# Patient Record
Sex: Female | Born: 1940 | Race: Black or African American | Hispanic: No | Smoking: Never smoker
Health system: Southern US, Community
[De-identification: ages and names within clinical notes are randomized; demographics above are authoritative.]

## PROBLEM LIST (undated history)

## (undated) DIAGNOSIS — I10 Essential (primary) hypertension: Secondary | ICD-10-CM

## (undated) DIAGNOSIS — C50919 Malignant neoplasm of unspecified site of unspecified female breast: Secondary | ICD-10-CM

## (undated) DIAGNOSIS — Z794 Long term (current) use of insulin: Secondary | ICD-10-CM

## (undated) DIAGNOSIS — F32A Depression, unspecified: Secondary | ICD-10-CM

## (undated) DIAGNOSIS — N6459 Other signs and symptoms in breast: Secondary | ICD-10-CM

## (undated) DIAGNOSIS — E785 Hyperlipidemia, unspecified: Secondary | ICD-10-CM

## (undated) DIAGNOSIS — Z923 Personal history of irradiation: Secondary | ICD-10-CM

## (undated) DIAGNOSIS — E119 Type 2 diabetes mellitus without complications: Secondary | ICD-10-CM

## (undated) DIAGNOSIS — D649 Anemia, unspecified: Secondary | ICD-10-CM

## (undated) DIAGNOSIS — F329 Major depressive disorder, single episode, unspecified: Secondary | ICD-10-CM

## (undated) DIAGNOSIS — N189 Chronic kidney disease, unspecified: Secondary | ICD-10-CM

## (undated) DIAGNOSIS — Z9221 Personal history of antineoplastic chemotherapy: Secondary | ICD-10-CM

## (undated) DIAGNOSIS — F419 Anxiety disorder, unspecified: Secondary | ICD-10-CM

## (undated) DIAGNOSIS — K219 Gastro-esophageal reflux disease without esophagitis: Secondary | ICD-10-CM

## (undated) DIAGNOSIS — K635 Polyp of colon: Secondary | ICD-10-CM

## (undated) DIAGNOSIS — IMO0001 Reserved for inherently not codable concepts without codable children: Secondary | ICD-10-CM

## (undated) HISTORY — DX: Anemia, unspecified: D64.9

## (undated) HISTORY — DX: Chronic kidney disease, unspecified: N18.9

## (undated) HISTORY — DX: Essential (primary) hypertension: I10

## (undated) HISTORY — DX: Other signs and symptoms in breast: N64.59

## (undated) HISTORY — DX: Polyp of colon: K63.5

## (undated) HISTORY — DX: Anxiety disorder, unspecified: F41.9

## (undated) HISTORY — DX: Depression, unspecified: F32.A

---
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
## 1998-06-18 ENCOUNTER — Ambulatory Visit (HOSPITAL_COMMUNITY): Admission: RE | Admit: 1998-06-18 | Discharge: 1998-06-18 | Payer: Self-pay | Admitting: Cardiology

## 1998-12-08 ENCOUNTER — Encounter: Admission: RE | Admit: 1998-12-08 | Discharge: 1998-12-08 | Payer: Self-pay | Admitting: Family Medicine

## 1999-01-07 ENCOUNTER — Encounter: Admission: RE | Admit: 1999-01-07 | Discharge: 1999-01-07 | Payer: Self-pay | Admitting: Family Medicine

## 1999-03-24 ENCOUNTER — Encounter: Admission: RE | Admit: 1999-03-24 | Discharge: 1999-03-24 | Payer: Self-pay | Admitting: Family Medicine

## 1999-04-25 ENCOUNTER — Encounter: Admission: RE | Admit: 1999-04-25 | Discharge: 1999-04-25 | Payer: Self-pay | Admitting: Family Medicine

## 1999-06-22 ENCOUNTER — Encounter: Admission: RE | Admit: 1999-06-22 | Discharge: 1999-06-22 | Payer: Self-pay | Admitting: Family Medicine

## 1999-07-22 ENCOUNTER — Encounter: Admission: RE | Admit: 1999-07-22 | Discharge: 1999-07-22 | Payer: Self-pay | Admitting: Family Medicine

## 1999-08-03 ENCOUNTER — Encounter: Admission: RE | Admit: 1999-08-03 | Discharge: 1999-08-03 | Payer: Self-pay | Admitting: Family Medicine

## 1999-08-22 ENCOUNTER — Encounter: Admission: RE | Admit: 1999-08-22 | Discharge: 1999-08-22 | Payer: Self-pay | Admitting: Family Medicine

## 1999-09-30 ENCOUNTER — Encounter: Admission: RE | Admit: 1999-09-30 | Discharge: 1999-09-30 | Payer: Self-pay | Admitting: Family Medicine

## 2000-01-04 ENCOUNTER — Encounter: Admission: RE | Admit: 2000-01-04 | Discharge: 2000-01-04 | Payer: Self-pay | Admitting: *Deleted

## 2000-01-25 ENCOUNTER — Encounter: Payer: Self-pay | Admitting: Emergency Medicine

## 2000-01-25 ENCOUNTER — Emergency Department (HOSPITAL_COMMUNITY): Admission: EM | Admit: 2000-01-25 | Discharge: 2000-01-25 | Payer: Self-pay | Admitting: Emergency Medicine

## 2000-05-24 ENCOUNTER — Encounter: Admission: RE | Admit: 2000-05-24 | Discharge: 2000-05-24 | Payer: Self-pay | Admitting: Family Medicine

## 2000-06-19 ENCOUNTER — Encounter: Admission: RE | Admit: 2000-06-19 | Discharge: 2000-06-19 | Payer: Self-pay | Admitting: *Deleted

## 2000-06-19 ENCOUNTER — Encounter: Payer: Self-pay | Admitting: *Deleted

## 2000-08-10 ENCOUNTER — Encounter: Admission: RE | Admit: 2000-08-10 | Discharge: 2000-08-10 | Payer: Self-pay | Admitting: Family Medicine

## 2000-10-22 ENCOUNTER — Encounter: Admission: RE | Admit: 2000-10-22 | Discharge: 2000-10-22 | Payer: Self-pay | Admitting: Family Medicine

## 2001-01-30 ENCOUNTER — Encounter: Admission: RE | Admit: 2001-01-30 | Discharge: 2001-01-30 | Payer: Self-pay | Admitting: Family Medicine

## 2001-05-29 ENCOUNTER — Encounter: Admission: RE | Admit: 2001-05-29 | Discharge: 2001-05-29 | Payer: Self-pay | Admitting: Family Medicine

## 2001-06-04 ENCOUNTER — Encounter: Admission: RE | Admit: 2001-06-04 | Discharge: 2001-06-04 | Payer: Self-pay | Admitting: Family Medicine

## 2001-06-19 ENCOUNTER — Encounter: Admission: RE | Admit: 2001-06-19 | Discharge: 2001-06-19 | Payer: Self-pay | Admitting: Family Medicine

## 2001-09-18 ENCOUNTER — Encounter: Admission: RE | Admit: 2001-09-18 | Discharge: 2001-09-18 | Payer: Self-pay | Admitting: Family Medicine

## 2001-10-03 ENCOUNTER — Encounter: Payer: Self-pay | Admitting: *Deleted

## 2001-10-03 ENCOUNTER — Encounter: Admission: RE | Admit: 2001-10-03 | Discharge: 2001-10-03 | Payer: Self-pay | Admitting: *Deleted

## 2001-10-17 ENCOUNTER — Encounter: Admission: RE | Admit: 2001-10-17 | Discharge: 2001-10-17 | Payer: Self-pay | Admitting: Family Medicine

## 2001-12-10 ENCOUNTER — Encounter: Admission: RE | Admit: 2001-12-10 | Discharge: 2001-12-10 | Payer: Self-pay | Admitting: Family Medicine

## 2002-01-03 ENCOUNTER — Ambulatory Visit (HOSPITAL_COMMUNITY): Admission: RE | Admit: 2002-01-03 | Discharge: 2002-01-03 | Payer: Self-pay | Admitting: Family Medicine

## 2002-01-03 ENCOUNTER — Encounter: Admission: RE | Admit: 2002-01-03 | Discharge: 2002-01-03 | Payer: Self-pay | Admitting: Family Medicine

## 2002-04-03 ENCOUNTER — Emergency Department (HOSPITAL_COMMUNITY): Admission: EM | Admit: 2002-04-03 | Discharge: 2002-04-03 | Payer: Self-pay | Admitting: Emergency Medicine

## 2002-06-05 ENCOUNTER — Encounter: Admission: RE | Admit: 2002-06-05 | Discharge: 2002-06-05 | Payer: Self-pay | Admitting: Family Medicine

## 2002-07-04 ENCOUNTER — Encounter: Admission: RE | Admit: 2002-07-04 | Discharge: 2002-07-04 | Payer: Self-pay | Admitting: Family Medicine

## 2002-08-02 ENCOUNTER — Emergency Department (HOSPITAL_COMMUNITY): Admission: EM | Admit: 2002-08-02 | Discharge: 2002-08-02 | Payer: Self-pay | Admitting: Emergency Medicine

## 2002-08-06 ENCOUNTER — Encounter: Admission: RE | Admit: 2002-08-06 | Discharge: 2002-08-06 | Payer: Self-pay | Admitting: Family Medicine

## 2002-08-22 ENCOUNTER — Encounter: Admission: RE | Admit: 2002-08-22 | Discharge: 2002-08-22 | Payer: Self-pay | Admitting: Family Medicine

## 2002-12-05 ENCOUNTER — Encounter: Admission: RE | Admit: 2002-12-05 | Discharge: 2002-12-05 | Payer: Self-pay | Admitting: Family Medicine

## 2002-12-17 ENCOUNTER — Encounter: Admission: RE | Admit: 2002-12-17 | Discharge: 2002-12-17 | Payer: Self-pay | Admitting: Sports Medicine

## 2002-12-17 ENCOUNTER — Encounter: Payer: Self-pay | Admitting: Sports Medicine

## 2003-01-12 ENCOUNTER — Encounter: Admission: RE | Admit: 2003-01-12 | Discharge: 2003-01-12 | Payer: Self-pay | Admitting: Family Medicine

## 2003-04-01 ENCOUNTER — Encounter: Admission: RE | Admit: 2003-04-01 | Discharge: 2003-04-01 | Payer: Self-pay | Admitting: Family Medicine

## 2003-06-02 ENCOUNTER — Encounter: Admission: RE | Admit: 2003-06-02 | Discharge: 2003-06-02 | Payer: Self-pay | Admitting: Family Medicine

## 2003-06-26 ENCOUNTER — Encounter: Admission: RE | Admit: 2003-06-26 | Discharge: 2003-06-26 | Payer: Self-pay | Admitting: Family Medicine

## 2003-08-18 ENCOUNTER — Encounter: Admission: RE | Admit: 2003-08-18 | Discharge: 2003-08-18 | Payer: Self-pay | Admitting: Sports Medicine

## 2003-09-14 ENCOUNTER — Encounter: Admission: RE | Admit: 2003-09-14 | Discharge: 2003-09-14 | Payer: Self-pay | Admitting: Family Medicine

## 2003-09-21 ENCOUNTER — Ambulatory Visit (HOSPITAL_COMMUNITY): Admission: RE | Admit: 2003-09-21 | Discharge: 2003-09-21 | Payer: Self-pay | Admitting: Internal Medicine

## 2003-12-07 ENCOUNTER — Encounter: Admission: RE | Admit: 2003-12-07 | Discharge: 2003-12-07 | Payer: Self-pay | Admitting: Family Medicine

## 2004-02-10 ENCOUNTER — Emergency Department (HOSPITAL_COMMUNITY): Admission: EM | Admit: 2004-02-10 | Discharge: 2004-02-10 | Payer: Self-pay | Admitting: Emergency Medicine

## 2004-03-02 ENCOUNTER — Encounter: Admission: RE | Admit: 2004-03-02 | Discharge: 2004-03-02 | Payer: Self-pay | Admitting: Family Medicine

## 2004-03-11 ENCOUNTER — Encounter: Admission: RE | Admit: 2004-03-11 | Discharge: 2004-03-11 | Payer: Self-pay | Admitting: Sports Medicine

## 2004-07-04 ENCOUNTER — Ambulatory Visit: Payer: Self-pay | Admitting: Family Medicine

## 2004-07-19 ENCOUNTER — Ambulatory Visit: Payer: Self-pay | Admitting: Sports Medicine

## 2004-08-12 ENCOUNTER — Ambulatory Visit (HOSPITAL_COMMUNITY): Admission: RE | Admit: 2004-08-12 | Discharge: 2004-08-12 | Payer: Self-pay | Admitting: Gastroenterology

## 2004-08-12 ENCOUNTER — Encounter (INDEPENDENT_AMBULATORY_CARE_PROVIDER_SITE_OTHER): Payer: Self-pay | Admitting: Specialist

## 2004-08-12 LAB — HM COLONOSCOPY

## 2004-09-01 ENCOUNTER — Encounter (INDEPENDENT_AMBULATORY_CARE_PROVIDER_SITE_OTHER): Payer: Self-pay | Admitting: *Deleted

## 2004-09-05 ENCOUNTER — Ambulatory Visit: Payer: Self-pay | Admitting: Family Medicine

## 2004-09-13 ENCOUNTER — Encounter: Payer: Self-pay | Admitting: Family Medicine

## 2004-09-13 ENCOUNTER — Other Ambulatory Visit: Admission: RE | Admit: 2004-09-13 | Discharge: 2004-09-13 | Payer: Self-pay | Admitting: Family Medicine

## 2004-09-13 ENCOUNTER — Ambulatory Visit: Payer: Self-pay | Admitting: Family Medicine

## 2004-09-28 ENCOUNTER — Encounter: Admission: RE | Admit: 2004-09-28 | Discharge: 2004-09-28 | Payer: Self-pay | Admitting: Family Medicine

## 2004-10-10 ENCOUNTER — Ambulatory Visit: Payer: Self-pay | Admitting: Family Medicine

## 2004-11-04 ENCOUNTER — Ambulatory Visit: Payer: Self-pay | Admitting: Family Medicine

## 2005-01-15 ENCOUNTER — Emergency Department (HOSPITAL_COMMUNITY): Admission: EM | Admit: 2005-01-15 | Discharge: 2005-01-15 | Payer: Self-pay | Admitting: Emergency Medicine

## 2005-01-16 ENCOUNTER — Ambulatory Visit: Payer: Self-pay | Admitting: Family Medicine

## 2005-02-20 ENCOUNTER — Ambulatory Visit: Payer: Self-pay | Admitting: Family Medicine

## 2005-05-04 ENCOUNTER — Ambulatory Visit: Payer: Self-pay | Admitting: Sports Medicine

## 2005-05-05 ENCOUNTER — Ambulatory Visit: Payer: Self-pay | Admitting: Family Medicine

## 2005-06-26 ENCOUNTER — Ambulatory Visit: Payer: Self-pay | Admitting: Family Medicine

## 2005-07-28 ENCOUNTER — Ambulatory Visit: Payer: Self-pay | Admitting: Family Medicine

## 2005-08-18 ENCOUNTER — Ambulatory Visit: Payer: Self-pay | Admitting: Family Medicine

## 2005-11-16 ENCOUNTER — Encounter: Admission: RE | Admit: 2005-11-16 | Discharge: 2005-11-16 | Payer: Self-pay | Admitting: Internal Medicine

## 2005-11-17 ENCOUNTER — Ambulatory Visit: Payer: Self-pay | Admitting: Sports Medicine

## 2005-12-11 ENCOUNTER — Ambulatory Visit: Payer: Self-pay | Admitting: Family Medicine

## 2006-01-31 ENCOUNTER — Encounter: Payer: Self-pay | Admitting: Cardiology

## 2006-03-01 ENCOUNTER — Ambulatory Visit: Payer: Self-pay | Admitting: Family Medicine

## 2006-07-02 ENCOUNTER — Ambulatory Visit: Payer: Self-pay | Admitting: Sports Medicine

## 2006-07-05 ENCOUNTER — Ambulatory Visit: Payer: Self-pay | Admitting: Family Medicine

## 2006-07-05 ENCOUNTER — Ambulatory Visit (HOSPITAL_COMMUNITY): Admission: RE | Admit: 2006-07-05 | Discharge: 2006-07-05 | Payer: Self-pay | Admitting: Family Medicine

## 2006-07-07 ENCOUNTER — Emergency Department (HOSPITAL_COMMUNITY): Admission: EM | Admit: 2006-07-07 | Discharge: 2006-07-07 | Payer: Self-pay | Admitting: Family Medicine

## 2006-07-15 ENCOUNTER — Observation Stay (HOSPITAL_COMMUNITY): Admission: EM | Admit: 2006-07-15 | Discharge: 2006-07-16 | Payer: Self-pay | Admitting: Emergency Medicine

## 2006-07-15 ENCOUNTER — Ambulatory Visit: Payer: Self-pay | Admitting: Sports Medicine

## 2006-07-20 ENCOUNTER — Ambulatory Visit: Payer: Self-pay | Admitting: Family Medicine

## 2006-08-17 ENCOUNTER — Ambulatory Visit (HOSPITAL_COMMUNITY): Admission: RE | Admit: 2006-08-17 | Discharge: 2006-08-17 | Payer: Self-pay | Admitting: Sports Medicine

## 2006-08-17 ENCOUNTER — Ambulatory Visit: Payer: Self-pay | Admitting: Internal Medicine

## 2006-08-17 ENCOUNTER — Encounter: Payer: Self-pay | Admitting: Internal Medicine

## 2006-08-17 ENCOUNTER — Ambulatory Visit: Payer: Self-pay | Admitting: Sports Medicine

## 2006-09-11 ENCOUNTER — Ambulatory Visit: Payer: Self-pay | Admitting: Sports Medicine

## 2006-11-29 DIAGNOSIS — I871 Compression of vein: Secondary | ICD-10-CM

## 2006-11-29 DIAGNOSIS — I10 Essential (primary) hypertension: Secondary | ICD-10-CM

## 2006-11-29 DIAGNOSIS — E669 Obesity, unspecified: Secondary | ICD-10-CM

## 2006-11-29 DIAGNOSIS — E1159 Type 2 diabetes mellitus with other circulatory complications: Secondary | ICD-10-CM

## 2006-11-29 DIAGNOSIS — L2089 Other atopic dermatitis: Secondary | ICD-10-CM

## 2006-11-29 DIAGNOSIS — R12 Heartburn: Secondary | ICD-10-CM | POA: Insufficient documentation

## 2006-11-29 DIAGNOSIS — IMO0002 Reserved for concepts with insufficient information to code with codable children: Secondary | ICD-10-CM | POA: Insufficient documentation

## 2006-11-29 DIAGNOSIS — E1169 Type 2 diabetes mellitus with other specified complication: Secondary | ICD-10-CM | POA: Insufficient documentation

## 2006-11-29 DIAGNOSIS — E785 Hyperlipidemia, unspecified: Secondary | ICD-10-CM

## 2006-11-29 DIAGNOSIS — E1165 Type 2 diabetes mellitus with hyperglycemia: Secondary | ICD-10-CM

## 2006-11-29 HISTORY — DX: Essential (primary) hypertension: I10

## 2006-11-30 ENCOUNTER — Encounter (INDEPENDENT_AMBULATORY_CARE_PROVIDER_SITE_OTHER): Payer: Self-pay | Admitting: *Deleted

## 2007-02-05 ENCOUNTER — Ambulatory Visit: Payer: Self-pay | Admitting: Family Medicine

## 2007-02-05 ENCOUNTER — Encounter (INDEPENDENT_AMBULATORY_CARE_PROVIDER_SITE_OTHER): Payer: Self-pay | Admitting: Family Medicine

## 2007-02-05 LAB — CONVERTED CEMR LAB
Calcium: 10.1 mg/dL (ref 8.4–10.5)
Hgb A1c MFr Bld: >14 %
Sodium: 135 meq/L (ref 135–145)

## 2007-02-20 ENCOUNTER — Encounter (INDEPENDENT_AMBULATORY_CARE_PROVIDER_SITE_OTHER): Payer: Self-pay | Admitting: *Deleted

## 2007-07-03 ENCOUNTER — Ambulatory Visit: Payer: Self-pay | Admitting: Family Medicine

## 2007-07-05 ENCOUNTER — Encounter (INDEPENDENT_AMBULATORY_CARE_PROVIDER_SITE_OTHER): Payer: Self-pay | Admitting: Family Medicine

## 2007-07-05 ENCOUNTER — Ambulatory Visit: Payer: Self-pay | Admitting: Family Medicine

## 2007-07-08 ENCOUNTER — Encounter (INDEPENDENT_AMBULATORY_CARE_PROVIDER_SITE_OTHER): Payer: Self-pay | Admitting: Family Medicine

## 2007-07-08 LAB — CONVERTED CEMR LAB
Cholesterol: 275 mg/dL — ABNORMAL HIGH (ref 0–200)
HDL: 62 mg/dL (ref >39)
Total CHOL/HDL Ratio: 4.4

## 2007-07-31 ENCOUNTER — Ambulatory Visit: Payer: Self-pay | Admitting: Family Medicine

## 2007-08-28 ENCOUNTER — Ambulatory Visit: Payer: Self-pay | Admitting: Family Medicine

## 2007-08-28 ENCOUNTER — Encounter (INDEPENDENT_AMBULATORY_CARE_PROVIDER_SITE_OTHER): Payer: Self-pay | Admitting: Family Medicine

## 2007-08-28 DIAGNOSIS — R011 Cardiac murmur, unspecified: Secondary | ICD-10-CM

## 2007-08-31 LAB — CONVERTED CEMR LAB
C-Peptide: 0.83 ng/mL (ref 0.80–3.90)
Hemoglobin: 15.5 g/dL — ABNORMAL HIGH (ref 12.0–15.0)
MCHC: 34.5 g/dL (ref 30.0–36.0)
MCV: 81.8 fL (ref 78.0–100.0)
RBC: 5.49 M/uL — ABNORMAL HIGH (ref 3.87–5.11)

## 2007-10-06 ENCOUNTER — Emergency Department (HOSPITAL_COMMUNITY): Admission: EM | Admit: 2007-10-06 | Discharge: 2007-10-06 | Payer: Self-pay | Admitting: Emergency Medicine

## 2007-10-08 ENCOUNTER — Ambulatory Visit: Payer: Self-pay | Admitting: Family Medicine

## 2007-10-08 DIAGNOSIS — S92919A Unspecified fracture of unspecified toe(s), initial encounter for closed fracture: Secondary | ICD-10-CM | POA: Insufficient documentation

## 2007-12-09 ENCOUNTER — Ambulatory Visit: Payer: Self-pay | Admitting: Family Medicine

## 2007-12-16 ENCOUNTER — Encounter: Payer: Self-pay | Admitting: Family Medicine

## 2007-12-16 ENCOUNTER — Ambulatory Visit: Payer: Self-pay | Admitting: Family Medicine

## 2007-12-16 LAB — CONVERTED CEMR LAB
HDL: 55 mg/dL (ref >39)
LDL Cholesterol: 82 mg/dL (ref 0–99)
Triglycerides: 220 mg/dL — ABNORMAL HIGH (ref <150)
VLDL: 44 mg/dL — ABNORMAL HIGH (ref 0–40)

## 2008-02-03 ENCOUNTER — Encounter: Admission: RE | Admit: 2008-02-03 | Discharge: 2008-02-03 | Payer: Self-pay | Admitting: Family Medicine

## 2008-02-21 ENCOUNTER — Encounter: Admission: RE | Admit: 2008-02-21 | Discharge: 2008-02-21 | Payer: Self-pay | Admitting: Family Medicine

## 2008-04-13 ENCOUNTER — Ambulatory Visit: Payer: Self-pay | Admitting: Sports Medicine

## 2008-04-13 ENCOUNTER — Encounter: Payer: Self-pay | Admitting: Family Medicine

## 2008-04-13 DIAGNOSIS — N63 Unspecified lump in unspecified breast: Secondary | ICD-10-CM

## 2008-04-13 DIAGNOSIS — F17200 Nicotine dependence, unspecified, uncomplicated: Secondary | ICD-10-CM | POA: Insufficient documentation

## 2008-04-13 LAB — CONVERTED CEMR LAB: HDL goal, serum: 40 mg/dL

## 2008-04-14 LAB — CONVERTED CEMR LAB: Pap Smear: NORMAL

## 2008-04-29 ENCOUNTER — Telehealth (INDEPENDENT_AMBULATORY_CARE_PROVIDER_SITE_OTHER): Payer: Self-pay | Admitting: Family Medicine

## 2008-05-04 ENCOUNTER — Encounter: Admission: RE | Admit: 2008-05-04 | Discharge: 2008-05-04 | Payer: Self-pay | Admitting: Surgery

## 2008-05-05 ENCOUNTER — Encounter: Admission: RE | Admit: 2008-05-05 | Discharge: 2008-05-05 | Payer: Self-pay | Admitting: Surgery

## 2008-05-05 ENCOUNTER — Ambulatory Visit (HOSPITAL_BASED_OUTPATIENT_CLINIC_OR_DEPARTMENT_OTHER): Admission: RE | Admit: 2008-05-05 | Discharge: 2008-05-05 | Payer: Self-pay | Admitting: Surgery

## 2008-05-05 ENCOUNTER — Encounter (INDEPENDENT_AMBULATORY_CARE_PROVIDER_SITE_OTHER): Payer: Self-pay | Admitting: Surgery

## 2008-05-05 HISTORY — PX: BREAST LUMPECTOMY: SHX2

## 2008-07-06 ENCOUNTER — Ambulatory Visit: Payer: Self-pay | Admitting: Family Medicine

## 2008-08-17 ENCOUNTER — Ambulatory Visit: Payer: Self-pay | Admitting: Family Medicine

## 2008-10-09 ENCOUNTER — Telehealth: Payer: Self-pay | Admitting: *Deleted

## 2009-06-02 ENCOUNTER — Ambulatory Visit: Payer: Self-pay | Admitting: Family Medicine

## 2009-06-02 LAB — CONVERTED CEMR LAB: Hgb A1c MFr Bld: 12.3 %

## 2009-08-30 ENCOUNTER — Ambulatory Visit: Payer: Self-pay | Admitting: Family Medicine

## 2009-09-01 ENCOUNTER — Encounter (INDEPENDENT_AMBULATORY_CARE_PROVIDER_SITE_OTHER): Payer: Self-pay

## 2009-09-03 ENCOUNTER — Encounter: Payer: Self-pay | Admitting: Family Medicine

## 2009-09-03 ENCOUNTER — Ambulatory Visit: Payer: Self-pay | Admitting: Family Medicine

## 2009-09-05 LAB — CONVERTED CEMR LAB
AST: 11 units/L (ref 0–37)
Albumin: 4.1 g/dL (ref 3.5–5.2)
Alkaline Phosphatase: 91 units/L (ref 39–117)
BUN: 17 mg/dL (ref 6–23)
Creatinine, Ser: 0.95 mg/dL (ref 0.40–1.20)
HDL: 61 mg/dL (ref >39)
LDL Cholesterol: 180 mg/dL — ABNORMAL HIGH (ref 0–99)
Potassium: 4.3 meq/L (ref 3.5–5.3)
Total Bilirubin: 0.4 mg/dL (ref 0.3–1.2)
Total CHOL/HDL Ratio: 4.4
VLDL: 25 mg/dL (ref 0–40)

## 2009-12-23 ENCOUNTER — Ambulatory Visit: Payer: Self-pay | Admitting: Family Medicine

## 2010-03-03 ENCOUNTER — Ambulatory Visit: Payer: Self-pay | Admitting: Family Medicine

## 2010-03-03 LAB — CONVERTED CEMR LAB: Hgb A1c MFr Bld: 11.1 %

## 2010-06-07 ENCOUNTER — Encounter (INDEPENDENT_AMBULATORY_CARE_PROVIDER_SITE_OTHER): Payer: Self-pay | Admitting: Neurology

## 2010-06-07 ENCOUNTER — Ambulatory Visit: Admission: RE | Admit: 2010-06-07 | Discharge: 2010-06-07 | Payer: Self-pay | Admitting: Neurology

## 2010-06-15 ENCOUNTER — Ambulatory Visit: Payer: Self-pay | Admitting: Family Medicine

## 2010-09-06 ENCOUNTER — Telehealth: Payer: Self-pay | Admitting: Family Medicine

## 2010-09-15 ENCOUNTER — Encounter: Payer: Self-pay | Admitting: Family Medicine

## 2010-09-15 ENCOUNTER — Ambulatory Visit: Payer: Self-pay | Admitting: Family Medicine

## 2010-09-21 ENCOUNTER — Ambulatory Visit: Payer: Self-pay | Admitting: Family Medicine

## 2010-10-17 ENCOUNTER — Telehealth: Payer: Self-pay | Admitting: Family Medicine

## 2010-10-23 ENCOUNTER — Encounter: Payer: Self-pay | Admitting: Family Medicine

## 2010-10-26 ENCOUNTER — Encounter (INDEPENDENT_AMBULATORY_CARE_PROVIDER_SITE_OTHER): Payer: Self-pay | Admitting: *Deleted

## 2010-11-01 NOTE — Progress Notes (Signed)
Summary: resch  Phone Note Call from Patient   Caller: Patient Summary of Call: pt called to resch her appt today-  Initial call taken by: Audie Clear,  September 06, 2010 10:38 AM

## 2010-11-01 NOTE — Assessment & Plan Note (Signed)
Summary: f/up,tcb   Vital Signs:  Patient profile:   70 year old female Height:      61.75 inches Weight:      187 pounds BMI:     34.60 BSA:     1.85 Temp:     97.9 degrees F Pulse rate:   92 / minute BP sitting:   145 / 83  Vitals Entered By: Christen Bame CMA (March 03, 2010 11:42 AM) CC: f/u Is Patient Diabetic? Yes Did you bring your meter with you today? No Pain Assessment Patient in pain? no        Primary Care Provider:  Mariana Arn  MD  CC:  f/u.  History of Present Illness: 1) HTN - 145/83.  Denies any CP, HA, SOB, or blurred vision or LE edema. Taking metoprolol, norvasc, clonidine, lisinopril, HCTZ w/o side effects. Does not monitor salt intake, walks 3 minuteson treadmill 3-4 times per week at most.  2)  DM - Fasting CBGs 80's-200's (checks first thing in the morning).  Takes 50 units Lantus qAM, though she should be on 60 units - I have repeatedly told her this.  Also takes Metformin and Glipizide but adherence has, and continues to be an issue.  A1C 12.3 at last visit, 11.1 today.  Has been referred to Dr. Valentina Lucks for pharmacy clinic on two occasions but did not make appointment either time.  Walks 15-20 minutes 2 x week. Likes fried foods and sweets - says it will be too expensive to change her diet and eat "diet foods". Today denies polyuria, polydipsia, vision change. Reports that she saw the opthalmologist 1 year ago and had a "good report"  3) HLD: On statin, but has not taken in at least one month. . Last FLP 03/09 - total cholesterol 181, trig 220, HDL 55, LDL 82. Likes fried foods and sweets.   Habits & Providers  Alcohol-Tobacco-Diet     Tobacco Status: never  Current Medications (verified): 1)  Albuterol 90 Mcg/act Aers (Albuterol) .... Inhale 2 Puff Using Inhaler Four Times A Day 2)  Aspirin Ec 81 Mg Tbec (Aspirin) .... Take 1 Tablet By Mouth Every Morning 3)  Clonidine Hcl 0.2 Mg Tabs (Clonidine Hcl) .... Take 1 Tablet By Mouth Twice A Day 4)   Metformin Hcl 500 Mg Tabs (Metformin Hcl) .... Take 2 Tablet By Mouth Twice A Day 5)  Metoprolol Succinate 100 Mg Tb24 (Metoprolol Succinate) .Marland Kitchen.. 1 Tablet By Mouth Once A Day 6)  Norvasc 10 Mg Tabs (Amlodipine Besylate) .... Take 1 Tablet By Mouth Once A Day 7)  Glucotrol 10 Mg  Tabs (Glipizide) .Marland Kitchen.. 1 Tab By Mouth Two Times A Day 8)  Pravastatin Sodium 40 Mg  Tabs (Pravastatin Sodium) .... One By Mouth At Bedtime 9)  Lantus 100 Unit/ml Soln (Insulin Glargine) .... 60 Units Q Am. 10)  Lisinopril 40 Mg Tabs (Lisinopril) .Marland Kitchen.. 1 Tab By Mouth Daily. 11)  Hydrochlorothiazide 25 Mg Tabs (Hydrochlorothiazide) .Marland Kitchen.. 1 Tab By Mouth Daily. 12)  Calcium Carbonate-Vitamin D 600-400 Mg-Unit Tabs (Calcium Carbonate-Vitamin D) .Marland Kitchen.. 1 Tab By Mouth Daily.  Allergies (verified): No Known Drug Allergies  Review of Systems       as per HPI   Physical Exam  General:  Vitals reviewed. Hypertensive Well apearing, obese, pleasant  Eyes:  vision grossly intact, pupils equal, pupils round, pupils reactive to light, pupils react to accomodation, no injection, no optic disk abnormalities however hypertensive changes noted . no hemorrhages/ exudates. Neck:  no  JVD and no carotid bruits.   Lungs:  Normal respiratory effort, chest expands symmetrically. Lungs are clear to auscultation, no crackles or wheezes. Heart:  RRR,no murmurs no gallop, no rub, no JVD, no LE edema  Diabetes Management Exam:    Foot Exam (with socks and/or shoes not present):       Sensory-Pinprick/Light touch:          Left medial foot (L-4): normal          Left dorsal foot (L-5): normal          Left lateral foot (S-1): normal          Right medial foot (L-4): normal          Right dorsal foot (L-5): normal          Right lateral foot (S-1): normal       Sensory-Monofilament:          Left foot: normal          Right foot: normal       Inspection:          Left foot: normal          Right foot: normal       Nails:          Left  foot: normal          Right foot: normal   Impression & Recommendations:  Problem # 1:  HYPERTENSION, BENIGN SYSTEMIC (ICD-401.1)  Somewhat improved. History of poor medication adherence - I believe that Wendy [name] is actually taking her blood pressure medications as prescribed now. DASH diet reviewed. Increasing activity discussed.   Her updated medication list for this problem includes:    Clonidine Hcl 0.2 Mg Tabs (Clonidine hcl) .Marland Kitchen... Take 1 tablet by mouth twice a day    Metoprolol Succinate 100 Mg Tb24 (Metoprolol succinate) .Marland Kitchen... 1 tablet by mouth once a day    Norvasc 10 Mg Tabs (Amlodipine besylate) .Marland Kitchen... Take 1 tablet by mouth once a day    Lisinopril 40 Mg Tabs (Lisinopril) .Marland Kitchen... 1 tab by mouth daily.    Hydrochlorothiazide 25 Mg Tabs (Hydrochlorothiazide) .Marland Kitchen... 1 tab by mouth daily.  BP today: 145/83 Prior BP: 161/73 (08/30/2009)  Prior 10 Yr Risk Heart Disease: 24 % (12/09/2007)  Labs Reviewed: K+: 4.3 (09/03/2009) Creat: : 0.95 (09/03/2009)   Chol: 266 (09/03/2009)   HDL: 61 (09/03/2009)   LDL: 180 (09/03/2009)   TG: 123 (09/03/2009)  Orders: [place]- Est  Level 4 [id number])  Problem # 2:  DIABETES MELLITUS, II, COMPLICATIONS ([id number]) Assessment: Unchanged Somewhat improved. Advised to take 60 units Lantus as she should be doing. Will follow in three months. Diet and exercise reviewed.   Her updated medication list for this problem includes:    Aspirin Ec 81 Mg Tbec (Aspirin) .Marland Kitchen... Take 1 tablet by mouth every morning    Metformin Hcl 500 Mg Tabs (Metformin hcl) .Marland Kitchen... Take 2 tablet by mouth twice a day    Glucotrol 10 Mg Tabs (Glipizide) .Marland Kitchen... 1 tab by mouth two times a day    Lantus 100 Unit/ml Soln (Insulin glargine) .Marland KitchenMarland KitchenMarland KitchenMarland Kitchen 60 units q am.    Lisinopril 40 Mg Tabs (Lisinopril) .Marland Kitchen... 1 tab by mouth daily.  Orders: [facility] [id number]) [place]- Est  Level 4 [id number])  Problem # 3:  HYPERLIPIDEMIA (B2193296.4)  Continue pravastatin as below.   Her updated medication list  for this problem includes:    Pravastatin Sodium 40 Mg Tabs (Pravastatin sodium) .Marland KitchenMarland KitchenMarland KitchenMarland Kitchen  One by mouth at bedtime  Orders: [place]- Est  Level 4 [id number])  Complete Medication List: 1)  Albuterol 90 Mcg/act Aers (Albuterol) .... Inhale 2 puff using inhaler four times a day 2)  Aspirin Ec 81 Mg Tbec (Aspirin) .... Take 1 tablet by mouth every morning 3)  Clonidine Hcl 0.2 Mg Tabs (Clonidine hcl) .... Take 1 tablet by mouth twice a day 4)  Metformin Hcl 500 Mg Tabs (Metformin hcl) .... Take 2 tablet by mouth twice a day 5)  Metoprolol Succinate 100 Mg Tb24 (Metoprolol succinate) .Marland Kitchen.. 1 tablet by mouth once a day 6)  Norvasc 10 Mg Tabs (Amlodipine besylate) .... Take 1 tablet by mouth once a day 7)  Glucotrol 10 Mg Tabs (Glipizide) .Marland Kitchen.. 1 tab by mouth two times a day 8)  Pravastatin Sodium 40 Mg Tabs (Pravastatin sodium) .... One by mouth at bedtime 9)  Lantus 100 Unit/ml Soln (Insulin glargine) .... 60 units q am. 10)  Lisinopril 40 Mg Tabs (Lisinopril) .Marland Kitchen.. 1 tab by mouth daily. 11)  Hydrochlorothiazide 25 Mg Tabs (Hydrochlorothiazide) .Marland Kitchen.. 1 tab by mouth daily. 12)  Calcium Carbonate-vitamin D 600-400 Mg-unit Tabs (Calcium carbonate-vitamin d) .Marland Kitchen.. 1 tab by mouth daily.  Patient Instructions: 1)  It was great to see you today!  2)  MAKE an appointment with your eye doctor soon.  3)  Take all of your medications as instructed. I have refilled the medications that you needed.  4)  Check your blood sugar first thing in the morning each day.  5)  Follow up in three months with me. 6)  TAKE 60 units of Lantus each morning.  Prescriptions: PRAVASTATIN SODIUM 40 MG  TABS (PRAVASTATIN SODIUM) one by mouth at bedtime  #90 Tablet x 1   Entered and Authorized by:   Mariana Arn  MD   Signed by:   Mariana Arn  MD on 03/03/2010   Method used:   Electronically to        [facility] [phone]* (retail)       [place], [place]  [id number]       Ph:  [phone] or [phone]       Fax: [phone]   RxID:   [id number] LANTUS 100 UNIT/ML SOLN (INSULIN GLARGINE) 60 units q am.  #1 bottle x 12   Entered and Authorized by:   Mariana Arn  MD   Signed by:   Mariana Arn  MD on 03/03/2010   Method used:   Electronically to        [facility] [phone]* (retail)       [place], [place]  [id number]       Ph: [phone] or [phone]       Fax: [phone]   RxID:   [id number] GLUCOTROL 10 MG  TABS (GLIPIZIDE) 1 tab by mouth two times a day  #60 Tablet x 2   Entered and Authorized by:   Mariana Arn  MD   Signed by:   Mariana Arn  MD on 03/03/2010   Method used:   Electronically to        [facility] [phone]* (retail)       [place]  [place], [place]  [id number]       Ph: [phone] or [phone]       Fax: [phone]   [id number] METOPROLOL SUCCINATE 100 MG TB24 (METOPROLOL SUCCINATE) 1 tablet by mouth once a day  #30 x 3   Entered and Authorized by:   Mariana Arn  MD   Signed by:   Mariana Arn  MD on 03/03/2010   Method used:   Electronically to        [facility] [phone]* (retail)       [place], [place]  [id number]       Ph: [phone] or [phone]       Fax: [phone]   RxID:   [id number] METFORMIN HCL 500 MG TABS (METFORMIN HCL) Take 2 tablet by mouth twice a day  #60 x 3   Entered and Authorized by:   Mariana Arn  MD   Signed by:   Mariana Arn  MD on 03/03/2010   Method used:   Electronically to        [facility] [phone]* (retail)       [place], [place]  [id number]       Ph: [phone] or [phone]       Fax: [phone]   RxID:   [id number]   Laboratory Results   Blood Tests   Date/Time Received: March 03, 2010 11:32 AM  Date/Time Reported: March 03, 2010  11:43 AM   HGBA1C: 11.1%   (Normal Range: Non-Diabetic - 3-6%   Control Diabetic - 6-8%)  Comments: ...............test performed by......Marland KitchenBonnie A. Martinique, MLS (ASCP)cm      Prevention & Chronic Care Immunizations   Influenza vaccine: Fluvax Non-MCR  (08/30/2009)   Influenza vaccine due: 06/02/2010    Tetanus booster: 03/03/1999: Done.   Tetanus booster due: 03/02/2009    Pneumococcal vaccine: Done.  (08/03/1999)   Pneumococcal vaccine due: None    H. zoster vaccine: Not documented  Colorectal Screening   Hemoccult: Done.  (07/07/2005)   Hemoccult due: Not Indicated    Colonoscopy: Done.  (08/02/2004)   Colonoscopy due: 08/02/2014  Other Screening   Pap smear: normal  (04/14/2008)   Pap smear due: 04/14/2009    Mammogram: abnormal  (02/21/2008)   Mammogram due: 02/20/2009    DXA bone density scan: Not documented   Smoking status: never  (03/03/2010)  Diabetes Mellitus   HgbA1C: 11.1  (03/03/2010)   Hemoglobin A1C due: 12/01/2009    Eye exam: Not documented    Foot exam: yes  (03/03/2010)   High risk foot: Not documented   Foot care education: Not documented   Foot exam due: 12/01/2009    Urine microalbumin/creatinine ratio: Not documented   Urine microalbumin/cr due: 12/15/2008    Diabetes flowsheet reviewed?: Yes   Progress toward A1C goal: Improved  Lipids   Total Cholesterol: 266  (09/03/2009)   LDL: 180  (09/03/2009)   LDL Direct: Not documented   HDL: 61  (09/03/2009)   Triglycerides: 123  (09/03/2009)   Lipid panel due: 08/31/2010    SGOT (AST): 11  (09/03/2009)   SGPT (ALT): 9  (09/03/2009)   Alkaline phosphatase: 91  (09/03/2009)   Total bilirubin: 0.4  (09/03/2009)   Liver panel due: 12/01/2009  Lipid flowsheet reviewed?: Yes   Progress toward LDL goal: Deteriorated  Hypertension   Last Blood Pressure: 145 / 83  (03/03/2010)   Serum creatinine: 0.95  (09/03/2009)   Serum potassium 4.3  ([id number])   Basic metabolic panel  due: 0000000    Hypertension flowsheet reviewed?: Yes   Progress toward BP goal: Improved  Self-Management Support :   Personal Goals (by the next clinic visit) :     Personal A1C goal: 9  (08/31/2009)     Personal blood pressure goal: 130/80  (08/31/2009)     Personal LDL goal: 70  (08/31/2009)    Patient will work on the following items until the next clinic visit to reach self-care goals:     Medications and monitoring: take my medicines every day, check my blood sugar, check my blood pressure, bring all of my medications to every visit, weigh myself weekly, examine my feet every day  (03/03/2010)     Eating: drink diet soda or water instead of juice or soda, eat more vegetables, use fresh or frozen vegetables, eat foods that are low in salt, eat baked foods instead of fried foods, eat fruit for snacks and desserts, limit or avoid alcohol  (03/03/2010)     Activity: take a 30 minute walk every day  (03/03/2010)    Diabetes self-management support: Written self-care plan  (03/03/2010)   Diabetes care plan printed    Hypertension self-management support: Written self-care plan  (03/03/2010)   Hypertension self-care plan printed.    Lipid self-management support: Written self-care plan  (03/03/2010)   Lipid self-care plan printed.

## 2010-11-01 NOTE — Assessment & Plan Note (Signed)
Summary: np nutrition/carew,df   Vital Signs:  Patient profile:   70 year old female Height:      61.75 inches Weight:      188.5 pounds BMI:     34.88  Vitals Entered By: Iver Nestle PHD (December 23, 2009 9:44 AM)  Primary Care Provider:  Mariana Arn  MD   History of Present Illness: Assessment: Spent 60 min with pt.  Usual eating pattern includes 3 meals and variable snacks.  Favorite foods include veg's, chx, fish, starches, & sweets. Intake at least 5 X wk includes collards or other greens, chx, rice, diet decaf tea.  Drinks 0-16 oz diet sodas/wk.  Wendy [name] has a treadmill at home, which she uses  ~5 min 2 X day.  She walks outside  ~20 min 2 X wk.  Usual FBG is 250-300, and occasionally 400's. She said she can't understand why her BG isn't better controlled.  24-hr recall suggests kcal intake of 1200-1300: B (8 AM)- 1 boiled egg, sausage patty, 1 pc whwht toast, 1 c tea w/ swt & low; L (1 PM)- 1 [employer] mac & chs, 1/2 c grn beans, 1 peach, diet soda; D (6 PM)-  1 svng lasagne, salad w/ <1 oz chs & 1 T l-f ranch, water.  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  She did agree that she could take the baby in the stroller now that the weather is nice.    Nutrition Diagnosis:  Physical inactivity (NB-2.1) related to perceived time constraints as evidenced by walking limited to 20 min 2 X wk plus 5 min 2 X day.  Excessive energy intake (NI-1.5) related to expenditure as evidenced by BMI of almost 35.    Intervention: See Patient Instructions.    Monitoring/Eval: Dietary intake, body weight, and exercise at 3-4-wk F/U.    Allergies: No Known Drug Allergies   Complete Medication List: 1)  Albuterol 90 Mcg/act Aers (Albuterol) .... Inhale 2 puff using inhaler four times a day 2)  Aspirin Ec 81 Mg Tbec (Aspirin) .... Take 1 tablet by mouth every morning 3)  Clonidine Hcl 0.2 Mg Tabs (Clonidine hcl) .... Take 1 tablet by mouth twice  a day 4)  Metformin Hcl 500 Mg Tabs (Metformin hcl) .... Take 2 tablet by mouth twice a day 5)  Metoprolol Succinate 100 Mg Tb24 (Metoprolol succinate) .Marland Kitchen.. 1 tablet by mouth once a day 6)  Norvasc 10 Mg Tabs (Amlodipine besylate) .... Take 1 tablet by mouth once a day 7)  Glucotrol 10 Mg Tabs (Glipizide) .Marland Kitchen.. 1 tab by mouth two times a day 8)  Pravastatin Sodium 40 Mg Tabs (Pravastatin sodium) .... One by mouth at bedtime 9)  Lantus 100 Unit/ml Soln (Insulin glargine) .... 60 units q am. 10)  Lisinopril 40 Mg Tabs (Lisinopril) .Marland Kitchen.. 1 tab by mouth daily. 11)  Hydrochlorothiazide 25 Mg Tabs (Hydrochlorothiazide) .Marland Kitchen.. 1 tab by mouth daily. 12)  Calcium Carbonate-vitamin D 600-400 Mg-unit Tabs (Calcium carbonate-vitamin d) .Marland Kitchen.. 1 tab by mouth daily.  Other Orders: Inital Assessment Each 16min - [facility] [phone])  Patient Instructions: 1)  As your body gets used to lower blood sugar levels, your appetite will adjust so it's easier to not overeat.   2)  Eating when you are NOT hungry:  Recognize that urges to eat do not last forever.  Try to delay eating, to distract yourself, or to distance yourself from food.   3)  Sasturated fat  makes your inlulin work less well, so try to limit saturated fat (animal fats).   4)  TASTE PREFERENCES ARE LEARNED.   5)  No more than 5 hours between eating.  6)  Obtain twice as many veg's as protein or carbohydrate foods for both lunch and dinner.  7)  LIMIT CARBOHYDRATE FOODS TO TWO AT [place].  8)  Walking or other exercise:  30 min 5 X wk.  (And remember exercise opportunities throughout the day.) 9)  Schedule a follow-up appt with Dr. Jenne Campus for 3-4 weeks.

## 2010-11-01 NOTE — Assessment & Plan Note (Signed)
Summary: f/u,df   Vital Signs:  Patient profile:   70 year old female Height:      61.75 inches Weight:      185 pounds BMI:     34.23 BSA:     1.85 Temp:     98.4 degrees F Pulse rate:   92 / minute BP sitting:   150 / 88  Vitals Entered By: Christen Bame CMA (June 15, 2010 2:04 PM) CC: f/u Is Patient Diabetic? Yes Did you bring your meter with you today? No Pain Assessment Patient in pain? no        Primary Care Provider:  Mariana Arn  MD  CC:  f/u.  History of Present Illness: 1) HTN - 154/84.  Has been out of lisinopril for months. Did not call for refill. Denies any CP, HA, SOB, or blurred vision or LE edema. On metoprolol, norvasc, clonidine, lisinopril, HCTZ w/o side effects. Does not monitor salt intake, walks three minutes on treadmill 2 x a month at most.   2)  DM - Fasting CBGs 140-240 (checks first thing in the morning).  Takes 60 units Lantus qAM. Out of metformin "for a while now". Issues with being able to purchase medications until the end of the month.  On Metformin and Glipizide but adherence has, and continues to be an issue.  A1C 11.1 at last visit, 12.4 today.  Has been referred to Dr. Valentina Lucks for pharmacy clinic on two occasions but did not make appointment either time.  Likes fried foods and sweets - snacks are cupcakes, brownies". Likes potatoes. States that she has difficulties with controlling portion size. Would like to change behaviors because she "wants to live and be there for her family"  but says it will be too expensive to change her diet and eat "diet foods". Today denies polyuria, polydipsia, vision change. Reports that she saw the opthalmologist 1 year ago and had a "good report"  3) HLD: On statin but adherence issues as with all medications  FLP 12/10 - trig 123, HDL 61, LDL 180  FLP 03/09 - total cholesterol 181, trig 220, HDL 55, LDL 82. Likes fried foods and sweets.   Med rec as per history and as per prior meds below - difficulty to  accurately gauge how well she is adhereing to her medication regimen, but states that she takes her Lantus "every day"   Habits & Providers  Alcohol-Tobacco-Diet     Tobacco Status: never  Medications Prior to Update: 1)  Albuterol 90 Mcg/act Aers (Albuterol) .... Inhale 2 Puff Using Inhaler Four Times A Day 2)  Aspirin Ec 81 Mg Tbec (Aspirin) .... Take 1 Tablet By Mouth Every Morning 3)  Clonidine Hcl 0.2 Mg Tabs (Clonidine Hcl) .... Take 1 Tablet By Mouth Twice A Day 4)  Metformin Hcl 500 Mg Tabs (Metformin Hcl) .... Take 2 Tablet By Mouth Twice A Day 5)  Metoprolol Succinate 100 Mg Tb24 (Metoprolol Succinate) .Marland Kitchen.. 1 Tablet By Mouth Once A Day 6)  Norvasc 10 Mg Tabs (Amlodipine Besylate) .... Take 1 Tablet By Mouth Once A Day 7)  Glucotrol 10 Mg  Tabs (Glipizide) .Marland Kitchen.. 1 Tab By Mouth Two Times A Day 8)  Pravastatin Sodium 40 Mg  Tabs (Pravastatin Sodium) .... One By Mouth At Bedtime 9)  Lantus 100 Unit/ml Soln (Insulin Glargine) .... 60 Units Q Am. 10)  Lisinopril 40 Mg Tabs (Lisinopril) .Marland Kitchen.. 1 Tab By Mouth Daily. 11)  Hydrochlorothiazide 25 Mg Tabs (Hydrochlorothiazide) .Marland Kitchen.. 1 Tab  By Mouth Daily. 12)  Calcium Carbonate-Vitamin D 600-400 Mg-Unit Tabs (Calcium Carbonate-Vitamin D) .Marland Kitchen.. 1 Tab By Mouth Daily.  Allergies (verified): No Known Drug Allergies  Past History:  Past Medical History: Last updated: 07/31/2007 Uncontrolled :  1- DM 2  ( A1C : > 14 % on 03/08,  microabuminuria 1+ 10/03)                         2-HTN                         3-Hyperlipidemia ( LDL : 191 !! on 08/08) All of them uncontrolled 2 to poor compliance with diet/ exercise/ meds. Now pt with [facility] plan.   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 Hypertensive Well apearing, obese, pleasant  Ears:  difficult limited funduscopic  exam pupils equal, round and reactive to light, extraoccular movements intact   Lungs:  Normal respiratory effort, chest expands symmetrically. Lungs are clear to auscultation, no crackles or wheezes. Heart:  RRR,no murmurs no gallop, no rub, no JVD, no LE edema  Diabetes Management Exam:    Foot Exam (with socks and/or shoes not present):       Sensory-Pinprick/Light touch:          Left medial foot (L-4): normal          Left dorsal foot (L-5): normal          Left lateral foot (S-1): normal          Right medial foot (L-4): normal          Right dorsal foot (L-5): normal          Right lateral foot (S-1): normal       Sensory-Monofilament:          Left foot: normal          Right foot: normal       Inspection:          Left foot: normal          Right foot: normal       Nails:          Left foot: normal          Right foot: normal   Impression & Recommendations:  Problem # 1:  HYPERTENSION, BENIGN SYSTEMIC (ICD-401.1) Assessment Deteriorated  Likely secondary to poor medication adherence (generally and compounded by finances. DASH diet reviewed. Increasing activity discussed.  Her updated medication list for this problem includes:    Clonidine Hcl 0.2 Mg Tabs (Clonidine hcl) .Marland Kitchen... Take 1 tablet by mouth twice a day    Metoprolol Succinate 100 Mg Tb24 (Metoprolol succinate) .Marland Kitchen... 1 tablet by mouth once a day    Norvasc 10 Mg Tabs (Amlodipine besylate) .Marland Kitchen... Take 1 tablet by mouth once a day    Lisinopril 40 Mg Tabs (Lisinopril) .Marland Kitchen... 1 tab by mouth daily.    Hydrochlorothiazide 25 Mg Tabs (Hydrochlorothiazide) .Marland Kitchen... 1 tab by mouth daily.     BP today: 150/88 Prior BP: 145/83 (03/03/2010)  Prior 10 Yr Risk Heart Disease: 24 % (12/09/2007)  Labs Reviewed: K+: 4.3 (09/03/2009) Creat: : 0.95 (09/03/2009)   Chol: 266 (09/03/2009)   HDL: 61 (09/03/2009)   LDL:  180 (09/03/2009)   TG: 123 (09/03/2009)  Orders: [place]- Est  Level 4 [id number])  Problem # 2:  DIABETES MELLITUS,  II, COMPLICATIONS ([id number])  Deteriorated likely secondary to poor food choices, poor portion control, sendentary lifestyle. Will increase Lantus to 65 units. Refer again to pharm clinic for further management. Handouts on portion control, carb counting, reviewed and given. Reviewed exercise. Follow up three months.  Her updated medication list for this problem includes:    Aspirin Ec 81 Mg Tbec (Aspirin) .Marland Kitchen... Take 1 tablet by mouth every morning    Metformin Hcl 500 Mg Tabs (Metformin hcl) .Marland Kitchen... Take 2 tablet by mouth twice a day    Glucotrol 10 Mg Tabs (Glipizide) .Marland Kitchen... 1 tab by mouth two times a day    Lantus 100 Unit/ml Soln (Insulin glargine) .Marland KitchenMarland KitchenMarland KitchenMarland Kitchen 65 units sq q am. disp one month supply    Lisinopril 40 Mg Tabs (Lisinopril) .Marland Kitchen... 1 tab by mouth daily.  Orders: A1C-[facility] [id number]) [place]- Est  Level 4 [id number])  Labs Reviewed: Creat: 0.95 (09/03/2009)   Microalbumin: trace (12/16/2007) Reviewed HgBA1c results: 12.4 (06/15/2010)  11.1 (03/03/2010)  Problem # 3:  HYPERLIPIDEMIA (ICD-272.4) Assessment: Deteriorated  Again likely secondary to poor food choices, poor medication adherence. Advised regarding dietary and exercise changes. Follow in three months.   Her updated medication list for this problem includes:    Pravastatin Sodium 40 Mg Tabs (Pravastatin sodium) ..... One by mouth at bedtime  Orders: [place]- Est  Level 4 [id number])  Complete Medication List: 1)  Albuterol 90 Mcg/act Aers (Albuterol) .... Inhale 2 puff using inhaler four times a day 2)  Aspirin Ec 81 Mg Tbec (Aspirin) .... Take 1 tablet by mouth every morning 3)  Clonidine Hcl 0.2 Mg Tabs (Clonidine hcl) .... Take 1 tablet by mouth twice a day 4)  Metformin Hcl 500 Mg Tabs (Metformin hcl) .... Take 2 tablet by mouth twice a day 5)  Metoprolol Succinate 100 Mg Tb24 (Metoprolol succinate) .Marland Kitchen.. 1 tablet by mouth once a day 6)  Norvasc 10 Mg Tabs (Amlodipine besylate) .... Take 1 tablet by mouth once a day 7)  Glucotrol 10  Mg Tabs (Glipizide) .Marland Kitchen.. 1 tab by mouth two times a day 8)  Pravastatin Sodium 40 Mg Tabs (Pravastatin sodium) .... One by mouth at bedtime 9)  Lantus 100 Unit/ml Soln (Insulin glargine) .... 65 units sq q am. disp one month supply 10)  Lisinopril 40 Mg Tabs (Lisinopril) .Marland Kitchen.. 1 tab by mouth daily. 11)  Hydrochlorothiazide 25 Mg Tabs (Hydrochlorothiazide) .Marland Kitchen.. 1 tab by mouth daily. 12)  Calcium Carbonate-vitamin D 600-400 Mg-unit Tabs (Calcium carbonate-vitamin d) .Marland Kitchen.. 1 tab by mouth daily.  Patient Instructions: 1)  Take all of your medications as directed. 2)  Increase your Lantus to 65 units each morning.  3)  Stop eating sweets and fried foods as we discussed. 4)  Start walking 30 minutes a day every day.  5)  Follow up in 3 months.  Prescriptions: LISINOPRIL 40 MG TABS (LISINOPRIL) 1 tab by mouth daily.  #30 Tablet x 3   Entered and Authorized by:   Mariana Arn  MD   Signed by:   Mariana Arn  MD on 06/15/2010   Method used:   Electronically to        [facility] [phone]* (retail)       [address]       [place], [place]  [id number]  Ph: [phone] or [phone]       Fax: [phone]   [id number] LANTUS 100 UNIT/ML SOLN (INSULIN GLARGINE) 65 units SQ q am. Disp one month supply  #1 x 3   Entered and Authorized by:   Mariana Arn  MD   Signed by:   Mariana Arn  MD on 06/15/2010   Method used:   Electronically to        [facility] [phone]* (retail)       [place], [place]  [id number]       Ph: [phone] or [phone]       Fax: [phone]   RxID:   [id number]   Laboratory Results   Blood Tests   Date/Time Received: June 15, 2010 2:00 PM  Date/Time Reported: June 15, 2010 2:13 PM   HGBA1C: 12.4%   (Normal Range: Non-Diabetic - 3-6%   Control Diabetic - 6-8%)  Comments: ...............test performed by......Marland KitchenBonnie A. Martinique, MLS  (ASCP)cm      Prevention & Chronic Care Immunizations   Influenza vaccine: Fluvax Non-MCR  (08/30/2009)   Influenza vaccine due: 06/02/2010    Tetanus booster: 03/03/1999: Done.   Tetanus booster due: 03/02/2009    Pneumococcal vaccine: Done.  (08/03/1999)   Pneumococcal vaccine due: None    H. zoster vaccine: Not documented  Colorectal Screening   Hemoccult: Done.  (07/07/2005)   Hemoccult due: Not Indicated    Colonoscopy: Done.  (08/02/2004)   Colonoscopy due: 08/02/2014  Other Screening   Pap smear: normal  (04/14/2008)   Pap smear due: 04/14/2009    Mammogram: abnormal  (02/21/2008)   Mammogram due: 02/20/2009    DXA bone density scan: Not documented   Smoking status: never  (06/15/2010)  Diabetes Mellitus   HgbA1C: 12.4  (06/15/2010)   Hemoglobin A1C due: 12/01/2009    Eye exam: Not documented    Foot exam: yes  (06/15/2010)   High risk foot: Not documented   Foot care education: Not documented   Foot exam due: 12/01/2009    Urine microalbumin/creatinine ratio: Not documented   Urine microalbumin/cr due: 12/15/2008    Diabetes flowsheet reviewed?: Yes   Progress toward A1C goal: Deteriorated  Lipids   Total Cholesterol: 266  (09/03/2009)   LDL: 180  (09/03/2009)   LDL Direct: Not documented   HDL: 61  (09/03/2009)   Triglycerides: 123  (09/03/2009)   Lipid panel due: 08/31/2010    SGOT (AST): 11  (09/03/2009)   SGPT (ALT): 9  (09/03/2009)   Alkaline phosphatase: 91  (09/03/2009)   Total bilirubin: 0.4  (09/03/2009)   Liver panel due: 12/01/2009    Lipid flowsheet reviewed?: Yes   Progress toward LDL goal: Deteriorated  Hypertension   Last Blood Pressure: 150 / 88  (06/15/2010)   Serum creatinine: 0.95  (09/03/2009)   Serum potassium 4.3  (XX123456)   Basic metabolic panel due: 0000000    Hypertension flowsheet reviewed?: Yes   Progress toward BP goal: Deteriorated  Self-Management Support :   Personal Goals (by the next  clinic visit) :     Personal A1C goal: 9  (08/31/2009)     Personal blood pressure goal: 130/80  (08/31/2009)     Personal LDL goal: 70  (08/31/2009)    Patient will work on the following items until the next clinic visit to reach self-care goals:     Medications  and monitoring: take my medicines every day, check my blood sugar, check my blood pressure, bring all of my medications to every visit, weigh myself weekly, examine my feet every day  (06/15/2010)     Eating: drink diet soda or water instead of juice or soda, eat more vegetables, use fresh or frozen vegetables, eat foods that are low in salt, eat baked foods instead of fried foods, eat fruit for snacks and desserts  (06/15/2010)     Activity: take a 30 minute walk every day  (06/15/2010)    Diabetes self-management support: Written self-care plan, Education handout, Pre-printed educational material, Referred for DM self-management training  (06/15/2010)   Diabetes care plan printed   Diabetes education handout printed    Hypertension self-management support: Education handout, Pre-printed educational material  (06/15/2010)   Hypertension education handout printed    Lipid self-management support: Written self-care plan, Education handout  (06/15/2010)   Lipid self-care plan printed.   Lipid education handout printed

## 2010-11-03 NOTE — Miscellaneous (Signed)
Summary: re: OV today/TS  Clinical Lists Changes called pt's house and left message to call us back. pt had appt with [name] today at 10:30am and her husband had appt at 9:45am. pt left w/out being seen and did not tell us, husband informed us about it. I told the pt at least 3 times her appt time and also gave pt a flu shot before seeing the doctor and we did the A1C before seeing the doctor. Pt needs to schedule appt with Dr.Carew to discuss her Diabetes. Mauricia Area CMA,  September 15, 2010 11:47 AM   Note that patient showed up at [facility] for her 10:30 AM appointment and apparently was upset about not being seen right away and having to wait (though patients who were scheduled before her were being seen). She left without letting us know, though received the above services.   Mariana Arn  MD  September 16, 2010 10:42 AM

## 2010-11-03 NOTE — Assessment & Plan Note (Signed)
Summary: f/u,df  Patient left before being seen (she was 1.5 hours early for her appointment).  Wendy Arn  MD  September 19, 2010 1:54 PM  Vital Signs:  Patient profile:   70 year old female Height:      61.75 inches Weight:      188 pounds BMI:     34.79 Pulse rate:   85 / minute BP sitting:   142 / 76  (right arm)  Vitals Entered By: Mauricia Area CMA, (September 15, 2010 9:02 AM) CC: f/up DM/HTN. flu shot. Is Patient Diabetic? Yes Pain Assessment Patient in pain? no        CC:  f/up DM/HTN. flu shot..  Habits & Providers  Alcohol-Tobacco-Diet     Tobacco Status: never  Allergies: No Known Drug Allergies   Complete Medication List: 1)  Albuterol 90 Mcg/act Aers (Albuterol) .... Inhale 2 puff using inhaler four times a day 2)  Aspirin Ec 81 Mg Tbec (Aspirin) .... Take 1 tablet by mouth every morning 3)  Clonidine Hcl 0.2 Mg Tabs (Clonidine hcl) .... Take 1 tablet by mouth twice a day 4)  Metformin Hcl 500 Mg Tabs (Metformin hcl) .... Take 2 tablet by mouth twice a day 5)  Metoprolol Succinate 100 Mg Tb24 (Metoprolol succinate) .Marland Kitchen.. 1 tablet by mouth once a day 6)  Norvasc 10 Mg Tabs (Amlodipine besylate) .... Take 1 tablet by mouth once a day 7)  Glucotrol 10 Mg Tabs (Glipizide) .Marland Kitchen.. 1 tab by mouth two times a day 8)  Pravastatin Sodium 40 Mg Tabs (Pravastatin sodium) .... One by mouth at bedtime 9)  Lantus 100 Unit/ml Soln (Insulin glargine) .... 65 units sq q am. disp one month supply 10)  Lisinopril 40 Mg Tabs (Lisinopril) .Marland Kitchen.. 1 tab by mouth daily. 11)  Hydrochlorothiazide 25 Mg Tabs (Hydrochlorothiazide) .Marland Kitchen.. 1 tab by mouth daily. 12)  Calcium Carbonate-vitamin D 600-400 Mg-unit Tabs (Calcium carbonate-vitamin d) .Marland Kitchen.. 1 tab by mouth daily.  Other Orders: A1C-[facility] [id number]) Influenza Vaccine MCR [id number])   Orders Added: 1)  A1C-[facility] [83036] 2)  Influenza Vaccine MCR [[id number]]   Immunizations Administered:  Influenza Vaccine # 1:    Vaccine Type: Fluvax MCR    Site: left deltoid    Mfr: GlaxoSmithKline    Dose: 0.5 ml    Route: IM    Given by: Mauricia Area CMA,    Exp. Date: 04/01/2011    Lot #: [id number]    VIS given: 04/26/10 version given September 15, 2010.  Flu Vaccine Consent Questions:    Do you have a history of severe allergic reactions to this vaccine? no    Any prior history of allergic reactions to egg and/or gelatin? no    Do you have a sensitivity to the preservative Thimersol? no    Do you have a past history of Guillan-Barre Syndrome? no    Do you currently have an acute febrile illness? no    Have you ever had a severe reaction to latex? no    Vaccine information given and explained to patient? yes    Are you currently pregnant? no   Immunizations Administered:  Influenza Vaccine # 1:    Vaccine Type: Fluvax MCR    Site: left deltoid    Mfr: GlaxoSmithKline    Dose: 0.5 ml    Route: IM    Given by: Mauricia Area CMA,    Exp. Date: 04/01/2011    Lot #: [id number]    VIS given: 04/26/10 version given  September 15, 2010.    Laboratory Results   Blood Tests   Date/Time Received: September 15, 2010 9:08 AM  Date/Time Reported: September 15, 2010 9:50 AM   HGBA1C: 11.1%   (Normal Range: Non-Diabetic - 3-6%   Control Diabetic - 6-8%)  Comments: ...............test performed by......Marland KitchenBonnie A. Martinique, MLS (ASCP)cm

## 2010-11-03 NOTE — Progress Notes (Signed)
Summary: Rx  Phone Note Refill Request Call back at Home Phone [phone]   Refills Requested: Medication #1:  LANTUS 100 UNIT/ML SOLN 70 units SQ q am. Disp one month supply pt asking for 2 vials  Initial call taken by: Samara Snide,  October 17, 2010 2:07 PM  Follow-up for Phone Call        will forward to MD. Follow-up by: Marcell Barlow RN,  October 17, 2010 2:16 PM    Prescriptions: LANTUS 100 UNIT/ML SOLN (INSULIN GLARGINE) 70 units SQ q am. Disp one month supply  #1 month x 3   Entered and Authorized by:   Mariana Arn  MD   Signed by:   Mariana Arn  MD on 10/18/2010   Method used:   Electronically to        [facility] [phone]* (retail)       [place], [place]  [id number]       Ph: [phone] or [phone]       Fax: [phone]   RxID:   [id number]

## 2010-11-03 NOTE — Letter (Signed)
Summary: Generic Letter  [facility]  [address]   [place], [place] [address]   Phone: [phone]  Fax: [phone]    10/26/2010  [address] [place],   [address]  Dear Ms. Ronnald Ramp,  We are happy to let you know that since you are covered under Medicare you are able to have a FREE visit at the [facility] to discuss your HEALTH. This is a new benefit for Medicare.  There will be no co-payment.  At this visit you will meet with Lamont Dowdy an expert in wellness and the health coach at our clinic.  At this visit we will discuss ways to keep you healthy and feeling well.  This visit will not replace your regular doctor visit and we cannot refill medications.     You will need to plan to be here at least one hour to talk about your medical history, your current status, review all of your medications, and discuss your future plans for your health.  This information will be entered into your record for your doctor to have and review.  If you are interested in staying healthy, this type of visit can help.  Please call the office at: [phone], to schedule a "Medicare Wellness Visit".  The day of the visit you should bring in all of your medications, including any vitamins, herbs, over the counter products you take.  Make a list of all the other doctors that you see, so we know who they are. If you have any other health documents please bring them.  We look forward to helping you stay healthy.  Sincerely,   Suzanne Lineberry [place]

## 2010-11-03 NOTE — Assessment & Plan Note (Signed)
Summary: f/u dm/bmc   Vital Signs:  Patient profile:   70 year old female Height:      61.75 inches Weight:      189.1 pounds BMI:     34.99 Temp:     98.4 degrees F oral Pulse rate:   85 / minute BP sitting:   126 / 82  (left arm) Cuff size:   regular  Vitals Entered By: Levert Feinstein LPN (December 21, 624THL 11:18 AM) CC: f/u dm Is Patient Diabetic? Yes Did you bring your meter with you today? No Pain Assessment Patient in pain? no        Primary Care Provider:  Mariana Arn  MD  CC:  f/u dm.  History of Present Illness: 1) HTN - 126/82.  Takong medications w/o side effects. Denies any CP, HA, SOB, or blurred vision or LE edema. On metoprolol, norvasc, clonidine, lisinopril, HCTZ. Does not monitor salt intake, walks three minutes on treadmill 2 x a month at most.   2)  DM - Fasting CBGs 80 -2 (checks first thing in the morning).  Takes 65 units Lantus qAM.  On Metformin and Glipizide but adherence has, and continues to be an issue.  A1C 11.1 last week, 12.4 at last visit, .  Has been referred to Dr. Valentina Lucks for pharmacy clinic on two occasions but did not make appointment either time.  Does not want to go to nutrition clinic because - she "knows how to eat right". Likes fried foods and sweets - snacks are cupcakes, brownies". Likes potatoes. States that she has difficulties with controlling portion size. Reports that she will change behaviors because she "wants to live and be there for her family" .  Today denies polyuria, polydipsia, vision change. Reports that she saw the opthalmologist 1 year ago and had a "good report"     Habits & Providers  Alcohol-Tobacco-Diet     Tobacco Status: never  Current Medications (verified): 1)  Albuterol 90 Mcg/act Aers (Albuterol) .... Inhale 2 Puff Using Inhaler Four Times A Day 2)  Aspirin Ec 81 Mg Tbec (Aspirin) .... Take 1 Tablet By Mouth Every Morning 3)  Clonidine Hcl 0.2 Mg Tabs (Clonidine Hcl) .... Take 1 Tablet By Mouth Twice A  Day 4)  Metformin Hcl 500 Mg Tabs (Metformin Hcl) .... Take 2 Tablet By Mouth Twice A Day 5)  Metoprolol Succinate 100 Mg Tb24 (Metoprolol Succinate) .Marland Kitchen.. 1 Tablet By Mouth Once A Day 6)  Norvasc 10 Mg Tabs (Amlodipine Besylate) .... Take 1 Tablet By Mouth Once A Day 7)  Glucotrol 10 Mg  Tabs (Glipizide) .Marland Kitchen.. 1 Tab By Mouth Two Times A Day 8)  Pravastatin Sodium 40 Mg  Tabs (Pravastatin Sodium) .... One By Mouth At Bedtime 9)  Lantus 100 Unit/ml Soln (Insulin Glargine) .... 65 Units Sq Q Am. Disp One Month Supply 10)  Lisinopril 40 Mg Tabs (Lisinopril) .Marland Kitchen.. 1 Tab By Mouth Daily. 11)  Hydrochlorothiazide 25 Mg Tabs (Hydrochlorothiazide) .Marland Kitchen.. 1 Tab By Mouth Daily. 12)  Calcium Carbonate-Vitamin D 600-400 Mg-Unit Tabs (Calcium Carbonate-Vitamin D) .Marland Kitchen.. 1 Tab By Mouth Daily.  Allergies (verified): No Known Drug Allergies  Physical Exam  General:  Vitals reviewed. Hypertensive Well apearing, obese, pleasant    Impression & Recommendations:  Problem # 1:  HYPERTENSION, BENIGN SYSTEMIC (ICD-401.1)  At goal. DASH diet reviewed. Follow up in three months.   Her updated medication list for this problem includes:    Clonidine Hcl 0.2 Mg Tabs (Clonidine hcl) .Marland Kitchen... Take  1 tablet by mouth twice a day    Metoprolol Succinate 100 Mg Tb24 (Metoprolol succinate) .Marland Kitchen... 1 tablet by mouth once a day    Norvasc 10 Mg Tabs (Amlodipine besylate) .Marland Kitchen... Take 1 tablet by mouth once a day    Lisinopril 40 Mg Tabs (Lisinopril) .Marland Kitchen... 1 tab by mouth daily.    Hydrochlorothiazide 25 Mg Tabs (Hydrochlorothiazide) .Marland Kitchen... 1 tab by mouth daily.  BP today: 126/82 Prior BP: 142/76 (09/15/2010)  Prior 10 Yr Risk Heart Disease: 24 % (12/09/2007)  Labs Reviewed: K+: 4.3 (09/03/2009) Creat: : 0.95 (09/03/2009)   Chol: 266 (09/03/2009)   HDL: 61 (09/03/2009)   LDL: 180 (09/03/2009)   TG: 123 (09/03/2009)  Orders: [place]- Est  Level 4 [id number])  Problem # 2:  DIABETES MELLITUS, II, COMPLICATIONS ([id number]) Assessment:  Unchanged  Improved from last visit, though not near goal. Will increase to 70 units at bedtime. Advised regarding dietary changes - patient reports specifics that she is willing to change including, no more potatoes, no more sweets, but has had difficulty making these changes. Will follow in three months.   Her updated medication list for this problem includes:    Aspirin Ec 81 Mg Tbec (Aspirin) .Marland Kitchen... Take 1 tablet by mouth every morning    Metformin Hcl 500 Mg Tabs (Metformin hcl) .Marland Kitchen... Take 2 tablet by mouth twice a day    Glucotrol 10 Mg Tabs (Glipizide) .Marland Kitchen... 1 tab by mouth two times a day    Lantus 100 Unit/ml Soln (Insulin glargine) .Marland KitchenMarland KitchenMarland KitchenMarland Kitchen 70 units sq q am. disp one month supply    Lisinopril 40 Mg Tabs (Lisinopril) .Marland Kitchen... 1 tab by mouth daily.  Orders: [place]- Est  Level 4 ([id number])  Complete Medication List: 1)  Albuterol 90 Mcg/act Aers (Albuterol) .... Inhale 2 puff using inhaler four times a day 2)  Aspirin Ec 81 Mg Tbec (Aspirin) .... Take 1 tablet by mouth every morning 3)  Clonidine Hcl 0.2 Mg Tabs (Clonidine hcl) .... Take 1 tablet by mouth twice a day 4)  Metformin Hcl 500 Mg Tabs (Metformin hcl) .... Take 2 tablet by mouth twice a day 5)  Metoprolol Succinate 100 Mg Tb24 (Metoprolol succinate) .Marland Kitchen.. 1 tablet by mouth once a day 6)  Norvasc 10 Mg Tabs (Amlodipine besylate) .... Take 1 tablet by mouth once a day 7)  Glucotrol 10 Mg Tabs (Glipizide) .Marland Kitchen.. 1 tab by mouth two times a day 8)  Pravastatin Sodium 40 Mg Tabs (Pravastatin sodium) .... One by mouth at bedtime 9)  Lantus 100 Unit/ml Soln (Insulin glargine) .... 70 units sq q am. disp one month supply 10)  Lisinopril 40 Mg Tabs (Lisinopril) .Marland Kitchen.. 1 tab by mouth daily. 11)  Hydrochlorothiazide 25 Mg Tabs (Hydrochlorothiazide) .Marland Kitchen.. 1 tab by mouth daily. 12)  Calcium Carbonate-vitamin D 600-400 Mg-unit Tabs (Calcium carbonate-vitamin d) .Marland Kitchen.. 1 tab by mouth daily.   Orders Added: 1)  [place]- Est  Level 4 [id number]

## 2010-11-25 ENCOUNTER — Other Ambulatory Visit: Payer: Self-pay | Admitting: Family Medicine

## 2010-11-25 DIAGNOSIS — I10 Essential (primary) hypertension: Secondary | ICD-10-CM

## 2010-11-25 NOTE — Telephone Encounter (Signed)
Please review and refill

## 2010-12-09 ENCOUNTER — Other Ambulatory Visit: Payer: Self-pay | Admitting: Family Medicine

## 2010-12-09 NOTE — Telephone Encounter (Signed)
Please review and refill

## 2010-12-29 ENCOUNTER — Encounter: Payer: Self-pay | Admitting: Family Medicine

## 2010-12-29 ENCOUNTER — Ambulatory Visit (INDEPENDENT_AMBULATORY_CARE_PROVIDER_SITE_OTHER): Payer: Medicare Other | Admitting: Family Medicine

## 2010-12-29 DIAGNOSIS — I10 Essential (primary) hypertension: Secondary | ICD-10-CM

## 2010-12-29 DIAGNOSIS — E119 Type 2 diabetes mellitus without complications: Secondary | ICD-10-CM

## 2010-12-29 DIAGNOSIS — E118 Type 2 diabetes mellitus with unspecified complications: Secondary | ICD-10-CM

## 2010-12-29 DIAGNOSIS — E1165 Type 2 diabetes mellitus with hyperglycemia: Secondary | ICD-10-CM

## 2010-12-29 MED ORDER — HYDROCHLOROTHIAZIDE 25 MG PO TABS: 25.0000 mg | ORAL_TABLET | Freq: Every day | ORAL | Status: DC

## 2010-12-29 MED ORDER — AMLODIPINE BESYLATE 10 MG PO TABS: 10.0000 mg | ORAL_TABLET | Freq: Every day | ORAL | Status: DC

## 2010-12-29 MED ORDER — INSULIN GLARGINE 100 UNIT/ML ~~LOC~~ SOLN: 65.0000 [IU] | Freq: Every day | SUBCUTANEOUS | Status: DC

## 2010-12-29 MED ORDER — METFORMIN HCL 500 MG PO TABS: 1000.0000 mg | ORAL_TABLET | Freq: Two times a day (BID) | ORAL | Status: DC

## 2010-12-29 MED ORDER — PRAVASTATIN SODIUM 40 MG PO TABS: 40.0000 mg | ORAL_TABLET | Freq: Every day | ORAL | Status: DC

## 2010-12-29 MED ORDER — INSULIN GLARGINE 100 UNIT/ML ~~LOC~~ SOLN: 70.0000 [IU] | Freq: Every day | SUBCUTANEOUS | Status: DC

## 2010-12-29 MED ORDER — GLIPIZIDE 10 MG PO TABS: 10.0000 mg | ORAL_TABLET | Freq: Two times a day (BID) | ORAL | Status: DC

## 2010-12-29 MED ORDER — CLONIDINE HCL 0.2 MG PO TABS: 0.2000 mg | ORAL_TABLET | Freq: Two times a day (BID) | ORAL | Status: DC

## 2010-12-29 MED ORDER — METOPROLOL SUCCINATE ER 100 MG PO TB24: 100.0000 mg | ORAL_TABLET | Freq: Every day | ORAL | Status: DC

## 2010-12-29 MED ORDER — LISINOPRIL 40 MG PO TABS: 40.0000 mg | ORAL_TABLET | Freq: Every day | ORAL | Status: DC

## 2010-12-29 MED ORDER — ALBUTEROL 90 MCG/ACT IN AERS: 2.0000 | INHALATION_SPRAY | RESPIRATORY_TRACT | Status: DC | PRN

## 2010-12-29 NOTE — Patient Instructions (Signed)
Follow up in one month. Increase your Lantus to 70 units  Increase your exercise as we discussed.

## 2010-12-30 ENCOUNTER — Other Ambulatory Visit: Payer: Self-pay | Admitting: Family Medicine

## 2010-12-30 NOTE — Telephone Encounter (Signed)
Refill request

## 2010-12-30 NOTE — Progress Notes (Signed)
  Subjective:    Patient ID: Wendy [name], female    DOB: [birth year], 70 y.o.   MRN: [id number]  HPI ) HTN - 170/80 today.  Reports that she is taking medications w/o side effects, though adherence has been an issue for her in the past. Denies any CP, HA, SOB, or blurred vision or LE edema. On metoprolol, norvasc, clonidine, lisinopril, HCTZ. Does not monitor salt intake, but has increased exercise on treadmill to 10 minutes per day BID 3-4 times per week.   2)  DM - Fasting CBGs 78 - 200 (checks first thing in the morning). At times post-prandials are as high as 300. Reports that she is taking 60 units of Lantus - should be taking 65. On Metformin and Glipizide but adherence has, and continues to be an issue.  11.4 at last visit, 13 today.  Has been referred to Dr. Valentina Lucks for pharmacy clinic on two occasions but did not make appointment either time.  Does not want to go to nutrition clinic because - she "knows how to eat right". Likes fried foods and sweets - snacks are cupcakes, brownies". Likes potatoes. States that she has difficulties with controlling portion size but again does not want to meet dietician. Reports that she will change behaviors because she "wants to live and be there for her family" .  Today denies polyuria, polydipsia, vision change. Reports that she saw the opthalmologist 1 year ago and had a "good report"   Review of Systems As per HPI    Objective:   Physical Exam  Constitutional: She appears well-developed and well-nourished. No distress.  Cardiovascular: Normal rate, regular rhythm and normal heart sounds.   Pulmonary/Chest: Effort normal. No respiratory distress. She has no wheezes.          Assessment & Plan:

## 2010-12-30 NOTE — Assessment & Plan Note (Signed)
Deteriorated. Advised to increase to 70 units Lantus. Follow up as below. Continue oral medications.

## 2010-12-30 NOTE — Assessment & Plan Note (Signed)
Deteriorated. Suspect poor compliance with medications - as such will not make changes to medications at this time - advised regarding need to adhere to medication regimen. Follow up as below. Discussed diet and exercise for 15 minutes.

## 2011-01-25 ENCOUNTER — Ambulatory Visit (INDEPENDENT_AMBULATORY_CARE_PROVIDER_SITE_OTHER): Payer: Medicare Other | Admitting: Family Medicine

## 2011-01-25 ENCOUNTER — Encounter: Payer: Self-pay | Admitting: Family Medicine

## 2011-01-25 DIAGNOSIS — I10 Essential (primary) hypertension: Secondary | ICD-10-CM

## 2011-01-26 NOTE — Progress Notes (Signed)
  Subjective:    Patient ID: Wendy [name], female    DOB: [birth year], 70 y.o.   MRN: [id number]  HPI  ) HTN - 158/84 today.  Reports that she is taking medications w/o side effects, though adherence has been an issue for her in the past. Denies any CP, HA, SOB, or blurred vision or LE edema. On metoprolol, norvasc, clonidine, lisinopril, HCTZ. Does not monitor salt intake, but has continued exercise on treadmill to 10 minutes per day BID 3-4 times per week.   Review of Systems As per HPI     Objective:   Physical Exam Constitutional: She appears well-developed and well-nourished. No distress.  Cardiovascular: Normal rate, regular rhythm and normal heart sounds.   Pulmonary/Chest: Effort normal. No respiratory distress. She has no wheezes.  Extremities: no edema        Assessment & Plan:

## 2011-01-26 NOTE — Assessment & Plan Note (Signed)
Improved since last visit. Still suspect incomplete compliance with medications - as such will not make changes to medications at this time - advised regarding need to adhere to medication regimen - would consider increase metoprolol if still elevated at next visit in 8 weeks. Follow up as below. Discussed diet and exercise for 15 minutes. Handouts on low sodium given.

## 2011-02-14 NOTE — Op Note (Signed)
NAMENIKOLA, RENCK                  ACCOUNT NO.:  [id number]   MEDICAL RECORD NO.:  [id number]          PATIENT TYPE:  AMB   LOCATION:  [place], [place]                          FACILITY:  [facility]   PHYSICIAN:  Thomas A. Cornett, M.D.DATE OF BIRTH:  1941/02/12   DATE OF PROCEDURE:  05/05/2008  DATE OF DISCHARGE:                               OPERATIVE REPORT   PREOPERATIVE DIAGNOSIS:  Left breast mass.   POSTOPERATIVE DIAGNOSIS:  Left breast mass.   PROCEDURE:  Left breast needle-localized excisional biopsy.   SURGEON:  Marcello Moores A. Cornett, MD   ANESTHESIA:  General LMA with 0.25% Sensorcaine with epinephrine local.   ESTIMATED BLOOD LOSS:  20 mL.   SPECIMENS:  Left breast tissue with localizing wire and previously  placed clip to pathology.   INDICATIONS FOR PROCEDURE:  The patient is a 70 year old female who had  a biopsy of the left breast lesion.  It had biphasic proliferation  noted.  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  She presents  today for this.   DESCRIPTION OF PROCEDURE:  The patient was brought to the operating room  after undergoing left breast needle localization.  After induction of  LMA anesthesia, the left breast was prepped and draped in the sterile  fashion.  Wire was trimmed.  Sensorcaine 0.25% was infiltrated in the  left lateral outer quadrant.  Curvilinear incision was made.  Wire was  pulled out of the incision.  All tissue around the wire was excised.  Radiograph was taken in the operating room, which showed the wire, mass,  and clip to be present.  This was sent to pathology for evaluation.  The  radiologist read the film as well and agreed.  Irrigation was used and  hemostasis was achieved after suctioning out the irrigation.  Wound was  closed in layers with deep layer of 3-0 Vicryl and a 4-0 Monocryl  subcuticular stitch.  Dermabond was applied.  All final counts of  sponge,  needle, and instruments were found to be correct for this  portion of the case.  The patient was then awoke and taken to recovery  in satisfactory condition.      Thomas A. Cornett, M.D.  Electronically Signed     TAC/MEDQ  D:  05/05/2008  T:  05/05/2008  Job:  [id number]   cc:   Lanae Boast

## 2011-02-17 NOTE — Discharge Summary (Signed)
NAME:  Wendy [name], Wendy [name] NO.:  [id number]   MEDICAL RECORD NO.:  [id number]          PATIENT TYPE:  INP   LOCATION:  3707                         FACILITY:  [facility]   PHYSICIAN:  Wendy Chol, MD DATE OF BIRTH:  [birth year]   DATE OF ADMISSION:  07/15/2006  DATE OF DISCHARGE:  07/16/2006                                 DISCHARGE SUMMARY   PRIMARY CARE Wendy [name]:  Dr. Thornton [name], [place].   ADMISSION DIAGNOSES:  1. Atypical chest pain, rule out myocardial infarction.  2. Type 2 diabetes.  3. Hypertension, uncontrolled.  4. Hyperlipidemia.  5. Gastroesophageal reflux disease.   DISCHARGE DIAGNOSES:  1. Atypical chest pain, noncardiac in etiology and likely secondary to      gastroesophageal reflux disease.  2. Type 2 diabetes.  3. Uncontrolled hypertension.  4. Hyperlipidemia.  5. Gastroesophageal reflux disease.   BRIEF HOSPITAL COURSE:  This is a 70 year old African American female who  was admitted with atypical chest pain and a history of type 2 diabetes,  hypertension and hyperlipidemia for rule out MI.  Patient was placed on  telemetry.  Initial EKG showed sinus bradycardia, but no acute ischemia.  Chest x-ray showed no acute cardiopulmonary processes.  Patient was cycled  with cardiac enzymes x3, which were all negative.  Patient did have some  gastroesophageal reflux symptoms, including burping on admission and  admitted that her Prilosec over-the-counter was not adequately controlling  her symptoms prior to admission.  Patient was given Protonix 40 mg as an  inpatient and also a GI cocktail p.r.n. with improvement in her symptoms.  On the day of discharge, patient complains of headache and back pain, but  denies any chest pain, shortness of breath, diaphoresis or dizziness.   PROCEDURES:  1. Chest x-ray on July 15, 2006 showed no acute cardiopulmonary      processes and mild cardiomegaly.  2. EKG on July 15, 2006 showed sinus bradycardia, but no acute      ischemia.   DISCHARGE LABS:  Include a BMP with a sodium of 142, potassium 4.2, chloride  106, bicarb 29, BUN 13, creatinine 0.9, glucose 153, calcium 9.3.  CBC shows  a white count of 7.9, hemoglobin of 12.4, hematocrit of 36.5, platelets of  235.  Cardiac enzymes were negative x3.  TSH was normal at 2.998.  Hemoglobin A1c is elevated at 12.1%.  Fasting lipid panel shows a total  cholesterol of 127, triglycerides of 89, HDL of 42 and LDL of 67.  CBGs  during admission ranged from 109 to 317, but remained in the 100s to 150s  after patient was started on home diabetes regimen.   DISCHARGE MEDICATIONS:  1. Hydrochlorothiazide 25 mg p.o. once daily (this is a new dose), one      month supply prescription provided.  2. Clonidine 0.2 mg twice daily.  3. Lisinopril 20 mg daily.  4. Norvasc 10 mg daily.  5. Metoprolol 50 mg twice daily.  6. Lipitor 80 mg once daily.  7. Gemfibrozil 600 mg twice daily with meals.  8. Zetia 10 mg daily.  9. Lantus 40 units subcu every morning and 10 units subcu q.h.s.  10.Januvia 100 mg daily.  11.Glucotrol XL 20 mg daily.  12.Aspirin 81 mg daily.  13.Protonix 40 mg twice daily (this is a new medication) one month supply      prescription provided.   PENDING RESULTS AND ISSUES TO BE FOLLOWED AT DISCHARGE:  1. Diabetes - patient admits to having difficulty affording her meds, but      does not specifically endorse noncompliance with her diabetes      medication.  Patient's hemoglobin A1c is significantly elevated at      12.1%; however, patient's CBGs were in the 100s to 150s once she was      placed on her home diabetes regimen, implying likely noncompliance.      Patient does admit to dietary noncompliance with her diabetes.  Would      recommend further followup as an outpatient.  2. Atypical chest pain - would recommend patient be scheduled for an      exercise stress test as an outpatient.   Patient did have exercise      stress test many years ago with Dr. Melvern [name] previously.  We will leave      the discretion to [name] as to whether she will be scheduled with      Dr. Melvern [name] or [facility] for a repeat exercise stress test.  3. Patient will be discharged on Protonix 40 mg twice daily, which may be      decreased as the patient's symptoms improve.  May consider GI consult      if patient's symptoms fail to improve.  4. Given patient's multiple medications for her multiple medical problems,      would consider a pharmacy consult as an outpatient given that patient      has difficulty affording her medications to assess whether patient's      medications may be decreased or combined.   DISCHARGE INSTRUCTIONS:  Patient is to return to work on July 17, 2006  and is to follow a carbohydrate modified diet.  Patient has no activity  restrictions.   FOLLOWUP APPOINTMENTS:  Patient has a followup appointment with her primary  care Wendy [name], [name], at the [facility] on July 20, 2006 at 1:30 p.m.   Patient was discharged to home in stable condition.     ______________________________  Wendy [name], M.D.    ______________________________  Wendy Chol, MD    EE/MEDQ  D:  07/16/2006  T:  07/16/2006  Job:  [id number]   cc:   Wendy Rinne, MD

## 2011-02-17 NOTE — Op Note (Signed)
NAME:  Wendy [name], Wendy [name] NO.:  [id number]   MEDICAL RECORD NO.:  [id number]          PATIENT TYPE:  AMB   LOCATION:  ENDO                         FACILITY:  [facility]   PHYSICIAN:  Wonda Horner, M.D.   DATE OF BIRTH:  1941-08-25   DATE OF PROCEDURE:  08/12/2004  DATE OF DISCHARGE:                                 OPERATIVE REPORT   PROCEDURE:  Colonoscopy with biopsy.   INDICATIONS FOR PROCEDURE:  Screening.   CONSENT:  Informed consent was obtained after explanation of the risks of  bleeding, infection, and perforation.   PREMEDICATION:  Fentanyl 100 mcg IV, Versed 10 mg IV.   PROCEDURE IN DETAIL:  With the patient in the left lateral decubitus  position, a rectal exam was performed and no masses were felt.  The Olympus  colonoscope was inserted into the rectum and advanced around the colon to  the cecum.  Cecal landmarks were identified.  The cecum and ascending colon  looked normal.  The transverse colon looked normal.  The descending colon  and sigmoid looked normal.  In the rectum, there was a small polyp biopsied  with cold forceps.  The scope was retroflexed in the rectum, no specific  abnormalities were seen on retroflexion.  She tolerated the procedure well  without complications.   IMPRESSION:  Small rectal polyp.   PLAN:  Biopsies will be checked.       SFG/MEDQ  D:  08/12/2004  T:  08/12/2004  Job:  [id number]   cc:   Standley Dakins. Kennon Rounds, M.D.  Fax: [phone]

## 2011-02-17 NOTE — H&P (Signed)
[name], [name] NO.:  [id number]   MEDICAL RECORD NO.:  [id number]          PATIENT TYPE:  INP   LOCATION:  3707                         FACILITY:  [facility]   PHYSICIAN:  Arnette Norris, M.D.       DATE OF BIRTH:  25-Aug-1941   DATE OF ADMISSION:  07/15/2006  DATE OF DISCHARGE:                                HISTORY & PHYSICAL   CHIEF COMPLAINT:  Chest pain.   HISTORY OF PRESENT ILLNESS:  [name] is a 70 year old female with history  of diabetes, hypertension, hyperlipidemia, and GERD, who presents to the ED  with a 2-week history of chest pain.  She reports the pain is retrosternal  and radiates to her right arm and back.  Patient also reports the pain was  not constant, and it was relieved by belching.  Pain is also nonexertional.  She reports no nausea, vomiting, or diaphoresis.  She does endure some  decreased urinary output, but denies dysuria, increased frequency, or  hematuria.  Patient was admitted by the medicine teaching service, because  they did not realize she was one of our patients.  They then called [name] to  accept transfer.   REVIEW OF SYSTEMS:  GENERAL:  Negative for fevers.  Negative for weight  loss.  CARDIOVASCULAR:  Positive for chest pain.  Negative for arrhythmias.  PULMONARY:  She denied shortness of breath to [name], but did admit it to the  medicine service.  GI:  She denied any nausea or vomiting.  GU:  She denied dysuria.  SKIN:  She did endorse this itching, somewhat painful, improving rash under  her right axilla.   ALLERGIES:  No known drug allergies.   PAST MEDICAL HISTORY:  She has:  1. Type 2 diabetes.  2. Hyperlipidemia.  3. Hypertension.  4. Atopic dermatitis.  5. Edema due to venous stasis in her legs.  6. Obesity.  7. Heartburn.  8. Uterine fibroids.   SOCIAL HISTORY:  [name] lives in [place] with her husband.  She has 4  children.  She runs a daycare.  She does use snuff regularly, but denies any  smoking or  alcohol abuse.  She does have some medical compliance issues due  to financial problems.   PHYSICAL EXAM:  HER VITAL SIGNS:  Her temperature was 99.4.  Heart rate was  62.  Respiratory rate was 18.  Her blood pressure was 212/82.  Her oxygen  was 100% on room air.  GENERAL APPEARANCE:  She is alert, in no acute distress, obese.  Her chest is not tender to palpation.  HER HEART:  With a regular rate and rhythm.  No murmurs, rubs, or gallops.  HER ABDOMEN:  She had positive bowel sounds.  Nontender.  No epigastric  tenderness to palpation.  EXTREMITIES:  No edema.  2+ pulses bilaterally.  HER SKIN:  She did have a crusted rash, which did appear to be in a  dermatome distribution under her right axilla, but it was crusted over, and  no pus or vesicles could be seen.   LABS:  Her UA showed 11-20 white blood cells, with 0-2 red blood cells, rare  bacteria, and was cloudy.  Urine also showed total protein of 100, negative  nitrites, and moderate leukocyte esterase.  Her point-of-care enzymes showed  that her myoglobin was 52.9, her CK-MB was 1.2, her troponin was less than  0.05.  For her CBC:  Her white count was 7.3, her hemoglobin was 14,  hematocrit was 41, her platelets were 272 with 68% neutrophils, 25%  lymphocytes.  For her CMET:  Her sodium was 138, her potassium was 4.8, her  chloride was 102.  Her CO2 was 26, her BUN was 16, her creatinine was 0.9,  her glucose was 262.  Her AST was 34, ALT was 42, alk phos was 113, T-bili  was 0.6, calcium was 9.1, total protein 7.3, albumin of 3.3.  Chest x-ray  showed no acute changes.  Mild cardiomegaly.  EKG showed normal sinus brady.  Her INR was 0.9.   ASSESSMENT/PLAN:  [name] is a 70 year old female with a history of  uncontrolled hypertension, diabetes, hyperlipidemia, and GERD, who presents  with atypical chest pain.  1. Atypical chest pain, it is unlikely cardiac in origin due to nature of      pain, normal EKG, and negative  point-of-care enzymes, but she does have      risk factors such as uncontrolled hypertension, diabetes, and      hyperlipidemia, which may cause atypical presentation of angina.  We      will, therefore, check cardiac enzymes x3 q.8 hours, her hemoglobin      A1c, and a fasting lipid panel.  We also continue aspirin 81 mg daily.      This is likely secondary to GERD, so we will give her a GI cocktail x1      now, and give her Protonix 40 mg daily.  Will also continue her      metoprolol.  2. Hypertension, currently uncontrolled with a blood pressure of 212/82.      Will continue home meds of clonidine 0.2 mg b.i.d., hydrochlorothiazide      12.5 mg daily, lisinopril 20 mg daily, metoprolol 50 mg b.i.d., and      Norvasc 10 mg daily.  Increase in blood pressure is likely due to      medical noncompliance.  We will give her meds now and reassess blood      pressure to see if we need to increase any dosages.  She is currently      asymptomatic.  3. UTI.  Urinalysis and micro showed moderate leukocyte esterase, rare      bacteria, and 11-20 white blood cells.  Patient is, however, afebrile,      with a normal white blood cell count and is asymptomatic, so we will      not treat her at this time.  4. Diabetes mellitus.  It is currently uncontrolled with a CBG of 262.  We      will continue home meds of Lantus 40 units q.a.m., 10 units q.h.s., and      a sliding-scale insulin with Januvia 100 mg daily.  We will check an      A1c in the a.m.  5. Hyperlipidemia.  Will continue Zetia 10 mg daily and Lipitor 80 mg      daily.  We will check a fasting lipid panel in the a.m.  6. GERD, likely contributing to current chest pain.  Will give Protonix 40  mg daily, and a GI cocktail x1 now.  7. FEN/GI.  Give her heart-healthy, carb-modified diet.  8. Prophylaxis.  FCDs and Protonix.           ______________________________  Arnette Norris, M.D.     TA/MEDQ  D:  07/15/2006  T:  07/16/2006  Job:   [id number]

## 2011-03-28 ENCOUNTER — Ambulatory Visit (INDEPENDENT_AMBULATORY_CARE_PROVIDER_SITE_OTHER): Payer: Medicare Other | Admitting: Family Medicine

## 2011-03-28 ENCOUNTER — Encounter: Payer: Self-pay | Admitting: Family Medicine

## 2011-03-28 VITALS — BP 137/76 | HR 84 | Temp 98.1°F | Wt 187.0 lb

## 2011-03-28 DIAGNOSIS — E1165 Type 2 diabetes mellitus with hyperglycemia: Secondary | ICD-10-CM

## 2011-03-28 DIAGNOSIS — I10 Essential (primary) hypertension: Secondary | ICD-10-CM

## 2011-03-28 DIAGNOSIS — E118 Type 2 diabetes mellitus with unspecified complications: Secondary | ICD-10-CM

## 2011-03-28 NOTE — Progress Notes (Signed)
  Subjective:    Patient ID: Wendy [name], female    DOB: [birth year], 70 y.o.   MRN: [id number]  HPI  1) HTN - 137/76 today;158/84 at last visit.  Reports that she is taking medications w/o side effects, though adherence has been an issue for her in the past. Denies any CP, HA, SOB, or blurred vision or LE edema. On metoprolol, norvasc, clonidine, lisinopril, HCTZ. Does not monitor salt intake, but has continued exercise on treadmill to 10 minutes per day BID 3-4 times per week.   2) 2)  DM - Fasting CBGs 70 - 200 (checks first thing in the morning). Reports that she is taking 70 units of Lantus (up from 60). On Metformin and Glipizide but adherence has, and continues to be an issue.  11.4 two visits ago, 13.1 in March 2012 and 10.8 today.  Has been referred to Dr. Valentina Lucks for pharmacy clinic on two occasions but did not make appointment either time.  Does not want to go to nutrition clinic because - she "knows how to eat right". Likes fried foods and sweets - snacks are cupcakes, brownies". Likes potatoes. States that she has difficulties with controlling portion size but again does not want to meet dietician. Reports that she will change behaviors because she "wants to live and be there for her family" .  Today denies polyuria, polydipsia, vision change. Reports that she saw the opthalmologist 1 year ago and had a "good report".  Pertinent past history reviewed   Review of Systems As per HPI     Objective:   Physical Exam Constitutional: She appears well-developed and well-nourished. No distress.  Cardiovascular: Normal rate, regular rhythm and normal heart sounds.   Pulmonary/Chest: Effort normal. No respiratory distress. She has no wheezes.  Extremities: no edema      Assessment & Plan:

## 2011-03-28 NOTE — Assessment & Plan Note (Signed)
Improved as compared to last visit though not at goal. Follow in three months. Continue medication regimen.

## 2011-03-28 NOTE — Assessment & Plan Note (Signed)
Improved as compared to last visit but not at goal. Follow up three months. Continue current regimen. Discussed diet and exercise recommendations.

## 2011-05-06 ENCOUNTER — Other Ambulatory Visit: Payer: Self-pay | Admitting: Family Medicine

## 2011-05-14 ENCOUNTER — Telehealth: Payer: Self-pay | Admitting: Family Medicine

## 2011-05-14 DIAGNOSIS — I10 Essential (primary) hypertension: Secondary | ICD-10-CM

## 2011-05-15 NOTE — Telephone Encounter (Signed)
Wendy [name], Is this document simply a refill request? Do I just have to sign it? I thought interns didn't receive refill requests. This is the 2nd one I have received and I just want to make sure I do the right thing with them.

## 2011-05-16 NOTE — Telephone Encounter (Signed)
Annie Main, just ignore it.  I don't know who sent it to you (can you see who it was?).  Jeani Hawking and I do all the intern's electronic refills.  I took care of this one yesterday.

## 2011-05-17 NOTE — Telephone Encounter (Signed)
Thank you :)

## 2011-05-19 ENCOUNTER — Other Ambulatory Visit: Payer: Self-pay | Admitting: Family Medicine

## 2011-05-24 ENCOUNTER — Other Ambulatory Visit: Payer: Self-pay | Admitting: Family Medicine

## 2011-05-24 NOTE — Telephone Encounter (Signed)
Refill request

## 2011-06-01 ENCOUNTER — Other Ambulatory Visit: Payer: Self-pay | Admitting: Family Medicine

## 2011-06-01 DIAGNOSIS — I10 Essential (primary) hypertension: Secondary | ICD-10-CM

## 2011-06-09 ENCOUNTER — Ambulatory Visit: Payer: Medicare Other | Admitting: Family Medicine

## 2011-06-17 ENCOUNTER — Other Ambulatory Visit: Payer: Self-pay | Admitting: Family Medicine

## 2011-06-20 ENCOUNTER — Other Ambulatory Visit: Payer: Self-pay | Admitting: Family Medicine

## 2011-06-20 ENCOUNTER — Ambulatory Visit: Payer: Medicare Other | Admitting: Family Medicine

## 2011-06-20 NOTE — Telephone Encounter (Signed)
Refill request

## 2011-06-30 LAB — BASIC METABOLIC PANEL
CO2: 29
Calcium: 9.4
Chloride: 97
Creatinine, Ser: 1
GFR calc Af Amer: >60
Glucose, Bld: 395 — ABNORMAL HIGH

## 2011-07-05 ENCOUNTER — Encounter: Payer: Self-pay | Admitting: Family Medicine

## 2011-07-06 ENCOUNTER — Encounter: Payer: Self-pay | Admitting: Family Medicine

## 2011-07-06 ENCOUNTER — Ambulatory Visit (INDEPENDENT_AMBULATORY_CARE_PROVIDER_SITE_OTHER): Payer: Medicare Other | Admitting: Family Medicine

## 2011-07-06 VITALS — BP 133/67 | HR 92 | Temp 98.1°F | Ht 61.75 in | Wt 193.6 lb

## 2011-07-06 DIAGNOSIS — I5189 Other ill-defined heart diseases: Secondary | ICD-10-CM | POA: Insufficient documentation

## 2011-07-06 DIAGNOSIS — F17200 Nicotine dependence, unspecified, uncomplicated: Secondary | ICD-10-CM

## 2011-07-06 DIAGNOSIS — R011 Cardiac murmur, unspecified: Secondary | ICD-10-CM

## 2011-07-06 DIAGNOSIS — I10 Essential (primary) hypertension: Secondary | ICD-10-CM

## 2011-07-06 DIAGNOSIS — Z23 Encounter for immunization: Secondary | ICD-10-CM

## 2011-07-06 DIAGNOSIS — I5032 Chronic diastolic (congestive) heart failure: Secondary | ICD-10-CM

## 2011-07-06 DIAGNOSIS — E1165 Type 2 diabetes mellitus with hyperglycemia: Secondary | ICD-10-CM

## 2011-07-06 DIAGNOSIS — E785 Hyperlipidemia, unspecified: Secondary | ICD-10-CM

## 2011-07-06 DIAGNOSIS — E669 Obesity, unspecified: Secondary | ICD-10-CM

## 2011-07-06 DIAGNOSIS — N63 Unspecified lump in unspecified breast: Secondary | ICD-10-CM

## 2011-07-06 DIAGNOSIS — I509 Heart failure, unspecified: Secondary | ICD-10-CM

## 2011-07-06 LAB — POCT GLYCOSYLATED HEMOGLOBIN (HGB A1C): Hemoglobin A1C: 10.1

## 2011-07-06 NOTE — Assessment & Plan Note (Signed)
Encouraged snuff cessation. Interested in continuing to have conversation about quitting at next visit but not currently interested. Advised on value of quitting.

## 2011-07-06 NOTE — Progress Notes (Signed)
  Subjective:    Patient ID: Wendy [name], female    DOB: [birth year], 70 y.o.   MRN: [id number]  HPI patient is a [id number] with diastolic CHF, DM II, HTN, HLD, presenting for diabetes follow up.   1. DM-last hgb A1c 10.8 3 months ago Blood Sugars- avg am 150-160. Lunch 230. Dinner 170-180. Night time 300.  Polyuria-mainly at night but also during day. No burning.  Polydipsia-drinks a lot of water Vision changes-no Neuropathy,Paresthesias-in toes Last eye exam-2 years since last eye exam. Interested in exam Hypoglycemia symptoms-2x in last 3 months with BS 80 and 112 this AM.   Pain in feet-laying down 9 pm each night pain shooting through feet sometimes into calves. 1 year. Slightly worse today than 1 year ago. 9/10 pain lasting 30 seconds and resolving with shaking feet. Never wakes up from sleep.   2. HTN/CHF-Denies any HA, SOB, blurry vision, , orthopnea (2 pillows unchanged), PND. LE edema if standing long periods. Mild chest discomfort with gas occasionally after eating.     pmhx-tobacco abuse-snuff noted Review of SystemsROS negative except as noted in HPI       Objective:   Physical Exam  Constitutional: She is oriented to person, place, and time. She appears well-developed and well-nourished. No distress.  HENT:  Head: Normocephalic and atraumatic.  Eyes: Right eye exhibits no discharge. Left eye exhibits no discharge. No scleral icterus.  Neck: Normal range of motion. Neck supple. No thyromegaly present.  Cardiovascular: Normal rate, regular rhythm, normal heart sounds and intact distal pulses.  Exam reveals no gallop and no friction rub.   No murmur heard. Pulses:      Radial pulses are 2+ on the right side, and 2+ on the left side.       No Carotid bruits  Pulmonary/Chest: Effort normal and breath sounds normal. No respiratory distress. She has no wheezes. She has no rales. She exhibits no tenderness.  Abdominal: Soft. Bowel sounds are normal. She exhibits no distension.  There is no tenderness. There is no rebound and no guarding.  Musculoskeletal: Normal range of motion. She exhibits no edema.  Neurological: She is alert and oriented to person, place, and time. She has normal reflexes. No sensory deficit.       Diabetic foot exam wnl  Skin: Skin is warm and dry.  Psychiatric: She has a normal mood and affect. Her behavior is normal.          Assessment & Plan:  Will return in 3 months for physical. . Patient with no history of abnormal pap. Patient over age 6. Will not need paps anymore, but will get bimanual exam.

## 2011-07-06 NOTE — Patient Instructions (Signed)
It was a pleasure to meet you today, Wendy [name]!   To review what we went over today: 1. Your blood pressure is pretty good considering you don't have your metoprolol. Please get that filled when you can.  2. For your diabetes-You set a goal today to exercise 10-15 minutes every other day. I believe you can achieve this goal. Also for your diabetes, try to eat a smaller plate of food to control your portion size. We will see if these changes can help your diabetes. 3. I would like to see you back in 3 months for a physical exam, diabetes, and blood pressure check.  4. You received a flu shot today.

## 2011-07-06 NOTE — Assessment & Plan Note (Signed)
Continue pravastatin-will check lipids at next visit.

## 2011-07-06 NOTE — Assessment & Plan Note (Signed)
Patient will increase exercise to everyday 10-15 minutes. Will focus on smaller portion size using smaller plates.

## 2011-07-06 NOTE — Assessment & Plan Note (Addendum)
Patient with downward trending a1c's over last visit. Patient has been trying to exercise and work on foods she is eating.  Plan to exercise 10-15 minutes a week every other day. Patient to limit portion sizes with smaller plates.  Will keep Lantus at 60 units as patient has been taking.  If patient can continue to lower a1c level in 3 months then will not initiate mealtime insulin. If patient plateaus will likely start mealtime lunch or dinner insulin.  Will discuss in 3 months for yearly physical.   Ambulatory referral today for optho as patient hasn't seen eye doctor in over 2 years.

## 2011-07-06 NOTE — Assessment & Plan Note (Signed)
Near goal at 133/67. Patient out of metoprolol for one week. Will get refilled tomorrow when gets SS check. Will not modify treatment today. No red flags.

## 2011-07-06 NOTE — Assessment & Plan Note (Addendum)
No red flags today. Exam without crackles or peripheral edema. Will continue to monitor. Patient on Ace inhibitor and beta blocker.

## 2011-07-13 ENCOUNTER — Other Ambulatory Visit: Payer: Self-pay | Admitting: Family Medicine

## 2011-07-13 ENCOUNTER — Encounter: Payer: Self-pay | Admitting: Family Medicine

## 2011-07-13 NOTE — Telephone Encounter (Signed)
Refilled Metformin.

## 2011-07-13 NOTE — Telephone Encounter (Signed)
Refill request

## 2011-08-16 ENCOUNTER — Other Ambulatory Visit: Payer: Self-pay | Admitting: Family Medicine

## 2011-08-16 DIAGNOSIS — E785 Hyperlipidemia, unspecified: Secondary | ICD-10-CM

## 2011-08-21 ENCOUNTER — Encounter (INDEPENDENT_AMBULATORY_CARE_PROVIDER_SITE_OTHER): Payer: Medicare Other | Admitting: Ophthalmology

## 2011-08-22 ENCOUNTER — Encounter: Payer: Self-pay | Admitting: Family Medicine

## 2011-08-22 DIAGNOSIS — E11319 Type 2 diabetes mellitus with unspecified diabetic retinopathy without macular edema: Secondary | ICD-10-CM | POA: Insufficient documentation

## 2011-08-25 ENCOUNTER — Other Ambulatory Visit: Payer: Self-pay | Admitting: Family Medicine

## 2011-08-25 DIAGNOSIS — I10 Essential (primary) hypertension: Secondary | ICD-10-CM

## 2011-09-04 ENCOUNTER — Encounter: Payer: Self-pay | Admitting: Family Medicine

## 2011-09-04 ENCOUNTER — Encounter (INDEPENDENT_AMBULATORY_CARE_PROVIDER_SITE_OTHER): Payer: Medicare Other | Admitting: Ophthalmology

## 2011-09-08 ENCOUNTER — Encounter: Payer: Self-pay | Admitting: Family Medicine

## 2011-09-18 ENCOUNTER — Other Ambulatory Visit: Payer: Self-pay | Admitting: Family Medicine

## 2011-09-18 ENCOUNTER — Ambulatory Visit: Payer: Medicare Other | Admitting: Family Medicine

## 2011-09-18 DIAGNOSIS — I10 Essential (primary) hypertension: Secondary | ICD-10-CM

## 2011-09-18 MED ORDER — HYDROCHLOROTHIAZIDE 25 MG PO TABS: 25.0000 mg | ORAL_TABLET | Freq: Every day | ORAL | Status: DC

## 2011-09-18 NOTE — Telephone Encounter (Signed)
Patient cancelled DM f/u and physical today. I will refill this medication but patient will need to have a follow up appointment for future refills. Has appointment in early January.

## 2011-09-18 NOTE — Telephone Encounter (Signed)
Refill request

## 2011-09-29 ENCOUNTER — Telehealth: Payer: Self-pay | Admitting: *Deleted

## 2011-09-29 DIAGNOSIS — I10 Essential (primary) hypertension: Secondary | ICD-10-CM

## 2011-09-29 MED ORDER — CLONIDINE HCL 0.2 MG PO TABS: 0.2000 mg | ORAL_TABLET | Freq: Two times a day (BID) | ORAL | Status: DC

## 2011-10-11 ENCOUNTER — Ambulatory Visit (INDEPENDENT_AMBULATORY_CARE_PROVIDER_SITE_OTHER): Payer: Medicare Other | Admitting: Family Medicine

## 2011-10-11 DIAGNOSIS — E1165 Type 2 diabetes mellitus with hyperglycemia: Secondary | ICD-10-CM | POA: Diagnosis not present

## 2011-10-11 DIAGNOSIS — E118 Type 2 diabetes mellitus with unspecified complications: Secondary | ICD-10-CM

## 2011-10-11 LAB — POCT GLYCOSYLATED HEMOGLOBIN (HGB A1C): Hemoglobin A1C: 10.7

## 2011-10-11 NOTE — Progress Notes (Signed)
Patient ID: Wendy [name], female   DOB: January 27, 1941, 71 y.o.   MRN: [id number]  Patient came for visit but was unable to complete visit due to daughter (patient's only ride) having to leave shortly after 3:30. Patient requests her husband to be seen and says she will reschedule. Did get a1c today though.   Garret Reddish, MD 10/11/2011 8:39 PM

## 2011-11-29 ENCOUNTER — Other Ambulatory Visit: Payer: Self-pay | Admitting: Family Medicine

## 2011-11-29 DIAGNOSIS — I10 Essential (primary) hypertension: Secondary | ICD-10-CM

## 2011-12-01 ENCOUNTER — Other Ambulatory Visit: Payer: Self-pay | Admitting: Family Medicine

## 2011-12-01 NOTE — Telephone Encounter (Signed)
Refill request

## 2011-12-02 ENCOUNTER — Other Ambulatory Visit: Payer: Self-pay | Admitting: Family Medicine

## 2011-12-03 NOTE — Telephone Encounter (Signed)
Refill request

## 2011-12-06 ENCOUNTER — Other Ambulatory Visit: Payer: Self-pay | Admitting: Family Medicine

## 2011-12-07 NOTE — Telephone Encounter (Signed)
Refill l request

## 2011-12-07 NOTE — Telephone Encounter (Signed)
Refill request

## 2011-12-12 ENCOUNTER — Ambulatory Visit (INDEPENDENT_AMBULATORY_CARE_PROVIDER_SITE_OTHER): Payer: Medicare Other | Admitting: Family Medicine

## 2011-12-12 ENCOUNTER — Encounter: Payer: Self-pay | Admitting: Family Medicine

## 2011-12-12 VITALS — BP 149/78 | HR 87 | Temp 98.1°F | Ht 61.75 in | Wt 194.0 lb

## 2011-12-12 DIAGNOSIS — E669 Obesity, unspecified: Secondary | ICD-10-CM

## 2011-12-12 DIAGNOSIS — E1139 Type 2 diabetes mellitus with other diabetic ophthalmic complication: Secondary | ICD-10-CM | POA: Diagnosis not present

## 2011-12-12 DIAGNOSIS — Z23 Encounter for immunization: Secondary | ICD-10-CM | POA: Diagnosis not present

## 2011-12-12 DIAGNOSIS — I5032 Chronic diastolic (congestive) heart failure: Secondary | ICD-10-CM | POA: Diagnosis not present

## 2011-12-12 DIAGNOSIS — IMO0002 Reserved for concepts with insufficient information to code with codable children: Secondary | ICD-10-CM

## 2011-12-12 DIAGNOSIS — E785 Hyperlipidemia, unspecified: Secondary | ICD-10-CM

## 2011-12-12 DIAGNOSIS — F17200 Nicotine dependence, unspecified, uncomplicated: Secondary | ICD-10-CM

## 2011-12-12 DIAGNOSIS — I10 Essential (primary) hypertension: Secondary | ICD-10-CM

## 2011-12-12 DIAGNOSIS — E1165 Type 2 diabetes mellitus with hyperglycemia: Secondary | ICD-10-CM | POA: Diagnosis not present

## 2011-12-12 DIAGNOSIS — E11319 Type 2 diabetes mellitus with unspecified diabetic retinopathy without macular edema: Secondary | ICD-10-CM

## 2011-12-12 DIAGNOSIS — I509 Heart failure, unspecified: Secondary | ICD-10-CM

## 2011-12-12 DIAGNOSIS — E118 Type 2 diabetes mellitus with unspecified complications: Secondary | ICD-10-CM

## 2011-12-12 DIAGNOSIS — H579 Unspecified disorder of eye and adnexa: Secondary | ICD-10-CM

## 2011-12-12 MED ORDER — TETANUS-DIPHTH-ACELL PERTUSSIS 5-2.5-18.5 LF-MCG/0.5 IM SUSP: 0.5000 mL | Freq: Once | INTRAMUSCULAR | Status: DC

## 2011-12-12 NOTE — Assessment & Plan Note (Addendum)
Poor control noted on last a1c. Patient resistant to starting mealtime  insulin at this time. Wants to continue to try exercise and eating changes. Will see patient in 2 months and discussed if a1c not <10, then would strongly recommend mealtime insulin.   Also encouraged patient to reschedule optho appointment with Dr. Oletta Lamas as she cancelled her previous appointment.   In addition to AVS summary, discussed increasing fruits and vegetables to 6 per day.

## 2011-12-12 NOTE — Progress Notes (Signed)
  Subjective:    Patient ID: Wendy [name], female    DOB: 10-05-1940, 71 y.o.   MRN: [id number]  HPI 64 F here for HTN, DM follow up.    1. DIABETES Medications taking and tolerating-yes per patient but question compliance. Have received notification per pharmacy questioning fill dates of metformin.  Blood Sugars per patient-typically 80-120 fasting, says 2 hour post prandials can approach 300. Usually only checks 1-2 times per day.  Hypoglycemia symptoms (shaky, sweaty, hungry,  anxious, tremor, palpitations, confusion, behavior change)-NONE Last microalbumin/on ace inhibitor-on ace  2. HTN-Denies any CP, HA, SOB, blurry vision, LE edema, transient weakness, orthopnea, PND.  BP Readings from Last 3 Encounters:  12/12/11 149/78  07/06/11 133/67  03/28/11 137/76  Compliant with medications-yes but ran out recently and just refilled meds.   3. TObacco abuse/snuff-not interested in quitting at this time.   4. HLD-complains of myalgias recurrently when on statins-has stopped taking.  5. Bowel movements-have become irregular approximately every 3-5 days when typically every 1-2 days in past 5 years. Encouraged increased fiber intake. Patient has had a colonoscopy 7 years ago and willing to do repeat in 3 years.   6. Stress-PHQ 9 score of 5 but 2 points for sleep which has never been great for patient and 1 point for overeating which is also a long term issue. Says has some stress because husband has started drinking 3 beers per day at least and has bene coughing some more (says always coughs when he drinks). She wants him to take care of himself but he wont listen to her. She has felt somewhat down because of this but realizes she cannot bear the burden herself.   Review of Systems negative except as noted in HPI      Objective:   Physical Exam  Constitutional: She is oriented to person, place, and time. She appears well-developed and well-nourished. No distress.  HENT:  Head:  Normocephalic and atraumatic.  Neck: Normal range of motion. Neck supple.  Cardiovascular: Normal rate and regular rhythm.  Exam reveals no gallop and no friction rub.   No murmur heard. Pulmonary/Chest: Effort normal and breath sounds normal. No respiratory distress. She has no wheezes. She has no rales.  Abdominal: Soft. Bowel sounds are normal.  Musculoskeletal: Normal range of motion. She exhibits edema (minimal/trace).  Neurological: She is alert and oriented to person, place, and time.  Skin: Skin is warm and dry.  Psychiatric: She has a normal mood and affect. Her behavior is normal.   BP 149/78  Pulse 87  Temp(Src) 98.1 F (36.7 C) (Oral)  Ht 5' 1.75" (1.568 m)  Wt 194 lb (87.998 kg)  BMI 35.77 kg/m2  Assessment & Plan:  Gave rx for TDAP and handout on obtaining.

## 2011-12-12 NOTE — Patient Instructions (Signed)
Dear Mrs. [name],   It was great to see you today. Thank you for coming to clinic. Please read below regarding the issues that we discussed.   1. Your blood pressure is slightly up today. I know you just got your refills so I would like to see you back in a 2 months to recheck. If it is still high at that time, we may need to adjust your medicines.   2. For your diabetes-You set a goal today to exercise 10-15 minutes every day. I believe you can achieve this goal. Also for your diabetes, please continue to try to eat a smaller plate of food to control your portion size. In 2 months, if your a1c has not improved, we will need to discuss mealtime insulin. Please keep a log of your sugars.  3. We will check your cholesterol today. If it is elevated, I will call you and discuss an option to help your muscle pain.  4. You received a pneumonia shot today.  5. Let us know when you are ready to quit chewing tobacco. We are always ready to help you.   Please follow up in clinic in 8 weeks . Please call earlier if you have any questions or concerns.   Sincerely,  Dr. Garret Reddish

## 2011-12-12 NOTE — Assessment & Plan Note (Signed)
SBP above goal today but most recently with SBP <140 regularly. Patient on 5 different medications. Question compliance. Patient says just got meds refilled-uncertain if she has restarted medicines. Will recheck in 2 months and consider increase.

## 2011-12-12 NOTE — Assessment & Plan Note (Signed)
Continues to be interested in talking about quitting but still not yet ready.

## 2011-12-12 NOTE — Assessment & Plan Note (Signed)
No signs or symptoms of fluid overload. Will monitor.

## 2011-12-12 NOTE — Assessment & Plan Note (Signed)
Encouraged patient to follow up.

## 2011-12-12 NOTE — Assessment & Plan Note (Addendum)
Patient reports repeated muscle cramps on different statins including Lipitor and most recently Pravastatin. Will check LDL today. IF >100 discussed using coenzyme q10 alongside statin with patient.

## 2011-12-12 NOTE — Assessment & Plan Note (Signed)
SEE AVS. Also discussed fruits/veggies increased to 6 per day.

## 2011-12-19 ENCOUNTER — Encounter: Payer: Self-pay | Admitting: Family Medicine

## 2011-12-19 ENCOUNTER — Telehealth: Payer: Self-pay | Admitting: Family Medicine

## 2011-12-19 DIAGNOSIS — E785 Hyperlipidemia, unspecified: Secondary | ICD-10-CM

## 2011-12-19 MED ORDER — ROSUVASTATIN CALCIUM 5 MG PO TABS: 5.0000 mg | ORAL_TABLET | Freq: Every day | ORAL | Status: DC

## 2011-12-19 NOTE — Telephone Encounter (Signed)
Called patient and informed LDL 181 with goal <100. Patient agreeable to try Crestor 5mg  and titrate up slowly. Advised patient to call of [name] if she decides to stop medication due to side effects. Would consider coenzyme q10 or L carnitine at that time.   Garret Reddish, MD, PGY1 12/19/2011 11:45 AM

## 2011-12-21 DIAGNOSIS — H40159 Residual stage of open-angle glaucoma, unspecified eye: Secondary | ICD-10-CM | POA: Diagnosis not present

## 2011-12-21 DIAGNOSIS — H353190 Nonexudative age-related macular degeneration, unspecified eye, stage unspecified: Secondary | ICD-10-CM | POA: Diagnosis not present

## 2011-12-21 DIAGNOSIS — H04129 Dry eye syndrome of unspecified lacrimal gland: Secondary | ICD-10-CM | POA: Diagnosis not present

## 2011-12-21 DIAGNOSIS — H26499 Other secondary cataract, unspecified eye: Secondary | ICD-10-CM | POA: Diagnosis not present

## 2011-12-21 DIAGNOSIS — L719 Rosacea, unspecified: Secondary | ICD-10-CM | POA: Diagnosis not present

## 2011-12-27 ENCOUNTER — Other Ambulatory Visit: Payer: Self-pay | Admitting: Family Medicine

## 2012-02-06 ENCOUNTER — Other Ambulatory Visit: Payer: Self-pay | Admitting: Family Medicine

## 2012-02-06 NOTE — Telephone Encounter (Signed)
Refilled clonidine and lisinopril. Patient has appointment on 5/23.   Garret Reddish, MD, PGY1 02/06/2012 8:27 AM

## 2012-02-19 ENCOUNTER — Other Ambulatory Visit: Payer: Self-pay | Admitting: Family Medicine

## 2012-02-20 NOTE — Telephone Encounter (Signed)
Refilled lantus

## 2012-02-22 ENCOUNTER — Ambulatory Visit: Payer: Medicare Other | Admitting: Family Medicine

## 2012-03-18 ENCOUNTER — Encounter: Payer: Self-pay | Admitting: Family Medicine

## 2012-03-18 ENCOUNTER — Ambulatory Visit (INDEPENDENT_AMBULATORY_CARE_PROVIDER_SITE_OTHER): Payer: Medicare Other | Admitting: Family Medicine

## 2012-03-18 VITALS — BP 150/76 | HR 71 | Temp 98.4°F | Ht 61.75 in | Wt 193.0 lb

## 2012-03-18 DIAGNOSIS — F17200 Nicotine dependence, unspecified, uncomplicated: Secondary | ICD-10-CM

## 2012-03-18 DIAGNOSIS — E785 Hyperlipidemia, unspecified: Secondary | ICD-10-CM | POA: Diagnosis not present

## 2012-03-18 DIAGNOSIS — E1165 Type 2 diabetes mellitus with hyperglycemia: Secondary | ICD-10-CM

## 2012-03-18 DIAGNOSIS — I10 Essential (primary) hypertension: Secondary | ICD-10-CM | POA: Diagnosis not present

## 2012-03-18 DIAGNOSIS — E118 Type 2 diabetes mellitus with unspecified complications: Secondary | ICD-10-CM | POA: Diagnosis not present

## 2012-03-18 MED ORDER — SIMVASTATIN 40 MG PO TABS: 40.0000 mg | ORAL_TABLET | Freq: Every day | ORAL | Status: DC

## 2012-03-18 NOTE — Patient Instructions (Addendum)
Dear Wendy [name],   It was great to see you today. Thank you for coming to clinic. Please read below regarding the issues that we discussed.   1. I want to see you back in 1 month to see how you are doing with exercising by walking everyday and your food choices. I think you will likely need mealtime insulin but you wanted to hold off on that for one more visit. If your blood sugars are doing better, we could potentially continue with your healthy lifestyle changes. See instructions below for healthy lifestyle choices. I would like to check your kidney function today to make sure your diabetes isn't damaging your kidneys.  2. For your cholesterol, I am going to send in for a cheaper cholesterol medicine. Hopefully we can get your cholesterol down with the medicine, healthy food choices, and exercise.  3. For your blood pressure, I am not going to make any medication changes today. Please make an appointment with Dr. Valentina Lucks for 24-hour blood pressure monitoring. What to expect at your blood pressure monitoring visit:  Please wear a short-sleeved, loose fitting shirt for the day. Try to take a shower/bathe before you come in for the appointment so that you don't have to take the monitor off during the day. On the day of your appointment, a portable blood pressure cuff will be placed on your arm and you will wear it for 24 hours. It will inflate multiple times during the day and night which will give [name] a better idea of your overall blood pressure. The next day you will return to the clinic to take the monitor off and the results of the blood pressure study will be shown to you. If medication changes are needed, they will be made that day.   4. Please continue to consider quitting Snuff. Let us know when you are ready.  5. Make sure to see your eye doctor for follow up!    Please follow up in clinic in 4 weeks . Please call earlier if you have any questions or concerns.   Sincerely,  Dr. Garret Reddish   My 5 to Fitness!  5: fruits and vegetables per day (work on 9 per day if you are at 5) 4: exercise 4-5 times per week for at least 30 minutes (walking counts!) 3: meals per day (don't skip breakfast!) 2: habits to quit -smoking -excess alcohol use (men >2 beer/day; women >1beer/day) 1: sweet per day (2 cookies, 1 small cup of ice cream, 12 oz soda)  These are general tips for healthy living. Try to start with 1 or 2 habit TODAY and make it a part of your life for several months. You set a goal today to work on: Exercise  Once you have 1 or 2 habits down for several months, try to begin working on your next healthy habit. With every single step you take, you will be leading a healthier lifestyle!

## 2012-03-18 NOTE — Progress Notes (Signed)
  Subjective:    Patient ID: Wendy [name], female    DOB: Feb 28, 1941, 71 y.o.   MRN: [id number]  HPI  1. Hypertension- BP Readings from Last 3 Encounters:  03/18/12 150/76  12/12/11 149/78  07/06/11 133/67   Home BP monitoring-no Compliant with medications-yes without side effects Denies any CP, HA, SOB, blurry vision, LE edema, transient weakness, orthopnea, PND.   2. DIABETES Type II Medications taking and tolerating-yes Blood Sugars per patient-fasting-70-130. Checks 4x per day. Before lunch-200-260. Before dinner-300. 9pm-around 120.   Lunch and dinner biggest meal of day Hypoglycemia symptoms (shaky, sweaty, hungry,  anxious, tremor, palpitations, confusion, behavior change)-2 to 3 times in the morning over the last month  Last eye exam-needs to follow up with repeat visit Last foot exam-07/06/11 Last microalbumin/on ace inhibitor-on ace Daily foot monitoring-yes  ROS- (-)Polyuria, (-) Polydipsia,  (3x nightly)nocturia, (-) Vision changes, (-) feet or hand numbness/pain/tingling   Diabetic Labs:  Lab Results  Component Value Date   HGBA1C 10.1 03/18/2012   HGBA1C 10.7 10/11/2011   HGBA1C 10.1 07/06/2011   Lab Results  Component Value Date   LDLCALC 180* 09/03/2009   CREATININE 0.95 09/03/2009   Last microalbumin: No results found for this basename: MICROALBUR, [id number]    3. Cholesterol-took crestor for 1 month but cost prevented future refills. Would like to try another medicine.   4. Tobacco abuse-not interested in quitting at this time with "everything else going on"   Review of Systems -See HPI  Past Medical History-smoking status noted: nonsmoker, uses Sn[employer]. Reviewed problem list.  Medications- reviewed and updated Chief complaint-noted    Objective:   Physical Exam BP 150/76  Pulse 71  Temp 98.4 F (36.9 C) (Oral)  Ht 5' 1.75" (1.568 m)  Wt 193 lb (87.544 kg)  BMI 35.59 kg/m2 Constitutional: She is oriented to person, place, and time. She appears  well-developed and well-nourished. No distress.  Head: Normocephalic and atraumatic.  Neck: Normal range of motion. Neck supple.  Cardiovascular: Normal rate and regular rhythm.  Exam reveals no gallop and no friction rub. No murmur heard. Pulmonary/Chest: Effort normal and breath sounds normal. No respiratory distress. She has no wheezes. She has no rales.  Abdominal: Soft. Bowel sounds are normal. No tenderness.  Musculoskeletal: Normal range of motion. She exhibits edema (minimal/trace).  Neurological: She is alert and oriented to person, place, and time.  Skin: Skin is warm and dry.     Assessment & Plan:

## 2012-03-19 LAB — BASIC METABOLIC PANEL
BUN: 19 mg/dL (ref 6–23)
Chloride: 100 mEq/L (ref 96–112)
Potassium: 4.1 mEq/L (ref 3.5–5.3)
Sodium: 139 mEq/L (ref 135–145)

## 2012-03-19 NOTE — Assessment & Plan Note (Addendum)
Still not ready to discuss quitting at this time. Counseled on quitting.

## 2012-03-19 NOTE — Assessment & Plan Note (Signed)
Concern for noncompliance. Patient on 5 separate blood pressure medications. If noncompliance wasn't one of my chief concerns, would consider workup for causes of resistant hypertension. Patient previously had been closer to goal but elevated in recent months. Patient reports healthier eating, smaller portion size, and small amount of exercise but weight is up from last year although stable this year.   Have referred to Dr. Graylin Shiver clinic and encouraged continued lifestyle changes per AVS and DM instructions.

## 2012-03-19 NOTE — Assessment & Plan Note (Addendum)
A1c slightly improved to 10.1 from 10.7. Discussed at last visit starting mealtime insulin as other therapies likely not to get patient to goal. Patient resistant to mealtime insulin. Discussed only using at 1 meal to start with but likely will need at all meals. Patient is only exercising 5 minutes every other day. SHe says walking usually helps her so she is going to try doing this with a friend. Gave [employer] handout in AVS. Concerning her hypoglycemia symptoms in AM, likely due to Blood sugar's running higher regularly as blood sugars not in dangerous level-70s and 80s when she feels that way. I am also concerned about noncompliance.   BMET today with Cr <1. Stable from previous. Continue ace inhibitor.   Once again encouraged patient to follow up with Dr. Oletta Lamas for optho given diabetic retinopathy.

## 2012-03-19 NOTE — Assessment & Plan Note (Signed)
Patient could not afford crestor. Started simvastatin 40mg . Hopefully patient can tolerate and afford medication-sent to [facility] for $4 plan. Once again, could consider coenzyme q10 or L carnitine. Suspect will have to go up to 80mg  and may still not be at goal of < 100.

## 2012-03-29 ENCOUNTER — Ambulatory Visit: Payer: Medicare Other | Admitting: Pharmacist

## 2012-04-03 ENCOUNTER — Other Ambulatory Visit: Payer: Self-pay | Admitting: Family Medicine

## 2012-04-03 DIAGNOSIS — E1165 Type 2 diabetes mellitus with hyperglycemia: Secondary | ICD-10-CM

## 2012-04-03 NOTE — Telephone Encounter (Signed)
Refilled glipizide. Would prefer mealtime insulin and stopping glipizide but patient not ready at this time.

## 2012-04-05 ENCOUNTER — Other Ambulatory Visit: Payer: Self-pay | Admitting: Emergency Medicine

## 2012-04-05 NOTE — Telephone Encounter (Signed)
Refilled HCTZ

## 2012-04-11 ENCOUNTER — Ambulatory Visit (INDEPENDENT_AMBULATORY_CARE_PROVIDER_SITE_OTHER): Payer: Medicare Other | Admitting: Pharmacist

## 2012-04-11 ENCOUNTER — Encounter: Payer: Self-pay | Admitting: Pharmacist

## 2012-04-11 VITALS — BP 137/81 | HR 72 | Ht 62.5 in | Wt 192.3 lb

## 2012-04-11 DIAGNOSIS — I10 Essential (primary) hypertension: Secondary | ICD-10-CM

## 2012-04-11 DIAGNOSIS — E118 Type 2 diabetes mellitus with unspecified complications: Secondary | ICD-10-CM

## 2012-04-11 DIAGNOSIS — E1165 Type 2 diabetes mellitus with hyperglycemia: Secondary | ICD-10-CM

## 2012-04-11 DIAGNOSIS — F17200 Nicotine dependence, unspecified, uncomplicated: Secondary | ICD-10-CM

## 2012-04-11 NOTE — Progress Notes (Signed)
  Subjective:    Patient ID: Wendy [name], female    DOB: [birth year], 71 y.o.   MRN: [id number]  HPI  Patient arrives for Amb BP monitoring.  However in reviewing chart and discussing goals of care with patient it was decided to prioritize glucose management and possibly tobacco cessation prior to AMb BP monitoring given reading today which was near goal.   Patient reports being diagnosed with DM and taking insulin for > 10 years.    Patient reports using chew tobacco since age 18 or 45 AND using chew tobacco daily since age 63 or 12.   She reports her longest period of quitting tobacco was ~1 week.   Report adherence will ALL medications AND also reports working on diet including reducing intake of pork AND breakfast meats.     Review of Systems     Objective:   Physical Exam        Assessment & Plan:   Hypertension chronic and improved control today likely due to medication adherence and lower salt intake.  Continue to monitor BP AND reconsider Amb BP monitor after work on glycemic control AND possibly tobacco cessation.   Diabetes Chronic and poorly controlled despite lantus insulin and glipizide therapy.   Recommend basal Lantus and Single Bolus injection (Novolog) to attempt improved control in this patient who is reluctant to do numerous shots per day.  Due to insufficient funds this patient is most interested in attempting use of sample of Novolog to start and assess improvement.  Willing to try this next week when sample vial will be available.  Next week - Decrease Lantus to 45 units once daily in the morning.  Start Novolog 10 units prior to your evening meal.  Follow up with Pharmacist Clinic for Blood Glucose AND BP follow up in Early August.   Tobacco Use - Chronic and possibly willing to consider cessation - Reassess at next visit.

## 2012-04-11 NOTE — Assessment & Plan Note (Signed)
Hypertension chronic and improved control today likely due to medication adherence and lower salt intake.  Continue to monitor BP AND reconsider Amb BP monitor after work on glycemic control AND possibly tobacco cessation.

## 2012-04-11 NOTE — Patient Instructions (Addendum)
Exercise at least 3 days per week on the treadmill or walking as exercise.  Think hard about quitting tobacco. Continue all medications the same today.  Next week - Decrease Lantus to 45 units once daily in the morning.  Start Novolog 10 units prior to your evening meal.  Follow up with Pharmacist Clinic for Blood Glucose AND BP follow up in Early August.

## 2012-04-11 NOTE — Assessment & Plan Note (Signed)
Diabetes Chronic and poorly controlled despite lantus insulin and glipizide therapy.   Recommend basal Lantus and Single Bolus injection (Novolog) to attempt improved control in this patient who is reluctant to do numerous shots per day.  Due to insufficient funds this patient is most interested in attempting use of sample of Novolog to start and assess improvement.  Willing to try this next week when sample vial will be available.  Next week - Decrease Lantus to 45 units once daily in the morning.  Start Novolog 10 units prior to your evening meal.  Follow up with Pharmacist Clinic for Blood Glucose AND BP follow up in Early August.

## 2012-04-11 NOTE — Assessment & Plan Note (Signed)
Tobacco Use - Chronic and possibly willing to consider cessation - Reassess at next visit.

## 2012-04-12 NOTE — Progress Notes (Signed)
  Subjective:    Patient ID: Wendy [name], female    DOB: 1941-06-12, 71 y.o.   MRN: [id number]  HPIReviewed and agree with Dr. Graylin Shiver documentation and management.    Review of Systems     Objective:   Physical Exam        Assessment & Plan:

## 2012-04-13 ENCOUNTER — Other Ambulatory Visit: Payer: Self-pay | Admitting: Family Medicine

## 2012-04-13 DIAGNOSIS — I10 Essential (primary) hypertension: Secondary | ICD-10-CM

## 2012-04-15 NOTE — Telephone Encounter (Signed)
Refilled clonidine.

## 2012-04-17 ENCOUNTER — Ambulatory Visit: Payer: Medicare Other | Admitting: Family Medicine

## 2012-04-22 ENCOUNTER — Ambulatory Visit: Payer: Medicare Other | Admitting: Family Medicine

## 2012-04-25 ENCOUNTER — Other Ambulatory Visit: Payer: Self-pay | Admitting: Family Medicine

## 2012-05-22 ENCOUNTER — Ambulatory Visit: Payer: Medicare Other | Admitting: Family Medicine

## 2012-05-26 ENCOUNTER — Other Ambulatory Visit: Payer: Self-pay | Admitting: Family Medicine

## 2012-06-26 ENCOUNTER — Other Ambulatory Visit: Payer: Self-pay | Admitting: Family Medicine

## 2012-08-02 ENCOUNTER — Ambulatory Visit: Payer: Medicare Other | Admitting: Family Medicine

## 2012-08-07 ENCOUNTER — Ambulatory Visit: Payer: Medicare Other | Admitting: Family Medicine

## 2012-08-10 ENCOUNTER — Encounter: Payer: Self-pay | Admitting: Family Medicine

## 2012-08-12 ENCOUNTER — Encounter: Payer: Self-pay | Admitting: Home Health Services

## 2012-08-13 ENCOUNTER — Other Ambulatory Visit: Payer: Self-pay | Admitting: Family Medicine

## 2012-08-13 ENCOUNTER — Encounter: Payer: Self-pay | Admitting: Home Health Services

## 2012-08-16 ENCOUNTER — Ambulatory Visit (INDEPENDENT_AMBULATORY_CARE_PROVIDER_SITE_OTHER): Payer: Medicare Other | Admitting: Family Medicine

## 2012-08-16 ENCOUNTER — Encounter: Payer: Self-pay | Admitting: Family Medicine

## 2012-08-16 VITALS — BP 141/60 | HR 66 | Temp 98.0°F | Ht 62.0 in | Wt 191.5 lb

## 2012-08-16 DIAGNOSIS — E1165 Type 2 diabetes mellitus with hyperglycemia: Secondary | ICD-10-CM

## 2012-08-16 DIAGNOSIS — E785 Hyperlipidemia, unspecified: Secondary | ICD-10-CM

## 2012-08-16 DIAGNOSIS — F17200 Nicotine dependence, unspecified, uncomplicated: Secondary | ICD-10-CM | POA: Diagnosis not present

## 2012-08-16 LAB — POCT GLYCOSYLATED HEMOGLOBIN (HGB A1C): Hemoglobin A1C: 10.4

## 2012-08-16 NOTE — Progress Notes (Signed)
Subjective:   1. DIABETES Type II Medications taking and tolerating-taking Lantus 40 units. Instructed to take 45 with 10 of Novolog but noncompliant and states she will not take more than 1 shot per day. On further discussion, patient reveals that history of hypoglycemia with taking 65 units of Lantus is what makes her not want to take another shot. When I explained that 10 units novolog at dinner would not likely cause that, she was more agreeable to trying but still preferred to wait until net visit.  Blood Sugars per patient-fasting-AM 140-200, 2 hour post lunch 200, 2 hour post dinner 200 Diet-poor compliance, chocolate and sweets heavy intake Regular Exercise-None  Last eye exam-needs to follow up with repeat visit. HM states has not had since 08/2011.  Last foot exam-08/16/12 Last microalbumin/on ace inhibitor-on ace Daily foot monitoring-yes  ROS- (neg)Polyuria/Polydipsia/nocturia, (neg) Vision changes, (some tingling/pain in feet) feet or hand numbness/pain/tingling. Hypoglycemia symptoms (shaky, sweaty, hungry,  anxious, tremor, palpitations, confusion, behavior change)-none.   Diabetic Labs:  Lab Results  Component Value Date   HGBA1C 10.4 08/16/2012   HGBA1C 10.1 03/18/2012   HGBA1C 10.7 10/11/2011   2. Hyperlipidemia-patient states lost rx for simvastatin. Requests another refill and states will be compliant.   ROS--See HPI  Past Medical History-smoking status noted: nonsmoker, does use chewing tobacco and unwilling to quit at this time.  Reviewed problem list.  Medications- reviewed and updated Chief complaint-noted  Objective: BP 141/60  Pulse 66  Temp 98 F (36.7 C) (Oral)  Ht 5\' 2"  (1.575 m)  Wt 191 lb 8 oz (86.864 kg)  BMI 35.03 kg/m2 Gen: NAD, resting comfortably in chair Diabetic foot exam: 2+ pulses DP, no callous formation, no loss of sensation on monofilament exam.  Ext: moves all extremities, trace edema  Assessment/Plan: See problem oriented  charted  Health Maint-patient refused flu/TDAP today as stated she did not have time.

## 2012-08-16 NOTE — Patient Instructions (Signed)
I want you to consider starting a dinner time insulin as previously discussed. You need a diabetic eye exam-we will refer you for that today.  I will send in your new prescription for your cholesterol medicine and we need to follow up the level in 6 weeks. We may need to get you a stronger medication to help control your cholesterol.  We need to give you a flu shot next week.   Make sure to make that appointment for 1 week so we can talk more.   My 5 to Fitness!  5: fruits and vegetables per day (work on 9 per day if you are at 5) 4: exercise 4-5 times per week for at least 30 minutes (walking counts!) 3: meals per day (don't skip breakfast!) 2: habits to quit  -smoking  -excess alcohol use (men >2 beer/day; women >1beer/day) 1: sweet per day (2 cookies, 1 small cup of ice cream, 12 oz soda)  These are general tips for healthy living. Try to start with 1 or 2 habit TODAY and make it a part of your life for several months. Once you have 1 or 2 habits down for several months, try to begin working on your next healthy habit. With every single step you take, you will be leading a healthier lifestyle!

## 2012-08-17 MED ORDER — PRAVASTATIN SODIUM 80 MG PO TABS: 80.0000 mg | ORAL_TABLET | Freq: Every day | ORAL | Status: DC

## 2012-08-17 NOTE — Assessment & Plan Note (Addendum)
Will attempt pravastatin 80 mg and recheck in 2 months (patient states could not afford crestor). Will not check LDL at this time as patient not on statin. Suspect poorly controlled.

## 2012-08-17 NOTE — Assessment & Plan Note (Addendum)
Poorly controlled likely due to noncompliance. Have heard from pharmacy on multiple occasions that patient does not regularly fill metformin. Advised patient to at least use 45 units as instructed by Dr. Valentina Lucks previously. Discussed 10 units with nighttime meal but patient resistant to non-lantus insulin. Appears due to fear of hypoglycemia she previously experienced. Patient wanted to defer until next visit making this change which she claims she will make within 2 weeks. Given patient on insulin, I would also consider stopping glipizide and instead titrating insulin.

## 2012-08-17 NOTE — Assessment & Plan Note (Signed)
Counseled on need to quit use. Patient not ready at this time.

## 2012-08-19 ENCOUNTER — Other Ambulatory Visit: Payer: Self-pay | Admitting: Family Medicine

## 2012-09-02 ENCOUNTER — Other Ambulatory Visit: Payer: Self-pay | Admitting: Family Medicine

## 2012-09-07 ENCOUNTER — Other Ambulatory Visit: Payer: Self-pay | Admitting: Family Medicine

## 2012-09-11 ENCOUNTER — Other Ambulatory Visit: Payer: Self-pay | Admitting: Family Medicine

## 2012-10-07 ENCOUNTER — Other Ambulatory Visit: Payer: Self-pay | Admitting: Family Medicine

## 2012-10-20 ENCOUNTER — Other Ambulatory Visit: Payer: Self-pay | Admitting: Family Medicine

## 2012-10-23 ENCOUNTER — Other Ambulatory Visit: Payer: Self-pay | Admitting: Family Medicine

## 2012-10-25 ENCOUNTER — Ambulatory Visit (INDEPENDENT_AMBULATORY_CARE_PROVIDER_SITE_OTHER): Payer: Medicare Other | Admitting: Family Medicine

## 2012-10-25 ENCOUNTER — Encounter: Payer: Self-pay | Admitting: Family Medicine

## 2012-10-25 VITALS — BP 146/76 | HR 89 | Temp 98.7°F | Ht 62.0 in | Wt 192.0 lb

## 2012-10-25 DIAGNOSIS — I10 Essential (primary) hypertension: Secondary | ICD-10-CM

## 2012-10-25 DIAGNOSIS — Z23 Encounter for immunization: Secondary | ICD-10-CM | POA: Diagnosis not present

## 2012-10-25 DIAGNOSIS — E785 Hyperlipidemia, unspecified: Secondary | ICD-10-CM | POA: Diagnosis not present

## 2012-10-25 DIAGNOSIS — E1165 Type 2 diabetes mellitus with hyperglycemia: Secondary | ICD-10-CM

## 2012-10-25 DIAGNOSIS — IMO0001 Reserved for inherently not codable concepts without codable children: Secondary | ICD-10-CM | POA: Diagnosis not present

## 2012-10-25 LAB — COMPREHENSIVE METABOLIC PANEL
Albumin: 3.8 g/dL (ref 3.5–5.2)
Alkaline Phosphatase: 81 U/L (ref 39–117)
BUN: 15 mg/dL (ref 6–23)
CO2: 29 mEq/L (ref 19–32)
Glucose, Bld: 186 mg/dL — ABNORMAL HIGH (ref 70–99)
Potassium: 3.8 mEq/L (ref 3.5–5.3)
Sodium: 140 mEq/L (ref 135–145)
Total Bilirubin: 0.4 mg/dL (ref 0.3–1.2)
Total Protein: 7 g/dL (ref 6.0–8.3)

## 2012-10-25 LAB — LDL CHOLESTEROL, DIRECT: Direct LDL: 91 mg/dL

## 2012-10-25 MED ORDER — AMLODIPINE BESYLATE 10 MG PO TABS: 10.0000 mg | ORAL_TABLET | Freq: Every day | ORAL | Status: DC

## 2012-10-25 MED ORDER — CLONIDINE HCL 0.2 MG PO TABS: 0.2000 mg | ORAL_TABLET | Freq: Two times a day (BID) | ORAL | Status: DC

## 2012-10-25 MED ORDER — HYDROCHLOROTHIAZIDE 25 MG PO TABS: 25.0000 mg | ORAL_TABLET | Freq: Every day | ORAL | Status: DC

## 2012-10-25 MED ORDER — INSULIN GLARGINE 100 UNIT/ML ~~LOC~~ SOLN: 52.0000 [IU] | Freq: Every day | SUBCUTANEOUS | Status: DC

## 2012-10-25 MED ORDER — METOPROLOL SUCCINATE ER 100 MG PO TB24: 100.0000 mg | ORAL_TABLET | Freq: Every day | ORAL | Status: DC

## 2012-10-25 MED ORDER — GLIPIZIDE 10 MG PO TABS: 10.0000 mg | ORAL_TABLET | Freq: Two times a day (BID) | ORAL | Status: DC

## 2012-10-25 MED ORDER — METFORMIN HCL 1000 MG PO TABS: 1000.0000 mg | ORAL_TABLET | Freq: Two times a day (BID) | ORAL | Status: DC

## 2012-10-25 MED ORDER — LISINOPRIL 40 MG PO TABS: 40.0000 mg | ORAL_TABLET | Freq: Every day | ORAL | Status: DC

## 2012-10-25 MED ORDER — PRAVASTATIN SODIUM 80 MG PO TABS: 80.0000 mg | ORAL_TABLET | Freq: Every day | ORAL | Status: DC

## 2012-10-25 NOTE — Assessment & Plan Note (Signed)
Poorly controlled previously. Continue pravastatin. Check direct ldl today.

## 2012-10-25 NOTE — Assessment & Plan Note (Signed)
Goal 140/90 as diabetic and other risk factors for CV disease. Poorly controlled today as off amlodipine. Patient to fill today. i will also refill all meds x 1 year at this visit.

## 2012-10-25 NOTE — Progress Notes (Signed)
Subjective:   1. DIABETES Type II Medications taking and tolerating-taking Lantus 50 units. Not taking any novolog-does not want to take because regimen of medicines is too complicated but willing to try again at next appointment if a1c not improved.  Metformin-taking 1 pill a day instead of 4 until 1 week ago.  Glyburide taking 1 a day instead of 2 a day until 1 week ago.  Blood sugars went higher as a result. Just started back last week. She was trying to simplify regimen Eating better. Cut back on chocolates and sweets.  AM sugars 120-160 as a result of taking all medicine and eating better.  Takes 10 minutes after lunch-280.  At bedtime-240 4 hours after meal Regular Exercise-walks on treadmills 5-10 minutes 3 days per week.   Last eye exam-needs to follow up with repeat visit. HM states has not had since 08/2011.  Last foot exam-08/16/12 Last microalbumin/on ace inhibitor-on ace Daily foot monitoring-yes  ROS- (yes when not taking medicine as prescribed but now doing better)Polyuria/Polydipsia/nocturia, (neg) Vision changes, (some tingling/pain in feet) feet or hand numbness/pain/tingling. Hypoglycemia symptoms (shaky, sweaty, hungry,  anxious, tremor, palpitations, confusion, behavior change)-none.   Diabetic Labs:  Lab Results  Component Value Date   HGBA1C 10.4 08/16/2012   HGBA1C 10.1 03/18/2012   HGBA1C 10.7 10/11/2011   2. Hyperlipidemia-taking simvastatin daily. Starting 4 weeks ago. No muscle cramps or muscle wekness.   3. Hypertension- BP Readings from Last 3 Encounters:  10/25/12 146/76  08/16/12 141/60  04/11/12 137/81  Home BP monitoring-no, just got machine Compliant with medications-yes without side effects. Out of amlodipine today-will pick up.  Denies any CP, HA, SOB, blurry vision, LE edema, transient weakness, orthopnea, PND.   ROS--See HPI  Past Medical History-smoking status noted: nonsmoker, does use chewing tobacco and unwilling to quit at this time  (cutting back but not ready to quit)  Reviewed problem list.  Medications- reviewed and updated Chief complaint-noted  Objective: BP 146/76  Pulse 89  Temp 98.7 F (37.1 C) (Oral)  Ht 5\' 2"  (1.575 m)  Wt 192 lb (87.091 kg)  BMI 35.12 kg/m2 Gen: NAD, resting comfortably in chair CV: RRR no mrg Lungs: CTAB foot exam: 2+ pulses DP, some callous formation at base of 5th toe on right foot. Intact sensation gross touch.   Ext: moves all extremities, no edema  Assessment/Plan:

## 2012-10-25 NOTE — Patient Instructions (Addendum)
1. Diabetes-you made the decision to continue with your current medications and try to make lifestyle changes (continue walking, less sweets, more fruits and vegetables). You are going to increase to 52 units on your Lantus. If you get any blood sugars less than 90, reduce back to 50.  2. Blood pressure-make sure to pick up amlodipine. I am going to go through your meds and refill everything for 1 year.  3. For your cholesterol-we will check it today and I will let you know if we need to adjust things.  4. The dash diet handout should help your cholesterol and likely your diabetes.  5. You will get your flu shot today.   Health Maintenance Due  Topic Date Due  . Zostavax  06/29/2001  . Tetanus/tdap  03/02/2009  . Influenza Vaccine  06/02/2012  . Ophthalmology Exam  08/10/2012   Follow up with me in 2 months.   MOST IMPORTANTLY MAKE SURE TO GO TO YOUR EYE DOCTOR!!!!!!!!!!!!!!!!!!!!!!!!!!!!!!!!!!!!!!! !!

## 2012-10-25 NOTE — Assessment & Plan Note (Signed)
Poorly controlled. Patient noncompliant with novolog, metformin, and glipizide but did restart glipizide and metformin 1 week ago. Encouraged patient to take medicines.  Patient not willing to take novolog currently and prefers lifestyle modifications. Willing to increase lantus to 52 units. Hope for improved a1c but expect to have to open up meal time insulin discussion again next visit.

## 2012-10-27 ENCOUNTER — Encounter: Payer: Self-pay | Admitting: Family Medicine

## 2012-10-28 ENCOUNTER — Encounter: Payer: Self-pay | Admitting: Family Medicine

## 2012-12-12 ENCOUNTER — Other Ambulatory Visit: Payer: Self-pay | Admitting: Family Medicine

## 2012-12-18 ENCOUNTER — Encounter: Payer: Self-pay | Admitting: *Deleted

## 2012-12-28 ENCOUNTER — Other Ambulatory Visit: Payer: Self-pay | Admitting: Family Medicine

## 2013-02-01 ENCOUNTER — Other Ambulatory Visit: Payer: Self-pay | Admitting: Family Medicine

## 2013-02-28 ENCOUNTER — Other Ambulatory Visit: Payer: Self-pay | Admitting: Family Medicine

## 2013-03-14 ENCOUNTER — Ambulatory Visit: Payer: Medicare Other | Admitting: Family Medicine

## 2013-03-31 ENCOUNTER — Ambulatory Visit: Payer: Medicare Other | Admitting: Family Medicine

## 2013-04-03 ENCOUNTER — Ambulatory Visit (INDEPENDENT_AMBULATORY_CARE_PROVIDER_SITE_OTHER): Payer: Medicare Other | Admitting: Family Medicine

## 2013-04-03 ENCOUNTER — Encounter: Payer: Self-pay | Admitting: Family Medicine

## 2013-04-03 VITALS — BP 197/105 | HR 88 | Temp 98.6°F | Wt 185.0 lb

## 2013-04-03 DIAGNOSIS — R141 Gas pain: Secondary | ICD-10-CM

## 2013-04-03 DIAGNOSIS — I10 Essential (primary) hypertension: Secondary | ICD-10-CM

## 2013-04-03 DIAGNOSIS — R140 Abdominal distension (gaseous): Secondary | ICD-10-CM

## 2013-04-03 DIAGNOSIS — R143 Flatulence: Secondary | ICD-10-CM | POA: Diagnosis not present

## 2013-04-03 DIAGNOSIS — IMO0001 Reserved for inherently not codable concepts without codable children: Secondary | ICD-10-CM | POA: Diagnosis not present

## 2013-04-03 DIAGNOSIS — E1165 Type 2 diabetes mellitus with hyperglycemia: Secondary | ICD-10-CM

## 2013-04-03 LAB — CBC
Hemoglobin: 13.2 g/dL (ref 12.0–15.0)
MCH: 27.6 pg (ref 26.0–34.0)
MCHC: 34 g/dL (ref 30.0–36.0)
RDW: 15.3 % (ref 11.5–15.5)

## 2013-04-03 MED ORDER — AMLODIPINE BESYLATE 10 MG PO TABS: 10.0000 mg | ORAL_TABLET | Freq: Every day | ORAL | Status: DC

## 2013-04-03 MED ORDER — CLONIDINE HCL 0.2 MG PO TABS: 0.2000 mg | ORAL_TABLET | Freq: Two times a day (BID) | ORAL | Status: DC

## 2013-04-03 MED ORDER — POLYETHYLENE GLYCOL 3350 17 GM/SCOOP PO POWD: 17.0000 g | Freq: Every day | ORAL | Status: DC

## 2013-04-03 MED ORDER — HYDROCHLOROTHIAZIDE 25 MG PO TABS: 25.0000 mg | ORAL_TABLET | Freq: Every day | ORAL | Status: DC

## 2013-04-03 MED ORDER — METOPROLOL SUCCINATE ER 100 MG PO TB24: 100.0000 mg | ORAL_TABLET | Freq: Every day | ORAL | Status: DC

## 2013-04-03 NOTE — Patient Instructions (Addendum)
1. Please pick up the 4 blood pressure medications and start taking them today. We are going to check some labs today.  2. Try taking miralax everyday to have regular bowel movements. If this does not improve things, we may need to get you back for a colonoscopy or a test to see if your diabetes is affecting your bowel movements. Your hernia may also need to be evaluated by surgery.   See me in 1-2 weeks to follow up on these issues as well as your diabetes, Dr. Yong Channel

## 2013-04-03 NOTE — Assessment & Plan Note (Signed)
Poorly controlled due to noncompliance. Large financial burden on patient she states. only taking lisinopril currently. States has been out of amlodipine, clonidine, hctz, and metoprolol for 2 days. Patient to pick these up today. CMET, CBC to check for end organ damage (Cr of most interest). Patient to follow up for recheck in 1-2 weeks with me where we can talk about her DM as well.

## 2013-04-03 NOTE — Progress Notes (Signed)
[facility] Garret Reddish, MD Phone: [phone]  Subjective:   # Stomach Bloating For 1 month, experiencing bloating throughout the day. Has been told she looks pregnant. Occasional diffuse pain but less than daily and pain is mild. Straining with bowel movements and about 4-5 days apart, if takes  laxative has diarrhea then holds off on laxative then 4-5 days apart again. Gas seems to have a foul odor. Laxative is milk of magnesia. Colonoscopy in 2005 and thinks she had 1 polyp but claims they told her 10 year follow up. No melena or BRBPR. Not bloated today.   ROS-no fevers/chills/fatigue/malaise/nausea/vomiting/recent weight change (lost 7 lbs in 6 months per our records-but has been getting on treadmill some).    #Hypertension BP Readings from Last 3 Encounters:  04/03/13 197/105  10/25/12 146/76  08/16/12 141/60  Home BP monitoring-no Compliant with medications-only taking lisinopril currently. States has been out of amlodipine, clonidine, hctz, and metoprolol for 2 days. During this time she has had a mild HA as is common when out of BP meds. States no blurry vision, CP, SOB, dizziness, LE edema, transient weakness, orthopnea, PND.   ROS--See HPI  Past Medical History Patient Active Problem List   Diagnosis Date Noted  . Chronic diastolic heart failure [id number]    Priority: High  . DM (diabetes mellitus), type 2, uncontrolled 11/29/2006    Priority: High  . TOBACCO USE 04/13/2008    Priority: Medium  . HYPERLIPIDEMIA 11/29/2006    Priority: Medium  . OBESITY, BMI 30-35 11/29/2006    Priority: Medium  . HYPERTENSION, BENIGN SYSTEMIC 11/29/2006    Priority: Medium  . EDEMA-LEGS,DUE TO VENOUS OBSTRUCT. 11/29/2006    Priority: Low  . ECZEMA, ATOPIC DERMATITIS 11/29/2006    Priority: Low  . Heartburn 11/29/2006    Priority: Low  . Diabetic retinopathy associated with type 2 diabetes mellitus 08/22/2011   Reviewed problem list.  Medications-  reviewed and updated Chief complaint-noted  Objective: BP 197/105  Pulse 88  Temp(Src) 98.6 F (37 C) (Oral)  Wt 185 lb (83.915 kg)  BMI 33.83 kg/m2 Gen: NAD, resting comfortably HEENT: unable to visualize fundus CV: RRR no murmurs rubs or gallops Lungs: CTAB no crackles, wheeze, rhonchi Abdomen: soft, non tender, very mildly distended, no organomegaly  Skin: warm, dry Neuro: nonfocal neurological exam Ext: no edema  Assessment/Plan:  # Stomach Bloating Cmet, lipase, CBC. Possibly constipation related. Will trial miralax instead of milk of magnesia and have f/u in 1-2 weeks. Other possibilities include DM gastroparesis, neoplasm (no weight loss or systemic signs though). Patient is up to date on colonoscopy though with recent change and needs in 1 year, repeat colonoscopy a possibility. Could also consider abdominal series to evaluate stool burden and gaseous distension.   Needs TDAP and retinal exam at next visit

## 2013-04-04 LAB — COMPREHENSIVE METABOLIC PANEL
AST: 10 U/L (ref 0–37)
Alkaline Phosphatase: 81 U/L (ref 39–117)
Glucose, Bld: 379 mg/dL — ABNORMAL HIGH (ref 70–99)
Sodium: 138 mEq/L (ref 135–145)
Total Bilirubin: 0.3 mg/dL (ref 0.3–1.2)
Total Protein: 7.1 g/dL (ref 6.0–8.3)

## 2013-04-10 ENCOUNTER — Other Ambulatory Visit: Payer: Self-pay

## 2013-04-28 ENCOUNTER — Ambulatory Visit: Payer: Medicare Other | Admitting: Family Medicine

## 2013-05-04 ENCOUNTER — Other Ambulatory Visit: Payer: Self-pay | Admitting: Family Medicine

## 2013-05-15 ENCOUNTER — Ambulatory Visit (INDEPENDENT_AMBULATORY_CARE_PROVIDER_SITE_OTHER): Payer: Medicare Other | Admitting: Family Medicine

## 2013-05-15 ENCOUNTER — Encounter: Payer: Self-pay | Admitting: Family Medicine

## 2013-05-15 VITALS — BP 149/76 | HR 67 | Ht 62.0 in | Wt 188.0 lb

## 2013-05-15 DIAGNOSIS — N390 Urinary tract infection, site not specified: Secondary | ICD-10-CM

## 2013-05-15 DIAGNOSIS — I10 Essential (primary) hypertension: Secondary | ICD-10-CM

## 2013-05-15 DIAGNOSIS — R358 Other polyuria: Secondary | ICD-10-CM

## 2013-05-15 DIAGNOSIS — E785 Hyperlipidemia, unspecified: Secondary | ICD-10-CM

## 2013-05-15 DIAGNOSIS — R3589 Other polyuria: Secondary | ICD-10-CM

## 2013-05-15 DIAGNOSIS — E1165 Type 2 diabetes mellitus with hyperglycemia: Secondary | ICD-10-CM

## 2013-05-15 DIAGNOSIS — IMO0001 Reserved for inherently not codable concepts without codable children: Secondary | ICD-10-CM

## 2013-05-15 LAB — POCT URINALYSIS DIPSTICK
Glucose, UA: NEGATIVE
Ketones, UA: NEGATIVE
Protein, UA: 30
Urobilinogen, UA: 1

## 2013-05-15 LAB — POCT UA - MICROSCOPIC ONLY

## 2013-05-15 MED ORDER — INSULIN ASPART 100 UNIT/ML ~~LOC~~ SOLN: 10.0000 [IU] | Freq: Every day | SUBCUTANEOUS | Status: DC

## 2013-05-15 MED ORDER — TETANUS-DIPHTH-ACELL PERTUSSIS 5-2.5-18.5 LF-MCG/0.5 IM SUSP: 0.5000 mL | Freq: Once | INTRAMUSCULAR | Status: DC

## 2013-05-15 MED ORDER — CEPHALEXIN 500 MG PO CAPS: 500.0000 mg | ORAL_CAPSULE | Freq: Four times a day (QID) | ORAL | Status: DC

## 2013-05-15 NOTE — Patient Instructions (Addendum)
1. We are checking your urine, though I bet your frequent peeing is due to your diabetes.  2. You got a tetanus shot today.  3. PLEASE go see your eye doctor and have him fax [name] the notes.  4. For your insulin  * decrease your daily lantus (in the morning) to 45 units.   * Take 10 units of novolog aspart before dinner (the new medicine)  *take glipizide only in the mornign  *take 2 metformin twice a day  *keep taking your blood sugar 3x a day. Use the log book to record.   *see handout on low blood sugar. Remember this is the risk with the new medicine though I dont think it will happen with our other adjustments.  5. Great job taking your blood pressure medicines! It looks much better today  Follow up in pharmacy clinic in 2 weeks and with me in 4 weeks.   Thanks, Dr. Yong Channel  Hypoglycemia (Low Blood Sugar) Hypoglycemia is when the glucose (sugar) in your blood is too low. Hypoglycemia can happen for many reasons. It can happen to people with or without diabetes. Hypoglycemia can develop quickly and can be a medical emergency.  CAUSES  Having hypoglycemia does not mean that you will develop diabetes. Different causes include:  Missed or delayed meals or not enough carbohydrates eaten.  Medication overdose. This could be by accident or deliberate. If by accident, your medication may need to be adjusted or changed.  Exercise or increased activity without adjustments in carbohydrates or medications.  A nerve disorder that affects body functions like your heart rate, blood pressure and digestion (autonomic neuropathy).  A condition where the stomach muscles do not function properly (gastroparesis). Therefore, medications may not absorb properly.  The inability to recognize the signs of hypoglycemia (hypoglycemic unawareness).  Absorption of insulin  may be altered.  Alcohol consumption.  Pregnancy/menstrual cycles/postpartum. This may be due to hormones.  Certain kinds of tumors.  This is very rare. SYMPTOMS   Sweating.  Hunger.  Dizziness.  Blurred vision.  Drowsiness.  Weakness.  Headache.  Rapid heart beat.  Shakiness.  Nervousness. DIAGNOSIS  Diagnosis is made by monitoring blood glucose in one or all of the following ways:  Fingerstick blood glucose monitoring.  Laboratory results. TREATMENT  If you think your blood glucose is low:  Check your blood glucose, if possible. If it is less than 70 mg/dl, take one of the following:  3-4 glucose tablets.   cup juice (prefer clear like apple).   cup "regular" soda pop.  1 cup milk.  -1 tube of glucose gel.  5-6 hard candies.  Do not over treat because your blood glucose (sugar) will only go too high.  Wait 15 minutes and recheck your blood glucose. If it is still less than 70 mg/dl (or below your target range), repeat treatment.  Eat a snack if it is more than one hour until your next meal. Sometimes, your blood glucose may go so low that you are unable to treat yourself. You may need someone to help you. You may even pass out or be unable to swallow. This may require you to get an injection of glucagon, which raises the blood glucose. HOME CARE INSTRUCTIONS  Check blood glucose as recommended by your caregiver.  Take medication as prescribed by your caregiver.  Follow your meal plan. Do not skip meals. Eat on time.  If you are going to drink alcohol, drink it only with meals.  Check your  blood glucose before driving.  Check your blood glucose before and after exercise. If you exercise longer or different than usual, be sure to check blood glucose more frequently.  Always carry treatment with you. Glucose tablets are the easiest to carry.  Always wear medical alert jewelry or carry some form of identification that states that you have diabetes. This will alert people that you have diabetes. If you have hypoglycemia, they will have a better idea on what to do. SEEK MEDICAL  CARE IF:   You are having problems keeping your blood sugar at target range.  You are having frequent episodes of hypoglycemia.  You feel you might be having side effects from your medicines.  You have symptoms of an illness that is not improving after 3-4 days.  You notice a change in vision or a new problem with your vision. SEEK IMMEDIATE MEDICAL CARE IF:   You are a family member or friend of a person whose blood glucose goes below 70 mg/dl and is accompanied by:  Confusion.  A change in mental status.  The inability to swallow.  Passing out. Document Released: 09/18/2005 Document Revised: 12/11/2011 Document Reviewed: 01/15/2012 [facility] Patient Information 2014 [place], [place].

## 2013-05-15 NOTE — Progress Notes (Signed)
[name] Garret Reddish, MD Phone: [phone]  Subjective:   # Diabetes Mellitus a1c trending up to 11. Patient admits very hard for her to take so many medications. It is very frustrating to her. SHe is taking 50 of lantus each day. Knows something else needs to be done on top of metformin and lantus (admits does miss doses occasionally). ENdorses polyuria, denies nocturia. No hypoglycemic symptoms (shaky, sweaty, hungry, anxious, weak). Fasting CBGs 80-140. States post prandials are 300-400 with after dinner being the worst time as this is her largest meal.   Health Maintenance Due  Topic Date Due  . Tetanus/tdap - given handout for her to get at pharmacy 03/02/2009  . Ophthalmology Exam -patient knows she needs to follow up and was encouraged to do so today.  08/10/2012   # Polyuria States for 2 weeks has been peeing more frequently. No fevers or chills. Accompanied by foul odor. No vaginal discharge. No new sexual contacts.   # Hypertension/Hyperlipidemia BP Readings from Last 3 Encounters:  05/15/13 149/76  04/03/13 197/105  10/25/12 146/76  Home BP monitoring-no Compliant with medications-yes, picked up after last visit and BP much better today. Goal <150/90 given age and no history CVA or CAD. Claims to be taking pravastatin (cannot afford high intensity statin) Denies any CP, HA, SOB, LE edema, transient weakness, orthopnea, PND.   ROS--See HPI  Past Medical History Patient Active Problem List   Diagnosis Date Noted  . Chronic diastolic heart failure XX123456    Priority: High  . DM (diabetes mellitus), type 2, uncontrolled 11/29/2006    Priority: High  . TOBACCO USE 04/13/2008    Priority: Medium  . HYPERLIPIDEMIA 11/29/2006    Priority: Medium  . OBESITY, BMI 30-35 11/29/2006    Priority: Medium  . HYPERTENSION, BENIGN SYSTEMIC 11/29/2006    Priority: Medium  . EDEMA-LEGS,DUE TO VENOUS OBSTRUCT. 11/29/2006    Priority: Low  . ECZEMA,  ATOPIC DERMATITIS 11/29/2006    Priority: Low  . Heartburn 11/29/2006    Priority: Low  . Diabetic retinopathy associated with type 2 diabetes mellitus 08/22/2011   Reviewed problem list.  Medications- reviewed and updated Current Outpatient Prescriptions on File Prior to Visit  Medication Sig Dispense Refill  . amLODipine (NORVASC) 10 MG tablet Take 1 tablet (10 mg total) by mouth daily.  30 tablet  11  . aspirin 81 MG tablet Take 81 mg by mouth daily.        . Calcium Carbonate-Vit D-Min 600-400 MG-UNIT TABS Take by mouth daily.        . cloNIDine (CATAPRES) 0.2 MG tablet Take 1 tablet (0.2 mg total) by mouth 2 (two) times daily.  60 tablet  11  . hydrochlorothiazide (HYDRODIURIL) 25 MG tablet Take 1 tablet (25 mg total) by mouth daily.  30 tablet  11  . insulin glargine (LANTUS) 100 UNIT/ML injection Inject 45 Units into the skin daily.      Marland Kitchen lisinopril (PRINIVIL,ZESTRIL) 40 MG tablet TAKE 1 TABLET (40 MG TOTAL) BY MOUTH DAILY.  30 tablet  11  . metFORMIN (GLUCOPHAGE) 500 MG tablet TAKE 2 TABLETS BY MOUTH TWICE A DAY WITH FOOD  120 tablet  3  . metoprolol succinate (TOPROL-XL) 100 MG 24 hr tablet Take 1 tablet (100 mg total) by mouth daily. Take with or immediately following a meal.  30 tablet  11  . polyethylene glycol powder (GLYCOLAX/MIRALAX) powder Take 17 g by mouth daily. Hold for loose stools.  [place]  g  1  . pravastatin (PRAVACHOL) 80 MG tablet Take 1 tablet (80 mg total) by mouth daily.  30 tablet  5   No current facility-administered medications on file prior to visit.  Chief complaint-noted  Objective: BP 149/76  Pulse 67  Ht 5\' 2"  (1.575 m)  Wt 188 lb (85.276 kg)  BMI 34.38 kg/m2 Gen: NAD, resting comfortably in chair  CV: RRR no murmurs rubs or gallops Lungs: CTAB no crackles, wheeze, rhonchi Skin: warm, dry Neuro: grossly normal, moves all extremities MSK: no CVA tenderness  Assessment/Plan:  #UTI/polyuria 10 days keflex. Return if no improvement

## 2013-05-18 NOTE — Assessment & Plan Note (Signed)
Well controlled. Continue current medications: lisinopril, hctz, amlodipine, metoprolol, clonidine.

## 2013-05-18 NOTE — Assessment & Plan Note (Signed)
Poorly controlled on lantus and metformin and glipizide. Previously resistant to mealtime but with a1c 11 agrees to try 10 units of novolog with dinner and already has supplies available. Lantus down to 45 from 50 units. Can stop taking PM glipizide dose, continue metformin. To return in 2 weeks to pharmacy clinic for further titration (given logbook). Advised of hypoglycemia risk and given handout.

## 2013-05-18 NOTE — Assessment & Plan Note (Signed)
Well controlled. Continue current medications: pravastatin 80 mg.

## 2013-05-20 ENCOUNTER — Ambulatory Visit: Payer: Medicare Other | Admitting: Family Medicine

## 2013-06-18 ENCOUNTER — Ambulatory Visit: Payer: Medicare Other | Admitting: Family Medicine

## 2013-07-03 ENCOUNTER — Encounter: Payer: Self-pay | Admitting: Family Medicine

## 2013-07-03 ENCOUNTER — Ambulatory Visit (INDEPENDENT_AMBULATORY_CARE_PROVIDER_SITE_OTHER): Payer: Medicare Other | Admitting: Family Medicine

## 2013-07-03 VITALS — BP 129/58 | HR 72 | Temp 99.1°F | Ht 62.0 in | Wt 187.0 lb

## 2013-07-03 DIAGNOSIS — E1139 Type 2 diabetes mellitus with other diabetic ophthalmic complication: Secondary | ICD-10-CM | POA: Diagnosis not present

## 2013-07-03 DIAGNOSIS — Z23 Encounter for immunization: Secondary | ICD-10-CM

## 2013-07-03 DIAGNOSIS — I10 Essential (primary) hypertension: Secondary | ICD-10-CM | POA: Diagnosis not present

## 2013-07-03 DIAGNOSIS — IMO0001 Reserved for inherently not codable concepts without codable children: Secondary | ICD-10-CM

## 2013-07-03 DIAGNOSIS — E11319 Type 2 diabetes mellitus with unspecified diabetic retinopathy without macular edema: Secondary | ICD-10-CM

## 2013-07-03 DIAGNOSIS — IMO0002 Reserved for concepts with insufficient information to code with codable children: Secondary | ICD-10-CM

## 2013-07-03 DIAGNOSIS — E1165 Type 2 diabetes mellitus with hyperglycemia: Secondary | ICD-10-CM

## 2013-07-03 MED ORDER — TETANUS-DIPHTH-ACELL PERTUSSIS 5-2.5-18.5 LF-MCG/0.5 IM SUSP: 0.5000 mL | Freq: Once | INTRAMUSCULAR | Status: DC

## 2013-07-03 MED ORDER — INSULIN ASPART 100 UNIT/ML ~~LOC~~ SOLN: 10.0000 [IU] | Freq: Every day | SUBCUTANEOUS | Status: DC

## 2013-07-03 NOTE — Assessment & Plan Note (Signed)
Reinforced need to go to ophthalmologist.  Did not think retinal scanner would be beneficial as already has known retinopathy.

## 2013-07-03 NOTE — Assessment & Plan Note (Signed)
Poorly controlled but much improved. Please see plan from 05/18/13 as this was gone over once again -decrease lantus to 45, at predinner 10 aspart, metformin same dose, glipizde in AM only. 2 week f/u pharm clinic and 1 month f/u with me. Enouraged to use log book given last visit.

## 2013-07-03 NOTE — Progress Notes (Signed)
[facility] Garret Reddish, MD Phone: [phone]  Subjective:   # Diabetes Mellitus/diabetic retinopathy Patient adamant that she can control diabetes if she eats well. A1c down to 9.5 from 11 today. Patient did not pick up aspart-states they said "we dont have that insulin". I called pharmacy and for some reason does not look like received although i could verified sent. Resubmitted and patient agreeable to try again. Taking 50 of lantus still instead of decrease to 45 as previously discussed. Still taking metformin and glipizide.   Patient admits "I didn't pick up the insulin, I didn't see Dr. Valentina Lucks, and I didn't go to eye doctor". She agrees to do these after this visit.   ROS- Patient states polyuria better after UTI treatment (denies at present), denies nocturia. No hypoglycemic symptoms (shaky, sweaty, hungry, anxious, weak). Fasting CBGs 80-150. 80 if eats well the night before at bedtime.  States post prandials are 250-300 with after dinner. DInner is largest meal.    Health Maintenance Due  Topic Date Due  . Tetanus/tdap - given another rx for pharmacy 03/02/2009  . Ophthalmology Exam -patient knows she needs to follow up and was encouraged to do so today. Reinforced that patient could go blind.  08/10/2012    # Hypertension BP Readings from Last 3 Encounters:  07/03/13 129/58  05/15/13 149/76  04/03/13 197/105  Home BP monitoring-no Compliant with medications-yes see below.  ROS- Denies any CP, SOB.    Past Medical History Patient Active Problem List   Diagnosis Date Noted  . Chronic diastolic heart failure XX123456    Priority: High  . DM (diabetes mellitus), type 2, uncontrolled 11/29/2006    Priority: High  . TOBACCO USE 04/13/2008    Priority: Medium  . HYPERLIPIDEMIA 11/29/2006    Priority: Medium  . OBESITY, BMI 30-35 11/29/2006    Priority: Medium  . HYPERTENSION, BENIGN SYSTEMIC 11/29/2006    Priority: Medium  . ECZEMA, ATOPIC  DERMATITIS 11/29/2006    Priority: Low  . Heartburn 11/29/2006    Priority: Low  . Diabetic retinopathy associated with type 2 diabetes mellitus 08/22/2011   Reviewed problem list.  Medications- reviewed and updated Current Outpatient Prescriptions on File Prior to Visit  Medication Sig Dispense Refill  . amLODipine (NORVASC) 10 MG tablet Take 1 tablet (10 mg total) by mouth daily.  30 tablet  11  . aspirin 81 MG tablet Take 81 mg by mouth daily.        . Calcium Carbonate-Vit D-Min 600-400 MG-UNIT TABS Take by mouth daily.        . cloNIDine (CATAPRES) 0.2 MG tablet Take 1 tablet (0.2 mg total) by mouth 2 (two) times daily.  60 tablet  11  . glipiZIDE (GLUCOTROL) 10 MG tablet Take 10 mg by mouth daily before breakfast.      . hydrochlorothiazide (HYDRODIURIL) 25 MG tablet Take 1 tablet (25 mg total) by mouth daily.  30 tablet  11  . insulin glargine (LANTUS) 100 UNIT/ML injection Inject 45 Units into the skin daily.       Marland Kitchen lisinopril (PRINIVIL,ZESTRIL) 40 MG tablet TAKE 1 TABLET (40 MG TOTAL) BY MOUTH DAILY.  30 tablet  11  . metFORMIN (GLUCOPHAGE) 500 MG tablet TAKE 2 TABLETS BY MOUTH TWICE A DAY WITH FOOD  120 tablet  3  . metoprolol succinate (TOPROL-XL) 100 MG 24 hr tablet Take 1 tablet (100 mg total) by mouth daily. Take with or immediately following a meal.  30 tablet  11  . polyethylene glycol powder (GLYCOLAX/MIRALAX) powder Take 17 g by mouth daily. Hold for loose stools.  3350 g  1  . pravastatin (PRAVACHOL) 80 MG tablet Take 1 tablet (80 mg total) by mouth daily.  30 tablet  5   No current facility-administered medications on file prior to visit.  Chief complaint-noted  Objective: BP 129/58  Pulse 72  Temp(Src) 99.1 F (37.3 C) (Oral)  Ht 5\' 2"  (1.575 m)  Wt 187 lb (84.823 kg)  BMI 34.19 kg/m2 Gen: NAD, resting comfortably in chair  CV: RRR no murmurs rubs or gallops Lungs: CTAB no crackles, wheeze, rhonchi Ext: no edema  Assessment/Plan:

## 2013-07-03 NOTE — Assessment & Plan Note (Signed)
Well controlled. Given history of difficult to control-would tolerate up to 150 SBP but also happy with SBP 129 as long as not orthostatic.

## 2013-07-03 NOTE — Patient Instructions (Addendum)
1. Please go get your   3. PLEASE go see your eye doctor and have him fax [name] the notes. PLEASE do this. You could go blind from your diabetes if we do not control it and have the eye doctor follow you.  4. For your insulin  * decrease your daily lantus (in the morning) to 45 units.   * Take 10 units of novolog aspart before dinner (the new medicine)  *take glipizide only in the morning  *take 2 metformin twice a day  *keep taking your blood sugar 3x a day. Use the log book to record.   *see handout on low blood sugar. Remember this is the risk with the new medicine though I dont think it will happen with our other adjustments.  5. Blood pressure still looks great.   Follow up in pharmacy clinic in 2 weeks and with me in 4 weeks. You will get your a1c at your next visit.   Thanks, Dr. Yong Channel  Health Maintenance Due  Topic Date Due  . Tetanus/tdap -please go to pharmacy to get this  03/02/2009  . Ophthalmology Exam -PLEASE go see them 08/10/2012  . Influenza Vaccine -today 05/02/2013  . Hemoglobin A1c -next visit 07/04/2013     Hypoglycemia (Low Blood Sugar) Hypoglycemia is when the glucose (sugar) in your blood is too low. Hypoglycemia can happen for many reasons. It can happen to people with or without diabetes. Hypoglycemia can develop quickly and can be a medical emergency.  CAUSES  Having hypoglycemia does not mean that you will develop diabetes. Different causes include:  Missed or delayed meals or not enough carbohydrates eaten.  Medication overdose. This could be by accident or deliberate. If by accident, your medication may need to be adjusted or changed.  Exercise or increased activity without adjustments in carbohydrates or medications.  A nerve disorder that affects body functions like your heart rate, blood pressure and digestion (autonomic neuropathy).  A condition where the stomach muscles do not function properly (gastroparesis). Therefore, medications may not  absorb properly.  The inability to recognize the signs of hypoglycemia (hypoglycemic unawareness).  Absorption of insulin  may be altered.  Alcohol consumption.  Pregnancy/menstrual cycles/postpartum. This may be due to hormones.  Certain kinds of tumors. This is very rare. SYMPTOMS   Sweating.  Hunger.  Dizziness.  Blurred vision.  Drowsiness.  Weakness.  Headache.  Rapid heart beat.  Shakiness.  Nervousness. DIAGNOSIS  Diagnosis is made by monitoring blood glucose in one or all of the following ways:  Fingerstick blood glucose monitoring.  Laboratory results. TREATMENT  If you think your blood glucose is low:  Check your blood glucose, if possible. If it is less than 70 mg/dl, take one of the following:  3-4 glucose tablets.   cup juice (prefer clear like apple).   cup "regular" soda pop.  1 cup milk.  -1 tube of glucose gel.  5-6 hard candies.  Do not over treat because your blood glucose (sugar) will only go too high.  Wait 15 minutes and recheck your blood glucose. If it is still less than 70 mg/dl (or below your target range), repeat treatment.  Eat a snack if it is more than one hour until your next meal. Sometimes, your blood glucose may go so low that you are unable to treat yourself. You may need someone to help you. You may even pass out or be unable to swallow. This may require you to get an injection of glucagon, which  raises the blood glucose. HOME CARE INSTRUCTIONS  Check blood glucose as recommended by your caregiver.  Take medication as prescribed by your caregiver.  Follow your meal plan. Do not skip meals. Eat on time.  If you are going to drink alcohol, drink it only with meals.  Check your blood glucose before driving.  Check your blood glucose before and after exercise. If you exercise longer or different than usual, be sure to check blood glucose more frequently.  Always carry treatment with you. Glucose tablets are  the easiest to carry.  Always wear medical alert jewelry or carry some form of identification that states that you have diabetes. This will alert people that you have diabetes. If you have hypoglycemia, they will have a better idea on what to do. SEEK MEDICAL CARE IF:   You are having problems keeping your blood sugar at target range.  You are having frequent episodes of hypoglycemia.  You feel you might be having side effects from your medicines.  You have symptoms of an illness that is not improving after 3-4 days.  You notice a change in vision or a new problem with your vision. SEEK IMMEDIATE MEDICAL CARE IF:   You are a family member or friend of a person whose blood glucose goes below 70 mg/dl and is accompanied by:  Confusion.  A change in mental status.  The inability to swallow.  Passing out. Document Released: 09/18/2005 Document Revised: 12/11/2011 Document Reviewed: 01/15/2012 [facility] Patient Information 2014 [place], [place].

## 2013-07-17 ENCOUNTER — Ambulatory Visit: Payer: Medicare Other | Admitting: Pharmacist

## 2013-07-30 ENCOUNTER — Ambulatory Visit: Payer: Medicare Other | Admitting: Family Medicine

## 2013-08-11 ENCOUNTER — Ambulatory Visit: Payer: Medicare Other | Admitting: Pharmacist

## 2013-09-04 ENCOUNTER — Ambulatory Visit (INDEPENDENT_AMBULATORY_CARE_PROVIDER_SITE_OTHER): Payer: Medicare Other | Admitting: Pharmacist

## 2013-09-04 ENCOUNTER — Encounter: Payer: Self-pay | Admitting: Pharmacist

## 2013-09-04 DIAGNOSIS — IMO0002 Reserved for concepts with insufficient information to code with codable children: Secondary | ICD-10-CM

## 2013-09-04 DIAGNOSIS — E1165 Type 2 diabetes mellitus with hyperglycemia: Secondary | ICD-10-CM

## 2013-09-04 DIAGNOSIS — K5900 Constipation, unspecified: Secondary | ICD-10-CM

## 2013-09-04 MED ORDER — INSULIN GLARGINE 100 UNIT/ML ~~LOC~~ SOLN: 30.0000 [IU] | Freq: Every day | SUBCUTANEOUS | Status: DC

## 2013-09-04 MED ORDER — SENNOSIDES-DOCUSATE SODIUM 8.6-50 MG PO TABS: 1.0000 | ORAL_TABLET | Freq: Every day | ORAL | Status: DC

## 2013-09-04 MED ORDER — INSULIN ASPART 100 UNIT/ML ~~LOC~~ SOLN: 5.0000 [IU] | Freq: Three times a day (TID) | SUBCUTANEOUS | Status: DC

## 2013-09-04 NOTE — Progress Notes (Signed)
Patient ID: [name], female   DOB: [birth year], 72 y.o.   MRN: [id number] Reviewed: Agree with Dr. Graylin Shiver documentation and management.

## 2013-09-04 NOTE — Assessment & Plan Note (Signed)
Diabetes diagnosed in her 86s currently out of control due to insulin deficiency secondary to nonadherence with prescribed regimen most likely due to cost. Reports hypoglycemic events and is able to verbalize appropriate hypoglycemia management plan.  Reports NON-adherence with Novolog - rapid acting insulin AND adherence will all other medication. Control is suboptimal due to suboptimal regimen in a patient requesting lower cost regimen.    Decreased dose of basal insulin Lantus (insulin glargine) to 30 units QAM. Started rapid insulin Novolog (insulin aspart) at a dose of 02/03/09 with meals. Written patient instructions provided.  Follow up in Pharmacist Clinic Visit in mid-late January.  Total time in face to face counseling 45 minutes.

## 2013-09-04 NOTE — Progress Notes (Signed)
S:    Patient arrives in fair spirits - she reports that her husband had multiple strokes recently and this has been stressful to her.  She presents for diabetes management assistance.   Patient reports having history of Diabetes since her 72s.  Patient has taken Insulin since the 1990s.   Has Never taken Novolog - due to cost.   Desired to discuss constipation with Dr. Yong Channel.  She requested to talk to him if possible.   O:  . Lab Results  Component Value Date   HGBA1C 9.5 07/03/2013     home fasting CBG readings of < 80 at times but average per self report of ~ 150 2 hour post-prandial/random CBG readings of > 250 routinely.    A/P: Diabetes diagnosed in her 22s currently out of control due to insulin deficiency secondary to nonadherence with prescribed regimen most likely due to cost. Reports hypoglycemic events and is able to verbalize appropriate hypoglycemia management plan.  Reports NON-adherence with Novolog - rapid acting insulin AND adherence will all other medication. Control is suboptimal due to suboptimal regimen in a patient requesting lower cost regimen.    Decreased dose of basal insulin Lantus (insulin glargine) to 30 units QAM. Started rapid insulin Novolog (insulin aspart) at a dose of 02/03/09 with meals. Written patient instructions provided.  Stopped Glipizide.  Follow up in Pharmacist Clinic Visit in mid-late January.  Total time in face to face counseling 45 minutes.

## 2013-09-04 NOTE — Patient Instructions (Addendum)
From Dr. Yong Channel: Take the new medicine at night to see if that helps you with your bowel movements.  We are going to send you to the stomach doctor since it has been nearly 10 years since your colonoscopy and since your constipation is new as well as your continued bloating  From Dr. Valentina Lucks:   Decrease Lantus to 30 units in the morning.   Start Novolog 5 units prior to breakfast 5 units prior to lunch and 10 units prior to evening meal.   Continue checking blood sugar up to 3-4 times daily.   STOP Glipizide  Next visit in January with Dr. Valentina Lucks

## 2013-09-04 NOTE — Progress Notes (Signed)
Per Dr. Graylin Shiver request, spoke with patient about ongoing bloating and constipation. Patient states since she saw me in July, her constipation and bloating got slightly better with miralax and has worsened since then. She has a bowel movement at least every other day but often strains or only has small amounts of stool. This is new for her in the last 6 months. She feels bloating pretty regularly and has been told she looks pregnant.   I did not examine patient today but based on history I have done the following -added senokot-s nightly to help with bowel movements -due to change in bowel habits, continued bloating-referred to GI for evaluation and likely colonoscopy (9 years since she had last colonoscopy and she reports she had benign polyp)

## 2013-09-10 ENCOUNTER — Encounter: Payer: Self-pay | Admitting: Internal Medicine

## 2013-10-10 ENCOUNTER — Encounter: Payer: Self-pay | Admitting: Internal Medicine

## 2013-10-13 ENCOUNTER — Ambulatory Visit (INDEPENDENT_AMBULATORY_CARE_PROVIDER_SITE_OTHER): Payer: Medicare Other | Admitting: Internal Medicine

## 2013-10-13 ENCOUNTER — Encounter: Payer: Self-pay | Admitting: Internal Medicine

## 2013-10-13 VITALS — BP 152/70 | HR 68 | Ht 61.0 in | Wt 189.0 lb

## 2013-10-13 DIAGNOSIS — K429 Umbilical hernia without obstruction or gangrene: Secondary | ICD-10-CM | POA: Diagnosis not present

## 2013-10-13 DIAGNOSIS — K5900 Constipation, unspecified: Secondary | ICD-10-CM | POA: Diagnosis not present

## 2013-10-13 DIAGNOSIS — Z1211 Encounter for screening for malignant neoplasm of colon: Secondary | ICD-10-CM | POA: Diagnosis not present

## 2013-10-13 NOTE — Progress Notes (Signed)
Patient ID: Wendy [name], female   DOB: 06-18-1941, 73 y.o.   MRN: [id number] HPI: Wendy [name] is a 73 year old female with a past medical history of hypertension, hyperlipidemia, diabetes who is seen in consultation at the request of Dr. Yong [name] to evaluate constipation. She is here alone today. She reports that over the last several months to a year or more developing constipation. She reports she was having a bowel movement every 5-6 days. This was often hard stool and required straining to have a bowel movement. With the straining she reports her umbilical hernia became sore and she also felt bloating. She initially tried MiraLax 17 g daily but did not have a good response to this medication. She reports her bowel movements continue to be inconsistent and she did not fill completely evacuated. She was recently switched to Senokot with docusate which she is taking once daily. This has significantly improved her constipation and she is now having a bowel movement every one to 2 days. She reports her stools or passing easily and are without blood or melena. She is no longer having to strain with stool. She denies abdominal pain. No further pain at her umbilicus. She reports a good appetite with no nausea or vomiting. No heartburn, but she does report a history of heartburn having been on Prilosec. This has not been a big problem for her lately. No dysphagia or odynophagia. No early satiety.  She recalls a previous colonoscopy performed about 10 years ago. She had 2 polyps removed at this time which she states were benign and she was told they were not precancerous (therefore presumed hyperplastic).  Past Medical History  Diagnosis Date  . BREAST MASS, LEFT 04/13/2008    Per note 04/13/2008: U/S guided core needle biopsy of left breast on 02/21/08. Benign per pathologist read but was referred to Dr. Brantley [name] for surgical excision and patient reports this was excised.    . FRACTURE, TOE, RIGHT 10/08/2007  . SYSTOLIC  MURMUR 81/44/8185  . Heartburn 11/29/2006    Qualifier: Diagnosis of  By: Herma Ard    . ECZEMA, ATOPIC DERMATITIS 11/29/2006    Qualifier: Diagnosis of  By: Herma Ard    . Colon polyps   . DM (diabetes mellitus)   . HLD (hyperlipidemia)   . HTN (hypertension)     Past Surgical History  Procedure Laterality Date  . Breast lumpectomy Left     Current Outpatient Prescriptions  Medication Sig Dispense Refill  . amLODipine (NORVASC) 10 MG tablet Take 1 tablet (10 mg total) by mouth daily.  30 tablet  11  . aspirin 81 MG tablet Take 81 mg by mouth daily.        . Calcium Carbonate-Vit D-Min 600-400 MG-UNIT TABS Take by mouth daily.        . cloNIDine (CATAPRES) 0.2 MG tablet Take 1 tablet (0.2 mg total) by mouth 2 (two) times daily.  60 tablet  11  . hydrochlorothiazide (HYDRODIURIL) 25 MG tablet Take 1 tablet (25 mg total) by mouth daily.  30 tablet  11  . insulin aspart (NOVOLOG) 100 UNIT/ML injection Inject 5-10 Units into the skin 3 (three) times daily with meals. 5 units prior to breakfast and lunch, 10 units prior to evening meal  2 vial  0  . insulin glargine (LANTUS) 100 UNIT/ML injection Inject 0.3 mLs (30 Units total) into the skin daily.  20 mL  0  . lisinopril (PRINIVIL,ZESTRIL) 40 MG tablet TAKE 1 TABLET (40 MG TOTAL) BY  MOUTH DAILY.  30 tablet  11  . metFORMIN (GLUCOPHAGE) 500 MG tablet TAKE 2 TABLETS BY MOUTH TWICE A DAY WITH FOOD  120 tablet  3  . metoprolol succinate (TOPROL-XL) 100 MG 24 hr tablet Take 1 tablet (100 mg total) by mouth daily. Take with or immediately following a meal.  30 tablet  11  . pravastatin (PRAVACHOL) 80 MG tablet Take 1 tablet (80 mg total) by mouth daily.  30 tablet  5  . senna-docusate (SENOKOT-S) 8.6-50 MG per tablet Take 1 tablet by mouth at bedtime.  30 tablet  3   No current facility-administered medications for this visit.    No Known Allergies  Family History  Problem Relation Age of Onset  . Diabetes Sister     x 4   . Diabetes Brother   . Diabetes Paternal Grandmother   . Diabetes Paternal Aunt   . Heart disease Brother   . Heart disease Sister   . Emphysema Brother     History  Substance Use Topics  . Smoking status: Never Smoker   . Smokeless tobacco: Current User    Types: Snuff  . Alcohol Use: No    ROS: As per history of present illness, otherwise negative  BP 152/70  Pulse 68  Ht 5' 1" (1.549 m)  Wt 189 lb (85.73 kg)  BMI 35.73 kg/m2 Constitutional: Well-developed and well-nourished. No distress. HEENT: Normocephalic and atraumatic. Conjunctivae are normal.  No scleral icterus. Cardiovascular: Normal rate, regular rhythm and intact distal pulses. 2/6 SEM Pulmonary/chest: Effort normal and breath sounds normal. No wheezing, rales or rhonchi. Abdominal: Soft, nontender, nondistended. Bowel sounds active throughout. Small nontender umbilical hernia and diastasis recti Extremities: no clubbing, cyanosis, or edema Neurological: Alert and oriented to person place and time. Skin: Skin is warm and dry. No rashes noted. Psychiatric: Normal mood and affect. Behavior is normal.  RELEVANT LABS AND IMAGING: CBC    Component Value Date/Time   WBC 7.9 04/03/2013 1228   RBC 4.79 04/03/2013 1228   HGB 13.2 04/03/2013 1228   HCT 38.8 04/03/2013 1228   PLT 334 04/03/2013 1228   MCV 81.0 04/03/2013 1228   MCH 27.6 04/03/2013 1228   MCHC 34.0 04/03/2013 1228   RDW 15.3 04/03/2013 1228    CMP     Component Value Date/Time   NA 138 04/03/2013 1228   K 4.0 04/03/2013 1228   CL 99 04/03/2013 1228   CO2 28 04/03/2013 1228   GLUCOSE 379* 04/03/2013 1228   BUN 12 04/03/2013 1228   CREATININE 1.01 04/03/2013 1228   CREATININE 0.95 09/03/2009 2039   CALCIUM 9.7 04/03/2013 1228   PROT 7.1 04/03/2013 1228   ALBUMIN 4.1 04/03/2013 1228   AST 10 04/03/2013 1228   ALT 8 04/03/2013 1228   ALKPHOS 81 04/03/2013 1228   BILITOT 0.3 04/03/2013 1228   GFRNONAA 55* 05/04/2008 1200   GFRAA  Value: >60        The eGFR has been calculated using  the MDRD equation. This calculation has not been validated in all clinical 05/04/2008 1200   REPORT OF SURGICAL PATHOLOGY   Case #: [id number]  Patient Name: Wendy [name]  PID: [id number]  Pathologist: Wendy Baldy, MD  DOB/Age [birth year] (Age: 54) Gender: F  Date Taken: 08/12/2004  Date Received: 08/12/2004   FINAL DIAGNOSIS   **MICROSCOPIC EXAMINATION AND DIAGNOSIS**   RECTUM, POLYP(S): HYPERPLASTIC POLYP(S). NO ADENOMATOUS CHANGE  OR MALIGNANCY IDENTIFIED.   gdt  Date Reported: 08/15/2004  Wendy Baldy, MD  ** Electronically Signed Out By BNS**   Clinical information  (to)   specimen(s) obtained  Rectum, polyp(s)   Gross Description  Received in formalin are tan, soft tissue fragments that are  submitted in toto. Number: four  Size: 0.2 and 0.4 cm, toto one cassette (BM:caf 08/12/04)  ASSESSMENT/PLAN: 73 year old female with a past medical history of hypertension, hyperlipidemia, diabetes who is seen in consultation at the request of Dr. Yong [name] to evaluate constipation.   1.  Constipation -- her constipation has significantly improved with initiation of senna plus Docusate. Given her good response, no further titration or additional laxatives as felt necessary. She will continue 1 tablet Senokot-Docusate daily. I asked that she notify me if this is no longer effective for her.  She voices understanding  2.  CRC screening/history of hyperplastic polyp -- she had a colonoscopy in 2005, the pathology records were reviewed, see above. She is average risk with no history of adenomatous polyp or family history of colon cancer.  She does not want colonoscopy unless absolutely necessary. With this in mind we discussed colorectal cancer screening with Cologuard.  We discussed how this is a new colon cancer screening test that looks for DNA changes associated with polyps and colon cancers along with occult blood.  She also understands that if this test were positive, the recommendation  would be for colonoscopy. After thorough discussion of the risks and benefits of each test, she would like to proceed with Cologuard 1st and colonoscopy if this test is positive.  This is reasonable, and the test was ordered today.  If it is negative, I recommend repeating the test every 3 years until approximately age 52 (based on her health)  3.  Umbilical hernia -- asymptomatic and she has not itched in surgery. This can be followed and is felt to be low risk.

## 2013-10-13 NOTE — Patient Instructions (Addendum)
You have been given a separate informational sheet regarding your tobacco use, the importance of quitting and local resources to help you quit.  You have signed a form to have a cologuard colon cancer screening done.  [facility] will contact you then send you out a kit.   Continue taking Senna 1 tab daily

## 2013-10-14 ENCOUNTER — Ambulatory Visit (INDEPENDENT_AMBULATORY_CARE_PROVIDER_SITE_OTHER): Payer: Medicare Other | Admitting: Family Medicine

## 2013-10-14 ENCOUNTER — Encounter: Payer: Self-pay | Admitting: Family Medicine

## 2013-10-14 VITALS — BP 146/73 | HR 65 | Temp 98.4°F | Ht 61.0 in | Wt 189.0 lb

## 2013-10-14 DIAGNOSIS — IMO0002 Reserved for concepts with insufficient information to code with codable children: Secondary | ICD-10-CM

## 2013-10-14 DIAGNOSIS — IMO0001 Reserved for inherently not codable concepts without codable children: Secondary | ICD-10-CM

## 2013-10-14 DIAGNOSIS — E1165 Type 2 diabetes mellitus with hyperglycemia: Secondary | ICD-10-CM

## 2013-10-14 LAB — POCT GLYCOSYLATED HEMOGLOBIN (HGB A1C): Hemoglobin A1C: 9.1

## 2013-10-14 MED ORDER — TETANUS-DIPHTH-ACELL PERTUSSIS 5-2.5-18.5 LF-MCG/0.5 IM SUSP: 0.5000 mL | Freq: Once | INTRAMUSCULAR | Status: DC

## 2013-10-14 NOTE — Progress Notes (Signed)
Wendy Reddish, MD Phone: [phone]  Subjective:  Chief complaint-noted  # DIABETES Type II Medications taking and tolerating-yes, metformin and Lantus 30 units in AM. Has taken 5 units novolog in morning but missed several times at lunch and is taking 10 units at dinner. Biggest meal of day is dinner.  Blood Sugars per patient- fasting-lowest in the morning was 70 (hypoglycemia) x1 and 80 x 1 when didn't eat dinner.  Typically 120-140.  Before lunch->200 everyday. 1-2x a week it is 120-150 if eats light breakfast.  Before supper-usually 300. 189 was lowest at this time.  Before bedtime-300s.   Health Maintenance Due  Topic Date Due  . Ophthalmology Exam  10/20/13 scheduled 08/10/2012  . Foot Exam -today 08/16/2013    On Aspirin-yes On statin-yes Daily foot monitoring-yes  ROS- Endorses some Polyuria and Polydipsia but improving. Denies  Hypoglycemia symptoms (shaky, sweaty, hungry, weak anxious, tremor, palpitations, confusion, behavior change) with exception of 2 mornings with CBG 70 and 80.   Hemoglobin a1c:  Lab Results  Component Value Date   HGBA1C 9.1 10/14/2013   HGBA1C 9.5 07/03/2013   HGBA1C 11.0 04/03/2013   Past Medical History Patient Active Problem List   Diagnosis Date Noted  . Diabetic retinopathy associated with type 2 diabetes mellitus 08/22/2011    Priority: High  . Chronic diastolic heart failure [id number]    Priority: High  . DM (diabetes mellitus), type 2, uncontrolled 11/29/2006    Priority: High  . TOBACCO USE 04/13/2008    Priority: Medium  . HYPERLIPIDEMIA 11/29/2006    Priority: Medium  . OBESITY, BMI 30-35 11/29/2006    Priority: Medium  . HYPERTENSION, BENIGN SYSTEMIC 11/29/2006    Priority: Medium    Medications- reviewed and updated Current Outpatient Prescriptions  Medication Sig Dispense Refill  . amLODipine (NORVASC) 10 MG tablet Take 1 tablet (10 mg total) by mouth daily.  30 tablet  11  . aspirin 81 MG tablet Take 81 mg by  mouth daily.        . Calcium Carbonate-Vit D-Min 600-400 MG-UNIT TABS Take by mouth daily.        . cloNIDine (CATAPRES) 0.2 MG tablet Take 1 tablet (0.2 mg total) by mouth 2 (two) times daily.  60 tablet  11  . hydrochlorothiazide (HYDRODIURIL) 25 MG tablet Take 1 tablet (25 mg total) by mouth daily.  30 tablet  11  . insulin aspart (NOVOLOG) 100 UNIT/ML injection Inject 5-10 Units into the skin 3 (three) times daily with meals. 5 units prior to breakfast and lunch, 10 units prior to evening meal  2 vial  0  . insulin glargine (LANTUS) 100 UNIT/ML injection Inject 0.3 mLs (30 Units total) into the skin daily.  20 mL  0  . lisinopril (PRINIVIL,ZESTRIL) 40 MG tablet TAKE 1 TABLET (40 MG TOTAL) BY MOUTH DAILY.  30 tablet  11  . metFORMIN (GLUCOPHAGE) 500 MG tablet TAKE 2 TABLETS BY MOUTH TWICE A DAY WITH FOOD  120 tablet  3  . metoprolol succinate (TOPROL-XL) 100 MG 24 hr tablet Take 1 tablet (100 mg total) by mouth daily. Take with or immediately following a meal.  30 tablet  11  . pravastatin (PRAVACHOL) 80 MG tablet Take 1 tablet (80 mg total) by mouth daily.  30 tablet  5  . senna-docusate (SENOKOT-S) 8.6-50 MG per tablet Take 1 tablet by mouth at bedtime.  30 tablet  3  . Tdap (BOOSTRIX) 5-2.5-18.5 LF-MCG/0.5 injection Inject 0.5 mLs into the muscle once.  0.5 mL  0   No current facility-administered medications for this visit.    Objective: BP 146/73  Pulse 65  Temp(Src) 98.4 F (36.9 C) (Oral)  Ht 5\' 1"  (1.549 m)  Wt 189 lb (85.73 kg)  BMI 35.73 kg/m2 Gen: NAD, resting comfortably in chair CV: RRR no murmurs rubs or gallops Lungs: CTAB no crackles, wheeze, rhonchi Dm foot exam: normal, good pulses and intact monofilament exam  Assessment/Plan:  DM (diabetes mellitus), type 2, uncontrolled Poorly controlled but improving. Missed some dinners and had AM sugar 70 or 80-advised to eat 3 meals a day. Patient states life very difficult with lunch dose of insulin. I believe she needs  this but compliance is key with this patient so want to work with her schedule. Have stopped lunch insulin and increased morning from 5 to 8 units and dinner from 10-14 units. Given lunch >200 and bedtime in 300s, I am hopeful patient can tolerate this increase. Patient will call in a week or with hypoglycemia then see either me or Dr. Valentina Lucks in a month. I will contact Dr. Valentina Lucks as patient was inquiring about samples.     Orders Placed This Encounter  Procedures  . POCT glycosylated hemoglobin (Hb A1C)    Meds ordered this encounter  Medications  . Tdap (BOOSTRIX) 5-2.5-18.5 LF-MCG/0.5 injection    Sig: Inject 0.5 mLs into the muscle once.    Dispense:  0.5 mL    Refill:  0   Health Maintenance Due  Topic Date Due  . Tetanus/tdap -gave handout and rx 03/02/2009  . Mammogram gave handout 05/05/2010  . Ophthalmology Exam 10/20/13 scheduled 08/10/2012

## 2013-10-14 NOTE — Patient Instructions (Signed)
Diabetes  a1c improved slightly to 9.1 (goal less than 7)  Keep taking Lantus 30 units in the morning  Stop taking at lunch per your preference  Start taking 8 units before breakfast and 14 units before dinner as long as blood sugar is above 110.   Call me in a week to update me on your blood sugars. Also call me if you have any blood sugars less than 75 over the next week.   See either me or Dr. Valentina Lucks in 1 month.   Go get your tetanus shot and call for your mammogram.   Thanks,  Dr. Rock Nephew to see you again today!

## 2013-10-15 NOTE — Assessment & Plan Note (Signed)
Poorly controlled but improving. Missed some dinners and had AM sugar 70 or 80-advised to eat 3 meals a day. Patient states life very difficult with lunch dose of insulin. I believe she needs this but compliance is key with this patient so want to work with her schedule. Have stopped lunch insulin and increased morning from 5 to 8 units and dinner from 10-14 units. Given lunch >200 and bedtime in 300s, I am hopeful patient can tolerate this increase. Patient will call in a week or with hypoglycemia then see either me or Dr. Valentina Lucks in a month. I will contact Dr. Valentina Lucks as patient was inquiring about samples.

## 2013-10-20 DIAGNOSIS — E119 Type 2 diabetes mellitus without complications: Secondary | ICD-10-CM | POA: Diagnosis not present

## 2013-10-20 DIAGNOSIS — H2510 Age-related nuclear cataract, unspecified eye: Secondary | ICD-10-CM | POA: Diagnosis not present

## 2013-10-20 DIAGNOSIS — Z794 Long term (current) use of insulin: Secondary | ICD-10-CM | POA: Diagnosis not present

## 2013-10-20 DIAGNOSIS — H26019 Infantile and juvenile cortical, lamellar, or zonular cataract, unspecified eye: Secondary | ICD-10-CM | POA: Diagnosis not present

## 2013-10-27 ENCOUNTER — Other Ambulatory Visit: Payer: Self-pay | Admitting: Family Medicine

## 2013-11-12 DIAGNOSIS — H26019 Infantile and juvenile cortical, lamellar, or zonular cataract, unspecified eye: Secondary | ICD-10-CM | POA: Diagnosis not present

## 2013-11-12 DIAGNOSIS — H2510 Age-related nuclear cataract, unspecified eye: Secondary | ICD-10-CM | POA: Diagnosis not present

## 2013-11-27 ENCOUNTER — Other Ambulatory Visit: Payer: Self-pay | Admitting: Family Medicine

## 2013-11-27 ENCOUNTER — Ambulatory Visit: Payer: Medicare Other | Admitting: Family Medicine

## 2013-12-01 ENCOUNTER — Ambulatory Visit (INDEPENDENT_AMBULATORY_CARE_PROVIDER_SITE_OTHER): Payer: Medicare Other | Admitting: Family Medicine

## 2013-12-01 ENCOUNTER — Encounter: Payer: Self-pay | Admitting: Family Medicine

## 2013-12-01 VITALS — BP 149/62 | HR 74 | Temp 98.5°F | Ht 61.0 in | Wt 188.0 lb

## 2013-12-01 DIAGNOSIS — J302 Other seasonal allergic rhinitis: Secondary | ICD-10-CM | POA: Insufficient documentation

## 2013-12-01 DIAGNOSIS — E1165 Type 2 diabetes mellitus with hyperglycemia: Secondary | ICD-10-CM

## 2013-12-01 DIAGNOSIS — IMO0001 Reserved for inherently not codable concepts without codable children: Secondary | ICD-10-CM | POA: Diagnosis not present

## 2013-12-01 DIAGNOSIS — IMO0002 Reserved for concepts with insufficient information to code with codable children: Secondary | ICD-10-CM

## 2013-12-01 DIAGNOSIS — I5032 Chronic diastolic (congestive) heart failure: Secondary | ICD-10-CM

## 2013-12-01 DIAGNOSIS — J309 Allergic rhinitis, unspecified: Secondary | ICD-10-CM | POA: Diagnosis not present

## 2013-12-01 MED ORDER — CETIRIZINE HCL 10 MG PO TABS: 10.0000 mg | ORAL_TABLET | Freq: Every day | ORAL | Status: DC

## 2013-12-01 NOTE — Patient Instructions (Signed)
Diabetes  Increase to 10 units of novolog before breakfast and dinner  Call me if any blood sugars below 75  See Dr. Valentina Lucks in 2-3 weeks to check in  At next visit with me , will draw cholesterol per your request (im putting in as future order so schedule lab visit at your convenience to have this drawn-co0uld be before visit with me or Dr. Valentina Lucks).   See me in 6-7 weeks or sooner if Dr. Valentina Lucks advises.   For allergies  Sent in zyrtec for you  For colonoscopy,  We will try to call the stomach doctors for results  For mammogram,  Once you get your eye surgery and it is a good time, please get this done.   Thanks, Dr. Yong Channel  Health Maintenance Due  Topic Date Due  . Tetanus/tdap -try another pharmacy.  03/02/2009  . Mammogram  05/05/2010  . Lipid Panel  10/25/2013

## 2013-12-01 NOTE — Progress Notes (Addendum)
Wendy Reddish, MD Phone: [phone]  Subjective:  Chief complaint-noted  DIABETES Type II Medications taking and tolerating-yes, lantus 30 units in the morning and 7-8 units of novolog before breakfast and dinner. Was taking 14 units at dinner but blood sugar in the morning int he 70s and felt uncomfortable.  Blood Sugars per patient-fasting- 80-140, before lunch 175-180, 2 hours after dinner 170-180 Diet-trouble with portion size, discussed smaller plate and if needs seconds, using vegetable only.  Regular Exercise-not currently, plans to restart 5 minutes of treadmill 5x a week.   On Aspirin-yes On statin-yes Daily foot monitoring-yes  Hemoglobin a1c:  Lab Results  Component Value Date   HGBA1C 9.1 10/14/2013   HGBA1C 9.5 07/03/2013   HGBA1C 11.0 04/03/2013   ROS- Endorses Polydipsia, nocturia 3-4 x.  Slight improvement in vision after cataract surgery. Denies  Hypoglycemia symptoms (shaky, sweaty, hungry, weak anxious, tremor, palpitations, confusion, behavior change) since reducing insulin.   Seasonal allergies Endorses watery itchy eyes especially int he spring. Previously took zyrtec. Would like to restart before things get flared up in springtime.  Ros-no fever/chills. Endorses some sneezing.   Past Medical History- Patient Active Problem List   Diagnosis Date Noted  . Diabetic retinopathy associated with type 2 diabetes mellitus 08/22/2011    Priority: High  . Chronic diastolic heart failure [id number]    Priority: High  . DM (diabetes mellitus), type 2, uncontrolled 11/29/2006    Priority: High  . TOBACCO USE 04/13/2008    Priority: Medium  . HYPERLIPIDEMIA 11/29/2006    Priority: Medium  . OBESITY, BMI 30-35 11/29/2006    Priority: Medium  . HYPERTENSION, BENIGN SYSTEMIC 11/29/2006    Priority: Medium  . Seasonal allergies 12/01/2013   Medications- reviewed and updated Current Outpatient Prescriptions  Medication Sig Dispense Refill  . amLODipine  (NORVASC) 10 MG tablet Take 1 tablet (10 mg total) by mouth daily.  30 tablet  11  . aspirin 81 MG tablet Take 81 mg by mouth daily.        . Calcium Carbonate-Vit D-Min 600-400 MG-UNIT TABS Take by mouth daily.        . cloNIDine (CATAPRES) 0.2 MG tablet Take 1 tablet (0.2 mg total) by mouth 2 (two) times daily.  60 tablet  11  . hydrochlorothiazide (HYDRODIURIL) 25 MG tablet Take 1 tablet (25 mg total) by mouth daily.  30 tablet  11  . insulin aspart (NOVOLOG) 100 UNIT/ML injection Inject 5-10 Units into the skin 3 (three) times daily with meals. 10 units prior to breakfast and dinner      . insulin glargine (LANTUS) 100 UNIT/ML injection Inject 20-60 units into the skin daily as directed by Dr. Yong Channel and Dr. Valentina Lucks  10 mL  10  . lisinopril (PRINIVIL,ZESTRIL) 40 MG tablet TAKE 1 TABLET (40 MG TOTAL) BY MOUTH DAILY.  30 tablet  11  . metFORMIN (GLUCOPHAGE) 500 MG tablet TAKE 2 TABLETS BY MOUTH TWICE A DAY WITH FOOD  120 tablet  3  . metoprolol succinate (TOPROL-XL) 100 MG 24 hr tablet Take 1 tablet (100 mg total) by mouth daily. Take with or immediately following a meal.  30 tablet  11  . pravastatin (PRAVACHOL) 80 MG tablet TAKE 1 TABLET (80 MG TOTAL) BY MOUTH DAILY.  30 tablet  5  . senna-docusate (SENOKOT-S) 8.6-50 MG per tablet Take 1 tablet by mouth at bedtime.  30 tablet  3  . cetirizine (ZYRTEC) 10 MG tablet Take 1 tablet (10 mg total) by mouth  daily.  30 tablet  11  . Tdap (BOOSTRIX) 5-2.5-18.5 LF-MCG/0.5 injection Inject 0.5 mLs into the muscle once.  0.5 mL  0   No current facility-administered medications for this visit.    Objective: BP 149/62  Pulse 74  Temp(Src) 98.5 F (36.9 C) (Oral)  Ht 5\' 1"  (1.549 m)  Wt 188 lb (85.276 kg)  BMI 35.54 kg/m2 Gen: NAD, resting comfortably, smiling CV: RRR no murmurs rubs or gallops Lungs: CTAB no crackles, wheeze, rhonchi Ext: no edema   Assessment/Plan:  DM (diabetes mellitus), type 2, uncontrolled Poorly controlled (but appears  improved per #s). Continue lantus 30 units in the morning. Increase novolog to 10 units before breakfast and dinner. Still not doing lunch as she states simply does not work with her schedule. With previous 8 in AM and 14 in PM, had CBGs into the 70s. Patient will see Dr. Valentina Lucks in 2-3 weeks for follow up and further titration.   Seasonal allergies Trial zyrtec. Follow up if not improved.   Patient also is not sure of need to follow up with GI. I have asked our staff to call GI to see if cologuard was negative.   Future orders for lipids, iron, ferritin were placed as well. Patient will return for these (ride was waiting on her)   Meds ordered this encounter  Medications  . insulin aspart (NOVOLOG) 100 UNIT/ML injection    Sig: Inject 5-10 Units into the skin 3 (three) times daily with meals. 10 units prior to breakfast and dinner  . cetirizine (ZYRTEC) 10 MG tablet    Sig: Take 1 tablet (10 mg total) by mouth daily.    Dispense:  30 tablet    Refill:  11

## 2013-12-02 ENCOUNTER — Telehealth: Payer: Self-pay | Admitting: Family Medicine

## 2013-12-02 NOTE — Telephone Encounter (Signed)
Wendy [name] with [facility] stated a request for diabetic supplies was faxed 10-14-13. Since it was never received she is faxing it again

## 2013-12-03 DIAGNOSIS — H26019 Infantile and juvenile cortical, lamellar, or zonular cataract, unspecified eye: Secondary | ICD-10-CM | POA: Diagnosis not present

## 2013-12-03 DIAGNOSIS — H2510 Age-related nuclear cataract, unspecified eye: Secondary | ICD-10-CM | POA: Diagnosis not present

## 2013-12-03 NOTE — Addendum Note (Signed)
Addended by: Marin Olp on: 12/03/2013 11:17 PM   Modules accepted: Orders

## 2013-12-03 NOTE — Assessment & Plan Note (Signed)
Trial zyrtec. Follow up if not improved.

## 2013-12-03 NOTE — Assessment & Plan Note (Signed)
Poorly controlled (but appears improved per #s). Continue lantus 30 units in the morning. Increase novolog to 10 units before breakfast and dinner. Still not doing lunch as she states simply does not work with her schedule. With previous 8 in AM and 14 in PM, had CBGs into the 70s. Patient will see Dr. Valentina Lucks in 2-3 weeks for follow up and further titration.

## 2013-12-04 NOTE — Telephone Encounter (Signed)
I reviewed my box yesterday and not received. Will check again 12/05/13 as am post call currently.

## 2013-12-05 ENCOUNTER — Other Ambulatory Visit: Payer: Self-pay | Admitting: Family Medicine

## 2013-12-05 ENCOUNTER — Telehealth: Payer: Self-pay | Admitting: Internal Medicine

## 2013-12-05 NOTE — Telephone Encounter (Signed)
Patient notified that we have not received the results yet and will call as soon as we know

## 2014-01-05 ENCOUNTER — Encounter: Payer: Self-pay | Admitting: Pharmacist

## 2014-01-05 ENCOUNTER — Ambulatory Visit (INDEPENDENT_AMBULATORY_CARE_PROVIDER_SITE_OTHER): Payer: Medicare Other | Admitting: Pharmacist

## 2014-01-05 VITALS — BP 176/85 | HR 79 | Ht 62.0 in | Wt 189.4 lb

## 2014-01-05 DIAGNOSIS — IMO0002 Reserved for concepts with insufficient information to code with codable children: Secondary | ICD-10-CM

## 2014-01-05 DIAGNOSIS — IMO0001 Reserved for inherently not codable concepts without codable children: Secondary | ICD-10-CM

## 2014-01-05 DIAGNOSIS — E1165 Type 2 diabetes mellitus with hyperglycemia: Secondary | ICD-10-CM

## 2014-01-05 DIAGNOSIS — F17200 Nicotine dependence, unspecified, uncomplicated: Secondary | ICD-10-CM | POA: Diagnosis not present

## 2014-01-05 DIAGNOSIS — I10 Essential (primary) hypertension: Secondary | ICD-10-CM | POA: Diagnosis not present

## 2014-01-05 MED ORDER — INSULIN GLARGINE 100 UNIT/ML ~~LOC~~ SOLN: 24.0000 [IU] | Freq: Every morning | SUBCUTANEOUS | Status: DC

## 2014-01-05 MED ORDER — CLONIDINE HCL 0.3 MG PO TABS: 0.3000 mg | ORAL_TABLET | Freq: Two times a day (BID) | ORAL | Status: DC

## 2014-01-05 MED ORDER — INSULIN ASPART 100 UNIT/ML ~~LOC~~ SOLN: 8.0000 [IU] | Freq: Three times a day (TID) | SUBCUTANEOUS | Status: DC

## 2014-01-05 NOTE — Progress Notes (Signed)
S:    Patient arrives in good spirits. Presents for diabetes follow-up.   O:  . Lab Results  Component Value Date   HGBA1C 9.1 10/14/2013     Patient did not bring blood sugar meter to visit. Reports 3 past AM FBGs < 100 (one reading in the 70s last week) mostly as a result of not eating the night prior. FBG was 240 this morning due to large meals yesterday- typical FBGs 140-160. BGs throughout the day run in the 200s.   A/P: Diabetes currently uncontrolled.   Reported one blood sugar reading in the 70s but was able to verbalize appropriate hypoglycemia management plan.  Reports adherence with medication.  Control is suboptimal due to large blood sugar swings as a result of high Lantus dose and lack of midday Novolog coverage.  Decreased dose of basal insulin Lantus (insulin glargine) to 24 units daily in the morning.Added a lunchtime dose of rapid insulin Novolog (insulin aspart): use 8 units in the morning, 8 units with lunch, and 12 units with evening meal. Counseled on the importance of consistently eating 3 meals per day, checking blood sugar at least 3 times per day, and taking metformin as prescribed.   Hypertension currently uncontrolled (BP today in clinic: 176/85, pulse 79).  Control is suboptimal due to inadequate medication dosages. Increased clonidine 0.2 mg tablets to 3 times daily until gone, then clonidine 0.3 mg tablets twice daily. If BP still above goal at next visit, consider changing metoprolol succinate to carvedilol. Consider 24-hr BP monitoring in the future.    Tobacco use- patient reports using snuff.   Patient was counseled but was not interested in quitting at this time. Offered support for the future.   Written patient instructions provided.  Follow up in Pharmacist Clinic Visit in 1 month.   Total time in face to face counseling 30 minutes.  Patient seen with Toribio Harbour, PharmD Candidate,  Silas [place],  PharmD Candidate.

## 2014-01-05 NOTE — Assessment & Plan Note (Signed)
Tobacco use- patient reports using snuff.   Patient not interested in quitting at this time. Offered support for the future.

## 2014-01-05 NOTE — Assessment & Plan Note (Signed)
Diabetes currently uncontrolled.   Reported one blood sugar reading in the 70s but was able to verbalize appropriate hypoglycemia management plan.  Reports adherence with medication.  Control is suboptimal due to large blood sugar swings as a result of high Lantus dose and lack of midday Novolog coverage.  Decreased dose of basal insulin Lantus (insulin glargine) to 24 units daily in the morning.Added a lunchtime dose of rapid insulin Novolog (insulin aspart): use 8 units in the morning, 8 units with lunch, and 12 units with evening meal. Counseled on the importance of consistently eating 3 meals per day, checking blood sugar at least 3 times per day, and taking metformin as prescribed.

## 2014-01-05 NOTE — Progress Notes (Signed)
Patient ID: Wendy [name], female   DOB: 27-Dec-1940, 73 y.o.   MRN: [id number] Reveiwed: Agree with Dr. Graylin Shiver management and documentation.

## 2014-01-05 NOTE — Patient Instructions (Addendum)
Decrease insulin glargine (Lantus) to 24 units daily in the morning  Add an insulin aspart (Novolog) injection at lunchtime. Use 8 units in the morning at breakfast, 8 units at lunchtime, and 12 units in the evening with supper.   Try to be consistent with eating 3 meals per day. Continue to check your blood sugar at least 3 times daily.   Take metformin consistently (2 500-mg tablets twice daily) every day.   Increase clonidine: take 0.2 mg tablet: 1 tablet 3 times daily until gone. Then, take 0.3 mg tablets: 1 tablet twice daily  Return to pharm clinic in 1 month. Bring your blood sugar meter or book to the visit.

## 2014-01-05 NOTE — Assessment & Plan Note (Signed)
Hypertension currently uncontrolled (BP today in clinic: 176/85, pulse 79).  Control is suboptimal due to inadequate medication dosages. Increased clonidine 0.2 mg tablets to 3 times daily until gone, then clonidine 0.3 mg tablets twice daily. If BP still above goal at next visit, consider changing metoprolol succinate to carvedilol. Consider 24-hr BP monitoring in the future.  May consider switch of metoprolol to carvedilol in future.

## 2014-01-06 ENCOUNTER — Other Ambulatory Visit: Payer: Self-pay | Admitting: Family Medicine

## 2014-01-09 ENCOUNTER — Other Ambulatory Visit: Payer: Self-pay | Admitting: Family Medicine

## 2014-02-08 ENCOUNTER — Other Ambulatory Visit: Payer: Self-pay | Admitting: Family Medicine

## 2014-02-19 ENCOUNTER — Encounter: Payer: Self-pay | Admitting: Pharmacist

## 2014-02-19 ENCOUNTER — Ambulatory Visit (INDEPENDENT_AMBULATORY_CARE_PROVIDER_SITE_OTHER): Payer: Medicare Other | Admitting: Pharmacist

## 2014-02-19 VITALS — BP 149/83 | HR 89 | Ht 61.02 in | Wt 187.0 lb

## 2014-02-19 DIAGNOSIS — I10 Essential (primary) hypertension: Secondary | ICD-10-CM

## 2014-02-19 DIAGNOSIS — F17200 Nicotine dependence, unspecified, uncomplicated: Secondary | ICD-10-CM

## 2014-02-19 NOTE — Progress Notes (Signed)
S:    Patient arrives in good spirits. Presents for diabetes follow-up appointment.  Patient reports her blood glucose levels average around 200 throughout the day with lower levels of 140 at lunchtime and higher levels sometimes in the 400s in the evening. She reports she has dizziness when her levels are <100 and has had a couple of low levels (70s - likely due to not eating enough food) since her last visit. Patient has had high level of stress lately due to her husband being sick. She tends to eat "sweets" as as comfort food.   Patient reports she has not picked up her new clonidine prescription and has missed the last 3 days.    O:  . Lab Results  Component Value Date   HGBA1C 9.1 10/14/2013     home fasting CBG readings ~ 200 (ranging from 70s to 400s)  BP 149/83 HR 89   A/P: Patient presents with long-standing diabetes that is suboptimal due to stress and diet. Plan is to continue current diabetic regimen, make better diet decisions, and reassess at follow-up appointment. Patient has experienced dizziness due to glucose levels <100 and is able to verbalize appropriate hypoglycemia management plan.  Patients blood pressure is still suboptimal today likely from stress and medication non-compliance. She was instructed to restart clonidine 0.3mg  BID after picking up from her pharmacy today.   Longstanding and ongoing tobacco abuse.  Due to her ongoing stress, patient is not willing to quit using tobacco at this time. Follow-up at next appointment.   Written patient instructions provided.  Follow up in Pharmacist Clinic Visit in late July. Total time in face to face counseling 30 minutes.  Patient seen with Eligah East PharmD Resident.

## 2014-02-19 NOTE — Assessment & Plan Note (Signed)
Longstanding and ongoing tobacco abuse.  Due to her current stress, patient is not willing to quit using tobacco at this time. Follow-up at next appointment.

## 2014-02-19 NOTE — Patient Instructions (Signed)
Thank you for coming to your appointment today!  Please pick up your clonidine from your pharmacy today.  Before your next appointment, try to be more thoughtful about your food choices.  Please make an appointment for late June with Dr. Yong Channel and late July with Dr. Valentina Lucks.

## 2014-02-19 NOTE — Assessment & Plan Note (Signed)
Patient presents with long-standing diabetes that is suboptimal due to stress and diet. Plan is to continue current diabetic regimen, make better diet decisions, and reassess at follow-up appointment. Patient has experienced dizziness due to glucose levels <100 and is able to verbalize appropriate hypoglycemia management plan.  Patients blood pressure is still suboptimal today likely from stress and medication non-compliance. She was instructed to restart clonidine 0.3mg  BID after picking up from her pharmacy today.

## 2014-02-25 NOTE — Progress Notes (Signed)
Patient ID: Wendy [name], female   DOB: 12-Aug-1941, 73 y.o.   MRN: [id number] Reviewed: Agree with Dr. Graylin Shiver management and documentation.

## 2014-03-05 ENCOUNTER — Telehealth: Payer: Self-pay | Admitting: Family Medicine

## 2014-03-10 ENCOUNTER — Other Ambulatory Visit: Payer: Self-pay | Admitting: Family Medicine

## 2014-03-30 ENCOUNTER — Encounter: Payer: Self-pay | Admitting: Family Medicine

## 2014-03-30 ENCOUNTER — Ambulatory Visit (INDEPENDENT_AMBULATORY_CARE_PROVIDER_SITE_OTHER): Payer: Medicare Other | Admitting: Family Medicine

## 2014-03-30 ENCOUNTER — Ambulatory Visit (HOSPITAL_COMMUNITY)
Admission: RE | Admit: 2014-03-30 | Discharge: 2014-03-30 | Disposition: A | Discharge status: Home / Self Care | Payer: Medicare Other | Source: Ambulatory Visit | Attending: Family Medicine | Admitting: Family Medicine

## 2014-03-30 VITALS — BP 178/82 | HR 81 | Temp 98.3°F | Wt 188.0 lb

## 2014-03-30 DIAGNOSIS — IMO0002 Reserved for concepts with insufficient information to code with codable children: Secondary | ICD-10-CM

## 2014-03-30 DIAGNOSIS — E785 Hyperlipidemia, unspecified: Secondary | ICD-10-CM

## 2014-03-30 DIAGNOSIS — I10 Essential (primary) hypertension: Secondary | ICD-10-CM | POA: Diagnosis present

## 2014-03-30 DIAGNOSIS — IMO0001 Reserved for inherently not codable concepts without codable children: Secondary | ICD-10-CM

## 2014-03-30 DIAGNOSIS — I5032 Chronic diastolic (congestive) heart failure: Secondary | ICD-10-CM

## 2014-03-30 DIAGNOSIS — E1165 Type 2 diabetes mellitus with hyperglycemia: Secondary | ICD-10-CM

## 2014-03-30 LAB — POCT GLYCOSYLATED HEMOGLOBIN (HGB A1C): HEMOGLOBIN A1C: 10.2

## 2014-03-30 NOTE — Patient Instructions (Signed)
Hypertension - continue to take medications, f/u to recheck within the week. We will check some basic labs. We will discuss your labs at your follow up visit or will call if need to discuss sooner.   Diabetes - increase Lantus to 26 units daily. Continue 8 units Novalog twice a day with meals.  We need to discuss in the future Health Maintenance Due  Topic Date Due  . Tetanus/tdap  03/02/2009  . Mammogram  05/05/2010

## 2014-03-30 NOTE — Progress Notes (Signed)
Garret Reddish, MD Phone: [phone]  Subjective:   Wendy [name] is a 73 y.o. year old very pleasant female patient who presents with the following:  DIABETES Type II Medications taking and tolerating-yes, lantus 24 units, novolog 8 units before breakfast and dinner. Had to adjust due to low blood sugars in the afternoon and evening. She was supposed to be taking mealtime at lunch.  Blood Sugars per patient-fasting- around 200 in the morning, 140 On Aspirin-yes On statin-yes Daily foot monitoring-yes  ROS- Endorses polyuria up to 4x a night, some mild toe burning. Endorsed hypoglycemia symptoms in afternoon before taking out lunch dose.   Hemoglobin a1c:  Lab Results  Component Value Date   HGBA1C 10.2 03/30/2014   HGBA1C 9.1 10/14/2013   HGBA1C 9.5 07/03/2013   Hypertension Hyperlipidemia BP Readings from Last 3 Encounters:  03/31/14 178/82  02/19/14 149/83  01/05/14 176/85  Home BP monitoring-no Compliant with medications-yes without side effects, 5 BP meds and pravastatin ROS-Denies any CP, HA, SOB, blurry vision, LE edema.   Past Medical History- Patient Active Problem List   Diagnosis Date Noted  . Diabetic retinopathy associated with type 2 diabetes mellitus 08/22/2011    Priority: High  . Chronic diastolic heart failure [id number]    Priority: High  . DM (diabetes mellitus), type 2, uncontrolled 11/29/2006    Priority: High  . TOBACCO USE 04/13/2008    Priority: Medium  . HYPERLIPIDEMIA 11/29/2006    Priority: Medium  . OBESITY, BMI 30-35 11/29/2006    Priority: Medium  . HYPERTENSION, BENIGN SYSTEMIC 11/29/2006    Priority: Medium  . Seasonal allergies 12/01/2013    Priority: Low   Medications- reviewed and updated Current Outpatient Prescriptions  Medication Sig Dispense Refill  . amLODipine (NORVASC) 10 MG tablet Take 1 tablet (10 mg total) by mouth daily.  30 tablet  11  . aspirin 81 MG tablet Take 81 mg by mouth daily.        . cetirizine  (ZYRTEC) 10 MG tablet Take 10 mg by mouth daily.      . cloNIDine (CATAPRES) 0.3 MG tablet Take 1 tablet (0.3 mg total) by mouth 2 (two) times daily.  60 tablet  3  . [facility] SENNA PLUS 8.6-50 MG per tablet TAKE 1 TABLET BY MOUTH AT BEDTIME.  30 tablet  3  . hydrochlorothiazide (HYDRODIURIL) 25 MG tablet Take 1 tablet (25 mg total) by mouth daily.  30 tablet  11  . lisinopril (PRINIVIL,ZESTRIL) 40 MG tablet TAKE 1 TABLET (40 MG TOTAL) BY MOUTH DAILY.  30 tablet  10  . metFORMIN (GLUCOPHAGE) 500 MG tablet TAKE 2 TABLETS BY MOUTH TWICE A DAY WITH FOOD  120 tablet  3  . metoprolol succinate (TOPROL-XL) 100 MG 24 hr tablet Take 1 tablet (100 mg total) by mouth daily. Take with or immediately following a meal.  30 tablet  11  . pravastatin (PRAVACHOL) 80 MG tablet TAKE 1 TABLET (80 MG TOTAL) BY MOUTH DAILY.  30 tablet  5  . B-D INS SYRINGE 0.5CC/31GX5/16 31G X 5/16" 0.5 ML MISC USE AS DIRECTED EVERY DAY  100 each  prn  . Calcium Carbonate-Vit D-Min 600-400 MG-UNIT TABS Take 1 tablet by mouth daily.       . insulin aspart (NOVOLOG) 100 UNIT/ML injection Inject 8-12 Units into the skin 3 (three) times daily with meals. 8 units in the am, 8 at lunch, 12 with evening meal. qs for 1-mth supply  10 mL  3  .  insulin glargine (LANTUS) 100 UNIT/ML injection Inject 26 Units into the skin every morning.      . Tdap (BOOSTRIX) 5-2.5-18.5 LF-MCG/0.5 injection Inject 0.5 mLs into the muscle once.  0.5 mL  0   No current facility-administered medications for this visit.   EKG-sinus rhythm with rate 73, some flattening of t wave in III and v1 but otherwise unremarkable st-t wave changes, non ischemic changes  Results for orders placed in visit on 03/30/14 (from the past 48 hour(s))  POCT GLYCOSYLATED HEMOGLOBIN (HGB A1C)     Status: Abnormal   Collection Time    03/30/14  1:32 PM      Result Value Ref Range   Hemoglobin A1C 10.2    CBC     Status: Abnormal   Collection Time    03/30/14  2:45 PM      Result Value  Ref Range   WBC 7.3  4.0 - 10.5 K/uL   RBC 4.46  3.87 - 5.11 MIL/uL   Hemoglobin 12.4  12.0 - 15.0 g/dL   HCT 35.4 (*) 36.0 - 46.0 %   MCV 79.4  78.0 - 100.0 fL   MCH 27.8  26.0 - 34.0 pg   MCHC 35.0  30.0 - 36.0 g/dL   RDW 15.8 (*) 11.5 - 15.5 %   Platelets 307  150 - 400 K/uL  COMPREHENSIVE METABOLIC PANEL     Status: Abnormal   Collection Time    03/30/14  2:45 PM      Result Value Ref Range   Sodium 142  135 - 145 mEq/L   Potassium 3.9  3.5 - 5.3 mEq/L   Chloride 100  96 - 112 mEq/L   CO2 28  19 - 32 mEq/L   Glucose, Bld 124 (*) 70 - 99 mg/dL   BUN 21  6 - 23 mg/dL   Creat 1.08  0.50 - 1.10 mg/dL   Total Bilirubin 0.3  0.2 - 1.2 mg/dL   Alkaline Phosphatase 78  39 - 117 U/L   AST 12  0 - 37 U/L   ALT <8  0 - 35 U/L   Total Protein 7.3  6.0 - 8.3 g/dL   Albumin 4.4  3.5 - 5.2 g/dL   Calcium 9.6  8.4 - 10.5 mg/dL  FERRITIN     Status: None   Collection Time    03/30/14  2:45 PM      Result Value Ref Range   Ferritin 134  10 - 291 ng/mL  IRON     Status: None   Collection Time    03/30/14  2:45 PM      Result Value Ref Range   Iron 63  42 - 145 ug/dL  LIPID PANEL     Status: Abnormal   Collection Time    03/30/14  2:45 PM      Result Value Ref Range   Cholesterol 206 (*) 0 - 200 mg/dL   Comment: ATP III Classification:           < 200        mg/dL        Desirable          200 - 239     mg/dL        Borderline High          >= 240        mg/dL        High  Triglycerides 194 (*) <150 mg/dL   HDL 54  >39 mg/dL   Total CHOL/HDL Ratio 3.8     VLDL 39  0 - 40 mg/dL   LDL Cholesterol 113 (*) 0 - 99 mg/dL   Comment:       Total Cholesterol/HDL Ratio:CHD Risk                            Coronary Heart Disease Risk Table                                            Men       Women              1/2 Average Risk              3.4        3.3                  Average Risk              5.0        4.4               2X Average Risk              9.6        7.1                3X Average Risk             23.4       11.0     Use the calculated Patient Ratio above and the CHD Risk table      to determine the patient's CHD Risk.     ATP III Classification (LDL):           < 100        mg/dL         Optimal          100 - 129     mg/dL         Near or Above Optimal          130 - 159     mg/dL         Borderline High          160 - 189     mg/dL         High           > 190        mg/dL         Very High          Objective: BP 178/82  Pulse 81  Temp(Src) 98.3 F (36.8 C) (Oral)  Wt 188 lb (85.276 kg) Gen: NAD, resting comfortably in chair Eyes: limited fundoscopic exam without obvious blurred optic disc.  CV: RRR no murmurs rubs or gallops Lungs: CTAB no crackles, wheeze, rhonchi Abdomen: soft/nontender/nondistended/normal bowel sounds. No rebound or guarding.  Ext: no edema Skin: warm, dry, no rash  Neuro: CN II-XII intact, sensation and reflexes normal throughout, 5/5 muscle strength in bilateral upper and lower extremities. Normal finger to nose. Normal rapid alternating movements. Normal gait.   Assessment/Plan:  DM (diabetes mellitus), type 2, uncontrolled Worsening a1c. Patient noncompliant with mealtime novolog at lunch. She had told me previously this is very difficult for her to  take. She had been willing to try again after last pharmacy visit but she both tells me it is too difficult for her lifestyle and she gets afternoon lows. We will only make a minor adjustment today and increase lantus to 26 from 24 units given fasting CBGs typically 200 (could likely go up more). She will log her sugars and return later this week for further titration. She is going to continue 8 units in Am and before dinner of novolog.   HYPERTENSION, BENIGN SYSTEMIC Typically when elevated in past, patient admits to noncompliance. Today she is insistent over last few days that she has been compliant including today. Despite this, BP has increased in comparison to last  visit. Patient is now on 5 medications including clonidine at 0.3mg  BID. This is certainly resistant hypertension at this point and desireves further evaluation. No hypokalemia to suggest hypoaldosteronism but this does not rule it out so would obtain plasma aldosterone/renin ratio. Could consider duplex doppler ultrasonography to assess for renovascular issues (Creatinine normal but does not rule out). Assess for OSA.  Doubt pheochromocytoma, cushings, or aortic coarcation and would not initially plan to evaluate for these. I have asked her to return later this week to discuss this topic. Could start with ambulatory monitoring with Dr. Valentina Lucks.  Focus of visit today was to make sure there was no end organ damage. She had a normal neuro exam, reassuring EKG, normal heart, lung exam, limited but normal fundoscopic exam for optic disc blurring. We obtained CBC, CMET which were essentially normal.   HYPERLIPIDEMIA Doing well with pravastatin. Has had intolerance and cost issues with other medications. With LDL 113, will continue pravastatin for now though ahd hoped for LDL <100.   Iron and ferritin were drawn for pharmacy study on low iron.  Patient to return later this week for further evaluation and repeat BP measurement.  Meds ordered this encounter  Medications  . insulin glargine (LANTUS) 100 UNIT/ML injection    Sig: Inject 26 Units into the skin every morning.

## 2014-03-31 ENCOUNTER — Encounter: Payer: Self-pay | Admitting: Family Medicine

## 2014-03-31 LAB — COMPREHENSIVE METABOLIC PANEL
ALK PHOS: 78 U/L (ref 39–117)
ALT: <8 U/L (ref 0–35)
AST: 12 U/L (ref 0–37)
Albumin: 4.4 g/dL (ref 3.5–5.2)
BUN: 21 mg/dL (ref 6–23)
CO2: 28 mEq/L (ref 19–32)
CREATININE: 1.08 mg/dL (ref 0.50–1.10)
Calcium: 9.6 mg/dL (ref 8.4–10.5)
Chloride: 100 mEq/L (ref 96–112)
Glucose, Bld: 124 mg/dL — ABNORMAL HIGH (ref 70–99)
Potassium: 3.9 mEq/L (ref 3.5–5.3)
Sodium: 142 mEq/L (ref 135–145)
Total Bilirubin: 0.3 mg/dL (ref 0.2–1.2)
Total Protein: 7.3 g/dL (ref 6.0–8.3)

## 2014-03-31 LAB — IRON: Iron: 63 ug/dL (ref 42–145)

## 2014-03-31 LAB — CBC
HCT: 35.4 % — ABNORMAL LOW (ref 36.0–46.0)
Hemoglobin: 12.4 g/dL (ref 12.0–15.0)
MCH: 27.8 pg (ref 26.0–34.0)
MCHC: 35 g/dL (ref 30.0–36.0)
MCV: 79.4 fL (ref 78.0–100.0)
Platelets: 307 10*3/uL (ref 150–400)
RBC: 4.46 MIL/uL (ref 3.87–5.11)
RDW: 15.8 % — AB (ref 11.5–15.5)
WBC: 7.3 10*3/uL (ref 4.0–10.5)

## 2014-03-31 LAB — LIPID PANEL
CHOL/HDL RATIO: 3.8 ratio
Cholesterol: 206 mg/dL — ABNORMAL HIGH (ref 0–200)
HDL: 54 mg/dL (ref >39)
LDL CALC: 113 mg/dL — AB (ref 0–99)
TRIGLYCERIDES: 194 mg/dL — AB (ref <150)
VLDL: 39 mg/dL (ref 0–40)

## 2014-03-31 LAB — FERRITIN: Ferritin: 134 ng/mL (ref 10–291)

## 2014-03-31 NOTE — Assessment & Plan Note (Signed)
Doing well with pravastatin. Has had intolerance and cost issues with other medications. With LDL 113, will continue pravastatin for now though ahd hoped for LDL <100.

## 2014-03-31 NOTE — Assessment & Plan Note (Addendum)
Typically when elevated in past, patient admits to noncompliance. Today she is insistent over last few days that she has been compliant including today. Despite this, BP has increased in comparison to last visit. Patient is now on 5 medications including clonidine at 0.3mg  BID. This is certainly resistant hypertension at this point and desireves further evaluation. No hypokalemia to suggest hypoaldosteronism but this does not rule it out so would obtain plasma aldosterone/renin ratio. Could consider duplex doppler ultrasonography to assess for renovascular issues (Creatinine normal but does not rule out). Assess for OSA.  Doubt pheochromocytoma, cushings, or aortic coarcation and would not initially plan to evaluate for these. I have asked her to return later this week to discuss this topic. Could start with ambulatory monitoring with Dr. Valentina Lucks.  Focus of visit today was to make sure there was no end organ damage. She had a normal neuro exam, reassuring EKG, normal heart, lung exam, limited but normal fundoscopic exam for optic disc blurring. We obtained CBC, CMET which were essentially normal.

## 2014-03-31 NOTE — Assessment & Plan Note (Signed)
Worsening a1c. Patient noncompliant with mealtime novolog at lunch. She had told me previously this is very difficult for her to take. She had been willing to try again after last pharmacy visit but she both tells me it is too difficult for her lifestyle and she gets afternoon lows. We will only make a minor adjustment today and increase lantus to 26 from 24 units given fasting CBGs typically 200 (could likely go up more). She will log her sugars and return later this week for further titration. She is going to continue 8 units in Am and before dinner of novolog.

## 2014-04-01 ENCOUNTER — Ambulatory Visit: Payer: Medicare Other | Admitting: Family Medicine

## 2014-04-02 ENCOUNTER — Ambulatory Visit (INDEPENDENT_AMBULATORY_CARE_PROVIDER_SITE_OTHER): Payer: Medicare Other | Admitting: Family Medicine

## 2014-04-02 ENCOUNTER — Encounter: Payer: Self-pay | Admitting: Family Medicine

## 2014-04-02 VITALS — BP 138/53 | HR 66 | Temp 97.9°F | Resp 16 | Wt 186.0 lb

## 2014-04-02 DIAGNOSIS — E1165 Type 2 diabetes mellitus with hyperglycemia: Secondary | ICD-10-CM

## 2014-04-02 DIAGNOSIS — IMO0002 Reserved for concepts with insufficient information to code with codable children: Secondary | ICD-10-CM

## 2014-04-02 DIAGNOSIS — I10 Essential (primary) hypertension: Secondary | ICD-10-CM | POA: Diagnosis not present

## 2014-04-02 DIAGNOSIS — IMO0001 Reserved for inherently not codable concepts without codable children: Secondary | ICD-10-CM

## 2014-04-02 NOTE — Patient Instructions (Signed)
It was good to meet you today.  Please record your blood sugars on a log until your next visit.  If you do record your blood pressure, write that down as well.

## 2014-04-02 NOTE — Progress Notes (Signed)
Patient ID: TASHIKA LOSI, female   DOB: November 10, 1940, 73 y.o.   MRN: [id number]   Subjective:    Patient ID: [name], female    DOB: June 26, 1941, 73 y.o.   MRN: [id number]  HPI  CC: f/u for htn and DM  # Hypertension:  Taking meds as prescribed: amlodipine 10mg , Lisinopril 40mg , HCTZ 25mg , clonidine 0.3mg  BID, Toprolol XL 100mg  ROS: No CP, SOB, changes in vision, lightheadedness/dizziness, syncope  # Diabetes  On 26 units lantus, novolog 8 units in AM and before dinner  Sugars mostly 200-250, highest recorded 300  AM low: 130-140. AM High: 260 ROS: no changes in vision, no numbness/tingling in lower extremity, does have some burning of toes  Review of Systems   See HPI for ROS. Objective:  BP 138/53  Pulse 66  Temp(Src) 97.9 F (36.6 C) (Oral)  Resp 16  Wt 186 lb (84.369 kg)  SpO2 99%  General: NAD HEENT: PERRL, EOMI. CV: RRR, normal heart sounds, no murmurs appreciated Resp: CTAB, normal effort Ext: no edema or cyanosis  Assessment & Plan:  See Problem List Documentation

## 2014-04-04 NOTE — Assessment & Plan Note (Signed)
A: below target goal today while on medications. Had cmet drawn last visit that was normal.  P: continue current meds: amlodipine 10mg , Lisinopril 40mg , HCTZ 25mg , clonidine 0.3mg  BID, Toprolol XL 100mg . Patient to record any daily BPs she has done. Will need to discuss possible 24hr monitoring with pharm clinic.

## 2014-04-04 NOTE — Assessment & Plan Note (Addendum)
Did not bring in log, progress note sugars based on recall.  P: Asked patient to write down sugars in daily log and bring in to next visit; based on reported sugars today can titrate up slightly on both meal coverage and lantus. Asked to follow up in 1 month but patient reports difficulty with transport, agreed on toward middle/end of August.

## 2014-04-05 ENCOUNTER — Other Ambulatory Visit: Payer: Self-pay | Admitting: Family Medicine

## 2014-04-12 ENCOUNTER — Other Ambulatory Visit: Payer: Self-pay | Admitting: Family Medicine

## 2014-05-05 ENCOUNTER — Other Ambulatory Visit: Payer: Self-pay | Admitting: Family Medicine

## 2014-05-05 NOTE — Telephone Encounter (Signed)
Please advise 

## 2014-05-20 ENCOUNTER — Other Ambulatory Visit: Payer: Self-pay | Admitting: Family Medicine

## 2014-05-20 NOTE — Telephone Encounter (Signed)
Will refill clonidine for now to prevent rebound htn but am very concerned regarding age and multiple medications. Have a feeling she is non compliant therefore making it extremely difficult to titrate appropriately. Needs to be seen by pharm clinic [facility], [place]

## 2014-05-21 NOTE — Telephone Encounter (Signed)
Pt informed of refill for clonidine and need for an appointment with Dr. Valentina Lucks.  Pt stated she will call back to schedule appointment with Dr. Valentina Lucks.  Hassell Done, Rosine Beat, RN

## 2014-05-29 ENCOUNTER — Ambulatory Visit (INDEPENDENT_AMBULATORY_CARE_PROVIDER_SITE_OTHER): Payer: Medicare Other | Admitting: Pharmacist

## 2014-05-29 ENCOUNTER — Encounter: Payer: Self-pay | Admitting: Pharmacist

## 2014-05-29 VITALS — BP 142/64 | HR 78 | Ht 62.5 in | Wt 185.9 lb

## 2014-05-29 DIAGNOSIS — IMO0001 Reserved for inherently not codable concepts without codable children: Secondary | ICD-10-CM

## 2014-05-29 DIAGNOSIS — I10 Essential (primary) hypertension: Secondary | ICD-10-CM

## 2014-05-29 DIAGNOSIS — F17200 Nicotine dependence, unspecified, uncomplicated: Secondary | ICD-10-CM

## 2014-05-29 DIAGNOSIS — E1165 Type 2 diabetes mellitus with hyperglycemia: Secondary | ICD-10-CM

## 2014-05-29 DIAGNOSIS — IMO0002 Reserved for concepts with insufficient information to code with codable children: Secondary | ICD-10-CM

## 2014-05-29 MED ORDER — CARVEDILOL 25 MG PO TABS: 25.0000 mg | ORAL_TABLET | Freq: Two times a day (BID) | ORAL | Status: DC

## 2014-05-29 NOTE — Assessment & Plan Note (Signed)
Diabetes uncontrolled. Reports adherence with medication. Reports 1 episode of hypoglycemia but knew to drink OJ and then eat a meal when this happens. Control is suboptimal because she adjusts her own insulin with no plan from a healthcare provider. Since patient desires control of her insulin regimen, RX clinic provided her with a plan. Continue Lantus 20 units each morning. Continue Novolog at 8 units with a small breakfast (cereal), and 10 units if she eats a large breakfast (eggs, toast...).  On days that she eats a midday meal, take 8 units. Do not take Novolog if she skips her midday meal. At her evening meal, take 8 units of Novolog.  If she eats a larger evening meal, take 10 units.

## 2014-05-29 NOTE — Assessment & Plan Note (Signed)
Snuff tobacco use, long-standing. Pt remains unwilling to quit at this time because of stress.

## 2014-05-29 NOTE — Patient Instructions (Signed)
Dear Ms. Ronnald Ramp,   Thank you for coming in today!   Continue Lantus 20 units each morning.   Continue Novolog. Take 8 units with a small breakfast (cereal), and 10 units if you eat a large breakfast (eggs, toast...).  On days that you eat a midday meal, take 8 units. Do not take Novolog if you skip your midday meal. At your evening meal, take 8 units of Novolog.  If you eat a larger evening meal, take 10 units.   Please continue to check your blood sugars and bring your numbers into your next appointment.   After you finish this bottle of metoprolol, please stop taking the metoprolol and begin taking carvedilol, 25 mg twice a day.    Please buy a blood pressure cuff the next time you go to the pharmacy.  The pharmacist can help you pick one out if you need help.  Please record your numbers with the date and time and bring them into your next appointment with Dr. Valentina Lucks in 3-4 weeks.

## 2014-05-29 NOTE — Progress Notes (Signed)
S:    Patient arrives pleasant, but stressed and sad. Husband is ill and he is going through the process for [facility] placement. Pt spends a lot of time caring for her husband currently. Pt has longstanding hypertension presents for current suboptimal blood pressure control. Also has longstanding uncontrolled diabetes. Longstanding use of snuff tobacco.   Pt reports that she is adherent to all medications. Current BP Medications include:  Clonidine 0.3 mg bid, amlodipine 10 mg daily, metoprolol succinate SR 100 mg daily, HCTZ 25 mg daily, lisinopril 40 mg daily.   Pt reports that most of her blood glucose readings are in the 200s.  FBG this AM: 200. Reports one value <80 in the past month. Pt adjusts her own doses of Lantus and refused lunch-time insulin at her last f/u w/ PCP because she gets lows in the afternoon.  This only happens when she doesn't eat lunch, though, which is 3-4 days/week.   O:  Last 3 Office BP readings: 149/83 mmHg 178/82 mmHg 138/53 mmHg  Today's Office BP reading: 149/64 mmHg (manual reading)  BMET    Component Value Date/Time   NA 142 03/30/2014 1445   K 3.9 03/30/2014 1445   CL 100 03/30/2014 1445   CO2 28 03/30/2014 1445   GLUCOSE 124* 03/30/2014 1445   BUN 21 03/30/2014 1445   CREATININE 1.08 03/30/2014 1445   CREATININE 0.95 09/03/2009 2039   CALCIUM 9.6 03/30/2014 1445   GFRNONAA 55* 05/04/2008 1200   GFRAA  Value: >60        The eGFR has been calculated using the MDRD equation. This calculation has not been validated in all clinical 05/04/2008 1200   Lab Results  Component Value Date   HGBA1C 10.2 03/30/2014     A/P: HTN suboptimally controlled. Pt currently on optimal doses but not on maximum dose of metoprolol. BP is improved but further blood pressure lowering desired because of concurrent T2DM. Switched metoprolol to carvedilol 25 mg bid for further blood pressure lowering effects.   Diabetes uncontrolled. Reports adherence with medication. Reports 1 episode of  hypoglycemia but knew to drink OJ and then eat a meal when this happens. Control is suboptimal because she adjusts her own insulin with no plan from a healthcare provider. Since patient desires control of her insulin regimen, RX clinic provided her with a plan. Continue Lantus 20 units each morning. Continue Novolog at 8 units with a small breakfast (cereal), and 10 units if she eats a large breakfast (eggs, toast...).  On days that she eats a midday meal, take 8 units. Do not take Novolog if she skips her midday meal. At her evening meal, take 8 units of Novolog.  If she eats a larger evening meal, take 10 units.   Tobacco use, long-standing. Pt remains unwilling to quit at this time because of stress.    Next A1C anticipated 06/30/14.  Written patient instructions provided.  F/U Clinic Visit with Dr. Valentina Lucks in 2-3 weeks.  Total time in face-to-face counseling 45 minutes.  Patient seen with Kurtis Bushman PharmD Candidate, Soyla Dryer PharmD resident, and Nicoletta Ba, PharmD resident.

## 2014-05-29 NOTE — Assessment & Plan Note (Signed)
HTN suboptimally controlled. Pt currently on optimal doses but not on maximum dose of metoprolol. BP is improved but further blood pressure lowering desired because of concurrent T2DM. Switched metoprolol to carvedilol 25 mg bid for further blood pressure lowering effects.

## 2014-05-29 NOTE — Progress Notes (Signed)
Patient ID: Wendy [name], female   DOB: 09/15/1941, 73 y.o.   MRN: [id number] Reviewed: Agree with the documentation and management by Dr. Valentina Lucks.

## 2014-06-03 ENCOUNTER — Telehealth: Payer: Self-pay | Admitting: Family Medicine

## 2014-06-03 NOTE — Telephone Encounter (Signed)
Pt being switched from metoprolol to carvedilol [facility], [place]

## 2014-07-07 ENCOUNTER — Telehealth: Payer: Self-pay | Admitting: Family Medicine

## 2014-07-07 NOTE — Telephone Encounter (Signed)
Pt states she saw you at [facility] and would like to know if she can schedule w/ you again? No new pt appt until march. Pt has been seeing dr Skeet Simmer after you left. Pt wants to know if she can schedule before march , or should she stay w/ Dr Skeet Simmer until then?

## 2014-07-07 NOTE — Telephone Encounter (Signed)
Happy to see her in clinic if she would like Also happy for her to f/up with Dr. Yong Channel at his new practice [facility], [place]

## 2014-07-07 NOTE — Telephone Encounter (Signed)
As always, prefer for family medicine patient's to stay at the family practice program. Patient has not even met Dr. Skeet Simmer yet and I would love for her to at least meet her before making a decision to switch. Dr. Skeet Simmer is a fantastic physician and I believe she and Mr. Obie would get along well. She needs to follow up with Dr. Skeet Simmer at this point for repeat a1c so ask her to call and schedule this.

## 2014-07-09 NOTE — Telephone Encounter (Signed)
Pt is aware and verbalized understanding.

## 2014-08-07 ENCOUNTER — Other Ambulatory Visit: Payer: Self-pay | Admitting: *Deleted

## 2014-08-07 MED ORDER — AMLODIPINE BESYLATE 10 MG PO TABS: ORAL_TABLET | ORAL | Status: DC

## 2014-08-31 ENCOUNTER — Encounter: Payer: Self-pay | Admitting: Pharmacist

## 2014-08-31 ENCOUNTER — Ambulatory Visit (INDEPENDENT_AMBULATORY_CARE_PROVIDER_SITE_OTHER): Payer: Medicare Other | Admitting: Pharmacist

## 2014-08-31 VITALS — BP 115/70 | HR 68 | Ht 62.0 in | Wt 187.8 lb

## 2014-08-31 DIAGNOSIS — E1165 Type 2 diabetes mellitus with hyperglycemia: Secondary | ICD-10-CM

## 2014-08-31 DIAGNOSIS — IMO0002 Reserved for concepts with insufficient information to code with codable children: Secondary | ICD-10-CM

## 2014-08-31 LAB — POCT GLYCOSYLATED HEMOGLOBIN (HGB A1C): HEMOGLOBIN A1C: 10.4

## 2014-08-31 MED ORDER — INSULIN ASPART 100 UNIT/ML ~~LOC~~ SOLN: 10.0000 [IU] | Freq: Three times a day (TID) | SUBCUTANEOUS | Status: DC

## 2014-08-31 MED ORDER — INSULIN GLARGINE 100 UNIT/ML ~~LOC~~ SOLN: 20.0000 [IU] | Freq: Every morning | SUBCUTANEOUS | Status: DC

## 2014-08-31 NOTE — Progress Notes (Signed)
Patient ID: Wendy [name], female   DOB: [birth year], 73 y.o.   MRN: [id number] Reviewed: Agree with Dr. Graylin Shiver documentation and management.

## 2014-08-31 NOTE — Progress Notes (Signed)
S:    Patient arrives in good spirits however affect is slightly down today compared to previous encounters.  Presents for diabetes follow up.  Patient reports having history of Diabetes for 20 years.    She also reports being stressed caring for her husband who attends P[facility] and continues to be agitated and stressful to her during the night.   Reports that she takes Lantus 20 units daily and Novolog 8 units with breakfast and supper.  Reports taking her medications and not running out however she does admit to only taking two doses of novolog daily.  She is willing to take the third meal-time insulin injection after her A1c showed similar poor control as last check.    O:  . Lab Results  Component Value Date   HGBA1C 10.2 03/30/2014     home fasting CBG readings of 250 2 hour post-prandial/random CBG readings of ~250 with range of 80- 350  A/P: Diabetes diagnosed ~ 20 years ago and currently similar poor control to previous check.   Denies hypoglycemic events and is able to verbalize appropriate hypoglycemia management plan.  Reports adherence with medication.  Control is suboptimal due to possible dental infection, stress of husband, and suboptimal regimen of insulin.  Continued basal insulin Lantus (insulin glargine) at  20 units. Increased dose of rapid insulin Novolog (insulin aspart) to 10 units prior to each meal (three times daily).   Patient verbalized treatment plan.   Written patient instructions provided.  Follow up in Pharmacist Clinic Visit in early [place].   Total time in face to face counseling 30 minutes.  Patient seen with Nicoletta Ba, PharmD resident.   Chronic tobacco abuse.  Due to stress of home life dealing with husband patient is unlikely to make major change in 1/2 can of chew tobacco daily.   Encouraged continued efforts to reduce intake of tobacco.

## 2014-08-31 NOTE — Assessment & Plan Note (Addendum)
Diabetes diagnosed ~ 20 years ago and currently similar poor control to previous check.   Denies hypoglycemic events and is able to verbalize appropriate hypoglycemia management plan.  Reports adherence with medication.  Control is suboptimal due to possible dental infection, stress of husband, and suboptimal regimen of insulin.  Continued basal insulin Lantus (insulin glargine) at 20 units daily. Increased dose of rapid insulin Novolog (insulin aspart) to 10 units prior to each meal (three times daily).   Patient verbalized treatment plan.   Written patient instructions provided.  Follow up in Pharmacist Clinic Visit in early [place].   Total time in face to face counseling 30 minutes.  Patient seen with Nicoletta Ba, PharmD resident.

## 2014-08-31 NOTE — Patient Instructions (Signed)
Novolog 10 units three time daily prior to meals.  Remember - 1 day at a time.   Next visit in early January - Rx Clinic.

## 2014-09-01 ENCOUNTER — Other Ambulatory Visit: Payer: Self-pay | Admitting: Family Medicine

## 2014-09-14 DIAGNOSIS — E11351 Type 2 diabetes mellitus with proliferative diabetic retinopathy with macular edema: Secondary | ICD-10-CM | POA: Diagnosis not present

## 2014-09-16 DIAGNOSIS — E11349 Type 2 diabetes mellitus with severe nonproliferative diabetic retinopathy without macular edema: Secondary | ICD-10-CM | POA: Diagnosis not present

## 2014-09-16 DIAGNOSIS — H3560 Retinal hemorrhage, unspecified eye: Secondary | ICD-10-CM | POA: Diagnosis not present

## 2014-09-16 DIAGNOSIS — E11339 Type 2 diabetes mellitus with moderate nonproliferative diabetic retinopathy without macular edema: Secondary | ICD-10-CM | POA: Diagnosis not present

## 2014-09-16 DIAGNOSIS — E11311 Type 2 diabetes mellitus with unspecified diabetic retinopathy with macular edema: Secondary | ICD-10-CM | POA: Diagnosis not present

## 2014-09-16 DIAGNOSIS — E10341 Type 1 diabetes mellitus with severe nonproliferative diabetic retinopathy with macular edema: Secondary | ICD-10-CM | POA: Diagnosis not present

## 2014-10-06 ENCOUNTER — Ambulatory Visit (INDEPENDENT_AMBULATORY_CARE_PROVIDER_SITE_OTHER): Payer: Medicare Other | Admitting: Pharmacist

## 2014-10-06 VITALS — BP 174/78 | HR 112 | Ht 62.0 in | Wt 185.0 lb

## 2014-10-06 DIAGNOSIS — I10 Essential (primary) hypertension: Secondary | ICD-10-CM

## 2014-10-06 DIAGNOSIS — IMO0002 Reserved for concepts with insufficient information to code with codable children: Secondary | ICD-10-CM

## 2014-10-06 DIAGNOSIS — E1165 Type 2 diabetes mellitus with hyperglycemia: Secondary | ICD-10-CM | POA: Diagnosis not present

## 2014-10-06 MED ORDER — CLONIDINE HCL 0.1 MG PO TABS
0.3000 mg | ORAL_TABLET | Freq: Once | ORAL | Status: AC
  Administered 2014-10-06: 0.3 mg via ORAL

## 2014-10-06 NOTE — Assessment & Plan Note (Signed)
Hypertension: uncontrolled hypertension secondary to missed doses of clonidine. Gave patient clonidine 0.3 mg during visit and patient to pick up refills at her pharmacy today. No other changes at this time, will continue to monitor.

## 2014-10-06 NOTE — Patient Instructions (Signed)
It was nice to see you today!  Pick up your clonidine from your pharmacy today.  Increase your Lantus to 24 units every day  Start taking Novolog 8 units with each meal  Come back and see [name] later this month.

## 2014-10-06 NOTE — Progress Notes (Signed)
S:    Patient arrives in good spirits. Presents for diabetes follow up. Patient reports that there was a death in the family over the weekend and that her sister was admitted to the hospital for heart failure a few months ago - therefore she has been very stressed. She reports that her husband is doing about the same.  Of note, patient reports that she is having laser surgery on her eye tomorrow. She reports that she has a bleed in her eye that is likely due to her diabetes.   Currently taking Lantus 20 units and Novolog 10 units twice a day. Also takes metformin 500 mg twice daily. She denies taking a third dose of Novolog due to hypoglycemia and not being home during lunch. She reports treating hypoglycemia with a glass of orange juice.   She reports nocturia multiple times a night. She reports neuropathy, with tingling in her feet but reports that it has been stable. She reports changes in her vision, which she was told by an eye doctor was due to her diabetes. She is to have more teeth removed by the dentist in the near future.   She reports that she can tell which foods will cause her blood glucose to greatly increase, such as peanut butter and fruits like bananas and oranges. She was wondering if maybe she should start taking insulin with her snacks that are high in sugar.   Patient reports that she has missed her clonidine doses since Sunday of this week. She was going to get her clonidine prior to her visit today but did not have time do.      O:  . Lab Results  Component Value Date   HGBA1C 10.4 08/31/2014    BP Readings from Last 3 Encounters:  10/06/14 174/78  08/31/14 115/70  05/29/14 142/64    home fasting CBG readings of 70s-200s 2 hour post-prandial/random CBG readings of 70s - 399  A/P: Type 2 Diabetes: Longstanding history of diabetes. Reports hypoglycemic events and is able to verbalize appropriate hypoglycemia management plan.  Reports adherence with medication  except for lunch time dose of Novolog. Control is suboptimal due to stress, noncompliance with TID dosing of Novolog, and possible dental infection.  Increased dose of basal insulin Lantus (insulin glargine) to 24 units daily. Decreased dose of rapid insulin Novolog (insulin aspart) to 8 units TID. Continued metformin. Instructed patient to take insulin with any meal or large snacks, such as sandwiches. Patient to call clinic with any hypoglycemic events.  Hypertension: uncontrolled hypertension secondary to missed doses of clonidine. Gave patient clonidine 0.3 mg during visit and patient to pick up refills at her pharmacy today. No other changes at this time, will continue to monitor.   Next A1C anticipated February 2016.  Written patient instructions provided.  Follow up in Pharmacist Clinic Visit on January 29th, 2016.   Total time in face to face counseling 30 minutes.  Patient seen with Randell Patient, PharmD Candidate, Milus Glazier,  PharmD Resident, and Nicoletta Ba, PharmD resident.  Marland Kitchen

## 2014-10-06 NOTE — Assessment & Plan Note (Signed)
Type 2 Diabetes: Longstanding history of diabetes. Reports hypoglycemic events and is able to verbalize appropriate hypoglycemia management plan.  Reports adherence with medication except for lunch time dose of Novolog. Control is suboptimal due to stress, noncompliance with TID dosing of Novolog, and possible dental infection.  Increased dose of basal insulin Lantus (insulin glargine) to 24 units daily. Decreased dose of rapid insulin Novolog (insulin aspart) to 8 units TID. Continued metformin. Instructed patient to take insulin with any meal or large snacks, such as sandwiches. Patient to call clinic with any hypoglycemic events.

## 2014-10-07 ENCOUNTER — Encounter: Payer: Self-pay | Admitting: Pharmacist

## 2014-10-07 NOTE — Progress Notes (Signed)
Patient ID: Wendy [name], female   DOB: 1941/06/24, 74 y.o.   MRN: [id number] Reviewed: Agree with Dr. Graylin Shiver documentation and management.

## 2014-10-09 DIAGNOSIS — E11339 Type 2 diabetes mellitus with moderate nonproliferative diabetic retinopathy without macular edema: Secondary | ICD-10-CM | POA: Diagnosis not present

## 2014-10-09 DIAGNOSIS — E11311 Type 2 diabetes mellitus with unspecified diabetic retinopathy with macular edema: Secondary | ICD-10-CM | POA: Diagnosis not present

## 2014-10-12 DIAGNOSIS — E11311 Type 2 diabetes mellitus with unspecified diabetic retinopathy with macular edema: Secondary | ICD-10-CM | POA: Diagnosis not present

## 2014-10-30 ENCOUNTER — Encounter: Payer: Self-pay | Admitting: Pharmacist

## 2014-10-30 ENCOUNTER — Ambulatory Visit (INDEPENDENT_AMBULATORY_CARE_PROVIDER_SITE_OTHER): Payer: Medicare Other | Admitting: Pharmacist

## 2014-10-30 VITALS — BP 133/76 | HR 90 | Ht 62.0 in | Wt 178.4 lb

## 2014-10-30 DIAGNOSIS — IMO0002 Reserved for concepts with insufficient information to code with codable children: Secondary | ICD-10-CM

## 2014-10-30 DIAGNOSIS — I10 Essential (primary) hypertension: Secondary | ICD-10-CM | POA: Diagnosis not present

## 2014-10-30 DIAGNOSIS — E1165 Type 2 diabetes mellitus with hyperglycemia: Secondary | ICD-10-CM | POA: Diagnosis not present

## 2014-10-30 NOTE — Progress Notes (Signed)
Patient ID: RUAH NOETH, female   DOB: [birth year], 74 y.o.   MRN: [id number] Reviewed: Agree with Dr. Graylin Shiver documentation and management.

## 2014-10-30 NOTE — Patient Instructions (Signed)
It was good to see you today [name]!  Continue Lantus 20 units every morning.  Continue metformin 500 mg twice daily  Use a sliding scale for your Novolog like you have been but if you blood glucose is >250, then take 12 units.   We will consider adding another medication at the next visit.   Come back and see [name] in 4 weeks in the Pharmacy Clinic.

## 2014-10-30 NOTE — Assessment & Plan Note (Signed)
Longstanding diabetes currently uncontrolled with metformin 500 mg BID, Lantus 20 units daily, and Novolog 8-10 units BID.   Reports hypoglycemic events and is able to verbalize appropriate hypoglycemia management plan.  Reports adherence with medication but will sometimes change the doses based on her blood glucose readings. Control is suboptimal due to inadequate insulin dosing and sporadic eating habits. Continued basal insulin Lantus (insulin glargine) at 20 units every morning (patient has been taking 20 units instead of the prescribed 24 units).  Increased dose of rapid insulin Novolog (insulin aspart) to 8-12 units with each meal or large snack. She feels comfortable adjusting her insulin based on her blood glucose. She will start using 12 units if her blood glucose is >250 mg/dL (patient agreed that at this level of blood glucose, she would like to try using a higher dose of Novolog. Will consider addition of GLP agonist at next visit if she continues to have a wide range of blood glucose readings. Patient to continue to monitor blood glucose multiple times a day and to bring meter to next visit.   Longstanding hypertension currently controlled on amlodipine, hydrochlorothiazide, clonidine, carvedilol, and lisinopril. Patient reports adherence to medications. No changes to medication regimen at this time. Will continue to follow.

## 2014-10-30 NOTE — Progress Notes (Signed)
S:    Patient arrives in good spirits. Presents for diabetes for follow-up.   Current diabetes medications include metformin 500 mg BID, Lantus 20 units daily, and Novolog 8 units TID with meals. Patient reports adherence with medications but she will sometimes change the dosing of the insulin: Novolog 8-10 units depending on her blood glucose and Lantus 20-21 units depending on her blood glucose. She reports taking the Lantus in the morning and stopped taking 24 units daily after she had a few episodes of hypoglycemia and titrated it down to 20 units daily. She also reports only taking Novolog BID since many times she skips lunch.    Patient reports hypoglycemic events. She reports that she can tell that she is becoming hypoglycemic because she feels nauseated, dizzy, gets blurry vision, and gets anxious.  Patient reported dietary habits: she eats two meals a day (breakfast and dinner) with snacks during late morning and afternoon.  Patient reported exercise habits: denies any exercise due to caregiving responsibilities for her husband.    Patient reports nocturia 3x per night. Patient reports neuropathy in her feet. Patient reports visual changes, and continues to have laser surgery.   Patient reports that she continues to be stressed but feels less stressed than she has in the past. She reports that her husband is still difficult to take care of but she has assistance from her adult son.    O:  . Lab Results  Component Value Date   HGBA1C 10.4 08/31/2014    7 day average: 250, 14 day average 253, 30 day average 234 Home fasting CBG: 200s 2 hour post-prandial/random CBG: 65 - 480 (3 readings < 80) Overnight randoms: 274, 252, 38 (5 minutes later was 213)   BP Readings from Last 3 Encounters:  10/30/14 133/76  10/06/14 174/78  08/31/14 115/70     A/P: Longstanding diabetes currently uncontrolled with metformin 500 mg BID, Lantus 20 units daily, and Novolog 8-10 units BID.    Reports hypoglycemic events and is able to verbalize appropriate hypoglycemia management plan.  Reports adherence with medication but will sometimes change the doses based on her blood glucose readings. Control is suboptimal due to inadequate insulin dosing and sporadic eating habits. Continued basal insulin Lantus (insulin glargine) at 20 units every morning (patient has been taking 20 units instead of the prescribed 24 units).  Increased dose of rapid insulin Novolog (insulin aspart) to 8-12 units with each meal or large snack. She feels comfortable adjusting her insulin based on her blood glucose. She will start using 12 units if her blood glucose is >250 mg/dL (patient agreed that at this level of blood glucose, she would like to try using a higher dose of Novolog. Will consider addition of GLP agonist at next visit if she continues to have a wide range of blood glucose readings. Patient to continue to monitor blood glucose multiple times a day and to bring meter to next visit.   Longstanding hypertension currently controlled on amlodipine, hydrochlorothiazide, clonidine, carvedilol, and lisinopril. Patient reports adherence to medications. No changes to medication regimen at this time. Will continue to follow.   Next A1C anticipated February 2016.  Written patient instructions provided.  Follow up in Pharmacist Clinic Visit in one month.   Total time in face to face counseling 30 minutes.  Patient seen with Randell Patient, PharmD Candidate and Nicoletta Ba, PharmD Resident.

## 2014-10-30 NOTE — Assessment & Plan Note (Signed)
Longstanding hypertension currently controlled on amlodipine, hydrochlorothiazide, clonidine, carvedilol, and lisinopril. Patient reports adherence to medications. No changes to medication regimen at this time. Will continue to follow.

## 2014-11-02 ENCOUNTER — Other Ambulatory Visit: Payer: Self-pay | Admitting: *Deleted

## 2014-11-02 DIAGNOSIS — E11311 Type 2 diabetes mellitus with unspecified diabetic retinopathy with macular edema: Secondary | ICD-10-CM | POA: Diagnosis not present

## 2014-11-02 DIAGNOSIS — I10 Essential (primary) hypertension: Secondary | ICD-10-CM

## 2014-11-02 MED ORDER — CLONIDINE HCL 0.3 MG PO TABS
ORAL_TABLET | ORAL | Status: DC
Start: 2014-11-02 — End: 2014-12-11

## 2014-11-02 NOTE — Telephone Encounter (Signed)
Refilled. Thanks  CGM

## 2014-11-25 ENCOUNTER — Other Ambulatory Visit: Payer: Self-pay | Admitting: Family Medicine

## 2014-11-25 DIAGNOSIS — E11349 Type 2 diabetes mellitus with severe nonproliferative diabetic retinopathy without macular edema: Secondary | ICD-10-CM | POA: Diagnosis not present

## 2014-11-25 NOTE — Telephone Encounter (Signed)
Pt has an appt to see Dr. Valentina Lucks on 12-03-14, do you want her to see you after that appt?  Lillian Ballester,CMA

## 2014-11-25 NOTE — Telephone Encounter (Signed)
Needs to be seen for follow up. Last follow up in 04/2014. Refilled for one month with one refill. Please call her to schedule an appointment. Thanks  CGM

## 2014-12-03 ENCOUNTER — Ambulatory Visit (INDEPENDENT_AMBULATORY_CARE_PROVIDER_SITE_OTHER): Payer: Medicare Other | Admitting: Pharmacist

## 2014-12-03 ENCOUNTER — Encounter: Payer: Self-pay | Admitting: Pharmacist

## 2014-12-03 VITALS — BP 138/76 | HR 78 | Wt 182.1 lb

## 2014-12-03 DIAGNOSIS — Z720 Tobacco use: Secondary | ICD-10-CM

## 2014-12-03 DIAGNOSIS — IMO0002 Reserved for concepts with insufficient information to code with codable children: Secondary | ICD-10-CM

## 2014-12-03 DIAGNOSIS — E1165 Type 2 diabetes mellitus with hyperglycemia: Secondary | ICD-10-CM | POA: Diagnosis not present

## 2014-12-03 DIAGNOSIS — F17200 Nicotine dependence, unspecified, uncomplicated: Secondary | ICD-10-CM

## 2014-12-03 NOTE — Assessment & Plan Note (Signed)
Chronic chew tobacco use of 1 box per week in the past.  Quit and has abstained from use since 10/02/2014.   Encouraged continued abstinence.  Appears committed to long-term quit at this time.

## 2014-12-03 NOTE — Progress Notes (Signed)
Patient ID: Wendy [name], female   DOB: [birth year], 74 y.o.   MRN: [id number] Reviewed: Agree with Dr. Graylin Shiver documentation and management.

## 2014-12-03 NOTE — Progress Notes (Signed)
S:    Patient arrives in fair spirits.  Presents for diabetes follow-up.     Patient reports adherence with medications. However is only taking Metformin 500mg  BID.    Patient reports 1 hypoglycemic events late in the PM after skipping dinner.  Patient reports fatigue and some mild dyspnea with exertion.   O:  . Lab Results  Component Value Date   HGBA1C 10.4 08/31/2014    Lower Extremity exam - NO edema.   Home fasting CBG: 224-240 reported (likely low 200s) 2 hour post-prandial/random CBG: > 200 routinely.   A/P: Diabetes longstanding remains higher than goal for CBG.   Reports 1 hypoglycemic event which was appropriately managemed.  Reports adherence with medication however she was taking only half of the metformin dose.  Control is suboptimal due to reported feelings of weakness and "feeling bad when readings are lower. Increased Metformin to two pills each morning AND each evening. Increased dose of basal insulin Lantus (insulin glargine) from 20 to 24 units. Continued rapid insulin Novolog (insulin aspart) at dose of 10 units with two meals per day (Does NOT take insulin with lunch).   Next A1C anticipated 1-2 months.   Written patient instructions provided.  Follow up in Pharmacist Clinic Visit following visit to her new PCP - Dr. Minda Ditto.   Total time in face to face counseling 30 minutes.  Patient seen with Harland German, PharmD Candidate and Elicia Lamp PharmD Resident.   Bone Health- Prevention:   Patient was interested in learning more related to calcium and vitamin D.   Given her lactose intolerance reported and minimal intake of milk or yogurt we agreed she will try to take 1 tablet per day of Calcium and Vitamin D.

## 2014-12-03 NOTE — Patient Instructions (Signed)
Increase your Metformin Take two pills each morning AND each evening.  Increase Lantus to 24 units each morning.   Try to take 1 tablet per day of your Calcium and Vitamin D.  Bring this next time to your visit.   Bring your meter to your next visit.  Next visit with Dr Minda Ditto

## 2014-12-03 NOTE — Assessment & Plan Note (Addendum)
Diabetes longstanding remains higher than goal for CBG.   Reports 1 hypoglycemic event which was appropriately managemed.  Reports adherence with medication however she was taking only half of the metformin dose.  Control is suboptimal due to reported feelings of weakness and "feeling bad when readings are lower. Increased Metformin to two pills each morning AND each evening. Increased dose of basal insulin Lantus (insulin glargine) from 20 to 24 units. Continued rapid insulin Novolog (insulin aspart) at dose of 10 units with two meals per day (Does NOT take insulin with lunch).   Next A1C anticipated 1-2 months.   Written patient instructions provided.  Follow up in Pharmacist Clinic Visit following visit to her new PCP - Dr. Minda Ditto.   Total time in face to face counseling 30 minutes.  Patient seen with Harland German, PharmD Candidate and Elicia Lamp PharmD Resident.

## 2014-12-07 DIAGNOSIS — E11311 Type 2 diabetes mellitus with unspecified diabetic retinopathy with macular edema: Secondary | ICD-10-CM | POA: Diagnosis not present

## 2014-12-07 DIAGNOSIS — E11349 Type 2 diabetes mellitus with severe nonproliferative diabetic retinopathy without macular edema: Secondary | ICD-10-CM | POA: Diagnosis not present

## 2014-12-10 ENCOUNTER — Other Ambulatory Visit: Payer: Self-pay | Admitting: Family Medicine

## 2014-12-10 NOTE — Telephone Encounter (Signed)
Prescription refilled last month.   CGM

## 2014-12-11 ENCOUNTER — Other Ambulatory Visit: Payer: Self-pay | Admitting: *Deleted

## 2014-12-11 DIAGNOSIS — I10 Essential (primary) hypertension: Secondary | ICD-10-CM

## 2014-12-11 MED ORDER — CLONIDINE HCL 0.3 MG PO TABS: ORAL_TABLET | ORAL | Status: DC

## 2014-12-23 ENCOUNTER — Other Ambulatory Visit: Payer: Self-pay | Admitting: Family Medicine

## 2014-12-23 DIAGNOSIS — E119 Type 2 diabetes mellitus without complications: Secondary | ICD-10-CM

## 2014-12-23 DIAGNOSIS — E1165 Type 2 diabetes mellitus with hyperglycemia: Secondary | ICD-10-CM

## 2014-12-23 DIAGNOSIS — IMO0002 Reserved for concepts with insufficient information to code with codable children: Secondary | ICD-10-CM

## 2014-12-24 NOTE — Telephone Encounter (Signed)
Will refill, but please call pt. To schedule an appointment for follow up in the next 2 months or so. Thanks  CGM.

## 2014-12-28 DIAGNOSIS — E11331 Type 2 diabetes mellitus with moderate nonproliferative diabetic retinopathy with macular edema: Secondary | ICD-10-CM | POA: Diagnosis not present

## 2014-12-28 NOTE — Telephone Encounter (Signed)
Pt has an appt on 01/22/2015

## 2015-01-08 ENCOUNTER — Ambulatory Visit (INDEPENDENT_AMBULATORY_CARE_PROVIDER_SITE_OTHER): Payer: Medicare Other | Admitting: Family Medicine

## 2015-01-08 VITALS — BP 139/83 | HR 77 | Temp 98.3°F | Ht 62.0 in | Wt 183.2 lb

## 2015-01-08 DIAGNOSIS — E785 Hyperlipidemia, unspecified: Secondary | ICD-10-CM | POA: Diagnosis present

## 2015-01-08 DIAGNOSIS — E1165 Type 2 diabetes mellitus with hyperglycemia: Secondary | ICD-10-CM

## 2015-01-08 DIAGNOSIS — IMO0002 Reserved for concepts with insufficient information to code with codable children: Secondary | ICD-10-CM

## 2015-01-08 DIAGNOSIS — I10 Essential (primary) hypertension: Secondary | ICD-10-CM

## 2015-01-08 LAB — POCT GLYCOSYLATED HEMOGLOBIN (HGB A1C): Hemoglobin A1C: 9.1

## 2015-01-08 MED ORDER — PRAVASTATIN SODIUM 80 MG PO TABS: ORAL_TABLET | ORAL | Status: DC

## 2015-01-08 NOTE — Patient Instructions (Addendum)
Thanks for coming in today!  Hypertension: We will not make any medication changes today.   Diabetes:  - Work on the dietary changes that we discussed.  - Continue to record your blood sugar - I will mail your Hemoglobin A1C to you.   Itching -  You may use over the counter hydrocortosone for your itching.   Thanks for letting us take care of you.   Sincerely,  Paula Compton, MD Family Medicine - PGY 1

## 2015-01-12 NOTE — Progress Notes (Signed)
Patient ID: Wendy [name], female   DOB: [birth year], 73 y.o.   MRN: [id number]   [facility] Aquilla Hacker, MD Phone: [phone]  Subjective:   # DMII - longstanding diabetes.  - known retinopathy, pt. To follow up with her ophthalmologist Dr. Posey Pronto this week.  - says that her CBG's have remained poorly controlled despite the recent increase in her lantus, though she says that they are somewhat better than before.  - Endorses compliance with her medications.  - Denies numbness, or pain in her feet.  - No numbness / weakness in her hands or fingers.  - No acute vision changes.   # Itching / Bump  - Pt. Complaining of recent find of new "itchy" bump on her back.  - She says that it was a bumpt that arrived just recently but was similar to others that she has had on her abdomen.  - She denies bleeding.  - No history of skin cancer - No history of sun bathing.    All relevant systems were reviewed and were negative unless otherwise noted in the HPI  Past Medical History Reviewed problem list.  Medications- reviewed and updated Current Outpatient Prescriptions  Medication Sig Dispense Refill  . amLODipine (NORVASC) 10 MG tablet TAKE 1 TABLET (10 MG TOTAL) BY MOUTH DAILY. 30 tablet 10  . aspirin 81 MG tablet Take 81 mg by mouth daily.      . B-D INS SYRINGE 0.5CC/31GX5/16 31G X 5/16" 0.5 ML MISC USE AS DIRECTED EVERY DAY 100 each prn  . Calcium Carbonate-Vit D-Min 600-400 MG-UNIT TABS Take 1 tablet by mouth daily.     . carvedilol (COREG) 25 MG tablet TAKE 1 TABLET BY MOUTH TWICE A DAY WITH A MEAL 60 tablet 1  . cloNIDine (CATAPRES) 0.3 MG tablet TAKE 1 TABLET (0.3 MG TOTAL) BY MOUTH 2 (TWO) TIMES DAILY. 60 tablet 3  . [facility] SENNA PLUS 8.6-50 MG per tablet TAKE 1 TABLET BY MOUTH AT BEDTIME. 30 tablet 3  . hydrochlorothiazide (HYDRODIURIL) 25 MG tablet TAKE 1 TABLET (25 MG TOTAL) BY MOUTH DAILY. 30 tablet 10  . insulin aspart (NOVOLOG) 100 UNIT/ML injection  Inject 8-12 Units into the skin 3 (three) times daily with meals.    . insulin glargine (LANTUS) 100 UNIT/ML injection Inject 20 Units into the skin every morning.    Marland Kitchen LANTUS 100 UNIT/ML injection INJECT 0.24 MLS (24 UNITS TOTAL) INTO THE SKIN EVERY MORNING. 10 mL 3  . lisinopril (PRINIVIL,ZESTRIL) 40 MG tablet TAKE 1 TABLET (40 MG TOTAL) BY MOUTH DAILY. 30 tablet 10  . metFORMIN (GLUCOPHAGE) 500 MG tablet TAKE 2 TABLETS BY MOUTH TWICE A DAY WITH FOOD (Patient taking differently: TAKE 1 TABLET BY MOUTH TWICE A DAY WITH FOOD) 120 tablet 3  . pravastatin (PRAVACHOL) 80 MG tablet TAKE 1 TABLET (80 MG TOTAL) BY MOUTH DAILY. 30 tablet 5   No current facility-administered medications for this visit.   Chief complaint-noted No additions to family history Social history- patient is a non smoker  Objective: BP 139/83 mmHg  Pulse 77  Temp(Src) 98.3 F (36.8 C) (Oral)  Ht 5\' 2"  (1.575 m)  Wt 183 lb 3.2 oz (83.099 kg)  BMI 33.50 kg/m2 Gen: NAD, alert, cooperative with exam HEENT: NCAT, EOMI, PERRL Neck: FROM, supple CV: RRR, good S1/S2, no murmur Resp: CTABL, no wheezes, non-labored Abd: SNTND, BS present, no guarding or organomegaly Ext: No edema, warm, normal tone, moves UE/LE spontaneously Diabetic Exam: Bilateral  feet without evidence of sensory deficit, skin intact, 5/5 motor in both lower extremities.  Neuro: Alert and oriented, No gross deficits Skin: Multiple seborrheic keratosis noted over her abdomen, and the "itchy bump" on her back appears to be a new seborrheic keratosis. No evidence of multiple colors, fairly uniform, raised / stuck on appearance, no evidence of bleeding or scabbing.   Assessment/Plan:   DMII - Pt. With recent change in her lantus to 24 units daily in pharmacy clinic. Per her CBG's are still over 200. She has not been writing them down.  - A1C today.  - Follow up in 3 months for further adjustment as needed.  - Write down CBG's  - Distribution of meals  discussed.  - Negative foot screening today.  - Following up with her ophthalmologist.  - last GFR 2008, No recent GFR for evaluation of kidney function.  - Urine / GFR at next office visit for evaluation of kidneys.   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 These have increased in numbers over the years per patient. She has never been evaluated by a dermatologist. No sun bathing. No recent red flag symptoms. No evidence of melanoma at this time.  - Advised pt. That these are likely benign SK's with low chance of malignant conversion.  - Will follow them up at next visit.  - Warning signs for melanoma discussed, and advised pt. That she is welcome to see a dermatologist if she would like. She declined at this time.

## 2015-01-14 DIAGNOSIS — E11339 Type 2 diabetes mellitus with moderate nonproliferative diabetic retinopathy without macular edema: Secondary | ICD-10-CM | POA: Diagnosis not present

## 2015-01-14 DIAGNOSIS — E11341 Type 2 diabetes mellitus with severe nonproliferative diabetic retinopathy with macular edema: Secondary | ICD-10-CM | POA: Diagnosis not present

## 2015-01-14 DIAGNOSIS — E11349 Type 2 diabetes mellitus with severe nonproliferative diabetic retinopathy without macular edema: Secondary | ICD-10-CM | POA: Diagnosis not present

## 2015-01-14 LAB — HM DIABETES EYE EXAM

## 2015-01-18 DIAGNOSIS — E11349 Type 2 diabetes mellitus with severe nonproliferative diabetic retinopathy without macular edema: Secondary | ICD-10-CM | POA: Diagnosis not present

## 2015-01-18 DIAGNOSIS — E11341 Type 2 diabetes mellitus with severe nonproliferative diabetic retinopathy with macular edema: Secondary | ICD-10-CM | POA: Diagnosis not present

## 2015-01-21 ENCOUNTER — Other Ambulatory Visit: Payer: Self-pay | Admitting: *Deleted

## 2015-01-21 MED ORDER — CARVEDILOL 25 MG PO TABS: ORAL_TABLET | ORAL | Status: DC

## 2015-01-22 ENCOUNTER — Ambulatory Visit: Payer: Medicare Other | Admitting: Family Medicine

## 2015-02-04 ENCOUNTER — Other Ambulatory Visit: Payer: Self-pay | Admitting: *Deleted

## 2015-02-04 MED ORDER — LISINOPRIL 40 MG PO TABS
ORAL_TABLET | ORAL | Status: DC
Start: 2015-02-04 — End: 2016-03-08

## 2015-02-08 ENCOUNTER — Other Ambulatory Visit: Payer: Self-pay | Admitting: *Deleted

## 2015-02-08 DIAGNOSIS — E119 Type 2 diabetes mellitus without complications: Secondary | ICD-10-CM

## 2015-02-08 MED ORDER — METFORMIN HCL 500 MG PO TABS: 500.0000 mg | ORAL_TABLET | Freq: Two times a day (BID) | ORAL | Status: DC

## 2015-03-03 DIAGNOSIS — E11349 Type 2 diabetes mellitus with severe nonproliferative diabetic retinopathy without macular edema: Secondary | ICD-10-CM | POA: Diagnosis not present

## 2015-03-03 DIAGNOSIS — E11341 Type 2 diabetes mellitus with severe nonproliferative diabetic retinopathy with macular edema: Secondary | ICD-10-CM | POA: Diagnosis not present

## 2015-03-16 ENCOUNTER — Other Ambulatory Visit: Payer: Self-pay | Admitting: *Deleted

## 2015-03-16 MED ORDER — HYDROCHLOROTHIAZIDE 25 MG PO TABS: ORAL_TABLET | ORAL | Status: DC

## 2015-03-24 DIAGNOSIS — E11341 Type 2 diabetes mellitus with severe nonproliferative diabetic retinopathy with macular edema: Secondary | ICD-10-CM | POA: Diagnosis not present

## 2015-04-08 ENCOUNTER — Encounter: Payer: Self-pay | Admitting: Family Medicine

## 2015-04-08 ENCOUNTER — Ambulatory Visit (INDEPENDENT_AMBULATORY_CARE_PROVIDER_SITE_OTHER): Payer: Medicare Other | Admitting: Family Medicine

## 2015-04-08 VITALS — BP 109/50 | HR 69 | Temp 98.5°F | Ht 62.0 in | Wt 186.0 lb

## 2015-04-08 DIAGNOSIS — E119 Type 2 diabetes mellitus without complications: Secondary | ICD-10-CM | POA: Diagnosis not present

## 2015-04-08 DIAGNOSIS — E1165 Type 2 diabetes mellitus with hyperglycemia: Secondary | ICD-10-CM

## 2015-04-08 DIAGNOSIS — IMO0002 Reserved for concepts with insufficient information to code with codable children: Secondary | ICD-10-CM

## 2015-04-08 DIAGNOSIS — I10 Essential (primary) hypertension: Secondary | ICD-10-CM

## 2015-04-08 LAB — POCT GLYCOSYLATED HEMOGLOBIN (HGB A1C): HEMOGLOBIN A1C: 9.5

## 2015-04-08 MED ORDER — METFORMIN HCL 500 MG PO TABS: 1000.0000 mg | ORAL_TABLET | Freq: Two times a day (BID) | ORAL | Status: DC

## 2015-04-08 MED ORDER — INSULIN GLARGINE 100 UNIT/ML ~~LOC~~ SOLN: SUBCUTANEOUS | Status: DC

## 2015-04-08 MED ORDER — INSULIN ASPART 100 UNIT/ML ~~LOC~~ SOLN: 8.0000 [IU] | Freq: Three times a day (TID) | SUBCUTANEOUS | Status: DC

## 2015-04-08 NOTE — Progress Notes (Signed)
Patient ID: Wendy [name], female   DOB: [birth year], 74 y.o.   MRN: [id number]   [facility] Aquilla Hacker, MD Phone: [phone]  Subjective:   # DMII Follow Up  - A1C is 9.5 today up from 9.1.  - She has had glucose ranging > 200 - 250 most days in the morning. Some in the mid - low 100's in the afternoons, and a few glucose measurements > 350 in the morning.  - No episodes of hypoglycemia.  - Taking 24 u of lantus in the am, and 10 u of novolog in the morning and afternoon.  - No vision changes, foot numbness or tingling.  - She has some polydypsia but no polyuria.  - She is trying to work on her diet.  - She has been under a lot of stress since her husband has been sick recently.  - Additionally, she has a hard time getting her medicine due to financial constraints and her copay being too high.   # HTN  - At goal today.  - No Chest pain, palpitations, SOB, LE edema.  - No angina with exertion.  - No vision changes.  - Taking her medication daily.  - Working on diet / lifestyle.   All relevant systems were reviewed and were negative unless otherwise noted in the HPI  Past Medical History Reviewed problem list.  Medications- reviewed and updated Current Outpatient Prescriptions  Medication Sig Dispense Refill  . amLODipine (NORVASC) 10 MG tablet TAKE 1 TABLET (10 MG TOTAL) BY MOUTH DAILY. 30 tablet 10  . aspirin 81 MG tablet Take 81 mg by mouth daily.      . B-D INS SYRINGE 0.5CC/31GX5/16 31G X 5/16" 0.5 ML MISC USE AS DIRECTED EVERY DAY 100 each prn  . Calcium Carbonate-Vit D-Min 600-400 MG-UNIT TABS Take 1 tablet by mouth daily.     . carvedilol (COREG) 25 MG tablet TAKE 1 TABLET BY MOUTH TWICE A DAY WITH A MEAL 30 tablet 5  . cloNIDine (CATAPRES) 0.3 MG tablet TAKE 1 TABLET (0.3 MG TOTAL) BY MOUTH 2 (TWO) TIMES DAILY. 60 tablet 3  . [facility] SENNA PLUS 8.6-50 MG per tablet TAKE 1 TABLET BY MOUTH AT BEDTIME. 30 tablet 3  . hydrochlorothiazide  (HYDRODIURIL) 25 MG tablet TAKE 1 TABLET (25 MG TOTAL) BY MOUTH DAILY. 30 tablet 10  . insulin aspart (NOVOLOG) 100 UNIT/ML injection Inject 8-12 Units into the skin 3 (three) times daily with meals. 10 mL 0  . insulin glargine (LANTUS) 100 UNIT/ML injection INJECT 0.24 MLS (24 UNITS TOTAL) INTO THE SKIN EVERY MORNING. 10 mL 3  . lisinopril (PRINIVIL,ZESTRIL) 40 MG tablet TAKE 1 TABLET (40 MG TOTAL) BY MOUTH DAILY. 30 tablet 10  . metFORMIN (GLUCOPHAGE) 500 MG tablet Take 2 tablets (1,000 mg total) by mouth 2 (two) times daily with a meal. 120 tablet 6  . pravastatin (PRAVACHOL) 80 MG tablet TAKE 1 TABLET (80 MG TOTAL) BY MOUTH DAILY. 30 tablet 5   No current facility-administered medications for this visit.   Chief complaint-noted No additions to family history Social history- patient is a non smoker  Objective: BP 109/50 mmHg  Pulse 69  Temp(Src) 98.5 F (36.9 C) (Oral)  Ht 5\' 2"  (1.575 m)  Wt 186 lb (84.369 kg)  BMI 34.01 kg/m2 Gen: NAD, alert, cooperative with exam HEENT: NCAT, EOMI, PERRL Neck: FROM, supple CV: RRR, good S1/S2, no murmur Resp: CTABL, no wheezes, non-labored Abd: SNTND, BS present, no guarding  or organomegaly Ext: No edema, warm, normal tone, moves UE/LE spontaneously Neuro: Alert and oriented, No gross deficits Skin: no rashes no lesions  Assessment/Plan: See problem based a/p

## 2015-04-08 NOTE — Assessment & Plan Note (Signed)
Current A1c is 9.5. She is continuing to improve which is excellent. She has a hard time getting meds due to finances. Given sample refills for lantus and novolog today. Adjusted lantus to 26 u qam. Metformin 1000mg  BID. Will follow up in 3 months. She is to continue the novolog 10u at 10 am and 4pm.

## 2015-04-08 NOTE — Progress Notes (Signed)
Patient is due for a screening mammogram.  Information on imaging center given to patient to call and schedule mammogram appointment.

## 2015-04-08 NOTE — Addendum Note (Signed)
Addended by: Valerie Roys on: 04/08/2015 02:54 PM   Modules accepted: Orders

## 2015-04-08 NOTE — Addendum Note (Signed)
Addended by: Valerie Roys on: 04/08/2015 03:23 PM   Modules accepted: Orders

## 2015-04-08 NOTE — Assessment & Plan Note (Signed)
Pt. Is compliant with meds and at goal today. Continue current regimen. No issues.

## 2015-04-08 NOTE — Patient Instructions (Addendum)
Thanks for coming in today!  You will take 26 units of lantus each morning.   You will take your Novolog at 10 units in the morning at 10 am and in the afternoon at 4 pm.  If your blood sugar is below 150 then don't take the novolog.   You will take 1000 mg (2 pills) in the morning and at night.   Keep taking your blood pressure as prescribed.   Thanks for letting us take care of you!   Sincerely,  Paula Compton, MD Family Medicine - PGY 2

## 2015-04-19 ENCOUNTER — Encounter: Payer: Self-pay | Admitting: Family Medicine

## 2015-05-19 DIAGNOSIS — Z961 Presence of intraocular lens: Secondary | ICD-10-CM | POA: Diagnosis not present

## 2015-05-19 DIAGNOSIS — E11351 Type 2 diabetes mellitus with proliferative diabetic retinopathy with macular edema: Secondary | ICD-10-CM | POA: Diagnosis not present

## 2015-05-24 ENCOUNTER — Other Ambulatory Visit: Payer: Self-pay | Admitting: *Deleted

## 2015-05-24 DIAGNOSIS — E1165 Type 2 diabetes mellitus with hyperglycemia: Secondary | ICD-10-CM

## 2015-05-24 DIAGNOSIS — IMO0002 Reserved for concepts with insufficient information to code with codable children: Secondary | ICD-10-CM

## 2015-05-24 MED ORDER — INSULIN GLARGINE 100 UNIT/ML ~~LOC~~ SOLN: SUBCUTANEOUS | Status: DC

## 2015-06-14 ENCOUNTER — Other Ambulatory Visit: Payer: Self-pay | Admitting: *Deleted

## 2015-06-14 ENCOUNTER — Telehealth: Payer: Self-pay | Admitting: Family Medicine

## 2015-06-14 DIAGNOSIS — IMO0002 Reserved for concepts with insufficient information to code with codable children: Secondary | ICD-10-CM

## 2015-06-14 DIAGNOSIS — E1165 Type 2 diabetes mellitus with hyperglycemia: Secondary | ICD-10-CM

## 2015-06-14 NOTE — Telephone Encounter (Signed)
Would like to know if we have any Lantus samples to give. Sadie Reynolds, ASA

## 2015-06-14 NOTE — Telephone Encounter (Signed)
Return call to patient regarding samples of Lantus.  The office of out of samples at this time.  Patient also mentioned that her blood sugars has been running low. Lowest has been in the 40s.  This has occur three times per patient.  Advised patient that she should be seen for follow up visit.  Appt scheduled for 06/16/15 at 10:45 AM with Dr. Kennon Rounds.  Will forward to PCP.  Derl Barrow, RN

## 2015-06-15 MED ORDER — INSULIN GLARGINE 100 UNIT/ML ~~LOC~~ SOLN: SUBCUTANEOUS | Status: DC

## 2015-06-16 ENCOUNTER — Ambulatory Visit (INDEPENDENT_AMBULATORY_CARE_PROVIDER_SITE_OTHER): Payer: Medicare Other | Admitting: Family Medicine

## 2015-06-16 ENCOUNTER — Encounter: Payer: Self-pay | Admitting: Family Medicine

## 2015-06-16 VITALS — BP 122/68 | HR 89 | Temp 98.1°F | Ht 62.0 in | Wt 189.0 lb

## 2015-06-16 DIAGNOSIS — Z23 Encounter for immunization: Secondary | ICD-10-CM | POA: Diagnosis not present

## 2015-06-16 DIAGNOSIS — I10 Essential (primary) hypertension: Secondary | ICD-10-CM | POA: Diagnosis not present

## 2015-06-16 DIAGNOSIS — E162 Hypoglycemia, unspecified: Secondary | ICD-10-CM | POA: Diagnosis not present

## 2015-06-16 DIAGNOSIS — IMO0002 Reserved for concepts with insufficient information to code with codable children: Secondary | ICD-10-CM

## 2015-06-16 DIAGNOSIS — E1165 Type 2 diabetes mellitus with hyperglycemia: Secondary | ICD-10-CM | POA: Diagnosis not present

## 2015-06-16 LAB — GLUCOSE, CAPILLARY: Glucose-Capillary: 168 mg/dL — ABNORMAL HIGH (ref 65–99)

## 2015-06-16 NOTE — Patient Instructions (Signed)

## 2015-06-16 NOTE — Progress Notes (Signed)
    Subjective:    Patient ID: Wendy [name] is a 74 y.o. female presenting with Hypoglycemia  on 06/16/2015  HPI: Reports that over the last few days she has noticed a Lo reading but usually BS is high.  I have reviewed her meter. She was without symptoms, so I suspect user error. BS range from 108-402. Many are in the 300 range.  She already has diabetic retinopathy.  Review of Systems  Constitutional: Negative for fever and chills.  Respiratory: Negative for shortness of breath.   Cardiovascular: Negative for chest pain.  Gastrointestinal: Negative for nausea, vomiting and abdominal pain.  Genitourinary: Negative for dysuria.  Skin: Negative for rash.  Neurological:       Vision changes      Objective:    BP 155/74 mmHg  Pulse 89  Temp(Src) 98.1 F (36.7 C) (Oral)  Ht 5\' 2"  (1.575 m)  Wt 189 lb (85.73 kg)  BMI 34.56 kg/m2 Physical Exam  Constitutional: She is oriented to person, place, and time. She appears well-developed and well-nourished. No distress.  HENT:  Head: Normocephalic and atraumatic.  Eyes: No scleral icterus.  Neck: Neck supple.  Cardiovascular: Normal rate.   Pulmonary/Chest: Effort normal.  Abdominal: Soft.  Neurological: She is alert and oriented to person, place, and time.  Skin: Skin is warm and dry.  Psychiatric: She has a normal mood and affect.        Assessment & Plan:   Problem List Items Addressed This Visit      Unprioritized   DM (diabetes mellitus), type 2, uncontrolled    Must be cognizant of CBG's.  Asked to keep diary of foods related to BS which ones cause it to go up.  Must stop eating bread and other carbs with high glycemic index.  Advised protein and green vegetables. Continue her meds. Importance of good glycemic control to keep her vision emphasized.      HYPERTENSION, BENIGN SYSTEMIC    BP is not at goal, advised to continue meds--already maxed oout on 4 drugs and DASH diet.       Other Visit Diagnoses    Hypoglycemia    -  Primary    Relevant Orders    Glucose (CBG)    Encounter for immunization           Total face-to-face time with patient: 15 minutes. Over 50% of encounter was spent on counseling and coordination of care. Return in about 3 months (around 09/15/2015).  Valmore Arabie S 06/16/2015 11:13 AM

## 2015-06-17 NOTE — Assessment & Plan Note (Signed)
BP is not at goal, advised to continue meds--already maxed oout on 4 drugs and DASH diet.

## 2015-06-17 NOTE — Assessment & Plan Note (Signed)
Must be cognizant of CBG's.  Asked to keep diary of foods related to BS which ones cause it to go up.  Must stop eating bread and other carbs with high glycemic index.  Advised protein and green vegetables. Continue her meds. Importance of good glycemic control to keep her vision emphasized.

## 2015-06-30 DIAGNOSIS — Z961 Presence of intraocular lens: Secondary | ICD-10-CM | POA: Diagnosis not present

## 2015-06-30 DIAGNOSIS — E11351 Type 2 diabetes mellitus with proliferative diabetic retinopathy with macular edema: Secondary | ICD-10-CM | POA: Diagnosis not present

## 2015-07-01 ENCOUNTER — Other Ambulatory Visit: Payer: Self-pay | Admitting: Family Medicine

## 2015-07-01 MED ORDER — CARVEDILOL 25 MG PO TABS: ORAL_TABLET | ORAL | Status: DC

## 2015-07-01 NOTE — Telephone Encounter (Signed)
Needs refill on carvedilol (COREG) 25 MG tablet  [facility] [address]. States that her pharmacy told her that they called for her "3 times already". Thank you, Fonda Kinder, ASA

## 2015-07-12 ENCOUNTER — Encounter: Payer: Self-pay | Admitting: Internal Medicine

## 2015-07-12 ENCOUNTER — Ambulatory Visit (INDEPENDENT_AMBULATORY_CARE_PROVIDER_SITE_OTHER): Payer: Medicare Other | Admitting: Internal Medicine

## 2015-07-12 VITALS — BP 130/64 | HR 78 | Temp 98.2°F | Resp 16 | Ht 62.0 in | Wt 193.0 lb

## 2015-07-12 DIAGNOSIS — I10 Essential (primary) hypertension: Secondary | ICD-10-CM | POA: Diagnosis not present

## 2015-07-12 DIAGNOSIS — Z794 Long term (current) use of insulin: Secondary | ICD-10-CM

## 2015-07-12 DIAGNOSIS — E669 Obesity, unspecified: Secondary | ICD-10-CM

## 2015-07-12 DIAGNOSIS — E785 Hyperlipidemia, unspecified: Secondary | ICD-10-CM | POA: Diagnosis not present

## 2015-07-12 DIAGNOSIS — E113413 Type 2 diabetes mellitus with severe nonproliferative diabetic retinopathy with macular edema, bilateral: Secondary | ICD-10-CM

## 2015-07-12 DIAGNOSIS — IMO0002 Reserved for concepts with insufficient information to code with codable children: Secondary | ICD-10-CM

## 2015-07-12 DIAGNOSIS — E113419 Type 2 diabetes mellitus with severe nonproliferative diabetic retinopathy with macular edema, unspecified eye: Secondary | ICD-10-CM

## 2015-07-12 DIAGNOSIS — Z23 Encounter for immunization: Secondary | ICD-10-CM

## 2015-07-12 DIAGNOSIS — E1165 Type 2 diabetes mellitus with hyperglycemia: Secondary | ICD-10-CM

## 2015-07-12 MED ORDER — PRAVASTATIN SODIUM 80 MG PO TABS: ORAL_TABLET | ORAL | Status: DC

## 2015-07-12 NOTE — Progress Notes (Signed)
Subjective:    Patient ID: Wendy [name], female    DOB: 21-Apr-1941, 74 y.o.   MRN: [id number]  HPI  She is here to establish with a new pcp.  She is concerned about   Diabetes: She is taking her medication daily as prescribed.  She takes lantus 24 units daily.  She takes 10 units of novolog before breakfast and dinner.  She does not take novolog with lunch.  She is not always compliant with a diabetic diet. She is active, but not exercising regularly. She monitors her sugars and they have been running 240 in the morning, 143 at lunch and it has been similar in the past week.  She had hypoglycemia 2 months ago - it was 40.  She is up-to-date with an ophthalmology examination.   Hypertension: She is taking her medication daily. She is compliant with a low sodium diet.  She denies chest pain, palpitations, edema, shortness of breath and regular headaches. She is exercising regularly.  She does not monitor her blood pressure at home.    Hyperlipidemia: She is taking her medication daily. She is compliant with a low fat/cholesterol diet. She is not exercising regularly. She denies myalgias.     Medications and allergies reviewed with patient and updated if appropriate.  Patient Active Problem List   Diagnosis Date Noted  . Seasonal allergies 12/01/2013  . Diabetic retinopathy associated with type 2 diabetes mellitus ([place]) 08/22/2011  . Chronic diastolic heart failure ([place]) 07/06/2011  . TOBACCO USE 04/13/2008  . DM (diabetes mellitus), type 2, uncontrolled ([place]) 11/29/2006  . HYPERLIPIDEMIA 11/29/2006  . OBESITY, BMI 30-35 11/29/2006  . HYPERTENSION, BENIGN SYSTEMIC 11/29/2006    Past Medical History  Diagnosis Date  . BREAST MASS, LEFT 04/13/2008    Per note 04/13/2008: U/S guided core needle biopsy of left breast on 02/21/08. Benign per pathologist read but was referred to Dr. Brantley Stage for surgical excision and patient reports this was excised.    . FRACTURE, TOE, RIGHT 10/08/2007  .  SYSTOLIC MURMUR [id number]  . Heartburn 11/29/2006    Qualifier: Diagnosis of  By: Herma Ard    . ECZEMA, ATOPIC DERMATITIS 11/29/2006    Qualifier: Diagnosis of  By: Herma Ard    . Colon polyps   . DM (diabetes mellitus) ([place])   . HLD (hyperlipidemia)   . HTN (hypertension)     Past Surgical History  Procedure Laterality Date  . Breast lumpectomy Left     Social History   Social History  . Marital Status: Married    Spouse Name: N/A  . Number of Children: 4  . Years of Education: N/A   Occupational History  . DAY CARE    Social History Main Topics  . Smoking status: Never Smoker   . Smokeless tobacco: Former Systems developer    Types: Snuff    Quit date: 10/02/2014     Comment: Quit 10/02/2014  previous 1 box per week of snuff   . Alcohol Use: No  . Drug Use: No  . Sexual Activity: Not Asked   Other Topics Concern  . None   Social History Narrative    Review of Systems  Constitutional: Negative for fever, chills, fatigue and unexpected weight change.  Respiratory: Negative for cough, shortness of breath and wheezing.   Cardiovascular: Positive for leg swelling (occasionally). Negative for chest pain and palpitations.  Gastrointestinal: Positive for nausea (occasional). Negative for abdominal pain.  Endocrine: Negative for polydipsia and polyuria.  Neurological:  Positive for numbness (occasional numbness/tingling in hands and feet).  Psychiatric/Behavioral: Negative for dysphoric mood. The patient is not nervous/anxious.        Objective:   Filed Vitals:   07/12/15 1357  BP: 130/64  Pulse: 78  Temp: 98.2 F (36.8 C)  Resp: 16   Filed Weights   07/12/15 1357  Weight: 193 lb (87.544 kg)   Body mass index is 35.29 kg/(m^2).   Physical Exam  Constitutional: She is oriented to person, place, and time. She appears well-developed and well-nourished. No distress.  HENT:  Head: Normocephalic and atraumatic.  Right Ear: External ear normal.  Left  Ear: External ear normal.  Mouth/Throat: Oropharynx is clear and moist.  Eyes: Conjunctivae are normal.  Neck: Normal range of motion. Neck supple. No tracheal deviation present. No thyromegaly present.  No carotid bruit  Cardiovascular: Normal rate, regular rhythm and normal heart sounds.   No murmur heard. Pulmonary/Chest: Effort normal and breath sounds normal. No respiratory distress. She has no wheezes.  Abdominal: Soft. She exhibits no distension. There is no tenderness.  Musculoskeletal: She exhibits no edema.  Lymphadenopathy:    She has no cervical adenopathy.  Neurological: She is alert and oriented to person, place, and time.  Skin: Skin is warm and dry. No rash noted.  Psychiatric: She has a normal mood and affect. Her behavior is normal.        Assessment & Plan:    prevnar today  See Problem List.  She will have blood work done today  Follow up in 3 months

## 2015-07-12 NOTE — Assessment & Plan Note (Signed)
Increase exercise and improve diet/decrease portions

## 2015-07-12 NOTE — Progress Notes (Signed)
Pre visit review using our clinic review tool, if applicable. No additional management support is needed unless otherwise documented below in the visit note. 

## 2015-07-12 NOTE — Assessment & Plan Note (Addendum)
Last a1c elevated, will recheck a1c tomorrow Stressed lifestyle changes - improve diet and increase exercise; weight loss Stop nighttime snack before bedtime - her sugars are better in am when she does not have it Has seen nutrition in the past Continue current medications - may need to adjust Follow up in 3 months

## 2015-07-12 NOTE — Assessment & Plan Note (Signed)
Severe retinopathy with macular edema [facility]s [facility]

## 2015-07-12 NOTE — Assessment & Plan Note (Signed)
On pravastatin and tolerating it Will check lipid panel Increase exercise, improve diet and work on weight loss Has not tolerated lipitor in past

## 2015-07-12 NOTE — Assessment & Plan Note (Addendum)
bp at goal of less than 140/90 She is compliant with her medication Low sodium diet Work on increasing activity Work on [employer] loss Continue current medications

## 2015-07-12 NOTE — Patient Instructions (Signed)
We have reviewed your prior records including labs and tests today.  Test(s) ordered today. Your results will be released to [name] (or called to you) after review, usually within 72hours after test completion. If any changes need to be made, you will be notified at that same time.  All other Health Maintenance issues reviewed.   All recommended immunizations and age-appropriate screenings are up-to-date.  Pneumonia vaccine administered today.   Medications reviewed and updated.  no changes recommended at this time.  Your prescription(s) have been submitted to your pharmacy. Please take as directed and contact our office if you believe you are having problem(s) with the medication(s).  Work on [employer], increasing your exercise and losing weight.  Please schedule followup in 3 months  Health Maintenance, Female Adopting a healthy lifestyle and getting preventive care can go a long way to promote health and wellness. Talk with your health care provider about what schedule of regular examinations is right for you. This is a good chance for you to check in with your provider about disease prevention and staying healthy. In between checkups, there are plenty of things you can do on your own. Experts have done a lot of research about which lifestyle changes and preventive measures are most likely to keep you healthy. Ask your health care provider for more information. WEIGHT AND DIET  Eat a healthy diet  Be sure to include plenty of vegetables, fruits, low-fat dairy products, and lean protein.  Do not eat a lot of foods high in solid fats, added sugars, or salt.  Get regular exercise. This is one of the most important things you can do for your health.  Most adults should exercise for at least 150 minutes each week. The exercise should increase your heart rate and make you sweat (moderate-intensity exercise).  Most adults should also do strengthening exercises at least twice a  week. This is in addition to the moderate-intensity exercise.  Maintain a healthy weight  Body mass index (BMI) is a measurement that can be used to identify possible weight problems. It estimates body fat based on height and weight. Your health care provider can help determine your BMI and help you achieve or maintain a healthy weight.  For females 45 years of age and older:   A BMI below 18.5 is considered underweight.  A BMI of 18.5 to 24.9 is normal.  A BMI of 25 to 29.9 is considered overweight.  A BMI of 30 and above is considered obese.  Watch levels of cholesterol and blood lipids  You should start having your blood tested for lipids and cholesterol at 74 years of age, then have this test every 5 years.  You may need to have your cholesterol levels checked more often if:  Your lipid or cholesterol levels are high.  You are older than 74 years of age.  You are at high risk for heart disease.  CANCER SCREENING   Lung Cancer  Lung cancer screening is recommended for adults 89-45 years old who are at high risk for lung cancer because of a history of smoking.  A yearly low-dose CT scan of the lungs is recommended for people who:  Currently smoke.  Have quit within the past 15 years.  Have at least a 30-pack-year history of smoking. A pack year is smoking an average of one pack of cigarettes a day for 1 year.  Yearly screening should continue until it has been 15 years since you quit.  Yearly  screening should stop if you develop a health problem that would prevent you from having lung cancer treatment.  Breast Cancer  Practice breast self-awareness. This means understanding how your breasts normally appear and feel.  It also means doing regular breast self-exams. Let your health care provider know about any changes, no matter how small.  If you are in your 20s or 30s, you should have a clinical breast exam (CBE) by a health care provider every 1-3 years as part  of a regular health exam.  If you are 23 or older, have a CBE every year. Also consider having a breast X-ray (mammogram) every year.  If you have a family history of breast cancer, talk to your health care provider about genetic screening.  If you are at high risk for breast cancer, talk to your health care provider about having an MRI and a mammogram every year.  Breast cancer gene (BRCA) assessment is recommended for women who have family members with BRCA-related cancers. BRCA-related cancers include:  Breast.  Ovarian.  Tubal.  Peritoneal cancers.  Results of the assessment will determine the need for genetic counseling and BRCA1 and BRCA2 testing. Cervical Cancer Your health care provider may recommend that you be screened regularly for cancer of the pelvic organs (ovaries, uterus, and vagina). This screening involves a pelvic examination, including checking for microscopic changes to the surface of your cervix (Pap test). You may be encouraged to have this screening done every 3 years, beginning at age 45.  For women ages 57-65, health care providers may recommend pelvic exams and Pap testing every 3 years, or they may recommend the Pap and pelvic exam, combined with testing for human papilloma virus (HPV), every 5 years. Some types of HPV increase your risk of cervical cancer. Testing for HPV may also be done on women of any age with unclear Pap test results.  Other health care providers may not recommend any screening for nonpregnant women who are considered low risk for pelvic cancer and who do not have symptoms. Ask your health care provider if a screening pelvic exam is right for you.  If you have had past treatment for cervical cancer or a condition that could lead to cancer, you need Pap tests and screening for cancer for at least 20 years after your treatment. If Pap tests have been discontinued, your risk factors (such as having a new sexual partner) need to be reassessed to  determine if screening should resume. Some women have medical problems that increase the chance of getting cervical cancer. In these cases, your health care provider may recommend more frequent screening and Pap tests. Colorectal Cancer  This type of cancer can be detected and often prevented.  Routine colorectal cancer screening usually begins at 74 years of age and continues through 74 years of age.  Your health care provider may recommend screening at an earlier age if you have risk factors for colon cancer.  Your health care provider may also recommend using home test kits to check for hidden blood in the stool.  A small camera at the end of a tube can be used to examine your colon directly (sigmoidoscopy or colonoscopy). This is done to check for the earliest forms of colorectal cancer.  Routine screening usually begins at age 59.  Direct examination of the colon should be repeated every 5-10 years through 74 years of age. However, you may need to be screened more often if early forms of precancerous polyps or small  growths are found. Skin Cancer  Check your skin from head to toe regularly.  Tell your health care provider about any new moles or changes in moles, especially if there is a change in a mole's shape or color.  Also tell your health care provider if you have a mole that is larger than the size of a pencil eraser.  Always use sunscreen. Apply sunscreen liberally and repeatedly throughout the day.  Protect yourself by wearing long sleeves, pants, a wide-brimmed hat, and sunglasses whenever you are outside. HEART DISEASE, DIABETES, AND HIGH BLOOD PRESSURE   High blood pressure causes heart disease and increases the risk of stroke. High blood pressure is more likely to develop in:  People who have blood pressure in the high end of the normal range (130-139/85-89 mm Hg).  People who are overweight or obese.  People who are African American.  If you are 18-39 years of  age, have your blood pressure checked every 3-5 years. If you are 6 years of age or older, have your blood pressure checked every year. You should have your blood pressure measured twice--once when you are at a hospital or clinic, and once when you are not at a hospital or clinic. Record the average of the two measurements. To check your blood pressure when you are not at a hospital or clinic, you can use:  An automated blood pressure machine at a pharmacy.  A home blood pressure monitor.  If you are between 62 years and 24 years old, ask your health care provider if you should take aspirin to prevent strokes.  Have regular diabetes screenings. This involves taking a blood sample to check your fasting blood sugar level.  If you are at a normal weight and have a low risk for diabetes, have this test once every three years after 74 years of age.  If you are overweight and have a high risk for diabetes, consider being tested at a younger age or more often. PREVENTING INFECTION  Hepatitis B  If you have a higher risk for hepatitis B, you should be screened for this virus. You are considered at high risk for hepatitis B if:  You were born in a country where hepatitis B is common. Ask your health care provider which countries are considered high risk.  Your parents were born in a high-risk country, and you have not been immunized against hepatitis B (hepatitis B vaccine).  You have HIV or AIDS.  You use needles to inject street drugs.  You live with someone who has hepatitis B.  You have had sex with someone who has hepatitis B.  You get hemodialysis treatment.  You take certain medicines for conditions, including cancer, organ transplantation, and autoimmune conditions. Hepatitis C  Blood testing is recommended for:  Everyone born from [birth year] through 1965.  Anyone with known risk factors for hepatitis C. Sexually transmitted infections (STIs)  You should be screened for sexually  transmitted infections (STIs) including gonorrhea and chlamydia if:  You are sexually active and are younger than 74 years of age.  You are older than 74 years of age and your health care provider tells you that you are at risk for this type of infection.  Your sexual activity has changed since you were last screened and you are at an increased risk for chlamydia or gonorrhea. Ask your health care provider if you are at risk.  If you do not have HIV, but are at risk, it may be recommended  that you take a prescription medicine daily to prevent HIV infection. This is called pre-exposure prophylaxis (PrEP). You are considered at risk if:  You are sexually active and do not regularly use condoms or know the HIV status of your partner(s).  You take drugs by injection.  You are sexually active with a partner who has HIV. Talk with your health care provider about whether you are at high risk of being infected with HIV. If you choose to begin PrEP, you should first be tested for HIV. You should then be tested every 3 months for as long as you are taking PrEP.  PREGNANCY   If you are premenopausal and you may become pregnant, ask your health care provider about preconception counseling.  If you may become pregnant, take 400 to 800 micrograms (mcg) of folic acid every day.  If you want to prevent pregnancy, talk to your health care provider about birth control (contraception). OSTEOPOROSIS AND MENOPAUSE   Osteoporosis is a disease in which the bones lose minerals and strength with aging. This can result in serious bone fractures. Your risk for osteoporosis can be identified using a bone density scan.  If you are 47 years of age or older, or if you are at risk for osteoporosis and fractures, ask your health care provider if you should be screened.  Ask your health care provider whether you should take a calcium or vitamin D supplement to lower your risk for osteoporosis.  Menopause may have  certain physical symptoms and risks.  Hormone replacement therapy may reduce some of these symptoms and risks. Talk to your health care provider about whether hormone replacement therapy is right for you.  HOME CARE INSTRUCTIONS   Schedule regular health, dental, and eye exams.  Stay current with your immunizations.   Do not use any tobacco products including cigarettes, chewing tobacco, or electronic cigarettes.  If you are pregnant, do not drink alcohol.  If you are breastfeeding, limit how much and how often you drink alcohol.  Limit alcohol intake to no more than 1 drink per day for nonpregnant women. One drink equals 12 ounces of beer, 5 ounces of wine, or 1 ounces of hard liquor.  Do not use street drugs.  Do not share needles.  Ask your health care provider for help if you need support or information about quitting drugs.  Tell your health care provider if you often feel depressed.  Tell your health care provider if you have ever been abused or do not feel safe at home.   This information is not intended to replace advice given to you by your health care provider. Make sure you discuss any questions you have with your health care provider.   Document Released: 04/03/2011 Document Revised: 10/09/2014 Document Reviewed: 08/20/2013 Elsevier Interactive Patient Education [employer].

## 2015-07-13 ENCOUNTER — Other Ambulatory Visit (INDEPENDENT_AMBULATORY_CARE_PROVIDER_SITE_OTHER): Payer: Medicare Other

## 2015-07-13 DIAGNOSIS — E113413 Type 2 diabetes mellitus with severe nonproliferative diabetic retinopathy with macular edema, bilateral: Secondary | ICD-10-CM

## 2015-07-13 DIAGNOSIS — E113419 Type 2 diabetes mellitus with severe nonproliferative diabetic retinopathy with macular edema, unspecified eye: Secondary | ICD-10-CM

## 2015-07-13 DIAGNOSIS — I10 Essential (primary) hypertension: Secondary | ICD-10-CM | POA: Diagnosis not present

## 2015-07-13 DIAGNOSIS — E785 Hyperlipidemia, unspecified: Secondary | ICD-10-CM | POA: Diagnosis not present

## 2015-07-13 DIAGNOSIS — Z794 Long term (current) use of insulin: Secondary | ICD-10-CM

## 2015-07-13 LAB — COMPREHENSIVE METABOLIC PANEL
ALT: 5 U/L (ref 0–35)
AST: 6 U/L (ref 0–37)
Albumin: 3.8 g/dL (ref 3.5–5.2)
Alkaline Phosphatase: 71 U/L (ref 39–117)
BILIRUBIN TOTAL: 0.3 mg/dL (ref 0.2–1.2)
BUN: 23 mg/dL (ref 6–23)
CHLORIDE: 102 meq/L (ref 96–112)
CO2: 28 meq/L (ref 19–32)
Calcium: 9.6 mg/dL (ref 8.4–10.5)
Creatinine, Ser: 1.17 mg/dL (ref 0.40–1.20)
GFR: 58.15 mL/min — AB (ref >60.00)
GLUCOSE: 153 mg/dL — AB (ref 70–99)
Potassium: 4 mEq/L (ref 3.5–5.1)
Sodium: 140 mEq/L (ref 135–145)
Total Protein: 7.2 g/dL (ref 6.0–8.3)

## 2015-07-13 LAB — LIPID PANEL
CHOL/HDL RATIO: 4
Cholesterol: 256 mg/dL — ABNORMAL HIGH (ref 0–200)
HDL: 60.1 mg/dL (ref >39.00)
LDL CALC: 167 mg/dL — AB (ref 0–99)
NonHDL: 195.71
TRIGLYCERIDES: 142 mg/dL (ref 0.0–149.0)
VLDL: 28.4 mg/dL (ref 0.0–40.0)

## 2015-07-13 LAB — CBC WITH DIFFERENTIAL/PLATELET
BASOS PCT: 0.3 % (ref 0.0–3.0)
Basophils Absolute: 0 10*3/uL (ref 0.0–0.1)
EOS ABS: 0.2 10*3/uL (ref 0.0–0.7)
EOS PCT: 2.4 % (ref 0.0–5.0)
HEMATOCRIT: 35.1 % — AB (ref 36.0–46.0)
HEMOGLOBIN: 11.9 g/dL — AB (ref 12.0–15.0)
LYMPHS PCT: 20.1 % (ref 12.0–46.0)
Lymphs Abs: 1.4 10*3/uL (ref 0.7–4.0)
MCHC: 33.7 g/dL (ref 30.0–36.0)
MCV: 81.9 fl (ref 78.0–100.0)
MONOS PCT: 5.2 % (ref 3.0–12.0)
Monocytes Absolute: 0.4 10*3/uL (ref 0.1–1.0)
NEUTROS ABS: 5.1 10*3/uL (ref 1.4–7.7)
Neutrophils Relative %: 72 % (ref 43.0–77.0)
PLATELETS: 322 10*3/uL (ref 150.0–400.0)
RBC: 4.29 Mil/uL (ref 3.87–5.11)
RDW: 14.5 % (ref 11.5–15.5)
WBC: 7.1 10*3/uL (ref 4.0–10.5)

## 2015-07-13 LAB — HEMOGLOBIN A1C: Hgb A1c MFr Bld: 8.5 % — ABNORMAL HIGH (ref 4.6–6.5)

## 2015-07-13 LAB — TSH: TSH: 2.91 u[IU]/mL (ref 0.35–4.50)

## 2015-07-14 LAB — MICROALBUMIN / CREATININE URINE RATIO
Creatinine,U: 245 mg/dL
MICROALB UR: 19.1 mg/dL — AB (ref 0.0–1.9)
Microalb Creat Ratio: 7.8 mg/g (ref 0.0–30.0)

## 2015-07-16 ENCOUNTER — Telehealth: Payer: Self-pay | Admitting: Internal Medicine

## 2015-07-16 MED ORDER — ROSUVASTATIN CALCIUM 5 MG PO TABS: 5.0000 mg | ORAL_TABLET | Freq: Every day | ORAL | Status: DC

## 2015-07-16 NOTE — Telephone Encounter (Signed)
Tried calling number on file, wouldn't ring. LVM on daughter's number to call back and discuss lab results.

## 2015-07-16 NOTE — Telephone Encounter (Signed)
Her sugars have been better controlled than 3 months ago, but are still too high.  Increase exercise, work on weight loss and improve diet.  Her a1c is now 8.5% and it needs to be less than 8.  Her cholesterol is too high.  I think she needs a stronger medication than pravastatin - is she willing to change to lipitor (atorvastatin)?  Her other basic blood work is good (blood counts, thyroid, kidney and liver)

## 2015-07-16 NOTE — Telephone Encounter (Signed)
Spoke with pt to inform. Crestor has been sent to pharm. Labs need to be rechecked in 8-10 weeks. Pt is unable to take Lipitor.

## 2015-07-23 ENCOUNTER — Ambulatory Visit: Payer: Medicare Other | Admitting: Family Medicine

## 2015-07-27 ENCOUNTER — Other Ambulatory Visit: Payer: Self-pay | Admitting: *Deleted

## 2015-07-27 DIAGNOSIS — I10 Essential (primary) hypertension: Secondary | ICD-10-CM

## 2015-07-27 MED ORDER — CLONIDINE HCL 0.3 MG PO TABS: ORAL_TABLET | ORAL | Status: DC

## 2015-07-29 ENCOUNTER — Other Ambulatory Visit: Payer: Self-pay | Admitting: *Deleted

## 2015-07-29 MED ORDER — AMLODIPINE BESYLATE 10 MG PO TABS
ORAL_TABLET | ORAL | Status: DC
Start: 2015-07-29 — End: 2016-08-06

## 2015-08-04 DIAGNOSIS — E113511 Type 2 diabetes mellitus with proliferative diabetic retinopathy with macular edema, right eye: Secondary | ICD-10-CM | POA: Diagnosis not present

## 2015-08-04 DIAGNOSIS — E113512 Type 2 diabetes mellitus with proliferative diabetic retinopathy with macular edema, left eye: Secondary | ICD-10-CM | POA: Diagnosis not present

## 2015-08-04 DIAGNOSIS — Z961 Presence of intraocular lens: Secondary | ICD-10-CM | POA: Diagnosis not present

## 2015-09-13 ENCOUNTER — Telehealth: Payer: Self-pay | Admitting: *Deleted

## 2015-09-13 NOTE — Telephone Encounter (Signed)
Most likely it is viral so there is no medication that will stop the diarrhea.  I can send in an anti-nausea medication if she wishes.  She can also try imodium as needed for the diarrhea is she needs to, but ideally she should avoid it and just let it run its course.  She needs to keep up with her fluids and eat a bland/ BRAT diet.  If no improvement she should come in to be evaluated.

## 2015-09-13 NOTE — Telephone Encounter (Signed)
Please advise if pt should have meds sent in?

## 2015-09-13 NOTE — Telephone Encounter (Signed)
Called pt she stated she is not having any other symptoms at times she does feel nauseated, No one else in the home has been sick. She states that her BS has been running high 180-200, every time she eat she states about an hour later she goes to the bathroom...Wendy [name]

## 2015-09-13 NOTE — Telephone Encounter (Signed)
Received call pt states she has been having some diarrhea now for about 2-3 days. Denied any other sxs no fever. Pt is wanting md to rx something to help get rid of sxs...Wendy [name]

## 2015-09-13 NOTE — Telephone Encounter (Signed)
Call patient - see what other symptoms she is having (fever, abdominal pain, nausea, vomiting, cold symptoms?).  How many times a day is she having diarrhea?  Any blood in the stool?  Anyone else sick?  I can give an anti-nausea med if needed.

## 2015-09-13 NOTE — Telephone Encounter (Signed)
Spoke with pt to inform. She said the symptoms have settled down and will call if things have not improved.

## 2015-10-09 ENCOUNTER — Other Ambulatory Visit: Payer: Self-pay | Admitting: Internal Medicine

## 2015-10-11 NOTE — Telephone Encounter (Signed)
Pt called to check up on this requsete, please help is out of this med.

## 2015-10-11 NOTE — Telephone Encounter (Signed)
Refill request has been sent to pharm.

## 2015-10-12 ENCOUNTER — Encounter: Payer: Self-pay | Admitting: Internal Medicine

## 2015-10-12 ENCOUNTER — Ambulatory Visit (INDEPENDENT_AMBULATORY_CARE_PROVIDER_SITE_OTHER): Payer: Medicare Other | Admitting: Internal Medicine

## 2015-10-12 VITALS — BP 128/58 | HR 75 | Temp 98.5°F | Resp 18 | Wt 193.0 lb

## 2015-10-12 DIAGNOSIS — I10 Essential (primary) hypertension: Secondary | ICD-10-CM

## 2015-10-12 DIAGNOSIS — E1165 Type 2 diabetes mellitus with hyperglycemia: Secondary | ICD-10-CM

## 2015-10-12 DIAGNOSIS — E785 Hyperlipidemia, unspecified: Secondary | ICD-10-CM

## 2015-10-12 DIAGNOSIS — Z794 Long term (current) use of insulin: Secondary | ICD-10-CM

## 2015-10-12 DIAGNOSIS — E113419 Type 2 diabetes mellitus with severe nonproliferative diabetic retinopathy with macular edema, unspecified eye: Secondary | ICD-10-CM | POA: Diagnosis not present

## 2015-10-12 DIAGNOSIS — Z780 Asymptomatic menopausal state: Secondary | ICD-10-CM

## 2015-10-12 DIAGNOSIS — IMO0001 Reserved for inherently not codable concepts without codable children: Secondary | ICD-10-CM

## 2015-10-12 DIAGNOSIS — IMO0002 Reserved for concepts with insufficient information to code with codable children: Secondary | ICD-10-CM

## 2015-10-12 MED ORDER — INSULIN ASPART 100 UNIT/ML ~~LOC~~ SOLN
8.0000 [IU] | Freq: Three times a day (TID) | SUBCUTANEOUS | Status: DC
Start: 2015-10-12 — End: 2016-02-09

## 2015-10-12 NOTE — Assessment & Plan Note (Signed)
Last lipid panel not controlled on pravastatin Taking crestor 5 mg a day and denies side effects Recheck lipid panel - will increase crestor dose if needed Work on increasing exercise and weight loss

## 2015-10-12 NOTE — Assessment & Plan Note (Signed)
She is not as compliant to a diabetic diet as she could be, is not exercising and has not lost weight - she does not seem very motivated to make changes Will refer to endocrine for help adjust insulin - referral ordered Will recheck a1c Encouraged lifestyle changes - increasing exercise and weight loss

## 2015-10-12 NOTE — Assessment & Plan Note (Signed)
BP Readings from Last 3 Encounters:  10/12/15 128/58  07/12/15 130/64  06/16/15 122/68   bp has been well controlled Continue current medications

## 2015-10-12 NOTE — Progress Notes (Signed)
Pre visit review using our clinic review tool, if applicable. No additional management support is needed unless otherwise documented below in the visit note. 

## 2015-10-12 NOTE — Patient Instructions (Signed)
  We have reviewed your prior records including labs and tests today.  Test(s) ordered today. Your results will be released to [place] (or called to you) after review, usually within 72hours after test completion. If any changes need to be made, you will be notified at that same time.  All other Health Maintenance issues reviewed.   All recommended immunizations and age-appropriate screenings are up-to-date.  No immunizations administered today.   Medications reviewed and updated.  No changes recommended at this time.  Your prescription(s) have been submitted to your pharmacy. Please take as directed and contact our office if you believe you are having problem(s) with the medication(s).  A referral was ordered for endocrine to help [name] improve your diabetes.  Please schedule followup in 4 months

## 2015-10-12 NOTE — Progress Notes (Signed)
Subjective:    Patient ID: [name], female    DOB: Aug 19, 1941, 75 y.o.   MRN: [id number]  HPI She is here for a 3 months follow up for diabetes, htn and hyperlipidemia.  Diabetes: She is taking her medication daily as prescribed. She is fairly compliant with a diabetic diet - she has decreased her carbs and sweets. She is not exercising regularly. She monitors her sugars and they have been running 140-200 fasting, up to 200 if she eats at noon.  She still eats a snack a night some nights and her sugars is higher in the morning.    Hypertension: She is taking her medication daily. She is compliant with a low sodium diet.  She denies chest pain, palpitations, edema, shortness of breath and regular headaches. She is not exercising regularly.  She does not monitor her blood pressure at home.    Hyperlipidemia: She is taking her medication daily. She is compliant with a low fat/cholesterol diet. She is not exercising regularly. She denies myalgias.     Medications and allergies reviewed with patient and updated if appropriate.  Patient Active Problem List   Diagnosis Date Noted  . Seasonal allergies 12/01/2013  . Chronic diastolic heart failure ([place]) 07/06/2011  . TOBACCO USE 04/13/2008  . DM (diabetes mellitus), type 2, uncontrolled ([place]) 11/29/2006  . Hyperlipidemia 11/29/2006  . OBESITY, BMI 30-35 11/29/2006  . HYPERTENSION, BENIGN SYSTEMIC 11/29/2006    Current Outpatient Prescriptions on File Prior to Visit  Medication Sig Dispense Refill  . amLODipine (NORVASC) 10 MG tablet TAKE 1 TABLET (10 MG TOTAL) BY MOUTH DAILY. 30 tablet 10  . aspirin 81 MG tablet Take 81 mg by mouth daily.      . Calcium Carbonate-Vit D-Min 600-400 MG-UNIT TABS Take 1 tablet by mouth daily.     . carvedilol (COREG) 25 MG tablet TAKE 1 TABLET BY MOUTH TWICE A DAY WITH A MEAL 90 tablet 3  . cloNIDine (CATAPRES) 0.3 MG tablet TAKE 1 TABLET (0.3 MG TOTAL) BY MOUTH 2 (TWO) TIMES DAILY. 60 tablet 3  .  [facility] SENNA PLUS 8.6-50 MG per tablet TAKE 1 TABLET BY MOUTH AT BEDTIME. 30 tablet 3  . hydrochlorothiazide (HYDRODIURIL) 25 MG tablet TAKE 1 TABLET (25 MG TOTAL) BY MOUTH DAILY. 30 tablet 10  . insulin aspart (NOVOLOG) 100 UNIT/ML injection Inject 8-12 Units into the skin 3 (three) times daily before meals. 2 vial 0  . insulin glargine (LANTUS) 100 UNIT/ML injection INJECT 0.24 MLS (24 UNITS TOTAL) INTO THE SKIN EVERY MORNING. 10 mL 3  . lisinopril (PRINIVIL,ZESTRIL) 40 MG tablet TAKE 1 TABLET (40 MG TOTAL) BY MOUTH DAILY. 30 tablet 10  . metFORMIN (GLUCOPHAGE) 500 MG tablet Take 2 tablets (1,000 mg total) by mouth 2 (two) times daily with a meal. 120 tablet 6  . NOVOLOG 100 UNIT/ML injection INJECT 10 UNITS INTO THE SKIN 3 (THREE) TIMES DAILY WITH MEALS. DISPENSE: QUANTITY FOR 1-MTH SUPPLY 10 mL 3  . pravastatin (PRAVACHOL) 80 MG tablet TAKE 1 TABLET (80 MG TOTAL) BY MOUTH DAILY. 30 tablet 5  . rosuvastatin (CRESTOR) 5 MG tablet Take 1 tablet (5 mg total) by mouth daily. --- Have labs redrawn in 8-10 weeks 90 tablet 1   No current facility-administered medications on file prior to visit.    Past Medical History  Diagnosis Date  . BREAST MASS, LEFT 04/13/2008    Per note 04/13/2008: U/S guided core needle biopsy of left breast on 02/21/08. Benign  per pathologist read but was referred to Dr. Brantley Stage for surgical excision and patient reports this was excised.    . FRACTURE, TOE, RIGHT 10/08/2007  . SYSTOLIC MURMUR [id number]  . Heartburn 11/29/2006    Qualifier: Diagnosis of  By: Herma Ard    . ECZEMA, ATOPIC DERMATITIS 11/29/2006    Qualifier: Diagnosis of  By: Herma Ard    . Colon polyps   . DM (diabetes mellitus) ([place])   . HLD (hyperlipidemia)   . HTN (hypertension)     Past Surgical History  Procedure Laterality Date  . Breast lumpectomy Left     Social History   Social History  . Marital Status: Married    Spouse Name: N/A  . Number of Children: 4  . Years of  Education: N/A   Occupational History  . DAY CARE    Social History Main Topics  . Smoking status: Never Smoker   . Smokeless tobacco: Former Systems developer    Types: Snuff    Quit date: 10/02/2014     Comment: Quit 10/02/2014  previous 1 box per week of snuff   . Alcohol Use: No  . Drug Use: No  . Sexual Activity: Not Asked   Other Topics Concern  . None   Social History Narrative    Family History  Problem Relation Age of Onset  . Diabetes Sister     x 4  . Diabetes Brother   . Diabetes Paternal Grandmother   . Diabetes Paternal Aunt   . Heart disease Brother   . Heart disease Sister   . Emphysema Brother     Review of Systems  Constitutional: Negative for fever.  Respiratory: Positive for cough. Negative for shortness of breath and wheezing.   Cardiovascular: Positive for leg swelling (occasionally - salt or prolonged standing). Negative for chest pain and palpitations.  Gastrointestinal:       GERD occasionally  Neurological: Negative for light-headedness and headaches.       Objective:   Filed Vitals:   10/12/15 1119  BP: 128/58  Pulse: 75  Temp: 98.5 F (36.9 C)  Resp: 18   Filed Weights   10/12/15 1119  Weight: 193 lb (87.544 kg)   Body mass index is 35.29 kg/(m^2).   Physical Exam Constitutional: Appears well-developed and well-nourished. No distress.  Neck: Neck supple. No tracheal deviation present. No thyromegaly present.  No carotid bruit. No cervical adenopathy.   Cardiovascular: Normal rate, regular rhythm and normal heart sounds.   No murmur heard.  No edema Pulmonary/Chest: Effort normal and breath sounds normal. No respiratory distress. No wheezes.           Assessment & Plan:   See Problem List for Assessment and Plan of chronic medical problems.  Never had a dexa - dexa ordered  Follow up in 4 months

## 2015-10-20 ENCOUNTER — Other Ambulatory Visit (INDEPENDENT_AMBULATORY_CARE_PROVIDER_SITE_OTHER): Payer: Medicare Other

## 2015-10-20 DIAGNOSIS — I10 Essential (primary) hypertension: Secondary | ICD-10-CM

## 2015-10-20 DIAGNOSIS — E1165 Type 2 diabetes mellitus with hyperglycemia: Secondary | ICD-10-CM

## 2015-10-20 DIAGNOSIS — IMO0002 Reserved for concepts with insufficient information to code with codable children: Secondary | ICD-10-CM

## 2015-10-20 DIAGNOSIS — Z794 Long term (current) use of insulin: Secondary | ICD-10-CM | POA: Diagnosis not present

## 2015-10-20 DIAGNOSIS — E113419 Type 2 diabetes mellitus with severe nonproliferative diabetic retinopathy with macular edema, unspecified eye: Secondary | ICD-10-CM | POA: Diagnosis not present

## 2015-10-20 DIAGNOSIS — E785 Hyperlipidemia, unspecified: Secondary | ICD-10-CM | POA: Diagnosis not present

## 2015-10-20 LAB — COMPREHENSIVE METABOLIC PANEL
ALBUMIN: 4.1 g/dL (ref 3.5–5.2)
ALK PHOS: 74 U/L (ref 39–117)
ALT: 5 U/L (ref 0–35)
AST: 6 U/L (ref 0–37)
BILIRUBIN TOTAL: 0.3 mg/dL (ref 0.2–1.2)
BUN: 28 mg/dL — ABNORMAL HIGH (ref 6–23)
CALCIUM: 9.4 mg/dL (ref 8.4–10.5)
CO2: 29 meq/L (ref 19–32)
CREATININE: 1.29 mg/dL — AB (ref 0.40–1.20)
Chloride: 99 mEq/L (ref 96–112)
GFR: 51.91 mL/min — AB (ref >60.00)
Glucose, Bld: 263 mg/dL — ABNORMAL HIGH (ref 70–99)
Potassium: 4.4 mEq/L (ref 3.5–5.1)
Sodium: 137 mEq/L (ref 135–145)
TOTAL PROTEIN: 7.6 g/dL (ref 6.0–8.3)

## 2015-10-20 LAB — LIPID PANEL
CHOL/HDL RATIO: 3
Cholesterol: 165 mg/dL (ref 0–200)
HDL: 50.2 mg/dL (ref >39.00)
LDL Cholesterol: 92 mg/dL (ref 0–99)
NonHDL: 114.72
TRIGLYCERIDES: 112 mg/dL (ref 0.0–149.0)
VLDL: 22.4 mg/dL (ref 0.0–40.0)

## 2015-10-20 LAB — HEMOGLOBIN A1C: Hgb A1c MFr Bld: 8.2 % — ABNORMAL HIGH (ref 4.6–6.5)

## 2015-10-22 ENCOUNTER — Encounter: Payer: Self-pay | Admitting: Emergency Medicine

## 2015-11-02 ENCOUNTER — Encounter: Payer: Self-pay | Admitting: Internal Medicine

## 2015-11-02 ENCOUNTER — Ambulatory Visit (INDEPENDENT_AMBULATORY_CARE_PROVIDER_SITE_OTHER): Payer: Medicare Other | Admitting: Internal Medicine

## 2015-11-02 VITALS — BP 108/64 | HR 70 | Temp 97.8°F | Resp 12 | Ht 62.0 in | Wt 188.0 lb

## 2015-11-02 DIAGNOSIS — E1165 Type 2 diabetes mellitus with hyperglycemia: Secondary | ICD-10-CM

## 2015-11-02 DIAGNOSIS — E113419 Type 2 diabetes mellitus with severe nonproliferative diabetic retinopathy with macular edema, unspecified eye: Secondary | ICD-10-CM

## 2015-11-02 DIAGNOSIS — Z794 Long term (current) use of insulin: Secondary | ICD-10-CM

## 2015-11-02 DIAGNOSIS — IMO0002 Reserved for concepts with insufficient information to code with codable children: Secondary | ICD-10-CM | POA: Insufficient documentation

## 2015-11-02 MED ORDER — METFORMIN HCL 500 MG PO TABS: 500.0000 mg | ORAL_TABLET | Freq: Two times a day (BID) | ORAL | Status: DC

## 2015-11-02 NOTE — Progress Notes (Signed)
Patient ID: Wendy [name], female   DOB: 04-15-1941, 75 y.o.   MRN: [id number]  HPI: Wendy [name] is a 75 y.o.-year-old female, referred by her PCP, Dr. Quay Burow, for management of DM2, dx in 19s, insulin-dependent since 1990, uncontrolled, with complications (CKD, DR).  Last hemoglobin A1c was: Lab Results  Component Value Date   HGBA1C 8.2* 10/20/2015   HGBA1C 8.5* 07/13/2015   HGBA1C 9.5 04/08/2015   Pt is on a regimen of: - Metformin 500 mg 2x a day, with meals - Lantus 24 units in am - Novolog 10 units 2x a day, after B and D!  Pt checks her sugars 3-6 a day and they are - per review of meter values in last 2 weeks: - am: 92-254 - 2h after b'fast: 149, 210-261, 318 - before lunch: 115-237 - 2h after lunch: 154-237 - before dinner: 171-277 - 2h after dinner: 284 - bedtime: 189-254 - nighttime: 185-265 No lows. Lowest sugar was 50s x1 -2-3 mo ago, more recently 57; she has hypoglycemia awareness at 60.  Highest sugar was 342 in last month.  Glucometer: [employer]  Pt's meals are: - Breakfast: toast, egg, bacon; sometimes cereals - Lunch: [place] sandwich, soups, crackers - Dinner: baked chicken, potatoes, cabbage (largest) - Snacks: PB and crackers  - + CKD, last BUN/creatinine:  Lab Results  Component Value Date   BUN 28* 10/20/2015   CREATININE 1.29* 10/20/2015  On Lisinopril. - last set of lipids: Lab Results  Component Value Date   CHOL 165 10/20/2015   HDL 50.20 10/20/2015   LDLCALC 92 10/20/2015   LDLDIRECT 91 10/25/2012   TRIG 112.0 10/20/2015   CHOLHDL 3 10/20/2015  On Crestor 5 >> off now b/c mm pain in arm.. - last eye exam was on 01/14/2015. 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  - no numbness and tingling in her feet. + pain in feet. On ASA 81.  Pt has FH of DM in sister, brother, grandparents.  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  ROS: Constitutional: + weight gain, + fatigue, + hot flushes, + poor sleep, + nocturia Eyes: + blurry  vision, no xerophthalmia ENT: no sore throat, no nodules palpated in throat, no dysphagia/odynophagia, no hoarseness, + tinnitus, + hypoacusis Cardiovascular: no CP/SOB/no palpitations/+ leg swelling Respiratory: no cough/SOB Gastrointestinal: + N/no V/+ D/+ C, + heartburn Musculoskeletal: + muscle aches/+ joint aches Skin: no rashes, + itching, + easy bruising, + hair loss Neurological: no tremors/numbness/tingling/dizziness Psychiatric: no depression/anxiety  Past Medical History  Diagnosis Date  . BREAST MASS, LEFT 04/13/2008    Per note 04/13/2008: U/S guided core needle biopsy of left breast on 02/21/08. Benign per pathologist read but was referred to Dr. Brantley Stage for surgical excision and patient reports this was excised.    . FRACTURE, TOE, RIGHT 10/08/2007  . SYSTOLIC MURMUR [id number]  . Heartburn 11/29/2006    Qualifier: Diagnosis of  By: Herma Ard    . ECZEMA, ATOPIC DERMATITIS 11/29/2006    Qualifier: Diagnosis of  By: Herma Ard    . Colon polyps   . DM (diabetes mellitus) ([place])   . HLD (hyperlipidemia)   . HTN (hypertension)    Past Surgical History  Procedure Laterality Date  . Breast lumpectomy Left    Social History   Social History  . Marital Status: Married    Spouse Name: N/A  . Number of Children: 4   . retired    Social History Main Topics  .  Smoking status: Never Smoker   . Smokeless tobacco: Former Systems developer    Types: Snuff    Quit date: 10/02/2014     Comment: Quit 10/02/2014  previous 1 box per week of snuff   . Alcohol Use: No  . Drug Use: No   Current Outpatient Prescriptions on File Prior to Visit  Medication Sig Dispense Refill  . amLODipine (NORVASC) 10 MG tablet TAKE 1 TABLET (10 MG TOTAL) BY MOUTH DAILY. 30 tablet 10  . aspirin 81 MG tablet Take 81 mg by mouth daily.      . Calcium Carbonate-Vit D-Min 600-400 MG-UNIT TABS Take 1 tablet by mouth daily.     . carvedilol (COREG) 25 MG tablet TAKE 1 TABLET BY MOUTH TWICE A DAY WITH A  MEAL 90 tablet 3  . cloNIDine (CATAPRES) 0.3 MG tablet TAKE 1 TABLET (0.3 MG TOTAL) BY MOUTH 2 (TWO) TIMES DAILY. 60 tablet 3  . [facility] SENNA PLUS 8.6-50 MG per tablet TAKE 1 TABLET BY MOUTH AT BEDTIME. 30 tablet 3  . hydrochlorothiazide (HYDRODIURIL) 25 MG tablet TAKE 1 TABLET (25 MG TOTAL) BY MOUTH DAILY. 30 tablet 10  . insulin aspart (NOVOLOG) 100 UNIT/ML injection Inject 8-12 Units into the skin 3 (three) times daily before meals. 2 vial 5  . insulin glargine (LANTUS) 100 UNIT/ML injection INJECT 0.24 MLS (24 UNITS TOTAL) INTO THE SKIN EVERY MORNING. 10 mL 3  . lisinopril (PRINIVIL,ZESTRIL) 40 MG tablet TAKE 1 TABLET (40 MG TOTAL) BY MOUTH DAILY. 30 tablet 10  . metFORMIN (GLUCOPHAGE) 500 MG tablet Take 2 tablets (1,000 mg total) by mouth 2 (two) times daily with a meal. 120 tablet 6  . NOVOLOG 100 UNIT/ML injection INJECT 10 UNITS INTO THE SKIN 3 (THREE) TIMES DAILY WITH MEALS. DISPENSE: QUANTITY FOR 1-MTH SUPPLY 10 mL 3  . rosuvastatin (CRESTOR) 5 MG tablet Take 1 tablet (5 mg total) by mouth daily. --- Have labs redrawn in 8-10 weeks 90 tablet 1   No current facility-administered medications on file prior to visit.   No Known Allergies Family History  Problem Relation Age of Onset  . Diabetes Sister     x 4  . Diabetes Brother   . Diabetes Paternal Grandmother   . Diabetes Paternal Aunt   . Heart disease Brother   . Heart disease Sister   . Emphysema Brother    PE: BP 108/64 mmHg  Pulse 70  Temp(Src) 97.8 F (36.6 C) (Oral)  Resp 12  Ht 5\' 2"  (1.575 m)  Wt 188 lb (85.276 kg)  BMI 34.38 kg/m2  SpO2 97% Wt Readings from Last 3 Encounters:  11/02/15 188 lb (85.276 kg)  10/12/15 193 lb (87.544 kg)  07/12/15 193 lb (87.544 kg)   Constitutional: overweight, in NAD Eyes: PERRLA, EOMI, no exophthalmos ENT: moist mucous membranes, no thyromegaly, no cervical lymphadenopathy Cardiovascular: RRR, No MRG Respiratory: CTA B Gastrointestinal: abdomen soft, NT, ND,  BS+ Musculoskeletal: no deformities, strength intact in all 4 Skin: moist, warm, no rashes Neurological: no tremor with outstretched hands, DTR normal in all 4  ASSESSMENT: 1. DM2, insulin-dependent, uncontrolled, with complications - + DR (severe, non-proliferative)  PLAN:  1. Patient with long-standing, uncontrolled diabetes, on basal-bolus insulin antidiabetic regimen, which is insufficient. She has high sugars throughout the day >> will need to add Novolog with lunch, too. I will give her a more flexible regimen depending on the size of the meal, which i believe she had originally but converted to a single dose, of  10 units with all B and D meals. I also advised her to move Novolog before meals as she is now taking it after meals >> can have lows later after a meal. She is only taking half-max Metformin dose, which I will not increase (CKD). - also discussed about improving diet. She saw nutrition in the past >> advised her to let me know if she wants a new referral. - I suggested to:  Patient Instructions  Please continue: - Lantus 24 units in am - Metformin 500 mg 2x a day with meals.  Please change the NovoLog doses as follows: - 10 units with a smaller meal - 12 units with a larger meal  Try to take insulin with lunch, also! Start with 8 units and increase to 10 units if sugars before dinner are not <150 in 1 week.  Please move Novolog to 15 min before meals.  Please let me know if the sugars are consistently <80 or >200.  Please return in 1.5 months with your sugar log.   - continue checking sugars at different times of the day - check 3 times a day, rotating checks - given sugar log and advised how to fill it and to bring it at next appt  - given foot care handout and explained the principles  - given instructions for hypoglycemia management "15-15 rule"  - advised for yearly eye exams  >> she is UTD as she has severe DR - She recently stopped her Crestor for muscle pain  a left arm , but I advised her to restart every other day to see if her muscle pain comes back - Return to clinic in 1.5 mo with sugar log

## 2015-11-02 NOTE — Patient Instructions (Addendum)
Please continue: - Lantus 24 units in am - Metformin 500 mg 2x a day with meals.  Please change the NovoLog doses as follows: - 10 units with a smaller meal - 12 units with a larger meal  Try to take insulin with lunch, also! Start with 8 units and increase to 10 units if sugars before dinner are not <150 in 1 week.  Please move Novolog to 15 min before meals.  Please let me know if the sugars are consistently <80 or >200.  Please return in 1.5 months with your sugar log.   PATIENT INSTRUCTIONS FOR TYPE 2 DIABETES:  **Please join MyChart!** - see attached instructions about how to join if you have not done so already.  DIET AND EXERCISE Diet and exercise is an important part of diabetic treatment.  We recommended aerobic exercise in the form of brisk walking (working between 40-60% of maximal aerobic capacity, similar to brisk walking) for 150 minutes per week (such as 30 minutes five days per week) along with 3 times per week performing 'resistance' training (using various gauge rubber tubes with handles) 5-10 exercises involving the major muscle groups (upper body, lower body and core) performing 10-15 repetitions (or near fatigue) each exercise. Start at half the above goal but build slowly to reach the above goals. If limited by weight, joint pain, or disability, we recommend daily walking in a swimming pool with water up to waist to reduce pressure from joints while allow for adequate exercise.    BLOOD GLUCOSES Monitoring your blood glucoses is important for continued management of your diabetes. Please check your blood glucoses 2-4 times a day: fasting, before meals and at bedtime (you can rotate these measurements - e.g. one day check before the 3 meals, the next day check before 2 of the meals and before bedtime, etc.).   HYPOGLYCEMIA (low blood sugar) Hypoglycemia is usually a reaction to not eating, exercising, or taking too much insulin/ other diabetes drugs.  Symptoms  include tremors, sweating, hunger, confusion, headache, etc. Treat IMMEDIATELY with 15 grams of Carbs: . 4 glucose tablets .  cup regular juice/soda . 2 tablespoons raisins . 4 teaspoons sugar . 1 tablespoon honey Recheck blood glucose in 15 mins and repeat above if still symptomatic/blood glucose <100.  RECOMMENDATIONS TO REDUCE YOUR RISK OF DIABETIC COMPLICATIONS: * Take your prescribed MEDICATION(S) * Follow a DIABETIC diet: Complex carbs, fiber rich foods, (monounsaturated and polyunsaturated) fats * AVOID saturated/trans fats, high fat foods, >2,300 mg salt per day. * EXERCISE at least 5 times a week for 30 minutes or preferably daily.  * DO NOT SMOKE OR DRINK more than 1 drink a day. * Check your FEET every day. Do not wear tightfitting shoes. Contact us if you develop an ulcer * See your EYE doctor once a year or more if needed * Get a FLU shot once a year * Get a PNEUMONIA vaccine once before and once after age 54 years  GOALS:  * Your Hemoglobin A1c of <7%  * fasting sugars need to be <130 * after meals sugars need to be <180 (2h after you start eating) * Your Systolic BP should be [id number] or lower  * Your Diastolic BP should be 80 or lower  * Your HDL (Good Cholesterol) should be 40 or higher  * Your LDL (Bad Cholesterol) should be 100 or lower. * Your Triglycerides should be 150 or lower  * Your Urine microalbumin (kidney function) should be <30 * Your Body  Mass Index should be 25 or lower    Please consider the following ways to cut down carbs and fat and increase fiber and micronutrients in your diet: - substitute whole grain for white bread or pasta - substitute brown rice for white rice - substitute 90-calorie flat bread pieces for slices of bread when possible - substitute sweet potatoes or yams for white potatoes - substitute humus for margarine - substitute tofu for cheese when possible - substitute almond or rice milk for regular milk (would not drink soy milk  daily due to concern for soy estrogen influence on breast cancer risk) - substitute dark chocolate for other sweets when possible - substitute water - can add lemon or orange slices for taste - for diet sodas (artificial sweeteners will trick your body that you can eat sweets without getting calories and will lead you to overeating and weight gain in the long run) - do not skip breakfast or other meals (this will slow down the metabolism and will result in more weight gain over time)  - can try smoothies made from fruit and almond/rice milk in am instead of regular breakfast - can also try old-fashioned (not instant) oatmeal made with almond/rice milk in am - order the dressing on the side when eating salad at a restaurant (pour less than half of the dressing on the salad) - eat as little meat as possible - can try juicing, but should not forget that juicing will get rid of the fiber, so would alternate with eating raw veg./fruits or drinking smoothies - use as little oil as possible, even when using olive oil - can dress a salad with a mix of balsamic vinegar and lemon juice, for e.g. - use agave nectar, stevia sugar, or regular sugar rather than artificial sweateners - steam or broil/roast veggies  - snack on veggies/fruit/nuts (unsalted, preferably) when possible, rather than processed foods - reduce or eliminate aspartame in diet (it is in diet sodas, chewing gum, etc) Read the labels!  Try to read Dr. Janene Harvey book: "Program for Reversing Diabetes" for other ideas for healthy eating.

## 2015-11-09 DIAGNOSIS — Z961 Presence of intraocular lens: Secondary | ICD-10-CM | POA: Diagnosis not present

## 2015-11-09 DIAGNOSIS — E113511 Type 2 diabetes mellitus with proliferative diabetic retinopathy with macular edema, right eye: Secondary | ICD-10-CM | POA: Diagnosis not present

## 2015-11-09 DIAGNOSIS — E113512 Type 2 diabetes mellitus with proliferative diabetic retinopathy with macular edema, left eye: Secondary | ICD-10-CM | POA: Diagnosis not present

## 2015-11-23 ENCOUNTER — Encounter: Payer: Self-pay | Admitting: *Deleted

## 2015-11-23 NOTE — Telephone Encounter (Signed)
This encounter was created in error - please disregard.

## 2015-11-26 ENCOUNTER — Telehealth: Payer: Self-pay | Admitting: Internal Medicine

## 2015-11-26 MED ORDER — INSULIN GLARGINE 100 UNIT/ML ~~LOC~~ SOLN: SUBCUTANEOUS | Status: DC

## 2015-11-26 NOTE — Telephone Encounter (Signed)
Pt requesting insulin glargine (LANTUS) 100 UNIT/ML injection [id number]  Pharmacy is [facility] [address].

## 2015-12-09 DIAGNOSIS — Z961 Presence of intraocular lens: Secondary | ICD-10-CM | POA: Diagnosis not present

## 2015-12-09 DIAGNOSIS — E113511 Type 2 diabetes mellitus with proliferative diabetic retinopathy with macular edema, right eye: Secondary | ICD-10-CM | POA: Diagnosis not present

## 2015-12-09 DIAGNOSIS — E113512 Type 2 diabetes mellitus with proliferative diabetic retinopathy with macular edema, left eye: Secondary | ICD-10-CM | POA: Diagnosis not present

## 2015-12-21 ENCOUNTER — Encounter: Payer: Self-pay | Admitting: Internal Medicine

## 2015-12-21 ENCOUNTER — Ambulatory Visit (INDEPENDENT_AMBULATORY_CARE_PROVIDER_SITE_OTHER): Payer: Medicare Other | Admitting: Internal Medicine

## 2015-12-21 VITALS — BP 132/74 | HR 83 | Temp 98.0°F | Resp 12 | Wt 188.0 lb

## 2015-12-21 DIAGNOSIS — IMO0002 Reserved for concepts with insufficient information to code with codable children: Secondary | ICD-10-CM

## 2015-12-21 DIAGNOSIS — E113419 Type 2 diabetes mellitus with severe nonproliferative diabetic retinopathy with macular edema, unspecified eye: Secondary | ICD-10-CM | POA: Diagnosis not present

## 2015-12-21 DIAGNOSIS — Z794 Long term (current) use of insulin: Secondary | ICD-10-CM | POA: Diagnosis not present

## 2015-12-21 DIAGNOSIS — E1165 Type 2 diabetes mellitus with hyperglycemia: Secondary | ICD-10-CM

## 2015-12-21 NOTE — Progress Notes (Signed)
Patient ID: Wendy [name], female   DOB: 11/08/1940, 75 y.o.   MRN: [id number]  HPI: Wendy [name] is a 75 y.o.-year-old female, returning for f/u for DM2, dx in 76s, insulin-dependent since 1990, uncontrolled, with complications (CKD, DR). Last visit 2 mo ago.  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  Last hemoglobin A1c was: Lab Results  Component Value Date   HGBA1C 8.2* 10/20/2015   HGBA1C 8.5* 07/13/2015   HGBA1C 9.5 04/08/2015   Pt is on a regimen of: - Metformin 500 mg 2x a day with meals. - Lantus 24 units in am - NovoLog doses 2x a day as follows 10 (rarely 12 units) units with a smaller meal She stopped insulin with lunch as she was getting too low (70-80).  Pt checks her sugars 1-2 a day and they are still very high - per review of log for last month: - am: 92-254 >> 130, 202-242 - 2h after b'fast: 149, 210-261, 318 >> n/c - before lunch: 115-237 >> 160, 229 - 2h after lunch: 154-237 >> 200 - before dinner: 171-277 >> 249, 254, 400 - 2h after dinner: 284 >> 143 - bedtime: 189-254 >> 103 - nighttime: 185-265 >> 32!x1, 117x2 No lows. Lowest sugar was 32 at night; she has hypoglycemia awareness at 60.  Highest sugar was 342 >> 400x1in last month.  Glucometer: [employer]  Pt's meals are: - Breakfast: toast, egg, bacon; sometimes cereals - Lunch: [place] sandwich, soups, crackers - Dinner: baked chicken, potatoes, cabbage (largest) - Snacks: PB and crackers  - + CKD, last BUN/creatinine:  Lab Results  Component Value Date   BUN 28* 10/20/2015   CREATININE 1.29* 10/20/2015  On Lisinopril. - last set of lipids: Lab Results  Component Value Date   CHOL 165 10/20/2015   HDL 50.20 10/20/2015   LDLCALC 92 10/20/2015   LDLDIRECT 91 10/25/2012   TRIG 112.0 10/20/2015   CHOLHDL 3 10/20/2015  On Crestor 5 >> off now b/c mm pain in arm.. - last eye exam was on 01/14/2015. 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  - no numbness and tingling in her feet. + pain in  feet. On ASA 81. 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  ROS: Constitutional: no weight gain/loss, no fatigue, no subjective hyperthermia/hypothermia Eyes: no blurry vision, no xerophthalmia ENT: no sore throat, no nodules palpated in throat, no dysphagia/odynophagia, no hoarseness Cardiovascular: no CP/SOB/palpitations/leg swelling Respiratory: no cough/SOB Gastrointestinal: no N/V/D/C Musculoskeletal: no muscle/joint aches Skin: no rashes Neurological: no tremors/numbness/tingling/dizziness  I reviewed pt's medications, allergies, PMH, social hx, family hx, and changes were documented in the history of present illness. Otherwise, unchanged from my initial visit note.  Past Medical History  Diagnosis Date  . BREAST MASS, LEFT 04/13/2008    Per note 04/13/2008: U/S guided core needle biopsy of left breast on 02/21/08. Benign per pathologist read but was referred to Dr. Brantley Stage for surgical excision and patient reports this was excised.    . FRACTURE, TOE, RIGHT 10/08/2007  . SYSTOLIC MURMUR [id number]  . Heartburn 11/29/2006    Qualifier: Diagnosis of  By: Herma Ard    . ECZEMA, ATOPIC DERMATITIS 11/29/2006    Qualifier: Diagnosis of  By: Herma Ard    . Colon polyps   . DM (diabetes mellitus) ([place])   . HLD (hyperlipidemia)   . HTN (hypertension)    Past Surgical History  Procedure Laterality Date  . Breast lumpectomy Left    Social History  Social History  . Marital Status: Married    Spouse Name: N/A  . Number of Children: 4   . retired    Social History Main Topics  . Smoking status: Never Smoker   . Smokeless tobacco: Former Systems developer    Types: Snuff    Quit date: 10/02/2014     Comment: Quit 10/02/2014  previous 1 box per week of snuff   . Alcohol Use: No  . Drug Use: No   Current Outpatient Prescriptions on File Prior to Visit  Medication Sig Dispense Refill  . amLODipine (NORVASC) 10 MG tablet TAKE 1 TABLET (10 MG TOTAL) BY MOUTH DAILY.  30 tablet 10  . aspirin 81 MG tablet Take 81 mg by mouth daily.      . Calcium Carbonate-Vit D-Min 600-400 MG-UNIT TABS Take 1 tablet by mouth daily.     . carvedilol (COREG) 25 MG tablet TAKE 1 TABLET BY MOUTH TWICE A DAY WITH A MEAL 90 tablet 3  . cloNIDine (CATAPRES) 0.3 MG tablet TAKE 1 TABLET (0.3 MG TOTAL) BY MOUTH 2 (TWO) TIMES DAILY. 60 tablet 3  . [facility] SENNA PLUS 8.6-50 MG per tablet TAKE 1 TABLET BY MOUTH AT BEDTIME. 30 tablet 3  . hydrochlorothiazide (HYDRODIURIL) 25 MG tablet TAKE 1 TABLET (25 MG TOTAL) BY MOUTH DAILY. 30 tablet 10  . insulin aspart (NOVOLOG) 100 UNIT/ML injection Inject 8-12 Units into the skin 3 (three) times daily before meals. 2 vial 5  . insulin glargine (LANTUS) 100 UNIT/ML injection INJECT 0.24 MLS (24 UNITS TOTAL) INTO THE SKIN EVERY MORNING. 10 mL 3  . lisinopril (PRINIVIL,ZESTRIL) 40 MG tablet TAKE 1 TABLET (40 MG TOTAL) BY MOUTH DAILY. 30 tablet 10  . metFORMIN (GLUCOPHAGE) 500 MG tablet Take 1 tablet (500 mg total) by mouth 2 (two) times daily with a meal. 180 tablet 6  . NOVOLOG 100 UNIT/ML injection INJECT 10 UNITS INTO THE SKIN 3 (THREE) TIMES DAILY WITH MEALS. DISPENSE: QUANTITY FOR 1-MTH SUPPLY 10 mL 3  . rosuvastatin (CRESTOR) 5 MG tablet Take 1 tablet (5 mg total) by mouth daily. --- Have labs redrawn in 8-10 weeks 90 tablet 1   No current facility-administered medications on file prior to visit.   No Known Allergies Family History  Problem Relation Age of Onset  . Diabetes Sister     x 4  . Diabetes Brother   . Diabetes Paternal Grandmother   . Diabetes Paternal Aunt   . Heart disease Brother   . Heart disease Sister   . Emphysema Brother    PE: BP 132/74 mmHg  Pulse 83  Temp(Src) 98 F (36.7 C) (Oral)  Resp 12  Wt 188 lb (85.276 kg)  SpO2 99% Body mass index is 34.38 kg/(m^2). Wt Readings from Last 3 Encounters:  12/21/15 188 lb (85.276 kg)  11/02/15 188 lb (85.276 kg)  10/12/15 193 lb (87.544 kg)   Constitutional: overweight,  in NAD Eyes: PERRLA, EOMI, no exophthalmos ENT: moist mucous membranes, no thyromegaly, no cervical lymphadenopathy Cardiovascular: RRR, No MRG Respiratory: CTA B Gastrointestinal: abdomen soft, NT, ND, BS+ Musculoskeletal: no deformities, strength intact in all 4 Skin: moist, warm, no rashes Neurological: no tremor with outstretched hands, DTR normal in all 4  ASSESSMENT: 1. DM2, insulin-dependent, uncontrolled, with complications - + DR (severe, non-proliferative)  PLAN:  1. Patient with long-standing, uncontrolled diabetes, on basal-bolus insulin antidiabetic regimen, which is insufficient. She has high sugars throughout the day, especially before dinner (stopped insulin with lunch b/c lows (?),  however, sugars before dinner: 250-400 >> will need to add Novolog with lunch, but will try a lower dose. - will decrease the insulin with dinner b/c recent low at night (32!). Will also decrease insulin with b'fast so she does not have hypoglycemia later in the am - I suggested to:  Patient Instructions  Please continue: - Metformin 500 mg 2x a day with meals. - Lantus 24 units in am  Please use the Novolog: - before b'fast: 8 units - before lunch: 5 units - before dinner: 8 units  Please return in 1.5 months with your sugar log.   - continue checking sugars at different times of the day - check 3 times a day, rotating checks - given more logs - advised for yearly eye exams  >> she is UTD - she has severe DR - Return to clinic in 1.5 mo with sugar log

## 2015-12-21 NOTE — Patient Instructions (Signed)
Please continue: - Metformin 500 mg 2x a day with meals. - Lantus 24 units in am  Please use the Novolog: - before b'fast: 8 units - before lunch: 5 units - before dinner: 8 units  Please return in 1.5 months with your sugar log.

## 2015-12-23 ENCOUNTER — Other Ambulatory Visit: Payer: Self-pay | Admitting: *Deleted

## 2015-12-23 DIAGNOSIS — I10 Essential (primary) hypertension: Secondary | ICD-10-CM

## 2015-12-23 MED ORDER — CLONIDINE HCL 0.3 MG PO TABS
ORAL_TABLET | ORAL | Status: DC
Start: 2015-12-23 — End: 2016-05-03

## 2015-12-29 DIAGNOSIS — E113511 Type 2 diabetes mellitus with proliferative diabetic retinopathy with macular edema, right eye: Secondary | ICD-10-CM | POA: Diagnosis not present

## 2015-12-29 DIAGNOSIS — E113512 Type 2 diabetes mellitus with proliferative diabetic retinopathy with macular edema, left eye: Secondary | ICD-10-CM | POA: Diagnosis not present

## 2015-12-30 ENCOUNTER — Ambulatory Visit (INDEPENDENT_AMBULATORY_CARE_PROVIDER_SITE_OTHER)
Admission: RE | Admit: 2015-12-30 | Discharge: 2015-12-30 | Disposition: A | Discharge status: Home / Self Care | Payer: Medicare Other | Source: Ambulatory Visit | Attending: Internal Medicine | Admitting: Internal Medicine

## 2015-12-30 DIAGNOSIS — Z780 Asymptomatic menopausal state: Secondary | ICD-10-CM | POA: Diagnosis not present

## 2016-01-05 ENCOUNTER — Encounter: Payer: Self-pay | Admitting: Internal Medicine

## 2016-01-05 DIAGNOSIS — M8580 Other specified disorders of bone density and structure, unspecified site: Secondary | ICD-10-CM | POA: Insufficient documentation

## 2016-01-06 ENCOUNTER — Encounter: Payer: Self-pay | Admitting: Emergency Medicine

## 2016-01-13 ENCOUNTER — Other Ambulatory Visit: Payer: Self-pay

## 2016-01-13 ENCOUNTER — Other Ambulatory Visit: Payer: Self-pay | Admitting: Internal Medicine

## 2016-01-13 DIAGNOSIS — Z1231 Encounter for screening mammogram for malignant neoplasm of breast: Secondary | ICD-10-CM

## 2016-01-19 DIAGNOSIS — Z961 Presence of intraocular lens: Secondary | ICD-10-CM | POA: Diagnosis not present

## 2016-01-19 DIAGNOSIS — E113511 Type 2 diabetes mellitus with proliferative diabetic retinopathy with macular edema, right eye: Secondary | ICD-10-CM | POA: Diagnosis not present

## 2016-01-19 DIAGNOSIS — E113512 Type 2 diabetes mellitus with proliferative diabetic retinopathy with macular edema, left eye: Secondary | ICD-10-CM | POA: Diagnosis not present

## 2016-01-26 ENCOUNTER — Ambulatory Visit: Payer: Medicare Other

## 2016-02-04 ENCOUNTER — Ambulatory Visit: Payer: Medicare Other | Admitting: Internal Medicine

## 2016-02-09 ENCOUNTER — Ambulatory Visit: Payer: Medicare Other | Admitting: Internal Medicine

## 2016-02-09 ENCOUNTER — Encounter: Payer: Self-pay | Admitting: Internal Medicine

## 2016-02-09 ENCOUNTER — Other Ambulatory Visit (INDEPENDENT_AMBULATORY_CARE_PROVIDER_SITE_OTHER): Payer: Medicare Other | Admitting: *Deleted

## 2016-02-09 ENCOUNTER — Ambulatory Visit (INDEPENDENT_AMBULATORY_CARE_PROVIDER_SITE_OTHER): Payer: Medicare Other | Admitting: Internal Medicine

## 2016-02-09 VITALS — BP 110/62 | HR 74 | Temp 98.3°F | Resp 12 | Wt 190.4 lb

## 2016-02-09 DIAGNOSIS — IMO0001 Reserved for inherently not codable concepts without codable children: Secondary | ICD-10-CM

## 2016-02-09 DIAGNOSIS — Z794 Long term (current) use of insulin: Secondary | ICD-10-CM | POA: Diagnosis not present

## 2016-02-09 DIAGNOSIS — E1165 Type 2 diabetes mellitus with hyperglycemia: Secondary | ICD-10-CM | POA: Diagnosis not present

## 2016-02-09 LAB — POCT GLYCOSYLATED HEMOGLOBIN (HGB A1C): Hemoglobin A1C: 8.6

## 2016-02-09 MED ORDER — METFORMIN HCL 500 MG PO TABS: 1000.0000 mg | ORAL_TABLET | Freq: Every day | ORAL | Status: DC

## 2016-02-09 MED ORDER — INSULIN ASPART 100 UNIT/ML ~~LOC~~ SOLN: 6.0000 [IU] | Freq: Three times a day (TID) | SUBCUTANEOUS | Status: DC

## 2016-02-09 MED ORDER — INSULIN GLARGINE 100 UNIT/ML ~~LOC~~ SOLN: SUBCUTANEOUS | Status: DC

## 2016-02-09 NOTE — Progress Notes (Signed)
Patient ID: Wendy [name], female   DOB: Feb 12, 1941, 75 y.o.   MRN: [id number]  HPI: Wendy [name] is a 75 y.o.-year-old female, returning for f/u for DM2, dx in 78s, insulin-dependent since 1990, uncontrolled, with complications (CKD, DR). Last visit 2 mo ago.  Last hemoglobin A1c was: Lab Results  Component Value Date   HGBA1C 8.6 02/09/2016   HGBA1C 8.2* 10/20/2015   HGBA1C 8.5* 07/13/2015   Pt was on a regimen of: - Metformin 500 mg 2x a day with meals. - Lantus 24 units in am - NovoLog doses 2x a day as follows 10 (rarely 12 units) units with a smaller meal She stopped insulin with lunch as she was getting too low (70-80).  At last visit, we changed to: - Metformin 500 mg 2x a day with meals. - Lantus 24 units in am - Novolog: - before b'fast: 8 units does not take, as she is out or she is afraid it drops sugars before dinner - before dinner: 8 units  Pt checks her sugars 1-2 a day: - am: 92-254 >> 130, 202-242 >> 173-240 (eats snack at night) - 2h after b'fast: 149, 210-261, 318 >> n/c >> n/c - before lunch: 115-237 >> 160, 229 >> 40 x1, 60 x1 (delayed lunch), 112, 154 - 2h after lunch: 154-237 >> 200 >> n/c - before dinner: 171-277 >> 249, 254, 400 >> n/c - 2h after dinner: 284 >> 143 >> n/c - bedtime: 189-254 >> 103 >> 60x1 - nighttime: 185-265 >> 32!x1, 117x2 >> 132 No lows. Lowest sugar was 32 >> 40 (delayed meal); she has hypoglycemia awareness at 60.  Highest sugar was 342 >> 400x1 >> 200s  Glucometer: [employer]  Pt's meals are: - Breakfast: toast, egg, bacon/sausage; stopped cereals; oatmeal  - Lunch: [place] sandwich, soups, crackers - Dinner: baked chicken, potatoes, cabbage (largest) - Snacks: PB and crackers  - + CKD, last BUN/creatinine:  Lab Results  Component Value Date   BUN 28* 10/20/2015   CREATININE 1.29* 10/20/2015  On Lisinopril. - last set of lipids: Lab Results  Component Value Date   CHOL 165 10/20/2015   HDL 50.20 10/20/2015   LDLCALC 92 10/20/2015   LDLDIRECT 91 10/25/2012   TRIG 112.0 10/20/2015   CHOLHDL 3 10/20/2015  On Crestor 5 >> off now b/c mm pain in arm.. - last eye exam was on 01/14/2015. 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 She had a Laser tx 01/2016. - no numbness and tingling in her feet. + pain in feet. On ASA 81.  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  ROS: Constitutional: no weight gain/loss, no fatigue, no subjective hyperthermia/hypothermia, + nocturia Eyes: no blurry vision, no xerophthalmia ENT: no sore throat, no nodules palpated in throat, no dysphagia/odynophagia, no hoarseness Cardiovascular: no CP/SOB/palpitations/leg swelling Respiratory: no cough/SOB Gastrointestinal: no N/V/D/+ C Musculoskeletal: no muscle/joint aches Skin: no rashes Neurological: no tremors/numbness/tingling/dizziness  I reviewed pt's medications, allergies, PMH, social hx, family hx, and changes were documented in the history of present illness. Otherwise, unchanged from my initial visit note.  Past Medical History  Diagnosis Date  . BREAST MASS, LEFT 04/13/2008    Per note 04/13/2008: U/S guided core needle biopsy of left breast on 02/21/08. Benign per pathologist read but was referred to Dr. Brantley Stage for surgical excision and patient reports this was excised.    . FRACTURE, TOE, RIGHT 10/08/2007  . SYSTOLIC MURMUR [id number]  . Heartburn 11/29/2006    Qualifier: Diagnosis of  By: WOODBURY, ANGELICA    . ECZEMA, ATOPIC DERMATITIS 11/29/2006    Qualifier: Diagnosis of  By: Herma Ard    . Colon polyps   . DM (diabetes mellitus) ([place])   . HLD (hyperlipidemia)   . HTN (hypertension)    Past Surgical History  Procedure Laterality Date  . Breast lumpectomy Left    Social History   Social History  . Marital Status: Married    Spouse Name: N/A  . Number of Children: 4   . retired    Social History Main Topics  . Smoking status: Never Smoker   . Smokeless tobacco: Former Systems developer    Types:  Snuff    Quit date: 10/02/2014     Comment: Quit 10/02/2014  previous 1 box per week of snuff   . Alcohol Use: No  . Drug Use: No   Current Outpatient Prescriptions on File Prior to Visit  Medication Sig Dispense Refill  . amLODipine (NORVASC) 10 MG tablet TAKE 1 TABLET (10 MG TOTAL) BY MOUTH DAILY. 30 tablet 10  . aspirin 81 MG tablet Take 81 mg by mouth daily.      . Calcium Carbonate-Vit D-Min 600-400 MG-UNIT TABS Take 1 tablet by mouth daily.     . carvedilol (COREG) 25 MG tablet TAKE 1 TABLET BY MOUTH TWICE A DAY WITH A MEAL 90 tablet 3  . cloNIDine (CATAPRES) 0.3 MG tablet TAKE 1 TABLET (0.3 MG TOTAL) BY MOUTH 2 (TWO) TIMES DAILY. 60 tablet 3  . [facility] SENNA PLUS 8.6-50 MG per tablet TAKE 1 TABLET BY MOUTH AT BEDTIME. 30 tablet 3  . hydrochlorothiazide (HYDRODIURIL) 25 MG tablet TAKE 1 TABLET (25 MG TOTAL) BY MOUTH DAILY. 30 tablet 10  . insulin aspart (NOVOLOG) 100 UNIT/ML injection Inject 8-12 Units into the skin 3 (three) times daily before meals. 2 vial 5  . insulin glargine (LANTUS) 100 UNIT/ML injection INJECT 0.24 MLS (24 UNITS TOTAL) INTO THE SKIN EVERY MORNING. 10 mL 3  . lisinopril (PRINIVIL,ZESTRIL) 40 MG tablet TAKE 1 TABLET (40 MG TOTAL) BY MOUTH DAILY. 30 tablet 10  . metFORMIN (GLUCOPHAGE) 500 MG tablet Take 1 tablet (500 mg total) by mouth 2 (two) times daily with a meal. 180 tablet 6  . rosuvastatin (CRESTOR) 5 MG tablet Take 1 tablet (5 mg total) by mouth daily. --- Have labs redrawn in 8-10 weeks 90 tablet 1   No current facility-administered medications on file prior to visit.   No Known Allergies Family History  Problem Relation Age of Onset  . Diabetes Sister     x 4  . Diabetes Brother   . Diabetes Paternal Grandmother   . Diabetes Paternal Aunt   . Heart disease Brother   . Heart disease Sister   . Emphysema Brother    PE: BP 110/62 mmHg  Pulse 74  Temp(Src) 98.3 F (36.8 C) (Oral)  Resp 12  Wt 190 lb 6.4 oz (86.365 kg)  SpO2 93% Body mass index is  34.82 kg/(m^2). Wt Readings from Last 3 Encounters:  02/09/16 190 lb 6.4 oz (86.365 kg)  12/21/15 188 lb (85.276 kg)  11/02/15 188 lb (85.276 kg)   Constitutional: overweight, in NAD Eyes: PERRLA, EOMI, no exophthalmos ENT: moist mucous membranes, no thyromegaly, no cervical lymphadenopathy Cardiovascular: RRR, No MRG Respiratory: CTA B Gastrointestinal: abdomen soft, NT, ND, BS+ Musculoskeletal: no deformities, strength intact in all 4 Skin: moist, warm, no rashes Neurological: no tremor with outstretched hands, DTR normal in all 4  ASSESSMENT:  1. DM2, insulin-dependent, uncontrolled, with complications - + DR (severe, non-proliferative)  PLAN:  1. Patient with long-standing, uncontrolled diabetes, on basal-bolus insulin antidiabetic regimen, with persistent lows during the day especially if she delays lunch. She has high sugars in am as she eats a snack at night if sugars are in the 100s! Advised her to skip the snack, but will also decrease the dose of Lantus to avoid further lows. Will also move all Metformin dose at dinnertime to improve am sugars. Will give her a more flexible Novolog regimen.  - I suggested to:  Patient Instructions  Please move all Metformin (1000 mg) at dinnertime. Decrease Lantus to 20 units. Continue Novolog: - 6 units before a smaller meal - 8 units before a regular meal - 10 units before a large meal or if you have dessert  Try to skip the snack at night, if sugars >100.  Please return in 1.5 months with your sugar log.   - continue checking sugars at different times of the day - check 3 times a day, rotating checks - given more logs - advised for yearly eye exams  >> she is UTD - she has severe DR - checked HbA1c today >> higher: 8.6% - Return to clinic in 1.5 mo with sugar log

## 2016-02-09 NOTE — Patient Instructions (Signed)
Please move all Metformin (1000 mg) at dinnertime. Decrease Lantus to 20 units. Continue Novolog: - 6 units before a smaller meal - 8 units before a regular meal - 10 units before a large meal or if you have dessert  Try to skip the snack at night, if sugars >100.  Please return in 1.5 months with your sugar log.

## 2016-02-15 ENCOUNTER — Ambulatory Visit
Admission: RE | Admit: 2016-02-15 | Discharge: 2016-02-15 | Disposition: A | Discharge status: Home / Self Care | Payer: Medicare Other | Source: Ambulatory Visit

## 2016-02-15 DIAGNOSIS — Z1231 Encounter for screening mammogram for malignant neoplasm of breast: Secondary | ICD-10-CM | POA: Diagnosis not present

## 2016-02-22 ENCOUNTER — Other Ambulatory Visit: Payer: Self-pay | Admitting: Family Medicine

## 2016-03-01 ENCOUNTER — Ambulatory Visit: Payer: Medicare Other | Admitting: Internal Medicine

## 2016-03-03 DIAGNOSIS — E113512 Type 2 diabetes mellitus with proliferative diabetic retinopathy with macular edema, left eye: Secondary | ICD-10-CM | POA: Diagnosis not present

## 2016-03-03 DIAGNOSIS — Z961 Presence of intraocular lens: Secondary | ICD-10-CM | POA: Diagnosis not present

## 2016-03-03 DIAGNOSIS — E113511 Type 2 diabetes mellitus with proliferative diabetic retinopathy with macular edema, right eye: Secondary | ICD-10-CM | POA: Diagnosis not present

## 2016-03-06 ENCOUNTER — Telehealth: Payer: Self-pay | Admitting: *Deleted

## 2016-03-06 ENCOUNTER — Other Ambulatory Visit: Payer: Self-pay | Admitting: Internal Medicine

## 2016-03-06 MED ORDER — "INSULIN SYRINGE-NEEDLE U-100 30G X 1/2"" 0.5 ML MISC": Status: DC

## 2016-03-06 NOTE — Telephone Encounter (Signed)
Receive call pt is requesting refill on insulin syringes. [facility]ified [facility].../lmb

## 2016-03-08 ENCOUNTER — Other Ambulatory Visit: Payer: Self-pay | Admitting: Internal Medicine

## 2016-03-17 ENCOUNTER — Ambulatory Visit (INDEPENDENT_AMBULATORY_CARE_PROVIDER_SITE_OTHER): Payer: Medicare Other | Admitting: Internal Medicine

## 2016-03-17 ENCOUNTER — Encounter: Payer: Self-pay | Admitting: Internal Medicine

## 2016-03-17 ENCOUNTER — Encounter: Payer: Medicare Other | Admitting: Internal Medicine

## 2016-03-17 VITALS — BP 162/70 | HR 80 | Temp 98.3°F | Resp 16 | Wt 185.0 lb

## 2016-03-17 DIAGNOSIS — I10 Essential (primary) hypertension: Secondary | ICD-10-CM

## 2016-03-17 DIAGNOSIS — N183 Chronic kidney disease, stage 3 unspecified: Secondary | ICD-10-CM

## 2016-03-17 DIAGNOSIS — E113419 Type 2 diabetes mellitus with severe nonproliferative diabetic retinopathy with macular edema, unspecified eye: Secondary | ICD-10-CM | POA: Diagnosis not present

## 2016-03-17 DIAGNOSIS — IMO0002 Reserved for concepts with insufficient information to code with codable children: Secondary | ICD-10-CM

## 2016-03-17 DIAGNOSIS — E1165 Type 2 diabetes mellitus with hyperglycemia: Secondary | ICD-10-CM

## 2016-03-17 DIAGNOSIS — I5032 Chronic diastolic (congestive) heart failure: Secondary | ICD-10-CM

## 2016-03-17 DIAGNOSIS — E785 Hyperlipidemia, unspecified: Secondary | ICD-10-CM

## 2016-03-17 DIAGNOSIS — Z794 Long term (current) use of insulin: Secondary | ICD-10-CM

## 2016-03-17 NOTE — Progress Notes (Signed)
Subjective:    Patient ID: Wendy [name], female    DOB: 14-Jun-1941, 75 y.o.   MRN: [id number]  HPI No  show  Medications and allergies reviewed with patient and updated if appropriate.  Patient Active Problem List   Diagnosis Date Noted  . Osteopenia 01/05/2016  . 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 Seasonal allergies 12/01/2013  . Chronic diastolic heart failure ([place]) 07/06/2011  . TOBACCO USE 04/13/2008  . DM (diabetes mellitus), type 2, uncontrolled ([place]) 11/29/2006  . Hyperlipidemia 11/29/2006  . OBESITY, BMI 30-35 11/29/2006  . HYPERTENSION, BENIGN SYSTEMIC 11/29/2006    Current Outpatient Prescriptions on File Prior to Visit  Medication Sig Dispense Refill  . amLODipine (NORVASC) 10 MG tablet TAKE 1 TABLET (10 MG TOTAL) BY MOUTH DAILY. 30 tablet 10  . aspirin 81 MG tablet Take 81 mg by mouth daily.      . B-D INS SYRINGE 0.5CC/30GX1/2" 30G X 1/2" 0.5 ML MISC USE AS DIRECTED EVERY DAY 100 each 1  . Calcium Carbonate-Vit D-Min 600-400 MG-UNIT TABS Take 1 tablet by mouth daily.     . carvedilol (COREG) 25 MG tablet TAKE 1 TABLET BY MOUTH TWICE A DAY WITH A MEAL 90 tablet 3  . cloNIDine (CATAPRES) 0.3 MG tablet TAKE 1 TABLET (0.3 MG TOTAL) BY MOUTH 2 (TWO) TIMES DAILY. 60 tablet 3  . [facility] SENNA PLUS 8.6-50 MG per tablet TAKE 1 TABLET BY MOUTH AT BEDTIME. 30 tablet 3  . hydrochlorothiazide (HYDRODIURIL) 25 MG tablet TAKE 1 TABLET (25 MG TOTAL) BY MOUTH DAILY. 30 tablet 10  . insulin aspart (NOVOLOG) 100 UNIT/ML injection Inject 6-10 Units into the skin 3 (three) times daily before meals. 2 vial 5  . insulin glargine (LANTUS) 100 UNIT/ML injection INJECT 0.20 MLS (20 UNITS TOTAL) INTO THE SKIN EVERY MORNING. 10 mL 3  . lisinopril (PRINIVIL,ZESTRIL) 40 MG tablet TAKE 1 TABLET (40 MG TOTAL) BY MOUTH DAILY. 30 tablet 5  . metFORMIN (GLUCOPHAGE) 500 MG tablet Take  2 tablets (1,000 mg total) by mouth daily with supper. 180 tablet 6  . rosuvastatin (CRESTOR) 5 MG tablet Take 1 tablet (5 mg total) by mouth daily. --- Have labs redrawn in 8-10 weeks 90 tablet 1   No current facility-administered medications on file prior to visit.    Past Medical History  Diagnosis Date  . BREAST MASS, LEFT 04/13/2008    Per note 04/13/2008: U/S guided core needle biopsy of left breast on 02/21/08. Benign per pathologist read but was referred to Dr. Brantley Stage for surgical excision and patient reports this was excised.    . FRACTURE, TOE, RIGHT 10/08/2007  . SYSTOLIC MURMUR Q000111Q  . Heartburn 11/29/2006    Qualifier: Diagnosis of  By: Herma Ard    . ECZEMA, ATOPIC DERMATITIS 11/29/2006    Qualifier: Diagnosis of  By: Herma Ard    . Colon polyps   . DM (diabetes mellitus) ([place])   . HLD (hyperlipidemia)   . HTN (hypertension)     Past Surgical History  Procedure Laterality Date  . Breast lumpectomy Left     Social History   Social History  . Marital Status: Married    Spouse Name: N/A  . Number of Children: 4  . Years of Education: N/A   Occupational History  . DAY CARE    Social History Main Topics  . Smoking status: Never Smoker   .  Smokeless tobacco: Former Systems developer    Types: Snuff    Quit date: 10/02/2014     Comment: Quit 10/02/2014  previous 1 box per week of snuff   . Alcohol Use: No  . Drug Use: No  . Sexual Activity: Not on file   Other Topics Concern  . Not on file   Social History Narrative    Family History  Problem Relation Age of Onset  . Diabetes Sister     x 4  . Diabetes Brother   . Diabetes Paternal Grandmother   . Diabetes Paternal Aunt   . Heart disease Brother   . Heart disease Sister   . Emphysema Brother     Review of Systems     Objective:  There were no vitals filed for this visit. There were no vitals filed for this visit. There is no weight on file to calculate BMI.   Physical  Exam         Assessment & Plan:    This encounter was created in error - please disregard.

## 2016-03-17 NOTE — Assessment & Plan Note (Signed)
Cholesterol has been well controlled Continue Crestor 5 mg daily

## 2016-03-17 NOTE — Assessment & Plan Note (Signed)
Management per endocrine Continue better compliance with a diabetic diet Work on increasing exercise and weight loss

## 2016-03-17 NOTE — Progress Notes (Signed)
Pre visit review using our clinic review tool, if applicable. No additional management support is needed unless otherwise documented below in the visit note. 

## 2016-03-17 NOTE — Patient Instructions (Addendum)
  Test(s) ordered today. Your results will be released to MyChart (or called to you) after review, usually within 72hours after test completion. If any changes need to be made, you will be notified at that same time.  Medications reviewed and updated.  No changes recommended at this time.    Please followup in 6 months   

## 2016-03-17 NOTE — Assessment & Plan Note (Signed)
euvolemic on exam Continue current medications Encouraged regular exercise and weight loss

## 2016-03-17 NOTE — Progress Notes (Signed)
Subjective:    Patient ID: Wendy [name], female    DOB: 10-Nov-1940, 75 y.o.   MRN: [id number]  HPI She is here for follow up.  Diabetes: She is following with [facility].  She is taking her medication daily as prescribed. She is trying to be more compliant with a diabetic diet. She is trying to walk for exercise - last week she did it twice.  She is not yet exercising regularly. She wants to get a bike so that she can start biking. She does enjoy exercising.  Hypertension, chronic diastolic dysfunction: She is taking her medication daily, but recently ran out of her lisinopril and has not taken it for a few days. She believes that is why her blood pressure is elevated. Since her blood pressure has been elevated her ankles have been slightly swollen. She is fairly compliant with a low sodium diet.  She denies chest pain, palpitations,  shortness of breath and regular headaches. She is trying to exercise regularly.  She does not monitor her blood pressure at home.    Hyperlipidemia: She is taking her medication daily. She is compliant with a low fat/cholesterol diet. She is not exercising regularly. She denies myalgias.   CKD:  She tries to drink a lot of water.  She does not take advil.    She sometimes falls asleep when she is watching TV.  Some night she gets 6 hours of sleep and sometimes she is not able to sleep.  She does find her sleep refreshing.  She does snore.  There are no witnessed apnea.  She denies morning headaches.    She does care for her husband and that keep her very busy and it causes stress.  He is in a wheelchair.    Medications and allergies reviewed with patient and updated if appropriate.  Patient Active Problem List   Diagnosis Date Noted  . CKD (chronic kidney disease) stage 3, GFR 30-59 ml/min 03/17/2016  . Osteopenia 01/05/2016  . 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 Seasonal allergies 12/01/2013  . Chronic diastolic heart failure ([place]) 07/06/2011  . TOBACCO USE 04/13/2008  . Hyperlipidemia 11/29/2006  . OBESITY, BMI 30-35 11/29/2006  . HYPERTENSION, BENIGN SYSTEMIC 11/29/2006    Current Outpatient Prescriptions on File Prior to Visit  Medication Sig Dispense Refill  . amLODipine (NORVASC) 10 MG tablet TAKE 1 TABLET (10 MG TOTAL) BY MOUTH DAILY. 30 tablet 10  . aspirin 81 MG tablet Take 81 mg by mouth daily.      . B-D INS SYRINGE 0.5CC/30GX1/2" 30G X 1/2" 0.5 ML MISC USE AS DIRECTED EVERY DAY 100 each 1  . Calcium Carbonate-Vit D-Min 600-400 MG-UNIT TABS Take 1 tablet by mouth daily.     . carvedilol (COREG) 25 MG tablet TAKE 1 TABLET BY MOUTH TWICE A DAY WITH A MEAL 90 tablet 3  . cloNIDine (CATAPRES) 0.3 MG tablet TAKE 1 TABLET (0.3 MG TOTAL) BY MOUTH 2 (TWO) TIMES DAILY. 60 tablet 3  . [facility] SENNA PLUS 8.6-50 MG per tablet TAKE 1 TABLET BY MOUTH AT BEDTIME. 30 tablet 3  . hydrochlorothiazide (HYDRODIURIL) 25 MG tablet TAKE 1 TABLET (25 MG TOTAL) BY MOUTH DAILY. 30 tablet 10  . insulin aspart (NOVOLOG) 100 UNIT/ML injection Inject 6-10 Units into the skin 3 (three) times daily before meals. 2 vial 5  . insulin glargine (LANTUS) 100 UNIT/ML injection INJECT  0.20 MLS (20 UNITS TOTAL) INTO THE SKIN EVERY MORNING. 10 mL 3  . lisinopril (PRINIVIL,ZESTRIL) 40 MG tablet TAKE 1 TABLET (40 MG TOTAL) BY MOUTH DAILY. 30 tablet 5  . metFORMIN (GLUCOPHAGE) 500 MG tablet Take 2 tablets (1,000 mg total) by mouth daily with supper. 180 tablet 6  . rosuvastatin (CRESTOR) 5 MG tablet Take 1 tablet (5 mg total) by mouth daily. --- Have labs redrawn in 8-10 weeks 90 tablet 1   No current facility-administered medications on file prior to visit.    Past Medical History  Diagnosis Date  . BREAST MASS, LEFT 04/13/2008    Per note 04/13/2008: U/S guided core needle biopsy of left breast on 02/21/08. Benign per pathologist read but was referred to  Dr. Brantley Stage for surgical excision and patient reports this was excised.    . FRACTURE, TOE, RIGHT 10/08/2007  . SYSTOLIC MURMUR Q000111Q  . Heartburn 11/29/2006    Qualifier: Diagnosis of  By: Herma Ard    . ECZEMA, ATOPIC DERMATITIS 11/29/2006    Qualifier: Diagnosis of  By: Herma Ard    . Colon polyps   . DM (diabetes mellitus) ([place])   . HLD (hyperlipidemia)   . HTN (hypertension)     Past Surgical History  Procedure Laterality Date  . Breast lumpectomy Left     Social History   Social History  . Marital Status: Married    Spouse Name: N/A  . Number of Children: 4  . Years of Education: N/A   Occupational History  . DAY CARE    Social History Main Topics  . Smoking status: Never Smoker   . Smokeless tobacco: Former Systems developer    Types: Snuff    Quit date: 10/02/2014     Comment: Quit 10/02/2014  previous 1 box per week of snuff   . Alcohol Use: No  . Drug Use: No  . Sexual Activity: Not on file   Other Topics Concern  . Not on file   Social History Narrative    Family History  Problem Relation Age of Onset  . Diabetes Sister     x 4  . Diabetes Brother   . Diabetes Paternal Grandmother   . Diabetes Paternal Aunt   . Heart disease Brother   . Heart disease Sister   . Emphysema Brother     Review of Systems  Constitutional: Negative for fever and chills.  Respiratory: Negative for cough, shortness of breath and wheezing.   Cardiovascular: Positive for leg swelling (mild). Negative for chest pain and palpitations.  Neurological: Positive for headaches (occasional). Negative for dizziness and light-headedness.       Objective:   Filed Vitals:   03/17/16 1125  BP: 162/70  Pulse: 80  Temp: 98.3 F (36.8 C)  Resp: 16   Filed Weights   03/17/16 1125  Weight: 185 lb (83.915 kg)   Body mass index is 33.83 kg/(m^2).   Physical Exam Constitutional: Appears well-developed and well-nourished. No distress.  Neck: Neck supple. No tracheal  deviation present. No thyromegaly present.  No carotid bruit. No cervical adenopathy.   Cardiovascular: Normal rate, regular rhythm and normal heart sounds.   No murmur heard.  trace edema Pulmonary/Chest: Effort normal and breath sounds normal. No respiratory distress. No wheezes.       Assessment & Plan:   See Problem List for Assessment and Plan of chronic medical problems.

## 2016-03-17 NOTE — Assessment & Plan Note (Signed)
Check CMP Stressed good control of blood pressure and diabetes

## 2016-03-17 NOTE — Assessment & Plan Note (Signed)
She ran out of her lisinopril and has not taken it for a few days, which is likely why her blood pressure is elevated Blood pressure has typically been well controlled Continue current medication cmp

## 2016-03-22 ENCOUNTER — Ambulatory Visit: Payer: Medicare Other | Admitting: Internal Medicine

## 2016-04-06 ENCOUNTER — Telehealth: Payer: Self-pay | Admitting: Emergency Medicine

## 2016-04-06 ENCOUNTER — Ambulatory Visit: Payer: Medicare Other | Admitting: Internal Medicine

## 2016-04-06 MED ORDER — HYDROCHLOROTHIAZIDE 25 MG PO TABS: ORAL_TABLET | ORAL | Status: DC

## 2016-04-06 NOTE — Telephone Encounter (Signed)
Patients daughter called and patient needs prescription refill for hydrochlorothiazide (HYDRODIURIL) 25 MG tablet. [facility] [place]. Thanks.

## 2016-04-06 NOTE — Telephone Encounter (Signed)
RX sent to [facility] 

## 2016-04-14 DIAGNOSIS — Z961 Presence of intraocular lens: Secondary | ICD-10-CM | POA: Diagnosis not present

## 2016-04-14 DIAGNOSIS — E113512 Type 2 diabetes mellitus with proliferative diabetic retinopathy with macular edema, left eye: Secondary | ICD-10-CM | POA: Diagnosis not present

## 2016-04-14 DIAGNOSIS — E113511 Type 2 diabetes mellitus with proliferative diabetic retinopathy with macular edema, right eye: Secondary | ICD-10-CM | POA: Diagnosis not present

## 2016-04-15 ENCOUNTER — Telehealth: Payer: Self-pay | Admitting: Internal Medicine

## 2016-04-15 MED ORDER — METFORMIN HCL 500 MG PO TABS: 1000.0000 mg | ORAL_TABLET | Freq: Every day | ORAL | Status: DC

## 2016-04-15 NOTE — Telephone Encounter (Signed)
Patient called Saturday clinic is out of Metformin. Can Rx be sent to [facility]?

## 2016-04-15 NOTE — Telephone Encounter (Signed)
Done and pt notified (Dr. Cruzita Lederer put no print instead of normal when refilling med last time)

## 2016-04-25 ENCOUNTER — Ambulatory Visit: Payer: Medicare Other | Admitting: Internal Medicine

## 2016-05-03 ENCOUNTER — Other Ambulatory Visit: Payer: Self-pay | Admitting: Internal Medicine

## 2016-05-03 DIAGNOSIS — I10 Essential (primary) hypertension: Secondary | ICD-10-CM

## 2016-05-10 DIAGNOSIS — E113512 Type 2 diabetes mellitus with proliferative diabetic retinopathy with macular edema, left eye: Secondary | ICD-10-CM | POA: Diagnosis not present

## 2016-05-10 DIAGNOSIS — Z961 Presence of intraocular lens: Secondary | ICD-10-CM | POA: Diagnosis not present

## 2016-05-10 DIAGNOSIS — E113511 Type 2 diabetes mellitus with proliferative diabetic retinopathy with macular edema, right eye: Secondary | ICD-10-CM | POA: Diagnosis not present

## 2016-05-23 DIAGNOSIS — Z961 Presence of intraocular lens: Secondary | ICD-10-CM | POA: Diagnosis not present

## 2016-05-23 DIAGNOSIS — E113512 Type 2 diabetes mellitus with proliferative diabetic retinopathy with macular edema, left eye: Secondary | ICD-10-CM | POA: Diagnosis not present

## 2016-05-23 DIAGNOSIS — E113511 Type 2 diabetes mellitus with proliferative diabetic retinopathy with macular edema, right eye: Secondary | ICD-10-CM | POA: Diagnosis not present

## 2016-05-26 ENCOUNTER — Other Ambulatory Visit: Payer: Self-pay | Admitting: Internal Medicine

## 2016-05-27 ENCOUNTER — Other Ambulatory Visit: Payer: Self-pay | Admitting: Internal Medicine

## 2016-05-31 NOTE — Telephone Encounter (Signed)
Called pt back inform her the pharmacy did send request, but since she see Dr. Cruzita Lederer for her diabetes she will be the one to refill the Novolog. Pt states she has no insulin. Inform pt will send 1 refill in until she see Dr. Cruzita Lederer. Pt states she has an appt tomorrow or Fri with her and will get future refills from specialist.../lmb

## 2016-05-31 NOTE — Telephone Encounter (Signed)
Pt left msg on triage stating she has been trying to get refill for a week now on her Novolog. She is completley out. Requesting call back...Johny Chess

## 2016-06-01 ENCOUNTER — Ambulatory Visit: Payer: Medicare Other | Admitting: Internal Medicine

## 2016-06-06 ENCOUNTER — Ambulatory Visit: Payer: Medicare Other | Admitting: Internal Medicine

## 2016-06-06 ENCOUNTER — Encounter: Payer: Self-pay | Admitting: Internal Medicine

## 2016-06-06 ENCOUNTER — Ambulatory Visit (INDEPENDENT_AMBULATORY_CARE_PROVIDER_SITE_OTHER): Payer: Medicare Other | Admitting: Internal Medicine

## 2016-06-06 ENCOUNTER — Other Ambulatory Visit: Payer: Self-pay | Admitting: Internal Medicine

## 2016-06-06 VITALS — BP 130/82 | HR 79 | Ht 61.5 in | Wt 187.0 lb

## 2016-06-06 DIAGNOSIS — E113419 Type 2 diabetes mellitus with severe nonproliferative diabetic retinopathy with macular edema, unspecified eye: Secondary | ICD-10-CM | POA: Diagnosis not present

## 2016-06-06 DIAGNOSIS — E1165 Type 2 diabetes mellitus with hyperglycemia: Secondary | ICD-10-CM

## 2016-06-06 DIAGNOSIS — Z794 Long term (current) use of insulin: Secondary | ICD-10-CM | POA: Diagnosis not present

## 2016-06-06 DIAGNOSIS — IMO0002 Reserved for concepts with insufficient information to code with codable children: Secondary | ICD-10-CM

## 2016-06-06 MED ORDER — INSULIN GLARGINE 100 UNIT/ML ~~LOC~~ SOLN: SUBCUTANEOUS | 3 refills | Status: DC

## 2016-06-06 MED ORDER — INSULIN ASPART 100 UNIT/ML ~~LOC~~ SOLN: SUBCUTANEOUS | 3 refills | Status: DC

## 2016-06-06 MED ORDER — INSULIN SYRINGE-NEEDLE U-100 30G X 1/2" 0.5 ML MISC
3 refills | Status: DC
Start: 2016-06-06 — End: 2016-08-15

## 2016-06-06 NOTE — Patient Instructions (Addendum)
Please move: - Metformin (1000 mg) at dinnertime.  Increase: - Lantus to 24 units in am - Novolog: - 6 units before a smaller meal - 8 units before a regular meal - 10 units before a large meal or if you have dessert  Try to skip the snack at night, if sugars >100.  Please return in 1.5 months with your sugar log.

## 2016-06-06 NOTE — Progress Notes (Signed)
Patient ID: ZIONNAH CASTANOS, female   DOB: 1941-07-23, 75 y.o.   MRN: [id number]  HPI: ANETA FIEST is a 75 y.o.-year-old female, returning for f/u for DM2, dx in 78s, insulin-dependent since 1990, uncontrolled, with complications (CKD, DR). Last visit 4 mo ago.  Last hemoglobin A1c was: Lab Results  Component Value Date   HGBA1C 8.6 02/09/2016   HGBA1C 8.2 (H) 10/20/2015   HGBA1C 8.5 (H) 07/13/2015   Pt was on a regimen of: - Metformin 500 mg 2x a day with meals. - Lantus 24 units in am - NovoLog doses 2x a day as follows 10 (rarely 12 units) units with a smaller meal She stopped insulin with lunch as she was getting too low (70-80).  Now on: - Metformin (1000 mg) at dinnertime. - Lantus 20 units at bedtime - Novolog: - 6 units before a smaller meal - 8 units before a regular meal - 10 units before a large meal or if you have dessert  Pt checks her sugars 1-2 a day: - am: 92-254 >> 130, 202-242 >> 173-240 (eats snack at night) >> 149-240  (eats snack at night) - 2h after b'fast: 149, 210-261, 318 >> n/c >> n/c - before lunch: 115-237 >> 160, 229 >> 40 x1, 60 x1 (delayed lunch), 112, 154 >> 52, 231, 250 - 2h after lunch: 154-237 >> 200 >> n/c >> 200, 203 - before dinner: 171-277 >> 249, 254, 400 >> n/c >> 194 - 2h after dinner: 284 >> 143 >> n/c >> 174, 302 - bedtime: 189-254 >> 103 >> 60x1 >> 200 - nighttime: 185-265 >> 32!x1, 117x2 >> 132 >> 244-290 No lows. Lowest sugar was 32 >> 40 (delayed meal) >> 52 x1; she has hypoglycemia awareness at 60.  Highest sugar was 342 >> 400x1 >> 200s >> 302  Glucometer: [employer]  Pt's meals are: - Breakfast: toast, egg, bacon/sausage; stopped cereals; oatmeal  - Lunch: [place] sandwich, soups, crackers - Dinner: baked chicken, potatoes, cabbage (largest) - Snacks: PB and crackers  - + CKD, last BUN/creatinine:  Lab Results  Component Value Date   BUN 28 (H) 10/20/2015   CREATININE 1.29 (H) 10/20/2015  On Lisinopril. - last set  of lipids: Lab Results  Component Value Date   CHOL 165 10/20/2015   HDL 50.20 10/20/2015   LDLCALC 92 10/20/2015   LDLDIRECT 91 10/25/2012   TRIG 112.0 10/20/2015   CHOLHDL 3 10/20/2015  On Crestor 5 >> off now b/c mm pain in arm.. - last eye exam was on 05/2016. 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 She had a Laser tx and  - no numbness and tingling in her feet. + pain in feet. On ASA 81.  She is a caregiver for her husband, who is in a wheelchair.   ROS: Constitutional: no weight gain/loss, no fatigue, no subjective hyperthermia/hypothermia, + nocturia Eyes: no blurry vision, no xerophthalmia ENT: no sore throat, no nodules palpated in throat, no dysphagia/odynophagia, no hoarseness Cardiovascular: no CP/SOB/palpitations/leg swelling Respiratory: no cough/SOB Gastrointestinal: no N/V/D/+ C Musculoskeletal: no muscle/joint aches Skin: no rashes Neurological: no tremors/numbness/tingling/dizziness  I reviewed pt's medications, allergies, PMH, social hx, family hx, and changes were documented in the history of present illness. Otherwise, unchanged from my initial visit note.  Past Medical History:  Diagnosis Date  . BREAST MASS, LEFT 04/13/2008   Per note 04/13/2008: U/S guided core needle biopsy of left breast on 02/21/08. Benign per pathologist read but was referred to Dr. Brantley Stage for surgical excision and  patient reports this was excised.    . Colon polyps   . DM (diabetes mellitus) ([place])   . 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 FRACTURE, TOE, RIGHT 10/08/2007  . Heartburn 11/29/2006   Qualifier: Diagnosis of  By: Herma Ard    . HLD (hyperlipidemia)   . HTN (hypertension)   . SYSTOLIC MURMUR [id number]   Past Surgical History:  Procedure Laterality Date  . BREAST LUMPECTOMY Left    Social History   Social History  . Marital Status: Married    Spouse Name: N/A  . Number of Children: 4   . retired    Social  History Main Topics  . Smoking status: Never Smoker   . Smokeless tobacco: Former Systems developer    Types: Snuff    Quit date: 10/02/2014     Comment: Quit 10/02/2014  previous 1 box per week of snuff   . Alcohol Use: No  . Drug Use: No   Current Outpatient Prescriptions on File Prior to Visit  Medication Sig Dispense Refill  . amLODipine (NORVASC) 10 MG tablet TAKE 1 TABLET (10 MG TOTAL) BY MOUTH DAILY. 30 tablet 10  . aspirin 81 MG tablet Take 81 mg by mouth daily.      . B-D INS SYRINGE 0.5CC/30GX1/2" 30G X 1/2" 0.5 ML MISC USE AS DIRECTED 4 TIMES A DAY 100 each 1  . Calcium Carbonate-Vit D-Min 600-400 MG-UNIT TABS Take 1 tablet by mouth daily.     . carvedilol (COREG) 25 MG tablet TAKE 1 TABLET BY MOUTH TWICE A DAY WITH A MEAL 90 tablet 3  . cloNIDine (CATAPRES) 0.3 MG tablet TAKE 1 TABLET BY MOUTH 2 TIMES DAILY. 180 tablet 1  . [facility] SENNA PLUS 8.6-50 MG per tablet TAKE 1 TABLET BY MOUTH AT BEDTIME. 30 tablet 3  . hydrochlorothiazide (HYDRODIURIL) 25 MG tablet TAKE 1 TABLET (25 MG TOTAL) BY MOUTH DAILY. 30 tablet 5  . insulin aspart (NOVOLOG) 100 UNIT/ML injection Inject 6-10 Units into the skin 3 (three) times daily before meals. 2 vial 5  . insulin glargine (LANTUS) 100 UNIT/ML injection INJECT 0.20 MLS (20 UNITS TOTAL) INTO THE SKIN EVERY MORNING. 10 mL 3  . lisinopril (PRINIVIL,ZESTRIL) 40 MG tablet TAKE 1 TABLET (40 MG TOTAL) BY MOUTH DAILY. 30 tablet 5  . metFORMIN (GLUCOPHAGE) 500 MG tablet Take 2 tablets (1,000 mg total) by mouth daily with supper. 180 tablet 3  . NOVOLOG 100 UNIT/ML injection INJECT 10 UNITS INTO THE SKIN 3 (THREE) TIMES DAILY WITH MEALS. DISPENSE: QUANTITY FOR 1-MTH SUPPLY 10 mL 0  . rosuvastatin (CRESTOR) 5 MG tablet Take 1 tablet (5 mg total) by mouth daily. --- Have labs redrawn in 8-10 weeks 90 tablet 1   No current facility-administered medications on file prior to visit.    No Known Allergies Family History  Problem Relation Age of Onset  . Diabetes Sister      x 4  . Diabetes Brother   . Diabetes Paternal Grandmother   . Diabetes Paternal Aunt   . Heart disease Brother   . Heart disease Sister   . Emphysema Brother    PE: BP 130/82 (BP Location: Left Arm, Patient Position: Sitting)   Pulse 79   Ht 5' 1.5" (1.562 m)   Wt 187 lb (84.8 kg)   SpO2 96%   BMI 34.76 kg/m  Body mass index is 34.76 kg/m. Wt Readings from Last 3 Encounters:  06/06/16 187 lb (84.8 kg)  03/17/16 185 lb (83.9 kg)  02/09/16 190 lb 6.4 oz (86.4 kg)   Constitutional: overweight, in NAD Eyes: PERRLA, EOMI, no exophthalmos ENT: moist mucous membranes, no thyromegaly, no cervical lymphadenopathy Cardiovascular: RRR, No MRG Respiratory: CTA B Gastrointestinal: abdomen soft, NT, ND, BS+ Musculoskeletal: no deformities, strength intact in all 4 Skin: moist, warm, no rashes Neurological: no tremor with outstretched hands, DTR normal in all 4  ASSESSMENT: 1. DM2, insulin-dependent, uncontrolled, with complications - + DR (severe, non-proliferative)  PLAN:  1. Patient with long-standing, uncontrolled diabetes, on basal-bolus insulin regimen and Metformin, with higher sugars at this visit. She ran out of Lantus 2 days ago, but sugars have been high even before this. She has high sugars in am as she eats a snack at night if sugars are in the 100s! Again advised her to skip the snack. Will increase the dose of Lantus back. Will again advised her to move all Metformin dose at dinnertime to improve am sugars. Will also increase the Novolog doses wit a regular meal. - I suggested to:  Patient Instructions  Please move: - Metformin (1000 mg) at dinnertime.  Increase: - Lantus to 24 units in am - Novolog: - 6 units before a smaller meal - 8 units before a regular meal - 10 units before a large meal or if you have dessert  Try to skip the snack at night, if sugars >100.  Please return in 1.5 months with your sugar log.   - continue checking sugars at different times of  the day - check 3 times a day, rotating checks - given more logs - advised for yearly eye exams  >> she is UTD - she has severe DR - checked HbA1c today >> 9.4% (higher!) - Return to clinic in 1.5 mo with sugar log    Philemon Kingdom, MD PhD [facility] Endocrinology

## 2016-06-07 LAB — POCT GLYCOSYLATED HEMOGLOBIN (HGB A1C): Hemoglobin A1C: 9.4

## 2016-06-07 NOTE — Addendum Note (Signed)
Addended by: Caprice Beaver T on: 06/07/2016 08:02 AM   Modules accepted: Orders

## 2016-06-08 ENCOUNTER — Telehealth: Payer: Self-pay | Admitting: Emergency Medicine

## 2016-06-08 NOTE — Telephone Encounter (Signed)
[facility] called and wants to know if they can get a 90 day supply on her insulin glargine (LANTUS) 100 UNIT/ML injection and insulin aspart (NOVOLOG) 100 UNIT/ML injection. Please advise thanks.

## 2016-06-08 NOTE — Telephone Encounter (Signed)
[facility] States pt already picked up RX and it was ordered correctly.

## 2016-06-30 DIAGNOSIS — E113511 Type 2 diabetes mellitus with proliferative diabetic retinopathy with macular edema, right eye: Secondary | ICD-10-CM | POA: Diagnosis not present

## 2016-07-26 DIAGNOSIS — H35033 Hypertensive retinopathy, bilateral: Secondary | ICD-10-CM | POA: Diagnosis not present

## 2016-07-26 DIAGNOSIS — E113512 Type 2 diabetes mellitus with proliferative diabetic retinopathy with macular edema, left eye: Secondary | ICD-10-CM | POA: Diagnosis not present

## 2016-07-26 DIAGNOSIS — E113511 Type 2 diabetes mellitus with proliferative diabetic retinopathy with macular edema, right eye: Secondary | ICD-10-CM | POA: Diagnosis not present

## 2016-07-31 ENCOUNTER — Encounter: Payer: Self-pay | Admitting: *Deleted

## 2016-07-31 NOTE — Telephone Encounter (Signed)
This encounter was created in error - please disregard.

## 2016-08-04 ENCOUNTER — Other Ambulatory Visit: Payer: Self-pay | Admitting: Internal Medicine

## 2016-08-04 DIAGNOSIS — E785 Hyperlipidemia, unspecified: Secondary | ICD-10-CM

## 2016-08-06 ENCOUNTER — Other Ambulatory Visit: Payer: Self-pay | Admitting: Internal Medicine

## 2016-08-08 ENCOUNTER — Ambulatory Visit: Payer: Medicare Other | Admitting: Internal Medicine

## 2016-08-09 ENCOUNTER — Other Ambulatory Visit: Payer: Self-pay | Admitting: Family Medicine

## 2016-08-10 ENCOUNTER — Other Ambulatory Visit: Payer: Self-pay | Admitting: Internal Medicine

## 2016-08-14 ENCOUNTER — Telehealth: Payer: Self-pay | Admitting: Internal Medicine

## 2016-08-14 NOTE — Telephone Encounter (Signed)
Pt needs [facility] [address]

## 2016-08-15 ENCOUNTER — Other Ambulatory Visit: Payer: Self-pay

## 2016-08-15 MED ORDER — "INSULIN SYRINGE-NEEDLE U-100 30G X 1/2"" 0.5 ML MISC": 3 refills | Status: AC

## 2016-08-15 NOTE — Telephone Encounter (Signed)
DONE

## 2016-09-08 ENCOUNTER — Other Ambulatory Visit: Payer: Self-pay

## 2016-09-14 ENCOUNTER — Encounter: Payer: Medicare Other | Admitting: Internal Medicine

## 2016-09-19 ENCOUNTER — Encounter: Payer: Medicare Other | Admitting: Internal Medicine

## 2016-09-19 NOTE — Progress Notes (Signed)
This encounter was created in error - please disregard.

## 2016-09-19 NOTE — Progress Notes (Signed)
Subjective:    Patient ID: Wendy [name], female    DOB: 1941/03/26, 75 y.o.   MRN: [id number]  HPI No show       Medications and allergies reviewed with patient and updated if appropriate.  Patient Active Problem List   Diagnosis Date Noted  . CKD (chronic kidney disease) stage 3, GFR 30-59 ml/min 03/17/2016  . Osteopenia 01/05/2016  . 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 Seasonal allergies 12/01/2013  . Chronic diastolic heart failure ([place]) 07/06/2011  . TOBACCO USE 04/13/2008  . Hyperlipidemia 11/29/2006  . OBESITY, BMI 30-35 11/29/2006  . HYPERTENSION, BENIGN SYSTEMIC 11/29/2006    Current Outpatient Prescriptions on File Prior to Visit  Medication Sig Dispense Refill  . amLODipine (NORVASC) 10 MG tablet TAKE 1 TABLET (10 MG TOTAL) BY MOUTH DAILY. 30 tablet 5  . aspirin 81 MG tablet Take 81 mg by mouth daily.      . B-D INS SYRINGE 0.5CC/31GX5/16 31G X 5/16" 0.5 ML MISC USE AS DIRECTED 4 TIMES A DAY 300 each 1  . Calcium Carbonate-Vit D-Min 600-400 MG-UNIT TABS Take 1 tablet by mouth daily.     . carvedilol (COREG) 25 MG tablet TAKE 1 TABLET BY MOUTH TWICE A DAY WITH A MEAL 90 tablet 3  . cloNIDine (CATAPRES) 0.3 MG tablet TAKE 1 TABLET BY MOUTH 2 TIMES DAILY. 180 tablet 1  . [facility] SENNA PLUS 8.6-50 MG per tablet TAKE 1 TABLET BY MOUTH AT BEDTIME. 30 tablet 3  . hydrochlorothiazide (HYDRODIURIL) 25 MG tablet TAKE 1 TABLET (25 MG TOTAL) BY MOUTH DAILY. 30 tablet 5  . insulin aspart (NOVOLOG) 100 UNIT/ML injection Inject 6-10 Units into the skin 3 (three) times daily before meals. 2 vial 5  . insulin aspart (NOVOLOG) 100 UNIT/ML injection Inject under skin 6-10 units before the main 3 meals 30 mL 3  . insulin glargine (LANTUS) 100 UNIT/ML injection Inject 0.24 MLS (24 UNITS TOTAL) under skin in am 30 mL 3  . Insulin Syringe-Needle U-100 (B-D INS SYRINGE  0.5CC/30GX1/2") 30G X 1/2" 0.5 ML MISC USE AS DIRECTED 4 TIMES A DAY 300 each 3  . lisinopril (PRINIVIL,ZESTRIL) 40 MG tablet TAKE 1 TABLET (40 MG TOTAL) BY MOUTH DAILY. 30 tablet 5  . metFORMIN (GLUCOPHAGE) 500 MG tablet Take 2 tablets (1,000 mg total) by mouth daily with supper. 180 tablet 3  . rosuvastatin (CRESTOR) 5 MG tablet Take 1 tablet (5 mg total) by mouth daily. -- need labs for further refills 90 tablet 0   No current facility-administered medications on file prior to visit.     Past Medical History:  Diagnosis Date  . BREAST MASS, LEFT 04/13/2008   Per note 04/13/2008: U/S guided core needle biopsy of left breast on 02/21/08. Benign per pathologist read but was referred to Dr. Brantley Stage for surgical excision and patient reports this was excised.    . Colon polyps   . DM (diabetes mellitus) ([place])   . 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 FRACTURE, TOE, RIGHT 10/08/2007  . Heartburn 11/29/2006   Qualifier: Diagnosis of  By: Herma Ard    . HLD (hyperlipidemia)   . HTN (hypertension)   . SYSTOLIC MURMUR 96/29/5284    Past Surgical History:  Procedure Laterality Date  . BREAST LUMPECTOMY Left     Social History   Social  History  . Marital status: Married    Spouse name: N/A  . Number of children: 4  . Years of education: N/A   Occupational History  . [employer]   Social History Main Topics  . Smoking status: Never Smoker  . Smokeless tobacco: Former Systems developer    Types: Snuff    Quit date: 10/02/2014     Comment: Quit 10/02/2014  previous 1 box per week of snuff   . Alcohol use No  . Drug use: No  . Sexual activity: Not on file   Other Topics Concern  . Not on file   Social History Narrative  . No narrative on file    Family History  Problem Relation Age of Onset  . Diabetes Sister     x 4  . Diabetes Brother   . Diabetes Paternal Grandmother   . Diabetes Paternal Aunt   . Heart  disease Brother   . Heart disease Sister   . Emphysema Brother     Review of Systems     Objective:  There were no vitals filed for this visit. There were no vitals filed for this visit. There is no height or weight on file to calculate BMI.   Physical Exam         Assessment & Plan:        This encounter was created in error - please disregard.

## 2016-09-29 ENCOUNTER — Other Ambulatory Visit: Payer: Self-pay | Admitting: Internal Medicine

## 2016-09-29 DIAGNOSIS — Z1231 Encounter for screening mammogram for malignant neoplasm of breast: Secondary | ICD-10-CM

## 2016-10-01 ENCOUNTER — Other Ambulatory Visit: Payer: Self-pay | Admitting: Internal Medicine

## 2016-10-01 ENCOUNTER — Other Ambulatory Visit: Payer: Self-pay | Admitting: Family Medicine

## 2016-10-02 DIAGNOSIS — N6459 Other signs and symptoms in breast: Secondary | ICD-10-CM

## 2016-10-02 HISTORY — DX: Other signs and symptoms in breast: N64.59

## 2016-10-06 ENCOUNTER — Ambulatory Visit (INDEPENDENT_AMBULATORY_CARE_PROVIDER_SITE_OTHER): Payer: Medicare Other | Admitting: *Deleted

## 2016-10-06 VITALS — BP 172/102 | HR 90 | Resp 16 | Ht 62.0 in | Wt 181.2 lb

## 2016-10-06 DIAGNOSIS — Z23 Encounter for immunization: Secondary | ICD-10-CM

## 2016-10-06 DIAGNOSIS — Z0000 Encounter for general adult medical examination without abnormal findings: Secondary | ICD-10-CM | POA: Diagnosis not present

## 2016-10-06 DIAGNOSIS — Z794 Long term (current) use of insulin: Secondary | ICD-10-CM

## 2016-10-06 DIAGNOSIS — E113419 Type 2 diabetes mellitus with severe nonproliferative diabetic retinopathy with macular edema, unspecified eye: Secondary | ICD-10-CM

## 2016-10-06 DIAGNOSIS — I10 Essential (primary) hypertension: Secondary | ICD-10-CM | POA: Diagnosis not present

## 2016-10-06 DIAGNOSIS — E1165 Type 2 diabetes mellitus with hyperglycemia: Secondary | ICD-10-CM

## 2016-10-06 NOTE — Assessment & Plan Note (Signed)
H/o uncontrolled diabetes, followed by endo. Not taking insulin prescribed. Taking novolog 6 units in the morning only. Instructions given to pt per last visit w/ endo and encouraged pt to take meds as prescribed and schedule follow-up. She is checking CBG daily-'it's always 200.'

## 2016-10-06 NOTE — Assessment & Plan Note (Addendum)
BP uncontrolled. Pt reports compliance w/ medications and has taken medications today. She is essentially asymptomatic w/ today's readings, but does endorse occasional mild HA at night. Acute visit scheduled w/ provider in office for reassessment. Reviewed red flags that should prompt emergent assessment prior to scheduled appt.  BP Readings from Last 3 Encounters:  10/06/16 (!) 172/102  06/06/16 130/82  03/17/16 (!) 162/70

## 2016-10-06 NOTE — Patient Instructions (Addendum)
  Wendy [name] , Thank you for taking time to come for your Medicare Wellness Visit. I appreciate your ongoing commitment to your health goals. Please review the following plan we discussed and let me know if I can assist you in the future.   You got your flu shot today.  Please schedule follow-up appt w/ Dr. Cruzita Lederer for your diabetes. [phone]  Please take your insulin aspart (Novolog) as prescribed by Dr. Cruzita Lederer:  - Lantus to 24 units in am - Novolog: - 6 units before a smaller meal - 8 units before a regular meal - 10 units before a large meal or if you have dessert  These are the goals we discussed: Goals    . Blood Pressure < 140/90       This is a list of the screening recommended for you and due dates:  Health Maintenance  Topic Date Due  . Shingles Vaccine  06/29/2001  . Tetanus Vaccine  03/02/2009  . Colon Cancer Screening  08/02/2014  . Complete foot exam   10/14/2014  . Eye exam for diabetics  01/14/2016  . Flu Shot  05/02/2016  . Hemoglobin A1C  09/06/2016  . Lipid (cholesterol) test  10/19/2016  . DEXA scan (bone density measurement)  Completed  . Pneumonia vaccines  Completed

## 2016-10-06 NOTE — Progress Notes (Addendum)
Subjective:   BILLI BRIGHT is a 75 y.o. female who presents for an Initial Medicare Annual Wellness Visit.  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 This has been very stressful for her. She reports he has been sick for the last 30 years.   She also reports a lump in her L breast x2 months that is intermittently painful. She reports no nipple discharge or changes to surrounding skin. States she has h/o L breast cyst and underwent US w/ biopsy, which was negative. She has upcoming MMG scheduled 10/25/16.   Review of Systems    No ROS.  Medicare Wellness Visit.  Cardiac Risk Factors include: advanced age (>47men, >71 women);diabetes mellitus;dyslipidemia;hypertension;obesity (BMI >30kg/m2);sedentary lifestyle  Sleep patterns: no sleep issues, feels rested on waking, gets up 3 times nightly to void. Home Safety/Smoke Alarms: Feels safe in home. Smoke alarms in place.  Living environment; residence and Firearm Safety: Lives w/ son. 1-story house/ trailer, tub-shower, equipment: Hydrologist, Type: Tub Seat/Chair, no firearms. Seat Belt Safety/Bike Helmet: Wears seat belt.   Counseling:   Eye Exam- Dr. Posey Pronto at least once/year Dental- does not follow w/ dentist regularly  Female:   Pap- Aged out       Mammo- last 02/17/16. BI-RADS CATEGORY  1: Negative.       Dexa scan- last 12/30/15. Osteopenia. PCP recommended OTC calcium + vitamin D supplements and weight bearing exercise.        CCS- 08/12/2004 w/ Dr. Anson Fret. 1 polyp removed from rectum, otherwise normal.  Pathology showed no adenomatous change or malignancy. No follow-up on file, but pt is due per standard screening recommendations.    Objective:    Today's Vitals   10/06/16 1120 10/06/16 1149  BP: (!) 182/112 (!) 172/102  Pulse: 90   Resp: 16   SpO2: 99%   Weight: 181 lb 4 oz (82.2 kg)   Height: 5\' 2"  (1.575 m)    Body mass index is 33.15 kg/m.  BP  Readings from Last 3 Encounters:  10/06/16 (!) 172/102  06/06/16 130/82  03/17/16 (!) 162/70   Current Medications (verified) Outpatient Encounter Prescriptions as of 10/06/2016  Medication Sig  . amLODipine (NORVASC) 10 MG tablet TAKE 1 TABLET (10 MG TOTAL) BY MOUTH DAILY.  Marland Kitchen aspirin 81 MG tablet Take 81 mg by mouth daily.    . B-D INS SYRINGE 0.5CC/31GX5/16 31G X 5/16" 0.5 ML MISC USE AS DIRECTED 4 TIMES A DAY  . Calcium Carbonate-Vit D-Min 600-400 MG-UNIT TABS Take 1 tablet by mouth daily.   . carvedilol (COREG) 25 MG tablet TAKE 1 TABLET BY MOUTH TWICE A DAY WITH A MEAL  . cloNIDine (CATAPRES) 0.3 MG tablet TAKE 1 TABLET BY MOUTH 2 TIMES DAILY.  . hydrochlorothiazide (HYDRODIURIL) 25 MG tablet Take 1 tablet (25 mg total) by mouth daily. --- office visit needed for further refills.  . insulin aspart (NOVOLOG) 100 UNIT/ML injection Inject 6-10 Units into the skin 3 (three) times daily before meals.  . insulin aspart (NOVOLOG) 100 UNIT/ML injection Inject under skin 6-10 units before the main 3 meals  . insulin glargine (LANTUS) 100 UNIT/ML injection Inject 0.24 MLS (24 UNITS TOTAL) under skin in am  . Insulin Syringe-Needle U-100 (B-D INS SYRINGE 0.5CC/30GX1/2") 30G X 1/2" 0.5 ML MISC USE AS DIRECTED 4 TIMES A DAY  . lisinopril (PRINIVIL,ZESTRIL) 40 MG tablet TAKE 1 TABLET (40 MG TOTAL) BY MOUTH DAILY.  Marland Kitchen  metFORMIN (GLUCOPHAGE) 500 MG tablet Take 2 tablets (1,000 mg total) by mouth daily with supper.  . rosuvastatin (CRESTOR) 5 MG tablet Take 1 tablet (5 mg total) by mouth daily. -- need labs for further refills  . CVS SENNA PLUS 8.6-50 MG per tablet TAKE 1 TABLET BY MOUTH AT BEDTIME. (Patient not taking: Reported on 10/06/2016)   No facility-administered encounter medications on file as of 10/06/2016.     Allergies (verified) Patient has no known allergies.   History: Past Medical History:  Diagnosis Date  . BREAST MASS, LEFT 04/13/2008   Per note 04/13/2008: U/S guided core needle  biopsy of left breast on 02/21/08. Benign per pathologist read but was referred to Dr. Brantley Stage for surgical excision and patient reports this was excised.    . Colon polyps   . DM (diabetes mellitus) ([place])   . 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 FRACTURE, TOE, RIGHT 10/08/2007  . Heartburn 11/29/2006   Qualifier: Diagnosis of  By: Herma Ard    . HLD (hyperlipidemia)   . HTN (hypertension)   . SYSTOLIC MURMUR 82/50/5397   Past Surgical History:  Procedure Laterality Date  . BREAST LUMPECTOMY Left    Family History  Problem Relation Age of Onset  . Diabetes Sister     x 4  . Diabetes Brother   . Diabetes Paternal Grandmother   . Diabetes Paternal Aunt   . Heart disease Brother   . Heart disease Sister   . Emphysema Brother    Social History   Occupational History  . [employer]   Social History Main Topics  . Smoking status: Never Smoker  . Smokeless tobacco: Former Systems developer    Types: Snuff    Quit date: 10/02/2014     Comment: Quit 10/02/2014  previous 1 box per week of snuff   . Alcohol use No  . Drug use: No  . Sexual activity: Not Currently    Tobacco Counseling Counseling given: Not Answered   Activities of Daily Living In your present state of health, do you have any difficulty performing the following activities: 10/06/2016  Hearing? N  Vision? N  Difficulty concentrating or making decisions? N  Walking or climbing stairs? N  Dressing or bathing? N  Doing errands, shopping? N  Preparing Food and eating ? N  Using the Toilet? N  In the past six months, have you accidently leaked urine? Y  Do you have problems with loss of bowel control? Y  Managing your Medications? N  Managing your Finances? N  Housekeeping or managing your Housekeeping? N  Some recent data might be hidden    Immunizations and Health Maintenance Immunization History  Administered Date(s) Administered  .  Influenza Split 07/06/2011, 10/25/2012  . Influenza Whole 07/06/2008, 08/30/2009, 09/15/2010  . Influenza,inj,Quad PF,36+ Mos 07/03/2013, 06/16/2015, 10/06/2016  . Pneumococcal Conjugate-13 07/12/2015  . Pneumococcal Polysaccharide-23 08/03/1999, 12/12/2011  . Td 03/03/1999   Health Maintenance Due  Topic Date Due  . ZOSTAVAX  06/29/2001  . TETANUS/TDAP  03/02/2009  . COLONOSCOPY  08/02/2014  . FOOT EXAM  10/14/2014  . OPHTHALMOLOGY EXAM  01/14/2016  . HEMOGLOBIN A1C  09/06/2016    Patient Care Team: Binnie Rail, MD as PCP - General (Internal Medicine) Philemon Kingdom, MD as Consulting Physician (Internal Medicine) Jalene Mullet, MD as Consulting Physician (Ophthalmology)  Indicate any recent Medical Services you may have received from other than  [facility] providers in the past year (date may be approximate).     Assessment:   This is a routine wellness examination for Latissa. Physical assessment deferred to PCP. Hearing/Vision screen Hearing Screening Comments: Able to hear conversational tones w/o difficulty. No issues reported. Fails whisper test. Vision Screening Comments: Sees Dr. Posey Pronto for laser eye treatments. Dr. Gershon Crane for glasses. No new/worsening vision issues reported. Upcoming appt for diabetic eye exam next month.  Dietary issues and exercise activities discussed: Current Exercise Habits: The patient does not participate in regular exercise at present  Diet (meal preparation, eat out, water intake, caffeinated beverages, dairy products, fruits and vegetables): well balanced, on average, 3 meals per day, diabetic. Mostly prepares meals at home. Drinks lots of water. 1 cup of coffee daily. Does not drink soda.   24 Hour Recall Breakfast: sausage, egg, toast, applesauce Lunch: hot dog Dinner: sandwich  Goals    . Blood Pressure < 140/90      Depression Screen PHQ 2/9 Scores 10/06/2016 06/16/2015 04/08/2015 01/08/2015 07/03/2013 07/03/2013 12/12/2011  PHQ - 2 Score 1 0 0  0 0 0 2    Fall Risk Fall Risk  10/06/2016 09/08/2016 06/16/2015 04/08/2015 01/08/2015  Falls in the past year? No No No No No    Cognitive Function: MMSE - Mini Mental State Exam 10/06/2016  Orientation to time 5  Orientation to Place 5  Registration 3  Attention/ Calculation 5  Recall 3  Language- name 2 objects 2  Language- repeat 1  Language- follow 3 step command 3  Language- read & follow direction 1  Write a sentence 1  Copy design 1  Total score 30        Screening Tests Health Maintenance  Topic Date Due  . ZOSTAVAX  06/29/2001  . TETANUS/TDAP  03/02/2009  . COLONOSCOPY  08/02/2014  . FOOT EXAM  10/14/2014  . OPHTHALMOLOGY EXAM  01/14/2016  . HEMOGLOBIN A1C  09/06/2016  . LIPID PANEL  10/19/2016  . INFLUENZA VACCINE  Completed  . DEXA SCAN  Completed  . PNA vac Low Risk Adult  Completed      Plan:   Follow-up w/ PCP as scheduled. Follow-up w/ endo (Dr. Cruzita Lederer) for diabetes.  Called medical records for previous colonoscopy report-abstracted into HM. She is due for repeat colonoscopy-would like to wait until her husband recovers to proceed w/ referral. Flu shot given today. Pt to call insurance regarding coverage of Zostavax. Dental resource list provided. MMG as scheduled. Last US/biopsy in 2009 was negative.   During the course of the visit, Aithana was educated and counseled about the following appropriate screening and preventive services:   Vaccines to include Pneumoccal, Influenza, Hepatitis B, Td, Zostavax, HCV  Cardiovascular disease screening  Colorectal cancer screening  Bone density screening  Diabetes screening  Glaucoma screening  Mammography/PAP  Nutrition counseling  Patient Instructions (the written plan) were given to the patient.    Dorrene German, RN   10/06/2016     BP has been variable - suspect she has not been compliant with  BP meds recently.  Hoyle Sauer did stress compliance.  Has follow up next week, sooner if needed.    Medical screening examination/treatment/procedure(s) were performed by non-physician practitioner and as supervising physician I was immediately available for consultation/collaboration. I agree with above. Binnie Rail, MD

## 2016-10-10 ENCOUNTER — Encounter: Payer: Self-pay | Admitting: Family

## 2016-10-10 ENCOUNTER — Ambulatory Visit (INDEPENDENT_AMBULATORY_CARE_PROVIDER_SITE_OTHER): Payer: Medicare Other | Admitting: Family

## 2016-10-10 DIAGNOSIS — I10 Essential (primary) hypertension: Secondary | ICD-10-CM

## 2016-10-10 NOTE — Assessment & Plan Note (Signed)
Hypertension appears adequate control with current medication regimen and no adverse side effects. Denies worst headache of life with no new symptoms of end organ damage noted upon physical exam. Continue current dosage of amlodipine, carvedilol, clonidine, hydrochlorothiazide, and lisinopril. Encouraged follow sodium diet and monitor blood pressure at home.

## 2016-10-10 NOTE — Patient Instructions (Addendum)
Thank you for choosing [facility].  SUMMARY AND INSTRUCTIONS:  Medication:  Keep taking your medications as prescribed.   Your prescription(s) have been submitted to your pharmacy or been printed and provided for you. Please take as directed and contact our office if you believe you are having problem(s) with the medication(s) or have any questions.   Follow up:  If your symptoms worsen or fail to improve, please contact our office for further instruction, or in case of emergency go directly to the emergency room at the closest medical facility.   DASH Eating Plan DASH stands for "Dietary Approaches to Stop Hypertension." The DASH eating plan is a healthy eating plan that has been shown to reduce high blood pressure (hypertension). Additional health benefits may include reducing the risk of type 2 diabetes mellitus, heart disease, and stroke. The DASH eating plan may also help with weight loss. What do I need to know about the DASH eating plan? For the DASH eating plan, you will follow these general guidelines:  Choose foods with less than 150 milligrams of sodium per serving (as listed on the food label).  Use salt-free seasonings or herbs instead of table salt or sea salt.  Check with your health care provider or pharmacist before using salt substitutes.  Eat lower-sodium products. These are often labeled as "low-sodium" or "no salt added."  Eat fresh foods. Avoid eating a lot of canned foods.  Eat more vegetables, fruits, and low-fat dairy products.  Choose whole grains. Look for the word "whole" as the first word in the ingredient list.  Choose fish and skinless chicken or [place] more often than red meat. Limit fish, poultry, and meat to 6 oz (170 g) each day.  Limit sweets, desserts, sugars, and sugary drinks.  Choose heart-healthy fats.  Eat more home-cooked food and less restaurant, buffet, and fast food.  Limit fried foods.  Do not fry foods. Cook foods  using methods such as baking, boiling, grilling, and broiling instead.  When eating at a restaurant, ask that your food be prepared with less salt, or no salt if possible. What foods can I eat? Seek help from a dietitian for individual calorie needs. Grains  Whole grain or whole wheat bread. Brown rice. Whole grain or whole wheat pasta. Quinoa, bulgur, and whole grain cereals. Low-sodium cereals. Corn or whole wheat flour tortillas. Whole grain cornbread. Whole grain crackers. Low-sodium crackers. Vegetables  Fresh or frozen vegetables (raw, steamed, roasted, or grilled). Low-sodium or reduced-sodium tomato and vegetable juices. Low-sodium or reduced-sodium tomato sauce and paste. Low-sodium or reduced-sodium canned vegetables. Fruits  All fresh, canned (in natural juice), or frozen fruits. Meat and Other Protein Products  Ground beef (85% or leaner), grass-fed beef, or beef trimmed of fat. Skinless chicken or [place]. Ground chicken or [place]. Pork trimmed of fat. All fish and seafood. Eggs. Dried beans, peas, or lentils. Unsalted nuts and seeds. Unsalted canned beans. Dairy  Low-fat dairy products, such as skim or 1% milk, 2% or reduced-fat cheeses, low-fat ricotta or cottage cheese, or plain low-fat yogurt. Low-sodium or reduced-sodium cheeses. Fats and Oils  Tub margarines without trans fats. Light or reduced-fat mayonnaise and salad dressings (reduced sodium). Avocado. Safflower, olive, or canola oils. Natural peanut or almond butter. Other  Unsalted popcorn and pretzels. The items listed above may not be a complete list of recommended foods or beverages. Contact your dietitian for more options.  What foods are not recommended? Grains  White bread. White pasta. White rice. Refined  cornbread. Bagels and croissants. Crackers that contain trans fat. Vegetables  Creamed or fried vegetables. Vegetables in a cheese sauce. Regular canned vegetables. Regular canned tomato sauce and paste. Regular  tomato and vegetable juices. Fruits  Canned fruit in light or heavy syrup. Fruit juice. Meat and Other Protein Products  Fatty cuts of meat. Ribs, chicken wings, bacon, sausage, bologna, salami, chitterlings, fatback, hot dogs, bratwurst, and packaged luncheon meats. Salted nuts and seeds. Canned beans with salt. Dairy  Whole or 2% milk, cream, half-and-half, and cream cheese. Whole-fat or sweetened yogurt. Full-fat cheeses or blue cheese. Nondairy creamers and whipped toppings. Processed cheese, cheese spreads, or cheese curds. Condiments  Onion and garlic salt, seasoned salt, table salt, and sea salt. Canned and packaged gravies. Worcestershire sauce. Tartar sauce. Barbecue sauce. Teriyaki sauce. Soy sauce, including reduced sodium. Steak sauce. Fish sauce. Oyster sauce. Cocktail sauce. Horseradish. Ketchup and mustard. Meat flavorings and tenderizers. Bouillon cubes. Hot sauce. Tabasco sauce. Marinades. Taco seasonings. Relishes. Fats and Oils  Butter, stick margarine, lard, shortening, ghee, and bacon fat. Coconut, palm kernel, or palm oils. Regular salad dressings. Other  Pickles and olives. Salted popcorn and pretzels. The items listed above may not be a complete list of foods and beverages to avoid. Contact your dietitian for more information.  Where can I find more information? National Heart, Lung, and Blood Institute: travelstabloid.com This information is not intended to replace advice given to you by your health care provider. Make sure you discuss any questions you have with your health care provider. Document Released: 09/07/2011 Document Revised: 02/24/2016 Document Reviewed: 07/23/2013 Elsevier Interactive Patient Education  2017 [employer].

## 2016-10-10 NOTE — Progress Notes (Signed)
Subjective:    Patient ID: Wendy [name], female    DOB: [birth year], 76 y.o.   MRN: [id number]  Chief Complaint  Patient presents with  . Blood Pressure Check    HPI:  Wendy [name] is a 76 y.o. female who  has a past medical history of BREAST MASS, LEFT (04/13/2008); Colon polyps; DM (diabetes mellitus) ([place]); ECZEMA, ATOPIC DERMATITIS (11/29/2006); FRACTURE, TOE, RIGHT (10/08/2007); Heartburn (11/29/2006); HLD (hyperlipidemia); HTN (hypertension); and SYSTOLIC MURMUR (98/33/8250). and presents today for a follow up office visit.  Hypertension - Currently maintained on amlodipine, carvedilol, clonidine, hydrochlorothiazide, and lisinopril. Noted during her most recent wellness exam to have an elevated blood pressure 172/102 with concern for patient medication compliance. Denies any adverse side effects. Reports taking the medications as prescribed. Denies worst headache of life, but does have an occasional headache. No changes in vision or other symptoms of end organ damage. Not currently following a low sodium diet. Does have a significant amount of stress with her husband being in the hospital and rehabilitation and trying to take care of him.   BP Readings from Last 3 Encounters:  10/10/16 128/62  10/06/16 (!) 172/102  06/06/16 130/82     No Known Allergies    Outpatient Medications Prior to Visit  Medication Sig Dispense Refill  . amLODipine (NORVASC) 10 MG tablet TAKE 1 TABLET (10 MG TOTAL) BY MOUTH DAILY. 30 tablet 5  . aspirin 81 MG tablet Take 81 mg by mouth daily.      . B-D INS SYRINGE 0.5CC/31GX5/16 31G X 5/16" 0.5 ML MISC USE AS DIRECTED 4 TIMES A DAY 300 each 1  . Calcium Carbonate-Vit D-Min 600-400 MG-UNIT TABS Take 1 tablet by mouth daily.     . carvedilol (COREG) 25 MG tablet TAKE 1 TABLET BY MOUTH TWICE A DAY WITH A MEAL 90 tablet 3  . cloNIDine (CATAPRES) 0.3 MG tablet TAKE 1 TABLET BY MOUTH 2 TIMES DAILY. 180 tablet 1  . CVS SENNA PLUS 8.6-50 MG per tablet TAKE 1  TABLET BY MOUTH AT BEDTIME. 30 tablet 3  . hydrochlorothiazide (HYDRODIURIL) 25 MG tablet Take 1 tablet (25 mg total) by mouth daily. --- office visit needed for further refills. 30 tablet 0  . insulin aspart (NOVOLOG) 100 UNIT/ML injection Inject 6-10 Units into the skin 3 (three) times daily before meals. 2 vial 5  . insulin aspart (NOVOLOG) 100 UNIT/ML injection Inject under skin 6-10 units before the main 3 meals 30 mL 3  . insulin glargine (LANTUS) 100 UNIT/ML injection Inject 0.24 MLS (24 UNITS TOTAL) under skin in am 30 mL 3  . Insulin Syringe-Needle U-100 (B-D INS SYRINGE 0.5CC/30GX1/2") 30G X 1/2" 0.5 ML MISC USE AS DIRECTED 4 TIMES A DAY 300 each 3  . lisinopril (PRINIVIL,ZESTRIL) 40 MG tablet TAKE 1 TABLET (40 MG TOTAL) BY MOUTH DAILY. 30 tablet 5  . metFORMIN (GLUCOPHAGE) 500 MG tablet Take 2 tablets (1,000 mg total) by mouth daily with supper. 180 tablet 3  . rosuvastatin (CRESTOR) 5 MG tablet Take 1 tablet (5 mg total) by mouth daily. -- need labs for further refills 90 tablet 0   No facility-administered medications prior to visit.      Review of Systems  Constitutional: Negative for chills and fever.  Eyes:       Negative for changes in vision  Respiratory: Negative for cough, chest tightness and wheezing.   Cardiovascular: Negative for chest pain, palpitations and leg swelling.  Neurological: Negative for dizziness,  weakness and light-headedness.      Objective:    BP 128/62 (BP Location: Right Arm, Patient Position: Sitting, Cuff Size: Large)   Pulse 75   Temp 98.2 F (36.8 C) (Oral)   Resp 16   Ht 5\' 2"  (1.575 m)   Wt 179 lb 1.9 oz (81.2 kg)   SpO2 98%   BMI 32.76 kg/m  Nursing note and vital signs reviewed.  Physical Exam  Constitutional: She is oriented to person, place, and time. She appears well-developed and well-nourished. No distress.  Cardiovascular: Normal rate, regular rhythm, normal heart sounds and intact distal pulses.   Pulmonary/Chest: Effort  normal and breath sounds normal.  Neurological: She is alert and oriented to person, place, and time.  Skin: Skin is warm and dry.  Psychiatric: She has a normal mood and affect. Her behavior is normal. Judgment and thought content normal.       Assessment & Plan:   Problem List Items Addressed This Visit      Cardiovascular and Mediastinum   HYPERTENSION, BENIGN SYSTEMIC    Hypertension appears adequate control with current medication regimen and no adverse side effects. Denies worst headache of life with no new symptoms of end organ damage noted upon physical exam. Continue current dosage of amlodipine, carvedilol, clonidine, hydrochlorothiazide, and lisinopril. Encouraged follow sodium diet and monitor blood pressure at home.          I am having Ms. Kunst maintain her aspirin, Calcium Carbonate-Vit D-Min, CVS SENNA PLUS, insulin aspart, carvedilol, lisinopril, metFORMIN, cloNIDine, insulin aspart, insulin glargine, rosuvastatin, amLODipine, B-D INS SYRINGE 0.5CC/31GX5/16, Insulin Syringe-Needle U-100, and hydrochlorothiazide.   Follow-up: Return if symptoms worsen or fail to improve.  Mauricio Po, FNP

## 2016-10-21 ENCOUNTER — Other Ambulatory Visit: Payer: Self-pay | Admitting: Family Medicine

## 2016-10-23 DIAGNOSIS — Z853 Personal history of malignant neoplasm of breast: Secondary | ICD-10-CM | POA: Diagnosis not present

## 2016-10-23 DIAGNOSIS — H35033 Hypertensive retinopathy, bilateral: Secondary | ICD-10-CM | POA: Diagnosis not present

## 2016-10-23 DIAGNOSIS — H35373 Puckering of macula, bilateral: Secondary | ICD-10-CM | POA: Diagnosis not present

## 2016-10-23 DIAGNOSIS — E113512 Type 2 diabetes mellitus with proliferative diabetic retinopathy with macular edema, left eye: Secondary | ICD-10-CM | POA: Diagnosis not present

## 2016-10-23 DIAGNOSIS — Z961 Presence of intraocular lens: Secondary | ICD-10-CM | POA: Diagnosis not present

## 2016-10-25 ENCOUNTER — Other Ambulatory Visit: Payer: Self-pay | Admitting: Internal Medicine

## 2016-10-25 ENCOUNTER — Ambulatory Visit
Admission: RE | Admit: 2016-10-25 | Discharge: 2016-10-25 | Disposition: A | Discharge status: Home / Self Care | Payer: Medicare Other | Source: Ambulatory Visit | Attending: Internal Medicine | Admitting: Internal Medicine

## 2016-10-25 DIAGNOSIS — Z1231 Encounter for screening mammogram for malignant neoplasm of breast: Secondary | ICD-10-CM

## 2016-10-25 DIAGNOSIS — N63 Unspecified lump in unspecified breast: Secondary | ICD-10-CM

## 2016-10-25 DIAGNOSIS — N644 Mastodynia: Secondary | ICD-10-CM

## 2016-10-26 ENCOUNTER — Other Ambulatory Visit: Payer: Self-pay | Admitting: Internal Medicine

## 2016-10-26 DIAGNOSIS — N63 Unspecified lump in unspecified breast: Secondary | ICD-10-CM

## 2016-10-26 DIAGNOSIS — N644 Mastodynia: Secondary | ICD-10-CM

## 2016-10-30 ENCOUNTER — Other Ambulatory Visit: Payer: Self-pay | Admitting: Internal Medicine

## 2016-11-01 ENCOUNTER — Other Ambulatory Visit: Payer: Self-pay

## 2016-11-01 ENCOUNTER — Ambulatory Visit
Admission: RE | Admit: 2016-11-01 | Discharge: 2016-11-01 | Disposition: A | Discharge status: Home / Self Care | Payer: Medicare Other | Source: Ambulatory Visit | Attending: Internal Medicine | Admitting: Internal Medicine

## 2016-11-01 ENCOUNTER — Other Ambulatory Visit: Payer: Self-pay | Admitting: Internal Medicine

## 2016-11-01 DIAGNOSIS — N644 Mastodynia: Secondary | ICD-10-CM

## 2016-11-01 DIAGNOSIS — N6320 Unspecified lump in the left breast, unspecified quadrant: Secondary | ICD-10-CM

## 2016-11-01 DIAGNOSIS — N63 Unspecified lump in unspecified breast: Secondary | ICD-10-CM

## 2016-11-01 DIAGNOSIS — N6321 Unspecified lump in the left breast, upper outer quadrant: Secondary | ICD-10-CM | POA: Diagnosis not present

## 2016-11-01 DIAGNOSIS — R599 Enlarged lymph nodes, unspecified: Secondary | ICD-10-CM

## 2016-11-01 MED ORDER — BASAGLAR KWIKPEN 100 UNIT/ML ~~LOC~~ SOPN: PEN_INJECTOR | SUBCUTANEOUS | 3 refills | Status: DC

## 2016-11-01 NOTE — Telephone Encounter (Signed)
Left message advising of insulin change. Gave call back number if any questions.

## 2016-11-02 ENCOUNTER — Ambulatory Visit: Payer: Medicare Other | Admitting: Internal Medicine

## 2016-11-02 NOTE — Progress Notes (Signed)
Subjective:    Patient ID: Wendy [name], female    DOB: [birth year], 76 y.o.   MRN: [id number]  HPI She is here for an acute visit for left arm muscle pain.   Left arm pain: Her left upper arm has been sore for a 1-2 months. She denies any injuries or obvious cause pain.She notices the pain most when she goes to pick something up. The pain is only in her upper arm and she denies any decreased range of motion in her shoulder. Movement of her arm does not make the pain worse.  She denies numbness/tingling.  She does have some weakness in her left hand.  She has taken tylenol and it helps a little.   She denies any improvement or wosening of the pain since it started.   She is experiencing some increased stress. Her husband just was released from the hospital/rehabilitation and he has a lot of medical issues. She is also scheduled to have a breast biopsy week for a suspicious lesion.   Medications and allergies reviewed with patient and updated if appropriate.  Patient Active Problem List   Diagnosis Date Noted  . CKD (chronic kidney disease) stage 3, GFR 30-59 ml/min 03/17/2016  . Osteopenia 01/05/2016  . 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 Seasonal allergies 12/01/2013  . Chronic diastolic heart failure ([place]) 07/06/2011  . TOBACCO USE 04/13/2008  . Hyperlipidemia 11/29/2006  . OBESITY, BMI 30-35 11/29/2006  . HYPERTENSION, BENIGN SYSTEMIC 11/29/2006    Current Outpatient Prescriptions on File Prior to Visit  Medication Sig Dispense Refill  . amLODipine (NORVASC) 10 MG tablet TAKE 1 TABLET (10 MG TOTAL) BY MOUTH DAILY. 30 tablet 5  . aspirin 81 MG tablet Take 81 mg by mouth daily.      . B-D INS SYRINGE 0.5CC/31GX5/16 31G X 5/16" 0.5 ML MISC USE AS DIRECTED 4 TIMES A DAY 300 each 1  . Calcium Carbonate-Vit D-Min 600-400 MG-UNIT TABS Take 1 tablet by mouth daily.       . carvedilol (COREG) 25 MG tablet TAKE 1 TABLET BY MOUTH TWICE A DAY WITH A MEAL 90 tablet 3  . cloNIDine (CATAPRES) 0.3 MG tablet TAKE 1 TABLET BY MOUTH 2 TIMES DAILY. 180 tablet 1  . [facility] SENNA PLUS 8.6-50 MG per tablet TAKE 1 TABLET BY MOUTH AT BEDTIME. 30 tablet 3  . hydrochlorothiazide (HYDRODIURIL) 25 MG tablet Take 1 tablet (25 mg total) by mouth daily. --- office visit needed for further refills. 30 tablet 0  . insulin aspart (NOVOLOG) 100 UNIT/ML injection Inject 6-10 Units into the skin 3 (three) times daily before meals. 2 vial 5  . insulin aspart (NOVOLOG) 100 UNIT/ML injection Inject under skin 6-10 units before the main 3 meals 30 mL 3  . Insulin Glargine (BASAGLAR KWIKPEN) 100 UNIT/ML SOPN Inject 24 units into the skin in the am. 5 pen 3  . Insulin Syringe-Needle U-100 (B-D INS SYRINGE 0.5CC/30GX1/2") 30G X 1/2" 0.5 ML MISC USE AS DIRECTED 4 TIMES A DAY 300 each 3  . lisinopril (PRINIVIL,ZESTRIL) 40 MG tablet TAKE 1 TABLET (40 MG TOTAL) BY MOUTH DAILY. 30 tablet 0  . metFORMIN (GLUCOPHAGE) 500 MG tablet Take 2 tablets (1,000 mg total) by mouth daily with supper. 180 tablet 3  . rosuvastatin (CRESTOR) 5 MG tablet Take 1 tablet (5 mg total) by mouth daily. -- need labs for further refills 90  tablet 0   No current facility-administered medications on file prior to visit.     Past Medical History:  Diagnosis Date  . BREAST MASS, LEFT 04/13/2008   Per note 04/13/2008: U/S guided core needle biopsy of left breast on 02/21/08. Benign per pathologist read but was referred to Dr. Brantley Stage for surgical excision and patient reports this was excised.    . Colon polyps   . DM (diabetes mellitus) ([place])   . 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 FRACTURE, TOE, RIGHT 10/08/2007  . Heartburn 11/29/2006   Qualifier: Diagnosis of  By: Herma Ard    . HLD (hyperlipidemia)   . HTN (hypertension)   . SYSTOLIC MURMUR 29/93/7169    Past  Surgical History:  Procedure Laterality Date  . BREAST LUMPECTOMY Left     Social History   Social History  . Marital status: Married    Spouse name: N/A  . Number of children: 4  . Years of education: N/A   Occupational History  . [employer]   Social History Main Topics  . Smoking status: Never Smoker  . Smokeless tobacco: Former Systems developer    Types: Snuff    Quit date: 10/02/2014     Comment: Quit 10/02/2014  previous 1 box per week of snuff   . Alcohol use No  . Drug use: No  . Sexual activity: Not Currently   Other Topics Concern  . None   Social History Narrative  . None    Family History  Problem Relation Age of Onset  . Diabetes Sister     x 4  . Diabetes Brother   . Diabetes Paternal Grandmother   . Diabetes Paternal Aunt   . Heart disease Brother   . Heart disease Sister   . Emphysema Brother     Review of Systems  Constitutional: Negative for fever.  Musculoskeletal: Positive for myalgias. Negative for arthralgias, joint swelling, neck pain and neck stiffness.       No decreased range of motion left shoulder  Neurological: Positive for weakness. Negative for numbness.       Objective:   Vitals:   11/03/16 1102  BP: (!) 162/72  Pulse: (!) 110  Resp: 16  Temp: 98 F (36.7 C)   Filed Weights   11/03/16 1102  Weight: 177 lb (80.3 kg)   Body mass index is 32.37 kg/m.  Wt Readings from Last 3 Encounters:  11/03/16 177 lb (80.3 kg)  10/10/16 179 lb 1.9 oz (81.2 kg)  10/06/16 181 lb 4 oz (82.2 kg)     Physical Exam  Constitutional: She appears well-developed and well-nourished. No distress.  Musculoskeletal: She exhibits no edema.  Left shoulder with normal range of motion. No pain with palpation of shoulder. No pain with palpation of left upper arm. No pain with supination or resistance against lifting arm  Neurological:  Normal sensation bilateral upper extremities. Normal strength bilateral hands  Skin: She is not  diaphoretic.          Assessment & Plan:   See Problem List for Assessment and Plan of chronic medical problems.

## 2016-11-03 ENCOUNTER — Encounter: Payer: Self-pay | Admitting: Internal Medicine

## 2016-11-03 ENCOUNTER — Ambulatory Visit (INDEPENDENT_AMBULATORY_CARE_PROVIDER_SITE_OTHER): Payer: Medicare Other | Admitting: Internal Medicine

## 2016-11-03 VITALS — BP 162/72 | HR 110 | Temp 98.0°F | Resp 16 | Wt 177.0 lb

## 2016-11-03 DIAGNOSIS — M79622 Pain in left upper arm: Secondary | ICD-10-CM | POA: Diagnosis not present

## 2016-11-03 NOTE — Patient Instructions (Addendum)
Continue to take the tylenol as needed.   Use ice and heat.  Try to avoid lifting with your arm.  Do exercises at home  Sees Dr Wendy [name] for further evaluation and treatment.     Biceps Tendon Tendinitis (Proximal) and Tenosynovitis The proximal biceps tendon is a strong cord of tissue that connects the biceps muscle, on the front of the upper arm, to the shoulder blade. Tendinitis is inflammation of a tendon. Tenosynovitis is inflammation of the lining around the tendon (tendon sheath). These conditions often occur at the same time, and they can interfere with the ability to bend the elbow and turn the hand palm-up (supination). Proximal biceps tendon tendinitis and tenosynovitis are usually caused by overusing the shoulder joint and the biceps muscle. These conditions usually heal within 6 weeks. Proximal biceps tendon tendinitis may include a grade 1 or grade 2 strain of the tendon. A grade 1 strain is mild, and it involves a slight pull of the tendon without any stretching or noticeable tearing of the tendon. There is usually no loss of biceps muscle strength. A grade 2 strain is moderate, and it involves a small tear in the tendon. The tendon is stretched, and biceps strength is usually decreased. What are the causes? This condition may be caused by:  A sudden increase in frequency or intensity of activity that involves the shoulder and the biceps muscle.  Overuse of the biceps muscle. This can happen when you do the same movements over and over, such as:  Supination.  Forceful straightening (hyperextension) of the elbow.  Bending the elbow.  A direct, forceful hit or injury (trauma) to the elbow. This is rare. What increases the risk? The following factors may make you more likely to develop this condition:  Playing contact sports.  Playing sports that involve throwing and overhead movements, including racket sports, gymnastics, weight lifting, or bodybuilding.  Doing physical  labor.  Having poor strength and flexibility of the arm and shoulder. What are the signs or symptoms? Symptoms of this condition may include:  Pain and inflammation in the front of the shoulder. Pain may get worse with movement, especially when you use resistance, as in weight lifting.  A feeling of warmth in the front of the shoulder.  Limited range of motion of the shoulder and the elbow.  A crackling sound (crepitation) when you move or touch the shoulder or the upper arm. In some cases, symptoms may return (recur) after treatment, and they may be long-lasting (chronic). How is this diagnosed? This condition is diagnosed based on your symptoms, your medical history, and a physical exam. You may have tests, including X-rays or MRIs. Your health care provider may test your range of motion by asking you to do arm movements. How is this treated? This condition is treated by resting and icing the injured area, and by doing physical therapy exercises. Depending on the severity of your condition, treatment may also include:  Medicines to help relieve pain and inflammation.  Ultrasound therapy. This is the application of sound waves to the injured area.  Injecting medicines (corticosteroids) into your tendon sheath.  Injecting medicines that numb the area (local anesthetics).  Surgery to remove the damaged part of the tendon and reattach the undamaged part of the tendon to the arm bone (humerus). This is usually only done if you have symptoms that do not get better with other treatment methods. Follow these instructions at home: Managing pain, stiffness, and swelling  If directed, put  ice on the injured area:  Put ice in a plastic bag.  Place a towel between your skin and the bag.  Leave the ice on for 20 minutes, 2-3 times a day.  Move your fingers often to avoid stiffness and to lessen swelling.  Raise (elevate) the injured area above the level of your heart while you are  sitting or lying down.  If directed, apply heat to the affected area before you exercise. Use the heat source that your health care provider recommends, such as a moist heat pack or a heating pad.  Place a towel between your skin and the heat source.  Leave the heat on for 20-30 minutes.  Remove the heat if your skin turns bright red. This is especially important if you are unable to feel pain, heat, or cold. You may have a greater risk of getting burned. Activity  Return to your normal activities as told by your health care provider. Ask your health care provider what activities are safe for you.  Do not lift anything that is heavier than 10 lb (4.5 kg) until your health care provider tells you that it is safe.  Avoid activities that cause pain or make your condition worse.  Do exercises as told by your health care provider. General instructions  Take over-the-counter and prescription medicines only as told by your health care provider.  Do not drive or operate heavy machinery while taking prescription pain medicines.  Keep all follow-up visits as told by your health care provider. This is important. How is this prevented?  Warm up and stretch before being active.  Cool down and stretch after being active.  Give your body time to rest between periods of activity.  Make sure any equipment that you use is fitted to you.  Be safe and responsible while being active to avoid falls.  Do at least 150 minutes of moderate-intensity aerobic exercise each week, such as brisk walking or water aerobics.  Maintain physical fitness, including:  Strength.  Flexibility.  Cardiovascular fitness.  Endurance. Contact a health care provider if:  You have symptoms that get worse or do not get better after 2 weeks of treatment.  You develop new symptoms. Get help right away if:  You develop severe pain. This information is not intended to replace advice given to you by your health  care provider. Make sure you discuss any questions you have with your health care provider. Document Released: 09/18/2005 Document Revised: 05/25/2016 Document Reviewed: 08/27/2015 Elsevier Interactive Patient Education  2017 [employer].

## 2016-11-03 NOTE — Progress Notes (Signed)
Pre visit review using our clinic review tool, if applicable. No additional management support is needed unless otherwise documented below in the visit note. 

## 2016-11-03 NOTE — Assessment & Plan Note (Signed)
Possible tendinitis or strain Exam is fairly unremarkable, but persistent pain for a month or so with lifting No neurological defect Start exercises at home-deferred PT We will refer to Dr. Tamala Julian if no improvement for further evaluation and treatment

## 2016-11-06 ENCOUNTER — Other Ambulatory Visit: Payer: Self-pay | Admitting: Family Medicine

## 2016-11-07 ENCOUNTER — Ambulatory Visit
Admission: RE | Admit: 2016-11-07 | Discharge: 2016-11-07 | Disposition: A | Discharge status: Home / Self Care | Payer: Medicare Other | Source: Ambulatory Visit | Attending: Internal Medicine | Admitting: Internal Medicine

## 2016-11-07 ENCOUNTER — Other Ambulatory Visit: Payer: Self-pay | Admitting: Internal Medicine

## 2016-11-07 DIAGNOSIS — R599 Enlarged lymph nodes, unspecified: Secondary | ICD-10-CM

## 2016-11-07 DIAGNOSIS — R928 Other abnormal and inconclusive findings on diagnostic imaging of breast: Secondary | ICD-10-CM

## 2016-11-07 DIAGNOSIS — N6320 Unspecified lump in the left breast, unspecified quadrant: Secondary | ICD-10-CM

## 2016-11-07 DIAGNOSIS — C50412 Malignant neoplasm of upper-outer quadrant of left female breast: Secondary | ICD-10-CM | POA: Diagnosis not present

## 2016-11-07 DIAGNOSIS — Z170 Estrogen receptor positive status [ER+]: Secondary | ICD-10-CM | POA: Diagnosis not present

## 2016-11-07 DIAGNOSIS — R590 Localized enlarged lymph nodes: Secondary | ICD-10-CM | POA: Diagnosis not present

## 2016-11-07 DIAGNOSIS — N6321 Unspecified lump in the left breast, upper outer quadrant: Secondary | ICD-10-CM | POA: Diagnosis not present

## 2016-11-10 ENCOUNTER — Other Ambulatory Visit: Payer: Self-pay | Admitting: Internal Medicine

## 2016-11-10 DIAGNOSIS — I10 Essential (primary) hypertension: Secondary | ICD-10-CM

## 2016-11-13 ENCOUNTER — Ambulatory Visit: Payer: Self-pay | Admitting: Surgery

## 2016-11-13 DIAGNOSIS — C50912 Malignant neoplasm of unspecified site of left female breast: Secondary | ICD-10-CM | POA: Diagnosis not present

## 2016-11-13 DIAGNOSIS — Z170 Estrogen receptor positive status [ER+]: Principal | ICD-10-CM

## 2016-11-13 DIAGNOSIS — C50412 Malignant neoplasm of upper-outer quadrant of left female breast: Secondary | ICD-10-CM

## 2016-11-13 NOTE — H&P (Signed)
Wendy Wendy [name] 11/13/2016 2:13 PM Location: [facility] Patient #: [id number] DOB: 22-Mar-1941 Married / Language: English / Race: Black or African American Wendy [name]  History of Present Illness Wendy Wendy [name] A. Wendy Wendy [name] Cork MD; 11/13/2016 2:38 PM) Patient words: Patient sent at Wendy request of Dr. Celso [name] for left breast mass. Wendy patient noted a left breast mass upper outer quadrant about 2 months ago. Wendy Wendy [name]. Wendy Wendy [name] which showed a 3.1 cm mass left breast upper outer quadrant with pleomorphic calcifications. Wendy Wendy [name]. Wendy right breast was read as normal. 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 HER-2/neu pending. Wendy Wendy [name]. Wendy is sore from her biopsy Wendy states.         CLINICAL DATA: 76 year old Wendy [name] complaining of a palpable mass in Wendy left breast.  EXAM: 2D DIGITAL DIAGNOSTIC LEFT MAMMOGRAM WITH CAD AND ADJUNCT TOMO  Wendy [name] LEFT BREAST  COMPARISON: Previous exam(s).  ACR Breast Density Category b: There are scattered areas of fibroglandular density.  FINDINGS: In Wendy upper-outer quadrant of Wendy anterior third of Wendy left breast is a developing 3.1 x 2.1 x 1.4 cm mass associated with pleomorphic calcifications. There is a 8 mm prominent lymph node seen in Wendy left Wendy [name].  Mammographic images were processed with CAD.  On physical exam, I palpate a discrete mass in Wendy left breast at 1 o'clock 2 cm from Wendy nipple.  Targeted Wendy [name] is performed, showing an irregular hypoechoic mass in Wendy left breast at 1 o'clock 2 cm from Wendy nipple measuring 3.3 x 2.0 x 2.7 cm. Sonographic evaluation Wendy left Wendy [name] shows a 1.8 cm lymph node with a focally thickened cortex measuring measuring 5 mm.  IMPRESSION: Wendy mass in Wendy 1 o'clock region of Wendy left breast and abnormal axillary lymph  node are suspicious for invasive mammary carcinoma with axillary metastasis.  RECOMMENDATION: Wendy [name]-guided core biopsies of Wendy left breast mass as well as Wendy abnormal left axillary lymph node is recommended. Wendy biopsies will be scheduled at Wendy patient's convenience.  I have discussed Wendy findings and recommendations with Wendy patient. Results were also provided in writing at Wendy conclusion of Wendy visit. If applicable, a reminder letter will be sent to Wendy patient regarding Wendy next appointment.  BI-RADS CATEGORY 5: Highly suggestive of malignancy.   Electronically Signed By: Wendy Wendy [name] M.D. On: 11/01/2016 13:35            ADDITIONAL INFORMATION: 1. PROGNOSTIC INDICATORS Results: IMMUNOHISTOCHEMICAL AND MORPHOMETRIC ANALYSIS PERFORMED MANUALLY Estrogen Receptor: 80%, POSITIVE, WEAK STAINING INTENSITY Progesterone Receptor: 0%, NEGATIVE Proliferation Marker Ki67: 90% COMMENT: Wendy negative hormone receptor study(ies) in this case has An internal positive control. REFERENCE RANGE ESTROGEN RECEPTOR NEGATIVE 0% POSITIVE =>1% REFERENCE RANGE PROGESTERONE RECEPTOR NEGATIVE 0% POSITIVE =>1% All controls stained appropriately Wendy Cutter MD Pathologist, Electronic Signature ( Signed 11/09/2016) FINAL DIAGNOSIS 1 of 3 FINAL for Wendy Wendy [name] [phone]) Diagnosis 1. Breast, left, needle core biopsy, 1:00 o'clock upper outer quadrant - INVASIVE DUCTAL CARCINOMA. - DUCTAL CARCINOMA IN SITU - SEE COMMENT. 2. Lymph node, needle/core biopsy, inferior left Wendy [name] - ONE PARTIALLY SAMPLED BENIGN LYMPH NODE WITH NO TUMOR SEEN. Microscopic Comment 1. Although definitive grading of breast carcinoma is best.  Wendy patient is a 76 year old Wendy [name].   Past Surgical History Wendy Wendy [name], Wendy [name]; 11/13/2016 2:13 PM) Breast Biopsy Left.  Diagnostic Studies History Wendy Wendy [name],  RMA; 11/13/2016 2:13 PM) Colonoscopy 1-5 years ago Pap Smear 1-5 years  ago  Allergies Wendy Wendy [name], RMA; 11/13/2016 2:14 PM) No Known Allergies 11/13/2016  Medication History Wendy Wendy [name], RMA; 11/13/2016 2:14 PM) AmLODIPine Besylate ('10MG'$  Tablet, Oral) Active. Basaglar KwikPen (100UNIT/ML Soln Pen-inj, Subcutaneous) Active. BD Insulin Syringe (30G X 1/2"0.5 ML Misc,) Active. BD Insulin Syr Ultrafine II (31G X 5/16"0.5 ML Misc,) Active. Carvedilol ('25MG'$  Tablet, Oral) Active. CloNIDine HCl (0.'3MG'$  Tablet, Oral) Active. HydroCHLOROthiazide ('25MG'$  Tablet, Oral) Active. Lantus (100UNIT/ML Solution, Subcutaneous) Active. Lisinopril ('40MG'$  Tablet, Oral) Active. MetFORMIN HCl ('500MG'$  Tablet, Oral) Active. NovoLOG (100UNIT/ML Solution, Subcutaneous) Active. Rosuvastatin Calcium ('5MG'$  Tablet, Oral) Active. Medications Reconciled  Social History Wendy Wendy [name], Wendy [name]; 11/13/2016 2:13 PM) Caffeine use Carbonated beverages, Coffee. No alcohol use No drug use Tobacco use Never smoker.  Family History Wendy Wendy [name], Wendy [name]; 11/13/2016 2:13 PM) Arthritis Sister. Diabetes Mellitus Brother, Sister. Heart Disease Brother. Hypertension Brother, Sister.  Pregnancy / Birth History Wendy Wendy [name], Wendy [name]; 11/13/2016 2:13 PM) Age at menarche 54 years. Age of menopause 69-50 Gravida 31 Maternal age 5-25 Para 56  Other Problems Wendy Wendy [name], Wendy [name]; 11/13/2016 2:13 PM) Breast Cancer Diabetes Mellitus Gastroesophageal Reflux Disease High blood pressure Hypercholesterolemia     Review of Systems Wendy Wendy [name] RMA; 11/13/2016 2:13 PM) General Present- Weight Loss. Not Present- Appetite Loss, Chills, Fatigue, Fever, Night Sweats and Weight Gain. Skin Not Present- Change in Wart/Mole, Dryness, Hives, Jaundice, New Lesions, Non-Healing Wounds, Rash and Ulcer. HEENT Not Present- Earache, Hearing Loss, Hoarseness, Nose Bleed, Oral Ulcers, Ringing in Wendy Ears, Seasonal Allergies, Sinus Pain, Sore Throat, Visual Disturbances, Wears  glasses/contact lenses and Yellow Eyes. Breast Present- Breast Mass and Breast Pain. Not Present- Nipple Wendy [name] and Skin Changes. Cardiovascular Present- Leg Cramps. Not Present- Chest Pain, Difficulty Breathing Lying Down, Palpitations, Rapid Heart Rate, Shortness of Breath and Swelling of Extremities. Gastrointestinal Present- Constipation. Not Present- Abdominal Pain, Bloating, Bloody Stool, Change in Bowel Habits, Chronic diarrhea, Difficulty Swallowing, Excessive gas, Gets full quickly at meals, Hemorrhoids, Indigestion, Nausea, Rectal Pain and Vomiting. Wendy [name] Genitourinary Present- Urgency. Not Present- Frequency, Nocturia, Painful Urination and Pelvic Pain. Musculoskeletal Present- Muscle Pain and Muscle Weakness. Not Present- Back Pain, Joint Pain, Joint Stiffness and Swelling of Extremities. Neurological Not Present- Decreased Memory, Fainting, Headaches, Numbness, Seizures, Tingling, Tremor, Trouble walking and Weakness. Psychiatric Not Present- Anxiety, Bipolar, Change in Sleep Pattern, Depression, Fearful and Frequent crying. Endocrine Not Present- Cold Intolerance, Excessive Hunger, Hair Changes, Heat Intolerance, Hot flashes and New Diabetes. Hematology Not Present- Blood Thinners, Easy Bruising, Excessive bleeding, Gland problems, HIV and Persistent Infections.  Vitals Wendy Wendy [name] RMA; 11/13/2016 2:15 PM) 11/13/2016 2:14 PM Weight: 182 lb Height: 62in Body Surface Area: 1.84 m Body Mass Index: 33.29 kg/m  Temp.: 98.38F  Pulse: 72 (Regular)  BP: 150/70 (Sitting, Left Arm, Standard)      Physical Exam (Tamakia Porto A. Tonishia Steffy MD; 11/13/2016 2:39 PM)  General Mental Status-Alert. General Appearance-Consistent with stated age. Hydration-Well hydrated. Voice-Normal.  Head and Neck Head-normocephalic, atraumatic with no lesions or palpable masses. Trachea-midline. Thyroid Gland Characteristics - normal size and consistency.  Chest and Lung  Exam Chest and lung exam reveals -quiet, even and easy respiratory effort with no use of accessory muscles and on auscultation, normal breath sounds, no adventitious sounds and normal vocal resonance. Inspection Chest Wall - Normal. Back - normal.  Breast Note: Left breast upper outer quadrant is a mobile 3 cm mass. No nipple Wendy [name]. Mild left axillary adenopathy noted  Cardiovascular Cardiovascular examination reveals -normal  heart sounds, regular rate and rhythm with no murmurs and normal pedal pulses bilaterally.  Neurologic Neurologic evaluation reveals -alert and oriented x 3 with no impairment of recent or remote memory. Mental Status-Normal.  Lymphatic Head & Neck  General Head & Neck Lymphatics: Bilateral - Description - Normal. Axillary -Note:Mild left axillary adenopathy noted. Right Wendy [name] is normal.     Assessment & Plan (Eldred Lievanos A. Lorean Ekstrand MD; 11/13/2016 2:40 PM)  BREAST CANCER, LEFT (C50.912) Impression: Stage II  Discussed breast conservation versus mastectomy with reconstruction. It's large enough tumor that Wendy could benefit from neoadjuvant chemotherapy but certainly given her breast size location of tumor lumpectomy could Be done up front. Her further medical radiation oncology. Wendy would like to proceed with left breast seed Risk of lumpectomy include bleeding, infection, seroma, more surgery, use of seed/wire, wound care, cosmetic deformity and Wendy need for other treatments, death , blood clots, death. Pt agrees to proceed. Risk of sentinel lymph node mapping include bleeding, infection, lymphedema, shoulder pain. stiffness, dye allergy. cosmetic deformity , blood clots, death, need for more surgery. Pt agres to proceed. localized partial mastectomy with left axillary sentinel lymph node mapping.     Risk of lumpectomy include bleeding, infection, seroma, more surgery, use of seed/wire, wound care, cosmetic deformity and Wendy need for other  treatments, death , blood clots, death. Pt agrees to proceed. Risk of sentinel lymph node mapping include bleeding, infection, lymphedema, shoulder pain. stiffness, dye allergy. cosmetic deformity , blood clots, death, need for more surgery. Pt agres to proceed.  Current Plans You are being scheduled for surgery- Our schedulers will call you.  You should hear from our office's scheduling department within 5 working days about Wendy location, date, and time of surgery. We try to make accommodations for patient's preferences in scheduling surgery, but sometimes Wendy OR schedule or Wendy surgeon's schedule prevents [name] from making those accommodations.  If you have not heard from our office [phone]) in 5 working days, call Wendy office and ask for your surgeon's nurse.  If you have other questions about your diagnosis, plan, or surgery, call Wendy office and ask for your surgeon's nurse.  We discussed Wendy staging and pathophysiology of breast cancer. We discussed all of Wendy different options for treatment for breast cancer including surgery, chemotherapy, radiation therapy, Herceptin, and antiestrogen therapy. We discussed a sentinel lymph node biopsy as Wendy does not appear to having lymph node involvement right now. We discussed Wendy performance of that with injection of radioactive tracer and blue dye. We discussed that Wendy would have an incision underneath her axillary hairline. We discussed that there is a bout a 10-20% chance of having a positive node with a sentinel lymph node biopsy and we will await Wendy permanent pathology to make any other first further decisions in terms of her treatment. One of these options might be to return to Wendy operating room to perform an axillary lymph node dissection. We discussed about a 1-2% risk lifetime of chronic shoulder pain as well as lymphedema associated with a sentinel lymph node biopsy. We discussed Wendy options for treatment of Wendy breast cancer which  included lumpectomy versus a mastectomy. We discussed Wendy performance of Wendy lumpectomy with a wire placement. We discussed a 10-20% chance of a positive margin requiring reexcision in Wendy operating room. We also discussed that Wendy may need radiation therapy or antiestrogen therapy or both if Wendy undergoes lumpectomy. We discussed Wendy mastectomy and Wendy postoperative care for that as well.  We discussed that there is no difference in her survival whether Wendy undergoes lumpectomy with radiation therapy or antiestrogen therapy versus a mastectomy. There is a slight difference in Wendy local recurrence rate being 3-5% with lumpectomy and about 1% with a mastectomy. We discussed Wendy risks of operation including bleeding, infection, possible reoperation. Wendy understands her further therapy will be based on what her stages at Wendy time of her operation.  Pt Education - [facility] Breast Cancer Information Given - [facility] "Breast Journey" Package Pt Education - ABC (After Breast Cancer) Class Info: discussed with patient and provided information.

## 2016-11-15 ENCOUNTER — Ambulatory Visit: Payer: Medicare Other | Admitting: Family Medicine

## 2016-11-15 ENCOUNTER — Other Ambulatory Visit: Payer: Self-pay | Admitting: Internal Medicine

## 2016-11-15 NOTE — Progress Notes (Deleted)
[facility] [place], [place] [address] Phone: [phone] Subjective:    I'm seeing this patient by the request  of:  Binnie Rail, MD   CC: Left arm pain  [id number]  Wendy [name] is a 76 y.o. female coming in with complaint of left arm pain. Past medical history significant for left-sided breast mass that is being biopsy. Biopsy came back with invasive ductal carcinoma. Supposed to be undergoing excision and the near future. Patient states that her arm has been sore for the last couple months at least. Her to pick something up she can have a severe bout of pain. States that she still feels like she has full range of motion but any type of movement now seems to make the pain worse. Denies any weakness of the arm or any numbness in the hand. States that Tylenol does help a little bit. Patient states that this seems to be chronic at this point.     Past Medical History:  Diagnosis Date  . BREAST MASS, LEFT 04/13/2008   Per note 04/13/2008: U/S guided core needle biopsy of left breast on 02/21/08. Benign per pathologist read but was referred to Dr. Brantley Stage for surgical excision and patient reports this was excised.    . Colon polyps   . DM (diabetes mellitus) ([place])   . 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 FRACTURE, TOE, RIGHT 10/08/2007  . Heartburn 11/29/2006   Qualifier: Diagnosis of  By: Herma Ard    . HLD (hyperlipidemia)   . HTN (hypertension)   . SYSTOLIC MURMUR 89/38/1017   Past Surgical History:  Procedure Laterality Date  . BREAST LUMPECTOMY Left    Social History   Social History  . Marital status: Married    Spouse name: N/A  . Number of children: 4  . Years of education: N/A   Occupational History  . [employer]   Social History Main Topics  . Smoking status: Never Smoker  . Smokeless tobacco: Former Systems developer    Types: Snuff    Quit date:  10/02/2014     Comment: Quit 10/02/2014  previous 1 box per week of snuff   . Alcohol use No  . Drug use: No  . Sexual activity: Not Currently   Other Topics Concern  . Not on file   Social History Narrative  . No narrative on file   No Known Allergies Family History  Problem Relation Age of Onset  . Diabetes Sister     x 4  . Diabetes Brother   . Diabetes Paternal Grandmother   . Diabetes Paternal Aunt   . Heart disease Brother   . Heart disease Sister   . Emphysema Brother     Past medical history, social, surgical and family history all reviewed in electronic medical record.  No pertanent information unless stated regarding to the chief complaint.   Review of Systems:Review of systems updated and as accurate as of 11/15/16  No headache, visual changes, nausea, vomiting, diarrhea, constipation, dizziness, abdominal pain, skin rash, fevers, chills, night sweats, weight loss, swollen lymph nodes, body aches, joint swelling, muscle aches, chest pain, shortness of breath, mood changes.   Objective  There were no vitals taken for this visit. Systems examined below as of 11/15/16   General: No apparent distress alert and oriented x3 mood and affect normal, dressed appropriately.  HEENT: Pupils  equal, extraocular movements intact  Respiratory: Patient's speak in full sentences and does not appear short of breath  Cardiovascular: No lower extremity edema, non tender, no erythema  Skin: Warm dry intact with no signs of infection or rash on extremities or on axial skeleton.  Abdomen: Soft nontender  Neuro: Cranial nerves II through XII are intact, neurovascularly intact in all extremities with 2+ DTRs and 2+ pulses.  Lymph: No lymphadenopathy of posterior or anterior cervical chain or axillae bilaterally.  Gait normal with good balance and coordination.  MSK:  Non tender with full range of motion and good stability and symmetric strength and tone of shoulders, elbows, wrist, hip, knee  and ankles bilaterally.     Impression and Recommendations:     This case required medical decision making of moderate complexity.      Note: This dictation was prepared with Dragon dictation along with smaller phrase technology. Any transcriptional errors that result from this process are unintentional.

## 2016-11-16 ENCOUNTER — Encounter: Payer: Self-pay | Admitting: Radiation Oncology

## 2016-11-16 NOTE — Progress Notes (Addendum)
.   Location of Breast Cancer: Left Breast Upper Outer Quadrant  Histology per Pathology Report: Diagnosis 11/07/2016: 1. Breast, left, needle core biopsy, 1:00 o'clock upper outer quadrant - INVASIVE DUCTAL CARCINOMA.- DUCTAL CARCINOMA IN SITU- SEE COMMENT. 2. Lymph node, needle/core biopsy, inferior left axilla - ONE PARTIALLY SAMPLED BENIGN LYMPH NODE WITH NO TUMOR SEEN.  Receptor Status: ER(80%+), PR (neg`0%), Her2-neu (neg ratio=1.71), Ki-67(90%)  Did patient present with symptoms (if so, please note symptoms) or was this found on screening mammography?: patient noticed  Mass    Past/Anticipated interventions by surgeon, if any: Dr. Erroll Luna ,MD surgery not scheduled as yet    History Left Breast biopsy  05/05/2008=  5 o'clock position  = Biphasic Proliferation,  Path report in chart  Past/Anticipated interventions by medical oncology, if any: Chemotherapy  Dr. Burr Medico, MD appt 11/23/2016 @ 2:30 PM  Lymphedema issues, if any:  NO  Pain issues, if any: soreness at bx site  SAFETY ISSUES: No  Prior radiation?  No  Pacemaker/ICD? NO  Possible current pregnancy? N/A Is the patient on methotrexate? NO Current Complaints / other details:  Menarche age 24, G5P4, no tobacco use ever, no alcohol or drug use    Rebecca Eaton, RN 11/16/2016,3:55 PM BP (!) 193/90 (BP Location: Left Arm, Patient Position: Sitting, Cuff Size: Normal)   Pulse 90   Temp 98.6 F (37 C) (Oral)   Resp 20   Ht _0  (1.575 m)   Wt 179 lb 3.2 oz (81.3 kg)   BMI 32.78 kg/m   Wt Readings from Last 3 Encounters:  11/20/16 179 lb 3.2 oz (81.3 kg)  11/03/16 177 lb (80.3 kg)  10/10/16 179 lb 1.9 oz (81.2 kg)

## 2016-11-17 ENCOUNTER — Telehealth: Payer: Self-pay | Admitting: Hematology

## 2016-11-17 ENCOUNTER — Encounter: Payer: Self-pay | Admitting: Hematology

## 2016-11-17 NOTE — Telephone Encounter (Signed)
Appt has been scheduled for the pt to see Dr. Burr Medico on 2/22 at 2:30pm. Pt has been made aware to arrive 30 minutes early. Demographics verified. Letter mailed.

## 2016-11-20 ENCOUNTER — Ambulatory Visit
Admission: RE | Admit: 2016-11-20 | Discharge: 2016-11-20 | Disposition: A | Discharge status: Home / Self Care | Payer: Medicare Other | Source: Ambulatory Visit | Attending: Radiation Oncology | Admitting: Radiation Oncology

## 2016-11-20 ENCOUNTER — Encounter: Payer: Self-pay | Admitting: Radiation Oncology

## 2016-11-20 VITALS — BP 193/90 | HR 90 | Temp 98.6°F | Resp 20 | Ht 62.0 in | Wt 179.2 lb

## 2016-11-20 DIAGNOSIS — C50412 Malignant neoplasm of upper-outer quadrant of left female breast: Secondary | ICD-10-CM | POA: Diagnosis not present

## 2016-11-20 DIAGNOSIS — I10 Essential (primary) hypertension: Secondary | ICD-10-CM | POA: Diagnosis not present

## 2016-11-20 DIAGNOSIS — E785 Hyperlipidemia, unspecified: Secondary | ICD-10-CM | POA: Insufficient documentation

## 2016-11-20 DIAGNOSIS — E119 Type 2 diabetes mellitus without complications: Secondary | ICD-10-CM | POA: Diagnosis not present

## 2016-11-20 DIAGNOSIS — Z170 Estrogen receptor positive status [ER+]: Secondary | ICD-10-CM

## 2016-11-20 DIAGNOSIS — Z9889 Other specified postprocedural states: Secondary | ICD-10-CM | POA: Diagnosis not present

## 2016-11-20 NOTE — Progress Notes (Signed)
Radiation Oncology         ([phone]) [phone] ________________________________  Name: Wendy [name] MRN: [id number]  Date: 11/20/2016  DOB: Aug 24, 1941  [id number] Lorretta Harp, MD  Erroll Luna, MD     REFERRING PHYSICIAN: Erroll Luna, MD   DIAGNOSIS: The primary encounter diagnosis was Malignant neoplasm of upper-outer quadrant of left female breast, unspecified estrogen receptor status ([place]). A diagnosis of Malignant neoplasm of upper-outer quadrant of left breast in female, estrogen receptor positive ([place]) was also pertinent to this visit.   HISTORY OF PRESENT ILLNESS: Wendy [name] is a 76 y.o. female seen at the request of Dr. Brantley Stage. The patient had a diagnostic mammogram on 11/01/2016 to investigate a self palpated mass in the left breast. An ultrasound of the left breast was performed at that time. These scans revealed a mass in the 1 o'clock region of the left breast and abnormal axillary lymph node suspicious for invasive mammary carcinoma with axillary metastasis.  Biopsy of the upper outer left breast at the 1 o'clock position on 11/07/2016 revealed invasive ductal carcinoma and DCIS. Partially sampled inferior left axillary lymph node was benign. Pathology appeared to be grade 3, ER 80% / PR 0% / HER-2 negative / Ki67 90%.   The patient has not undergone breast MRI.   She has not yet been scheduled for surgery. She is scheduled to meet with medical oncologist, Dr. Burr Medico this Thursday, 2/22 to decide on lumpectomy up front versus neo-adjuvant chemotherapy.  The patient is here for evaluation and discussion of radiation therapy in the management of her disease.    PREVIOUS RADIATION THERAPY: No   PAST MEDICAL HISTORY:  Past Medical History:  Diagnosis Date  . BREAST MASS, LEFT 04/13/2008   Per note 04/13/2008: U/S guided core needle biopsy of left breast on 02/21/08. Benign per pathologist read but was referred to Dr. Brantley Stage for surgical excision and patient reports this was excised.     . Colon polyps   . DM (diabetes mellitus) ([place])   . 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 FRACTURE, TOE, RIGHT 10/08/2007  . Heartburn 11/29/2006   Qualifier: Diagnosis of  By: Herma Ard    . HLD (hyperlipidemia)   . HTN (hypertension)   . SYSTOLIC MURMUR 78/93/8101       PAST SURGICAL HISTORY: Past Surgical History:  Procedure Laterality Date  . BREAST LUMPECTOMY Left      FAMILY HISTORY:  Family History  Problem Relation Age of Onset  . Diabetes Sister     x 4  . Diabetes Brother   . Diabetes Paternal Grandmother   . Diabetes Paternal Aunt   . Heart disease Brother   . Heart disease Sister   . Emphysema Brother      SOCIAL HISTORY:  reports that she has never smoked. She quit smokeless tobacco use about 2 years ago. Her smokeless tobacco use included Snuff. She reports that she does not drink alcohol or use drugs.   ALLERGIES: Patient has no known allergies.   MEDICATIONS:  Current Outpatient Prescriptions  Medication Sig Dispense Refill  . amLODipine (NORVASC) 10 MG tablet TAKE 1 TABLET (10 MG TOTAL) BY MOUTH DAILY. 30 tablet 5  . aspirin 81 MG tablet Take 81 mg by mouth daily.      . B-D INS SYRINGE 0.5CC/31GX5/16 31G X 5/16" 0.5 ML MISC USE AS DIRECTED 4 TIMES A DAY 300 each 1  . Calcium Carbonate-Vit  D-Min 600-400 MG-UNIT TABS Take 1 tablet by mouth daily.     . carvedilol (COREG) 25 MG tablet TAKE 1 TABLET BY MOUTH TWICE A DAY WITH A MEAL 180 tablet 1  . cloNIDine (CATAPRES) 0.3 MG tablet TAKE 1 TABLET BY MOUTH 2 TIMES DAILY. 180 tablet 1  . [facility] SENNA PLUS 8.6-50 MG per tablet TAKE 1 TABLET BY MOUTH AT BEDTIME. 30 tablet 3  . hydrochlorothiazide (HYDRODIURIL) 25 MG tablet Take 1 tablet (25 mg total) by mouth daily. 30 tablet 5  . insulin aspart (NOVOLOG) 100 UNIT/ML injection Inject 6-10 Units into the skin 3 (three) times daily before meals. 2 vial 5  . Insulin Glargine (BASAGLAR KWIKPEN) 100  UNIT/ML SOPN Inject 24 units into the skin in the am. 5 pen 3  . Insulin Syringe-Needle U-100 (B-D INS SYRINGE 0.5CC/30GX1/2") 30G X 1/2" 0.5 ML MISC USE AS DIRECTED 4 TIMES A DAY 300 each 3  . lisinopril (PRINIVIL,ZESTRIL) 40 MG tablet TAKE 1 TABLET (40 MG TOTAL) BY MOUTH DAILY. 30 tablet 0  . metFORMIN (GLUCOPHAGE) 500 MG tablet Take 2 tablets (1,000 mg total) by mouth daily with supper. 180 tablet 3  . rosuvastatin (CRESTOR) 5 MG tablet Take 1 tablet (5 mg total) by mouth daily. -- need labs for further refills 90 tablet 0  . insulin aspart (NOVOLOG) 100 UNIT/ML injection Inject under skin 6-10 units before the main 3 meals (Patient not taking: Reported on 11/20/2016) 30 mL 3   No current facility-administered medications for this encounter.      REVIEW OF SYSTEMS: On review of systems, the patient reports that she is doing well overall. She denies any chest pain, shortness of breath, cough, fevers, chills, night sweats, unintended weight changes. She denies any bowel or bladder disturbances, and denies abdominal pain, nausea or vomiting. She denies lymphedema. She reports soreness at the biopsy site. A complete review of systems is obtained and is otherwise negative.  Menarche age 59. G4P4 No tobacco use ever. No alcohol or drug use. She is not aware of any family history of breast cancer.   PHYSICAL EXAM:  Wt Readings from Last 3 Encounters:  11/20/16 179 lb 3.2 oz (81.3 kg)  11/03/16 177 lb (80.3 kg)  10/10/16 179 lb 1.9 oz (81.2 kg)   Temp Readings from Last 3 Encounters:  11/20/16 98.6 F (37 C) (Oral)  11/03/16 98 F (36.7 C) (Oral)  10/10/16 98.2 F (36.8 C) (Oral)   BP Readings from Last 3 Encounters:  11/20/16 (!) 193/90  11/03/16 (!) 162/72  10/10/16 128/62   Pulse Readings from Last 3 Encounters:  11/20/16 90  11/03/16 (!) 110  10/10/16 75   Pain Assessment Pain Score: 0-No pain/10  In general this is a well appearing african-american in no acute distress. She  is alert and oriented x4 and appropriate throughout the examination. HEENT reveals that the patient is normocephalic, atraumatic. EOMs are intact. PERRLA. Skin is intact without any evidence of gross lesions. Cardiovascular exam reveals a regular rate and rhythm, no clicks rubs or murmurs are auscultated. Chest is clear to auscultation bilaterally. Breast exam reveals easily palpable 3 cm tumor in the upper outer quadrant of the left breast just outside the nipple/areolar region. No other suspicious masses in either breast and no axillary adenopathy. Lymphatic assessment is performed and does not reveal any adenopathy in the cervical, supraclavicular, axillary, or inguinal chains. Abdomen has active bowel sounds in all quadrants and is intact. The abdomen is soft, non tender,  non distended. Lower extremities are negative for pretibial pitting edema, deep calf tenderness, cyanosis or clubbing.   ECOG = 0  0 - Asymptomatic (Fully active, able to carry on all predisease activities without restriction)  1 - Symptomatic but completely ambulatory (Restricted in physically strenuous activity but ambulatory and able to carry out work of a light or sedentary nature. For example, light housework, office work)  2 - Symptomatic, <50% in bed during the day (Ambulatory and capable of all self care but unable to carry out any work activities. Up and about more than 50% of waking hours)  3 - Symptomatic, >50% in bed, but not bedbound (Capable of only limited self-care, confined to bed or chair 50% or more of waking hours)  4 - Bedbound (Completely disabled. Cannot carry on any self-care. Totally confined to bed or chair)  5 - Death   Eustace Pen MM, Creech RH, Tormey DC, et al. [phone]). "Toxicity and response criteria of the [facility]". [place] Oncol. 5 (6): 649-55    LABORATORY DATA:  Lab Results  Component Value Date   WBC 7.1 07/13/2015   HGB 11.9 (L) 07/13/2015   HCT 35.1 (L)  07/13/2015   MCV 81.9 07/13/2015   PLT 322.0 07/13/2015   Lab Results  Component Value Date   NA 137 10/20/2015   K 4.4 10/20/2015   CL 99 10/20/2015   CO2 29 10/20/2015   Lab Results  Component Value Date   ALT 5 10/20/2015   AST 6 10/20/2015   ALKPHOS 74 10/20/2015   BILITOT 0.3 10/20/2015      RADIOGRAPHY: US Breast Ltd Uni Left Inc Axilla  Result Date: 11/01/2016 CLINICAL DATA:  76 year old female complaining of a palpable mass in the left breast. EXAM: 2D DIGITAL DIAGNOSTIC LEFT MAMMOGRAM WITH CAD AND ADJUNCT TOMO ULTRASOUND LEFT BREAST COMPARISON:  Previous exam(s). ACR Breast Density Category b: There are scattered areas of fibroglandular density. FINDINGS: In the upper-outer quadrant of the anterior third of the left breast is a developing 3.1 x 2.1 x 1.4 cm mass associated with pleomorphic calcifications. There is a 8 mm prominent lymph node seen in the left axilla. Mammographic images were processed with CAD. On physical exam, I palpate a discrete mass in the left breast at 1 o'clock 2 cm from the nipple. Targeted ultrasound is performed, showing an irregular hypoechoic mass in the left breast at 1 o'clock 2 cm from the nipple measuring 3.3 x 2.0 x 2.7 cm. Sonographic evaluation the left axilla shows a 1.8 cm lymph node with a focally thickened cortex measuring measuring 5 mm. IMPRESSION: The mass in the 1 o'clock region of the left breast and abnormal axillary lymph node are suspicious for invasive mammary carcinoma with axillary metastasis. RECOMMENDATION: Ultrasound-guided core biopsies of the left breast mass as well as the abnormal left axillary lymph node is recommended. The biopsies will be scheduled at the patient's convenience. I have discussed the findings and recommendations with the patient. Results were also provided in writing at the conclusion of the visit. If applicable, a reminder letter will be sent to the patient regarding the next appointment. BI-RADS CATEGORY  5:  Highly suggestive of malignancy. Electronically Signed   By: Lillia Mountain M.D.   On: 11/01/2016 13:35   Mm Diag Breast Tomo Uni Left  Result Date: 11/01/2016 CLINICAL DATA:  76 year old female complaining of a palpable mass in the left breast. EXAM: 2D DIGITAL DIAGNOSTIC LEFT MAMMOGRAM WITH CAD AND ADJUNCT TOMO  ULTRASOUND LEFT BREAST COMPARISON:  Previous exam(s). ACR Breast Density Category b: There are scattered areas of fibroglandular density. FINDINGS: In the upper-outer quadrant of the anterior third of the left breast is a developing 3.1 x 2.1 x 1.4 cm mass associated with pleomorphic calcifications. There is a 8 mm prominent lymph node seen in the left axilla. Mammographic images were processed with CAD. On physical exam, I palpate a discrete mass in the left breast at 1 o'clock 2 cm from the nipple. Targeted ultrasound is performed, showing an irregular hypoechoic mass in the left breast at 1 o'clock 2 cm from the nipple measuring 3.3 x 2.0 x 2.7 cm. Sonographic evaluation the left axilla shows a 1.8 cm lymph node with a focally thickened cortex measuring measuring 5 mm. IMPRESSION: The mass in the 1 o'clock region of the left breast and abnormal axillary lymph node are suspicious for invasive mammary carcinoma with axillary metastasis. RECOMMENDATION: Ultrasound-guided core biopsies of the left breast mass as well as the abnormal left axillary lymph node is recommended. The biopsies will be scheduled at the patient's convenience. I have discussed the findings and recommendations with the patient. Results were also provided in writing at the conclusion of the visit. If applicable, a reminder letter will be sent to the patient regarding the next appointment. BI-RADS CATEGORY  5: Highly suggestive of malignancy. Electronically Signed   By: Lillia Mountain M.D.   On: 11/01/2016 13:35   Mm Clip Placement Left  Result Date: 11/07/2016 CLINICAL DATA:  Two ultrasound-guided biopsies were performed today, of a  suspicious left breast mass and of a left axillary lymph node. EXAM: DIAGNOSTIC LEFT MAMMOGRAM POST ULTRASOUND BIOPSY COMPARISON:  Previous exam(s). FINDINGS: Mammographic images were obtained following ultrasound guided biopsy of a left breast mass in the periareolar upper outer quadrant. A ribbon shaped biopsy clip is satisfactorily positioned within the mass. A left axillary HydroMARK biopsy clip projects superficial to the left axillary lymph nodes with cortical thickening and likely deployed slightly external/superficial to the biopsied lymph node. IMPRESSION: Ribbon shaped biopsy clip within the left breast mass. HydroMARK biopsy clip appears slightly superficial to the left axillary lymph nodes with cortical thickening. Final Assessment: Post Procedure Mammograms for Marker Placement Electronically Signed   By: Curlene Dolphin M.D.   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 This was found to be concordant by Dr. Curlene Dolphin. Pathology results were discussed with the patient by telephone. The patient reported doing well after the biopsy. Post biopsy instructions and care were reviewed and questions were answered. The patient was encouraged to call [facility] for any additional concerns. Surgical consultation has been arranged with Dr. Erroll Luna at [facility] on November 13, 2016. Pathology results reported by Susa Raring RN, BSN on 11/09/2016. Electronically Signed   By: Curlene Dolphin M.D.   On: 11/09/2016 07:59   Result Date: 11/10/2016 CLINICAL DATA:  Suspicious palpable left breast mass at 1 to 2 o'clock position. Biopsy was recommended. EXAM: ULTRASOUND GUIDED LEFT BREAST CORE  NEEDLE BIOPSY COMPARISON:  Previous exam(s). FINDINGS: I met with the patient and we discussed the procedure of ultrasound-guided biopsy, including benefits and alternatives. We discussed the  high likelihood of a successful procedure. We discussed the risks of the procedure, including infection, bleeding, tissue injury, clip migration, and inadequate sampling. Informed written consent was given. The usual time-out protocol was performed immediately prior to the procedure. Using sterile technique and 1% Lidocaine as local anesthetic, under direct ultrasound visualization, a 12 gauge spring-loaded device was used to perform biopsy of an irregular hypoechoic palpable mass using a lateral to medial approach. At the conclusion of the procedure a tissue marker clip was deployed into the biopsy cavity. Follow up 2 view mammogram was performed and dictated separately. IMPRESSION: Ultrasound guided biopsy of the left breast. No apparent complications. Electronically Signed: By: Curlene Dolphin M.D. 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 This was found to be concordant by Dr. Curlene Dolphin. Pathology results were discussed with the patient by telephone. The patient reported doing well after the biopsy. Post biopsy instructions and care were reviewed and questions were answered. The patient was encouraged to call [facility] for any additional concerns. Surgical consultation has been arranged with Dr. Erroll Luna at [facility] on November 13, 2016. Pathology results reported by Susa Raring RN, BSN on 11/09/2016. Electronically Signed   By: Curlene Dolphin M.D.   On: 11/09/2016 08:00   Result Date:  11/10/2016 CLINICAL DATA:  Suspicious left axillary lymph node identified in a patient with a suspicious palpable upper-outer quadrant left breast mass. Biopsy was recommended. EXAM: ULTRASOUND GUIDED CORE NEEDLE BIOPSY OF A LEFT AXILLARY NODE COMPARISON:  Previous exam(s). FINDINGS: I met with the patient and we discussed the procedure of ultrasound-guided biopsy, including benefits and alternatives. We discussed the high likelihood of a successful procedure. We discussed the risks of the procedure, including infection, bleeding, tissue injury, clip migration, and inadequate sampling. Informed written consent was given. The usual time-out protocol was performed immediately prior to the procedure. Using sterile technique and 1% Lidocaine as local anesthetic, under direct ultrasound visualization, a 14 gauge spring-loaded device was used to perform biopsy of a lymph node with cortical thickening in the deep aspect of the left axilla, level 1 using a lateral to medial approach. At the conclusion of the procedure a HydroMARK tissue marker clip was deployed into the biopsy cavity. Follow up 2 view mammogram was performed and dictated separately. IMPRESSION: Ultrasound guided biopsy of left axillary lymph node. No apparent complications. Electronically Signed: By: Curlene Dolphin M.D. 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 She appears to be a good candidate for breast conservation treatment up front or neo-adjuvant chemotherapy.   I discussed with the patient the role of adjuvant radiation treatment in this setting. We discussed the potential benefit of radiation treatment, especially with regards to local control of the patient's tumor. We also discussed the possible side effects and risks of such a treatment as well.  All of the patient's questions were answered. The patient wishes to proceed with radiation  treatment at the appropriate time.  PLAN: I look forward to seeing the patient postoperatively to review her case and further discuss and coordinate  an anticipated course of radiation treatment.  The patient has not yet been scheduled for surgery. She is scheduled to meet with medical oncologist Dr. Burr Medico this Thursday, 2/22, to decide on lumpectomy up front versus neo-adjuvant chemotherapy.  ------------------------------------------------  Jodelle Gross, MD, PhD  This document serves as a record of services personally performed by Kyung Rudd, MD. It was created on his behalf by Arlyce Harman, a trained medical scribe. The creation of this record is based on the scribe's personal observations and the provider's statements to them. This document has been checked and approved by the attending provider.

## 2016-11-20 NOTE — Addendum Note (Signed)
Encounter addended by: Doreen Beam, RN on: 11/20/2016  2:56 PM<BR>    Actions taken: Patient Education assessment filed as incomplete, Patient Education assessment filed

## 2016-11-21 ENCOUNTER — Other Ambulatory Visit: Payer: Self-pay | Admitting: Surgery

## 2016-11-21 DIAGNOSIS — Z170 Estrogen receptor positive status [ER+]: Principal | ICD-10-CM

## 2016-11-21 DIAGNOSIS — C50412 Malignant neoplasm of upper-outer quadrant of left female breast: Secondary | ICD-10-CM

## 2016-11-22 NOTE — Progress Notes (Signed)
[name]  [phone] [phone]  Clinic New Consult Note   Patient Care Team: Binnie Rail, MD as PCP - General (Internal Medicine) Philemon Kingdom, MD as Consulting Physician (Internal Medicine) Jalene Mullet, MD as Consulting Physician (Ophthalmology) Erroll Luna, MD as Consulting Physician (General Surgery) Kyung Rudd, MD as Consulting Physician (Radiation Oncology) [name], MD as Consulting Physician (Hematology) 11/23/2016  CHIEF COMPLAINTS/PURPOSE OF CONSULTATION:  Invasive Ductal Carcinoma of the left breast  Oncology History   Cancer Staging Malignant neoplasm of upper-outer quadrant of left breast in female, estrogen receptor positive ([place]) Staging form: Breast, AJCC 8th Edition - Clinical: Stage IIB (cT2, cN0, cM0, G3, ER: Positive, PR: Negative, HER2: Negative) - Signed by [name], MD on 11/23/2016       Malignant neoplasm of upper-outer quadrant of left breast in female, estrogen receptor positive ([place])   11/01/2016 Mammogram    Diagnostic mammogram and ultrasound of left breast and axilla showed a 3.1 x 2.1 x 1.4 cm (3.3 x 2.0 x 2.7 cm by ultrasound) mass in the upper outer quadrant of the anterior third of the left breast, associated with pleomorphic calcification. There is a 8 mm (1.8cm by [name]) prominent lymph node in the left axilla.      11/07/2016 Initial Biopsy    Left breast 1:00 position biopsy showed invasive ductal carcinoma and DCIS, G3, left axillary node biopsy was negative.      11/07/2016 Receptors her2    ER 80% positive, PR negative, HER-2 negative, Ki-67 90%      11/20/2016 Initial Diagnosis    Malignant neoplasm of upper-outer quadrant of left breast in female, estrogen receptor positive ([place])      HISTORY OF PRESENTING ILLNESS:  Wendy [name] 76 y.o. female is here because of invasive ductal carcinoma of the left breast. She is accompanied by her son to my clinic today. She was referred by her breast  surgeon Dr. Brantley Stage.   About 2 months ago, the patient noted a mass on her left breast. She underwent a mammogram and breast US on 11/01/2016, which showed a 3.1 cm mass in the upper outer quadrant of the left breast with pleomorphic calcifications. Imaging also noted abnormal appearing lymph nodes in the left axilla. A core biopsy was done on 11/07/2016, which revealed invasive ductal carcinoma ER positive PR negative with a Ki-67 of 90%. On November 13, 2016 she met with Dr. Brantley Stage to discuss a lumpectomy. She presents for further treatment.   She presents with her son today. She felt the lump herself about 2 months ago. She noticed last month that it had gotten a little bigger. So she went to her PCP, who ordered a mammogram. She does not get mammograms every year. Her last mammogram was May 2017, which showed no cancer. She had some pain and itching in her left nipple before her diagnosis. She has some lower back pain if she is laying down too long, but that has been present for a while. Denies any new bone or joint pain, weight loss, breast swelling, nipple discharge, or any other concerns. She is very active and takes care of herself and her husband.   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 They have a nurse for the rest of the week. He has dementia as well as numerous other health problems. Her son has moved in with her to help with her husband. She has a history of HTN and  DM. Her DM is not well controlled; she takes medication for this. Her BP is also not well controlled on lisinopril. She has never had a heart attack or stroke. No surgeries in the past other than the removal of her benign lesion. No family history of breast cancer. Her father did have a brain tumor at the age of 36, which he passed from. Never smoker. She does not drink alcohol. She has 9 grandchildren.   GYN HISTORY  Menarchal: 76 y.o LMP: around 76 y.o Contraceptive: n/a HRT: No GP: 4 pregnancies, 4  children (3 boys, 1 girl). 1st child at age 48  MEDICAL HISTORY:  Past Medical History:  Diagnosis Date  . BREAST MASS, LEFT 04/13/2008   Per note 04/13/2008: U/S guided core needle biopsy of left breast on 02/21/08. Benign per pathologist read but was referred to Dr. Brantley Stage for surgical excision and patient reports this was excised.    . Colon polyps   . DM (diabetes mellitus) ([place])   . 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 FRACTURE, TOE, RIGHT 10/08/2007  . Heartburn 11/29/2006   Qualifier: Diagnosis of  By: Herma Ard    . HLD (hyperlipidemia)   . HTN (hypertension)   . SYSTOLIC MURMUR 03/27/9484    SURGICAL HISTORY: Past Surgical History:  Procedure Laterality Date  . BREAST LUMPECTOMY Left     SOCIAL HISTORY: Social History   Social History  . Marital status: Married    Spouse name: N/A  . Number of children: 4  . Years of education: N/A   Occupational History  . [employer]   Social History Main Topics  . Smoking status: Never Smoker  . Smokeless tobacco: Former Systems developer    Types: Snuff    Quit date: 10/02/2014     Comment: Quit 10/02/2014  previous 1 box per week of snuff   . Alcohol use No  . Drug use: No  . Sexual activity: Not Currently   Other Topics Concern  . Not on file   Social History Narrative  . No narrative on file    FAMILY HISTORY: Family History  Problem Relation Age of Onset  . Diabetes Sister     x 4  . Diabetes Brother   . Diabetes Paternal Grandmother   . Diabetes Paternal Aunt   . Heart disease Brother   . Heart disease Sister   . Emphysema Brother   . Cancer Father 25    brain tumor     ALLERGIES:  has No Known Allergies.  MEDICATIONS:  Current Outpatient Prescriptions  Medication Sig Dispense Refill  . amLODipine (NORVASC) 10 MG tablet TAKE 1 TABLET (10 MG TOTAL) BY MOUTH DAILY. 30 tablet 5  . aspirin 81 MG tablet Take 81 mg by mouth daily.      .  B-D INS SYRINGE 0.5CC/31GX5/16 31G X 5/16" 0.5 ML MISC USE AS DIRECTED 4 TIMES A DAY 300 each 1  . Calcium Carbonate-Vit D-Min 600-400 MG-UNIT TABS Take 1 tablet by mouth daily.     . carvedilol (COREG) 25 MG tablet TAKE 1 TABLET BY MOUTH TWICE A DAY WITH A MEAL 180 tablet 1  . cloNIDine (CATAPRES) 0.3 MG tablet TAKE 1 TABLET BY MOUTH 2 TIMES DAILY. 180 tablet 1  . [facility] SENNA PLUS 8.6-50 MG per tablet TAKE 1 TABLET BY MOUTH AT BEDTIME. 30 tablet 3  . hydrochlorothiazide (HYDRODIURIL) 25 MG tablet Take 1  tablet (25 mg total) by mouth daily. 30 tablet 5  . insulin aspart (NOVOLOG) 100 UNIT/ML injection Inject 6-10 Units into the skin 3 (three) times daily before meals. 2 vial 5  . Insulin Glargine (BASAGLAR KWIKPEN) 100 UNIT/ML SOPN Inject 24 units into the skin in the am. 5 pen 3  . Insulin Syringe-Needle U-100 (B-D INS SYRINGE 0.5CC/30GX1/2") 30G X 1/2" 0.5 ML MISC USE AS DIRECTED 4 TIMES A DAY 300 each 3  . lisinopril (PRINIVIL,ZESTRIL) 40 MG tablet TAKE 1 TABLET (40 MG TOTAL) BY MOUTH DAILY. 30 tablet 0  . metFORMIN (GLUCOPHAGE) 500 MG tablet Take 2 tablets (1,000 mg total) by mouth daily with supper. 180 tablet 3  . rosuvastatin (CRESTOR) 5 MG tablet Take 1 tablet (5 mg total) by mouth daily. -- need labs for further refills 90 tablet 0   No current facility-administered medications for this visit.     REVIEW OF SYSTEMS:   Constitutional: Denies fevers, chills or abnormal night sweats Eyes: Denies blurriness of vision, double vision or watery eyes Ears, nose, mouth, throat, and face: Denies mucositis or sore throat Respiratory: Denies cough, dyspnea or wheezes Cardiovascular: Denies palpitation, chest discomfort or lower extremity swelling Gastrointestinal:  Denies nausea, heartburn or change in bowel habits Skin: Denies abnormal skin rashes Lymphatics: Denies new lymphadenopathy or easy bruising Neurological:Denies numbness, tingling or new weaknesses Behavioral/Psych: Mood is stable, no  new changes  Musculoskeletal: (+) lower back pain Breast: (+) breast pain and itching at L nipple All other systems were reviewed with the patient and are negative.  PHYSICAL EXAMINATION: ECOG PERFORMANCE STATUS: 0 - Asymptomatic  Vitals:   11/23/16 1549  BP: (!) 152/57  Pulse: 71  Resp: 18  Temp: 97.9 F (36.6 C)   Filed Weights   11/23/16 1549  Weight: 179 lb (81.2 kg)   GENERAL:alert, no distress and comfortable SKIN: skin color, texture, turgor are normal, no rashes or significant lesions EYES: normal, conjunctiva are pink and non-injected, sclera clear OROPHARYNX:no exudate, no erythema and lips, buccal mucosa, and tongue normal  NECK: supple, thyroid normal size, non-tender, without nodularity LYMPH:  no palpable lymphadenopathy in the cervical, axillary or inguinal LUNGS: clear to auscultation and percussion with normal breathing effort HEART: regular rate & rhythm and no murmurs and no lower extremity edema ABDOMEN:abdomen soft, non-tender and normal bowel sounds Musculoskeletal:no cyanosis of digits and no clubbing  PSYCH: alert & oriented x 3 with fluent speech NEURO: no focal motor/sensory deficits Breasts: Breast inspection showed them to be symmetrical with no nipple discharge. Palpable  3x3 cm lump in upper outer quad of left breast, just above nipple. Little tender, no skin change or nipple retraction. Old scar in inferior of L breast from previous lumpectomy  LABORATORY DATA:  I have reviewed the data as listed CBC Latest Ref Rng & Units 07/13/2015 03/30/2014 04/03/2013  WBC 4.0 - 10.5 K/uL 7.1 7.3 7.9  Hemoglobin 12.0 - 15.0 g/dL 11.9(L) 12.4 13.2  Hematocrit 36.0 - 46.0 % 35.1(L) 35.4(L) 38.8  Platelets 150.0 - 400.0 K/uL 322.0 307 334   CMP Latest Ref Rng & Units 10/20/2015 07/13/2015 03/30/2014  Glucose 70 - 99 mg/dL 263(H) 153(H) 124(H)  BUN 6 - 23 mg/dL 28(H) 23 21  Creatinine 0.40 - 1.20 mg/dL 1.29(H) 1.17 1.08  Sodium 135 - 145 mEq/L 137 140 142    Potassium 3.5 - 5.1 mEq/L 4.4 4.0 3.9  Chloride 96 - 112 mEq/L 99 102 100  CO2 19 - 32 mEq/L 29 28  28  Calcium 8.4 - 10.5 mg/dL 9.4 9.6 9.6  Total Protein 6.0 - 8.3 g/dL 7.6 7.2 7.3  Total Bilirubin 0.2 - 1.2 mg/dL 0.3 0.3 0.3  Alkaline Phos 39 - 117 U/L 74 71 78  AST 0 - 37 U/L 6 6 12   ALT 0 - 35 U/L 5 5 <8   PATHOLOGY: Diagnosis 11/07/2016 1. Breast, left, needle core biopsy, 1:00 o'clock upper outer quadrant - INVASIVE DUCTAL CARCINOMA. - DUCTAL CARCINOMA IN SITU - SEE COMMENT. 2. Lymph node, needle/core biopsy, inferior left axilla - ONE PARTIALLY SAMPLED BENIGN LYMPH NODE WITH NO TUMOR SEEN. Microscopic Comment 1. Although definitive grading of breast carcinoma is best done on excision, the features of the invasive tumor from the left 1 o'clock upper outer quadrant breast biopsy are compatible with a grade 3 breast carcinoma. Breast prognostic markers will be performed and reported in an addendum. Findings are called to [facility] on 11/08/2016. Dr. Lyndon Code has seen the left 1:00 breast biopsy in consultation with agreement. 2. The findings from the left inferior axillary lymph node are called to the [facility] on 11/08/2016. Dr. Lyndon Code has seen the lymph node biopsy in consultation with agreement. (RH:kh 11/08/16) 1. FLUORESCENCE IN-SITU HYBRIDIZATION Results: HER2 - NEGATIVE RATIO OF HER2/CEP17 SIGNALS 1.71 AVERAGE HER2 COPY NUMBER PER CELL 2.90 Reference Range: NEGATIVE HER2/CEP17 Ratio <2.0 and average HER2 copy number <4.0 EQUIVOCAL HER2/CEP17 Ratio <2.0 and average HER2 copy number >=4.0 and <6.0 POSITIVE HER2/CEP17 Ratio >=2.0 or <2.0 and average HER2 copy number >=6.0 1. PROGNOSTIC INDICATORS Results: IMMUNOHISTOCHEMICAL AND MORPHOMETRIC ANALYSIS PERFORMED MANUALLY Estrogen Receptor: 80%, POSITIVE, WEAK STAINING INTENSITY Progesterone Receptor: 0%, NEGATIVE Proliferation Marker Ki67: 90% COMMENT: The negative hormone receptor study(ies) in  this case has An internal positive control. REFERENCE RANGE ESTROGEN RECEPTOR NEGATIVE 0% POSITIVE =>1% REFERENCE RANGE PROGESTERONE RECEPTOR NEGATIVE 0% POSITIVE =>1%  RADIOGRAPHIC STUDIES: I have personally reviewed the radiological images as listed and agreed with the findings in the report. US Breast Ltd Uni Left Inc Axilla  Result Date: 11/01/2016 CLINICAL DATA:  76 year old female complaining of a palpable mass in the left breast. EXAM: 2D DIGITAL DIAGNOSTIC LEFT MAMMOGRAM WITH CAD AND ADJUNCT TOMO ULTRASOUND LEFT BREAST COMPARISON:  Previous exam(s). ACR Breast Density Category b: There are scattered areas of fibroglandular density. FINDINGS: In the upper-outer quadrant of the anterior third of the left breast is a developing 3.1 x 2.1 x 1.4 cm mass associated with pleomorphic calcifications. There is a 8 mm prominent lymph node seen in the left axilla. Mammographic images were processed with CAD. On physical exam, I palpate a discrete mass in the left breast at 1 o'clock 2 cm from the nipple. Targeted ultrasound is performed, showing an irregular hypoechoic mass in the left breast at 1 o'clock 2 cm from the nipple measuring 3.3 x 2.0 x 2.7 cm. Sonographic evaluation the left axilla shows a 1.8 cm lymph node with a focally thickened cortex measuring measuring 5 mm. IMPRESSION: The mass in the 1 o'clock region of the left breast and abnormal axillary lymph node are suspicious for invasive mammary carcinoma with axillary metastasis. RECOMMENDATION: Ultrasound-guided core biopsies of the left breast mass as well as the abnormal left axillary lymph node is recommended. The biopsies will be scheduled at the patient's convenience. I have discussed the findings and recommendations with the patient. Results were also provided in writing at the conclusion of the visit. If applicable, a reminder letter will be sent to the patient  regarding the next appointment. BI-RADS CATEGORY  5: Highly suggestive of  malignancy. Electronically Signed   By: Lillia Mountain M.D.   On: 11/01/2016 13:35   Mm Diag Breast Tomo Uni Left  Result Date: 11/01/2016 CLINICAL DATA:  76 year old female complaining of a palpable mass in the left breast. EXAM: 2D DIGITAL DIAGNOSTIC LEFT MAMMOGRAM WITH CAD AND ADJUNCT TOMO ULTRASOUND LEFT BREAST COMPARISON:  Previous exam(s). ACR Breast Density Category b: There are scattered areas of fibroglandular density. FINDINGS: In the upper-outer quadrant of the anterior third of the left breast is a developing 3.1 x 2.1 x 1.4 cm mass associated with pleomorphic calcifications. There is a 8 mm prominent lymph node seen in the left axilla. Mammographic images were processed with CAD. On physical exam, I palpate a discrete mass in the left breast at 1 o'clock 2 cm from the nipple. Targeted ultrasound is performed, showing an irregular hypoechoic mass in the left breast at 1 o'clock 2 cm from the nipple measuring 3.3 x 2.0 x 2.7 cm. Sonographic evaluation the left axilla shows a 1.8 cm lymph node with a focally thickened cortex measuring measuring 5 mm. IMPRESSION: The mass in the 1 o'clock region of the left breast and abnormal axillary lymph node are suspicious for invasive mammary carcinoma with axillary metastasis. RECOMMENDATION: Ultrasound-guided core biopsies of the left breast mass as well as the abnormal left axillary lymph node is recommended. The biopsies will be scheduled at the patient's convenience. I have discussed the findings and recommendations with the patient. Results were also provided in writing at the conclusion of the visit. If applicable, a reminder letter will be sent to the patient regarding the next appointment. BI-RADS CATEGORY  5: Highly suggestive of malignancy. Electronically Signed   By: Lillia Mountain M.D.   On: 11/01/2016 13:35   Mm Clip Placement Left  Result Date: 11/07/2016 CLINICAL DATA:  Two ultrasound-guided biopsies were performed today, of a suspicious left breast  mass and of a left axillary lymph node. EXAM: DIAGNOSTIC LEFT MAMMOGRAM POST ULTRASOUND BIOPSY COMPARISON:  Previous exam(s). FINDINGS: Mammographic images were obtained following ultrasound guided biopsy of a left breast mass in the periareolar upper outer quadrant. A ribbon shaped biopsy clip is satisfactorily positioned within the mass. A left axillary HydroMARK biopsy clip projects superficial to the left axillary lymph nodes with cortical thickening and likely deployed slightly external/superficial to the biopsied lymph node. IMPRESSION: Ribbon shaped biopsy clip within the left breast mass. HydroMARK biopsy clip appears slightly superficial to the left axillary lymph nodes with cortical thickening. Final Assessment: Post Procedure Mammograms for Marker Placement Electronically Signed   By: Curlene Dolphin M.D.   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 This was found to be concordant by Dr. Curlene Dolphin. Pathology results were discussed with the patient by telephone. The patient reported doing well after the biopsy. Post biopsy instructions and care were reviewed and questions were answered. The patient was encouraged to call [facility] for any additional concerns. Surgical consultation has been arranged with Dr. Erroll Luna at [facility] on November 13, 2016. Pathology results reported by Susa Raring RN, BSN on 11/09/2016. Electronically Signed   By: Manuela Schwartz  Turner M.D.   On: 11/09/2016 07:59   Result Date: 11/10/2016 CLINICAL DATA:  Suspicious palpable left breast mass at 1 to 2 o'clock position. Biopsy was recommended. EXAM: ULTRASOUND GUIDED LEFT BREAST CORE NEEDLE BIOPSY  COMPARISON:  Previous exam(s). FINDINGS: I met with the patient and we discussed the procedure of ultrasound-guided biopsy, including benefits and alternatives. We discussed the high likelihood of a successful procedure. We discussed the risks of the procedure, including infection, bleeding, tissue injury, clip migration, and inadequate sampling. Informed written consent was given. The usual time-out protocol was performed immediately prior to the procedure. Using sterile technique and 1% Lidocaine as local anesthetic, under direct ultrasound visualization, a 12 gauge spring-loaded device was used to perform biopsy of an irregular hypoechoic palpable mass using a lateral to medial approach. At the conclusion of the procedure a tissue marker clip was deployed into the biopsy cavity. Follow up 2 view mammogram was performed and dictated separately. IMPRESSION: Ultrasound guided biopsy of the left breast. No apparent complications. Electronically Signed: By: Curlene Dolphin M.D. 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 This was found to be concordant by Dr. Curlene Dolphin. Pathology results were discussed with the patient by telephone. The patient reported doing well after the biopsy. Post biopsy instructions and care were reviewed and questions were answered. The patient was encouraged to call [facility] for any additional concerns. Surgical consultation has been arranged with Dr. Erroll Luna at [facility] on November 13, 2016. Pathology results reported by Susa Raring RN, BSN on 11/09/2016. Electronically Signed   By: Curlene Dolphin M.D.   On: 11/09/2016 08:00   Result Date:  11/10/2016 CLINICAL DATA:  Suspicious left axillary lymph node identified in a patient with a suspicious palpable upper-outer quadrant left breast mass. Biopsy was recommended. EXAM: ULTRASOUND GUIDED CORE NEEDLE BIOPSY OF A LEFT AXILLARY NODE COMPARISON:  Previous exam(s). FINDINGS: I met with the patient and we discussed the procedure of ultrasound-guided biopsy, including benefits and alternatives. We discussed the high likelihood of a successful procedure. We discussed the risks of the procedure, including infection, bleeding, tissue injury, clip migration, and inadequate sampling. Informed written consent was given. The usual time-out protocol was performed immediately prior to the procedure. Using sterile technique and 1% Lidocaine as local anesthetic, under direct ultrasound visualization, a 14 gauge spring-loaded device was used to perform biopsy of a lymph node with cortical thickening in the deep aspect of the left axilla, level 1 using a lateral to medial approach. At the conclusion of the procedure a HydroMARK tissue marker clip was deployed into the biopsy cavity. Follow up 2 view mammogram was performed and dictated separately. IMPRESSION: Ultrasound guided biopsy of left axillary lymph node. No apparent complications. Electronically Signed: By: Curlene Dolphin M.D. On: 11/07/2016 13:44   ASSESSMENT & PLAN:  Wendy [name] is a 76 y.o. female with:  1. Malignant neoplasm of upper-outer quadrant of left breast, Invasive Ductal Carcinoma, cT2N0M0, stage IIB, ER+/PR-/HER2-, G3 --We discussed her imaging findings and the biopsy results in great details. -Giving the early stage disease, she is likely a candidate for breast conservation surgery. She was seen by Dr. Brantley Stage, who offers lumpectomy  and sentinel lymph node biopsy.   -I recommend a Oncotype Dx test on the surgical sample and we'll make a decision about adjuvant chemotherapy based on the Oncotype result. Written material of this test was given to  her. She is 75, but has good PS, would be a good candidate for chemotherapy if her Oncotype recurrence score is high. -I suspect she may have intermediate to high-risk disease, giving the grade 3, weakly ER positive, PR negative disease. -If her surgical sentinel lymph node node positive, I recommend mammaprint for further risk stratification and guide adjuvant chemotherapy. -Giving the ER expression in her tumor and postmenopausal status, I recommend adjuvant endocrine therapy with aromatase inhibitor for a total of 5-10 years to reduce the risk of cancer recurrence. Potential benefits and side effects were discussed with patient and she is interested. -She was also seen by radiation oncologist Dr. Lisbeth Renshaw.  -We also discussed the breast cancer surveillance after her surgery. She will continue annual screening mammogram, self exam, and a routine office visit with lab and exam with [name]. -I encouraged her to have healthy diet and exercise regularly.  -She has surgery scheduled for March 8.   2. HTN and DM -Not well controlled with medication -Follow with PCP -We discussed staying active and eating healthy.    Plan: -She will proceed with lumpectomy and sentinel lymph node biopsy on March 8 by Dr. Brantley Stage -I recommend Oncotype DX test if sentinel lymph node negative, or mammaprint if node positive. -I plan to see her back after she completes adjuvant breast irradiation, or sooner if her Oncotype returns as high risk disease.   All questions were answered. The patient knows to call the clinic with any problems, questions or concerns.  I spent 60 minutes counseling the patient face to face. The total time spent in the appointment was 60 minutes and more than 50% was on counseling.  This document serves as a record of services personally performed by [name], MD. It was created on her behalf by Martinique Casey, a trained medical scribe. The creation of this record is based on the scribe's personal  observations and the provider's statements to them. This document has been checked and approved by the attending provider.  I have reviewed the above documentation for accuracy and completeness and I agree with the above.   [name], MD 11/23/2016

## 2016-11-23 ENCOUNTER — Encounter: Payer: Self-pay | Admitting: Hematology

## 2016-11-23 ENCOUNTER — Ambulatory Visit (HOSPITAL_BASED_OUTPATIENT_CLINIC_OR_DEPARTMENT_OTHER): Payer: Medicare Other | Admitting: Hematology

## 2016-11-23 VITALS — BP 152/57 | HR 71 | Temp 97.9°F | Resp 18 | Ht 62.0 in | Wt 179.0 lb

## 2016-11-23 DIAGNOSIS — Z170 Estrogen receptor positive status [ER+]: Secondary | ICD-10-CM

## 2016-11-23 DIAGNOSIS — E119 Type 2 diabetes mellitus without complications: Secondary | ICD-10-CM | POA: Diagnosis not present

## 2016-11-23 DIAGNOSIS — C50412 Malignant neoplasm of upper-outer quadrant of left female breast: Secondary | ICD-10-CM | POA: Diagnosis present

## 2016-11-23 DIAGNOSIS — I10 Essential (primary) hypertension: Secondary | ICD-10-CM

## 2016-11-24 ENCOUNTER — Telehealth: Payer: Self-pay | Admitting: *Deleted

## 2016-11-24 NOTE — Telephone Encounter (Signed)
Left vm in regards to assess needs and give navigation resources. Contact information provided.

## 2016-11-27 ENCOUNTER — Telehealth: Payer: Self-pay | Admitting: Internal Medicine

## 2016-11-27 ENCOUNTER — Other Ambulatory Visit: Payer: Self-pay

## 2016-11-27 MED ORDER — BASAGLAR KWIKPEN 100 UNIT/ML ~~LOC~~ SOPN: PEN_INJECTOR | SUBCUTANEOUS | 3 refills | Status: DC

## 2016-11-27 NOTE — Telephone Encounter (Signed)
Let's send Basaglar same dose.

## 2016-11-27 NOTE — Telephone Encounter (Signed)
Submitted basaglar pen to pharmacy.

## 2016-11-27 NOTE — Telephone Encounter (Signed)
lantus is too expensive thru [facility] it went to $143, can we call in for a new rx for a cheaper alternate levemir vials or the basaglar pen which is the cheapest  [facility]

## 2016-11-30 DIAGNOSIS — C50919 Malignant neoplasm of unspecified site of unspecified female breast: Secondary | ICD-10-CM

## 2016-11-30 HISTORY — DX: Malignant neoplasm of unspecified site of unspecified female breast: C50.919

## 2016-12-01 ENCOUNTER — Encounter: Payer: Self-pay | Admitting: *Deleted

## 2016-12-01 ENCOUNTER — Encounter (HOSPITAL_BASED_OUTPATIENT_CLINIC_OR_DEPARTMENT_OTHER): Payer: Self-pay | Admitting: *Deleted

## 2016-12-01 NOTE — Progress Notes (Signed)
[place] Psychosocial Distress Screening Clinical Social Work  Clinical Social Work was referred by distress screening protocol.  The patient scored a 5 on the Psychosocial Distress Thermometer which indicates moderate distress. Clinical Social Worker reviewed chart and phoned pt to assess for distress and other psychosocial needs. CSW left brief message explaining role of CSW/Support Team with contact information. CSW awaits return call.    ONCBCN DISTRESS SCREENING 11/20/2016  Screening Type Initial Screening  Distress experienced in past week (1-10) 5  Emotional problem type Nervousness/Anxiety  Physician notified of physical symptoms Yes  Referral to clinical social work Yes    Clinical Social Worker follow up needed: Yes.    If yes, follow up plan: See above  Loren Racer, LCSW, OSW-C Clinical Social Worker [place]  [facility] Phone: [phone] Fax: [phone]

## 2016-12-01 NOTE — Progress Notes (Signed)
[place] Work  Clinical Social Work received return call from pt.  Clinical Social Worker spoke briefly over the phone with pt to offer support and assess for needs.  CSW introduced self and explained role of CSW/Support Team. Pt reports she feels her anxiety has improved now that she has a plan for caregivers for her husband who has dementia around the time of her surgery. Pt denied other needs currently. CSW educated pt about options for support/programs available. Pt plans to reach out as needed.     Clinical Social Work interventions: Resource education   Loren Racer, [place], OSW-C Clinical Social Worker [place]  [place] Phone: [phone] Fax: [phone]

## 2016-12-01 NOTE — Pre-Procedure Instructions (Signed)
To come for BMET, EKG and to pick up 8 oz. water for ERAS

## 2016-12-04 ENCOUNTER — Other Ambulatory Visit: Payer: Self-pay | Admitting: Internal Medicine

## 2016-12-04 ENCOUNTER — Encounter (HOSPITAL_BASED_OUTPATIENT_CLINIC_OR_DEPARTMENT_OTHER)
Admission: RE | Admit: 2016-12-04 | Discharge: 2016-12-04 | Disposition: A | Discharge status: Home / Self Care | Payer: Medicare Other | Source: Ambulatory Visit | Attending: Surgery | Admitting: Surgery

## 2016-12-04 DIAGNOSIS — Z760 Encounter for issue of repeat prescription: Secondary | ICD-10-CM | POA: Diagnosis not present

## 2016-12-04 LAB — BASIC METABOLIC PANEL
Anion gap: 5 (ref 5–15)
BUN: 18 mg/dL (ref 6–20)
CHLORIDE: 105 mmol/L (ref 101–111)
CO2: 29 mmol/L (ref 22–32)
Calcium: 9.6 mg/dL (ref 8.9–10.3)
Creatinine, Ser: 1.18 mg/dL — ABNORMAL HIGH (ref 0.44–1.00)
GFR calc Af Amer: 51 mL/min — ABNORMAL LOW (ref >60)
GFR calc non Af Amer: 44 mL/min — ABNORMAL LOW (ref >60)
Glucose, Bld: 183 mg/dL — ABNORMAL HIGH (ref 65–99)
POTASSIUM: 4.1 mmol/L (ref 3.5–5.1)
Sodium: 139 mmol/L (ref 135–145)

## 2016-12-04 NOTE — Progress Notes (Signed)
Bottled water given to patient with instructions to complete by 0445 and to bring medication with her dos, pt verbalized understanding.

## 2016-12-05 ENCOUNTER — Ambulatory Visit
Admission: RE | Admit: 2016-12-05 | Discharge: 2016-12-05 | Disposition: A | Discharge status: Home / Self Care | Payer: Medicare Other | Source: Ambulatory Visit | Attending: Surgery | Admitting: Surgery

## 2016-12-05 DIAGNOSIS — R928 Other abnormal and inconclusive findings on diagnostic imaging of breast: Secondary | ICD-10-CM | POA: Diagnosis not present

## 2016-12-05 DIAGNOSIS — C50412 Malignant neoplasm of upper-outer quadrant of left female breast: Secondary | ICD-10-CM

## 2016-12-05 DIAGNOSIS — Z170 Estrogen receptor positive status [ER+]: Principal | ICD-10-CM

## 2016-12-07 ENCOUNTER — Ambulatory Visit
Admission: RE | Admit: 2016-12-07 | Discharge: 2016-12-07 | Disposition: A | Discharge status: Home / Self Care | Payer: Medicare Other | Source: Ambulatory Visit | Attending: Surgery | Admitting: Surgery

## 2016-12-07 ENCOUNTER — Encounter (HOSPITAL_BASED_OUTPATIENT_CLINIC_OR_DEPARTMENT_OTHER): Payer: Self-pay | Admitting: *Deleted

## 2016-12-07 ENCOUNTER — Encounter (HOSPITAL_BASED_OUTPATIENT_CLINIC_OR_DEPARTMENT_OTHER)
Admission: RE | Disposition: A | Discharge status: Home / Self Care | Payer: Self-pay | Source: Ambulatory Visit | Attending: Surgery

## 2016-12-07 ENCOUNTER — Encounter (HOSPITAL_COMMUNITY)
Admission: RE | Admit: 2016-12-07 | Discharge: 2016-12-07 | Disposition: A | Discharge status: Home / Self Care | Payer: Medicare Other | Source: Ambulatory Visit | Attending: Surgery | Admitting: Surgery

## 2016-12-07 ENCOUNTER — Ambulatory Visit (HOSPITAL_BASED_OUTPATIENT_CLINIC_OR_DEPARTMENT_OTHER): Payer: Medicare Other | Admitting: Anesthesiology

## 2016-12-07 ENCOUNTER — Ambulatory Visit (HOSPITAL_COMMUNITY)
Admission: RE | Admit: 2016-12-07 | Discharge: 2016-12-07 | Disposition: A | Discharge status: Home / Self Care | Payer: Medicare Other | Source: Ambulatory Visit | Attending: Surgery | Admitting: Surgery

## 2016-12-07 DIAGNOSIS — I509 Heart failure, unspecified: Secondary | ICD-10-CM | POA: Diagnosis not present

## 2016-12-07 DIAGNOSIS — C773 Secondary and unspecified malignant neoplasm of axilla and upper limb lymph nodes: Secondary | ICD-10-CM | POA: Diagnosis not present

## 2016-12-07 DIAGNOSIS — I110 Hypertensive heart disease with heart failure: Secondary | ICD-10-CM | POA: Diagnosis not present

## 2016-12-07 DIAGNOSIS — C50412 Malignant neoplasm of upper-outer quadrant of left female breast: Secondary | ICD-10-CM | POA: Diagnosis not present

## 2016-12-07 DIAGNOSIS — Z7982 Long term (current) use of aspirin: Secondary | ICD-10-CM | POA: Diagnosis not present

## 2016-12-07 DIAGNOSIS — E119 Type 2 diabetes mellitus without complications: Secondary | ICD-10-CM | POA: Insufficient documentation

## 2016-12-07 DIAGNOSIS — Z79899 Other long term (current) drug therapy: Secondary | ICD-10-CM | POA: Insufficient documentation

## 2016-12-07 DIAGNOSIS — Z170 Estrogen receptor positive status [ER+]: Secondary | ICD-10-CM | POA: Insufficient documentation

## 2016-12-07 DIAGNOSIS — C50912 Malignant neoplasm of unspecified site of left female breast: Secondary | ICD-10-CM | POA: Diagnosis not present

## 2016-12-07 DIAGNOSIS — G8918 Other acute postprocedural pain: Secondary | ICD-10-CM | POA: Diagnosis not present

## 2016-12-07 DIAGNOSIS — E7800 Pure hypercholesterolemia, unspecified: Secondary | ICD-10-CM | POA: Diagnosis not present

## 2016-12-07 DIAGNOSIS — E785 Hyperlipidemia, unspecified: Secondary | ICD-10-CM | POA: Diagnosis not present

## 2016-12-07 DIAGNOSIS — Z794 Long term (current) use of insulin: Secondary | ICD-10-CM | POA: Diagnosis not present

## 2016-12-07 DIAGNOSIS — F17200 Nicotine dependence, unspecified, uncomplicated: Secondary | ICD-10-CM | POA: Diagnosis not present

## 2016-12-07 DIAGNOSIS — C779 Secondary and unspecified malignant neoplasm of lymph node, unspecified: Secondary | ICD-10-CM | POA: Diagnosis not present

## 2016-12-07 HISTORY — DX: Long term (current) use of insulin: Z79.4

## 2016-12-07 HISTORY — DX: Malignant neoplasm of unspecified site of unspecified female breast: C50.919

## 2016-12-07 HISTORY — DX: Hyperlipidemia, unspecified: E78.5

## 2016-12-07 HISTORY — PX: BREAST LUMPECTOMY WITH RADIOACTIVE SEED AND SENTINEL LYMPH NODE BIOPSY: SHX6550

## 2016-12-07 HISTORY — DX: Reserved for inherently not codable concepts without codable children: IMO0001

## 2016-12-07 HISTORY — PX: BREAST LUMPECTOMY: SHX2

## 2016-12-07 HISTORY — DX: Type 2 diabetes mellitus without complications: E11.9

## 2016-12-07 HISTORY — DX: Gastro-esophageal reflux disease without esophagitis: K21.9

## 2016-12-07 LAB — POCT HEMOGLOBIN-HEMACUE: Hemoglobin: 12.8 g/dL (ref 12.0–15.0)

## 2016-12-07 LAB — GLUCOSE, CAPILLARY
Glucose-Capillary: 243 mg/dL — ABNORMAL HIGH (ref 65–99)
Glucose-Capillary: 179 mg/dL — ABNORMAL HIGH (ref 65–99)

## 2016-12-07 SURGERY — BREAST LUMPECTOMY WITH RADIOACTIVE SEED AND SENTINEL LYMPH NODE BIOPSY
Anesthesia: General | Site: Breast | Laterality: Left

## 2016-12-07 MED ORDER — METOCLOPRAMIDE HCL 5 MG/ML IJ SOLN: 10.0000 mg | Freq: Once | INTRAMUSCULAR | Status: DC | PRN

## 2016-12-07 MED ORDER — ONDANSETRON HCL 4 MG/2ML IJ SOLN
INTRAMUSCULAR | Status: AC
  Filled 2016-12-07: qty 2

## 2016-12-07 MED ORDER — ONDANSETRON HCL 4 MG/2ML IJ SOLN
INTRAMUSCULAR | Status: DC | PRN
  Administered 2016-12-07: 4 mg via INTRAVENOUS

## 2016-12-07 MED ORDER — CHLORHEXIDINE GLUCONATE CLOTH 2 % EX PADS: 6.0000 | MEDICATED_PAD | Freq: Once | CUTANEOUS | Status: DC

## 2016-12-07 MED ORDER — CELECOXIB 400 MG PO CAPS
400.0000 mg | ORAL_CAPSULE | ORAL | Status: AC
  Administered 2016-12-07: 400 mg via ORAL

## 2016-12-07 MED ORDER — LIDOCAINE 2% (20 MG/ML) 5 ML SYRINGE
INTRAMUSCULAR | Status: AC
  Filled 2016-12-07: qty 5

## 2016-12-07 MED ORDER — GABAPENTIN 300 MG PO CAPS
300.0000 mg | ORAL_CAPSULE | ORAL | Status: AC
  Administered 2016-12-07: 300 mg via ORAL

## 2016-12-07 MED ORDER — EPHEDRINE SULFATE 50 MG/ML IJ SOLN
INTRAMUSCULAR | Status: DC | PRN
  Administered 2016-12-07 (×3): 10 mg via INTRAVENOUS

## 2016-12-07 MED ORDER — SODIUM CHLORIDE 0.9 % IJ SOLN
INTRAVENOUS | Status: DC | PRN
  Administered 2016-12-07: 5 mL via INTRAMUSCULAR

## 2016-12-07 MED ORDER — FENTANYL CITRATE (PF) 100 MCG/2ML IJ SOLN
INTRAMUSCULAR | Status: AC
  Filled 2016-12-07: qty 2

## 2016-12-07 MED ORDER — EPHEDRINE 5 MG/ML INJ
INTRAVENOUS | Status: AC
  Filled 2016-12-07: qty 10

## 2016-12-07 MED ORDER — TECHNETIUM TC 99M SULFUR COLLOID FILTERED
1.0000 | Freq: Once | INTRAVENOUS | Status: AC | PRN
  Administered 2016-12-07: 1 via INTRADERMAL

## 2016-12-07 MED ORDER — INSULIN ASPART 100 UNIT/ML ~~LOC~~ SOLN
5.0000 [IU] | Freq: Once | SUBCUTANEOUS | Status: AC
  Administered 2016-12-07: 5 [IU] via SUBCUTANEOUS

## 2016-12-07 MED ORDER — HYDROCODONE-ACETAMINOPHEN 7.5-325 MG PO TABS: 1.0000 | ORAL_TABLET | Freq: Once | ORAL | Status: DC | PRN

## 2016-12-07 MED ORDER — ACETAMINOPHEN 500 MG PO TABS
ORAL_TABLET | ORAL | Status: AC
  Filled 2016-12-07: qty 2

## 2016-12-07 MED ORDER — CELECOXIB 200 MG PO CAPS
ORAL_CAPSULE | ORAL | Status: AC
  Filled 2016-12-07: qty 2

## 2016-12-07 MED ORDER — FENTANYL CITRATE (PF) 100 MCG/2ML IJ SOLN
50.0000 ug | INTRAMUSCULAR | Status: DC | PRN
  Administered 2016-12-07: 50 ug via INTRAVENOUS

## 2016-12-07 MED ORDER — BUPIVACAINE-EPINEPHRINE (PF) 0.5% -1:200000 IJ SOLN
INTRAMUSCULAR | Status: DC | PRN
  Administered 2016-12-07: 10 mL

## 2016-12-07 MED ORDER — FENTANYL CITRATE (PF) 100 MCG/2ML IJ SOLN
25.0000 ug | INTRAMUSCULAR | Status: DC | PRN
  Administered 2016-12-07 (×2): 50 ug via INTRAVENOUS

## 2016-12-07 MED ORDER — MIDAZOLAM HCL 2 MG/2ML IJ SOLN
INTRAMUSCULAR | Status: AC
  Filled 2016-12-07: qty 2

## 2016-12-07 MED ORDER — SCOPOLAMINE 1 MG/3DAYS TD PT72: 1.0000 | MEDICATED_PATCH | Freq: Once | TRANSDERMAL | Status: DC | PRN

## 2016-12-07 MED ORDER — GABAPENTIN 300 MG PO CAPS
ORAL_CAPSULE | ORAL | Status: AC
  Filled 2016-12-07: qty 1

## 2016-12-07 MED ORDER — LACTATED RINGERS IV SOLN
INTRAVENOUS | Status: DC
  Administered 2016-12-07: 11:00:00 via INTRAVENOUS
  Administered 2016-12-07: 10 mL/h via INTRAVENOUS
  Administered 2016-12-07: 08:00:00 via INTRAVENOUS

## 2016-12-07 MED ORDER — OXYCODONE-ACETAMINOPHEN 5-325 MG PO TABS: 1.0000 | ORAL_TABLET | ORAL | 0 refills | Status: DC | PRN

## 2016-12-07 MED ORDER — DEXTROSE 5 % IV SOLN
3.0000 g | INTRAVENOUS | Status: AC
  Administered 2016-12-07: 2 g via INTRAVENOUS

## 2016-12-07 MED ORDER — ACETAMINOPHEN 500 MG PO TABS
1000.0000 mg | ORAL_TABLET | ORAL | Status: AC
  Administered 2016-12-07: 1000 mg via ORAL

## 2016-12-07 MED ORDER — CEFAZOLIN SODIUM-DEXTROSE 2-4 GM/100ML-% IV SOLN
INTRAVENOUS | Status: AC
  Filled 2016-12-07: qty 100

## 2016-12-07 MED ORDER — MEPERIDINE HCL 25 MG/ML IJ SOLN: 6.2500 mg | INTRAMUSCULAR | Status: DC | PRN

## 2016-12-07 MED ORDER — MIDAZOLAM HCL 2 MG/2ML IJ SOLN
1.0000 mg | INTRAMUSCULAR | Status: DC | PRN
  Administered 2016-12-07: 1 mg via INTRAVENOUS

## 2016-12-07 MED ORDER — LIDOCAINE 2% (20 MG/ML) 5 ML SYRINGE
INTRAMUSCULAR | Status: DC | PRN
  Administered 2016-12-07: 100 mg via INTRAVENOUS

## 2016-12-07 MED ORDER — FENTANYL CITRATE (PF) 100 MCG/2ML IJ SOLN
INTRAMUSCULAR | Status: AC
Start: 2016-12-07 — End: 2016-12-07
  Filled 2016-12-07: qty 2

## 2016-12-07 MED ORDER — PROPOFOL 10 MG/ML IV BOLUS
INTRAVENOUS | Status: DC | PRN
  Administered 2016-12-07: 150 mg via INTRAVENOUS
  Administered 2016-12-07: 50 mg via INTRAVENOUS

## 2016-12-07 MED ORDER — INSULIN ASPART 100 UNIT/ML ~~LOC~~ SOLN
SUBCUTANEOUS | Status: AC
  Filled 2016-12-07: qty 1

## 2016-12-07 SURGICAL SUPPLY — 49 items
APPLIER CLIP 9.375 MED OPEN (MISCELLANEOUS) ×3
BINDER BREAST XLRG (GAUZE/BANDAGES/DRESSINGS) ×3 IMPLANT
BLADE SURG 15 STRL LF DISP TIS (BLADE) ×1 IMPLANT
BLADE SURG 15 STRL SS (BLADE) ×2
CANISTER SUCT 1200ML W/VALVE (MISCELLANEOUS) ×3 IMPLANT
CHLORAPREP W/TINT 26ML (MISCELLANEOUS) ×3 IMPLANT
CLIP APPLIE 9.375 MED OPEN (MISCELLANEOUS) ×1 IMPLANT
COVER BACK TABLE 60X90IN (DRAPES) ×3 IMPLANT
COVER MAYO STAND STRL (DRAPES) ×3 IMPLANT
COVER PROBE W GEL 5X96 (DRAPES) ×3 IMPLANT
DERMABOND ADVANCED (GAUZE/BANDAGES/DRESSINGS) ×2
DERMABOND ADVANCED .7 DNX12 (GAUZE/BANDAGES/DRESSINGS) ×1 IMPLANT
DEVICE DUBIN W/COMP PLATE 8390 (MISCELLANEOUS) ×3 IMPLANT
DRAIN CHANNEL 19F RND (DRAIN) ×3 IMPLANT
DRAPE LAPAROSCOPIC ABDOMINAL (DRAPES) ×3 IMPLANT
DRAPE UTILITY XL STRL (DRAPES) ×3 IMPLANT
ELECT COATED BLADE 2.86 ST (ELECTRODE) ×3 IMPLANT
ELECT REM PT RETURN 9FT ADLT (ELECTROSURGICAL) ×3
ELECTRODE REM PT RTRN 9FT ADLT (ELECTROSURGICAL) ×1 IMPLANT
EVACUATOR SILICONE 100CC (DRAIN) ×3 IMPLANT
GLOVE BIO SURGEON STRL SZ 6.5 (GLOVE) ×2 IMPLANT
GLOVE BIO SURGEONS STRL SZ 6.5 (GLOVE) ×1
GLOVE BIOGEL PI IND STRL 7.0 (GLOVE) ×1 IMPLANT
GLOVE BIOGEL PI IND STRL 8 (GLOVE) ×1 IMPLANT
GLOVE BIOGEL PI INDICATOR 7.0 (GLOVE) ×2
GLOVE BIOGEL PI INDICATOR 8 (GLOVE) ×2
GLOVE ECLIPSE 8.0 STRL XLNG CF (GLOVE) ×3 IMPLANT
GOWN STRL REUS W/ TWL LRG LVL3 (GOWN DISPOSABLE) ×2 IMPLANT
GOWN STRL REUS W/TWL LRG LVL3 (GOWN DISPOSABLE) ×4
HEMOSTAT SNOW SURGICEL 2X4 (HEMOSTASIS) ×3 IMPLANT
KIT MARKER MARGIN INK (KITS) ×3 IMPLANT
NDL SAFETY ECLIPSE 18X1.5 (NEEDLE) ×1 IMPLANT
NEEDLE HYPO 18GX1.5 SHARP (NEEDLE) ×2
NEEDLE HYPO 25X1 1.5 SAFETY (NEEDLE) ×6 IMPLANT
NS IRRIG 1000ML POUR BTL (IV SOLUTION) ×3 IMPLANT
PACK BASIN DAY SURGERY FS (CUSTOM PROCEDURE TRAY) ×3 IMPLANT
PENCIL BUTTON HOLSTER BLD 10FT (ELECTRODE) ×3 IMPLANT
PIN SAFETY STERILE (MISCELLANEOUS) ×3 IMPLANT
SLEEVE SCD COMPRESS KNEE MED (MISCELLANEOUS) ×3 IMPLANT
SPONGE LAP 4X18 X RAY DECT (DISPOSABLE) ×3 IMPLANT
SUT ETHILON 2 0 FS 18 (SUTURE) ×3 IMPLANT
SUT MNCRL AB 4-0 PS2 18 (SUTURE) ×3 IMPLANT
SUT VICRYL 3-0 CR8 SH (SUTURE) ×3 IMPLANT
SYR CONTROL 10ML LL (SYRINGE) ×6 IMPLANT
TOWEL OR 17X24 6PK STRL BLUE (TOWEL DISPOSABLE) ×3 IMPLANT
TOWEL OR NON WOVEN STRL DISP B (DISPOSABLE) ×3 IMPLANT
TUBE CONNECTING 20'X1/4 (TUBING) ×1
TUBE CONNECTING 20X1/4 (TUBING) ×2 IMPLANT
YANKAUER SUCT BULB TIP NO VENT (SUCTIONS) ×3 IMPLANT

## 2016-12-07 NOTE — Interval H&P Note (Signed)
History and Physical Interval Note:  12/07/2016 8:47 AM  Wendy [name]  has presented today for surgery, with the diagnosis of LEFT BREAST CANCER  The various methods of treatment have been discussed with the patient and family. After consideration of risks, benefits and other options for treatment, the patient has consented to  Procedure(s): LEFT BREAST LUMPECTOMY WITH RADIOACTIVE SEED AND SENTINEL LYMPH NODE BIOPSY (Left) as a surgical intervention .  The patient's history has been reviewed, patient examined, no change in status, stable for surgery.  I have reviewed the patient's chart and labs.  Questions were answered to the patient's satisfaction.     Dax Murguia A.

## 2016-12-07 NOTE — Anesthesia Postprocedure Evaluation (Signed)
Anesthesia Post Note  Patient: Wendy [name]  Procedure(s) Performed: Procedure(s) (LRB): LEFT BREAST LUMPECTOMY WITH RADIOACTIVE SEED AND SENTINEL LYMPH NODE BIOPSY (Left)  Patient location during evaluation: PACU Anesthesia Type: General Level of consciousness: awake and alert Pain management: pain level controlled Vital Signs Assessment: post-procedure vital signs reviewed and stable Respiratory status: spontaneous breathing, nonlabored ventilation and respiratory function stable Cardiovascular status: blood pressure returned to baseline and stable Postop Assessment: no signs of nausea or vomiting Anesthetic complications: no       Last Vitals:  Vitals:   12/07/16 1100 12/07/16 1145  BP: 135/69 (!) 170/70  Pulse: (!) 56 73  Resp: (!) 28 18  Temp:  36.3 C    Last Pain:  Vitals:   12/07/16 1145  TempSrc:   PainSc: 3                  Kharizma Lesnick A.

## 2016-12-07 NOTE — Op Note (Signed)
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   Anesthesia: LMA with pectoral block anesthesia  And local   EBL: 20 cc   Specimen: Left breast mass with clip and seed to pathology and one axillary sentinel node hot and blue    Drains: 59 F   Indications for procedure: Patient presents for treatment of her left breast cancer. She has opted for breast conservation after lengthy discussion of treatment options to include breast conservation surgery and mastectomy and reconstruction. Risks, benefits and alternatives discussed with the patient.The procedure has been discussed with the patient. Alternatives to surgery have been discussed with the patient. Risks of surgery include bleeding, Infection, Seroma formation, death, and the need for further surgery. The patient understands and wishes to proceed.Sentinel lymph node mapping and dissection has been discussed with the patient. Risk of bleeding, Infection, Seroma formation, Additional procedures,, Shoulder weakness , Shoulder stiffness, Nerve and blood vessel injury and reaction to the mapping dyes have been discussed. Alternatives to surgery have been discussed with the patient. The patient agrees to proceed.   Description of procedure: Patient underwent placement of left breast Seed at radiology earlier in the week. She presents to the holding area and questions are answered. Neoprobe was used to verify clip placement in the left breast. Patient underwent technetium sulfur colloid injection per protocol. Questions answered. Patient taken back to operating room and placed supine on the operating room table. Patient received 2 g of Ancef. After induction of LMA anesthesia left breast was prepped and draped in a sterile fashion and 4 cc  of methylene blue dye were injected in a subareolar position. Of note, patient had pectoral block by anesthesia prior to this. Neoprobe was used to identify the radioactive seen in the left upper-outer quadrant. Curvilinear incision made and dissection was carried around to excise all tissue around both the clip and seen. Radiograph showed the mass with gross negative margins. Both she and clip were in the specimen. Specimen sent to pathology.  Neoprobe was switched to the technetium sulfur colloid setting. Hot spot identify the left axilla. I used the lumpectomy incision to access the axilla.. One Hot and blue lymph node identified and excised. Background counts approached 0. Wound was irrigated.  Hemostasis achieved.  A 19 F drain  was placed through a separate stab incision.  The incision  and closed with 3-0 Vicryl and 4-0 Monocryl. Lumpectomy site closed in a similar fashion. Dermabond applied. All final counts sponge, needle instruments found to be correct at this point. Patient awoke, taken to recovery in satisfactory condition.

## 2016-12-07 NOTE — H&P (Signed)
The  narcotic database has been quieried and no conflicts identified.

## 2016-12-07 NOTE — Anesthesia Preprocedure Evaluation (Signed)
Anesthesia Evaluation  Patient identified by MRN, date of birth, ID band Patient awake    Reviewed: Allergy & Precautions, NPO status , Patient's Chart, lab work & pertinent test results, reviewed documented beta blocker date and time   Airway Mallampati: III  TM Distance: >3 FB Neck ROM: Full    Dental no notable dental hx. (+) Teeth Intact   Pulmonary neg pulmonary ROS,    Pulmonary exam normal breath sounds clear to auscultation       Cardiovascular hypertension, Pt. on medications and Pt. on home beta blockers +CHF  Normal cardiovascular exam Rhythm:Regular Rate:Normal     Neuro/Psych PSYCHIATRIC DISORDERS negative neurological ROS     GI/Hepatic GERD  Medicated and Controlled,  Endo/Other  diabetes, Poorly Controlled, Type 2, Oral Hypoglycemic Agents, Insulin DependentLeft Breast Ca  Renal/GU Renal InsufficiencyRenal disease  negative genitourinary   Musculoskeletal negative musculoskeletal ROS (+)   Abdominal (+) + obese,   Peds  Hematology   Anesthesia Other Findings   Reproductive/Obstetrics                             Anesthesia Physical Anesthesia Plan  ASA: III  Anesthesia Plan: General and Regional   Post-op Pain Management:  Regional for Post-op pain   Induction: Intravenous  Airway Management Planned: LMA  Additional Equipment:   Intra-op Plan:   Post-operative Plan: Extubation in OR  Informed Consent: I have reviewed the patients History and Physical, chart, labs and discussed the procedure including the risks, benefits and alternatives for the proposed anesthesia with the patient or authorized representative who has indicated his/her understanding and acceptance.   Dental advisory given  Plan Discussed with: Anesthesiologist, CRNA and Surgeon  Anesthesia Plan Comments:         Anesthesia Quick Evaluation

## 2016-12-07 NOTE — Addendum Note (Signed)
Addendum  created 12/07/16 1403 by Josephine Igo, MD   SmartForm saved

## 2016-12-07 NOTE — Anesthesia Procedure Notes (Signed)
Anesthesia Regional Block: Pectoralis block   Pre-Anesthetic Checklist: ,, timeout performed, Correct Patient, Correct Site, Correct Laterality, Correct Procedure, Correct Position, site marked, Risks and benefits discussed,  Surgical consent,  Pre-op evaluation,  At surgeon's request and post-op pain management  Laterality: Left  Prep: chloraprep       Needles:  Injection technique: Single-shot  Needle Type: Echogenic Needle     Needle Length: 9cm  Needle Gauge: 21     Additional Needles:   Procedures: ultrasound guided,,,,,,,,  Narrative:  Start time: 12/07/2016 7:50 AM End time: 12/07/2016 8:00 AM Injection made incrementally with aspirations every 5 mL.  Performed by: Personally   Additional Notes: 20 cc 0.75% Ropivacaine injected easily

## 2016-12-07 NOTE — Progress Notes (Signed)
Assisted Dr. Joslin with left, ultrasound guided, pectoralis block. Side rails up, monitors on throughout procedure. See vital signs in flow sheet. Tolerated Procedure well. 

## 2016-12-07 NOTE — Anesthesia Procedure Notes (Signed)
Procedure Name: LMA Insertion Date/Time: 12/07/2016 9:01 AM Performed by: Lieutenant Diego Pre-anesthesia Checklist: Patient identified, Emergency Drugs available, Suction available and Patient being monitored Patient Re-evaluated:Patient Re-evaluated prior to inductionOxygen Delivery Method: Circle system utilized Preoxygenation: Pre-oxygenation with 100% oxygen Intubation Type: IV induction Ventilation: Mask ventilation without difficulty LMA: LMA inserted LMA Size: 4.0 Number of attempts: 1 Airway Equipment and Method: Bite block Placement Confirmation: positive ETCO2 and breath sounds checked- equal and bilateral Tube secured with: Tape Dental Injury: Teeth and Oropharynx as per pre-operative assessment

## 2016-12-07 NOTE — H&P (View-Only) (Signed)
Wendy [name] 11/13/2016 2:13 PM Location: [facility] Patient #: [id number] DOB: 22-Mar-1941 Married / Language: English / Race: Black or African American Female  History of Present Illness Marcello Moores A. Doug Bucklin MD; 11/13/2016 2:38 PM) Patient words: Patient sent at the request of Dr. Celso Amy for left breast mass. The patient noted a left breast mass upper outer quadrant about 2 months ago. He was mildly tender. She underwent mammogram and ultrasound which showed a 3.1 cm mass left breast upper outer quadrant with pleomorphic calcifications. She also had some abnormal-appearing lymph nodes in the left axilla. The right breast was read as normal. 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 HER-2/neu pending. She denies any nipple discharge. She is sore from her biopsy she states.         CLINICAL DATA: 76 year old female complaining of a palpable mass in the left breast.  EXAM: 2D DIGITAL DIAGNOSTIC LEFT MAMMOGRAM WITH CAD AND ADJUNCT TOMO  ULTRASOUND LEFT BREAST  COMPARISON: Previous exam(s).  ACR Breast Density Category b: There are scattered areas of fibroglandular density.  FINDINGS: In the upper-outer quadrant of the anterior third of the left breast is a developing 3.1 x 2.1 x 1.4 cm mass associated with pleomorphic calcifications. There is a 8 mm prominent lymph node seen in the left axilla.  Mammographic images were processed with CAD.  On physical exam, I palpate a discrete mass in the left breast at 1 o'clock 2 cm from the nipple.  Targeted ultrasound is performed, showing an irregular hypoechoic mass in the left breast at 1 o'clock 2 cm from the nipple measuring 3.3 x 2.0 x 2.7 cm. Sonographic evaluation the left axilla shows a 1.8 cm lymph node with a focally thickened cortex measuring measuring 5 mm.  IMPRESSION: The mass in the 1 o'clock region of the left breast and abnormal axillary lymph  node are suspicious for invasive mammary carcinoma with axillary metastasis.  RECOMMENDATION: Ultrasound-guided core biopsies of the left breast mass as well as the abnormal left axillary lymph node is recommended. The biopsies will be scheduled at the patient's convenience.  I have discussed the findings and recommendations with the patient. Results were also provided in writing at the conclusion of the visit. If applicable, a reminder letter will be sent to the patient regarding the next appointment.  BI-RADS CATEGORY 5: Highly suggestive of malignancy.   Electronically Signed By: Lillia Mountain M.D. On: 11/01/2016 13:35            ADDITIONAL INFORMATION: 1. PROGNOSTIC INDICATORS Results: IMMUNOHISTOCHEMICAL AND MORPHOMETRIC ANALYSIS PERFORMED MANUALLY Estrogen Receptor: 80%, POSITIVE, WEAK STAINING INTENSITY Progesterone Receptor: 0%, NEGATIVE Proliferation Marker Ki67: 90% COMMENT: The negative hormone receptor study(ies) in this case has An internal positive control. REFERENCE RANGE ESTROGEN RECEPTOR NEGATIVE 0% POSITIVE =>1% REFERENCE RANGE PROGESTERONE RECEPTOR NEGATIVE 0% POSITIVE =>1% All controls stained appropriately Enid Cutter MD Pathologist, Electronic Signature ( Signed 11/09/2016) FINAL DIAGNOSIS 1 of 3 FINAL for Wendy [name], Wendy [name] [phone]) Diagnosis 1. Breast, left, needle core biopsy, 1:00 o'clock upper outer quadrant - INVASIVE DUCTAL CARCINOMA. - DUCTAL CARCINOMA IN SITU - SEE COMMENT. 2. Lymph node, needle/core biopsy, inferior left axilla - ONE PARTIALLY SAMPLED BENIGN LYMPH NODE WITH NO TUMOR SEEN. Microscopic Comment 1. Although definitive grading of breast carcinoma is best.  The patient is a 76 year old female.   Past Surgical History Malachy Moan, [place]; 11/13/2016 2:13 PM) Breast Biopsy Left.  Diagnostic Studies History Malachy Moan,  RMA; 11/13/2016 2:13 PM) Colonoscopy 1-5 years ago Pap Smear 1-5 years  ago  Allergies Malachy Moan, RMA; 11/13/2016 2:14 PM) No Known Allergies 11/13/2016  Medication History Malachy Moan, RMA; 11/13/2016 2:14 PM) AmLODIPine Besylate ('10MG'$  Tablet, Oral) Active. Basaglar KwikPen (100UNIT/ML Soln Pen-inj, Subcutaneous) Active. BD Insulin Syringe (30G X 1/2"0.5 ML Misc,) Active. BD Insulin Syr Ultrafine II (31G X 5/16"0.5 ML Misc,) Active. Carvedilol ('25MG'$  Tablet, Oral) Active. CloNIDine HCl (0.'3MG'$  Tablet, Oral) Active. HydroCHLOROthiazide ('25MG'$  Tablet, Oral) Active. Lantus (100UNIT/ML Solution, Subcutaneous) Active. Lisinopril ('40MG'$  Tablet, Oral) Active. MetFORMIN HCl ('500MG'$  Tablet, Oral) Active. NovoLOG (100UNIT/ML Solution, Subcutaneous) Active. Rosuvastatin Calcium ('5MG'$  Tablet, Oral) Active. Medications Reconciled  Social History Malachy Moan, [place]; 11/13/2016 2:13 PM) Caffeine use Carbonated beverages, Coffee. No alcohol use No drug use Tobacco use Never smoker.  Family History Malachy Moan, [place]; 11/13/2016 2:13 PM) Arthritis Sister. Diabetes Mellitus Brother, Sister. Heart Disease Brother. Hypertension Brother, Sister.  Pregnancy / Birth History Malachy Moan, [place]; 11/13/2016 2:13 PM) Age at menarche 49 years. Age of menopause 39-50 Gravida 28 Maternal age 55-25 Para 73  Other Problems Malachy Moan, [place]; 11/13/2016 2:13 PM) Breast Cancer Diabetes Mellitus Gastroesophageal Reflux Disease High blood pressure Hypercholesterolemia     Review of Systems Malachy Moan RMA; 11/13/2016 2:13 PM) General Present- Weight Loss. Not Present- Appetite Loss, Chills, Fatigue, Fever, Night Sweats and Weight Gain. Skin Not Present- Change in Wart/Mole, Dryness, Hives, Jaundice, New Lesions, Non-Healing Wounds, Rash and Ulcer. HEENT Not Present- Earache, Hearing Loss, Hoarseness, Nose Bleed, Oral Ulcers, Ringing in the Ears, Seasonal Allergies, Sinus Pain, Sore Throat, Visual Disturbances, Wears  glasses/contact lenses and Yellow Eyes. Breast Present- Breast Mass and Breast Pain. Not Present- Nipple Discharge and Skin Changes. Cardiovascular Present- Leg Cramps. Not Present- Chest Pain, Difficulty Breathing Lying Down, Palpitations, Rapid Heart Rate, Shortness of Breath and Swelling of Extremities. Gastrointestinal Present- Constipation. Not Present- Abdominal Pain, Bloating, Bloody Stool, Change in Bowel Habits, Chronic diarrhea, Difficulty Swallowing, Excessive gas, Gets full quickly at meals, Hemorrhoids, Indigestion, Nausea, Rectal Pain and Vomiting. Female Genitourinary Present- Urgency. Not Present- Frequency, Nocturia, Painful Urination and Pelvic Pain. Musculoskeletal Present- Muscle Pain and Muscle Weakness. Not Present- Back Pain, Joint Pain, Joint Stiffness and Swelling of Extremities. Neurological Not Present- Decreased Memory, Fainting, Headaches, Numbness, Seizures, Tingling, Tremor, Trouble walking and Weakness. Psychiatric Not Present- Anxiety, Bipolar, Change in Sleep Pattern, Depression, Fearful and Frequent crying. Endocrine Not Present- Cold Intolerance, Excessive Hunger, Hair Changes, Heat Intolerance, Hot flashes and New Diabetes. Hematology Not Present- Blood Thinners, Easy Bruising, Excessive bleeding, Gland problems, HIV and Persistent Infections.  Vitals Malachy Moan RMA; 11/13/2016 2:15 PM) 11/13/2016 2:14 PM Weight: 182 lb Height: 62in Body Surface Area: 1.84 m Body Mass Index: 33.29 kg/m  Temp.: 98.10F  Pulse: 72 (Regular)  BP: 150/70 (Sitting, Left Arm, Standard)      Physical Exam (Melesio Madara A. Taytum Wheller MD; 11/13/2016 2:39 PM)  General Mental Status-Alert. General Appearance-Consistent with stated age. Hydration-Well hydrated. Voice-Normal.  Head and Neck Head-normocephalic, atraumatic with no lesions or palpable masses. Trachea-midline. Thyroid Gland Characteristics - normal size and consistency.  Chest and Lung  Exam Chest and lung exam reveals -quiet, even and easy respiratory effort with no use of accessory muscles and on auscultation, normal breath sounds, no adventitious sounds and normal vocal resonance. Inspection Chest Wall - Normal. Back - normal.  Breast Note: Left breast upper outer quadrant is a mobile 3 cm mass. No nipple discharge. Mild left axillary adenopathy noted  Cardiovascular Cardiovascular examination reveals -normal  heart sounds, regular rate and rhythm with no murmurs and normal pedal pulses bilaterally.  Neurologic Neurologic evaluation reveals -alert and oriented x 3 with no impairment of recent or remote memory. Mental Status-Normal.  Lymphatic Head & Neck  General Head & Neck Lymphatics: Bilateral - Description - Normal. Axillary -Note:Mild left axillary adenopathy noted. Right axilla is normal.     Assessment & Plan (Paislei Dorval A. Vedanshi Massaro MD; 11/13/2016 2:40 PM)  BREAST CANCER, LEFT (C50.912) Impression: Stage II  Discussed breast conservation versus mastectomy with reconstruction. It's large enough tumor that she could benefit from neoadjuvant chemotherapy but certainly given her breast size location of tumor lumpectomy could Be done up front. Her further medical radiation oncology. She would like to proceed with left breast seed Risk of lumpectomy include bleeding, infection, seroma, more surgery, use of seed/wire, wound care, cosmetic deformity and the need for other treatments, death , blood clots, death. Pt agrees to proceed. Risk of sentinel lymph node mapping include bleeding, infection, lymphedema, shoulder pain. stiffness, dye allergy. cosmetic deformity , blood clots, death, need for more surgery. Pt agres to proceed. localized partial mastectomy with left axillary sentinel lymph node mapping.     Risk of lumpectomy include bleeding, infection, seroma, more surgery, use of seed/wire, wound care, cosmetic deformity and the need for other  treatments, death , blood clots, death. Pt agrees to proceed. Risk of sentinel lymph node mapping include bleeding, infection, lymphedema, shoulder pain. stiffness, dye allergy. cosmetic deformity , blood clots, death, need for more surgery. Pt agres to proceed.  Current Plans You are being scheduled for surgery- Our schedulers will call you.  You should hear from our office's scheduling department within 5 working days about the location, date, and time of surgery. We try to make accommodations for patient's preferences in scheduling surgery, but sometimes the OR schedule or the surgeon's schedule prevents [name] from making those accommodations.  If you have not heard from our office [phone]) in 5 working days, call the office and ask for your surgeon's nurse.  If you have other questions about your diagnosis, plan, or surgery, call the office and ask for your surgeon's nurse.  We discussed the staging and pathophysiology of breast cancer. We discussed all of the different options for treatment for breast cancer including surgery, chemotherapy, radiation therapy, Herceptin, and antiestrogen therapy. We discussed a sentinel lymph node biopsy as she does not appear to having lymph node involvement right now. We discussed the performance of that with injection of radioactive tracer and blue dye. We discussed that she would have an incision underneath her axillary hairline. We discussed that there is a bout a 10-20% chance of having a positive node with a sentinel lymph node biopsy and we will await the permanent pathology to make any other first further decisions in terms of her treatment. One of these options might be to return to the operating room to perform an axillary lymph node dissection. We discussed about a 1-2% risk lifetime of chronic shoulder pain as well as lymphedema associated with a sentinel lymph node biopsy. We discussed the options for treatment of the breast cancer which  included lumpectomy versus a mastectomy. We discussed the performance of the lumpectomy with a wire placement. We discussed a 10-20% chance of a positive margin requiring reexcision in the operating room. We also discussed that she may need radiation therapy or antiestrogen therapy or both if she undergoes lumpectomy. We discussed the mastectomy and the postoperative care for that as well.  We discussed that there is no difference in her survival whether she undergoes lumpectomy with radiation therapy or antiestrogen therapy versus a mastectomy. There is a slight difference in the local recurrence rate being 3-5% with lumpectomy and about 1% with a mastectomy. We discussed the risks of operation including bleeding, infection, possible reoperation. She understands her further therapy will be based on what her stages at the time of her operation.  Pt Education - [facility] Breast Cancer Information Given - [employer] "Breast Journey" Package Pt Education - [facility] (After Breast Cancer) Class Info: discussed with patient and provided information.

## 2016-12-07 NOTE — Transfer of Care (Signed)
Immediate Anesthesia Transfer of Care Note  Patient: Wendy [name]  Procedure(s) Performed: Procedure(s): LEFT BREAST LUMPECTOMY WITH RADIOACTIVE SEED AND SENTINEL LYMPH NODE BIOPSY (Left)  Patient Location: PACU  Anesthesia Type:General  Level of Consciousness: sedated  Airway & Oxygen Therapy: Patient Spontanous Breathing and Patient connected to face mask oxygen  Post-op Assessment: Report given to RN and Post -op Vital signs reviewed and stable  Post vital signs: Reviewed and stable  Last Vitals:  Vitals:   12/07/16 0810 12/07/16 0815  BP: 129/62 (!) 125/54  Pulse: (!) 56 (!) 57  Resp: 14 14  Temp:      Last Pain:  Vitals:   12/07/16 0730  TempSrc: Oral         Complications: No apparent anesthesia complications

## 2016-12-07 NOTE — Addendum Note (Signed)
Addendum  created 12/07/16 1301 by Roberts Gaudy, MD   Anesthesia Intra Blocks edited, Child order released for a procedure order, Sign clinical note

## 2016-12-07 NOTE — Progress Notes (Signed)
nuc med staff performed nuc med inj. No additional sedation required. Pt tol well. Will retrieve family from lobby and update/provide emotional support

## 2016-12-07 NOTE — Discharge Instructions (Signed)
Post Anesthesia Home Care Instructions  Activity: Get plenty of rest for the remainder of the day. A responsible adult should stay with you for 24 hours following the procedure.  For the next 24 hours, DO NOT: -Drive a car -Paediatric nurse -Drink alcoholic beverages -Take any medication unless instructed by your physician -Make any legal decisions or sign important papers.  Meals: Start with liquid foods such as gelatin or soup. Progress to regular foods as tolerated. Avoid greasy, spicy, heavy foods. If nausea and/or vomiting occur, drink only clear liquids until the nausea and/or vomiting subsides. Call your physician if vomiting continues.  Special Instructions/Symptoms: Your throat may feel dry or sore from the anesthesia or the breathing tube placed in your throat during surgery. If this causes discomfort, gargle with warm salt water. The discomfort should disappear within 24 hours.  If you had a scopolamine patch placed behind your ear for the management of post- operative nausea and/or vomiting:  1. The medication in the patch is effective for 72 hours, after which it should be removed.  Wrap patch in a tissue and discard in the trash. Wash hands thoroughly with soap and water. 2. You may remove the patch earlier than 72 hours if you experience unpleasant side effects which may include dry mouth, dizziness or visual disturbances. 3. Avoid touching the patch. Wash your hands with soap and water after contact with the patch.       [facility] Phone Number [phone]  BREAST BIOPSY/ PARTIAL MASTECTOMY: POST OP INSTRUCTIONS  Always review your discharge instruction sheet given to you by the facility where your surgery was performed.  IF YOU HAVE DISABILITY OR FAMILY LEAVE FORMS, YOU MUST BRING THEM TO THE OFFICE FOR PROCESSING.  DO NOT GIVE THEM TO YOUR DOCTOR.  1. A prescription for pain medication may be given to you upon discharge.  Take your  pain medication as prescribed, if needed.  If narcotic pain medicine is not needed, then you may take acetaminophen (Tylenol) or ibuprofen (Advil) as needed. 2. Take your usually prescribed medications unless otherwise directed 3. If you need a refill on your pain medication, please contact your pharmacy.  They will contact our office to request authorization.  Prescriptions will not be filled after 5pm or on week-ends. 4. You should eat very light the first 24 hours after surgery, such as soup, crackers, pudding, etc.  Resume your normal diet the day after surgery. 5. Most patients will experience some swelling and bruising in the breast.  Ice packs and a good support bra will help.  Swelling and bruising can take several days to resolve.  6. It is common to experience some constipation if taking pain medication after surgery.  Increasing fluid intake and taking a stool softener will usually help or prevent this problem from occurring.  A mild laxative (Milk of Magnesia or Miralax) should be taken according to package directions if there are no bowel movements after 48 hours. 7. Unless discharge instructions indicate otherwise, you may remove your bandages 24-48 hours after surgery, and you may shower at that time.  You may have steri-strips (small skin tapes) in place directly over the incision.  These strips should be left on the skin for 7-10 days.  If your surgeon used skin glue on the incision, you may shower in 24 hours.  The glue will flake off over the next 2-3 weeks.  Any sutures or staples will be removed at the office during your follow-up visit.  8. ACTIVITIES:  You may resume regular daily activities (gradually increasing) beginning the next day.  Wearing a good support bra or sports bra minimizes pain and swelling.  You may have sexual intercourse when it is comfortable. a. You may drive when you no longer are taking prescription pain medication, you can comfortably wear a seatbelt, and you can  safely maneuver your car and apply brakes. b. RETURN TO WORK:  ______________________________________________________________________________________ 9. You should see your doctor in the office for a follow-up appointment approximately two weeks after your surgery.  Your doctors nurse will typically make your follow-up appointment when she calls you with your pathology report.  Expect your pathology report 2-3 business days after your surgery.  You may call to check if you do not hear from [name] after three days. 10. OTHER INSTRUCTIONS: _______________________________________________________________________________________________ _____________________________________________________________________________________________________________________________________ _____________________________________________________________________________________________________________________________________ _____________________________________________________________________________________________________________________________________  WHEN TO CALL YOUR DOCTOR: 1. Fever over 101.0 2. Nausea and/or vomiting. 3. Extreme swelling or bruising. 4. Continued bleeding from incision. 5. Increased pain, redness, or drainage from the incision.  The clinic staff is available to answer your questions during regular business hours.  Please dont hesitate to call and ask to speak to one of the nurses for clinical concerns.  If you have a medical emergency, go to the nearest emergency room or call 911.  A surgeon from [facility] is always on call at the hospital.  For further questions, please visit [email or web address]  Surgical [facility] Surgical drains are used to remove extra fluid that normally builds up in a surgical wound after surgery. A surgical drain helps to heal a surgical wound. Different kinds of surgical drains include:  Active drains. These drains use suction to pull drainage away  from the surgical wound. Drainage flows through a tube to a container outside of the body. It is important to keep the bulb or the drainage container flat (compressed) at all times, except while you empty it. Flattening the bulb or container creates suction. The two most common types of active drains are bulb drains and Hemovac drains.  Passive drains. These drains allow fluid to drain naturally, by gravity. Drainage flows through a tube to a bandage (dressing) or a container outside of the body. Passive drains do not need to be emptied. The most common type of passive drain is the Penrose drain. A drain is placed during surgery. Immediately after surgery, drainage is usually bright red and a little thicker than water. The drainage may gradually turn yellow or pink and become thinner. It is likely that your health care provider will remove the drain when the drainage stops or when the amount decreases to 1-2 Tbsp (15-30 mL) during a 24-hour period. How to care for your surgical drain  Keep the skin around the drain dry and covered with a dressing at all times.  Check your drain area every day for signs of infection. Check for:  More redness, swelling, or pain.  Pus or a bad smell.  Cloudy drainage. Follow instructions from your health care provider about how to take care of your drain and how to change your dressing. Change your dressing at least one time every day. Change it more often if needed to keep the dressing dry. Make sure you: 1. Gather your supplies, including:  Tape.  Germ-free cleaning solution (sterile saline).  Split gauze drain sponge: 4 x 4 inches (10 x 10 cm).  Gauze square: 4 x 4 inches (10 x 10 cm). 2. Wash your hands with  soap and water before you change your dressing. If soap and water are not available, use hand sanitizer. 3. Remove the old dressing. Avoid using scissors to do that. 4. Use sterile saline to clean your skin around the drain. 5. Place the tube through  the slit in a drain sponge. Place the drain sponge so that it covers your wound. 6. Place the gauze square or another drain sponge on top of the drain sponge that is on the wound. Make sure the tube is between those layers. 7. Tape the dressing to your skin. 8. If you have an active bulb or Hemovac drain, tape the drainage tube to your skin 1-2 inches (2.5-5 cm) below the place where the tube enters your body. Taping keeps the tube from pulling on any stitches (sutures) that you have. 9. Wash your hands with soap and water. 10. Write down the color of your drainage and how often you change your dressing. How to empty your active bulb or Hemovac drain 1. Make sure that you have a measuring cup that you can empty your drainage into. 2. Wash your hands with soap and water. If soap and water are not available, use hand sanitizer. 3. Gently move your fingers down the tube while squeezing very lightly. This is called stripping the tube. This clears any drainage, clots, or tissue from the tube.  Do not pull on the tube.  You may need to strip the tube several times every day to keep the tube clear. 4. Open the bulb cap or the drain plug. Do not touch the inside of the cap or the bottom of the plug. 5. Empty all of the drainage into the measuring cup. 6. Compress the bulb or the container and replace the cap or the plug. To compress the bulb or the container, squeeze it firmly in the middle while you close the cap or plug the container. 7. Write down the amount of drainage that you have in each 24-hour period. If you have less than 2 Tbsp (30 mL) of drainage during 24 hours, contact your health care provider. 8. Flush the drainage down the toilet. 9. Wash your hands with soap and water. Contact a health care provider if:  You have more redness, swelling, or pain around your drain area.  The amount of drainage that you have is increasing instead of decreasing.  You have pus or a bad smell coming  from your drain area.  You have a fever.  You have drainage that is cloudy.  There is a sudden stop or a sudden decrease in the amount of drainage that you have.  Your tube falls out.  Your active draindoes not stay compressedafter you empty it. This information is not intended to replace advice given to you by your health care provider. Make sure you discuss any questions you have with your health care provider. Document Released: 09/15/2000 Document Revised: 02/24/2016 Document Reviewed: 04/07/2015 Elsevier Interactive Patient Education  2017 [employer].

## 2016-12-09 ENCOUNTER — Encounter (HOSPITAL_COMMUNITY): Payer: Self-pay | Admitting: Emergency Medicine

## 2016-12-09 ENCOUNTER — Emergency Department (HOSPITAL_COMMUNITY)
Admission: EM | Admit: 2016-12-09 | Discharge: 2016-12-09 | Disposition: A | Discharge status: Home / Self Care | Payer: Medicare Other | Attending: Dermatology | Admitting: Dermatology

## 2016-12-09 DIAGNOSIS — Z794 Long term (current) use of insulin: Secondary | ICD-10-CM | POA: Diagnosis not present

## 2016-12-09 DIAGNOSIS — N6489 Other specified disorders of breast: Secondary | ICD-10-CM | POA: Insufficient documentation

## 2016-12-09 DIAGNOSIS — Z7982 Long term (current) use of aspirin: Secondary | ICD-10-CM | POA: Insufficient documentation

## 2016-12-09 DIAGNOSIS — Z9889 Other specified postprocedural states: Secondary | ICD-10-CM | POA: Diagnosis not present

## 2016-12-09 DIAGNOSIS — Z853 Personal history of malignant neoplasm of breast: Secondary | ICD-10-CM | POA: Insufficient documentation

## 2016-12-09 DIAGNOSIS — Z5321 Procedure and treatment not carried out due to patient leaving prior to being seen by health care provider: Secondary | ICD-10-CM | POA: Insufficient documentation

## 2016-12-09 NOTE — ED Triage Notes (Signed)
Pt had some lymph nodes removed in left breast on March 8th. Pt has drainage tube that she reports has not been suctioning well. The bulb will pop open and not suction the drainage. Pt's MD advised her to come here to evaluate the drain for leakage.

## 2016-12-09 NOTE — ED Notes (Signed)
Pt and family requesting to leave, reports tired of waiting. Explained delayed and informed pt to follow up with doctor

## 2016-12-10 ENCOUNTER — Encounter (HOSPITAL_BASED_OUTPATIENT_CLINIC_OR_DEPARTMENT_OTHER): Payer: Self-pay | Admitting: Surgery

## 2016-12-11 ENCOUNTER — Ambulatory Visit: Payer: Self-pay | Admitting: Surgery

## 2016-12-11 NOTE — H&P (Signed)
Wendy [name] 12/11/2016 10:52 AM Location: [facility] Patient #: [id number] DOB: [birth year] Married / Language: English / Race: Black or African American Female  History of Present Illness Marcello Moores A. 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 Her final pathology showed a positive margin as well as 1 of 2 positive nodes. Total size was reported 4 cm. She had a drain in place secondary to reconstruction of the breasts intraoperatively. This is no longer working in and is removed today. She denies pain.                                 1. 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 Lymph node, sentinel, biopsy, Left Axillary ONE BENIGN LYMPH NODE (0/1) 3. Lymph node, sentinel, biopsy, Left METASTATIC CARCINOMA IN ONE OF ONE LYMPH NODE (1/1) 4. Breast, excision, Left additional Medial Margin BENIGN BREAST TISSUE Microscopic Comment 1. BREAST, INVASIVE TUMOR Procedure: Lumpectomy Laterality: Left Tumor Size: 3.4 cm Histologic Type: Ductal carcinoma Grade: Tubular Differentiation: 3 Nuclear Pleomorphism: 2 Mitotic Count:3 Ductal Carcinoma in Situ (DCIS): present Extent of Tumor: Skin: negative Nipple: negative Skeletal muscle: negative Margins: Invasive carcinoma, distance from closest margin: invasive carcinoma presented at the superior cauterized margin DCIS, distance from closest margin: 0.1 cm from the superior margin Regional Lymph Nodes: Number of Lymph Nodes Examined: 1 of 3 FINAL for HALIYAH, FRYMAN ([id number]) Microscopic Comment(continued) Number of Sentinel Lymph Nodes Examined: 2 Lymph Nodes with Macrometastases: 1 Lymph Nodes with Micrometastases: 0 Lymph Nodes with Isolated Tumor  Cells: 0 Breast Prognostic Profile: Estrogen Receptor: 80% Progesterone Receptor: 0% Her2: Negative Ki-67: 90% Pathologic Stage Classification (pTNM, AJCC 8th Edition): Primary Tumor (pT): pT2 Regional Lymph Nodes (pN): pN1 Distant Metastases (pM): pMx Casimer Lanius MD Pathologist, Electronic Signature (Case signed 12/10/2016) Specimen Gross and Clinical Information Specimen(s) Obtained: 1. Breast, lumpectomy, Left w/seed 2. Lymph node, sentinel, biopsy, Left Axillary 3. Lymph node, sentinel, biopsy, Left 4. Breast, excision, Left additional Medial Margin Specimen Clinical Information 1. Left breast cancer (nt) Gross 1. Specimen type: Received fresh and placed in formalin at 10:10 a.m. is a left breast lumpectomy specimen. Size: 6.0 cm from superior to inferior x 5.5 cm from medial to lateral x 3.4 cm from anterior to posterior, also containing a 5.5 x 0.8 cm ellipse of tan skin on the anterior surface. Orientation: The specimen has been previously inked green anterior, blue inferior, orange lateral, yellow medial, black posterior and red superior. Localized area: A green pin is placed to indicate a radioactive seed and a yellow pin to indicate a biopsy clip. The radioactive seed is retrieved and sent to nuclear medicine. Cut surface: Sectioning reveals a 3.4 x 3.2 x 2.5 cm firm, pink-white, stellate lesion containing a ribbon shaped biopsy clip. The remaining cut surface is mostly yellow lobulated adipose tissue. Margins: The lesion lesion located 0.1 cm from the medial and superior margin, 0.3 cm from the anterior, 0.4 cm from the lateral, 0.5 cm from the posterior and is 1.0 cm from the inferior margin. Prognostic indicators: Not taken at time of gross. Representative sections are submitted for research. Block summary: A= lesion to superior margin. B= lesion with medial margin. C= lesion with anterior  margin D= lesion with lateral margin. E-F= representative lesion. G=  representative lesion with posterior margin. 2 of 3 FINAL for KAILI, CASTILLE ([id number]) Gross(continued) H= inferior margin and uninvolved fibrous tissue. 8 blocks total. 2. Received fresh is a 3.0 x 2.0 x 1.0 cm aggregate of yellow lobulated adipose tissue sectioned to reveal two distinct lymph nodes which are separately submitted as parts 2 and 3. The first is a 0.6 x 0.6 x 0.3 cm ovoid pink tissue submitted entirely in block 2A. 3. Received in the specimen container with part 2 is a 1.7 x 1.0 x 0.8 cm ovoid blue tinged tissue with a pink-blue cut surface which is entirely submitted in two blocks. 4. Received fresh is a 3.7 x 2.5 x 0.4 cm portion of yellow lobulated adipose tissue inked yellow to designate the new medial margin. Sectioning reveals a focally firm white cut surface with blue tinged dye. The specimen is entirely submitted in six blocks A-F. (KF:gt, 12/09/15) Report signed out from the following location(s) Technical Component was performed at [facility]. [address],[place] [address].[id number]., Interpretation was performed at [address], [place], [place] [address]. CLIA #: [id number], 3 of 3.  The patient is a 76 year old female.   Problem List/Past Medical Sharyn Lull R. Brooks, CMA; 12/11/2016 10:53 AM) BREAST CANCER, LEFT (X32.440)  Past Surgical History Sharyn Lull R. Brooks, CMA; 12/11/2016 10:52 AM) Breast Biopsy Left.  Diagnostic Studies History Sharyn Lull R. Brooks, CMA; 12/11/2016 10:52 AM) Colonoscopy 1-5 years ago Pap Smear 1-5 years ago  Allergies Sharyn Lull R. Brooks, CMA; 12/11/2016 10:52 AM) No Known Allergies 11/13/2016  Medication History Sharyn Lull R. Brooks, CMA; 12/11/2016 10:52 AM) AmLODIPine Besylate ('10MG'$  Tablet, Oral) Active. Basaglar KwikPen (100UNIT/ML Soln Pen-inj, Subcutaneous) Active. BD Insulin Syringe (30G X 1/2"0.5 ML Misc,) Active. BD Insulin Syr  Ultrafine II (31G X 5/16"0.5 ML Misc,) Active. Carvedilol ('25MG'$  Tablet, Oral) Active. CloNIDine HCl (0.'3MG'$  Tablet, Oral) Active. HydroCHLOROthiazide ('25MG'$  Tablet, Oral) Active. Lantus (100UNIT/ML Solution, Subcutaneous) Active. Lisinopril ('40MG'$  Tablet, Oral) Active. MetFORMIN HCl ('500MG'$  Tablet, Oral) Active. NovoLOG (100UNIT/ML Solution, Subcutaneous) Active. Rosuvastatin Calcium ('5MG'$  Tablet, Oral) Active. Medications Reconciled  Social History Sharyn Lull R. Brooks, CMA; 12/11/2016 10:53 AM) Caffeine use Carbonated beverages, Coffee. No alcohol use No drug use Tobacco use Never smoker.  Family History Sharyn Lull R. Rolena Infante, CMA; 12/11/2016 10:53 AM) Arthritis Sister. Diabetes Mellitus Brother, Sister. Heart Disease Brother. Hypertension Brother, Sister.  Pregnancy / Birth History Sharyn Lull R. Rolena Infante, CMA; 12/11/2016 10:53 AM) Age at menarche 36 years. Age of menopause 36-50 Gravida 65 Maternal age 48-25 Para 71  Other Problems Sharyn Lull R. Brooks, CMA; 12/11/2016 10:53 AM) Breast Cancer Diabetes Mellitus Gastroesophageal Reflux Disease High blood pressure Hypercholesterolemia    Vitals Sharyn Lull R. Brooks CMA; 12/11/2016 10:52 AM) 12/11/2016 10:52 AM Weight: 177.13 lb Height: 62in Body Surface Area: 1.82 m Body Mass Index: 32.4 kg/m  BP: 132/88 (Sitting, Left Arm, Standard)      Physical Exam (Annelle Behrendt A. Jamarr Treinen MD; 12/11/2016 11:13 AM)  Breast Note: JP drain removed from left breast. Incision intact and clean. No signs of infection or significant seroma.    Assessment & Plan (Eduard Penkala A. Johnasia Liese MD; 12/11/2016 11:12 AM)  POST-OPERATIVE STATE [id number]) Impression: Patient requires reexcision lumpectomy on the left. This was discussed today. She has agreed to proceed. Risk of lumpectomy include bleeding, infection, seroma, more surgery, use of seed/wire, wound care, cosmetic deformity and the need for other treatments, death , blood  clots, death. Pt  agrees to proceed.

## 2016-12-12 ENCOUNTER — Telehealth: Payer: Self-pay | Admitting: Internal Medicine

## 2016-12-12 ENCOUNTER — Other Ambulatory Visit: Payer: Self-pay

## 2016-12-12 MED ORDER — INSULIN PEN NEEDLE 31G X 5 MM MISC: 3 refills | Status: DC

## 2016-12-12 MED ORDER — INSULIN PEN NEEDLE 31G X 5 MM MISC: 3 refills | Status: AC

## 2016-12-12 MED ORDER — INSULIN PEN NEEDLE 31G X 5 MM MISC
3 refills | Status: DC
Start: 2016-12-12 — End: 2016-12-12

## 2016-12-12 NOTE — Telephone Encounter (Signed)
Called pharmacy, will refax the order for pen needles.

## 2016-12-12 NOTE — Telephone Encounter (Signed)
Will call pharmacy and advised that pen needles were submitted.

## 2016-12-12 NOTE — Telephone Encounter (Signed)
Pt's pharmacy is stating we did not call in pen needles, I see the rx form 11:44 today however they are stating this is for syringes please assist

## 2016-12-13 ENCOUNTER — Ambulatory Visit: Payer: Medicare Other | Admitting: Internal Medicine

## 2016-12-13 ENCOUNTER — Other Ambulatory Visit: Payer: Self-pay | Admitting: Internal Medicine

## 2016-12-21 ENCOUNTER — Telehealth: Payer: Self-pay | Admitting: *Deleted

## 2016-12-21 NOTE — Telephone Encounter (Signed)
Received order for oncotype testing. Requisition sent to pathology 

## 2016-12-21 NOTE — Telephone Encounter (Signed)
Mammaprint ordered and oncotype cancel d/t positive LN. Pathology notified of new order

## 2016-12-27 ENCOUNTER — Encounter (HOSPITAL_COMMUNITY): Payer: Self-pay | Admitting: *Deleted

## 2016-12-27 ENCOUNTER — Telehealth: Payer: Self-pay | Admitting: *Deleted

## 2016-12-27 ENCOUNTER — Other Ambulatory Visit: Payer: Self-pay | Admitting: *Deleted

## 2016-12-27 DIAGNOSIS — Z170 Estrogen receptor positive status [ER+]: Principal | ICD-10-CM

## 2016-12-27 DIAGNOSIS — C50412 Malignant neoplasm of upper-outer quadrant of left female breast: Secondary | ICD-10-CM

## 2016-12-27 MED ORDER — CEFAZOLIN SODIUM 10 G IJ SOLR
3.0000 g | INTRAMUSCULAR | Status: AC
  Administered 2016-12-28: 3 g via INTRAVENOUS
  Filled 2016-12-27: qty 3000

## 2016-12-27 NOTE — Progress Notes (Signed)
[facility]  [phone]) [phone] [phone]  Clinic Follow Up Note   Patient Care Team: Binnie Rail, MD as PCP - General (Internal Medicine) Philemon Kingdom, MD as Consulting Physician (Internal Medicine) Jalene Mullet, MD as Consulting Physician (Ophthalmology) Erroll Luna, MD as Consulting Physician (General Surgery) Kyung Rudd, MD as Consulting Physician (Radiation Oncology) [name], MD as Consulting Physician (Hematology) 01/03/2017  CHIEF COMPLAINTS:  Follow Up for Invasive Ductal Carcinoma of the left breast  Oncology History   Cancer Staging Malignant neoplasm of upper-outer quadrant of left breast in female, estrogen receptor positive ([place]) Staging form: Breast, AJCC 8th Edition - Clinical: Stage IIB (cT2, cN0, cM0, G3, ER: Positive, PR: Negative, HER2: Negative) - Signed by [name], MD on 11/23/2016 - Pathologic stage from 12/28/2016: Stage IIB (pT2, pN1a(sn), cM0, G3, ER: Positive, PR: Negative, HER2: Negative) - Signed by [name], MD on 01/02/2017       Malignant neoplasm of upper-outer quadrant of left breast in female, estrogen receptor positive ([place])   11/01/2016 Mammogram    Diagnostic mammogram and ultrasound of left breast and axilla showed a 3.1 x 2.1 x 1.4 cm (3.3 x 2.0 x 2.7 cm by ultrasound) mass in the upper outer quadrant of the anterior third of the left breast, associated with pleomorphic calcification. There is a 8 mm (1.8cm by [name]) prominent lymph node in the left axilla.      11/07/2016 Initial Biopsy    Left breast 1:00 position biopsy showed invasive ductal carcinoma and DCIS, G3, left axillary node biopsy was negative.      11/07/2016 Receptors her2    ER 80% positive, PR negative, HER-2 negative, Ki-67 90%      11/20/2016 Initial Diagnosis    Malignant neoplasm of upper-outer quadrant of left breast in female, estrogen receptor positive ([place])     12/07/2016 Surgery    Left lumpectomy and left axillary sentinel lymph  node sampling by Dr. Brantley Stage      12/07/2016 Pathology Results    pT2, pN1 Left Lumpectomy: Grade 3 IDC measuring 3.4 cm, carcinoma broadly present at the superior margin. Grade 3 DCIS. 1 out of 2 left axillary SLN positive for metastatic carcinoma      12/07/2016 Miscellaneous    Mammaprint showed high risk disease, basal type       12/28/2016 Surgery    Re-excision of the previously positive superior margin was negative for malignant cells.        HISTORY OF PRESENTING ILLNESS:  Wendy [name] 76 y.o. female is here because of invasive ductal carcinoma of the left breast. She is accompanied by her son to my clinic today. She was referred by her breast surgeon Dr. Brantley Stage.   About 2 months ago, the patient noted a mass on her left breast. She underwent a mammogram and breast US on 11/01/2016, which showed a 3.1 cm mass in the upper outer quadrant of the left breast with pleomorphic calcifications. Imaging also noted abnormal appearing lymph nodes in the left axilla. A core biopsy was done on 11/07/2016, which revealed invasive ductal carcinoma ER positive PR negative with a Ki-67 of 90%. On November 13, 2016 she met with Dr. Brantley Stage to discuss a lumpectomy. She presents for further treatment.   She presents with her son today. She felt the lump herself about 2 months ago. She noticed last month that it had gotten a little bigger. So she went to her PCP, who ordered a mammogram. She does not  get mammograms every year. Her last mammogram was May 2017, which showed no cancer. She had some pain and itching in her left nipple before her diagnosis. She has some lower back pain if she is laying down too long, but that has been present for a while. Denies any new bone or joint pain, weight loss, breast swelling, nipple discharge, or any other concerns. She is very active and takes care of herself and her husband.   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 They have a nurse for the  rest of the week. He has dementia as well as numerous other health problems. Her son has moved in with her to help with her husband. She has a history of HTN and DM. Her DM is not well controlled; she takes medication for this. Her BP is also not well controlled on lisinopril. She has never had a heart attack or stroke. No surgeries in the past other than the removal of her benign lesion. No family history of breast cancer. Her father did have a brain tumor at the age of 33, which he passed from. Never smoker. She does not drink alcohol. She has 9 grandchildren.   GYN HISTORY  Menarchal: 76 y.o LMP: around 76 y.o Contraceptive: n/a HRT: No GP: 4 pregnancies, 4 children (3 boys, 1 girl). 1st child at age 84  CURRENT THERAPY: Pending adjuvant chemo Docetaxel and Cytoxan every 3 weeks, 4-6 cycles  INTVERVAL HISTORY: Wendy [name] returns for a follow up with her family. The patient states she is is doing well after her re-excision of the left breast on 12/28/16. She denies significant pain, her appetite and energy level has been back to normal. No other new complaints.  MEDICAL HISTORY:  Past Medical History:  Diagnosis Date  . Breast cancer ([place]) 11/2016   left  . GERD (gastroesophageal reflux disease)    TUMS as needed  . HTN (hypertension)    states BP has been high recently; has been on med. x 20 yr.  . Hyperlipidemia   . Insulin dependent diabetes mellitus ([place])     SURGICAL HISTORY: Past Surgical History:  Procedure Laterality Date  . BREAST LUMPECTOMY Left 05/05/2008  . BREAST LUMPECTOMY WITH RADIOACTIVE SEED AND SENTINEL LYMPH NODE BIOPSY Left 12/07/2016   Procedure: LEFT BREAST LUMPECTOMY WITH RADIOACTIVE SEED AND SENTINEL LYMPH NODE BIOPSY;  Surgeon: Erroll Luna, MD;  Location: [facility];  Service: General;  Laterality: Left;  . RE-EXCISION OF BREAST LUMPECTOMY Left 12/28/2016   Procedure: RE-EXCISION OF BREAST LUMPECTOMY;  Surgeon: Erroll Luna, MD;   Location: [facility];  Service: General;  Laterality: Left;    SOCIAL HISTORY: Social History   Social History  . Marital status: Married    Spouse name: N/A  . Number of children: 4  . Years of education: N/A   Occupational History  . [employer]   Social History Main Topics  . Smoking status: Never Smoker  . Smokeless tobacco: Current User    Types: Snuff  . Alcohol use No  . Drug use: No  . Sexual activity: Not Currently   Other Topics Concern  . Not on file   Social History Narrative  . No narrative on file    FAMILY HISTORY: Family History  Problem Relation Age of Onset  . Diabetes Sister     x 4  . Diabetes Brother   . Diabetes Paternal Grandmother   .  Diabetes Paternal Aunt   . Heart disease Brother   . Heart disease Sister   . Emphysema Brother   . Cancer Father 66    brain tumor     ALLERGIES:  has No Known Allergies.  MEDICATIONS:  Current Outpatient Prescriptions  Medication Sig Dispense Refill  . amLODipine (NORVASC) 10 MG tablet TAKE 1 TABLET (10 MG TOTAL) BY MOUTH DAILY. 30 tablet 5  . aspirin 81 MG tablet Take 81 mg by mouth daily.      . B-D INS SYRINGE 0.5CC/31GX5/16 31G X 5/16" 0.5 ML MISC USE AS DIRECTED 4 TIMES A DAY 300 each 1  . carvedilol (COREG) 25 MG tablet TAKE 1 TABLET BY MOUTH TWICE A DAY WITH A MEAL 180 tablet 1  . cloNIDine (CATAPRES) 0.3 MG tablet Take 0.3 mg by mouth 2 (two) times daily.    . hydrochlorothiazide (HYDRODIURIL) 25 MG tablet Take 1 tablet (25 mg total) by mouth daily. 30 tablet 5  . HYDROcodone-acetaminophen (NORCO/VICODIN) 5-325 MG tablet Take 1-2 tablets by mouth every 6 (six) hours as needed for moderate pain. 20 tablet 0  . insulin aspart (NOVOLOG) 100 UNIT/ML injection Inject 5-6 Units into the skin 2 (two) times daily with a meal.     . Insulin Glargine (BASAGLAR KWIKPEN) 100 UNIT/ML SOPN Inject 24 Units into the skin every morning.   3  . Insulin Pen Needle 31G X 5 MM MISC Use pen needles for  insulin injection daily 100 each 3  . Insulin Syringe-Needle U-100 (B-D INS SYRINGE 0.5CC/30GX1/2") 30G X 1/2" 0.5 ML MISC USE AS DIRECTED 4 TIMES A DAY 300 each 3  . lisinopril (PRINIVIL,ZESTRIL) 40 MG tablet TAKE 1 TABLET (40 MG TOTAL) BY MOUTH DAILY. 30 tablet 5  . metFORMIN (GLUCOPHAGE) 500 MG tablet Take 500 mg by mouth 2 (two) times daily with a meal.    . rosuvastatin (CRESTOR) 5 MG tablet Take 1 tablet (5 mg total) by mouth daily. -- need labs for further refills 90 tablet 0   No current facility-administered medications for this visit.     REVIEW OF SYSTEMS:   Constitutional: Denies fevers, chills or abnormal night sweats Eyes: Denies blurriness of vision, double vision or watery eyes Ears, nose, mouth, throat, and face: Denies mucositis or sore throat Respiratory: Denies cough, dyspnea or wheezes Cardiovascular: Denies palpitation, chest discomfort or lower extremity swelling Gastrointestinal:  Denies nausea, heartburn or change in bowel habits Skin: Denies abnormal skin rashes Lymphatics: Denies new lymphadenopathy or easy bruising Neurological:Denies numbness, tingling or new weaknesses Behavioral/Psych: Mood is stable, no new changes  Musculoskeletal: (+) lower back pain Breast: (+) breast pain and itching at L nipple All other systems were reviewed with the patient and are negative.  PHYSICAL EXAMINATION: ECOG PERFORMANCE STATUS: 0 - Asymptomatic  Vitals:   01/03/17 1420  BP: (!) 117/46  Pulse: 65  Resp: 17  Temp: 97.8 F (36.6 C)   Filed Weights   01/03/17 1420  Weight: 177 lb 1.6 oz (80.3 kg)   GENERAL:alert, no distress and comfortable SKIN: skin color, texture, turgor are normal, no rashes or significant lesions EYES: normal, conjunctiva are pink and non-injected, sclera clear OROPHARYNX:no exudate, no erythema and lips, buccal mucosa, and tongue normal  NECK: supple, thyroid normal size, non-tender, without nodularity LYMPH:  no palpable lymphadenopathy  in the cervical, axillary or inguinal LUNGS: clear to auscultation and percussion with normal breathing effort HEART: regular rate & rhythm and no murmurs and no lower extremity edema ABDOMEN:abdomen  soft, non-tender and normal bowel sounds Musculoskeletal:no cyanosis of digits and no clubbing  PSYCH: alert & oriented x 3 with fluent speech NEURO: no focal motor/sensory deficits Breasts: Breast inspection showed them to be symmetrical with no nipple discharge. Surgical scar in the left breast healing well, no discharge or skin erythema, no palpable mass or adenopathy. Old scar in inferior of L breast from previous lumpectomy.  LABORATORY DATA:  I have reviewed the data as listed CBC Latest Ref Rng & Units 12/28/2016 12/07/2016 07/13/2015  WBC 4.0 - 10.5 K/uL 7.3 - 7.1  Hemoglobin 12.0 - 15.0 g/dL 11.5(L) 12.8 11.9(L)  Hematocrit 36.0 - 46.0 % 33.4(L) - 35.1(L)  Platelets 150 - 400 K/uL 274 - 322.0   CMP Latest Ref Rng & Units 12/28/2016 12/04/2016 10/20/2015  Glucose 65 - 99 mg/dL 249(H) 183(H) 263(H)  BUN 6 - 20 mg/dL 15 18 28(H)  Creatinine 0.44 - 1.00 mg/dL 1.23(H) 1.18(H) 1.29(H)  Sodium 135 - 145 mmol/L 138 139 137  Potassium 3.5 - 5.1 mmol/L 3.9 4.1 4.4  Chloride 101 - 111 mmol/L 105 105 99  CO2 22 - 32 mmol/L _0 Calcium 8.9 - 10.3 mg/dL 9.0 9.6 9.4  Total Protein 6.5 - 8.1 g/dL 7.0 - 7.6  Total Bilirubin 0.3 - 1.2 mg/dL 0.5 - 0.3  Alkaline Phos 38 - 126 U/L 67 - 74  AST 15 - 41 U/L 14(L) - 6  ALT 14 - 54 U/L 8(L) - 5   PATHOLOGY: Diagnosis 12/28/16 Breast, excision, left, superior margin - BENIGN FIBROADIPOSE TISSUE WITH HEALING BIOPSY SITE. - BENIGN SKELETAL MUSCLE. - THERE IS NO EVIDENCE OF MALIGNANCY. - SEE COMMENT. Microscopic Comment The surgical resection margin(s) of the specimen were inked and microscopically evaluated. (JBK:ah 01/01/17) Enid Cutter MD Pathologist, Electronic Signature (Case signed 01/01/2017)  Mammaprint 12/07/16   Diagnosis 12/07/2016 1.  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 Lymph node, sentinel, biopsy, Left Axillary ONE BENIGN LYMPH NODE (0/1) 3. Lymph node, sentinel, biopsy, Left METASTATIC CARCINOMA IN ONE OF ONE LYMPH NODE (1/1) 4. Breast, excision, Left additional Medial Margin BENIGN BREAST TISSUE Microscopic Comment 1. BREAST, INVASIVE TUMOR Procedure: Lumpectomy Laterality: Left Tumor Size: 3.4 cm Histologic Type: Ductal carcinoma Grade: Tubular Differentiation: 3 Nuclear Pleomorphism: 2 Mitotic Count:3 Ductal Carcinoma in Situ (DCIS): present Extent of Tumor: Skin: negative Nipple: negative Skeletal muscle: negative Margins: Invasive carcinoma, distance from closest margin: invasive carcinoma presented at the superior cauterized margin DCIS, distance from closest margin: 0.1 cm from the superior margin Regional Lymph Nodes: Number of Lymph Nodes Examined: 1 of 3 FINAL for IVETH, HEIDEMANN ([id number]) Microscopic Comment(continued) Number of Sentinel Lymph Nodes Examined: 2 Lymph Nodes with Macrometastases: 1 Lymph Nodes with Micrometastases: 0 Lymph Nodes with Isolated Tumor Cells: 0 Breast Prognostic Profile: Estrogen Receptor: 80% Progesterone Receptor: 0% Her2: Negative Ki-67: 90% Pathologic Stage Classification (pTNM, AJCC 8th Edition): Primary Tumor (pT): pT2 Regional Lymph Nodes (pN): pN1 Distant Metastases (pM): pMx Casimer Lanius MD Pathologist, Electronic Signature (Case signed 12/10/2016)  Diagnosis 11/07/2016 1. Breast, left, needle core biopsy, 1:00 o'clock upper outer quadrant - INVASIVE DUCTAL CARCINOMA. - DUCTAL CARCINOMA IN SITU - SEE COMMENT. 2. Lymph node, needle/core biopsy, inferior left axilla - ONE PARTIALLY SAMPLED BENIGN LYMPH NODE WITH NO TUMOR SEEN. Microscopic Comment 1. Although definitive grading of  breast carcinoma is best done  on excision, the features of the invasive tumor from the left 1 o'clock upper outer quadrant breast biopsy are compatible with a grade 3 breast carcinoma. Breast prognostic markers will be performed and reported in an addendum. Findings are called to [facility] on 11/08/2016. Dr. Lyndon Code has seen the left 1:00 breast biopsy in consultation with agreement. 2. The findings from the left inferior axillary lymph node are called to the [place] on 11/08/2016. Dr. Lyndon Code has seen the lymph node biopsy in consultation with agreement. (RH:kh 11/08/16) 1. FLUORESCENCE IN-SITU HYBRIDIZATION Results: HER2 - NEGATIVE RATIO OF HER2/CEP17 SIGNALS 1.71 AVERAGE HER2 COPY NUMBER PER CELL 2.90 Reference Range: NEGATIVE HER2/CEP17 Ratio <2.0 and average HER2 copy number <4.0 EQUIVOCAL HER2/CEP17 Ratio <2.0 and average HER2 copy number >=4.0 and <6.0 POSITIVE HER2/CEP17 Ratio >=2.0 or <2.0 and average HER2 copy number >=6.0 1. PROGNOSTIC INDICATORS Results: IMMUNOHISTOCHEMICAL AND MORPHOMETRIC ANALYSIS PERFORMED MANUALLY Estrogen Receptor: 80%, POSITIVE, WEAK STAINING INTENSITY Progesterone Receptor: 0%, NEGATIVE Proliferation Marker Ki67: 90% COMMENT: The negative hormone receptor study(ies) in this case has An internal positive control. REFERENCE RANGE ESTROGEN RECEPTOR NEGATIVE 0% POSITIVE =>1% REFERENCE RANGE PROGESTERONE RECEPTOR NEGATIVE 0% POSITIVE =>1%  RADIOGRAPHIC STUDIES: I have personally reviewed the radiological images as listed and agreed with the findings in the report. Mm Breast Surgical Specimen  Result Date: 12/07/2016 CLINICAL DATA:  Evaluate surgical specimen following left lumpectomy for invasive ductal carcinoma. EXAM: SPECIMEN RADIOGRAPH OF THE LEFT BREAST COMPARISON:  Previous exam(s). FINDINGS: Status post excision of the left breast. The radioactive seed and biopsy marker clip are present, completely intact, and were marked  for pathology. IMPRESSION: Specimen radiograph of the left breast. Electronically Signed   By: Margarette Canada M.D.   On: 12/07/2016 09:31   Mm Lt Radioactive Seed Loc Mammo Guide  Result Date: 12/05/2016 CLINICAL DATA:  Patient for preoperative localization prior to left breast lumpectomy. EXAM: MAMMOGRAPHIC GUIDED RADIOACTIVE SEED LOCALIZATION OF THE LEFT BREAST COMPARISON:  Previous exam(s). FINDINGS: Patient presents for radioactive seed localization prior to left breast lumpectomy. I met with the patient and we discussed the procedure of seed localization including benefits and alternatives. We discussed the high likelihood of a successful procedure. We discussed the risks of the procedure including infection, bleeding, tissue injury and further surgery. We discussed the low dose of radioactivity involved in the procedure. Informed, written consent was given. The usual time-out protocol was performed immediately prior to the procedure. Using mammographic guidance, sterile technique, 1% lidocaine and an I-125 radioactive seed, left breast mass and biopsy marking clip were localized using a lateral approach. The follow-up mammogram images confirm the seed in the expected location and were marked for Dr. Brantley Stage. Follow-up survey of the patient confirms presence of the radioactive seed. Order number of I-125 seed:  [id number]. Total activity:  8.088 millicurie  Reference Date: 11/21/2016 The patient tolerated the procedure well and was released from the [facility]. She was given instructions regarding seed removal. IMPRESSION: Radioactive seed localization left breast. No apparent complications. Electronically Signed   By: Lovey Newcomer M.D.   On: 12/05/2016 13:46   ASSESSMENT & PLAN:  Mrs. Roswell is a 76 y.o. female who presented with a self palpable left breast mass  1. Malignant neoplasm of upper-outer quadrant of left breast, Invasive Ductal Carcinoma, pT2pN1M0, stage IIB, ER+/PR-/HER2-, G3 --I discussed  her surgical path result in details, she had reexcision for positive margins, her final surgical margins were negative. -Left lumpectomy and SLN biopsy  on 12/07/16 revealed grade 3 IDC measuring 3.4 cm, carcinoma was broadly present at the superior margin, grade 3 DCIS, and 1 out of 2 left axillary SLNs were positive for metastatic carcinoma. With carcinoma broadly present at the superior margin. Excision of the left superior margin on 12/28/16 showed no evidence of malignancy. -Mammaprint showed high risk disease, basal type.The average 10 year risk of recurrence without adjuvant therapy is 29%. -I recommend her to consider adjuvant chemotherapy to reduce her risk of recurrence after surgery. -Given her age and good PS, I advise moderate chemotherapy with 4-6 cycles of Docetaxel and Cytoxan, giving every 3 weeks intravenously. -Chemotherapy consent: Side effects including but does not not limited to, fatigue, nausea, vomiting, diarrhea, hair loss, neuropathy, fluid retention, renal and kidney dysfunction, neutropenic fever, needed for blood transfusion, bleeding, were discussed with patient in great detail. She agrees to proceed. -She would benefit from adjuvant breast radiation after chemotherapy. -Given her positive ER, she would also benefit from adjuvant aromatase inhibitor, which will start after she completes radiation.  2. HTN and DM -Not well controlled with medication -Follow with PCP -We discussed the potential impact of chemotherapy, especially premedication steroids, on her blood glucose and blood pressure. We'll monitor closely. -We discussed staying active and eating healthy.    Plan: -Port placement scheduled tomorrow. -chemo class  -Lab, f/u, and chemo TC on 01/18/17 in the morning.  All questions were answered. The patient knows to call the clinic with any problems, questions or concerns.  I spent 25 minutes counseling the patient face to face. The total time spent in the  appointment was 30 minutes and more than 50% was on counseling.   [name], MD 01/03/2017   This document serves as a record of services personally performed by [name], MD. It was created on her behalf by Darcus Austin, a trained medical scribe. The creation of this record is based on the scribe's personal observations and the provider's statements to them. This document has been checked and approved by the attending provider.

## 2016-12-27 NOTE — Progress Notes (Signed)
Pt denies SOB, chest pain, and being under the care of a cardiologist. Pt denies having a cardiac cath. Pt made aware to stop taking Aspirin, vitamins, fish oil and herbal medications. Do not take any NSAIDs ie: Ibuprofen, Advil, Naproxen, BC and Goody Powder or any medication containing Aspirin. Pt stated that she was instructed to take NO diabetic medications the morning of procedure. Pt made aware to check BG every 2 hours prior to arrival, take 4 glucose tabs for BG < 70 , recheck BG after taking tabs and call SS if BG remains < 70 after intervention. Pt verbalized understanding of all pre-op instructions.

## 2016-12-27 NOTE — Telephone Encounter (Signed)
Spoke with patient and discussed her mammaprint results of high risk.  Confirmed appointment with Dr. Burr Medico for 4/4 at 1015am to discuss chemo.  Discussed port placement for chemo.  I have also sent a message to Dr. Brantley Stage to see if they can add port placement tomorrow at time of re-excision.  Patient verbalized understanding.

## 2016-12-28 ENCOUNTER — Ambulatory Visit (HOSPITAL_COMMUNITY)
Admission: RE | Admit: 2016-12-28 | Discharge: 2016-12-28 | Disposition: A | Discharge status: Home / Self Care | Payer: Medicare Other | Source: Ambulatory Visit | Attending: Surgery | Admitting: Surgery

## 2016-12-28 ENCOUNTER — Ambulatory Visit (HOSPITAL_COMMUNITY): Payer: Medicare Other | Admitting: Anesthesiology

## 2016-12-28 ENCOUNTER — Encounter (HOSPITAL_COMMUNITY): Payer: Self-pay | Admitting: *Deleted

## 2016-12-28 ENCOUNTER — Encounter (HOSPITAL_COMMUNITY)
Admission: RE | Disposition: A | Discharge status: Home / Self Care | Payer: Self-pay | Source: Ambulatory Visit | Attending: Surgery

## 2016-12-28 DIAGNOSIS — E119 Type 2 diabetes mellitus without complications: Secondary | ICD-10-CM | POA: Diagnosis not present

## 2016-12-28 DIAGNOSIS — I10 Essential (primary) hypertension: Secondary | ICD-10-CM | POA: Diagnosis not present

## 2016-12-28 DIAGNOSIS — Z794 Long term (current) use of insulin: Secondary | ICD-10-CM | POA: Insufficient documentation

## 2016-12-28 DIAGNOSIS — C773 Secondary and unspecified malignant neoplasm of axilla and upper limb lymph nodes: Secondary | ICD-10-CM | POA: Insufficient documentation

## 2016-12-28 DIAGNOSIS — Z79899 Other long term (current) drug therapy: Secondary | ICD-10-CM | POA: Insufficient documentation

## 2016-12-28 DIAGNOSIS — E785 Hyperlipidemia, unspecified: Secondary | ICD-10-CM | POA: Diagnosis not present

## 2016-12-28 DIAGNOSIS — Z7982 Long term (current) use of aspirin: Secondary | ICD-10-CM | POA: Diagnosis not present

## 2016-12-28 DIAGNOSIS — C50912 Malignant neoplasm of unspecified site of left female breast: Secondary | ICD-10-CM | POA: Insufficient documentation

## 2016-12-28 HISTORY — PX: RE-EXCISION OF BREAST LUMPECTOMY: SHX6048

## 2016-12-28 LAB — GLUCOSE, CAPILLARY
Glucose-Capillary: 223 mg/dL — ABNORMAL HIGH (ref 65–99)
Glucose-Capillary: 213 mg/dL — ABNORMAL HIGH (ref 65–99)
Glucose-Capillary: 191 mg/dL — ABNORMAL HIGH (ref 65–99)

## 2016-12-28 LAB — COMPREHENSIVE METABOLIC PANEL
ALBUMIN: 3.7 g/dL (ref 3.5–5.0)
ALT: 8 U/L — ABNORMAL LOW (ref 14–54)
ANION GAP: 9 (ref 5–15)
AST: 14 U/L — AB (ref 15–41)
Alkaline Phosphatase: 67 U/L (ref 38–126)
BUN: 15 mg/dL (ref 6–20)
CHLORIDE: 105 mmol/L (ref 101–111)
CO2: 24 mmol/L (ref 22–32)
Calcium: 9 mg/dL (ref 8.9–10.3)
Creatinine, Ser: 1.23 mg/dL — ABNORMAL HIGH (ref 0.44–1.00)
GFR calc Af Amer: 48 mL/min — ABNORMAL LOW (ref >60)
GFR calc non Af Amer: 42 mL/min — ABNORMAL LOW (ref >60)
GLUCOSE: 249 mg/dL — AB (ref 65–99)
POTASSIUM: 3.9 mmol/L (ref 3.5–5.1)
SODIUM: 138 mmol/L (ref 135–145)
Total Bilirubin: 0.5 mg/dL (ref 0.3–1.2)
Total Protein: 7 g/dL (ref 6.5–8.1)

## 2016-12-28 LAB — CBC WITH DIFFERENTIAL/PLATELET
BASOS ABS: 0 10*3/uL (ref 0.0–0.1)
BASOS PCT: 0 %
EOS ABS: 0.8 10*3/uL — AB (ref 0.0–0.7)
EOS PCT: 11 %
HCT: 33.4 % — ABNORMAL LOW (ref 36.0–46.0)
Hemoglobin: 11.5 g/dL — ABNORMAL LOW (ref 12.0–15.0)
Lymphocytes Relative: 27 %
Lymphs Abs: 2 10*3/uL (ref 0.7–4.0)
MCH: 27.4 pg (ref 26.0–34.0)
MCHC: 34.4 g/dL (ref 30.0–36.0)
MCV: 79.7 fL (ref 78.0–100.0)
MONO ABS: 0.4 10*3/uL (ref 0.1–1.0)
Monocytes Relative: 5 %
NEUTROS ABS: 4.2 10*3/uL (ref 1.7–7.7)
Neutrophils Relative %: 57 %
PLATELETS: 274 10*3/uL (ref 150–400)
RBC: 4.19 MIL/uL (ref 3.87–5.11)
RDW: 14.6 % (ref 11.5–15.5)
WBC: 7.3 10*3/uL (ref 4.0–10.5)

## 2016-12-28 SURGERY — EXCISION, LESION, BREAST
Anesthesia: General | Site: Breast | Laterality: Left

## 2016-12-28 MED ORDER — HYDROCODONE-ACETAMINOPHEN 5-325 MG PO TABS
2.0000 | ORAL_TABLET | Freq: Once | ORAL | Status: AC
  Administered 2016-12-28: 2 via ORAL

## 2016-12-28 MED ORDER — BUPIVACAINE HCL (PF) 0.25 % IJ SOLN
INTRAMUSCULAR | Status: DC | PRN
  Administered 2016-12-28: 10 mL

## 2016-12-28 MED ORDER — KETOROLAC TROMETHAMINE 30 MG/ML IJ SOLN
INTRAMUSCULAR | Status: AC
  Filled 2016-12-28: qty 1

## 2016-12-28 MED ORDER — CHLORHEXIDINE GLUCONATE CLOTH 2 % EX PADS: 6.0000 | MEDICATED_PAD | Freq: Once | CUTANEOUS | Status: DC

## 2016-12-28 MED ORDER — FENTANYL CITRATE (PF) 250 MCG/5ML IJ SOLN
INTRAMUSCULAR | Status: AC
  Filled 2016-12-28: qty 5

## 2016-12-28 MED ORDER — MIDAZOLAM HCL 5 MG/5ML IJ SOLN
INTRAMUSCULAR | Status: DC | PRN
  Administered 2016-12-28: 1 mg via INTRAVENOUS

## 2016-12-28 MED ORDER — LIDOCAINE 2% (20 MG/ML) 5 ML SYRINGE
INTRAMUSCULAR | Status: DC | PRN
  Administered 2016-12-28: 60 mg via INTRAVENOUS

## 2016-12-28 MED ORDER — DEXAMETHASONE SODIUM PHOSPHATE 10 MG/ML IJ SOLN
INTRAMUSCULAR | Status: DC | PRN
  Administered 2016-12-28: 4 mg via INTRAVENOUS

## 2016-12-28 MED ORDER — EPHEDRINE 5 MG/ML INJ
INTRAVENOUS | Status: AC
  Filled 2016-12-28: qty 10

## 2016-12-28 MED ORDER — FENTANYL CITRATE (PF) 100 MCG/2ML IJ SOLN
INTRAMUSCULAR | Status: AC
  Filled 2016-12-28: qty 2

## 2016-12-28 MED ORDER — PROMETHAZINE HCL 25 MG/ML IJ SOLN: 6.2500 mg | INTRAMUSCULAR | Status: DC | PRN

## 2016-12-28 MED ORDER — BUPIVACAINE HCL (PF) 0.25 % IJ SOLN
INTRAMUSCULAR | Status: AC
  Filled 2016-12-28: qty 30

## 2016-12-28 MED ORDER — HYDROCODONE-ACETAMINOPHEN 5-325 MG PO TABS
ORAL_TABLET | ORAL | Status: AC
  Filled 2016-12-28: qty 2

## 2016-12-28 MED ORDER — PROPOFOL 10 MG/ML IV BOLUS
INTRAVENOUS | Status: AC
  Filled 2016-12-28: qty 20

## 2016-12-28 MED ORDER — LACTATED RINGERS IV SOLN
INTRAVENOUS | Status: DC
  Administered 2016-12-28 (×2): via INTRAVENOUS

## 2016-12-28 MED ORDER — MIDAZOLAM HCL 2 MG/2ML IJ SOLN
INTRAMUSCULAR | Status: AC
  Filled 2016-12-28: qty 2

## 2016-12-28 MED ORDER — ONDANSETRON HCL 4 MG/2ML IJ SOLN
INTRAMUSCULAR | Status: DC | PRN
  Administered 2016-12-28: 4 mg via INTRAVENOUS

## 2016-12-28 MED ORDER — FENTANYL CITRATE (PF) 100 MCG/2ML IJ SOLN
INTRAMUSCULAR | Status: DC | PRN
  Administered 2016-12-28: 25 ug via INTRAVENOUS
  Administered 2016-12-28: 50 ug via INTRAVENOUS
  Administered 2016-12-28 (×2): 25 ug via INTRAVENOUS

## 2016-12-28 MED ORDER — PROPOFOL 10 MG/ML IV BOLUS
INTRAVENOUS | Status: DC | PRN
  Administered 2016-12-28: 20 mg via INTRAVENOUS
  Administered 2016-12-28: 150 mg via INTRAVENOUS

## 2016-12-28 MED ORDER — KETOROLAC TROMETHAMINE 30 MG/ML IJ SOLN
30.0000 mg | Freq: Once | INTRAMUSCULAR | Status: DC | PRN
  Administered 2016-12-28: 30 mg via INTRAVENOUS

## 2016-12-28 MED ORDER — FENTANYL CITRATE (PF) 100 MCG/2ML IJ SOLN
25.0000 ug | INTRAMUSCULAR | Status: DC | PRN
  Administered 2016-12-28 (×3): 50 ug via INTRAVENOUS

## 2016-12-28 MED ORDER — EPHEDRINE SULFATE-NACL 50-0.9 MG/10ML-% IV SOSY
PREFILLED_SYRINGE | INTRAVENOUS | Status: DC | PRN
  Administered 2016-12-28 (×3): 10 mg via INTRAVENOUS

## 2016-12-28 MED ORDER — 0.9 % SODIUM CHLORIDE (POUR BTL) OPTIME
TOPICAL | Status: DC | PRN
  Administered 2016-12-28: 1000 mL

## 2016-12-28 MED ORDER — HYDROCODONE-ACETAMINOPHEN 5-325 MG PO TABS
1.0000 | ORAL_TABLET | Freq: Four times a day (QID) | ORAL | 0 refills | Status: DC | PRN
Start: 2016-12-28 — End: 2017-05-24

## 2016-12-28 SURGICAL SUPPLY — 44 items
APPLIER CLIP 9.375 MED OPEN (MISCELLANEOUS) ×3
BINDER BREAST XLRG (GAUZE/BANDAGES/DRESSINGS) ×3 IMPLANT
CANISTER SUCT 3000ML PPV (MISCELLANEOUS) IMPLANT
CHLORAPREP W/TINT 26ML (MISCELLANEOUS) ×3 IMPLANT
CLIP APPLIE 9.375 MED OPEN (MISCELLANEOUS) ×1 IMPLANT
CONT SPEC 4OZ CLIKSEAL STRL BL (MISCELLANEOUS) ×3 IMPLANT
COVER SURGICAL LIGHT HANDLE (MISCELLANEOUS) ×3 IMPLANT
DECANTER SPIKE VIAL GLASS SM (MISCELLANEOUS) IMPLANT
DERMABOND ADVANCED (GAUZE/BANDAGES/DRESSINGS) ×2
DERMABOND ADVANCED .7 DNX12 (GAUZE/BANDAGES/DRESSINGS) ×1 IMPLANT
DRAPE CHEST BREAST 15X10 FENES (DRAPES) ×3 IMPLANT
DRAPE UTILITY XL STRL (DRAPES) ×3 IMPLANT
ELECT CAUTERY BLADE 6.4 (BLADE) ×3 IMPLANT
ELECT REM PT RETURN 9FT ADLT (ELECTROSURGICAL) ×3
ELECTRODE REM PT RTRN 9FT ADLT (ELECTROSURGICAL) ×1 IMPLANT
GAUZE SPONGE 4X4 12PLY STRL (GAUZE/BANDAGES/DRESSINGS) ×3 IMPLANT
GLOVE BIO SURGEON STRL SZ8 (GLOVE) ×3 IMPLANT
GLOVE BIOGEL PI IND STRL 8 (GLOVE) ×1 IMPLANT
GLOVE BIOGEL PI INDICATOR 8 (GLOVE) ×2
GOWN STRL REUS W/ TWL LRG LVL3 (GOWN DISPOSABLE) ×2 IMPLANT
GOWN STRL REUS W/ TWL XL LVL3 (GOWN DISPOSABLE) ×1 IMPLANT
GOWN STRL REUS W/TWL LRG LVL3 (GOWN DISPOSABLE) ×4
GOWN STRL REUS W/TWL XL LVL3 (GOWN DISPOSABLE) ×2
KIT BASIN OR (CUSTOM PROCEDURE TRAY) ×3 IMPLANT
KIT MARKER MARGIN INK (KITS) ×3 IMPLANT
KIT ROOM TURNOVER OR (KITS) ×3 IMPLANT
LIGHT WAVEGUIDE WIDE FLAT (MISCELLANEOUS) IMPLANT
NEEDLE HYPO 25GX1X1/2 BEV (NEEDLE) ×3 IMPLANT
NS IRRIG 1000ML POUR BTL (IV SOLUTION) ×3 IMPLANT
PACK SURGICAL SETUP 50X90 (CUSTOM PROCEDURE TRAY) ×3 IMPLANT
PAD ARMBOARD 7.5X6 YLW CONV (MISCELLANEOUS) ×3 IMPLANT
PENCIL BUTTON HOLSTER BLD 10FT (ELECTRODE) ×3 IMPLANT
SPONGE LAP 4X18 X RAY DECT (DISPOSABLE) ×3 IMPLANT
SUT MNCRL AB 4-0 PS2 18 (SUTURE) ×3 IMPLANT
SUT VIC AB 3-0 SH 18 (SUTURE) ×3 IMPLANT
SUT VIC AB 3-0 SH 27 (SUTURE) ×2
SUT VIC AB 3-0 SH 27X BRD (SUTURE) ×1 IMPLANT
SYR BULB 3OZ (MISCELLANEOUS) ×3 IMPLANT
SYR CONTROL 10ML LL (SYRINGE) ×3 IMPLANT
TOWEL OR 17X24 6PK STRL BLUE (TOWEL DISPOSABLE) ×3 IMPLANT
TOWEL OR 17X26 10 PK STRL BLUE (TOWEL DISPOSABLE) IMPLANT
TUBE CONNECTING 12'X1/4 (SUCTIONS) ×1
TUBE CONNECTING 12X1/4 (SUCTIONS) ×2 IMPLANT
YANKAUER SUCT BULB TIP NO VENT (SUCTIONS) IMPLANT

## 2016-12-28 NOTE — Anesthesia Procedure Notes (Signed)
Procedure Name: LMA Insertion Date/Time: 12/28/2016 11:21 AM Performed by: Merrilyn Puma B Pre-anesthesia Checklist: Patient identified, Emergency Drugs available, Suction available, Patient being monitored and Timeout performed Patient Re-evaluated:Patient Re-evaluated prior to inductionOxygen Delivery Method: Circle system utilized Preoxygenation: Pre-oxygenation with 100% oxygen Intubation Type: IV induction Ventilation: Mask ventilation without difficulty LMA: LMA inserted LMA Size: 4.0 Number of attempts: 1 Placement Confirmation: positive ETCO2,  CO2 detector and breath sounds checked- equal and bilateral Tube secured with: Tape Dental Injury: Teeth and Oropharynx as per pre-operative assessment

## 2016-12-28 NOTE — Op Note (Addendum)
Left Breast Re-excison Lumpectomy Procedure Note  Indications:  This patient returns following an initial lumpectomy.  Analysis of the pathology specimen revealed microscopic involvement of the superior margins.  The patient now returns for re-excision.The procedure has been discussed with the patient. Alternatives to surgery have been discussed with the patient.  Risks of surgery include bleeding,  Infection,  Seroma formation, death,  and the need for further surgery.   The patient understands and wishes to proceed.  Pre-operative Diagnosis: left breast cancer  Post-operative Diagnosis: left breast cancer  Surgeon: Erroll Luna A.   Assistants: none   Anesthesia: General LMA anesthesia and Local anesthesia 0.25.% bupivacaine  ASA Class: 3  Procedure Details  The patient was seen in the Holding Room. The risks, benefits, complications, treatment options, and expected outcomes were discussed with the patient. The possibilities of reaction to medication, pulmonary aspiration, bleeding, infection, the need for additional procedures, failure to diagnose a condition, and creating a complication requiring transfusion or operation were discussed with the patient. The patient concurred with the proposed plan, giving informed consent.  The site of surgery properly noted/marked. The patient was taken to Operating Room # 2, identified as [name] and the procedure verified as Breast Re-excision Lumpectomy. A Time Out was held and the above information confirmed.  The patient was placed supine.  The breast was prepped and draped in standard fashion. Marcaine 0.25% with epinephrine was used to anesthetize the skin around the previous lumpectomy incision.  The incision was opened.  A  seroma was evacuated.  Additional local anesthesia was delivered superiorly within the lumpectomy cavity.  A full thickness re-excision was performed.  The new margin was inked and the specimen was submitted to pathology.   Hemostasis was achieved with cautery.  Closure was performed in 2 layers with a 3-0 Vicryl  AND 4 0 MONOCRYLsubcuticular closure.    Dermabond  was applied.  At the end of the operation, all sponge, instrument and needle counts were correct.   Findings: grossly clear surgical margins  Estimated Blood Loss:  less than 100 mL         Drains: none         Total IV Fluids: 800 ml         Specimens: above                  Complications:  None; patient tolerated the procedure well.         Disposition: PACU - hemodynamically stable.         Condition: stable  Attending Attestation: I performed the procedure.  The [place] narcotic database has been quieried and no conflicts identified.

## 2016-12-28 NOTE — Anesthesia Postprocedure Evaluation (Addendum)
Anesthesia Post Note  Patient: Wendy [name]  Procedure(s) Performed: Procedure(s) (LRB): RE-EXCISION OF BREAST LUMPECTOMY (Left)  Patient location during evaluation: PACU Anesthesia Type: General Level of consciousness: awake and alert Pain management: pain level controlled Vital Signs Assessment: post-procedure vital signs reviewed and stable Respiratory status: spontaneous breathing, nonlabored ventilation, respiratory function stable and patient connected to nasal cannula oxygen Cardiovascular status: blood pressure returned to baseline and stable Postop Assessment: no signs of nausea or vomiting Anesthetic complications: no       Last Vitals:  Vitals:   12/28/16 1225 12/28/16 1240  BP: (!) 163/73 (!) 162/75  Pulse: 76 68  Resp: 12 12  Temp:      Last Pain:  Vitals:   12/28/16 1238  TempSrc:   PainSc: 7                  Loza Prell S

## 2016-12-28 NOTE — Interval H&P Note (Signed)
History and Physical Interval Note:  12/28/2016 10:51 AM  Wendy [name]  has presented today for surgery, with the diagnosis of LEFT BREAST CANCER  The various methods of treatment have been discussed with the patient and family. After consideration of risks, benefits and other options for treatment, the patient has consented to  Procedure(s): RE-EXCISION OF BREAST LUMPECTOMY (Left) as a surgical intervention .  The patient's history has been reviewed, patient examined, no change in status, stable for surgery.  I have reviewed the patient's chart and labs.  Questions were answered to the patient's satisfaction.     Neidra Girvan A.

## 2016-12-28 NOTE — Transfer of Care (Signed)
Immediate Anesthesia Transfer of Care Note  Patient: Wendy [name]  Procedure(s) Performed: Procedure(s): RE-EXCISION OF BREAST LUMPECTOMY (Left)  Patient Location: PACU  Anesthesia Type:General  Level of Consciousness: awake, alert  and oriented  Airway & Oxygen Therapy: Patient Spontanous Breathing and Patient connected to nasal cannula oxygen  Post-op Assessment: Report given to RN, Post -op Vital signs reviewed and stable and Patient moving all extremities X 4  Post vital signs: Reviewed and stable  Last Vitals:  Vitals:   12/28/16 0839  BP: (!) 135/56  Pulse: 64  Resp: 20  Temp: 36.6 C    Last Pain:  Vitals:   12/28/16 0839  TempSrc: Oral      Patients Stated Pain Goal: 2 (78/97/84 7841)  Complications: No apparent anesthesia complications

## 2016-12-28 NOTE — Anesthesia Preprocedure Evaluation (Addendum)
Anesthesia Evaluation  Patient identified by MRN, date of birth, ID band Patient awake    Reviewed: Allergy & Precautions, NPO status , Patient's Chart, lab work & pertinent test results  Airway Mallampati: II  TM Distance: >3 FB Neck ROM: Full    Dental no notable dental hx.    Pulmonary neg pulmonary ROS,    Pulmonary exam normal breath sounds clear to auscultation       Cardiovascular hypertension, Pt. on medications Normal cardiovascular exam Rhythm:Regular Rate:Normal     Neuro/Psych negative neurological ROS  negative psych ROS   GI/Hepatic negative GI ROS, Neg liver ROS,   Endo/Other  diabetes, Insulin Dependent  Renal/GU Renal InsufficiencyRenal disease  negative genitourinary   Musculoskeletal negative musculoskeletal ROS (+)   Abdominal   Peds negative pediatric ROS (+)  Hematology negative hematology ROS (+)   Anesthesia Other Findings   Reproductive/Obstetrics negative OB ROS                            Anesthesia Physical Anesthesia Plan  ASA: III  Anesthesia Plan: General   Post-op Pain Management:    Induction: Intravenous  Airway Management Planned: LMA  Additional Equipment:   Intra-op Plan:   Post-operative Plan: Extubation in OR  Informed Consent: I have reviewed the patients History and Physical, chart, labs and discussed the procedure including the risks, benefits and alternatives for the proposed anesthesia with the patient or authorized representative who has indicated his/her understanding and acceptance.   Dental advisory given  Plan Discussed with: CRNA and Surgeon  Anesthesia Plan Comments:        Anesthesia Quick Evaluation

## 2016-12-28 NOTE — H&P (View-Only) (Signed)
Wendy [name] 12/11/2016 10:52 AM Location: [facility] Patient #: [id number] DOB: 01-Nov-1940 Married / Language: English / Race: Black or African American Female  History of Present Illness Wendy Moores A. Mckinzee Spirito MD; 12/11/2016 11:12 AM) Patient words: Patient returns for 5 days after left breast lumpectomy for stage II left breast cancer. Her final pathology showed a positive margin as well as 1 of 2 positive nodes. Total size was reported 4 cm. She had a drain in place secondary to reconstruction of the breasts intraoperatively. This is no longer working in and is removed today. She denies pain.                                 1. 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 Lymph node, sentinel, biopsy, Left Axillary ONE BENIGN LYMPH NODE (0/1) 3. Lymph node, sentinel, biopsy, Left METASTATIC CARCINOMA IN ONE OF ONE LYMPH NODE (1/1) 4. Breast, excision, Left additional Medial Margin BENIGN BREAST TISSUE Microscopic Comment 1. BREAST, INVASIVE TUMOR Procedure: Lumpectomy Laterality: Left Tumor Size: 3.4 cm Histologic Type: Ductal carcinoma Grade: Tubular Differentiation: 3 Nuclear Pleomorphism: 2 Mitotic Count:3 Ductal Carcinoma in Situ (DCIS): present Extent of Tumor: Skin: negative Nipple: negative Skeletal muscle: negative Margins: Invasive carcinoma, distance from closest margin: invasive carcinoma presented at the superior cauterized margin DCIS, distance from closest margin: 0.1 cm from the superior margin Regional Lymph Nodes: Number of Lymph Nodes Examined: 1 of 3 FINAL for Wendy [name], Wendy [name] ([id number]) Microscopic Comment(continued) Number of Sentinel Lymph Nodes Examined: 2 Lymph Nodes with Macrometastases: 1 Lymph Nodes with Micrometastases: 0 Lymph Nodes with Isolated Tumor  Cells: 0 Breast Prognostic Profile: Estrogen Receptor: 80% Progesterone Receptor: 0% Her2: Negative Ki-67: 90% Pathologic Stage Classification (pTNM, AJCC 8th Edition): Primary Tumor (pT): pT2 Regional Lymph Nodes (pN): pN1 Distant Metastases (pM): pMx Wendy Lanius MD Pathologist, Electronic Signature (Case signed 12/10/2016) Specimen Gross and Clinical Information Specimen(s) Obtained: 1. Breast, lumpectomy, Left w/seed 2. Lymph node, sentinel, biopsy, Left Axillary 3. Lymph node, sentinel, biopsy, Left 4. Breast, excision, Left additional Medial Margin Specimen Clinical Information 1. Left breast cancer (nt) Gross 1. Specimen type: Received fresh and placed in formalin at 10:10 a.m. is a left breast lumpectomy specimen. Size: 6.0 cm from superior to inferior x 5.5 cm from medial to lateral x 3.4 cm from anterior to posterior, also containing a 5.5 x 0.8 cm ellipse of tan skin on the anterior surface. Orientation: The specimen has been previously inked green anterior, blue inferior, orange lateral, yellow medial, black posterior and red superior. Localized area: A green pin is placed to indicate a radioactive seed and a yellow pin to indicate a biopsy clip. The radioactive seed is retrieved and sent to nuclear medicine. Cut surface: Sectioning reveals a 3.4 x 3.2 x 2.5 cm firm, pink-white, stellate lesion containing a ribbon shaped biopsy clip. The remaining cut surface is mostly yellow lobulated adipose tissue. Margins: The lesion lesion located 0.1 cm from the medial and superior margin, 0.3 cm from the anterior, 0.4 cm from the lateral, 0.5 cm from the posterior and is 1.0 cm from the inferior margin. Prognostic indicators: Not taken at time of gross. Representative sections are submitted for research. Block summary: A= lesion to superior margin. B= lesion with medial margin. C= lesion with anterior  margin D= lesion with lateral margin. E-F= representative lesion. G=  representative lesion with posterior margin. 2 of 3 FINAL for Wendy [name], Wendy [name] ([id number]) Gross(continued) H= inferior margin and uninvolved fibrous tissue. 8 blocks total. 2. Received fresh is a 3.0 x 2.0 x 1.0 cm aggregate of yellow lobulated adipose tissue sectioned to reveal two distinct lymph nodes which are separately submitted as parts 2 and 3. The first is a 0.6 x 0.6 x 0.3 cm ovoid pink tissue submitted entirely in block 2A. 3. Received in the specimen container with part 2 is a 1.7 x 1.0 x 0.8 cm ovoid blue tinged tissue with a pink-blue cut surface which is entirely submitted in two blocks. 4. Received fresh is a 3.7 x 2.5 x 0.4 cm portion of yellow lobulated adipose tissue inked yellow to designate the new medial margin. Sectioning reveals a focally firm white cut surface with blue tinged dye. The specimen is entirely submitted in six blocks A-F. (KF:gt, 12/09/15) Report signed out from the following location(s) Technical Component was performed at [facility]. [address],[place] [address].AOZH:[id number]., Interpretation was performed at [facility] [place], [place], [place] [address]. CLIA #: [id number], 3 of 3.  The patient is a 76 year old female.   Problem List/Past Medical Wendy [name], CMA; 12/11/2016 10:53 AM) BREAST CANCER, LEFT (V25.366)  Past Surgical History Wendy [name], CMA; 12/11/2016 10:52 AM) Breast Biopsy Left.  Diagnostic Studies History Wendy [name], CMA; 12/11/2016 10:52 AM) Colonoscopy 1-5 years ago Pap Smear 1-5 years ago  Allergies Wendy [name], CMA; 12/11/2016 10:52 AM) No Known Allergies 11/13/2016  Medication History Wendy [name], CMA; 12/11/2016 10:52 AM) AmLODIPine Besylate ('10MG'$  Tablet, Oral) Active. Basaglar KwikPen (100UNIT/ML Soln Pen-inj, Subcutaneous) Active. BD Insulin Syringe (30G X 1/2"0.5 ML Misc,) Active. BD Insulin Syr  Ultrafine II (31G X 5/16"0.5 ML Misc,) Active. Carvedilol ('25MG'$  Tablet, Oral) Active. CloNIDine HCl (0.'3MG'$  Tablet, Oral) Active. HydroCHLOROthiazide ('25MG'$  Tablet, Oral) Active. Lantus (100UNIT/ML Solution, Subcutaneous) Active. Lisinopril ('40MG'$  Tablet, Oral) Active. MetFORMIN HCl ('500MG'$  Tablet, Oral) Active. NovoLOG (100UNIT/ML Solution, Subcutaneous) Active. Rosuvastatin Calcium ('5MG'$  Tablet, Oral) Active. Medications Reconciled  Social History Wendy [name], CMA; 12/11/2016 10:53 AM) Caffeine use Carbonated beverages, Coffee. No alcohol use No drug use Tobacco use Never smoker.  Family History Wendy Lull R. Rolena Infante, CMA; 12/11/2016 10:53 AM) Arthritis Sister. Diabetes Mellitus Brother, Sister. Heart Disease Brother. Hypertension Brother, Sister.  Pregnancy / Birth History Wendy Lull R. Rolena Infante, CMA; 12/11/2016 10:53 AM) Age at menarche 16 years. Age of menopause 50-50 Gravida 66 Maternal age 78-25 Para 34  Other Problems Wendy [name], CMA; 12/11/2016 10:53 AM) Breast Cancer Diabetes Mellitus Gastroesophageal Reflux Disease High blood pressure Hypercholesterolemia    Vitals Wendy [name] CMA; 12/11/2016 10:52 AM) 12/11/2016 10:52 AM Weight: 177.13 lb Height: 62in Body Surface Area: 1.82 m Body Mass Index: 32.4 kg/m  BP: 132/88 (Sitting, Left Arm, Standard)      Physical Exam (Jong Rickman A. Suzzette Gasparro MD; 12/11/2016 11:13 AM)  Breast Note: JP drain removed from left breast. Incision intact and clean. No signs of infection or significant seroma.    Assessment & Plan (Loucinda Croy A. Hermine Feria MD; 12/11/2016 11:12 AM)  POST-OPERATIVE STATE [phone]) Impression: Patient requires reexcision lumpectomy on the left. This was discussed today. She has agreed to proceed. Risk of lumpectomy include bleeding, infection, seroma, more surgery, use of seed/wire, wound care, cosmetic deformity and the need for other treatments, death , blood  clots, death. Pt  agrees to proceed.

## 2016-12-28 NOTE — Discharge Instructions (Signed)
[facility] °Office Phone Number [phone] ° °BREAST BIOPSY/ PARTIAL MASTECTOMY: POST OP INSTRUCTIONS ° °Always review your discharge instruction sheet given to you by the facility where your surgery was performed. ° °IF YOU HAVE DISABILITY OR FAMILY LEAVE FORMS, YOU MUST BRING THEM TO THE OFFICE FOR PROCESSING.  DO NOT GIVE THEM TO YOUR DOCTOR. ° °1. A prescription for pain medication may be given to you upon discharge.  Take your pain medication as prescribed, if needed.  If narcotic pain medicine is not needed, then you may take acetaminophen (Tylenol) or ibuprofen (Advil) as needed. °2. Take your usually prescribed medications unless otherwise directed °3. If you need a refill on your pain medication, please contact your pharmacy.  They will contact our office to request authorization.  Prescriptions will not be filled after 5pm or on week-ends. °4. You should eat very light the first 24 hours after surgery, such as soup, crackers, pudding, etc.  Resume your normal diet the day after surgery. °5. Most patients will experience some swelling and bruising in the breast.  Ice packs and a good support bra will help.  Swelling and bruising can take several days to resolve.  °6. It is common to experience some constipation if taking pain medication after surgery.  Increasing fluid intake and taking a stool softener will usually help or prevent this problem from occurring.  A mild laxative (Milk of Magnesia or Miralax) should be taken according to package directions if there are no bowel movements after 48 hours. °7. Unless discharge instructions indicate otherwise, you may remove your bandages 24-48 hours after surgery, and you may shower at that time.  You may have steri-strips (small skin tapes) in place directly over the incision.  These strips should be left on the skin for 7-10 days.  If your surgeon used skin glue on the incision, you may shower in 24 hours.  The glue will flake off over the  next 2-3 weeks.  Any sutures or staples will be removed at the office during your follow-up visit. °8. ACTIVITIES:  You may resume regular daily activities (gradually increasing) beginning the next day.  Wearing a good support bra or sports bra minimizes pain and swelling.  You may have sexual intercourse when it is comfortable. °a. You may drive when you no longer are taking prescription pain medication, you can comfortably wear a seatbelt, and you can safely maneuver your car and apply brakes. °b. RETURN TO WORK:  ______________________________________________________________________________________ °9. You should see your doctor in the office for a follow-up appointment approximately two weeks after your surgery.  Your doctor’s nurse will typically make your follow-up appointment when she calls you with your pathology report.  Expect your pathology report 2-3 business days after your surgery.  You may call to check if you do not hear from us after three days. °10. OTHER INSTRUCTIONS: _______________________________________________________________________________________________ _____________________________________________________________________________________________________________________________________ °_____________________________________________________________________________________________________________________________________ °_____________________________________________________________________________________________________________________________________ ° °WHEN TO CALL YOUR DOCTOR: °1. Fever over 101.0 °2. Nausea and/or vomiting. °3. Extreme swelling or bruising. °4. Continued bleeding from incision. °5. Increased pain, redness, or drainage from the incision. ° °The clinic staff is available to answer your questions during regular business hours.  Please don’t hesitate to call and ask to speak to one of the nurses for clinical concerns.  If you have a medical emergency, go to the nearest  emergency room or call 911.  A surgeon from [facility] is always on call at the hospital. ° °For further questions, please visit [email or web address]  °

## 2016-12-29 ENCOUNTER — Encounter (HOSPITAL_COMMUNITY): Payer: Self-pay | Admitting: Surgery

## 2016-12-29 ENCOUNTER — Encounter (HOSPITAL_COMMUNITY): Payer: Self-pay

## 2017-01-01 ENCOUNTER — Telehealth: Payer: Self-pay | Admitting: Hematology

## 2017-01-01 NOTE — Telephone Encounter (Signed)
Spoke with patient re appointments for 3/28 @ 10:15 am w/YF and 12pm for [facility].

## 2017-01-02 ENCOUNTER — Encounter: Payer: Self-pay | Admitting: *Deleted

## 2017-01-02 ENCOUNTER — Telehealth: Payer: Self-pay | Admitting: Hematology

## 2017-01-02 NOTE — Telephone Encounter (Signed)
Due to [facility] moved 4/4 f/u to 2pm. Spoke with patient she is aware of seeing YF at 2 pm after 12 noon [facility] class.

## 2017-01-03 ENCOUNTER — Telehealth: Payer: Self-pay | Admitting: Hematology

## 2017-01-03 ENCOUNTER — Other Ambulatory Visit: Payer: Self-pay | Admitting: Student

## 2017-01-03 ENCOUNTER — Ambulatory Visit (HOSPITAL_BASED_OUTPATIENT_CLINIC_OR_DEPARTMENT_OTHER): Payer: Medicare Other | Admitting: Hematology

## 2017-01-03 ENCOUNTER — Other Ambulatory Visit: Payer: Medicare Other

## 2017-01-03 ENCOUNTER — Encounter: Payer: Self-pay | Admitting: *Deleted

## 2017-01-03 VITALS — BP 117/46 | HR 65 | Temp 97.8°F | Resp 17 | Ht 62.0 in | Wt 177.1 lb

## 2017-01-03 DIAGNOSIS — Z170 Estrogen receptor positive status [ER+]: Secondary | ICD-10-CM

## 2017-01-03 DIAGNOSIS — I10 Essential (primary) hypertension: Secondary | ICD-10-CM

## 2017-01-03 DIAGNOSIS — E119 Type 2 diabetes mellitus without complications: Secondary | ICD-10-CM | POA: Diagnosis not present

## 2017-01-03 DIAGNOSIS — C50412 Malignant neoplasm of upper-outer quadrant of left female breast: Secondary | ICD-10-CM | POA: Diagnosis not present

## 2017-01-03 NOTE — Telephone Encounter (Signed)
Appointments scheduled per 4.4.18 LOS. Patient given AVS report and calendars with future scheduled appointments. °

## 2017-01-04 ENCOUNTER — Other Ambulatory Visit: Payer: Self-pay | Admitting: Hematology

## 2017-01-04 ENCOUNTER — Ambulatory Visit (HOSPITAL_COMMUNITY)
Admission: RE | Admit: 2017-01-04 | Discharge: 2017-01-04 | Disposition: A | Discharge status: Home / Self Care | Payer: Medicare Other | Source: Ambulatory Visit | Attending: Hematology | Admitting: Hematology

## 2017-01-04 ENCOUNTER — Encounter (HOSPITAL_COMMUNITY): Payer: Self-pay

## 2017-01-04 DIAGNOSIS — E785 Hyperlipidemia, unspecified: Secondary | ICD-10-CM | POA: Insufficient documentation

## 2017-01-04 DIAGNOSIS — Z720 Tobacco use: Secondary | ICD-10-CM | POA: Insufficient documentation

## 2017-01-04 DIAGNOSIS — Z833 Family history of diabetes mellitus: Secondary | ICD-10-CM | POA: Insufficient documentation

## 2017-01-04 DIAGNOSIS — Z794 Long term (current) use of insulin: Secondary | ICD-10-CM | POA: Insufficient documentation

## 2017-01-04 DIAGNOSIS — I10 Essential (primary) hypertension: Secondary | ICD-10-CM | POA: Diagnosis not present

## 2017-01-04 DIAGNOSIS — C50412 Malignant neoplasm of upper-outer quadrant of left female breast: Secondary | ICD-10-CM | POA: Insufficient documentation

## 2017-01-04 DIAGNOSIS — Z170 Estrogen receptor positive status [ER+]: Principal | ICD-10-CM

## 2017-01-04 DIAGNOSIS — E119 Type 2 diabetes mellitus without complications: Secondary | ICD-10-CM | POA: Diagnosis not present

## 2017-01-04 DIAGNOSIS — K219 Gastro-esophageal reflux disease without esophagitis: Secondary | ICD-10-CM | POA: Diagnosis not present

## 2017-01-04 DIAGNOSIS — Z8249 Family history of ischemic heart disease and other diseases of the circulatory system: Secondary | ICD-10-CM | POA: Diagnosis not present

## 2017-01-04 DIAGNOSIS — Z452 Encounter for adjustment and management of vascular access device: Secondary | ICD-10-CM | POA: Diagnosis not present

## 2017-01-04 HISTORY — PX: IR US GUIDE VASC ACCESS RIGHT: IMG2390

## 2017-01-04 HISTORY — PX: IR FLUORO GUIDE PORT INSERTION RIGHT: IMG5741

## 2017-01-04 LAB — PROTIME-INR
INR: 0.91
Prothrombin Time: 12.3 seconds (ref 11.4–15.2)

## 2017-01-04 LAB — CBC
HEMATOCRIT: 30.9 % — AB (ref 36.0–46.0)
HEMOGLOBIN: 10.9 g/dL — AB (ref 12.0–15.0)
MCH: 27.5 pg (ref 26.0–34.0)
MCHC: 35.3 g/dL (ref 30.0–36.0)
MCV: 77.8 fL — ABNORMAL LOW (ref 78.0–100.0)
Platelets: 264 10*3/uL (ref 150–400)
RBC: 3.97 MIL/uL (ref 3.87–5.11)
RDW: 14.8 % (ref 11.5–15.5)
WBC: 6.9 10*3/uL (ref 4.0–10.5)

## 2017-01-04 LAB — GLUCOSE, CAPILLARY
GLUCOSE-CAPILLARY: 141 mg/dL — AB (ref 65–99)
Glucose-Capillary: 123 mg/dL — ABNORMAL HIGH (ref 65–99)

## 2017-01-04 LAB — APTT: aPTT: 31 seconds (ref 24–36)

## 2017-01-04 MED ORDER — HEPARIN SOD (PORK) LOCK FLUSH 100 UNIT/ML IV SOLN
INTRAVENOUS | Status: AC | PRN
  Administered 2017-01-04: 500 [IU] via INTRAVENOUS

## 2017-01-04 MED ORDER — CEFAZOLIN SODIUM-DEXTROSE 2-4 GM/100ML-% IV SOLN
2.0000 g | INTRAVENOUS | Status: AC
  Administered 2017-01-04: 2 g via INTRAVENOUS

## 2017-01-04 MED ORDER — MIDAZOLAM HCL 2 MG/2ML IJ SOLN
INTRAMUSCULAR | Status: AC | PRN
  Administered 2017-01-04 (×2): 1 mg via INTRAVENOUS

## 2017-01-04 MED ORDER — LIDOCAINE HCL 1 % IJ SOLN
INTRAMUSCULAR | Status: AC | PRN
  Administered 2017-01-04: 10 mL via INTRADERMAL

## 2017-01-04 MED ORDER — MIDAZOLAM HCL 2 MG/2ML IJ SOLN
INTRAMUSCULAR | Status: AC
  Filled 2017-01-04: qty 2

## 2017-01-04 MED ORDER — LIDOCAINE HCL 1 % IJ SOLN
INTRAMUSCULAR | Status: AC
  Filled 2017-01-04: qty 20

## 2017-01-04 MED ORDER — HEPARIN SOD (PORK) LOCK FLUSH 100 UNIT/ML IV SOLN
INTRAVENOUS | Status: AC
  Filled 2017-01-04: qty 5

## 2017-01-04 MED ORDER — CEFAZOLIN SODIUM-DEXTROSE 2-4 GM/100ML-% IV SOLN
INTRAVENOUS | Status: AC
  Filled 2017-01-04: qty 100

## 2017-01-04 MED ORDER — SODIUM CHLORIDE 0.9 % IV SOLN
INTRAVENOUS | Status: DC
  Administered 2017-01-04: 12:00:00 via INTRAVENOUS

## 2017-01-04 MED ORDER — LIDOCAINE-EPINEPHRINE (PF) 2 %-1:200000 IJ SOLN
INTRAMUSCULAR | Status: AC
  Filled 2017-01-04: qty 20

## 2017-01-04 MED ORDER — FENTANYL CITRATE (PF) 100 MCG/2ML IJ SOLN
INTRAMUSCULAR | Status: AC | PRN
  Administered 2017-01-04: 50 ug via INTRAVENOUS
  Administered 2017-01-04: 25 ug via INTRAVENOUS

## 2017-01-04 MED ORDER — FENTANYL CITRATE (PF) 100 MCG/2ML IJ SOLN
INTRAMUSCULAR | Status: AC
  Filled 2017-01-04: qty 2

## 2017-01-04 NOTE — Discharge Instructions (Signed)
Moderate Conscious Sedation, Adult, Care After °These instructions provide you with information about caring for yourself after your procedure. Your health care provider may also give you more specific instructions. Your treatment has been planned according to current medical practices, but problems sometimes occur. Call your health care provider if you have any problems or questions after your procedure. °What can I expect after the procedure? °After your procedure, it is common: °· To feel sleepy for several hours. °· To feel clumsy and have poor balance for several hours. °· To have poor judgment for several hours. °· To vomit if you eat too soon. °Follow these instructions at home: °For at least 24 hours after the procedure:  ° °· Do not: °¨ Participate in activities where you could fall or become injured. °¨ Drive. °¨ Use heavy machinery. °¨ Drink alcohol. °¨ Take sleeping pills or medicines that cause drowsiness. °¨ Make important decisions or sign legal documents. °¨ Take care of children on your own. °· Rest. °Eating and drinking  °· Follow the diet recommended by your health care provider. °· If you vomit: °¨ Drink water, juice, or soup when you can drink without vomiting. °¨ Make sure you have little or no nausea before eating solid foods. °General instructions  °· Have a responsible adult stay with you until you are awake and alert. °· Take over-the-counter and prescription medicines only as told by your health care provider. °· If you smoke, do not smoke without supervision. °· Keep all follow-up visits as told by your health care provider. This is important. °Contact a health care provider if: °· You keep feeling nauseous or you keep vomiting. °· You feel light-headed. °· You develop a rash. °· You have a fever. °Get help right away if: °· You have trouble breathing. °This information is not intended to replace advice given to you by your health care provider. Make sure you discuss any questions you  have with your health care provider. °Document Released: 07/09/2013 Document Revised: 02/21/2016 Document Reviewed: 01/08/2016 °Elsevier Interactive Patient Education © 2017 Elsevier Inc. ° ° °Implanted Port Home Guide °An implanted port is a type of central line that is placed under the skin. Central lines are used to provide IV access when treatment or nutrition needs to be given through a person’s veins. Implanted ports are used for long-term IV access. An implanted port may be placed because: °· You need IV medicine that would be irritating to the small veins in your hands or arms. °· You need long-term IV medicines, such as antibiotics. °· You need IV nutrition for a long period. °· You need frequent blood draws for lab tests. °· You need dialysis. °Implanted ports are usually placed in the chest area, but they can also be placed in the upper arm, the abdomen, or the leg. An implanted port has two main parts: °· Reservoir. The reservoir is round and will appear as a small, raised area under your skin. The reservoir is the part where a needle is inserted to give medicines or draw blood. °· Catheter. The catheter is a thin, flexible tube that extends from the reservoir. The catheter is placed into a large vein. Medicine that is inserted into the reservoir goes into the catheter and then into the vein. °How will I care for my incision site? °Do not get the incision site wet. Bathe or shower as directed by your health care provider. °How is my port accessed? °Special steps must be taken to access the   port: °· Before the port is accessed, a numbing cream can be placed on the skin. This helps numb the skin over the port site. °· Your health care provider uses a sterile technique to access the port. °¨ Your health care provider must put on a mask and sterile gloves. °¨ The skin over your port is cleaned carefully with an antiseptic and allowed to dry. °¨ The port is gently pinched between sterile gloves, and a needle  is inserted into the port. °· Only "non-coring" port needles should be used to access the port. Once the port is accessed, a blood return should be checked. This helps ensure that the port is in the vein and is not clogged. °· If your port needs to remain accessed for a constant infusion, a clear (transparent) bandage will be placed over the needle site. The bandage and needle will need to be changed every week, or as directed by your health care provider. °· Keep the bandage covering the needle clean and dry. Do not get it wet. Follow your health care provider’s instructions on how to take a shower or bath while the port is accessed. °· If your port does not need to stay accessed, no bandage is needed over the port. °What is flushing? °Flushing helps keep the port from getting clogged. Follow your health care provider’s instructions on how and when to flush the port. Ports are usually flushed with saline solution or a medicine called heparin. The need for flushing will depend on how the port is used. °· If the port is used for intermittent medicines or blood draws, the port will need to be flushed: °¨ After medicines have been given. °¨ After blood has been drawn. °¨ As part of routine maintenance. °· If a constant infusion is running, the port may not need to be flushed. °How long will my port stay implanted? °The port can stay in for as long as your health care provider thinks it is needed. When it is time for the port to come out, surgery will be done to remove it. The procedure is similar to the one performed when the port was put in. °When should I seek immediate medical care? °When you have an implanted port, you should seek immediate medical care if: °· You notice a bad smell coming from the incision site. °· You have swelling, redness, or drainage at the incision site. °· You have more swelling or pain at the port site or the surrounding area. °· You have a fever that is not controlled with medicine. °This  information is not intended to replace advice given to you by your health care provider. Make sure you discuss any questions you have with your health care provider. °Document Released: 09/18/2005 Document Revised: 02/24/2016 Document Reviewed: 05/26/2013 °Elsevier Interactive Patient Education © 2017 Elsevier Inc. ° ° °Implanted Port Insertion, Care After °This sheet gives you information about how to care for yourself after your procedure. Your health care provider may also give you more specific instructions. If you have problems or questions, contact your health care provider. °What can I expect after the procedure? °After your procedure, it is common to have: °· Discomfort at the port insertion site. °· Bruising on the skin over the port. This should improve over 3-4 days. °Follow these instructions at home: °Port care  °· After your port is placed, you will get a manufacturer's information card. The card has information about your port. Keep this card with you at all   times. °· Take care of the port as told by your health care provider. Ask your health care provider if you or a family member can get training for taking care of the port at home. A home health care nurse may also take care of the port. °· Make sure to remember what type of port you have. °Incision care  °· Follow instructions from your health care provider about how to take care of your port insertion site. Make sure you: °¨ Wash your hands with soap and water before you change your bandage (dressing). If soap and water are not available, use hand sanitizer. °¨ Change your dressing as told by your health care provider. °¨ Leave stitches (sutures), skin glue, or adhesive strips in place. These skin closures may need to stay in place for 2 weeks or longer. If adhesive strip edges start to loosen and curl up, you may trim the loose edges. Do not remove adhesive strips completely unless your health care provider tells you to do that. °· Check your  port insertion site every day for signs of infection. Check for: °¨ More redness, swelling, or pain. °¨ More fluid or blood. °¨ Warmth. °¨ Pus or a bad smell. °General instructions  °· Do not take baths, swim, or use a hot tub until your health care provider approves. °· Do not lift anything that is heavier than 10 lb (4.5 kg) for a week, or as told by your health care provider. °· Ask your health care provider when it is okay to: °¨ Return to work or school. °¨ Resume usual physical activities or sports. °· Do not drive for 24 hours if you were given a medicine to help you relax (sedative). °· Take over-the-counter and prescription medicines only as told by your health care provider. °· Wear a medical alert bracelet in case of an emergency. This will tell any health care providers that you have a port. °· Keep all follow-up visits as told by your health care provider. This is important. °Contact a health care provider if: °· You cannot flush your port with saline as directed, or you cannot draw blood from the port. °· You have a fever or chills. °· You have more redness, swelling, or pain around your port insertion site. °· You have more fluid or blood coming from your port insertion site. °· Your port insertion site feels warm to the touch. °· You have pus or a bad smell coming from the port insertion site. °Get help right away if: °· You have chest pain or shortness of breath. °· You have bleeding from your port that you cannot control. °Summary °· Take care of the port as told by your health care provider. °· Change your dressing as told by your health care provider. °· Keep all follow-up visits as told by your health care provider. °This information is not intended to replace advice given to you by your health care provider. Make sure you discuss any questions you have with your health care provider. °Document Released: 07/09/2013 Document Revised: 08/09/2016 Document Reviewed: 08/09/2016 °Elsevier Interactive  Patient Education © 2017 Elsevier Inc. ° °

## 2017-01-04 NOTE — H&P (Signed)
Chief Complaint: breast cancer  Referring Physician:Dr. [name]  Supervising Physician: Markus Daft  Patient Status: [facility] - Out-pt  HPI: Wendy [name] is an 76 y.o. female who was recently diagnosed with  cancer of the left breast.  She underwent a lumpectomy and then a re-excision for positive margins.  The re-excision was negative according to the family.  She is going to start chemotherapy in 2 weeks.  She presents today for [facility] placement.  Past Medical History:  Past Medical History:  Diagnosis Date  . Breast cancer ([place]) 11/2016   left  . GERD (gastroesophageal reflux disease)    TUMS as needed  . HTN (hypertension)    states BP has been high recently; has been on med. x 20 yr.  . Hyperlipidemia   . Insulin dependent diabetes mellitus ([place])     Past Surgical History:  Past Surgical History:  Procedure Laterality Date  . BREAST LUMPECTOMY Left 05/05/2008  . BREAST LUMPECTOMY WITH RADIOACTIVE SEED AND SENTINEL LYMPH NODE BIOPSY Left 12/07/2016   Procedure: LEFT BREAST LUMPECTOMY WITH RADIOACTIVE SEED AND SENTINEL LYMPH NODE BIOPSY;  Surgeon: Erroll Luna, MD;  Location: [place];  Service: General;  Laterality: Left;  . RE-EXCISION OF BREAST LUMPECTOMY Left 12/28/2016   Procedure: RE-EXCISION OF BREAST LUMPECTOMY;  Surgeon: Erroll Luna, MD;  Location: [facility] OR;  Service: General;  Laterality: Left;    Family History:  Family History  Problem Relation Age of Onset  . Diabetes Sister     x 4  . Diabetes Brother   . Diabetes Paternal Grandmother   . Diabetes Paternal Aunt   . Heart disease Brother   . Heart disease Sister   . Emphysema Brother   . Cancer Father 36    brain tumor     Social History:  reports that she has never smoked. Her smokeless tobacco use includes Snuff. She reports that she does not drink alcohol or use drugs.  Allergies: No Known Allergies  Medications: Medications reviewed in epic  Please HPI for pertinent positives,  otherwise complete 10 system ROS negative.  Mallampati Score: MD Evaluation Airway: WNL Heart: WNL Abdomen: WNL Chest/ Lungs: WNL ASA  Classification: 3 Mallampati/Airway Score: Two  Physical Exam: BP (!) 128/55   Pulse 67   Temp 98 F (36.7 C) (Oral)   Resp 16   Ht 5\' 2"  (1.575 m)   Wt 179 lb (81.2 kg)   SpO2 100%   BMI 32.74 kg/m  Body mass index is 32.74 kg/m. General: pleasant, black female who is laying in bed in NAD HEENT: head is normocephalic, atraumatic.  Sclera are noninjected.  PERRL.  Ears and nose without any masses or lesions.  Mouth is pink and moist Heart: regular, rate, and rhythm.  Normal s1,s2. No obvious murmurs, gallops, or rubs noted.  Palpable radial and pedal pulses bilaterally Lungs: CTAB, no wheezes, rhonchi, or rales noted.  Respiratory effort nonlabored Abd: soft, NT, ND, +BS, no masses, hernias, or organomegaly Psych: A&Ox3 with an appropriate affect.   Labs: Results for orders placed or performed during the hospital encounter of 01/04/17 (from the past 48 hour(s))  APTT upon arrival     Status: None   Collection Time: 01/04/17 12:09 PM  Result Value Ref Range   aPTT 31 24 - 36 seconds  CBC upon arrival     Status: Abnormal   Collection Time: 01/04/17 12:09 PM  Result Value Ref Range   WBC 6.9 4.0 -  10.5 K/uL   RBC 3.97 3.87 - 5.11 MIL/uL   Hemoglobin 10.9 (L) 12.0 - 15.0 g/dL   HCT 30.9 (L) 36.0 - 46.0 %   MCV 77.8 (L) 78.0 - 100.0 fL   MCH 27.5 26.0 - 34.0 pg   MCHC 35.3 30.0 - 36.0 g/dL   RDW 14.8 11.5 - 15.5 %   Platelets 264 150 - 400 K/uL  Protime-INR upon arrival     Status: None   Collection Time: 01/04/17 12:09 PM  Result Value Ref Range   Prothrombin Time 12.3 11.4 - 15.2 seconds   INR 0.91     Imaging: No results found.  Assessment/Plan 1. Carcinoma of the left breast We will plan to proceed today with PAC placement.  Her labs and vitals have been reviewed Risks and Benefits discussed with the patient including, but  not limited to bleeding, infection, pneumothorax, or fibrin sheath development and need for additional procedures. All of the patient's questions were answered, patient is agreeable to proceed. Consent signed and in chart.   Thank you for this interesting consult.  I greatly enjoyed meeting Wendy [name] and look forward to participating in their care.  A copy of this report was sent to the requesting provider on this date.  Electronically Signed: Henreitta Cea 01/04/2017, 1:21 PM   I spent a total of  30 Minutes   in face to face in clinical consultation, greater than 50% of which was counseling/coordinating care for breast cancer left

## 2017-01-04 NOTE — Procedures (Signed)
Placement of right jugular portacath.  Tip at SVC/RA junction.  Minimal blood loss and no immediate complication.   

## 2017-01-06 ENCOUNTER — Encounter: Payer: Self-pay | Admitting: Hematology

## 2017-01-08 ENCOUNTER — Encounter: Payer: Self-pay | Admitting: Internal Medicine

## 2017-01-08 ENCOUNTER — Ambulatory Visit (INDEPENDENT_AMBULATORY_CARE_PROVIDER_SITE_OTHER): Payer: Medicare Other | Admitting: Internal Medicine

## 2017-01-08 VITALS — BP 124/78 | HR 71 | Ht 61.5 in | Wt 179.0 lb

## 2017-01-08 DIAGNOSIS — Z794 Long term (current) use of insulin: Secondary | ICD-10-CM | POA: Diagnosis not present

## 2017-01-08 DIAGNOSIS — E113419 Type 2 diabetes mellitus with severe nonproliferative diabetic retinopathy with macular edema, unspecified eye: Secondary | ICD-10-CM | POA: Diagnosis not present

## 2017-01-08 DIAGNOSIS — E1165 Type 2 diabetes mellitus with hyperglycemia: Secondary | ICD-10-CM | POA: Diagnosis not present

## 2017-01-08 LAB — POCT GLYCOSYLATED HEMOGLOBIN (HGB A1C): Hemoglobin A1C: 8.2

## 2017-01-08 NOTE — Addendum Note (Signed)
Addended by: Caprice Beaver T on: 01/08/2017 02:26 PM   Modules accepted: Orders

## 2017-01-08 NOTE — Progress Notes (Addendum)
Patient ID: Wendy [name], female   DOB: [birth year], 76 y.o.   MRN: [id number]  HPI: Wendy [name] is a 75 y.o.-year-old female, returning for f/u for DM2, dx in 32s, insulin-dependent since 1990, uncontrolled, with complications (CKD, DR). Last visit 7 mo ago.  She had L breast lumpectomy (BrCA) >> will start ChTx.  Last hemoglobin A1c was: Lab Results  Component Value Date   HGBA1C 9.4 06/07/2016   HGBA1C 8.6 02/09/2016   HGBA1C 8.2 (H) 10/20/2015   Pt is on: - Metformin (1000 mg) in am (! - she did not move it at night!) - Basaglar (20-)24 units in am (!) - Novolog: - 6 units before a smaller meal (only taking this!!!) - 8 units before a regular meal - 10 units before a large meal or if you have dessert   Pt checks her sugars 1-2 a day: - am: 173-240 (eats snack at night) >> 149-240  (eats snack at night) >> 114, 189-236 - 2h after b'fast: 149, 210-261, 318 >> n/c >> 116-270 - before lunch: 160, 229 >> 40 x1, 60 x1 (delayed lunch), 112, 154 >> 52, 231, 250 >> 70-215 - 2h after lunch: 154-237 >> 200 >> n/c >> 200, 203 >> n/c - before dinner: 171-277 >> 249, 254, 400 >> n/c >> 194 >> 209 - 2h after dinner: 284 >> 143 >> n/c >> 174, 302 >> 120-174 - bedtime: 189-254 >> 103 >> 60x1 >> 200 >> n/c  - nighttime: 185-265 >> 32!x1, 117x2 >> 132 >> 244-290 >> n/c No lows. Lowest sugar was 32 >> 40 (delayed meal) >> 52 x1 >> 70; she has hypoglycemia awareness at 60.  Highest sugar was 342 >> 400x1 >> 200s >> 302 >> 270  Glucometer: [employer] Voice  Pt's meals are: - Breakfast: toast, egg, bacon/sausage; oatmeal - Lunch: [place] sandwich, soups, crackers - Dinner: baked chicken, potatoes, cabbage (dinner is her largest meal) - Snacks: PB and crackers  - She does have a history of CKD, last BUN/creatinine:  Lab Results  Component Value Date   BUN 15 12/28/2016   CREATININE 1.23 (H) 12/28/2016  She continues on Lisinopril. - last set of lipids: Lab Results  Component Value Date   CHOL 165 10/20/2015   HDL 50.20 10/20/2015   LDLCALC 92 10/20/2015   LDLDIRECT 91 10/25/2012   TRIG 112.0 10/20/2015   CHOLHDL 3 10/20/2015  On Crestor 5 >> had to stop 2/2 mm pain - last eye exam was on 05/2016. 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 She had a Laser tx. 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  She is a caregiver for her husband, who is in a wheelchair.   ROS: Constitutional: no weight gain/loss, no fatigue, no subjective hyperthermia/hypothermia, + nocturia Eyes: no blurry vision, no xerophthalmia ENT: no sore throat, no nodules palpated in throat, no dysphagia/odynophagia, no hoarseness Cardiovascular: no CP/SOB/palpitations/leg swelling Respiratory: no cough/SOB Gastrointestinal: no N/V/D/+ C Musculoskeletal: no muscle/joint aches Skin: no rashes Neurological: no tremors/numbness/tingling/dizziness  I reviewed pt's medications, allergies, PMH, social hx, family hx, and changes were documented in the history of present illness. Otherwise, unchanged from my initial visit note.  Past Medical History:  Diagnosis Date  . Breast cancer ([place]) 11/2016   left  . GERD (gastroesophageal reflux disease)    TUMS as needed  . HTN (hypertension)    states BP has been high recently; has been on med. x 20 yr.  . Hyperlipidemia   . Insulin dependent  diabetes mellitus ([place])    Past Surgical History:  Procedure Laterality Date  . BREAST LUMPECTOMY Left 05/05/2008  . BREAST LUMPECTOMY WITH RADIOACTIVE SEED AND SENTINEL LYMPH NODE BIOPSY Left 12/07/2016   Procedure: LEFT BREAST LUMPECTOMY WITH RADIOACTIVE SEED AND SENTINEL LYMPH NODE BIOPSY;  Surgeon: Erroll Luna, MD;  Location: [place];  Service: General;  Laterality: Left;  . IR FLUORO GUIDE PORT INSERTION RIGHT  01/04/2017  . IR US GUIDE VASC ACCESS RIGHT  01/04/2017  . RE-EXCISION OF BREAST LUMPECTOMY Left 12/28/2016   Procedure: RE-EXCISION OF BREAST LUMPECTOMY;  Surgeon: Erroll Luna, MD;   [facility] OR;  Service: General;  Laterality: Left;   Social History   Social History  . Marital Status: Married    Spouse Name: N/A  . Number of Children: 4   . retired    Social History Main Topics  . Smoking status: Never Smoker   . Smokeless tobacco: Former Systems developer    Types: Snuff    Quit date: 10/02/2014     Comment: Quit 10/02/2014  previous 1 box per week of snuff   . Alcohol Use: No  . Drug Use: No   Current Outpatient Prescriptions on File Prior to Visit  Medication Sig Dispense Refill  . amLODipine (NORVASC) 10 MG tablet TAKE 1 TABLET (10 MG TOTAL) BY MOUTH DAILY. 30 tablet 5  . aspirin 81 MG tablet Take 81 mg by mouth daily.      . B-D INS SYRINGE 0.5CC/31GX5/16 31G X 5/16" 0.5 ML MISC USE AS DIRECTED 4 TIMES A DAY 300 each 1  . carvedilol (COREG) 25 MG tablet TAKE 1 TABLET BY MOUTH TWICE A DAY WITH A MEAL 180 tablet 1  . cloNIDine (CATAPRES) 0.3 MG tablet Take 0.3 mg by mouth 2 (two) times daily.    . hydrochlorothiazide (HYDRODIURIL) 25 MG tablet Take 1 tablet (25 mg total) by mouth daily. 30 tablet 5  . HYDROcodone-acetaminophen (NORCO/VICODIN) 5-325 MG tablet Take 1-2 tablets by mouth every 6 (six) hours as needed for moderate pain. 20 tablet 0  . insulin aspart (NOVOLOG) 100 UNIT/ML injection Inject 5-6 Units into the skin 2 (two) times daily with a meal.     . Insulin Glargine (BASAGLAR KWIKPEN) 100 UNIT/ML SOPN Inject 24 Units into the skin every morning.   3  . Insulin Pen Needle 31G X 5 MM MISC Use pen needles for insulin injection daily 100 each 3  . Insulin Syringe-Needle U-100 (B-D INS SYRINGE 0.5CC/30GX1/2") 30G X 1/2" 0.5 ML MISC USE AS DIRECTED 4 TIMES A DAY 300 each 3  . lisinopril (PRINIVIL,ZESTRIL) 40 MG tablet TAKE 1 TABLET (40 MG TOTAL) BY MOUTH DAILY. 30 tablet 5  . metFORMIN (GLUCOPHAGE) 500 MG tablet Take 500 mg by mouth 2 (two) times daily with a meal.    . rosuvastatin (CRESTOR) 5 MG tablet Take 1 tablet (5 mg total) by mouth daily. -- need labs  for further refills 90 tablet 0   No current facility-administered medications on file prior to visit.    No Known Allergies Family History  Problem Relation Age of Onset  . Diabetes Sister     x 4  . Diabetes Brother   . Diabetes Paternal Grandmother   . Diabetes Paternal Aunt   . Heart disease Brother   . Heart disease Sister   . Emphysema Brother   . Cancer Father 47    brain tumor    PE: BP 124/78 (BP Location: Left Arm, Patient  Position: Sitting)   Pulse 71   Ht 5' 1.5" (1.562 m)   Wt 179 lb (81.2 kg)   SpO2 98%   BMI 33.27 kg/m  Body mass index is 33.27 kg/m. Wt Readings from Last 3 Encounters:  01/08/17 179 lb (81.2 kg)  01/04/17 179 lb (81.2 kg)  01/03/17 177 lb 1.6 oz (80.3 kg)   Constitutional: overweight, in NAD Eyes: PERRLA, EOMI, no exophthalmos ENT: moist mucous membranes, no thyromegaly, no cervical lymphadenopathy Cardiovascular: RRR, No MRG Respiratory: CTA B Gastrointestinal: abdomen soft, NT, ND, BS+ Musculoskeletal: no deformities, strength intact in all 4 Skin: moist, warm, no rashes Neurological: no tremor with outstretched hands, DTR normal in all 4  ASSESSMENT: 1. DM2, insulin-dependent, uncontrolled, with complications - + DR (severe, non-proliferative)  PLAN:  1.  Patient with long-standing uncontrolled diabetes, on basal-bolus insulin regimen and also metformin, with slightly better sugars at this visit, but they are still high, as she did not follow my suggestions from last visit. At that point, I suggested to move the metformin at night and increase the NovoLog. I again advised her to start doing this now. - I suggested to:  Patient Instructions  Please change - Metformin (1000 mg) - move this with dinner  Keep: - Basaglar at 24 units in am  Please increase: - Novolog: - 6 units before a smaller meal - 8 units before a regular meal - 10 units before a large meal or if you have dessert  Try to skip the snack at night, if  sugars >100.  Please return in 3 months with your sugar log.   - Continue checking sugars 3 times a day, rotating check times. - given more logs - advised for yearly eye exams  >> she is UTD - she has severe diabetic retinopathy - checked HbA1c today >> 8.2% (better) - Return to clinic in 3 mo with sugar log   Philemon Kingdom, MD PhD [facility] Endocrinology

## 2017-01-08 NOTE — Patient Instructions (Addendum)
Please change - Metformin (1000 mg) - move this with dinner  Keep: - Basaglar at 24 units in am  Please increase: - Novolog: - 6 units before a smaller meal - 8 units before a regular meal - 10 units before a large meal or if you have dessert  Try to skip the snack at night, if sugars >100.  Please return in 3 months with your sugar log.

## 2017-01-09 ENCOUNTER — Other Ambulatory Visit: Payer: Self-pay | Admitting: Hematology

## 2017-01-09 DIAGNOSIS — Z170 Estrogen receptor positive status [ER+]: Principal | ICD-10-CM

## 2017-01-09 DIAGNOSIS — C50412 Malignant neoplasm of upper-outer quadrant of left female breast: Secondary | ICD-10-CM

## 2017-01-09 MED ORDER — DEXAMETHASONE 4 MG PO TABS: 8.0000 mg | ORAL_TABLET | Freq: Two times a day (BID) | ORAL | 1 refills | Status: DC

## 2017-01-09 MED ORDER — PROCHLORPERAZINE MALEATE 10 MG PO TABS: 10.0000 mg | ORAL_TABLET | Freq: Four times a day (QID) | ORAL | 1 refills | Status: DC | PRN

## 2017-01-09 MED ORDER — ONDANSETRON HCL 8 MG PO TABS: 8.0000 mg | ORAL_TABLET | Freq: Two times a day (BID) | ORAL | 1 refills | Status: DC | PRN

## 2017-01-09 MED ORDER — LIDOCAINE-PRILOCAINE 2.5-2.5 % EX CREA: TOPICAL_CREAM | CUTANEOUS | 3 refills | Status: DC

## 2017-01-09 NOTE — Progress Notes (Signed)
START ON PATHWAY REGIMEN - Breast     A cycle is every 21 days:     Docetaxel      Cyclophosphamide   **Always confirm dose/schedule in your pharmacy ordering system**    Patient Characteristics: Postoperative without Neoadjuvant Therapy (Pathologic Staging), Invasive Disease, Adjuvant Therapy, Node Positive (1-3), HER2 Negative/Unknown/Equivocal, ER Positive, MammaPrint(R) Ordered, High Genomic Risk Therapeutic Status: Postoperative without Neoadjuvant Therapy (Pathologic Staging) AJCC M Category: pM0 AJCC N Category: pN1a AJCC 8 Stage Grouping: IIB ER Status: Positive (+) HER2 Status: Negative (-) Oncotype Dx Recurrence Score: 50 AJCC T Category: T2 AJCC Grade: G3 PR Status: Negative (-) Has this patient completed genomic testing? Yes - MammaPrint(R) MammaPrint(R) Score: High Genomic Risk  Intent of Therapy: Curative Intent, Discussed with Patient

## 2017-01-10 ENCOUNTER — Encounter: Payer: Self-pay | Admitting: Hematology

## 2017-01-10 NOTE — Progress Notes (Signed)
Received PA request for Lidocaine-Prilocaine cream. Submitted via Cover My Meds online.   Philippa Chester (KeyBrunetta Jeans)   This request has received a Favorable outcome. Please note any additional information provided by [employer] Part [employer] at the bottom of this request. Philippa Chester Key: [email or web address] - PA Case ID: [id number] - Rx #: [id number] Need help? Call us at [phone]  Outcome  Approvedtoday  Your request has been approved Called [facility](Shanique) to advise of the approval. She states it went through.

## 2017-01-12 ENCOUNTER — Telehealth: Payer: Self-pay | Admitting: *Deleted

## 2017-01-12 ENCOUNTER — Encounter: Payer: Self-pay | Admitting: Hematology

## 2017-01-12 NOTE — Telephone Encounter (Signed)
Called pt to clarify chemo class on 4/4. Discussed with pt that was chemotherapy class and not radiation class.  Informed pt her prescription for emla cream was sent to her pharmacy. Encourage pt to call with needs. Received verbal understanding. Contact information provided.

## 2017-01-12 NOTE — Progress Notes (Signed)
Submitted auth request for Ondansetron today.  It was approved from 12/31/16 - 01/12/18.

## 2017-01-16 ENCOUNTER — Other Ambulatory Visit: Payer: Medicare Other

## 2017-01-17 ENCOUNTER — Ambulatory Visit (INDEPENDENT_AMBULATORY_CARE_PROVIDER_SITE_OTHER): Payer: Medicare Other | Admitting: Internal Medicine

## 2017-01-17 ENCOUNTER — Encounter: Payer: Self-pay | Admitting: Internal Medicine

## 2017-01-17 ENCOUNTER — Encounter: Payer: Self-pay | Admitting: Pharmacist

## 2017-01-17 VITALS — BP 136/82 | HR 71 | Temp 97.7°F | Resp 16 | Wt 178.0 lb

## 2017-01-17 DIAGNOSIS — E1165 Type 2 diabetes mellitus with hyperglycemia: Secondary | ICD-10-CM

## 2017-01-17 DIAGNOSIS — K5900 Constipation, unspecified: Secondary | ICD-10-CM

## 2017-01-17 DIAGNOSIS — K625 Hemorrhage of anus and rectum: Secondary | ICD-10-CM | POA: Diagnosis not present

## 2017-01-17 DIAGNOSIS — I10 Essential (primary) hypertension: Secondary | ICD-10-CM | POA: Diagnosis not present

## 2017-01-17 DIAGNOSIS — E113419 Type 2 diabetes mellitus with severe nonproliferative diabetic retinopathy with macular edema, unspecified eye: Secondary | ICD-10-CM | POA: Diagnosis not present

## 2017-01-17 DIAGNOSIS — Z794 Long term (current) use of insulin: Secondary | ICD-10-CM | POA: Diagnosis not present

## 2017-01-17 NOTE — Progress Notes (Signed)
Subjective:    Patient ID: Wendy [name], female    DOB: [birth year], 76 y.o.   MRN: [id number]  HPI The patient is here for follow up.  Left-sided breast cancer:  She was diagnosed [birth year]. She had a left lumpectomy and left axillary sentinel lymph node sampling on 12/17/16.  She had a reexcision of previously positive superior margin on 12/28/16.  She saw oncology earlier this month was advised monitoring chemotherapy with 4-6 cycles and adjuvant breast radiation. Is also recommended she get an aromatase inhibitor after completing radiation. Overall she feels well.  She denies anxiety or depression. She has minimal intermittent pain and takes the pain medication as needed.  She will start chemo tomorrow.    Diabetes: She is following with Dr. Cruzita Lederer.  Her medication was recently adjusted.  She is compliant with a diabetic diet.  She is active at home, but not exercising regularly.   Hypertension: She is taking her medication daily. She is compliant with a low sodium diet.  She denies chest pain, palpitations, edema, shortness of breath and regular headaches. She is not exercising regularly.      Constipation, rectal bleeding:  She has been constipated for a long time, but it has gotten worse recently since being on narcotic pain medication.  She just started taking stool softener daily and it has helped.  She saw blood on the tissue when she wiped last week and this week.  She denies abdominal pain but has lower abdominal pressure at times when she needs to have a bowel movement.  She denies rectal pain.     Medications and allergies reviewed with patient and updated if appropriate.  Patient Active Problem List   Diagnosis Date Noted  . Malignant neoplasm of upper-outer quadrant of left breast in female, estrogen receptor positive ([place]) 11/20/2016  . Left upper arm pain 11/03/2016  . CKD (chronic kidney disease) stage 3, GFR 30-59 ml/min 03/17/2016  . Osteopenia 01/05/2016  . 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 Seasonal allergies 12/01/2013  . Chronic diastolic heart failure ([place]) 07/06/2011  . TOBACCO USE 04/13/2008  . Hyperlipidemia 11/29/2006  . OBESITY, BMI 30-35 11/29/2006  . HYPERTENSION, BENIGN SYSTEMIC 11/29/2006    Current Outpatient Prescriptions on File Prior to Visit  Medication Sig Dispense Refill  . amLODipine (NORVASC) 10 MG tablet TAKE 1 TABLET (10 MG TOTAL) BY MOUTH DAILY. 30 tablet 5  . aspirin 81 MG tablet Take 81 mg by mouth daily.      . B-D INS SYRINGE 0.5CC/31GX5/16 31G X 5/16" 0.5 ML MISC USE AS DIRECTED 4 TIMES A DAY 300 each 1  . carvedilol (COREG) 25 MG tablet TAKE 1 TABLET BY MOUTH TWICE A DAY WITH A MEAL 180 tablet 1  . cloNIDine (CATAPRES) 0.3 MG tablet Take 0.3 mg by mouth 2 (two) times daily.    Marland Kitchen dexamethasone (DECADRON) 4 MG tablet Take 2 tablets (8 mg total) by mouth 2 (two) times daily. Start the day before Taxotere. Then again the day after chemo for 3 days. 30 tablet 1  . hydrochlorothiazide (HYDRODIURIL) 25 MG tablet Take 1 tablet (25 mg total) by mouth daily. 30 tablet 5  . HYDROcodone-acetaminophen (NORCO/VICODIN) 5-325 MG tablet Take 1-2 tablets by mouth every 6 (six) hours as needed for moderate pain. 20 tablet 0  . insulin aspart (NOVOLOG) 100 UNIT/ML injection Inject 5-6 Units into the skin  2 (two) times daily with a meal.     . Insulin Glargine (BASAGLAR KWIKPEN) 100 UNIT/ML SOPN Inject 24 Units into the skin every morning.   3  . Insulin Pen Needle 31G X 5 MM MISC Use pen needles for insulin injection daily 100 each 3  . Insulin Syringe-Needle U-100 (B-D INS SYRINGE 0.5CC/30GX1/2") 30G X 1/2" 0.5 ML MISC USE AS DIRECTED 4 TIMES A DAY 300 each 3  . lidocaine-prilocaine (EMLA) cream Apply to affected area once 30 g 3  . lisinopril (PRINIVIL,ZESTRIL) 40 MG tablet TAKE 1 TABLET (40 MG TOTAL) BY MOUTH DAILY. 30 tablet  5  . metFORMIN (GLUCOPHAGE) 500 MG tablet Take 500 mg by mouth 2 (two) times daily with a meal.    . ondansetron (ZOFRAN) 8 MG tablet Take 1 tablet (8 mg total) by mouth 2 (two) times daily as needed for refractory nausea / vomiting. Start on day 3 after chemo. 30 tablet 1  . prochlorperazine (COMPAZINE) 10 MG tablet Take 1 tablet (10 mg total) by mouth every 6 (six) hours as needed (Nausea or vomiting). 30 tablet 1  . rosuvastatin (CRESTOR) 5 MG tablet Take 1 tablet (5 mg total) by mouth daily. -- need labs for further refills 90 tablet 0   No current facility-administered medications on file prior to visit.     Past Medical History:  Diagnosis Date  . Breast cancer ([place]) 11/2016   left  . GERD (gastroesophageal reflux disease)    TUMS as needed  . HTN (hypertension)    states BP has been high recently; has been on med. x 20 yr.  . Hyperlipidemia   . Insulin dependent diabetes mellitus ([place])     Past Surgical History:  Procedure Laterality Date  . BREAST LUMPECTOMY Left 05/05/2008  . BREAST LUMPECTOMY WITH RADIOACTIVE SEED AND SENTINEL LYMPH NODE BIOPSY Left 12/07/2016   Procedure: LEFT BREAST LUMPECTOMY WITH RADIOACTIVE SEED AND SENTINEL LYMPH NODE BIOPSY;  Surgeon: Erroll Luna, MD;  Location: [facility];  Service: General;  Laterality: Left;  . IR FLUORO GUIDE PORT INSERTION RIGHT  01/04/2017  . IR US GUIDE VASC ACCESS RIGHT  01/04/2017  . RE-EXCISION OF BREAST LUMPECTOMY Left 12/28/2016   Procedure: RE-EXCISION OF BREAST LUMPECTOMY;  Surgeon: Erroll Luna, MD;  Location: [facility];  Service: General;  Laterality: Left;    Social History   Social History  . Marital status: Married    Spouse name: N/A  . Number of children: 4  . Years of education: N/A   Occupational History  . [employer]   Social History Main Topics  . Smoking status: Never Smoker  . Smokeless tobacco: Current User    Types: Snuff  . Alcohol use No  . Drug use: No  .  Sexual activity: Not Currently   Other Topics Concern  . Not on file   Social History Narrative  . No narrative on file    Family History  Problem Relation Age of Onset  . Diabetes Sister     x 4  . Diabetes Brother   . Diabetes Paternal Grandmother   . Diabetes Paternal Aunt   . Heart disease Brother   . Heart disease Sister   . Emphysema Brother   . Cancer Father 57    brain tumor     Review of Systems  Constitutional: Negative for appetite change, chills, fatigue, fever and unexpected weight change.  Respiratory: Positive for cough (from allergies). Negative for  wheezing.   Cardiovascular: Negative for leg swelling.  Gastrointestinal: Positive for anal bleeding and constipation. Negative for abdominal pain, blood in stool and nausea.       No gerd  Neurological: Positive for headaches (occasional). Negative for dizziness and light-headedness.  Psychiatric/Behavioral: Negative for dysphoric mood. The patient is not nervous/anxious.        Objective:   Vitals:   01/17/17 0755  BP: 136/82  Pulse: 71  Resp: 16  Temp: 97.7 F (36.5 C)   Wt Readings from Last 3 Encounters:  01/17/17 178 lb (80.7 kg)  01/08/17 179 lb (81.2 kg)  01/04/17 179 lb (81.2 kg)   Body mass index is 33.09 kg/m.   Physical Exam    Constitutional: Appears well-developed and well-nourished. No distress.  HENT:  Head: Normocephalic and atraumatic.  Neck: Neck supple. No tracheal deviation present. No thyromegaly present.  No cervical lymphadenopathy Cardiovascular: Normal rate, regular rhythm and normal heart sounds.   No murmur heard. No carotid bruit .  No edema Pulmonary/Chest: Effort normal and breath sounds normal. No respiratory distress. No has no wheezes. No rales.  Abdomen: soft, non tender, non distended Skin: Skin is warm and dry. Not diaphoretic.  Psychiatric: Normal mood and affect. Behavior is normal.      Assessment & Plan:    See Problem List for Assessment and  Plan of chronic medical problems.   FU in 6 months

## 2017-01-17 NOTE — Patient Instructions (Addendum)
   Medications reviewed and updated.  No changes recommended at this time.   A referral was ordered for GI for a colonoscopy.  Please followup in 6 months

## 2017-01-17 NOTE — Assessment & Plan Note (Signed)
Likely from constipation Continue stool softner - adjust as needed Use miralax as needed Given last colonoscopy was 2005 will refer to GI - should have another colonoscopy

## 2017-01-17 NOTE — Progress Notes (Signed)
Pre visit review using our clinic review tool, if applicable. No additional management support is needed unless otherwise documented below in the visit note. 

## 2017-01-17 NOTE — Assessment & Plan Note (Signed)
BP well controlled Current regimen effective and well tolerated Continue current medications at current doses  

## 2017-01-17 NOTE — Assessment & Plan Note (Signed)
Chronic but worse recently due to narcotics Continue stool softener - adjust as needed Will have her see GI since her last colonoscopy was 2005

## 2017-01-17 NOTE — Assessment & Plan Note (Signed)
Management per Dr Cruzita Lederer meds recently adjusted and sugars better controlled at home Continue to remain active Continue with diabetic diet

## 2017-01-18 ENCOUNTER — Encounter: Payer: Self-pay | Admitting: *Deleted

## 2017-01-18 ENCOUNTER — Encounter: Payer: Self-pay | Admitting: Hematology

## 2017-01-18 ENCOUNTER — Ambulatory Visit: Payer: Medicare Other

## 2017-01-18 ENCOUNTER — Ambulatory Visit (HOSPITAL_BASED_OUTPATIENT_CLINIC_OR_DEPARTMENT_OTHER): Payer: Medicare Other

## 2017-01-18 ENCOUNTER — Telehealth: Payer: Self-pay | Admitting: Hematology

## 2017-01-18 ENCOUNTER — Ambulatory Visit (HOSPITAL_BASED_OUTPATIENT_CLINIC_OR_DEPARTMENT_OTHER): Payer: Medicare Other | Admitting: Hematology

## 2017-01-18 ENCOUNTER — Other Ambulatory Visit (HOSPITAL_BASED_OUTPATIENT_CLINIC_OR_DEPARTMENT_OTHER): Payer: Medicare Other

## 2017-01-18 VITALS — BP 125/58 | HR 69 | Temp 98.0°F | Resp 18 | Ht 61.5 in | Wt 178.7 lb

## 2017-01-18 VITALS — BP 153/70 | HR 73 | Temp 97.9°F | Resp 16

## 2017-01-18 DIAGNOSIS — N183 Chronic kidney disease, stage 3 (moderate): Secondary | ICD-10-CM | POA: Diagnosis not present

## 2017-01-18 DIAGNOSIS — Z5111 Encounter for antineoplastic chemotherapy: Secondary | ICD-10-CM | POA: Diagnosis not present

## 2017-01-18 DIAGNOSIS — E119 Type 2 diabetes mellitus without complications: Secondary | ICD-10-CM

## 2017-01-18 DIAGNOSIS — Z170 Estrogen receptor positive status [ER+]: Secondary | ICD-10-CM

## 2017-01-18 DIAGNOSIS — Z95828 Presence of other vascular implants and grafts: Secondary | ICD-10-CM | POA: Insufficient documentation

## 2017-01-18 DIAGNOSIS — I10 Essential (primary) hypertension: Secondary | ICD-10-CM

## 2017-01-18 DIAGNOSIS — C50412 Malignant neoplasm of upper-outer quadrant of left female breast: Secondary | ICD-10-CM

## 2017-01-18 LAB — COMPREHENSIVE METABOLIC PANEL
ALBUMIN: 3.3 g/dL — AB (ref 3.5–5.0)
ALK PHOS: 76 U/L (ref 40–150)
ALT: <3 U/L (ref 0–55)
AST: 5 U/L (ref 5–34)
Anion Gap: 11 mEq/L (ref 3–11)
BUN: 27.5 mg/dL — ABNORMAL HIGH (ref 7.0–26.0)
CHLORIDE: 105 meq/L (ref 98–109)
CO2: 24 meq/L (ref 22–29)
Calcium: 9.5 mg/dL (ref 8.4–10.4)
Creatinine: 1.3 mg/dL — ABNORMAL HIGH (ref 0.6–1.1)
EGFR: 46 mL/min/{1.73_m2} — ABNORMAL LOW (ref >90)
GLUCOSE: 101 mg/dL (ref 70–140)
POTASSIUM: 4 meq/L (ref 3.5–5.1)
SODIUM: 140 meq/L (ref 136–145)
Total Bilirubin: 0.31 mg/dL (ref 0.20–1.20)
Total Protein: 7.1 g/dL (ref 6.4–8.3)

## 2017-01-18 LAB — CBC WITH DIFFERENTIAL/PLATELET
BASO%: 1.3 % (ref 0.0–2.0)
Basophils Absolute: 0.1 10*3/uL (ref 0.0–0.1)
EOS ABS: 0.4 10*3/uL (ref 0.0–0.5)
EOS%: 6 % (ref 0.0–7.0)
HEMATOCRIT: 30.6 % — AB (ref 34.8–46.6)
HGB: 10.5 g/dL — ABNORMAL LOW (ref 11.6–15.9)
LYMPH#: 1.7 10*3/uL (ref 0.9–3.3)
LYMPH%: 26.3 % (ref 14.0–49.7)
MCH: 28 pg (ref 25.1–34.0)
MCHC: 34.4 g/dL (ref 31.5–36.0)
MCV: 81.2 fL (ref 79.5–101.0)
MONO#: 0.4 10*3/uL (ref 0.1–0.9)
MONO%: 6.3 % (ref 0.0–14.0)
NEUT%: 60.1 % (ref 38.4–76.8)
NEUTROS ABS: 4 10*3/uL (ref 1.5–6.5)
PLATELETS: 293 10*3/uL (ref 145–400)
RBC: 3.76 10*6/uL (ref 3.70–5.45)
RDW: 14.9 % — ABNORMAL HIGH (ref 11.2–14.5)
WBC: 6.6 10*3/uL (ref 3.9–10.3)

## 2017-01-18 MED ORDER — DEXAMETHASONE SODIUM PHOSPHATE 10 MG/ML IJ SOLN
INTRAMUSCULAR | Status: AC
Start: 2017-01-18 — End: ?
  Filled 2017-01-18: qty 1

## 2017-01-18 MED ORDER — DEXAMETHASONE SODIUM PHOSPHATE 10 MG/ML IJ SOLN
10.0000 mg | Freq: Once | INTRAMUSCULAR | Status: AC
  Administered 2017-01-18: 10 mg via INTRAVENOUS

## 2017-01-18 MED ORDER — HEPARIN SOD (PORK) LOCK FLUSH 100 UNIT/ML IV SOLN
500.0000 [IU] | Freq: Once | INTRAVENOUS | Status: AC | PRN
  Administered 2017-01-18: 500 [IU]
  Filled 2017-01-18: qty 5

## 2017-01-18 MED ORDER — SODIUM CHLORIDE 0.9 % IV SOLN
600.0000 mg/m2 | Freq: Once | INTRAVENOUS | Status: AC
  Administered 2017-01-18: 1120 mg via INTRAVENOUS
  Filled 2017-01-18: qty 56

## 2017-01-18 MED ORDER — PALONOSETRON HCL INJECTION 0.25 MG/5ML
INTRAVENOUS | Status: AC
  Filled 2017-01-18: qty 5

## 2017-01-18 MED ORDER — DIPHENHYDRAMINE HCL 50 MG/ML IJ SOLN
25.0000 mg | Freq: Once | INTRAMUSCULAR | Status: AC | PRN
  Administered 2017-01-18: 25 mg via INTRAVENOUS

## 2017-01-18 MED ORDER — PALONOSETRON HCL INJECTION 0.25 MG/5ML
0.2500 mg | Freq: Once | INTRAVENOUS | Status: AC
  Administered 2017-01-18: 0.25 mg via INTRAVENOUS

## 2017-01-18 MED ORDER — DOCETAXEL CHEMO INJECTION 160 MG/16ML
75.0000 mg/m2 | Freq: Once | INTRAVENOUS | Status: AC
  Administered 2017-01-18: 140 mg via INTRAVENOUS
  Filled 2017-01-18: qty 14

## 2017-01-18 MED ORDER — SODIUM CHLORIDE 0.9% FLUSH
10.0000 mL | Freq: Once | INTRAVENOUS | Status: AC
  Administered 2017-01-18: 10 mL
  Filled 2017-01-18: qty 10

## 2017-01-18 MED ORDER — SODIUM CHLORIDE 0.9% FLUSH
10.0000 mL | INTRAVENOUS | Status: DC | PRN
  Administered 2017-01-18: 10 mL
  Filled 2017-01-18: qty 10

## 2017-01-18 MED ORDER — SODIUM CHLORIDE 0.9 % IV SOLN
Freq: Once | INTRAVENOUS | Status: AC
  Administered 2017-01-18: 11:00:00 via INTRAVENOUS

## 2017-01-18 NOTE — Patient Instructions (Signed)
[facility] Discharge Instructions for Patients Receiving Chemotherapy  Today you received the following chemotherapy agents: Taxotere, Cyramza   To help prevent nausea and vomiting after your treatment, we encourage you to take your nausea medication as prescribed.   If you develop nausea and vomiting that is not controlled by your nausea medication, call the clinic.   BELOW ARE SYMPTOMS THAT SHOULD BE REPORTED IMMEDIATELY:  *FEVER GREATER THAN 100.5 F  *CHILLS WITH OR WITHOUT FEVER  NAUSEA AND VOMITING THAT IS NOT CONTROLLED WITH YOUR NAUSEA MEDICATION  *UNUSUAL SHORTNESS OF BREATH  *UNUSUAL BRUISING OR BLEEDING  TENDERNESS IN MOUTH AND THROAT WITH OR WITHOUT PRESENCE OF ULCERS  *URINARY PROBLEMS  *BOWEL PROBLEMS  UNUSUAL RASH Items with * indicate a potential emergency and should be followed up as soon as possible.  Feel free to call the clinic you have any questions or concerns. The clinic phone number is [phone].  Please show the [facility] at check-in to the Emergency Department and triage nurse.  Docetaxel injection What is this medicine? DOCETAXEL (doe se TAX el) is a chemotherapy drug. It targets fast dividing cells, like cancer cells, and causes these cells to die. This medicine is used to treat many types of cancers like breast cancer, certain stomach cancers, head and neck cancer, lung cancer, and prostate cancer. This medicine may be used for other purposes; ask your health care provider or pharmacist if you have questions. COMMON BRAND NAME(S): Docefrez, Taxotere What should I tell my health care provider before I take this medicine? They need to know if you have any of these conditions: -infection (especially a virus infection such as chickenpox, cold sores, or herpes) -liver disease -low blood counts, like low white cell, platelet, or red cell counts -an unusual or allergic reaction to docetaxel, polysorbate 80, other  chemotherapy agents, other medicines, foods, dyes, or preservatives -pregnant or trying to get pregnant -breast-feeding How should I use this medicine? This drug is given as an infusion into a vein. It is administered in a hospital or clinic by a specially trained health care professional. Talk to your pediatrician regarding the use of this medicine in children. Special care may be needed. Overdosage: If you think you have taken too much of this medicine contact a poison control center or emergency room at once. NOTE: This medicine is only for you. Do not share this medicine with others. What if I miss a dose? It is important not to miss your dose. Call your doctor or health care professional if you are unable to keep an appointment. What may interact with this medicine? -cyclosporine -erythromycin -ketoconazole -medicines to increase blood counts like filgrastim, pegfilgrastim, sargramostim -vaccines Talk to your doctor or health care professional before taking any of these medicines: -acetaminophen -aspirin -ibuprofen -ketoprofen -naproxen This list may not describe all possible interactions. Give your health care provider a list of all the medicines, herbs, non-prescription drugs, or dietary supplements you use. Also tell them if you smoke, drink alcohol, or use illegal drugs. Some items may interact with your medicine. What should I watch for while using this medicine? Your condition will be monitored carefully while you are receiving this medicine. You will need important blood work done while you are taking this medicine. This drug may make you feel generally unwell. This is not uncommon, as chemotherapy can affect healthy cells as well as cancer cells. Report any side effects. Continue your course of treatment even though you feel ill  unless your doctor tells you to stop. In some cases, you may be given additional medicines to help with side effects. Follow all directions for their  use. Call your doctor or health care professional for advice if you get a fever, chills or sore throat, or other symptoms of a cold or flu. Do not treat yourself. This drug decreases your body's ability to fight infections. Try to avoid being around people who are sick. This medicine may increase your risk to bruise or bleed. Call your doctor or health care professional if you notice any unusual bleeding. This medicine may contain alcohol in the product. You may get drowsy or dizzy. Do not drive, use machinery, or do anything that needs mental alertness until you know how this medicine affects you. Do not stand or sit up quickly, especially if you are an older patient. This reduces the risk of dizzy or fainting spells. Avoid alcoholic drinks. Do not become pregnant while taking this medicine. Women should inform their doctor if they wish to become pregnant or think they might be pregnant. There is a potential for serious side effects to an unborn child. Talk to your health care professional or pharmacist for more information. Do not breast-feed an infant while taking this medicine. What side effects may I notice from receiving this medicine? Side effects that you should report to your doctor or health care professional as soon as possible: -allergic reactions like skin rash, itching or hives, swelling of the face, lips, or tongue -low blood counts - This drug may decrease the number of white blood cells, red blood cells and platelets. You may be at increased risk for infections and bleeding. -signs of infection - fever or chills, cough, sore throat, pain or difficulty passing urine -signs of decreased platelets or bleeding - bruising, pinpoint red spots on the skin, black, tarry stools, nosebleeds -signs of decreased red blood cells - unusually weak or tired, fainting spells, lightheadedness -breathing problems -fast or irregular heartbeat -low blood pressure -mouth sores -nausea and vomiting -pain,  swelling, redness or irritation at the injection site -pain, tingling, numbness in the hands or feet -swelling of the ankle, feet, hands -weight gain Side effects that usually do not require medical attention (report to your doctor or health care professional if they continue or are bothersome): -bone pain -complete hair loss including hair on your head, underarms, pubic hair, eyebrows, and eyelashes -diarrhea -excessive tearing -changes in the color of fingernails -loosening of the fingernails -nausea -muscle pain -red flush to skin -sweating -weak or tired This list may not describe all possible side effects. Call your doctor for medical advice about side effects. You may report side effects to FDA at 1-800-FDA-1088. Where should I keep my medicine? This drug is given in a hospital or clinic and will not be stored at home. NOTE: This sheet is a summary. It may not cover all possible information. If you have questions about this medicine, talk to your doctor, pharmacist, or health care provider.  2018 Elsevier/Gold Standard (2015-10-21 12:32:56) Cyclophosphamide injection What is this medicine? CYCLOPHOSPHAMIDE (sye kloe FOSS fa mide) is a chemotherapy drug. It slows the growth of cancer cells. This medicine is used to treat many types of cancer like lymphoma, myeloma, leukemia, breast cancer, and ovarian cancer, to name a few. This medicine may be used for other purposes; ask your health care provider or pharmacist if you have questions. COMMON BRAND NAME(S): Cytoxan, Neosar What should I tell my health care provider before  I take this medicine? They need to know if you have any of these conditions: -blood disorders -history of other chemotherapy -infection -kidney disease -liver disease -recent or ongoing radiation therapy -tumors in the bone marrow -an unusual or allergic reaction to cyclophosphamide, other chemotherapy, other medicines, foods, dyes, or  preservatives -pregnant or trying to get pregnant -breast-feeding How should I use this medicine? This drug is usually given as an injection into a vein or muscle or by infusion into a vein. It is administered in a hospital or clinic by a specially trained health care professional. Talk to your pediatrician regarding the use of this medicine in children. Special care may be needed. Overdosage: If you think you have taken too much of this medicine contact a poison control center or emergency room at once. NOTE: This medicine is only for you. Do not share this medicine with others. What if I miss a dose? It is important not to miss your dose. Call your doctor or health care professional if you are unable to keep an appointment. What may interact with this medicine? This medicine may interact with the following medications: -amiodarone -amphotericin B -azathioprine -certain antiviral medicines for HIV or AIDS such as protease inhibitors (e.g., indinavir, ritonavir) and zidovudine -certain blood pressure medications such as benazepril, captopril, enalapril, fosinopril, lisinopril, moexipril, monopril, perindopril, quinapril, ramipril, trandolapril -certain cancer medications such as anthracyclines (e.g., daunorubicin, doxorubicin), busulfan, cytarabine, paclitaxel, pentostatin, tamoxifen, trastuzumab -certain diuretics such as chlorothiazide, chlorthalidone, hydrochlorothiazide, indapamide, metolazone -certain medicines that treat or prevent blood clots like warfarin -certain muscle relaxants such as succinylcholine -cyclosporine -etanercept -indomethacin -medicines to increase blood counts like filgrastim, pegfilgrastim, sargramostim -medicines used as general anesthesia -metronidazole -natalizumab This list may not describe all possible interactions. Give your health care provider a list of all the medicines, herbs, non-prescription drugs, or dietary supplements you use. Also tell them if  you smoke, drink alcohol, or use illegal drugs. Some items may interact with your medicine. What should I watch for while using this medicine? Visit your doctor for checks on your progress. This drug may make you feel generally unwell. This is not uncommon, as chemotherapy can affect healthy cells as well as cancer cells. Report any side effects. Continue your course of treatment even though you feel ill unless your doctor tells you to stop. Drink water or other fluids as directed. Urinate often, even at night. In some cases, you may be given additional medicines to help with side effects. Follow all directions for their use. Call your doctor or health care professional for advice if you get a fever, chills or sore throat, or other symptoms of a cold or flu. Do not treat yourself. This drug decreases your body's ability to fight infections. Try to avoid being around people who are sick. This medicine may increase your risk to bruise or bleed. Call your doctor or health care professional if you notice any unusual bleeding. Be careful brushing and flossing your teeth or using a toothpick because you may get an infection or bleed more easily. If you have any dental work done, tell your dentist you are receiving this medicine. You may get drowsy or dizzy. Do not drive, use machinery, or do anything that needs mental alertness until you know how this medicine affects you. Do not become pregnant while taking this medicine or for 1 year after stopping it. Women should inform their doctor if they wish to become pregnant or think they might be pregnant. Men should not father  a child while taking this medicine and for 4 months after stopping it. There is a potential for serious side effects to an unborn child. Talk to your health care professional or pharmacist for more information. Do not breast-feed an infant while taking this medicine. This medicine may interfere with the ability to have a child. This medicine  has caused ovarian failure in some women. This medicine has caused reduced sperm counts in some men. You should talk with your doctor or health care professional if you are concerned about your fertility. If you are going to have surgery, tell your doctor or health care professional that you have taken this medicine. What side effects may I notice from receiving this medicine? Side effects that you should report to your doctor or health care professional as soon as possible: -allergic reactions like skin rash, itching or hives, swelling of the face, lips, or tongue -low blood counts - this medicine may decrease the number of white blood cells, red blood cells and platelets. You may be at increased risk for infections and bleeding. -signs of infection - fever or chills, cough, sore throat, pain or difficulty passing urine -signs of decreased platelets or bleeding - bruising, pinpoint red spots on the skin, black, tarry stools, blood in the urine -signs of decreased red blood cells - unusually weak or tired, fainting spells, lightheadedness -breathing problems -dark urine -dizziness -palpitations -swelling of the ankles, feet, hands -trouble passing urine or change in the amount of urine -weight gain -yellowing of the eyes or skin Side effects that usually do not require medical attention (report to your doctor or health care professional if they continue or are bothersome): -changes in nail or skin color -hair loss -missed menstrual periods -mouth sores -nausea, vomiting This list may not describe all possible side effects. Call your doctor for medical advice about side effects. You may report side effects to FDA at 1-800-FDA-1088. Where should I keep my medicine? This drug is given in a hospital or clinic and will not be stored at home. NOTE: This sheet is a summary. It may not cover all possible information. If you have questions about this medicine, talk to your doctor, pharmacist, or  health care provider.  2018 [employer]vier/Gold Standard (2012-08-02 16:22:58)

## 2017-01-18 NOTE — Progress Notes (Signed)
Started 1st time Taxotere @ 1235. VSS, no complaints via patient.   1248 - Patient complained of itching on sides and underarm. Taxotere paused. Patient assessed. VSS Redness noted on sides and back. MD notified. 25mg  of Benadryl given. 41min saline flush. Patient reported feeling "better" with decreased itching. Taxotere restarted and finished with no complaints or further reactions.   Wylene Simmer, BSN, RN 01/18/2017 3:05 PM

## 2017-01-18 NOTE — Telephone Encounter (Signed)
Gave patient avs report and appointments for April and May.  °

## 2017-01-18 NOTE — Progress Notes (Signed)
[place]  Telephone:([phone]) [phone] Fax:([phone]) [phone]  Clinic Follow Up Note   Patient Care Team: Binnie Rail, MD as PCP - General (Internal Medicine) Philemon Kingdom, MD as Consulting Physician (Internal Medicine) Jalene Mullet, MD as Consulting Physician (Ophthalmology) Erroll Luna, MD as Consulting Physician (General Surgery) Kyung Rudd, MD as Consulting Physician (Radiation Oncology) [name], MD as Consulting Physician (Hematology) 01/18/2017  CHIEF COMPLAINTS:  Follow Up for Invasive Ductal Carcinoma of the left breast  Oncology History   Cancer Staging Malignant neoplasm of upper-outer quadrant of left breast in female, estrogen receptor positive ([place]) Staging form: Breast, AJCC 8th Edition - Clinical: Stage IIB (cT2, cN0, cM0, G3, ER: Positive, PR: Negative, HER2: Negative) - Signed by [name], MD on 11/23/2016 - Pathologic stage from 12/28/2016: Stage IIB (pT2, pN1a(sn), cM0, G3, ER: Positive, PR: Negative, HER2: Negative) - Signed by [name], MD on 01/02/2017       Malignant neoplasm of upper-outer quadrant of left breast in female, estrogen receptor positive ([place])   11/01/2016 Mammogram    Diagnostic mammogram and ultrasound of left breast and axilla showed a 3.1 x 2.1 x 1.4 cm (3.3 x 2.0 x 2.7 cm by ultrasound) mass in the upper outer quadrant of the anterior third of the left breast, associated with pleomorphic calcification. There is a 8 mm (1.8cm by [name]) prominent lymph node in the left axilla.      11/07/2016 Initial Biopsy    Left breast 1:00 position biopsy showed invasive ductal carcinoma and DCIS, G3, left axillary node biopsy was negative.      11/07/2016 Receptors her2    ER 80% positive, PR negative, HER-2 negative, Ki-67 90%      11/20/2016 Initial Diagnosis    Malignant neoplasm of upper-outer quadrant of left breast in female, estrogen receptor positive ([place])     12/07/2016 Surgery    Left lumpectomy and left axillary sentinel lymph  node sampling by Dr. Brantley Stage      12/07/2016 Pathology Results    pT2, pN1 Left Lumpectomy: Grade 3 IDC measuring 3.4 cm, carcinoma broadly present at the superior margin. Grade 3 DCIS. 1 out of 2 left axillary SLN positive for metastatic carcinoma      12/07/2016 Miscellaneous    Mammaprint showed high risk disease, basal type       12/28/2016 Surgery    Re-excision of the previously positive superior margin was negative for malignant cells.       01/04/2017 Surgery    Port inserted       HISTORY OF PRESENTING ILLNESS:  Wendy [name] 76 y.o. female is here because of invasive ductal carcinoma of the left breast. She is accompanied by her son to my clinic today. She was referred by her breast surgeon Dr. Brantley Stage.   About 2 months ago, the patient noted a mass on her left breast. She underwent a mammogram and breast US on 11/01/2016, which showed a 3.1 cm mass in the upper outer quadrant of the left breast with pleomorphic calcifications. Imaging also noted abnormal appearing lymph nodes in the left axilla. A core biopsy was done on 11/07/2016, which revealed invasive ductal carcinoma ER positive PR negative with a Ki-67 of 90%. On November 13, 2016 she met with Dr. Brantley Stage to discuss a lumpectomy. She presents for further treatment.   She presents with her son today. She felt the lump herself about 2 months ago. She noticed last month that it had gotten a little bigger. So  she went to her PCP, who ordered a mammogram. She does not get mammograms every year. Her last mammogram was May 2017, which showed no cancer. She had some pain and itching in her left nipple before her diagnosis. She has some lower back pain if she is laying down too long, but that has been present for a while. Denies any new bone or joint pain, weight loss, breast swelling, nipple discharge, or any other concerns. She is very active and takes care of herself and her husband.   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 They have a nurse for the rest of the week. He has dementia as well as numerous other health problems. Her son has moved in with her to help with her husband. She has a history of HTN and DM. Her DM is not well controlled; she takes medication for this. Her BP is also not well controlled on lisinopril. She has never had a heart attack or stroke. No surgeries in the past other than the removal of her benign lesion. No family history of breast cancer. Her father did have a brain tumor at the age of 51, which he passed from. Never smoker. She does not drink alcohol. She has 9 grandchildren.   GYN HISTORY  Menarchal: 76 y.o LMP: around 76 y.o Contraceptive: n/a HRT: No GP: 4 pregnancies, 4 children (3 boys, 1 girl). 1st child at age 50  CURRENT THERAPY: Pending adjuvant chemo Docetaxel and Cytoxan every 3 weeks, 4-6 cycles, starting on 01/18/2017  INTVERVAL HISTORY: CYBILL URIEGAS returns for a follow up and cycle 1 chemo. She is accompanied by her family. The patient states she has not filled her Dexamethasone prescription yet, but she will fill it today and begin taking it as directed. She reports no issues with her PAC, though she has some soreness to the area. She reports her surgical incision is well healed, and she denies any issues with this. No other new complaints.   MEDICAL HISTORY:  Past Medical History:  Diagnosis Date  . Breast cancer ([place]) 11/2016   left  . GERD (gastroesophageal reflux disease)    TUMS as needed  . HTN (hypertension)    states BP has been high recently; has been on med. x 20 yr.  . Hyperlipidemia   . Insulin dependent diabetes mellitus ([place])     SURGICAL HISTORY: Past Surgical History:  Procedure Laterality Date  . BREAST LUMPECTOMY Left 05/05/2008  . BREAST LUMPECTOMY WITH RADIOACTIVE SEED AND SENTINEL LYMPH NODE BIOPSY Left 12/07/2016   Procedure: LEFT BREAST LUMPECTOMY WITH RADIOACTIVE SEED AND SENTINEL LYMPH NODE BIOPSY;  Surgeon: Erroll Luna, MD;  Location: [facility];  Service: General;  Laterality: Left;  . IR FLUORO GUIDE PORT INSERTION RIGHT  01/04/2017  . IR US GUIDE VASC ACCESS RIGHT  01/04/2017  . RE-EXCISION OF BREAST LUMPECTOMY Left 12/28/2016   Procedure: RE-EXCISION OF BREAST LUMPECTOMY;  Surgeon: Erroll Luna, MD;  Location: [facility] OR;  Service: General;  Laterality: Left;    SOCIAL HISTORY: Social History   Social History  . Marital status: Married    Spouse name: N/A  . Number of children: 4  . Years of education: N/A   Occupational History  . [employer]   Social History Main Topics  . Smoking status: Never Smoker  . Smokeless tobacco: Current User    Types: Snuff  . Alcohol use No  . Drug  use: No  . Sexual activity: Not Currently   Other Topics Concern  . Not on file   Social History Narrative  . No narrative on file    FAMILY HISTORY: Family History  Problem Relation Age of Onset  . Diabetes Sister     x 4  . Diabetes Brother   . Diabetes Paternal Grandmother   . Diabetes Paternal Aunt   . Heart disease Brother   . Heart disease Sister   . Emphysema Brother   . Cancer Father 48    brain tumor     ALLERGIES:  has No Known Allergies.  MEDICATIONS:  Current Outpatient Prescriptions  Medication Sig Dispense Refill  . amLODipine (NORVASC) 10 MG tablet TAKE 1 TABLET (10 MG TOTAL) BY MOUTH DAILY. 30 tablet 5  . aspirin 81 MG tablet Take 81 mg by mouth daily.      . B-D INS SYRINGE 0.5CC/31GX5/16 31G X 5/16" 0.5 ML MISC USE AS DIRECTED 4 TIMES A DAY 300 each 1  . carvedilol (COREG) 25 MG tablet TAKE 1 TABLET BY MOUTH TWICE A DAY WITH A MEAL 180 tablet 1  . cloNIDine (CATAPRES) 0.3 MG tablet Take 0.3 mg by mouth 2 (two) times daily.    . hydrochlorothiazide (HYDRODIURIL) 25 MG tablet Take 1 tablet (25 mg total) by mouth daily. 30 tablet 5  . HYDROcodone-acetaminophen (NORCO/VICODIN) 5-325 MG tablet Take 1-2 tablets by mouth every 6 (six) hours as  needed for moderate pain. 20 tablet 0  . insulin aspart (NOVOLOG) 100 UNIT/ML injection Inject 5-6 Units into the skin 2 (two) times daily with a meal.     . Insulin Glargine (BASAGLAR KWIKPEN) 100 UNIT/ML SOPN Inject 24 Units into the skin every morning.   3  . Insulin Pen Needle 31G X 5 MM MISC Use pen needles for insulin injection daily 100 each 3  . Insulin Syringe-Needle U-100 (B-D INS SYRINGE 0.5CC/30GX1/2") 30G X 1/2" 0.5 ML MISC USE AS DIRECTED 4 TIMES A DAY 300 each 3  . lidocaine-prilocaine (EMLA) cream Apply to affected area once 30 g 3  . lisinopril (PRINIVIL,ZESTRIL) 40 MG tablet TAKE 1 TABLET (40 MG TOTAL) BY MOUTH DAILY. 30 tablet 5  . metFORMIN (GLUCOPHAGE) 500 MG tablet Take 500 mg by mouth 2 (two) times daily with a meal.    . rosuvastatin (CRESTOR) 5 MG tablet Take 1 tablet (5 mg total) by mouth daily. -- need labs for further refills 90 tablet 0  . dexamethasone (DECADRON) 4 MG tablet Take 2 tablets (8 mg total) by mouth 2 (two) times daily. Start the day before Taxotere. Then again the day after chemo for 3 days. (Patient not taking: Reported on 01/18/2017) 30 tablet 1  . ondansetron (ZOFRAN) 8 MG tablet Take 1 tablet (8 mg total) by mouth 2 (two) times daily as needed for refractory nausea / vomiting. Start on day 3 after chemo. (Patient not taking: Reported on 01/18/2017) 30 tablet 1  . prochlorperazine (COMPAZINE) 10 MG tablet Take 1 tablet (10 mg total) by mouth every 6 (six) hours as needed (Nausea or vomiting). (Patient not taking: Reported on 01/18/2017) 30 tablet 1   No current facility-administered medications for this visit.     REVIEW OF SYSTEMS:   Constitutional: Denies fevers, chills or abnormal night sweats Eyes: Denies blurriness of vision, double vision or watery eyes Ears, nose, mouth, throat, and face: Denies mucositis or sore throat Respiratory: Denies cough, dyspnea or wheezes Cardiovascular: Denies palpitation, chest discomfort or lower  extremity swelling,  (+) some soreness to PAC site Gastrointestinal:  Denies nausea, heartburn or change in bowel habits Skin: Denies abnormal skin rashes Lymphatics: Denies new lymphadenopathy or easy bruising Neurological:Denies numbness, tingling or new weaknesses Behavioral/Psych: Mood is stable, no new changes  Musculoskeletal: (+) lower back pain Breast: (+) breast pain and itching at L nipple All other systems were reviewed with the patient and are negative.  PHYSICAL EXAMINATION:  ECOG PERFORMANCE STATUS: 0 - Asymptomatic  Vitals:   01/18/17 0925 01/18/17 0926  BP: (!) 125/58 (!) 125/58  Pulse: 69 69  Resp: 18 18  Temp: 98 F (36.7 C) 98 F (36.7 C)   Filed Weights   01/18/17 0925 01/18/17 0926  Weight: 178 lb 11.2 oz (81.1 kg) 178 lb 11.2 oz (81.1 kg)   GENERAL:alert, no distress and comfortable SKIN: skin color, texture, turgor are normal, no rashes or significant lesions EYES: normal, conjunctiva are pink and non-injected, sclera clear OROPHARYNX:no exudate, no erythema and lips, buccal mucosa, and tongue normal  NECK: supple, thyroid normal size, non-tender, without nodularity LYMPH:  no palpable lymphadenopathy in the cervical, axillary or inguinal LUNGS: clear to auscultation and percussion with normal breathing effort HEART: regular rate & rhythm and no murmurs and no lower extremity edema ABDOMEN:abdomen soft, non-tender and normal bowel sounds Musculoskeletal:no cyanosis of digits and no clubbing  PSYCH: alert & oriented x 3 with fluent speech NEURO: no focal motor/sensory deficits Breasts: Breast inspection showed them to be symmetrical with no nipple discharge. Surgical scar in the left breast healing well, no discharge or skin erythema, no palpable mass or adenopathy. Old scar in inferior of L breast from previous lumpectomy. Incision site is clean without sign of infection.  LABORATORY DATA:  I have reviewed the data as listed CBC Latest Ref Rng & Units 01/18/2017 01/04/2017  12/28/2016  WBC 3.9 - 10.3 10e3/uL 6.6 6.9 7.3  Hemoglobin 11.6 - 15.9 g/dL 10.5(L) 10.9(L) 11.5(L)  Hematocrit 34.8 - 46.6 % 30.6(L) 30.9(L) 33.4(L)  Platelets 145 - 400 10e3/uL 293 264 274   CMP Latest Ref Rng & Units 01/18/2017 12/28/2016 12/04/2016  Glucose 70 - 140 mg/dl 101 249(H) 183(H)  BUN 7.0 - 26.0 mg/dL 27.5(H) 15 18  Creatinine 0.6 - 1.1 mg/dL 1.3(H) 1.23(H) 1.18(H)  Sodium 136 - 145 mEq/L 140 138 139  Potassium 3.5 - 5.1 mEq/L 4.0 3.9 4.1  Chloride 101 - 111 mmol/L - 105 105  CO2 22 - 29 mEq/L _0 Calcium 8.4 - 10.4 mg/dL 9.5 9.0 9.6  Total Protein 6.4 - 8.3 g/dL 7.1 7.0 -  Total Bilirubin 0.20 - 1.20 mg/dL 0.31 0.5 -  Alkaline Phos 40 - 150 U/L 76 67 -  AST 5 - 34 U/L 5 14(L) -  ALT 0 - 55 U/L <3 8(L) -   PATHOLOGY: Diagnosis 12/28/16 Breast, excision, left, superior margin - BENIGN FIBROADIPOSE TISSUE WITH HEALING BIOPSY SITE. - BENIGN SKELETAL MUSCLE. - THERE IS NO EVIDENCE OF MALIGNANCY. - SEE COMMENT. Microscopic Comment The surgical resection margin(s) of the specimen were inked and microscopically evaluated. (JBK:ah 01/01/17) Enid Cutter MD Pathologist, Electronic Signature (Case signed 01/01/2017)  Mammaprint 12/07/16   Diagnosis 12/07/2016 1. 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 Lymph node, sentinel, biopsy, Left Axillary ONE BENIGN LYMPH NODE (0/1) 3. Lymph node, sentinel, biopsy, Left METASTATIC CARCINOMA IN ONE  OF ONE LYMPH NODE (1/1) 4. Breast, excision, Left additional Medial Margin BENIGN BREAST TISSUE Microscopic Comment 1. BREAST, INVASIVE TUMOR Procedure: Lumpectomy Laterality: Left Tumor Size: 3.4 cm Histologic Type: Ductal carcinoma Grade: Tubular Differentiation: 3 Nuclear Pleomorphism: 2 Mitotic Count:3 Ductal Carcinoma in Situ (DCIS): present Extent of Tumor: Skin:  negative Nipple: negative Skeletal muscle: negative Margins: Invasive carcinoma, distance from closest margin: invasive carcinoma presented at the superior cauterized margin DCIS, distance from closest margin: 0.1 cm from the superior margin Regional Lymph Nodes: Number of Lymph Nodes Examined: 1 of 3 FINAL for KEMI, GELL ([id number]) Microscopic Comment(continued) Number of Sentinel Lymph Nodes Examined: 2 Lymph Nodes with Macrometastases: 1 Lymph Nodes with Micrometastases: 0 Lymph Nodes with Isolated Tumor Cells: 0 Breast Prognostic Profile: Estrogen Receptor: 80% Progesterone Receptor: 0% Her2: Negative Ki-67: 90% Pathologic Stage Classification (pTNM, AJCC 8th Edition): Primary Tumor (pT): pT2 Regional Lymph Nodes (pN): pN1 Distant Metastases (pM): pMx Casimer Lanius MD Pathologist, Electronic Signature (Case signed 12/10/2016)  Diagnosis 11/07/2016 1. Breast, left, needle core biopsy, 1:00 o'clock upper outer quadrant - INVASIVE DUCTAL CARCINOMA. - DUCTAL CARCINOMA IN SITU - SEE COMMENT. 2. Lymph node, needle/core biopsy, inferior left axilla - ONE PARTIALLY SAMPLED BENIGN LYMPH NODE WITH NO TUMOR SEEN. Microscopic Comment 1. Although definitive grading of breast carcinoma is best done on excision, the features of the invasive tumor from the left 1 o'clock upper outer quadrant breast biopsy are compatible with a grade 3 breast carcinoma. Breast prognostic markers will be performed and reported in an addendum. Findings are called to [facility] on 11/08/2016. Dr. Lyndon Code has seen the left 1:00 breast biopsy in consultation with agreement. 2. The findings from the left inferior axillary lymph node are called to the [facility] on 11/08/2016. Dr. Lyndon Code has seen the lymph node biopsy in consultation with agreement. (RH:kh 11/08/16) 1. FLUORESCENCE IN-SITU HYBRIDIZATION Results: HER2 - NEGATIVE RATIO OF HER2/CEP17 SIGNALS 1.71 AVERAGE HER2 COPY  NUMBER PER CELL 2.90 Reference Range: NEGATIVE HER2/CEP17 Ratio <2.0 and average HER2 copy number <4.0 EQUIVOCAL HER2/CEP17 Ratio <2.0 and average HER2 copy number >=4.0 and <6.0 POSITIVE HER2/CEP17 Ratio >=2.0 or <2.0 and average HER2 copy number >=6.0 1. PROGNOSTIC INDICATORS Results: IMMUNOHISTOCHEMICAL AND MORPHOMETRIC ANALYSIS PERFORMED MANUALLY Estrogen Receptor: 80%, POSITIVE, WEAK STAINING INTENSITY Progesterone Receptor: 0%, NEGATIVE Proliferation Marker Ki67: 90% COMMENT: The negative hormone receptor study(ies) in this case has An internal positive control. REFERENCE RANGE ESTROGEN RECEPTOR NEGATIVE 0% POSITIVE =>1% REFERENCE RANGE PROGESTERONE RECEPTOR NEGATIVE 0% POSITIVE =>1%  RADIOGRAPHIC STUDIES: I have personally reviewed the radiological images as listed and agreed with the findings in the report. Ir US Guide Vasc Access Right  Result Date: 01/04/2017 INDICATION: Malignant neoplasm of upper-outer quadrant of left breast. Port-A-Cath needed for treatment. EXAM: FLUOROSCOPIC AND ULTRASOUND GUIDED PLACEMENT OF A SUBCUTANEOUS PORT COMPARISON:  None. MEDICATIONS: Ancef 2 g; The antibiotic was administered within an appropriate time interval prior to skin puncture. ANESTHESIA/SEDATION: Versed 2.0 mg IV; Fentanyl 75 mcg IV; Moderate Sedation Time:  28 minutes The patient was continuously monitored during the procedure by the interventional radiology nurse under my direct supervision. FLUOROSCOPY TIME:  18 seconds, 2.7 mGy COMPLICATIONS: None immediate. PROCEDURE: The procedure, risks, benefits, and alternatives were explained to the patient. Questions regarding the procedure were encouraged and answered. The patient understands and consents to the procedure. Patient was placed supine on the interventional table. Ultrasound confirmed a patent right internal jugular vein. The right chest and neck were cleaned with  a skin antiseptic and a sterile drape was placed. Maximal barrier  sterile technique was utilized including caps, mask, sterile gowns, sterile gloves, sterile drape, hand hygiene and skin antiseptic. The right neck was anesthetized with 1% lidocaine. Small incision was made in the right neck with a blade. Micropuncture set was placed in the right internal jugular vein with ultrasound guidance. The micropuncture wire was used for measurement purposes. The right chest was anesthetized with 1% lidocaine with epinephrine. #15 blade was used to make an incision and a subcutaneous port pocket was formed. [place] was assembled. Subcutaneous tunnel was formed with a stiff tunneling device. The port catheter was brought through the subcutaneous tunnel. The port was placed in the subcutaneous pocket. The micropuncture set was exchanged for a peel-away sheath. The catheter was placed through the peel-away sheath and the tip was positioned at the superior cavoatrial junction. Catheter placement was confirmed with fluoroscopy. The port was accessed and flushed with heparinized saline. The port pocket was closed using two layers of absorbable sutures and Dermabond. The vein skin site was closed using a single layer of absorbable suture and Dermabond. Sterile dressings were applied. Patient tolerated the procedure well without an immediate complication. Ultrasound and fluoroscopic images were taken and saved for this procedure. IMPRESSION: Placement of a subcutaneous port device. Electronically Signed   By: Markus Daft M.D.   On: 01/04/2017 17:55   Ir Fluoro Guide Port Insertion Right  Result Date: 01/04/2017 INDICATION: Malignant neoplasm of upper-outer quadrant of left breast. Port-A-Cath needed for treatment. EXAM: FLUOROSCOPIC AND ULTRASOUND GUIDED PLACEMENT OF A SUBCUTANEOUS PORT COMPARISON:  None. MEDICATIONS: Ancef 2 g; The antibiotic was administered within an appropriate time interval prior to skin puncture. ANESTHESIA/SEDATION: Versed 2.0 mg IV; Fentanyl 75 mcg IV;  Moderate Sedation Time:  28 minutes The patient was continuously monitored during the procedure by the interventional radiology nurse under my direct supervision. FLUOROSCOPY TIME:  18 seconds, 2.7 mGy COMPLICATIONS: None immediate. PROCEDURE: The procedure, risks, benefits, and alternatives were explained to the patient. Questions regarding the procedure were encouraged and answered. The patient understands and consents to the procedure. Patient was placed supine on the interventional table. Ultrasound confirmed a patent right internal jugular vein. The right chest and neck were cleaned with a skin antiseptic and a sterile drape was placed. Maximal barrier sterile technique was utilized including caps, mask, sterile gowns, sterile gloves, sterile drape, hand hygiene and skin antiseptic. The right neck was anesthetized with 1% lidocaine. Small incision was made in the right neck with a blade. Micropuncture set was placed in the right internal jugular vein with ultrasound guidance. The micropuncture wire was used for measurement purposes. The right chest was anesthetized with 1% lidocaine with epinephrine. #15 blade was used to make an incision and a subcutaneous port pocket was formed. [place] was assembled. Subcutaneous tunnel was formed with a stiff tunneling device. The port catheter was brought through the subcutaneous tunnel. The port was placed in the subcutaneous pocket. The micropuncture set was exchanged for a peel-away sheath. The catheter was placed through the peel-away sheath and the tip was positioned at the superior cavoatrial junction. Catheter placement was confirmed with fluoroscopy. The port was accessed and flushed with heparinized saline. The port pocket was closed using two layers of absorbable sutures and Dermabond. The vein skin site was closed using a single layer of absorbable suture and Dermabond. Sterile dressings were applied. Patient tolerated the procedure well without an  immediate complication. Ultrasound and fluoroscopic images were taken and saved for this procedure. IMPRESSION: Placement of a subcutaneous port device. Electronically Signed   By: Markus Daft M.D.   On: 01/04/2017 17:55   ASSESSMENT & PLAN:  Mrs. Dudziak is a 77 y.o. female who presented with a self palpable left breast mass  1. Malignant neoplasm of upper-outer quadrant of left breast, Invasive Ductal Carcinoma, pT2pN1M0, stage IIB, ER+/PR-/HER2-, G3 --I discussed her surgical path result in details, she had reexcision for positive margins, her final surgical margins were negative. -Left lumpectomy and SLN biopsy on 12/07/16 revealed grade 3 IDC measuring 3.4 cm, carcinoma was broadly present at the superior margin, grade 3 DCIS, and 1 out of 2 left axillary SLNs were positive for metastatic carcinoma. With carcinoma broadly present at the superior margin. Excision of the left superior margin on 12/28/16 showed no evidence of malignancy. -Mammaprint showed high risk disease, basal type.The average 10 year risk of recurrence without adjuvant therapy is 29%. -I previously recommend her to consider adjuvant chemotherapy to reduce her risk of recurrence after surgery. -Given her age and good PS, I advise moderate chemotherapy with 4-6 cycles of Docetaxel and Cytoxan, giving every 3 weeks intravenously. -She has participated the chemotherapy class, I reviewed the potential side effects from chemotherapy again, especially neutropenic fever, management of her nausea, dehydration, etc. She voiced good understanding. - The patient will call the clinic if she feels unwell during treatment, or if she has a fever of 100.4 or greater. - I encouraged the patient to try ginger tea to reduce nausea if this becomes a problem. -Lab reviewed, adequate for treatment, we'll proceed to cycle 1 docetaxel and Cytoxan today. -She'll return next week for toxicity check up.  2. HTN and DM -Not well controlled with  medication -Follow with PCP -We previously discussed the potential impact of chemotherapy, especially premedication steroids, on her blood glucose and blood pressure. We'll monitor closely. -We previously discussed staying active and eating healthy.  - I again encouraged the patient to use nutritional supplements, such as Glucerna, to maintain adequate nutrition during treatment if the patient experiences reduced appetite. - The patient will begin taking dexamethasone as directed, 1-2 tablets daily starting the day before, and the day after chemo. This dose is reduced to consider the patient's DM.  3. CKD stage III -She has slightly elevated creatinine at baseline, EGFR 46, probably secondary to her diabetes and hypertension -We'll try to avoid dehydration, and the nephrotoxic medications.  Plan:  - Labs reviewed, adequate for treatment today. Patient will proceed with cycle 1 TC today, no need dose adjustment based on her renal function  - The patient will return to the clinic next week to follow with Nutrition for additional support during treatment. - The patient will begin taking dexamethasone as directed, 1-2 tablets daily starting the day before, and the day after chemo. - Proceed with Neulasta on 01/20/17. - Lab and f/u with APP in 1 week. -Lab, f/u, and chemo TC in 3 and 6 weeks.  All questions were answered. The patient knows to call the clinic with any problems, questions or concerns.  I spent 25 minutes counseling the patient face to face. The total time spent in the appointment was 30 minutes and more than 50% was on counseling.   [name], MD 01/18/2017   This document serves as a record of services personally performed by [name], MD. It was created on her behalf by Maryla Morrow, a trained  medical scribe. The creation of this record is based on the scribe's personal observations and the provider's statements to them. This document has been checked and approved by the  attending provider.

## 2017-01-19 LAB — CANCER ANTIGEN 27.29: CA 27.29: 15 U/mL (ref 0.0–38.6)

## 2017-01-20 ENCOUNTER — Ambulatory Visit (HOSPITAL_BASED_OUTPATIENT_CLINIC_OR_DEPARTMENT_OTHER): Payer: Medicare Other

## 2017-01-20 VITALS — BP 124/61 | HR 71 | Temp 98.3°F | Resp 18

## 2017-01-20 DIAGNOSIS — Z170 Estrogen receptor positive status [ER+]: Principal | ICD-10-CM

## 2017-01-20 DIAGNOSIS — C50412 Malignant neoplasm of upper-outer quadrant of left female breast: Secondary | ICD-10-CM

## 2017-01-20 DIAGNOSIS — Z5189 Encounter for other specified aftercare: Secondary | ICD-10-CM | POA: Diagnosis present

## 2017-01-20 MED ORDER — PEGFILGRASTIM INJECTION 6 MG/0.6ML ~~LOC~~
6.0000 mg | PREFILLED_SYRINGE | Freq: Once | SUBCUTANEOUS | Status: AC
  Administered 2017-01-20: 6 mg via SUBCUTANEOUS

## 2017-01-20 NOTE — Patient Instructions (Signed)
Pegfilgrastim injection What is this medicine? PEGFILGRASTIM (PEG fil gra stim) is a long-acting granulocyte colony-stimulating factor that stimulates the growth of neutrophils, a type of white blood cell important in the body's fight against infection. It is used to reduce the incidence of fever and infection in patients with certain types of cancer who are receiving chemotherapy that affects the bone marrow, and to increase survival after being exposed to high doses of radiation. This medicine may be used for other purposes; ask your health care provider or pharmacist if you have questions. COMMON BRAND NAME(S): Neulasta What should I tell my health care provider before I take this medicine? They need to know if you have any of these conditions: -kidney disease -latex allergy -ongoing radiation therapy -sickle cell disease -skin reactions to acrylic adhesives (On-Body Injector only) -an unusual or allergic reaction to pegfilgrastim, filgrastim, other medicines, foods, dyes, or preservatives -pregnant or trying to get pregnant -breast-feeding How should I use this medicine? This medicine is for injection under the skin. If you get this medicine at home, you will be taught how to prepare and give the pre-filled syringe or how to use the On-body Injector. Refer to the patient Instructions for Use for detailed instructions. Use exactly as directed. Tell your healthcare provider immediately if you suspect that the On-body Injector may not have performed as intended or if you suspect the use of the On-body Injector resulted in a missed or partial dose. It is important that you put your used needles and syringes in a special sharps container. Do not put them in a trash can. If you do not have a sharps container, call your pharmacist or healthcare provider to get one. Talk to your pediatrician regarding the use of this medicine in children. While this drug may be prescribed for selected conditions,  precautions do apply. Overdosage: If you think you have taken too much of this medicine contact a poison control center or emergency room at once. NOTE: This medicine is only for you. Do not share this medicine with others. What if I miss a dose? It is important not to miss your dose. Call your doctor or health care professional if you miss your dose. If you miss a dose due to an On-body Injector failure or leakage, a new dose should be administered as soon as possible using a single prefilled syringe for manual use. What may interact with this medicine? Interactions have not been studied. Give your health care provider a list of all the medicines, herbs, non-prescription drugs, or dietary supplements you use. Also tell them if you smoke, drink alcohol, or use illegal drugs. Some items may interact with your medicine. This list may not describe all possible interactions. Give your health care provider a list of all the medicines, herbs, non-prescription drugs, or dietary supplements you use. Also tell them if you smoke, drink alcohol, or use illegal drugs. Some items may interact with your medicine. What should I watch for while using this medicine? You may need blood work done while you are taking this medicine. If you are going to need a MRI, CT scan, or other procedure, tell your doctor that you are using this medicine (On-Body Injector only). What side effects may I notice from receiving this medicine? Side effects that you should report to your doctor or health care professional as soon as possible: -allergic reactions like skin rash, itching or hives, swelling of the face, lips, or tongue -dizziness -fever -pain, redness, or irritation at site   where injected -pinpoint red spots on the skin -red or dark-brown urine -shortness of breath or breathing problems -stomach or side pain, or pain at the shoulder -swelling -tiredness -trouble passing urine or change in the amount of urine Side  effects that usually do not require medical attention (report to your doctor or health care professional if they continue or are bothersome): -bone pain -muscle pain This list may not describe all possible side effects. Call your doctor for medical advice about side effects. You may report side effects to FDA at 1-800-FDA-1088. Where should I keep my medicine? Keep out of the reach of children. Store pre-filled syringes in a refrigerator between 2 and 8 degrees C (36 and 46 degrees F). Do not freeze. Keep in carton to protect from light. Throw away this medicine if it is left out of the refrigerator for more than 48 hours. Throw away any unused medicine after the expiration date. NOTE: This sheet is a summary. It may not cover all possible information. If you have questions about this medicine, talk to your doctor, pharmacist, or health care provider.  2018 Elsevier/Gold Standard (2016-09-14 12:58:03)  

## 2017-01-22 ENCOUNTER — Telehealth: Payer: Self-pay | Admitting: Hematology

## 2017-01-22 NOTE — Telephone Encounter (Signed)
Faxed completed FMLA/Disability forms to [employer] fax number [phone] on 01/18/17 for patient's son Kaeleigh Westendorf. Scanned copy of completed forms into Epic

## 2017-01-25 ENCOUNTER — Ambulatory Visit (HOSPITAL_BASED_OUTPATIENT_CLINIC_OR_DEPARTMENT_OTHER): Payer: Medicare Other

## 2017-01-25 ENCOUNTER — Other Ambulatory Visit (HOSPITAL_BASED_OUTPATIENT_CLINIC_OR_DEPARTMENT_OTHER): Payer: Medicare Other

## 2017-01-25 ENCOUNTER — Ambulatory Visit (HOSPITAL_BASED_OUTPATIENT_CLINIC_OR_DEPARTMENT_OTHER): Payer: Medicare Other | Admitting: Nurse Practitioner

## 2017-01-25 VITALS — BP 133/53 | HR 75 | Temp 98.4°F | Resp 18 | Ht 61.5 in | Wt 173.5 lb

## 2017-01-25 DIAGNOSIS — C50412 Malignant neoplasm of upper-outer quadrant of left female breast: Secondary | ICD-10-CM

## 2017-01-25 DIAGNOSIS — N189 Chronic kidney disease, unspecified: Secondary | ICD-10-CM | POA: Diagnosis not present

## 2017-01-25 DIAGNOSIS — I10 Essential (primary) hypertension: Secondary | ICD-10-CM | POA: Diagnosis not present

## 2017-01-25 DIAGNOSIS — E119 Type 2 diabetes mellitus without complications: Secondary | ICD-10-CM

## 2017-01-25 DIAGNOSIS — Z95828 Presence of other vascular implants and grafts: Secondary | ICD-10-CM

## 2017-01-25 DIAGNOSIS — Z170 Estrogen receptor positive status [ER+]: Secondary | ICD-10-CM

## 2017-01-25 LAB — COMPREHENSIVE METABOLIC PANEL
ALT: 7 U/L (ref 0–55)
ANION GAP: 9 meq/L (ref 3–11)
AST: 12 U/L (ref 5–34)
Albumin: 3.5 g/dL (ref 3.5–5.0)
Alkaline Phosphatase: 112 U/L (ref 40–150)
BUN: 23.8 mg/dL (ref 7.0–26.0)
CALCIUM: 9.8 mg/dL (ref 8.4–10.4)
CHLORIDE: 99 meq/L (ref 98–109)
CO2: 27 mEq/L (ref 22–29)
Creatinine: 1.3 mg/dL — ABNORMAL HIGH (ref 0.6–1.1)
EGFR: 49 mL/min/{1.73_m2} — AB (ref >90)
Glucose: 225 mg/dl — ABNORMAL HIGH (ref 70–140)
POTASSIUM: 3.6 meq/L (ref 3.5–5.1)
Sodium: 136 mEq/L (ref 136–145)
Total Bilirubin: 0.31 mg/dL (ref 0.20–1.20)
Total Protein: 6.6 g/dL (ref 6.4–8.3)

## 2017-01-25 LAB — CBC WITH DIFFERENTIAL/PLATELET
BASO%: 1.2 % (ref 0.0–2.0)
Basophils Absolute: 0.1 10*3/uL (ref 0.0–0.1)
EOS%: 0.7 % (ref 0.0–7.0)
Eosinophils Absolute: 0.1 10*3/uL (ref 0.0–0.5)
HCT: 31.7 % — ABNORMAL LOW (ref 34.8–46.6)
HGB: 10.9 g/dL — ABNORMAL LOW (ref 11.6–15.9)
LYMPH%: 21 % (ref 14.0–49.7)
MCH: 27.6 pg (ref 25.1–34.0)
MCHC: 34.4 g/dL (ref 31.5–36.0)
MCV: 80.1 fL (ref 79.5–101.0)
MONO#: 1 10*3/uL — ABNORMAL HIGH (ref 0.1–0.9)
MONO%: 7.7 % (ref 0.0–14.0)
NEUT#: 8.6 10*3/uL — ABNORMAL HIGH (ref 1.5–6.5)
NEUT%: 69.4 % (ref 38.4–76.8)
Platelets: 253 10*3/uL (ref 145–400)
RBC: 3.96 10*6/uL (ref 3.70–5.45)
RDW: 14.8 % — ABNORMAL HIGH (ref 11.2–14.5)
WBC: 12.4 10*3/uL — ABNORMAL HIGH (ref 3.9–10.3)
lymph#: 2.6 10*3/uL (ref 0.9–3.3)

## 2017-01-25 MED ORDER — SODIUM CHLORIDE 0.9% FLUSH
10.0000 mL | Freq: Once | INTRAVENOUS | Status: AC
  Administered 2017-01-25: 10 mL
  Filled 2017-01-25: qty 10

## 2017-01-25 MED ORDER — HEPARIN SOD (PORK) LOCK FLUSH 100 UNIT/ML IV SOLN
500.0000 [IU] | Freq: Once | INTRAVENOUS | Status: AC
  Administered 2017-01-25: 500 [IU]
  Filled 2017-01-25: qty 5

## 2017-01-25 NOTE — Patient Instructions (Signed)
Implanted Port Home Guide An implanted port is a type of central line that is placed under the skin. Central lines are used to provide IV access when treatment or nutrition needs to be given through a person's veins. Implanted ports are used for long-term IV access. An implanted port may be placed because:  You need IV medicine that would be irritating to the small veins in your hands or arms.  You need long-term IV medicines, such as antibiotics.  You need IV nutrition for a long period.  You need frequent blood draws for lab tests.  You need dialysis.  Implanted ports are usually placed in the chest area, but they can also be placed in the upper arm, the abdomen, or the leg. An implanted port has two main parts:  Reservoir. The reservoir is round and will appear as a small, raised area under your skin. The reservoir is the part where a needle is inserted to give medicines or draw blood.  Catheter. The catheter is a thin, flexible tube that extends from the reservoir. The catheter is placed into a large vein. Medicine that is inserted into the reservoir goes into the catheter and then into the vein.  How will I care for my incision site? Do not get the incision site wet. Bathe or shower as directed by your health care provider. How is my port accessed? Special steps must be taken to access the port:  Before the port is accessed, a numbing cream can be placed on the skin. This helps numb the skin over the port site.  Your health care provider uses a sterile technique to access the port. ? Your health care provider must put on a mask and sterile gloves. ? The skin over your port is cleaned carefully with an antiseptic and allowed to dry. ? The port is gently pinched between sterile gloves, and a needle is inserted into the port.  Only "non-coring" port needles should be used to access the port. Once the port is accessed, a blood return should be checked. This helps ensure that the port  is in the vein and is not clogged.  If your port needs to remain accessed for a constant infusion, a clear (transparent) bandage will be placed over the needle site. The bandage and needle will need to be changed every week, or as directed by your health care provider.  Keep the bandage covering the needle clean and dry. Do not get it wet. Follow your health care provider's instructions on how to take a shower or bath while the port is accessed.  If your port does not need to stay accessed, no bandage is needed over the port.  What is flushing? Flushing helps keep the port from getting clogged. Follow your health care provider's instructions on how and when to flush the port. Ports are usually flushed with saline solution or a medicine called heparin. The need for flushing will depend on how the port is used.  If the port is used for intermittent medicines or blood draws, the port will need to be flushed: ? After medicines have been given. ? After blood has been drawn. ? As part of routine maintenance.  If a constant infusion is running, the port may not need to be flushed.  How long will my port stay implanted? The port can stay in for as long as your health care provider thinks it is needed. When it is time for the port to come out, surgery will be   done to remove it. The procedure is similar to the one performed when the port was put in. When should I seek immediate medical care? When you have an implanted port, you should seek immediate medical care if:  You notice a bad smell coming from the incision site.  You have swelling, redness, or drainage at the incision site.  You have more swelling or pain at the port site or the surrounding area.  You have a fever that is not controlled with medicine.  This information is not intended to replace advice given to you by your health care provider. Make sure you discuss any questions you have with your health care provider. Document  Released: 09/18/2005 Document Revised: 02/24/2016 Document Reviewed: 05/26/2013 Elsevier Interactive Patient Education  2017 Elsevier Inc.  

## 2017-01-25 NOTE — Progress Notes (Signed)
[facility] OFFICE PROGRESS NOTE   Diagnosis:  Invasive ductal carcinoma left breast Oncology History   Cancer Staging Malignant neoplasm of upper-outer quadrant of left breast in female, estrogen receptor positive ([place]) Staging form: Breast, AJCC 8th Edition - Clinical: Stage IIB (cT2, cN0, cM0, G3, ER: Positive, PR: Negative, HER2: Negative) - Signed by [name], MD on 11/23/2016 - Pathologic stage from 12/28/2016: Stage IIB (pT2, pN1a(sn), cM0, G3, ER: Positive, PR: Negative, HER2: Negative) - Signed by [name], MD on 01/02/2017       Malignant neoplasm of upper-outer quadrant of left breast in female, estrogen receptor positive ([place])   11/01/2016 Mammogram    Diagnostic mammogram and ultrasound of left breast and axilla showed a 3.1 x 2.1 x 1.4 cm (3.3 x 2.0 x 2.7 cm by ultrasound) mass in the upper outer quadrant of the anterior third of the left breast, associated with pleomorphic calcification. There is a 8 mm (1.8cm by [name]) prominent lymph node in the left axilla.      11/07/2016 Initial Biopsy    Left breast 1:00 position biopsy showed invasive ductal carcinoma and DCIS, G3, left axillary node biopsy was negative.      11/07/2016 Receptors her2    ER 80% positive, PR negative, HER-2 negative, Ki-67 90%      11/20/2016 Initial Diagnosis    Malignant neoplasm of upper-outer quadrant of left breast in female, estrogen receptor positive ([place])     12/07/2016 Surgery    Left lumpectomy and left axillary sentinel lymph node sampling by Dr. Brantley Stage      12/07/2016 Pathology Results    pT2, pN1 Left Lumpectomy: Grade 3 IDC measuring 3.4 cm, carcinoma broadly present at the superior margin. Grade 3 DCIS. 1 out of 2 left axillary SLN positive for metastatic carcinoma      12/07/2016 Miscellaneous    Mammaprint showed high risk disease, basal type       12/28/2016 Surgery    Re-excision of the previously positive superior margin was  negative for malignant cells.       01/04/2017 Surgery    Port inserted       INTERVAL HISTORY:   Ms. Waterbury returns as scheduled. She completed cycle 1 Cytoxan/Taxotere 01/18/2017. She denies nausea/vomiting. No mouth sores. No diarrhea. She noted some aching in her joints. She thinks this was related to the Neulasta injection. She has a good appetite.  Objective:  Vital signs in last 24 hours:  Blood pressure (!) 133/53, pulse 75, temperature 98.4 F (36.9 C), temperature source Oral, resp. rate 18, height 5' 1.5" (1.562 m), weight 173 lb 8 oz (78.7 kg), SpO2 97 %.    HEENT: No thrush or ulcers. Resp: Lungs clear bilaterally. Cardio: Regular rate and rhythm. GI: Abdomen soft and nontender. No hepatomegaly. Vascular: No leg edema. Neuro: Alert and oriented. Breast: Status post left lumpectomy. Healing surgical incision. No erythema.  Port-A-Cath without erythema.   Lab Results:  Lab Results  Component Value Date   WBC 12.4 (H) 01/25/2017   HGB 10.9 (L) 01/25/2017   HCT 31.7 (L) 01/25/2017   MCV 80.1 01/25/2017   PLT 253 01/25/2017   NEUTROABS 8.6 (H) 01/25/2017    Imaging:  No results found.  Medications: I have reviewed the patient's current medications.  Assessment/Plan: 1. Malignant neoplasm of upper-outer quadrant of left breast, Invasive Ductal Carcinoma, pT2pN1M0, stage IIB, ER+/PR-/HER2-, G3; had reexcision for positive margins, final surgical margins negative. Mammaprint showed high risk disease. She completed  cycle 1 Taxotere/Cytoxan 01/18/2017. 2. Hypertension and diabetes mellitus. She takes multiple antihypertensive medications. She is on insulin. 3. CKD stage III. Slightly elevated creatinine at baseline, EGFR 46, probably secondary to diabetes and hypertension. Stable on labs today.   Disposition: Ms. Homen appears well. She completed cycle 1 Taxotere/Cytoxan 01/18/2017. She tolerated the chemotherapy well. She will return for a follow-up  visit and cycle 2 on 02/08/2017. She will contact the office in the interim with any problems.    Ned Card ANP/GNP-BC   01/25/2017  3:20 PM

## 2017-02-01 ENCOUNTER — Ambulatory Visit: Payer: Medicare Other | Admitting: Gastroenterology

## 2017-02-05 ENCOUNTER — Telehealth: Payer: Self-pay | Admitting: *Deleted

## 2017-02-05 ENCOUNTER — Other Ambulatory Visit: Payer: Self-pay | Admitting: Hematology

## 2017-02-05 MED ORDER — AMOXICILLIN-POT CLAVULANATE 875-125 MG PO TABS: 1.0000 | ORAL_TABLET | Freq: Two times a day (BID) | ORAL | 0 refills | Status: DC

## 2017-02-05 NOTE — Telephone Encounter (Signed)
I will call in Augmentin for her now. Thu, please call her and let her start today or tomorrow morning, I will exam her when she come in on Thursday. Call us back if it gets worse before f/u. Thanks.   [name] MD

## 2017-02-05 NOTE — Telephone Encounter (Addendum)
"  I am scheduled Thursday for chemotherapy.  Over the weekend, I developed a boil to the side of my bottom.  Will this affect my chemotherapy?  It is a round hard bump on my bottom near the lip of my bottom the size of a dime or nickel.  I never had a boil before.   No drainage or bleeding.  It hasn't come to a head yet.  Anything I need to do for it?  I feel it when I sit down.  I have had some constipation."  Return number [phone]."  Will notify provider.

## 2017-02-05 NOTE — Telephone Encounter (Signed)
Spoke with pt and informed her of Dr. Ernestina Penna instructions.  Instructed pt to start Augmentin today.  Pt understood to call office back sooner if symptoms worsened.   Confirmed next office visit on 02/08/17.

## 2017-02-07 NOTE — Progress Notes (Signed)
[facility]  [phone]) [phone] [phone]  Clinic Follow Up Note   Patient Care Team: Binnie Rail, MD as PCP - General (Internal Medicine) Philemon Kingdom, MD as Consulting Physician (Internal Medicine) Jalene Mullet, MD as Consulting Physician (Ophthalmology) Erroll Luna, MD as Consulting Physician (General Surgery) Kyung Rudd, MD as Consulting Physician (Radiation Oncology) [name], MD as Consulting Physician (Hematology) 02/08/2017  CHIEF COMPLAINTS:  Follow Up for Invasive Ductal Carcinoma of the left breast  Oncology History   Cancer Staging Malignant neoplasm of upper-outer quadrant of left breast in female, estrogen receptor positive ([place]) Staging form: Breast, AJCC 8th Edition - Clinical: Stage IIB (cT2, cN0, cM0, G3, ER: Positive, PR: Negative, HER2: Negative) - Signed by [name], MD on 11/23/2016 - Pathologic stage from 12/28/2016: Stage IIB (pT2, pN1a(sn), cM0, G3, ER: Positive, PR: Negative, HER2: Negative) - Signed by [name], MD on 01/02/2017       Malignant neoplasm of upper-outer quadrant of left breast in female, estrogen receptor positive ([place])   11/01/2016 Mammogram    Diagnostic mammogram and ultrasound of left breast and axilla showed a 3.1 x 2.1 x 1.4 cm (3.3 x 2.0 x 2.7 cm by ultrasound) mass in the upper outer quadrant of the anterior third of the left breast, associated with pleomorphic calcification. There is a 8 mm (1.8cm by [name]) prominent lymph node in the left axilla.      11/07/2016 Initial Biopsy    Left breast 1:00 position biopsy showed invasive ductal carcinoma and DCIS, G3, left axillary node biopsy was negative.      11/07/2016 Receptors her2    ER 80% positive, PR negative, HER-2 negative, Ki-67 90%      11/20/2016 Initial Diagnosis    Malignant neoplasm of upper-outer quadrant of left breast in female, estrogen receptor positive ([place])     12/07/2016 Surgery    Left lumpectomy and left axillary sentinel  lymph node sampling by Dr. Brantley Stage      12/07/2016 Pathology Results    pT2, pN1 Left Lumpectomy: Grade 3 IDC measuring 3.4 cm, carcinoma broadly present at the superior margin. Grade 3 DCIS. 1 out of 2 left axillary SLN positive for metastatic carcinoma      12/07/2016 Miscellaneous    Mammaprint showed high risk disease, basal type       12/28/2016 Surgery    Re-excision of the previously positive superior margin was negative for malignant cells.       01/04/2017 Surgery    Port inserted      01/18/2017 -  Chemotherapy    The patient had palonosetron (ALOXI) injection 0.25 mg, 0.25 mg, Intravenous,  Once, 1 of 4 cycles  pegfilgrastim (NEULASTA) injection 6 mg, 6 mg, Subcutaneous,  Once, 1 of 4 cycles  cyclophosphamide (CYTOXAN) 1,120 mg in sodium chloride 0.9 % 250 mL chemo infusion, 600 mg/m2 = 1,120 mg, Intravenous,  Once, 1 of 4 cycles  DOCEtaxel (TAXOTERE) 140 mg in dextrose 5 % 250 mL chemo infusion, 75 mg/m2 = 140 mg, Intravenous,  Once, 1 of 4 cycles  for chemotherapy treatment.         HISTORY OF PRESENTING ILLNESS:  Wendy [name] 76 y.o. female is here because of invasive ductal carcinoma of the left breast. She is accompanied by her son to my clinic today. She was referred by her breast surgeon Dr. Brantley Stage.   About 2 months ago, the patient noted a mass on her left breast. She underwent a mammogram and  breast US on 11/01/2016, which showed a 3.1 cm mass in the upper outer quadrant of the left breast with pleomorphic calcifications. Imaging also noted abnormal appearing lymph nodes in the left axilla. A core biopsy was done on 11/07/2016, which revealed invasive ductal carcinoma ER positive PR negative with a Ki-67 of 90%. On November 13, 2016 she met with Dr. Brantley Stage to discuss a lumpectomy. She presents for further treatment.   She presents with her son today. She felt the lump herself about 2 months ago. She noticed last month that it had gotten a little bigger. So she went  to her PCP, who ordered a mammogram. She does not get mammograms every year. Her last mammogram was May 2017, which showed no cancer. She had some pain and itching in her left nipple before her diagnosis. She has some lower back pain if she is laying down too long, but that has been present for a while. Denies any new bone or joint pain, weight loss, breast swelling, nipple discharge, or any other concerns. She is very active and takes care of herself and her husband.   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 They have a nurse for the rest of the week. He has dementia as well as numerous other health problems. Her son has moved in with her to help with her husband. She has a history of HTN and DM. Her DM is not well controlled; she takes medication for this. Her BP is also not well controlled on lisinopril. She has never had a heart attack or stroke. No surgeries in the past other than the removal of her benign lesion. No family history of breast cancer. Her father did have a brain tumor at the age of 38, which he passed from. Never smoker. She does not drink alcohol. She has 9 grandchildren.   GYN HISTORY  Menarchal: 76 y.o LMP: around 76 y.o Contraceptive: n/a HRT: No GP: 4 pregnancies, 4 children (3 boys, 1 girl). 1st child at age 89  CURRENT THERAPY: adjuvant chemo Docetaxel and Cytoxan every 3 weeks, 4-6 cycles, started on 01/18/2017 with Neulasta and Aloxi injections once every 1 of 4 cycles  INTVERVAL HISTORY:  TANEKIA RYANS returns for a follow up and cycle 2 chemo. She presents to the clinic today with her daughter. She reports she has been taking the biotics for the boil on her buttock. She reports the pain is gone and the swelling has gone down. She did not get a fever it is just a knot. She reports to doing well with the first treatment, The pain did last 2 weeks but it was on and off and went all over her body. 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 She said  she could increase pain medication. She does not want anything she can get addicted to. She is taking Compazine and steroids to help again nausea. She wanted to know how to take her steroids which can raise her sugar.    MEDICAL HISTORY:  Past Medical History:  Diagnosis Date  . Breast cancer ([place]) 11/2016   left  . GERD (gastroesophageal reflux disease)    TUMS as needed  . HTN (hypertension)    states BP has been high recently; has been on med. x 20 yr.  . Hyperlipidemia   . Insulin dependent diabetes mellitus ([place])     SURGICAL HISTORY: Past Surgical History:  Procedure Laterality Date  . BREAST LUMPECTOMY Left  05/05/2008  . BREAST LUMPECTOMY WITH RADIOACTIVE SEED AND SENTINEL LYMPH NODE BIOPSY Left 12/07/2016   Procedure: LEFT BREAST LUMPECTOMY WITH RADIOACTIVE SEED AND SENTINEL LYMPH NODE BIOPSY;  Surgeon: Erroll Luna, MD;  Location: [facility];  Service: General;  Laterality: Left;  . IR FLUORO GUIDE PORT INSERTION RIGHT  01/04/2017  . IR US GUIDE VASC ACCESS RIGHT  01/04/2017  . RE-EXCISION OF BREAST LUMPECTOMY Left 12/28/2016   Procedure: RE-EXCISION OF BREAST LUMPECTOMY;  Surgeon: Erroll Luna, MD;  Location: [facility];  Service: General;  Laterality: Left;    SOCIAL HISTORY: Social History   Social History  . Marital status: Married    Spouse name: N/A  . Number of children: 4  . Years of education: N/A   Occupational History  . [employer]   Social History Main Topics  . Smoking status: Never Smoker  . Smokeless tobacco: Current User    Types: Snuff  . Alcohol use No  . Drug use: No  . Sexual activity: Not Currently   Other Topics Concern  . Not on file   Social History Narrative  . No narrative on file    FAMILY HISTORY: Family History  Problem Relation Age of Onset  . Diabetes Sister        x 4  . Diabetes Brother   . Diabetes Paternal Grandmother   . Diabetes Paternal Aunt   . Heart disease Brother   . Heart  disease Sister   . Emphysema Brother   . Cancer Father 47       brain tumor     ALLERGIES:  has No Known Allergies.  MEDICATIONS:  Current Outpatient Prescriptions  Medication Sig Dispense Refill  . amLODipine (NORVASC) 10 MG tablet TAKE 1 TABLET (10 MG TOTAL) BY MOUTH DAILY. 30 tablet 5  . amoxicillin-clavulanate (AUGMENTIN) 875-125 MG tablet Take 1 tablet by mouth 2 (two) times daily. 14 tablet 0  . aspirin 81 MG tablet Take 81 mg by mouth daily.      . B-D INS SYRINGE 0.5CC/31GX5/16 31G X 5/16" 0.5 ML MISC USE AS DIRECTED 4 TIMES A DAY 300 each 1  . carvedilol (COREG) 25 MG tablet TAKE 1 TABLET BY MOUTH TWICE A DAY WITH A MEAL 180 tablet 1  . cloNIDine (CATAPRES) 0.3 MG tablet Take 0.3 mg by mouth 2 (two) times daily.    Marland Kitchen dexamethasone (DECADRON) 4 MG tablet Take 2 tablets (8 mg total) by mouth 2 (two) times daily. Start the day before Taxotere. Then again the day after chemo for 3 days. (Patient not taking: Reported on 01/25/2017) 30 tablet 1  . hydrochlorothiazide (HYDRODIURIL) 25 MG tablet Take 1 tablet (25 mg total) by mouth daily. 30 tablet 5  . HYDROcodone-acetaminophen (NORCO/VICODIN) 5-325 MG tablet Take 1-2 tablets by mouth every 6 (six) hours as needed for moderate pain. 20 tablet 0  . insulin aspart (NOVOLOG) 100 UNIT/ML injection Inject 5-6 Units into the skin 2 (two) times daily with a meal.     . Insulin Glargine (BASAGLAR KWIKPEN) 100 UNIT/ML SOPN Inject 24 Units into the skin every morning.   3  . Insulin Pen Needle 31G X 5 MM MISC Use pen needles for insulin injection daily 100 each 3  . Insulin Syringe-Needle U-100 (B-D INS SYRINGE 0.5CC/30GX1/2") 30G X 1/2" 0.5 ML MISC USE AS DIRECTED 4 TIMES A DAY 300 each 3  . lidocaine-prilocaine (EMLA) cream Apply to affected area once 30 g 3  .  lisinopril (PRINIVIL,ZESTRIL) 40 MG tablet TAKE 1 TABLET (40 MG TOTAL) BY MOUTH DAILY. 30 tablet 5  . metFORMIN (GLUCOPHAGE) 500 MG tablet Take 500 mg by mouth 2 (two) times daily with a  meal.    . ondansetron (ZOFRAN) 8 MG tablet Take 1 tablet (8 mg total) by mouth 2 (two) times daily as needed for refractory nausea / vomiting. Start on day 3 after chemo. 30 tablet 1  . prochlorperazine (COMPAZINE) 10 MG tablet Take 1 tablet (10 mg total) by mouth every 6 (six) hours as needed (Nausea or vomiting). 30 tablet 1  . rosuvastatin (CRESTOR) 5 MG tablet Take 1 tablet (5 mg total) by mouth daily. -- need labs for further refills 90 tablet 0  . traMADol (ULTRAM) 50 MG tablet Take 1 tablet (50 mg total) by mouth every 6 (six) hours as needed. 20 tablet 0   No current facility-administered medications for this visit.     REVIEW OF SYSTEMS:   Constitutional: Denies fevers, chills or abnormal night sweats Eyes: Denies blurriness of vision, double vision or watery eyes Ears, nose, mouth, throat, and face: Denies mucositis or sore throat Respiratory: Denies cough, dyspnea or wheezes Cardiovascular: Denies palpitation, chest discomfort or lower extremity swelling, (+) some soreness to PAC site Gastrointestinal:  Denies nausea, heartburn or change in bowel habits Skin: Denies abnormal skin rashes (+) knot on inner right buttock Lymphatics: Denies new lymphadenopathy or easy bruising Neurological:Denies numbness, tingling or new weaknesses Behavioral/Psych: Mood is stable, no new changes  Musculoskeletal: (+) lower back pain Breast: (+) breast pain and itching at L nipple All other systems were reviewed with the patient and are negative.  PHYSICAL EXAMINATION:  ECOG PERFORMANCE STATUS: 1  Vitals:   02/08/17 1032  BP: (!) 150/65  Pulse: 83  Resp: 17  Temp: 97.8 F (36.6 C)   Filed Weights   02/08/17 1032  Weight: 177 lb 12.8 oz (80.6 kg)     GENERAL:alert, no distress and comfortable SKIN: skin color, texture, turgor are normal, no rashes or significant lesions (+) boil on left inner buttock with mild tenderness  EYES: normal, conjunctiva are pink and non-injected, sclera  clear OROPHARYNX:no exudate, no erythema and lips, buccal mucosa, and tongue normal  NECK: supple, thyroid normal size, non-tender, without nodularity LYMPH:  no palpable lymphadenopathy in the cervical, axillary or inguinal LUNGS: clear to auscultation and percussion with normal breathing effort HEART: regular rate & rhythm and no murmurs and no lower extremity edema ABDOMEN:abdomen soft, non-tender and normal bowel sounds Musculoskeletal:no cyanosis of digits and no clubbing  PSYCH: alert & oriented x 3 with fluent speech NEURO: no focal motor/sensory deficits Breasts: Breast inspection showed them to be symmetrical with no nipple discharge. Surgical scar in the left breast healing well, no discharge or skin erythema, no palpable mass or adenopathy. Old scar in inferior of L breast from previous lumpectomy. Incision site is clean without sign of infection.  LABORATORY DATA:  I have reviewed the data as listed CBC Latest Ref Rng & Units 02/08/2017 01/25/2017 01/18/2017  WBC 3.9 - 10.3 10e3/uL 6.9 12.4(H) 6.6  Hemoglobin 11.6 - 15.9 g/dL 10.4(L) 10.9(L) 10.5(L)  Hematocrit 34.8 - 46.6 % 30.3(L) 31.7(L) 30.6(L)  Platelets 145 - 400 10e3/uL 332 253 293   CMP Latest Ref Rng & Units 02/08/2017 01/25/2017 01/18/2017  Glucose 70 - 140 mg/dl 352(H) 225(H) 101  BUN 7.0 - 26.0 mg/dL 24.5 23.8 27.5(H)  Creatinine 0.6 - 1.1 mg/dL 1.3(H) 1.3(H) 1.3(H)  Sodium 136 -  145 mEq/L 137 136 140  Potassium 3.5 - 5.1 mEq/L 4.2 3.6 4.0  Chloride 101 - 111 mmol/L - - -  CO2 22 - 29 mEq/L _0 Calcium 8.4 - 10.4 mg/dL 9.4 9.8 9.5  Total Protein 6.4 - 8.3 g/dL 7.1 6.6 7.1  Total Bilirubin 0.20 - 1.20 mg/dL 0.34 0.31 0.31  Alkaline Phos 40 - 150 U/L 99 112 76  AST 5 - 34 U/L _1 ALT 0-55 U/L U/L <6 7 <3   PATHOLOGY: Diagnosis 12/28/16 Breast, excision, left, superior margin - BENIGN FIBROADIPOSE TISSUE WITH HEALING BIOPSY SITE. - BENIGN SKELETAL MUSCLE. - THERE IS NO EVIDENCE OF MALIGNANCY. - SEE  COMMENT. Microscopic Comment The surgical resection margin(s) of the specimen were inked and microscopically evaluated. (JBK:ah 01/01/17) Enid Cutter MD Pathologist, Electronic Signature (Case signed 01/01/2017)  Mammaprint 12/07/16   Diagnosis 12/07/2016 1. 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 Lymph node, sentinel, biopsy, Left Axillary ONE BENIGN LYMPH NODE (0/1) 3. Lymph node, sentinel, biopsy, Left METASTATIC CARCINOMA IN ONE OF ONE LYMPH NODE (1/1) 4. Breast, excision, Left additional Medial Margin BENIGN BREAST TISSUE Microscopic Comment 1. BREAST, INVASIVE TUMOR Procedure: Lumpectomy Laterality: Left Tumor Size: 3.4 cm Histologic Type: Ductal carcinoma Grade: Tubular Differentiation: 3 Nuclear Pleomorphism: 2 Mitotic Count:3 Ductal Carcinoma in Situ (DCIS): present Extent of Tumor: Skin: negative Nipple: negative Skeletal muscle: negative Margins: Invasive carcinoma, distance from closest margin: invasive carcinoma presented at the superior cauterized margin DCIS, distance from closest margin: 0.1 cm from the superior margin Regional Lymph Nodes: Number of Lymph Nodes Examined: 1 of 3 FINAL for MIEKO, KNEEBONE ([id number]) Microscopic Comment(continued) Number of Sentinel Lymph Nodes Examined: 2 Lymph Nodes with Macrometastases: 1 Lymph Nodes with Micrometastases: 0 Lymph Nodes with Isolated Tumor Cells: 0 Breast Prognostic Profile: Estrogen Receptor: 80% Progesterone Receptor: 0% Her2: Negative Ki-67: 90% Pathologic Stage Classification (pTNM, AJCC 8th Edition): Primary Tumor (pT): pT2 Regional Lymph Nodes (pN): pN1 Distant Metastases (pM): pMx Casimer Lanius MD Pathologist, Electronic Signature (Case signed 12/10/2016)  Diagnosis 11/07/2016 1. Breast, left, needle core biopsy, 1:00 o'clock upper outer  quadrant - INVASIVE DUCTAL CARCINOMA. - DUCTAL CARCINOMA IN SITU - SEE COMMENT. 2. Lymph node, needle/core biopsy, inferior left axilla - ONE PARTIALLY SAMPLED BENIGN LYMPH NODE WITH NO TUMOR SEEN. Microscopic Comment 1. Although definitive grading of breast carcinoma is best done on excision, the features of the invasive tumor from the left 1 o'clock upper outer quadrant breast biopsy are compatible with a grade 3 breast carcinoma. Breast prognostic markers will be performed and reported in an addendum. Findings are called to [facility] on 11/08/2016. Dr. Lyndon Code has seen the left 1:00 breast biopsy in consultation with agreement. 2. The findings from the left inferior axillary lymph node are called to the [facility] on 11/08/2016. Dr. Lyndon Code has seen the lymph node biopsy in consultation with agreement. (RH:kh 11/08/16) 1. FLUORESCENCE IN-SITU HYBRIDIZATION Results: HER2 - NEGATIVE RATIO OF HER2/CEP17 SIGNALS 1.71 AVERAGE HER2 COPY NUMBER PER CELL 2.90 Reference Range: NEGATIVE HER2/CEP17 Ratio <2.0 and average HER2 copy number <4.0 EQUIVOCAL HER2/CEP17 Ratio <2.0 and average HER2 copy number >=4.0 and <6.0 POSITIVE HER2/CEP17 Ratio >=2.0 or <2.0 and average HER2 copy number >=6.0 1. PROGNOSTIC INDICATORS Results: IMMUNOHISTOCHEMICAL AND MORPHOMETRIC ANALYSIS PERFORMED MANUALLY Estrogen Receptor: 80%, POSITIVE, WEAK STAINING INTENSITY  Progesterone Receptor: 0%, NEGATIVE Proliferation Marker Ki67: 90% COMMENT: The negative hormone receptor study(ies) in this case has An internal positive control. REFERENCE RANGE ESTROGEN RECEPTOR NEGATIVE 0% POSITIVE =>1% REFERENCE RANGE PROGESTERONE RECEPTOR NEGATIVE 0% POSITIVE =>1%  RADIOGRAPHIC STUDIES: I have personally reviewed the radiological images as listed and agreed with the findings in the report. No results found.   Mammogram: 11/01/16 Diagnostic mammogram and ultrasound of left breast and axilla showed a  3.1 x 2.1 x 1.4 cm (3.3 x 2.0 x 2.7 cm by ultrasound) mass in the upper outer quadrant of the anterior third of the left breast, associated with pleomorphic calcification. There is a 8 mm (1.8cm by [name]) prominent lymph node in the left axilla.   ASSESSMENT & PLAN:  Mrs. San is a 76 y.o. female who presented with a self palpable left breast mass  1. Malignant neoplasm of upper-outer quadrant of left breast, Invasive Ductal Carcinoma, pT2pN1M0, stage IIB, ER+/PR-/HER2-, G3, mammaprint high risk  --I discussed her surgical path result in details, she had reexcision for positive margins, her final surgical margins were negative. -Left lumpectomy and SLN biopsy on 12/07/16 revealed grade 3 IDC measuring 3.4 cm, carcinoma was broadly present at the superior margin, grade 3 DCIS, and 1 out of 2 left axillary SLNs were positive for metastatic carcinoma. With carcinoma broadly present at the superior margin. Excision of the left superior margin on 12/28/16 showed no evidence of malignancy. -Mammaprint showed high risk disease, basal type.The average 10 year risk of recurrence without adjuvant therapy is 29%. -I previously recommend her to consider adjuvant chemotherapy to reduce her risk of recurrence after surgery. -Given her age and good PS, I previously advised moderate chemotherapy with 4-6 cycles of Docetaxel and Cytoxan, giving every 3 weeks intravenously. -She has tolerated first round of chemo well. -she had developed mild folliculitis in the left inner buttock, I have started her on antibiotics 2 days ago, I do not think she needs I&D for now. -Lab reviewed, adequate for treatment, we'll proceed cycle 2 chemotherapy today -She has had significant bone pain from Neulasta, I will order Tramadol for her pain for her to take as needed after treatment.  -I encouraged her to stay hydrated and eat well and stay active while on treatment   2. HTN and DM -Not well controlled with medication -Follow with  PCP -We previously discussed the potential impact of chemotherapy, especially premedication steroids, on her blood glucose and blood pressure. We'll monitor closely. -We previously discussed staying active and eating healthy.  - I again encouraged the patient to use nutritional supplements, such as Glucerna, to maintain adequate nutrition during treatment if the patient experiences reduced appetite. - The patient will begin taking dexamethasone as directed, 1-2 tablets daily starting the day before, and the day after chemo. This dose is reduced to consider the patient's DM.  3. CKD stage III -She has slightly elevated creatinine at baseline, EGFR 46, probably secondary to her diabetes and hypertension -We'll try to avoid dehydration, and the nephrotoxic medications.  4. Folliculitis on left inner buttocks.  -She has a boil on her left inner buttock  -I prescribed her antibiotics last week and it's getting better  -No abscess, I do not think she needs I&D for now. She will continue antibiotics twice a day until it is complete and will monitor. We discussed making sure the site is clean.    Plan:  -Labs reviewed, adequate for treatment today. Patient will proceed with cycle 2 TC  today with neulasta  -lab, flush, f/u and Chemo TC in 3 and 6 weeks  -I give her a prescription of tramadol for Neulasta ralated pain.      All questions were answered. The patient knows to call the clinic with any problems, questions or concerns.  I spent 25 minutes counseling the patient face to face. The total time spent in the appointment was 30 minutes and more than 50% was on counseling.   [name], MD 02/08/2017   This document serves as a record of services personally performed by [name], MD. It was created on her behalf by Joslyn Devon, a trained medical scribe. The creation of this record is based on the scribe's personal observations and the provider's statements to them. This document has been  checked and approved by the attending provider.

## 2017-02-08 ENCOUNTER — Ambulatory Visit (HOSPITAL_BASED_OUTPATIENT_CLINIC_OR_DEPARTMENT_OTHER): Payer: Medicare Other

## 2017-02-08 ENCOUNTER — Encounter: Payer: Self-pay | Admitting: *Deleted

## 2017-02-08 ENCOUNTER — Ambulatory Visit: Payer: Medicare Other

## 2017-02-08 ENCOUNTER — Telehealth: Payer: Self-pay | Admitting: Hematology

## 2017-02-08 ENCOUNTER — Other Ambulatory Visit (HOSPITAL_BASED_OUTPATIENT_CLINIC_OR_DEPARTMENT_OTHER): Payer: Medicare Other

## 2017-02-08 ENCOUNTER — Ambulatory Visit (HOSPITAL_BASED_OUTPATIENT_CLINIC_OR_DEPARTMENT_OTHER): Payer: Medicare Other | Admitting: Hematology

## 2017-02-08 VITALS — BP 140/63 | HR 77 | Temp 98.2°F | Resp 18

## 2017-02-08 VITALS — BP 150/65 | HR 83 | Temp 97.8°F | Resp 17 | Ht 61.5 in | Wt 177.8 lb

## 2017-02-08 DIAGNOSIS — I10 Essential (primary) hypertension: Secondary | ICD-10-CM | POA: Diagnosis not present

## 2017-02-08 DIAGNOSIS — C50412 Malignant neoplasm of upper-outer quadrant of left female breast: Secondary | ICD-10-CM

## 2017-02-08 DIAGNOSIS — E1165 Type 2 diabetes mellitus with hyperglycemia: Secondary | ICD-10-CM

## 2017-02-08 DIAGNOSIS — N183 Chronic kidney disease, stage 3 unspecified: Secondary | ICD-10-CM

## 2017-02-08 DIAGNOSIS — L739 Follicular disorder, unspecified: Secondary | ICD-10-CM | POA: Diagnosis not present

## 2017-02-08 DIAGNOSIS — Z5111 Encounter for antineoplastic chemotherapy: Secondary | ICD-10-CM | POA: Diagnosis not present

## 2017-02-08 DIAGNOSIS — E119 Type 2 diabetes mellitus without complications: Secondary | ICD-10-CM

## 2017-02-08 DIAGNOSIS — K5900 Constipation, unspecified: Secondary | ICD-10-CM

## 2017-02-08 DIAGNOSIS — IMO0001 Reserved for inherently not codable concepts without codable children: Secondary | ICD-10-CM

## 2017-02-08 DIAGNOSIS — Z170 Estrogen receptor positive status [ER+]: Principal | ICD-10-CM

## 2017-02-08 DIAGNOSIS — I5032 Chronic diastolic (congestive) heart failure: Secondary | ICD-10-CM

## 2017-02-08 LAB — CBC WITH DIFFERENTIAL/PLATELET
BASO%: 0.6 % (ref 0.0–2.0)
BASOS ABS: 0 10*3/uL (ref 0.0–0.1)
EOS%: 0 % (ref 0.0–7.0)
Eosinophils Absolute: 0 10*3/uL (ref 0.0–0.5)
HEMATOCRIT: 30.3 % — AB (ref 34.8–46.6)
HEMOGLOBIN: 10.4 g/dL — AB (ref 11.6–15.9)
LYMPH#: 0.7 10*3/uL — AB (ref 0.9–3.3)
LYMPH%: 9.7 % — ABNORMAL LOW (ref 14.0–49.7)
MCH: 28.1 pg (ref 25.1–34.0)
MCHC: 34.4 g/dL (ref 31.5–36.0)
MCV: 81.6 fL (ref 79.5–101.0)
MONO#: 0.5 10*3/uL (ref 0.1–0.9)
MONO%: 7.5 % (ref 0.0–14.0)
NEUT#: 5.7 10*3/uL (ref 1.5–6.5)
NEUT%: 82.2 % — AB (ref 38.4–76.8)
PLATELETS: 332 10*3/uL (ref 145–400)
RBC: 3.71 10*6/uL (ref 3.70–5.45)
RDW: 15.5 % — AB (ref 11.2–14.5)
WBC: 6.9 10*3/uL (ref 3.9–10.3)

## 2017-02-08 LAB — COMPREHENSIVE METABOLIC PANEL
ALBUMIN: 3.5 g/dL (ref 3.5–5.0)
ANION GAP: 11 meq/L (ref 3–11)
AST: 6 U/L (ref 5–34)
Alkaline Phosphatase: 99 U/L (ref 40–150)
BILIRUBIN TOTAL: 0.34 mg/dL (ref 0.20–1.20)
BUN: 24.5 mg/dL (ref 7.0–26.0)
CALCIUM: 9.4 mg/dL (ref 8.4–10.4)
CHLORIDE: 103 meq/L (ref 98–109)
CO2: 24 mEq/L (ref 22–29)
CREATININE: 1.3 mg/dL — AB (ref 0.6–1.1)
EGFR: 47 mL/min/{1.73_m2} — ABNORMAL LOW (ref >90)
Glucose: 352 mg/dl — ABNORMAL HIGH (ref 70–140)
Potassium: 4.2 mEq/L (ref 3.5–5.1)
Sodium: 137 mEq/L (ref 136–145)
TOTAL PROTEIN: 7.1 g/dL (ref 6.4–8.3)

## 2017-02-08 MED ORDER — DOCETAXEL CHEMO INJECTION 160 MG/16ML
75.0000 mg/m2 | Freq: Once | INTRAVENOUS | Status: AC
  Administered 2017-02-08: 140 mg via INTRAVENOUS
  Filled 2017-02-08: qty 14

## 2017-02-08 MED ORDER — PALONOSETRON HCL INJECTION 0.25 MG/5ML
INTRAVENOUS | Status: AC
  Filled 2017-02-08: qty 5

## 2017-02-08 MED ORDER — SODIUM CHLORIDE 0.9 % IV SOLN
600.0000 mg/m2 | Freq: Once | INTRAVENOUS | Status: AC
  Administered 2017-02-08: 1120 mg via INTRAVENOUS
  Filled 2017-02-08: qty 56

## 2017-02-08 MED ORDER — SODIUM CHLORIDE 0.9% FLUSH
10.0000 mL | INTRAVENOUS | Status: DC | PRN
  Administered 2017-02-08: 10 mL
  Filled 2017-02-08: qty 10

## 2017-02-08 MED ORDER — DEXAMETHASONE SODIUM PHOSPHATE 10 MG/ML IJ SOLN
INTRAMUSCULAR | Status: AC
  Filled 2017-02-08: qty 1

## 2017-02-08 MED ORDER — SODIUM CHLORIDE 0.9 % IV SOLN
Freq: Once | INTRAVENOUS | Status: AC
  Administered 2017-02-08: 11:00:00 via INTRAVENOUS

## 2017-02-08 MED ORDER — DEXAMETHASONE SODIUM PHOSPHATE 10 MG/ML IJ SOLN
10.0000 mg | Freq: Once | INTRAMUSCULAR | Status: AC
  Administered 2017-02-08: 10 mg via INTRAVENOUS

## 2017-02-08 MED ORDER — PALONOSETRON HCL INJECTION 0.25 MG/5ML
0.2500 mg | Freq: Once | INTRAVENOUS | Status: AC
  Administered 2017-02-08: 0.25 mg via INTRAVENOUS

## 2017-02-08 MED ORDER — TRAMADOL HCL 50 MG PO TABS: 50.0000 mg | ORAL_TABLET | Freq: Four times a day (QID) | ORAL | 0 refills | Status: DC | PRN

## 2017-02-08 MED ORDER — HEPARIN SOD (PORK) LOCK FLUSH 100 UNIT/ML IV SOLN
500.0000 [IU] | Freq: Once | INTRAVENOUS | Status: AC | PRN
Start: 2017-02-08 — End: 2017-02-08
  Administered 2017-02-08: 500 [IU]
  Filled 2017-02-08: qty 5

## 2017-02-08 MED ORDER — DIPHENHYDRAMINE HCL 50 MG/ML IJ SOLN
25.0000 mg | Freq: Once | INTRAMUSCULAR | Status: AC
  Administered 2017-02-08: 25 mg via INTRAVENOUS

## 2017-02-08 MED ORDER — DIPHENHYDRAMINE HCL 50 MG/ML IJ SOLN
INTRAMUSCULAR | Status: AC
  Filled 2017-02-08: qty 1

## 2017-02-08 NOTE — Telephone Encounter (Signed)
Gave patient AVS and calender per 5/10 los. - 3 week already  Scheduled.

## 2017-02-08 NOTE — Progress Notes (Signed)
Patient had reaction to Taxotere last infusion, per Dr. Burr Medico, patient to receive Benadryl 25mg  before treatment and infusion to be started as a first time infusion and infusion rate to be increased as tolerated.   Patient tolerated infusion well. Patient stable upon discharge.

## 2017-02-08 NOTE — Patient Instructions (Signed)
[facility] Discharge Instructions for Patients Receiving Chemotherapy  Today you received the following chemotherapy agents;  Taxotere and Cytoxan.    To help prevent nausea and vomiting after your treatment, we encourage you to take your nausea medication as directed.     If you develop nausea and vomiting that is not controlled by your nausea medication, call the clinic.   BELOW ARE SYMPTOMS THAT SHOULD BE REPORTED IMMEDIATELY:  *FEVER GREATER THAN 100.5 F  *CHILLS WITH OR WITHOUT FEVER  NAUSEA AND VOMITING THAT IS NOT CONTROLLED WITH YOUR NAUSEA MEDICATION  *UNUSUAL SHORTNESS OF BREATH  *UNUSUAL BRUISING OR BLEEDING  TENDERNESS IN MOUTH AND THROAT WITH OR WITHOUT PRESENCE OF ULCERS  *URINARY PROBLEMS  *BOWEL PROBLEMS  UNUSUAL RASH Items with * indicate a potential emergency and should be followed up as soon as possible.  Feel free to call the clinic you have any questions or concerns. The clinic phone number is [phone].  Please show the CHEMO ALERT CARD at check-in to the Emergency Department and triage nurse.   

## 2017-02-09 ENCOUNTER — Encounter: Payer: Self-pay | Admitting: Hematology

## 2017-02-10 ENCOUNTER — Ambulatory Visit (HOSPITAL_BASED_OUTPATIENT_CLINIC_OR_DEPARTMENT_OTHER): Payer: Medicare Other

## 2017-02-10 VITALS — BP 175/82 | HR 91 | Temp 98.8°F

## 2017-02-10 DIAGNOSIS — Z5189 Encounter for other specified aftercare: Secondary | ICD-10-CM | POA: Diagnosis not present

## 2017-02-10 DIAGNOSIS — C50412 Malignant neoplasm of upper-outer quadrant of left female breast: Secondary | ICD-10-CM

## 2017-02-10 DIAGNOSIS — Z170 Estrogen receptor positive status [ER+]: Principal | ICD-10-CM

## 2017-02-10 MED ORDER — PEGFILGRASTIM INJECTION 6 MG/0.6ML ~~LOC~~
6.0000 mg | PREFILLED_SYRINGE | Freq: Once | SUBCUTANEOUS | Status: AC
  Administered 2017-02-10: 6 mg via SUBCUTANEOUS

## 2017-02-10 NOTE — Patient Instructions (Signed)
Pegfilgrastim injection What is this medicine? PEGFILGRASTIM (PEG fil gra stim) is a long-acting granulocyte colony-stimulating factor that stimulates the growth of neutrophils, a type of white blood cell important in the body's fight against infection. It is used to reduce the incidence of fever and infection in patients with certain types of cancer who are receiving chemotherapy that affects the bone marrow, and to increase survival after being exposed to high doses of radiation. This medicine may be used for other purposes; ask your health care provider or pharmacist if you have questions. COMMON BRAND NAME(S): Neulasta What should I tell my health care provider before I take this medicine? They need to know if you have any of these conditions: -kidney disease -latex allergy -ongoing radiation therapy -sickle cell disease -skin reactions to acrylic adhesives (On-Body Injector only) -an unusual or allergic reaction to pegfilgrastim, filgrastim, other medicines, foods, dyes, or preservatives -pregnant or trying to get pregnant -breast-feeding How should I use this medicine? This medicine is for injection under the skin. If you get this medicine at home, you will be taught how to prepare and give the pre-filled syringe or how to use the On-body Injector. Refer to the patient Instructions for Use for detailed instructions. Use exactly as directed. Tell your healthcare provider immediately if you suspect that the On-body Injector may not have performed as intended or if you suspect the use of the On-body Injector resulted in a missed or partial dose. It is important that you put your used needles and syringes in a special sharps container. Do not put them in a trash can. If you do not have a sharps container, call your pharmacist or healthcare provider to get one. Talk to your pediatrician regarding the use of this medicine in children. While this drug may be prescribed for selected conditions,  precautions do apply. Overdosage: If you think you have taken too much of this medicine contact a poison control center or emergency room at once. NOTE: This medicine is only for you. Do not share this medicine with others. What if I miss a dose? It is important not to miss your dose. Call your doctor or health care professional if you miss your dose. If you miss a dose due to an On-body Injector failure or leakage, a new dose should be administered as soon as possible using a single prefilled syringe for manual use. What may interact with this medicine? Interactions have not been studied. Give your health care provider a list of all the medicines, herbs, non-prescription drugs, or dietary supplements you use. Also tell them if you smoke, drink alcohol, or use illegal drugs. Some items may interact with your medicine. This list may not describe all possible interactions. Give your health care provider a list of all the medicines, herbs, non-prescription drugs, or dietary supplements you use. Also tell them if you smoke, drink alcohol, or use illegal drugs. Some items may interact with your medicine. What should I watch for while using this medicine? You may need blood work done while you are taking this medicine. If you are going to need a MRI, CT scan, or other procedure, tell your doctor that you are using this medicine (On-Body Injector only). What side effects may I notice from receiving this medicine? Side effects that you should report to your doctor or health care professional as soon as possible: -allergic reactions like skin rash, itching or hives, swelling of the face, lips, or tongue -dizziness -fever -pain, redness, or irritation at site   where injected -pinpoint red spots on the skin -red or dark-brown urine -shortness of breath or breathing problems -stomach or side pain, or pain at the shoulder -swelling -tiredness -trouble passing urine or change in the amount of urine Side  effects that usually do not require medical attention (report to your doctor or health care professional if they continue or are bothersome): -bone pain -muscle pain This list may not describe all possible side effects. Call your doctor for medical advice about side effects. You may report side effects to FDA at 1-800-FDA-1088. Where should I keep my medicine? Keep out of the reach of children. Store pre-filled syringes in a refrigerator between 2 and 8 degrees C (36 and 46 degrees F). Do not freeze. Keep in carton to protect from light. Throw away this medicine if it is left out of the refrigerator for more than 48 hours. Throw away any unused medicine after the expiration date. NOTE: This sheet is a summary. It may not cover all possible information. If you have questions about this medicine, talk to your doctor, pharmacist, or health care provider.  2018 Elsevier/Gold Standard (2016-09-14 12:58:03)  

## 2017-02-27 NOTE — Progress Notes (Signed)
[facility]  [phone] [phone]  Clinic Follow Up Note   Patient Care Team: Binnie Rail, MD as PCP - General (Internal Medicine) Philemon Kingdom, MD as Consulting Physician (Internal Medicine) Jalene Mullet, MD as Consulting Physician (Ophthalmology) Erroll Luna, MD as Consulting Physician (General Surgery) Kyung Rudd, MD as Consulting Physician (Radiation Oncology) [name], MD as Consulting Physician (Hematology) 03/01/2017  CHIEF COMPLAINTS:  Follow Up for Invasive Ductal Carcinoma of the left breast  Oncology History   Cancer Staging Malignant neoplasm of upper-outer quadrant of left breast in female, estrogen receptor positive ([place]) Staging form: Breast, AJCC 8th Edition - Clinical: Stage IIB (cT2, cN0, cM0, G3, ER: Positive, PR: Negative, HER2: Negative) - Signed by [name], MD on 11/23/2016 - Pathologic stage from 12/28/2016: Stage IIB (pT2, pN1a(sn), cM0, G3, ER: Positive, PR: Negative, HER2: Negative) - Signed by [name], MD on 01/02/2017       Malignant neoplasm of upper-outer quadrant of left breast in female, estrogen receptor positive ([place])   11/01/2016 Mammogram    Diagnostic mammogram and ultrasound of left breast and axilla showed a 3.1 x 2.1 x 1.4 cm (3.3 x 2.0 x 2.7 cm by ultrasound) mass in the upper outer quadrant of the anterior third of the left breast, associated with pleomorphic calcification. There is a 8 mm (1.8cm by [name]) prominent lymph node in the left axilla.      11/07/2016 Initial Biopsy    Left breast 1:00 position biopsy showed invasive ductal carcinoma and DCIS, G3, left axillary node biopsy was negative.      11/07/2016 Receptors her2    ER 80% positive, PR negative, HER-2 negative, Ki-67 90%      11/20/2016 Initial Diagnosis    Malignant neoplasm of upper-outer quadrant of left breast in female, estrogen receptor positive ([place])     12/07/2016 Surgery    Left lumpectomy and left axillary sentinel  lymph node sampling by Dr. Brantley Stage      12/07/2016 Pathology Results    pT2, pN1 Left Lumpectomy: Grade 3 IDC measuring 3.4 cm, carcinoma broadly present at the superior margin. Grade 3 DCIS. 1 out of 2 left axillary SLN positive for metastatic carcinoma      12/07/2016 Miscellaneous    Mammaprint showed high risk disease, basal type       12/28/2016 Surgery    Re-excision of the previously positive superior margin was negative for malignant cells.       01/04/2017 Surgery    Port inserted      01/18/2017 -  Chemotherapy    The patient had palonosetron (ALOXI) injection 0.25 mg, 0.25 mg, Intravenous,  Once, 1 of 4 cycles  pegfilgrastim (NEULASTA) injection 6 mg, 6 mg, Subcutaneous,  Once, 1 of 4 cycles  cyclophosphamide (CYTOXAN) 1,120 mg in sodium chloride 0.9 % 250 mL chemo infusion, 600 mg/m2 = 1,120 mg, Intravenous,  Once, 1 of 4 cycles  DOCEtaxel (TAXOTERE) 140 mg in dextrose 5 % 250 mL chemo infusion, 75 mg/m2 = 140 mg, Intravenous,  Once, 1 of 4 cycles  for chemotherapy treatment.         HISTORY OF PRESENTING ILLNESS:  Wendy [name] 76 y.o. female is here because of invasive ductal carcinoma of the left breast. She is accompanied by her son to my clinic today. She was referred by her breast surgeon Dr. Brantley Stage.   About 2 months ago, the patient noted a mass on her left breast. She underwent a mammogram and  breast US on 11/01/2016, which showed a 3.1 cm mass in the upper outer quadrant of the left breast with pleomorphic calcifications. Imaging also noted abnormal appearing lymph nodes in the left axilla. A core biopsy was done on 11/07/2016, which revealed invasive ductal carcinoma ER positive PR negative with a Ki-67 of 90%. On November 13, 2016 she met with Dr. Brantley Stage to discuss a lumpectomy. She presents for further treatment.   She presents with her son today. She felt the lump herself about 2 months ago. She noticed last month that it had gotten a little bigger. So she went  to her PCP, who ordered a mammogram. She does not get mammograms every year. Her last mammogram was May 2017, which showed no cancer. She had some pain and itching in her left nipple before her diagnosis. She has some lower back pain if she is laying down too long, but that has been present for a while. Denies any new bone or joint pain, weight loss, breast swelling, nipple discharge, or any other concerns. She is very active and takes care of herself and her husband.   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 They have a nurse for the rest of the week. He has dementia as well as numerous other health problems. Her son has moved in with her to help with her husband. She has a history of HTN and DM. Her DM is not well controlled; she takes medication for this. Her BP is also not well controlled on lisinopril. She has never had a heart attack or stroke. No surgeries in the past other than the removal of her benign lesion. No family history of breast cancer. Her father did have a brain tumor at the age of 68, which he passed from. Never smoker. She does not drink alcohol. She has 9 grandchildren.   GYN HISTORY  Menarchal: 76 y.o LMP: around 76 y.o Contraceptive: n/a HRT: No GP: 4 pregnancies, 4 children (3 boys, 1 girl). 1st child at age 59  CURRENT THERAPY: adjuvant chemo Docetaxel and Cytoxan every 3 weeks, 4-6 cycles, started on 01/18/2017 with Neulasta, plan for 4 cycles  INTVERVAL HISTORY:  Wendy [name] returns for a follow up and cycle 3 chemo. She presents to the clinic today alone but was dropped off. She reports her appetite is not good but she makes herself eat. She has a lot of gas and she passes gas whenever she gets up and moves. She is constipated. She takes an over-the-outer medication for her constipation. She denies nausea. She takes Lantis and Novolog for her Diabetes. She checks her sugar and it is usually 200-300. She is seeing a specialist about this.      MEDICAL HISTORY:  Past Medical History:  Diagnosis Date  . Breast cancer ([place]) 11/2016   left  . GERD (gastroesophageal reflux disease)    TUMS as needed  . HTN (hypertension)    states BP has been high recently; has been on med. x 20 yr.  . Hyperlipidemia   . Insulin dependent diabetes mellitus ([place])     SURGICAL HISTORY: Past Surgical History:  Procedure Laterality Date  . BREAST LUMPECTOMY Left 05/05/2008  . BREAST LUMPECTOMY WITH RADIOACTIVE SEED AND SENTINEL LYMPH NODE BIOPSY Left 12/07/2016   Procedure: LEFT BREAST LUMPECTOMY WITH RADIOACTIVE SEED AND SENTINEL LYMPH NODE BIOPSY;  Surgeon: Erroll Luna, MD;  Location: [facility];  Service: General;  Laterality: Left;  . IR FLUORO  GUIDE PORT INSERTION RIGHT  01/04/2017  . IR US GUIDE VASC ACCESS RIGHT  01/04/2017  . RE-EXCISION OF BREAST LUMPECTOMY Left 12/28/2016   Procedure: RE-EXCISION OF BREAST LUMPECTOMY;  Surgeon: Erroll Luna, MD;  Location: [facility] OR;  Service: General;  Laterality: Left;    SOCIAL HISTORY: Social History   Social History  . Marital status: Married    Spouse name: N/A  . Number of children: 4  . Years of education: N/A   Occupational History  . [employer]   Social History Main Topics  . Smoking status: Never Smoker  . Smokeless tobacco: Current User    Types: Snuff  . Alcohol use No  . Drug use: No  . Sexual activity: Not Currently   Other Topics Concern  . Not on file   Social History Narrative  . No narrative on file    FAMILY HISTORY: Family History  Problem Relation Age of Onset  . Diabetes Sister        x 4  . Diabetes Brother   . Diabetes Paternal Grandmother   . Diabetes Paternal Aunt   . Heart disease Brother   . Heart disease Sister   . Emphysema Brother   . Cancer Father 6       brain tumor     ALLERGIES:  has No Known Allergies.  MEDICATIONS:  Current Outpatient Prescriptions  Medication Sig Dispense Refill  .  amLODipine (NORVASC) 10 MG tablet TAKE 1 TABLET (10 MG TOTAL) BY MOUTH DAILY. 30 tablet 5  . amoxicillin-clavulanate (AUGMENTIN) 875-125 MG tablet Take 1 tablet by mouth 2 (two) times daily. 14 tablet 0  . aspirin 81 MG tablet Take 81 mg by mouth daily.      . B-D INS SYRINGE 0.5CC/31GX5/16 31G X 5/16" 0.5 ML MISC USE AS DIRECTED 4 TIMES A DAY 300 each 1  . carvedilol (COREG) 25 MG tablet TAKE 1 TABLET BY MOUTH TWICE A DAY WITH A MEAL 180 tablet 1  . cloNIDine (CATAPRES) 0.3 MG tablet Take 0.3 mg by mouth 2 (two) times daily.    Marland Kitchen dexamethasone (DECADRON) 4 MG tablet Take 2 tablets (8 mg total) by mouth 2 (two) times daily. Start the day before Taxotere. Then again the day after chemo for 3 days. 30 tablet 1  . hydrochlorothiazide (HYDRODIURIL) 25 MG tablet Take 1 tablet (25 mg total) by mouth daily. 30 tablet 5  . HYDROcodone-acetaminophen (NORCO/VICODIN) 5-325 MG tablet Take 1-2 tablets by mouth every 6 (six) hours as needed for moderate pain. 20 tablet 0  . insulin aspart (NOVOLOG) 100 UNIT/ML injection Inject 5-6 Units into the skin 2 (two) times daily with a meal.     . Insulin Glargine (BASAGLAR KWIKPEN) 100 UNIT/ML SOPN Inject 24 Units into the skin every morning.   3  . Insulin Pen Needle 31G X 5 MM MISC Use pen needles for insulin injection daily 100 each 3  . Insulin Syringe-Needle U-100 (B-D INS SYRINGE 0.5CC/30GX1/2") 30G X 1/2" 0.5 ML MISC USE AS DIRECTED 4 TIMES A DAY 300 each 3  . lidocaine-prilocaine (EMLA) cream Apply to affected area once 30 g 3  . lisinopril (PRINIVIL,ZESTRIL) 40 MG tablet TAKE 1 TABLET (40 MG TOTAL) BY MOUTH DAILY. 30 tablet 5  . metFORMIN (GLUCOPHAGE) 500 MG tablet Take 500 mg by mouth 2 (two) times daily with a meal.    . ondansetron (ZOFRAN) 8 MG tablet Take 1 tablet (8 mg total) by mouth 2 (two)  times daily as needed for refractory nausea / vomiting. Start on day 3 after chemo. 30 tablet 1  . prochlorperazine (COMPAZINE) 10 MG tablet Take 1 tablet (10 mg  total) by mouth every 6 (six) hours as needed (Nausea or vomiting). 30 tablet 1  . rosuvastatin (CRESTOR) 5 MG tablet Take 1 tablet (5 mg total) by mouth daily. -- need labs for further refills 90 tablet 0  . traMADol (ULTRAM) 50 MG tablet Take 1 tablet (50 mg total) by mouth every 6 (six) hours as needed. 20 tablet 0   No current facility-administered medications for this visit.    Facility-Administered Medications Ordered in Other Visits  Medication Dose Route Frequency Provider Last Rate Last Dose  . sodium chloride flush (NS) 0.9 % injection 10 mL  10 mL Intracatheter PRN [name], MD   10 mL at 03/01/17 1431    REVIEW OF SYSTEMS:   Constitutional: Denies fevers, chills or abnormal night sweats (+) loss of appetite and taste Eyes: Denies blurriness of vision, double vision or watery eyes Ears, nose, mouth, throat, and face: Denies mucositis or sore throat Respiratory: Denies cough, dyspnea or wheezes Cardiovascular: Denies palpitation, chest discomfort or lower extremity swelling, (+) some soreness to PAC site Gastrointestinal:  Denies nausea, heartburn (+) gas and constipation  Skin: Denies abnormal skin rashes (+) knot on inner right buttock Lymphatics: Denies new lymphadenopathy or easy bruising Neurological:Denies numbness, tingling or new weaknesses Behavioral/Psych: Mood is stable, no new changes  Musculoskeletal: (+) lower back pain Breast: (+) breast pain and itching at L nipple All other systems were reviewed with the patient and are negative.  PHYSICAL EXAMINATION:  ECOG PERFORMANCE STATUS: 1  Vitals:   03/01/17 1108  BP: (!) 143/58  Pulse: 86  Resp: 18  Temp: 97.7 F (36.5 C)   Filed Weights   03/01/17 1108  Weight: 177 lb 4.8 oz (80.4 kg)     GENERAL:alert, no distress and comfortable SKIN: skin color, texture, turgor are normal, no rashes or significant lesions (+) boil on left inner buttock with mild tenderness  EYES: normal, conjunctiva are pink and  non-injected, sclera clear OROPHARYNX:no exudate, no erythema and lips, buccal mucosa, and tongue normal  NECK: supple, thyroid normal size, non-tender, without nodularity LYMPH:  no palpable lymphadenopathy in the cervical, axillary or inguinal LUNGS: clear to auscultation and percussion with normal breathing effort HEART: regular rate & rhythm and no murmurs and no lower extremity edema ABDOMEN:abdomen soft, non-tender and normal bowel sounds Musculoskeletal:no cyanosis of digits and no clubbing  PSYCH: alert & oriented x 3 with fluent speech NEURO: no focal motor/sensory deficits Breasts: Breast inspection showed them to be symmetrical with no nipple discharge. Surgical scar in the left breast healing well, no discharge or skin erythema, no palpable mass or adenopathy. Old scar in inferior of L breast from previous lumpectomy. Incision site is clean without sign of infection.  LABORATORY DATA:  I have reviewed the data as listed CBC Latest Ref Rng & Units 03/01/2017 02/08/2017 01/25/2017  WBC 3.9 - 10.3 10e3/uL 6.3 6.9 12.4(H)  Hemoglobin 11.6 - 15.9 g/dL 9.4(L) 10.4(L) 10.9(L)  Hematocrit 34.8 - 46.6 % 26.3(L) 30.3(L) 31.7(L)  Platelets 145 - 400 10e3/uL 372 332 253   CMP Latest Ref Rng & Units 03/01/2017 02/08/2017 01/25/2017  Glucose 70 - 140 mg/dl 431(H) 352(H) 225(H)  BUN 7.0 - 26.0 mg/dL 27.4(H) 24.5 23.8  Creatinine 0.6 - 1.1 mg/dL 1.3(H) 1.3(H) 1.3(H)  Sodium 136 - 145 mEq/L 135(L) 137  136  Potassium 3.5 - 5.1 mEq/L 4.1 4.2 3.6  Chloride 101 - 111 mmol/L - - -  CO2 22 - 29 mEq/L _0 Calcium 8.4 - 10.4 mg/dL 9.1 9.4 9.8  Total Protein 6.4 - 8.3 g/dL 6.7 7.1 6.6  Total Bilirubin 0.20 - 1.20 mg/dL 0.34 0.34 0.31  Alkaline Phos 40 - 150 U/L 88 99 112  AST 5 - 34 U/L _1 ALT 0-55 U/L U/L <6 <6 7   PATHOLOGY: Diagnosis 12/28/16 Breast, excision, left, superior margin - BENIGN FIBROADIPOSE TISSUE WITH HEALING BIOPSY SITE. - BENIGN SKELETAL MUSCLE. - THERE IS NO  EVIDENCE OF MALIGNANCY. - SEE COMMENT. Microscopic Comment The surgical resection margin(s) of the specimen were inked and microscopically evaluated. (JBK:ah 01/01/17) Enid Cutter MD Pathologist, Electronic Signature (Case signed 01/01/2017)  Mammaprint 12/07/16   Diagnosis 12/07/2016 1. 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 Lymph node, sentinel, biopsy, Left Axillary ONE BENIGN LYMPH NODE (0/1) 3. Lymph node, sentinel, biopsy, Left METASTATIC CARCINOMA IN ONE OF ONE LYMPH NODE (1/1) 4. Breast, excision, Left additional Medial Margin BENIGN BREAST TISSUE Microscopic Comment 1. BREAST, INVASIVE TUMOR Procedure: Lumpectomy Laterality: Left Tumor Size: 3.4 cm Histologic Type: Ductal carcinoma Grade: Tubular Differentiation: 3 Nuclear Pleomorphism: 2 Mitotic Count:3 Ductal Carcinoma in Situ (DCIS): present Extent of Tumor: Skin: negative Nipple: negative Skeletal muscle: negative Margins: Invasive carcinoma, distance from closest margin: invasive carcinoma presented at the superior cauterized margin DCIS, distance from closest margin: 0.1 cm from the superior margin Regional Lymph Nodes: Number of Lymph Nodes Examined: 1 of 3 FINAL for LATORIE, MONTESANO ([id number]) Microscopic Comment(continued) Number of Sentinel Lymph Nodes Examined: 2 Lymph Nodes with Macrometastases: 1 Lymph Nodes with Micrometastases: 0 Lymph Nodes with Isolated Tumor Cells: 0 Breast Prognostic Profile: Estrogen Receptor: 80% Progesterone Receptor: 0% Her2: Negative Ki-67: 90% Pathologic Stage Classification (pTNM, AJCC 8th Edition): Primary Tumor (pT): pT2 Regional Lymph Nodes (pN): pN1 Distant Metastases (pM): pMx Casimer Lanius MD Pathologist, Electronic Signature (Case signed 12/10/2016)  Diagnosis 11/07/2016 1. Breast, left, needle core  biopsy, 1:00 o'clock upper outer quadrant - INVASIVE DUCTAL CARCINOMA. - DUCTAL CARCINOMA IN SITU - SEE COMMENT. 2. Lymph node, needle/core biopsy, inferior left axilla - ONE PARTIALLY SAMPLED BENIGN LYMPH NODE WITH NO TUMOR SEEN. Microscopic Comment 1. Although definitive grading of breast carcinoma is best done on excision, the features of the invasive tumor from the left 1 o'clock upper outer quadrant breast biopsy are compatible with a grade 3 breast carcinoma. Breast prognostic markers will be performed and reported in an addendum. Findings are called to [facility] on 11/08/2016. Dr. Lyndon Code has seen the left 1:00 breast biopsy in consultation with agreement. 2. The findings from the left inferior axillary lymph node are called to the [facility] on 11/08/2016. Dr. Lyndon Code has seen the lymph node biopsy in consultation with agreement. (RH:kh 11/08/16) 1. FLUORESCENCE IN-SITU HYBRIDIZATION Results: HER2 - NEGATIVE RATIO OF HER2/CEP17 SIGNALS 1.71 AVERAGE HER2 COPY NUMBER PER CELL 2.90 Reference Range: NEGATIVE HER2/CEP17 Ratio <2.0 and average HER2 copy number <4.0 EQUIVOCAL HER2/CEP17 Ratio <2.0 and average HER2 copy number >=4.0 and <6.0 POSITIVE HER2/CEP17 Ratio >=2.0 or <2.0 and average HER2 copy number >=6.0 1. PROGNOSTIC INDICATORS Results: IMMUNOHISTOCHEMICAL AND MORPHOMETRIC ANALYSIS PERFORMED MANUALLY Estrogen Receptor: 80%, POSITIVE, WEAK STAINING INTENSITY Progesterone Receptor: 0%, NEGATIVE  Proliferation Marker Ki67: 90% COMMENT: The negative hormone receptor study(ies) in this case has An internal positive control. REFERENCE RANGE ESTROGEN RECEPTOR NEGATIVE 0% POSITIVE =>1% REFERENCE RANGE PROGESTERONE RECEPTOR NEGATIVE 0% POSITIVE =>1%  RADIOGRAPHIC STUDIES: I have personally reviewed the radiological images as listed and agreed with the findings in the report. No results found.   Mammogram: 11/01/16 Diagnostic mammogram and ultrasound of  left breast and axilla showed a 3.1 x 2.1 x 1.4 cm (3.3 x 2.0 x 2.7 cm by ultrasound) mass in the upper outer quadrant of the anterior third of the left breast, associated with pleomorphic calcification. There is a 8 mm (1.8cm by [name]) prominent lymph node in the left axilla.   ASSESSMENT & PLAN:  Mrs. Beckmann is a 76 y.o. female who presented with a self palpable left breast mass  1. Malignant neoplasm of upper-outer quadrant of left breast, Invasive Ductal Carcinoma, pT2pN1M0, stage IIB, ER+/PR-/HER2-, G3, mammaprint high risk  --I discussed her surgical path result in details, she had reexcision for positive margins, her final surgical margins were negative. -Left lumpectomy and SLN biopsy on 12/07/16 revealed grade 3 IDC measuring 3.4 cm, carcinoma was broadly present at the superior margin, grade 3 DCIS, and 1 out of 2 left axillary SLNs were positive for metastatic carcinoma. With carcinoma broadly present at the superior margin. Excision of the left superior margin on 12/28/16 showed no evidence of malignancy. -Mammaprint showed high risk disease, basal type.The average 10 year risk of recurrence without adjuvant therapy is 29%. -I previously recommend her to consider adjuvant chemotherapy to reduce her risk of recurrence after surgery. -Given her age and good PS, I previously advised moderate chemotherapy with 4-6 cycles of Docetaxel and Cytoxan, giving every 3 weeks intravenously. -She has tolerated first round of chemo well. -she had developed mild folliculitis in the left inner buttock, I have started her on antibiotics 2 days ago, I do not think she needs I&D for now. -Lab previously reviewed, adequate for treatment, we'll proceed cycle 2 chemotherapy today -She has had significant bone pain from Neulasta, I will order Tramadol for her pain for her to take as needed after treatment, it's manageable now   -I previously encouraged her to stay hydrated and eat well and stay active while on  treatment  Labs reviewed and sugar is high but blood counts are good, will reduced pre-med dexa from 18m to 456mtoday to avoid worsening hyperglycemia  -She is taking Dexamethasone the day before, of anf after treatment. I suggest she should increase her Lantus and Novolog on those days, she will call her PCP  -He last treatment will be in 3 weeks.     2. HTN and DM, uncontrolled hyperglycemia  -Not well controlled with medication -Follow with PCP -We previously discussed the potential impact of chemotherapy, especially premedication steroids, on her blood glucose and blood pressure. We'll monitor closely. -We previously discussed staying active and eating healthy.  - The patient will begin taking dexamethasone as directed, 1 tablets daily starting the day before, and the day after chemo. This dose is reduced to consider the patient's DM. -Her sugar is high today at 431 (03/01/17) and I Highly encouraged her to see her PCP and specialist on about changing the dosage of her DM medications.     3. CKD stage III -She has slightly elevated creatinine at baseline, EGFR 46, probably secondary to her diabetes and hypertension -We'll try to avoid dehydration, and the nephrotoxic medications. -Her cr has been  stable    Plan:  Lab, flush, f/u with app and chemo TC in 3 weeks   -Labs reviewed, adequate for treatment today, C3 TC, will reduced dexa to 9m due to hyperglycemia -she will call PCP about direction of her lantus and humalog    All questions were answered. The patient knows to call the clinic with any problems, questions or concerns.  I spent 25 minutes counseling the patient face to face. The total time spent in the appointment was 30 minutes and more than 50% was on counseling.   FTruitt Merle MD 03/01/2017   This document serves as a record of services personally performed by YTruitt Merle MD. It was created on her behalf by AJoslyn Devon a trained medical scribe. The creation of this  record is based on the scribe's personal observations and the provider's statements to them. This document has been checked and approved by the attending provider.

## 2017-03-01 ENCOUNTER — Ambulatory Visit (HOSPITAL_BASED_OUTPATIENT_CLINIC_OR_DEPARTMENT_OTHER): Payer: Medicare Other | Admitting: Hematology

## 2017-03-01 ENCOUNTER — Other Ambulatory Visit (HOSPITAL_BASED_OUTPATIENT_CLINIC_OR_DEPARTMENT_OTHER): Payer: Medicare Other

## 2017-03-01 ENCOUNTER — Ambulatory Visit (HOSPITAL_BASED_OUTPATIENT_CLINIC_OR_DEPARTMENT_OTHER): Payer: Medicare Other

## 2017-03-01 ENCOUNTER — Ambulatory Visit: Payer: Medicare Other

## 2017-03-01 ENCOUNTER — Encounter: Payer: Self-pay | Admitting: Hematology

## 2017-03-01 VITALS — BP 143/58 | HR 86 | Temp 97.7°F | Resp 18 | Ht 61.5 in | Wt 177.3 lb

## 2017-03-01 DIAGNOSIS — N183 Chronic kidney disease, stage 3 unspecified: Secondary | ICD-10-CM

## 2017-03-01 DIAGNOSIS — I10 Essential (primary) hypertension: Secondary | ICD-10-CM

## 2017-03-01 DIAGNOSIS — C50412 Malignant neoplasm of upper-outer quadrant of left female breast: Secondary | ICD-10-CM

## 2017-03-01 DIAGNOSIS — Z95828 Presence of other vascular implants and grafts: Secondary | ICD-10-CM

## 2017-03-01 DIAGNOSIS — I5032 Chronic diastolic (congestive) heart failure: Secondary | ICD-10-CM

## 2017-03-01 DIAGNOSIS — Z170 Estrogen receptor positive status [ER+]: Secondary | ICD-10-CM | POA: Diagnosis not present

## 2017-03-01 DIAGNOSIS — Z5111 Encounter for antineoplastic chemotherapy: Secondary | ICD-10-CM | POA: Diagnosis not present

## 2017-03-01 DIAGNOSIS — E1165 Type 2 diabetes mellitus with hyperglycemia: Secondary | ICD-10-CM | POA: Diagnosis not present

## 2017-03-01 LAB — COMPREHENSIVE METABOLIC PANEL
ALBUMIN: 3.2 g/dL — AB (ref 3.5–5.0)
ALK PHOS: 88 U/L (ref 40–150)
AST: 7 U/L (ref 5–34)
Anion Gap: 9 mEq/L (ref 3–11)
BUN: 27.4 mg/dL — AB (ref 7.0–26.0)
CO2: 24 mEq/L (ref 22–29)
Calcium: 9.1 mg/dL (ref 8.4–10.4)
Chloride: 102 mEq/L (ref 98–109)
Creatinine: 1.3 mg/dL — ABNORMAL HIGH (ref 0.6–1.1)
EGFR: 49 mL/min/{1.73_m2} — ABNORMAL LOW (ref >90)
GLUCOSE: 431 mg/dL — AB (ref 70–140)
Potassium: 4.1 mEq/L (ref 3.5–5.1)
SODIUM: 135 meq/L — AB (ref 136–145)
Total Bilirubin: 0.34 mg/dL (ref 0.20–1.20)
Total Protein: 6.7 g/dL (ref 6.4–8.3)

## 2017-03-01 LAB — CBC WITH DIFFERENTIAL/PLATELET
BASO%: 0.4 % (ref 0.0–2.0)
Basophils Absolute: 0 10*3/uL (ref 0.0–0.1)
EOS ABS: 0 10*3/uL (ref 0.0–0.5)
EOS%: 0 % (ref 0.0–7.0)
HCT: 26.3 % — ABNORMAL LOW (ref 34.8–46.6)
HEMOGLOBIN: 9.4 g/dL — AB (ref 11.6–15.9)
LYMPH%: 10.4 % — ABNORMAL LOW (ref 14.0–49.7)
MCH: 29.3 pg (ref 25.1–34.0)
MCHC: 35.8 g/dL (ref 31.5–36.0)
MCV: 81.8 fL (ref 79.5–101.0)
MONO#: 0.4 10*3/uL (ref 0.1–0.9)
MONO%: 5.6 % (ref 0.0–14.0)
NEUT%: 83.6 % — ABNORMAL HIGH (ref 38.4–76.8)
NEUTROS ABS: 5.3 10*3/uL (ref 1.5–6.5)
Platelets: 372 10*3/uL (ref 145–400)
RBC: 3.21 10*6/uL — ABNORMAL LOW (ref 3.70–5.45)
RDW: 16.6 % — AB (ref 11.2–14.5)
WBC: 6.3 10*3/uL (ref 3.9–10.3)
lymph#: 0.7 10*3/uL — ABNORMAL LOW (ref 0.9–3.3)

## 2017-03-01 MED ORDER — DIPHENHYDRAMINE HCL 50 MG/ML IJ SOLN
INTRAMUSCULAR | Status: AC
  Filled 2017-03-01: qty 1

## 2017-03-01 MED ORDER — SODIUM CHLORIDE 0.9 % IV SOLN
Freq: Once | INTRAVENOUS | Status: AC
  Administered 2017-03-01: 12:00:00 via INTRAVENOUS

## 2017-03-01 MED ORDER — PALONOSETRON HCL INJECTION 0.25 MG/5ML
0.2500 mg | Freq: Once | INTRAVENOUS | Status: AC
  Administered 2017-03-01: 0.25 mg via INTRAVENOUS

## 2017-03-01 MED ORDER — PALONOSETRON HCL INJECTION 0.25 MG/5ML
INTRAVENOUS | Status: AC
  Filled 2017-03-01: qty 5

## 2017-03-01 MED ORDER — SODIUM CHLORIDE 0.9 % IV SOLN
600.0000 mg/m2 | Freq: Once | INTRAVENOUS | Status: AC
  Administered 2017-03-01: 1120 mg via INTRAVENOUS
  Filled 2017-03-01: qty 56

## 2017-03-01 MED ORDER — DIPHENHYDRAMINE HCL 50 MG/ML IJ SOLN
25.0000 mg | Freq: Once | INTRAMUSCULAR | Status: AC
  Administered 2017-03-01: 25 mg via INTRAVENOUS

## 2017-03-01 MED ORDER — HEPARIN SOD (PORK) LOCK FLUSH 100 UNIT/ML IV SOLN
500.0000 [IU] | Freq: Once | INTRAVENOUS | Status: AC | PRN
  Administered 2017-03-01: 500 [IU]
  Filled 2017-03-01: qty 5

## 2017-03-01 MED ORDER — DOCETAXEL CHEMO INJECTION 160 MG/16ML
75.0000 mg/m2 | Freq: Once | INTRAVENOUS | Status: AC
  Administered 2017-03-01: 140 mg via INTRAVENOUS
  Filled 2017-03-01: qty 14

## 2017-03-01 MED ORDER — SODIUM CHLORIDE 0.9% FLUSH
10.0000 mL | Freq: Once | INTRAVENOUS | Status: AC
  Administered 2017-03-01: 10 mL
  Filled 2017-03-01: qty 10

## 2017-03-01 MED ORDER — SODIUM CHLORIDE 0.9% FLUSH
10.0000 mL | INTRAVENOUS | Status: DC | PRN
  Administered 2017-03-01: 10 mL
  Filled 2017-03-01: qty 10

## 2017-03-01 MED ORDER — DEXAMETHASONE SODIUM PHOSPHATE 10 MG/ML IJ SOLN
INTRAMUSCULAR | Status: AC
  Filled 2017-03-01: qty 1

## 2017-03-01 MED ORDER — DEXAMETHASONE SODIUM PHOSPHATE 10 MG/ML IJ SOLN
4.0000 mg | Freq: Once | INTRAMUSCULAR | Status: AC
  Administered 2017-03-01: 4 mg via INTRAVENOUS

## 2017-03-01 NOTE — Patient Instructions (Signed)
Cancer Center Discharge Instructions for Patients Receiving Chemotherapy  Today you received the following chemotherapy agents;  Taxotere and Cytoxan.    To help prevent nausea and vomiting after your treatment, we encourage you to take your nausea medication as directed.     If you develop nausea and vomiting that is not controlled by your nausea medication, call the clinic.   BELOW ARE SYMPTOMS THAT SHOULD BE REPORTED IMMEDIATELY:  *FEVER GREATER THAN 100.5 F  *CHILLS WITH OR WITHOUT FEVER  NAUSEA AND VOMITING THAT IS NOT CONTROLLED WITH YOUR NAUSEA MEDICATION  *UNUSUAL SHORTNESS OF BREATH  *UNUSUAL BRUISING OR BLEEDING  TENDERNESS IN MOUTH AND THROAT WITH OR WITHOUT PRESENCE OF ULCERS  *URINARY PROBLEMS  *BOWEL PROBLEMS  UNUSUAL RASH Items with * indicate a potential emergency and should be followed up as soon as possible.  Feel free to call the clinic you have any questions or concerns. The clinic phone number is [phone].  Please show the CHEMO ALERT CARD at check-in to the Emergency Department and triage nurse.   

## 2017-03-01 NOTE — Progress Notes (Signed)
Dr. Burr Medico okay to tx with CBG 431. Pt to discuss increase in blood sugar medication dosage with primary physician.

## 2017-03-02 ENCOUNTER — Other Ambulatory Visit: Payer: Self-pay | Admitting: Emergency Medicine

## 2017-03-02 ENCOUNTER — Telehealth: Payer: Self-pay | Admitting: Internal Medicine

## 2017-03-02 MED ORDER — INSULIN ASPART 100 UNIT/ML ~~LOC~~ SOLN: 5.0000 [IU] | Freq: Two times a day (BID) | SUBCUTANEOUS | 1 refills | Status: DC

## 2017-03-02 NOTE — Telephone Encounter (Signed)
Patient stated that she is on the Lantus however after having her breast chemo the other day, her blood sugar increased and she was advised by the doctor for her Lantus to be increased.   Patient also need the Novolog as a pen.   [id number] [address], [place] [phone] (Phone) [phone] (Fax)   Please call patient to advise once this has been done.

## 2017-03-02 NOTE — Telephone Encounter (Signed)
Novolog refill sent to pharmacy per pt request.

## 2017-03-02 NOTE — Telephone Encounter (Signed)
For now, I would probably continue same dose of Basaglar (OK to refill) but may need to increase her Novolog by 2 units per dose if sugars are high throughout the day. Please call with CBG on Monday.

## 2017-03-02 NOTE — Telephone Encounter (Signed)
Called pt to discuss provider recommendations. Pt verbalized understanding and will call back on Monday with CBG readings.

## 2017-03-03 ENCOUNTER — Ambulatory Visit (HOSPITAL_BASED_OUTPATIENT_CLINIC_OR_DEPARTMENT_OTHER): Payer: Medicare Other

## 2017-03-03 VITALS — BP 124/51 | HR 86 | Temp 98.1°F | Resp 18

## 2017-03-03 DIAGNOSIS — C50412 Malignant neoplasm of upper-outer quadrant of left female breast: Secondary | ICD-10-CM | POA: Diagnosis present

## 2017-03-03 DIAGNOSIS — Z170 Estrogen receptor positive status [ER+]: Principal | ICD-10-CM

## 2017-03-03 DIAGNOSIS — Z5189 Encounter for other specified aftercare: Secondary | ICD-10-CM | POA: Diagnosis not present

## 2017-03-03 MED ORDER — PEGFILGRASTIM 6 MG/0.6ML ~~LOC~~ PSKT: 6.0000 mg | PREFILLED_SYRINGE | Freq: Once | SUBCUTANEOUS | Status: DC

## 2017-03-03 MED ORDER — PEGFILGRASTIM INJECTION 6 MG/0.6ML ~~LOC~~
6.0000 mg | PREFILLED_SYRINGE | Freq: Once | SUBCUTANEOUS | Status: AC
  Administered 2017-03-03: 6 mg via SUBCUTANEOUS

## 2017-03-05 ENCOUNTER — Telehealth: Payer: Self-pay

## 2017-03-05 ENCOUNTER — Telehealth: Payer: Self-pay | Admitting: Internal Medicine

## 2017-03-05 ENCOUNTER — Other Ambulatory Visit: Payer: Self-pay

## 2017-03-05 MED ORDER — INSULIN ASPART 100 UNIT/ML ~~LOC~~ SOLN: 5.0000 [IU] | Freq: Two times a day (BID) | SUBCUTANEOUS | 1 refills | Status: DC

## 2017-03-05 NOTE — Telephone Encounter (Signed)
Pts insurance elapsed and will not be back in effect until July 1, what can she do about the meds she needs

## 2017-03-05 NOTE — Telephone Encounter (Signed)
Called and notified patient and advised that we had a sample of Humalog that she could use until she gets her insurance in July. Patient understood and will come by in the morning to pick it up.

## 2017-03-05 NOTE — Telephone Encounter (Signed)
Attempted to contact patient at both numbers on chart to notify her that we had Humalog vial that sample that she could use until July 1st. No voicemail on either phone to leave a message. Will try to contact patient again.

## 2017-03-05 NOTE — Telephone Encounter (Signed)
Yes

## 2017-03-05 NOTE — Telephone Encounter (Signed)
Patient calling asking about sample medication, and then asked if she was able to use husbands novalog since she takes the same thing. Please call patient back and advise.

## 2017-03-05 NOTE — Telephone Encounter (Signed)
Submitted one vial with one refill to pharmacy.

## 2017-03-05 NOTE — Telephone Encounter (Signed)
Please advise.  We do not have any Novolog sample vials, but we did have a humalog vial. Would this be okay to give?   Thank you!

## 2017-03-05 NOTE — Telephone Encounter (Signed)
Patient returning phone call, transferred call to [place].

## 2017-03-05 NOTE — Addendum Note (Signed)
Addendum  created 03/05/17 1249 by Arti Trang, MD   Sign clinical note    

## 2017-03-05 NOTE — Telephone Encounter (Signed)
**  Remind patient they can make refill requests via MyChart**  Medication refill request (Name & Dosage):  insulin aspart (NOVOLOG) 100 UNIT/ML injection  Preferred pharmacy (Name & Address):   [id number] - [place], [address]   Other comments (if applicable):     Patient  is unable to pay $300 for usual Novolog dosage. Please send one vial which she says will cost $40.

## 2017-03-10 ENCOUNTER — Other Ambulatory Visit: Payer: Self-pay | Admitting: Internal Medicine

## 2017-03-14 ENCOUNTER — Ambulatory Visit (INDEPENDENT_AMBULATORY_CARE_PROVIDER_SITE_OTHER): Payer: Medicare Other | Admitting: Internal Medicine

## 2017-03-14 ENCOUNTER — Other Ambulatory Visit (INDEPENDENT_AMBULATORY_CARE_PROVIDER_SITE_OTHER): Payer: Medicare Other

## 2017-03-14 ENCOUNTER — Encounter: Payer: Self-pay | Admitting: Internal Medicine

## 2017-03-14 ENCOUNTER — Other Ambulatory Visit: Payer: Self-pay | Admitting: Internal Medicine

## 2017-03-14 ENCOUNTER — Ambulatory Visit (INDEPENDENT_AMBULATORY_CARE_PROVIDER_SITE_OTHER)
Admission: RE | Admit: 2017-03-14 | Discharge: 2017-03-14 | Disposition: A | Discharge status: Home / Self Care | Payer: Medicare Other | Source: Ambulatory Visit | Attending: Internal Medicine | Admitting: Internal Medicine

## 2017-03-14 VITALS — BP 136/58 | HR 83 | Temp 98.7°F | Resp 16 | Wt 179.0 lb

## 2017-03-14 DIAGNOSIS — I10 Essential (primary) hypertension: Secondary | ICD-10-CM

## 2017-03-14 DIAGNOSIS — R0609 Other forms of dyspnea: Secondary | ICD-10-CM

## 2017-03-14 DIAGNOSIS — R0602 Shortness of breath: Secondary | ICD-10-CM | POA: Insufficient documentation

## 2017-03-14 DIAGNOSIS — R609 Edema, unspecified: Secondary | ICD-10-CM

## 2017-03-14 DIAGNOSIS — R0600 Dyspnea, unspecified: Secondary | ICD-10-CM | POA: Diagnosis not present

## 2017-03-14 DIAGNOSIS — R5383 Other fatigue: Secondary | ICD-10-CM | POA: Insufficient documentation

## 2017-03-14 LAB — COMPREHENSIVE METABOLIC PANEL
ALT: 4 U/L (ref 0–35)
AST: 8 U/L (ref 0–37)
Albumin: 3.8 g/dL (ref 3.5–5.2)
Alkaline Phosphatase: 113 U/L (ref 39–117)
BUN: 19 mg/dL (ref 6–23)
CALCIUM: 9.4 mg/dL (ref 8.4–10.5)
CHLORIDE: 101 meq/L (ref 96–112)
CO2: 29 meq/L (ref 19–32)
CREATININE: 0.97 mg/dL (ref 0.40–1.20)
GFR: 71.86 mL/min (ref >60.00)
Glucose, Bld: 161 mg/dL — ABNORMAL HIGH (ref 70–99)
POTASSIUM: 4.7 meq/L (ref 3.5–5.1)
Sodium: 136 mEq/L (ref 135–145)
Total Bilirubin: 0.3 mg/dL (ref 0.2–1.2)
Total Protein: 6.9 g/dL (ref 6.0–8.3)

## 2017-03-14 LAB — CBC WITH DIFFERENTIAL/PLATELET
BASOS PCT: 0.8 % (ref 0.0–3.0)
Basophils Absolute: 0.2 10*3/uL — ABNORMAL HIGH (ref 0.0–0.1)
EOS PCT: 0.1 % (ref 0.0–5.0)
Eosinophils Absolute: 0 10*3/uL (ref 0.0–0.7)
HEMATOCRIT: 28.7 % — AB (ref 36.0–46.0)
HEMOGLOBIN: 9.8 g/dL — AB (ref 12.0–15.0)
LYMPHS PCT: 5.3 % — AB (ref 12.0–46.0)
Lymphs Abs: 1.2 10*3/uL (ref 0.7–4.0)
MCHC: 34.1 g/dL (ref 30.0–36.0)
MCV: 81 fl (ref 78.0–100.0)
MONOS PCT: 5.4 % (ref 3.0–12.0)
Monocytes Absolute: 1.2 10*3/uL — ABNORMAL HIGH (ref 0.1–1.0)
NEUTROS ABS: 20 10*3/uL — AB (ref 1.4–7.7)
Neutrophils Relative %: 88.4 % — ABNORMAL HIGH (ref 43.0–77.0)
PLATELETS: 267 10*3/uL (ref 150.0–400.0)
RBC: 3.54 Mil/uL — ABNORMAL LOW (ref 3.87–5.11)
RDW: 17.7 % — AB (ref 11.5–15.5)
WBC: 22.6 10*3/uL (ref 4.0–10.5)

## 2017-03-14 LAB — BRAIN NATRIURETIC PEPTIDE: Pro B Natriuretic peptide (BNP): 152 pg/mL — ABNORMAL HIGH (ref 0.0–100.0)

## 2017-03-14 MED ORDER — CEFDINIR 300 MG PO CAPS: 300.0000 mg | ORAL_CAPSULE | Freq: Two times a day (BID) | ORAL | 0 refills | Status: DC

## 2017-03-14 NOTE — Assessment & Plan Note (Signed)
Present for a few day.  Associated with increased edema - mild in nature and leg weakness - ? Diastolic heart failure No cough or wheeze or cold symptoms - - will r/o lung infection which seems unlikely Recent blood work stable  - unlikely worsening of anemia Check cbc, cmp, bnp cxr today

## 2017-03-14 NOTE — Patient Instructions (Signed)
Blood work and chest x-ray was ordered -   Test(s) ordered today. Your results will be released to [place] (or called to you) after review, usually within 72hours after test completion. If any changes need to be made, you will be notified at that same time.  Monitor your BP and sugar at home closely and write it down.   Medications reviewed and updated.  No changes recommended at this time.    Please followup if your symptoms do not improve.

## 2017-03-14 NOTE — Assessment & Plan Note (Signed)
Slight worsening of leg edema ,which is mild on exam Has had increased salt intake over the weekend - ? Uncompensated diastolic heart failure Continue hctz dose Avoid salt Elevate legs Check bnp, cmp

## 2017-03-14 NOTE — Progress Notes (Signed)
Subjective:    Patient ID: Wendy [name], female    DOB: 1941-04-23, 76 y.o.   MRN: [id number]  HPI She is here for an acute visit.   Last weekend she started to feel short of breath with exertion.   She denies any SOB at rest.  She denies cough, wheeze or cold symptoms.  She used to have an inhaler, but has not needed to use it in years. She is not sure if she needs one or not.  When she was walking around her house her legs also felt weak.  After her injection (neulasta) with chemo she has achiness in her legs - her last injection was 03/03/17.    She has not been exerciseing. She has been more active with taking care of her husband who just got home  - he is very sick and needs a lot of help.    Her sugars have been 110 in the morning and sometimes 200 after dinner.  She denies any low blood pressures, but does not check it regularly.   Her legs have been slightly more swollen since over the weekend.  She did have some country ham over the weekend.    She denies fevers or chills.  She has alternating constipation and diarrhea - that is not new.  She denies abdominal pain.  She denies dysuria, hematuria and increased urinary frequency.    Medications and allergies reviewed with patient and updated if appropriate.  Patient Active Problem List   Diagnosis Date Noted  . Port catheter in place 01/18/2017  . Rectal bleeding 01/17/2017  . Constipation 01/17/2017  . Malignant neoplasm of upper-outer quadrant of left breast in female, estrogen receptor positive ([place]) 11/20/2016  . Left upper arm pain 11/03/2016  . CKD (chronic kidney disease) stage 3, GFR 30-59 ml/min 03/17/2016  . Osteopenia 01/05/2016  . 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 Seasonal allergies 12/01/2013  . Chronic diastolic heart failure ([place]) 07/06/2011  . TOBACCO USE 04/13/2008  . Hyperlipidemia  11/29/2006  . OBESITY, BMI 30-35 11/29/2006  . HYPERTENSION, BENIGN SYSTEMIC 11/29/2006    Current Outpatient Prescriptions on File Prior to Visit  Medication Sig Dispense Refill  . amLODipine (NORVASC) 10 MG tablet TAKE 1 TABLET (10 MG TOTAL) BY MOUTH DAILY. 30 tablet 5  . amoxicillin-clavulanate (AUGMENTIN) 875-125 MG tablet Take 1 tablet by mouth 2 (two) times daily. 14 tablet 0  . aspirin 81 MG tablet Take 81 mg by mouth daily.      . B-D INS SYRINGE 0.5CC/31GX5/16 31G X 5/16" 0.5 ML MISC USE AS DIRECTED 4 TIMES A DAY 300 each 1  . carvedilol (COREG) 25 MG tablet TAKE 1 TABLET BY MOUTH TWICE A DAY WITH A MEAL 180 tablet 1  . cloNIDine (CATAPRES) 0.3 MG tablet Take 0.3 mg by mouth 2 (two) times daily.    Marland Kitchen dexamethasone (DECADRON) 4 MG tablet Take 2 tablets (8 mg total) by mouth 2 (two) times daily. Start the day before Taxotere. Then again the day after chemo for 3 days. 30 tablet 1  . hydrochlorothiazide (HYDRODIURIL) 25 MG tablet Take 1 tablet (25 mg total) by mouth daily. 30 tablet 5  . HYDROcodone-acetaminophen (NORCO/VICODIN) 5-325 MG tablet Take 1-2 tablets by mouth every 6 (six) hours as needed for moderate pain. 20 tablet 0  . insulin aspart (NOVOLOG) 100 UNIT/ML injection Inject 5-6 Units into the skin  2 (two) times daily with a meal. 10 mL 1  . Insulin Glargine (BASAGLAR KWIKPEN) 100 UNIT/ML SOPN Inject 24 Units into the skin every morning.   3  . Insulin Pen Needle 31G X 5 MM MISC Use pen needles for insulin injection daily 100 each 3  . Insulin Syringe-Needle U-100 (B-D INS SYRINGE 0.5CC/30GX1/2") 30G X 1/2" 0.5 ML MISC USE AS DIRECTED 4 TIMES A DAY 300 each 3  . lidocaine-prilocaine (EMLA) cream Apply to affected area once 30 g 3  . lisinopril (PRINIVIL,ZESTRIL) 40 MG tablet TAKE 1 TABLET (40 MG TOTAL) BY MOUTH DAILY. 30 tablet 5  . metFORMIN (GLUCOPHAGE) 500 MG tablet Take 500 mg by mouth 2 (two) times daily with a meal.    . ondansetron (ZOFRAN) 8 MG tablet Take 1 tablet (8  mg total) by mouth 2 (two) times daily as needed for refractory nausea / vomiting. Start on day 3 after chemo. 30 tablet 1  . prochlorperazine (COMPAZINE) 10 MG tablet Take 1 tablet (10 mg total) by mouth every 6 (six) hours as needed (Nausea or vomiting). 30 tablet 1  . rosuvastatin (CRESTOR) 5 MG tablet Take 1 tablet (5 mg total) by mouth daily. -- need labs for further refills 90 tablet 0  . traMADol (ULTRAM) 50 MG tablet Take 1 tablet (50 mg total) by mouth every 6 (six) hours as needed. 20 tablet 0   No current facility-administered medications on file prior to visit.     Past Medical History:  Diagnosis Date  . Breast cancer ([place]) 11/2016   left  . GERD (gastroesophageal reflux disease)    TUMS as needed  . HTN (hypertension)    states BP has been high recently; has been on med. x 20 yr.  . Hyperlipidemia   . Insulin dependent diabetes mellitus ([place])     Past Surgical History:  Procedure Laterality Date  . BREAST LUMPECTOMY Left 05/05/2008  . BREAST LUMPECTOMY WITH RADIOACTIVE SEED AND SENTINEL LYMPH NODE BIOPSY Left 12/07/2016   Procedure: LEFT BREAST LUMPECTOMY WITH RADIOACTIVE SEED AND SENTINEL LYMPH NODE BIOPSY;  Surgeon: Erroll Luna, MD;  Location: [facility];  Service: General;  Laterality: Left;  . IR FLUORO GUIDE PORT INSERTION RIGHT  01/04/2017  . IR US GUIDE VASC ACCESS RIGHT  01/04/2017  . RE-EXCISION OF BREAST LUMPECTOMY Left 12/28/2016   Procedure: RE-EXCISION OF BREAST LUMPECTOMY;  Surgeon: Erroll Luna, MD;  Location: [facility] OR;  Service: General;  Laterality: Left;    Social History   Social History  . Marital status: Married    Spouse name: N/A  . Number of children: 4  . Years of education: N/A   Occupational History  . [employer]   Social History Main Topics  . Smoking status: Never Smoker  . Smokeless tobacco: Current User    Types: Snuff  . Alcohol use No  . Drug use: No  . Sexual activity: Not Currently   Other  Topics Concern  . Not on file   Social History Narrative  . No narrative on file    Family History  Problem Relation Age of Onset  . Diabetes Sister        x 4  . Diabetes Brother   . Diabetes Paternal Grandmother   . Diabetes Paternal Aunt   . Heart disease Brother   . Heart disease Sister   . Emphysema Brother   . Cancer Father 78       brain  tumor     Review of Systems  Constitutional: Positive for appetite change (decreased, but eating). Negative for chills and fever.  HENT: Negative for congestion, ear pain, sinus pain, sinus pressure and sore throat.   Respiratory: Positive for shortness of breath (with exertion). Negative for cough and wheezing.   Cardiovascular: Positive for palpitations (when she walk sometimes) and leg swelling (b/l ankles). Negative for chest pain.  Gastrointestinal: Positive for constipation and diarrhea (alternating). Negative for abdominal pain, blood in stool and nausea.  Genitourinary: Negative for dysuria, frequency and hematuria.  Neurological: Negative for dizziness, light-headedness and headaches.       Objective:   Vitals:   03/14/17 1437  BP: (!) 136/58  Pulse: 83  Resp: 16  Temp: 98.7 F (37.1 C)   Filed Weights   03/14/17 1437  Weight: 179 lb (81.2 kg)   Body mass index is 33.27 kg/m.  Wt Readings from Last 3 Encounters:  03/14/17 179 lb (81.2 kg)  03/01/17 177 lb 4.8 oz (80.4 kg)  02/08/17 177 lb 12.8 oz (80.6 kg)     Physical Exam GENERAL APPEARANCE: Appears stated age, well appearing, NAD EYES: conjunctiva clear, no icterus HEENT: bilateral tympanic membranes and ear canals normal, oropharynx with no erythema, no thyromegaly, trachea midline, no cervical or supraclavicular lymphadenopathy LUNGS: Clear to auscultation without wheeze or crackles, unlabored breathing, good air entry bilaterally HEART/ VASCULAR: Normal S1,S2 without murmurs; mild b/l LE edema - non-pitting Abdomen: soft, non tender, non  distended Skin: warm, dry, no rash       Assessment & Plan:   See Problem List for Assessment and Plan of chronic medical problems.

## 2017-03-14 NOTE — Assessment & Plan Note (Signed)
?   Cancer, increased activity with husband coming home, infection, heart failure Check labs Monitor closely

## 2017-03-14 NOTE — Assessment & Plan Note (Signed)
BP conrtrolled Continue current medication

## 2017-03-22 ENCOUNTER — Ambulatory Visit (HOSPITAL_BASED_OUTPATIENT_CLINIC_OR_DEPARTMENT_OTHER): Payer: Medicare Other

## 2017-03-22 ENCOUNTER — Other Ambulatory Visit (HOSPITAL_BASED_OUTPATIENT_CLINIC_OR_DEPARTMENT_OTHER): Payer: Medicare Other

## 2017-03-22 ENCOUNTER — Ambulatory Visit: Payer: Medicare Other

## 2017-03-22 ENCOUNTER — Ambulatory Visit (HOSPITAL_BASED_OUTPATIENT_CLINIC_OR_DEPARTMENT_OTHER): Payer: Medicare Other | Admitting: Nurse Practitioner

## 2017-03-22 ENCOUNTER — Encounter: Payer: Self-pay | Admitting: *Deleted

## 2017-03-22 VITALS — BP 152/64 | HR 89 | Temp 98.6°F | Resp 18 | Ht 61.5 in | Wt 173.7 lb

## 2017-03-22 DIAGNOSIS — N183 Chronic kidney disease, stage 3 (moderate): Secondary | ICD-10-CM

## 2017-03-22 DIAGNOSIS — C50412 Malignant neoplasm of upper-outer quadrant of left female breast: Secondary | ICD-10-CM

## 2017-03-22 DIAGNOSIS — E119 Type 2 diabetes mellitus without complications: Secondary | ICD-10-CM | POA: Diagnosis not present

## 2017-03-22 DIAGNOSIS — I10 Essential (primary) hypertension: Secondary | ICD-10-CM | POA: Diagnosis not present

## 2017-03-22 DIAGNOSIS — Z170 Estrogen receptor positive status [ER+]: Principal | ICD-10-CM

## 2017-03-22 DIAGNOSIS — Z5111 Encounter for antineoplastic chemotherapy: Secondary | ICD-10-CM

## 2017-03-22 DIAGNOSIS — IMO0001 Reserved for inherently not codable concepts without codable children: Secondary | ICD-10-CM

## 2017-03-22 DIAGNOSIS — Z95828 Presence of other vascular implants and grafts: Secondary | ICD-10-CM

## 2017-03-22 DIAGNOSIS — E1165 Type 2 diabetes mellitus with hyperglycemia: Secondary | ICD-10-CM

## 2017-03-22 LAB — COMPREHENSIVE METABOLIC PANEL
ALBUMIN: 3.5 g/dL (ref 3.5–5.0)
ALK PHOS: 80 U/L (ref 40–150)
ALT: 9 U/L (ref 0–55)
AST: 10 U/L (ref 5–34)
Anion Gap: 10 mEq/L (ref 3–11)
BILIRUBIN TOTAL: 0.45 mg/dL (ref 0.20–1.20)
BUN: 30.1 mg/dL — AB (ref 7.0–26.0)
CO2: 25 mEq/L (ref 22–29)
CREATININE: 1.3 mg/dL — AB (ref 0.6–1.1)
Calcium: 9.6 mg/dL (ref 8.4–10.4)
Chloride: 99 mEq/L (ref 98–109)
EGFR: 48 mL/min/{1.73_m2} — AB (ref >90)
GLUCOSE: 413 mg/dL — AB (ref 70–140)
Potassium: 4 mEq/L (ref 3.5–5.1)
SODIUM: 134 meq/L — AB (ref 136–145)
TOTAL PROTEIN: 6.8 g/dL (ref 6.4–8.3)

## 2017-03-22 LAB — CBC WITH DIFFERENTIAL/PLATELET
BASO%: 0.4 % (ref 0.0–2.0)
Basophils Absolute: 0 10*3/uL (ref 0.0–0.1)
EOS ABS: 0 10*3/uL (ref 0.0–0.5)
EOS%: 0 % (ref 0.0–7.0)
HCT: 26.7 % — ABNORMAL LOW (ref 34.8–46.6)
HEMOGLOBIN: 9.2 g/dL — AB (ref 11.6–15.9)
LYMPH%: 13.5 % — ABNORMAL LOW (ref 14.0–49.7)
MCH: 28.1 pg (ref 25.1–34.0)
MCHC: 34.4 g/dL (ref 31.5–36.0)
MCV: 81.8 fL (ref 79.5–101.0)
MONO#: 0.5 10*3/uL (ref 0.1–0.9)
MONO%: 9.1 % (ref 0.0–14.0)
NEUT%: 77 % — ABNORMAL HIGH (ref 38.4–76.8)
NEUTROS ABS: 4.5 10*3/uL (ref 1.5–6.5)
Platelets: 301 10*3/uL (ref 145–400)
RBC: 3.27 10*6/uL — ABNORMAL LOW (ref 3.70–5.45)
RDW: 18.5 % — ABNORMAL HIGH (ref 11.2–14.5)
WBC: 5.9 10*3/uL (ref 3.9–10.3)
lymph#: 0.8 10*3/uL — ABNORMAL LOW (ref 0.9–3.3)

## 2017-03-22 MED ORDER — DIPHENHYDRAMINE HCL 50 MG/ML IJ SOLN
INTRAMUSCULAR | Status: AC
  Filled 2017-03-22: qty 1

## 2017-03-22 MED ORDER — DEXAMETHASONE SODIUM PHOSPHATE 10 MG/ML IJ SOLN
4.0000 mg | Freq: Once | INTRAMUSCULAR | Status: AC
  Administered 2017-03-22: 4 mg via INTRAVENOUS

## 2017-03-22 MED ORDER — DIPHENHYDRAMINE HCL 50 MG/ML IJ SOLN
25.0000 mg | Freq: Once | INTRAMUSCULAR | Status: AC
  Administered 2017-03-22: 25 mg via INTRAVENOUS

## 2017-03-22 MED ORDER — SODIUM CHLORIDE 0.9% FLUSH
10.0000 mL | INTRAVENOUS | Status: DC | PRN
  Administered 2017-03-22: 10 mL via INTRAVENOUS
  Filled 2017-03-22: qty 10

## 2017-03-22 MED ORDER — DEXAMETHASONE SODIUM PHOSPHATE 10 MG/ML IJ SOLN
INTRAMUSCULAR | Status: AC
  Filled 2017-03-22: qty 1

## 2017-03-22 MED ORDER — SODIUM CHLORIDE 0.9 % IV SOLN
75.0000 mg/m2 | Freq: Once | INTRAVENOUS | Status: AC
  Administered 2017-03-22: 140 mg via INTRAVENOUS
  Filled 2017-03-22: qty 14

## 2017-03-22 MED ORDER — PALONOSETRON HCL INJECTION 0.25 MG/5ML
INTRAVENOUS | Status: AC
  Filled 2017-03-22: qty 5

## 2017-03-22 MED ORDER — HEPARIN SOD (PORK) LOCK FLUSH 100 UNIT/ML IV SOLN
500.0000 [IU] | Freq: Once | INTRAVENOUS | Status: AC | PRN
  Administered 2017-03-22: 500 [IU]
  Filled 2017-03-22: qty 5

## 2017-03-22 MED ORDER — SODIUM CHLORIDE 0.9% FLUSH
10.0000 mL | INTRAVENOUS | Status: DC | PRN
  Administered 2017-03-22: 10 mL
  Filled 2017-03-22: qty 10

## 2017-03-22 MED ORDER — SODIUM CHLORIDE 0.9 % IV SOLN
Freq: Once | INTRAVENOUS | Status: AC
  Administered 2017-03-22: 12:00:00 via INTRAVENOUS

## 2017-03-22 MED ORDER — SODIUM CHLORIDE 0.9 % IV SOLN
600.0000 mg/m2 | Freq: Once | INTRAVENOUS | Status: AC
  Administered 2017-03-22: 1120 mg via INTRAVENOUS
  Filled 2017-03-22: qty 56

## 2017-03-22 MED ORDER — PALONOSETRON HCL INJECTION 0.25 MG/5ML
0.2500 mg | Freq: Once | INTRAVENOUS | Status: AC
  Administered 2017-03-22: 0.25 mg via INTRAVENOUS

## 2017-03-22 NOTE — Patient Instructions (Signed)
Implanted Port Home Guide An implanted port is a type of central line that is placed under the skin. Central lines are used to provide IV access when treatment or nutrition needs to be given through a person's veins. Implanted ports are used for long-term IV access. An implanted port may be placed because:  You need IV medicine that would be irritating to the small veins in your hands or arms.  You need long-term IV medicines, such as antibiotics.  You need IV nutrition for a long period.  You need frequent blood draws for lab tests.  You need dialysis.  Implanted ports are usually placed in the chest area, but they can also be placed in the upper arm, the abdomen, or the leg. An implanted port has two main parts:  Reservoir. The reservoir is round and will appear as a small, raised area under your skin. The reservoir is the part where a needle is inserted to give medicines or draw blood.  Catheter. The catheter is a thin, flexible tube that extends from the reservoir. The catheter is placed into a large vein. Medicine that is inserted into the reservoir goes into the catheter and then into the vein.  How will I care for my incision site? Do not get the incision site wet. Bathe or shower as directed by your health care provider. How is my port accessed? Special steps must be taken to access the port:  Before the port is accessed, a numbing cream can be placed on the skin. This helps numb the skin over the port site.  Your health care provider uses a sterile technique to access the port. ? Your health care provider must put on a mask and sterile gloves. ? The skin over your port is cleaned carefully with an antiseptic and allowed to dry. ? The port is gently pinched between sterile gloves, and a needle is inserted into the port.  Only "non-coring" port needles should be used to access the port. Once the port is accessed, a blood return should be checked. This helps ensure that the port  is in the vein and is not clogged.  If your port needs to remain accessed for a constant infusion, a clear (transparent) bandage will be placed over the needle site. The bandage and needle will need to be changed every week, or as directed by your health care provider.  Keep the bandage covering the needle clean and dry. Do not get it wet. Follow your health care provider's instructions on how to take a shower or bath while the port is accessed.  If your port does not need to stay accessed, no bandage is needed over the port.  What is flushing? Flushing helps keep the port from getting clogged. Follow your health care provider's instructions on how and when to flush the port. Ports are usually flushed with saline solution or a medicine called heparin. The need for flushing will depend on how the port is used.  If the port is used for intermittent medicines or blood draws, the port will need to be flushed: ? After medicines have been given. ? After blood has been drawn. ? As part of routine maintenance.  If a constant infusion is running, the port may not need to be flushed.  How long will my port stay implanted? The port can stay in for as long as your health care provider thinks it is needed. When it is time for the port to come out, surgery will be   done to remove it. The procedure is similar to the one performed when the port was put in. When should I seek immediate medical care? When you have an implanted port, you should seek immediate medical care if:  You notice a bad smell coming from the incision site.  You have swelling, redness, or drainage at the incision site.  You have more swelling or pain at the port site or the surrounding area.  You have a fever that is not controlled with medicine.  This information is not intended to replace advice given to you by your health care provider. Make sure you discuss any questions you have with your health care provider. Document  Released: 09/18/2005 Document Revised: 02/24/2016 Document Reviewed: 05/26/2013 Elsevier Interactive Patient Education  2017 Elsevier Inc.  

## 2017-03-22 NOTE — Progress Notes (Signed)
Met with patient in infusion room advised to take only 1 decadron tablet twice a day following chemotherapy today. Patient will monitor glucose closely for the next few days and call her PCP with concerns for insulin dosages. Patient verbalized an understanding and advised she had a large breakfast this morning including a smoothie. She states "I am not surprised it is so high, I did not eat well last night or this morning."  Patient repeated back decadron instructions and to call this office with any concerns or questions.

## 2017-03-22 NOTE — Progress Notes (Signed)
  [facility] OFFICE PROGRESS NOTE   Diagnosis:  Left breast cancer  INTERVAL HISTORY:   Ms. Grivas returns as scheduled. She completed cycle 3 Taxotere/Cytoxan 03/01/2017. She denies nausea/vomiting. No mouth sores. Last week she had some loose stools. Bowel habits have since returned to baseline. No consistent numbness or tingling in her hands or feet. She reports a good appetite. She continues to have bone pain following Neulasta but reports the discomfort is well controlled with tramadol.  Objective:  Vital signs in last 24 hours:  Blood pressure (!) 152/64, pulse 89, temperature 98.6 F (37 C), temperature source Oral, resp. rate 18, height 5' 1.5" (1.562 m), weight 173 lb 11.2 oz (78.8 kg), SpO2 100 %.    HEENT: White coating over tongue. No buccal thrush. Resp: Lungs clear bilaterally. Cardio: Regular rate and rhythm. GI: Abdomen soft and nontender. No hepatomegaly. Vascular: No leg edema. Calves soft and nontender. Neuro: Alert and oriented.  Skin: No rash. Port-A-Cath without erythema.    Lab Results:  Lab Results  Component Value Date   WBC 5.9 03/22/2017   HGB 9.2 (L) 03/22/2017   HCT 26.7 (L) 03/22/2017   MCV 81.8 03/22/2017   PLT 301 03/22/2017   NEUTROABS 4.5 03/22/2017    Imaging:  No results found.  Medications: I have reviewed the patient's current medications.  Assessment/Plan: 1. Malignant neoplasm of upper-outer quadrant of left breast, Invasive Ductal Carcinoma, pT2pN1M0, stage IIB, ER+/PR-/HER2-, G3, mammaprint high risk; status post 3 cycles of Taxotere/Cytoxan 2. Hypertension and diabetes 3. CK D stage III   Disposition:Ms. Marinaro appears stable. She has completed 3 cycles of adjuvant Taxotere/Cytoxan. Plan to proceed with the fourth and final cycle today as scheduled.  Blood sugar is again high on today's labs, 413, similar to 3 weeks ago when it was 431. Dexamethasone premedication dose was reduced to 4 mg 3 weeks ago. We  will instruct her to decrease the dexamethasone postchemotherapy dose to one tablet twice a day for 3 days. She will monitor her blood sugars closely and contact her PCP with persistent elevation, instructions regarding insulin dose.  She will return for a follow-up visit with Dr. Burr Medico in one month. She will contact the office in the interim with any problems.    Ned Card ANP/GNP-BC   03/22/2017  11:13 AM

## 2017-03-22 NOTE — Patient Instructions (Signed)
[facility] Discharge Instructions for Patients Receiving Chemotherapy  Today you received the following chemotherapy agents Taxotere and Cytoxan.  To help prevent nausea and vomiting after your treatment, we encourage you to take your nausea medication.   If you develop nausea and vomiting that is not controlled by your nausea medication, call the clinic.   BELOW ARE SYMPTOMS THAT SHOULD BE REPORTED IMMEDIATELY:  *FEVER GREATER THAN 100.5 F  *CHILLS WITH OR WITHOUT FEVER  NAUSEA AND VOMITING THAT IS NOT CONTROLLED WITH YOUR NAUSEA MEDICATION  *UNUSUAL SHORTNESS OF BREATH  *UNUSUAL BRUISING OR BLEEDING  TENDERNESS IN MOUTH AND THROAT WITH OR WITHOUT PRESENCE OF ULCERS  *URINARY PROBLEMS  *BOWEL PROBLEMS  UNUSUAL RASH Items with * indicate a potential emergency and should be followed up as soon as possible.  Feel free to call the clinic you have any questions or concerns. The clinic phone number is [phone].  Please show the [facility] at check-in to the Emergency Department and triage nurse.

## 2017-03-23 LAB — CANCER ANTIGEN 27.29: CAN 27.29: 31.3 U/mL (ref 0.0–38.6)

## 2017-03-24 ENCOUNTER — Ambulatory Visit (HOSPITAL_BASED_OUTPATIENT_CLINIC_OR_DEPARTMENT_OTHER): Payer: Medicare Other

## 2017-03-24 VITALS — BP 162/62 | HR 81 | Temp 98.5°F | Resp 17

## 2017-03-24 DIAGNOSIS — Z170 Estrogen receptor positive status [ER+]: Principal | ICD-10-CM

## 2017-03-24 DIAGNOSIS — Z5189 Encounter for other specified aftercare: Secondary | ICD-10-CM | POA: Diagnosis not present

## 2017-03-24 DIAGNOSIS — C50412 Malignant neoplasm of upper-outer quadrant of left female breast: Secondary | ICD-10-CM | POA: Diagnosis present

## 2017-03-24 MED ORDER — PEGFILGRASTIM INJECTION 6 MG/0.6ML ~~LOC~~
6.0000 mg | PREFILLED_SYRINGE | Freq: Once | SUBCUTANEOUS | Status: AC
  Administered 2017-03-24: 6 mg via SUBCUTANEOUS

## 2017-03-24 NOTE — Patient Instructions (Signed)
Pegfilgrastim injection What is this medicine? PEGFILGRASTIM (PEG fil gra stim) is a long-acting granulocyte colony-stimulating factor that stimulates the growth of neutrophils, a type of white blood cell important in the body's fight against infection. It is used to reduce the incidence of fever and infection in patients with certain types of cancer who are receiving chemotherapy that affects the bone marrow, and to increase survival after being exposed to high doses of radiation. This medicine may be used for other purposes; ask your health care provider or pharmacist if you have questions. COMMON BRAND NAME(S): Neulasta What should I tell my health care provider before I take this medicine? They need to know if you have any of these conditions: -kidney disease -latex allergy -ongoing radiation therapy -sickle cell disease -skin reactions to acrylic adhesives (On-Body Injector only) -an unusual or allergic reaction to pegfilgrastim, filgrastim, other medicines, foods, dyes, or preservatives -pregnant or trying to get pregnant -breast-feeding How should I use this medicine? This medicine is for injection under the skin. If you get this medicine at home, you will be taught how to prepare and give the pre-filled syringe or how to use the On-body Injector. Refer to the patient Instructions for Use for detailed instructions. Use exactly as directed. Tell your healthcare provider immediately if you suspect that the On-body Injector may not have performed as intended or if you suspect the use of the On-body Injector resulted in a missed or partial dose. It is important that you put your used needles and syringes in a special sharps container. Do not put them in a trash can. If you do not have a sharps container, call your pharmacist or healthcare provider to get one. Talk to your pediatrician regarding the use of this medicine in children. While this drug may be prescribed for selected conditions,  precautions do apply. Overdosage: If you think you have taken too much of this medicine contact a poison control center or emergency room at once. NOTE: This medicine is only for you. Do not share this medicine with others. What if I miss a dose? It is important not to miss your dose. Call your doctor or health care professional if you miss your dose. If you miss a dose due to an On-body Injector failure or leakage, a new dose should be administered as soon as possible using a single prefilled syringe for manual use. What may interact with this medicine? Interactions have not been studied. Give your health care provider a list of all the medicines, herbs, non-prescription drugs, or dietary supplements you use. Also tell them if you smoke, drink alcohol, or use illegal drugs. Some items may interact with your medicine. This list may not describe all possible interactions. Give your health care provider a list of all the medicines, herbs, non-prescription drugs, or dietary supplements you use. Also tell them if you smoke, drink alcohol, or use illegal drugs. Some items may interact with your medicine. What should I watch for while using this medicine? You may need blood work done while you are taking this medicine. If you are going to need a MRI, CT scan, or other procedure, tell your doctor that you are using this medicine (On-Body Injector only). What side effects may I notice from receiving this medicine? Side effects that you should report to your doctor or health care professional as soon as possible: -allergic reactions like skin rash, itching or hives, swelling of the face, lips, or tongue -dizziness -fever -pain, redness, or irritation at site   where injected -pinpoint red spots on the skin -red or dark-brown urine -shortness of breath or breathing problems -stomach or side pain, or pain at the shoulder -swelling -tiredness -trouble passing urine or change in the amount of urine Side  effects that usually do not require medical attention (report to your doctor or health care professional if they continue or are bothersome): -bone pain -muscle pain This list may not describe all possible side effects. Call your doctor for medical advice about side effects. You may report side effects to FDA at 1-800-FDA-1088. Where should I keep my medicine? Keep out of the reach of children. Store pre-filled syringes in a refrigerator between 2 and 8 degrees C (36 and 46 degrees F). Do not freeze. Keep in carton to protect from light. Throw away this medicine if it is left out of the refrigerator for more than 48 hours. Throw away any unused medicine after the expiration date. NOTE: This sheet is a summary. It may not cover all possible information. If you have questions about this medicine, talk to your doctor, pharmacist, or health care provider.  2018 Elsevier/Gold Standard (2016-09-14 12:58:03)  

## 2017-03-28 ENCOUNTER — Telehealth: Payer: Self-pay | Admitting: Hematology

## 2017-03-28 NOTE — Telephone Encounter (Signed)
Appointments scheduled and confirmed with patient, per 03/28/17 los.

## 2017-03-30 ENCOUNTER — Encounter: Payer: Self-pay | Admitting: Radiation Oncology

## 2017-04-02 NOTE — Progress Notes (Signed)
Follow Up New Consult  Breast Left Ductal Carcinoma,   Initial Biopsy 2/6/218: left breast 1 o'clock position  3/8/218: Left Lumpectomy and left axillary sentinel  Lymph -node Dr. Donnal Moat  12/28/16:Re-excision of previous positive superior margin was negative for malignant cells   Assessment/Plan: Medical Oncology:  1. Malignant neoplasm of upper-outer quadrant of left breast, Invasive Ductal Carcinoma, pT2pN1M0, stage IIB, ER+/PR-/HER2-, G3, mammaprint high risk; status post 3 cycles of Taxotere/Cytoxan 2. Hypertension and diabetes 3. CK D stage III   Disposition:Ms. Barrington appears stable. She has completed 3 cycles of adjuvant Taxotere/Cytoxan. Plan to proceed with the fourth and final cycle today  03/22/2017 as scheduled.   Married, menarche age 14, G80P4,   No c/o pin, nausea, appetrite good, BP (!) 114/49 Comment: right arm sitting  Pulse 73   Temp 98.5 F (36.9 C) (Oral)   Resp 18   Ht 5' 1.5" (1.562 m)   Wt 179 lb 6.4 oz (81.4 kg)   BMI 33.35 kg/m   Wt Readings from Last 3 Encounters:  04/05/17 179 lb 6.4 oz (81.4 kg)  03/22/17 173 lb 11.2 oz (78.8 kg)  03/14/17 179 lb (81.2 kg)

## 2017-04-05 ENCOUNTER — Encounter: Payer: Self-pay | Admitting: Radiation Oncology

## 2017-04-05 ENCOUNTER — Ambulatory Visit
Admission: RE | Admit: 2017-04-05 | Discharge: 2017-04-05 | Disposition: A | Discharge status: Home / Self Care | Payer: Medicare Other | Source: Ambulatory Visit | Attending: Radiation Oncology | Admitting: Radiation Oncology

## 2017-04-05 DIAGNOSIS — Z170 Estrogen receptor positive status [ER+]: Secondary | ICD-10-CM

## 2017-04-05 DIAGNOSIS — C50412 Malignant neoplasm of upper-outer quadrant of left female breast: Secondary | ICD-10-CM | POA: Diagnosis not present

## 2017-04-05 DIAGNOSIS — I10 Essential (primary) hypertension: Secondary | ICD-10-CM | POA: Insufficient documentation

## 2017-04-05 DIAGNOSIS — E119 Type 2 diabetes mellitus without complications: Secondary | ICD-10-CM | POA: Diagnosis not present

## 2017-04-05 DIAGNOSIS — E785 Hyperlipidemia, unspecified: Secondary | ICD-10-CM | POA: Diagnosis not present

## 2017-04-05 DIAGNOSIS — Z9221 Personal history of antineoplastic chemotherapy: Secondary | ICD-10-CM | POA: Diagnosis not present

## 2017-04-05 DIAGNOSIS — Z9889 Other specified postprocedural states: Secondary | ICD-10-CM | POA: Diagnosis not present

## 2017-04-05 NOTE — Progress Notes (Signed)
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  The patient was originally seen in multidisciplinary clinic. She was felt to be a good candidate for breast conservation treatment. She has completed surgery consisting of a lumpectomy. Final pathology revealed a pT2N1 tumor with negative margins. The patient has also completed chemotherapy on June 21st. The patient has done satisfactorily postoperatively. She is appropriate to proceed with adjuvant radiation treatment at this time.  On review of systems, the patient endorses swelling in her lower extremities as well as SOB. She reports that her port is still in place.  ALLERGIES:  has No Known Allergies.  Meds: Current Outpatient Prescriptions  Medication Sig Dispense Refill  . amLODipine (NORVASC) 10 MG tablet TAKE 1 TABLET (10 MG TOTAL) BY MOUTH DAILY. 30 tablet 5  . aspirin 81 MG tablet Take 81 mg by mouth daily.      . B-D INS SYRINGE 0.5CC/31GX5/16 31G X 5/16" 0.5 ML MISC USE AS DIRECTED 4 TIMES A DAY 300 each 1  . carvedilol (COREG) 25 MG tablet TAKE 1 TABLET BY MOUTH TWICE A DAY WITH A MEAL 180 tablet 1  . cefdinir (OMNICEF) 300 MG capsule Take 1 capsule (300 mg total) by mouth 2 (two) times daily. 14 capsule 0  . cloNIDine (CATAPRES) 0.3 MG tablet Take 0.3 mg by mouth 2 (two) times daily.    . hydrochlorothiazide (HYDRODIURIL) 25 MG tablet Take 1 tablet (25 mg total) by mouth daily. 30 tablet 5  . HYDROcodone-acetaminophen (NORCO/VICODIN) 5-325 MG tablet Take 1-2 tablets by mouth every 6 (six) hours as needed for moderate pain. 20 tablet 0  . insulin aspart (NOVOLOG) 100 UNIT/ML injection Inject 5-6 Units into the  skin 2 (two) times daily with a meal. 10 mL 1  . Insulin Glargine (BASAGLAR KWIKPEN) 100 UNIT/ML SOPN Inject 24 Units into the skin every morning.   3  . Insulin Pen Needle 31G X 5 MM MISC Use pen needles for insulin injection daily 100 each 3  . Insulin Syringe-Needle U-100 (B-D INS SYRINGE 0.5CC/30GX1/2") 30G X 1/2" 0.5 ML MISC USE AS DIRECTED 4 TIMES A DAY 300 each 3  . lidocaine-prilocaine (EMLA) cream Apply to affected area once 30 g 3  . lisinopril (PRINIVIL,ZESTRIL) 40 MG tablet TAKE 1 TABLET (40 MG TOTAL) BY MOUTH DAILY. 30 tablet 5  . magnesium hydroxide (MILK OF MAGNESIA) 800 MG/5ML suspension Take 15 mLs by mouth.    . metFORMIN (GLUCOPHAGE) 500 MG tablet Take 500 mg by mouth 2 (two) times daily with a meal.    . OVER THE COUNTER MEDICATION Take 1 tablet by mouth daily as needed. Stool softner for constipation, also drinks warm prune juice prn    . rosuvastatin (CRESTOR) 5 MG tablet Take 1 tablet (5 mg total) by mouth daily. -- need labs for further refills 90 tablet 0  . dexamethasone (DECADRON) 4 MG tablet Take 2 tablets (8 mg total) by mouth 2 (two) times daily. Start the day before Taxotere. Then again the day after chemo for 3 days. (Patient not taking: Reported on 04/05/2017) 30 tablet 1  . ondansetron (ZOFRAN) 8 MG tablet Take 1 tablet (8  mg total) by mouth 2 (two) times daily as needed for refractory nausea / vomiting. Start on day 3 after chemo. (Patient not taking: Reported on 03/22/2017) 30 tablet 1  . prochlorperazine (COMPAZINE) 10 MG tablet Take 1 tablet (10 mg total) by mouth every 6 (six) hours as needed (Nausea or vomiting). (Patient not taking: Reported on 03/22/2017) 30 tablet 1  . traMADol (ULTRAM) 50 MG tablet Take 1 tablet (50 mg total) by mouth every 6 (six) hours as needed. (Patient not taking: Reported on 04/05/2017) 20 tablet 0   No current facility-administered medications for this encounter.     Physical Findings: The patient is in no acute distress. Patient is  alert and oriented.  height is 5' 1.5" (1.562 m) and weight is 179 lb 6.4 oz (81.4 kg). Her oral temperature is 98.5 F (36.9 C). Her blood pressure is 114/49 (abnormal) and her pulse is 73. Her respiration is 18. .   The patient is s/p lumpectomy. The surgical incision is healing well at upper outer left breast.  Lab Findings: Lab Results  Component Value Date   WBC 5.9 03/22/2017   HGB 9.2 (L) 03/22/2017   HCT 26.7 (L) 03/22/2017   MCV 81.8 03/22/2017   PLT 301 03/22/2017     Radiographic Findings: Dg Chest 2 View  Result Date: 03/14/2017 CLINICAL DATA:  Dyspnea on exertion, peripheral edema, and generalize weakness for the past 7 days. The patient is on chemotherapy for breast malignancy. History of chronic CHF. EXAM: CHEST  2 VIEW COMPARISON:  Chest x-ray of May 04, 2008 FINDINGS: The lungs are well-expanded and clear. The heart and pulmonary vascularity are normal. The mediastinum is normal in width. The power port catheter tip projects over the junction of the proximal and midportions of the SVC. There is no pleural effusion. The bony thorax exhibits no acute abnormality. There are surgical clips in the lateral aspect of the left breast. IMPRESSION: There is no pneumonia, CHF, nor other acute cardiopulmonary abnormality. Low lung whose Electronically Signed   By: David  Martinique M.D.   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 The patient is appropriate to proceed with adjuvant radiation treatment at this time.  I discussed with the patient the role of radiation treatment in this setting. We discussed the expected benefit in terms of local/regional control area we also discussed the possible side effects and risks of treatment. All of her questions were answered.  We also discussed the  logistics of treatment. The patient wishes to proceed with simulation.  Plan:  The patient will be scheduled for a simulation next week such that we can begin treatment planning. I anticipate treating the patient with a four week course of radiation treatment. This will correspond to whole breast radiation treatment to the left breast using tangent fields.  The patient was seen today for 30 minutes, with the majority of the time spent counseling the patient on his diagnosis of cancer and coordinating his care.    ------------------------------------------------  Jodelle Gross, MD, PhD  This document serves as a record of services personally performed by Kyung Rudd, MD. It was created on his behalf by Linward Natal, a trained medical scribe. The creation of this record is based on the scribe's personal observations and the provider's statements to them. This  document has been checked and approved by the attending provider.

## 2017-04-05 NOTE — Progress Notes (Signed)
Please see the Nurse Progress Note in the MD Initial Consult Encounter for this patient. 

## 2017-04-12 ENCOUNTER — Ambulatory Visit
Admission: RE | Admit: 2017-04-12 | Discharge: 2017-04-12 | Disposition: A | Discharge status: Home / Self Care | Payer: Medicare Other | Source: Ambulatory Visit | Attending: Radiation Oncology | Admitting: Radiation Oncology

## 2017-04-12 DIAGNOSIS — Z170 Estrogen receptor positive status [ER+]: Principal | ICD-10-CM

## 2017-04-12 DIAGNOSIS — C50412 Malignant neoplasm of upper-outer quadrant of left female breast: Secondary | ICD-10-CM

## 2017-04-12 DIAGNOSIS — I10 Essential (primary) hypertension: Secondary | ICD-10-CM | POA: Diagnosis not present

## 2017-04-12 DIAGNOSIS — E119 Type 2 diabetes mellitus without complications: Secondary | ICD-10-CM | POA: Diagnosis not present

## 2017-04-12 DIAGNOSIS — E785 Hyperlipidemia, unspecified: Secondary | ICD-10-CM | POA: Diagnosis not present

## 2017-04-13 DIAGNOSIS — E785 Hyperlipidemia, unspecified: Secondary | ICD-10-CM | POA: Diagnosis not present

## 2017-04-13 DIAGNOSIS — C50412 Malignant neoplasm of upper-outer quadrant of left female breast: Secondary | ICD-10-CM | POA: Diagnosis not present

## 2017-04-13 DIAGNOSIS — I10 Essential (primary) hypertension: Secondary | ICD-10-CM | POA: Diagnosis not present

## 2017-04-13 DIAGNOSIS — E119 Type 2 diabetes mellitus without complications: Secondary | ICD-10-CM | POA: Diagnosis not present

## 2017-04-13 NOTE — Progress Notes (Signed)
  Radiation Oncology         [phone] ________________________________  Name: Wendy [name] MRN: [id number]  Date: 04/12/2017  DOB: [birth year]   DIAGNOSIS:     ICD-10-CM   1. Malignant neoplasm of upper-outer quadrant of left breast in female, estrogen receptor positive ([place]) C50.412    Z17.0     SIMULATION AND TREATMENT PLANNING NOTE  The patient presented for simulation prior to beginning her course of radiation treatment for her diagnosis of left-sided breast cancer. The patient was placed in a supine position on a breast board. A customized vac-lock bag was constructed and this complex treatment device will be used on a daily basis during her treatment. In this fashion, a CT scan was obtained through the chest area and an isocenter was placed near the chest wall within the breast.  The patient will be planned to receive a course of radiation initially to a dose of 42.5 Gy. This will consist of a whole breast radiotherapy technique. To accomplish this, 2 customized blocks have been designed which will correspond to medial and lateral whole breast tangent fields. This treatment will be accomplished at 2.5 Gy per fraction. A forward planning technique will also be evaluated to determine if this approach improves the plan. It is anticipated that the patient will then receive a 7.5 Gy boost to the seroma cavity which has been contoured. This will be accomplished at 2.5 Gy per fraction.   This initial treatment will consist of a 3-D conformal technique. The seroma has been contoured as the primary target structure. Additionally, dose volume histograms of both this target as well as the lungs and heart will also be evaluated. Such an approach is necessary to ensure that the target area is adequately covered while the nearby critical  normal structures are adequately spared.  Plan:  The final anticipated total dose therefore will correspond to 50  Gy.    _______________________________   Jodelle Gross, MD, PhD

## 2017-04-13 NOTE — Progress Notes (Signed)
  Radiation Oncology         [phone] ________________________________  Name: Wendy [name] MRN: [id number]  Date: 04/12/2017  DOB: March 09, 1941  Optical Surface Tracking Plan:  Since intensity modulated radiotherapy (IMRT) and 3D conformal radiation treatment methods are predicated on accurate and precise positioning for treatment, intrafraction motion monitoring is medically necessary to ensure accurate and safe treatment delivery.  The ability to quantify intrafraction motion without excessive ionizing radiation dose can only be performed with optical surface tracking. Accordingly, surface imaging offers the opportunity to obtain 3D measurements of patient position throughout IMRT and 3D treatments without excessive radiation exposure.  I am ordering optical surface tracking for this patient's upcoming course of radiotherapy. ________________________________  Kyung Rudd, MD 04/13/2017 6:58 AM    Reference:   Particia Jasper, et al. Surface imaging-based analysis of intrafraction motion for breast radiotherapy patients.Journal of [place], n. 6, nov. 2014. ISSN [id number].   Available at: <http://www.jacmp.org/index.php/jacmp/article/view/4957>.

## 2017-04-16 NOTE — Progress Notes (Signed)
[facility]  [phone] [phone]  Clinic Follow Up Note   Patient Care Team: Binnie Rail, MD as PCP - General (Internal Medicine) Philemon Kingdom, MD as Consulting Physician (Internal Medicine) Jalene Mullet, MD as Consulting Physician (Ophthalmology) Erroll Luna, MD as Consulting Physician (General Surgery) Kyung Rudd, MD as Consulting Physician (Radiation Oncology) [name], MD as Consulting Physician (Hematology) 04/19/2017  CHIEF COMPLAINTS:  Follow Up for Invasive Ductal Carcinoma of the left breast  Oncology History   Cancer Staging Malignant neoplasm of upper-outer quadrant of left breast in female, estrogen receptor positive ([place]) Staging form: Breast, AJCC 8th Edition - Clinical: Stage IIB (cT2, cN0, cM0, G3, ER: Positive, PR: Negative, HER2: Negative) - Signed by [name], MD on 11/23/2016 - Pathologic stage from 12/28/2016: Stage IIB (pT2, pN1a(sn), cM0, G3, ER: Positive, PR: Negative, HER2: Negative) - Signed by [name], MD on 01/02/2017       Malignant neoplasm of upper-outer quadrant of left breast in female, estrogen receptor positive ([place])   11/01/2016 Mammogram    Diagnostic mammogram and ultrasound of left breast and axilla showed a 3.1 x 2.1 x 1.4 cm (3.3 x 2.0 x 2.7 cm by ultrasound) mass in the upper outer quadrant of the anterior third of the left breast, associated with pleomorphic calcification. There is a 8 mm (1.8cm by [name]) prominent lymph node in the left axilla.      11/07/2016 Initial Biopsy    Left breast 1:00 position biopsy showed invasive ductal carcinoma and DCIS, G3, left axillary node biopsy was negative.      11/07/2016 Receptors her2    ER 80% positive, PR negative, HER-2 negative, Ki-67 90%      11/20/2016 Initial Diagnosis    Malignant neoplasm of upper-outer quadrant of left breast in female, estrogen receptor positive ([place])     12/07/2016 Surgery    Left lumpectomy and left axillary sentinel  lymph node sampling by Dr. Brantley Stage      12/07/2016 Pathology Results    pT2, pN1 Left Lumpectomy: Grade 3 IDC measuring 3.4 cm, carcinoma broadly present at the superior margin. Grade 3 DCIS. 1 out of 2 left axillary SLN positive for metastatic carcinoma      12/07/2016 Miscellaneous    Mammaprint showed high risk disease, basal type       12/28/2016 Surgery    Re-excision of the previously positive superior margin was negative for malignant cells.       01/04/2017 Surgery    Port inserted      01/18/2017 - 03/22/2017 Chemotherapy    Adjuvant Docetaxel 75 mg/m and Cytoxan 600 mg/m, every 21 days, for total of 4 cycles, with Neulasta on day 2.        HISTORY OF PRESENTING ILLNESS:  Wendy [name] 76 y.o. female is here because of invasive ductal carcinoma of the left breast. She is accompanied by her son to my clinic today. She was referred by her breast surgeon Dr. Brantley Stage.   About 2 months ago, the patient noted a mass on her left breast. She underwent a mammogram and breast US on 11/01/2016, which showed a 3.1 cm mass in the upper outer quadrant of the left breast with pleomorphic calcifications. Imaging also noted abnormal appearing lymph nodes in the left axilla. A core biopsy was done on 11/07/2016, which revealed invasive ductal carcinoma ER positive PR negative with a Ki-67 of 90%. On November 13, 2016 she met with Dr. Brantley Stage to discuss a lumpectomy.  She presents for further treatment.   She presents with her son today. She felt the lump herself about 2 months ago. She noticed last month that it had gotten a little bigger. So she went to her PCP, who ordered a mammogram. She does not get mammograms every year. Her last mammogram was May 2017, which showed no cancer. She had some pain and itching in her left nipple before her diagnosis. She has some lower back pain if she is laying down too long, but that has been present for a while. Denies any new bone or joint pain, weight loss, breast  swelling, nipple discharge, or any other concerns. She is very active and takes care of herself and her husband.   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 They have a nurse for the rest of the week. He has dementia as well as numerous other health problems. Her son has moved in with her to help with her husband. She has a history of HTN and DM. Her DM is not well controlled; she takes medication for this. Her BP is also not well controlled on lisinopril. She has never had a heart attack or stroke. No surgeries in the past other than the removal of her benign lesion. No family history of breast cancer. Her father did have a brain tumor at the age of 67, which he passed from. Never smoker. She does not drink alcohol. She has 9 grandchildren.   GYN HISTORY  Menarchal: 76 y.o LMP: around 76 y.o Contraceptive: n/a HRT: No GP: 4 pregnancies, 4 children (3 boys, 1 girl). 1st child at age 50  CURRENT THERAPY: pending adjuvant breast radiation   INTVERVAL HISTORY:  [name] returns for a follow up. She presents to the clinic accompanied by her son She is going to begin 4 weeks of  radiation on 7/23. She has been recovering well since her chemo denying any nausea and vomiting. She also denies any neuropathy. Her appetite has resolved now.    MEDICAL HISTORY:  Past Medical History:  Diagnosis Date  . Breast cancer ([place]) 11/2016   left  . GERD (gastroesophageal reflux disease)    TUMS as needed  . HTN (hypertension)    states BP has been high recently; has been on med. x 20 yr.  . Hyperlipidemia   . Insulin dependent diabetes mellitus ([place])     SURGICAL HISTORY: Past Surgical History:  Procedure Laterality Date  . BREAST LUMPECTOMY Left 05/05/2008  . BREAST LUMPECTOMY WITH RADIOACTIVE SEED AND SENTINEL LYMPH NODE BIOPSY Left 12/07/2016   Procedure: LEFT BREAST LUMPECTOMY WITH RADIOACTIVE SEED AND SENTINEL LYMPH NODE BIOPSY;  Surgeon: Erroll Luna, MD;  Location:  [facility];  Service: General;  Laterality: Left;  . IR FLUORO GUIDE PORT INSERTION RIGHT  01/04/2017  . IR US GUIDE VASC ACCESS RIGHT  01/04/2017  . RE-EXCISION OF BREAST LUMPECTOMY Left 12/28/2016   Procedure: RE-EXCISION OF BREAST LUMPECTOMY;  Surgeon: Erroll Luna, MD;  Location: [facility];  Service: General;  Laterality: Left;    SOCIAL HISTORY: Social History   Social History  . Marital status: Married    Spouse name: N/A  . Number of children: 4  . Years of education: N/A   Occupational History  . [employer]   Social History Main Topics  . Smoking status: Never Smoker  . Smokeless tobacco: Current User    Types: Snuff  . Alcohol use  No  . Drug use: No  . Sexual activity: Not Currently   Other Topics Concern  . Not on file   Social History Narrative  . No narrative on file    FAMILY HISTORY: Family History  Problem Relation Age of Onset  . Diabetes Sister        x 4  . Diabetes Brother   . Diabetes Paternal Grandmother   . Diabetes Paternal Aunt   . Heart disease Brother   . Heart disease Sister   . Emphysema Brother   . Cancer Father 37       brain tumor     ALLERGIES:  has No Known Allergies.  MEDICATIONS:  Current Outpatient Prescriptions  Medication Sig Dispense Refill  . amLODipine (NORVASC) 10 MG tablet TAKE 1 TABLET (10 MG TOTAL) BY MOUTH DAILY. 30 tablet 5  . aspirin 81 MG tablet Take 81 mg by mouth daily.      . B-D INS SYRINGE 0.5CC/31GX5/16 31G X 5/16" 0.5 ML MISC USE AS DIRECTED 4 TIMES A DAY 300 each 1  . carvedilol (COREG) 25 MG tablet TAKE 1 TABLET BY MOUTH TWICE A DAY WITH A MEAL 180 tablet 1  . cloNIDine (CATAPRES) 0.3 MG tablet Take 0.3 mg by mouth 2 (two) times daily.    . hydrochlorothiazide (HYDRODIURIL) 25 MG tablet Take 1 tablet (25 mg total) by mouth daily. 30 tablet 5  . insulin aspart (NOVOLOG) 100 UNIT/ML injection Inject 5-6 Units into the skin 2 (two) times daily with a meal. 10 mL 1  . Insulin  Glargine (LANTUS SOLOSTAR) 100 UNIT/ML Solostar Pen Inject 24 Units into the skin every morning. 10 pen 0  . Insulin Pen Needle 31G X 5 MM MISC Use pen needles for insulin injection daily 100 each 3  . Insulin Syringe-Needle U-100 (B-D INS SYRINGE 0.5CC/30GX1/2") 30G X 1/2" 0.5 ML MISC USE AS DIRECTED 4 TIMES A DAY 300 each 3  . lidocaine-prilocaine (EMLA) cream Apply to affected area once 30 g 3  . lisinopril (PRINIVIL,ZESTRIL) 40 MG tablet TAKE 1 TABLET (40 MG TOTAL) BY MOUTH DAILY. 30 tablet 5  . magnesium hydroxide (MILK OF MAGNESIA) 800 MG/5ML suspension Take 15 mLs by mouth.    . metFORMIN (GLUCOPHAGE) 500 MG tablet Take 500 mg by mouth 2 (two) times daily with a meal.    . OVER THE COUNTER MEDICATION Take 1 tablet by mouth daily as needed. Stool softner for constipation, also drinks warm prune juice prn    . rosuvastatin (CRESTOR) 5 MG tablet Take 1 tablet (5 mg total) by mouth daily. -- need labs for further refills 90 tablet 0  . HYDROcodone-acetaminophen (NORCO/VICODIN) 5-325 MG tablet Take 1-2 tablets by mouth every 6 (six) hours as needed for moderate pain. 20 tablet 0  . ondansetron (ZOFRAN) 8 MG tablet Take 1 tablet (8 mg total) by mouth 2 (two) times daily as needed for refractory nausea / vomiting. Start on day 3 after chemo. (Patient not taking: Reported on 04/19/2017) 30 tablet 1  . prochlorperazine (COMPAZINE) 10 MG tablet Take 1 tablet (10 mg total) by mouth every 6 (six) hours as needed (Nausea or vomiting). (Patient not taking: Reported on 03/22/2017) 30 tablet 1  . traMADol (ULTRAM) 50 MG tablet Take 1 tablet (50 mg total) by mouth every 6 (six) hours as needed. (Patient not taking: Reported on 04/05/2017) 20 tablet 0   No current facility-administered medications for this visit.     REVIEW OF SYSTEMS:  Constitutional: Denies fevers, chills or abnormal night sweats (+) resolved appetite Eyes: Denies blurriness of vision, double vision or watery eyes Ears, nose, mouth, throat,  and face: Denies mucositis or sore throat Respiratory: Denies cough, dyspnea or wheezes Cardiovascular: Denies palpitation, chest discomfort or lower extremity swelling, Gastrointestinal:  Denies nausea, heartburn  Skin: Denies abnormal skin rashes (+)darkened fingernails from chemo Lymphatics: Denies new lymphadenopathy or easy bruising Neurological:Denies numbness, tingling or new weaknesses Behavioral/Psych: Mood is stable, no new changes  Musculoskeletal: (+) lower back pain Breast: (+) breast pain and itching at L nipple All other systems were reviewed with the patient and are negative.  PHYSICAL EXAMINATION:  ECOG PERFORMANCE STATUS: 1  Vitals:   04/19/17 1427  BP: (!) 133/52  Pulse: 78  Resp: 18  Temp: 97.9 F (36.6 C)   Filed Weights   04/19/17 1427  Weight: 179 lb 11.2 oz (81.5 kg)     GENERAL:alert, no distress and comfortable SKIN: skin color, texture, turgor are normal, no rashes or significant lesions  EYES: normal, conjunctiva are pink and non-injected, sclera clear OROPHARYNX:no exudate, no erythema and lips, buccal mucosa, and tongue normal  NECK: supple, thyroid normal size, non-tender, without nodularity LYMPH:  no palpable lymphadenopathy in the cervical, axillary or inguinal LUNGS: clear to auscultation and percussion with normal breathing effort HEART: regular rate & rhythm and no murmurs and no lower extremity edema ABDOMEN:abdomen soft, non-tender and normal bowel sounds Musculoskeletal:no cyanosis of digits and no clubbing  PSYCH: alert & oriented x 3 with fluent speech NEURO: no focal motor/sensory deficits Breasts: Breast inspection showed them to be symmetrical with no nipple discharge. Surgical scar in the left breast healing well, no discharge or skin erythema, no palpable mass or adenopathy. Old scar in inferior of L breast from previous lumpectomy. Incision site is clean without sign of infection.  LABORATORY DATA:  I have reviewed the data as  listed CBC Latest Ref Rng & Units 04/19/2017 03/22/2017 03/14/2017  WBC 3.9 - 10.3 10e3/uL 4.2 5.9 22.6 Repeated and verified X2.(HH)  Hemoglobin 11.6 - 15.9 g/dL 9.2(L) 9.2(L) 9.8(L)  Hematocrit 34.8 - 46.6 % 26.7(L) 26.7(L) 28.7(L)  Platelets 145 - 400 10e3/uL 256 301 267.0   CMP Latest Ref Rng & Units 04/19/2017 03/22/2017 03/14/2017  Glucose 70 - 140 mg/dl 133 413(H) 161(H)  BUN 7.0 - 26.0 mg/dL 27.8(H) 30.1(H) 19  Creatinine 0.6 - 1.1 mg/dL 1.1 1.3(H) 0.97  Sodium 136 - 145 mEq/L 138 134(L) 136  Potassium 3.5 - 5.1 mEq/L 4.2 4.0 4.7  Chloride 96 - 112 mEq/L - - 101  CO2 22 - 29 mEq/L _0 Calcium 8.4 - 10.4 mg/dL 9.4 9.6 9.4  Total Protein 6.4 - 8.3 g/dL 6.7 6.8 6.9  Total Bilirubin 0.20 - 1.20 mg/dL 0.39 0.45 0.3  Alkaline Phos 40 - 150 U/L 71 80 113  AST 5 - 34 U/L _1 ALT 0-55 U/L U/L <_2 PATHOLOGY: Diagnosis 12/28/16 Breast, excision, left, superior margin - BENIGN FIBROADIPOSE TISSUE WITH HEALING BIOPSY SITE. - BENIGN SKELETAL MUSCLE. - THERE IS NO EVIDENCE OF MALIGNANCY. - SEE COMMENT. Microscopic Comment The surgical resection margin(s) of the specimen were inked and microscopically evaluated. (JBK:ah 01/01/17) Enid Cutter MD Pathologist, Electronic Signature (Case signed 01/01/2017)  Mammaprint 12/07/16   Diagnosis 12/07/2016 1. Breast, lumpectomy, Left w/seed INVASIVE DUCTAL CARCINOMA, GRADE 3, SPANNING 3.4 CM (PT2) DUCTAL CARCINOMA IN SITU IS PRESENT, GRADE 3 THE CARCINOMA IS BROADLY PRESENTED AT  THE SUPERIOR MARGIN 2. Lymph node, sentinel, biopsy, Left Axillary ONE BENIGN LYMPH NODE (0/1) 3. Lymph node, sentinel, biopsy, Left METASTATIC CARCINOMA IN ONE OF ONE LYMPH NODE (1/1) 4. Breast, excision, Left additional Medial Margin BENIGN BREAST TISSUE Microscopic Comment 1. BREAST, INVASIVE TUMOR Procedure: Lumpectomy Laterality: Left Tumor Size: 3.4 cm Histologic Type: Ductal carcinoma Grade: Tubular Differentiation: 3 Nuclear Pleomorphism:  2 Mitotic Count:3 Ductal Carcinoma in Situ (DCIS): present Extent of Tumor: Skin: negative Nipple: negative Skeletal muscle: negative Margins: Invasive carcinoma, distance from closest margin: invasive carcinoma presented at the superior cauterized margin DCIS, distance from closest margin: 0.1 cm from the superior margin Regional Lymph Nodes: Number of Lymph Nodes Examined: 1 of 3 FINAL for SHERISSA, TENENBAUM ([id number]) Microscopic Comment(continued) Number of Sentinel Lymph Nodes Examined: 2 Lymph Nodes with Macrometastases: 1 Lymph Nodes with Micrometastases: 0 Lymph Nodes with Isolated Tumor Cells: 0 Breast Prognostic Profile: Estrogen Receptor: 80% Progesterone Receptor: 0% Her2: Negative Ki-67: 90% Pathologic Stage Classification (pTNM, AJCC 8th Edition): Primary Tumor (pT): pT2 Regional Lymph Nodes (pN): pN1 Distant Metastases (pM): pMx Casimer Lanius MD Pathologist, Electronic Signature (Case signed 12/10/2016)  Diagnosis 11/07/2016 1. Breast, left, needle core biopsy, 1:00 o'clock upper outer quadrant - INVASIVE DUCTAL CARCINOMA. - DUCTAL CARCINOMA IN SITU - SEE COMMENT. 2. Lymph node, needle/core biopsy, inferior left axilla - ONE PARTIALLY SAMPLED BENIGN LYMPH NODE WITH NO TUMOR SEEN. Microscopic Comment 1. Although definitive grading of breast carcinoma is best done on excision, the features of the invasive tumor from the left 1 o'clock upper outer quadrant breast biopsy are compatible with a grade 3 breast carcinoma. Breast prognostic markers will be performed and reported in an addendum. Findings are called to [facility] on 11/08/2016. Dr. Lyndon Code has seen the left 1:00 breast biopsy in consultation with agreement. 2. The findings from the left inferior axillary lymph node are called to the [facility] on 11/08/2016. Dr. Lyndon Code has seen the lymph node biopsy in consultation with agreement. (RH:kh 11/08/16) 1. FLUORESCENCE IN-SITU  HYBRIDIZATION Results: HER2 - NEGATIVE RATIO OF HER2/CEP17 SIGNALS 1.71 AVERAGE HER2 COPY NUMBER PER CELL 2.90 Reference Range: NEGATIVE HER2/CEP17 Ratio <2.0 and average HER2 copy number <4.0 EQUIVOCAL HER2/CEP17 Ratio <2.0 and average HER2 copy number >=4.0 and <6.0 POSITIVE HER2/CEP17 Ratio >=2.0 or <2.0 and average HER2 copy number >=6.0 1. PROGNOSTIC INDICATORS Results: IMMUNOHISTOCHEMICAL AND MORPHOMETRIC ANALYSIS PERFORMED MANUALLY Estrogen Receptor: 80%, POSITIVE, WEAK STAINING INTENSITY Progesterone Receptor: 0%, NEGATIVE Proliferation Marker Ki67: 90% COMMENT: The negative hormone receptor study(ies) in this case has An internal positive control. REFERENCE RANGE ESTROGEN RECEPTOR NEGATIVE 0% POSITIVE =>1% REFERENCE RANGE PROGESTERONE RECEPTOR NEGATIVE 0% POSITIVE =>1%  RADIOGRAPHIC STUDIES: I have personally reviewed the radiological images as listed and agreed with the findings in the report. No results found.   Mammogram: 11/01/16 Diagnostic mammogram and ultrasound of left breast and axilla showed a 3.1 x 2.1 x 1.4 cm (3.3 x 2.0 x 2.7 cm by ultrasound) mass in the upper outer quadrant of the anterior third of the left breast, associated with pleomorphic calcification. There is a 8 mm (1.8cm by [name]) prominent lymph node in the left axilla.   ASSESSMENT & PLAN:  Mrs. Massingale is a 76 y.o. female who presented with a self palpable left breast mass  1. Malignant neoplasm of upper-outer quadrant of left breast, Invasive Ductal Carcinoma, pT2pN1M0, stage IIB, ER+/PR-/HER2-, G3, mammaprint high risk  --I discussed her surgical path result in details, she had reexcision for positive  margins, her final surgical margins were negative. -Left lumpectomy and SLN biopsy on 12/07/16 revealed grade 3 IDC measuring 3.4 cm, carcinoma was broadly present at the superior margin, grade 3 DCIS, and 1 out of 2 left axillary SLNs were positive for metastatic carcinoma. With carcinoma broadly  present at the superior margin. Excision of the left superior margin on 12/28/16 showed no evidence of malignancy. -Mammaprint showed high risk disease, basal type.The average 10 year risk of recurrence without adjuvant therapy is 29%. -I previously recommend her to consider adjuvant chemotherapy to reduce her risk of recurrence after surgery. -Given her age and good PS, I previously advised moderate chemotherapy with 4-6 cycles of Docetaxel and Cytoxan, giving every 3 weeks intravenously. -She has completed adjuvant chemotherapy, tolerating well overall. -She is scheduled to start adjuvant breast radiation next week.  - I will see her back on 8/30 she completes radiation and she will start antiestrogen therapy -  Labs reviewed today, mild anemia of 9.2 today -Continue breast cancer surveillance  2. HTN and DM -Not well controlled with medication -Follow with PCP -We previously discussed the potential impact of chemotherapy, especially premedication steroids, on her blood glucose and blood pressure. We'll monitor closely. -We previously discussed staying active and eating healthy.  -Her blood glucose was high during the chemotherapy, partially related to the steroids for chemo. - she has not been taking her insulin due to the copay, I strongly encouraged her to refill her insulin.   3. CKD stage III -She has slightly elevated creatinine at baseline, EGFR 46, probably secondary to her diabetes and hypertension -We'll try to avoid dehydration, and the nephrotoxic medications. -Her cr has been stable    Plan:  -she will start adjuvant breast radiation next week -Plan to start anti estrogen therapy after complete radiation - f/u with lab in 6 weeks when she completes RT     All questions were answered. The patient knows to call the clinic with any problems, questions or concerns.  I spent 25 minutes counseling the patient face to face. The total time spent in the appointment was 30  minutes and more than 50% was on counseling.   [name], MD 04/19/2017   This document serves as a record of services personally performed by [name], MD. It was created on her behalf by Brandt Loosen, a trained medical scribe. The creation of this record is based on the scribe's personal observations and the provider's statements to them. This document has been checked and approved by the attending provider.

## 2017-04-17 ENCOUNTER — Telehealth: Payer: Self-pay | Admitting: Internal Medicine

## 2017-04-17 NOTE — Telephone Encounter (Signed)
Patient called, I just received this message. She states her moms insurance will no longer cover the [place], if you would like to change it, if it is after 5, they ask that you go ahead and send it into the pharmacy if we change to Lantus. If you would like to keep her on the same medication I can submit the PA to the number provided.:[phone]  Please advise. Thank you!

## 2017-04-17 NOTE — Telephone Encounter (Signed)
Lantus (same dose) is fine.

## 2017-04-17 NOTE — Telephone Encounter (Signed)
Patient's daughter called to advise that her Insulin Glargine (BASAGLAR KWIKPEN) 100 UNIT/ML SOPN is no longer covered by insurance. Call patient to advise and discuss an alternative.

## 2017-04-18 ENCOUNTER — Other Ambulatory Visit: Payer: Self-pay

## 2017-04-18 MED ORDER — INSULIN GLARGINE 100 UNIT/ML SOLOSTAR PEN: PEN_INJECTOR | SUBCUTANEOUS | 0 refills | Status: DC

## 2017-04-18 NOTE — Telephone Encounter (Signed)
Submitted Lantus into pharmacy.

## 2017-04-19 ENCOUNTER — Ambulatory Visit (HOSPITAL_BASED_OUTPATIENT_CLINIC_OR_DEPARTMENT_OTHER): Payer: Medicare Other | Admitting: Hematology

## 2017-04-19 ENCOUNTER — Ambulatory Visit
Admission: RE | Admit: 2017-04-19 | Discharge: 2017-04-19 | Disposition: A | Discharge status: Home / Self Care | Payer: Medicare Other | Source: Ambulatory Visit | Attending: Radiation Oncology | Admitting: Radiation Oncology

## 2017-04-19 ENCOUNTER — Ambulatory Visit (HOSPITAL_BASED_OUTPATIENT_CLINIC_OR_DEPARTMENT_OTHER): Payer: Medicare Other

## 2017-04-19 ENCOUNTER — Other Ambulatory Visit (HOSPITAL_BASED_OUTPATIENT_CLINIC_OR_DEPARTMENT_OTHER): Payer: Medicare Other

## 2017-04-19 VITALS — BP 133/52 | HR 78 | Temp 97.9°F | Resp 18 | Ht 65.1 in | Wt 179.7 lb

## 2017-04-19 DIAGNOSIS — C50412 Malignant neoplasm of upper-outer quadrant of left female breast: Secondary | ICD-10-CM

## 2017-04-19 DIAGNOSIS — Z95828 Presence of other vascular implants and grafts: Secondary | ICD-10-CM

## 2017-04-19 DIAGNOSIS — Z170 Estrogen receptor positive status [ER+]: Secondary | ICD-10-CM | POA: Diagnosis not present

## 2017-04-19 DIAGNOSIS — N183 Chronic kidney disease, stage 3 unspecified: Secondary | ICD-10-CM

## 2017-04-19 DIAGNOSIS — I5032 Chronic diastolic (congestive) heart failure: Secondary | ICD-10-CM

## 2017-04-19 DIAGNOSIS — E119 Type 2 diabetes mellitus without complications: Secondary | ICD-10-CM

## 2017-04-19 DIAGNOSIS — I10 Essential (primary) hypertension: Secondary | ICD-10-CM | POA: Diagnosis not present

## 2017-04-19 DIAGNOSIS — E785 Hyperlipidemia, unspecified: Secondary | ICD-10-CM | POA: Diagnosis not present

## 2017-04-19 LAB — COMPREHENSIVE METABOLIC PANEL
ALBUMIN: 3.5 g/dL (ref 3.5–5.0)
ALK PHOS: 71 U/L (ref 40–150)
AST: 9 U/L (ref 5–34)
Anion Gap: 7 mEq/L (ref 3–11)
BILIRUBIN TOTAL: 0.39 mg/dL (ref 0.20–1.20)
BUN: 27.8 mg/dL — AB (ref 7.0–26.0)
CO2: 26 mEq/L (ref 22–29)
Calcium: 9.4 mg/dL (ref 8.4–10.4)
Chloride: 105 mEq/L (ref 98–109)
Creatinine: 1.1 mg/dL (ref 0.6–1.1)
EGFR: 59 mL/min/{1.73_m2} — ABNORMAL LOW (ref >90)
GLUCOSE: 133 mg/dL (ref 70–140)
POTASSIUM: 4.2 meq/L (ref 3.5–5.1)
SODIUM: 138 meq/L (ref 136–145)
Total Protein: 6.7 g/dL (ref 6.4–8.3)

## 2017-04-19 LAB — CBC WITH DIFFERENTIAL/PLATELET
BASO%: 1.2 % (ref 0.0–2.0)
Basophils Absolute: 0.1 10*3/uL (ref 0.0–0.1)
EOS%: 4.2 % (ref 0.0–7.0)
Eosinophils Absolute: 0.2 10*3/uL (ref 0.0–0.5)
HEMATOCRIT: 26.7 % — AB (ref 34.8–46.6)
HEMOGLOBIN: 9.2 g/dL — AB (ref 11.6–15.9)
LYMPH#: 0.8 10*3/uL — AB (ref 0.9–3.3)
LYMPH%: 18 % (ref 14.0–49.7)
MCH: 28.8 pg (ref 25.1–34.0)
MCHC: 34.2 g/dL (ref 31.5–36.0)
MCV: 84.1 fL (ref 79.5–101.0)
MONO#: 0.5 10*3/uL (ref 0.1–0.9)
MONO%: 11.4 % (ref 0.0–14.0)
NEUT%: 65.2 % (ref 38.4–76.8)
NEUTROS ABS: 2.8 10*3/uL (ref 1.5–6.5)
Platelets: 256 10*3/uL (ref 145–400)
RBC: 3.18 10*6/uL — ABNORMAL LOW (ref 3.70–5.45)
RDW: 20.5 % — AB (ref 11.2–14.5)
WBC: 4.2 10*3/uL (ref 3.9–10.3)

## 2017-04-19 MED ORDER — SODIUM CHLORIDE 0.9% FLUSH
10.0000 mL | Freq: Once | INTRAVENOUS | Status: AC
  Administered 2017-04-19: 10 mL
  Filled 2017-04-19: qty 10

## 2017-04-19 MED ORDER — HEPARIN SOD (PORK) LOCK FLUSH 100 UNIT/ML IV SOLN
500.0000 [IU] | Freq: Once | INTRAVENOUS | Status: AC
  Administered 2017-04-19: 500 [IU]
  Filled 2017-04-19: qty 5

## 2017-04-22 ENCOUNTER — Encounter: Payer: Self-pay | Admitting: Hematology

## 2017-04-23 ENCOUNTER — Ambulatory Visit
Admission: RE | Admit: 2017-04-23 | Discharge: 2017-04-23 | Disposition: A | Discharge status: Home / Self Care | Payer: Medicare Other | Source: Ambulatory Visit | Attending: Radiation Oncology | Admitting: Radiation Oncology

## 2017-04-23 DIAGNOSIS — C50412 Malignant neoplasm of upper-outer quadrant of left female breast: Secondary | ICD-10-CM

## 2017-04-23 DIAGNOSIS — Z170 Estrogen receptor positive status [ER+]: Principal | ICD-10-CM

## 2017-04-23 DIAGNOSIS — E119 Type 2 diabetes mellitus without complications: Secondary | ICD-10-CM | POA: Diagnosis not present

## 2017-04-23 DIAGNOSIS — I10 Essential (primary) hypertension: Secondary | ICD-10-CM | POA: Diagnosis not present

## 2017-04-23 DIAGNOSIS — E785 Hyperlipidemia, unspecified: Secondary | ICD-10-CM | POA: Diagnosis not present

## 2017-04-23 MED ORDER — RADIAPLEXRX EX GEL
Freq: Once | CUTANEOUS | Status: AC
  Administered 2017-04-23: 16:00:00 via TOPICAL

## 2017-04-23 MED ORDER — ALRA NON-METALLIC DEODORANT (RAD-ONC)
1.0000 "application " | Freq: Once | TOPICAL | Status: AC
  Administered 2017-04-23: 1 via TOPICAL

## 2017-04-23 NOTE — Progress Notes (Signed)
Pt education done, Radiation therapy and you book, my business card, radiaplex gel cream,alra deodorant , given, discussed side effects skin irritation, pain, fatigue, increase protein in your siet, at healthier, stay hydrated,drink plenty water, dove soap unscented preferred, luke warm bath/shower, pat dry,no rubbing, scrubbing or scratching breast, no under wire bra, electric shaver only;; apply radiaplex cream to breast after rad tx and at bedtime, alra after rad tx and prn,  Sees MD weekly and prn, teach back given 3:45 PM

## 2017-04-24 ENCOUNTER — Ambulatory Visit
Admission: RE | Admit: 2017-04-24 | Discharge: 2017-04-24 | Disposition: A | Discharge status: Home / Self Care | Payer: Medicare Other | Source: Ambulatory Visit | Attending: Radiation Oncology | Admitting: Radiation Oncology

## 2017-04-24 DIAGNOSIS — I10 Essential (primary) hypertension: Secondary | ICD-10-CM | POA: Diagnosis not present

## 2017-04-24 DIAGNOSIS — C50412 Malignant neoplasm of upper-outer quadrant of left female breast: Secondary | ICD-10-CM | POA: Diagnosis not present

## 2017-04-24 DIAGNOSIS — E785 Hyperlipidemia, unspecified: Secondary | ICD-10-CM | POA: Diagnosis not present

## 2017-04-24 DIAGNOSIS — E119 Type 2 diabetes mellitus without complications: Secondary | ICD-10-CM | POA: Diagnosis not present

## 2017-04-25 ENCOUNTER — Encounter: Payer: Self-pay | Admitting: Internal Medicine

## 2017-04-25 ENCOUNTER — Ambulatory Visit (INDEPENDENT_AMBULATORY_CARE_PROVIDER_SITE_OTHER): Payer: Medicare Other | Admitting: Internal Medicine

## 2017-04-25 ENCOUNTER — Ambulatory Visit
Admission: RE | Admit: 2017-04-25 | Discharge: 2017-04-25 | Disposition: A | Discharge status: Home / Self Care | Payer: Medicare Other | Source: Ambulatory Visit | Attending: Radiation Oncology | Admitting: Radiation Oncology

## 2017-04-25 VITALS — BP 130/74 | HR 83 | Ht 61.5 in | Wt 176.0 lb

## 2017-04-25 DIAGNOSIS — E1165 Type 2 diabetes mellitus with hyperglycemia: Secondary | ICD-10-CM

## 2017-04-25 DIAGNOSIS — E785 Hyperlipidemia, unspecified: Secondary | ICD-10-CM

## 2017-04-25 DIAGNOSIS — I10 Essential (primary) hypertension: Secondary | ICD-10-CM | POA: Diagnosis not present

## 2017-04-25 DIAGNOSIS — Z794 Long term (current) use of insulin: Secondary | ICD-10-CM

## 2017-04-25 DIAGNOSIS — E113419 Type 2 diabetes mellitus with severe nonproliferative diabetic retinopathy with macular edema, unspecified eye: Secondary | ICD-10-CM

## 2017-04-25 DIAGNOSIS — C50412 Malignant neoplasm of upper-outer quadrant of left female breast: Secondary | ICD-10-CM | POA: Diagnosis not present

## 2017-04-25 DIAGNOSIS — E119 Type 2 diabetes mellitus without complications: Secondary | ICD-10-CM | POA: Diagnosis not present

## 2017-04-25 LAB — POCT GLYCOSYLATED HEMOGLOBIN (HGB A1C): Hemoglobin A1C: 7.4

## 2017-04-25 NOTE — Patient Instructions (Addendum)
Please continue: - Metformin 1000 mg with dinner - Lantus 24 units in am  Please take Novolog 15 min before each of the 3 meals: - 6 units before a smaller meal - 8 units before a regular meal - 10 units before a large meal or if you have dessert Try to skip the snack at night, if sugars >100.  Please return in 3 months with your sugar log.

## 2017-04-25 NOTE — Progress Notes (Signed)
Patient ID: Wendy [name], female   DOB: 06-13-1941, 76 y.o.   MRN: [id number]  HPI: Wendy [name] is a 76 y.o.-year-old female, returning for f/u for DM2, dx in 71s, insulin-dependent since 1990, uncontrolled, with complications (CKD, DR). Last visit 3.5 months ago.  She had L breast lumpectomy (BrCA) >> had ChTx  X 4 weeks, now RxTx for 4 weeks. She will finish in 1 mo.  Last hemoglobin A1c was: Lab Results  Component Value Date   HGBA1C 8.2 01/08/2017   HGBA1C 9.4 06/07/2016   HGBA1C 8.6 02/09/2016   Pt was on: - Metformin (1000 mg) in am (! - she did not move it at night!) - Basaglar (20-)24 units in am (!) - Novolog: - 6 units before a smaller meal (only taking this!!!) - 8 units before a regular meal - 10 units before a large meal or if you have dessert   Now on: - Metformin 1000 mg with lunch (did not move it with dinner) - Lantus 24 units in am - Novolog 2x a day - skips lunch dose!, but does eat lunch - 6 units before a smaller meal - 8 units before a regular meal - 10 units before a large meal or if you have dessert Try to skip the snack at night, if sugars >100.  Pt checks her sugars 3x a day: - am:  149-240  (eats snack at night) >> 114, 189-236 >> 125-226 - 2h after b'fast: 149, 210-261, 318 >> n/c >> 116-270 >> n/c - before lunch:  40 x1, 60 x1 (delayed lunch), 112, 154 >> 52, 231, 250 >> 70-215 >> 128-171, 235 - 2h after lunch: 154-237 >> 200 >> n/c >> 200, 203 >> n/c >> 269 - before dinner: 171-277 >> 249, 254, 400 >> n/c >> 194 >> 209 >> 210, 244 - 2h after dinner: 284 >> 143 >> n/c >> 174, 302 >> 120-174 >> 135 - bedtime: 189-254 >> 103 >> 60x1 >> 200 >> n/c  >> 198, 322 - nighttime: 185-265 >> 32!x1, 117x2 >> 132 >> 244-290 >> n/c >> 73 x1, 130 No lows. Lowest sugar was 32 >> 40 (delayed meal) >> 52 x1 >> 70 >> 73; she has hypoglycemia awareness at 70s.  Highest sugar was 270 >> 322  Glucometer: [employer]  Pt's meals are: - Breakfast: toast, egg,  bacon/sausage; oatmeal - Lunch: [place] sandwich, soups, crackers - Dinner: baked chicken, potatoes, cabbage (dinner is her largest meal) - Snacks: PB and crackers + Juice!  - +  CKD, last BUN/creatinine:  Lab Results  Component Value Date   BUN 27.8 (H) 04/19/2017   CREATININE 1.1 04/19/2017  On lisinopri - last set of lipids: Lab Results  Component Value Date   CHOL 165 10/20/2015   HDL 50.20 10/20/2015   LDLCALC 92 10/20/2015   LDLDIRECT 91 10/25/2012   TRIG 112.0 10/20/2015   CHOLHDL 3 10/20/2015  She had to stop Crestor 5 mg due to muscle pain >> restarted. - last eye exam was on 05/2016. 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 She had a Laser tx. 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  She is a caregiver for her husband, who is in a wheelchair.   ROS: Constitutional: + weight gain, + fatigue, no subjective hyperthermia, no subjective hypothermia, + nocturia Eyes: no blurry vision, no xerophthalmia ENT: no sore throat, no nodules palpated in throat, no dysphagia, no odynophagia, no hoarseness Cardiovascular: no CP/no SOB/no palpitations/+ leg swelling  Respiratory: no cough/no SOB/no wheezing Gastrointestinal: no N/no V/no D/no C/no acid reflux Musculoskeletal: no muscle aches/no joint aches Skin: no rashes, + hair loss >> but started to grow back after stopping ChTx Neurological: no tremors/no dizziness  I reviewed pt's medications, allergies, PMH, social hx, family hx, and changes were documented in the history of present illness. Otherwise, unchanged from my initial visit note.  Past Medical History:  Diagnosis Date  . Breast cancer ([place]) 11/2016   left  . GERD (gastroesophageal reflux disease)    TUMS as needed  . HTN (hypertension)    states BP has been high recently; has been on med. x 20 yr.  . Hyperlipidemia   . Insulin dependent diabetes mellitus ([place])    Past Surgical History:  Procedure Laterality Date  . BREAST LUMPECTOMY Left 05/05/2008  .  BREAST LUMPECTOMY WITH RADIOACTIVE SEED AND SENTINEL LYMPH NODE BIOPSY Left 12/07/2016   Procedure: LEFT BREAST LUMPECTOMY WITH RADIOACTIVE SEED AND SENTINEL LYMPH NODE BIOPSY;  Surgeon: Erroll Luna, MD;  Location: [facility];  Service: General;  Laterality: Left;  . IR FLUORO GUIDE PORT INSERTION RIGHT  01/04/2017  . IR US GUIDE VASC ACCESS RIGHT  01/04/2017  . RE-EXCISION OF BREAST LUMPECTOMY Left 12/28/2016   Procedure: RE-EXCISION OF BREAST LUMPECTOMY;  Surgeon: Erroll Luna, MD;  Location[facility];  Service: General;  Laterality: Left;   Social History   Social History  . Marital Status: Married    Spouse Name: N/A  . Number of Children: 4   . retired    Social History Main Topics  . Smoking status: Never Smoker   . Smokeless tobacco: Former Systems developer    Types: Snuff    Quit date: 10/02/2014     Comment: Quit 10/02/2014  previous 1 box per week of snuff   . Alcohol Use: No  . Drug Use: No   Current Outpatient Prescriptions on File Prior to Visit  Medication Sig Dispense Refill  . amLODipine (NORVASC) 10 MG tablet TAKE 1 TABLET (10 MG TOTAL) BY MOUTH DAILY. 30 tablet 5  . aspirin 81 MG tablet Take 81 mg by mouth daily.      . B-D INS SYRINGE 0.5CC/31GX5/16 31G X 5/16" 0.5 ML MISC USE AS DIRECTED 4 TIMES A DAY 300 each 1  . carvedilol (COREG) 25 MG tablet TAKE 1 TABLET BY MOUTH TWICE A DAY WITH A MEAL 180 tablet 1  . cloNIDine (CATAPRES) 0.3 MG tablet Take 0.3 mg by mouth 2 (two) times daily.    . hyaluronate sodium (RADIAPLEXRX) GEL Apply 1 application topically 2 (two) times daily.    . hydrochlorothiazide (HYDRODIURIL) 25 MG tablet Take 1 tablet (25 mg total) by mouth daily. 30 tablet 5  . HYDROcodone-acetaminophen (NORCO/VICODIN) 5-325 MG tablet Take 1-2 tablets by mouth every 6 (six) hours as needed for moderate pain. 20 tablet 0  . insulin aspart (NOVOLOG) 100 UNIT/ML injection Inject 5-6 Units into the skin 2 (two) times daily with a meal. 10 mL 1  . Insulin Glargine  (LANTUS SOLOSTAR) 100 UNIT/ML Solostar Pen Inject 24 Units into the skin every morning. 10 pen 0  . Insulin Pen Needle 31G X 5 MM MISC Use pen needles for insulin injection daily 100 each 3  . Insulin Syringe-Needle U-100 (B-D INS SYRINGE 0.5CC/30GX1/2") 30G X 1/2" 0.5 ML MISC USE AS DIRECTED 4 TIMES A DAY 300 each 3  . lidocaine-prilocaine (EMLA) cream Apply to affected area once 30 g 3  . lisinopril (  PRINIVIL,ZESTRIL) 40 MG tablet TAKE 1 TABLET (40 MG TOTAL) BY MOUTH DAILY. 30 tablet 5  . magnesium hydroxide (MILK OF MAGNESIA) 800 MG/5ML suspension Take 15 mLs by mouth.    . metFORMIN (GLUCOPHAGE) 500 MG tablet Take 500 mg by mouth 2 (two) times daily with a meal.    . non-metallic deodorant (ALRA) MISC Apply 1 application topically daily.    . ondansetron (ZOFRAN) 8 MG tablet Take 1 tablet (8 mg total) by mouth 2 (two) times daily as needed for refractory nausea / vomiting. Start on day 3 after chemo. 30 tablet 1  . OVER THE COUNTER MEDICATION Take 1 tablet by mouth daily as needed. Stool softner for constipation, also drinks warm prune juice prn    . prochlorperazine (COMPAZINE) 10 MG tablet Take 1 tablet (10 mg total) by mouth every 6 (six) hours as needed (Nausea or vomiting). 30 tablet 1  . rosuvastatin (CRESTOR) 5 MG tablet Take 1 tablet (5 mg total) by mouth daily. -- need labs for further refills 90 tablet 0  . traMADol (ULTRAM) 50 MG tablet Take 1 tablet (50 mg total) by mouth every 6 (six) hours as needed. 20 tablet 0   No current facility-administered medications on file prior to visit.    No Known Allergies Family History  Problem Relation Age of Onset  . Diabetes Sister        x 4  . Diabetes Brother   . Diabetes Paternal Grandmother   . Diabetes Paternal Aunt   . Heart disease Brother   . Heart disease Sister   . Emphysema Brother   . Cancer Father 30       brain tumor    PE: BP 130/74 (BP Location: Left Arm, Patient Position: Sitting)   Pulse 83   Ht 5' 1.5" (1.562  m)   Wt 176 lb (79.8 kg)   SpO2 98%   BMI 32.72 kg/m   Body mass index is 32.72 kg/m. Wt Readings from Last 3 Encounters:  04/25/17 176 lb (79.8 kg)  04/19/17 179 lb 11.2 oz (81.5 kg)  04/05/17 179 lb 6.4 oz (81.4 kg)   Constitutional: overweight, in NAD Eyes: PERRLA, EOMI, no exophthalmos ENT: moist mucous membranes, no thyromegaly, no cervical lymphadenopathy Cardiovascular: RRR, No MRG Respiratory: CTA B Gastrointestinal: abdomen soft, NT, ND, BS+ Musculoskeletal: no deformities, strength intact in all 4 Skin: moist, warm, no rashes Neurological: no tremor with outstretched hands, DTR normal in all 4  ASSESSMENT: 1. DM2, insulin-dependent, uncontrolled, with complications - + DR (severe, non-proliferative)  2. HL  PLAN:  1.  Patient with long-standing uncontrolled diabetes, on basal-bolus insulin regimen and also metformin, with better sugars at this visit, however still fluctuating. His sugars before dinner are high as she is missing her lunchtime insulin. She tells me she was not aware she needs to take it 3 times a day. She also takes her NovoLog approximately an hour before eating and I strongly advised her to move this at most 15 minutes before each meal. We'll also move the metformin to dinnertime. - I suggested to:  Patient Instructions  Please continue: - Metformin 1000 mg with dinner - Lantus 24 units in am  Please take Novolog 15 min before each of the 3 meals: - 6 units before a smaller meal - 8 units before a regular meal - 10 units before a large meal or if you have dessert Try to skip the snack at night, if sugars >100.  Please return in  3 months with your sugar log.   - today, HbA1c is 7.4% (better!) - continue checking sugars at different times of the day - check 3x a day, rotating checks - advised for yearly eye exams >> she is UTD but needs once - Return to clinic in 3 mo with sugar log    2. HL -  reviewed latest lipid panel with the patient >>  wonderful! - She is back on Crestor 5 mg no side effects no, previously muscle pains   Philemon Kingdom, MD PhD [facility]

## 2017-04-26 ENCOUNTER — Ambulatory Visit
Admission: RE | Admit: 2017-04-26 | Discharge: 2017-04-26 | Disposition: A | Discharge status: Home / Self Care | Payer: Medicare Other | Source: Ambulatory Visit | Attending: Radiation Oncology | Admitting: Radiation Oncology

## 2017-04-26 DIAGNOSIS — E785 Hyperlipidemia, unspecified: Secondary | ICD-10-CM | POA: Diagnosis not present

## 2017-04-26 DIAGNOSIS — E119 Type 2 diabetes mellitus without complications: Secondary | ICD-10-CM | POA: Diagnosis not present

## 2017-04-26 DIAGNOSIS — C50412 Malignant neoplasm of upper-outer quadrant of left female breast: Secondary | ICD-10-CM | POA: Diagnosis not present

## 2017-04-26 DIAGNOSIS — I10 Essential (primary) hypertension: Secondary | ICD-10-CM | POA: Diagnosis not present

## 2017-04-27 ENCOUNTER — Ambulatory Visit
Admission: RE | Admit: 2017-04-27 | Discharge: 2017-04-27 | Disposition: A | Discharge status: Home / Self Care | Payer: Medicare Other | Source: Ambulatory Visit | Attending: Radiation Oncology | Admitting: Radiation Oncology

## 2017-04-27 DIAGNOSIS — E119 Type 2 diabetes mellitus without complications: Secondary | ICD-10-CM | POA: Diagnosis not present

## 2017-04-27 DIAGNOSIS — I10 Essential (primary) hypertension: Secondary | ICD-10-CM | POA: Diagnosis not present

## 2017-04-27 DIAGNOSIS — C50412 Malignant neoplasm of upper-outer quadrant of left female breast: Secondary | ICD-10-CM | POA: Diagnosis not present

## 2017-04-27 DIAGNOSIS — E785 Hyperlipidemia, unspecified: Secondary | ICD-10-CM | POA: Diagnosis not present

## 2017-04-30 ENCOUNTER — Other Ambulatory Visit: Payer: Self-pay

## 2017-04-30 ENCOUNTER — Ambulatory Visit
Admission: RE | Admit: 2017-04-30 | Discharge: 2017-04-30 | Disposition: A | Discharge status: Home / Self Care | Payer: Medicare Other | Source: Ambulatory Visit | Attending: Radiation Oncology | Admitting: Radiation Oncology

## 2017-04-30 ENCOUNTER — Telehealth: Payer: Self-pay | Admitting: Internal Medicine

## 2017-04-30 DIAGNOSIS — E119 Type 2 diabetes mellitus without complications: Secondary | ICD-10-CM | POA: Diagnosis not present

## 2017-04-30 DIAGNOSIS — E785 Hyperlipidemia, unspecified: Secondary | ICD-10-CM | POA: Diagnosis not present

## 2017-04-30 DIAGNOSIS — I10 Essential (primary) hypertension: Secondary | ICD-10-CM | POA: Diagnosis not present

## 2017-04-30 DIAGNOSIS — C50412 Malignant neoplasm of upper-outer quadrant of left female breast: Secondary | ICD-10-CM | POA: Diagnosis not present

## 2017-04-30 MED ORDER — INSULIN GLARGINE 100 UNIT/ML SOLOSTAR PEN: PEN_INJECTOR | SUBCUTANEOUS | 0 refills | Status: DC

## 2017-04-30 NOTE — Telephone Encounter (Signed)
Submitted in for 30 day.

## 2017-04-30 NOTE — Telephone Encounter (Signed)
Please advise?  If we called in a lower month supply, would that help?

## 2017-04-30 NOTE — Telephone Encounter (Signed)
Pt has lantus 3 mon supply but it is a tier 3 med. Med is $140 can she cannot afford this. Please assist. Insurance did not express a cheaper alternate when she called them.

## 2017-04-30 NOTE — Telephone Encounter (Signed)
Yes, let's try 

## 2017-04-30 NOTE — Telephone Encounter (Signed)
Called patient daughter and explained we were sending in a 30 day supply instead of 90 day. They were going to check on how expensive this would be.

## 2017-05-01 ENCOUNTER — Telehealth: Payer: Self-pay | Admitting: Internal Medicine

## 2017-05-01 ENCOUNTER — Other Ambulatory Visit: Payer: Self-pay

## 2017-05-01 ENCOUNTER — Ambulatory Visit
Admission: RE | Admit: 2017-05-01 | Discharge: 2017-05-01 | Disposition: A | Discharge status: Home / Self Care | Payer: Medicare Other | Source: Ambulatory Visit | Attending: Radiation Oncology | Admitting: Radiation Oncology

## 2017-05-01 DIAGNOSIS — C50412 Malignant neoplasm of upper-outer quadrant of left female breast: Secondary | ICD-10-CM | POA: Diagnosis not present

## 2017-05-01 DIAGNOSIS — E119 Type 2 diabetes mellitus without complications: Secondary | ICD-10-CM | POA: Diagnosis not present

## 2017-05-01 DIAGNOSIS — I10 Essential (primary) hypertension: Secondary | ICD-10-CM | POA: Diagnosis not present

## 2017-05-01 DIAGNOSIS — E785 Hyperlipidemia, unspecified: Secondary | ICD-10-CM | POA: Diagnosis not present

## 2017-05-01 MED ORDER — INSULIN GLARGINE 100 UNIT/ML SOLOSTAR PEN: PEN_INJECTOR | SUBCUTANEOUS | 0 refills | Status: DC

## 2017-05-01 NOTE — Telephone Encounter (Signed)
Submitted

## 2017-05-01 NOTE — Telephone Encounter (Signed)
Patient calling b/c Lantus Solostart 1 mos supply script, has not been called in.  Please advise.  [facility]  Thank you,   -[place]

## 2017-05-02 ENCOUNTER — Ambulatory Visit
Admission: RE | Admit: 2017-05-02 | Discharge: 2017-05-02 | Disposition: A | Discharge status: Home / Self Care | Payer: Medicare Other | Source: Ambulatory Visit | Attending: Radiation Oncology | Admitting: Radiation Oncology

## 2017-05-02 DIAGNOSIS — E785 Hyperlipidemia, unspecified: Secondary | ICD-10-CM | POA: Diagnosis not present

## 2017-05-02 DIAGNOSIS — C50412 Malignant neoplasm of upper-outer quadrant of left female breast: Secondary | ICD-10-CM | POA: Diagnosis not present

## 2017-05-02 DIAGNOSIS — E119 Type 2 diabetes mellitus without complications: Secondary | ICD-10-CM | POA: Diagnosis not present

## 2017-05-02 DIAGNOSIS — I10 Essential (primary) hypertension: Secondary | ICD-10-CM | POA: Diagnosis not present

## 2017-05-03 ENCOUNTER — Ambulatory Visit
Admission: RE | Admit: 2017-05-03 | Discharge: 2017-05-03 | Disposition: A | Discharge status: Home / Self Care | Payer: Medicare Other | Source: Ambulatory Visit | Attending: Radiation Oncology | Admitting: Radiation Oncology

## 2017-05-03 DIAGNOSIS — C50412 Malignant neoplasm of upper-outer quadrant of left female breast: Secondary | ICD-10-CM | POA: Diagnosis not present

## 2017-05-03 DIAGNOSIS — I10 Essential (primary) hypertension: Secondary | ICD-10-CM | POA: Diagnosis not present

## 2017-05-03 DIAGNOSIS — E785 Hyperlipidemia, unspecified: Secondary | ICD-10-CM | POA: Diagnosis not present

## 2017-05-03 DIAGNOSIS — E119 Type 2 diabetes mellitus without complications: Secondary | ICD-10-CM | POA: Diagnosis not present

## 2017-05-04 ENCOUNTER — Ambulatory Visit
Admission: RE | Admit: 2017-05-04 | Discharge: 2017-05-04 | Disposition: A | Discharge status: Home / Self Care | Payer: Medicare Other | Source: Ambulatory Visit | Attending: Radiation Oncology | Admitting: Radiation Oncology

## 2017-05-04 DIAGNOSIS — C50412 Malignant neoplasm of upper-outer quadrant of left female breast: Secondary | ICD-10-CM | POA: Diagnosis not present

## 2017-05-04 DIAGNOSIS — I10 Essential (primary) hypertension: Secondary | ICD-10-CM | POA: Diagnosis not present

## 2017-05-04 DIAGNOSIS — E785 Hyperlipidemia, unspecified: Secondary | ICD-10-CM | POA: Diagnosis not present

## 2017-05-04 DIAGNOSIS — E119 Type 2 diabetes mellitus without complications: Secondary | ICD-10-CM | POA: Diagnosis not present

## 2017-05-07 ENCOUNTER — Ambulatory Visit
Admission: RE | Admit: 2017-05-07 | Discharge: 2017-05-07 | Disposition: A | Discharge status: Home / Self Care | Payer: Medicare Other | Source: Ambulatory Visit | Attending: Radiation Oncology | Admitting: Radiation Oncology

## 2017-05-07 DIAGNOSIS — C50412 Malignant neoplasm of upper-outer quadrant of left female breast: Secondary | ICD-10-CM | POA: Diagnosis not present

## 2017-05-07 DIAGNOSIS — E119 Type 2 diabetes mellitus without complications: Secondary | ICD-10-CM | POA: Diagnosis not present

## 2017-05-07 DIAGNOSIS — I10 Essential (primary) hypertension: Secondary | ICD-10-CM | POA: Diagnosis not present

## 2017-05-07 DIAGNOSIS — E785 Hyperlipidemia, unspecified: Secondary | ICD-10-CM | POA: Diagnosis not present

## 2017-05-08 ENCOUNTER — Ambulatory Visit
Admission: RE | Admit: 2017-05-08 | Discharge: 2017-05-08 | Disposition: A | Discharge status: Home / Self Care | Payer: Medicare Other | Source: Ambulatory Visit | Attending: Radiation Oncology | Admitting: Radiation Oncology

## 2017-05-08 DIAGNOSIS — I10 Essential (primary) hypertension: Secondary | ICD-10-CM | POA: Diagnosis not present

## 2017-05-08 DIAGNOSIS — C50412 Malignant neoplasm of upper-outer quadrant of left female breast: Secondary | ICD-10-CM | POA: Diagnosis not present

## 2017-05-08 DIAGNOSIS — E119 Type 2 diabetes mellitus without complications: Secondary | ICD-10-CM | POA: Diagnosis not present

## 2017-05-08 DIAGNOSIS — E785 Hyperlipidemia, unspecified: Secondary | ICD-10-CM | POA: Diagnosis not present

## 2017-05-09 ENCOUNTER — Ambulatory Visit
Admission: RE | Admit: 2017-05-09 | Discharge: 2017-05-09 | Disposition: A | Discharge status: Home / Self Care | Payer: Medicare Other | Source: Ambulatory Visit | Attending: Radiation Oncology | Admitting: Radiation Oncology

## 2017-05-09 ENCOUNTER — Other Ambulatory Visit: Payer: Self-pay | Admitting: Internal Medicine

## 2017-05-09 DIAGNOSIS — E119 Type 2 diabetes mellitus without complications: Secondary | ICD-10-CM | POA: Diagnosis not present

## 2017-05-09 DIAGNOSIS — I10 Essential (primary) hypertension: Secondary | ICD-10-CM | POA: Diagnosis not present

## 2017-05-09 DIAGNOSIS — C50412 Malignant neoplasm of upper-outer quadrant of left female breast: Secondary | ICD-10-CM | POA: Diagnosis not present

## 2017-05-09 DIAGNOSIS — E785 Hyperlipidemia, unspecified: Secondary | ICD-10-CM | POA: Diagnosis not present

## 2017-05-10 ENCOUNTER — Ambulatory Visit
Admission: RE | Admit: 2017-05-10 | Discharge: 2017-05-10 | Disposition: A | Discharge status: Home / Self Care | Payer: Medicare Other | Source: Ambulatory Visit | Attending: Radiation Oncology | Admitting: Radiation Oncology

## 2017-05-10 DIAGNOSIS — E119 Type 2 diabetes mellitus without complications: Secondary | ICD-10-CM | POA: Diagnosis not present

## 2017-05-10 DIAGNOSIS — C50412 Malignant neoplasm of upper-outer quadrant of left female breast: Secondary | ICD-10-CM | POA: Diagnosis not present

## 2017-05-10 DIAGNOSIS — I10 Essential (primary) hypertension: Secondary | ICD-10-CM | POA: Diagnosis not present

## 2017-05-10 DIAGNOSIS — E785 Hyperlipidemia, unspecified: Secondary | ICD-10-CM | POA: Diagnosis not present

## 2017-05-11 ENCOUNTER — Ambulatory Visit
Admission: RE | Admit: 2017-05-11 | Discharge: 2017-05-11 | Disposition: A | Discharge status: Home / Self Care | Payer: Medicare Other | Source: Ambulatory Visit | Attending: Radiation Oncology | Admitting: Radiation Oncology

## 2017-05-11 DIAGNOSIS — E785 Hyperlipidemia, unspecified: Secondary | ICD-10-CM | POA: Diagnosis not present

## 2017-05-11 DIAGNOSIS — E119 Type 2 diabetes mellitus without complications: Secondary | ICD-10-CM | POA: Diagnosis not present

## 2017-05-11 DIAGNOSIS — I10 Essential (primary) hypertension: Secondary | ICD-10-CM | POA: Diagnosis not present

## 2017-05-11 DIAGNOSIS — C50412 Malignant neoplasm of upper-outer quadrant of left female breast: Secondary | ICD-10-CM | POA: Diagnosis not present

## 2017-05-14 ENCOUNTER — Ambulatory Visit
Admission: RE | Admit: 2017-05-14 | Discharge: 2017-05-14 | Disposition: A | Discharge status: Home / Self Care | Payer: Medicare Other | Source: Ambulatory Visit | Attending: Radiation Oncology | Admitting: Radiation Oncology

## 2017-05-14 DIAGNOSIS — E119 Type 2 diabetes mellitus without complications: Secondary | ICD-10-CM | POA: Diagnosis not present

## 2017-05-14 DIAGNOSIS — E785 Hyperlipidemia, unspecified: Secondary | ICD-10-CM | POA: Diagnosis not present

## 2017-05-14 DIAGNOSIS — I10 Essential (primary) hypertension: Secondary | ICD-10-CM | POA: Diagnosis not present

## 2017-05-14 DIAGNOSIS — C50412 Malignant neoplasm of upper-outer quadrant of left female breast: Secondary | ICD-10-CM | POA: Diagnosis not present

## 2017-05-15 ENCOUNTER — Ambulatory Visit
Admission: RE | Admit: 2017-05-15 | Discharge: 2017-05-15 | Disposition: A | Discharge status: Home / Self Care | Payer: Medicare Other | Source: Ambulatory Visit | Attending: Radiation Oncology | Admitting: Radiation Oncology

## 2017-05-15 DIAGNOSIS — I10 Essential (primary) hypertension: Secondary | ICD-10-CM | POA: Diagnosis not present

## 2017-05-15 DIAGNOSIS — E119 Type 2 diabetes mellitus without complications: Secondary | ICD-10-CM | POA: Diagnosis not present

## 2017-05-15 DIAGNOSIS — C50412 Malignant neoplasm of upper-outer quadrant of left female breast: Secondary | ICD-10-CM | POA: Diagnosis not present

## 2017-05-15 DIAGNOSIS — E785 Hyperlipidemia, unspecified: Secondary | ICD-10-CM | POA: Diagnosis not present

## 2017-05-16 ENCOUNTER — Ambulatory Visit
Admission: RE | Admit: 2017-05-16 | Discharge: 2017-05-16 | Disposition: A | Discharge status: Home / Self Care | Payer: Medicare Other | Source: Ambulatory Visit | Attending: Radiation Oncology | Admitting: Radiation Oncology

## 2017-05-16 DIAGNOSIS — I10 Essential (primary) hypertension: Secondary | ICD-10-CM | POA: Diagnosis not present

## 2017-05-16 DIAGNOSIS — C50412 Malignant neoplasm of upper-outer quadrant of left female breast: Secondary | ICD-10-CM | POA: Diagnosis not present

## 2017-05-16 DIAGNOSIS — E119 Type 2 diabetes mellitus without complications: Secondary | ICD-10-CM | POA: Diagnosis not present

## 2017-05-16 DIAGNOSIS — E785 Hyperlipidemia, unspecified: Secondary | ICD-10-CM | POA: Diagnosis not present

## 2017-05-17 ENCOUNTER — Ambulatory Visit
Admission: RE | Admit: 2017-05-17 | Discharge: 2017-05-17 | Disposition: A | Discharge status: Home / Self Care | Payer: Medicare Other | Source: Ambulatory Visit | Attending: Radiation Oncology | Admitting: Radiation Oncology

## 2017-05-17 DIAGNOSIS — E785 Hyperlipidemia, unspecified: Secondary | ICD-10-CM | POA: Diagnosis not present

## 2017-05-17 DIAGNOSIS — I10 Essential (primary) hypertension: Secondary | ICD-10-CM | POA: Diagnosis not present

## 2017-05-17 DIAGNOSIS — E119 Type 2 diabetes mellitus without complications: Secondary | ICD-10-CM | POA: Diagnosis not present

## 2017-05-17 DIAGNOSIS — C50412 Malignant neoplasm of upper-outer quadrant of left female breast: Secondary | ICD-10-CM | POA: Diagnosis not present

## 2017-05-18 ENCOUNTER — Ambulatory Visit
Admission: RE | Admit: 2017-05-18 | Discharge: 2017-05-18 | Disposition: A | Discharge status: Home / Self Care | Payer: Medicare Other | Source: Ambulatory Visit | Attending: Radiation Oncology | Admitting: Radiation Oncology

## 2017-05-18 ENCOUNTER — Encounter: Payer: Self-pay | Admitting: Radiation Oncology

## 2017-05-18 DIAGNOSIS — C50412 Malignant neoplasm of upper-outer quadrant of left female breast: Secondary | ICD-10-CM | POA: Diagnosis not present

## 2017-05-18 DIAGNOSIS — E785 Hyperlipidemia, unspecified: Secondary | ICD-10-CM | POA: Diagnosis not present

## 2017-05-18 DIAGNOSIS — E119 Type 2 diabetes mellitus without complications: Secondary | ICD-10-CM | POA: Diagnosis not present

## 2017-05-18 DIAGNOSIS — I10 Essential (primary) hypertension: Secondary | ICD-10-CM | POA: Diagnosis not present

## 2017-05-21 ENCOUNTER — Ambulatory Visit: Payer: Self-pay | Admitting: Surgery

## 2017-05-21 ENCOUNTER — Ambulatory Visit: Payer: Medicare Other

## 2017-05-21 DIAGNOSIS — Z95828 Presence of other vascular implants and grafts: Secondary | ICD-10-CM | POA: Diagnosis not present

## 2017-05-21 DIAGNOSIS — Z853 Personal history of malignant neoplasm of breast: Secondary | ICD-10-CM | POA: Diagnosis not present

## 2017-05-21 NOTE — H&P (Signed)
Wendy [name] 05/21/2017 2:15 PM Location: [facility] Patient #: [id number] DOB: 1941/08/06 Married / Language: English / Race: Black or African American Female  History of Present Illness Wendy Moores A. Ifeanyi Mickelson MD; 05/21/2017 2:37 PM) Patient words: Patient returns for follow-up of her left breast cancer status post breast conserving surgery, chemotherapy and radiation therapy. She has finished radiation therapy earlier this month. She has a sore left breast she states and occasional drainage from her nipple. This appears clear. She denies fever or chills.  Her oncology note:            Malignant neoplasm of upper-outer quadrant of left breast, Invasive Ductal Carcinoma, pT2pN1M0, stage IIB, ER+/PR-/HER2-, G3, mammaprint high risk  -Left lumpectomy and SLN biopsy on 12/07/16 revealed grade 3 IDC measuring 3.4 cm, carcinoma was broadly present at the superior margin, grade 3 DCIS, and 1 out of 2 left axillary SLNs were positive for metastatic carcinoma. With carcinoma broadly present at the superior margin. Excision of the left superior margin on 12/28/16 showed no evidence of malignancy. -Mammaprint showed high risk disease, basal type.The average 10 year risk of recurrence without adjuvant therapy is 29%.  The patient is a 76 year old female.   Allergies Malachy Moan, [place]; 05/21/2017 2:15 PM) Allergies Reconciled No Known Allergies 11/13/2016  Medication History Malachy Moan, [place]; 05/21/2017 2:15 PM) AmLODIPine Besylate (10MG Tablet, Oral) Active. Basaglar KwikPen (100UNIT/ML Soln Pen-inj, Subcutaneous) Active. BD Insulin Syringe (30G X 1/2"0.5 ML Misc,) Active. BD Insulin Syr Ultrafine II (31G X 5/16"0.5 ML Misc,) Active. Carvedilol (25MG Tablet, Oral) Active. CloNIDine HCl (0.3MG Tablet, Oral) Active. HydroCHLOROthiazide (25MG Tablet, Oral) Active. Lantus (100UNIT/ML Solution, Subcutaneous) Active. Lisinopril (40MG Tablet, Oral) Active. MetFORMIN  HCl (500MG Tablet, Oral) Active. NovoLOG (100UNIT/ML Solution, Subcutaneous) Active. Rosuvastatin Calcium (5MG Tablet, Oral) Active. Medications Reconciled    Vitals Malachy Moan RMA; 05/21/2017 2:16 PM) 05/21/2017 2:15 PM Weight: 175.2 lb Height: 62in Body Surface Area: 1.81 m Body Mass Index: 32.04 kg/m  Temp.: 98.25F  Pulse: 72 (Regular)  BP: 136/80 (Sitting, Left Arm, Standard)      Physical Exam (Azya Barbero A. Nayelis Bonito MD; 05/21/2017 2:37 PM)  General Mental Status-Alert. General Appearance-Consistent with stated age. Hydration-Well hydrated. Voice-Normal.  Chest and Lung Exam Note: Port under right clavicle noted.  Breast Note: Left breast has postradiation change and volume loss. Incision is healed. Small persistent seroma noted. Radiation skin changes noted. Right breast is normal.  Lymphatic Head & Neck  General Head & Neck Lymphatics: Bilateral - Description - Normal. Axillary  General Axillary Region: Bilateral - Description - Normal. Tenderness - Non Tender.    Assessment & Plan ([place] Mielke A. Nazifa Trinka MD; 05/21/2017 2:38 PM)  HISTORY OF BREAST CANCER (Z85.3) Impression: Appears stable. She is now on antiestrogen. Continue follow-up with Dr. Burr Medico  Current Plans I recommended surgery to remove the catheter. I explained the technique of removal with use of local anesthesia & possible need for more aggressive sedation/anesthesia for patient comfort.  Risks such as bleeding, infection, and other risks were discussed. Post-operative dressing/incision care was discussed. I noted a good likelihood this will help address the problem. We will work to minimize complications. Questions were answered. The patient expresses understanding & wishes to proceed with surgery.  Pt Education - [facility] Free Text Education/Instructions: discussed with patient and provided information. PORT-A-CATH IN PLACE [phone]) Impression: Scheduled for port  removal.  Risk of bleeding, infection, catheter fragmentation, Embolization, need further surgeries and are partially discussed.

## 2017-05-22 NOTE — Progress Notes (Signed)
  Radiation Oncology         [phone] ________________________________  Name: Wendy [name] MRN: [id number]  Date: 05/18/2017  DOB: 11-10-1940  End of Treatment Note  Diagnosis:   76 y.o. woman with Malignant neoplasm of upper-outer quadrant of left breast, estrogen receptor positive ([place])     Indication for treatment:  Curative       Radiation treatment dates:   04/23/17 - 05/18/16  Site/dose:    Left breast/ 42.5 Gy in 17 Fx Boost / 7.5 Gy in 3 Fx  Beams/energy:  Photon//  6X 3D   Narrative: The patient tolerated radiation treatment relatively well. Patient denied having any pain. Skin was hyperpigmented, and had itching under her arm. She stated that she was going to start on hydrocortisone cream. Patient did report having fatigue, and maintaining a good appetite.  Plan: The patient has completed radiation treatment. The patient will return to radiation oncology clinic for routine followup in one month. I advised them to call or return sooner if they have any questions or concerns related to their recovery or treatment.  ------------------------------------------------  Jodelle Gross, MD, PhD   This document serves as a record of services personally performed by Kyung Rudd MD. It was created on his behalf by Delton Coombes, a trained medical scribe. The creation of this record is based on the scribe's personal observations and the provider's statements to them. This document has been checked and approved by the attending provider.

## 2017-05-24 ENCOUNTER — Other Ambulatory Visit: Payer: Self-pay | Admitting: Internal Medicine

## 2017-05-24 ENCOUNTER — Encounter (HOSPITAL_BASED_OUTPATIENT_CLINIC_OR_DEPARTMENT_OTHER): Payer: Self-pay | Admitting: *Deleted

## 2017-05-30 ENCOUNTER — Encounter (HOSPITAL_BASED_OUTPATIENT_CLINIC_OR_DEPARTMENT_OTHER): Payer: Self-pay | Admitting: Anesthesiology

## 2017-05-30 ENCOUNTER — Ambulatory Visit (HOSPITAL_BASED_OUTPATIENT_CLINIC_OR_DEPARTMENT_OTHER): Payer: Medicare Other | Admitting: Anesthesiology

## 2017-05-30 ENCOUNTER — Ambulatory Visit (HOSPITAL_BASED_OUTPATIENT_CLINIC_OR_DEPARTMENT_OTHER)
Admission: RE | Admit: 2017-05-30 | Discharge: 2017-05-30 | Disposition: A | Discharge status: Home / Self Care | Payer: Medicare Other | Source: Ambulatory Visit | Attending: Surgery | Admitting: Surgery

## 2017-05-30 ENCOUNTER — Encounter (HOSPITAL_BASED_OUTPATIENT_CLINIC_OR_DEPARTMENT_OTHER)
Admission: RE | Disposition: A | Discharge status: Home / Self Care | Payer: Self-pay | Source: Ambulatory Visit | Attending: Surgery

## 2017-05-30 DIAGNOSIS — I129 Hypertensive chronic kidney disease with stage 1 through stage 4 chronic kidney disease, or unspecified chronic kidney disease: Secondary | ICD-10-CM | POA: Diagnosis not present

## 2017-05-30 DIAGNOSIS — N183 Chronic kidney disease, stage 3 (moderate): Secondary | ICD-10-CM | POA: Diagnosis not present

## 2017-05-30 DIAGNOSIS — E119 Type 2 diabetes mellitus without complications: Secondary | ICD-10-CM | POA: Diagnosis not present

## 2017-05-30 DIAGNOSIS — Z6832 Body mass index (BMI) 32.0-32.9, adult: Secondary | ICD-10-CM | POA: Diagnosis not present

## 2017-05-30 DIAGNOSIS — K219 Gastro-esophageal reflux disease without esophagitis: Secondary | ICD-10-CM | POA: Diagnosis not present

## 2017-05-30 DIAGNOSIS — E669 Obesity, unspecified: Secondary | ICD-10-CM | POA: Insufficient documentation

## 2017-05-30 DIAGNOSIS — Z794 Long term (current) use of insulin: Secondary | ICD-10-CM | POA: Insufficient documentation

## 2017-05-30 DIAGNOSIS — Z452 Encounter for adjustment and management of vascular access device: Secondary | ICD-10-CM | POA: Diagnosis not present

## 2017-05-30 DIAGNOSIS — Z923 Personal history of irradiation: Secondary | ICD-10-CM | POA: Diagnosis not present

## 2017-05-30 DIAGNOSIS — I509 Heart failure, unspecified: Secondary | ICD-10-CM | POA: Insufficient documentation

## 2017-05-30 DIAGNOSIS — E1122 Type 2 diabetes mellitus with diabetic chronic kidney disease: Secondary | ICD-10-CM | POA: Diagnosis not present

## 2017-05-30 DIAGNOSIS — I110 Hypertensive heart disease with heart failure: Secondary | ICD-10-CM | POA: Insufficient documentation

## 2017-05-30 DIAGNOSIS — Z9221 Personal history of antineoplastic chemotherapy: Secondary | ICD-10-CM | POA: Diagnosis not present

## 2017-05-30 DIAGNOSIS — Z853 Personal history of malignant neoplasm of breast: Secondary | ICD-10-CM | POA: Diagnosis not present

## 2017-05-30 DIAGNOSIS — Z79899 Other long term (current) drug therapy: Secondary | ICD-10-CM | POA: Insufficient documentation

## 2017-05-30 HISTORY — PX: PORT-A-CATH REMOVAL: SHX5289

## 2017-05-30 LAB — GLUCOSE, CAPILLARY
GLUCOSE-CAPILLARY: 243 mg/dL — AB (ref 65–99)
GLUCOSE-CAPILLARY: 262 mg/dL — AB (ref 65–99)

## 2017-05-30 SURGERY — REMOVAL PORT-A-CATH
Anesthesia: Monitor Anesthesia Care | Site: Chest

## 2017-05-30 MED ORDER — CHLORHEXIDINE GLUCONATE CLOTH 2 % EX PADS: 6.0000 | MEDICATED_PAD | Freq: Once | CUTANEOUS | Status: DC

## 2017-05-30 MED ORDER — FENTANYL CITRATE (PF) 100 MCG/2ML IJ SOLN
INTRAMUSCULAR | Status: AC
  Filled 2017-05-30: qty 2

## 2017-05-30 MED ORDER — ONDANSETRON HCL 4 MG/2ML IJ SOLN
INTRAMUSCULAR | Status: AC
  Filled 2017-05-30: qty 2

## 2017-05-30 MED ORDER — ONDANSETRON HCL 4 MG/2ML IJ SOLN
INTRAMUSCULAR | Status: DC | PRN
  Administered 2017-05-30: 4 mg via INTRAVENOUS

## 2017-05-30 MED ORDER — LACTATED RINGERS IV SOLN
INTRAVENOUS | Status: DC
  Administered 2017-05-30: 12:00:00 via INTRAVENOUS

## 2017-05-30 MED ORDER — LIDOCAINE 2% (20 MG/ML) 5 ML SYRINGE
INTRAMUSCULAR | Status: AC
  Filled 2017-05-30: qty 5

## 2017-05-30 MED ORDER — SCOPOLAMINE 1 MG/3DAYS TD PT72: 1.0000 | MEDICATED_PATCH | Freq: Once | TRANSDERMAL | Status: DC | PRN

## 2017-05-30 MED ORDER — MEPERIDINE HCL 25 MG/ML IJ SOLN: 6.2500 mg | INTRAMUSCULAR | Status: DC | PRN

## 2017-05-30 MED ORDER — FENTANYL CITRATE (PF) 100 MCG/2ML IJ SOLN: 50.0000 ug | INTRAMUSCULAR | Status: DC | PRN

## 2017-05-30 MED ORDER — PROPOFOL 10 MG/ML IV BOLUS
INTRAVENOUS | Status: AC
  Filled 2017-05-30: qty 20

## 2017-05-30 MED ORDER — MIDAZOLAM HCL 2 MG/2ML IJ SOLN: 1.0000 mg | INTRAMUSCULAR | Status: DC | PRN

## 2017-05-30 MED ORDER — CEFAZOLIN SODIUM-DEXTROSE 2-4 GM/100ML-% IV SOLN
INTRAVENOUS | Status: AC
  Filled 2017-05-30: qty 100

## 2017-05-30 MED ORDER — BUPIVACAINE-EPINEPHRINE 0.25% -1:200000 IJ SOLN
INTRAMUSCULAR | Status: DC | PRN
  Administered 2017-05-30: 10 mL

## 2017-05-30 MED ORDER — PROPOFOL 500 MG/50ML IV EMUL
INTRAVENOUS | Status: DC | PRN
  Administered 2017-05-30: 75 ug/kg/min via INTRAVENOUS

## 2017-05-30 MED ORDER — LIDOCAINE HCL (CARDIAC) 20 MG/ML IV SOLN
INTRAVENOUS | Status: DC | PRN
  Administered 2017-05-30: 30 mg via INTRAVENOUS

## 2017-05-30 MED ORDER — FENTANYL CITRATE (PF) 100 MCG/2ML IJ SOLN: 25.0000 ug | INTRAMUSCULAR | Status: DC | PRN

## 2017-05-30 MED ORDER — DEXTROSE 5 % IV SOLN
3.0000 g | INTRAVENOUS | Status: AC
  Administered 2017-05-30: 2 g via INTRAVENOUS

## 2017-05-30 SURGICAL SUPPLY — 34 items
BENZOIN TINCTURE PRP APPL 2/3 (GAUZE/BANDAGES/DRESSINGS) IMPLANT
BLADE SURG 15 STRL LF DISP TIS (BLADE) ×1 IMPLANT
BLADE SURG 15 STRL SS (BLADE) ×2
CHLORAPREP W/TINT 26ML (MISCELLANEOUS) ×3 IMPLANT
CLOSURE WOUND 1/2 X4 (GAUZE/BANDAGES/DRESSINGS)
COVER BACK TABLE 60X90IN (DRAPES) ×3 IMPLANT
COVER MAYO STAND STRL (DRAPES) ×3 IMPLANT
DECANTER SPIKE VIAL GLASS SM (MISCELLANEOUS) ×3 IMPLANT
DERMABOND ADVANCED (GAUZE/BANDAGES/DRESSINGS) ×2
DERMABOND ADVANCED .7 DNX12 (GAUZE/BANDAGES/DRESSINGS) ×1 IMPLANT
DRAPE LAPAROTOMY 100X72 PEDS (DRAPES) ×3 IMPLANT
DRAPE UTILITY XL STRL (DRAPES) ×3 IMPLANT
ELECT REM PT RETURN 9FT ADLT (ELECTROSURGICAL)
ELECTRODE REM PT RTRN 9FT ADLT (ELECTROSURGICAL) IMPLANT
GLOVE BIOGEL PI IND STRL 7.0 (GLOVE) ×2 IMPLANT
GLOVE BIOGEL PI IND STRL 8 (GLOVE) ×1 IMPLANT
GLOVE BIOGEL PI INDICATOR 7.0 (GLOVE) ×4
GLOVE BIOGEL PI INDICATOR 8 (GLOVE) ×2
GLOVE ECLIPSE 6.5 STRL STRAW (GLOVE) ×3 IMPLANT
GLOVE ECLIPSE 8.0 STRL XLNG CF (GLOVE) ×3 IMPLANT
GOWN STRL REUS W/ TWL LRG LVL3 (GOWN DISPOSABLE) ×2 IMPLANT
GOWN STRL REUS W/TWL LRG LVL3 (GOWN DISPOSABLE) ×4
NEEDLE HYPO 25X1 1.5 SAFETY (NEEDLE) ×3 IMPLANT
NS IRRIG 1000ML POUR BTL (IV SOLUTION) ×3 IMPLANT
PACK BASIN DAY SURGERY FS (CUSTOM PROCEDURE TRAY) ×3 IMPLANT
PENCIL BUTTON HOLSTER BLD 10FT (ELECTRODE) IMPLANT
SLEEVE SCD COMPRESS KNEE MED (MISCELLANEOUS) IMPLANT
SPONGE LAP 4X18 X RAY DECT (DISPOSABLE) ×3 IMPLANT
STRIP CLOSURE SKIN 1/2X4 (GAUZE/BANDAGES/DRESSINGS) IMPLANT
SUT MON AB 4-0 PC3 18 (SUTURE) ×3 IMPLANT
SUT VICRYL 3-0 CR8 SH (SUTURE) ×3 IMPLANT
SYR CONTROL 10ML LL (SYRINGE) ×3 IMPLANT
TOWEL OR 17X24 6PK STRL BLUE (TOWEL DISPOSABLE) ×3 IMPLANT
TOWEL OR NON WOVEN STRL DISP B (DISPOSABLE) ×3 IMPLANT

## 2017-05-30 NOTE — Interval H&P Note (Signed)
History and Physical Interval Note:  05/30/2017 12:24 PM  Wendy [name]  has presented today for surgery, with the diagnosis of port  The various methods of treatment have been discussed with the patient and family. After consideration of risks, benefits and other options for treatment, the patient has consented to  Procedure(s): REMOVAL PORT-A-CATH (N/A) as a surgical intervention .  The patient's history has been reviewed, patient examined, no change in status, stable for surgery.  I have reviewed the patient's chart and labs.  Questions were answered to the patient's satisfaction.     [name]

## 2017-05-30 NOTE — Op Note (Signed)
Preop diagnosis: Indwelling port a catheter for chemotherapy  Postop diagnosis: Same  Procedure: Removal of port a catheter  Surgeon: Erroll Luna M.D.  Anesthesia: MAC with local  EBL: Minimal  Specimen none  Drains: None  Indications for procedure: The patient presents for removal of port a catheter after completing chemotherapy. The patient no longer requires central venous access. Risks of bleeding, infection, catheter fragmentation, embolization, arrhythmias and damage to arteries, veins and nerves and possibly other mediastinal structures discussed. The patient agrees to proceed.  Description of procedure: The patient was seen in the holding area. Questions were answered. The patient agreed to proceed. The patient was taken to the operating room. The patient was placed supine. Anesthesia was initiated. The skin on the upper chest was prepped and draped in a sterile fashion. Timeout was done. The patient received preoperative antibiotics. Incision was made through the old port site and the hub of the Port-A-Cath was seen. The sutures were cut to release the port from the chest wall. The catheter was removed in its entirety without difficulty. The tract was closed with 3-0 Vicryl. 4 Monocryl was used to close the skin. All final counts were correct. The patient was taken to recovery in satisfactory condition.

## 2017-05-30 NOTE — Transfer of Care (Signed)
Immediate Anesthesia Transfer of Care Note  Patient: Wendy [name]  Procedure(s) Performed: Procedure(s): REMOVAL PORT-A-CATH (N/A)  Patient Location: PACU  Anesthesia Type:MAC  Level of Consciousness: awake, alert , oriented and patient cooperative  Airway & Oxygen Therapy: Patient Spontanous Breathing and Patient connected to face mask oxygen  Post-op Assessment: Report given to RN and Post -op Vital signs reviewed and stable  Post vital signs: Reviewed and stable  Last Vitals:  Vitals:   05/30/17 1153  BP: (!) 110/50  Pulse: 62  Resp: 16  Temp: 36.6 C  SpO2: 100%    Last Pain:  Vitals:   05/30/17 1153  TempSrc: Oral      Patients Stated Pain Goal: 0 (84/66/59 [id number])  Complications: No apparent anesthesia complications

## 2017-05-30 NOTE — Anesthesia Preprocedure Evaluation (Signed)
Anesthesia Evaluation  Patient identified by MRN, date of birth, ID band Patient awake    Reviewed: Allergy & Precautions, NPO status , Patient's Chart, lab work & pertinent test results, reviewed documented beta blocker date and time   Airway Mallampati: II  TM Distance: >3 FB Neck ROM: Full    Dental no notable dental hx. (+) Teeth Intact   Pulmonary neg pulmonary ROS,    Pulmonary exam normal breath sounds clear to auscultation       Cardiovascular hypertension, Pt. on medications and Pt. on home beta blockers +CHF  Normal cardiovascular exam Rhythm:Regular Rate:Normal     Neuro/Psych PSYCHIATRIC DISORDERS negative neurological ROS     GI/Hepatic GERD  Medicated and Controlled,  Endo/Other  diabetes, Poorly Controlled, Type 2, Oral Hypoglycemic Agents, Insulin DependentLeft Breast Ca  Renal/GU Renal InsufficiencyRenal disease  negative genitourinary   Musculoskeletal negative musculoskeletal ROS (+)   Abdominal (+) + obese,   Peds  Hematology   Anesthesia Other Findings   Reproductive/Obstetrics                             Anesthesia Physical  Anesthesia Plan  ASA: III  Anesthesia Plan: MAC   Post-op Pain Management:  Regional for Post-op pain   Induction:   PONV Risk Score and Plan: 2 and Ondansetron, Dexamethasone, Treatment may vary due to age or medical condition and Midazolam  Airway Management Planned: Mask, Natural Airway, Nasal Cannula and Simple Face Mask  Additional Equipment:   Intra-op Plan:   Post-operative Plan: Extubation in OR  Informed Consent: I have reviewed the patients History and Physical, chart, labs and discussed the procedure including the risks, benefits and alternatives for the proposed anesthesia with the patient or authorized representative who has indicated his/her understanding and acceptance.   Dental advisory given  Plan Discussed with:  Anesthesiologist, CRNA and Surgeon  Anesthesia Plan Comments:         Anesthesia Quick Evaluation

## 2017-05-30 NOTE — Discharge Instructions (Signed)
Ok to shower   No restrictions   Regional Anesthesia Blocks  1. Numbness or the inability to move the "blocked" extremity may last from 3-48 hours after placement. The length of time depends on the medication injected and your individual response to the medication. If the numbness is not going away after 48 hours, call your surgeon.  2. The extremity that is blocked will need to be protected until the numbness is gone and the  Strength has returned. Because you cannot feel it, you will need to take extra care to avoid injury. Because it may be weak, you may have difficulty moving it or using it. You may not know what position it is in without looking at it while the block is in effect.  3. For blocks in the legs and feet, returning to weight bearing and walking needs to be done carefully. You will need to wait until the numbness is entirely gone and the strength has returned. You should be able to move your leg and foot normally before you try and bear weight or walk. You will need someone to be with you when you first try to ensure you do not fall and possibly risk injury.  4. Bruising and tenderness at the needle site are common side effects and will resolve in a few days.  5. Persistent numbness or new problems with movement should be communicated to the surgeon or the [facility] [phone] [facility] [phone]).   Post Anesthesia Home Care Instructions  Activity: Get plenty of rest for the remainder of the day. A responsible individual must stay with you for 24 hours following the procedure.  For the next 24 hours, DO NOT: -Drive a car -Paediatric nurse -Drink alcoholic beverages -Take any medication unless instructed by your physician -Make any legal decisions or sign important papers.  Meals: Start with liquid foods such as gelatin or soup. Progress to regular foods as tolerated. Avoid greasy, spicy, heavy foods. If nausea and/or vomiting occur,  drink only clear liquids until the nausea and/or vomiting subsides. Call your physician if vomiting continues.  Special Instructions/Symptoms: Your throat may feel dry or sore from the anesthesia or the breathing tube placed in your throat during surgery. If this causes discomfort, gargle with warm salt water. The discomfort should disappear within 24 hours.  If you had a scopolamine patch placed behind your ear for the management of post- operative nausea and/or vomiting:  1. The medication in the patch is effective for 72 hours, after which it should be removed.  Wrap patch in a tissue and discard in the trash. Wash hands thoroughly with soap and water. 2. You may remove the patch earlier than 72 hours if you experience unpleasant side effects which may include dry mouth, dizziness or visual disturbances. 3. Avoid touching the patch. Wash your hands with soap and water after contact with the patch.

## 2017-05-30 NOTE — H&P (View-Only) (Signed)
Wendy [name] 05/21/2017 2:15 PM Location: [facility] Patient #: [id number] DOB: 07/15/1941 Married / Language: English / Race: Black or African American Female  History of Present Illness Wendy Moores A. Sia Gabrielsen MD; 05/21/2017 2:37 PM) Patient words: Patient returns for follow-up of her left breast cancer status post breast conserving surgery, chemotherapy and radiation therapy. She has finished radiation therapy earlier this month. She has a sore left breast she states and occasional drainage from her nipple. This appears clear. She denies fever or chills.  Her oncology note:            Malignant neoplasm of upper-outer quadrant of left breast, Invasive Ductal Carcinoma, pT2pN1M0, stage IIB, ER+/PR-/HER2-, G3, mammaprint high risk  -Left lumpectomy and SLN biopsy on 12/07/16 revealed grade 3 IDC measuring 3.4 cm, carcinoma was broadly present at the superior margin, grade 3 DCIS, and 1 out of 2 left axillary SLNs were positive for metastatic carcinoma. With carcinoma broadly present at the superior margin. Excision of the left superior margin on 12/28/16 showed no evidence of malignancy. -Mammaprint showed high risk disease, basal type.The average 10 year risk of recurrence without adjuvant therapy is 29%.  The patient is a 76 year old female.   Allergies Wendy [name], [place]; 05/21/2017 2:15 PM) Allergies Reconciled No Known Allergies 11/13/2016  Medication History Wendy [name], [place]; 05/21/2017 2:15 PM) AmLODIPine Besylate (10MG Tablet, Oral) Active. Basaglar KwikPen (100UNIT/ML Soln Pen-inj, Subcutaneous) Active. BD Insulin Syringe (30G X 1/2"0.5 ML Misc,) Active. BD Insulin Syr Ultrafine II (31G X 5/16"0.5 ML Misc,) Active. Carvedilol (25MG Tablet, Oral) Active. CloNIDine HCl (0.3MG Tablet, Oral) Active. HydroCHLOROthiazide (25MG Tablet, Oral) Active. Lantus (100UNIT/ML Solution, Subcutaneous) Active. Lisinopril (40MG Tablet, Oral) Active. MetFORMIN  HCl (500MG Tablet, Oral) Active. NovoLOG (100UNIT/ML Solution, Subcutaneous) Active. Rosuvastatin Calcium (5MG Tablet, Oral) Active. Medications Reconciled    Vitals Wendy [name] RMA; 05/21/2017 2:16 PM) 05/21/2017 2:15 PM Weight: 175.2 lb Height: 62in Body Surface Area: 1.81 m Body Mass Index: 32.04 kg/m  Temp.: 98.54F  Pulse: 72 (Regular)  BP: 136/80 (Sitting, Left Arm, Standard)      Physical Exam (Wendy Nevins A. Aasia Peavler MD; 05/21/2017 2:37 PM)  General Mental Status-Alert. General Appearance-Consistent with stated age. Hydration-Well hydrated. Voice-Normal.  Chest and Lung Exam Note: Port under right clavicle noted.  Breast Note: Left breast has postradiation change and volume loss. Incision is healed. Small persistent seroma noted. Radiation skin changes noted. Right breast is normal.  Lymphatic Head & Neck  General Head & Neck Lymphatics: Bilateral - Description - Normal. Axillary  General Axillary Region: Bilateral - Description - Normal. Tenderness - Non Tender.    Assessment & Plan (Wendy Rorke A. Lynleigh Kovack MD; 05/21/2017 2:38 PM)  HISTORY OF BREAST CANCER (Z85.3) Impression: Appears stable. She is now on antiestrogen. Continue follow-up with Dr. Burr Medico  Current Plans I recommended surgery to remove the catheter. I explained the technique of removal with use of local anesthesia & possible need for more aggressive sedation/anesthesia for patient comfort.  Risks such as bleeding, infection, and other risks were discussed. Post-operative dressing/incision care was discussed. I noted a good likelihood this will help address the problem. We will work to minimize complications. Questions were answered. The patient expresses understanding & wishes to proceed with surgery.  Pt Education - [facility] Free Text Education/Instructions: discussed with patient and provided information. PORT-A-CATH IN PLACE [phone]) Impression: Scheduled for port  removal.  Risk of bleeding, infection, catheter fragmentation, Embolization, need further surgeries and are partially discussed.

## 2017-05-30 NOTE — Anesthesia Postprocedure Evaluation (Signed)
Anesthesia Post Note  Patient: Wendy [name]  Procedure(s) Performed: Procedure(s) (LRB): REMOVAL PORT-A-CATH (N/A)     Patient location during evaluation: PACU Anesthesia Type: MAC Level of consciousness: awake and alert Pain management: pain level controlled Vital Signs Assessment: post-procedure vital signs reviewed and stable Respiratory status: spontaneous breathing, nonlabored ventilation, respiratory function stable and patient connected to nasal cannula oxygen Cardiovascular status: stable and blood pressure returned to baseline Anesthetic complications: no    Last Vitals:  Vitals:   05/30/17 1349 05/30/17 1400  BP: 127/66 (!) 141/65  Pulse:  69  Resp:  14  Temp:  (!) 36.4 C  SpO2:  100%    Last Pain:  Vitals:   05/30/17 1400  TempSrc:   PainSc: 0-No pain                 Taden Witter

## 2017-05-30 NOTE — Anesthesia Procedure Notes (Signed)
Procedure Name: MAC Date/Time: 05/30/2017 12:56 PM Performed by: Ronte Parker D Pre-anesthesia Checklist: Patient identified, Emergency Drugs available, Suction available, Patient being monitored and Timeout performed Patient Re-evaluated:Patient Re-evaluated prior to induction Oxygen Delivery Method: Simple face mask

## 2017-05-31 ENCOUNTER — Encounter (HOSPITAL_BASED_OUTPATIENT_CLINIC_OR_DEPARTMENT_OTHER): Payer: Self-pay | Admitting: Surgery

## 2017-05-31 ENCOUNTER — Ambulatory Visit: Payer: Medicare Other | Admitting: Hematology

## 2017-05-31 ENCOUNTER — Other Ambulatory Visit: Payer: Medicare Other

## 2017-06-08 NOTE — Progress Notes (Signed)
Wendy [name] is in a  [place]  [phone] [phone]  Clinic Follow Up Note   Patient Care Team: Binnie Rail, MD as PCP - General (Internal Medicine) Philemon Kingdom, MD as Consulting Physician (Internal Medicine) Jalene Mullet, MD as Consulting Physician (Ophthalmology) Erroll Luna, MD as Consulting Physician (General Surgery) Kyung Rudd, MD as Consulting Physician (Radiation Oncology) [name], MD as Consulting Physician (Hematology) 06/12/2017  CHIEF COMPLAINTS:  Follow Up for Invasive Ductal Carcinoma of the left breast  Oncology History   Cancer Staging Malignant neoplasm of upper-outer quadrant of left breast in female, estrogen receptor positive ([place]) Staging form: Breast, AJCC 8th Edition - Clinical: Stage IIB (cT2, cN0, cM0, G3, ER: Positive, PR: Negative, HER2: Negative) - Signed by [name], MD on 11/23/2016 - Pathologic stage from 12/28/2016: Stage IIB (pT2, pN1a(sn), cM0, G3, ER: Positive, PR: Negative, HER2: Negative) - Signed by [name], MD on 01/02/2017       Malignant neoplasm of upper-outer quadrant of left breast in female, estrogen receptor positive ([place])   11/01/2016 Mammogram    Diagnostic mammogram and ultrasound of left breast and axilla showed a 3.1 x 2.1 x 1.4 cm (3.3 x 2.0 x 2.7 cm by ultrasound) mass in the upper outer quadrant of the anterior third of the left breast, associated with pleomorphic calcification. There is a 8 mm (1.8cm by [name]) prominent lymph node in the left axilla.      11/07/2016 Initial Biopsy    Left breast 1:00 position biopsy showed invasive ductal carcinoma and DCIS, G3, left axillary node biopsy was negative.      11/07/2016 Receptors her2    ER 80% positive, PR negative, HER-2 negative, Ki-67 90%      11/20/2016 Initial Diagnosis    Malignant neoplasm of upper-outer quadrant of left breast in female, estrogen receptor positive ([place])     12/07/2016 Surgery    Left lumpectomy and left  axillary sentinel lymph node sampling by Dr. Brantley Stage      12/07/2016 Pathology Results    pT2, pN1 Left Lumpectomy: Grade 3 IDC measuring 3.4 cm, carcinoma broadly present at the superior margin. Grade 3 DCIS. 1 out of 2 left axillary SLN positive for metastatic carcinoma      12/07/2016 Miscellaneous    Mammaprint showed high risk disease, basal type       12/28/2016 Surgery    Re-excision of the previously positive superior margin was negative for malignant cells.       01/04/2017 Surgery    Port inserted      01/18/2017 - 03/22/2017 Chemotherapy    Adjuvant Docetaxel 75 mg/m and Cytoxan 600 mg/m, every 21 days, for total of 4 cycles, with Neulasta on day 2.       04/23/2017 - 05/18/2017 Radiation Therapy    Radiation treatment dates:   04/23/17 - 05/18/17 Administered by Dr. Lisbeth Renshaw  Site/dose:    Left breast/ 42.5 Gy in 17 Fx Boost / 7.5 Gy in 3 Fx      06/12/2017 -  Anti-estrogen oral therapy    Adjuvant letrozole 1 mg daily, plan for 5-7 years        HISTORY OF PRESENTING ILLNESS:  Wendy [name] 76 y.o. female is here because of invasive ductal carcinoma of the left breast. She is accompanied by her son to my clinic today. She was referred by her breast surgeon Dr. Brantley Stage.   About 2 months ago, the patient noted a mass on her  left breast. She underwent a mammogram and breast US on 11/01/2016, which showed a 3.1 cm mass in the upper outer quadrant of the left breast with pleomorphic calcifications. Imaging also noted abnormal appearing lymph nodes in the left axilla. A core biopsy was done on 11/07/2016, which revealed invasive ductal carcinoma ER positive PR negative with a Ki-67 of 90%. On November 13, 2016 she met with Dr. Brantley Stage to discuss a lumpectomy. She presents for further treatment.   She presents with her son today. She felt the lump herself about 2 months ago. She noticed last month that it had gotten a little bigger. So she went to her PCP, who ordered a mammogram. She  does not get mammograms every year. Her last mammogram was May 2017, which showed no cancer. She had some pain and itching in her left nipple before her diagnosis. She has some lower back pain if she is laying down too long, but that has been present for a while. Denies any new bone or joint pain, weight loss, breast swelling, nipple discharge, or any other concerns. She is very active and takes care of herself and her husband.   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 They have a nurse for the rest of the week. [name] has dementia as well as numerous other health problems. Her son has moved in with her to help with her husband. She has a history of HTN and DM. Her DM is not well controlled; she takes medication for this. Her BP is also not well controlled on lisinopril. She has never had a heart attack or stroke. No surgeries in the past other than the removal of her benign lesion. No family history of breast cancer. Her father did have a brain tumor at the age of 69, which [name] passed from. Never smoker. She does not drink alcohol. She has 9 grandchildren.   GYN HISTORY  Menarchal: 76 y.o LMP: around 76 y.o Contraceptive: n/a HRT: No GP: 4 pregnancies, 4 children (3 boys, 1 girl). 1st child at age 48  CURRENT THERAPY: pending letrozole, starting this week    INTVERVAL HISTORY: REGNIA MATHWIG returns for a follow up post radiation. She presents to the clinic accompanied by her son. She completes adjuvant breast radiation on 05/18/2017, tolerated well overall. She did have mild fatigue, recovered well.   She was found to be slightly hypotensive today, and complains of intermittent dizziness and fatigue. She denies any sweating, fever, chills, or other new symptoms.    MEDICAL HISTORY:  Past Medical History:  Diagnosis Date  . Breast cancer ([place]) 11/2016   left  . GERD (gastroesophageal reflux disease)    TUMS as needed  . HTN (hypertension)    states BP has been high  recently; has been on med. x 20 yr.  . Hyperlipidemia   . Insulin dependent diabetes mellitus ([place])     SURGICAL HISTORY: Past Surgical History:  Procedure Laterality Date  . BREAST LUMPECTOMY Left 05/05/2008  . BREAST LUMPECTOMY WITH RADIOACTIVE SEED AND SENTINEL LYMPH NODE BIOPSY Left 12/07/2016   Procedure: LEFT BREAST LUMPECTOMY WITH RADIOACTIVE SEED AND SENTINEL LYMPH NODE BIOPSY;  Surgeon: Erroll Luna, MD;  Location: [facility];  Service: General;  Laterality: Left;  . IR FLUORO GUIDE PORT INSERTION RIGHT  01/04/2017  . IR US GUIDE VASC ACCESS RIGHT  01/04/2017  . PORT-A-CATH REMOVAL N/A 05/30/2017   Procedure: REMOVAL PORT-A-CATH;  Surgeon: Erroll Luna, MD;  Location: [facility];  Service: General;  Laterality: N/A;  . RE-EXCISION OF BREAST LUMPECTOMY Left 12/28/2016   Procedure: RE-EXCISION OF BREAST LUMPECTOMY;  Surgeon: Erroll Luna, MD;  Location: [facility];  Service: General;  Laterality: Left;    SOCIAL HISTORY: Social History   Social History  . Marital status: Married    Spouse name: N/A  . Number of children: 4  . Years of education: N/A   Occupational History  . [employer]   Social History Main Topics  . Smoking status: Never Smoker  . Smokeless tobacco: Current User    Types: Snuff  . Alcohol use No  . Drug use: No  . Sexual activity: Not Currently   Other Topics Concern  . Not on file   Social History Narrative  . No narrative on file    FAMILY HISTORY: Family History  Problem Relation Age of Onset  . Diabetes Sister        x 4  . Diabetes Brother   . Diabetes Paternal Grandmother   . Diabetes Paternal Aunt   . Heart disease Brother   . Heart disease Sister   . Emphysema Brother   . Cancer Father 59       brain tumor     ALLERGIES:  has No Known Allergies.  MEDICATIONS:  Current Outpatient Prescriptions  Medication Sig Dispense Refill  . amLODipine (NORVASC) 10 MG tablet TAKE 1 TABLET  (10 MG TOTAL) BY MOUTH DAILY. 30 tablet 5  . aspirin 81 MG tablet Take 81 mg by mouth daily.      . B-D INS SYRINGE 0.5CC/31GX5/16 31G X 5/16" 0.5 ML MISC USE AS DIRECTED 4 TIMES A DAY 300 each 1  . carvedilol (COREG) 25 MG tablet TAKE 1 TABLET BY MOUTH TWICE A DAY WITH A MEAL 180 tablet 1  . cloNIDine (CATAPRES) 0.3 MG tablet Take 0.3 mg by mouth 2 (two) times daily.    . hydrochlorothiazide (HYDRODIURIL) 25 MG tablet TAKE 1 TABLET (25 MG TOTAL) BY MOUTH DAILY. 30 tablet 0  . insulin aspart (NOVOLOG) 100 UNIT/ML injection Inject 5-6 Units into the skin 2 (two) times daily with a meal. 10 mL 1  . Insulin Glargine (LANTUS SOLOSTAR) 100 UNIT/ML Solostar Pen Inject 24 Units into the skin every morning. 5 pen 0  . Insulin Pen Needle 31G X 5 MM MISC Use pen needles for insulin injection daily 100 each 3  . Insulin Syringe-Needle U-100 (B-D INS SYRINGE 0.5CC/30GX1/2") 30G X 1/2" 0.5 ML MISC USE AS DIRECTED 4 TIMES A DAY 300 each 3  . lidocaine-prilocaine (EMLA) cream Apply to affected area once 30 g 3  . lisinopril (PRINIVIL,ZESTRIL) 40 MG tablet TAKE 1 TABLET (40 MG TOTAL) BY MOUTH DAILY. 30 tablet 5  . metFORMIN (GLUCOPHAGE) 500 MG tablet Take 500 mg by mouth 2 (two) times daily with a meal.    . non-metallic deodorant (ALRA) MISC Apply 1 application topically daily.    Marland Kitchen OVER THE COUNTER MEDICATION Take 1 tablet by mouth daily as needed. Stool softner for constipation, also drinks warm prune juice prn    . rosuvastatin (CRESTOR) 5 MG tablet Take 1 tablet (5 mg total) by mouth daily. -- need labs for further refills 90 tablet 0  . letrozole (FEMARA) 2.5 MG tablet Take 1 tablet (2.5 mg total) by mouth daily. 30 tablet 2   No current facility-administered medications for this visit.     REVIEW OF SYSTEMS:  Constitutional: Denies fevers, chills or abnormal night sweats (+) fatigue Eyes: Denies blurriness of vision, double vision or watery eyes Ears, nose, mouth, throat, and face: Denies mucositis  or sore throat Respiratory: Denies cough, dyspnea or wheezes Cardiovascular: Denies palpitation, chest discomfort or lower extremity swelling, Gastrointestinal:  Denies nausea, heartburn  Skin: Denies abnormal skin rashes (+)darkened fingernails from chemo Lymphatics: Denies new lymphadenopathy or easy bruising Neurological:Denies numbness, tingling or new weaknesses Behavioral/Psych: Mood is stable, no new changes  Musculoskeletal: (+) lower back pain Breast: (+) Left breast skin pigmentation All other systems were reviewed with the patient and are negative.  PHYSICAL EXAMINATION:  ECOG PERFORMANCE STATUS: 1  Vitals:   06/12/17 1021 06/12/17 1022  BP: 90/63 (!) 91/44  Pulse: 80   Resp: 16   Temp: 98.2 F (36.8 C)   SpO2: 100%    Filed Weights   06/12/17 1021  Weight: 173 lb 14.4 oz (78.9 kg)    GENERAL: Little drowsy, easily arousable, oriented and answers questions appropriately, no distress and comfortable SKIN: skin color, texture, turgor are normal, no rashes or significant lesions  EYES: normal, conjunctiva are pink and non-injected, sclera clear OROPHARYNX:no exudate, no erythema and lips, buccal mucosa, and tongue normal  NECK: supple, thyroid normal size, non-tender, without nodularity LYMPH:  no palpable lymphadenopathy in the cervical, axillary or inguinal LUNGS: clear to auscultation and percussion with normal breathing effort HEART: regular rate & rhythm and no murmurs and no lower extremity edema ABDOMEN:abdomen soft, non-tender and normal bowel sounds Musculoskeletal:no cyanosis of digits and no clubbing  PSYCH: alert & oriented x 3 with fluent speech NEURO: no focal motor/sensory deficits Breasts: Breast inspection showed them to be symmetrical with no nipple discharge. Surgical scar in the left breast healed well, no discharge or skin erythema, no palpable mass or adenopathy. Mild skin hyperpigmentation of the left breast secondary to radiation.  LABORATORY  DATA:  I have reviewed the data as listed CBC Latest Ref Rng & Units 06/12/2017 04/19/2017 03/22/2017  WBC 3.9 - 10.3 10e3/uL 4.7 4.2 5.9  Hemoglobin 11.6 - 15.9 g/dL 11.4(L) 9.2(L) 9.2(L)  Hematocrit 34.8 - 46.6 % 34.0(L) 26.7(L) 26.7(L)  Platelets 145 - 400 10e3/uL 218 256 301   CMP Latest Ref Rng & Units 06/12/2017 04/19/2017 03/22/2017  Glucose 70 - 140 mg/dl 184(H) 133 413(H)  BUN 7.0 - 26.0 mg/dL 14.9 27.8(H) 30.1(H)  Creatinine 0.6 - 1.1 mg/dL 1.1 1.1 1.3(H)  Sodium 136 - 145 mEq/L 141 138 134(L)  Potassium 3.5 - 5.1 mEq/L 4.0 4.2 4.0  Chloride 96 - 112 mEq/L - - -  CO2 22 - 29 mEq/L 26 26 25   Calcium 8.4 - 10.4 mg/dL 9.6 9.4 9.6  Total Protein 6.4 - 8.3 g/dL 7.2 6.7 6.8  Total Bilirubin 0.20 - 1.20 mg/dL 0.29 0.39 0.45  Alkaline Phos 40 - 150 U/L 75 71 80  AST 5 - 34 U/L 8 9 10   ALT 0-55 U/L U/L <6 <6 9   PATHOLOGY: Diagnosis 12/28/16 Breast, excision, left, superior margin - BENIGN FIBROADIPOSE TISSUE WITH HEALING BIOPSY SITE. - BENIGN SKELETAL MUSCLE. - THERE IS NO EVIDENCE OF MALIGNANCY. - SEE COMMENT. Microscopic Comment The surgical resection margin(s) of the specimen were inked and microscopically evaluated. (JBK:ah 01/01/17) Enid Cutter MD Pathologist, Electronic Signature (Case signed 01/01/2017)  Mammaprint 12/07/16   Diagnosis 12/07/2016 1. Breast, lumpectomy, Left w/seed INVASIVE DUCTAL CARCINOMA, GRADE 3, SPANNING 3.4 CM (PT2) DUCTAL CARCINOMA IN SITU IS PRESENT, GRADE 3 THE CARCINOMA IS BROADLY  PRESENTED AT THE SUPERIOR MARGIN 2. Lymph node, sentinel, biopsy, Left Axillary ONE BENIGN LYMPH NODE (0/1) 3. Lymph node, sentinel, biopsy, Left METASTATIC CARCINOMA IN ONE OF ONE LYMPH NODE (1/1) 4. Breast, excision, Left additional Medial Margin BENIGN BREAST TISSUE Microscopic Comment 1. BREAST, INVASIVE TUMOR Procedure: Lumpectomy Laterality: Left Tumor Size: 3.4 cm Histologic Type: Ductal carcinoma Grade: Tubular Differentiation: 3 Nuclear Pleomorphism:  2 Mitotic Count:3 Ductal Carcinoma in Situ (DCIS): present Extent of Tumor: Skin: negative Nipple: negative Skeletal muscle: negative Margins: Invasive carcinoma, distance from closest margin: invasive carcinoma presented at the superior cauterized margin DCIS, distance from closest margin: 0.1 cm from the superior margin Regional Lymph Nodes: Number of Lymph Nodes Examined: 1 of 3 FINAL for KIMI, BORDEAU ([id number]) Microscopic Comment(continued) Number of Sentinel Lymph Nodes Examined: 2 Lymph Nodes with Macrometastases: 1 Lymph Nodes with Micrometastases: 0 Lymph Nodes with Isolated Tumor Cells: 0 Breast Prognostic Profile: Estrogen Receptor: 80% Progesterone Receptor: 0% Her2: Negative Ki-67: 90% Pathologic Stage Classification (pTNM, AJCC 8th Edition): Primary Tumor (pT): pT2 Regional Lymph Nodes (pN): pN1 Distant Metastases (pM): pMx Casimer Lanius MD Pathologist, Electronic Signature (Case signed 12/10/2016)  Diagnosis 11/07/2016 1. Breast, left, needle core biopsy, 1:00 o'clock upper outer quadrant - INVASIVE DUCTAL CARCINOMA. - DUCTAL CARCINOMA IN SITU - SEE COMMENT. 2. Lymph node, needle/core biopsy, inferior left axilla - ONE PARTIALLY SAMPLED BENIGN LYMPH NODE WITH NO TUMOR SEEN. Microscopic Comment 1. Although definitive grading of breast carcinoma is best done on excision, the features of the invasive tumor from the left 1 o'clock upper outer quadrant breast biopsy are compatible with a grade 3 breast carcinoma. Breast prognostic markers Wendy be performed and reported in an addendum. Findings are called to [facility] on 11/08/2016. Dr. Lyndon Code has seen the left 1:00 breast biopsy in consultation with agreement. 2. The findings from the left inferior axillary lymph node are called to the [facility] on 11/08/2016. Dr. Lyndon Code has seen the lymph node biopsy in consultation with agreement. (RH:kh 11/08/16) 1. FLUORESCENCE IN-SITU  HYBRIDIZATION Results: HER2 - NEGATIVE RATIO OF HER2/CEP17 SIGNALS 1.71 AVERAGE HER2 COPY NUMBER PER CELL 2.90 Reference Range: NEGATIVE HER2/CEP17 Ratio <2.0 and average HER2 copy number <4.0 EQUIVOCAL HER2/CEP17 Ratio <2.0 and average HER2 copy number >=4.0 and <6.0 POSITIVE HER2/CEP17 Ratio >=2.0 or <2.0 and average HER2 copy number >=6.0 1. PROGNOSTIC INDICATORS Results: IMMUNOHISTOCHEMICAL AND MORPHOMETRIC ANALYSIS PERFORMED MANUALLY Estrogen Receptor: 80%, POSITIVE, WEAK STAINING INTENSITY Progesterone Receptor: 0%, NEGATIVE Proliferation Marker Ki67: 90% COMMENT: The negative hormone receptor study(ies) in this case has An internal positive control. REFERENCE RANGE ESTROGEN RECEPTOR NEGATIVE 0% POSITIVE =>1% REFERENCE RANGE PROGESTERONE RECEPTOR NEGATIVE 0% POSITIVE =>1%  RADIOGRAPHIC STUDIES: I have personally reviewed the radiological images as listed and agreed with the findings in the report. No results found.   Mammogram: 11/01/16 Diagnostic mammogram and ultrasound of left breast and axilla showed a 3.1 x 2.1 x 1.4 cm (3.3 x 2.0 x 2.7 cm by ultrasound) mass in the upper outer quadrant of the anterior third of the left breast, associated with pleomorphic calcification. There is a 8 mm (1.8cm by [name]) prominent lymph node in the left axilla.   ASSESSMENT & PLAN:  Mrs. Keener is a 76 y.o. female who presented with a self palpable left breast mass  1. Malignant neoplasm of upper-outer quadrant of left breast, Invasive Ductal Carcinoma, pT2pN1M0, stage IIB, ER+/PR-/HER2-, G3, mammaprint high risk  --I discussed her surgical path result in details, she had reexcision  for positive margins, her final surgical margins were negative. -Left lumpectomy and SLN biopsy on 12/07/16 revealed grade 3 IDC measuring 3.4 cm, carcinoma was broadly present at the superior margin, grade 3 DCIS, and 1 out of 2 left axillary SLNs were positive for metastatic carcinoma. With carcinoma broadly  present at the superior margin. Excision of the left superior margin on 12/28/16 showed no evidence of malignancy. -Mammaprint showed high risk disease, basal type.The average 10 year risk of recurrence without adjuvant therapy is 29%. -I previously recommend her to consider adjuvant chemotherapy to reduce her risk of recurrence after surgery. -she received adjuvant chemotherapy with 4 cycles of Docetaxel and Cytoxan, tolerated well -She has completed adjuvant chemotherapy, tolerating well overall. -She started RT 04/23/17 and completed 05/18/17 -Port was removed 05/30/17 by Dr. Brantley Stage --Given the strong ER and PR positivity, I do recommend adjuvant aromatase inhibitor to reduce her risk of cancer recurrence,  The potential benefit and side effects, which includes but not limited to, hot flash, skin and vaginal dryness, metabolic changes ( increased blood glucose, cholesterol, weight, etc.), slightly in increased risk of cardiovascular disease, cataracts, muscular and joint discomfort, osteopenia and osteoporosis, etc, were discussed with her in great details. She is interested, and Wendy start this week. -I cautery and that she is on 1 mg daily to her pharmacy today -We reviewed her breast cancer surveillance, I encouraged her to continue annual diagnostic mammogram, self exam, healthy diet, exercise regular, and a follow-up US regularly.  2. HTN and hypotension  -She was noticed to be hypertensive today, and complains of dizziness, she has been on 5 different blood pressure medications, I instructed her to hold on hydrochlorothiazide and , and repeat blood pressure at home.  -I Wendy give 1L NS today  -This is likely medication induced hypotension, no concerns of sepsis  -I'll copy my noticed her primary care physician Dr. Quay Burow. I encouraged her to follow-up with Dr. Quay Burow.    3. DM -she is on insulin and metformin -Lab reviewed, her blood glucose is 184 today -I encouraged her to check her blood  glucose in a follow-up with her endocrinologist  4. CKD stage III -She has slightly elevated creatinine at baseline, EGFR 46, probably secondary to her diabetes and hypertension -We'll try to avoid dehydration, and the nephrotoxic medications. -Her cr has been stable    Plan:  -She Wendy start letrozole this week, prescription called into her pharmacy today -Due to her hypertension, NORMAL saline 1 L today -I'll hold on hydrochlorothiazide, and do not restart her blood pressure medication until her blood pressure normalized at home -I'll let my nurse to call Dr. Quay Burow office regarding her blood pressure medications -She Wendy see survivorship clinic in 2 months, I'll see her back was lab in 4 months  All questions were answered. The patient knows to call the clinic with any problems, questions or concerns.  I spent 30 minutes counseling the patient face to face. The total time spent in the appointment was 40 minutes and more than 50% was on counseling.   [name], MD 06/12/2017   This document serves as a record of services personally performed by [name], MD. It was created on her behalf by Joslyn Devon, a trained medical scribe. The creation of this record is based on the scribe's personal observations and the provider's statements to them. This document has been checked and approved by the attending provider.

## 2017-06-12 ENCOUNTER — Telehealth: Payer: Self-pay | Admitting: Internal Medicine

## 2017-06-12 ENCOUNTER — Encounter: Payer: Self-pay | Admitting: Hematology

## 2017-06-12 ENCOUNTER — Ambulatory Visit (HOSPITAL_BASED_OUTPATIENT_CLINIC_OR_DEPARTMENT_OTHER): Payer: Medicare Other | Admitting: Hematology

## 2017-06-12 ENCOUNTER — Other Ambulatory Visit (HOSPITAL_BASED_OUTPATIENT_CLINIC_OR_DEPARTMENT_OTHER): Payer: Medicare Other

## 2017-06-12 ENCOUNTER — Telehealth: Payer: Self-pay | Admitting: *Deleted

## 2017-06-12 VITALS — BP 129/48 | HR 76 | Temp 98.2°F | Resp 16 | Wt 173.9 lb

## 2017-06-12 DIAGNOSIS — Z170 Estrogen receptor positive status [ER+]: Secondary | ICD-10-CM

## 2017-06-12 DIAGNOSIS — E119 Type 2 diabetes mellitus without complications: Secondary | ICD-10-CM | POA: Diagnosis not present

## 2017-06-12 DIAGNOSIS — C50412 Malignant neoplasm of upper-outer quadrant of left female breast: Secondary | ICD-10-CM | POA: Diagnosis not present

## 2017-06-12 DIAGNOSIS — M8580 Other specified disorders of bone density and structure, unspecified site: Secondary | ICD-10-CM

## 2017-06-12 DIAGNOSIS — R42 Dizziness and giddiness: Secondary | ICD-10-CM | POA: Diagnosis not present

## 2017-06-12 DIAGNOSIS — I10 Essential (primary) hypertension: Secondary | ICD-10-CM

## 2017-06-12 DIAGNOSIS — N183 Chronic kidney disease, stage 3 unspecified: Secondary | ICD-10-CM

## 2017-06-12 DIAGNOSIS — I952 Hypotension due to drugs: Secondary | ICD-10-CM

## 2017-06-12 DIAGNOSIS — I959 Hypotension, unspecified: Secondary | ICD-10-CM | POA: Insufficient documentation

## 2017-06-12 LAB — COMPREHENSIVE METABOLIC PANEL
AST: 8 U/L (ref 5–34)
Albumin: 3.5 g/dL (ref 3.5–5.0)
Alkaline Phosphatase: 75 U/L (ref 40–150)
Anion Gap: 9 mEq/L (ref 3–11)
BUN: 14.9 mg/dL (ref 7.0–26.0)
CALCIUM: 9.6 mg/dL (ref 8.4–10.4)
CHLORIDE: 105 meq/L (ref 98–109)
CO2: 26 meq/L (ref 22–29)
CREATININE: 1.1 mg/dL (ref 0.6–1.1)
EGFR: 60 mL/min/{1.73_m2} — ABNORMAL LOW (ref >90)
GLUCOSE: 184 mg/dL — AB (ref 70–140)
POTASSIUM: 4 meq/L (ref 3.5–5.1)
SODIUM: 141 meq/L (ref 136–145)
Total Bilirubin: 0.29 mg/dL (ref 0.20–1.20)
Total Protein: 7.2 g/dL (ref 6.4–8.3)

## 2017-06-12 LAB — CBC WITH DIFFERENTIAL/PLATELET
BASO%: 0 % (ref 0.0–2.0)
Basophils Absolute: 0 10*3/uL (ref 0.0–0.1)
EOS ABS: 0.1 10*3/uL (ref 0.0–0.5)
EOS%: 3 % (ref 0.0–7.0)
HEMATOCRIT: 34 % — AB (ref 34.8–46.6)
HGB: 11.4 g/dL — ABNORMAL LOW (ref 11.6–15.9)
LYMPH#: 0.7 10*3/uL — AB (ref 0.9–3.3)
LYMPH%: 14.4 % (ref 14.0–49.7)
MCH: 27.8 pg (ref 25.1–34.0)
MCHC: 33.5 g/dL (ref 31.5–36.0)
MCV: 82.9 fL (ref 79.5–101.0)
MONO#: 0.2 10*3/uL (ref 0.1–0.9)
MONO%: 4.7 % (ref 0.0–14.0)
NEUT#: 3.7 10*3/uL (ref 1.5–6.5)
NEUT%: 77.9 % — AB (ref 38.4–76.8)
Platelets: 218 10*3/uL (ref 145–400)
RBC: 4.1 10*6/uL (ref 3.70–5.45)
RDW: 15.4 % — ABNORMAL HIGH (ref 11.2–14.5)
WBC: 4.7 10*3/uL (ref 3.9–10.3)

## 2017-06-12 MED ORDER — LETROZOLE 2.5 MG PO TABS: 2.5000 mg | ORAL_TABLET | Freq: Every day | ORAL | 2 refills | Status: DC

## 2017-06-12 MED ORDER — SODIUM CHLORIDE 0.9 % IV SOLN
Freq: Once | INTRAVENOUS | Status: AC
  Administered 2017-06-12: 12:00:00 via INTRAVENOUS

## 2017-06-12 NOTE — Telephone Encounter (Addendum)
Patient was at cancer center today and was dizzy all day, calling to report BP's  Patients BP's today  10:22am 91/44 11:08am 73/42 11:09am 59/32 1:33pm 129/48  Gave 1 liter of saline-reacted well to it   Please advise and call back

## 2017-06-12 NOTE — Telephone Encounter (Signed)
Imformed Dr. Eilleen Kempf nurse Aldona [facility] of [facility]'s visit with [name] today.  She was very hypertensive and was given 1L of fluids. Copies of notes were sent to them.

## 2017-06-12 NOTE — Patient Instructions (Signed)
Dehydration, Adult Dehydration is when there is not enough fluid or water in your body. This happens when you lose more fluids than you take in. Dehydration can range from mild to very bad. It should be treated right away to keep it from getting very bad. Symptoms of mild dehydration may include:  Thirst.  Dry lips.  Slightly dry mouth.  Dry, warm skin.  Dizziness. Symptoms of moderate dehydration may include:  Very dry mouth.  Muscle cramps.  Dark pee (urine). Pee may be the color of tea.  Your body making less pee.  Your eyes making fewer tears.  Heartbeat that is uneven or faster than normal (palpitations).  Headache.  Light-headedness, especially when you stand up from sitting.  Fainting (syncope). Symptoms of very bad dehydration may include:  Changes in skin, such as: ? Cold and clammy skin. ? Blotchy (mottled) or pale skin. ? Skin that does not quickly return to normal after being lightly pinched and let go (poor skin turgor).  Changes in body fluids, such as: ? Feeling very thirsty. ? Your eyes making fewer tears. ? Not sweating when body temperature is high, such as in hot weather. ? Your body making very little pee.  Changes in vital signs, such as: ? Weak pulse. ? Pulse that is more than 100 beats a minute when you are sitting still. ? Fast breathing. ? Low blood pressure.  Other changes, such as: ? Sunken eyes. ? Cold hands and feet. ? Confusion. ? Lack of energy (lethargy). ? Trouble waking up from sleep. ? Short-term weight loss. ? Unconsciousness. Follow these instructions at home:  If told by your doctor, drink an ORS: ? Make an ORS by using instructions on the package. ? Start by drinking small amounts, about  cup (120 mL) every 5-10 minutes. ? Slowly drink more until you have had the amount that your doctor said to have.  Drink enough clear fluid to keep your pee clear or pale yellow. If you were told to drink an ORS, finish the ORS  first, then start slowly drinking clear fluids. Drink fluids such as: ? Water. Do not drink only water by itself. Doing that can make the salt (sodium) level in your body get too low (hyponatremia). ? Ice chips. ? Fruit juice that you have added water to (diluted). ? Low-calorie sports drinks.  Avoid: ? Alcohol. ? Drinks that have a lot of sugar. These include high-calorie sports drinks, fruit juice that does not have water added, and soda. ? Caffeine. ? Foods that are greasy or have a lot of fat or sugar.  Take over-the-counter and prescription medicines only as told by your doctor.  Do not take salt tablets. Doing that can make the salt level in your body get too high (hypernatremia).  Eat foods that have minerals (electrolytes). Examples include bananas, oranges, potatoes, tomatoes, and spinach.  Keep all follow-up visits as told by your doctor. This is important. Contact a doctor if:  You have belly (abdominal) pain that: ? Gets worse. ? Stays in one area (localizes).  You have a rash.  You have a stiff neck.  You get angry or annoyed more easily than normal (irritability).  You are more sleepy than normal.  You have a harder time waking up than normal.  You feel: ? Weak. ? Dizzy. ? Very thirsty.  You have peed (urinated) only a small amount of very dark pee during 6-8 hours. Get help right away if:  You have symptoms of   very bad dehydration.  You cannot drink fluids without throwing up (vomiting).  Your symptoms get worse with treatment.  You have a fever.  You have a very bad headache.  You are throwing up or having watery poop (diarrhea) and it: ? Gets worse. ? Does not go away.  You have blood or something green (bile) in your throw-up.  You have blood in your poop (stool). This may cause poop to look black and tarry.  You have not peed in 6-8 hours.  You pass out (faint).  Your heart rate when you are sitting still is more than 100 beats a  minute.  You have trouble breathing. This information is not intended to replace advice given to you by your health care provider. Make sure you discuss any questions you have with your health care provider. Document Released: 07/15/2009 Document Revised: 04/07/2016 Document Reviewed: 11/12/2015 Elsevier Interactive Patient Education  2018 [employer]  

## 2017-06-12 NOTE — Telephone Encounter (Signed)
Spoke with pt and daughter. Follow-up has been scheduled.

## 2017-06-12 NOTE — Telephone Encounter (Signed)
They advised her to hold hydrochlorothiazide and I agree - hold that for now.  Hold lisinopril if bp < 130/70. Monitor BP closely - call tomorrow with BP measures.  Continue with increased fluids.     Schedule f/u with me in the next week.

## 2017-06-13 ENCOUNTER — Telehealth: Payer: Self-pay | Admitting: Hematology

## 2017-06-13 LAB — CANCER ANTIGEN 27.29: CAN 27.29: 17.1 U/mL (ref 0.0–38.6)

## 2017-06-13 NOTE — Telephone Encounter (Signed)
Called patient regarding January 2019 °

## 2017-06-14 NOTE — Telephone Encounter (Signed)
Restart the hydrochlorothiazide and if BP drops  Too much advise her to decrease it to 1/2 pill daily

## 2017-06-14 NOTE — Telephone Encounter (Signed)
BP readings: 06/12/17 - 10:00pm 110/62  06/13/17 -  8:00am 187/105 9:30am 167/105 1:58pm 170/101  06/14/17 - 8:00am 175/95 3:30pm 174/86

## 2017-06-15 NOTE — Telephone Encounter (Signed)
Tried contacting pt, unable to LVM

## 2017-06-15 NOTE — Telephone Encounter (Signed)
Informed patient of response, she understood and will back if BP drops again and will monitor at home and will call back if she has any questions.

## 2017-06-18 ENCOUNTER — Other Ambulatory Visit: Payer: Self-pay | Admitting: Internal Medicine

## 2017-06-18 NOTE — Telephone Encounter (Signed)
Noted  

## 2017-06-20 ENCOUNTER — Ambulatory Visit (INDEPENDENT_AMBULATORY_CARE_PROVIDER_SITE_OTHER): Payer: Medicare Other | Admitting: Internal Medicine

## 2017-06-20 ENCOUNTER — Encounter: Payer: Self-pay | Admitting: Internal Medicine

## 2017-06-20 VITALS — BP 132/64 | HR 79 | Temp 97.9°F | Resp 16 | Wt 172.0 lb

## 2017-06-20 DIAGNOSIS — I952 Hypotension due to drugs: Secondary | ICD-10-CM | POA: Diagnosis not present

## 2017-06-20 DIAGNOSIS — I10 Essential (primary) hypertension: Secondary | ICD-10-CM | POA: Diagnosis not present

## 2017-06-20 NOTE — Patient Instructions (Addendum)
  No immunizations administered today.   Medications reviewed and updated.  No changes recommended at this time.    Please followup in 6 months   

## 2017-06-20 NOTE — Assessment & Plan Note (Signed)
bp well controlled now Continue to hold lisinopril Continue all other medications She will monitor her BP regularly at home and if too high or too low we will need to adjust medications Avoid dehydration - she does drink water regularly

## 2017-06-20 NOTE — Progress Notes (Signed)
Subjective:    Patient ID: Wendy [name], female    DOB: Jun 15, 1941, 76 y.o.   MRN: [id number]  HPI She is here for follow up of her blood pressure.  Hypertension: [facility] was feeling dizzy. Her blood pressures that day were 91/44, 73/42, 59/32 and 129/48. She was thought to be dehydrated and given a liter of saline. She was advised to hold her hydrochlorothiazide, which she did.  I also advised to stop the lisinopril. The following day her blood pressure was 187/105, 167/105, 170/101 in the next day was 175/95 174/86. I advised her to restart her hydrochlorothiazide and to monitor her blood pressure closely and if it did become too low to decrease the hydrochlorothiazide dose to one half of a pill daily. She is here for follow-up today.  She did stop the lisinopril and did not restart it. She is taking the whole hctz and her other medications.   She denies not drinking her usual amount of fluids or not eating when her BP was very low.    She is taking all her medication for her blood pressure daily as prescribed. Her BP at home has been "good".  She did not bring her log of numbers with her.  She denies chest pain, palpitations, sob and headaches.  She has some leg edema which is chronic.  She is compliant with a low sodium diet.  She is not currently exercising.    Medications and allergies reviewed with patient and updated if appropriate.  Patient Active Problem List   Diagnosis Date Noted  . Hypotension 06/12/2017  . Fatigue 03/14/2017  . Edema 03/14/2017  . Dyspnea on exertion 03/14/2017  . Port catheter in place 01/18/2017  . Rectal bleeding 01/17/2017  . Constipation 01/17/2017  . Malignant neoplasm of upper-outer quadrant of left breast in female, estrogen receptor positive ([place]) 11/20/2016  . Left upper arm pain 11/03/2016  . CKD (chronic kidney disease) stage 3, GFR 30-59 ml/min 03/17/2016  . Osteopenia 01/05/2016  . 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 Seasonal allergies 12/01/2013  . Chronic diastolic heart failure ([place]) 07/06/2011  . TOBACCO USE 04/13/2008  . Hyperlipidemia 11/29/2006  . OBESITY, BMI 30-35 11/29/2006  . HYPERTENSION, BENIGN SYSTEMIC 11/29/2006    Current Outpatient Prescriptions on File Prior to Visit  Medication Sig Dispense Refill  . amLODipine (NORVASC) 10 MG tablet TAKE 1 TABLET (10 MG TOTAL) BY MOUTH DAILY. 30 tablet 5  . aspirin 81 MG tablet Take 81 mg by mouth daily.      . B-D INS SYRINGE 0.5CC/31GX5/16 31G X 5/16" 0.5 ML MISC USE AS DIRECTED 4 TIMES A DAY 300 each 1  . carvedilol (COREG) 25 MG tablet TAKE 1 TABLET BY MOUTH TWICE A DAY WITH A MEAL 180 tablet 1  . cloNIDine (CATAPRES) 0.3 MG tablet Take 0.3 mg by mouth 2 (two) times daily.    . hydrochlorothiazide (HYDRODIURIL) 25 MG tablet TAKE 1 TABLET (25 MG TOTAL) BY MOUTH DAILY. 30 tablet 0  . insulin aspart (NOVOLOG) 100 UNIT/ML injection Inject 5-6 Units into the skin 2 (two) times daily with a meal. 10 mL 1  . Insulin Glargine (LANTUS SOLOSTAR) 100 UNIT/ML Solostar Pen Inject 24 Units into the skin every morning. 5 pen 0  . Insulin Pen Needle 31G X 5 MM MISC Use pen needles for insulin injection daily  100 each 3  . Insulin Syringe-Needle U-100 (B-D INS SYRINGE 0.5CC/30GX1/2") 30G X 1/2" 0.5 ML MISC USE AS DIRECTED 4 TIMES A DAY 300 each 3  . letrozole (FEMARA) 2.5 MG tablet Take 1 tablet (2.5 mg total) by mouth daily. 30 tablet 2  . lidocaine-prilocaine (EMLA) cream Apply to affected area once 30 g 3  . lisinopril (PRINIVIL,ZESTRIL) 40 MG tablet TAKE 1 TABLET (40 MG TOTAL) BY MOUTH DAILY. 30 tablet 5  . metFORMIN (GLUCOPHAGE) 500 MG tablet Take 500 mg by mouth 2 (two) times daily with a meal.    . metFORMIN (GLUCOPHAGE) 500 MG tablet TAKE 2 TABLETS BY MOUTH DAILY WITH SUPPER. 180 tablet 3  . non-metallic deodorant (ALRA)  MISC Apply 1 application topically daily.    Marland Kitchen OVER THE COUNTER MEDICATION Take 1 tablet by mouth daily as needed. Stool softner for constipation, also drinks warm prune juice prn    . rosuvastatin (CRESTOR) 5 MG tablet Take 1 tablet (5 mg total) by mouth daily. -- need labs for further refills 90 tablet 0   No current facility-administered medications on file prior to visit.     Past Medical History:  Diagnosis Date  . Breast cancer ([place]) 11/2016   left  . GERD (gastroesophageal reflux disease)    TUMS as needed  . HTN (hypertension)    states BP has been high recently; has been on med. x 20 yr.  . Hyperlipidemia   . Insulin dependent diabetes mellitus ([place])     Past Surgical History:  Procedure Laterality Date  . BREAST LUMPECTOMY Left 05/05/2008  . BREAST LUMPECTOMY WITH RADIOACTIVE SEED AND SENTINEL LYMPH NODE BIOPSY Left 12/07/2016   Procedure: LEFT BREAST LUMPECTOMY WITH RADIOACTIVE SEED AND SENTINEL LYMPH NODE BIOPSY;  Surgeon: Erroll Luna, MD;  Location: [facility];  Service: General;  Laterality: Left;  . IR FLUORO GUIDE PORT INSERTION RIGHT  01/04/2017  . IR US GUIDE VASC ACCESS RIGHT  01/04/2017  . PORT-A-CATH REMOVAL N/A 05/30/2017   Procedure: REMOVAL PORT-A-CATH;  Surgeon: Erroll Luna, MD;  Location: [facility];  Service: General;  Laterality: N/A;  . RE-EXCISION OF BREAST LUMPECTOMY Left 12/28/2016   Procedure: RE-EXCISION OF BREAST LUMPECTOMY;  Surgeon: Erroll Luna, MD;  Location: [facility];  Service: General;  Laterality: Left;    Social History   Social History  . Marital status: Married    Spouse name: N/A  . Number of children: 4  . Years of education: N/A   Occupational History  . [employer]   Social History Main Topics  . Smoking status: Never Smoker  . Smokeless tobacco: Current User    Types: Snuff  . Alcohol use No  . Drug use: No  . Sexual activity: Not Currently   Other Topics Concern  . Not  on file   Social History Narrative  . No narrative on file    Family History  Problem Relation Age of Onset  . Diabetes Sister        x 4  . Diabetes Brother   . Diabetes Paternal Grandmother   . Diabetes Paternal Aunt   . Heart disease Brother   . Heart disease Sister   . Emphysema Brother   . Cancer Father 19       brain tumor     Review of Systems  Constitutional: Negative for chills and fever.  Respiratory: Negative for shortness of breath.   Cardiovascular: Positive for leg swelling (  mild). Negative for chest pain and palpitations.  Neurological: Negative for dizziness, light-headedness and headaches.       Objective:   Vitals:   06/20/17 0943  BP: 132/64  Pulse: 79  Resp: 16  Temp: 97.9 F (36.6 C)  SpO2: 98%   Filed Weights   06/20/17 0943  Weight: 172 lb (78 kg)   Body mass index is 32.5 kg/m.  Wt Readings from Last 3 Encounters:  06/20/17 172 lb (78 kg)  06/12/17 173 lb 14.4 oz (78.9 kg)  05/30/17 172 lb (78 kg)     Physical Exam Constitutional: Appears well-developed and well-nourished. No distress.  HENT:  Head: Normocephalic and atraumatic.  Neck: Neck supple. No tracheal deviation present. No thyromegaly present.  No cervical lymphadenopathy Cardiovascular: Normal rate, regular rhythm and normal heart sounds.   No murmur heard. No carotid bruit .  No edema Pulmonary/Chest: Effort normal and breath sounds normal. No respiratory distress. No has no wheezes. No rales.  Skin: Skin is warm and dry. Not diaphoretic.  Psychiatric: Normal mood and affect. Behavior is normal.         Assessment & Plan:   See Problem List for Assessment and Plan of chronic medical problems.

## 2017-06-20 NOTE — Assessment & Plan Note (Addendum)
Last week she experienced hypotension and dizziness as a result of being dehydrated and being on multiple blood pressure medications. She received a liter of saline and her symptoms improved. Hydrochlorothiazide was held briefly, but her blood pressure quickly became elevated and was restarted 2 days later.  Lisinopril was also held and not restarted Advised her to monitor closely for dehydration and if that occurs for one reason or another she may need to hold her hydrochlorothiazide if her blood pressure becomes low Monitor BP regularly at home

## 2017-06-22 ENCOUNTER — Telehealth: Payer: Self-pay | Admitting: Internal Medicine

## 2017-06-22 NOTE — Telephone Encounter (Signed)
Ok with me 

## 2017-06-22 NOTE — Telephone Encounter (Signed)
Pt no longer would like to see dr burns. Pt has seen dr Retail banker when he was at [facility] and would like to establish. Can I sch ?

## 2017-06-24 NOTE — Telephone Encounter (Signed)
Ok with me 

## 2017-06-27 ENCOUNTER — Ambulatory Visit: Admission: RE | Admit: 2017-06-27 | Payer: Medicare Other | Source: Ambulatory Visit | Admitting: Radiation Oncology

## 2017-06-27 DIAGNOSIS — E113512 Type 2 diabetes mellitus with proliferative diabetic retinopathy with macular edema, left eye: Secondary | ICD-10-CM | POA: Diagnosis not present

## 2017-06-27 NOTE — Telephone Encounter (Signed)
Pt has been sch

## 2017-06-28 ENCOUNTER — Other Ambulatory Visit: Payer: Self-pay | Admitting: Internal Medicine

## 2017-06-28 NOTE — Telephone Encounter (Signed)
No longer taking

## 2017-06-28 NOTE — Telephone Encounter (Signed)
Not on current med list, please advise

## 2017-06-29 ENCOUNTER — Other Ambulatory Visit: Payer: Self-pay | Admitting: Internal Medicine

## 2017-07-10 ENCOUNTER — Encounter: Payer: Self-pay | Admitting: Internal Medicine

## 2017-07-10 ENCOUNTER — Ambulatory Visit (INDEPENDENT_AMBULATORY_CARE_PROVIDER_SITE_OTHER): Payer: Medicare Other | Admitting: Family Medicine

## 2017-07-10 ENCOUNTER — Encounter: Payer: Self-pay | Admitting: Family Medicine

## 2017-07-10 ENCOUNTER — Other Ambulatory Visit: Payer: Self-pay | Admitting: Internal Medicine

## 2017-07-10 VITALS — BP 138/72 | HR 81 | Temp 98.4°F | Ht 61.0 in | Wt 171.6 lb

## 2017-07-10 DIAGNOSIS — Z170 Estrogen receptor positive status [ER+]: Secondary | ICD-10-CM

## 2017-07-10 DIAGNOSIS — E785 Hyperlipidemia, unspecified: Secondary | ICD-10-CM | POA: Diagnosis not present

## 2017-07-10 DIAGNOSIS — Z23 Encounter for immunization: Secondary | ICD-10-CM

## 2017-07-10 DIAGNOSIS — E113419 Type 2 diabetes mellitus with severe nonproliferative diabetic retinopathy with macular edema, unspecified eye: Secondary | ICD-10-CM | POA: Diagnosis not present

## 2017-07-10 DIAGNOSIS — IMO0002 Reserved for concepts with insufficient information to code with codable children: Secondary | ICD-10-CM

## 2017-07-10 DIAGNOSIS — K625 Hemorrhage of anus and rectum: Secondary | ICD-10-CM | POA: Diagnosis not present

## 2017-07-10 DIAGNOSIS — Z794 Long term (current) use of insulin: Secondary | ICD-10-CM | POA: Diagnosis not present

## 2017-07-10 DIAGNOSIS — F17200 Nicotine dependence, unspecified, uncomplicated: Secondary | ICD-10-CM | POA: Diagnosis not present

## 2017-07-10 DIAGNOSIS — C50412 Malignant neoplasm of upper-outer quadrant of left female breast: Secondary | ICD-10-CM

## 2017-07-10 DIAGNOSIS — I10 Essential (primary) hypertension: Secondary | ICD-10-CM

## 2017-07-10 DIAGNOSIS — E1165 Type 2 diabetes mellitus with hyperglycemia: Secondary | ICD-10-CM | POA: Diagnosis not present

## 2017-07-10 DIAGNOSIS — I5032 Chronic diastolic (congestive) heart failure: Secondary | ICD-10-CM | POA: Diagnosis not present

## 2017-07-10 MED ORDER — ROSUVASTATIN CALCIUM 5 MG PO TABS: 5.0000 mg | ORAL_TABLET | Freq: Every day | ORAL | 3 refills | Status: DC

## 2017-07-10 NOTE — Assessment & Plan Note (Signed)
Went back on chew- advised cessation

## 2017-07-10 NOTE — Assessment & Plan Note (Signed)
Swelling usually controlled by monitoring salt intake. Also on hctz 25mg . Gets short of breath walking around [employer]- can get to car from office without issue

## 2017-07-10 NOTE — Addendum Note (Signed)
Addended by: Lucianne Lei M on: 07/10/2017 01:52 PM   Modules accepted: Orders

## 2017-07-10 NOTE — Assessment & Plan Note (Signed)
S: reasonable control on crestor on last check. Refilled 5mg  today Lab Results  Component Value Date   CHOL 165 10/20/2015   HDL 50.20 10/20/2015   LDLCALC 92 10/20/2015   LDLDIRECT 91 10/25/2012   TRIG 112.0 10/20/2015   CHOLHDL 3 10/20/2015   A/P: update lipids at next visit- needs morning visit. Hopeful LDL at least less than 100, but under 70 even more ideal

## 2017-07-10 NOTE — Assessment & Plan Note (Signed)
Breast cancer left breast- estrogen receptor positie. Detected on biopsy 11/07/16. Left lumpectomy and left axillary sentinal lymph node biopsy with Dr. Brantley Stage. Required re excision for positie margins. Port placed for chemo and also received radiation. plN letrozole 5-7 years. Follows with Dr Burr Medico

## 2017-07-10 NOTE — Progress Notes (Signed)
Phone: [phone]  Subjective:  Patient presents today to establish care with me as their new primary care provider. Patient was formerly a patient of Dr. Quay Burow of [facility]. Chief complaint-noted.   See problem oriented charting ROS- no chest pain. No edema. Stable shortness of breath. No fever or chills.   The following were reviewed and entered/updated in epic: Past Medical History:  Diagnosis Date  . Breast cancer ([place]) 11/2016   left  . GERD (gastroesophageal reflux disease)    TUMS as needed  . HTN (hypertension)    states BP has been high recently; has been on med. x 20 yr.  . Hyperlipidemia   . Insulin dependent diabetes mellitus [facility])    Patient Active Problem List   Diagnosis Date Noted  . Malignant neoplasm of upper-outer quadrant of left breast in female, estrogen receptor positive ([place]) 11/20/2016    Priority: High  . 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 Chronic diastolic heart failure ([place]) 07/06/2011    Priority: High  . Rectal bleeding 01/17/2017    Priority: Medium  . CKD (chronic kidney disease) stage 3, GFR 30-59 ml/min (HCC) 03/17/2016    Priority: Medium  . Osteopenia 01/05/2016    Priority: Medium  . TOBACCO USE 04/13/2008    Priority: Medium  . Hyperlipidemia 11/29/2006    Priority: Medium  . OBESITY, BMI 30-35 11/29/2006    Priority: Medium  . HYPERTENSION, BENIGN SYSTEMIC 11/29/2006    Priority: Medium  . Fatigue 03/14/2017    Priority: Low  . Edema 03/14/2017    Priority: Low  . Port catheter in place 01/18/2017    Priority: Low  . Constipation 01/17/2017    Priority: Low  . Seasonal allergies 12/01/2013    Priority: Low   Past Surgical History:  Procedure Laterality Date  . BREAST LUMPECTOMY Left 05/05/2008  . BREAST LUMPECTOMY WITH RADIOACTIVE SEED AND SENTINEL LYMPH NODE BIOPSY Left 12/07/2016   Procedure: LEFT BREAST LUMPECTOMY WITH RADIOACTIVE SEED AND SENTINEL LYMPH NODE BIOPSY;  Surgeon: Erroll Luna, MD;  Location: [place];  Service: General;  Laterality: Left;  . IR FLUORO GUIDE PORT INSERTION RIGHT  01/04/2017  . IR US GUIDE VASC ACCESS RIGHT  01/04/2017  . PORT-A-CATH REMOVAL N/A 05/30/2017   Procedure: REMOVAL PORT-A-CATH;  Surgeon: Erroll Luna, MD;  Location: [place];  Service: General;  Laterality: N/A;  . RE-EXCISION OF BREAST LUMPECTOMY Left 12/28/2016   Procedure: RE-EXCISION OF BREAST LUMPECTOMY;  Surgeon: Erroll Luna, MD;  Location: [facility] OR;  Service: General;  Laterality: Left;    Family History  Problem Relation Age of Onset  . Diabetes Sister        x 4  . Diabetes Brother   . Diabetes Paternal Grandmother   . Diabetes Paternal Aunt   . Heart disease Brother   . Heart disease Sister   . Emphysema Brother   . Cancer Father 20       brain tumor     Medications- reviewed and updated Current Outpatient Prescriptions  Medication Sig Dispense Refill  . amLODipine (NORVASC) 10 MG tablet TAKE 1 TABLET (10 MG TOTAL) BY MOUTH DAILY. 30 tablet 5  . aspirin 81 MG tablet Take 81 mg by mouth daily.      . B-D INS SYRINGE 0.5CC/31GX5/16 31G X 5/16" 0.5 ML MISC USE AS DIRECTED 4 TIMES A DAY 300  each 1  . carvedilol (COREG) 25 MG tablet TAKE 1 TABLET BY MOUTH TWICE A DAY WITH A MEAL 180 tablet 1  . cloNIDine (CATAPRES) 0.3 MG tablet Take 0.3 mg by mouth 2 (two) times daily.    . hydrochlorothiazide (HYDRODIURIL) 25 MG tablet TAKE 1 TABLET (25 MG TOTAL) BY MOUTH DAILY. 30 tablet 5  . insulin aspart (NOVOLOG) 100 UNIT/ML injection Inject 5-6 Units into the skin 2 (two) times daily with a meal. 10 mL 1  . Insulin Glargine (LANTUS SOLOSTAR) 100 UNIT/ML Solostar Pen Inject 24 Units into the skin every morning. 5 pen 0  . Insulin Pen Needle 31G X 5 MM MISC Use pen needles for insulin injection daily 100 each 3  . Insulin Syringe-Needle  U-100 (B-D INS SYRINGE 0.5CC/30GX1/2") 30G X 1/2" 0.5 ML MISC USE AS DIRECTED 4 TIMES A DAY 300 each 3  . letrozole (FEMARA) 2.5 MG tablet Take 1 tablet (2.5 mg total) by mouth daily. 30 tablet 2  . lidocaine-prilocaine (EMLA) cream Apply to affected area once 30 g 3  . metFORMIN (GLUCOPHAGE) 500 MG tablet TAKE 2 TABLETS BY MOUTH DAILY WITH SUPPER. 180 tablet 3  . non-metallic deodorant (ALRA) MISC Apply 1 application topically daily.    Marland Kitchen OVER THE COUNTER MEDICATION Take 1 tablet by mouth daily as needed. Stool softner for constipation, also drinks warm prune juice prn    . rosuvastatin (CRESTOR) 5 MG tablet Take 1 tablet (5 mg total) by mouth daily. 90 tablet 3   No current facility-administered medications for this visit.     Allergies-reviewed and updated No Known Allergies  Social History   Social History  . Marital status: Married    Spouse name: N/A  . Number of children: 4  . Years of education: N/A   Occupational History  . [employer]   Social History Main Topics  . Smoking status: Never Smoker  . Smokeless tobacco: Current User    Types: Snuff  . Alcohol use No  . Drug use: No  . Sexual activity: Not Currently   Other Topics Concern  . None   Social History Narrative  . None    Objective: BP 138/72 (BP Location: Left Arm, Patient Position: Sitting, Cuff Size: Large)   Pulse 81   Temp 98.4 F (36.9 C) (Oral)   Ht 5\' 1"  (1.549 m)   Wt 171 lb 9.6 oz (77.8 kg)   SpO2 99%   BMI 32.42 kg/m  Gen: NAD, resting comfortably HEENT: Mucous membranes are moist.  CV: RRR no murmurs rubs or gallops Lungs: CTAB no crackles, wheeze, rhonchi Abdomen: soft/nontender/nondistended/normal bowel sounds. obese Ext: no edema Skin: warm, dry Neuro: grossly normal, moves all extremities, PERRLA  Assessment/Plan:  Rectal bleeding Cancelled last appointment due to breast cancer treatment. 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 No myalgias. Working on eating habits after recovering from breast cancer treatment Lab Results  Component Value Date   HGBA1C 7.4 04/25/2017   HGBA1C 8.2 01/08/2017   HGBA1C 9.4 06/07/2016   A/P: continue follow up with Dr. Cruzita Lederer- we discussed healthy eating  Chronic diastolic heart failure Swelling usually controlled by monitoring salt intake. Also  on hctz 25mg . Gets short of breath walking around [employer]- can get to car from office without issue  Hyperlipidemia S: reasonable control on crestor on last check. Refilled 5mg  today Lab Results  Component Value Date   CHOL 165 10/20/2015   HDL 50.20 10/20/2015   LDLCALC 92 10/20/2015   LDLDIRECT 91 10/25/2012   TRIG 112.0 10/20/2015   CHOLHDL 3 10/20/2015   A/P: update lipids at next visit- needs morning visit. Hopeful LDL at least less than 100, but under 70 even more ideal  HYPERTENSION, BENIGN SYSTEMIC S: controlled on hctz 25mg , amlodipine 10mg , coreg 25mg  BID, clonidine 0.3 mg BID. Now off lisinopril. BP Readings from Last 3 Encounters:  07/10/17 138/72  06/20/17 132/64  06/12/17 (!) 129/48  A/P: We discussed blood pressure goal of <140/90. Continue current meds: stay off lisinopril given drop in BP previously on lisinopril   Malignant neoplasm of upper-outer quadrant of left breast in female, estrogen receptor positive ([place]) Breast cancer left breast- estrogen receptor positie. Detected on biopsy 11/07/16. Left lumpectomy and left axillary sentinal lymph node biopsy with Dr. Brantley Stage. Required re excision for positie margins. Port placed for chemo and also received radiation. plN letrozole 5-7 years. Follows with Dr Burr Medico  TOBACCO USE Martin Majestic  back on chew- advised cessation    Future Appointments Date Time Provider [place]  07/25/2017 1:30 PM Hayden Pedro, PA-C [facility] None  07/26/2017 10:30 AM Philemon Kingdom, MD [facility] None  08/24/2017 2:00 PM Gardenia Phlegm, NP [facility] None  10/12/2017 11:15 AM [facility] LAB 1 [facility] None  10/12/2017 11:45 AM [name], MD [facility] None  12/18/2017 1:30 PM Binnie Rail, MD [facility]   Return in about 3 months (around 10/10/2017) for follow up- come fasting and we will update labs.  High dose flu shot today  Orders Placed This Encounter  Procedures  . Ambulatory referral to Gastroenterology    Referral Priority:   Routine    Referral Type:   Consultation    Referral Reason:   Specialty Services Required    Number of Visits Requested:   1    Meds ordered this encounter  Medications  . rosuvastatin (CRESTOR) 5 MG tablet    Sig: Take 1 tablet (5 mg total) by mouth daily.    Dispense:  90 tablet    Refill:  3    Return precautions advised.  Garret Reddish, MD

## 2017-07-10 NOTE — Assessment & Plan Note (Signed)
Cancelled last appointment due to breast cancer treatment. Refer back to GI with last colonoscopy 2005 and rectal bleeding earlier this year

## 2017-07-10 NOTE — Assessment & Plan Note (Signed)
S: controlled on hctz 25mg , amlodipine 10mg , coreg 25mg  BID, clonidine 0.3 mg BID. Now off lisinopril. BP Readings from Last 3 Encounters:  07/10/17 138/72  06/20/17 132/64  06/12/17 (!) 129/48  A/P: We discussed blood pressure goal of <140/90. Continue current meds: stay off lisinopril given drop in BP previously on lisinopril

## 2017-07-10 NOTE — Patient Instructions (Addendum)
We will call you within a week or two about your referral to GI for colonoscopy. If you do not hear within 3 weeks, give [name] a call.   Flu shot today  Sign release of information at the check out desk for last eye exam for diabetes  Quit that chewing tobacco again! You can do this

## 2017-07-10 NOTE — Assessment & Plan Note (Signed)
S: reasonably  controlled on metformin 1g with dinner, lantus 24 units in AM, novolog 15 units TID- smaller dose for smaller meals. No myalgias. Working on eating habits after recovering from breast cancer treatment Lab Results  Component Value Date   HGBA1C 7.4 04/25/2017   HGBA1C 8.2 01/08/2017   HGBA1C 9.4 06/07/2016   A/P: continue follow up with Dr. Cruzita Lederer- we discussed healthy eating

## 2017-07-16 DIAGNOSIS — Z853 Personal history of malignant neoplasm of breast: Secondary | ICD-10-CM | POA: Diagnosis not present

## 2017-07-17 ENCOUNTER — Other Ambulatory Visit: Payer: Self-pay | Admitting: Surgery

## 2017-07-17 DIAGNOSIS — N6489 Other specified disorders of breast: Secondary | ICD-10-CM

## 2017-07-18 ENCOUNTER — Telehealth: Payer: Self-pay | Admitting: Family Medicine

## 2017-07-18 MED ORDER — LOVASTATIN 40 MG PO TABS: 40.0000 mg | ORAL_TABLET | Freq: Every day | ORAL | 3 refills | Status: DC

## 2017-07-18 NOTE — Telephone Encounter (Signed)
Spoke to pt, told her Dr. Yong Channel said you can try Lovastatin 40 mg one tablet daily. Rx sent to pharmacy. Please stop Crestor and start new medication. Pt verbalized understanding.

## 2017-07-18 NOTE — Telephone Encounter (Signed)
Try lovastatin 40mg  daiy #90 with 3 refills

## 2017-07-18 NOTE — Telephone Encounter (Signed)
Looks like pt tried and failed Pravastatin in the past. Please advise.

## 2017-07-18 NOTE — Telephone Encounter (Signed)
Patient would like to be prescribed something less expensive than rosuvastatin (CRESTOR) 5 MG tablet. Please let her know if something else can be prescribed. Patient uses  [id number] [address], [place] [phone] (Phone) [phone] (Fax)

## 2017-07-20 ENCOUNTER — Ambulatory Visit
Admission: RE | Admit: 2017-07-20 | Discharge: 2017-07-20 | Disposition: A | Discharge status: Home / Self Care | Payer: Medicare Other | Source: Ambulatory Visit | Attending: Surgery | Admitting: Surgery

## 2017-07-20 DIAGNOSIS — N6489 Other specified disorders of breast: Secondary | ICD-10-CM | POA: Diagnosis not present

## 2017-07-20 DIAGNOSIS — R928 Other abnormal and inconclusive findings on diagnostic imaging of breast: Secondary | ICD-10-CM | POA: Diagnosis not present

## 2017-07-20 HISTORY — DX: Personal history of antineoplastic chemotherapy: Z92.21

## 2017-07-20 HISTORY — DX: Personal history of irradiation: Z92.3

## 2017-07-25 ENCOUNTER — Encounter: Payer: Self-pay | Admitting: Radiation Oncology

## 2017-07-25 ENCOUNTER — Ambulatory Visit
Admission: RE | Admit: 2017-07-25 | Discharge: 2017-07-25 | Disposition: A | Discharge status: Home / Self Care | Payer: Medicare Other | Source: Ambulatory Visit | Attending: Radiation Oncology | Admitting: Radiation Oncology

## 2017-07-25 VITALS — BP 147/84 | HR 79 | Temp 98.0°F | Resp 18 | Ht 61.0 in | Wt 173.0 lb

## 2017-07-25 DIAGNOSIS — Z794 Long term (current) use of insulin: Secondary | ICD-10-CM | POA: Diagnosis not present

## 2017-07-25 DIAGNOSIS — Z7902 Long term (current) use of antithrombotics/antiplatelets: Secondary | ICD-10-CM | POA: Diagnosis not present

## 2017-07-25 DIAGNOSIS — Z170 Estrogen receptor positive status [ER+]: Secondary | ICD-10-CM | POA: Diagnosis not present

## 2017-07-25 DIAGNOSIS — C50412 Malignant neoplasm of upper-outer quadrant of left female breast: Secondary | ICD-10-CM | POA: Insufficient documentation

## 2017-07-25 DIAGNOSIS — Z9889 Other specified postprocedural states: Secondary | ICD-10-CM | POA: Insufficient documentation

## 2017-07-25 DIAGNOSIS — Z7982 Long term (current) use of aspirin: Secondary | ICD-10-CM | POA: Diagnosis not present

## 2017-07-25 DIAGNOSIS — Z79899 Other long term (current) drug therapy: Secondary | ICD-10-CM | POA: Diagnosis not present

## 2017-07-25 LAB — AEROBIC/ANAEROBIC CULTURE (SURGICAL/DEEP WOUND): CULTURE: NO GROWTH

## 2017-07-25 LAB — AEROBIC/ANAEROBIC CULTURE W GRAM STAIN (SURGICAL/DEEP WOUND)

## 2017-07-26 ENCOUNTER — Other Ambulatory Visit: Payer: Self-pay

## 2017-07-26 ENCOUNTER — Telehealth: Payer: Self-pay | Admitting: Family Medicine

## 2017-07-26 ENCOUNTER — Encounter: Payer: Self-pay | Admitting: Internal Medicine

## 2017-07-26 ENCOUNTER — Ambulatory Visit (INDEPENDENT_AMBULATORY_CARE_PROVIDER_SITE_OTHER): Payer: Medicare Other | Admitting: Internal Medicine

## 2017-07-26 VITALS — BP 120/74 | HR 69 | Temp 98.0°F | Ht 61.0 in | Wt 174.0 lb

## 2017-07-26 DIAGNOSIS — Z794 Long term (current) use of insulin: Secondary | ICD-10-CM | POA: Diagnosis not present

## 2017-07-26 DIAGNOSIS — IMO0002 Reserved for concepts with insufficient information to code with codable children: Secondary | ICD-10-CM

## 2017-07-26 DIAGNOSIS — E1165 Type 2 diabetes mellitus with hyperglycemia: Secondary | ICD-10-CM | POA: Diagnosis not present

## 2017-07-26 DIAGNOSIS — E113419 Type 2 diabetes mellitus with severe nonproliferative diabetic retinopathy with macular edema, unspecified eye: Secondary | ICD-10-CM | POA: Diagnosis not present

## 2017-07-26 DIAGNOSIS — E785 Hyperlipidemia, unspecified: Secondary | ICD-10-CM

## 2017-07-26 LAB — POCT GLYCOSYLATED HEMOGLOBIN (HGB A1C): Hemoglobin A1C: 8.4

## 2017-07-26 MED ORDER — BASAGLAR KWIKPEN 100 UNIT/ML ~~LOC~~ SOPN: 24.0000 [IU] | PEN_INJECTOR | Freq: Every day | SUBCUTANEOUS | 11 refills | Status: DC

## 2017-07-26 MED ORDER — INSULIN LISPRO 100 UNIT/ML (KWIKPEN): 6.0000 [IU] | PEN_INJECTOR | Freq: Three times a day (TID) | SUBCUTANEOUS | 0 refills | Status: DC

## 2017-07-26 MED ORDER — INSULIN LISPRO 100 UNIT/ML (KWIKPEN): 6.0000 [IU] | PEN_INJECTOR | Freq: Three times a day (TID) | SUBCUTANEOUS | 11 refills | Status: DC

## 2017-07-26 NOTE — Progress Notes (Signed)
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  Interval Since Last Radiation:  10 weeks   04/23/17 - 05/18/16: Left breast/ 42.5 Gy in 17 Fx Boost / 7.5 Gy in 3 Fx   Narrative:  The patient returns today for routine follow-up. During treatment she did very well with radiotherapy and did not have significant desquamation. She did have a complicated course of a draining seroma during treatment.                       On review of systems, the patient states she's doing well. She denies any concerns with her skin from radiation but reports she's no longer seeing fullness in the breast and had this aspirated last week again with Dr. Brantley Stage.   ALLERGIES:  has No Known Allergies.  Meds: Current Outpatient Prescriptions  Medication Sig Dispense Refill  . amLODipine (NORVASC) 10 MG tablet TAKE 1 TABLET (10 MG TOTAL) BY MOUTH DAILY. 30 tablet 5  . aspirin 81 MG tablet Take 81 mg by mouth daily.      . B-D INS SYRINGE 0.5CC/31GX5/16 31G X 5/16" 0.5 ML MISC USE AS DIRECTED 4 TIMES A DAY 300 each 1  . carvedilol (COREG) 25 MG tablet TAKE 1 TABLET BY MOUTH TWICE A DAY WITH A MEAL 180 tablet 1  . cloNIDine (CATAPRES) 0.3 MG tablet Take 0.3 mg by mouth 2 (two) times daily.    . hydrochlorothiazide (HYDRODIURIL) 25 MG tablet TAKE 1 TABLET (25 MG TOTAL) BY MOUTH DAILY. 30 tablet 5  . insulin aspart (NOVOLOG) 100 UNIT/ML injection Inject 5-6 Units into the skin 2 (two) times daily with a meal. 10 mL 1  . Insulin Glargine (LANTUS SOLOSTAR) 100 UNIT/ML Solostar Pen Inject 24 Units into the skin every morning. 5 pen 0  . Insulin Pen Needle 31G X 5 MM MISC Use pen needles for insulin injection daily 100 each 3  . Insulin  Syringe-Needle U-100 (B-D INS SYRINGE 0.5CC/30GX1/2") 30G X 1/2" 0.5 ML MISC USE AS DIRECTED 4 TIMES A DAY 300 each 3  . letrozole (FEMARA) 2.5 MG tablet Take 1 tablet (2.5 mg total) by mouth daily. 30 tablet 2  . lovastatin (MEVACOR) 40 MG tablet Take 1 tablet (40 mg total) by mouth at bedtime. 90 tablet 3  . metFORMIN (GLUCOPHAGE) 500 MG tablet TAKE 2 TABLETS BY MOUTH DAILY WITH SUPPER. 180 tablet 3  . OVER THE COUNTER MEDICATION Take 1 tablet by mouth daily as needed. Stool softner for constipation, also drinks warm prune juice prn     No current facility-administered medications for this encounter.     Physical Findings:  height is 5\' 1"  (1.549 m) and weight is 173 lb (78.5 kg). Her oral temperature is 98 F (36.7 C). Her blood pressure is 147/84 (abnormal) and her pulse is 79. Her respiration is 18 and oxygen saturation is 100%.  Pain Assessment Pain Score: 0-No pain/10 In general this is a well appearing African American female in no acute distress. She's alert and oriented x4 and appropriate throughout the examination. Cardiopulmonary assessment is negative for acute distress and she exhibits normal effort. The left breast was examined and reveals size  distortion since lumpectomy compared to the right with mild retraction of her lumpectomy scar. No evidence of hyperpigmentation is noted over the breast proper but there is still mild hyperpigmentation along the upper chest wall. She does not have any palpable fullness to suggest fluid in the breast.   Lab Findings: Lab Results  Component Value Date   WBC 4.7 06/12/2017   HGB 11.4 (L) 06/12/2017   HCT 34.0 (L) 06/12/2017   MCV 82.9 06/12/2017   PLT 218 06/12/2017     Radiographic Findings: US Breast Ltd Uni Left Inc Axilla  Result Date: 07/20/2017 CLINICAL DATA:  History of treated left breast cancer, status post lumpectomy, radiation and chemotherapy earlier this year. EXAM: 2D DIGITAL DIAGNOSTIC BILATERAL MAMMOGRAM WITH CAD AND  ADJUNCT TOMO ULTRASOUND LEFT BREAST COMPARISON:  Previous exam(s). ACR Breast Density Category b: There are scattered areas of fibroglandular density. FINDINGS: Mammographically, there are no suspicious masses, areas of architectural distortion or microcalcifications in the right breast. Postsurgical and posttreatment changes are seen in the left breast with low-density round focal asymmetry in the lumpectomy site. Skin thickening is noted. Mammographic images were processed with CAD. Targeted ultrasound is performed, showing left breast 1 o'clock 1 cm from the nipple loculated fluid collection measuring 3.8 x 2.1 x 3.2 cm. No suspicious masses or shadowing lesions. Slight skin thickening noted. IMPRESSION: No mammographic evidence of malignancy in either breast. 3.8 cm left breast postsurgical loculated seroma. RECOMMENDATION: The patient will be scheduled for a aspiration of her left breast seroma, for symptoms relief. Otherwise, Diagnostic mammogram is suggested in 1 year. (Code:DM-B-01Y) I have discussed the findings and recommendations with the patient. Results were also provided in writing at the conclusion of the visit. If applicable, a reminder letter will be sent to the patient regarding the next appointment. BI-RADS CATEGORY  2: Benign. Electronically Signed   By: Fidela Salisbury M.D.   On: 07/20/2017 13:35   Mm Diag Breast Tomo Bilateral  Result Date: 07/20/2017 CLINICAL DATA:  History of treated left breast cancer, status post lumpectomy, radiation and chemotherapy earlier this year. EXAM: 2D DIGITAL DIAGNOSTIC BILATERAL MAMMOGRAM WITH CAD AND ADJUNCT TOMO ULTRASOUND LEFT BREAST COMPARISON:  Previous exam(s). ACR Breast Density Category b: There are scattered areas of fibroglandular density. FINDINGS: Mammographically, there are no suspicious masses, areas of architectural distortion or microcalcifications in the right breast. Postsurgical and posttreatment changes are seen in the left breast  with low-density round focal asymmetry in the lumpectomy site. Skin thickening is noted. Mammographic images were processed with CAD. Targeted ultrasound is performed, showing left breast 1 o'clock 1 cm from the nipple loculated fluid collection measuring 3.8 x 2.1 x 3.2 cm. No suspicious masses or shadowing lesions. Slight skin thickening noted. IMPRESSION: No mammographic evidence of malignancy in either breast. 3.8 cm left breast postsurgical loculated seroma. RECOMMENDATION: The patient will be scheduled for a aspiration of her left breast seroma, for symptoms relief. Otherwise, Diagnostic mammogram is suggested in 1 year. (Code:DM-B-01Y) I have discussed the findings and recommendations with the patient. Results were also provided in writing at the conclusion of the visit. If applicable, a reminder letter will be sent to the patient regarding the next appointment. BI-RADS CATEGORY  2: Benign. Electronically Signed   By: Fidela Salisbury M.D.   On: 07/20/2017 13:35   US Breast Aspiration Left  Result Date: 07/20/2017 CLINICAL DATA:  76 year old female status post left lumpectomy in March 2018. Patient presents for drainage of a left breast postoperative seroma for  symptom relief. EXAM: ULTRASOUND GUIDED LEFT BREAST CYST ASPIRATION COMPARISON:  Previous exams. PROCEDURE: Using sterile technique, 1% lidocaine, under direct ultrasound visualization, needle aspiration of a left breast seroma was performed using an 18 gauge needle. Approximately 20 cc of serosanguineous fluid was drained and sent to the microbiology lab for analysis. IMPRESSION: Ultrasound-guided aspiration of a left breast seroma. No apparent complications. RECOMMENDATIONS: Clinical and symptomatic follow-up is recommended for the patient's symptoms. Otherwise, diagnostic mammogram is suggested in 1 year. Electronically Signed   By: Kristopher Oppenheim M.D.   On: 07/20/2017 13:48    Impression/Plan: 1. 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 The patient has been doing well since completion of radiotherapy. If she notes recurrent accumulation of her left breast she will contact Dr. Brantley Stage again. This does not appear to have reaccumulated since last week's aspiration. We discussed that we would be happy to continue to follow her as needed, but she will also continue to follow up with Dr. Burr Medico in medical oncology. She was counseled on skin care as well as measures to avoid sun exposure to this area.  2. Survivorship. We discussed the resources here at the cancer center and she's counseled on the role of survivorship clinic.     Carola Rhine, PAC

## 2017-07-26 NOTE — Patient Instructions (Addendum)
Please continue: - Metformin 1000 mg with dinner - Basaglar 24 units in am  Please start taking Humalog before EVERY meal: - Humalog 15 min before meals: - 6 units before a smaller meal - 8 units before a regular meal - 10 units before a large meal or if you have dessert Try to skip the snack at night, if sugars >100.  Please return in 3 months with your sugar log.

## 2017-07-26 NOTE — Progress Notes (Signed)
Patient ID: Wendy [name], female   DOB: 02-May-1941, 76 y.o.   MRN: [id number]  HPI: Wendy [name] is a 76 y.o.-year-old female, returning for f/u for DM2, dx in 67s, insulin-dependent since 1990, uncontrolled, with complications (CKD, DR). Last visit 3 mo ago.  She had L breast lumpectomy ([facility]) >> had ChTx  X 4 weeks, then RxTx - finished.  She has not been watching her diet as closely c/w last visit. Also, upon Q'ing, she is not taking the Novolog with L and D, only with B... (??)  Last hemoglobin A1c was: Lab Results  Component Value Date   HGBA1C 7.4 04/25/2017   HGBA1C 8.2 01/08/2017   HGBA1C 9.4 06/07/2016    Pt is on: - Metformin 1000 mg with dinner - Lantus 24 units in am - Novolog 15 min before meals - only in am: - 6 units before a smaller meal - 8 units before a regular meal - 10 units before a large meal or if you have dessert Try to skip the snack at night, if sugars >100.  Pt checks her sugars 3x a day - no log, no meter: - am:  1114, 189-236 >> 125-226 >> 70, 170-200s, 270 - 2h after b'fast: n/c >> 116-270 >> n/c - before lunch: 70-215 >> 128-171, 235 >> 140s - 2h after lunch: 200, 203 >> n/c >> 269 >> n/c - before dinner: 194 >> 209 >> 210, 244 >> 200s - 2h after dinner: 174, 302 >> 120-174 >> 135 >> n/c - bedtime: 200 >> n/c  >> 198, 322 >> 200s - nighttime: 1244-290 >> n/c >> 73 x1, 130 >> n/c Lowest sugar was 32 >> 40 (delayed meal) >> 52 x1 >> 70 >> 73 >> 70s; she has hypoglycemia awareness at 70s.  Highest sugar was 270 >> 322 >> 300  Glucometer: [employer]  Pt's meals are: - Breakfast: toast, egg, bacon/sausage; oatmeal - Lunch: [place] sandwich, soups, crackers - Dinner: baked chicken, potatoes, cabbage (dinner is her largest meal) - Snacks: PB and crackers  - she has  CKD, last BUN/creatinine:  Lab Results  Component Value Date   BUN 14.9 06/12/2017   CREATININE 1.1 06/12/2017  On Lisinopril. - last set of lipids: Lab Results  Component  Value Date   CHOL 165 10/20/2015   HDL 50.20 10/20/2015   LDLCALC 92 10/20/2015   LDLDIRECT 91 10/25/2012   TRIG 112.0 10/20/2015   CHOLHDL 3 10/20/2015  She had to stop Crestor 5 mg due to muscle pain >> now back on it. 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 She had a Laser tx. Usually sees Dr. Kathlen Mody. - + neuropathic pain in feet On ASA 81.  She is a caregiver for her husband, who is in a wheelchair.   ROS: Constitutional: no weight gain/no weight loss, no fatigue, no subjective hyperthermia, no subjective hypothermia, + nocturia Eyes: + blurry vision, no xerophthalmia ENT: no sore throat, no nodules palpated in throat, no dysphagia, no odynophagia, no hoarseness Cardiovascular: no CP/no SOB/no palpitations/no leg swelling Respiratory: no cough/no SOB/no wheezing Gastrointestinal: no N/no V/no D/no C/no acid reflux Musculoskeletal: no muscle aches/no joint aches Skin: no rashes, no hair loss Neurological: no tremors/no numbness/no tingling/no dizziness  I reviewed pt's medications, allergies, PMH, social hx, family hx, and changes were documented in the history of present illness. Otherwise, unchanged from my initial visit note.  Past Medical History:  Diagnosis Date  . Breast cancer (  [place]) 11/2016   left  . GERD (gastroesophageal reflux disease)    TUMS as needed  . HTN (hypertension)    states BP has been high recently; has been on med. x 20 yr.  . Hyperlipidemia   . Insulin dependent diabetes mellitus ([place])   . Personal history of chemotherapy    2018  . Personal history of radiation therapy    2018 left breast   Past Surgical History:  Procedure Laterality Date  . BREAST LUMPECTOMY Left 05/05/2008  . BREAST LUMPECTOMY Left 12/07/2016   malignant  . BREAST LUMPECTOMY WITH RADIOACTIVE SEED AND SENTINEL LYMPH NODE BIOPSY Left 12/07/2016   Procedure: LEFT BREAST LUMPECTOMY WITH RADIOACTIVE SEED AND SENTINEL LYMPH NODE BIOPSY;  Surgeon:  Erroll Luna, MD;  Location: [facility];  Service: General;  Laterality: Left;  . IR FLUORO GUIDE PORT INSERTION RIGHT  01/04/2017  . IR US GUIDE VASC ACCESS RIGHT  01/04/2017  . PORT-A-CATH REMOVAL N/A 05/30/2017   Procedure: REMOVAL PORT-A-CATH;  Surgeon: Erroll Luna, MD;  Location: [facility];  Service: General;  Laterality: N/A;  . RE-EXCISION OF BREAST LUMPECTOMY Left 12/28/2016   Procedure: RE-EXCISION OF BREAST LUMPECTOMY;  Surgeon: Erroll Luna, MD;  Location: [facility];  Service: General;  Laterality: Left;   Social History   Social History  . Marital Status: Married    Spouse Name: N/A  . Number of Children: 4   . retired    Social History Main Topics  . Smoking status: Never Smoker   . Smokeless tobacco: Former Systems developer    Types: Snuff    Quit date: 10/02/2014     Comment: Quit 10/02/2014  previous 1 box per week of snuff   . Alcohol Use: No  . Drug Use: No   Current Outpatient Prescriptions on File Prior to Visit  Medication Sig Dispense Refill  . amLODipine (NORVASC) 10 MG tablet TAKE 1 TABLET (10 MG TOTAL) BY MOUTH DAILY. 30 tablet 5  . aspirin 81 MG tablet Take 81 mg by mouth daily.      . B-D INS SYRINGE 0.5CC/31GX5/16 31G X 5/16" 0.5 ML MISC USE AS DIRECTED 4 TIMES A DAY 300 each 1  . carvedilol (COREG) 25 MG tablet TAKE 1 TABLET BY MOUTH TWICE A DAY WITH A MEAL 180 tablet 1  . cloNIDine (CATAPRES) 0.3 MG tablet Take 0.3 mg by mouth 2 (two) times daily.    . hydrochlorothiazide (HYDRODIURIL) 25 MG tablet TAKE 1 TABLET (25 MG TOTAL) BY MOUTH DAILY. 30 tablet 5  . insulin aspart (NOVOLOG) 100 UNIT/ML injection Inject 5-6 Units into the skin 2 (two) times daily with a meal. 10 mL 1  . Insulin Glargine (LANTUS SOLOSTAR) 100 UNIT/ML Solostar Pen Inject 24 Units into the skin every morning. 5 pen 0  . Insulin Pen Needle 31G X 5 MM MISC Use pen needles for insulin injection daily 100 each 3  . Insulin Syringe-Needle U-100 (B-D INS SYRINGE  0.5CC/30GX1/2") 30G X 1/2" 0.5 ML MISC USE AS DIRECTED 4 TIMES A DAY 300 each 3  . letrozole (FEMARA) 2.5 MG tablet Take 1 tablet (2.5 mg total) by mouth daily. 30 tablet 2  . lovastatin (MEVACOR) 40 MG tablet Take 1 tablet (40 mg total) by mouth at bedtime. 90 tablet 3  . metFORMIN (GLUCOPHAGE) 500 MG tablet TAKE 2 TABLETS BY MOUTH DAILY WITH SUPPER. 180 tablet 3  . OVER THE COUNTER MEDICATION Take 1 tablet by mouth daily as needed. Stool softner  for constipation, also drinks warm prune juice prn     No current facility-administered medications on file prior to visit.    No Known Allergies Family History  Problem Relation Age of Onset  . Diabetes Sister        x 4  . Diabetes Brother   . Diabetes Paternal Grandmother   . Diabetes Paternal Aunt   . Heart disease Brother   . Heart disease Sister   . Emphysema Brother   . Cancer Father 44       brain tumor    PE: BP 120/74   Pulse 69   Temp 98 F (36.7 C) (Oral)   Ht 5' 1" (1.549 m)   Wt 174 lb (78.9 kg)   SpO2 100%   BMI 32.88 kg/m  Body mass index is 32.88 kg/m. Wt Readings from Last 3 Encounters:  07/26/17 174 lb (78.9 kg)  07/25/17 173 lb (78.5 kg)  07/10/17 171 lb 9.6 oz (77.8 kg)   Constitutional: overweight, in NAD Eyes: PERRLA, EOMI, no exophthalmos ENT: moist mucous membranes, no thyromegaly, no cervical lymphadenopathy Cardiovascular: RRR, No MRG Respiratory: CTA B Gastrointestinal: abdomen soft, NT, ND, BS+ Musculoskeletal: no deformities, strength intact in all 4 Skin: moist, warm, no rashes Neurological: no tremor with outstretched hands, DTR normal in all 4  ASSESSMENT: 1. DM2, insulin-dependent, uncontrolled, with complications - + DR (severe, non-proliferative)  2. HL  PLAN:  1.  Patient with long-standing uncontrolled diabetes, on basal-bolus insulin regimen and also metformin, with higher sugars at this visit per her recall, after she relaxed her diet and stopped taking her mealtime insulin with  lunch and dinner. She is not sure why she stopped taking these .Marland Kitchen - we discussed about  continuing the current regimen, except changing to [employer] per insurance preference is, but also increasing the dose of Humalog that she takes before meals. She is currently taking the lowest dose recommended, 6 units, but we discussed to go to 8 units were regular meal and adjust the dose up or down depending on the size of the meal.  - I suggested to:  Patient Instructions  Please continue: - Metformin 1000 mg with dinner - Basaglar 24 units in am  Please start taking Humalog before EVERY meal: - Humalog 15 min before meals: - 6 units before a smaller meal - 8 units before a regular meal - 10 units before a large meal or if you have dessert Try to skip the snack at night, if sugars >100.  Please return in 3 months with your sugar log.   - today, HbA1c is 8.4% (higher!) - continue checking sugars at different times of the day - check 3x a day, rotating checks - given more sugar logs -  - advised for yearly eye exams >> she is UTD - UTD with flu shot - Return to clinic in 3 mo with sugar log    2. HL - Reviewed latest lipid panel >> great! - She is back on Crestor 5 mg - will need a lipid panel  - will check fasting at next visit  Philemon Kingdom, MD PhD [facility] End[facility]

## 2017-07-26 NOTE — Addendum Note (Signed)
Addended by: Nile Riggs on: 07/26/2017 03:43 PM   Modules accepted: Orders

## 2017-07-26 NOTE — Telephone Encounter (Signed)
Patient's daughter in law called in reference to cheaper  Insulin options for patient. Patient was informed by pharmacist that Dr. Yong Channel would need to contact patient's insurance. Please advise.

## 2017-07-26 NOTE — Telephone Encounter (Signed)
Spoke with daughter. I printed out a 1 month prescription for the Humalog insulin and provided a voucher for a free month. I also provided discount cards for both the Basaglar and the Humalog. Daughter will pick up from the front desk tomorrow.

## 2017-08-07 ENCOUNTER — Other Ambulatory Visit: Payer: Self-pay

## 2017-08-07 MED ORDER — AMLODIPINE BESYLATE 10 MG PO TABS: ORAL_TABLET | ORAL | 1 refills | Status: DC

## 2017-08-15 ENCOUNTER — Telehealth: Payer: Self-pay | Admitting: Family Medicine

## 2017-08-15 NOTE — Telephone Encounter (Signed)
Patient called in reference to having "constipation" for about 2 weeks. Patient stated she has also been having gas. Patient would like to know if there was something over the counter Dr. Yong Channel could recommend. Patient stated she is unable to come in for an appointment today. Please call patient and advise. OK to leave message.

## 2017-08-15 NOTE — Telephone Encounter (Signed)
Called patient but no answer was received and voicemail box has not been set up yet.

## 2017-08-15 NOTE — Telephone Encounter (Signed)
Trial 1 capful miralax for 3 days- if no good BM can take twice a day for 2-3 days then see [name] if no BM

## 2017-08-15 NOTE — Telephone Encounter (Signed)
Patient returning missed phone call. Please advise.

## 2017-08-15 NOTE — Telephone Encounter (Signed)
Spoke to pt, told her Dr. Yong Channel said Trial 1 capful miralax for 3 days- if no good BM can take twice a day for 2-3 days then see [name] if no BM. Pt verbalized understanding.

## 2017-08-17 ENCOUNTER — Telehealth: Payer: Self-pay

## 2017-08-17 NOTE — Telephone Encounter (Signed)
Spoke with pt regarding SCP visit on 11/23 @ 2 pm. Pt states she will come to appt.

## 2017-08-22 ENCOUNTER — Ambulatory Visit (INDEPENDENT_AMBULATORY_CARE_PROVIDER_SITE_OTHER): Payer: Medicare Other | Admitting: Family Medicine

## 2017-08-22 ENCOUNTER — Encounter: Payer: Self-pay | Admitting: Family Medicine

## 2017-08-22 VITALS — BP 110/82 | HR 73 | Temp 98.1°F | Ht 61.0 in | Wt 176.2 lb

## 2017-08-22 DIAGNOSIS — K5900 Constipation, unspecified: Secondary | ICD-10-CM | POA: Diagnosis not present

## 2017-08-22 NOTE — Progress Notes (Signed)
Subjective:  Wendy [name] is a 76 y.o. year old very pleasant female patient who presents for/with See problem oriented charting ROS- no fever or chills. Mild lower abdominal pain. No nausea or vomiting.    Past Medical History-  Patient Active Problem List   Diagnosis Date Noted  . Malignant neoplasm of upper-outer quadrant of left breast in female, estrogen receptor positive ([place]) 11/20/2016    Priority: High  . 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 Chronic diastolic heart failure ([place]) 07/06/2011    Priority: High  . Rectal bleeding 01/17/2017    Priority: Medium  . CKD (chronic kidney disease) stage 3, GFR 30-59 ml/min ([facility]) 03/17/2016    Priority: Medium  . Osteopenia 01/05/2016    Priority: Medium  . TOBACCO USE 04/13/2008    Priority: Medium  . Hyperlipidemia 11/29/2006    Priority: Medium  . OBESITY, BMI 30-35 11/29/2006    Priority: Medium  . HYPERTENSION, BENIGN SYSTEMIC 11/29/2006    Priority: Medium  . Fatigue 03/14/2017    Priority: Low  . Edema 03/14/2017    Priority: Low  . Port catheter in place 01/18/2017    Priority: Low  . Constipation 01/17/2017    Priority: Low  . Seasonal allergies 12/01/2013    Priority: Low    Medications- reviewed and updated Current Outpatient Medications  Medication Sig Dispense Refill  . amLODipine (NORVASC) 10 MG tablet TAKE 1 TABLET (10 MG TOTAL) BY MOUTH DAILY. 90 tablet 1  . aspirin 81 MG tablet Take 81 mg by mouth daily.      . B-D INS SYRINGE 0.5CC/31GX5/16 31G X 5/16" 0.5 ML MISC USE AS DIRECTED 4 TIMES A DAY 300 each 1  . carvedilol (COREG) 25 MG tablet TAKE 1 TABLET BY MOUTH TWICE A DAY WITH A MEAL 180 tablet 1  . cloNIDine (CATAPRES) 0.3 MG tablet Take 0.3 mg by mouth 2 (two) times daily.    . hydrochlorothiazide (HYDRODIURIL) 25 MG tablet TAKE 1 TABLET (25 MG TOTAL) BY MOUTH DAILY.  30 tablet 5  . Insulin Glargine (BASAGLAR KWIKPEN) 100 UNIT/ML SOPN Inject 0.24 mLs (24 Units total) into the skin at bedtime. 5 pen 11  . insulin lispro (HUMALOG KWIKPEN) 100 UNIT/ML KiwkPen Inject 0.06-0.1 mLs (6-10 Units total) into the skin 3 (three) times daily. 1 pen 0  . Insulin Pen Needle 31G X 5 MM MISC Use pen needles for insulin injection daily 100 each 3  . Insulin Syringe-Needle U-100 (B-D INS SYRINGE 0.5CC/30GX1/2") 30G X 1/2" 0.5 ML MISC USE AS DIRECTED 4 TIMES A DAY 300 each 3  . letrozole (FEMARA) 2.5 MG tablet Take 1 tablet (2.5 mg total) by mouth daily. 30 tablet 2  . lisinopril (PRINIVIL,ZESTRIL) 40 MG tablet Take 1 tablet by mouth daily.  5  . lovastatin (MEVACOR) 40 MG tablet Take 1 tablet (40 mg total) by mouth at bedtime. 90 tablet 3  . metFORMIN (GLUCOPHAGE) 500 MG tablet TAKE 2 TABLETS BY MOUTH DAILY WITH SUPPER. 180 tablet 3  . OVER THE COUNTER MEDICATION Take 1 tablet by mouth daily as needed. Stool softner for constipation, also drinks warm prune juice prn     No current facility-administered medications for this visit.     Objective: BP 110/82 (BP Location: Left Arm, Patient Position: Sitting, Cuff Size: Large)   Pulse 73   Temp 98.1 F (36.7 C) (Oral)  Ht 5\' 1"  (1.549 m)   Wt 176 lb 3.2 oz (79.9 kg)   SpO2 99%   BMI 33.29 kg/m  Gen: NAD, resting comfortably CV: RRR no murmurs rubs or gallops Lungs: CTAB no crackles, wheeze, rhonchi Abdomen: soft/mildly tender lower abdomen/nondistended/normal bowel sounds. No rebound or guarding.  Ext: no edema Skin: warm, dry, no rash over abdomen Rectal: no stool burden, no obvious hemorrhoids  Assessment/Plan:  Constipation S: when goes to bathroom she strains and then has little balls of stool. Only going every 3-4 days. Rarely will have loose stools as well. 2-3 months of symptoms but some issues back to april. Drinks 4-5 glasses of water a day.   Today states last bowel movement 2-3 weeks. Passing a lot of  gas. Has tried sennakot, miralax- 6 days without bowel movement. No recent narcotics. Some mild lower abdominal pain/cramping with this.   Has upcoming colonoscopy on 09/10/17.  A/P: Patient with continued issues with bowel movements since at least April. Worse over last month. 6 days of miralax without BM. Will have her continue this. Will have her add magnesium citrate laxative today, fleets enema tomorrow if no success. Discussed follow up Friday hours if worsening symptoms- otherwise update [name] next week.   Strongly encouraged her to follow through with colonoscopy   Future Appointments  Date Time Provider [place]  08/24/2017  2:00 PM Gardenia Phlegm, NP [facility] None  08/28/2017 11:00 AM [facility] PREVISIT RM 51 [facility]  09/10/2017 11:00 AM Pyrtle, Lajuan Lines, MD [facility]  10/10/2017 10:15 AM Marin Olp, MD [facility] None  10/12/2017 11:15 AM [facility] LAB 1 [facility] None  10/12/2017 11:45 AM [name], MD [facility] None  10/30/2017 10:30 AM Philemon Kingdom, MD [facility] None  12/18/2017  1:30 PM Burns, Claudina Lick, MD [facility]   Meds ordered this encounter  Medications  . lisinopril (PRINIVIL,ZESTRIL) 40 MG tablet    Sig: Take 1 tablet by mouth daily.    Refill:  5   Return precautions advised.  Garret Reddish, MD

## 2017-08-22 NOTE — Patient Instructions (Signed)
Glad you came in  I want you to take a bottle of magnesium citrate laxative liquid today (see picture)  If no bowel movement by tomorrow- then get a fleet enema over the counter  Continue miralax daily and your excellent water intake. Let me know if no bowel movement by next week. See [name] back if worsening abdominal pain

## 2017-08-22 NOTE — Assessment & Plan Note (Signed)
S: when goes to bathroom she strains and then has little balls of stool. Only going every 3-4 days. Rarely will have loose stools as well. 2-3 months of symptoms but some issues back to april. Drinks 4-5 glasses of water a day.   Today states last bowel movement 2-3 weeks. Passing a lot of gas. Has tried sennakot, miralax- 6 days without bowel movement. No recent narcotics. Some mild lower abdominal pain/cramping with this.   Has upcoming colonoscopy on 09/10/17.  A/P: Patient with continued issues with bowel movements since at least April. Worse over last month. 6 days of miralax without BM. Will have her continue this. Will have her add magnesium citrate laxative today, fleets enema tomorrow if no success. Discussed follow up Friday hours if worsening symptoms- otherwise update [name] next week.   Strongly encouraged her to follow through with colonoscopy

## 2017-08-24 ENCOUNTER — Encounter: Payer: Medicare Other | Admitting: Adult Health

## 2017-08-27 ENCOUNTER — Other Ambulatory Visit: Payer: Self-pay

## 2017-08-27 MED ORDER — INSULIN GLARGINE 100 UNIT/ML SOLOSTAR PEN: PEN_INJECTOR | SUBCUTANEOUS | 5 refills | Status: DC

## 2017-08-28 ENCOUNTER — Ambulatory Visit (AMBULATORY_SURGERY_CENTER): Payer: Self-pay

## 2017-08-28 VITALS — Ht 61.5 in

## 2017-08-28 DIAGNOSIS — K625 Hemorrhage of anus and rectum: Secondary | ICD-10-CM

## 2017-08-28 NOTE — Progress Notes (Signed)
Per pt, no allergies to soy or egg products.Pt not taking any weight loss meds or using  O2 at home.  Emmi video sent to daughter-n-law email.   Patient has had rectal bleeding, recent radiation and chemo for breast cancer and having left breast drainage with pain. Pt also states she is unable to lay on her left side for colon.  An OV was scheduled with Danella Sensing PA to evaluate for a colon. Colon on 09/10/17 was not cancelled at this time. Pt aware.

## 2017-09-05 ENCOUNTER — Telehealth: Payer: Self-pay

## 2017-09-05 ENCOUNTER — Ambulatory Visit (INDEPENDENT_AMBULATORY_CARE_PROVIDER_SITE_OTHER): Payer: Medicare Other | Admitting: Physician Assistant

## 2017-09-05 ENCOUNTER — Encounter: Payer: Self-pay | Admitting: Physician Assistant

## 2017-09-05 VITALS — BP 132/62 | HR 73 | Ht 62.5 in | Wt 176.0 lb

## 2017-09-05 DIAGNOSIS — K5900 Constipation, unspecified: Secondary | ICD-10-CM | POA: Diagnosis not present

## 2017-09-05 DIAGNOSIS — Z1211 Encounter for screening for malignant neoplasm of colon: Secondary | ICD-10-CM | POA: Diagnosis not present

## 2017-09-05 MED ORDER — NA SULFATE-K SULFATE-MG SULF 17.5-3.13-1.6 GM/177ML PO SOLN: 1.0000 | ORAL | 0 refills | Status: DC

## 2017-09-05 NOTE — Patient Instructions (Signed)
_  _   ORAL DIABETIC MEDICATION INSTRUCTIONS  The day before your procedure:  Take your diabetic pill as you do normally  The day of your procedure:  Do not take your diabetic pill   We will check your blood sugar levels during the admission process and again in Recovery before discharging you home  ______________________________________________________________________  _  _   INSULIN (LONG ACTING) MEDICATION INSTRUCTIONS (Lantus, NPH, 70/30, Humulin, Novolin-N, Levemir, Toujeo )   The day before your procedure:  Take  your regular evening dose    The day of your procedure:  Do not take your morning dose   _  _   INSULIN (SHORT ACTING) MEDICATION INSTRUCTIONS (Regular, Humulog, Novolog, Apidra, Novolin, Humulin)   The day before your procedure:  Do not take your evening dose   The day of your procedure:  Do not take your morning dose  ______________________________________________________________________                _ _ OTHER NON-INSULIN INJECTABLE MEDICATIONS         (Tanzeum, Trulicity, Byetta, Victoza, Bydureon, SymlinPen)  Hold the am of the procedure   _  _   INSULIN PUMP MEDICATION INSTRUCTIONS We will contact the physician managing your diabetic care for written dosage instructions for the day before your procedure and the day of your procedure.  Once we have received the instructions, we will contact you.   You have been scheduled for a colonoscopy. Please follow written instructions given to you at your visit today.  Please pick up your prep supplies at the pharmacy within the next 1-3 days. If you use inhalers (even only as needed), please bring them with you on the day of your procedure. Your physician has requested that you go to www.startemmi.com and enter the access code given to you at your visit today. This web site gives a general overview about your procedure. However, you should still follow specific instructions given to you by our  office regarding your preparation for the procedure.

## 2017-09-05 NOTE — Telephone Encounter (Signed)
Spoke to Wendy [name] reminding her of SCP appt on 09/11/17 at 10 am.  Wendy [name] said she will come for appt.

## 2017-09-05 NOTE — Progress Notes (Addendum)
Chief Complaint: Rectal bleeding  HPI:    Wendy [name] is a 76 year old African-American female with a past medical history of recent breast cancer, having just finished chemo and radiation at the end of September, reflux and others listed below, who was referred to me by Marin Olp, MD for a complaint of rectal bleeding.      Patient follows with Dr. Hilarie Fredrickson and was last seen in clinic 10/13/13.  At that time, she was being evaluated for constipation which was controlled on Senokot docusate tabs once daily.  Her previous colonoscopy had been performed 10 years prior to that with removal of 2 polyps which were hyperplastic.  Patient was continued on her laxative regiment with senna plus docusate and a screening colonoscopy was discussed but patient decided not to have this done and proceed with Cologuard.  I do not see record of cologuard results in her chart.    Today, the patient presents to clinic because she tells me she has been seen upstairs and was told to follow with me prior to time of her colonoscopy.  Her upcoming colonoscopy is scheduled on Monday with Dr. Hilarie Fredrickson.  Patient tells me that she was diagnosed with breast cancer in March of this year and underwent chemo and radiation which finished in September.  Her oncologist encouraged her to have her colonoscopy.    Patient tells me that she does have chronic constipation and last week was seen by her primary care provider and described not having a bowel movement for 2 weeks prior to that.  Patient was told to take magnesium citrate and warm prune juice and tells me she had a very large and multiple bowel movements last week.  Since then she had daily bowel movements.  Patient does describe seeing something "red" in her stool at one point while she was constipated but also tells me that the stool softener she was taking had a red coating.  Patient has not seen any since this one instance, she is unsure if it was blood.    Patient also  discusses that she was having some leakage from her left breast when seen upstairs for her preprocedure meeting and was unsure if she would be able to lay on this side, since then she has followed with her oncologist who told her that some breast leakage can be normal.  Patient tells me that this has since dried up and she has no discomfort on this side at this time and will have no trouble laying on her left side for colonoscopy.    Patient denies any other complaints today.  She denies fever, chills, continued blood in her stool, melena, weight loss, anorexia, nausea, vomiting or symptoms that awaken her night.  Past Medical History:  Diagnosis Date  . Abnormal breast finding 2018   per pt/ having  a lot drainage from left breast nipple  . Breast cancer ([place]) 11/2016   left/  . GERD (gastroesophageal reflux disease)    TUMS as needed  . HTN (hypertension)    states BP has been high recently; has been on med. x 20 yr.  . Hyperlipidemia   . Insulin dependent diabetes mellitus ([place])   . Personal history of chemotherapy    2018/finished 6 weeks of chemo in Sep 2018  . Personal history of radiation therapy    2018 left breast/finished radiation in Sept 2018 per pt.    Past Surgical History:  Procedure Laterality Date  . BREAST LUMPECTOMY Left 05/05/2008  .  BREAST LUMPECTOMY Left 12/07/2016   malignant  . BREAST LUMPECTOMY WITH RADIOACTIVE SEED AND SENTINEL LYMPH NODE BIOPSY Left 12/07/2016   Procedure: LEFT BREAST LUMPECTOMY WITH RADIOACTIVE SEED AND SENTINEL LYMPH NODE BIOPSY;  Surgeon: Erroll Luna, MD;  Location: [facility];  Service: General;  Laterality: Left;  . IR FLUORO GUIDE PORT INSERTION RIGHT  01/04/2017  . IR US GUIDE VASC ACCESS RIGHT  01/04/2017  . PORT-A-CATH REMOVAL N/A 05/30/2017   Procedure: REMOVAL PORT-A-CATH;  Surgeon: Erroll Luna, MD;  Location: [facility];  Service: General;  Laterality: N/A;  . RE-EXCISION OF BREAST LUMPECTOMY Left  12/28/2016   Procedure: RE-EXCISION OF BREAST LUMPECTOMY;  Surgeon: Erroll Luna, MD;  Location: [facility];  Service: General;  Laterality: Left;    Current Outpatient Medications  Medication Sig Dispense Refill  . amLODipine (NORVASC) 10 MG tablet TAKE 1 TABLET (10 MG TOTAL) BY MOUTH DAILY. 90 tablet 1  . aspirin 81 MG tablet Take 81 mg by mouth daily.      . B-D INS SYRINGE 0.5CC/31GX5/16 31G X 5/16" 0.5 ML MISC USE AS DIRECTED 4 TIMES A DAY 300 each 1  . carvedilol (COREG) 25 MG tablet TAKE 1 TABLET BY MOUTH TWICE A DAY WITH A MEAL 180 tablet 1  . cloNIDine (CATAPRES) 0.3 MG tablet Take 0.3 mg by mouth 2 (two) times daily.    . hydrochlorothiazide (HYDRODIURIL) 25 MG tablet TAKE 1 TABLET (25 MG TOTAL) BY MOUTH DAILY. 30 tablet 5  . Insulin Glargine (LANTUS SOLOSTAR) 100 UNIT/ML Solostar Pen Inject 24 units into the skin 10 pen 5  . insulin lispro (HUMALOG KWIKPEN) 100 UNIT/ML KiwkPen Inject 0.06-0.1 mLs (6-10 Units total) into the skin 3 (three) times daily. 1 pen 0  . Insulin Pen Needle 31G X 5 MM MISC Use pen needles for insulin injection daily 100 each 3  . Insulin Syringe-Needle U-100 (B-D INS SYRINGE 0.5CC/30GX1/2") 30G X 1/2" 0.5 ML MISC USE AS DIRECTED 4 TIMES A DAY 300 each 3  . letrozole (FEMARA) 2.5 MG tablet Take 1 tablet (2.5 mg total) by mouth daily. 30 tablet 2  . lisinopril (PRINIVIL,ZESTRIL) 40 MG tablet Take 1 tablet by mouth daily.  5  . lovastatin (MEVACOR) 40 MG tablet Take 1 tablet (40 mg total) by mouth at bedtime. 90 tablet 3  . metFORMIN (GLUCOPHAGE) 500 MG tablet TAKE 2 TABLETS BY MOUTH DAILY WITH SUPPER. 180 tablet 3  . OVER THE COUNTER MEDICATION Take 1 tablet by mouth daily as needed. Stool softner for constipation, also drinks warm prune juice prn    . Na Sulfate-K Sulfate-Mg Sulf 17.5-3.13-1.6 GM/177ML SOLN Take 1 kit by mouth as directed. 354 mL 0   No current facility-administered medications for this visit.     Allergies as of 09/05/2017  . (No Known  Allergies)    Family History  Problem Relation Age of Onset  . Diabetes Sister        x 4  . Diabetes Brother   . Diabetes Paternal Grandmother   . Diabetes Paternal Aunt   . Heart disease Brother   . Heart disease Sister   . Emphysema Brother   . Cancer Father 34       brain tumor   . Colon cancer Neg Hx   . Stomach cancer Neg Hx     Social History   Socioeconomic History  . Marital status: Married    Spouse name: Not on file  . Number of children: 4  .  Years of education: Not on file  . Highest education level: Not on file  Social Needs  . Financial resource strain: Not on file  . Food insecurity - worry: Not on file  . Food insecurity - inability: Not on file  . Transportation needs - medical: Not on file  . Transportation needs - non-medical: Not on file  Occupational History  . Occupation: DAY Armed forces operational officer: [employer]  Tobacco Use  . Smoking status: Former Smoker    Types: Cigarettes  . Smokeless tobacco: Current User    Types: Snuff  . Tobacco comment: dip snuff twice a week  Substance and Sexual Activity  . Alcohol use: No    Alcohol/week: 0.0 oz  . Drug use: No  . Sexual activity: Not Currently    Partners: Male  Other Topics Concern  . Not on file  Social History Narrative  . Not on file    Review of Systems:    Constitutional: No weight loss, fever or chills Skin: No rash  Cardiovascular: No chest pain  Respiratory: No SOB  Gastrointestinal: See HPI and otherwise negative Genitourinary: No dysuria Neurological: No headache Musculoskeletal: No new muscle or joint pain Hematologic: No bruising Psychiatric: No history of depression or anxiety    Physical Exam:  Vital signs: BP 132/62   Pulse 73   Ht 5' 2.5" (1.588 m)   Wt 176 lb (79.8 kg)   BMI 31.68 kg/m   Constitutional:   Pleasant AA female appears to be in NAD, Well developed, Well nourished, alert and cooperative Head:  Normocephalic and atraumatic. Eyes:    PEERL, EOMI. No icterus. Conjunctiva pink. Ears:  Normal auditory acuity. Neck:  Supple Throat: Oral cavity and pharynx without inflammation, swelling or lesion.  Respiratory: Respirations even and unlabored. Lungs clear to auscultation bilaterally.   No wheezes, crackles, or rhonchi.  Cardiovascular: Normal S1, S2. No MRG. Regular rate and rhythm. No peripheral edema, cyanosis or pallor.  Gastrointestinal:  Soft, nondistended, nontender. No rebound or guarding. Normal bowel sounds. No appreciable masses or hepatomegaly. Rectal:  Not performed.  Msk:  Symmetrical without gross deformities. Without edema, no deformity or joint abnormality.  Neurologic:  Alert and  oriented x4;  grossly normal neurologically.  Skin:   Dry and intact without significant lesions or rashes. Psychiatric: Demonstrates good judgement and reason without abnormal affect or behaviors.  MOST RECENT LABS AND IMAGING: CBC    Component Value Date/Time   WBC 4.7 06/12/2017 1005   WBC 22.6 Repeated and verified X2. (HH) 03/14/2017 1519   RBC 4.10 06/12/2017 1005   RBC 3.54 (L) 03/14/2017 1519   HGB 11.4 (L) 06/12/2017 1005   HCT 34.0 (L) 06/12/2017 1005   PLT 218 06/12/2017 1005   MCV 82.9 06/12/2017 1005   MCH 27.8 06/12/2017 1005   MCH 27.5 01/04/2017 1209   MCHC 33.5 06/12/2017 1005   MCHC 34.1 03/14/2017 1519   RDW 15.4 (H) 06/12/2017 1005   LYMPHSABS 0.7 (L) 06/12/2017 1005   MONOABS 0.2 06/12/2017 1005   EOSABS 0.1 06/12/2017 1005   BASOSABS 0.0 06/12/2017 1005    CMP     Component Value Date/Time   NA 141 06/12/2017 1005   K 4.0 06/12/2017 1005   CL 101 03/14/2017 1519   CO2 26 06/12/2017 1005   GLUCOSE 184 (H) 06/12/2017 1005   BUN 14.9 06/12/2017 1005   CREATININE 1.1 06/12/2017 1005   CALCIUM 9.6 06/12/2017 1005   PROT 7.2  06/12/2017 1005   ALBUMIN 3.5 06/12/2017 1005   AST 8 06/12/2017 1005   ALT <6 06/12/2017 1005   ALKPHOS 75 06/12/2017 1005   BILITOT 0.29 06/12/2017 1005   GFRNONAA  42 (L) 12/28/2016 0835   GFRAA 48 (L) 12/28/2016 0835    Assessment: 1.  Screening for colorectal cancer: It has been 13 years since patient's last colonoscopy with a finding of 2 hyperplastic polyps, she had recent diagnosis of breast cancer which was treated this year, finishing chemo and radiation in September, she is encouraged to get her screening colonoscopy by her oncologist 2.  Constipation: Currently resolved  Plan: 1.  Patient is already scheduled for colonoscopy with Dr. Hilarie Fredrickson in the [facility] on Monday 09/10/17.  Did discuss risks, benefits, limitations and alternatives and the patient agrees to proceed.  Patient was specifically given the bad weather number to call as we are expecting snow this weekend. 2.  Patient to follow in clinic per recommendations from Dr. Hilarie Fredrickson after time of procedure.  Ellouise Newer, PA-C [facility] 09/05/2017, 12:40 PM  Cc: Marin Olp, MD   Addendum: Reviewed and agree with initial management. Pyrtle, Lajuan Lines, MD

## 2017-09-09 ENCOUNTER — Other Ambulatory Visit: Payer: Self-pay | Admitting: Hematology

## 2017-09-10 ENCOUNTER — Encounter: Payer: Medicare Other | Admitting: Internal Medicine

## 2017-09-11 ENCOUNTER — Other Ambulatory Visit: Payer: Self-pay | Admitting: *Deleted

## 2017-09-11 MED ORDER — LETROZOLE 2.5 MG PO TABS: 2.5000 mg | ORAL_TABLET | Freq: Every day | ORAL | 2 refills | Status: DC

## 2017-09-12 ENCOUNTER — Ambulatory Visit (HOSPITAL_BASED_OUTPATIENT_CLINIC_OR_DEPARTMENT_OTHER): Payer: Medicare Other | Admitting: Adult Health

## 2017-09-12 ENCOUNTER — Encounter: Payer: Self-pay | Admitting: Adult Health

## 2017-09-12 VITALS — BP 105/58 | HR 74 | Temp 98.8°F | Resp 18 | Ht 62.5 in | Wt 174.9 lb

## 2017-09-12 DIAGNOSIS — Z79811 Long term (current) use of aromatase inhibitors: Secondary | ICD-10-CM

## 2017-09-12 DIAGNOSIS — Z170 Estrogen receptor positive status [ER+]: Secondary | ICD-10-CM | POA: Diagnosis not present

## 2017-09-12 DIAGNOSIS — L7634 Postprocedural seroma of skin and subcutaneous tissue following other procedure: Secondary | ICD-10-CM

## 2017-09-12 DIAGNOSIS — C50412 Malignant neoplasm of upper-outer quadrant of left female breast: Secondary | ICD-10-CM

## 2017-09-12 NOTE — Progress Notes (Signed)
CLINIC:  Survivorship   REASON FOR VISIT:  Routine follow-up post-treatment for a recent history of breast cancer.  BRIEF ONCOLOGIC HISTORY:  Oncology History   Cancer Staging Malignant neoplasm of upper-outer quadrant of left breast in female, estrogen receptor positive ([place]) Staging form: Breast, AJCC 8th Edition - Clinical: Stage IIB (cT2, cN0, cM0, G3, ER: Positive, PR: Negative, HER2: Negative) - Signed by [name], MD on 11/23/2016 - Pathologic stage from 12/28/2016: Stage IIB (pT2, pN1a(sn), cM0, G3, ER: Positive, PR: Negative, HER2: Negative) - Signed by [name], MD on 01/02/2017       Malignant neoplasm of upper-outer quadrant of left breast in female, estrogen receptor positive ([place])   11/01/2016 Mammogram    Diagnostic mammogram and ultrasound of left breast and axilla showed a 3.1 x 2.1 x 1.4 cm (3.3 x 2.0 x 2.7 cm by ultrasound) mass in the upper outer quadrant of the anterior third of the left breast, associated with pleomorphic calcification. There is a 8 mm (1.8cm by [name]) prominent lymph node in the left axilla.      11/07/2016 Initial Biopsy    Left breast 1:00 position biopsy showed invasive ductal carcinoma and DCIS, G3, left axillary node biopsy was negative.      11/07/2016 Receptors her2    ER 80% positive, PR negative, HER-2 negative, Ki-67 90%      11/20/2016 Initial Diagnosis    Malignant neoplasm of upper-outer quadrant of left breast in female, estrogen receptor positive ([place])      12/07/2016 Surgery    Left lumpectomy and left axillary sentinel lymph node sampling by Dr. Brantley Stage      12/07/2016 Pathology Results    pT2, pN1 Left Lumpectomy: Grade 3 IDC measuring 3.4 cm, carcinoma broadly present at the superior margin. Grade 3 DCIS. 1 out of 2 left axillary SLN positive for metastatic carcinoma      12/07/2016 Miscellaneous    Mammaprint showed high risk disease, basal type       12/28/2016 Surgery    Re-excision of the previously positive superior  margin was negative for malignant cells.       01/04/2017 Surgery    Port inserted      01/18/2017 - 03/22/2017 Chemotherapy    Adjuvant Docetaxel 75 mg/m and Cytoxan 600 mg/m, every 21 days, for total of 4 cycles, with Neulasta on day 2.       04/23/2017 - 05/18/2017 Radiation Therapy    Radiation treatment dates:   04/23/17 - 05/18/17 Administered by Dr. Lisbeth Renshaw  Site/dose:    Left breast/ 42.5 Gy in 17 Fx Boost / 7.5 Gy in 3 Fx      06/12/2017 -  Anti-estrogen oral therapy    Adjuvant letrozole 1 mg daily, plan for 5-7 years        INTERVAL HISTORY:  Wendy [name] presents to the [facility] today for our initial meeting to review her survivorship care plan detailing her treatment course for breast cancer, as well as monitoring long-term side effects of that treatment, education regarding health maintenance, screening, and overall wellness and health promotion.     Overall, Wendy [name] is doing well today.  She is taking the Letrozole every morning.  She tolerates it well.  She denies any issues with arthralgias, vaginal dryness, hot flashes.      REVIEW OF SYSTEMS:  Review of Systems  Constitutional: Negative for appetite change, chills, diaphoresis, fatigue, fever and unexpected weight change.  HENT:   Negative for hearing  loss.   Eyes: Negative for eye problems and icterus.  Respiratory: Negative for chest tightness, cough, shortness of breath and wheezing.   Cardiovascular: Negative for chest pain, leg swelling and palpitations.  Gastrointestinal: Negative for abdominal distention, abdominal pain, constipation, diarrhea, nausea and vomiting.  Endocrine: Negative for hot flashes.  Musculoskeletal: Negative for arthralgias.  Skin: Negative for itching and rash.  Neurological: Negative for dizziness, extremity weakness, headaches and numbness.  Hematological: Negative for adenopathy. Does not bruise/bleed easily.  Psychiatric/Behavioral: Negative for depression. The patient  is not nervous/anxious.   Breast: Denies any new nodularity, masses, tenderness, nipple changes, or nipple discharge.      ONCOLOGY TREATMENT TEAM:  1. Surgeon:  Dr. Brantley Stage at [facility] 2. Medical Oncologist: Dr. Burr Medico  3. Radiation Oncologist: Dr. Lisbeth Renshaw    PAST MEDICAL/SURGICAL HISTORY:  Past Medical History:  Diagnosis Date  . Abnormal breast finding 2018   per pt/ having  a lot drainage from left breast nipple  . Breast cancer ([place]) 11/2016   left/  . GERD (gastroesophageal reflux disease)    TUMS as needed  . HTN (hypertension)    states BP has been high recently; has been on med. x 20 yr.  . Hyperlipidemia   . Insulin dependent diabetes mellitus ([place])   . Personal history of chemotherapy    2018/finished 6 weeks of chemo in Sep 2018  . Personal history of radiation therapy    2018 left breast/finished radiation in Sept 2018 per pt.   Past Surgical History:  Procedure Laterality Date  . BREAST LUMPECTOMY Left 05/05/2008  . BREAST LUMPECTOMY Left 12/07/2016   malignant  . BREAST LUMPECTOMY WITH RADIOACTIVE SEED AND SENTINEL LYMPH NODE BIOPSY Left 12/07/2016   Procedure: LEFT BREAST LUMPECTOMY WITH RADIOACTIVE SEED AND SENTINEL LYMPH NODE BIOPSY;  Surgeon: Erroll Luna, MD;  Location: [facility];  Service: General;  Laterality: Left;  . IR FLUORO GUIDE PORT INSERTION RIGHT  01/04/2017  . IR US GUIDE VASC ACCESS RIGHT  01/04/2017  . PORT-A-CATH REMOVAL N/A 05/30/2017   Procedure: REMOVAL PORT-A-CATH;  Surgeon: Erroll Luna, MD;  Location: [facility];  Service: General;  Laterality: N/A;  . RE-EXCISION OF BREAST LUMPECTOMY Left 12/28/2016   Procedure: RE-EXCISION OF BREAST LUMPECTOMY;  Surgeon: Erroll Luna, MD;  Location: [facility];  Service: General;  Laterality: Left;     ALLERGIES:  No Known Allergies   CURRENT MEDICATIONS:  Outpatient Encounter Medications as of 09/12/2017  Medication Sig  . amLODipine (NORVASC) 10 MG  tablet TAKE 1 TABLET (10 MG TOTAL) BY MOUTH DAILY.  Marland Kitchen aspirin 81 MG tablet Take 81 mg by mouth daily.    . B-D INS SYRINGE 0.5CC/31GX5/16 31G X 5/16" 0.5 ML MISC USE AS DIRECTED 4 TIMES A DAY  . carvedilol (COREG) 25 MG tablet TAKE 1 TABLET BY MOUTH TWICE A DAY WITH A MEAL  . cloNIDine (CATAPRES) 0.3 MG tablet Take 0.3 mg by mouth 2 (two) times daily.  . hydrochlorothiazide (HYDRODIURIL) 25 MG tablet TAKE 1 TABLET (25 MG TOTAL) BY MOUTH DAILY.  Marland Kitchen Insulin Glargine (LANTUS SOLOSTAR) 100 UNIT/ML Solostar Pen Inject 24 units into the skin  . insulin lispro (HUMALOG KWIKPEN) 100 UNIT/ML KiwkPen Inject 0.06-0.1 mLs (6-10 Units total) into the skin 3 (three) times daily.  . Insulin Pen Needle 31G X 5 MM MISC Use pen needles for insulin injection daily  . Insulin Syringe-Needle U-100 (B-D INS SYRINGE 0.5CC/30GX1/2") 30G X 1/2" 0.5 ML MISC USE AS  DIRECTED 4 TIMES A DAY  . letrozole (FEMARA) 2.5 MG tablet Take 1 tablet (2.5 mg total) by mouth daily.  Marland Kitchen lisinopril (PRINIVIL,ZESTRIL) 40 MG tablet Take 1 tablet by mouth daily.  Marland Kitchen lovastatin (MEVACOR) 40 MG tablet Take 1 tablet (40 mg total) by mouth at bedtime.  . metFORMIN (GLUCOPHAGE) 500 MG tablet TAKE 2 TABLETS BY MOUTH DAILY WITH SUPPER.  . Na Sulfate-K Sulfate-Mg Sulf 17.5-3.13-1.6 GM/177ML SOLN Take 1 kit by mouth as directed.  Marland Kitchen OVER THE COUNTER MEDICATION Take 1 tablet by mouth daily as needed. Stool softner for constipation, also drinks warm prune juice prn   No facility-administered encounter medications on file as of 09/12/2017.      ONCOLOGIC FAMILY HISTORY:  Family History  Problem Relation Age of Onset  . Diabetes Sister        x 4  . Diabetes Brother   . Diabetes Paternal Grandmother   . Diabetes Paternal Aunt   . Heart disease Brother   . Heart disease Sister   . Emphysema Brother   . Cancer Father 72       brain tumor   . Colon cancer Neg Hx   . Stomach cancer Neg Hx      GENETIC COUNSELING/TESTING: Not at this  time  SOCIAL HISTORY:  ANGELENA SAND is married and lives with her husband in [place], [place].  She has 4 children and they live in [place] and one in [place].  Ms. Dorantes is currently retired.  She denies any current or history of cigarette use, alcohol, or illicit drug use.  She uses snuff daily, and is working on quitting.  She is also working on getting in with a dentist.   PHYSICAL EXAMINATION:  Vital Signs:   Vitals:   09/12/17 0958  BP: (!) 105/58  Pulse: 74  Resp: 18  Temp: 98.8 F (37.1 C)  SpO2: 98%   Filed Weights   09/12/17 0958  Weight: 174 lb 14.4 oz (79.3 kg)   General: Well-nourished, well-appearing female in no acute distress.  She is accompanied today by her son, and her son's daughter.   HEENT: Head is normocephalic.  Pupils equal and reactive to light. Conjunctivae clear without exudate.  Sclerae anicteric. Oral mucosa is pink, moist.  Oropharynx is pink without lesions or erythema.  Lymph: No cervical, supraclavicular, or infraclavicular lymphadenopathy noted on palpation.  Cardiovascular: Regular rate and rhythm.Marland Kitchen Respiratory: Clear to auscultation bilaterally. Chest expansion symmetric; breathing non-labored.  Breasts: leftt breast, still with clear drainage from seroma, lumpectomy sit ewith mod amt of scar tissue, no nodules or masses, right breast without nodules masses skin or nipple changes GI: Abdomen soft and round; non-tender, non-distended. Bowel sounds normoactive.  GU: Deferred.  Neuro: No focal deficits. Steady gait.  Psych: Mood and affect normal and appropriate for situation.  Extremities: No edema. MSK: No focal spinal tenderness to palpation.  Full range of motion in bilateral upper extremities Skin: Warm and dry.  LABORATORY DATA:  None for this visit.  DIAGNOSTIC IMAGING:  None for this visit.      ASSESSMENT AND PLAN:  Ms.. Wendy [name] is a pleasant 76 y.o. female with Stage IIB left breast invasive ductal carcinoma,  ER+/PR-/HER2-, diagnosed in 11/2016, treated with lumpectomy, adjuvant chemotherapy, adjuvant radiation therapy, and anti-estrogen therapy with Letrozole beginning in 06/2017.  She presents to the Survivorship Clinic for our initial meeting and routine follow-up post-completion of treatment for breast cancer.    1. Stage IIB left breast  cancer:  Ms. Osterman is continuing to recover from definitive treatment for breast cancer. She will follow-up with her medical oncologist, Dr. Burr Medico in 12/2017 with history and physical exam per surveillance protocol.  She will continue her anti-estrogen therapy with Letrozole. Thus far, she is tolerating the Letrozole well, with minimal side effects. She was instructed to make Dr. Burr Medico or myself aware if she begins to experience any worsening side effects of the medication and I could see her back in clinic to help manage those side effects, as needed.  Today, a comprehensive survivorship care plan and treatment summary was reviewed with the patient today detailing her breast cancer diagnosis, treatment course, potential late/long-term effects of treatment, appropriate follow-up care with recommendations for the future, and patient education resources.  A copy of this summary, along with a letter will be sent to the patient's primary care provider via mail/fax/In Basket message after today's visit.    2. Left breast seroma: This continues to be an issue for her.  Will reach out to her surgeon to see when they want f/u.    3. Bone health:  Given Ms. Eckerson age/history of breast cancer and her current treatment regimen including anti-estrogen therapy with Letrozole, she is at risk for bone demineralization.  Her last DEXA scan was in 11/2017 and was consistent with osteopenia with a t score of -1.2 in the left hip.  She will be due for repeat bone density in 11/2017.  In the meantime, she was encouraged to increase her consumption of foods rich in calcium, as well as increase her  weight-bearing activities.  She was given education on specific activities to promote bone health.  4. Cancer screening:  Due to Ms. Herbert's history and her age, she should receive screening for skin cancers, colon cancer, oral cancer and gynecologic cancers.  The information and recommendations are listed on the patient's comprehensive care plan/treatment summary and were reviewed in detail with the patient.    5. Health maintenance and wellness promotion: Ms. Allebach was encouraged to consume 5-7 servings of fruits and vegetables per day. We reviewed the "Nutrition Rainbow" handout, as well as the handout "Take Control of Your Health and Reduce Your Cancer Risk" from the [employer].  She was also encouraged to engage in moderate to vigorous exercise for 30 minutes per day most days of the week. We discussed the [employer] fitness program, which is designed for cancer survivors to help them become more physically fit after cancer treatments.  She was instructed to limit her alcohol consumption and was encouraged stop oral tobacco use.     6. Support services/counseling: It is not uncommon for this period of the patient's cancer care trajectory to be one of many emotions and stressors.  We discussed an opportunity for her to participate in the next session of [facility] ("Finding Your New Normal") support group series designed for patients after they have completed treatment.   Ms. Degracia was encouraged to take advantage of our many other support services programs, support groups, and/or counseling in coping with her new life as a cancer survivor after completing anti-cancer treatment.  She was offered support today through active listening and expressive supportive counseling.  She was given information regarding our available services and encouraged to contact me with any questions or for help enrolling in any of our support group/programs.    Dispo:   -Return to cancer center for f/u with Dr.  Burr Medico in 12/2017 -Mammogram due in 07/2018 -  Repeat bone density with Dr. Quay Burow in 11/2017 -Follow up with Dr. Brantley Stage at his discretion -She is welcome to return back to the Survivorship Clinic at any time; no additional follow-up needed at this time.  -Consider referral back to survivorship as a long-term survivor for continued surveillance  A total of (30) minutes of face-to-face time was spent with this patient with greater than 50% of that time in counseling and care-coordination.   Gardenia Phlegm, [place] [phone]   Note: PRIMARY CARE PROVIDER Marin Olp, [place] [phone]

## 2017-09-13 ENCOUNTER — Other Ambulatory Visit: Payer: Self-pay

## 2017-09-13 MED ORDER — METFORMIN HCL 500 MG PO TABS: ORAL_TABLET | ORAL | 0 refills | Status: DC

## 2017-09-18 ENCOUNTER — Telehealth: Payer: Self-pay | Admitting: Medical Oncology

## 2017-09-18 ENCOUNTER — Telehealth: Payer: Self-pay | Admitting: Hematology

## 2017-09-18 IMAGING — XA IR US GUIDE VASC ACCESS RIGHT
1 series · 2 of 2 positions shown · non-contrast
Comparison: None.

INDICATION: Malignant neoplasm of upper-outer quadrant of left breast.
Port-A-Cath needed for treatment.

EXAM:
FLUOROSCOPIC AND ULTRASOUND GUIDED PLACEMENT OF A SUBCUTANEOUS PORT

[Series 300: ir fluoro guide port insertion right · 2 of 2 slices shown]
[im 1/2]
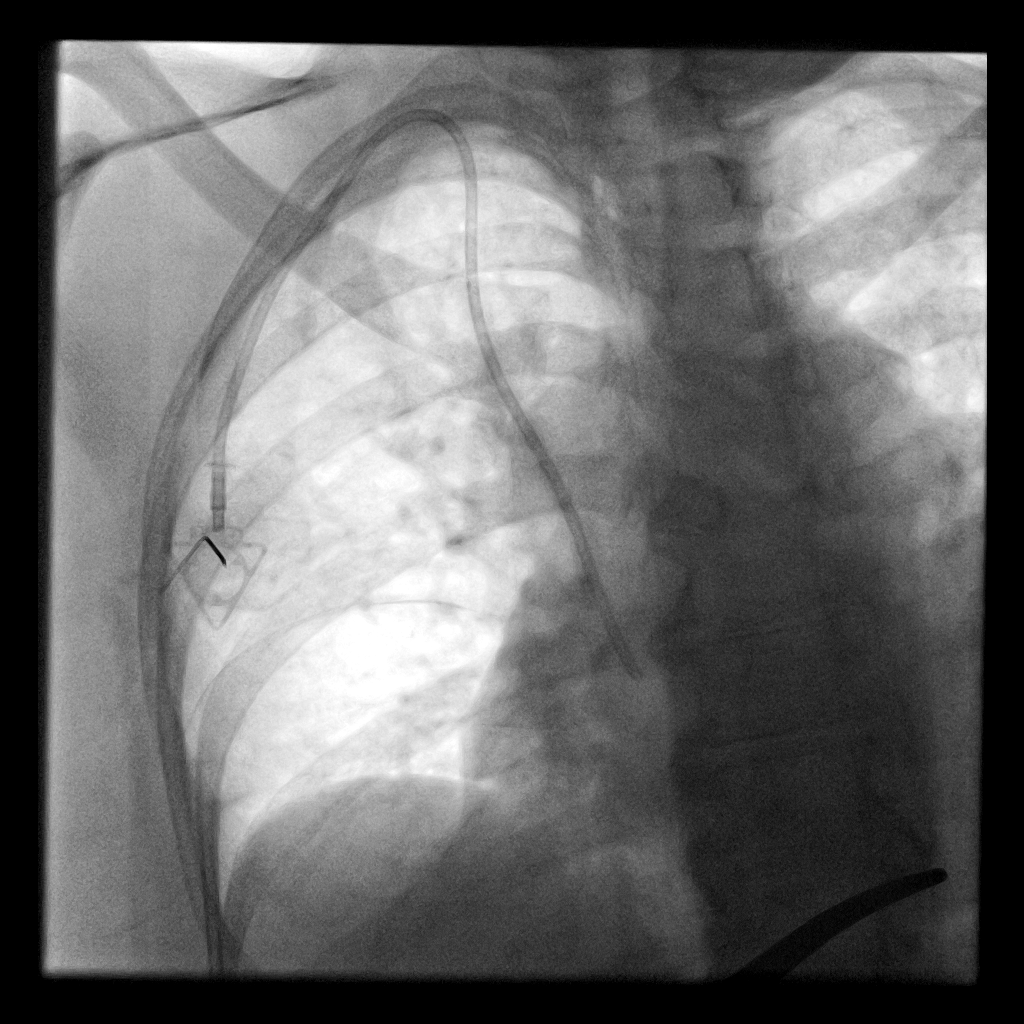
[im 2/2]
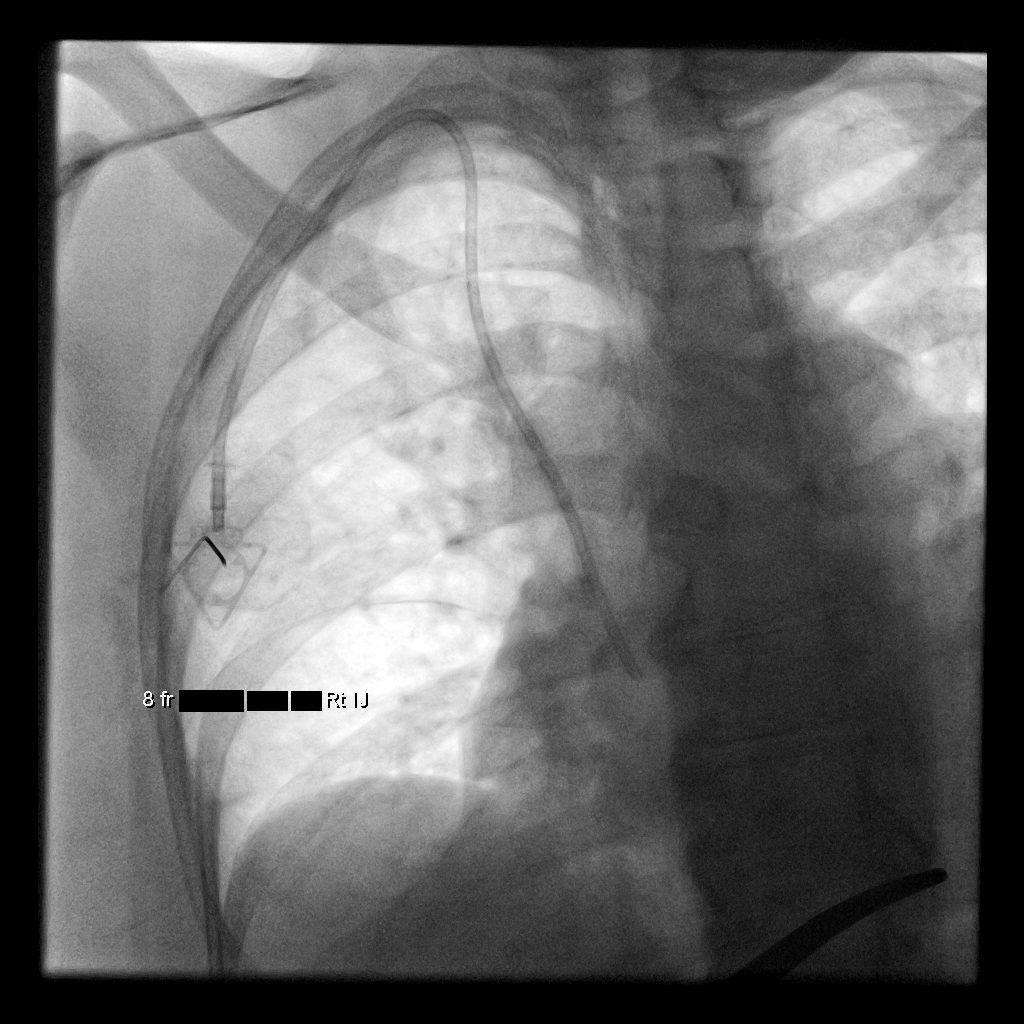

[2 of 2 positions shown; findings below may reference images not displayed]

MEDICATIONS:
Ancef 2 g; The antibiotic was administered within an appropriate
time interval prior to skin puncture.

ANESTHESIA/SEDATION:
Versed 2.0 mg IV; Fentanyl 75 mcg IV;

Moderate Sedation Time:  28 minutes

The patient was continuously monitored during the procedure by the
interventional radiology nurse under my direct supervision.

FLUOROSCOPY TIME:  18 seconds, 2.7 mGy

COMPLICATIONS:
None immediate.

PROCEDURE:
The procedure, risks, benefits, and alternatives were explained to
the patient. Questions regarding the procedure were encouraged and
answered. The patient understands and consents to the procedure.

Patient was placed supine on the interventional table. Ultrasound
confirmed a patent right internal jugular vein. The right chest and
neck were cleaned with a skin antiseptic and a sterile drape was
placed. Maximal barrier sterile technique was utilized including
caps, mask, sterile gowns, sterile gloves, sterile drape, hand
hygiene and skin antiseptic. The right neck was anesthetized with 1%
lidocaine. Small incision was made in the right neck with a blade.
Micropuncture set was placed in the right internal jugular vein with
ultrasound guidance. The micropuncture wire was used for measurement
purposes. The right chest was anesthetized with 1% lidocaine with
epinephrine. #15 blade was used to make an incision and a
subcutaneous port pocket was formed. 8 french Power Port was
assembled. Subcutaneous tunnel was formed with a stiff tunneling
device. The port catheter was brought through the subcutaneous
tunnel. The port was placed in the subcutaneous pocket. The
micropuncture set was exchanged for a peel-away sheath. The catheter
was placed through the peel-away sheath and the tip was positioned
at the superior cavoatrial junction. Catheter placement was
confirmed with fluoroscopy. The port was accessed and flushed with
heparinized saline. The port pocket was closed using two layers of
absorbable sutures and Dermabond. The vein skin site was closed
using a single layer of absorbable suture and Dermabond. Sterile
dressings were applied. Patient tolerated the procedure well without
an immediate complication. Ultrasound and fluoroscopic images were
taken and saved for this procedure.
IMPRESSION: Placement of a subcutaneous port device.

## 2017-09-18 NOTE — Telephone Encounter (Signed)
Called patient regarding April 2019

## 2017-09-18 NOTE — Telephone Encounter (Signed)
No needs -nurse called her back.

## 2017-10-01 ENCOUNTER — Telehealth: Payer: Self-pay | Admitting: Internal Medicine

## 2017-10-01 ENCOUNTER — Telehealth: Payer: Self-pay | Admitting: Family Medicine

## 2017-10-01 NOTE — Telephone Encounter (Signed)
Message left that request has to be approved by provider. Will forward request.

## 2017-10-01 NOTE — Telephone Encounter (Signed)
[facility] re: RX for [place]  Patient has coupon for [employer] but not for [place]. The pharmacy does not have RX for [place] (they only have RX for [place]) Please [facility] ph# [phone]

## 2017-10-01 NOTE — Telephone Encounter (Signed)
Spoke with pharmacist and let them know that in 04/2017 it was discontinued because it is not covered by her insurance.

## 2017-10-01 NOTE — Telephone Encounter (Signed)
Copied from [place] [phone]. Topic: Quick Communication - See Telephone Encounter >> Oct 01, 2017 10:15 AM Burnis Medin, NT wrote: CRM for notification. See Telephone encounter for: Pt daughter in law is calling because she is at the pharmacy and she needs her prescription changed from Insulin Glargine (LANTUS SOLOSTAR) 100 UNIT/ML Solostar Pen to [employer] so she can use her coupon that expires today. [facility] [address] She would like a call back  10/01/17.

## 2017-10-03 NOTE — Telephone Encounter (Signed)
See note

## 2017-10-03 NOTE — Telephone Encounter (Signed)
Called and confirmed with patient that she picked up her Lantus from the pharmacy on Monday. She denies needing any refills currently.

## 2017-10-10 ENCOUNTER — Ambulatory Visit (INDEPENDENT_AMBULATORY_CARE_PROVIDER_SITE_OTHER): Payer: Medicare Other | Admitting: Family Medicine

## 2017-10-10 ENCOUNTER — Encounter: Payer: Self-pay | Admitting: Family Medicine

## 2017-10-10 VITALS — BP 130/68 | HR 76 | Temp 97.4°F | Ht 62.5 in | Wt 179.8 lb

## 2017-10-10 DIAGNOSIS — Z794 Long term (current) use of insulin: Secondary | ICD-10-CM | POA: Diagnosis not present

## 2017-10-10 DIAGNOSIS — I5032 Chronic diastolic (congestive) heart failure: Secondary | ICD-10-CM

## 2017-10-10 DIAGNOSIS — F17200 Nicotine dependence, unspecified, uncomplicated: Secondary | ICD-10-CM | POA: Diagnosis not present

## 2017-10-10 DIAGNOSIS — Z170 Estrogen receptor positive status [ER+]: Secondary | ICD-10-CM | POA: Diagnosis not present

## 2017-10-10 DIAGNOSIS — K625 Hemorrhage of anus and rectum: Secondary | ICD-10-CM | POA: Diagnosis not present

## 2017-10-10 DIAGNOSIS — I10 Essential (primary) hypertension: Secondary | ICD-10-CM | POA: Diagnosis not present

## 2017-10-10 DIAGNOSIS — C50412 Malignant neoplasm of upper-outer quadrant of left female breast: Secondary | ICD-10-CM | POA: Diagnosis not present

## 2017-10-10 DIAGNOSIS — N183 Chronic kidney disease, stage 3 unspecified: Secondary | ICD-10-CM

## 2017-10-10 DIAGNOSIS — E785 Hyperlipidemia, unspecified: Secondary | ICD-10-CM

## 2017-10-10 DIAGNOSIS — E113419 Type 2 diabetes mellitus with severe nonproliferative diabetic retinopathy with macular edema, unspecified eye: Secondary | ICD-10-CM

## 2017-10-10 DIAGNOSIS — E1165 Type 2 diabetes mellitus with hyperglycemia: Secondary | ICD-10-CM

## 2017-10-10 DIAGNOSIS — IMO0002 Reserved for concepts with insufficient information to code with codable children: Secondary | ICD-10-CM

## 2017-10-10 LAB — COMPREHENSIVE METABOLIC PANEL
ALT: 5 U/L (ref 0–35)
AST: 8 U/L (ref 0–37)
Albumin: 4.3 g/dL (ref 3.5–5.2)
Alkaline Phosphatase: 82 U/L (ref 39–117)
BUN: 21 mg/dL (ref 6–23)
CO2: 33 meq/L — AB (ref 19–32)
Calcium: 9.7 mg/dL (ref 8.4–10.5)
Chloride: 99 mEq/L (ref 96–112)
Creatinine, Ser: 1.07 mg/dL (ref 0.40–1.20)
GFR: 64.07 mL/min (ref >60.00)
GLUCOSE: 153 mg/dL — AB (ref 70–99)
POTASSIUM: 4.3 meq/L (ref 3.5–5.1)
Sodium: 140 mEq/L (ref 135–145)
Total Bilirubin: 0.4 mg/dL (ref 0.2–1.2)
Total Protein: 7.2 g/dL (ref 6.0–8.3)

## 2017-10-10 LAB — LIPID PANEL
CHOL/HDL RATIO: 3
Cholesterol: 206 mg/dL — ABNORMAL HIGH (ref 0–200)
HDL: 65.2 mg/dL (ref >39.00)
LDL Cholesterol: 119 mg/dL — ABNORMAL HIGH (ref 0–99)
NonHDL: 141.29
Triglycerides: 113 mg/dL (ref 0.0–149.0)
VLDL: 22.6 mg/dL (ref 0.0–40.0)

## 2017-10-10 LAB — MICROALBUMIN / CREATININE URINE RATIO
CREATININE, U: 102.9 mg/dL
MICROALB UR: 36.9 mg/dL — AB (ref 0.0–1.9)
MICROALB/CREAT RATIO: 35.9 mg/g — AB (ref 0.0–30.0)

## 2017-10-10 LAB — CBC
HCT: 36.1 % (ref 36.0–46.0)
HEMOGLOBIN: 12.1 g/dL (ref 12.0–15.0)
MCHC: 33.5 g/dL (ref 30.0–36.0)
MCV: 86.6 fl (ref 78.0–100.0)
Platelets: 289 10*3/uL (ref 150.0–400.0)
RBC: 4.16 Mil/uL (ref 3.87–5.11)
RDW: 16.4 % — AB (ref 11.5–15.5)
WBC: 6 10*3/uL (ref 4.0–10.5)

## 2017-10-10 MED ORDER — LISINOPRIL-HYDROCHLOROTHIAZIDE 10-12.5 MG PO TABS: 1.0000 | ORAL_TABLET | Freq: Every day | ORAL | 3 refills | Status: DC
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
## 2017-10-10 NOTE — Assessment & Plan Note (Signed)
Remains on femara. She is doing well and has oncology follow up.

## 2017-10-10 NOTE — Addendum Note (Signed)
Addended by: Marin Olp on: 10/10/2017 04:30 PM   Modules accepted: Orders

## 2017-10-10 NOTE — Assessment & Plan Note (Signed)
S: controls edema usually with watching salt and being compliant with HCTZ. Continued DOE with walking around [place] but is able to get into the office without issue.  A/P: appears euvolemic- could add lasix in future if needed

## 2017-10-10 NOTE — Assessment & Plan Note (Signed)
Update BMET- needs BP (controlled), lipid (controlled), Diabetes (working on this) control. Should avoid nsaids

## 2017-10-10 NOTE — Assessment & Plan Note (Addendum)
S: reasonably controlled on crestor 5mg  but could not afford medicine. No myalgias.  A/P: update lipids today  Addendum: on lovastatin 40mg  she has poor control but this is tolerable for her myalgias Lab Results  Component Value Date   CHOL 206 (H) 10/10/2017   HDL 65.20 10/10/2017   LDLCALC 119 (H) 10/10/2017   LDLDIRECT 91 10/25/2012   TRIG 113.0 10/10/2017   CHOLHDL 3 10/10/2017
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
## 2017-10-10 NOTE — Assessment & Plan Note (Signed)
With Chew , not ready to quit- trying to cut back

## 2017-10-10 NOTE — Patient Instructions (Addendum)
Call to reschedule colonoscopy as well as eye exam  So sorry Jeneen Rinks isnt doing well- tell him I said hello  Please stop by lab before you go

## 2017-10-10 NOTE — Assessment & Plan Note (Addendum)
S: controlled on hctz 25mg , amlodipine 10mg , coreg 25mg  BID, clonidine 0.3 mg BID. No longer on lisinopril (would be renal protective).  BP Readings from Last 3 Encounters:  10/10/17 130/68  09/12/17 (!) 105/58  09/05/17 132/62  A/P: We discussed blood pressure goal of <140/90. Continue current meds  Addendum: microalbumin/cr ratio elevated so stop hctz and add lisinopril- hctz 10-12.5mg 

## 2017-10-11 ENCOUNTER — Other Ambulatory Visit: Payer: Medicare Other

## 2017-10-11 ENCOUNTER — Ambulatory Visit: Payer: Medicare Other | Admitting: Hematology

## 2017-10-12 ENCOUNTER — Telehealth: Payer: Self-pay

## 2017-10-12 ENCOUNTER — Ambulatory Visit: Payer: Medicare Other | Admitting: Hematology

## 2017-10-12 ENCOUNTER — Other Ambulatory Visit: Payer: Medicare Other

## 2017-10-12 MED ORDER — INSULIN ASPART 100 UNIT/ML FLEXPEN: PEN_INJECTOR | SUBCUTANEOUS | 0 refills | Status: DC

## 2017-10-12 NOTE — Telephone Encounter (Signed)
Yes, no pb! 

## 2017-10-12 NOTE — Telephone Encounter (Signed)
Sent to pharmacy 

## 2017-10-12 NOTE — Telephone Encounter (Signed)
Wendy [name] is not covered, can we send Novolog per pharmacy's request?

## 2017-10-25 ENCOUNTER — Telehealth: Payer: Self-pay | Admitting: Family Medicine

## 2017-10-25 NOTE — Telephone Encounter (Signed)
Copied from [place] [phone]. Topic: Quick Communication - See Telephone Encounter >> Oct 25, 2017  3:54 PM Synthia Innocent wrote: CRM for notification. See Telephone encounter for:  Insurance will not cover humalog and novolog. Can anything else be called in ? Patient will be out tomorrow. 10/25/17.

## 2017-10-25 NOTE — Telephone Encounter (Signed)
See note

## 2017-10-25 NOTE — Telephone Encounter (Signed)
What does her insurance cover then- any short acting insulins? Do we want to get her some samples until next week so she can call and find out?

## 2017-10-26 ENCOUNTER — Telehealth: Payer: Self-pay

## 2017-10-26 NOTE — Telephone Encounter (Signed)
Called and spoke to patient and let her know that we have a sample here we can provide her of the Novolog. She stated she will come by the office and pick it up this afternoon. She is calling her insurance company to see what they cover. I also provided her the telephone number for the [employer]. She has been contact with them and they are sending her a list of companies that may help her to afford her medications

## 2017-10-26 NOTE — Telephone Encounter (Signed)
I would advise office visit with patient and her daughter.

## 2017-10-26 NOTE — Telephone Encounter (Signed)
See note

## 2017-10-26 NOTE — Telephone Encounter (Signed)
Medication Samples have been provided to the patient.  Drug name: Novolog      Strength: 100U/ml        Qty: 1  LOT: [id number]  Exp.Date: 01/2018  Dosing instructions: Inject 6-10 units 3 times a day  The patient has been instructed regarding the correct time, dose, and frequency of taking this medication, including desired effects and most common side effects.   Wendy [name] 5:10 PM 10/26/2017

## 2017-10-26 NOTE — Telephone Encounter (Signed)
Called and left a voicemail message asking for a return phone call to schedule her mother in law

## 2017-10-26 NOTE — Telephone Encounter (Signed)
Daughter in law came by office and picked up sample. She lives with patient. She will bring patient to scheduled appointment on Wednesday next week.

## 2017-10-26 NOTE — Telephone Encounter (Signed)
Daughter in law called - she [facility]  Family has contacted [facility], they put [facility] in contact [facility].  Its the only place in [place] that she can afford her insulin for each month.  She would have to transfer care to them. She can not continue to see dr hunter and get her medicine [facility]. Pt and her family want to stay with Dr Yong Channel.   There is no long term solution.    Daughter in law is asking if there is a generic or different insulin to switch to.   She states that pt is not completely understanding what is said during conversations, daughter in law would like call as well as pt So she can clarify   Cb# [id number]

## 2017-10-30 ENCOUNTER — Ambulatory Visit: Payer: Medicare Other | Admitting: Internal Medicine

## 2017-10-30 DIAGNOSIS — Z0289 Encounter for other administrative examinations: Secondary | ICD-10-CM

## 2017-10-30 NOTE — Progress Notes (Deleted)
Patient ID: Wendy [name], female   DOB: 06/25/1941, 77 y.o.   MRN: [id number]  HPI: Wendy [name] is a 77 y.o.-year-old female, returning for f/u for DM2, dx in 3s, insulin-dependent since 1990, uncontrolled, with complications (CKD, DR). Last visit 3 months ago.  Of note, she has breast cancer and had left breast lumpectomy + chemotherapy for 4 weeks + radiotherapy.  She finished all the treatments.   Last hemoglobin A1c was: Lab Results  Component Value Date   HGBA1C 8.4 07/26/2017   HGBA1C 7.4 04/25/2017   HGBA1C 8.2 01/08/2017    Pt is on: - Metformin 1000 mg with dinner - Basaglar 24 units in a.m. - Humalog 15 min before every meal: - 6 units before a smaller meal - 8 units before a regular meal - 10units before a large meal or if you have dessert Try to skip the snack at night, if sugars >100.  Pt checks her sugars 3 times a day-no log, no meter: - am:  1114, 189-236 >> 125-226 >> 70, 170-200s, 270 - 2h after b'fast: n/c >> 116-270 >> n/c - before lunch: 70-215 >> 128-171, 235 >> 140s - 2h after lunch: 200, 203 >> n/c >> 269 >> n/c - before dinner: 194 >> 209 >> 210, 244 >> 200s - 2h after dinner: 174, 302 >> 120-174 >> 135 >> n/c - bedtime: 200 >> n/c  >> 198, 322 >> 200s - nighttime: 1244-290 >> n/c >> 73 x1, 130 >> n/c Lowest sugar was 32 >> ... 52s >> ***; she has hypoglycemia awareness in the 49s.  Highest sugar was 300 >> ***.  Glucometer: [employer]  Pt's meals are: - Breakfast: toast, egg, bacon/sausage; oatmeal - Lunch: [place] sandwich, soups, crackers - Dinner: baked chicken, potatoes, cabbage (dinner is her largest meal) - Snacks: PB and crackers  -+ Mild CKD, last BUN/creatinine:  Lab Results  Component Value Date   BUN 21 10/10/2017   CREATININE 1.07 10/10/2017  On lisinopril. -+ HL; last set of lipids: Lab Results  Component Value Date   CHOL 206 (H) 10/10/2017   HDL 65.20 10/10/2017   LDLCALC 119 (H) 10/10/2017   LDLDIRECT 91 10/25/2012    TRIG 113.0 10/10/2017   CHOLHDL 3 10/10/2017  Continues on Crestor 5. 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  She had laser surgery.Usually sees Dr. Kathlen Mody. -+ Neuropathic pain in feet On ASA 81.  She is a caregiver for her husband, who is in a wheelchair.   ROS: Constitutional: no weight gain/no weight loss, no fatigue, no subjective hyperthermia, no subjective hypothermia Eyes: no blurry vision, no xerophthalmia ENT: no sore throat, no nodules palpated in throat, no dysphagia, no odynophagia, no hoarseness Cardiovascular: no CP/no SOB/no palpitations/no leg swelling Respiratory: no cough/no SOB/no wheezing Gastrointestinal: no N/no V/no D/no C/no acid reflux Musculoskeletal: no muscle aches/no joint aches Skin: no rashes, no hair loss Neurological: no tremors/no numbness/no tingling/+ neuropathic pain in feet/no dizziness  I reviewed pt's medications, allergies, PMH, social hx, family hx, and changes were documented in the history of present illness. Otherwise, unchanged from my initial visit note.  Past Medical History:  Diagnosis Date  . Abnormal breast finding 2018   per pt/ having  a lot drainage from left breast nipple  . Breast cancer ([place]) 11/2016   left/  . GERD (gastroesophageal reflux disease)    TUMS as needed  . HTN (hypertension)    states BP has been high  recently; has been on med. x 20 yr.  . Hyperlipidemia   . Insulin dependent diabetes mellitus ([place])   . Personal history of chemotherapy    2018/finished 6 weeks of chemo in Sep 2018  . Personal history of radiation therapy    2018 left breast/finished radiation in Sept 2018 per pt.   Past Surgical History:  Procedure Laterality Date  . BREAST LUMPECTOMY Left 05/05/2008  . BREAST LUMPECTOMY Left 12/07/2016   malignant  . BREAST LUMPECTOMY WITH RADIOACTIVE SEED AND SENTINEL LYMPH NODE BIOPSY Left 12/07/2016   Procedure: LEFT BREAST LUMPECTOMY WITH RADIOACTIVE SEED AND  SENTINEL LYMPH NODE BIOPSY;  Surgeon: Erroll Luna, MD;  Location: [facility];  Service: General;  Laterality: Left;  . IR FLUORO GUIDE PORT INSERTION RIGHT  01/04/2017  . IR US GUIDE VASC ACCESS RIGHT  01/04/2017  . PORT-A-CATH REMOVAL N/A 05/30/2017   Procedure: REMOVAL PORT-A-CATH;  Surgeon: Erroll Luna, MD;  Location: [facility];  Service: General;  Laterality: N/A;  . RE-EXCISION OF BREAST LUMPECTOMY Left 12/28/2016   Procedure: RE-EXCISION OF BREAST LUMPECTOMY;  Surgeon: Erroll Luna, MD;  Location: [facility];  Service: General;  Laterality: Left;   Social History   Social History  . Marital Status: Married    Spouse Name: N/A  . Number of Children: 4   . retired    Social History Main Topics  . Smoking status: Never Smoker   . Smokeless tobacco: Former Systems developer    Types: Snuff    Quit date: 10/02/2014     Comment: Quit 10/02/2014  previous 1 box per week of snuff   . Alcohol Use: No  . Drug Use: No   Current Outpatient Medications on File Prior to Visit  Medication Sig Dispense Refill  . amLODipine (NORVASC) 10 MG tablet TAKE 1 TABLET (10 MG TOTAL) BY MOUTH DAILY. 90 tablet 1  . aspirin 81 MG tablet Take 81 mg by mouth daily.      . B-D INS SYRINGE 0.5CC/31GX5/16 31G X 5/16" 0.5 ML MISC USE AS DIRECTED 4 TIMES A DAY 300 each 1  . carvedilol (COREG) 25 MG tablet TAKE 1 TABLET BY MOUTH TWICE A DAY WITH A MEAL 180 tablet 1  . cloNIDine (CATAPRES) 0.3 MG tablet Take 0.3 mg by mouth 2 (two) times daily.    . insulin aspart (NOVOLOG FLEXPEN) 100 UNIT/ML FlexPen Inject 6-10 units 3 times a day 15 mL 0  . Insulin Glargine (LANTUS SOLOSTAR) 100 UNIT/ML Solostar Pen Inject 24 units into the skin 10 pen 5  . Insulin Pen Needle 31G X 5 MM MISC Use pen needles for insulin injection daily 100 each 3  . Insulin Syringe-Needle U-100 (B-D INS SYRINGE 0.5CC/30GX1/2") 30G X 1/2" 0.5 ML MISC USE AS DIRECTED 4 TIMES A DAY 300 each 3  . letrozole (FEMARA) 2.5 MG tablet  Take 1 tablet (2.5 mg total) by mouth daily. 30 tablet 2  . lisinopril-hydrochlorothiazide (PRINZIDE,ZESTORETIC) 10-12.5 MG tablet Take 1 tablet by mouth daily. 90 tablet 3  . lovastatin (MEVACOR) 40 MG tablet Take 1 tablet (40 mg total) by mouth at bedtime. 90 tablet 3  . metFORMIN (GLUCOPHAGE) 500 MG tablet TAKE 2 TABLETS BY MOUTH DAILY WITH SUPPER. 180 tablet 0  . Na Sulfate-K Sulfate-Mg Sulf 17.5-3.13-1.6 GM/177ML SOLN Take 1 kit by mouth as directed. 354 mL 0  . OVER THE COUNTER MEDICATION Take 1 tablet by mouth daily as needed. Stool softner for constipation, also drinks warm prune juice  prn     No current facility-administered medications on file prior to visit.    No Known Allergies Family History  Problem Relation Age of Onset  . Diabetes Sister        x 4  . Diabetes Brother   . Diabetes Paternal Grandmother   . Diabetes Paternal Aunt   . Heart disease Brother   . Heart disease Sister   . Emphysema Brother   . Cancer Father 52       brain tumor   . Colon cancer Neg Hx   . Stomach cancer Neg Hx    PE: There were no vitals taken for this visit. There is no height or weight on file to calculate BMI. Wt Readings from Last 3 Encounters:  10/10/17 179 lb 12.8 oz (81.6 kg)  09/12/17 174 lb 14.4 oz (79.3 kg)  09/05/17 176 lb (79.8 kg)   Constitutional: overweight, in NAD Eyes: PERRLA, EOMI, no exophthalmos ENT: moist mucous membranes, no thyromegaly, no cervical lymphadenopathy Cardiovascular: RRR, No MRG Respiratory: CTA B Gastrointestinal: abdomen soft, NT, ND, BS+ Musculoskeletal: no deformities, strength intact in all 4 Skin: moist, warm, no rashes Neurological: no tremor with outstretched hands, DTR normal in all 4  ASSESSMENT: 1. DM2, insulin-dependent, uncontrolled, with complications - + DR (severe, non-proliferative)  2. HL  PLAN:  1.  Patient with long-standing, uncontrolled, diabetes, on basal-bolus insulin regimen and metformin with higher sugars at  last 2 visits due to not taking her mealtime insulin as prescribed.  At last visit, I advised her to take this before every meal.  We also increase her Humalog before a regular meal to 8 units.  - I suggested to:  Patient Instructions  Please continue: - Metformin 1000 mg with dinner - Basaglar 24 units in a.m. - Humalog 15 min before every meal: - 6 units before a smaller meal - 8 units before a regular meal - 10units before a large meal or if you have dessert Try to skip the snack at night, if sugars >100.  Please return in 3 months with your sugar log.   - today, HbA1c is 7%  - continue checking sugars at different times of the day - check 3x a day, rotating checks - advised for yearly eye exams >> she is UTD - UTD with flu shot - Return to clinic in 3 mo with sugar log   2. HL -Reviewed latest lipid panel: LDL higher -She continues on Crestor 5 mg daily-no side effects  Philemon Kingdom, MD PhD [facility] Endocrinology

## 2017-10-31 ENCOUNTER — Ambulatory Visit: Payer: Medicare Other | Admitting: Family Medicine

## 2017-11-08 ENCOUNTER — Other Ambulatory Visit: Payer: Self-pay | Admitting: Internal Medicine

## 2017-11-08 ENCOUNTER — Telehealth: Payer: Self-pay | Admitting: Family Medicine

## 2017-11-08 ENCOUNTER — Encounter: Payer: Self-pay | Admitting: Family Medicine

## 2017-11-08 ENCOUNTER — Ambulatory Visit (INDEPENDENT_AMBULATORY_CARE_PROVIDER_SITE_OTHER): Payer: Medicare Other | Admitting: Family Medicine

## 2017-11-08 VITALS — BP 140/70 | HR 82 | Temp 98.1°F | Ht 62.5 in | Wt 177.0 lb

## 2017-11-08 DIAGNOSIS — I10 Essential (primary) hypertension: Secondary | ICD-10-CM

## 2017-11-08 DIAGNOSIS — Z794 Long term (current) use of insulin: Secondary | ICD-10-CM | POA: Diagnosis not present

## 2017-11-08 DIAGNOSIS — E113419 Type 2 diabetes mellitus with severe nonproliferative diabetic retinopathy with macular edema, unspecified eye: Secondary | ICD-10-CM | POA: Diagnosis not present

## 2017-11-08 DIAGNOSIS — E1165 Type 2 diabetes mellitus with hyperglycemia: Secondary | ICD-10-CM | POA: Diagnosis not present

## 2017-11-08 DIAGNOSIS — IMO0002 Reserved for concepts with insufficient information to code with codable children: Secondary | ICD-10-CM

## 2017-11-08 MED ORDER — INSULIN REGULAR HUMAN 100 UNIT/ML IJ SOLN: 5.0000 [IU] | Freq: Three times a day (TID) | INTRAMUSCULAR | 11 refills | Status: DC

## 2017-11-08 NOTE — Assessment & Plan Note (Addendum)
S: controlled mildly poorly today on Lisinopril hctz 10-12.5mg , amlodipine 10mg , coreg 25mg  BID, clonidine 0.3 mg BID. BP Readings from Last 3 Encounters:  11/08/17 140/70  10/10/17 130/68  09/12/17 (!) 105/58  A/P: We discussed blood pressure goal of <140/90. Continue current meds:  Controlled last 2 visits so will monitor only for now

## 2017-11-08 NOTE — Assessment & Plan Note (Addendum)
S: poorly controlled on last check with a1c 8.4. She was on metformin 1g with dinner, lantus 24 units in AM, novolog 6-10 units TID (smaller if had small meal)  Unfortunately for some reason she reports humalong and novolog have become prohibatively expensive- up to $400 per prescription. Her lantus is $97 and she is able to afford that at present.   She states blood sugars usually in 200-250 range though if she misses a meal she has gotten down to 100 (once down to 70) Lab Results  Component Value Date   HGBA1C 8.4 07/26/2017   HGBA1C 7.4 04/25/2017   HGBA1C 8.2 01/08/2017    A/P: Patient simply cannot afford humalog or novolog- we gave her samples to get to today. She states she has insulin syringes at home that she can use.  At this point will give her regular insulin and lower dose slightly and encourage follow up with Dr. Cruzita Lederer within a month. She agrees to call to get this set up.   From avs "Please continue: - Metformin 1000 mg with dinner - Lantus 24 units in am  Please start taking regular insulin before EVERY meal + please eat 3x a day at least: - Regular insulin 15-30 min before meals: - 5 units before a smaller meal - 7 units before a regular meal - 9 units before a large meal or if you have dessert Try to skip the snack at night, if sugars >100.  Please go ahead and schedule a visit with Dr. Cruzita Lederer in 1 month- also please cancel your visit with Dr. Quay Burow on 12/18/17"

## 2017-11-08 NOTE — Progress Notes (Signed)
Subjective:  Wendy [name] is a 77 y.o. year old very pleasant female patient who presents for/with See problem oriented charting ROS- No chest pain or shortness of breath. No headache or blurry vision. Some right ankle pain after sprain in last few months but largely improved   Past Medical History-  Patient Active Problem List   Diagnosis Date Noted  . Malignant neoplasm of upper-outer quadrant of left breast in female, estrogen receptor positive ([place]) 11/20/2016    Priority: High  . 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 Chronic diastolic heart failure ([place]) 07/06/2011    Priority: High  . Rectal bleeding 01/17/2017    Priority: Medium  . CKD (chronic kidney disease) stage 3, GFR 30-59 ml/min (HCC) 03/17/2016    Priority: Medium  . Osteopenia 01/05/2016    Priority: Medium  . TOBACCO USE 04/13/2008    Priority: Medium  . Hyperlipidemia 11/29/2006    Priority: Medium  . OBESITY, BMI 30-35 11/29/2006    Priority: Medium  . HYPERTENSION, BENIGN SYSTEMIC 11/29/2006    Priority: Medium  . Fatigue 03/14/2017    Priority: Low  . Edema 03/14/2017    Priority: Low  . Port catheter in place 01/18/2017    Priority: Low  . Constipation 01/17/2017    Priority: Low  . Seasonal allergies 12/01/2013    Priority: Low    Medications- reviewed and updated Current Outpatient Medications  Medication Sig Dispense Refill  . amLODipine (NORVASC) 10 MG tablet TAKE 1 TABLET (10 MG TOTAL) BY MOUTH DAILY. 90 tablet 1  . aspirin 81 MG tablet Take 81 mg by mouth daily.      . B-D INS SYRINGE 0.5CC/31GX5/16 31G X 5/16" 0.5 ML MISC USE AS DIRECTED 4 TIMES A DAY 300 each 1  . carvedilol (COREG) 25 MG tablet TAKE 1 TABLET BY MOUTH TWICE A DAY WITH A MEAL 180 tablet 0  . cloNIDine (CATAPRES) 0.3 MG tablet Take 0.3 mg by mouth 2 (two) times daily.    . insulin aspart  (NOVOLOG FLEXPEN) 100 UNIT/ML FlexPen Inject 6-10 units 3 times a day 15 mL 0  . Insulin Glargine (LANTUS SOLOSTAR) 100 UNIT/ML Solostar Pen Inject 24 units into the skin 10 pen 5  . Insulin Pen Needle 31G X 5 MM MISC Use pen needles for insulin injection daily 100 each 3  . Insulin Syringe-Needle U-100 (B-D INS SYRINGE 0.5CC/30GX1/2") 30G X 1/2" 0.5 ML MISC USE AS DIRECTED 4 TIMES A DAY 300 each 3  . letrozole (FEMARA) 2.5 MG tablet Take 1 tablet (2.5 mg total) by mouth daily. 30 tablet 2  . lisinopril-hydrochlorothiazide (PRINZIDE,ZESTORETIC) 10-12.5 MG tablet Take 1 tablet by mouth daily. 90 tablet 3  . lovastatin (MEVACOR) 40 MG tablet Take 1 tablet (40 mg total) by mouth at bedtime. 90 tablet 3  . metFORMIN (GLUCOPHAGE) 500 MG tablet TAKE 2 TABLETS BY MOUTH DAILY WITH SUPPER. 180 tablet 0  . Na Sulfate-K Sulfate-Mg Sulf 17.5-3.13-1.6 GM/177ML SOLN Take 1 kit by mouth as directed. 354 mL 0  . OVER THE COUNTER MEDICATION Take 1 tablet by mouth daily as needed. Stool softner for constipation, also drinks warm prune juice prn     No current facility-administered medications for this visit.     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 She was on metformin 1g with dinner, lantus 24 units in AM, novolog 6-10 units TID (smaller if had small meal)  Unfortunately for some reason she reports humalong and novolog have become prohibatively expensive- up to  $400 per prescription. Her lantus is $97 and she is able to afford that at present.   She states blood sugars usually in 200-250 range though if she misses a meal she has gotten down to 100 (once down to 70) Lab Results  Component Value Date   HGBA1C 8.4 07/26/2017   HGBA1C 7.4 04/25/2017   HGBA1C 8.2 01/08/2017    A/P: Patient simply cannot afford humalog or novolog- we gave her samples to get to today. She states she has insulin syringes at home that she can use.  At this point will give her regular insulin and lower dose slightly and encourage follow up with Dr. Cruzita Lederer within a month. She agrees to call to get this set up.   From avs "Please continue: - Metformin 1000 mg with dinner - Lantus 24 units in am  Please start taking regular insulin before EVERY meal + please eat 3x a day at least: - Regular insulin 15-30 min before meals: - 5 units before a smaller meal - 7 units before a regular meal - 9 units before a large meal or if you have dessert Try to skip the snack at night, if sugars >100.  Please go ahead and schedule a visit with Dr. Cruzita Lederer in 1 month- also please cancel your visit with Dr. Quay Burow on 12/18/17"  HYPERTENSION, BENIGN SYSTEMIC S: controlled mildly poorly today on Lisinopril hctz 10-12.65m, amlodipine 127m coreg 2552mID, clonidine 0.3 mg BID. BP Readings from Last 3 Encounters:  11/08/17 140/70  10/10/17 130/68  09/12/17 (!) 105/58  A/P: We discussed blood pressure goal of <140/90. Continue current meds:  Controlled last 2 visits so will monitor only for now   Future Appointments  Date Time Provider DepBradner/19/2019  1:30 PM BurBinnie RailD [facility]  01/09/2018 10:15 AM [facility] LAB 1 [facility] None  01/09/2018 10:45 AM FenTruitt MerleD [facility] None  02/07/2018 10:15 AM HunMarin OlpD [facility]  she was to cancel visit with Dr. BurQuay Burowfore she left. Has follow up with me in may.   Meds ordered this encounter   Medications  . insulin regular (NOVOLIN R,HUMULIN R) 100 units/mL injection    Sig: Inject 0.05-0.1 mLs (5-10 Units total) into the skin 3 (three) times daily before meals. Relion. Please provide insulin syringes needed    Dispense:  10 mL    Refill:  11    Return precautions advised.  SteGarret ReddishD

## 2017-11-08 NOTE — Telephone Encounter (Signed)
Pt's daughter in law, [name], called requesting clarification of medications: 1.) is carvedilol a pill? And 2.) is the pt supposed to take lantus?; reviewed after visit summary, per Dr Yong Channel dated 11/08/17, with [name];    " Please continue: - Metformin 1000 mg with dinner - Lantus 24 units in am  Please start taking Humalog before EVERY meal + please eat 3x a day at least: - Regular insulin 15-30 min before meals: - 5 units before a smaller meal - 7 units before a regular meal - 9 units before a large meal or if you have dessert Try to skip the snack at night, if sugars >100."  Also verified that corvedilol "TAKE 1 TABLET BY MOUTH TWICE A DAY WITH A MEAL"; she verbalizes understanding and states that she will call back if she has any questions.

## 2017-11-08 NOTE — Patient Instructions (Addendum)
"  Please continue: - Metformin 1000 mg with dinner - Lantus 24 units in am  Please start taking regular insulin before EVERY meal + please eat 3x a day at least: - Regular insulin 15-30 min before meals: - 5 units before a smaller meal - 7 units before a regular meal - 9 units before a large meal or if you have dessert Try to skip the snack at night, if sugars >100.  Please go ahead and schedule a visit with Dr. Cruzita Lederer in 1 month- also please cancel your visit with Dr. Quay Burow on 12/18/17"

## 2017-11-15 ENCOUNTER — Telehealth: Payer: Self-pay

## 2017-11-15 NOTE — Telephone Encounter (Signed)
-----   Message from Marin Olp, MD sent at 11/08/2017  8:31 PM EST ----- Please inform patient to have them run the regular insulin without insurance per Dr. Cruzita Lederer- should be cheaper  Garret Reddish  ----- Message ----- From: Philemon Kingdom, MD Sent: 11/08/2017   2:55 PM To: Marin Olp, MD  Annie Main, We can definitely work with regular insulin from [facility], she needs to run it without insurance. Thank you for doing this! Salena Saner ----- Message ----- From: Marin Olp, MD Sent: 11/08/2017  12:43 PM To: Philemon Kingdom, MD  For Mrs. Deam,  Lantus is affordable at around $100 but for some reason novolog and humalog are now $400 per her and her daughter. Her sugars are usually 200-250 including fasting and nonfasting (didn't bring log) but goes into 70-100 range if misses meal. I encouraged her to see you in 1 month. I reduced dose slightly and sent in regular insulin from relion for her. Hope this is ok- its my short term bandaid until she can see you.  Garret Reddish

## 2017-11-15 NOTE — Telephone Encounter (Signed)
Spoke with patient's daughter in law [place] who verbalized understanding

## 2017-11-29 ENCOUNTER — Ambulatory Visit (INDEPENDENT_AMBULATORY_CARE_PROVIDER_SITE_OTHER): Payer: Medicare Other | Admitting: Internal Medicine

## 2017-11-29 ENCOUNTER — Encounter: Payer: Self-pay | Admitting: Internal Medicine

## 2017-11-29 VITALS — BP 100/64 | HR 70 | Temp 97.9°F | Resp 16 | Ht 62.5 in | Wt 180.0 lb

## 2017-11-29 DIAGNOSIS — Z794 Long term (current) use of insulin: Secondary | ICD-10-CM

## 2017-11-29 DIAGNOSIS — E113419 Type 2 diabetes mellitus with severe nonproliferative diabetic retinopathy with macular edema, unspecified eye: Secondary | ICD-10-CM | POA: Diagnosis not present

## 2017-11-29 DIAGNOSIS — E1165 Type 2 diabetes mellitus with hyperglycemia: Secondary | ICD-10-CM | POA: Diagnosis not present

## 2017-11-29 DIAGNOSIS — E785 Hyperlipidemia, unspecified: Secondary | ICD-10-CM | POA: Diagnosis not present

## 2017-11-29 DIAGNOSIS — IMO0002 Reserved for concepts with insufficient information to code with codable children: Secondary | ICD-10-CM

## 2017-11-29 LAB — POCT GLYCOSYLATED HEMOGLOBIN (HGB A1C): HEMOGLOBIN A1C: 9.2

## 2017-11-29 NOTE — Progress Notes (Signed)
Patient ID: Wendy [name], female   DOB: 1940/10/04, 77 y.o.   MRN: [id number]  HPI: Wendy [name] is a 77 y.o.-year-old female, returning for f/u for DM2, dx in 80s, insulin-dependent since 1990, uncontrolled, with complications (CKD, DR). Last visit 4 months ago.  He lost her husband since last visit. He had dementia and CHF.  She has breast cancer and had left breast lumpectomy + chemotherapy for 4 weeks + radiotherapy.  She has now finished all the treatments.    Last hemoglobin A1c was: Lab Results  Component Value Date   HGBA1C 8.4 07/26/2017   HGBA1C 7.4 04/25/2017   HGBA1C 8.2 01/08/2017    Pt is on: - Metformin 1000 mg with dinner - Basaglar >> Lantus 24 units in a.m. - 149$ per box - Humalog >> R insulin: (skips mid-day dose as not home) - 6 units before a smaller meal - 8 units before a regular meal  (not taking) Try to skip the snack at night, if sugars >100.  Pt checks her sugars 3 times a day- no log, no meter: - am: 125-226 >> 70, 170-200s, 270 >> 120-187 (snack) - 2h after b'fast: n/c >> 116-270 >> n/c - before lunch: 128-171, 235 >> 140s >> 70 (forgot to eat, was active)-160 - 2h after lunch: 200, 203 >> n/c >> 269 >> n/c - before dinner: 209 >> 210, 244 >> 200s >> 200 - 2h after dinner: 174, 302 >> 120-174 >> 135 >> n/c - bedtime: 200 >> n/c  >> 198, 322 >> 200s >> 179-260 - nighttime: 1244-290 >> n/c >> 73 x1, 130 >> n/c Lowest sugar was 32 >> ... 70s >> 53; she has hypoglycemia awareness in the 24s.  Highest sugar was 300 >> 300s x 2.  Glucometer: [employer]  Pt's meals are: - Breakfast: toast, egg, bacon/sausage; oatmeal - Lunch: [place] sandwich, soups, crackers - Dinner: baked chicken, potatoes, cabbage (dinner is her largest meal) - Snacks: PB and crackers  - + MildCKD, last BUN/creatinine:  Lab Results  Component Value Date   BUN 21 10/10/2017   CREATININE 1.07 10/10/2017  On lisinopril 10. -+ HL; last set of lipids: Lab Results   Component Value Date   CHOL 206 (H) 10/10/2017   HDL 65.20 10/10/2017   LDLCALC 119 (H) 10/10/2017   LDLDIRECT 91 10/25/2012   TRIG 113.0 10/10/2017   CHOLHDL 3 10/10/2017  On Crestor 5 >> Lovastatin 40.. 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  She had laser surgery.  She usually sees Dr. Kathlen Mody.   -+ Neuropathic pain in feet On  ASA 81.  ROS: Constitutional: + Weight gain/no weight loss, no fatigue, no subjective hyperthermia, no subjective hypothermia, + nocturia Eyes: no blurry vision, no xerophthalmia ENT: no sore throat, no nodules palpated in throat, no dysphagia, no odynophagia, no hoarseness Cardiovascular: no CP/no SOB/no palpitations/+ leg swelling Respiratory: no cough/no SOB/no wheezing Gastrointestinal: no N/no V/+ D/+ C/no acid reflux Musculoskeletal: no muscle aches/no joint aches Skin: no rashes, no hair loss Neurological: no tremors/+ numbness/+ tingling/no dizziness  I reviewed pt's medications, allergies, PMH, social hx, family hx, and changes were documented in the history of present illness. Otherwise, unchanged from my initial visit note.  Past Medical History:  Diagnosis Date  . Abnormal breast finding 2018   per pt/ having  a lot drainage from left breast nipple  . Breast cancer ([place]) 11/2016   left/  . GERD (gastroesophageal reflux disease)  TUMS as needed  . HTN (hypertension)    states BP has been high recently; has been on med. x 20 yr.  . Hyperlipidemia   . Insulin dependent diabetes mellitus ([place])   . Personal history of chemotherapy    2018/finished 6 weeks of chemo in Sep 2018  . Personal history of radiation therapy    2018 left breast/finished radiation in Sept 2018 per pt.   Past Surgical History:  Procedure Laterality Date  . BREAST LUMPECTOMY Left 05/05/2008  . BREAST LUMPECTOMY Left 12/07/2016   malignant  . BREAST LUMPECTOMY WITH RADIOACTIVE SEED AND SENTINEL LYMPH NODE BIOPSY Left 12/07/2016    Procedure: LEFT BREAST LUMPECTOMY WITH RADIOACTIVE SEED AND SENTINEL LYMPH NODE BIOPSY;  Surgeon: Erroll Luna, MD;  Location: [facility];  Service: General;  Laterality: Left;  . IR FLUORO GUIDE PORT INSERTION RIGHT  01/04/2017  . IR US GUIDE VASC ACCESS RIGHT  01/04/2017  . PORT-A-CATH REMOVAL N/A 05/30/2017   Procedure: REMOVAL PORT-A-CATH;  Surgeon: Erroll Luna, MD;  Location: [facility];  Service: General;  Laterality: N/A;  . RE-EXCISION OF BREAST LUMPECTOMY Left 12/28/2016   Procedure: RE-EXCISION OF BREAST LUMPECTOMY;  Surgeon: Erroll Luna, MD;  Location: [facility];  Service: General;  Laterality: Left;   Social History   Social History  . Marital Status: Married    Spouse Name: N/A  . Number of Children: 4   . retired    Social History Main Topics  . Smoking status: Never Smoker   . Smokeless tobacco: Former Systems developer    Types: Snuff    Quit date: 10/02/2014     Comment: Quit 10/02/2014  previous 1 box per week of snuff   . Alcohol Use: No  . Drug Use: No   Current Outpatient Medications on File Prior to Visit  Medication Sig Dispense Refill  . amLODipine (NORVASC) 10 MG tablet TAKE 1 TABLET (10 MG TOTAL) BY MOUTH DAILY. 90 tablet 1  . aspirin 81 MG tablet Take 81 mg by mouth daily.      . B-D INS SYRINGE 0.5CC/31GX5/16 31G X 5/16" 0.5 ML MISC USE AS DIRECTED 4 TIMES A DAY 300 each 1  . carvedilol (COREG) 25 MG tablet TAKE 1 TABLET BY MOUTH TWICE A DAY WITH A MEAL 180 tablet 0  . cloNIDine (CATAPRES) 0.3 MG tablet Take 0.3 mg by mouth 2 (two) times daily.    . Insulin Glargine (LANTUS SOLOSTAR) 100 UNIT/ML Solostar Pen Inject 24 units into the skin 10 pen 5  . Insulin Pen Needle 31G X 5 MM MISC Use pen needles for insulin injection daily 100 each 3  . insulin regular (NOVOLIN R,HUMULIN R) 100 units/mL injection Inject 0.05-0.1 mLs (5-10 Units total) into the skin 3 (three) times daily before meals. Relion. Please provide insulin syringes needed 10 mL  11  . Insulin Syringe-Needle U-100 (B-D INS SYRINGE 0.5CC/30GX1/2") 30G X 1/2" 0.5 ML MISC USE AS DIRECTED 4 TIMES A DAY 300 each 3  . letrozole (FEMARA) 2.5 MG tablet Take 1 tablet (2.5 mg total) by mouth daily. 30 tablet 2  . lisinopril-hydrochlorothiazide (PRINZIDE,ZESTORETIC) 10-12.5 MG tablet Take 1 tablet by mouth daily. 90 tablet 3  . lovastatin (MEVACOR) 40 MG tablet Take 1 tablet (40 mg total) by mouth at bedtime. 90 tablet 3  . metFORMIN (GLUCOPHAGE) 500 MG tablet TAKE 2 TABLETS BY MOUTH DAILY WITH SUPPER. 180 tablet 0  . Na Sulfate-K Sulfate-Mg Sulf 17.5-3.13-1.6 GM/177ML SOLN Take 1 kit  by mouth as directed. 354 mL 0  . OVER THE COUNTER MEDICATION Take 1 tablet by mouth daily as needed. Stool softner for constipation, also drinks warm prune juice prn     No current facility-administered medications on file prior to visit.    No Known Allergies Family History  Problem Relation Age of Onset  . Diabetes Sister        x 4  . Diabetes Brother   . Diabetes Paternal Grandmother   . Diabetes Paternal Aunt   . Heart disease Brother   . Heart disease Sister   . Emphysema Brother   . Cancer Father 18       brain tumor   . Colon cancer Neg Hx   . Stomach cancer Neg Hx    PE: There were no vitals taken for this visit. There is no height or weight on file to calculate BMI. Wt Readings from Last 3 Encounters:  11/08/17 177 lb (80.3 kg)  10/10/17 179 lb 12.8 oz (81.6 kg)  09/12/17 174 lb 14.4 oz (79.3 kg)   Constitutional: overweight, in NAD Eyes: PERRLA, EOMI, no exophthalmos ENT: moist mucous membranes, no thyromegaly, no cervical lymphadenopathy Cardiovascular: RRR, No MRG Respiratory: CTA B Gastrointestinal: abdomen soft, NT, ND, BS+ Musculoskeletal: no deformities, strength intact in all 4 Skin: moist, warm, no rashes Neurological: no tremor with outstretched hands, DTR normal in all 4  ASSESSMENT: 1. DM2, insulin-dependent, uncontrolled, with complications - + DR  (severe, non-proliferative)  2. HL  PLAN:  1.  Patient with long-standing, uncontrolled, diabetes, on basal-bolus insulin regimen and metformin, with higher sugars at last 2 visits due to not taking her mealtime insulin as prescribed.  At last visit, I advised her to start taking this before every meal.  We also increase her Humalog before regular meal to 8 units.   - Since last visit, she had to come off analog insulin due to price and Dr. Yong Channel started her on regular insulin.  She is using vials.  She is not taking her insulin with her so she misses the lunchtime insulin bolus usually.  Subsequently, sugars before dinner are high.  We discussed about the necessity of taking the insulin with her.  I advised her to take a pre-loaded syringe.  She will do so. - she is only using 6 units of insulin before meals, which is not enough.  Her sugars remain high throughout the day.  We will increase her insulin at this visit. - I also advised her to try to get the regular insulin from [facility], as [facility] - I suggested to:  Patient Instructions  Please continue: - Metformin 1000 mg daily with dinner - Lantus 24 units in am  Please increase Regular insulin (30 min before a meal): - 8 units with a smaller meal - 10 units with a regular meal - 12 units with a large meal/if you eat out/if you have dessert  Take an insulin injection with you at lunch.  Please return in 3 months with your sugar log.   - today, HbA1c is 9.2% (higher) - continue checking sugars at different times of the day - check 3x a day, rotating checks - advised for yearly eye exams >> she is UTD - Return to clinic in 3 mo with sugar log   2. HL -Reviewed latest lipid panel from 10/2017 : LDL higher -She was previously on Crestor 5 mg, now lovastatin 40 mg daily  - no  side effects  Philemon Kingdom, MD PhD [facility] Endocrinology

## 2017-11-29 NOTE — Addendum Note (Signed)
Addended by: Onalee Hua on: 11/29/2017 10:19 AM   Modules accepted: Orders

## 2017-11-29 NOTE — Patient Instructions (Addendum)
Please continue: - Metformin 1000 mg daily with dinner - Lantus 24 units in am  Please increase Regular insulin (30 min before a meal): - 8 units with a smaller meal - 10 units with a regular meal - 12 units with a large meal/if you eat out/if you have dessert  Take an insulin injection with you at lunch.  Please return in 3 months with your sugar log.

## 2017-12-04 ENCOUNTER — Other Ambulatory Visit: Payer: Self-pay | Admitting: Hematology

## 2017-12-07 ENCOUNTER — Telehealth: Payer: Self-pay | Admitting: Family Medicine

## 2017-12-07 NOTE — Telephone Encounter (Signed)
See note

## 2017-12-07 NOTE — Telephone Encounter (Signed)
Copied from [place] [id number]. Topic: Quick Communication - Rx Refill/Question >> Dec 07, 2017  2:48 PM Boyd Kerbs wrote:  Medication:   Albuterol  She has not used for a while, but feeling she needs sometimes now.   Has the patient contacted their pharmacy? No.   (Agent: If no, request that the patient contact the pharmacy for the refill.)   Preferred Pharmacy (with phone number or street name): [id number] [place], [place] [address] [place] [address] Phone: [phone] Fax: [phone]    Agent: Please be advised that RX refills may take up to 3 business days. We ask that you follow-up with your pharmacy.

## 2017-12-08 NOTE — Telephone Encounter (Signed)
May refill- needs to see [name] if she does not have relief when she uses the medication

## 2017-12-10 ENCOUNTER — Other Ambulatory Visit: Payer: Self-pay

## 2017-12-10 MED ORDER — ALBUTEROL SULFATE HFA 108 (90 BASE) MCG/ACT IN AERS: 2.0000 | INHALATION_SPRAY | Freq: Four times a day (QID) | RESPIRATORY_TRACT | 2 refills | Status: DC | PRN

## 2017-12-10 NOTE — Telephone Encounter (Signed)
Medication reordered as requested

## 2017-12-18 ENCOUNTER — Ambulatory Visit: Payer: Medicare Other | Admitting: Internal Medicine

## 2017-12-26 ENCOUNTER — Telehealth: Payer: Self-pay | Admitting: Family Medicine

## 2017-12-26 NOTE — Telephone Encounter (Signed)
Copied from [place] [id number]. Topic: Quick Communication - Rx Refill/Question >> Dec 26, 2017  1:39 PM Percell Belt A wrote: Medication: basaglar insulin Has the patient contacted their pharmacy? NO (Agent: If no, request that the patient contact the pharmacy for the refill.) Preferred Pharmacy (with phone number [facility] [address] - pt would like to take the basaglar insulin because the Lantus is $300.00 Agent: Please be advised that RX refills may take up to 3 business days. We ask that you follow-up with your pharmacy.

## 2017-12-26 NOTE — Telephone Encounter (Signed)
See pt. Request to change insulin.

## 2017-12-26 NOTE — Telephone Encounter (Signed)
See note

## 2017-12-27 NOTE — Telephone Encounter (Signed)
See note

## 2017-12-27 NOTE — Telephone Encounter (Signed)
See my note

## 2017-12-27 NOTE — Telephone Encounter (Signed)
Pt daugther in law called and said that the Nancee Liter is $140 with insurance $300 without. They don't have to have it changed but they need something sent in because pt is out of insulin. She states that pharmacy has requested and not received a reply for several days. [name] is requesting call back from [facility] at [phone] (Dr. Ansel Bong assistant).   [id number] [address], [place] [phone] (Phone) [phone] (Fax)

## 2017-12-27 NOTE — Telephone Encounter (Signed)
Called daughter in law and left a voicemail message asking for a return phone call

## 2017-12-27 NOTE — Telephone Encounter (Signed)
Pt calling checking on medication request. Wendy [name] is also 300.00 and pt would like to know if another insulin is cheaper.

## 2017-12-27 NOTE — Telephone Encounter (Signed)
Patient returned Jamie's call. Please call back.

## 2017-12-27 NOTE — Telephone Encounter (Signed)
Called and spoke to the pharmacy. They deny sending any request to our office. I did price check the insulins and while the patient's insurance is paying her co-pays are as follows:  Lantus- $398 Basaglar-$345 Novolin-$150 Levemir-$500  I will call daughter-in-law.

## 2017-12-27 NOTE — Telephone Encounter (Signed)
She is using regular insulin for the faster acting but was still to be on lantus- may refill whichever long acting insulin is cheaper between lantus, basaglar. She will need a visit to plan beyond that- may need to change her to nph/r

## 2017-12-28 ENCOUNTER — Other Ambulatory Visit: Payer: Self-pay

## 2017-12-28 ENCOUNTER — Telehealth: Payer: Self-pay | Admitting: Internal Medicine

## 2017-12-28 ENCOUNTER — Telehealth: Payer: Self-pay | Admitting: Family Medicine

## 2017-12-28 MED ORDER — INSULIN GLARGINE 100 UNIT/ML SOLOSTAR PEN: PEN_INJECTOR | SUBCUTANEOUS | 5 refills | Status: DC

## 2017-12-28 NOTE — Telephone Encounter (Signed)
Pt daughter [name] is calling [name] back [phone] is daughters number the daughter states that she will reach out to Dr Landry Corporal pt endocrinologist also

## 2017-12-28 NOTE — Telephone Encounter (Signed)
Copied from [place] [id number]. Topic: Quick Communication - Rx Refill/Question >> Dec 26, 2017  1:39 PM Wendy [name] wrote: Medication: basaglar insulin Has the patient contacted their pharmacy? NO (Agent: If no, request that the patient contact the pharmacy for the refill.) Preferred Pharm[facility] (with phone number [facility] [address] - pt would like to take the basaglar insulin because the Lantus is $300.00 Agent: Please be advised that RX refills may take up to 3 business days. We ask that you follow-up with your pharmacy. >> Dec 28, 2017 12:32 PM Cleaster Corin, NT wrote: Pt. Daughter Wendy [name] [phone]) calling back for [name]  ID: [facility] [employer] [phone]

## 2017-12-28 NOTE — Telephone Encounter (Signed)
See note

## 2017-12-28 NOTE — Telephone Encounter (Signed)
Daughter [name] called ph# [phone] re: Patient needs advice on insulin and what patient's insurance covers (Dr. Yong Channel changed insulin to a vial for cost effective reasons). Insurance is not [place] very much-costing $300+). Right now she is on Novalin vial ($150 with insurance). Patient is completely out of insulin. [facility] [address] They would have called sooner but they were trying to go through Dr. Yong Channel first but decided to let Dr. Cruzita Lederer make the decision on which is the best insulin for patient

## 2017-12-28 NOTE — Telephone Encounter (Signed)
Medication sent to pharmacy as requested.

## 2017-12-28 NOTE — Telephone Encounter (Signed)
Let us give her [name] instead of Lantus and NovoLog instead of the Novolin for now until I see her in 2 months and will discuss about cheaper alternatives at that time.  I put these in the fridge near [employer].

## 2017-12-31 NOTE — Telephone Encounter (Signed)
Spoke to patient. Gave med instructions per Dr. Arman Filter previous note. Pt verbalized understanding and was very grateful.

## 2018-01-03 ENCOUNTER — Other Ambulatory Visit: Payer: Self-pay | Admitting: Hematology

## 2018-01-08 NOTE — Progress Notes (Signed)
Will He is in a  [place]  [phone] [phone]  Clinic Follow Up Note   Patient Care Team: Marin Olp, MD as PCP - General (Family Medicine) Philemon Kingdom, MD as Consulting Physician (Internal Medicine) Jalene Mullet, MD as Consulting Physician (Ophthalmology) Erroll Luna, MD as Consulting Physician (General Surgery) Kyung Rudd, MD as Consulting Physician (Radiation Oncology) [name], MD as Consulting Physician (Hematology) Gardenia Phlegm, NP as Nurse Practitioner (Hematology and Oncology) 01/09/2018  CHIEF COMPLAINTS:  Follow Up for Invasive Ductal Carcinoma of the left breast  Oncology History   Cancer Staging Malignant neoplasm of upper-outer quadrant of left breast in female, estrogen receptor positive ([place]) Staging form: Breast, AJCC 8th Edition - Clinical: Stage IIB (cT2, cN0, cM0, G3, ER: Positive, PR: Negative, HER2: Negative) - Signed by [name], MD on 11/23/2016 - Pathologic stage from 12/28/2016: Stage IIB (pT2, pN1a(sn), cM0, G3, ER: Positive, PR: Negative, HER2: Negative) - Signed by [name], MD on 01/02/2017       Malignant neoplasm of upper-outer quadrant of left breast in female, estrogen receptor positive ([place])   11/01/2016 Mammogram    Diagnostic mammogram and ultrasound of left breast and axilla showed a 3.1 x 2.1 x 1.4 cm (3.3 x 2.0 x 2.7 cm by ultrasound) mass in the upper outer quadrant of the anterior third of the left breast, associated with pleomorphic calcification. There is a 8 mm (1.8cm by [name]) prominent lymph node in the left axilla.      11/07/2016 Initial Biopsy    Left breast 1:00 position biopsy showed invasive ductal carcinoma and DCIS, G3, left axillary node biopsy was negative.      11/07/2016 Receptors her2    ER 80% positive, PR negative, HER-2 negative, Ki-67 90%      11/20/2016 Initial Diagnosis    Malignant neoplasm of upper-outer quadrant of left breast in female,  estrogen receptor positive ([place])      12/07/2016 Surgery    Left lumpectomy and left axillary sentinel lymph node sampling by Dr. Brantley Stage      12/07/2016 Pathology Results    pT2, pN1 Left Lumpectomy: Grade 3 IDC measuring 3.4 cm, carcinoma broadly present at the superior margin. Grade 3 DCIS. 1 out of 2 left axillary SLN positive for metastatic carcinoma      12/07/2016 Miscellaneous    Mammaprint showed high risk disease, basal type       12/28/2016 Surgery    Re-excision of the previously positive superior margin was negative for malignant cells.       01/04/2017 Surgery    Port inserted      01/18/2017 - 03/22/2017 Chemotherapy    Adjuvant Docetaxel 75 mg/m and Cytoxan 600 mg/m, every 21 days, for total of 4 cycles, with Neulasta on day 2.       04/23/2017 - 05/18/2017 Radiation Therapy    Radiation treatment dates:   04/23/17 - 05/18/17 Administered by Dr. Lisbeth Renshaw  Site/dose:    Left breast/ 42.5 Gy in 17 Fx Boost / 7.5 Gy in 3 Fx      06/12/2017 -  Anti-estrogen oral therapy    Adjuvant letrozole 1 mg daily, plan for 5-7 years       07/20/2017 Mammogram    IMPRESSION: No mammographic evidence of malignancy in either breast. 3.8 cm left breast postsurgical loculated seroma.      09/12/2017 Survivorship          HISTORY OF PRESENTING ILLNESS:  Wendy [name] 77 y.o. female is here because of invasive ductal carcinoma of the left breast. She is accompanied by her son to my clinic today. She was referred by her breast surgeon Dr. Brantley Stage.   About 2 months ago, the patient noted a mass on her left breast. She underwent a mammogram and breast US on 11/01/2016, which showed a 3.1 cm mass in the upper outer quadrant of the left breast with pleomorphic calcifications. Imaging also noted abnormal appearing lymph nodes in the left axilla. A core biopsy was done on 11/07/2016, which revealed invasive ductal carcinoma ER positive PR negative with a Ki-67 of 90%. On November 13, 2016 she  met with Dr. Brantley Stage to discuss a lumpectomy. She presents for further treatment.   She presents with her son today. She felt the lump herself about 2 months ago. She noticed last month that it had gotten a little bigger. So she went to her PCP, who ordered a mammogram. She does not get mammograms every year. Her last mammogram was May 2017, which showed no cancer. She had some pain and itching in her left nipple before her diagnosis. She has some lower back pain if she is laying down too long, but that has been present for a while. Denies any new bone or joint pain, weight loss, breast swelling, nipple discharge, or any other concerns. She is very active and takes care of herself and her husband.   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 They have a nurse for the rest of the week. He has dementia as well as numerous other health problems. Her son has moved in with her to help with her husband. She has a history of HTN and DM. Her DM is not well controlled; she takes medication for this. Her BP is also not well controlled on lisinopril. She has never had a heart attack or stroke. No surgeries in the past other than the removal of her benign lesion. No family history of breast cancer. Her father did have a brain tumor at the age of 32, which he passed from. Never smoker. She does not drink alcohol. She has 9 grandchildren.   GYN HISTORY  Menarchal: 77 y.o LMP: around 77 y.o Contraceptive: n/a HRT: No GP: 4 pregnancies, 4 children (3 boys, 1 girl). 1st child at age 29  CURRENT THERAPY: Adjuvant Letrozole, started 06/12/17  INTVERVAL HISTORY: Wendy [name] returns for a follow up. She presents to the clinic accompanied by her son. I last saw her 6 months ago. She reports she is doing well overall. She is compliant with Letrozole and reports no complaints. Her only compliant is some clear drainage from her left nipple. She states she had it drained and she has a follow up  appointment with Dr. Brantley Stage on 01/28/18. She also has a complaint of right flank pain onset 2 days ago. She is going to schedule a colonoscopy soon.   On review of systems, pt denies hot flash, new joint pain, fever, dysuria, blood in the stool, melena or any other complaints at this time. Pertinent positives are listed and detailed within the above HPI.   MEDICAL HISTORY:  Past Medical History:  Diagnosis Date  . Abnormal breast finding 2018   per pt/ having  a lot drainage from left breast nipple  . Breast cancer ([place]) 11/2016   left/  . GERD (gastroesophageal reflux disease)    TUMS as needed  . HTN (hypertension)  states BP has been high recently; has been on med. x 20 yr.  . Hyperlipidemia   . Insulin dependent diabetes mellitus ([place])   . Personal history of chemotherapy    2018/finished 6 weeks of chemo in Sep 2018  . Personal history of radiation therapy    2018 left breast/finished radiation in Sept 2018 per pt.    SURGICAL HISTORY: Past Surgical History:  Procedure Laterality Date  . BREAST LUMPECTOMY Left 05/05/2008  . BREAST LUMPECTOMY Left 12/07/2016   malignant  . BREAST LUMPECTOMY WITH RADIOACTIVE SEED AND SENTINEL LYMPH NODE BIOPSY Left 12/07/2016   Procedure: LEFT BREAST LUMPECTOMY WITH RADIOACTIVE SEED AND SENTINEL LYMPH NODE BIOPSY;  Surgeon: Erroll Luna, MD;  Location: [facility];  Service: General;  Laterality: Left;  . IR FLUORO GUIDE PORT INSERTION RIGHT  01/04/2017  . IR US GUIDE VASC ACCESS RIGHT  01/04/2017  . PORT-A-CATH REMOVAL N/A 05/30/2017   Procedure: REMOVAL PORT-A-CATH;  Surgeon: Erroll Luna, MD;  Location: [facility];  Service: General;  Laterality: N/A;  . RE-EXCISION OF BREAST LUMPECTOMY Left 12/28/2016   Procedure: RE-EXCISION OF BREAST LUMPECTOMY;  Surgeon: Erroll Luna, MD;  Location: [facility];  Service: General;  Laterality: Left;    SOCIAL HISTORY: Social History   Socioeconomic History  . Marital  status: Married    Spouse name: Not on file  . Number of children: 4  . Years of education: Not on file  . Highest education level: Not on file  Occupational History  . Occupation: DAY Armed forces operational officer: [employer]  Social Needs  . Financial resource strain: Not on file  . Food insecurity:    Worry: Not on file    Inability: Not on file  . Transportation needs:    Medical: Not on file    Non-medical: Not on file  Tobacco Use  . Smoking status: Former Smoker    Types: Cigarettes  . Smokeless tobacco: Former Systems developer    Types: Snuff    Quit date: 11/01/2017  . Tobacco comment: Pt has stopped  Substance and Sexual Activity  . Alcohol use: No    Alcohol/week: 0.0 oz  . Drug use: No  . Sexual activity: Not Currently    Partners: Male  Lifestyle  . Physical activity:    Days per week: Not on file    Minutes per session: Not on file  . Stress: Not on file  Relationships  . Social connections:    Talks on phone: Not on file    Gets together: Not on file    Attends religious service: Not on file    Active member of club or organization: Not on file    Attends meetings of clubs or organizations: Not on file    Relationship status: Not on file  . Intimate partner violence:    Fear of current or ex partner: Not on file    Emotionally abused: Not on file    Physically abused: Not on file    Forced sexual activity: Not on file  Other Topics Concern  . Not on file  Social History Narrative  . Not on file    FAMILY HISTORY: Family History  Problem Relation Age of Onset  . Diabetes Sister        x 4  . Diabetes Brother   . Diabetes Paternal Grandmother   . Diabetes Paternal Aunt   . Heart disease Brother   . Heart disease Sister   .  Emphysema Brother   . Cancer Father 15       brain tumor   . Colon cancer Neg Hx   . Stomach cancer Neg Hx     ALLERGIES:  has No Known Allergies.  MEDICATIONS:  Current Outpatient Medications  Medication Sig Dispense Refill    . albuterol (PROVENTIL HFA;VENTOLIN HFA) 108 (90 Base) MCG/ACT inhaler Inhale 2 puffs into the lungs every 6 (six) hours as needed for wheezing or shortness of breath. 1 Inhaler 2  . amLODipine (NORVASC) 10 MG tablet TAKE 1 TABLET (10 MG TOTAL) BY MOUTH DAILY. 90 tablet 1  . aspirin 81 MG tablet Take 81 mg by mouth daily.      . B-D INS SYRINGE 0.5CC/31GX5/16 31G X 5/16" 0.5 ML MISC USE AS DIRECTED 4 TIMES A DAY 300 each 1  . carvedilol (COREG) 25 MG tablet TAKE 1 TABLET BY MOUTH TWICE A DAY WITH A MEAL 180 tablet 0  . cloNIDine (CATAPRES) 0.3 MG tablet Take 0.3 mg by mouth 2 (two) times daily.    . Insulin Glargine (LANTUS SOLOSTAR) 100 UNIT/ML Solostar Pen Inject 24 units into the skin 10 pen 5  . Insulin Pen Needle 31G X 5 MM MISC Use pen needles for insulin injection daily 100 each 3  . insulin regular (NOVOLIN R,HUMULIN R) 100 units/mL injection Inject 0.05-0.1 mLs (5-10 Units total) into the skin 3 (three) times daily before meals. [place]ion. Please provide insulin syringes needed 10 mL 11  . Insulin Syringe-Needle U-100 (B-D INS SYRINGE 0.5CC/30GX1/2") 30G X 1/2" 0.5 ML MISC USE AS DIRECTED 4 TIMES A DAY 300 each 3  . letrozole (FEMARA) 2.5 MG tablet TAKE 1 TABLET BY MOUTH EVERY DAY 30 tablet 2  . lisinopril-hydrochlorothiazide (PRINZIDE,ZESTORETIC) 10-12.5 MG tablet Take 1 tablet by mouth daily. 90 tablet 3  . lovastatin (MEVACOR) 40 MG tablet Take 1 tablet (40 mg total) by mouth at bedtime. 90 tablet 3  . metFORMIN (GLUCOPHAGE) 500 MG tablet TAKE 2 TABLETS BY MOUTH DAILY WITH SUPPER. 180 tablet 0  . Na Sulfate-K Sulfate-Mg Sulf 17.5-3.13-1.6 GM/177ML SOLN Take 1 kit by mouth as directed. 354 mL 0  . OVER THE COUNTER MEDICATION Take 1 tablet by mouth daily as needed. Stool softner for constipation, also drinks warm prune juice prn     No current facility-administered medications for this visit.     REVIEW OF SYSTEMS:   Constitutional: Denies fevers, chills or abnormal night sweats (+)  fatigue Eyes: Denies blurriness of vision, double vision or watery eyes Ears, nose, mouth, throat, and face: Denies mucositis or sore throat Respiratory: Denies cough, dyspnea or wheezes Cardiovascular: Denies palpitation, chest discomfort or lower extremity swelling, Gastrointestinal:  Denies nausea, heartburn  GU: (+) right flank pain Skin: Denies abnormal skin rashes  Lymphatics: Denies new lymphadenopathy or easy bruising Neurological:Denies numbness, tingling or new weaknesses Behavioral/Psych: Mood is stable, no new changes  Breast: (+) Left breast skin pigmentation, (+) left nipple discharge All other systems were reviewed with the patient and are negative.  PHYSICAL EXAMINATION:  ECOG PERFORMANCE STATUS: 2  Vitals:   01/09/18 1050  BP: (!) 129/46  Pulse: 66  Resp: 17  Temp: 97.7 F (36.5 C)  SpO2: 100%   Filed Weights   01/09/18 1050  Weight: 181 lb 1.6 oz (82.1 kg)    GENERAL: alert, oriented and answers questions appropriately, no distress and comfortable SKIN: skin color, texture, turgor are normal, no rashes or significant lesions  EYES: normal, conjunctiva are pink  and non-injected, sclera clear OROPHARYNX:no exudate, no erythema and lips, buccal mucosa, and tongue normal  NECK: supple, thyroid normal size, non-tender, without nodularity LYMPH:  no palpable lymphadenopathy in the cervical, axillary or inguinal LUNGS: clear to auscultation and percussion with normal breathing effort HEART: regular rate & rhythm and no murmurs and no lower extremity edema ABDOMEN:abdomen soft, non-tender and normal bowel sounds Musculoskeletal:no cyanosis of digits and no clubbing  PSYCH: alert & oriented x 3 with fluent speech NEURO: no focal motor/sensory deficits Breasts: Breast inspection showed them to be symmetrical with no nipple discharge. She has significant scar tissue in the upper outer quadrant of the right breast with some clear discharge. No skin erythema, no  palpable mass or adenopathy. Mild skin hyperpigmentation of the left breast secondary to radiation.  LABORATORY DATA:  I have reviewed the data as listed CBC Latest Ref Rng & Units 01/09/2018 10/10/2017 06/12/2017  WBC 3.9 - 10.3 K/uL 4.9 6.0 4.7  Hemoglobin 11.6 - 15.9 g/dL 10.8(L) 12.1 11.4(L)  Hematocrit 34.8 - 46.6 % 31.8(L) 36.1 34.0(L)  Platelets 145 - 400 K/uL 257 289.0 218   CMP Latest Ref Rng & Units 01/09/2018 10/10/2017 06/12/2017  Glucose 70 - 140 mg/dL 190(H) 153(H) 184(H)  BUN 7 - 26 mg/dL 18 21 14.9  Creatinine 0.60 - 1.10 mg/dL 1.10 1.07 1.1  Sodium 136 - 145 mmol/L 140 140 141  Potassium 3.5 - 5.1 mmol/L 3.9 4.3 4.0  Chloride 98 - 109 mmol/L 104 99 -  CO2 22 - 29 mmol/L 27 33(H) 26  Calcium 8.4 - 10.4 mg/dL 9.4 9.7 9.6  Total Protein 6.4 - 8.3 g/dL 6.8 7.2 7.2  Total Bilirubin 0.2 - 1.2 mg/dL 0.3 0.4 0.29  Alkaline Phos 40 - 150 U/L 86 82 75  AST 5 - 34 U/L 9 8 8   ALT 0 - 55 U/L 6 5 <6   PATHOLOGY: Diagnosis 12/28/16 Breast, excision, left, superior margin - BENIGN FIBROADIPOSE TISSUE WITH HEALING BIOPSY SITE. - BENIGN SKELETAL MUSCLE. - THERE IS NO EVIDENCE OF MALIGNANCY. - SEE COMMENT. Microscopic Comment The surgical resection margin(s) of the specimen were inked and microscopically evaluated. (JBK:ah 01/01/17) Enid Cutter MD Pathologist, Electronic Signature (Case signed 01/01/2017)  Mammaprint 12/07/16   Diagnosis 12/07/2016 1. 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 Lymph node, sentinel, biopsy, Left Axillary ONE BENIGN LYMPH NODE (0/1) 3. Lymph node, sentinel, biopsy, Left METASTATIC CARCINOMA IN ONE OF ONE LYMPH NODE (1/1) 4. Breast, excision, Left additional Medial Margin BENIGN BREAST TISSUE Microscopic Comment 1. BREAST, INVASIVE TUMOR Procedure: Lumpectomy Laterality: Left Tumor Size: 3.4  cm Histologic Type: Ductal carcinoma Grade: Tubular Differentiation: 3 Nuclear Pleomorphism: 2 Mitotic Count:3 Ductal Carcinoma in Situ (DCIS): present Extent of Tumor: Skin: negative Nipple: negative Skeletal muscle: negative Margins: Invasive carcinoma, distance from closest margin: invasive carcinoma presented at the superior cauterized margin DCIS, distance from closest margin: 0.1 cm from the superior margin Regional Lymph Nodes: Number of Lymph Nodes Examined: 1 of 3 FINAL for Wendy [name], Wendy [name] ([id number]) Microscopic Comment(continued) Number of Sentinel Lymph Nodes Examined: 2 Lymph Nodes with Macrometastases: 1 Lymph Nodes with Micrometastases: 0 Lymph Nodes with Isolated Tumor Cells: 0 Breast Prognostic Profile: Estrogen Receptor: 80% Progesterone Receptor: 0% Her2: Negative Ki-67: 90% Pathologic Stage Classification (pTNM, AJCC 8th Edition): Primary Tumor (pT): pT2 Regional Lymph Nodes (pN): pN1 Distant Metastases (pM):  pMx Casimer Lanius MD Pathologist, Electronic Signature (Case signed 12/10/2016)  Diagnosis 11/07/2016 1. Breast, left, needle core biopsy, 1:00 o'clock upper outer quadrant - INVASIVE DUCTAL CARCINOMA. - DUCTAL CARCINOMA IN SITU - SEE COMMENT. 2. Lymph node, needle/core biopsy, inferior left axilla - ONE PARTIALLY SAMPLED BENIGN LYMPH NODE WITH NO TUMOR SEEN. Microscopic Comment 1. Although definitive grading of breast carcinoma is best done on excision, the features of the invasive tumor from the left 1 o'clock upper outer quadrant breast biopsy are compatible with a grade 3 breast carcinoma. Breast prognostic markers will be performed and reported in an addendum. Findings are called to [facility] on 11/08/2016. Dr. Lyndon Code has seen the left 1:00 breast biopsy in consultation with agreement. 2. The findings from the left inferior axillary lymph node are called to the [facility] on 11/08/2016. Dr. Lyndon Code has seen the lymph  node biopsy in consultation with agreement. (RH:kh 11/08/16) 1. FLUORESCENCE IN-SITU HYBRIDIZATION Results: HER2 - NEGATIVE RATIO OF HER2/CEP17 SIGNALS 1.71 AVERAGE HER2 COPY NUMBER PER CELL 2.90 Reference Range: NEGATIVE HER2/CEP17 Ratio <2.0 and average HER2 copy number <4.0 EQUIVOCAL HER2/CEP17 Ratio <2.0 and average HER2 copy number >=4.0 and <6.0 POSITIVE HER2/CEP17 Ratio >=2.0 or <2.0 and average HER2 copy number >=6.0 1. PROGNOSTIC INDICATORS Results: IMMUNOHISTOCHEMICAL AND MORPHOMETRIC ANALYSIS PERFORMED MANUALLY Estrogen Receptor: 80%, POSITIVE, WEAK STAINING INTENSITY Progesterone Receptor: 0%, NEGATIVE Proliferation Marker Ki67: 90% COMMENT: The negative hormone receptor study(ies) in this case has An internal positive control. REFERENCE RANGE ESTROGEN RECEPTOR NEGATIVE 0% POSITIVE =>1% REFERENCE RANGE PROGESTERONE RECEPTOR NEGATIVE 0% POSITIVE =>1%  RADIOGRAPHIC STUDIES: I have personally reviewed the radiological images as listed and agreed with the findings in the report. No results found.   Diagnostic Mammogram and [name] 07/20/17 IMPRESSION: No mammographic evidence of malignancy in either breast. 3.8 cm left breast postsurgical loculated seroma.  Mammogram: 11/01/16 Diagnostic mammogram and ultrasound of left breast and axilla showed a 3.1 x 2.1 x 1.4 cm (3.3 x 2.0 x 2.7 cm by ultrasound) mass in the upper outer quadrant of the anterior third of the left breast, associated with pleomorphic calcification. There is a 8 mm (1.8cm by [name]) prominent lymph node in the left axilla.   ASSESSMENT & PLAN:  Wendy [name] is a 77 y.o. female who presented with a self palpable left breast mass  1. Malignant neoplasm of upper-outer quadrant of left breast, Invasive Ductal Carcinoma, pT2pN1M0, stage IIB, ER+/PR-/HER2-, G3, mammaprint high risk  --I previously discussed her surgical path result in details, she had reexcision for positive margins, her final surgical margins were  negative. -Left lumpectomy and SLN biopsy on 12/07/16 revealed grade 3 IDC measuring 3.4 cm, carcinoma was broadly present at the superior margin, grade 3 DCIS, and 1 out of 2 left axillary SLNs were positive for metastatic carcinoma. With carcinoma broadly present at the superior margin. Excision of the left superior margin on 12/28/16 showed no evidence of malignancy. -Mammaprint showed high risk disease, basal type.The average 10 year risk of recurrence without adjuvant therapy is 29%. -I previously recommend her to consider adjuvant chemotherapy to reduce her risk of recurrence after surgery. -she received adjuvant chemotherapy with 4 cycles of Docetaxel and Cytoxan from April 2018 - June 2018, tolerated well. -She started RT with Dr. Lisbeth Renshaw 04/23/17 and completed 05/18/17 -Port was removed 05/30/17 by Dr. Brantley Stage -she has started adjuvant Letrozole in Sep 2018, plan for a total of 7 years if she tolerates well  -We previously reviewed her breast  cancer surveillance, I encouraged her to continue annual diagnostic mammogram, self exam, healthy diet, exercise regular, and a follow-up US regularly. -She attended Survivorship clinic on Dec 2018 -She is clinically doing well and tolerating Letrozole.  Labs reviewed, she is mildly anemic, she will start a multivitamin. Her physical exam was unremarkable except significant scar tissue in the upper outer quadrant of the right breast with some clear discharge. Her last mammogram from 07/20/17 showed no evidence of malignancy. There is no concern for recurrence.  -Next mammogram in oct 2019, will order next visit  -She will f/u with Dr. Brantley Stage on 01/28/18 -F/u in 4 months   2. HTN and hypotension  -She was noticed to be hypertensive previously in my clinic, and complains of dizziness, she has been on 5 different blood pressure medications, I previously instructed her to hold on hydrochlorothiazide and, and repeat blood pressure at home.  -We gave her 1L NS for  this previously  -This was likely medication induced hypotension, no concerns of sepsis  -I'll copied my note to her primary care physician Dr. Quay Burow. I encouraged her to follow-up with Dr. Quay Burow.   3. DM -she is on insulin and metformin -I encouraged her to check her blood glucose in a follow-up with her endocrinologist  4. CKD stage III -She had slightly elevated creatinine at baseline, EGFR 46 previously, probably secondary to her diabetes and hypertension -We'll try to avoid dehydration, and the nephrotoxic medications. -Her cr has been stable   5. Mild Anemia -Her Hgb decreased to 10.8 on 01/09/18 labs, normal MCV. She has had mild anemia in the past, no signs of GI bleed or other bleeding. She is planning to schedule a colonoscopy soon, she is due -I will check iron studies and reticulocyte count next visit -She will start a multivitamin daily   6. Mild Right Flank Pain  -her pain is new onset 2 days ago. She denies fever or dysuria. -Urine sample today for UA and UC, I will call with results  -No know antibiotic allergies     Plan:  -continue Letrozole  -F/u with Dr. Brantley Stage on 01/28/18 -Urine test today, will call with results -Start multivitamin  -Colonoscopy soon  -Mamm[facility] at [facility] in Oct 2019, will order next visit  -Lab and f/u in 4 months   All questions were answered. The patient knows to call the clinic with any problems, questions or concerns.  I spent 20 minutes counseling the patient face to face. The total time spent in the appointment was 25 minutes and more than 50% was on counseling.  This document serves as a record of services personally performed by [name], MD. It was created on her behalf by Theresia Bough, a trained medical scribe. The creation of this record is based on the scribe's personal observations and the provider's statements to them.   I have reviewed the above documentation for accuracy and completeness, and I agree with the above.     [name], MD 01/09/2018

## 2018-01-09 ENCOUNTER — Inpatient Hospital Stay: Payer: Medicare Other | Attending: Hematology

## 2018-01-09 ENCOUNTER — Encounter: Payer: Self-pay | Admitting: Hematology

## 2018-01-09 ENCOUNTER — Telehealth: Payer: Self-pay

## 2018-01-09 ENCOUNTER — Inpatient Hospital Stay: Payer: Medicare Other

## 2018-01-09 ENCOUNTER — Inpatient Hospital Stay (HOSPITAL_BASED_OUTPATIENT_CLINIC_OR_DEPARTMENT_OTHER): Payer: Medicare Other | Admitting: Hematology

## 2018-01-09 VITALS — BP 129/46 | HR 66 | Temp 97.7°F | Resp 17 | Ht 62.5 in | Wt 181.1 lb

## 2018-01-09 DIAGNOSIS — R109 Unspecified abdominal pain: Secondary | ICD-10-CM | POA: Insufficient documentation

## 2018-01-09 DIAGNOSIS — Z79811 Long term (current) use of aromatase inhibitors: Secondary | ICD-10-CM | POA: Insufficient documentation

## 2018-01-09 DIAGNOSIS — Z794 Long term (current) use of insulin: Secondary | ICD-10-CM

## 2018-01-09 DIAGNOSIS — D649 Anemia, unspecified: Secondary | ICD-10-CM | POA: Insufficient documentation

## 2018-01-09 DIAGNOSIS — I129 Hypertensive chronic kidney disease with stage 1 through stage 4 chronic kidney disease, or unspecified chronic kidney disease: Secondary | ICD-10-CM | POA: Diagnosis not present

## 2018-01-09 DIAGNOSIS — N183 Chronic kidney disease, stage 3 unspecified: Secondary | ICD-10-CM

## 2018-01-09 DIAGNOSIS — I959 Hypotension, unspecified: Secondary | ICD-10-CM

## 2018-01-09 DIAGNOSIS — N390 Urinary tract infection, site not specified: Secondary | ICD-10-CM

## 2018-01-09 DIAGNOSIS — Z170 Estrogen receptor positive status [ER+]: Secondary | ICD-10-CM

## 2018-01-09 DIAGNOSIS — C50412 Malignant neoplasm of upper-outer quadrant of left female breast: Secondary | ICD-10-CM

## 2018-01-09 DIAGNOSIS — E1122 Type 2 diabetes mellitus with diabetic chronic kidney disease: Secondary | ICD-10-CM | POA: Diagnosis not present

## 2018-01-09 DIAGNOSIS — M8580 Other specified disorders of bone density and structure, unspecified site: Secondary | ICD-10-CM

## 2018-01-09 LAB — URINALYSIS, COMPLETE (UACMP) WITH MICROSCOPIC
BACTERIA UA: NONE SEEN
Bilirubin Urine: NEGATIVE
GLUCOSE, UA: NEGATIVE mg/dL
HGB URINE DIPSTICK: NEGATIVE
KETONES UR: NEGATIVE mg/dL
NITRITE: NEGATIVE
PH: 5 (ref 5.0–8.0)
PROTEIN: 100 mg/dL — AB
Specific Gravity, Urine: 1.014 (ref 1.005–1.030)

## 2018-01-09 LAB — COMPREHENSIVE METABOLIC PANEL
ALBUMIN: 3.3 g/dL — AB (ref 3.5–5.0)
ALK PHOS: 86 U/L (ref 40–150)
ALT: 6 U/L (ref 0–55)
ANION GAP: 9 (ref 3–11)
AST: 9 U/L (ref 5–34)
BILIRUBIN TOTAL: 0.3 mg/dL (ref 0.2–1.2)
BUN: 18 mg/dL (ref 7–26)
CALCIUM: 9.4 mg/dL (ref 8.4–10.4)
CO2: 27 mmol/L (ref 22–29)
Chloride: 104 mmol/L (ref 98–109)
Creatinine, Ser: 1.1 mg/dL (ref 0.60–1.10)
GFR calc Af Amer: 55 mL/min — ABNORMAL LOW (ref >60)
GFR calc non Af Amer: 48 mL/min — ABNORMAL LOW (ref >60)
GLUCOSE: 190 mg/dL — AB (ref 70–140)
Potassium: 3.9 mmol/L (ref 3.5–5.1)
SODIUM: 140 mmol/L (ref 136–145)
TOTAL PROTEIN: 6.8 g/dL (ref 6.4–8.3)

## 2018-01-09 LAB — CBC WITH DIFFERENTIAL/PLATELET
Basophils Absolute: 0 10*3/uL (ref 0.0–0.1)
Basophils Relative: 0 %
EOS ABS: 0.1 10*3/uL (ref 0.0–0.5)
EOS PCT: 2 %
HCT: 31.8 % — ABNORMAL LOW (ref 34.8–46.6)
Hemoglobin: 10.8 g/dL — ABNORMAL LOW (ref 11.6–15.9)
LYMPHS PCT: 13 %
Lymphs Abs: 0.6 10*3/uL — ABNORMAL LOW (ref 0.9–3.3)
MCH: 28.1 pg (ref 25.1–34.0)
MCHC: 33.9 g/dL (ref 31.5–36.0)
MCV: 82.8 fL (ref 79.5–101.0)
MONOS PCT: 7 %
Monocytes Absolute: 0.3 10*3/uL (ref 0.1–0.9)
Neutro Abs: 3.8 10*3/uL (ref 1.5–6.5)
Neutrophils Relative %: 78 %
PLATELETS: 257 10*3/uL (ref 145–400)
RBC: 3.84 MIL/uL (ref 3.70–5.45)
RDW: 15.8 % — ABNORMAL HIGH (ref 11.2–14.5)
WBC: 4.9 10*3/uL (ref 3.9–10.3)

## 2018-01-09 LAB — IRON AND TIBC
Iron: 66 ug/dL (ref 41–142)
SATURATION RATIOS: 27 % (ref 21–57)
TIBC: 241 ug/dL (ref 236–444)
UIBC: 176 ug/dL

## 2018-01-09 LAB — RETICULOCYTES
RBC.: 3.85 MIL/uL (ref 3.70–5.45)
RETIC COUNT ABSOLUTE: 42.4 10*3/uL (ref 33.7–90.7)
RETIC CT PCT: 1.1 % (ref 0.7–2.1)

## 2018-01-09 LAB — FERRITIN: Ferritin: 83 ng/mL (ref 9–269)

## 2018-01-09 NOTE — Telephone Encounter (Signed)
Printed avs and calender of upcoming appointment. Per 4/10 los

## 2018-01-10 LAB — URINE CULTURE: Culture: 60000 — AB

## 2018-01-10 LAB — CANCER ANTIGEN 27.29: CAN 27.29: 13.6 U/mL (ref 0.0–38.6)

## 2018-01-11 ENCOUNTER — Telehealth: Payer: Self-pay | Admitting: *Deleted

## 2018-01-11 NOTE — Telephone Encounter (Signed)
-----   Message from [name], MD sent at 01/09/2018  7:11 PM EDT ----- Please let her know her iron study today was normal. UA not remarkable, will wait for culture, no antibiotics for now. Thanks  [name]  01/09/2018

## 2018-01-11 NOTE — Telephone Encounter (Signed)
Left message for pt to return call.

## 2018-01-12 ENCOUNTER — Other Ambulatory Visit: Payer: Self-pay | Admitting: Hematology

## 2018-01-12 MED ORDER — AMOXICILLIN-POT CLAVULANATE 875-125 MG PO TABS: 1.0000 | ORAL_TABLET | Freq: Two times a day (BID) | ORAL | 0 refills | Status: DC

## 2018-01-12 NOTE — Telephone Encounter (Signed)
I called pt today and discussed her urine culture result, which showed positive strep B. She has UTI symptoms also. I called in augmentin 875mg  bid for 5 days for her today, and asked her to update [name] in 3-4 days. I will copy her PCP Dr. Yong Channel also. She voiced good understanding and appreciated the call.  Wendy [name]  01/12/2018

## 2018-01-15 ENCOUNTER — Ambulatory Visit (INDEPENDENT_AMBULATORY_CARE_PROVIDER_SITE_OTHER): Payer: Medicare Other | Admitting: Family Medicine

## 2018-01-15 ENCOUNTER — Encounter: Payer: Self-pay | Admitting: Family Medicine

## 2018-01-15 VITALS — BP 142/82 | HR 88 | Temp 98.1°F | Ht 62.5 in | Wt 183.2 lb

## 2018-01-15 DIAGNOSIS — R1031 Right lower quadrant pain: Secondary | ICD-10-CM | POA: Diagnosis not present

## 2018-01-15 DIAGNOSIS — N390 Urinary tract infection, site not specified: Secondary | ICD-10-CM

## 2018-01-15 LAB — POCT URINALYSIS DIPSTICK
Bilirubin, UA: NEGATIVE
Blood, UA: NEGATIVE
GLUCOSE UA: NEGATIVE
KETONES UA: NEGATIVE
Leukocytes, UA: NEGATIVE
NITRITE UA: NEGATIVE
SPEC GRAV UA: 1.01 (ref 1.010–1.025)
Urobilinogen, UA: 0.2 E.U./dL
pH, UA: 5.5 (ref 5.0–8.0)

## 2018-01-15 NOTE — Progress Notes (Signed)
Subjective:  Wendy [name] is a 77 y.o. year old very pleasant female patient who presents for/with See problem oriented charting ROS-  No fever or chills. Admits to high stress levels at home. No chest pain or shortness fo breath reported.    Past Medical History-  Patient Active Problem List   Diagnosis Date Noted  . Malignant neoplasm of upper-outer quadrant of left breast in female, estrogen receptor positive ([place]) 11/20/2016    Priority: High  . 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 Chronic diastolic heart failure ([place]) 07/06/2011    Priority: High  . Rectal bleeding 01/17/2017    Priority: Medium  . CKD (chronic kidney disease) stage 3, GFR 30-59 ml/min (H[facility]) 03/17/2016    Priority: Medium  . Osteopenia 01/05/2016    Priority: Medium  . TOBACCO USE 04/13/2008    Priority: Medium  . Hyperlipidemia 11/29/2006    Priority: Medium  . OBESITY, BMI 30-35 11/29/2006    Priority: Medium  . HYPERTENSION, BENIGN SYSTEMIC 11/29/2006    Priority: Medium  . Fatigue 03/14/2017    Priority: Low  . Edema 03/14/2017    Priority: Low  . Port catheter in place 01/18/2017    Priority: Low  . Constipation 01/17/2017    Priority: Low  . Seasonal allergies 12/01/2013    Priority: Low  . Anemia 01/09/2018    Medications- reviewed and updated Current Outpatient Medications  Medication Sig Dispense Refill  . albuterol (PROVENTIL HFA;VENTOLIN HFA) 108 (90 Base) MCG/ACT inhaler Inhale 2 puffs into the lungs every 6 (six) hours as needed for wheezing or shortness of breath. 1 Inhaler 2  . amLODipine (NORVASC) 10 MG tablet TAKE 1 TABLET (10 MG TOTAL) BY MOUTH DAILY. 90 tablet 1  . amoxicillin-clavulanate (AUGMENTIN) 875-125 MG tablet Take 1 tablet by mouth 2 (two) times daily. 10 tablet 0  . aspirin 81 MG tablet Take 81 mg by mouth daily.      . B-D INS SYRINGE  0.5CC/31GX5/16 31G X 5/16" 0.5 ML MISC USE AS DIRECTED 4 TIMES A DAY 300 each 1  . carvedilol (COREG) 25 MG tablet TAKE 1 TABLET BY MOUTH TWICE A DAY WITH A MEAL 180 tablet 0  . cloNIDine (CATAPRES) 0.3 MG tablet Take 0.3 mg by mouth 2 (two) times daily.    . Insulin Glargine (LANTUS SOLOSTAR) 100 UNIT/ML Solostar Pen Inject 24 units into the skin 10 pen 5  . Insulin Pen Needle 31G X 5 MM MISC Use pen needles for insulin injection daily 100 each 3  . insulin regular (NOVOLIN R,HUMULIN R) 100 units/mL injection Inject 0.05-0.1 mLs (5-10 Units total) into the skin 3 (three) times daily before meals. Relion. Please provide insulin syringes needed 10 mL 11  . Insulin Syringe-Needle U-100 (B-D INS SYRINGE 0.5CC/30GX1/2") 30G X 1/2" 0.5 ML MISC USE AS DIRECTED 4 TIMES A DAY 300 each 3  . letrozole (FEMARA) 2.5 MG tablet TAKE 1 TABLET BY MOUTH EVERY DAY 30 tablet 2  . lisinopril-hydrochlorothiazide (PRINZIDE,ZESTORETIC) 10-12.5 MG tablet Take 1 tablet by mouth daily. 90 tablet 3  . lovastatin (MEVACOR) 40 MG tablet Take 1 tablet (40 mg total) by mouth at bedtime. 90 tablet 3  . metFORMIN (GLUCOPHAGE) 500 MG tablet TAKE 2 TABLETS BY MOUTH DAILY WITH SUPPER. 180 tablet 0  . Na Sulfate-K Sulfate-Mg Sulf 17.5-3.13-1.6 GM/177ML SOLN Take 1 kit by mouth as directed.  354 mL 0  . OVER THE COUNTER MEDICATION Take 1 tablet by mouth daily as needed. Stool softner for constipation, also drinks warm prune juice prn     No current facility-administered medications for this visit.     Objective: BP (!) 142/82 (BP Location: Left Arm, Patient Position: Sitting, Cuff Size: Normal)   Pulse 88   Temp 98.1 F (36.7 C) (Oral)   Ht 5' 2.5" (1.588 m)   Wt 183 lb 3.2 oz (83.1 kg)   SpO2 97%   BMI 32.97 kg/m  Gen: NAD, appears fatigued and in pain CV: RRR no murmurs rubs or gallops Lungs: CTAB no crackles, wheeze, rhonchi Abdomen: soft/moderate right lower quadrant pain/nondistended/normal bowel sounds. No rebound or  guarding.  Mild CVA tenderness Ext: no edema Skin: warm, dry  Assessment/Plan:  Right lower quadrant abdominal pain - Plan: CT Abdomen Pelvis W Contrast, CANCELED: CT Abdomen Pelvis W Contrast UTI s/p treatment but with continued abdominal pain S: Right sided abdominal pain going around to the back for a little over a week. Also goes into the left side of her abdomen. Pain gets up to 10/10- feels like she will hit the floor. No fever or chills. No nausea or vomiting. Has worsened steadily over last week. Never had kidney stones.  Pain comes and goes. Has had to strain for bowel movements last week- has some loose stools but also some hard round stools. Took prune juice- didn't help. Milk of magnesia last night didn't help. Didn't have BM at all last week. Colonoscopy 2005- no adenomatous change to one polyp.   Patient seen on 01/09/18 and found to have 60k colonies S. Agalactiae in urine. Penicillin sensitive obviously and started on augmentin twice a day for 5 days by oncology. WBC not elevated last week. cmp ok other than GFR in mid 50s.   Stressors- husband didn't pay taxes 2014- $5000 back taxes and may be kicked out of home- working on this, daughter in hospital, husband Jeneen Rinks is sick .  A/P: constipation could be contributing(will trial mirlax tonight and tomorrow)  but with her reported 10/10 pain and how uncomfortable she appeared today as well as the waves of pain- think we need to rule out appendicitis as well as kidney stones so ordered CT for tomorrow. She had labs last week including CBC and CMP which were largely reassuring. Suspect UTI was adequately treated with augmentin with reassuring UA but will also get culture. She likely has CKD III based off GFR so contrast will need to be renally adjusted. She can use tylenol for pain overnight   Future Appointments  Date Time Provider [place]  02/07/2018 10:15 AM Marin Olp, MD [facility]  02/07/2018 11:00 AM Williemae Area, RN [facility]  02/27/2018 11:30 AM Philemon Kingdom, MD [facility] None  05/08/2018 10:15 AM [facility] 1 [facility] None  05/08/2018 10:45 AM [name], MD [facility] None   Lab/Order associations: Right lower quadrant abdominal pain - Plan: CT Abdomen Pelvis W Contrast, CANCELED: CT Abdomen Pelvis W Contrast  Return precautions advised.  Garret Reddish, MD

## 2018-01-15 NOTE — Patient Instructions (Addendum)
Health Maintenance Due  Topic Date Due  . OPHTHALMOLOGY EXAM-Patient will schedule appointment with her eye doctor.  01/14/2016   We will call you within a week or two about your referral for CT scan- we are hoping to get this done tomorrow. If you do not hear within 3 weeks, give [name] a call. It is possible they will need you to come back for labs before the scan.   Stay well hydrated, try miralax 1 capful tonight and tomorrow morning to see if that will help constipation  If you have fevers or worsening pain that you cannot bear with tylenol- please go to the emergency room.

## 2018-01-16 ENCOUNTER — Other Ambulatory Visit: Payer: Medicare Other

## 2018-01-16 LAB — URINE CULTURE
MICRO NUMBER:: 90467182
RESULT: NO GROWTH
SPECIMEN QUALITY: ADEQUATE

## 2018-01-17 ENCOUNTER — Ambulatory Visit (INDEPENDENT_AMBULATORY_CARE_PROVIDER_SITE_OTHER)
Admission: RE | Admit: 2018-01-17 | Discharge: 2018-01-17 | Disposition: A | Discharge status: Home / Self Care | Payer: Medicare Other | Source: Ambulatory Visit | Attending: Family Medicine | Admitting: Family Medicine

## 2018-01-17 DIAGNOSIS — R1031 Right lower quadrant pain: Secondary | ICD-10-CM | POA: Diagnosis not present

## 2018-01-17 MED ORDER — IOPAMIDOL (ISOVUE-300) INJECTION 61%
100.0000 mL | Freq: Once | INTRAVENOUS | Status: AC | PRN
  Administered 2018-01-17: 100 mL via INTRAVENOUS

## 2018-01-22 ENCOUNTER — Telehealth: Payer: Self-pay

## 2018-01-22 ENCOUNTER — Other Ambulatory Visit: Payer: Self-pay | Admitting: Internal Medicine

## 2018-01-22 ENCOUNTER — Telehealth: Payer: Self-pay | Admitting: Family Medicine

## 2018-01-22 DIAGNOSIS — I10 Essential (primary) hypertension: Secondary | ICD-10-CM

## 2018-01-22 NOTE — Telephone Encounter (Signed)
Called patient and left a message to call office back.  

## 2018-01-22 NOTE — Telephone Encounter (Signed)
Copied from [place] [id number]. Topic: Quick Communication - Lab Results >> Jan 22, 2018 11:19 AM Everrett Coombe, CMA wrote: Called patient to inform them of 01/15/2018 lab results. When patient returns call, triage nurse may disclose results.   Pt returning call about labs. Please call back at [phone].

## 2018-01-23 NOTE — Telephone Encounter (Signed)
Pt given lab results and documented in result note.  

## 2018-01-28 DIAGNOSIS — Z853 Personal history of malignant neoplasm of breast: Secondary | ICD-10-CM | POA: Diagnosis not present

## 2018-01-28 DIAGNOSIS — N6489 Other specified disorders of breast: Secondary | ICD-10-CM | POA: Diagnosis not present

## 2018-01-29 ENCOUNTER — Other Ambulatory Visit: Payer: Self-pay | Admitting: Internal Medicine

## 2018-02-06 NOTE — Progress Notes (Signed)
Subjective:   Wendy [name] is a 77 y.o. female who presents for Medicare Annual (Subsequent) preventive examination.  Reports health as 2018 left breast radiation Some leakage from her breast; taking a long time to dry up  IDDM  In to see Dr. Yong Channel today dtr obese and going to [facility]; drinking but recently stopped Has Shana that helps her with meds (future dtr in law) She is an Therapist, sports Spouse has dementia  Lives with her dtr   Diet Chol/hdl 3; hdl 65;  A1c 9.2 in 11/2017 Dr. Cruzita Lederer is following  Exercise  Former smoker; quit 11/01/2017 Need to define  Health Maintenance Due  Topic Date Due  . OPHTHALMOLOGY EXAM  01/14/2016      Dexa 12/2015 -1.2 - Dr. Yong Channel is going to refer per her report  Mammogram 07/2017 and followed at the  surgery center  Colonoscopy in process of scheduling per the patient's report  Eye exam - was going to Dr. Posey Pronto  Eyes have fluid in the back of them and Dr. Yong Channel is going to schedule (or refer)   Some nights she sleeps better than other  Volunteered today she has chewed tobacco since she was 16 and can't stop Now is trying to chew gum since March and decided she would quit. Plans dental work later this year   Objective:     Vitals: BP 136/72   Pulse 68   Ht 5' 2"  (1.575 m)   Wt 181 lb (82.1 kg)   BMI 33.11 kg/m   Body mass index is 33.11 kg/m.  Advanced Directives 02/07/2018 01/09/2018 07/25/2017 06/12/2017 05/30/2017 05/24/2017 04/05/2017  Does Patient Have a Medical Advance Directive? No No No - No No No  Would patient like information on creating a medical advance directive? - - - No - Patient declined Yes (MAU/Ambulatory/Procedural Areas - Information given) - -    Tobacco Social History   Tobacco Use  Smoking Status Former Smoker  . Types: Cigarettes  Smokeless Tobacco Former Systems developer  . Types: Snuff  . Quit date: 11/30/2017  Tobacco Comment   states she is determined to quit      Counseling given: Yes Comment: states she is  determined to quit    Clinical Intake:     Past Medical History:  Diagnosis Date  . Abnormal breast finding 2018   per pt/ having  a lot drainage from left breast nipple  . Breast cancer ([place]) 11/2016   left/  . GERD (gastroesophageal reflux disease)    TUMS as needed  . HTN (hypertension)    states BP has been high recently; has been on med. x 20 yr.  . Hyperlipidemia   . Insulin dependent diabetes mellitus ([place])   . Personal history of chemotherapy    2018/finished 6 weeks of chemo in Sep 2018  . Personal history of radiation therapy    2018 left breast/finished radiation in Sept 2018 per pt.   Past Surgical History:  Procedure Laterality Date  . BREAST LUMPECTOMY Left 05/05/2008  . BREAST LUMPECTOMY Left 12/07/2016   malignant  . BREAST LUMPECTOMY WITH RADIOACTIVE SEED AND SENTINEL LYMPH NODE BIOPSY Left 12/07/2016   Procedure: LEFT BREAST LUMPECTOMY WITH RADIOACTIVE SEED AND SENTINEL LYMPH NODE BIOPSY;  Surgeon: Erroll Luna, MD;  Location: [facility];  Service: General;  Laterality: Left;  . IR FLUORO GUIDE PORT INSERTION RIGHT  01/04/2017  . IR US GUIDE VASC ACCESS RIGHT  01/04/2017  . PORT-A-CATH REMOVAL N/A 05/30/2017  Procedure: REMOVAL PORT-A-CATH;  Surgeon: Erroll Luna, MD;  Location: [facility];  Service: General;  Laterality: N/A;  . RE-EXCISION OF BREAST LUMPECTOMY Left 12/28/2016   Procedure: RE-EXCISION OF BREAST LUMPECTOMY;  Surgeon: Erroll Luna, MD;  Location: [facility];  Service: General;  Laterality: Left;   Family History  Problem Relation Age of Onset  . Diabetes Sister        x 4  . Diabetes Brother   . Diabetes Paternal Grandmother   . Diabetes Paternal Aunt   . Heart disease Brother   . Heart disease Sister   . Emphysema Brother   . Cancer Father 34       brain tumor   . Colon cancer Neg Hx   . Stomach cancer Neg Hx    Social History   Socioeconomic History  . Marital status: Married    Spouse name: Not on  file  . Number of children: 4  . Years of education: Not on file  . Highest education level: Not on file  Occupational History  . Occupation: DAY Armed forces operational officer: [employer]  Social Needs  . Financial resource strain: Not on file  . Food insecurity:    Worry: Not on file    Inability: Not on file  . Transportation needs:    Medical: Not on file    Non-medical: Not on file  Tobacco Use  . Smoking status: Former Smoker    Types: Cigarettes  . Smokeless tobacco: Former Systems developer    Types: Snuff    Quit date: 11/30/2017  . Tobacco comment: states she is determined to quit   Substance and Sexual Activity  . Alcohol use: No    Alcohol/week: 0.0 oz  . Drug use: No  . Sexual activity: Not Currently    Partners: Male  Lifestyle  . Physical activity:    Days per week: Not on file    Minutes per session: Not on file  . Stress: Not on file  Relationships  . Social connections:    Talks on phone: Not on file    Gets together: Not on file    Attends religious service: Not on file    Active member of club or organization: Not on file    Attends meetings of clubs or organizations: Not on file    Relationship status: Not on file  Other Topics Concern  . Not on file  Social History Narrative  . Not on file    Outpatient Encounter Medications as of 02/07/2018  Medication Sig  . albuterol (PROVENTIL HFA;VENTOLIN HFA) 108 (90 Base) MCG/ACT inhaler Inhale 2 puffs into the lungs every 6 (six) hours as needed for wheezing or shortness of breath.  Marland Kitchen amLODipine (NORVASC) 10 MG tablet TAKE 1 TABLET (10 MG TOTAL) BY MOUTH DAILY.  Marland Kitchen aspirin 81 MG tablet Take 81 mg by mouth daily.    . B-D INS SYRINGE 0.5CC/31GX5/16 31G X 5/16" 0.5 ML MISC USE AS DIRECTED 4 TIMES A DAY  . carvedilol (COREG) 25 MG tablet Take 1 tablet (25 mg total) by mouth 2 (two) times daily with a meal. -- Office visit needed for further refills  . cloNIDine (CATAPRES) 0.3 MG tablet Take 1 tablet (0.3 mg total) by mouth  2 (two) times daily.  . Insulin Pen Needle 31G X 5 MM MISC Use pen needles for insulin injection daily  . insulin regular (NOVOLIN R,HUMULIN R) 100 units/mL injection Inject 0.05-0.1 mLs (5-10 Units total)  into the skin 3 (three) times daily before meals. Relion. Please provide insulin syringes needed  . Insulin Syringe-Needle U-100 (B-D INS SYRINGE 0.5CC/30GX1/2") 30G X 1/2" 0.5 ML MISC USE AS DIRECTED 4 TIMES A DAY  . letrozole (FEMARA) 2.5 MG tablet TAKE 1 TABLET BY MOUTH EVERY DAY  . lisinopril-hydrochlorothiazide (PRINZIDE,ZESTORETIC) 10-12.5 MG tablet Take 1 tablet by mouth daily.  Marland Kitchen lovastatin (MEVACOR) 40 MG tablet Take 1 tablet (40 mg total) by mouth at bedtime.  . metFORMIN (GLUCOPHAGE) 500 MG tablet TAKE 2 TABLETS BY MOUTH DAILY WITH SUPPER.  . Na Sulfate-K Sulfate-Mg Sulf 17.5-3.13-1.6 GM/177ML SOLN Take 1 kit by mouth as directed.  Marland Kitchen OVER THE COUNTER MEDICATION Take 1 tablet by mouth daily as needed. Stool softner for constipation, also drinks warm prune juice prn  . Insulin Glargine (LANTUS SOLOSTAR) 100 UNIT/ML Solostar Pen Inject 24 units into the skin (Patient not taking: Reported on 02/07/2018)  . [DISCONTINUED] amoxicillin-clavulanate (AUGMENTIN) 875-125 MG tablet Take 1 tablet by mouth 2 (two) times daily.  . [DISCONTINUED] cloNIDine (CATAPRES) 0.3 MG tablet Take 0.3 mg by mouth 2 (two) times daily.  . [DISCONTINUED] cloNIDine (CATAPRES) 0.3 MG tablet Take 1 tablet (0.3 mg total) by mouth 2 (two) times daily. -- Office visit needed for further refills   No facility-administered encounter medications on file as of 02/07/2018.     Activities of Daily Living In your present state of health, do you have any difficulty performing the following activities: 02/07/2018 05/30/2017  Hearing? N N  Vision? N N  Difficulty concentrating or making decisions? N N  Walking or climbing stairs? N N  Dressing or bathing? N N  Doing errands, shopping? N -  Preparing Food and eating ? N -  Using  the Toilet? N -  In the past six months, have you accidently leaked urine? N -  Do you have problems with loss of bowel control? N -  Managing your Medications? N -  Managing your Finances? N -  Housekeeping or managing your Housekeeping? N -  Some recent data might be hidden    Patient Care Team: Marin Olp, MD as PCP - General (Family Medicine) Philemon Kingdom, MD as Consulting Physician (Internal Medicine) Jalene Mullet, MD as Consulting Physician (Ophthalmology) Erroll Luna, MD as Consulting Physician (General Surgery) Kyung Rudd, MD as Consulting Physician (Radiation Oncology) [name], MD as Consulting Physician (Hematology) Delice Bison Charlestine Massed, NP as Nurse Practitioner (Hematology and Oncology)    Assessment:   This is a routine wellness examination for Nyjah.  Exercise Activities and Dietary recommendations Current Exercise Habits: Home exercise routine, Type of exercise: walking, Frequency (Times/Week): 2, Intensity: Mild  Goals    . Blood Pressure < 140/90    . Exercise 150 min/wk Moderate Activity     Will start walking at least 2 days a week        Fall Risk Fall Risk  02/07/2018 07/25/2017 04/05/2017 11/20/2016 10/06/2016  Falls in the past year? Yes No - Yes No  Comment - - - - -  Number falls in past yr: 2 or more - 1 1 -  Injury with Fall? - - No No -  Risk for fall due to : - - History of fall(s);Impaired balance/gait History of fall(s);Impaired balance/gait -  Follow up Education provided - Falls evaluation completed;Falls prevention discussed Falls prevention discussed -     Depression Screen PHQ 2/9 Scores 02/07/2018 02/07/2018 07/25/2017 10/06/2016  PHQ - 2 Score 0 1 0 1  Getting ready to come to the doctor and putting on shoes Slid at toy on the floor  Cognitive Function MMSE - Mini Mental State Exam 02/07/2018 10/06/2016  Not completed: (No Data) -  Orientation to time - 5  Orientation to Place - 5  Registration - 3  Attention/  Calculation - 5  Recall - 3  Language- name 2 objects - 2  Language- repeat - 1  Language- follow 3 step command - 3  Language- read & follow direction - 1  Write a sentence - 1  Copy design - 1  Total score - 30   Ad8 score 0  Future dtr in law  does assist with managing her meds        Immunization History  Administered Date(s) Administered  . Influenza Split 07/06/2011, 10/25/2012  . Influenza Whole 07/06/2008, 08/30/2009, 09/15/2010  . Influenza, High Dose Seasonal PF 07/10/2017  . Influenza,inj,Quad PF,6+ Mos 07/03/2013, 06/16/2015, 10/06/2016  . Pneumococcal Conjugate-13 07/12/2015  . Pneumococcal Polysaccharide-23 08/03/1999, 12/12/2011  . Td 03/03/1999      Screening Tests Health Maintenance  Topic Date Due  . OPHTHALMOLOGY EXAM  01/14/2016  . TETANUS/TDAP  11/08/2018 (Originally 03/02/2009)  . HEMOGLOBIN A1C  02/26/2018  . INFLUENZA VACCINE  05/02/2018  . FOOT EXAM  10/10/2018  . LIPID PANEL  10/10/2018  . DEXA SCAN  Completed  . PNA vac Low Risk Adult  Completed         Plan:      PCP Notes   Health Maintenance Dexa 12/2015 -1.2 - (A lot going on this year; agreed to repeat next year)   Mammogram 07/2017 and followed at the  surgery center  Colonoscopy in process of scheduling per the patient's report  Eye exam - was going to Dr. Posey Pronto  Eyes have fluid in the back of eyes; blurred vision Manuela Schwartz to place referral   Volunteered today she has chewed tobacco since she was 80. Now is trying to chew gum since March and decided she would quit. Plans dental work later this year     Abnormal Screens  A1c followed by Dr. Cruzita Lederer  She can't afford lantus; Manuela Schwartz to fup with rep on getting free lantus as well as [employer] new program; Will outreach [place] tomorrow   Referrals  Eye exam  Patient concerns; As noted  Nurse Concerns; As noted  Next PCP apt Was seen today      I have personally reviewed and noted the following in the patient's  chart:   . Medical and social history . Use of alcohol, tobacco or illicit drugs  . Current medications and supplements . Functional ability and status . Nutritional status . Physical activity . Advanced directives . List of other physicians . Hospitalizations, surgeries, and ER visits in previous 12 months . Vitals . Screenings to include cognitive, depression, and falls . Referrals and appointments  In addition, I have reviewed and discussed with patient certain preventive protocols, quality metrics, and best practice recommendations. A written personalized care plan for preventive services as well as general preventive health recommendations were provided to patient.     [name], RN  02/07/2018

## 2018-02-07 ENCOUNTER — Encounter: Payer: Self-pay | Admitting: *Deleted

## 2018-02-07 ENCOUNTER — Ambulatory Visit (INDEPENDENT_AMBULATORY_CARE_PROVIDER_SITE_OTHER): Payer: Medicare Other | Admitting: *Deleted

## 2018-02-07 ENCOUNTER — Encounter: Payer: Self-pay | Admitting: Family Medicine

## 2018-02-07 ENCOUNTER — Ambulatory Visit (INDEPENDENT_AMBULATORY_CARE_PROVIDER_SITE_OTHER): Payer: Medicare Other | Admitting: Family Medicine

## 2018-02-07 VITALS — BP 136/72 | HR 68 | Temp 97.9°F | Ht 62.5 in | Wt 181.6 lb

## 2018-02-07 VITALS — BP 136/72 | HR 68 | Ht 62.0 in | Wt 181.0 lb

## 2018-02-07 DIAGNOSIS — Z0000 Encounter for general adult medical examination without abnormal findings: Secondary | ICD-10-CM | POA: Diagnosis not present

## 2018-02-07 DIAGNOSIS — K5900 Constipation, unspecified: Secondary | ICD-10-CM | POA: Diagnosis not present

## 2018-02-07 DIAGNOSIS — E113419 Type 2 diabetes mellitus with severe nonproliferative diabetic retinopathy with macular edema, unspecified eye: Secondary | ICD-10-CM | POA: Diagnosis not present

## 2018-02-07 DIAGNOSIS — I5032 Chronic diastolic (congestive) heart failure: Secondary | ICD-10-CM | POA: Diagnosis not present

## 2018-02-07 DIAGNOSIS — R358 Other polyuria: Secondary | ICD-10-CM | POA: Diagnosis not present

## 2018-02-07 DIAGNOSIS — E1165 Type 2 diabetes mellitus with hyperglycemia: Secondary | ICD-10-CM | POA: Diagnosis not present

## 2018-02-07 DIAGNOSIS — E113411 Type 2 diabetes mellitus with severe nonproliferative diabetic retinopathy with macular edema, right eye: Secondary | ICD-10-CM

## 2018-02-07 DIAGNOSIS — F17200 Nicotine dependence, unspecified, uncomplicated: Secondary | ICD-10-CM

## 2018-02-07 DIAGNOSIS — I10 Essential (primary) hypertension: Secondary | ICD-10-CM | POA: Diagnosis not present

## 2018-02-07 DIAGNOSIS — IMO0002 Reserved for concepts with insufficient information to code with codable children: Secondary | ICD-10-CM

## 2018-02-07 DIAGNOSIS — Z794 Long term (current) use of insulin: Secondary | ICD-10-CM

## 2018-02-07 DIAGNOSIS — R3589 Other polyuria: Secondary | ICD-10-CM

## 2018-02-07 LAB — POC URINALSYSI DIPSTICK (AUTOMATED)
BILIRUBIN UA: NEGATIVE
Blood, UA: NEGATIVE
GLUCOSE UA: NEGATIVE
KETONES UA: NEGATIVE
NITRITE UA: NEGATIVE
Spec Grav, UA: 1.01 (ref 1.010–1.025)
Urobilinogen, UA: 1 E.U./dL
pH, UA: 6 (ref 5.0–8.0)

## 2018-02-07 MED ORDER — CLONIDINE HCL 0.3 MG PO TABS: 0.3000 mg | ORAL_TABLET | Freq: Two times a day (BID) | ORAL | 2 refills | Status: DC

## 2018-02-07 NOTE — Addendum Note (Signed)
Addended by: Frutoso Chase A on: 02/07/2018 11:15 AM   Modules accepted: Orders

## 2018-02-07 NOTE — Assessment & Plan Note (Signed)
Trace edema. No recent weight gain. Compliant with HCTZ- amlodipine not ideal but helps with BP. Could use lasix if needed. She does get shortness of breath with walking around [employer]- I do wonder if this is just diastolic dysfunction in combo with deconditioning.

## 2018-02-07 NOTE — Patient Instructions (Addendum)
Please bring all medicines to each visit.   Can use miralax 1 capful up to 3 days in a row anytime you feel "backed up again"   Blood pressure looks good today  Please call today to get your eye exam set up  Please stop by lab before you go- urine tests only

## 2018-02-07 NOTE — Progress Notes (Signed)
I have reviewed and agree with note, evaluation, plan.  See my separate note from today  Neamiah Sciarra, MD  

## 2018-02-07 NOTE — Assessment & Plan Note (Signed)
S: Regular insulin has been much more affordable for her she says but then says she has been taking novolin. She is not taking her lantus as listed on Dr. Arman Filter last note- she does not know if she is taking tresiba. She did receive a letter that basaglar was covered.    A/P: I asked patient to bring all her medicines with her to next visit with Dr. Cruzita Lederer and actually all medicines to every visit (even if just the box for insulin)- hard to reinforce Dr. Arman Filter instructions when patient not sure what she is taking. Also strongly encouraged her to update her eye exam

## 2018-02-07 NOTE — Assessment & Plan Note (Signed)
S: controlled on lisinopril hctz 10-12.5 mg, amlodipine 10 mg, Coreg 25 mg twice daily, clonidine 0.3 mg twice daily BP Readings from Last 3 Encounters:  02/07/18 136/72  01/15/18 (!) 142/82  01/09/18 (!) 129/46  A/P: We discussed blood pressure goal of <140/90. Continue current meds

## 2018-02-07 NOTE — Assessment & Plan Note (Signed)
She is using nicotine gum as replacement. Not smoking or using snuff. Congratulated her

## 2018-02-07 NOTE — Assessment & Plan Note (Signed)
S: Patient was seen for abdominal pain about a month ago.  Without constipation could be the cause and we advised her to use MiraLAX.  We did a CT scan to rule out kidney stones as well as appendicitis for the right lower quadrant pain which was negative other than stool burden and some potential debris in the bladder.  She had a urinary tract infection previously which we treated with Augmentin.  She had large BMs on miralax and realized she was "bound up" A/P: we discussed using prn miralax- luckily no severe intraabdominal pathology

## 2018-02-07 NOTE — Patient Instructions (Addendum)
Wendy [name] , Thank you for taking time to come for your Medicare Wellness Visit. I appreciate your ongoing commitment to your health goals. Please review the following plan we discussed and let me know if I can assist you in the future.   Will recheck you bone density next year  Mammogram underway at the surgical center   Dr. Yong Channel to fup regarding eye exam   Shingrix is a vaccine for the prevention of Shingles in Adults 50 and older.  If you are on Medicare, the shingrix is covered under your Part D plan, so you will take both of the vaccines in the series at your pharmacy. Please check with your benefits regarding applicable copays or out of pocket expenses.  The Shingrix is given in 2 vaccines approx 8 weeks apart. You must receive the 2nd dose prior to 6 months from receipt of the first. Please have the pharmacist print out you Immunization  dates for our office records   Will make a dental apt    These are the goals we discussed: Goals    . Blood Pressure < 140/90       This is a list of the screening recommended for you and due dates:  Health Maintenance  Topic Date Due  . Eye exam for diabetics  01/14/2016  . Tetanus Vaccine  11/08/2018*  . Hemoglobin A1C  02/26/2018  . Flu Shot  05/02/2018  . Complete foot exam   10/10/2018  . Lipid (cholesterol) test  10/10/2018  . DEXA scan (bone density measurement)  Completed  . Pneumonia vaccines  Completed  *Topic was postponed. The date shown is not the original due date.      Colonoscopy, Adult A colonoscopy is an exam to look at the entire large intestine. During the exam, a lubricated, bendable tube is inserted into the anus and then passed into the rectum, colon, and other parts of the large intestine. A colonoscopy is often done as a part of normal colorectal screening or in response to certain symptoms, such as anemia, persistent diarrhea, abdominal pain, and blood in the stool. The exam can help screen for and diagnose  medical problems, including:  Tumors.  Polyps.  Inflammation.  Areas of bleeding.  Tell a health care provider about:  Any allergies you have.  All medicines you are taking, including vitamins, herbs, eye drops, creams, and over-the-counter medicines.  Any problems you or family members have had with anesthetic medicines.  Any blood disorders you have.  Any surgeries you have had.  Any medical conditions you have.  Any problems you have had passing stool. What are the risks? Generally, this is a safe procedure. However, problems may occur, including:  Bleeding.  A tear in the intestine.  A reaction to medicines given during the exam.  Infection (rare).  What happens before the procedure? Eating and drinking restrictions Follow instructions from your health care provider about eating and drinking, which may include:  A few days before the procedure - follow a low-fiber diet. Avoid nuts, seeds, dried fruit, raw fruits, and vegetables.  1-3 days before the procedure - follow a clear liquid diet. Drink only clear liquids, such as clear broth or bouillon, black coffee or tea, clear juice, clear soft drinks or sports drinks, gelatin dessert, and popsicles. Avoid any liquids that contain red or purple dye.  On the day of the procedure - do not eat or drink anything during the 2 hours before the procedure, or within the  time period that your health care provider recommends.  Bowel prep If you were prescribed an oral bowel prep to clean out your colon:  Take it as told by your health care provider. Starting the day before your procedure, you will need to drink a large amount of medicated liquid. The liquid will cause you to have multiple loose stools until your stool is almost clear or light green.  If your skin or anus gets irritated from diarrhea, you may use these to relieve the irritation: ? Medicated wipes, such as adult wet wipes with aloe and vitamin E. ? A skin  soothing-product like petroleum jelly.  If you vomit while drinking the bowel prep, take a break for up to 60 minutes and then begin the bowel prep again. If vomiting continues and you cannot take the bowel prep without vomiting, call your health care provider.  General instructions  Ask your health care provider about changing or stopping your regular medicines. This is especially important if you are taking diabetes medicines or blood thinners.  Plan to have someone take you home from the hospital or clinic. What happens during the procedure?  An IV tube may be inserted into one of your veins.  You will be given medicine to help you relax (sedative).  To reduce your risk of infection: ? Your health care team will wash or sanitize their hands. ? Your anal area will be washed with soap.  You will be asked to lie on your side with your knees bent.  Your health care provider will lubricate a long, thin, flexible tube. The tube will have a camera and a light on the end.  The tube will be inserted into your anus.  The tube will be gently eased through your rectum and colon.  Air will be delivered into your colon to keep it open. You may feel some pressure or cramping.  The camera will be used to take images during the procedure.  A small tissue sample may be removed from your body to be examined under a microscope (biopsy). If any potential problems are found, the tissue will be sent to a lab for testing.  If small polyps are found, your health care provider may remove them and have them checked for cancer cells.  The tube that was inserted into your anus will be slowly removed. The procedure may vary among health care providers and hospitals. What happens after the procedure?  Your blood pressure, heart rate, breathing rate, and blood oxygen level will be monitored until the medicines you were given have worn off.  Do not drive for 24 hours after the exam.  You may have a  small amount of blood in your stool.  You may pass gas and have mild abdominal cramping or bloating due to the air that was used to inflate your colon during the exam.  It is up to you to get the results of your procedure. Ask your health care provider, or the department performing the procedure, when your results will be ready. This information is not intended to replace advice given to you by your health care provider. Make sure you discuss any questions you have with your health care provider. Document Released: 09/15/2000 Document Revised: 07/19/2016 Document Reviewed: 11/30/2015 Elsevier Interactive Patient Education  2018 [place] Prevention in the [place] can cause injuries. They can happen to people of all ages. There are many things you can do to make your home safe and  to help prevent falls. What can I do on the outside of my home?  Regularly fix the edges of walkways and driveways and fix any cracks.  Remove anything that might make you trip as you walk through a door, such as a raised step or threshold.  Trim any bushes or trees on the path to your home.  Use bright outdoor lighting.  Clear any walking paths of anything that might make someone trip, such as rocks or tools.  Regularly check to see if handrails are loose or broken. Make sure that both sides of any steps have handrails.  Any raised decks and porches should have guardrails on the edges.  Have any leaves, snow, or ice cleared regularly.  Use sand or salt on walking paths during winter.  Clean up any spills in your garage right away. This includes oil or grease spills. What can I do in the bathroom?  Use night lights.  Install grab bars by the toilet and in the tub and shower. Do not use towel bars as grab bars.  Use non-skid mats or decals in the tub or shower.  If you need to sit down in the shower, use a plastic, non-slip stool.  Keep the floor dry. Clean up any water that spills on  the floor as soon as it happens.  Remove soap buildup in the tub or shower regularly.  Attach bath mats securely with double-sided non-slip rug tape.  Do not have throw rugs and other things on the floor that can make you trip. What can I do in the bedroom?  Use night lights.  Make sure that you have a light by your bed that is easy to reach.  Do not use any sheets or blankets that are too big for your bed. They should not hang down onto the floor.  Have a firm chair that has side arms. You can use this for support while you get dressed.  Do not have throw rugs and other things on the floor that can make you trip. What can I do in the kitchen?  Clean up any spills right away.  Avoid walking on wet floors.  Keep items that you use a lot in easy-to-reach places.  If you need to reach something above you, use a strong step stool that has a grab bar.  Keep electrical cords out of the way.  Do not use floor polish or wax that makes floors slippery. If you must use wax, use non-skid floor wax.  Do not have throw rugs and other things on the floor that can make you trip. What can I do with my stairs?  Do not leave any items on the stairs.  Make sure that there are handrails on both sides of the stairs and use them. Fix handrails that are broken or loose. Make sure that handrails are as long as the stairways.  Check any carpeting to make sure that it is firmly attached to the stairs. Fix any carpet that is loose or worn.  Avoid having throw rugs at the top or bottom of the stairs. If you do have throw rugs, attach them to the floor with carpet tape.  Make sure that you have a light switch at the top of the stairs and the bottom of the stairs. If you do not have them, ask someone to add them for you. What else can I do to help prevent falls?  Wear shoes that: ? Do not have high heels. ?  Have rubber bottoms. ? Are comfortable and fit you well. ? Are closed at the toe. Do not  wear sandals.  If you use a stepladder: ? Make sure that it is fully opened. Do not climb a closed stepladder. ? Make sure that both sides of the stepladder are locked into place. ? Ask someone to hold it for you, if possible.  Clearly mark and make sure that you can see: ? Any grab bars or handrails. ? First and last steps. ? Where the edge of each step is.  Use tools that help you move around (mobility aids) if they are needed. These include: ? Canes. ? Walkers. ? Scooters. ? Crutches.  Turn on the lights when you go into a dark area. Replace any light bulbs as soon as they burn out.  Set up your furniture so you have a clear path. Avoid moving your furniture around.  If any of your floors are uneven, fix them.  If there are any pets around you, be aware of where they are.  Review your medicines with your doctor. Some medicines can make you feel dizzy. This can increase your chance of falling. Ask your doctor what other things that you can do to help prevent falls. This information is not intended to replace advice given to you by your health care provider. Make sure you discuss any questions you have with your health care provider. Document Released: 07/15/2009 Document Revised: 02/24/2016 Document Reviewed: 10/23/2014 Elsevier Interactive Patient Education  2018 [place] Maintenance, Female Adopting a healthy lifestyle and getting preventive care can go a long way to promote health and wellness. Talk with your health care provider about what schedule of regular examinations is right for you. This is a good chance for you to check in with your provider about disease prevention and staying healthy. In between checkups, there are plenty of things you can do on your own. Experts have done a lot of research about which lifestyle changes and preventive measures are most likely to keep you healthy. Ask your health care provider for more information. Weight and diet Eat  a healthy diet  Be sure to include plenty of vegetables, fruits, low-fat dairy products, and lean protein.  Do not eat a lot of foods high in solid fats, added sugars, or salt.  Get regular exercise. This is one of the most important things you can do for your health. ? Most adults should exercise for at least 150 minutes each week. The exercise should increase your heart rate and make you sweat (moderate-intensity exercise). ? Most adults should also do strengthening exercises at least twice a week. This is in addition to the moderate-intensity exercise.  Maintain a healthy weight  Body mass index (BMI) is a measurement that can be used to identify possible weight problems. It estimates body fat based on height and weight. Your health care provider can help determine your BMI and help you achieve or maintain a healthy weight.  For females 54 years of age and older: ? A BMI below 18.5 is considered underweight. ? A BMI of 18.5 to 24.9 is normal. ? A BMI of 25 to 29.9 is considered overweight. ? A BMI of 30 and above is considered obese.  Watch levels of cholesterol and blood lipids  You should start having your blood tested for lipids and cholesterol at 77 years of age, then have this test every 5 years.  You may need to have your cholesterol levels checked  more often if: ? Your lipid or cholesterol levels are high. ? You are older than 77 years of age. ? You are at high risk for heart disease.  Cancer screening Lung Cancer  Lung cancer screening is recommended for adults 48-63 years old who are at high risk for lung cancer because of a history of smoking.  A yearly low-dose CT scan of the lungs is recommended for people who: ? Currently smoke. ? Have quit within the past 15 years. ? Have at least a 30-pack-year history of smoking. A pack year is smoking an average of one pack of cigarettes a day for 1 year.  Yearly screening should continue until it has been 15 years since you  quit.  Yearly screening should stop if you develop a health problem that would prevent you from having lung cancer treatment.  Breast Cancer  Practice breast self-awareness. This means understanding how your breasts normally appear and feel.  It also means doing regular breast self-exams. Let your health care provider know about any changes, no matter how small.  If you are in your 20s or 30s, you should have a clinical breast exam (CBE) by a health care provider every 1-3 years as part of a regular health exam.  If you are 41 or older, have a CBE every year. Also consider having a breast X-ray (mammogram) every year.  If you have a family history of breast cancer, talk to your health care provider about genetic screening.  If you are at high risk for breast cancer, talk to your health care provider about having an MRI and a mammogram every year.  Breast cancer gene (BRCA) assessment is recommended for women who have family members with BRCA-related cancers. BRCA-related cancers include: ? Breast. ? Ovarian. ? Tubal. ? Peritoneal cancers.  Results of the assessment will determine the need for genetic counseling and BRCA1 and BRCA2 testing.  Cervical Cancer Your health care provider may recommend that you be screened regularly for cancer of the pelvic organs (ovaries, uterus, and vagina). This screening involves a pelvic examination, including checking for microscopic changes to the surface of your cervix (Pap test). You may be encouraged to have this screening done every 3 years, beginning at age 85.  For women ages 6-65, health care providers may recommend pelvic exams and Pap testing every 3 years, or they may recommend the Pap and pelvic exam, combined with testing for human papilloma virus (HPV), every 5 years. Some types of HPV increase your risk of cervical cancer. Testing for HPV may also be done on women of any age with unclear Pap test results.  Other health care providers  may not recommend any screening for nonpregnant women who are considered low risk for pelvic cancer and who do not have symptoms. Ask your health care provider if a screening pelvic exam is right for you.  If you have had past treatment for cervical cancer or a condition that could lead to cancer, you need Pap tests and screening for cancer for at least 20 years after your treatment. If Pap tests have been discontinued, your risk factors (such as having a new sexual partner) need to be reassessed to determine if screening should resume. Some women have medical problems that increase the chance of getting cervical cancer. In these cases, your health care provider may recommend more frequent screening and Pap tests.  Colorectal Cancer  This type of cancer can be detected and often prevented.  Routine colorectal cancer screening usually  begins at 77 years of age and continues through 77 years of age.  Your health care provider may recommend screening at an earlier age if you have risk factors for colon cancer.  Your health care provider may also recommend using home test kits to check for hidden blood in the stool.  A small camera at the end of a tube can be used to examine your colon directly (sigmoidoscopy or colonoscopy). This is done to check for the earliest forms of colorectal cancer.  Routine screening usually begins at age 47.  Direct examination of the colon should be repeated every 5-10 years through 77 years of age. However, you may need to be screened more often if early forms of precancerous polyps or small growths are found.  Skin Cancer  Check your skin from head to toe regularly.  Tell your health care provider about any new moles or changes in moles, especially if there is a change in a mole's shape or color.  Also tell your health care provider if you have a mole that is larger than the size of a pencil eraser.  Always use sunscreen. Apply sunscreen liberally and repeatedly  throughout the day.  Protect yourself by wearing long sleeves, pants, a wide-brimmed hat, and sunglasses whenever you are outside.  Heart disease, diabetes, and high blood pressure  High blood pressure causes heart disease and increases the risk of stroke. High blood pressure is more likely to develop in: ? People who have blood pressure in the high end of the normal range (130-139/85-89 mm Hg). ? People who are overweight or obese. ? People who are African American.  If you are 84-7 years of age, have your blood pressure checked every 3-5 years. If you are 37 years of age or older, have your blood pressure checked every year. You should have your blood pressure measured twice-once when you are at a hospital or clinic, and once when you are not at a hospital or clinic. Record the average of the two measurements. To check your blood pressure when you are not at a hospital or clinic, you can use: ? An automated blood pressure machine at a pharmacy. ? A home blood pressure monitor.  If you are between 9 years and 64 years old, ask your health care provider if you should take aspirin to prevent strokes.  Have regular diabetes screenings. This involves taking a blood sample to check your fasting blood sugar level. ? If you are at a normal weight and have a low risk for diabetes, have this test once every three years after 77 years of age. ? If you are overweight and have a high risk for diabetes, consider being tested at a younger age or more often. Preventing infection Hepatitis B  If you have a higher risk for hepatitis B, you should be screened for this virus. You are considered at high risk for hepatitis B if: ? You were born in a country where hepatitis B is common. Ask your health care provider which countries are considered high risk. ? Your parents were born in a high-risk country, and you have not been immunized against hepatitis B (hepatitis B vaccine). ? You have HIV or AIDS. ? You  use needles to inject street drugs. ? You live with someone who has hepatitis B. ? You have had sex with someone who has hepatitis B. ? You get hemodialysis treatment. ? You take certain medicines for conditions, including cancer, organ transplantation, and autoimmune conditions.  Hepatitis C  Blood testing is recommended for: ? Everyone born from [birth year] through 1965. ? Anyone with known risk factors for hepatitis C.  Sexually transmitted infections (STIs)  You should be screened for sexually transmitted infections (STIs) including gonorrhea and chlamydia if: ? You are sexually active and are younger than 77 years of age. ? You are older than 77 years of age and your health care provider tells you that you are at risk for this type of infection. ? Your sexual activity has changed since you were last screened and you are at an increased risk for chlamydia or gonorrhea. Ask your health care provider if you are at risk.  If you do not have HIV, but are at risk, it may be recommended that you take a prescription medicine daily to prevent HIV infection. This is called pre-exposure prophylaxis (PrEP). You are considered at risk if: ? You are sexually active and do not regularly use condoms or know the HIV status of your partner(s). ? You take drugs by injection. ? You are sexually active with a partner who has HIV.  Talk with your health care provider about whether you are at high risk of being infected with HIV. If you choose to begin PrEP, you should first be tested for HIV. You should then be tested every 3 months for as long as you are taking PrEP. Pregnancy  If you are premenopausal and you may become pregnant, ask your health care provider about preconception counseling.  If you may become pregnant, take 400 to 800 micrograms (mcg) of folic acid every day.  If you want to prevent pregnancy, talk to your health care provider about birth control (contraception). Osteoporosis and  menopause  Osteoporosis is a disease in which the bones lose minerals and strength with aging. This can result in serious bone fractures. Your risk for osteoporosis can be identified using a bone density scan.  If you are 56 years of age or older, or if you are at risk for osteoporosis and fractures, ask your health care provider if you should be screened.  Ask your health care provider whether you should take a calcium or vitamin D supplement to lower your risk for osteoporosis.  Menopause may have certain physical symptoms and risks.  Hormone replacement therapy may reduce some of these symptoms and risks. Talk to your health care provider about whether hormone replacement therapy is right for you. Follow these instructions at home:  Schedule regular health, dental, and eye exams.  Stay current with your immunizations.  Do not use any tobacco products including cigarettes, chewing tobacco, or electronic cigarettes.  If you are pregnant, do not drink alcohol.  If you are breastfeeding, limit how much and how often you drink alcohol.  Limit alcohol intake to no more than 1 drink per day for nonpregnant women. One drink equals 12 ounces of beer, 5 ounces of wine, or 1 ounces of hard liquor.  Do not use street drugs.  Do not share needles.  Ask your health care provider for help if you need support or information about quitting drugs.  Tell your health care provider if you often feel depressed.  Tell your health care provider if you have ever been abused or do not feel safe at home. This information is not intended to replace advice given to you by your health care provider. Make sure you discuss any questions you have with your health care provider. Document Released: 04/03/2011 Document Revised: 02/24/2016 Document Reviewed: 06/22/2015  Chartered certified accountant Patient Education  [employer].

## 2018-02-07 NOTE — Progress Notes (Signed)
Subjective:  Wendy [name] is a 77 y.o. year old very pleasant female patient who presents for/with See problem oriented charting ROS- some polyuria. Some edema. No chest pain. Stable shortness of breath.    Past Medical History-  Patient Active Problem List   Diagnosis Date Noted  . Malignant neoplasm of upper-outer quadrant of left breast in female, estrogen receptor positive ([place]) 11/20/2016    Priority: High  . 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 Chronic diastolic heart failure ([place]) 07/06/2011    Priority: High  . Rectal bleeding 01/17/2017    Priority: Medium  . CKD (chronic kidney disease) stage 3, GFR 30-59 ml/min (HCC) 03/17/2016    Priority: Medium  . Osteopenia 01/05/2016    Priority: Medium  . TOBACCO USE 04/13/2008    Priority: Medium  . Hyperlipidemia 11/29/2006    Priority: Medium  . OBESITY, BMI 30-35 11/29/2006    Priority: Medium  . HYPERTENSION, BENIGN SYSTEMIC 11/29/2006    Priority: Medium  . Fatigue 03/14/2017    Priority: Low  . Edema 03/14/2017    Priority: Low  . Port catheter in place 01/18/2017    Priority: Low  . Constipation 01/17/2017    Priority: Low  . Seasonal allergies 12/01/2013    Priority: Low  . Anemia 01/09/2018    Medications- reviewed and updated Current Outpatient Medications  Medication Sig Dispense Refill  . albuterol (PROVENTIL HFA;VENTOLIN HFA) 108 (90 Base) MCG/ACT inhaler Inhale 2 puffs into the lungs every 6 (six) hours as needed for wheezing or shortness of breath. 1 Inhaler 2  . amLODipine (NORVASC) 10 MG tablet TAKE 1 TABLET (10 MG TOTAL) BY MOUTH DAILY. 90 tablet 1  . aspirin 81 MG tablet Take 81 mg by mouth daily.      . B-D INS SYRINGE 0.5CC/31GX5/16 31G X 5/16" 0.5 ML MISC USE AS DIRECTED 4 TIMES A DAY 300 each 1  . carvedilol (COREG) 25 MG tablet Take 1 tablet (25 mg total) by  mouth 2 (two) times daily with a meal. -- Office visit needed for further refills 60 tablet 0  . cloNIDine (CATAPRES) 0.3 MG tablet Take 1 tablet (0.3 mg total) by mouth 2 (two) times daily. 180 tablet 2  . Insulin Glargine (LANTUS SOLOSTAR) 100 UNIT/ML Solostar Pen Inject 24 units into the skin 10 pen 5  . Insulin Pen Needle 31G X 5 MM MISC Use pen needles for insulin injection daily 100 each 3  . insulin regular (NOVOLIN R,HUMULIN R) 100 units/mL injection Inject 0.05-0.1 mLs (5-10 Units total) into the skin 3 (three) times daily before meals. Relion. Please provide insulin syringes needed 10 mL 11  . Insulin Syringe-Needle U-100 (B-D INS SYRINGE 0.5CC/30GX1/2") 30G X 1/2" 0.5 ML MISC USE AS DIRECTED 4 TIMES A DAY 300 each 3  . letrozole (FEMARA) 2.5 MG tablet TAKE 1 TABLET BY MOUTH EVERY DAY 30 tablet 2  . lisinopril-hydrochlorothiazide (PRINZIDE,ZESTORETIC) 10-12.5 MG tablet Take 1 tablet by mouth daily. 90 tablet 3  . lovastatin (MEVACOR) 40 MG tablet Take 1 tablet (40 mg total) by mouth at bedtime. 90 tablet 3  . metFORMIN (GLUCOPHAGE) 500 MG tablet TAKE 2 TABLETS BY MOUTH DAILY WITH SUPPER. 180 tablet 0  . Na Sulfate-K Sulfate-Mg Sulf 17.5-3.13-1.6 GM/177ML SOLN Take 1 kit by mouth as directed. 354 mL 0  . OVER THE COUNTER MEDICATION Take 1 tablet by  mouth daily as needed. Stool softner for constipation, also drinks warm prune juice prn     No current facility-administered medications for this visit.     Objective: BP 136/72 (BP Location: Left Arm, Patient Position: Sitting, Cuff Size: Large)   Pulse 68   Temp 97.9 F (36.6 C)   Ht 5' 2.5" (1.588 m)   Wt 181 lb 9.6 oz (82.4 kg)   SpO2 95%   BMI 32.69 kg/m  Gen: NAD, resting comfortably CV: RRR no murmurs rubs or gallops Lungs: CTAB no crackles, wheeze, rhonchi Abdomen: soft/nontender/nondistended/normal bowel sounds. No rebound or guarding.  Ext: no edema Skin: warm, dry Neuro: normal speech- does have a hard time remembering  medicines  Assessment/Plan:  Polyuria S: still having some frequency of urination. Had some debris in bladder from CT scan A/P: update UA and culture   HYPERTENSION, BENIGN SYSTEMIC S: controlled on lisinopril hctz 10-12.5 mg, amlodipine 10 mg, Coreg 25 mg twice daily, clonidine 0.3 mg twice daily BP Readings from Last 3 Encounters:  02/07/18 136/72  01/15/18 (!) 142/82  01/09/18 (!) 129/46  A/P: We discussed blood pressure goal of <140/90. Continue current meds  Constipation S: Patient was seen for abdominal pain about a month ago.  Without constipation could be the cause and we advised her to use MiraLAX.  We did a CT scan to rule out kidney stones as well as appendicitis for the right lower quadrant pain which was negative other than stool burden and some potential debris in the bladder.  She had a urinary tract infection previously which we treated with Augmentin.  She had large BMs on miralax and realized she was "bound up" A/P: we discussed using prn miralax- luckily no severe intraabdominal pathology  TOBACCO USE She is using nicotine gum as replacement. Not smoking or using snuff. 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 She is not taking her lantus as listed on Dr. Arman Filter last note- she does not know if she is taking tresiba. She did receive a letter that basaglar was covered.    A/P: I asked patient to bring all her medicines with her to next visit with Dr. Cruzita Lederer and actually all medicines to every visit (even if just the box for insulin)- hard to reinforce Dr. Arman Filter instructions when patient not sure what she is taking. Also strongly encouraged her to update her eye exam  Chronic diastolic heart failure Trace edema. No recent weight gain.  Compliant with HCTZ- amlodipine not ideal but helps with BP. Could use lasix if needed. She does get shortness of breath with walking around [employer]- I do wonder if this is just diastolic dysfunction in combo with deconditioning.      Future Appointments  Date Time Provider [place]  02/27/2018 11:30 AM Philemon Kingdom, MD [facility] None  05/08/2018 10:15 AM [facility] 1 [facility] None  05/08/2018 10:45 AM [name], MD [facility] None   No follow-ups on file.  Lab/Order associations: Polyuria - Plan: POCT Urinalysis Dipstick (Automated), Urine Culture  HYPERTENSION- Plan: cloNIDine (CATAPRES) 0.3 MG tablet  Meds ordered this encounter  Medications  . cloNIDine (CATAPRES) 0.3 MG tablet    Sig: Take 1 tablet (0.3 mg total) by mouth 2 (two) times daily.    Dispense:  180 tablet  Refill:  2    Return precautions advised.  Garret Reddish, MD

## 2018-02-08 ENCOUNTER — Telehealth: Payer: Self-pay

## 2018-02-08 NOTE — Telephone Encounter (Signed)
Ms Srey in for [place] on 02/08/2018  Stated she could not afford her lantus and is therefore not taking it. Was told by pharmacy rep that [facility] Le[facility] is making an effort to assist patients in getting their insulin if they are unable to.  Was told [facility] Leyden will offer solutions. Can call [phone].  Left VM for Ms. Shamoon to call me at [phone] but did give her the number   Asked her to call my direct line at [phone] for questions, Tuesday, wed and Friday.  Vivianne Master

## 2018-02-08 NOTE — Telephone Encounter (Signed)
Called patient and left a message to call office back.  

## 2018-02-09 LAB — URINE CULTURE
MICRO NUMBER:: 90567658
SPECIMEN QUALITY: ADEQUATE

## 2018-02-13 NOTE — Telephone Encounter (Signed)
Noted. Thank you Susan.

## 2018-02-19 NOTE — Telephone Encounter (Signed)
Call to the patient and she stated that the 800 number was not helpful for her.  I called and left a message for the representative , Dara at [phone]

## 2018-02-26 NOTE — Telephone Encounter (Signed)
Spoke with [name] and Dietitian.  Fup call placed to [employer] patient assistance. Application pulled for lantus "online" and confirmed that it was current.  Call placed to [name] who states she still can't afford her lantus. To see Dr. Cruzita Lederer tomorrow but will send her the form to complete. Also gave her the number to call today to inquire as to their eligibility to see if she can get assistance.  [name] RN

## 2018-02-27 ENCOUNTER — Ambulatory Visit (INDEPENDENT_AMBULATORY_CARE_PROVIDER_SITE_OTHER): Payer: Medicare Other | Admitting: Internal Medicine

## 2018-02-27 ENCOUNTER — Encounter: Payer: Self-pay | Admitting: Internal Medicine

## 2018-02-27 VITALS — BP 106/64 | HR 71 | Ht 62.0 in | Wt 181.0 lb

## 2018-02-27 DIAGNOSIS — E113419 Type 2 diabetes mellitus with severe nonproliferative diabetic retinopathy with macular edema, unspecified eye: Secondary | ICD-10-CM

## 2018-02-27 DIAGNOSIS — E1165 Type 2 diabetes mellitus with hyperglycemia: Secondary | ICD-10-CM | POA: Diagnosis not present

## 2018-02-27 DIAGNOSIS — E785 Hyperlipidemia, unspecified: Secondary | ICD-10-CM | POA: Diagnosis not present

## 2018-02-27 DIAGNOSIS — Z794 Long term (current) use of insulin: Secondary | ICD-10-CM

## 2018-02-27 DIAGNOSIS — IMO0002 Reserved for concepts with insufficient information to code with codable children: Secondary | ICD-10-CM

## 2018-02-27 LAB — POCT GLYCOSYLATED HEMOGLOBIN (HGB A1C): Hemoglobin A1C: 9.3 % — AB (ref 4.0–5.6)

## 2018-02-27 NOTE — Telephone Encounter (Signed)
Wendy [name] thank you for trying so hard to help patient.  I really appreciate it

## 2018-02-27 NOTE — Patient Instructions (Addendum)
Please continue: - Metformin 1000 mg 2x a day with meals - Lantus/Tresiba 24 units but move this at night  Please increase: - Humalog before a meal: - 8 units with a smaller meal - 10 units with a regular meal - 12 units with a large meal/if you eat out/if you have dessert  Let me know if you cannot obtain the insulins.  Please return on July 16 at 1:15 pm.

## 2018-02-27 NOTE — Progress Notes (Signed)
Patient ID: Wendy [name], female   DOB: 14-Jun-1941, 77 y.o.   MRN: [id number]  HPI: Wendy [name] is a 77 y.o.-year-old female, returning for f/u for DM2, dx in 93s, insulin-dependent since 1990, uncontrolled, with complications (CKD, DR). Last visit 3 months ago.  Last hemoglobin A1c was: Lab Results  Component Value Date   HGBA1C 9.2 11/29/2017   HGBA1C 8.4 07/26/2017   HGBA1C 7.4 04/25/2017    Pt is on: - Metformin 1000 mg 2x a day with meals - Basaglar >> Lantus 24 units in a.m. - 149$ per box >> she applied for financial assistance to get this free - hears from them in 3 days - Humalog samples (did not get regular insulin as advised at last visit) tion: still does not follow the instructions below despite multiple promptings! Still taking 6-7 units per meal!  Try to skip the snack at night, if sugars >100.  Pt checks her sugars 3X a day-no log, no meter: - am:  70, 170-200s, 270 >> 120-187 (snack) >> 177-265 - 2h after b'fast: n/c >> 116-270 >> n/c - before lunch: 128-171, 235 >> 140s >> 70 -160 >> 113, 148 - 2h after lunch: n/c >> 269 >> n/c - before dinner:  210, 244 >> 200s >> 200 >> 81, 170-275 - 2h after dinner:  120-174 >> 135 >> n/c - bedtime:  198, 322 >> 200s >> 179-260 >> 304-441 (sweets) - nighttime: 1244-290 >> n/c >> 73 x1, 130 >> n/c Lowest sugar was 32 >> ... >> 70 >> 81; she has hypoglycemia awareness in the 70s. Highest sugar was 300s x 2 >> 441.  Glucometer: [employer]  Pt's meals are: - Breakfast: toast, egg, bacon/sausage; oatmeal - Lunch: [place] sandwich, soups, crackers - Dinner: baked chicken, potatoes, cabbage (dinner is her largest meal) - Snacks: PB and crackers  -+ Mild CKD, last BUN/creatinine:  Lab Results  Component Value Date   BUN 18 01/09/2018   CREATININE 1.10 01/09/2018  On lisinopril 10. -+ HL; last set of lipids: Lab Results  Component Value Date   CHOL 206 (H) 10/10/2017   HDL 65.20 10/10/2017   LDLCALC 119 (H) 10/10/2017    LDLDIRECT 91 10/25/2012   TRIG 113.0 10/10/2017   CHOLHDL 3 10/10/2017  On lovastatin 40. 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 Posey Pronto).  She had laser surgery.She usually sees Dr. Kathlen Mody.   -+ Neuropathic pain in feet On ASA 81.  She has breast cancer and had left breast lumpectomy + chemotherapy for 4 weeks + radiotherapy.    ROS: Constitutional: no weight gain/no weight loss, no fatigue, no subjective hyperthermia, no subjective hypothermia Eyes: no blurry vision, no xerophthalmia ENT: no sore throat, no nodules palpated in throat, no dysphagia, no odynophagia, no hoarseness Cardiovascular: no CP/no SOB/no palpitations/no leg swelling Respiratory: no cough/no SOB/no wheezing Gastrointestinal: no N/no V/no D/no C/no acid reflux Musculoskeletal: no muscle aches/no joint aches Skin: no rashes, no hair loss Neurological: no tremors/+ numbness/+ tingling/no dizziness  I reviewed pt's medications, allergies, PMH, social hx, family hx, and changes were documented in the history of present illness. Otherwise, unchanged from my initial visit note.   Past Medical History:  Diagnosis Date  . Abnormal breast finding 2018   per pt/ having  a lot drainage from left breast nipple  . Breast cancer ([place]) 11/2016   left/  . GERD (gastroesophageal reflux disease)    TUMS as needed  . HTN (hypertension)  states BP has been high recently; has been on med. x 20 yr.  . Hyperlipidemia   . Insulin dependent diabetes mellitus ([place])   . Personal history of chemotherapy    2018/finished 6 weeks of chemo in Sep 2018  . Personal history of radiation therapy    2018 left breast/finished radiation in Sept 2018 per pt.   Past Surgical History:  Procedure Laterality Date  . BREAST LUMPECTOMY Left 05/05/2008  . BREAST LUMPECTOMY Left 12/07/2016   malignant  . BREAST LUMPECTOMY WITH RADIOACTIVE SEED AND SENTINEL LYMPH NODE BIOPSY Left 12/07/2016   Procedure: LEFT BREAST  LUMPECTOMY WITH RADIOACTIVE SEED AND SENTINEL LYMPH NODE BIOPSY;  Surgeon: Erroll Luna, MD;  Location: [facility];  Service: General;  Laterality: Left;  . IR FLUORO GUIDE PORT INSERTION RIGHT  01/04/2017  . IR US GUIDE VASC ACCESS RIGHT  01/04/2017  . PORT-A-CATH REMOVAL N/A 05/30/2017   Procedure: REMOVAL PORT-A-CATH;  Surgeon: Erroll Luna, MD;  Location: [facility];  Service: General;  Laterality: N/A;  . RE-EXCISION OF BREAST LUMPECTOMY Left 12/28/2016   Procedure: RE-EXCISION OF BREAST LUMPECTOMY;  Surgeon: Erroll Luna, MD;  Location: [facility];  Service: General;  Laterality: Left;   Social History   Social History  . Marital Status: Married    Spouse Name: N/A  . Number of Children: 4   . retired    Social History Main Topics  . Smoking status: Never Smoker   . Smokeless tobacco: Former Systems developer    Types: Snuff    Quit date: 10/02/2014     Comment: Quit 10/02/2014  previous 1 box per week of snuff   . Alcohol Use: No  . Drug Use: No   Current Outpatient Medications on File Prior to Visit  Medication Sig Dispense Refill  . albuterol (PROVENTIL HFA;VENTOLIN HFA) 108 (90 Base) MCG/ACT inhaler Inhale 2 puffs into the lungs every 6 (six) hours as needed for wheezing or shortness of breath. 1 Inhaler 2  . amLODipine (NORVASC) 10 MG tablet TAKE 1 TABLET (10 MG TOTAL) BY MOUTH DAILY. 90 tablet 1  . aspirin 81 MG tablet Take 81 mg by mouth daily.      . B-D INS SYRINGE 0.5CC/31GX5/16 31G X 5/16" 0.5 ML MISC USE AS DIRECTED 4 TIMES A DAY 300 each 1  . carvedilol (COREG) 25 MG tablet Take 1 tablet (25 mg total) by mouth 2 (two) times daily with a meal. -- Office visit needed for further refills 60 tablet 0  . cloNIDine (CATAPRES) 0.3 MG tablet Take 1 tablet (0.3 mg total) by mouth 2 (two) times daily. 180 tablet 2  . Insulin Glargine (LANTUS SOLOSTAR) 100 UNIT/ML Solostar Pen Inject 24 units into the skin (Patient not taking: Reported on 02/07/2018) 10 pen 5  .  Insulin Pen Needle 31G X 5 MM MISC Use pen needles for insulin injection daily 100 each 3  . insulin regular (NOVOLIN R,HUMULIN R) 100 units/mL injection Inject 0.05-0.1 mLs (5-10 Units total) into the skin 3 (three) times daily before meals. Relion. Please provide insulin syringes needed 10 mL 11  . Insulin Syringe-Needle U-100 (B-D INS SYRINGE 0.5CC/30GX1/2") 30G X 1/2" 0.5 ML MISC USE AS DIRECTED 4 TIMES A DAY 300 each 3  . letrozole (FEMARA) 2.5 MG tablet TAKE 1 TABLET BY MOUTH EVERY DAY 30 tablet 2  . lisinopril-hydrochlorothiazide (PRINZIDE,ZESTORETIC) 10-12.5 MG tablet Take 1 tablet by mouth daily. 90 tablet 3  . lovastatin (MEVACOR) 40 MG tablet Take 1  tablet (40 mg total) by mouth at bedtime. 90 tablet 3  . metFORMIN (GLUCOPHAGE) 500 MG tablet TAKE 2 TABLETS BY MOUTH DAILY WITH SUPPER. 180 tablet 0  . Na Sulfate-K Sulfate-Mg Sulf 17.5-3.13-1.6 GM/177ML SOLN Take 1 kit by mouth as directed. 354 mL 0  . OVER THE COUNTER MEDICATION Take 1 tablet by mouth daily as needed. Stool softner for constipation, also drinks warm prune juice prn     No current facility-administered medications on file prior to visit.    No Known Allergies Family History  Problem Relation Age of Onset  . Diabetes Sister        x 4  . Diabetes Brother   . Diabetes Paternal Grandmother   . Diabetes Paternal Aunt   . Heart disease Brother   . Heart disease Sister   . Emphysema Brother   . Cancer Father 28       brain tumor   . Colon cancer Neg Hx   . Stomach cancer Neg Hx    PE: There were no vitals taken for this visit. There is no height or weight on file to calculate BMI. Wt Readings from Last 3 Encounters:  02/07/18 181 lb (82.1 kg)  02/07/18 181 lb 9.6 oz (82.4 kg)  01/15/18 183 lb 3.2 oz (83.1 kg)   Constitutional: overweight, in NAD Eyes: PERRLA, EOMI, no exophthalmos ENT: moist mucous membranes, no thyromegaly, no cervical lymphadenopathy Cardiovascular: RRR, No MRG Respiratory: CTA  B Gastrointestinal: abdomen soft, NT, ND, BS+ Musculoskeletal: no deformities, strength intact in all 4 Skin: moist, warm, no rashes Neurological: no tremor with outstretched hands, DTR normal in all 4   ASSESSMENT: 1. 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 HL  PLAN:  1.  Patient with long-standing, uncontrolled, type 2 diabetes, on basal-bolus insulin regimen and metformin, with higher sugars at previous visits due to not taking her mealtime insulin as prescribed.  Sugars were high throughout the day she was only using 6 units of insulin before meals. We discussed at last visit about using higher doses of mealtime insulin and taking it with her when she goes out for lunch.  She could not afford analog rapid acting insulin in the past and Dr. Yong Channel started her on a regular insulin.  However, she did not start this and we gave her samples of NovoLog recently.  - At this visit, she tells me she is in need of a more affordable insulin regimen.   She tells me that she applied for financial assistance for getting Lantus and she thinks that she will qualify for this.  She will also apply forgetting Humalog. We again discussed about regular insulin which can be brought by [facility], without prescription, with $24-$28 per bottle.  For now, I gave her a sample vial of Tresiba to use instead of Lantus and also Humalog. - Her sugars remain very high, and she is not following instructions about increasing her mealtime insulin.  I again strongly advised her to try to go up on the doses, since 6 to 7 units is not enough for her.  We have been trying to do this for the last several appointments without success.  I also advised her to move her basal insulin at night.  Of course, if she remains on Tresiba, this would not make a big difference, however, if she starts Lantus, taking it at night would be more beneficial. - I suggested to:  Patient  Instructions  Please continue: - Metformin 1000 mg 2x a day with meals - Lantus/Tresiba 24 units but move this at night  Please increase: - Humalog before a meal: - 8 units with a smaller meal - 10 units with a regular meal - 12 units with a large meal/if you eat out/if you have dessert  Let me know if you cannot obtain the insulins.  Please return on July 16 at 1:15 pm.  - today, HbA1c is 9.3% (slightly higher) - continue checking sugars at different times of the day - check 3x a day, rotating checks - advised for yearly eye exams >> she is UTD - Return to clinic in 1.5 mo with sugar log   2. HL - Reviewed latest lipid panel from 10/2017: LDL higher - Continues lovastatin 40 without side effects.  She was previously on Crestor 5.  Philemon Kingdom, MD PhD [facility] Endocr[facility]

## 2018-03-08 NOTE — Telephone Encounter (Signed)
Call to ms. Ma for fup on result of application for lantus. Left my direct line for call back,  [phone]

## 2018-03-14 ENCOUNTER — Other Ambulatory Visit: Payer: Self-pay

## 2018-03-14 MED ORDER — AMLODIPINE BESYLATE 10 MG PO TABS: ORAL_TABLET | ORAL | 1 refills | Status: DC

## 2018-03-18 ENCOUNTER — Telehealth: Payer: Self-pay | Admitting: Family Medicine

## 2018-03-18 NOTE — Telephone Encounter (Signed)
Copied from [place] [phone]. Topic: Inquiry >> Mar 18, 2018 12:27 PM Lennox Solders wrote: Reason for CRM:pt is calling she has been approve for her lantus from [facility]

## 2018-03-19 NOTE — Telephone Encounter (Signed)
Called and spoke to patient who is excited she got approved. I instructed her to call the number listed on the letter and they will let her know how they will get her insulin to her. She verbalized understanding

## 2018-03-25 ENCOUNTER — Telehealth: Payer: Self-pay | Admitting: Internal Medicine

## 2018-03-25 NOTE — Telephone Encounter (Signed)
[employer] calling on the status of fax sent to [name] for patient  medication lantus, please advise  P#  [phone] or [phone]

## 2018-03-26 ENCOUNTER — Telehealth: Payer: Self-pay

## 2018-03-26 NOTE — Telephone Encounter (Signed)
Call to Ms. Toral to fup regarding her lantus and rx assistance Left direct line for call back with status [phone] [name] RN

## 2018-03-27 DIAGNOSIS — H35372 Puckering of macula, left eye: Secondary | ICD-10-CM | POA: Diagnosis not present

## 2018-03-27 DIAGNOSIS — H40013 Open angle with borderline findings, low risk, bilateral: Secondary | ICD-10-CM | POA: Diagnosis not present

## 2018-03-27 DIAGNOSIS — H26493 Other secondary cataract, bilateral: Secondary | ICD-10-CM | POA: Diagnosis not present

## 2018-03-27 DIAGNOSIS — E113513 Type 2 diabetes mellitus with proliferative diabetic retinopathy with macular edema, bilateral: Secondary | ICD-10-CM | POA: Diagnosis not present

## 2018-03-27 DIAGNOSIS — H26492 Other secondary cataract, left eye: Secondary | ICD-10-CM | POA: Diagnosis not present

## 2018-03-27 LAB — HM DIABETES EYE EXAM

## 2018-03-27 NOTE — Telephone Encounter (Signed)
See note.   Copied from [place] [id number]. Topic: Quick Communication - Office Called Patient >> Mar 27, 2018  1:07 PM Yvette Rack wrote: Reason for CRM: pt calling Manuela Schwartz back the number that she left in message was busy

## 2018-03-27 NOTE — Telephone Encounter (Signed)
Attempted to contact today but I did not get an answer at either number and there was no vm

## 2018-03-27 NOTE — Telephone Encounter (Signed)
Pt calling [name], RN the

## 2018-03-28 ENCOUNTER — Telehealth: Payer: Self-pay

## 2018-03-28 ENCOUNTER — Telehealth: Payer: Self-pay | Admitting: Internal Medicine

## 2018-03-28 NOTE — Telephone Encounter (Signed)
Called [employer] today to request they put 2 identifiers on packing slips so we can ensure we are giving the right medication to the right patient because the packing slip only listed this patients name

## 2018-03-28 NOTE — Telephone Encounter (Signed)
Patient is requesting a call back about paperwork that should be ready to pick up Please advise

## 2018-03-28 NOTE — Telephone Encounter (Signed)
Patients Lantus came in from [facility] and patient has been notified

## 2018-03-28 NOTE — Telephone Encounter (Signed)
Called patient and made her aware that it is Lantus she is picking up not paperwork and it is ready now

## 2018-03-29 NOTE — Telephone Encounter (Signed)
03/29/2018  [name] called and confirmed receipt of her lantus and picking it up at Dr. Cruzita Lederer today.

## 2018-03-29 NOTE — Telephone Encounter (Signed)
Thanks susan!

## 2018-04-03 ENCOUNTER — Other Ambulatory Visit: Payer: Self-pay | Admitting: Hematology

## 2018-04-15 DIAGNOSIS — H26491 Other secondary cataract, right eye: Secondary | ICD-10-CM | POA: Diagnosis not present

## 2018-04-15 LAB — HM DIABETES EYE EXAM

## 2018-04-16 ENCOUNTER — Ambulatory Visit: Payer: Medicare Other | Admitting: Internal Medicine

## 2018-04-22 DIAGNOSIS — H35033 Hypertensive retinopathy, bilateral: Secondary | ICD-10-CM | POA: Diagnosis not present

## 2018-04-22 DIAGNOSIS — E113512 Type 2 diabetes mellitus with proliferative diabetic retinopathy with macular edema, left eye: Secondary | ICD-10-CM | POA: Diagnosis not present

## 2018-04-22 DIAGNOSIS — H35373 Puckering of macula, bilateral: Secondary | ICD-10-CM | POA: Diagnosis not present

## 2018-05-03 ENCOUNTER — Ambulatory Visit: Payer: Medicare Other | Admitting: Physician Assistant

## 2018-05-03 ENCOUNTER — Ambulatory Visit: Payer: Self-pay | Admitting: Family Medicine

## 2018-05-03 NOTE — Telephone Encounter (Signed)
Patient's husband passed away during the night- patient is under a lot of stress and she has started vomiting. Call to office- they are going to see patient today in the office.  Reason for Disposition . Vomiting a prescription medication    Patient has just lost her husband- she is under a lot of stress- vomiting has started today- call to office to get her seen- she is high risk patient  Answer Assessment - Initial Assessment Questions 1. VOMITING SEVERITY: "How many times have you vomited in the past 24 hours?"     - MILD:  1 - 2 times/day    - MODERATE: 3 - 5 times/day, decreased oral intake without significant weight loss or symptoms of dehydration    - SEVERE: 6 or more times/day, vomits everything or nearly everything, with significant weight loss, symptoms of dehydration      3 times 2. ONSET: "When did the vomiting begin?"      8:30 am- patient has vomited about 3 times 3. FLUIDS: "What fluids or food have you vomited up today?" "Have you been able to keep any fluids down?"     Patient has been able to keep a small amount of bread and water down 4. ABDOMINAL PAIN: "Are your having any abdominal pain?" If yes : "How bad is it and what does it feel like?" (e.g., crampy, dull, intermittent, constant)      No pain 5. DIARRHEA: "Is there any diarrhea?" If so, ask: "How many times today?"      No diarrhea 6. CONTACTS: "Is there anyone else in the family with the same symptoms?"      n/a 7. CAUSE: "What do you think is causing your vomiting?"     stress 8. HYDRATION STATUS: "Any signs of dehydration?" (e.g., dry mouth [not only dry lips], too weak to stand) "When did you last urinate?"     Just started 9. OTHER SYMPTOMS: "Do you have any other symptoms?" (e.g., fever, headache, vertigo, vomiting blood or coffee grounds, recent head injury)     Dizzy this morning 10. PREGNANCY: "Is there any chance you are pregnant?" "When was your last menstrual period?"       n/a  Protocols used:  [facility]

## 2018-05-03 NOTE — Telephone Encounter (Signed)
Noted  

## 2018-05-03 NOTE — Telephone Encounter (Signed)
Her daughter in law was transferred/ conferenced to front staff- I think Marcene Brawn to make an appointment. I thought she was going to bring her in- something must have changed in their conversation.

## 2018-05-03 NOTE — Telephone Encounter (Signed)
I scheduled the appointment at 12:40 with [facility] however, after looking in the past appointments patient called back to cancel due to not feeling up to coming out and will be at her appointment on next Friday.

## 2018-05-03 NOTE — Telephone Encounter (Signed)
Opal Sidles, I do not see patient on the schedule for today. Is she scheduled somewhere other than appointment on 8/9 with Dr. Yong Channel.

## 2018-05-06 ENCOUNTER — Telehealth: Payer: Self-pay | Admitting: Family Medicine

## 2018-05-06 MED ORDER — BUSPIRONE HCL 5 MG PO TABS: 5.0000 mg | ORAL_TABLET | Freq: Three times a day (TID) | ORAL | 0 refills | Status: DC | PRN

## 2018-05-06 NOTE — Telephone Encounter (Signed)
Copied from [place] [phone]. Topic: Quick Communication - See Telephone Encounter >> May 06, 2018  8:30 AM Ahmed Prima L wrote: CRM for notification. See Telephone encounter for: 05/06/18.  [name] ( daughter in law ) would like Wendy [name] to call her back in regards to her appointment that she has scheduled for 8/9. She is on the [facility]. She said she does not think she can bring her in regarding her father in laws funeral. She is wanting to know could Dr Yong Channel possibly call her in something to help her nerves and help her sleep. She said that Wendy [name] is very familiar with her and her situation. Please call [facility] @ [phone].

## 2018-05-06 NOTE — Telephone Encounter (Signed)
I sent in buspirone to try to help with the anxiety. Can use up to 3x a day as needed.

## 2018-05-06 NOTE — Addendum Note (Signed)
Addended by: Marin Olp on: 05/06/2018 07:47 PM   Modules accepted: Orders

## 2018-05-07 ENCOUNTER — Telehealth: Payer: Self-pay | Admitting: *Deleted

## 2018-05-07 NOTE — Telephone Encounter (Signed)
"  [name] calling for my mother-n-law.  She will not be in for today's appointments.  Her husband passed away.  Next week or within the time after the funeral, she will call to reschedule."  Expressed condolences for patient and family.

## 2018-05-07 NOTE — Telephone Encounter (Signed)
Called and spoke with [name] who stated she has already picked up medication. She states Aqua has an appointment Friday and she will be here for that.

## 2018-05-08 ENCOUNTER — Inpatient Hospital Stay: Payer: Medicare Other | Admitting: Hematology

## 2018-05-08 ENCOUNTER — Inpatient Hospital Stay: Payer: Medicare Other

## 2018-05-10 ENCOUNTER — Encounter: Payer: Self-pay | Admitting: Family Medicine

## 2018-05-10 ENCOUNTER — Ambulatory Visit (INDEPENDENT_AMBULATORY_CARE_PROVIDER_SITE_OTHER): Payer: Medicare Other | Admitting: Family Medicine

## 2018-05-10 VITALS — BP 118/60 | HR 71 | Temp 98.5°F | Ht 62.0 in | Wt 186.4 lb

## 2018-05-10 DIAGNOSIS — N183 Chronic kidney disease, stage 3 unspecified: Secondary | ICD-10-CM

## 2018-05-10 DIAGNOSIS — F17200 Nicotine dependence, unspecified, uncomplicated: Secondary | ICD-10-CM

## 2018-05-10 DIAGNOSIS — I10 Essential (primary) hypertension: Secondary | ICD-10-CM

## 2018-05-10 DIAGNOSIS — F4321 Adjustment disorder with depressed mood: Secondary | ICD-10-CM | POA: Diagnosis not present

## 2018-05-10 DIAGNOSIS — E785 Hyperlipidemia, unspecified: Secondary | ICD-10-CM | POA: Diagnosis not present

## 2018-05-10 LAB — LDL CHOLESTEROL, DIRECT: LDL DIRECT: 98 mg/dL

## 2018-05-10 LAB — COMPREHENSIVE METABOLIC PANEL
ALK PHOS: 90 U/L (ref 39–117)
ALT: 5 U/L (ref 0–35)
AST: 9 U/L (ref 0–37)
Albumin: 3.9 g/dL (ref 3.5–5.2)
BUN: 18 mg/dL (ref 6–23)
CALCIUM: 9.5 mg/dL (ref 8.4–10.5)
CO2: 30 mEq/L (ref 19–32)
CREATININE: 1.16 mg/dL (ref 0.40–1.20)
Chloride: 100 mEq/L (ref 96–112)
GFR: 58.28 mL/min — ABNORMAL LOW (ref >60.00)
Glucose, Bld: 279 mg/dL — ABNORMAL HIGH (ref 70–99)
Potassium: 4.1 mEq/L (ref 3.5–5.1)
SODIUM: 138 meq/L (ref 135–145)
TOTAL PROTEIN: 7.3 g/dL (ref 6.0–8.3)
Total Bilirubin: 0.4 mg/dL (ref 0.2–1.2)

## 2018-05-10 LAB — CBC
HEMATOCRIT: 33.8 % — AB (ref 36.0–46.0)
Hemoglobin: 11.4 g/dL — ABNORMAL LOW (ref 12.0–15.0)
MCHC: 33.8 g/dL (ref 30.0–36.0)
MCV: 83.3 fl (ref 78.0–100.0)
PLATELETS: 254 10*3/uL (ref 150.0–400.0)
RBC: 4.06 Mil/uL (ref 3.87–5.11)
RDW: 15.7 % — ABNORMAL HIGH (ref 11.5–15.5)
WBC: 6.9 10*3/uL (ref 4.0–10.5)

## 2018-05-10 NOTE — Assessment & Plan Note (Signed)
S: knows to avoid nsaids. BP controlled A/P: update bmet with labs

## 2018-05-10 NOTE — Assessment & Plan Note (Signed)
S: poorly controlled on last check on lovastatin 40mg . Had been on crestor but statin intolerant on multiple products Lab Results  Component Value Date   CHOL 206 (H) 10/10/2017   HDL 65.20 10/10/2017   LDLCALC 119 (H) 10/10/2017   LDLDIRECT 91 10/25/2012   TRIG 113.0 10/10/2017   CHOLHDL 3 10/10/2017   A/P: continue current rx. Update LDL and hoping at least under 130 though unlikely to be able to titrate up due to myalgias

## 2018-05-10 NOTE — Assessment & Plan Note (Signed)
Not smoking or using snuff. Congratulated her on this. May still be using nicotine gum- will check with her next visit

## 2018-05-10 NOTE — Progress Notes (Signed)
Subjective:  Wendy [name] is a 77 y.o. year old very pleasant female patient who presents for/with See problem oriented charting ROS- dealing with sadness after loss of husband. No increased edema from trace. No chest pain or shortness of breath reported   Past Medical History-  Patient Active Problem List   Diagnosis Date Noted  . Malignant neoplasm of upper-outer quadrant of left breast in female, estrogen receptor positive ([place]) 11/20/2016    Priority: High  . 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 Chronic diastolic heart failure ([place]) 07/06/2011    Priority: High  . Rectal bleeding 01/17/2017    Priority: Medium  . CKD (chronic kidney disease) stage 3, GFR 30-59 ml/min (HCC) 03/17/2016    Priority: Medium  . Osteopenia 01/05/2016    Priority: Medium  . TOBACCO USE 04/13/2008    Priority: Medium  . Hyperlipidemia 11/29/2006    Priority: Medium  . OBESITY, BMI 30-35 11/29/2006    Priority: Medium  . HYPERTENSION, BENIGN SYSTEMIC 11/29/2006    Priority: Medium  . Fatigue 03/14/2017    Priority: Low  . Edema 03/14/2017    Priority: Low  . Port catheter in place 01/18/2017    Priority: Low  . Constipation 01/17/2017    Priority: Low  . Seasonal allergies 12/01/2013    Priority: Low  . Grief 05/10/2018  . Anemia 01/09/2018    Medications- reviewed and updated Current Outpatient Medications  Medication Sig Dispense Refill  . albuterol (PROVENTIL HFA;VENTOLIN HFA) 108 (90 Base) MCG/ACT inhaler Inhale 2 puffs into the lungs every 6 (six) hours as needed for wheezing or shortness of breath. 1 Inhaler 2  . amLODipine (NORVASC) 10 MG tablet TAKE 1 TABLET (10 MG TOTAL) BY MOUTH DAILY. 90 tablet 1  . aspirin 81 MG tablet Take 81 mg by mouth daily.      . B-D INS SYRINGE 0.5CC/31GX5/16 31G X 5/16" 0.5 ML MISC USE AS DIRECTED 4 TIMES A DAY 300 each  1  . busPIRone (BUSPAR) 5 MG tablet Take 1 tablet (5 mg total) by mouth 3 (three) times daily as needed. 40 tablet 0  . carvedilol (COREG) 25 MG tablet Take 1 tablet (25 mg total) by mouth 2 (two) times daily with a meal. -- Office visit needed for further refills 60 tablet 0  . cloNIDine (CATAPRES) 0.3 MG tablet Take 1 tablet (0.3 mg total) by mouth 2 (two) times daily. 180 tablet 2  . Insulin Glargine (LANTUS SOLOSTAR) 100 UNIT/ML Solostar Pen Inject 24 units into the skin 10 pen 5  . Insulin Pen Needle 31G X 5 MM MISC Use pen needles for insulin injection daily 100 each 3  . insulin regular (NOVOLIN R,HUMULIN R) 100 units/mL injection Inject 0.05-0.1 mLs (5-10 Units total) into the skin 3 (three) times daily before meals. Relion. Please provide insulin syringes needed 10 mL 11  . Insulin Syringe-Needle U-100 (B-D INS SYRINGE 0.5CC/30GX1/2") 30G X 1/2" 0.5 ML MISC USE AS DIRECTED 4 TIMES A DAY 300 each 3  . letrozole (FEMARA) 2.5 MG tablet TAKE 1 TABLET BY MOUTH EVERY DAY 90 tablet 0  . lisinopril-hydrochlorothiazide (PRINZIDE,ZESTORETIC) 10-12.5 MG tablet Take 1 tablet by mouth daily. 90 tablet 3  . lovastatin (MEVACOR) 40 MG tablet Take 1 tablet (40 mg total) by mouth at bedtime. 90 tablet 3  . metFORMIN (GLUCOPHAGE) 500 MG tablet TAKE 2 TABLETS BY  MOUTH DAILY WITH SUPPER. 180 tablet 0  . Na Sulfate-K Sulfate-Mg Sulf 17.5-3.13-1.6 GM/177ML SOLN Take 1 kit by mouth as directed. 354 mL 0  . OVER THE COUNTER MEDICATION Take 1 tablet by mouth daily as needed. Stool softner for constipation, also drinks warm prune juice prn     No current facility-administered medications for this visit.     Objective: BP 118/60 (BP Location: Left Arm, Patient Position: Sitting, Cuff Size: Normal)   Pulse 71   Temp 98.5 F (36.9 C) (Oral)   Ht 5' 2"  (1.575 m)   Wt 186 lb 6.4 oz (84.6 kg)   SpO2 97%   BMI 34.09 kg/m  Gen: NAD, resting comfortably CV: RRR no murmurs rubs or gallops Lungs: CTAB no crackles,  wheeze, rhonchi Abdomen: soft/nontender/nondistended/normal bowel sounds.  Ext: trace edema L>R Skin: warm, dry Some sad mood given loss of husband  Assessment/Plan:  Has follow up next month with Wendy [name]  Hypertension S: controlled on  lisinopril hctz 10-12.15m, amlodipine 168m coreg 2562mID, clonidone 0.3 mg BID BP Readings from Last 3 Encounters:  05/10/18 118/60  02/27/18 106/64  02/07/18 136/72  A/P: We discussed blood pressure goal of <140/90. Continue current meds  Hyperlipidemia S: poorly controlled on last check on lovastatin 3m15mad been on crestor but statin intolerant on multiple products Lab Results  Component Value Date   CHOL 206 (H) 10/10/2017   HDL 65.20 10/10/2017   LDLCALC 119 (H) 10/10/2017   LDLDIRECT 91 10/25/2012   TRIG 113.0 10/10/2017   CHOLHDL 3 10/10/2017   A/P: continue current rx. Update LDL and hoping at least under 130 though unlikely to be able to titrate up due to myalgias  CKD (chronic kidney disease) stage 3, GFR 30-59 ml/min (HCC) S: knows to avoid nsaids. BP controlled A/P: update bmet with labs  TOBACCO USE Not smoking or using snuff. Congratulated her on this. May still be using nicotine gum- will check with her next visit  Grief S: grief- lost husband Wendy [name] last week. Using buspirone is helping associated anxiety A/P: continue current prn use  Future Appointments  Date Time Provider [place]/2019 11:15 AM Wendy [name] [facility] None  08/12/2018 10:45 AM Wendy [name] [facility]   Lab/Order associations: Hyperlipidemia, unspecified hyperlipidemia type - Plan: CBC, Comprehensive metabolic panel, LDL cholesterol, direct  HYPERTENSION, BENIGN SYSTEMIC - Plan: CBC, Comprehensive metabolic panel, LDL cholesterol, direct  CKD (chronic kidney disease) stage 3, GFR 30-59 ml/min (HCC)  TOBACCO USE  Grief  Return precautions advised.  Wendy [name]

## 2018-05-10 NOTE — Patient Instructions (Addendum)
Health Maintenance Due  Topic Date Due  . OPHTHALMOLOGY EXAM - Patient had it done in June. Our office will request these results.  01/14/2016  . INFLUENZA VACCINE - Please schedule around Fall time 05/02/2018   . Please check with your pharmacy to see if they have the shingrix vaccine. If they do- please get this immunization and update [name] by phone call or mychart with dates you receive the vaccine.    No changes today. Reschedule with Dr. Burr Medico. Keep appointment with Dr. Cruzita Lederer later this month.   So sorry for your loss. We will miss Jeneen Rinks certainly.

## 2018-05-10 NOTE — Assessment & Plan Note (Signed)
S: grief- lost husband Wendy [name] within last week. Using buspirone is helping associated anxiety A/P: continue current prn use

## 2018-05-16 ENCOUNTER — Encounter: Payer: Self-pay | Admitting: Family Medicine

## 2018-05-20 ENCOUNTER — Telehealth: Payer: Self-pay | Admitting: Family Medicine

## 2018-05-20 DIAGNOSIS — Z853 Personal history of malignant neoplasm of breast: Secondary | ICD-10-CM | POA: Diagnosis not present

## 2018-05-20 NOTE — Telephone Encounter (Signed)
Please increase the dose to 7.5 mg TID prn #60 with 1 refill. IF not doing better in 2 weeks please see [name]. Please also encourage hospice grief counseling

## 2018-05-20 NOTE — Telephone Encounter (Signed)
Please advise 

## 2018-05-20 NOTE — Telephone Encounter (Signed)
Copied from [place] [id number]. Topic: Quick Communication - See Telephone Encounter >> May 20, 2018 12:21 PM Synthia Innocent wrote: CRM for notification. See Telephone encounter for: 05/20/18. Having a terrible time, husband is dying. Unable to sleep, feels like busPIRone (BUSPAR) 5 MG tablet is not working for her. Requesting to trying to another medicine that is stronger. Please advise

## 2018-05-21 MED ORDER — BUSPIRONE HCL 7.5 MG PO TABS: 7.5000 mg | ORAL_TABLET | Freq: Three times a day (TID) | ORAL | 1 refills | Status: DC

## 2018-05-21 NOTE — Telephone Encounter (Signed)
Left message on voicemail to call office. Rx sent to pharmacy.  

## 2018-05-22 NOTE — Telephone Encounter (Signed)
Left message on voicemail to call office on mobile #. Rx was sent to pharmacy.

## 2018-05-28 ENCOUNTER — Ambulatory Visit: Payer: Medicare Other | Admitting: Internal Medicine

## 2018-05-29 DIAGNOSIS — E113512 Type 2 diabetes mellitus with proliferative diabetic retinopathy with macular edema, left eye: Secondary | ICD-10-CM | POA: Diagnosis not present

## 2018-05-30 ENCOUNTER — Encounter: Payer: Self-pay | Admitting: Internal Medicine

## 2018-05-30 ENCOUNTER — Ambulatory Visit (INDEPENDENT_AMBULATORY_CARE_PROVIDER_SITE_OTHER): Payer: Medicare Other | Admitting: Internal Medicine

## 2018-05-30 VITALS — BP 138/74 | HR 71 | Ht 62.0 in | Wt 186.4 lb

## 2018-05-30 DIAGNOSIS — IMO0002 Reserved for concepts with insufficient information to code with codable children: Secondary | ICD-10-CM

## 2018-05-30 DIAGNOSIS — E113419 Type 2 diabetes mellitus with severe nonproliferative diabetic retinopathy with macular edema, unspecified eye: Secondary | ICD-10-CM

## 2018-05-30 DIAGNOSIS — Z794 Long term (current) use of insulin: Secondary | ICD-10-CM | POA: Diagnosis not present

## 2018-05-30 DIAGNOSIS — E1165 Type 2 diabetes mellitus with hyperglycemia: Secondary | ICD-10-CM | POA: Diagnosis not present

## 2018-05-30 DIAGNOSIS — E785 Hyperlipidemia, unspecified: Secondary | ICD-10-CM

## 2018-05-30 LAB — POCT GLYCOSYLATED HEMOGLOBIN (HGB A1C): HEMOGLOBIN A1C: 9.6 % — AB (ref 4.0–5.6)

## 2018-05-30 MED ORDER — INSULIN REGULAR HUMAN 100 UNIT/ML IJ SOLN: 10.0000 [IU] | Freq: Three times a day (TID) | INTRAMUSCULAR | 11 refills | Status: DC

## 2018-05-30 NOTE — Patient Instructions (Addendum)
Please continue: - Metformin 1000 mg 2x a day with meals - Lantus 24 units at night  Please increase: - Novolog before a meal: -10 units with a smaller meal -12 units with a regular meal -14-16 units with a large meal/if you eat out/if you have dessert  Please come back in 3 months with your sugar log

## 2018-05-30 NOTE — Addendum Note (Signed)
Addended by: Drucilla Schmidt on: 05/30/2018 12:59 PM   Modules accepted: Orders

## 2018-05-30 NOTE — Progress Notes (Addendum)
Patient ID: Wendy [name], female   DOB: 11/21/1940, 77 y.o.   MRN: [id number]  HPI: Wendy [name] is a 77 y.o.-year-old female, returning for f/u for DM2, dx in 42s, insulin-dependent since 1990, uncontrolled, with complications (CKD, DR). Last visit 3 months ago.  She is here with her daughter who offers part of the history especially related to her insulin doses and her blood sugars.  Her husband died 05-07-2018.  They have been married for 55 years.  She started Buspar to help with grief/insomnia/anxiety.  Last hemoglobin A1c was: Lab Results  Component Value Date   HGBA1C 9.3 (A) 02/27/2018   HGBA1C 9.2 11/29/2017   HGBA1C 8.4 07/26/2017    Pt is on: - Metformin 1000 mg 2x a day with meals - Lantus 24 units at night - NovoLog before a meal, however, she is now out: -8 units with a smaller meal -10 units with a regular meal (-12 units with a large meal/if you eat out/if you have) Lantus costs her 149$ per box >> she applied for financial assistance to get this free. She got this >> received the med.  Pt checks her sugars 4-6 times a day- per review of her meter values.  We could not download her meter so I reviewed the manually. - am:  120-187 (snack) >> 177-265 >> 131-171 - 2h after b'fast: n/c >> 116-270 >> n/c >> 220-344 - before lunch:70 -160 >> 113, 148 >> n/c - 2h after lunch: n/c >> 269 >> n/c >> 255-267 - before dinner:  200 >> 81, 170-275 >> n/c - 2h after dinner:  120-174 >> 135 >> n/c - bedtime:179-260 >> 304-441 (sweets) >> 180, 244-387 - nighttime:  n/c >> 73 x1, 130 >> n/c Lowest sugar was 32 >> ... >> 70 >> 81 >> 120 she has hypoglycemia awareness in the 70s. Highest sugar was 300s x 2 >> 441 >> 400s  Glucometer: [employer]  Pt's meals are: - Breakfast: toast, egg, bacon/sausage; oatmeal - Lunch: [place] sandwich, soups, crackers - Dinner: baked chicken, potatoes, cabbage (dinner is her largest meal) - Snacks: PB and crackers  -+ Mild CKD, last  BUN/creatinine:  Lab Results  Component Value Date   BUN 18 05/10/2018   CREATININE 1.16 05/10/2018  On  lisinopril 10. -+ HL; last set of lipids: Lab Results  Component Value Date   CHOL 206 (H) 10/10/2017   HDL 65.20 10/10/2017   LDLCALC 119 (H) 10/10/2017   LDLDIRECT 98.0 05/10/2018   TRIG 113.0 10/10/2017   CHOLHDL 3 10/10/2017  On lovastatin 40. 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  She has history of laser surgery.She usually sees Dr. Kathlen Mody.   -+ Neuropathic pain in feet On ASA 81.  She has breast cancer and had left breast lumpectomy + chemotherapy for 4 weeks + radiotherapy.    ROS: Constitutional: no weight gain/no weight loss, no fatigue, no subjective hyperthermia, no subjective hypothermia Eyes: no blurry vision, no xerophthalmia ENT: no sore throat, no nodules palpated in throat, no dysphagia, no odynophagia, no hoarseness Cardiovascular: no CP/no SOB/no palpitations/no leg swelling Respiratory: no cough/no SOB/no wheezing Gastrointestinal: no N/no V/no D/no C/no acid reflux Musculoskeletal: no muscle aches/no joint aches Skin: no rashes, no hair loss Neurological: no tremors/no numbness/no tingling/no dizziness  I reviewed pt's medications, allergies, PMH, social hx, family hx, and changes were documented in the history of present illness. Otherwise, unchanged from my initial visit note.  Past Medical  History:  Diagnosis Date  . Abnormal breast finding 2018   per pt/ having  a lot drainage from left breast nipple  . Breast cancer ([place]) 11/2016   left/  . GERD (gastroesophageal reflux disease)    TUMS as needed  . HTN (hypertension)    states BP has been high recently; has been on med. x 20 yr.  . Hyperlipidemia   . Insulin dependent diabetes mellitus ([place])   . Personal history of chemotherapy    2018/finished 6 weeks of chemo in Sep 2018  . Personal history of radiation therapy    2018 left breast/finished radiation  in Sept 2018 per pt.   Past Surgical History:  Procedure Laterality Date  . BREAST LUMPECTOMY Left 05/05/2008  . BREAST LUMPECTOMY Left 12/07/2016   malignant  . BREAST LUMPECTOMY WITH RADIOACTIVE SEED AND SENTINEL LYMPH NODE BIOPSY Left 12/07/2016   Procedure: LEFT BREAST LUMPECTOMY WITH RADIOACTIVE SEED AND SENTINEL LYMPH NODE BIOPSY;  Surgeon: Erroll Luna, MD;  Location: [facility];  Service: General;  Laterality: Left;  . IR FLUORO GUIDE PORT INSERTION RIGHT  01/04/2017  . IR US GUIDE VASC ACCESS RIGHT  01/04/2017  . PORT-A-CATH REMOVAL N/A 05/30/2017   Procedure: REMOVAL PORT-A-CATH;  Surgeon: Erroll Luna, MD;  Location: [facility];  Service: General;  Laterality: N/A;  . RE-EXCISION OF BREAST LUMPECTOMY Left 12/28/2016   Procedure: RE-EXCISION OF BREAST LUMPECTOMY;  Surgeon: Erroll Luna, MD;  Location: [facility];  Service: General;  Laterality: Left;   Social History   Social History  . Marital Status: Married    Spouse Name: N/A  . Number of Children: 4   . retired    Social History Main Topics  . Smoking status: Never Smoker   . Smokeless tobacco: Former Systems developer    Types: Snuff    Quit date: 10/02/2014     Comment: Quit 10/02/2014  previous 1 box per week of snuff   . Alcohol Use: No  . Drug Use: No   Current Outpatient Medications on File Prior to Visit  Medication Sig Dispense Refill  . albuterol (PROVENTIL HFA;VENTOLIN HFA) 108 (90 Base) MCG/ACT inhaler Inhale 2 puffs into the lungs every 6 (six) hours as needed for wheezing or shortness of breath. 1 Inhaler 2  . amLODipine (NORVASC) 10 MG tablet TAKE 1 TABLET (10 MG TOTAL) BY MOUTH DAILY. 90 tablet 1  . aspirin 81 MG tablet Take 81 mg by mouth daily.      . B-D INS SYRINGE 0.5CC/31GX5/16 31G X 5/16" 0.5 ML MISC USE AS DIRECTED 4 TIMES A DAY 300 each 1  . busPIRone (BUSPAR) 7.5 MG tablet Take 1 tablet (7.5 mg total) by mouth 3 (three) times daily. 90 tablet 1  . carvedilol (COREG) 25 MG  tablet Take 1 tablet (25 mg total) by mouth 2 (two) times daily with a meal. -- Office visit needed for further refills 60 tablet 0  . cloNIDine (CATAPRES) 0.3 MG tablet Take 1 tablet (0.3 mg total) by mouth 2 (two) times daily. 180 tablet 2  . Insulin Glargine (LANTUS SOLOSTAR) 100 UNIT/ML Solostar Pen Inject 24 units into the skin 10 pen 5  . Insulin Pen Needle 31G X 5 MM MISC Use pen needles for insulin injection daily 100 each 3  . insulin regular (NOVOLIN R,HUMULIN R) 100 units/mL injection Inject 0.05-0.1 mLs (5-10 Units total) into the skin 3 (three) times daily before meals. Relion. Please provide insulin syringes needed 10 mL 11  .  Insulin Syringe-Needle U-100 (B-D INS SYRINGE 0.5CC/30GX1/2") 30G X 1/2" 0.5 ML MISC USE AS DIRECTED 4 TIMES A DAY 300 each 3  . letrozole (FEMARA) 2.5 MG tablet TAKE 1 TABLET BY MOUTH EVERY DAY 90 tablet 0  . lisinopril-hydrochlorothiazide (PRINZIDE,ZESTORETIC) 10-12.5 MG tablet Take 1 tablet by mouth daily. 90 tablet 3  . lovastatin (MEVACOR) 40 MG tablet Take 1 tablet (40 mg total) by mouth at bedtime. 90 tablet 3  . metFORMIN (GLUCOPHAGE) 500 MG tablet TAKE 2 TABLETS BY MOUTH DAILY WITH SUPPER. 180 tablet 0  . Na Sulfate-K Sulfate-Mg Sulf 17.5-3.13-1.6 GM/177ML SOLN Take 1 kit by mouth as directed. 354 mL 0  . OVER THE COUNTER MEDICATION Take 1 tablet by mouth daily as needed. Stool softner for constipation, also drinks warm prune juice prn     No current facility-administered medications on file prior to visit.    No Known Allergies Family History  Problem Relation Age of Onset  . Diabetes Sister        x 4  . Diabetes Brother   . Diabetes Paternal Grandmother   . Diabetes Paternal Aunt   . Heart disease Brother   . Heart disease Sister   . Emphysema Brother   . Cancer Father 29       brain tumor   . Colon cancer Neg Hx   . Stomach cancer Neg Hx    PE: BP 138/74   Pulse 71   Ht 5' 2"  (1.575 m)   Wt 186 lb 6.4 oz (84.6 kg)   SpO2 97%   BMI  34.09 kg/m  Body mass index is 34.09 kg/m. Wt Readings from Last 3 Encounters:  05/30/18 186 lb 6.4 oz (84.6 kg)  05/10/18 186 lb 6.4 oz (84.6 kg)  02/27/18 181 lb (82.1 kg)   Constitutional: overweight, in NAD Eyes: PERRLA, EOMI, no exophthalmos ENT: moist mucous membranes, no thyromegaly, no cervical lymphadenopathy Cardiovascular: RRR, No MRG Respiratory: CTA B Gastrointestinal: abdomen soft, NT, ND, BS+ Musculoskeletal: no deformities, strength intact in all 4 Skin: moist, warm, no rashes Neurological: no tremor with outstretched hands, DTR normal in all 4   ASSESSMENT: 1. 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 HL  PLAN:  1.  Patient with long-standing, uncontrolled, type 2 diabetes, on basal-bolus insulin regimen and metformin, with noncompliance with the recommended insulin regimen due to finances.  At last visit, we discussed about using regular insulin which can be bought from [facility], without prescription, pain $24-$28 a bottle, but we gave her samples then pending her response from the pharmaceutical company regarding her application for financial assistance. -I repeatedly advised her in the past to use higher doses of short/rapid acting insulin, but she continued to use 6 to 7 units, which is not enough for her. -At this visit, she tells me that she is indeed using 8 to 10 units of rapid acting insulin, however, she ran out of it.  I again discussed with her and her daughter that she can obtain regular insulin from [facility] and gave her specific instructions about how to do this.  Daughter voices understanding of this.  I also advised them that if she switches from analog insulin to regular insulin she will need to inject this 30 minutes before a meal. -I also advised her to use slightly higher doses of regular insulin to improve her sugars later in the day.  Sugars in the morning are low most of the day.  Therefore, we will continue with the current dose of Lantus, which was approved for financial assistance for her. - I suggested to:  Patient Instructions  Please continue: - Metformin 1000 mg 2x a day with meals - Lantus 24 units at night  Please increase: - Novolog before a meal: -10 units with a smaller meal -12 units with a regular meal -14-16 units with a large meal/if you eat out/if you have dessert  Please come back in 3 months with your sugar log  - today, HbA1c is 9.6% (higher) - continue checking sugars at different times of the day - check 3x a day, rotating checks - advised for yearly eye exams >> she is UTD - Return to clinic in 3 mo with sugar log   2. HL - Reviewed latest lipid panel from 10/2017: LDL higher Lab Results  Component Value Date   CHOL 206 (H) 10/10/2017   HDL 65.20 10/10/2017   LDLCALC 119 (H) 10/10/2017   LDLDIRECT 98.0 05/10/2018   TRIG 113.0 10/10/2017   CHOLHDL 3 10/10/2017  - Continues lovastatin 40 without side effects.  Previously on Crestor 5.  Philemon Kingdom, MD PhD [facility] Endocrinology

## 2018-06-07 DIAGNOSIS — Z23 Encounter for immunization: Secondary | ICD-10-CM | POA: Diagnosis not present

## 2018-06-12 ENCOUNTER — Other Ambulatory Visit: Payer: Self-pay | Admitting: Family Medicine

## 2018-06-12 DIAGNOSIS — E113511 Type 2 diabetes mellitus with proliferative diabetic retinopathy with macular edema, right eye: Secondary | ICD-10-CM | POA: Diagnosis not present

## 2018-06-13 ENCOUNTER — Other Ambulatory Visit: Payer: Self-pay | Admitting: Internal Medicine

## 2018-06-21 ENCOUNTER — Encounter (INDEPENDENT_AMBULATORY_CARE_PROVIDER_SITE_OTHER): Payer: Medicare Other | Admitting: Ophthalmology

## 2018-06-27 ENCOUNTER — Other Ambulatory Visit: Payer: Self-pay | Admitting: Family Medicine

## 2018-06-27 ENCOUNTER — Other Ambulatory Visit: Payer: Self-pay | Admitting: Internal Medicine

## 2018-06-27 NOTE — Telephone Encounter (Signed)
Pt no longer see Dr. Quay Burow.. Will forward to Dr. Yong Channel @ [place]... pls advise on refill.Marland KitchenJohny Chess

## 2018-06-27 NOTE — Telephone Encounter (Signed)
It wont let me alter rx- can you send this in as carvedilol 25mg  BID #180 with 3 refills please?

## 2018-06-28 ENCOUNTER — Telehealth: Payer: Self-pay | Admitting: Family Medicine

## 2018-06-28 MED ORDER — CARVEDILOL 25 MG PO TABS: 25.0000 mg | ORAL_TABLET | Freq: Two times a day (BID) | ORAL | 2 refills | Status: DC

## 2018-06-28 NOTE — Telephone Encounter (Signed)
Copied from [place] [id number]. Topic: Quick Communication - Rx Refill/Question >> Jun 28, 2018  8:24 AM Yvette Rack wrote: Medication: carvedilol (COREG) 25 MG tablet  Has the patient contacted their pharmacy? Yes.   (Agent: If no, request that the patient contact the pharmacy for the refill.) (Agent: If yes, when and what did the pharmacy advise?) they told her to call office so that it'll be at the pharmacy this evening  Preferred Pharmacy (with phone number or street name):   [id number] [address], [place] [phone] (Phone) [phone] (Fax)    Agent: Please be advised that RX refills may take up to 3 business days. We ask that you follow-up with your pharmacy.

## 2018-06-29 ENCOUNTER — Other Ambulatory Visit: Payer: Self-pay | Admitting: Hematology

## 2018-07-03 ENCOUNTER — Telehealth: Payer: Self-pay | Admitting: Family Medicine

## 2018-07-03 ENCOUNTER — Ambulatory Visit (INDEPENDENT_AMBULATORY_CARE_PROVIDER_SITE_OTHER): Payer: Medicare Other | Admitting: Family Medicine

## 2018-07-03 ENCOUNTER — Encounter: Payer: Self-pay | Admitting: Family Medicine

## 2018-07-03 VITALS — BP 116/78 | HR 71 | Temp 98.0°F | Ht 62.0 in | Wt 182.4 lb

## 2018-07-03 DIAGNOSIS — M545 Low back pain, unspecified: Secondary | ICD-10-CM

## 2018-07-03 DIAGNOSIS — R350 Frequency of micturition: Secondary | ICD-10-CM | POA: Diagnosis not present

## 2018-07-03 LAB — POCT URINALYSIS DIPSTICK
Glucose, UA: NEGATIVE
KETONES UA: NEGATIVE
NITRITE UA: NEGATIVE
Protein, UA: POSITIVE — AB
RBC UA: NEGATIVE
SPEC GRAV UA: 1.025 (ref 1.010–1.025)
UROBILINOGEN UA: 1 U/dL
pH, UA: 6 (ref 5.0–8.0)

## 2018-07-03 NOTE — Telephone Encounter (Signed)
Called and spoke with patient, appt scheduled with Dr Rogers Blocker this morning.

## 2018-07-03 NOTE — Telephone Encounter (Signed)
Copied from [place] [id number]. Topic: General - Other >> Jul 03, 2018  8:27 AM Judyann Munson wrote: Reason for CRM: patient is calling to  state she is very uncomfortable for the past 3 days her lower back left side has been hurting, she has also been using the bathroom a lot. Patient is requesting to be seen today with Dr. Yong Channel. Best contact number is [phone]

## 2018-07-03 NOTE — Patient Instructions (Signed)
senakot for constipation can try   If back pain not better, need to see Dr. Yong Channel.   Try heating pad, tylenol, and see if bowel movement helps.   Urine looks good. I think the increased urine frequency is due to diabetes. Let's follow up on culture. If fever/chills, worsening pain go to ER.

## 2018-07-03 NOTE — Progress Notes (Signed)
Patient: Wendy [name] MRN: [id number] DOB: [birth year] PCP: Marin Olp, MD     Subjective:  Chief Complaint  Patient presents with  . poss UTI    HPI: The patient is a 77 y.o. female who presents today for possible UTI. She states on Monday she started to have increased frequency of urination. She has no dysuria, but does have urgency. She does not feel like she is emptying completley. She has no color change to the urine, no smell and no visible blood. No fever/chills. She has some left sided pain on her hip and left buttock/lower back area. She is constipated as well. Last time her bowels moved were Sunday. She has been taking miralax and prune juice. This constipation is normal for her. No blood in her stool.   Her lower back started to hurt her on Monday. Denies any heavy lifting. Its on the left lateral lower side and around her SI joints bilaterally. She wonders if it's because she constipated. Denies any heavy lifting or trauma. No radicular symptoms including numbness, weakness, tingling/radiation in her legs/buttock area. She hasn't tried any medication or heating pads. No CVA tenderness.   Her a1c is 9.6 on 05/2018.   Review of Systems  Constitutional: Negative for chills and fever.  Respiratory: Negative for shortness of breath.   Cardiovascular: Negative for chest pain.  Gastrointestinal: Positive for constipation. Negative for abdominal pain, blood in stool, nausea and vomiting.  Genitourinary: Positive for frequency and urgency. Negative for decreased urine volume, dysuria, flank pain, hematuria, pelvic pain and vaginal discharge.  Musculoskeletal: Positive for back pain.       Left lower back pain  Neurological: Negative for weakness and numbness.    Allergies Patient has No Known Allergies.  Past Medical History Patient  has a past medical history of Abnormal breast finding (2018), Breast cancer ([place]) (11/2016), GERD (gastroesophageal reflux disease), HTN  (hypertension), Hyperlipidemia, Insulin dependent diabetes mellitus ([place]), Personal history of chemotherapy, and Personal history of radiation therapy.  Surgical History Patient  has a past surgical history that includes Breast lumpectomy with radioactive seed and sentinel lymph node biopsy (Left, 12/07/2016); Re-excision of breast lumpectomy (Left, 12/28/2016); IR FLUORO GUIDE PORT INSERTION RIGHT (01/04/2017); IR US Guide Vasc Access Right (01/04/2017); Port-a-cath removal (N/A, 05/30/2017); Breast lumpectomy (Left, 05/05/2008); and Breast lumpectomy (Left, 12/07/2016).  Family History Pateint's family history includes Cancer (age of onset: 29) in her father; Diabetes in her brother, paternal aunt, paternal grandmother, and sister; Emphysema in her brother; Heart disease in her brother and sister.  Social History Patient  reports that she has quit smoking. Her smoking use included cigarettes. She quit smokeless tobacco use about 7 months ago.  Her smokeless tobacco use included snuff. She reports that she does not drink alcohol or use drugs.    Objective: Vitals:   07/03/18 1121  BP: 116/78  Pulse: 71  Temp: 98 F (36.7 C)  TempSrc: Oral  SpO2: 97%  Weight: 182 lb 6.4 oz (82.7 kg)  Height: 5\' 2"  (1.575 m)    Body mass index is 33.36 kg/m.  Physical Exam  Constitutional: She appears well-developed and well-nourished.  Neck: Normal range of motion. Neck supple.  Cardiovascular: Normal rate, regular rhythm and normal heart sounds.  Pulmonary/Chest: Effort normal and breath sounds normal.  Abdominal: Soft. Bowel sounds are normal. She exhibits no distension. There is no tenderness.  Musculoskeletal: Normal range of motion. She exhibits no edema.  Straight leg test negative bilaterally.  Sensation intact  Strength 5/5 bilateral lower legs TTP over her SI joints No CVA tenderness  Vitals reviewed.  UA: with protein, trace leukocytes. No nitrites    Assessment/plan: 1. Urinary  frequency I think this is due to her uncontrolled diabetes. Ua not impressive and likely increased protein from diabetes. Will wati for culture to come back to treat. She is okay with this plan. Really encouraged her to get her diabetes under control. Fever/chills, cva pain she understands to go to ER.  - POCT Urinalysis Dipstick - Urine Culture  2. Acute left-sided low back pain, unspecified whether sciatica present Pain in SI joints. Recommended xray today with hx of breast cancer and her age, but she declined. She is going to see if she have a BM, try a heating pad and some tylenol and see if this gets better. I really emphasized to return if pain not resolved after a few days or getting better.      Return if symptoms worsen or fail to improve.    Orma Flaming, MD [place]   07/03/2018

## 2018-07-04 ENCOUNTER — Encounter (INDEPENDENT_AMBULATORY_CARE_PROVIDER_SITE_OTHER): Payer: Medicare Other | Admitting: Ophthalmology

## 2018-07-04 LAB — URINE CULTURE
MICRO NUMBER: 91183891
SPECIMEN QUALITY: ADEQUATE

## 2018-07-08 DIAGNOSIS — Z853 Personal history of malignant neoplasm of breast: Secondary | ICD-10-CM | POA: Diagnosis not present

## 2018-07-08 DIAGNOSIS — N611 Abscess of the breast and nipple: Secondary | ICD-10-CM | POA: Diagnosis not present

## 2018-07-10 NOTE — Progress Notes (Addendum)
[place]  [phone] [phone]  Clinic Follow Up Note   Patient Care Team: Marin Olp, MD as PCP - General (Family Medicine) Philemon Kingdom, MD as Consulting Physician (Internal Medicine) Jalene Mullet, MD as Consulting Physician (Ophthalmology) Erroll Luna, MD as Consulting Physician (General Surgery) Kyung Rudd, MD as Consulting Physician (Radiation Oncology) [name], MD as Consulting Physician (Hematology) Gardenia Phlegm, NP as Nurse Practitioner (Hematology and Oncology)   Date of Service:  07/11/2018  CHIEF COMPLAINTS:  Follow Up for Invasive Ductal Carcinoma of the left breast  Oncology History   Cancer Staging Malignant neoplasm of upper-outer quadrant of left breast in female, estrogen receptor positive ([place]) Staging form: Breast, AJCC 8th Edition - Clinical: Stage IIB (cT2, cN0, cM0, G3, ER: Positive, PR: Negative, HER2: Negative) - Signed by [name], MD on 11/23/2016 - Pathologic stage from 12/28/2016: Stage IIB (pT2, pN1a(sn), cM0, G3, ER: Positive, PR: Negative, HER2: Negative) - Signed by [name], MD on 01/02/2017       Malignant neoplasm of upper-outer quadrant of left breast in female, estrogen receptor positive ([place])   11/01/2016 Mammogram    Diagnostic mammogram and ultrasound of left breast and axilla showed a 3.1 x 2.1 x 1.4 cm (3.3 x 2.0 x 2.7 cm by ultrasound) mass in the upper outer quadrant of the anterior third of the left breast, associated with pleomorphic calcification. There is a 8 mm (1.8cm by [name]) prominent lymph node in the left axilla.    11/07/2016 Initial Biopsy    Left breast 1:00 position biopsy showed invasive ductal carcinoma and DCIS, G3, left axillary node biopsy was negative.    11/07/2016 Receptors her2    ER 80% positive, PR negative, HER-2 negative, Ki-67 90%    11/20/2016 Initial Diagnosis    Malignant neoplasm of upper-outer quadrant of left breast in female, estrogen  receptor positive ([place])    12/07/2016 Surgery    Left lumpectomy and left axillary sentinel lymph node sampling by Dr. Brantley Stage    12/07/2016 Pathology Results    pT2, pN1 Left Lumpectomy: Grade 3 IDC measuring 3.4 cm, carcinoma broadly present at the superior margin. Grade 3 DCIS. 1 out of 2 left axillary SLN positive for metastatic carcinoma    12/07/2016 Miscellaneous    Mammaprint showed high risk disease, basal type     12/28/2016 Surgery    Re-excision of the previously positive superior margin was negative for malignant cells.     01/04/2017 Surgery    Port inserted    01/18/2017 - 03/22/2017 Chemotherapy    Adjuvant Docetaxel 75 mg/m and Cytoxan 600 mg/m, every 21 days, for total of 4 cycles, with Neulasta on day 2.     04/23/2017 - 05/18/2017 Radiation Therapy    Radiation treatment dates:   04/23/17 - 05/18/17 Administered by Dr. Lisbeth Renshaw  Site/dose:    Left breast/ 42.5 Gy in 17 Fx Boost / 7.5 Gy in 3 Fx    06/12/2017 -  Anti-estrogen oral therapy    Adjuvant letrozole 1 mg daily, plan for 5-7 years     07/20/2017 Mammogram    IMPRESSION: No mammographic evidence of malignancy in either breast. 3.8 cm left breast postsurgical loculated seroma.    09/12/2017 Survivorship        HISTORY OF PRESENTING ILLNESS:  Wendy [name] 77 y.o. female is here because of invasive ductal carcinoma of the left breast. She is accompanied by her son to my clinic today. She was referred  by her breast surgeon Dr. Cornett.   About 2 months ago, the patient noted a mass on her left breast. She underwent a mammogram and breast US on 11/01/2016, which showed a 3.1 cm mass in the upper outer quadrant of the left breast with pleomorphic calcifications. Imaging also noted abnormal appearing lymph nodes in the left axilla. A core biopsy was done on 11/07/2016, which revealed invasive ductal carcinoma ER positive PR negative with a Ki-67 of 90%. On November 13, 2016 she met with Dr. Cornett to discuss a  lumpectomy. She presents for further treatment.   She presents with her son today. She felt the lump herself about 2 months ago. She noticed last month that it had gotten a little bigger. So she went to her PCP, who ordered a mammogram. She does not get mammograms every year. Her last mammogram was May 2017, which showed no cancer. She had some pain and itching in her left nipple before her diagnosis. She has some lower back pain if she is laying down too long, but that has been present for a while. Denies any new bone or joint pain, weight loss, breast swelling, nipple discharge, or any other concerns. She is very active and takes care of herself and her husband.   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 They have a nurse for the rest of the week. He has dementia as well as numerous other health problems. Her son has moved in with her to help with her husband. She has a history of HTN and DM. Her DM is not well controlled; she takes medication for this. Her BP is also not well controlled on lisinopril. She has never had a heart attack or stroke. No surgeries in the past other than the removal of her benign lesion. No family history of breast cancer. Her father did have a brain tumor at the age of 77, which he passed from. Never smoker. She does not drink alcohol. She has 9 grandchildren.   GYN HISTORY  Menarchal: 77 y.o LMP: around 77 y.o Contraceptive: n/a HRT: No GP: 4 pregnancies, 4 children (3 boys, 1 girl). 1st child at age 22  CURRENT THERAPY: Adjuvant Letrozole, started 06/12/17  INTVERVAL HISTORY:  Wendy [name] returns for a follow up of her left breast cancer. She was last seen by me 6 motnhs ago. She lost her interim follow up due to the passing of her husband. She presents to the clinic alone. She complains of left breast tenderness, swelling, itching, and burning. She saw a Dr Cornett for this and was given antibiotics and the mass was drained. She is feeling  better now. She will see Dr. Cornett again soon. She denies new joint pain.   MEDICAL HISTORY:  Past Medical History:  Diagnosis Date  . Abnormal breast finding 2018   per pt/ having  a lot drainage from left breast nipple  . Breast cancer (HCC) 11/2016   left/  . GERD (gastroesophageal reflux disease)    TUMS as needed  . HTN (hypertension)    states BP has been high recently; has been on med. x 20 yr.  . Hyperlipidemia   . Insulin dependent diabetes mellitus (HCC)   . Personal history of chemotherapy    2018/finished 6 weeks of chemo in Sep 2018  . Personal history of radiation therapy    2018 left breast/finished radiation in Sept 2018 per pt.    SURGICAL HISTORY: Past Surgical History:    Procedure Laterality Date  . BREAST LUMPECTOMY Left 05/05/2008  . BREAST LUMPECTOMY Left 12/07/2016   malignant  . BREAST LUMPECTOMY WITH RADIOACTIVE SEED AND SENTINEL LYMPH NODE BIOPSY Left 12/07/2016   Procedure: LEFT BREAST LUMPECTOMY WITH RADIOACTIVE SEED AND SENTINEL LYMPH NODE BIOPSY;  Surgeon: Thomas Cornett, MD;  Location: [facility];  Service: General;  Laterality: Left;  . IR FLUORO GUIDE PORT INSERTION RIGHT  01/04/2017  . IR US GUIDE VASC ACCESS RIGHT  01/04/2017  . PORT-A-CATH REMOVAL N/A 05/30/2017   Procedure: REMOVAL PORT-A-CATH;  Surgeon: Cornett, Thomas, MD;  Location: [facility];  Service: General;  Laterality: N/A;  . RE-EXCISION OF BREAST LUMPECTOMY Left 12/28/2016   Procedure: RE-EXCISION OF BREAST LUMPECTOMY;  Surgeon: Thomas Cornett, MD;  Location: [facility];  Service: General;  Laterality: Left;    SOCIAL HISTORY: Social History   Socioeconomic History  . Marital status: Married    Spouse name: Not on file  . Number of children: 4  . Years of education: Not on file  . Highest education level: Not on file  Occupational History  . Occupation: DAY CARE    Employer: [employer]  Social Needs  . Financial resource strain: Not on file   . Food insecurity:    Worry: Not on file    Inability: Not on file  . Transportation needs:    Medical: Not on file    Non-medical: Not on file  Tobacco Use  . Smoking status: Former Smoker    Types: Cigarettes  . Smokeless tobacco: Former User    Types: Snuff    Quit date: 11/30/2017  . Tobacco comment: states she is determined to quit   Substance and Sexual Activity  . Alcohol use: No    Alcohol/week: 0.0 standard drinks  . Drug use: No  . Sexual activity: Not Currently    Partners: Male  Lifestyle  . Physical activity:    Days per week: Not on file    Minutes per session: Not on file  . Stress: Not on file  Relationships  . Social connections:    Talks on phone: Not on file    Gets together: Not on file    Attends religious service: Not on file    Active member of club or organization: Not on file    Attends meetings of clubs or organizations: Not on file    Relationship status: Not on file  . Intimate partner violence:    Fear of current or ex partner: Not on file    Emotionally abused: Not on file    Physically abused: Not on file    Forced sexual activity: Not on file  Other Topics Concern  . Not on file  Social History Narrative  . Not on file    FAMILY HISTORY: Family History  Problem Relation Age of Onset  . Diabetes Sister        x 4  . Diabetes Brother   . Diabetes Paternal Grandmother   . Diabetes Paternal Aunt   . Heart disease Brother   . Heart disease Sister   . Emphysema Brother   . Cancer Father 69       brain tumor   . Colon cancer Neg Hx   . Stomach cancer Neg Hx     ALLERGIES:  has No Known Allergies.  MEDICATIONS:  Current Outpatient Medications  Medication Sig Dispense Refill  . albuterol (PROVENTIL HFA;VENTOLIN HFA) 108 (90 Base) MCG/ACT inhaler Inhale   2 puffs into the lungs every 6 (six) hours as needed for wheezing or shortness of breath. 1 Inhaler 2  . amLODipine (NORVASC) 10 MG tablet TAKE 1 TABLET (10 MG TOTAL) BY MOUTH  DAILY. 90 tablet 1  . aspirin 81 MG tablet Take 81 mg by mouth daily.      . B-D INS SYRINGE 0.5CC/31GX5/16 31G X 5/16" 0.5 ML MISC USE AS DIRECTED 4 TIMES A DAY 300 each 1  . busPIRone (BUSPAR) 7.5 MG tablet TAKE 1 TABLET (7.5 MG TOTAL) BY MOUTH 3 (THREE) TIMES DAILY. 270 tablet 1  . carvedilol (COREG) 25 MG tablet Take 1 tablet (25 mg total) by mouth 2 (two) times daily with a meal. 180 tablet 2  . cloNIDine (CATAPRES) 0.3 MG tablet Take 1 tablet (0.3 mg total) by mouth 2 (two) times daily. 180 tablet 2  . doxycycline (ADOXA) 100 MG tablet Take 100 mg by mouth 2 (two) times daily.    . Insulin Glargine (LANTUS SOLOSTAR) 100 UNIT/ML Solostar Pen Inject 24 units into the skin 10 pen 5  . Insulin Pen Needle 31G X 5 MM MISC Use pen needles for insulin injection daily 100 each 3  . insulin regular (NOVOLIN R,HUMULIN R) 100 units/mL injection Inject 0.1-0.16 mLs (10-16 Units total) into the skin 3 (three) times daily before meals. Relion. Please provide insulin syringes needed 20 mL 11  . Insulin Syringe-Needle U-100 (B-D INS SYRINGE 0.5CC/30GX1/2") 30G X 1/2" 0.5 ML MISC USE AS DIRECTED 4 TIMES A DAY 300 each 3  . letrozole (FEMARA) 2.5 MG tablet Take 1 tablet (2.5 mg total) by mouth daily. 90 tablet 1  . lisinopril-hydrochlorothiazide (PRINZIDE,ZESTORETIC) 10-12.5 MG tablet Take 1 tablet by mouth daily. 90 tablet 3  . lovastatin (MEVACOR) 40 MG tablet Take 1 tablet (40 mg total) by mouth at bedtime. 90 tablet 3  . metFORMIN (GLUCOPHAGE) 500 MG tablet TAKE 2 TABLETS BY MOUTH DAILY WITH SUPPER. 180 tablet 0  . Na Sulfate-K Sulfate-Mg Sulf 17.5-3.13-1.6 GM/177ML SOLN Take 1 kit by mouth as directed. 354 mL 0  . OVER THE COUNTER MEDICATION Take 1 tablet by mouth daily as needed. Stool softner for constipation, also drinks warm prune juice prn     No current facility-administered medications for this visit.     REVIEW OF SYSTEMS:   Constitutional: Denies fevers, chills or abnormal night sweats  Eyes:  Denies blurriness of vision, double vision or watery eyes Ears, nose, mouth, throat, and face: Denies mucositis or sore throat Respiratory: Denies cough, dyspnea or wheezes Cardiovascular: Denies palpitation, chest discomfort or lower extremity swelling, Gastrointestinal:  Denies nausea, heartburn  GU: No new complaints  Skin: Denies abnormal skin rashes  Lymphatics: Denies new lymphadenopathy or easy bruising Neurological:Denies numbness, tingling or new weaknesses Behavioral/Psych: Mood is stable, no new changes  MSK: No new joint pain Breast: (+) Left breast skin pigmentation, (+) left breast swelling, tenderness, and erythema  All other systems were reviewed with the patient and are negative.  PHYSICAL EXAMINATION:  ECOG PERFORMANCE STATUS: 2  Vitals:   07/11/18 0945  BP: 137/62  Pulse: 74  Resp: 17  Temp: 98.3 F (36.8 C)  SpO2: 100%   Filed Weights   07/11/18 0945  Weight: 189 lb 9.6 oz (86 kg)    GENERAL: alert, oriented and answers questions appropriately, no distress and comfortable SKIN: skin color, texture, turgor are normal, no rashes or significant lesions  EYES: normal, conjunctiva are pink and non-injected, sclera clear OROPHARYNX:no exudate, no   erythema and lips, buccal mucosa, and tongue normal  NECK: supple, thyroid normal size, non-tender, without nodularity LYMPH:  no palpable lymphadenopathy in the cervical, axillary or inguinal LUNGS: clear to auscultation and percussion with normal breathing effort HEART: regular rate & rhythm and no murmurs and no lower extremity edema ABDOMEN:abdomen soft, non-tender and normal bowel sounds Musculoskeletal:no cyanosis of digits and no clubbing  PSYCH: alert & oriented x 3 with fluent speech NEURO: no focal motor/sensory deficits Breasts: (+) left breast swelling with diffuse skin erythema and hyperpigmentation, the breast is firm and tender to touch.  There is a large lump in the center of her left breast. No other  palpable mass or adenopathy.  LABORATORY DATA:  I have reviewed the data as listed CBC Latest Ref Rng & Units 07/11/2018 05/10/2018 01/09/2018  WBC 4.0 - 10.5 K/uL 6.8 6.9 4.9  Hemoglobin 12.0 - 15.0 g/dL 9.5(L) 11.4(L) 10.8(L)  Hematocrit 36.0 - 46.0 % 27.6(L) 33.8(L) 31.8(L)  Platelets 150 - 400 K/uL 265 254.0 257   CMP Latest Ref Rng & Units 05/10/2018 01/09/2018 10/10/2017  Glucose 70 - 99 mg/dL 279(H) 190(H) 153(H)  BUN 6 - 23 mg/dL 18 18 21  Creatinine 0.40 - 1.20 mg/dL 1.16 1.10 1.07  Sodium 135 - 145 mEq/L 138 140 140  Potassium 3.5 - 5.1 mEq/L 4.1 3.9 4.3  Chloride 96 - 112 mEq/L 100 104 99  CO2 19 - 32 mEq/L 30 27 33(H)  Calcium 8.4 - 10.5 mg/dL 9.5 9.4 9.7  Total Protein 6.0 - 8.3 g/dL 7.3 6.8 7.2  Total Bilirubin 0.2 - 1.2 mg/dL 0.4 0.3 0.4  Alkaline Phos 39 - 117 U/L 90 86 82  AST 0 - 37 U/L 9 9 8  ALT 0 - 35 U/L 5 6 5   PATHOLOGY: Diagnosis 12/28/16 Breast, excision, left, superior margin - BENIGN FIBROADIPOSE TISSUE WITH HEALING BIOPSY SITE. - BENIGN SKELETAL MUSCLE. - THERE IS NO EVIDENCE OF MALIGNANCY. - SEE COMMENT. Microscopic Comment The surgical resection margin(s) of the specimen were inked and microscopically evaluated. (JBK:ah 01/01/17) JOSHUA KISH MD Pathologist, Electronic Signature (Case signed 01/01/2017)  Mammaprint 12/07/16   Diagnosis 12/07/2016 1. 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 Lymph node, sentinel, biopsy, Left Axillary ONE BENIGN LYMPH NODE (0/1) 3. Lymph node, sentinel, biopsy, Left METASTATIC CARCINOMA IN ONE OF ONE LYMPH NODE (1/1) 4. Breast, excision, Left additional Medial Margin BENIGN BREAST TISSUE Microscopic Comment 1. BREAST, INVASIVE TUMOR Procedure: Lumpectomy Laterality: Left Tumor Size: 3.4 cm Histologic Type: Ductal carcinoma Grade: Tubular Differentiation:  3 Nuclear Pleomorphism: 2 Mitotic Count:3 Ductal Carcinoma in Situ (DCIS): present Extent of Tumor: Skin: negative Nipple: negative Skeletal muscle: negative Margins: Invasive carcinoma, distance from closest margin: invasive carcinoma presented at the superior cauterized margin DCIS, distance from closest margin: 0.1 cm from the superior margin Regional Lymph Nodes: Number of Lymph Nodes Examined: 1 of 3 FINAL for Anand, Islah P ([id number]) Microscopic Comment(continued) Number of Sentinel Lymph Nodes Examined: 2 Lymph Nodes with Macrometastases: 1 Lymph Nodes with Micrometastases: 0 Lymph Nodes with Isolated Tumor Cells: 0 Breast Prognostic Profile: Estrogen Receptor: 80% Progesterone Receptor: 0% Her2: Negative Ki-67: 90% Pathologic Stage Classification (pTNM, AJCC 8th Edition): Primary Tumor (pT): pT2 Regional Lymph Nodes (pN): pN1 Distant Metastases (pM): pMx Zhaoli Lane MD Pathologist, Electronic Signature (Case signed 12/10/2016)  Diagnosis 11/07/2016 1. Breast, left, needle   core biopsy, 1:00 o'clock upper outer quadrant - INVASIVE DUCTAL CARCINOMA. - DUCTAL CARCINOMA IN SITU - SEE COMMENT. 2. Lymph node, needle/core biopsy, inferior left axilla - ONE PARTIALLY SAMPLED BENIGN LYMPH NODE WITH NO TUMOR SEEN. Microscopic Comment 1. Although definitive grading of breast carcinoma is best done on excision, the features of the invasive tumor from the left 1 o'clock upper outer quadrant breast biopsy are compatible with a grade 3 breast carcinoma. Breast prognostic markers will be performed and reported in an addendum. Findings are called to [facility] on 11/08/2016. Dr. Lyndon Code has seen the left 1:00 breast biopsy in consultation with agreement. 2. The findings from the left inferior axillary lymph node are called to the [facility] on 11/08/2016. Dr. Lyndon Code has seen the lymph node biopsy in consultation with agreement. (RH:kh 11/08/16) 1.  FLUORESCENCE IN-SITU HYBRIDIZATION Results: HER2 - NEGATIVE RATIO OF HER2/CEP17 SIGNALS 1.71 AVERAGE HER2 COPY NUMBER PER CELL 2.90 Reference Range: NEGATIVE HER2/CEP17 Ratio <2.0 and average HER2 copy number <4.0 EQUIVOCAL HER2/CEP17 Ratio <2.0 and average HER2 copy number >=4.0 and <6.0 POSITIVE HER2/CEP17 Ratio >=2.0 or <2.0 and average HER2 copy number >=6.0 1. PROGNOSTIC INDICATORS Results: IMMUNOHISTOCHEMICAL AND MORPHOMETRIC ANALYSIS PERFORMED MANUALLY Estrogen Receptor: 80%, POSITIVE, WEAK STAINING INTENSITY Progesterone Receptor: 0%, NEGATIVE Proliferation Marker Ki67: 90% COMMENT: The negative hormone receptor study(ies) in this case has An internal positive control. REFERENCE RANGE ESTROGEN RECEPTOR NEGATIVE 0% POSITIVE =>1% REFERENCE RANGE PROGESTERONE RECEPTOR NEGATIVE 0% POSITIVE =>1%  RADIOGRAPHIC STUDIES: I have personally reviewed the radiological images as listed and agreed with the findings in the report. No results found.   Diagnostic Mammogram and [name] 07/20/17 IMPRESSION: No mammographic evidence of malignancy in either breast. 3.8 cm left breast postsurgical loculated seroma.  Mammogram: 11/01/16 Diagnostic mammogram and ultrasound of left breast and axilla showed a 3.1 x 2.1 x 1.4 cm (3.3 x 2.0 x 2.7 cm by ultrasound) mass in the upper outer quadrant of the anterior third of the left breast, associated with pleomorphic calcification. There is a 8 mm (1.8cm by [name]) prominent lymph node in the left axilla.   ASSESSMENT & PLAN:  Mrs. Seier is a 77 y.o. female who presented with a self palpable left breast mass  1. Malignant neoplasm of upper-outer quadrant of left breast, Invasive Ductal Carcinoma, pT2pN1M0, stage IIB, ER+/PR-/HER2-, G3, mammaprint high risk  -I previously discussed her surgical path result in details, she had reexcision for positive margins, her final surgical margins were negative. -Left lumpectomy and SLN biopsy on 12/07/16 revealed grade 3  IDC measuring 3.4 cm, carcinoma was broadly present at the superior margin, grade 3 DCIS, and 1 out of 2 left axillary SLNs were positive for metastatic carcinoma. With carcinoma broadly present at the superior margin. Excision of the left superior margin on 12/28/16 showed no evidence of malignancy. -Mammaprint showed high risk disease, basal type.The average 10 year risk of recurrence without adjuvant therapy is 29%. -I previously recommend her to consider adjuvant chemotherapy to reduce her risk of recurrence after surgery. -she received adjuvant chemotherapy with 4 cycles of Docetaxel and Cytoxan from April 2018 - June 2018, tolerated well. -She completed RT with Dr. Lisbeth Renshaw 04/23/17-05/18/17 -Port was removed 05/30/17 by Dr. Brantley Stage -she has started adjuvant Letrozole in 06/2017, plan for a total of 7 years if she tolerates well  --She attended Survivorship clinic on Dec 2018 -She is clinically doing well and tolerating Letrozole.. Her physical exam revealed left breast hyperpigmentation, tenderness and swelling. She  saw Dr. Brantley Stage for this and the seroma was drained and she was given antibiotics. She is improving and will f/u with him tomorrow. Her last mammogram from 07/20/17 showed no evidence of malignancy. There is no concern for recurrence.  -She f/u with her PCP every 3 months. I asked her to keep track of her blood counts with them, and call me if her counts drop -Next mammogram and DEXA next month -She will f/u with Dr. Brantley Stage soon -F/u in 6 months   2. HTN and hypotension  -She was previously noticed to be hypertensive in my clinic, and complained of dizziness, she has been on 5 different blood pressure medications, I previously instructed her to hold on hydrochlorothiazide and, and repeat blood pressure at home.  -We gave her 1L NS for this previously  -This was likely medication induced hypotension, no concerns of sepsis  -Her primary care physician Dr. Quay Burow was made aware. I  encouraged her to follow-up with Dr. Quay Burow. -BP normal today at 137/62   3. DM -she is on insulin and metformin -I encouraged her to check her blood glucose in a follow-up with her endocrinologist  4. CKD stage III -She had slightly elevated creatinine at baseline, EGFR 46 previously, probably secondary to her diabetes and hypertension -We'll try to avoid dehydration, and the nephrotoxic medications. -Her CMP was pending today  5. Mild Anemia -Her Hgb decreased to 10.8 on 01/09/18 labs, normal MCV. She has had mild anemia in the past, no signs of GI bleed or other bleeding.  -She is due for a colonoscopy -I previously recommedn she start a multivitamin daily  -Hg at 9.5 today, stable. I will check her iron study, folic acid, U27, to rule out a nutritional anemia on her next visit.  6. Left breast cellulitis -She saw Dr. Brantley Stage for this earlier this week and he prescribes antibiotics and drained the mass. She is improving. -The left breast was still tender to touch and swollen, but has improved according to the patient.  -She will see Dr. Brantley Stage again soon   Plan:  -continue Letrozole  -F/u with Dr. Brantley Stage tomorrow -Mammogram and DEXA scan next month, when cellulitis resolves -Lab and f/u in 6 months   All questions were answered. The patient knows to call the clinic with any problems, questions or concerns.  I spent 20 minutes counseling the patient face to face. The total time spent in the appointment was 25 minutes and more than 50% was on counseling.  Dierdre Searles Dweik am acting as scribe for Dr. [name].  I have reviewed the above documentation for accuracy and completeness, and I agree with the above.     [name], MD 07/11/2018

## 2018-07-11 ENCOUNTER — Telehealth: Payer: Self-pay | Admitting: Hematology

## 2018-07-11 ENCOUNTER — Encounter: Payer: Self-pay | Admitting: Hematology

## 2018-07-11 ENCOUNTER — Inpatient Hospital Stay (HOSPITAL_BASED_OUTPATIENT_CLINIC_OR_DEPARTMENT_OTHER): Payer: Medicare Other | Admitting: Hematology

## 2018-07-11 ENCOUNTER — Inpatient Hospital Stay: Payer: Medicare Other | Attending: Hematology

## 2018-07-11 VITALS — BP 137/62 | HR 74 | Temp 98.3°F | Resp 17 | Ht 62.0 in | Wt 189.6 lb

## 2018-07-11 DIAGNOSIS — I129 Hypertensive chronic kidney disease with stage 1 through stage 4 chronic kidney disease, or unspecified chronic kidney disease: Secondary | ICD-10-CM | POA: Insufficient documentation

## 2018-07-11 DIAGNOSIS — N183 Chronic kidney disease, stage 3 unspecified: Secondary | ICD-10-CM

## 2018-07-11 DIAGNOSIS — Z170 Estrogen receptor positive status [ER+]: Secondary | ICD-10-CM

## 2018-07-11 DIAGNOSIS — I959 Hypotension, unspecified: Secondary | ICD-10-CM | POA: Diagnosis not present

## 2018-07-11 DIAGNOSIS — C50412 Malignant neoplasm of upper-outer quadrant of left female breast: Secondary | ICD-10-CM | POA: Insufficient documentation

## 2018-07-11 DIAGNOSIS — Z79811 Long term (current) use of aromatase inhibitors: Secondary | ICD-10-CM | POA: Insufficient documentation

## 2018-07-11 DIAGNOSIS — Z794 Long term (current) use of insulin: Secondary | ICD-10-CM | POA: Insufficient documentation

## 2018-07-11 DIAGNOSIS — E1122 Type 2 diabetes mellitus with diabetic chronic kidney disease: Secondary | ICD-10-CM

## 2018-07-11 DIAGNOSIS — E2839 Other primary ovarian failure: Secondary | ICD-10-CM

## 2018-07-11 DIAGNOSIS — D649 Anemia, unspecified: Secondary | ICD-10-CM | POA: Insufficient documentation

## 2018-07-11 DIAGNOSIS — N61 Mastitis without abscess: Secondary | ICD-10-CM | POA: Diagnosis not present

## 2018-07-11 DIAGNOSIS — M8580 Other specified disorders of bone density and structure, unspecified site: Secondary | ICD-10-CM

## 2018-07-11 LAB — COMPREHENSIVE METABOLIC PANEL
ALT: <6 U/L (ref 0–44)
AST: 7 U/L — ABNORMAL LOW (ref 15–41)
Albumin: 2.8 g/dL — ABNORMAL LOW (ref 3.5–5.0)
Alkaline Phosphatase: 86 U/L (ref 38–126)
Anion gap: 10 (ref 5–15)
BILIRUBIN TOTAL: 0.2 mg/dL — AB (ref 0.3–1.2)
BUN: 19 mg/dL (ref 8–23)
CO2: 27 mmol/L (ref 22–32)
CREATININE: 1.28 mg/dL — AB (ref 0.44–1.00)
Calcium: 9 mg/dL (ref 8.9–10.3)
Chloride: 98 mmol/L (ref 98–111)
GFR calc Af Amer: 46 mL/min — ABNORMAL LOW (ref >60)
GFR, EST NON AFRICAN AMERICAN: 39 mL/min — AB (ref >60)
Glucose, Bld: 231 mg/dL — ABNORMAL HIGH (ref 70–99)
POTASSIUM: 4.2 mmol/L (ref 3.5–5.1)
Sodium: 135 mmol/L (ref 135–145)
TOTAL PROTEIN: 6.9 g/dL (ref 6.5–8.1)

## 2018-07-11 LAB — CBC WITH DIFFERENTIAL/PLATELET
ABS IMMATURE GRANULOCYTES: 0.05 10*3/uL (ref 0.00–0.07)
BASOS ABS: 0 10*3/uL (ref 0.0–0.1)
BASOS PCT: 0 %
Eosinophils Absolute: 0.2 10*3/uL (ref 0.0–0.5)
Eosinophils Relative: 3 %
HCT: 27.6 % — ABNORMAL LOW (ref 36.0–46.0)
HEMOGLOBIN: 9.5 g/dL — AB (ref 12.0–15.0)
IMMATURE GRANULOCYTES: 1 %
Lymphocytes Relative: 14 %
Lymphs Abs: 0.9 10*3/uL (ref 0.7–4.0)
MCH: 27.5 pg (ref 26.0–34.0)
MCHC: 34.4 g/dL (ref 30.0–36.0)
MCV: 80 fL (ref 80.0–100.0)
MONO ABS: 0.5 10*3/uL (ref 0.1–1.0)
Monocytes Relative: 7 %
NEUTROS ABS: 5.1 10*3/uL (ref 1.7–7.7)
Neutrophils Relative %: 75 %
PLATELETS: 265 10*3/uL (ref 150–400)
RBC: 3.45 MIL/uL — AB (ref 3.87–5.11)
RDW: 14.8 % (ref 11.5–15.5)
WBC: 6.8 10*3/uL (ref 4.0–10.5)
nRBC: 0 % (ref 0.0–0.2)

## 2018-07-11 MED ORDER — LETROZOLE 2.5 MG PO TABS: 2.5000 mg | ORAL_TABLET | Freq: Every day | ORAL | 1 refills | Status: DC

## 2018-07-11 NOTE — Telephone Encounter (Signed)
Appts scheduled letter/calendar mailed per 10/10 los

## 2018-07-11 NOTE — Addendum Note (Signed)
Addended by: [name] on: 07/11/2018 07:40 PM   Modules accepted: Orders

## 2018-07-12 DIAGNOSIS — N611 Abscess of the breast and nipple: Secondary | ICD-10-CM | POA: Diagnosis not present

## 2018-07-12 LAB — CANCER ANTIGEN 27.29: CA 27.29: 9.7 U/mL (ref 0.0–38.6)

## 2018-07-15 ENCOUNTER — Telehealth: Payer: Self-pay

## 2018-07-15 ENCOUNTER — Other Ambulatory Visit: Payer: Self-pay | Admitting: Family Medicine

## 2018-07-15 NOTE — Telephone Encounter (Signed)
Left voice message for patient with lab results, tumor marker WNL, blood glucose is elevated, which should decrease with healing of the left breast infection, recommend f/u with PCP, Creatinine is slightly worse, could be related to elevated BG, instructed to drink 6 to 8 glasses of water daily.

## 2018-07-15 NOTE — Telephone Encounter (Signed)
-----   Message from [name], MD sent at 07/14/2018  1:44 PM EDT ----- Please let pt know the lab results, tumor marker CA27.29 WNL, BG high, which will slow down her healing of left breast infection, recommend her f/u with PCP. Cr slightly worse, probably related to her high BG., make sure she drinks water adequately.  Thanks  [name]  07/14/2018

## 2018-07-16 ENCOUNTER — Encounter: Payer: Self-pay | Admitting: Family Medicine

## 2018-07-16 ENCOUNTER — Ambulatory Visit (INDEPENDENT_AMBULATORY_CARE_PROVIDER_SITE_OTHER): Payer: Medicare Other | Admitting: Family Medicine

## 2018-07-16 ENCOUNTER — Ambulatory Visit: Payer: Self-pay | Admitting: *Deleted

## 2018-07-16 ENCOUNTER — Ambulatory Visit (INDEPENDENT_AMBULATORY_CARE_PROVIDER_SITE_OTHER): Payer: Medicare Other

## 2018-07-16 VITALS — BP 138/80 | HR 80 | Temp 98.7°F | Ht 62.0 in | Wt 187.6 lb

## 2018-07-16 DIAGNOSIS — R0989 Other specified symptoms and signs involving the circulatory and respiratory systems: Secondary | ICD-10-CM | POA: Diagnosis not present

## 2018-07-16 DIAGNOSIS — K625 Hemorrhage of anus and rectum: Secondary | ICD-10-CM

## 2018-07-16 DIAGNOSIS — R0602 Shortness of breath: Secondary | ICD-10-CM | POA: Diagnosis not present

## 2018-07-16 LAB — CBC
HCT: 33 % — ABNORMAL LOW (ref 36.0–46.0)
Hemoglobin: 11 g/dL — ABNORMAL LOW (ref 12.0–15.0)
MCHC: 33.3 g/dL (ref 30.0–36.0)
MCV: 83.9 fl (ref 78.0–100.0)
Platelets: 462 10*3/uL — ABNORMAL HIGH (ref 150.0–400.0)
RBC: 3.94 Mil/uL (ref 3.87–5.11)
RDW: 16.2 % — ABNORMAL HIGH (ref 11.5–15.5)
WBC: 6.1 10*3/uL (ref 4.0–10.5)

## 2018-07-16 LAB — BASIC METABOLIC PANEL
BUN: 15 mg/dL (ref 6–23)
CALCIUM: 9.3 mg/dL (ref 8.4–10.5)
CO2: 29 mEq/L (ref 19–32)
Chloride: 99 mEq/L (ref 96–112)
Creatinine, Ser: 1.06 mg/dL (ref 0.40–1.20)
GFR: 64.64 mL/min (ref >60.00)
GLUCOSE: 248 mg/dL — AB (ref 70–99)
Potassium: 4.6 mEq/L (ref 3.5–5.1)
SODIUM: 135 meq/L (ref 135–145)

## 2018-07-16 LAB — BRAIN NATRIURETIC PEPTIDE: Pro B Natriuretic peptide (BNP): 44 pg/mL (ref 0.0–100.0)

## 2018-07-16 NOTE — Progress Notes (Signed)
Subjective:  Wendy [name] is a 77 y.o. year old very pleasant female patient who presents for/with See problem oriented charting ROS- slight chest tightness and SOb yesterday resolved with albuterol. In general has felt more winded with exertion such as walking into the building today. Weight up 5 lbs since last visit here about 2 weeks ago  Past Medical History-  Patient Active Problem List   Diagnosis Date Noted  . Malignant neoplasm of upper-outer quadrant of left breast in female, estrogen receptor positive ([place]) 11/20/2016    Priority: High  . 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 Chronic diastolic heart failure ([place]) 07/06/2011    Priority: High  . Rectal bleeding 01/17/2017    Priority: Medium  . CKD (chronic kidney disease) stage 3, GFR 30-59 ml/min (HCC) 03/17/2016    Priority: Medium  . Osteopenia 01/05/2016    Priority: Medium  . TOBACCO USE 04/13/2008    Priority: Medium  . Hyperlipidemia 11/29/2006    Priority: Medium  . OBESITY, BMI 30-35 11/29/2006    Priority: Medium  . HYPERTENSION, BENIGN SYSTEMIC 11/29/2006    Priority: Medium  . Fatigue 03/14/2017    Priority: Low  . Edema 03/14/2017    Priority: Low  . Port catheter in place 01/18/2017    Priority: Low  . Constipation 01/17/2017    Priority: Low  . Seasonal allergies 12/01/2013    Priority: Low  . Grief 05/10/2018  . Anemia 01/09/2018    Medications- reviewed and updated Current Outpatient Medications  Medication Sig Dispense Refill  . albuterol (PROVENTIL HFA;VENTOLIN HFA) 108 (90 Base) MCG/ACT inhaler Inhale 2 puffs into the lungs every 6 (six) hours as needed for wheezing or shortness of breath. 1 Inhaler 2  . amLODipine (NORVASC) 10 MG tablet TAKE 1 TABLET (10 MG TOTAL) BY MOUTH DAILY. 90 tablet 1  . aspirin 81 MG tablet Take 81 mg by mouth daily.      . B-D INS  SYRINGE 0.5CC/31GX5/16 31G X 5/16" 0.5 ML MISC USE AS DIRECTED 4 TIMES A DAY 300 each 1  . busPIRone (BUSPAR) 7.5 MG tablet TAKE 1 TABLET (7.5 MG TOTAL) BY MOUTH 3 (THREE) TIMES DAILY. 270 tablet 1  . carvedilol (COREG) 25 MG tablet Take 1 tablet (25 mg total) by mouth 2 (two) times daily with a meal. 180 tablet 2  . cloNIDine (CATAPRES) 0.3 MG tablet Take 1 tablet (0.3 mg total) by mouth 2 (two) times daily. 180 tablet 2  . doxycycline (ADOXA) 100 MG tablet Take 100 mg by mouth 2 (two) times daily.    . Insulin Glargine (LANTUS SOLOSTAR) 100 UNIT/ML Solostar Pen Inject 24 units into the skin 10 pen 5  . Insulin Pen Needle 31G X 5 MM MISC Use pen needles for insulin injection daily 100 each 3  . insulin regular (NOVOLIN R,HUMULIN R) 100 units/mL injection Inject 0.1-0.16 mLs (10-16 Units total) into the skin 3 (three) times daily before meals. Relion. Please provide insulin syringes needed 20 mL 11  . Insulin Syringe-Needle U-100 (B-D INS SYRINGE 0.5CC/30GX1/2") 30G X 1/2" 0.5 ML MISC USE AS DIRECTED 4 TIMES A DAY 300 each 3  . letrozole (FEMARA) 2.5 MG tablet Take 1 tablet (2.5 mg total) by mouth daily. 90 tablet 1  . lisinopril-hydrochlorothiazide (PRINZIDE,ZESTORETIC) 10-12.5 MG tablet TAKE 1 TABLET BY MOUTH EVERY DAY 90 tablet 3  . lovastatin (MEVACOR)  40 MG tablet Take 1 tablet (40 mg total) by mouth at bedtime. 90 tablet 3  . metFORMIN (GLUCOPHAGE) 500 MG tablet TAKE 2 TABLETS BY MOUTH DAILY WITH SUPPER. 180 tablet 0  . Na Sulfate-K Sulfate-Mg Sulf 17.5-3.13-1.6 GM/177ML SOLN Take 1 kit by mouth as directed. 354 mL 0  . OVER THE COUNTER MEDICATION Take 1 tablet by mouth daily as needed. Stool softner for constipation, also drinks warm prune juice prn     No current facility-administered medications for this visit.     Objective: BP 138/80 (BP Location: Left Arm, Patient Position: Sitting, Cuff Size: Large)   Pulse 80   Temp 98.7 F (37.1 C) (Oral)   Ht _0  (1.575 m)   Wt 187 lb 9.6  oz (85.1 kg)   LMP  (LMP Unknown)   SpO2 100%   BMI 34.31 kg/m  Gen: NAD, resting comfortably CV: RRR no murmurs rubs or gallops Lungs: CTAB no crackles, wheeze, rhonchi Abdomen: soft/tender throughout left lower quadrant and wraps around to the back in lower left back- no rash noted over area/nondistended/normal bowel sounds.   Ext: no edema Skin: warm, dry Neuro: grossly normal, moves all extremities Rectal: small tear on right side of rectum about 3 PM- no active bleeding noted. Some hemorrhoid tags at bottom of rectum without obvious blood  Dg Chest 2 View  Result Date: 07/16/2018 CLINICAL DATA:  Chest congestion for 6 weeks. EXAM: CHEST - 2 VIEW COMPARISON:  03/14/2017 FINDINGS: Cardiomegaly. No confluent airspace opacities or effusions. No overt edema. No acute bony abnormality. Interval removal of right Port-A-Cath. IMPRESSION: Cardiomegaly.  No active disease. Electronically Signed   By: Rolm Baptise M.D.   On: 07/16/2018 16:05   I personally reviewed x-ray results and noted cardiomegaly without acute cardiopulmonary disease. Port a cath noted in place.   Assessment/Plan:  Rectal bleeding - Plan: CBC, Basic metabolic panel, Ambulatory referral to Gastroenterology, CBC  Shortness of breath - Plan: DG Chest 2 View, B Nat Peptide S: Patient noted some blood on her tissue when she wiped this morning after having a bowel movement.  She reports some mild straining but denies stool being very firm.  She does note that her stools were very dark this morning.  She reports some abdominal pain as well as fatigue. She has had pain in left l.ower abdomen wrapping around to her back for about 2 weeks. Saw Dr. Rogers Blocker and discussed possible x-ray of SI joint which she declined at that time. Was to try to have good BM, try tylenol and heating pad- mild relief at times with those- issues seem to come and go. Taking a laxative about every 4 days- half glass of prune juice and miralax. Patient was  having right lower abdominal pain earlier this year and Ct abd/pelvis largely reassuring other than some constipation. There was question of some debris in bladder- no blood on recent UA within 2 weeks.   She reports some shortness of breath over last few months that is not worsening. Yesterday along with this felt some mild diffuse chest tightness- resolved with use of albuterol. She feels in general getting more winded walking.   She did have breast seroma that was treated with drainage by Dr. Brantley Stage and placed on antibiotics last week doxycycline- she is still on this- will finish on Friday. She feels improving.  A/P: 77 year old female with rectal bleeding looks like related to constipation and small tear but also with dyspnea on exertion.  There was the faintest blue line on hemoccult today- I wonder if this was from where my hand contacted her small tear at 3 pm on rectum. With that being said I dont see record of last colonoscopy though mentiones done 2005- she would have been due in 2015 for repeat screening before age 25 but this was never done- will refer back to GI.   Today, Anemia actually improved on CBC- I was wondering if anemia was cause of her shortness of breath. We will get another repeat later this week to make sure not downtrend ing. Looks like patient scheduled with me later this week instead of lab visit- we will check in on her symptoms at that time.   Has had some weight gain but no obvious fluid overload on exam or CXR. Also no pneumonia. Given her plethora of cardiac risk factors will refer to cardiology for further evaluation of shortness of breath- GI may want that before colonoscopy anyway. Did not get EKG today- decision for cardiology was decided on once noted normal CXR and no worsening anemia  Strict ER precautions noted. Also see AVS  Future Appointments  Date Time Provider [place]  07/19/2018 11:15 AM Marin Olp, MD [facility]  08/12/2018  10:45 AM Marin Olp, MD [facility]  09/05/2018 10:15 AM Philemon Kingdom, MD [facility] None  01/10/2019  9:15 AM [facility] LAB 2 [facility] None  01/10/2019  9:45 AM [name], MD [facility] None   Time Stamp The duration of face-to-face time during this visit was greater than 25 minutes. Greater than 50% of this time was spent in counseling, explanation of diagnosis, planning of further management, and/or coordination of care including discussion of need for rectal exam, importance of ER trip if worsening, offering current ER trip given DOE, discussing follow up plan and needed next steps like Gi referral.    Lab/Order associations: Rectal bleeding - Plan: CBC, Basic metabolic panel, Ambulatory referral to Gastroenterology, CBC  Shortness of breath - Plan: DG Chest 2 View, B Nat Peptide  Return precautions advised.  Garret Reddish, MD

## 2018-07-16 NOTE — Patient Instructions (Addendum)
I think constipation may be your main issue.  Does look like you have a slight tear on the right side of your rectum.  There was a hint of blood on Hemoccult card but that may have where I passed the tear with my finger.  Lets get blood work to make sure you are not getting more anemic and I want to repeat this on Friday as well.  Please schedule a lab visit on Monday.  Since your last colonoscopy was in 2005 per the chart (though I cant find the report)-I am going to refer you back for consideration of colonoscopy.  I want you to use a capful of miralax daily with full glass of water. Also try to drink an extra glass of water daily due to possible dehydration on last labs. Hold for 2 days if you have loose stools then start back at half capful. Stop for 2 days if you have loose stools on half capful daily then can go to half capful every other day  If you have chest pain, shortness of breath, worsening fatigue, more blood in your stool-seek care immediately such as in the emergency room if after hours

## 2018-07-16 NOTE — Telephone Encounter (Signed)
See note

## 2018-07-16 NOTE — Telephone Encounter (Signed)
Patient is calling to report that she had bleeding when she wiped this morning. She reports she has had history of constipation- but she reports that her stool was not too hard today although she did stain a little. She does report a change in color - it is very dark today.Patient reports she is fatigued and she has abdominal pain. Call to office- ok to schedule in office.  Reason for Disposition . Tarry or jet black-colored stool (not dark green)    Patient reports dark stool today- call to office- OK to schdule  Answer Assessment - Initial Assessment Questions 1. APPEARANCE of BLOOD: "What color is it?" "Is it passed separately, on the surface of the stool, or mixed in with the stool?"      Bright red blood, patient saw blood when wiped today 2. AMOUNT: "How much blood was passed?"      Small amount on tissue 3. FREQUENCY: "How many times has blood been passed with the stools?"      1 time this morning 4. ONSET: "When was the blood first seen in the stools?" (Days or weeks)      This morning 5. DIARRHEA: "Is there also some diarrhea?" If so, ask: "How many diarrhea stools were passed in past 24 hours?"      no 6. CONSTIPATION: "Do you have constipation?" If so, "How bad is it?"     Patient had to strain slightly- does have constipation hx 7. RECURRENT SYMPTOMS: "Have you had blood in your stools before?" If so, ask: "When was the last time?" and "What happened that time?"      no 8. BLOOD THINNERS: "Do you take any blood thinners?" (e.g., Coumadin/warfarin, Pradaxa/dabigatran, aspirin)     no 9. OTHER SYMPTOMS: "Do you have any other symptoms?"  (e.g., abdominal pain, vomiting, dizziness, fever)     Fatigue, abdominal pain,weakness- nausea 10. PREGNANCY: "Is there any chance you are pregnant?" "When was your last menstrual period?"       n/a  Protocols used: RECTAL BLEEDING-A-AH

## 2018-07-16 NOTE — Telephone Encounter (Signed)
Patient seen in office and examined by Dr. Yong Channel this morning

## 2018-07-17 ENCOUNTER — Other Ambulatory Visit: Payer: Self-pay

## 2018-07-17 DIAGNOSIS — R0609 Other forms of dyspnea: Principal | ICD-10-CM

## 2018-07-19 ENCOUNTER — Encounter: Payer: Self-pay | Admitting: Family Medicine

## 2018-07-19 ENCOUNTER — Ambulatory Visit (INDEPENDENT_AMBULATORY_CARE_PROVIDER_SITE_OTHER): Payer: Medicare Other | Admitting: Family Medicine

## 2018-07-19 VITALS — BP 138/78 | HR 91 | Temp 97.9°F | Ht 62.0 in | Wt 189.6 lb

## 2018-07-19 DIAGNOSIS — Z794 Long term (current) use of insulin: Secondary | ICD-10-CM

## 2018-07-19 DIAGNOSIS — E1165 Type 2 diabetes mellitus with hyperglycemia: Secondary | ICD-10-CM | POA: Diagnosis not present

## 2018-07-19 DIAGNOSIS — E113419 Type 2 diabetes mellitus with severe nonproliferative diabetic retinopathy with macular edema, unspecified eye: Secondary | ICD-10-CM | POA: Diagnosis not present

## 2018-07-19 DIAGNOSIS — R0602 Shortness of breath: Secondary | ICD-10-CM

## 2018-07-19 DIAGNOSIS — IMO0002 Reserved for concepts with insufficient information to code with codable children: Secondary | ICD-10-CM

## 2018-07-19 DIAGNOSIS — K625 Hemorrhage of anus and rectum: Secondary | ICD-10-CM | POA: Diagnosis not present

## 2018-07-19 LAB — CBC
HCT: 33.3 % — ABNORMAL LOW (ref 36.0–46.0)
Hemoglobin: 11.2 g/dL — ABNORMAL LOW (ref 12.0–15.0)
MCHC: 33.5 g/dL (ref 30.0–36.0)
MCV: 83.4 fl (ref 78.0–100.0)
PLATELETS: 439 10*3/uL — AB (ref 150.0–400.0)
RBC: 3.99 Mil/uL (ref 3.87–5.11)
RDW: 16.3 % — AB (ref 11.5–15.5)
WBC: 8.2 10*3/uL (ref 4.0–10.5)

## 2018-07-19 NOTE — Assessment & Plan Note (Signed)
S: Seen for rectal bleeding 3 days ago.  Fortunately no drop in hemoglobin at that time.  We planned for repeat hemoglobin check today.  No further blood in the stool. Her left lower side and back pain is better as well. She has had more regular BMs and that may have helped improve her pain.  A/P: We will get another CBC today.  Thankfully rectal bleeding has resolved.  Suspect hemorrhoid or slight rectal tear related as noted last visit.  Still think she needs a colonoscopy-see prior notes from October 2018 on this topic.  If this colonoscopy is normal would not recommend another colonoscopy

## 2018-07-19 NOTE — Progress Notes (Signed)
Subjective:  Wendy [name] is a 77 y.o. year old very pleasant female patient who presents for/with See problem oriented charting ROS-shortness of breath much improved.  Chest wall pain from  seroma improving.  No fever or chills.  Slight edema.  No further rectal bleeding.    Past Medical History-  Patient Active Problem List   Diagnosis Date Noted  . Malignant neoplasm of upper-outer quadrant of left breast in female, estrogen receptor positive ([place]) 11/20/2016    Priority: High  . 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 Chronic diastolic heart failure ([place]) 07/06/2011    Priority: High  . Rectal bleeding 01/17/2017    Priority: Medium  . CKD (chronic kidney disease) stage 3, GFR 30-59 ml/min (HCC) 03/17/2016    Priority: Medium  . Osteopenia 01/05/2016    Priority: Medium  . TOBACCO USE 04/13/2008    Priority: Medium  . Hyperlipidemia 11/29/2006    Priority: Medium  . OBESITY, BMI 30-35 11/29/2006    Priority: Medium  . HYPERTENSION, BENIGN SYSTEMIC 11/29/2006    Priority: Medium  . Fatigue 03/14/2017    Priority: Low  . Edema 03/14/2017    Priority: Low  . Port catheter in place 01/18/2017    Priority: Low  . Constipation 01/17/2017    Priority: Low  . Seasonal allergies 12/01/2013    Priority: Low  . Grief 05/10/2018  . Anemia 01/09/2018  . Shortness of breath 03/14/2017    Medications- reviewed and updated Current Outpatient Medications  Medication Sig Dispense Refill  . amLODipine (NORVASC) 10 MG tablet TAKE 1 TABLET (10 MG TOTAL) BY MOUTH DAILY. 90 tablet 1  . aspirin 81 MG tablet Take 81 mg by mouth daily.      . B-D INS SYRINGE 0.5CC/31GX5/16 31G X 5/16" 0.5 ML MISC USE AS DIRECTED 4 TIMES A DAY 300 each 1  . busPIRone (BUSPAR) 7.5 MG tablet TAKE 1 TABLET (7.5 MG TOTAL) BY MOUTH 3 (THREE) TIMES DAILY. 270 tablet 1  .  carvedilol (COREG) 25 MG tablet Take 1 tablet (25 mg total) by mouth 2 (two) times daily with a meal. 180 tablet 2  . cloNIDine (CATAPRES) 0.3 MG tablet Take 1 tablet (0.3 mg total) by mouth 2 (two) times daily. 180 tablet 2  . doxycycline (ADOXA) 100 MG tablet Take 100 mg by mouth 2 (two) times daily.    . Insulin Glargine (LANTUS SOLOSTAR) 100 UNIT/ML Solostar Pen Inject 24 units into the skin 10 pen 5  . Insulin Pen Needle 31G X 5 MM MISC Use pen needles for insulin injection daily 100 each 3  . insulin regular (NOVOLIN R,HUMULIN R) 100 units/mL injection Inject 0.1-0.16 mLs (10-16 Units total) into the skin 3 (three) times daily before meals. Relion. Please provide insulin syringes needed 20 mL 11  . Insulin Syringe-Needle U-100 (B-D INS SYRINGE 0.5CC/30GX1/2") 30G X 1/2" 0.5 ML MISC USE AS DIRECTED 4 TIMES A DAY 300 each 3  . letrozole (FEMARA) 2.5 MG tablet Take 1 tablet (2.5 mg total) by mouth daily. 90 tablet 1  . lisinopril-hydrochlorothiazide (PRINZIDE,ZESTORETIC) 10-12.5 MG tablet TAKE 1 TABLET BY MOUTH EVERY DAY 90 tablet 3  . lovastatin (MEVACOR) 40 MG tablet Take 1 tablet (40 mg total) by mouth at bedtime. 90 tablet 3  . metFORMIN (GLUCOPHAGE) 500 MG tablet TAKE 2 TABLETS BY MOUTH DAILY WITH SUPPER. 180 tablet 0  .  Na Sulfate-K Sulfate-Mg Sulf 17.5-3.13-1.6 GM/177ML SOLN Take 1 kit by mouth as directed. 354 mL 0  . OVER THE COUNTER MEDICATION Take 1 tablet by mouth daily as needed. Stool softner for constipation, also drinks warm prune juice prn     No current facility-administered medications for this visit.     Objective: BP 138/78 (BP Location: Left Arm, Patient Position: Sitting, Cuff Size: Large)   Pulse 91   Temp 97.9 F (36.6 C) (Oral)   Ht _0  (1.575 m)   Wt 189 lb 9.6 oz (86 kg)   LMP  (LMP Unknown)   SpO2 99%   BMI 34.68 kg/m  Gen: NAD, resting comfortably CV: RRR no murmurs rubs or gallops Lungs: CTAB no crackles, wheeze, rhonchi Abdomen: soft/nontender- no  longer tender in LLQ/nondistended/normal bowel sounds. No rebound or guarding.  Ext: trace edema Skin: warm, dry  Assessment/Plan:  Other notes: 1.dog died the other day - another thing to deal with - with loss of husband just 2 months ago.    Rectal bleeding S: Seen for rectal bleeding 3 days ago.  Fortunately no drop in hemoglobin at that time.  We planned for repeat hemoglobin check today.  No further blood in the stool. Her left lower side and back pain is better as well. She has had more regular BMs and that may have helped improve her pain.  A/P: We will get another CBC today.  Thankfully rectal bleeding has resolved.  Suspect hemorrhoid or slight rectal tear related as noted last visit.  Still think she needs a colonoscopy-see prior notes from October 2018 on this topic.  If this colonoscopy is normal would not recommend another colonoscopy  Shortness of breath S: Patient was also complaining of shortness of breath at last visit even with walking into the building.  Chest x-ray largely reassuring.  BNP not elevated.  Thought anemia may be cause but since not anemic- thought likely due to other condition. We opted to refer to cardiology given her multitude of risk factors.  This could simply be related to deconditioning.  She has a long history of tobacco abuse with dip/chew but not of smoking- we have not formally tested for asthma or COPD (doesn't really have good reason for COPD though).   Today, she states she realized she had missed several days of lisinopril when she was feeling short winded. She started the lisinopril back yesterday and shortness of breath has largely resolved. Can now walk to car without issue- would have to walk a lot at this point ot feel short of breath. She also has been on antibiotics in this time for seroma- perhaps able to take a deeper breath now with less pain- seroma and associated pain could have contributed to sensation of shortness of breath.  She reports  Albuterol didn't help when tried.   A/P: Patient reports only minimal shortness of breath at this point with treatment of seroma and being back on lisinopril.  We will cancel cardiology referral-patient will return to see me if her shortness of breath fails to continue to improve or symptoms worsen.  Given lack of improvement on albuterol doubt COPD or asthma.  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 Had a low blood sugar when didn't eat well one evening- encouraged her to either cut insulin in half or make  sure to eat the full meal.  A/P: We discussed the importance of avoiding hypoglycemia. From AVS:  " If any more low blood sugars- call Dr. Cruzita Lederer to get a plan together. If you feel you are not going to eat well- reduce your insulin in half for that meal.  "   Future Appointments  Date Time Provider [place]  08/12/2018 10:45 AM Marin Olp, MD [facility]  09/05/2018 10:15 AM Philemon Kingdom, MD [facility] None  09/19/2018 10:00 AM [facility] DIAG TOMO 1 [facility]  09/19/2018 11:00 AM [facility] DX DEXA 1 [facility] GI[facility]BREAST [facility]  01/10/2019  9:15 AM [facility] LAB 2 [facility] None  01/10/2019  9:45 AM [name], MD [facility] None   May move appointment back 1 month into December. If your shortness of breath does not continue to improve- see [name] back immediately.   Also want to see you back for rectal bleeding if recurs. Please see GI still for colonoscopy.   Return precautions advised.  Garret Reddish, MD

## 2018-07-19 NOTE — Assessment & Plan Note (Signed)
S:. Had a low blood sugar when didn't eat well one evening- encouraged her to either cut insulin in half or make sure to eat the full meal.  A/P: We discussed the importance of avoiding hypoglycemia. From AVS:  " If any more low blood sugars- call Dr. Cruzita Lederer to get a plan together. If you feel you are not going to eat well- reduce your insulin in half for that meal.  "

## 2018-07-19 NOTE — Assessment & Plan Note (Signed)
S: Patient was also complaining of shortness of breath at last visit even with walking into the building.  Chest x-ray largely reassuring.  BNP not elevated.  Thought anemia may be cause but since not anemic- thought likely due to other condition. We opted to refer to cardiology given her multitude of risk factors.  This could simply be related to deconditioning.  She has a long history of tobacco abuse with dip/chew but not of smoking- we have not formally tested for asthma or COPD (doesn't really have good reason for COPD though).   Today, she states she realized she had missed several days of lisinopril when she was feeling short winded. She started the lisinopril back yesterday and shortness of breath has largely resolved. Can now walk to car without issue- would have to walk a lot at this point ot feel short of breath. She also has been on antibiotics in this time for seroma- perhaps able to take a deeper breath now with less pain- seroma and associated pain could have contributed to sensation of shortness of breath.  She reports Albuterol didn't help when tried.   A/P: Patient reports only minimal shortness of breath at this point with treatment of seroma and being back on lisinopril.  We will cancel cardiology referral-patient will return to see me if her shortness of breath fails to continue to improve or symptoms worsen.  Given lack of improvement on albuterol doubt COPD or asthma.  She denies chest pain outside of the seroma which once again is improving

## 2018-07-19 NOTE — Patient Instructions (Addendum)
Please stop by lab before you go  May move appointment back 1 month into December. If your shortness of breath does not continue to improve- see [name] back immediately.   Also want to see you back for rectal bleeding if recurs. Please see GI still for colonoscopy.   If any more low blood sugars- call Dr. Cruzita Lederer to get a plan together. If you feel you are not going to eat well- reduce your insulin in half for that meal.   Our team will cancel your cardiology referral since you are doing so much better but if you dont continue to improve- let us know as already stated.

## 2018-07-29 ENCOUNTER — Telehealth: Payer: Self-pay | Admitting: Internal Medicine

## 2018-07-29 NOTE — Telephone Encounter (Signed)
Per [facility]-Caller states her blood sugar keeps dropping, today it was 20 this morning, then she ate something and it was 48 and is real tired now.--This has occurred 3 times this month.

## 2018-07-30 NOTE — Telephone Encounter (Signed)
I need to know in which conditions and at what time of the day are her sugars dropping to decide how to change her insulin regimen.  Can you obtain more information about this? Per my records, she is on the following regimen-please confirm that she is taking this: - Metformin 1000 mg 2x a day with meals - Lantus 24 units at night - Novolog before a meal: -10 units with a smaller meal -12 units with a regular meal -14-16 units with a large meal/if you eat out/if you have dessert

## 2018-07-31 ENCOUNTER — Ambulatory Visit: Payer: Self-pay | Admitting: *Deleted

## 2018-07-31 NOTE — Telephone Encounter (Signed)
Patient states her sugars drop in the morning when she wakes up, and will drop again in the PM if she does not eat every 3 hours, she confirms her medication with the exception of evening insulin because she wakes up with low sugar.

## 2018-07-31 NOTE — Telephone Encounter (Signed)
FYI

## 2018-07-31 NOTE — Telephone Encounter (Signed)
Okay, let us make the following changes: Please continue: - Metformin 1000 mg 2x a day with meals  Restart: - Lantus 24 >> 18 units at night  Decrease: - Novolog before a meal: -10 >> 8 units with a smaller meal -12 >> 9 units with a regular meal -14-16 >> 10 units with a large meal/if you eat out/if you have dessert  Please stay with the 8 to 9 units of NovoLog until the sugars stop dropping.  Please let us know how she is doing in few days on the above regimen.

## 2018-07-31 NOTE — Telephone Encounter (Signed)
Pt called with shortness of breath that started last week; she was seen in the office on 07/19/18 and she says that this issue has not gotten better; the pt says that she has shortness of breath with exertion; she requests an appointment with Dr Yong Channel, [facility], on 08/01/18; recommendations made per nurse triage protocol; pt offered and accepted appointment with Dr Yong Channel on 08/01/18 at 1415; will route to office for notification of this upcoming appointment.  Reason for Disposition . [1] MODERATE longstanding difficulty breathing (e.g., speaks in phrases, SOB even at rest, pulse 100-120) AND [2] SAME as normal  Answer Assessment - Initial Assessment Questions 1. RESPIRATORY STATUS: "Describe your breathing?" (e.g., wheezing, shortness of breath, unable to speak, severe coughing)      Short of breath 2. ONSET: "When did this breathing problem begin?"      Seen in office on 07/16/18 3. PATTERN "Does the difficult breathing come and go, or has it been constant since it started?"      Worse with exertion 4. SEVERITY: "How bad is your breathing?" (e.g., mild, moderate, severe)    - MILD: No SOB at rest, mild SOB with walking, speaks normally in sentences, can lay down, no retractions, pulse < 100.    - MODERATE: SOB at rest, SOB with minimal exertion and prefers to sit, cannot lie down flat, speaks in phrases, mild retractions, audible wheezing, pulse 100-120.    - SEVERE: Very SOB at rest, speaks in single words, struggling to breathe, sitting hunched forward, retractions, pulse > 120      moderate 5. RECURRENT SYMPTOM: "Have you had difficulty breathing before?" If so, ask: "When was the last time?" and "What happened that time?"      no 6. CARDIAC HISTORY: "Do you have any history of heart disease?" (e.g., heart attack, angina, bypass surgery, angioplasty)      High blood pressure 7. LUNG HISTORY: "Do you have any history of lung disease?"  (e.g., pulmonary embolus, asthma,  emphysema)     no 8. CAUSE: "What do you think is causing the breathing problem?"      Infection from left breast; MD punched hole in it to drain it 9. OTHER SYMPTOMS: "Do you have any other symptoms? (e.g., dizziness, runny nose, cough, chest pain, fever)     Legs feel heavy when moving, non-productive cough 10. PREGNANCY: "Is there any chance you are pregnant?" "When was your last menstrual period?"       no 11. TRAVEL: "Have you traveled out of the country in the last month?" (e.g., travel history, exposures)       no  Protocols used: BREATHING DIFFICULTY-A-AH

## 2018-08-01 ENCOUNTER — Encounter: Payer: Self-pay | Admitting: Family Medicine

## 2018-08-01 ENCOUNTER — Ambulatory Visit (INDEPENDENT_AMBULATORY_CARE_PROVIDER_SITE_OTHER): Payer: Medicare Other | Admitting: Family Medicine

## 2018-08-01 VITALS — BP 136/72 | HR 90 | Temp 97.7°F | Ht 62.0 in | Wt 187.4 lb

## 2018-08-01 DIAGNOSIS — R0602 Shortness of breath: Secondary | ICD-10-CM | POA: Diagnosis not present

## 2018-08-01 DIAGNOSIS — N183 Chronic kidney disease, stage 3 unspecified: Secondary | ICD-10-CM

## 2018-08-01 DIAGNOSIS — IMO0002 Reserved for concepts with insufficient information to code with codable children: Secondary | ICD-10-CM

## 2018-08-01 DIAGNOSIS — E113419 Type 2 diabetes mellitus with severe nonproliferative diabetic retinopathy with macular edema, unspecified eye: Secondary | ICD-10-CM | POA: Diagnosis not present

## 2018-08-01 DIAGNOSIS — Z794 Long term (current) use of insulin: Secondary | ICD-10-CM

## 2018-08-01 DIAGNOSIS — H43393 Other vitreous opacities, bilateral: Secondary | ICD-10-CM

## 2018-08-01 DIAGNOSIS — E1165 Type 2 diabetes mellitus with hyperglycemia: Secondary | ICD-10-CM

## 2018-08-01 LAB — GLUCOSE, POCT (MANUAL RESULT ENTRY): POC GLUCOSE: 245 mg/dL — AB (ref 70–99)

## 2018-08-01 NOTE — Telephone Encounter (Signed)
Left message for patient to return our call at [phone].  

## 2018-08-01 NOTE — Assessment & Plan Note (Signed)
Also with floaters S: Floaters on and off also noted- seems worse in mornings. We did CBG to make sure not extremely elevated- was elevated into 200s but not excessively high  Working with endocrinology to get blood sugar level down.  A/P: continue endocrine follow up- for floaters advised close optho follow up- she agrees to call to schedule

## 2018-08-01 NOTE — Patient Instructions (Addendum)
Please see your eye doctor to further evaluate the floaters  We will call you within two weeks about your referral to cardiology and for echocardiogram to further evaluate your shortness of breath. If you do not hear within 3 weeks, give [name] a call.  If you have worsening symptoms before you see them like fever, worsening shortness of breath, chest pain with exertion- seek care immediately  Blood pressure recheck  Please keep working with endocrine to get your sugar levels under better control

## 2018-08-01 NOTE — Assessment & Plan Note (Signed)
Doubt SOB anemia related as was stable in 11 range just 2 weeks ago and has not had further rectal bleeding

## 2018-08-01 NOTE — Progress Notes (Signed)
Subjective:  Wendy [name] is a 77 y.o. year old very pleasant female patient who presents for/with See problem oriented charting ROS-patient has some pain over her seroma site-states not bad enough that it is affecting her breathing, complains of shortness of breath.  Has some baseline edema but no recent worsening.  No calf pain.   Past Medical History-  Patient Active Problem List   Diagnosis Date Noted  . Shortness of breath 03/14/2017    Priority: High  . Malignant neoplasm of upper-outer quadrant of left breast in female, estrogen receptor positive ([place]) 11/20/2016    Priority: High  . 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 Chronic diastolic heart failure ([place]) 07/06/2011    Priority: High  . Rectal bleeding 01/17/2017    Priority: Medium  . CKD (chronic kidney disease) stage 3, GFR 30-59 ml/min (HCC) 03/17/2016    Priority: Medium  . Osteopenia 01/05/2016    Priority: Medium  . TOBACCO USE 04/13/2008    Priority: Medium  . Hyperlipidemia 11/29/2006    Priority: Medium  . OBESITY, BMI 30-35 11/29/2006    Priority: Medium  . HYPERTENSION, BENIGN SYSTEMIC 11/29/2006    Priority: Medium  . Fatigue 03/14/2017    Priority: Low  . Edema 03/14/2017    Priority: Low  . Port catheter in place 01/18/2017    Priority: Low  . Constipation 01/17/2017    Priority: Low  . Seasonal allergies 12/01/2013    Priority: Low  . Grief 05/10/2018  . Anemia 01/09/2018    Medications- reviewed and updated Current Outpatient Medications  Medication Sig Dispense Refill  . amLODipine (NORVASC) 10 MG tablet TAKE 1 TABLET (10 MG TOTAL) BY MOUTH DAILY. 90 tablet 1  . aspirin 81 MG tablet Take 81 mg by mouth daily.      . B-D INS SYRINGE 0.5CC/31GX5/16 31G X 5/16" 0.5 ML MISC USE AS DIRECTED 4 TIMES A DAY 300 each 1  . busPIRone (BUSPAR) 7.5 MG tablet TAKE 1  TABLET (7.5 MG TOTAL) BY MOUTH 3 (THREE) TIMES DAILY. 270 tablet 1  . carvedilol (COREG) 25 MG tablet Take 1 tablet (25 mg total) by mouth 2 (two) times daily with a meal. 180 tablet 2  . cloNIDine (CATAPRES) 0.3 MG tablet Take 1 tablet (0.3 mg total) by mouth 2 (two) times daily. 180 tablet 2  . doxycycline (ADOXA) 100 MG tablet Take 100 mg by mouth 2 (two) times daily.    . Insulin Glargine (LANTUS SOLOSTAR) 100 UNIT/ML Solostar Pen Inject 24 units into the skin 10 pen 5  . Insulin Pen Needle 31G X 5 MM MISC Use pen needles for insulin injection daily 100 each 3  . insulin regular (NOVOLIN R,HUMULIN R) 100 units/mL injection Inject 0.1-0.16 mLs (10-16 Units total) into the skin 3 (three) times daily before meals. Relion. Please provide insulin syringes needed 20 mL 11  . Insulin Syringe-Needle U-100 (B-D INS SYRINGE 0.5CC/30GX1/2") 30G X 1/2" 0.5 ML MISC USE AS DIRECTED 4 TIMES A DAY 300 each 3  . letrozole (FEMARA) 2.5 MG tablet Take 1 tablet (2.5 mg total) by mouth daily. 90 tablet 1  . lisinopril-hydrochlorothiazide (PRINZIDE,ZESTORETIC) 10-12.5 MG tablet TAKE 1 TABLET BY MOUTH EVERY DAY 90 tablet 3  . lovastatin (MEVACOR) 40 MG tablet Take 1 tablet (40 mg total) by mouth at bedtime. 90 tablet 3  . metFORMIN (GLUCOPHAGE) 500 MG  tablet TAKE 2 TABLETS BY MOUTH DAILY WITH SUPPER. 180 tablet 0  . Na Sulfate-K Sulfate-Mg Sulf 17.5-3.13-1.6 GM/177ML SOLN Take 1 kit by mouth as directed. 354 mL 0  . OVER THE COUNTER MEDICATION Take 1 tablet by mouth daily as needed. Stool softner for constipation, also drinks warm prune juice prn     No current facility-administered medications for this visit.    Objective: BP 136/72 (BP Location: Left Arm, Cuff Size: Large)   Pulse 90   Temp 97.7 F (36.5 C) (Oral)   Ht 5' 2"  (1.575 m)   Wt 187 lb 6.4 oz (85 kg)   LMP  (LMP Unknown)   SpO2 97%   BMI 34.28 kg/m  Gen: NAD, resting comfortably PERRLA, attempted to view retina but pupils constricted too tight  for adequate view CV: RRR no murmurs rubs or gallops Lungs: CTAB no crackles, wheeze, rhonchi Abdomen: soft/nontender/nondistended/normal bowel sounds.  Ext: trace bilateral edema, no calf pain Skin: warm, dry  EKG: sinus rhythm with rate 82, normal axis, normal intervals, no hypertrophy, no st or t wave chages   Assessment/Plan:  Shortness of breath - Plan: EKG 12-Lead, ECHOCARDIOGRAM COMPLETE, Ambulatory referral to Cardiology S: Has started having trouble with shortness of breath when she exerts herself at all again. Had improved at last visit drastically after breast had been treated. Breast is still being treated but she continues to have issues. No more rectal bleeding like 07/16/18 visit and CBC ok at that time. Has some twinges of chest pain but mainly over the seroma/area of breast being treated. Can feel dizzy with it and has to sit down (when short of breath). Getting some pains in both arms. She states  SOB worsened after we saw each other last visit  Reassuring chest x-ray other than cardiomegaly on 07/16/18. Denies wheezing. No unilateral swelling- some swelling in both legs. BNP was not elevated   A/P: 77 year old female with multiple cardiac risk factors including HLD, HTN, DM presenting with dyspnea on exertion.  -EKG reassuring - get echocardiogram -refer to cardiology - doubt pulmonary related with no wheeze and no wheeze. Tobacco user (dip/chew) but not smoker - considered D dimer-hold off due to risk of false positive with seroma.  Wells criteria for PE score of either 0 or 1-if you consider ongoing Femara treatment as cancer treatment.  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 We did CBG to make sure not extremely elevated- was elevated into 200s but not excessively high  Working with  endocrinology to get blood sugar level down.  A/P: continue endocrine follow up- for floaters advised close optho follow up- she agrees to call to schedule  CKD (chronic kidney disease) stage 3, GFR 30-59 ml/min (HCC) Known CKD III- does not appear fluid overloaded. Reassuring/low BNP last visit in light of CKD III also makes BNP of <50 even less likely to reflect heart failure  Shortness of breath Doubt SOB anemia related as was stable in 11 range just 2 weeks ago and has not had further rectal bleeding    Future Appointments  Date Time Provider [place]  09/05/2018 10:15 AM Philemon Kingdom, MD [facility] None  09/12/2018 10:15 AM Marin Olp, MD [facility] PEC  09/19/2018 10:00 AM [facility] DIAG TOMO 1 [facility]BCG[facility] GI-BREAST CE  09/19/2018 11:00 AM [facility] DX DEXA 1 [facility]GD[facility]  GI-BREAST CE  01/10/2019  9:15 AM [facility] LAB 2 [facility] None  01/10/2019  9:45 AM [name], MD [facility] None   Lab/Order associations: 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  Garret Reddish, MD

## 2018-08-01 NOTE — Assessment & Plan Note (Signed)
Known CKD III- does not appear fluid overloaded. Reassuring/low BNP last visit in light of CKD III also makes BNP of <50 even less likely to reflect heart failure

## 2018-08-08 ENCOUNTER — Other Ambulatory Visit: Payer: Self-pay

## 2018-08-08 MED ORDER — LOVASTATIN 40 MG PO TABS: 40.0000 mg | ORAL_TABLET | Freq: Every day | ORAL | 3 refills | Status: DC

## 2018-08-09 DIAGNOSIS — N611 Abscess of the breast and nipple: Secondary | ICD-10-CM | POA: Diagnosis not present

## 2018-08-12 ENCOUNTER — Ambulatory Visit (HOSPITAL_COMMUNITY): Payer: Medicare Other | Attending: Cardiovascular Disease

## 2018-08-12 ENCOUNTER — Other Ambulatory Visit: Payer: Self-pay

## 2018-08-12 ENCOUNTER — Ambulatory Visit: Payer: Medicare Other | Admitting: Family Medicine

## 2018-08-12 DIAGNOSIS — R0602 Shortness of breath: Secondary | ICD-10-CM | POA: Diagnosis not present

## 2018-08-14 NOTE — Progress Notes (Signed)
Cardiology Office Note   Date:  08/15/2018   ID:  Wendy [name], Wendy [name] 09/19/1941, MRN [id number]  PCP:  Marin Olp, MD  Cardiologist:   Skeet Latch, MD   Chief Complaint  Patient presents with  . Shortness of Breath  . Hypertension      History of Present Illness: Wendy [name] is a 77 y.o. female with hypertension, diabetes, hyperlipidemia, CKD 3, and tobacco abuse who is being seen today for the evaluation of shortness of breath and hypertension at the request of Marin Olp, MD.  Wendy [name] saw Dr. Yong Channel 08/01/18 and reported exertional dyspnea.  She had a BNP that was within normal limits.  She also had an echo 08/2018 that revealed LVEF 55-60% and normal diastolic function.  There was mild mitral regurgitation.  She notes that she has been feeling short of breath for the last year.  She is short of breath with minimal exertion such as getting dressed, walking, cooking, and doing housework.  She finds it hard to catch her breath.  She has no chest pain.  Over the course of the year she thinks it is starting to get a little bit worse.  She does note that she does not exercise and wonders if it is due to this.  She has lower extremity edema that is worse in the right than the left.  Sometimes it improves with elevation.  She denies orthopnea or PND.  She does not add salt to her food but does eat out frequently.  Her daughter-in-law notes that they use a lot of salt in their cooking.  She has not tried wearing compression socks.  Wendy [name] continues to use snuff.  She has never smoked.  She is interested in trying to quit.  She has been doing this since age 59.   Past Medical History:  Diagnosis Date  . Abnormal breast finding 2018   per pt/ having  a lot drainage from left breast nipple  . Breast cancer ([place]) 11/2016   left/  . GERD (gastroesophageal reflux disease)    TUMS as needed  . HTN (hypertension)    states BP has been high recently; has been on med. x 20  yr.  . Hyperlipidemia   . Insulin dependent diabetes mellitus ([place])   . Personal history of chemotherapy    2018/finished 6 weeks of chemo in Sep 2018  . Personal history of radiation therapy    2018 left breast/finished radiation in Sept 2018 per pt.    Past Surgical History:  Procedure Laterality Date  . BREAST LUMPECTOMY Left 05/05/2008  . BREAST LUMPECTOMY Left 12/07/2016   malignant  . BREAST LUMPECTOMY WITH RADIOACTIVE SEED AND SENTINEL LYMPH NODE BIOPSY Left 12/07/2016   Procedure: LEFT BREAST LUMPECTOMY WITH RADIOACTIVE SEED AND SENTINEL LYMPH NODE BIOPSY;  Surgeon: Erroll Luna, MD;  Location: [place];  Service: General;  Laterality: Left;  . IR FLUORO GUIDE PORT INSERTION RIGHT  01/04/2017  . IR US GUIDE VASC ACCESS RIGHT  01/04/2017  . PORT-A-CATH REMOVAL N/A 05/30/2017   Procedure: REMOVAL PORT-A-CATH;  Surgeon: Erroll Luna, MD;  Location: [facility];  Service: General;  Laterality: N/A;  . RE-EXCISION OF BREAST LUMPECTOMY Left 12/28/2016   Procedure: RE-EXCISION OF BREAST LUMPECTOMY;  Surgeon: Erroll Luna, MD;  Location: [facility] [facility];  Service: General;  Laterality: Left;     Current Outpatient Medications  Medication Sig Dispense Refill  . amLODipine (NORVASC) 10 MG tablet  TAKE 1 TABLET (10 MG TOTAL) BY MOUTH DAILY. 90 tablet 1  . aspirin 81 MG tablet Take 81 mg by mouth daily.      . B-D INS SYRINGE 0.5CC/31GX5/16 31G X 5/16" 0.5 ML MISC USE AS DIRECTED 4 TIMES A DAY 300 each 1  . busPIRone (BUSPAR) 7.5 MG tablet TAKE 1 TABLET (7.5 MG TOTAL) BY MOUTH 3 (THREE) TIMES DAILY. 270 tablet 1  . carvedilol (COREG) 25 MG tablet Take 1 tablet (25 mg total) by mouth 2 (two) times daily with a meal. 180 tablet 2  . cloNIDine (CATAPRES) 0.3 MG tablet Take 1 tablet (0.3 mg total) by mouth 2 (two) times daily. 180 tablet 2  . doxycycline (ADOXA) 100 MG tablet Take 100 mg by mouth 2 (two) times daily.    . Insulin Glargine (LANTUS SOLOSTAR) 100 UNIT/ML  Solostar Pen Inject 24 units into the skin 10 pen 5  . Insulin Pen Needle 31G X 5 MM MISC Use pen needles for insulin injection daily 100 each 3  . insulin regular (NOVOLIN R,HUMULIN R) 100 units/mL injection Inject 0.1-0.16 mLs (10-16 Units total) into the skin 3 (three) times daily before meals. Relion. Please provide insulin syringes needed 20 mL 11  . Insulin Syringe-Needle U-100 (B-D INS SYRINGE 0.5CC/30GX1/2") 30G X 1/2" 0.5 ML MISC USE AS DIRECTED 4 TIMES A DAY 300 each 3  . letrozole (FEMARA) 2.5 MG tablet Take 1 tablet (2.5 mg total) by mouth daily. 90 tablet 1  . lisinopril-hydrochlorothiazide (PRINZIDE,ZESTORETIC) 10-12.5 MG tablet TAKE 1 TABLET BY MOUTH EVERY DAY 90 tablet 3  . lovastatin (MEVACOR) 40 MG tablet Take 1 tablet (40 mg total) by mouth at bedtime. 90 tablet 3  . metFORMIN (GLUCOPHAGE) 500 MG tablet TAKE 2 TABLETS BY MOUTH DAILY WITH SUPPER. 180 tablet 0  . Na Sulfate-K Sulfate-Mg Sulf 17.5-3.13-1.6 GM/177ML SOLN Take 1 kit by mouth as directed. 354 mL 0  . OVER THE COUNTER MEDICATION Take 1 tablet by mouth daily as needed. Stool softner for constipation, also drinks warm prune juice prn     No current facility-administered medications for this visit.    Facility-Administered Medications Ordered in Other Visits  Medication Dose Route Frequency Provider Last Rate Last Dose  . iopamidol (ISOVUE-370) 76 % injection           . sodium chloride (PF) 0.9 % injection             Allergies:   Patient has no known allergies.    Social History:  The patient  reports that she has quit smoking. Her smoking use included cigarettes. She quit smokeless tobacco use about 8 months ago.  Her smokeless tobacco use included snuff. She reports that she does not drink alcohol or use drugs.   Family History:  The patient's family history includes Arthritis in her sister; Asthma in her child; Cancer (age of onset: 52) in her father; Diabetes in her brother, child, child, child, paternal aunt,  paternal grandmother, and sister; Emphysema in her brother; Heart attack in her brother; Heart disease in her brother; Heart failure in her sister; Hypertension in her child.    ROS:  Please see the history of present illness.   Otherwise, review of systems are positive for none.   All other systems are reviewed and negative.    PHYSICAL EXAM: VS:  BP 121/64   Pulse 75   Resp 16   Ht 5' 2" (1.575 m)   Wt 187 lb (84.8 kg)  LMP  (LMP Unknown)   SpO2 99%   BMI 34.20 kg/m  , BMI Body mass index is 34.2 kg/m. GENERAL:  Well appearing HEENT:  Pupils equal round and reactive, fundi not visualized, oral mucosa unremarkable NECK:  No jugular venous distention, waveform within normal limits, carotid upstroke brisk and symmetric, no bruits, no thyromegaly LYMPHATICS:  No cervical adenopathy LUNGS:  Clear to auscultation bilaterally HEART:  RRR.  PMI not displaced or sustained,S1 and S2 within normal limits, no S3, no S4, no clicks, no rubs, no murmurs ABD:  Flat, positive bowel sounds normal in frequency in pitch, no bruits, no rebound, no guarding, no midline pulsatile mass, no hepatomegaly, no splenomegaly EXT:  2 plus pulses throughout, 1+ pitting edema to the ankle bilaterally R>L, no cyanosis no clubbing SKIN:  No rashes no nodules NEURO:  Cranial nerves II through XII grossly intact, motor grossly intact throughout PSYCH:  Cognitively intact, oriented to person place and time   EKG:  EKG is not ordered today. The ekg ordered 08/01/18 demonstrates sinus rhythm rate 82 bpm.    Echo 08/12/18: Study Conclusions  - Left ventricle: Moderate basal septal hypertrophy. The cavity   size was normal. Systolic function was normal. The estimated   ejection fraction was in the range of 55% to 60%. Wall motion was   normal; there were no regional wall motion abnormalities. Left   ventricular diastolic function parameters were normal. - Mitral valve: There was mild regurgitation. - Atrial  septum: There was increased thickness of the septum,   consistent with lipomatous hypertrophy. No defect or patent   foramen ovale was identified. - Pulmonary arteries: PA peak pressure: 35 mm Hg (S).   Recent Labs: 07/11/2018: ALT <6 07/16/2018: BUN 15; Creatinine, Ser 1.06; Potassium 4.6; Pro B Natriuretic peptide (BNP) 44.0; Sodium 135 07/19/2018: Hemoglobin 11.2; Platelets 439.0    Lipid Panel    Component Value Date/Time   CHOL 206 (H) 10/10/2017 1040   TRIG 113.0 10/10/2017 1040   HDL 65.20 10/10/2017 1040   CHOLHDL 3 10/10/2017 1040   VLDL 22.6 10/10/2017 1040   LDLCALC 119 (H) 10/10/2017 1040   LDLDIRECT 98.0 05/10/2018 1122      Wt Readings from Last 3 Encounters:  08/15/18 187 lb (84.8 kg)  08/01/18 187 lb 6.4 oz (85 kg)  07/19/18 189 lb 9.6 oz (86 kg)      ASSESSMENT AND PLAN:  # Shortness of breath: Echo showed normal systolic and diastolic function.  Her IVC was not dilated.  Though she does have lower extremity edema on exam, this is likely due to venous insufficiency, as her BNP was within normal limits.  It is possible that this is ischemia so we will get a Lexiscan Myoview to assess.  Given her mildly asymmetric edema there is also concern for pulmonary embolism.  She is not very physically active.  We will get a chest CT to evaluate for PE.  Finally, we will also check pulmonary function testing.  # Hypertension:  Blood pressure is well-controlled on carvedilol, clonidine, amlodipine, lisinopril and HCTZ.  # Hyperlipidemia:  LDL 98 on 05/2018.  Continue lovastatin.   # Tobacco abuse: Wendy [name] has been using snuff since 16.  We discussed using nicotine gum and she wants to give it a try.   Current medicines are reviewed at length with the patient today.  The patient does not have concerns regarding medicines.  The following changes have been made:  no change  Labs/ tests   ordered today include:   Orders Placed This Encounter  Procedures  . CT  ANGIO CHEST PE W OR WO CONTRAST  . MYOCARDIAL PERFUSION IMAGING  . Pulmonary function test     Disposition:   FU with Tiffany C. Caswell, MD, FACC in 1 month.      Signed, Tiffany C. Landfall, MD, FACC  08/15/2018 1:08 PM    [facility] 

## 2018-08-15 ENCOUNTER — Ambulatory Visit (INDEPENDENT_AMBULATORY_CARE_PROVIDER_SITE_OTHER): Payer: Medicare Other | Admitting: Cardiovascular Disease

## 2018-08-15 ENCOUNTER — Encounter: Payer: Self-pay | Admitting: Cardiovascular Disease

## 2018-08-15 ENCOUNTER — Ambulatory Visit (HOSPITAL_COMMUNITY)
Admission: RE | Admit: 2018-08-15 | Discharge: 2018-08-15 | Disposition: A | Discharge status: Home / Self Care | Payer: Medicare Other | Source: Ambulatory Visit | Attending: Cardiovascular Disease | Admitting: Cardiovascular Disease

## 2018-08-15 VITALS — BP 121/64 | HR 75 | Resp 16 | Ht 62.0 in | Wt 187.0 lb

## 2018-08-15 DIAGNOSIS — M47814 Spondylosis without myelopathy or radiculopathy, thoracic region: Secondary | ICD-10-CM | POA: Insufficient documentation

## 2018-08-15 DIAGNOSIS — R0602 Shortness of breath: Secondary | ICD-10-CM

## 2018-08-15 DIAGNOSIS — I272 Pulmonary hypertension, unspecified: Secondary | ICD-10-CM | POA: Insufficient documentation

## 2018-08-15 DIAGNOSIS — I10 Essential (primary) hypertension: Secondary | ICD-10-CM

## 2018-08-15 DIAGNOSIS — R590 Localized enlarged lymph nodes: Secondary | ICD-10-CM | POA: Diagnosis not present

## 2018-08-15 DIAGNOSIS — Z720 Tobacco use: Secondary | ICD-10-CM

## 2018-08-15 DIAGNOSIS — N281 Cyst of kidney, acquired: Secondary | ICD-10-CM | POA: Diagnosis not present

## 2018-08-15 DIAGNOSIS — E7800 Pure hypercholesterolemia, unspecified: Secondary | ICD-10-CM

## 2018-08-15 DIAGNOSIS — R0600 Dyspnea, unspecified: Secondary | ICD-10-CM | POA: Diagnosis not present

## 2018-08-15 MED ORDER — SODIUM CHLORIDE (PF) 0.9 % IJ SOLN
INTRAMUSCULAR | Status: AC
  Filled 2018-08-15: qty 50

## 2018-08-15 MED ORDER — IOPAMIDOL (ISOVUE-370) INJECTION 76%
100.0000 mL | Freq: Once | INTRAVENOUS | Status: AC | PRN
  Administered 2018-08-15: 77 mL via INTRAVENOUS

## 2018-08-15 MED ORDER — IOPAMIDOL (ISOVUE-370) INJECTION 76%
INTRAVENOUS | Status: AC
  Filled 2018-08-15: qty 100

## 2018-08-15 NOTE — Patient Instructions (Addendum)
Medication Instructions:  Your physician recommends that you continue on your current medications as directed. Please refer to the Current Medication list given to you today.  If you need a refill on your cardiac medications before your next appointment, please call your pharmacy.   Lab work: NONE  Testing/Procedures: Your physician has requested that you have a lexiscan myoview. For further information please visit [email or web address]. Please follow instruction sheet, as given.  Your physician has recommended that you have a pulmonary function test. Pulmonary Function Tests are a group of tests that measure how well air moves in and out of your lungs.  Non-Cardiac CT Angiography (CTA), is a special type of CT scan that uses a computer to produce multi-dimensional views of major blood vessels throughout the body. In CT angiography, a contrast material is injected through an IV to help visualize the blood vessels   Follow-Up: At [facility], you and your health needs are our priority.  As part of our continuing mission to provide you with exceptional heart care, we have created designated Provider Care Teams.  These Care Teams include your primary Cardiologist (physician) and Advanced Practice Providers (APPs -  Physician Assistants and Nurse Practitioners) who all work together to provide you with the care you need, when you need it. You will need a follow up appointment in 4 weeks.  Please call our office 2 months in advance to schedule this appointment.  You may see DR [facility] or one of the following Advanced Practice Providers on your designated Care Team:   Kerin Ransom, PA-C Roby Lofts, [place] . Sande Rives, PA-C  Any Other Special Instructions Will Be Listed Below (If Applicable). WEAR COMPRESSION SOCKS   DECREASE YOUR SALT INTAKE    Pulmonary Function Tests Pulmonary function tests (PFTs) are used to measure how well your lungs work, find out what is causing your lung  problems, and figure out the best treatment for you. You may have PFTs:  When you have an illness involving the lungs.  To follow changes in your lung function over time if you have a chronic lung disease.  If you are an Nature conservation officer. This checks the effects of being exposed to chemicals over a long period of time.  To check lung function before having surgery or other procedures.  To check your lungs if you smoke.  To check if prescribed medicines or treatments are helping your lungs.  Your results will be compared to the expected lung function of someone with healthy lungs who is similar to you in:  Age.  Gender.  Height.  Weight.  Race or ethnicity.  This is done to show how your lungs compare to normal lung function (percent predicted). This is how your health care provider knows if your lung function is normal or not. If you have had PFTs done before, your health care provider will compare your current results with past results. This shows if your lung function is better, worse, or the same as before. Tell a health care provider about:  Any allergies you have.  All medicines you are taking, including inhaler or nebulizer medicines, vitamins, herbs, eye drops, creams, and over-the-counter medicines.  Any blood disorders you have.  Any surgeries you have had, especially recent eye surgery, abdominal surgery, or chest surgery. These can make PFTs difficult or unsafe.  Any medical conditions you have, including chest pain or heart problems, tuberculosis, or respiratory infections such as pneumonia, a cold, or the flu.  Any fear of  being in closed spaces (claustrophobia). Some of your tests may be in a closed space. What are the risks? Generally, this is a safe procedure. However, problems may occur, including:  Light-headedness due to over-breathing (hyperventilation).  An asthma attack from deep breathing.  A collapsed lung.  What happens before the  procedure?  Take over-the-counter and prescription medicines only as told by your health care provider. If you take inhaler or nebulizer medicines, ask your health care provider which medicines you should take on the day of your testing. Some inhaler medicines may interfere with PFTs if they are taken shortly before the tests.  Follow your health care provider's instructions on eating and drinking restrictions. This may include avoiding eating large meals and drinking alcohol before the testing.  Do not use any products that contain nicotine or tobacco, such as cigarettes and e-cigarettes. If you need help quitting, ask your health care provider.  Wear comfortable clothing that will not interfere with breathing. What happens during the procedure?  You will be given a soft nose clip to wear. This is done so all of your breaths will go through your mouth instead of your nose.  You will be given a germ-free (sterile) mouthpiece. It will be attached to a machine that measures your breathing (spirometer).  You will be asked to do various breathing maneuvers. The maneuvers will be done by breathing in (inhaling) and breathing out (exhaling). You may be asked to repeat the maneuvers several times before the testing is done.  It is important to follow the instructions exactly to get accurate results. Make sure to blow as hard and as fast as you can when you are told to do so.  You may be given a medicine that makes the small air passages in your lungs larger (bronchodilator) after testing has been done. This medicine will make it easier for you to breathe.  The tests will be repeated after the bronchodilator has taken effect.  You will be monitored carefully during the procedure for faintness, dizziness, trouble breathing, or any other problems. The procedure may vary among health care providers and hospitals. What happens after the procedure?  It is up to you to get your test results. Ask your  health care provider, or the department that is doing the tests, when your results will be ready. After you have received your test results, talk with your health care provider about treatment options, if necessary. Summary  Pulmonary function tests (PFTs) are used to measure how well your lungs work, find out what is causing your lung problems, and figure out the best treatment for you.  Wear comfortable clothing that will not interfere with breathing.  It is up to you to get your test results. After you have received them, talk with your health care provider about treatment options, if necessary. This information is not intended to replace advice given to you by your health care provider. Make sure you discuss any questions you have with your health care provider. Document Released: 05/11/2004 Document Revised: 08/10/2016 Document Reviewed: 08/10/2016 Elsevier Interactive Patient Education  2018 [employer].  Cardiac Nuclear Scan A cardiac nuclear scan is a test that measures blood flow to the heart when a person is resting and when he or she is exercising. The test looks for problems such as:  Not enough blood reaching a portion of the heart.  The heart muscle not working normally.  You may need this test if:  You have heart disease.  You  have had abnormal lab results.  You have had heart surgery or angioplasty.  You have chest pain.  You have shortness of breath.  In this test, a radioactive dye (tracer) is injected into your bloodstream. After the tracer has traveled to your heart, an imaging device is used to measure how much of the tracer is absorbed by or distributed to various areas of your heart. This procedure is usually done at a hospital and takes 2-4 hours. Tell a health care provider about:  Any allergies you have.  All medicines you are taking, including vitamins, herbs, eye drops, creams, and over-the-counter medicines.  Any problems you or family members have  had with the use of anesthetic medicines.  Any blood disorders you have.  Any surgeries you have had.  Any medical conditions you have.  Whether you are pregnant or may be pregnant. What are the risks? Generally, this is a safe procedure. However, problems may occur, including:  Serious chest pain and heart attack. This is only a risk if the stress portion of the test is done.  Rapid heartbeat.  Sensation of warmth in your chest. This usually passes quickly.  What happens before the procedure?  Ask your health care provider about changing or stopping your regular medicines. This is especially important if you are taking diabetes medicines or blood thinners.  Remove your jewelry on the day of the procedure. What happens during the procedure?  An IV tube will be inserted into one of your veins.  Your health care provider will inject a small amount of radioactive tracer through the tube.  You will wait for 20-40 minutes while the tracer travels through your bloodstream.  Your heart activity will be monitored with an electrocardiogram (ECG).  You will lie down on an exam table.  Images of your heart will be taken for about 15-20 minutes.  You may be asked to exercise on a treadmill or stationary bike. While you exercise, your heart's activity will be monitored with an ECG, and your blood pressure will be checked. If you are unable to exercise, you may be given a medicine to increase blood flow to parts of your heart.  When blood flow to your heart has peaked, a tracer will again be injected through the IV tube.  After 20-40 minutes, you will get back on the exam table and have more images taken of your heart.  When the procedure is over, your IV tube will be removed. The procedure may vary among health care providers and hospitals. Depending on the type of tracer used, scans may need to be repeated 3-4 hours later. What happens after the procedure?  Unless your health care  provider tells you otherwise, you may return to your normal schedule, including diet, activities, and medicines.  Unless your health care provider tells you otherwise, you may increase your fluid intake. This will help flush the contrast dye from your body. Drink enough fluid to keep your urine clear or pale yellow.  It is up to you to get your test results. Ask your health care provider, or the department that is doing the test, when your results will be ready. Summary  A cardiac nuclear scan measures the blood flow to the heart when a person is resting and when he or she is exercising.  You may need this test if you are at risk for heart disease.  Tell your health care provider if you are pregnant.  Unless your health care provider tells you  otherwise, increase your fluid intake. This will help flush the contrast dye from your body. Drink enough fluid to keep your urine clear or pale yellow. This information is not intended to replace advice given to you by your health care provider. Make sure you discuss any questions you have with your health care provider. Document Released: 10/13/2004 Document Revised: 09/20/2016 Document Reviewed: 08/27/2013 Elsevier Interactive Patient Education  2017 [employer].   CT Angiogram A CT angiogram is a procedure to look at the blood vessels in various areas of the body. For this procedure, a large X-ray machine, called a CT scanner, takes detailed pictures of blood vessels that have been injected with a dye (contrast material). A CT angiogram allows your health care provider to see how well blood is flowing to the area of your body that is being checked. Your health care provider will be able to see if there are any problems, such as a blockage. Tell a health care provider about:  Any allergies you have.  All medicines you are taking, including vitamins, herbs, eye drops, creams, and over-the-counter medicines.  Any problems you or family members  have had with anesthetic medicines.  Any blood disorders you have.  Any surgeries you have had.  Any medical conditions you have.  Whether you are pregnant or may be pregnant.  Whether you are breastfeeding.  Any anxiety disorders, chronic pain, or other conditions you have that may increase your stress or prevent you from lying still. What are the risks? Generally, this is a safe procedure. However, problems may occur, including:  Infection.  Bleeding.  Allergic reactions to medicines or dyes.  Damage to other structures or organs.  Kidney damage from the dye or contrast that is used.  Increased risk of cancer from radiation exposure. This risk is low. Talk with your health care provider about: ? The risks and benefits of testing. ? How you can receive the lowest dose of radiation.  What happens before the procedure?  Wear comfortable clothing and remove any jewelry.  Follow instructions from your health care provider about eating and drinking. For most people, instructions may include these actions: ? For 12 hours before the test, avoid caffeine. This includes tea, coffee, soda, and energy drinks or pills. ? For 3-4 hours before the test, stop eating or drinking anything but water. ? Stay well hydrated by continuing to drink water before the exam. This will help to clear the contrast dye from your body after the test.  Ask your health care provider about changing or stopping your regular medicines. This is especially important if you are taking diabetes medicines or blood thinners. What happens during the procedure?  An IV tube will be inserted into one of your veins.  You will be asked to lie on an exam table. This table will slide in and out of the CT machine during the procedure.  Contrast dye will be injected into the IV tube. You might feel warm, or you may get a metallic taste in your mouth.  The table that you are lying on will move into the CT machine tunnel  for the scan.  The person running the machine will give you instructions while the scans are being done. You may be asked to: ? Keep your arms above your head. ? Hold your breath. ? Stay very still, even if the table is moving.  When the scanning is complete, you will be moved out of the machine.  The IV tube  will be removed. The procedure may vary among health care providers and hospitals. What happens after the procedure?  You might feel warm, or you may get a metallic taste in your mouth.  You may be asked to drink water or other fluids to wash (flush) the contrast material out of your body.  It is up to you to get the results of your procedure. Ask your health care provider, or the department that is doing the procedure, when your results will be ready. Summary  A CT angiogram is a procedure to look at the blood vessels in various areas of the body.  You will need to stay very still during the exam.  You may be asked to drink water or other fluids to wash (flush) the contrast material out of your body after your scan. This information is not intended to replace advice given to you by your health care provider. Make sure you discuss any questions you have with your health care provider. Document Released: 05/18/2016 Document Revised: 05/18/2016 Document Reviewed: 05/18/2016 Elsevier Interactive Patient Education  [employer].

## 2018-08-16 ENCOUNTER — Telehealth: Payer: Self-pay | Admitting: *Deleted

## 2018-08-16 DIAGNOSIS — R590 Localized enlarged lymph nodes: Secondary | ICD-10-CM

## 2018-08-16 NOTE — Telephone Encounter (Signed)
Left message for patient to call and schedule Leane Call and PFT's ordered by Dr. Oval Linsey

## 2018-08-16 NOTE — Telephone Encounter (Signed)
Advised patient, verbalized understanding  Message sent to scheduling to arrange.

## 2018-08-16 NOTE — Telephone Encounter (Signed)
-----   Message from Skeet Latch, MD sent at 08/15/2018  6:33 PM EST ----- Enlarged lymph node on the L.  I suspect this is related to her recent breast procedure.  Repeat a non-contrast chest CT in 2 months.

## 2018-08-19 ENCOUNTER — Encounter: Payer: Self-pay | Admitting: Cardiovascular Disease

## 2018-08-19 NOTE — Telephone Encounter (Signed)
Spoke with patient regarding PFT studies scheduled for 08/27/18 @ 11:00am.  Patient to arrive at 10:45am for registratoin.  I gave the patient instructions, but will also mail to her.

## 2018-08-23 ENCOUNTER — Ambulatory Visit: Payer: Self-pay | Admitting: Surgery

## 2018-08-23 ENCOUNTER — Telehealth: Payer: Self-pay

## 2018-08-23 DIAGNOSIS — N611 Abscess of the breast and nipple: Secondary | ICD-10-CM | POA: Diagnosis not present

## 2018-08-23 NOTE — Telephone Encounter (Signed)
   [facility] Pre-operative Risk Assessment    Request for surgical clearance:  1. What type of surgery is being performed? LEFT BREAST ABCESS  2. When is this surgery scheduled? TBD   3. What type of clearance is required (medical clearance vs. Pharmacy clearance to hold med vs. Both)?  MEDICAL  4. Are there any medications that need to be held prior to surgery and how long?NONE LISTED   5. Practice name and name of physician performing surgery? [facility] ATTN:MICHELLE   6. What is your office phone number [phone]    7.   What is your office fax number [phone]  8.   Anesthesia type (None, local, MAC, general) ?  GENERAL   Waylan Rocher 08/23/2018, 3:58 PM  _________________________________________________________________   (provider comments below)

## 2018-08-23 NOTE — H&P (Signed)
Wendy [name] Documented: 08/23/2018 10:52 AM Location: [facility] Patient #: [id number] DOB: Oct 05, 1940 Married / Language: English / Race: Black or African American Female  History of Present Illness Marcello Moores A. Vadim Centola MD; 08/23/2018 11:23 AM) Patient words: Patient returns for recheck of her left breast. She is a history of left breast abscess over a year out after left breast lumpectomy for stage I breast cancer. She's been managed with antibiotics and drainage in the office. Unfortunately, she continues to have redness and drainage this is now a chronic issue. She underwent a CT scan over at [facility] last week for shortness of breath. She was found to have some inflammatory changes and mild left axillary adenopathy which is more than likely secondary from this inflammatory process. Unfortunately, she has not healed despite conservative efforts at drainage and antibiotic therapy.  The patient is a 77 year old female.   Allergies ([name], [place]; 08/23/2018 10:53 AM) No Known Allergies [11/13/2016]: Allergies Reconciled  Medication History ([name], [place]; 08/23/2018 10:53 AM) Letrozole (2.5MG  Tablet, Oral) Active. Proventil HFA (108 (90 Base)MCG/ACT Aerosol Soln, Inhalation) Active. Lovastatin (40MG  Tablet, Oral) Active. HydroCHLOROthiazide (25MG  Tablet, Oral) Active. Rosuvastatin Calcium (5MG  Tablet, Oral) Active. CloNIDine HCl (0.3MG  Tablet, Oral) Active. Carvedilol (25MG  Tablet, Oral) Active. Lantus (100UNIT/ML Solution, Subcutaneous) Active. NovoLOG (100UNIT/ML Solution, Subcutaneous) Active. Lisinopril (40MG  Tablet, Oral) Active. MetFORMIN HCl (500MG  Tablet, Oral) Active. AmLODIPine Besylate (10MG  Tablet, Oral) Active. BD Insulin Syringe (30G X 1/2"0.5 ML Misc,) Active. BD Insulin Syr Ultrafine II (31G X 5/16"0.5 ML Misc,) Active. Medications Reconciled    Vitals ([name]; 08/23/2018 10:53 AM) 08/23/2018 10:53  AM Weight: 188.4 lb Height: 62in Body Surface Area: 1.86 m Body Mass Index: 34.46 kg/m  Temp.: 98.55F  Pulse: 74 (Regular)  BP: 126/88 (Sitting, Left Arm, Standard)      Physical Exam (Manu Rubey A. Ricka Westra MD; 08/23/2018 11:23 AM)  General Mental Status-Alert. General Appearance-Consistent with stated age. Hydration-Well hydrated. Voice-Normal.  Breast Note: Left breast shows a chronically draining wound. This is serous with mild induration in combination of postsurgical postradiation changes. Right breast is normal.  Neurologic Neurologic evaluation reveals -alert and oriented x 3 with no impairment of recent or remote memory. Mental Status-Normal.    Assessment & Plan (Samyrah Bruster A. [place] Monds MD; 08/23/2018 11:25 AM)  LEFT BREAST ABSCESS (N61.1) Impression: Risk of lumpectomy include bleeding, infection, seroma, more surgery, wound care, cosmetic deformity and the need for other treatments, death , blood clots, death. Pt agrees to proceed.    waxing and waning course Recommend debridement since this wound is not healing with conservative measures this point in time.    recommend debridement in the OR with drain placement at this point  Current Plans Pt Education - [employer] Free Text Education/Instructions: discussed with patient and provided information. You are being scheduled for surgery- Our schedulers will call you.  You should hear from our office's scheduling department within 5 working days about the location, date, and time of surgery. We try to make accommodations for patient's preferences in scheduling surgery, but sometimes the OR schedule or the surgeon's schedule prevents [name] from making those accommodations.  If you have not heard from our office [phone]) in 5 working days, call the office and ask for your surgeon's nurse.  If you have other questions about your diagnosis, plan, or surgery, call the office and ask for your  surgeon's nurse.  The anatomy and the physiology was discussed. The pathophysiology and natural  history of the disease was discussed. Options were discussed and recommendations were made. Technique, risks, benefits, & alternatives were discussed. Risks such as stroke, heart attack, bleeding, indection, death, and other risks discussed. Questions answered. The patient agrees to proceed.

## 2018-08-23 NOTE — H&P (Deleted)
Wendy [name] Documented: 08/23/2018 11:03 AM Location: [facility] Patient #: [id number] DOB: 03/10/1948 Married / Language: Wendy [name] / Race: White Female  History of Present Illness Wendy [name] A. Wendy [name] MD; 08/23/2018 11:20 AM) Patient words: Patient sent at the request of Dr. Brigitte [name] for umbilical hernia. The patient states he has had this for over 20 years. It is slowly gotten larger causing mild discomfort especially with coughing, lifting, pushing or pulling. He denies any associated nausea or vomiting. There's been no drainage or redness from the umbilicus. He is a chronic smoker and has no intent on quitting.  The patient is a 77 year old female.   Past Surgical History ([name], [place]; 08/23/2018 11:03 AM) Knee Surgery Right. Spinal Surgery - Lower Back  Diagnostic Studies History ([name], [place]; 08/23/2018 11:03 AM) Colonoscopy never  Allergies ([name], [place]; 08/23/2018 11:04 AM) No Known Drug Allergies [08/23/2018]: Allergies Reconciled  Medication History ([name], [place]; 08/23/2018 11:05 AM) amLODIPine Besylate (10MG  Tablet, Oral) Active. Bevespi Aerosphere (9-4.8MCG/ACT Aerosol, Inhalation) Active. Lisinopril-hydroCHLOROthiazide (10-12.5MG  Tablet, Oral) Active. Ventolin HFA (108 (90 Base)MCG/ACT Aerosol Soln, Inhalation) Active. Medications Reconciled  Social History ([name], [place]; 08/23/2018 11:03 AM) Alcohol use Moderate alcohol use. Illicit drug use Remotely quit drug use.  Other Problems ([name], [place]; 08/23/2018 11:03 AM) Asthma Kidney Stone Umbilical Hernia Repair     Review of Systems ([name]; 08/23/2018 11:03 AM) General Present- Night Sweats. Not Present- Appetite Loss, Chills, Fatigue, Fever, Weight Gain and Weight Loss. Skin Present- Ulcer. Not Present- Change in Wart/Mole, Dryness, Hives, Jaundice, New Lesions, Non-Healing Wounds and Rash. Respiratory Present-  Wheezing. Not Present- Bloody sputum, Chronic Cough, Difficulty Breathing and Snoring. Breast Not Present- Breast Mass, Breast Pain, Nipple Discharge and Skin Changes. Cardiovascular Present- Shortness of Breath. Not Present- Chest Pain, Difficulty Breathing Lying Down, Leg Cramps, Palpitations, Rapid Heart Rate and Swelling of Extremities. Female Genitourinary Present- Urgency. Not Present- Blood in Urine, Change in Urinary Stream, Frequency, Impotence, Nocturia, Painful Urination and Urine Leakage.  Vitals ([name]; 08/23/2018 11:04 AM) 08/23/2018 11:03 AM Weight: 202 lb Height: 69in Body Surface Area: 2.07 m Body Mass Index: 29.83 kg/m  Temp.: 98.62F  [name]: 95 (Regular)  BP: 142/86 (Sitting, Left Arm, Standard)      Physical Exam ([name] MD; 08/23/2018 11:21 AM)  General Mental Status-Alert. General Appearance-Consistent with stated age. Hydration-Well hydrated. Voice-Normal.  Head and Neck Head-normocephalic, atraumatic with no lesions or palpable masses. Trachea-midline. Thyroid Gland Characteristics - normal size and consistency.  Eye Eyeball - Bilateral-Extraocular movements intact. Sclera/Conjunctiva - Bilateral-No scleral icterus.  Cardiovascular Cardiovascular examination reveals -normal heart sounds, regular rate and rhythm with no murmurs and normal pedal pulses bilaterally.  Abdomen Note: Large reducible umbilical hernia. No rebound or guarding.  Neurologic Neurologic evaluation reveals -alert and oriented x 3 with no impairment of recent or remote memory. Mental Status-Normal.  Musculoskeletal Normal Exam - Left-Upper Extremity Strength Normal and Lower Extremity Strength Normal. Normal Exam - Right-Upper Extremity Strength Normal and Lower Extremity Strength Normal.    Assessment & Plan (Anthone Prieur A. Shayla Heming MD; 46/56/8127 51:70 AM)  UMBILICAL HERNIA WITHOUT OBSTRUCTION AND WITHOUT GANGRENE  (K42.9) Impression: Discussed repair with mesh. It is getting larger. Discussed increased operative risk given his continued tobacco abuse at high risk for complication of postoperative morbidity and potential mortality. He understands he involved. I discussed the use of mesh and chronic pain as well. The risk of  hernia repair include bleeding, infection, organ injury, bowel injury, bladder injury, nerve injury recurrent hernia, blood clots, worsening of underlying condition, chronic pain, mesh use, open surgery, death, and the need for other operattions. Pt agrees to proceed  Current Plans You are being scheduled for surgery- Our schedulers will call you.  You should hear from our office's scheduling department within 5 working days about the location, date, and time of surgery. We try to make accommodations for patient's preferences in scheduling surgery, but sometimes the OR schedule or the surgeon's schedule prevents [name] from making those accommodations.  If you have not heard from our office [phone]) in 5 working days, call the office and ask for your surgeon's nurse.  If you have other questions about your diagnosis, plan, or surgery, call the office and ask for your surgeon's nurse.  Pt Education - Pamphlet Given - Hernia Surgery: discussed with patient and provided information. Pt Education - CCS Mesh education: discussed with patient and provided information. The anatomy & physiology of the abdominal wall was discussed. The pathophysiology of hernias was discussed. Natural history risks without surgery including progeressive enlargement, pain, incarceration, & strangulation was discussed. Contributors to complications such as smoking, obesity, diabetes, prior surgery, etc were discussed.  I feel the risks of no intervention will lead to serious problems that outweigh the operative risks; therefore, I recommended surgery to reduce and repair the hernia. I explained an open  approach. I noted the probable use of mesh to patch and/or buttress the hernia repair  Risks such as bleeding, infection, abscess, need for further treatment, heart attack, death, and other risks were discussed. I noted a good likelihood this will help address the problem. Goals of post-operative recovery were discussed as well. Possibility that this will not correct all symptoms was explained. I stressed the importance of low-impact activity, aggressive pain control, avoiding constipation, & not pushing through pain to minimize risk of post-operative chronic pain or injury. Possibility of reherniation especially with smoking, obesity, diabetes, immunosuppression, and other health conditions was discussed. We will work to minimize complications.  An educational handout further explaining the pathology & treatment options was given as well. Questions were answered. The patient expresses understanding & wishes to proceed with surgery.  Pt Education - CCS STOP SMOKING!

## 2018-08-26 DIAGNOSIS — E113511 Type 2 diabetes mellitus with proliferative diabetic retinopathy with macular edema, right eye: Secondary | ICD-10-CM | POA: Diagnosis not present

## 2018-08-26 NOTE — Telephone Encounter (Signed)
Patient has a Nuclear stress test scheduled for 09/10/18 and a follow up visit with Dr. Oval Linsey 09/12/18. I will forward this noted to Dr. Oval Linsey so that she can address surgical risk at her visit on 09/12/18. Richardson Dopp, PA-C    08/26/2018 4:24 PM

## 2018-08-26 NOTE — Telephone Encounter (Signed)
Pending myoview on 09/10/2018 prior to clearance

## 2018-08-27 ENCOUNTER — Encounter (HOSPITAL_COMMUNITY): Payer: Medicare Other

## 2018-08-27 NOTE — Telephone Encounter (Signed)
I will take care of this when I see her.

## 2018-09-05 ENCOUNTER — Encounter: Payer: Self-pay | Admitting: Internal Medicine

## 2018-09-05 ENCOUNTER — Ambulatory Visit (INDEPENDENT_AMBULATORY_CARE_PROVIDER_SITE_OTHER): Payer: Medicare Other | Admitting: Internal Medicine

## 2018-09-05 ENCOUNTER — Telehealth (HOSPITAL_COMMUNITY): Payer: Self-pay

## 2018-09-05 ENCOUNTER — Telehealth: Payer: Self-pay

## 2018-09-05 VITALS — BP 120/70 | HR 75 | Ht 62.0 in | Wt 185.0 lb

## 2018-09-05 DIAGNOSIS — E785 Hyperlipidemia, unspecified: Secondary | ICD-10-CM

## 2018-09-05 DIAGNOSIS — IMO0002 Reserved for concepts with insufficient information to code with codable children: Secondary | ICD-10-CM

## 2018-09-05 DIAGNOSIS — E1165 Type 2 diabetes mellitus with hyperglycemia: Secondary | ICD-10-CM

## 2018-09-05 DIAGNOSIS — E113419 Type 2 diabetes mellitus with severe nonproliferative diabetic retinopathy with macular edema, unspecified eye: Secondary | ICD-10-CM

## 2018-09-05 DIAGNOSIS — Z794 Long term (current) use of insulin: Secondary | ICD-10-CM | POA: Diagnosis not present

## 2018-09-05 LAB — POCT GLYCOSYLATED HEMOGLOBIN (HGB A1C): HEMOGLOBIN A1C: 8.6 % — AB (ref 4.0–5.6)

## 2018-09-05 NOTE — Addendum Note (Signed)
Addended by: Cardell Peach I on: 09/05/2018 10:53 AM   Modules accepted: Orders

## 2018-09-05 NOTE — Patient Instructions (Addendum)
Please continue: - Metformin 1000 mg 2x a day with meals - Lantus 24 units in am - Novolog before a meal: -8 >> 10 units with a smaller meal -9 >> 12 units with a regular meal -10 >> 14  units with a large meal/if you eat out/if you have dessert  Please come back in 3 months with your sugar log

## 2018-09-05 NOTE — Telephone Encounter (Signed)
Encounter complete. 

## 2018-09-05 NOTE — Progress Notes (Signed)
Patient ID: Wendy [name], female   DOB: January 08, 1941, 77 y.o.   MRN: [id number]  HPI: Wendy [name] is a 77 y.o.-year-old female, returning for f/u for DM2, dx in 29s, insulin-dependent since 1990, uncontrolled, with complications (CKD, DR). Last visit 3.5 months ago.  She is here with her daughter who offers part of the history especially related to her insulin doses and blood sugars.  Last hemoglobin A1c was: Lab Results  Component Value Date   HGBA1C 9.6 (A) 05/30/2018   HGBA1C 9.3 (A) 02/27/2018   HGBA1C 9.2 11/29/2017    Pt is on: She called [name] after last OV (07/29/2018 with low CBGs) >> changed regimen:  Please continue: - Metformin 1000 mg 2x a day with meals  Restart: - Lantus 24 >> 18 >> but she increased back to 24 units at night >> in am  Decrease: - Novolog before a meal: -10 >> 8 units with a smaller meal -12 >> 9 units with a regular meal -14-16 >> 10 units with a large meal/if you eat out/if you have dessert  Please stay with the 8 to 9 units of NovoLog until the sugars stop dropping.  Lantus costs her 149$ per box >> she applied for financial assistance to get this free. She got this >> received the med.  Pt checks her sugars 4-6x a day: - am:  120-187 (snack) >> 177-265 >> 131-171 >> 144-261 - 2h after b'fast: n/c >> 116-270 >> n/c >> 220-344 >> 352 - before lunch:70 -160 >> 113, 148 >> n/c >> 280 - 2h after lunch: n/c >> 269 >> n/c >> 255-267 >> 236-335, 460 - before dinner:  200 >> 81, 170-275 >> n/c  - 2h after dinner:  120-174 >> 135 >> n/c >> 244, 298, 384  - bedtime:179-260 >> 304-441 (sweets) >> 180, 244-387 >> 275, 393, 394, 407 - nighttime:  n/c >> 73 x1, 130 >> n/c >> 312 Lowest sugar was 32 >> ... >> 70 >> 81 >> 120 >> 144; she has hypoglycemia awareness in the 70s. Highest sugar was 300s x 2 >> 441 >> 400s >> 400s.  Glucometer: CareSens Voice  Pt's meals are: - Breakfast: toast, egg, bacon/sausage; oatmeal - Lunch: [place] sandwich, soups,  crackers - Dinner: baked chicken, potatoes, cabbage (dinner is her largest meal) - Snacks: PB and crackers  -+ mild CKD, last BUN/creatinine:  Lab Results  Component Value Date   BUN 15 07/16/2018   CREATININE 1.06 07/16/2018  On  Lisinopril 10 -+ HL; last set of lipids: Lab Results  Component Value Date   CHOL 206 (H) 10/10/2017   HDL 65.20 10/10/2017   LDLCALC 119 (H) 10/10/2017   LDLDIRECT 98.0 05/10/2018   TRIG 113.0 10/10/2017   CHOLHDL 3 10/10/2017  On Lovastatin 40. 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  She has history of laser surgery. She usually sees Dr. Kathlen Mody.   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  She has breast cancer and had left breast lumpectomy + chemotherapy for 4 weeks + radiotherapy.    Her husband died 07-May-2018.  They have been married for 55 years.  She started Buspar to help with grief/insomnia/anxiety.  ROS: Constitutional: no weight gain/no weight loss, no fatigue, no subjective hyperthermia, no subjective hypothermia Eyes: no blurry vision, no xerophthalmia ENT: no sore throat, no nodules palpated in neck, no dysphagia, no odynophagia, no hoarseness Cardiovascular: no CP/no SOB/no palpitations/no leg swelling Respiratory: no cough/no SOB/no  wheezing Gastrointestinal: no N/no V/no D/no C/no acid reflux Musculoskeletal: no muscle aches/no joint aches Skin: no rashes, no hair loss Neurological: no tremors/no numbness/no tingling/no dizziness  I reviewed pt's medications, allergies, PMH, social hx, family hx, and changes were documented in the history of present illness. Otherwise, unchanged from my initial visit note.  Past Medical History:  Diagnosis Date  . Abnormal breast finding 2018   per pt/ having  a lot drainage from left breast nipple  . Breast cancer ([place]) 11/2016   left/  . GERD (gastroesophageal reflux disease)    TUMS as needed  . HTN (hypertension)    states BP has been high recently; has been on med. x  20 yr.  . Hyperlipidemia   . Insulin dependent diabetes mellitus ([place])   . Personal history of chemotherapy    2018/finished 6 weeks of chemo in Sep 2018  . Personal history of radiation therapy    2018 left breast/finished radiation in Sept 2018 per pt.   Past Surgical History:  Procedure Laterality Date  . BREAST LUMPECTOMY Left 05/05/2008  . BREAST LUMPECTOMY Left 12/07/2016   malignant  . BREAST LUMPECTOMY WITH RADIOACTIVE SEED AND SENTINEL LYMPH NODE BIOPSY Left 12/07/2016   Procedure: LEFT BREAST LUMPECTOMY WITH RADIOACTIVE SEED AND SENTINEL LYMPH NODE BIOPSY;  Surgeon: Erroll Luna, MD;  Location: [facility];  Service: General;  Laterality: Left;  . IR FLUORO GUIDE PORT INSERTION RIGHT  01/04/2017  . IR US GUIDE VASC ACCESS RIGHT  01/04/2017  . PORT-A-CATH REMOVAL N/A 05/30/2017   Procedure: REMOVAL PORT-A-CATH;  Surgeon: Erroll Luna, MD;  Location: [facility];  Service: General;  Laterality: N/A;  . RE-EXCISION OF BREAST LUMPECTOMY Left 12/28/2016   Procedure: RE-EXCISION OF BREAST LUMPECTOMY;  Surgeon: Erroll Luna, MD;  Location: [facility];  Service: General;  Laterality: Left;   Social History   Social History  . Marital Status: Married    Spouse Name: N/A  . Number of Children: 4   . retired    Social History Main Topics  . Smoking status: Never Smoker   . Smokeless tobacco: Former Systems developer    Types: Snuff    Quit date: 10/02/2014     Comment: Quit 10/02/2014  previous 1 box per week of snuff   . Alcohol Use: No  . Drug Use: No   Current Outpatient Medications on File Prior to Visit  Medication Sig Dispense Refill  . amLODipine (NORVASC) 10 MG tablet TAKE 1 TABLET (10 MG TOTAL) BY MOUTH DAILY. 90 tablet 1  . aspirin 81 MG tablet Take 81 mg by mouth daily.      . B-D INS SYRINGE 0.5CC/31GX5/16 31G X 5/16" 0.5 ML MISC USE AS DIRECTED 4 TIMES A DAY 300 each 1  . busPIRone (BUSPAR) 7.5 MG tablet TAKE 1 TABLET (7.5 MG TOTAL) BY MOUTH 3 (THREE)  TIMES DAILY. 270 tablet 1  . carvedilol (COREG) 25 MG tablet Take 1 tablet (25 mg total) by mouth 2 (two) times daily with a meal. 180 tablet 2  . cloNIDine (CATAPRES) 0.3 MG tablet Take 1 tablet (0.3 mg total) by mouth 2 (two) times daily. 180 tablet 2  . doxycycline (ADOXA) 100 MG tablet Take 100 mg by mouth 2 (two) times daily.    . Insulin Glargine (LANTUS SOLOSTAR) 100 UNIT/ML Solostar Pen Inject 24 units into the skin 10 pen 5  . Insulin Pen Needle 31G X 5 MM MISC Use pen needles for insulin injection daily  100 each 3  . insulin regular (NOVOLIN R,HUMULIN R) 100 units/mL injection Inject 0.1-0.16 mLs (10-16 Units total) into the skin 3 (three) times daily before meals. Relion. Please provide insulin syringes needed 20 mL 11  . Insulin Syringe-Needle U-100 (B-D INS SYRINGE 0.5CC/30GX1/2") 30G X 1/2" 0.5 ML MISC USE AS DIRECTED 4 TIMES A DAY 300 each 3  . letrozole (FEMARA) 2.5 MG tablet Take 1 tablet (2.5 mg total) by mouth daily. 90 tablet 1  . lisinopril-hydrochlorothiazide (PRINZIDE,ZESTORETIC) 10-12.5 MG tablet TAKE 1 TABLET BY MOUTH EVERY DAY 90 tablet 3  . lovastatin (MEVACOR) 40 MG tablet Take 1 tablet (40 mg total) by mouth at bedtime. 90 tablet 3  . metFORMIN (GLUCOPHAGE) 500 MG tablet TAKE 2 TABLETS BY MOUTH DAILY WITH SUPPER. 180 tablet 0  . Na Sulfate-K Sulfate-Mg Sulf 17.5-3.13-1.6 GM/177ML SOLN Take 1 kit by mouth as directed. 354 mL 0  . OVER THE COUNTER MEDICATION Take 1 tablet by mouth daily as needed. Stool softner for constipation, also drinks warm prune juice prn     No current facility-administered medications on file prior to visit.    No Known Allergies Family History  Problem Relation Age of Onset  . Diabetes Sister        x 4  . Diabetes Brother   . Heart disease Brother   . Diabetes Paternal Grandmother   . Diabetes Paternal Aunt   . Heart attack Brother   . Heart failure Sister   . Arthritis Sister   . Emphysema Brother   . Cancer Father 29       brain  tumor   . Diabetes Child   . Asthma Child   . Hypertension Child   . Diabetes Child   . Diabetes Child   . Colon cancer Neg Hx   . Stomach cancer Neg Hx    PE: BP 120/70   Pulse 75   Ht 5' 2"  (1.575 m) Comment: measured  Wt 185 lb (83.9 kg)   LMP  (LMP Unknown)   SpO2 97%   BMI 33.84 kg/m  Body mass index is 33.84 kg/m. Wt Readings from Last 3 Encounters:  09/05/18 185 lb (83.9 kg)  08/15/18 187 lb (84.8 kg)  08/01/18 187 lb 6.4 oz (85 kg)   Constitutional: overweight, in NAD Eyes: PERRLA, EOMI, no exophthalmos ENT: moist mucous membranes, no thyromegaly, no cervical lymphadenopathy Cardiovascular: RRR, No MRG Respiratory: CTA B Gastrointestinal: abdomen soft, NT, ND, BS+ Musculoskeletal: no deformities, strength intact in all 4 Skin: moist, warm, no rashes Neurological: no tremor with outstretched hands, DTR normal in all 4   ASSESSMENT: 1. 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 HL  3. Obesity  PLAN:  1.  Patient with longstanding, uncontrolled, type 2 diabetes, on basal-bolus insulin regimen and metformin.  She has history of noncompliance with the recommended regimen due to finances.  We did discuss in the past about switching to a more affordable regimen of NPH and regular insulin from [facility] but she applied for financial assistance for Lantus and metformin.  At last visit, she was waiting for a response and we gave her samples of these insulins pending the decision from her insurance. -At last visit, we increased her NovoLog as her sugars were increasing as the day went by, however, since last visit, approximately 1.5 months ago, she called with low blood sugars and we reduced her insulin doses -Reviewing her log, sugars are very high  at all times of the day, in the 300s and 400s.  We could not download her meter so I could only review her last 3 days of sugars.  The Lantus dose appears accurate  as her sugars decrease from bedtime to a.m., fasting.  However, the increase significantly throughout the day.  I will advised her to increase her NovoLog dose with all of her meals. - I suggested to:  Patient Instructions  Please continue: - Metformin 1000 mg 2x a day with meals - Lantus 24 units in am - Novolog before a meal: -8 >> 10 units with a smaller meal -9 >> 12 units with a regular meal -10 >> 14  units with a large meal/if you eat out/if you have dessert  Please come back in 3 months with your sugar log  - today, HbA1c is 8.6% (better) - continue checking sugars at different times of the day - check 3x a day, rotating checks - advised for yearly eye exams >> she is not UTD - Return to clinic in 3 mo with sugar log    2. HL - Reviewed latest lipid panel from 01 and 05/2018: LDL improved at last check Lab Results  Component Value Date   CHOL 206 (H) 10/10/2017   HDL 65.20 10/10/2017   LDLCALC 119 (H) 10/10/2017   LDLDIRECT 98.0 05/10/2018   TRIG 113.0 10/10/2017   CHOLHDL 3 10/10/2017  - Continues Lovastatin 40. without side effects.  3. Obesity - weight stable  - unfortunately, need to increase Novolog dose  - may experience some wt gain  Philemon Kingdom, MD PhD [facility]

## 2018-09-05 NOTE — Telephone Encounter (Signed)
Received 4 vials of Lantus insulin with patients name on it from Pt assistance. Called pt and left message to inform her to call office so she can be informed that her insulin can be picked up anytime.

## 2018-09-06 ENCOUNTER — Telehealth (HOSPITAL_COMMUNITY): Payer: Self-pay

## 2018-09-06 NOTE — Telephone Encounter (Signed)
Encounter complete. 

## 2018-09-10 ENCOUNTER — Encounter (HOSPITAL_COMMUNITY): Payer: Medicare Other

## 2018-09-10 ENCOUNTER — Ambulatory Visit (HOSPITAL_COMMUNITY)
Admission: RE | Admit: 2018-09-10 | Payer: Medicare Other | Source: Ambulatory Visit | Attending: Cardiovascular Disease | Admitting: Cardiovascular Disease

## 2018-09-12 ENCOUNTER — Other Ambulatory Visit: Payer: Self-pay | Admitting: Internal Medicine

## 2018-09-12 ENCOUNTER — Ambulatory Visit (INDEPENDENT_AMBULATORY_CARE_PROVIDER_SITE_OTHER): Payer: Medicare Other | Admitting: Family Medicine

## 2018-09-12 ENCOUNTER — Encounter: Payer: Self-pay | Admitting: Family Medicine

## 2018-09-12 ENCOUNTER — Ambulatory Visit: Payer: Medicare Other | Admitting: Cardiovascular Disease

## 2018-09-12 VITALS — BP 148/68 | HR 71 | Temp 98.0°F | Ht 62.0 in | Wt 187.0 lb

## 2018-09-12 DIAGNOSIS — E785 Hyperlipidemia, unspecified: Secondary | ICD-10-CM | POA: Diagnosis not present

## 2018-09-12 DIAGNOSIS — N183 Chronic kidney disease, stage 3 unspecified: Secondary | ICD-10-CM

## 2018-09-12 DIAGNOSIS — I10 Essential (primary) hypertension: Secondary | ICD-10-CM

## 2018-09-12 LAB — COMPREHENSIVE METABOLIC PANEL
ALBUMIN: 4 g/dL (ref 3.5–5.2)
ALT: 5 U/L (ref 0–35)
AST: 9 U/L (ref 0–37)
Alkaline Phosphatase: 96 U/L (ref 39–117)
BILIRUBIN TOTAL: 0.4 mg/dL (ref 0.2–1.2)
BUN: 19 mg/dL (ref 6–23)
CO2: 31 meq/L (ref 19–32)
Calcium: 9.5 mg/dL (ref 8.4–10.5)
Chloride: 101 mEq/L (ref 96–112)
Creatinine, Ser: 1.11 mg/dL (ref 0.40–1.20)
GFR: 61.26 mL/min (ref >60.00)
Glucose, Bld: 204 mg/dL — ABNORMAL HIGH (ref 70–99)
POTASSIUM: 4.3 meq/L (ref 3.5–5.1)
SODIUM: 138 meq/L (ref 135–145)
TOTAL PROTEIN: 7 g/dL (ref 6.0–8.3)

## 2018-09-12 LAB — LDL CHOLESTEROL, DIRECT: LDL DIRECT: 140 mg/dL

## 2018-09-12 LAB — CBC
HEMATOCRIT: 34.6 % — AB (ref 36.0–46.0)
Hemoglobin: 11.6 g/dL — ABNORMAL LOW (ref 12.0–15.0)
MCHC: 33.5 g/dL (ref 30.0–36.0)
MCV: 83.5 fl (ref 78.0–100.0)
PLATELETS: 277 10*3/uL (ref 150.0–400.0)
RBC: 4.14 Mil/uL (ref 3.87–5.11)
RDW: 16.4 % — ABNORMAL HIGH (ref 11.5–15.5)
WBC: 6.1 10*3/uL (ref 4.0–10.5)

## 2018-09-12 NOTE — Assessment & Plan Note (Signed)
S: Poorly controlled on lovastatin 40 mg-best we have been able to do given myalgias on stronger statin such as Crestor in the past and other statin trials including even pravastatin  Lab Results  Component Value Date   CHOL 206 (H) 10/10/2017   HDL 65.20 10/10/2017   LDLCALC 119 (H) 10/10/2017   LDLDIRECT 98.0 05/10/2018   TRIG 113.0 10/10/2017   CHOLHDL 3 10/10/2017   A/P: Mild poor control- continue current medication-we will at least try to keep LDL under 130.  Update direct LDL today

## 2018-09-12 NOTE — Patient Instructions (Addendum)
We discussed blood pressure goal of <140/90. Continue current meds: we will plan on 1 month blood pressure recheck and if remains elevated may need to adjust medications.   We still need to get colonoscopy when things settle down  Still need to get stress test done- discuss with Dr. Oval Linsey and you will also need that before potential surgery for your breast.   Please stop by lab before you go

## 2018-09-12 NOTE — Progress Notes (Signed)
Subjective:  Wendy [name] is a 77 y.o. year old very pleasant female patient who presents for/with See problem oriented charting ROS- no chest pain other than around prior breast cancer/seroma site. Some shortnss of breath with activity. Mild edema in ankles. No headaches   Past Medical History-  Patient Active Problem List   Diagnosis Date Noted  . Shortness of breath 03/14/2017    Priority: High  . Malignant neoplasm of upper-outer quadrant of left breast in female, estrogen receptor positive ([place]) 11/20/2016    Priority: High  . 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 Chronic diastolic heart failure ([place]) 07/06/2011    Priority: High  . Rectal bleeding 01/17/2017    Priority: Medium  . CKD (chronic kidney disease) stage 3, GFR 30-59 ml/min (HCC) 03/17/2016    Priority: Medium  . Osteopenia 01/05/2016    Priority: Medium  . TOBACCO USE 04/13/2008    Priority: Medium  . Hyperlipidemia 11/29/2006    Priority: Medium  . OBESITY, BMI 30-35 11/29/2006    Priority: Medium  . HYPERTENSION, BENIGN SYSTEMIC 11/29/2006    Priority: Medium  . Fatigue 03/14/2017    Priority: Low  . Edema 03/14/2017    Priority: Low  . Port catheter in place 01/18/2017    Priority: Low  . Constipation 01/17/2017    Priority: Low  . Seasonal allergies 12/01/2013    Priority: Low  . Grief 05/10/2018  . Anemia 01/09/2018    Medications- reviewed and updated Current Outpatient Medications  Medication Sig Dispense Refill  . amLODipine (NORVASC) 10 MG tablet TAKE 1 TABLET (10 MG TOTAL) BY MOUTH DAILY. 90 tablet 1  . aspirin 81 MG tablet Take 81 mg by mouth daily.      . B-D INS SYRINGE 0.5CC/31GX5/16 31G X 5/16" 0.5 ML MISC USE AS DIRECTED 4 TIMES A DAY 300 each 1  . busPIRone (BUSPAR) 7.5 MG tablet TAKE 1 TABLET (7.5 MG TOTAL) BY MOUTH 3 (THREE) TIMES DAILY. 270 tablet  1  . carvedilol (COREG) 25 MG tablet Take 1 tablet (25 mg total) by mouth 2 (two) times daily with a meal. 180 tablet 2  . cloNIDine (CATAPRES) 0.3 MG tablet Take 1 tablet (0.3 mg total) by mouth 2 (two) times daily. 180 tablet 2  . doxycycline (ADOXA) 100 MG tablet Take 100 mg by mouth 2 (two) times daily.    . Insulin Glargine (LANTUS SOLOSTAR) 100 UNIT/ML Solostar Pen Inject 24 units into the skin 10 pen 5  . Insulin Pen Needle 31G X 5 MM MISC Use pen needles for insulin injection daily 100 each 3  . insulin regular (NOVOLIN R,HUMULIN R) 100 units/mL injection Inject 0.1-0.16 mLs (10-16 Units total) into the skin 3 (three) times daily before meals. Relion. Please provide insulin syringes needed 20 mL 11  . Insulin Syringe-Needle U-100 (B-D INS SYRINGE 0.5CC/30GX1/2") 30G X 1/2" 0.5 ML MISC USE AS DIRECTED 4 TIMES A DAY 300 each 3  . letrozole (FEMARA) 2.5 MG tablet Take 1 tablet (2.5 mg total) by mouth daily. 90 tablet 1  . lisinopril-hydrochlorothiazide (PRINZIDE,ZESTORETIC) 10-12.5 MG tablet TAKE 1 TABLET BY MOUTH EVERY DAY 90 tablet 3  . lovastatin (MEVACOR) 40 MG tablet Take 1 tablet (40 mg total) by mouth at bedtime. 90 tablet 3  . metFORMIN (GLUCOPHAGE) 500 MG tablet TAKE 2 TABLETS BY MOUTH DAILY WITH SUPPER. 180 tablet 0  .  Na Sulfate-K Sulfate-Mg Sulf 17.5-3.13-1.6 GM/177ML SOLN Take 1 kit by mouth as directed. 354 mL 0  . OVER THE COUNTER MEDICATION Take 1 tablet by mouth daily as needed. Stool softner for constipation, also drinks warm prune juice prn     No current facility-administered medications for this visit.     Objective: BP (!) 148/68 (BP Location: Left Arm, Patient Position: Sitting, Cuff Size: Large)   Pulse 71   Temp 98 F (36.7 C) (Oral)   Ht _0  (1.575 m)   Wt 187 lb (84.8 kg)   LMP  (LMP Unknown)   SpO2 97%   BMI 34.20 kg/m  Gen: NAD, resting comfortably, tearful during visit over stressors CV: RRR no murmurs rubs or gallops Lungs: CTAB no crackles,  wheeze, rhonchi Abdomen: soft/nontender/nondistended/normal bowel sounds.  Ext: no edema Skin: warm, dry Neuro: speech normal, moves all extremities  Assessment/Plan:  Other notes: 1.She has remained snuff free 2. Lost her husband Jeneen Rinks mid 2019-sparing buspirone helpful for anxiety 3. Surgery stating needing seroma surgery unfortunately with Dr. Brantley Stage 4. For SOB- had CT scan of chest that was reassuring other than a lymph node enlarged and 2 month repeat planned with Dr. Oval Linsey. She still has to get her stress test done- states still with intermittent shortness of breath with cleaning house or rushing around 5. Anemia has been stable even about 6 weeks ago despite prior rectal bleeding- she declines colonoscopy even though potential risk of colon cancer- will update CBC with labs to make sure stable. Last colonoscopy 2005 but I dont have records so needs 1 more even in abscence of rectal bleeding  CKD (chronic kidney disease) stage 3, GFR 30-59 ml/min (HCC) S: Patient with known chronic kidney disease- knows to avoid NSAIDs.  Need to control blood pressure A/P: Hopefully stable-update GFR today  Hyperlipidemia S: Poorly controlled on lovastatin 40 mg-best we have been able to do given myalgias on stronger statin such as Crestor in the past and other statin trials including even pravastatin  Lab Results  Component Value Date   CHOL 206 (H) 10/10/2017   HDL 65.20 10/10/2017   LDLCALC 119 (H) 10/10/2017   LDLDIRECT 98.0 05/10/2018   TRIG 113.0 10/10/2017   CHOLHDL 3 10/10/2017   A/P: Mild poor control- continue current medication-we will at least try to keep LDL under 130.  Update direct LDL today   HYPERTENSION, BENIGN SYSTEMIC S: controlled in past on lisinopril hydrochlorothiazide 10-12.5 mg, amlodipine 10 mg, Coreg 25 mg twice daily, clonidine 0.3 mg twice daily.   She admits to very high stress- just found out about several tax bills that husband didn't pay.  BP Readings  from Last 3 Encounters:  09/12/18 (!) 148/68  09/05/18 120/70  08/15/18 121/64  A/P: We discussed blood pressure goal of <140/90. Continue current meds: we will plan on 1 month blood pressure recheck and if remains elevated may need to adjust medications.     Future Appointments  Date Time Provider [place]  09/19/2018 10:00 AM [facility] DIAG TOMO 1 [facility]-BCGMM GI-BREAST CE  09/19/2018 11:00 AM [facility] DX DEXA 1 GI-BCGDG GI-BREAST CE  09/26/2018  8:30 AM Almyra Deforest, PA [facility]  09/26/2018  9:30 AM MC-CV NL NUC MED MC-SECVI [facility]NL  12/24/2018 10:30 AM Philemon Kingdom, MD [facility]-LBENDO None  01/10/2019  9:15 AM [facility] LAB 2 [facility]-MED[facility] None  01/10/2019  9:45 AM [name], MD [facility]-MED[facility] None   No follow-ups on file.  Lab/Order associations: CKD (chronic kidney disease)  stage 3, GFR 30-59 ml/min (HCC) - Plan: CBC, Comprehensive metabolic panel, LDL cholesterol, direct  Hyperlipidemia, unspecified hyperlipidemia type - Plan: CBC, Comprehensive metabolic panel, LDL cholesterol, direct  HYPERTENSION, BENIGN SYSTEMIC - Plan: CBC, Comprehensive metabolic panel, LDL cholesterol, direct  No orders of the defined types were placed in this encounter.   Return precautions advised.  Garret Reddish, MD

## 2018-09-12 NOTE — Assessment & Plan Note (Signed)
S: controlled in past on lisinopril hydrochlorothiazide 10-12.5 mg, amlodipine 10 mg, Coreg 25 mg twice daily, clonidine 0.3 mg twice daily.   She admits to very high stress- just found out about several tax bills that husband didn't pay.  BP Readings from Last 3 Encounters:  09/12/18 (!) 148/68  09/05/18 120/70  08/15/18 121/64  A/P: We discussed blood pressure goal of <140/90. Continue current meds: we will plan on 1 month blood pressure recheck and if remains elevated may need to adjust medications.

## 2018-09-12 NOTE — Assessment & Plan Note (Signed)
S: Patient with known chronic kidney disease- knows to avoid NSAIDs.  Need to control blood pressure A/P: Hopefully stable-update GFR today

## 2018-09-19 ENCOUNTER — Ambulatory Visit
Admission: RE | Admit: 2018-09-19 | Discharge: 2018-09-19 | Disposition: A | Discharge status: Home / Self Care | Payer: Medicare Other | Source: Ambulatory Visit | Attending: Hematology | Admitting: Hematology

## 2018-09-19 ENCOUNTER — Telehealth (HOSPITAL_COMMUNITY): Payer: Self-pay

## 2018-09-19 ENCOUNTER — Other Ambulatory Visit: Payer: Medicare Other

## 2018-09-19 DIAGNOSIS — E2839 Other primary ovarian failure: Secondary | ICD-10-CM

## 2018-09-19 DIAGNOSIS — Z170 Estrogen receptor positive status [ER+]: Principal | ICD-10-CM

## 2018-09-19 DIAGNOSIS — M85852 Other specified disorders of bone density and structure, left thigh: Secondary | ICD-10-CM | POA: Diagnosis not present

## 2018-09-19 DIAGNOSIS — C50412 Malignant neoplasm of upper-outer quadrant of left female breast: Secondary | ICD-10-CM

## 2018-09-19 DIAGNOSIS — Z780 Asymptomatic menopausal state: Secondary | ICD-10-CM | POA: Diagnosis not present

## 2018-09-19 NOTE — Telephone Encounter (Signed)
Encounter complete. 

## 2018-09-21 ENCOUNTER — Other Ambulatory Visit: Payer: Self-pay | Admitting: Family Medicine

## 2018-09-23 ENCOUNTER — Telehealth: Payer: Self-pay

## 2018-09-23 NOTE — Telephone Encounter (Signed)
-----   Message from [name], MD sent at 09/22/2018  2:32 PM EST ----- Please let pt know the lab results, she has osteopenia, no high risk feature for fracture, please encourage her to take calcium and vitD, thanks   [name]  09/22/2018

## 2018-09-23 NOTE — Telephone Encounter (Signed)
Spoke with patient per Dr. Burr Medico, bone density scan results show that she has osteopenia, no high risk for fracture, encouraged her to take calcium and Vitamin D supplement daily.

## 2018-09-26 ENCOUNTER — Ambulatory Visit (HOSPITAL_COMMUNITY)
Admission: RE | Admit: 2018-09-26 | Discharge: 2018-09-26 | Disposition: A | Discharge status: Home / Self Care | Payer: Medicare Other | Source: Ambulatory Visit | Attending: Cardiovascular Disease | Admitting: Cardiovascular Disease

## 2018-09-26 ENCOUNTER — Encounter: Payer: Medicare Other | Admitting: Physician Assistant

## 2018-09-26 DIAGNOSIS — R0602 Shortness of breath: Secondary | ICD-10-CM

## 2018-09-26 LAB — MYOCARDIAL PERFUSION IMAGING
CHL CUP RESTING HR STRESS: 62 {beats}/min
CSEPPHR: 83 {beats}/min
LV dias vol: 85 mL (ref 46–106)
LV sys vol: 33 mL
SDS: 1
SRS: 0
SSS: 1
TID: 1.65

## 2018-09-26 MED ORDER — REGADENOSON 0.4 MG/5ML IV SOLN
0.4000 mg | Freq: Once | INTRAVENOUS | Status: AC
  Administered 2018-09-26: 0.4 mg via INTRAVENOUS

## 2018-09-26 MED ORDER — TECHNETIUM TC 99M TETROFOSMIN IV KIT
30.2000 | PACK | Freq: Once | INTRAVENOUS | Status: AC | PRN
  Administered 2018-09-26: 30.2 via INTRAVENOUS
  Filled 2018-09-26: qty 31

## 2018-09-26 MED ORDER — TECHNETIUM TC 99M TETROFOSMIN IV KIT
10.1000 | PACK | Freq: Once | INTRAVENOUS | Status: AC | PRN
  Administered 2018-09-26: 10.1 via INTRAVENOUS
  Filled 2018-09-26: qty 11

## 2018-09-26 NOTE — Patient Instructions (Signed)
Medication Instructions:  The current medical regimen is effective;  continue present plan and medications.  If you need a refill on your cardiac medications before your next appointment, please call your pharmacy.   Testing/Procedures: Your physician has recommended that you have a pulmonary function test. Pulmonary Function Tests are a group of tests that measure how well air moves in and out of your lungs.  Complete stress test scheduled for today   Follow-Up: At [facility], you and your health needs are our priority.  As part of our continuing mission to provide you with exceptional heart care, we have created designated Provider Care Teams.  These Care Teams include your primary Cardiologist (physician) and Advanced Practice Providers (APPs -  Physician Assistants and Nurse Practitioners) who all work together to provide you with the care you need, when you need it. Marland Kitchen Keep Follow up with Almyra Deforest, PA

## 2018-09-26 NOTE — Progress Notes (Signed)
This encounter was created in error - please disregard.

## 2018-09-30 ENCOUNTER — Other Ambulatory Visit: Payer: Self-pay | Admitting: Hematology

## 2018-09-30 DIAGNOSIS — C50412 Malignant neoplasm of upper-outer quadrant of left female breast: Secondary | ICD-10-CM

## 2018-09-30 DIAGNOSIS — Z170 Estrogen receptor positive status [ER+]: Principal | ICD-10-CM

## 2018-10-07 ENCOUNTER — Inpatient Hospital Stay (HOSPITAL_COMMUNITY): Admission: RE | Admit: 2018-10-07 | Payer: Medicare Other | Source: Ambulatory Visit

## 2018-10-11 ENCOUNTER — Ambulatory Visit (INDEPENDENT_AMBULATORY_CARE_PROVIDER_SITE_OTHER): Payer: Medicare Other | Admitting: Medical

## 2018-10-11 ENCOUNTER — Encounter: Payer: Self-pay | Admitting: Physician Assistant

## 2018-10-11 VITALS — BP 112/56 | HR 74 | Ht 62.0 in | Wt 187.4 lb

## 2018-10-11 DIAGNOSIS — C50912 Malignant neoplasm of unspecified site of left female breast: Secondary | ICD-10-CM

## 2018-10-11 DIAGNOSIS — E7800 Pure hypercholesterolemia, unspecified: Secondary | ICD-10-CM | POA: Diagnosis not present

## 2018-10-11 DIAGNOSIS — I10 Essential (primary) hypertension: Secondary | ICD-10-CM

## 2018-10-11 DIAGNOSIS — Z794 Long term (current) use of insulin: Secondary | ICD-10-CM

## 2018-10-11 DIAGNOSIS — R0602 Shortness of breath: Secondary | ICD-10-CM | POA: Diagnosis not present

## 2018-10-11 DIAGNOSIS — Z720 Tobacco use: Secondary | ICD-10-CM | POA: Diagnosis not present

## 2018-10-11 DIAGNOSIS — E119 Type 2 diabetes mellitus without complications: Secondary | ICD-10-CM | POA: Diagnosis not present

## 2018-10-11 NOTE — Progress Notes (Signed)
Cardiology Office Note   Date:  10/11/2018   ID:  Wendy, [name] [birth year], MRN [id number]  PCP:  Marin Olp, MD  Cardiologist:  No primary care provider on file. EP: None  Chief Complaint  Patient presents with  . Follow-up    SOB      History of Present Illness: Wendy [name] is a 78 y.o. female with a PMH of HTN, HLD, DM type 2, CKD stage 3, GERD, and tobacco abuse, who presents for follow-up of SOB.   She was last seen by cardiology outpatient on 08/2018 with Dr. Oval Linsey for the evaluation of SOB and HTN. She reported progressive SOB with minimal exertion but had no chest pain, orthopnea, PND. She reported chronic LE edema and high salt intake. Echo 08/2018 showed normal LV systolic and diastolic function and no significant valvular abnormalities. She was recommended to undergo a NST to evaluate for SOB as an anginal equivalent which occurred 09/2018 and revealed no evidence of ischemia. She also underwent a CT Chest which did not show PE but did show some concern for possible abscess or necrotic mass. She was also recommended for PFTs which have not occurred yet.  No medication changes occurred at that visit.   She returns today for follow-up of her SOB. She states her breathing has actually improved since her last cardiology appointment. She attributes this to improvement in her left breast abscess which she states is no longer actively draining. She reports improvement in her LE edema since she started wearing compression stockings. She is without complaints of chest pain, SOB at rest, orthopnea, PND, dizziness, lightheadedness, or syncope. She continues to use chewing tobacco and states she knows she needs to quit and will pray about it.     Past Medical History:  Diagnosis Date  . Abnormal breast finding 2018   per pt/ having  a lot drainage from left breast nipple  . Breast cancer ([place]) 11/2016   left/  . GERD (gastroesophageal reflux disease)    TUMS as  needed  . HTN (hypertension)    states BP has been high recently; has been on med. x 20 yr.  . Hyperlipidemia   . Insulin dependent diabetes mellitus ([place])   . Personal history of chemotherapy    2018/finished 6 weeks of chemo in Sep 2018  . Personal history of radiation therapy    2018 left breast/finished radiation in Sept 2018 per pt.    Past Surgical History:  Procedure Laterality Date  . BREAST LUMPECTOMY Left 05/05/2008  . BREAST LUMPECTOMY Left 12/07/2016   malignant  . BREAST LUMPECTOMY WITH RADIOACTIVE SEED AND SENTINEL LYMPH NODE BIOPSY Left 12/07/2016   Procedure: LEFT BREAST LUMPECTOMY WITH RADIOACTIVE SEED AND SENTINEL LYMPH NODE BIOPSY;  Surgeon: Erroll Luna, MD;  Location: [place];  Service: General;  Laterality: Left;  . IR FLUORO GUIDE PORT INSERTION RIGHT  01/04/2017  . IR US GUIDE VASC ACCESS RIGHT  01/04/2017  . PORT-A-CATH REMOVAL N/A 05/30/2017   Procedure: REMOVAL PORT-A-CATH;  Surgeon: Erroll Luna, MD;  Location: [place];  Service: General;  Laterality: N/A;  . RE-EXCISION OF BREAST LUMPECTOMY Left 12/28/2016   Procedure: RE-EXCISION OF BREAST LUMPECTOMY;  Surgeon: Erroll Luna, MD;  Location: [place] [facility];  Service: General;  Laterality: Left;     Current Outpatient Medications  Medication Sig Dispense Refill  . amLODipine (NORVASC) 10 MG tablet TAKE 1 TABLET BY MOUTH EVERY DAY 90 tablet 1  .  aspirin 81 MG tablet Take 81 mg by mouth daily.      . B-D INS SYRINGE 0.5CC/31GX5/16 31G X 5/16" 0.5 ML MISC USE AS DIRECTED 4 TIMES A DAY 300 each 1  . busPIRone (BUSPAR) 7.5 MG tablet TAKE 1 TABLET (7.5 MG TOTAL) BY MOUTH 3 (THREE) TIMES DAILY. 270 tablet 1  . carvedilol (COREG) 25 MG tablet Take 1 tablet (25 mg total) by mouth 2 (two) times daily with a meal. 180 tablet 2  . cloNIDine (CATAPRES) 0.3 MG tablet Take 1 tablet (0.3 mg total) by mouth 2 (two) times daily. 180 tablet 2  . doxycycline (ADOXA) 100 MG tablet Take 100 mg by  mouth 2 (two) times daily.    . Insulin Glargine (LANTUS SOLOSTAR) 100 UNIT/ML Solostar Pen Inject 24 units into the skin 10 pen 5  . Insulin Pen Needle 31G X 5 MM MISC Use pen needles for insulin injection daily 100 each 3  . insulin regular (NOVOLIN R,HUMULIN R) 100 units/mL injection Inject 0.1-0.16 mLs (10-16 Units total) into the skin 3 (three) times daily before meals. Relion. Please provide insulin syringes needed 20 mL 11  . Insulin Syringe-Needle U-100 (B-D INS SYRINGE 0.5CC/30GX1/2") 30G X 1/2" 0.5 ML MISC USE AS DIRECTED 4 TIMES A DAY 300 each 3  . letrozole (FEMARA) 2.5 MG tablet Take 1 tablet (2.5 mg total) by mouth daily. 90 tablet 1  . lisinopril-hydrochlorothiazide (PRINZIDE,ZESTORETIC) 10-12.5 MG tablet TAKE 1 TABLET BY MOUTH EVERY DAY 90 tablet 3  . lovastatin (MEVACOR) 40 MG tablet Take 1 tablet (40 mg total) by mouth at bedtime. 90 tablet 3  . metFORMIN (GLUCOPHAGE) 500 MG tablet TAKE 2 TABLETS BY MOUTH DAILY WITH SUPPER. 180 tablet 0  . Na Sulfate-K Sulfate-Mg Sulf 17.5-3.13-1.6 GM/177ML SOLN Take 1 kit by mouth as directed. 354 mL 0  . OVER THE COUNTER MEDICATION Take 1 tablet by mouth daily as needed. Stool softner for constipation, also drinks warm prune juice prn     No current facility-administered medications for this visit.     Allergies:   Patient has no known allergies.    Social History:  The patient  reports that she has quit smoking. Her smoking use included cigarettes. She quit smokeless tobacco use about 10 months ago.  Her smokeless tobacco use included snuff. She reports that she does not drink alcohol or use drugs.   Family History:  The patient's family history includes Arthritis in her sister; Asthma in her child; Cancer (age of onset: 19) in her father; Diabetes in her brother, child, child, child, paternal aunt, paternal grandmother, and sister; Emphysema in her brother; Heart attack in her brother; Heart disease in her brother; Heart failure in her  sister; Hypertension in her child.    ROS:  Please see the history of present illness.   Otherwise, review of systems are positive for none.   All other systems are reviewed and negative.    PHYSICAL EXAM: VS:  BP (!) 112/56   Pulse 74   Ht _0  (1.575 m)   Wt 187 lb 6.4 oz (85 kg)   LMP  (LMP Unknown)   BMI 34.28 kg/m  , BMI Body mass index is 34.28 kg/m. GEN: Well nourished, well developed, in no acute distress HEENT: sclera anicteric Neck: no JVD, carotid bruits, or masses Cardiac: RRR; no murmurs, rubs, or gallops,no edema  Respiratory:  clear to auscultation bilaterally, normal work of breathing GI: soft, obese, nontender, nondistended, + BS MS: no  deformity or atrophy Skin: warm and dry, no rash Neuro:  Strength and sensation are intact Psych: euthymic mood, full affect   EKG:  EKG is not ordered today.   Recent Labs: 07/16/2018: Pro B Natriuretic peptide (BNP) 44.0 09/12/2018: ALT 5; BUN 19; Creatinine, Ser 1.11; Hemoglobin 11.6; Platelets 277.0; Potassium 4.3; Sodium 138    Lipid Panel    Component Value Date/Time   CHOL 206 (H) 10/10/2017 1040   TRIG 113.0 10/10/2017 1040   HDL 65.20 10/10/2017 1040   CHOLHDL 3 10/10/2017 1040   VLDL 22.6 10/10/2017 1040   LDLCALC 119 (H) 10/10/2017 1040   LDLDIRECT 140.0 09/12/2018 1114      Wt Readings from Last 3 Encounters:  10/11/18 187 lb 6.4 oz (85 kg)  09/26/18 187 lb (84.8 kg)  09/26/18 184 lb 12.8 oz (83.8 kg)      Other studies Reviewed: Additional studies/ records that were reviewed today include:   Echocardiogram 08/2018: Study Conclusions  - Left ventricle: Moderate basal septal hypertrophy. The cavity   size was normal. Systolic function was normal. The estimated   ejection fraction was in the range of 55% to 60%. Wall motion was   normal; there were no regional wall motion abnormalities. Left   ventricular diastolic function parameters were normal. - Mitral valve: There was mild  regurgitation. - Atrial septum: There was increased thickness of the septum,   consistent with lipomatous hypertrophy. No defect or patent   foramen ovale was identified. - Pulmonary arteries: PA peak pressure: 35 mm Hg (S).  NST 09/2018:  The left ventricular ejection fraction is normal (55-65%).  Nuclear stress EF: 62%.  There was no ST segment deviation noted during stress.  The study is normal.  This is a low risk study.   Normal resting and stress perfusion. No ischemia or infarction EF 62%  ASSESSMENT AND PLAN:  1. SOB: unclear etiology at this point. Possibly related to left breast abscess which is improving. Echo 08/2018 without evidence of LV systolic or diastolic dysfunction. NST without evidence of ischemia. No PE on CT Chest however did reveal a lesion concerning for possible abscess vs necrotic mass in the left breast. She was recommended for PFTs but has not completed these yet.  - Will assist with scheduling PFTs to complete her work-up.   2. HTN: BP well controlled at 112/56 today. - Continue amlodipine, carvedilol, clonidine, lisinopril, and HCTZ  3. HLD: LDL 98 05/2018; lipids followed by PCP - Continue lovastatin  4. Tobacco abuse: she has been using snuff since the age of 28. She is interested in quitting and understands risks of continued use. Has not tried gums or patches to assist with quitting but is open to this.  - Continue to encourage tobacco cessation  5. Breast cancer: follows with Dr. Burr Medico. Currently on Letrozole after undergoing lumpectomy, chemo, and radiation.  - Continue routine follow-up with Dr. Burr Medico  6. DM type 2: poorly controlled with A1C 8.6 on insulin.  - Continue to follow closely with PCP for improved glycemic control  7. Preoperative assessment: request received 08/23/18 in anticipation of possible surgery for management of left breast abscess. She reports this has been improving without surgical intervention and is being monitored  by Dr. Burr Medico. Echo 08/2018 was without evidence of LV systolic or diastolic dysfunction. NST without evidence of ischemia.  - Does not appear there are active plans for surgery at this point however in the event that her circumstances change, patient would be  at an acceptable risk for surgery without further cardiac work-up at this time    Current medicines are reviewed at length with the patient today.  The patient does not have concerns regarding medicines.  The following changes have been made:  no change  Labs/ tests ordered today include:   Orders Placed This Encounter  Procedures  . Pulmonary function test     Disposition:   FU with Dr. Oval Linsey in 1 year  Signed, Abigail Butts, PA-C  10/11/2018 9:18 AM

## 2018-10-11 NOTE — Patient Instructions (Signed)
Medication Instructions:  NO CHANGE If you need a refill on your cardiac medications before your next appointment, please call your pharmacy.   Lab work: If you have labs (blood work) drawn today and your tests are completely normal, you will receive your results only by: Marland Kitchen MyChart Message (if you have MyChart) OR . A paper copy in the mail If you have any lab test that is abnormal or we need to change your treatment, we will call you to review the results.  Testing/Procedures: Your physician has recommended that you have a pulmonary function test. Pulmonary Function Tests are a group of tests that measure how well air moves in and out of your lungs. SCHEDULE AT [place]    Follow-Up: At [facility], you and your health needs are our priority.  As part of our continuing mission to provide you with exceptional heart care, we have created designated Provider Care Teams.  These Care Teams include your primary Cardiologist (physician) and Advanced Practice Providers (APPs -  Physician Assistants and Nurse Practitioners) who all work together to provide you with the care you need, when you need it. You will need a follow up appointment in 12 months.  Please call our office 2 months in advance to schedule this appointment.  You may see TIFFANY Upson MD or one of the following Advanced Practice Providers on your designated Care Team:   Kerin Ransom, PA-C Roby Lofts, [place] . Sande Rives, PA-C

## 2018-10-15 ENCOUNTER — Ambulatory Visit (INDEPENDENT_AMBULATORY_CARE_PROVIDER_SITE_OTHER): Payer: Medicare Other | Admitting: Family Medicine

## 2018-10-15 ENCOUNTER — Encounter: Payer: Self-pay | Admitting: Family Medicine

## 2018-10-15 VITALS — BP 132/80 | HR 79 | Temp 97.7°F | Ht 62.0 in | Wt 189.0 lb

## 2018-10-15 DIAGNOSIS — I10 Essential (primary) hypertension: Secondary | ICD-10-CM

## 2018-10-15 DIAGNOSIS — F439 Reaction to severe stress, unspecified: Secondary | ICD-10-CM

## 2018-10-15 NOTE — Patient Instructions (Addendum)
Blood pressure looks better  I do want you to keep all your upcoming appointments  Why dont we check in 4-6 months from now- if you need me sooner - get on in here! And we will do our best to help you

## 2018-10-15 NOTE — Assessment & Plan Note (Signed)
S: controlled on lisinopril hydrochlorothiazide 10-12.5 mg, amlodipine 10 mg, Coreg 25 mg twice a day, clonidine 0.3 mg twice daily  Last visit here her blood pressure was high-she admitted to very high stress at that time-had just that day found out about several tax bills her now deceased husband did not pay BP Readings from Last 3 Encounters:  10/15/18 132/80  10/11/18 (!) 112/56  09/26/18 122/60  A/P: We discussed blood pressure goal of <140/90. Continue current meds: Glad this has improved with decreased stress-also sees she has had several other visits with it well controlled

## 2018-10-15 NOTE — Progress Notes (Signed)
Subjective:  Wendy [name] is a 78 y.o. year old very pleasant female patient who presents for/with See problem oriented charting ROS- No chest pain or shortness of breath. No headache or blurry vision.  Breast pain and discharge much better recently-no recent discharge  Past Medical History-  Patient Active Problem List   Diagnosis Date Noted  . Shortness of breath 03/14/2017    Priority: High  . Malignant neoplasm of upper-outer quadrant of left breast in female, estrogen receptor positive ([place]) 11/20/2016    Priority: High  . 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 Chronic diastolic heart failure ([place]) 07/06/2011    Priority: High  . Rectal bleeding 01/17/2017    Priority: Medium  . CKD (chronic kidney disease) stage 3, GFR 30-59 ml/min (HCC) 03/17/2016    Priority: Medium  . Osteopenia 01/05/2016    Priority: Medium  . TOBACCO USE 04/13/2008    Priority: Medium  . Hyperlipidemia 11/29/2006    Priority: Medium  . OBESITY, BMI 30-35 11/29/2006    Priority: Medium  . HYPERTENSION, BENIGN SYSTEMIC 11/29/2006    Priority: Medium  . Fatigue 03/14/2017    Priority: Low  . Edema 03/14/2017    Priority: Low  . Port catheter in place 01/18/2017    Priority: Low  . Constipation 01/17/2017    Priority: Low  . Seasonal allergies 12/01/2013    Priority: Low  . Grief 05/10/2018  . Anemia 01/09/2018    Medications- reviewed and updated Current Outpatient Medications  Medication Sig Dispense Refill  . amLODipine (NORVASC) 10 MG tablet TAKE 1 TABLET BY MOUTH EVERY DAY 90 tablet 1  . aspirin 81 MG tablet Take 81 mg by mouth daily.      . B-D INS SYRINGE 0.5CC/31GX5/16 31G X 5/16" 0.5 ML MISC USE AS DIRECTED 4 TIMES A DAY 300 each 1  . busPIRone (BUSPAR) 7.5 MG tablet TAKE 1 TABLET (7.5 MG TOTAL) BY MOUTH 3 (THREE) TIMES DAILY. 270 tablet 1  .  carvedilol (COREG) 25 MG tablet Take 1 tablet (25 mg total) by mouth 2 (two) times daily with a meal. 180 tablet 2  . cloNIDine (CATAPRES) 0.3 MG tablet Take 1 tablet (0.3 mg total) by mouth 2 (two) times daily. 180 tablet 2  . doxycycline (ADOXA) 100 MG tablet Take 100 mg by mouth 2 (two) times daily.    . Insulin Glargine (LANTUS SOLOSTAR) 100 UNIT/ML Solostar Pen Inject 24 units into the skin 10 pen 5  . Insulin Pen Needle 31G X 5 MM MISC Use pen needles for insulin injection daily 100 each 3  . insulin regular (NOVOLIN R,HUMULIN R) 100 units/mL injection Inject 0.1-0.16 mLs (10-16 Units total) into the skin 3 (three) times daily before meals. Relion. Please provide insulin syringes needed 20 mL 11  . Insulin Syringe-Needle U-100 (B-D INS SYRINGE 0.5CC/30GX1/2") 30G X 1/2" 0.5 ML MISC USE AS DIRECTED 4 TIMES A DAY 300 each 3  . letrozole (FEMARA) 2.5 MG tablet Take 1 tablet (2.5 mg total) by mouth daily. 90 tablet 1  . lisinopril-hydrochlorothiazide (PRINZIDE,ZESTORETIC) 10-12.5 MG tablet TAKE 1 TABLET BY MOUTH EVERY DAY 90 tablet 3  . lovastatin (MEVACOR) 40 MG tablet Take 1 tablet (40 mg total) by mouth at bedtime. 90 tablet 3  . metFORMIN (GLUCOPHAGE) 500 MG tablet TAKE 2 TABLETS BY MOUTH DAILY WITH SUPPER. 180 tablet 0  . Na  Sulfate-K Sulfate-Mg Sulf 17.5-3.13-1.6 GM/177ML SOLN Take 1 kit by mouth as directed. 354 mL 0  . OVER THE COUNTER MEDICATION Take 1 tablet by mouth daily as needed. Stool softner for constipation, also drinks warm prune juice prn     No current facility-administered medications for this visit.     Objective: BP 132/80 (BP Location: Left Arm, Patient Position: Sitting, Cuff Size: Large)   Pulse 79   Temp 97.7 F (36.5 C) (Oral)   Ht _0  (1.575 m)   Wt 189 lb (85.7 kg)   LMP  (LMP Unknown)   SpO2 99%   BMI 34.57 kg/m  Gen: NAD, resting comfortably CV: RRR no murmurs rubs or gallops Lungs: CTAB no crackles, wheeze, rhonchi Abdomen:  soft/nontender/nondistended/normal bowel sounds. Ext: trace edema Skin: warm, dry   Diabetic Foot Exam - Simple   Simple Foot Form Diabetic Foot exam was performed with the following findings:  Yes 10/15/2018 11:08 AM  Visual Inspection No deformities, no ulcerations, no other skin breakdown bilaterally:  Yes Sensation Testing Intact to touch and monofilament testing bilaterally:  Yes Pulse Check Posterior Tibialis and Dorsalis pulse intact bilaterally:  Yes Comments     Assessment/Plan:  Other notes: 1.left breast finally doing better . Does have recent CT scan November 2019 and plan for repeat in 2 months due to an enlarged lymph node- likely related to recent surgery.  2.  We spent most of the visit discussing her home stressors-her son seems to be drinking more and her daughter has gained an extreme amount of weight.  HYPERTENSION, BENIGN SYSTEMIC S: controlled on lisinopril hydrochlorothiazide 10-12.5 mg, amlodipine 10 mg, Coreg 25 mg twice a day, clonidine 0.3 mg twice daily  Last visit here her blood pressure was high-she admitted to very high stress at that time-had just that day found out about several tax bills her now deceased husband did not pay BP Readings from Last 3 Encounters:  10/15/18 132/80  10/11/18 (!) 112/56  09/26/18 122/60  A/P: We discussed blood pressure goal of <140/90. Continue current meds: Glad this has improved with decreased stress-also sees she has had several other visits with it well controlled   Future Appointments  Date Time Provider [place]  10/16/2018 12:00 PM LBPU-PFT RM LBPU-PULCARE None  10/24/2018 10:30 AM LBCT-CT 1 LBCT-CT LB-CT [facility]  11/05/2018 10:20 AM GI-BCG DIAG TOMO 1 GI-BCGMM GI-BREAST CE  12/24/2018 10:30 AM Philemon Kingdom, MD LBPC-LBENDO None  01/10/2019  9:15 AM [facility]-MEDONC LAB 2 [facility]-MEDONC None  01/10/2019  9:45 AM [name], MD [facility]CC-MEDONC None  03/18/2019 11:00 AM Marin Olp, MD LBPC-HPC PEC     Time  Stamp The duration of face-to-face time during this visit was greater than 15 minutes. Greater than 50% of this time was spent in counseling, explanation of diagnosis, planning of further management, and/or coordination of care including discussion of home stressors-particularly son and daughter not doing well-also still grieving loss of husband.    Return precautions advised.  Garret Reddish, MD

## 2018-10-16 ENCOUNTER — Ambulatory Visit (INDEPENDENT_AMBULATORY_CARE_PROVIDER_SITE_OTHER): Payer: Medicare Other | Admitting: Internal Medicine

## 2018-10-16 DIAGNOSIS — R0602 Shortness of breath: Secondary | ICD-10-CM

## 2018-10-16 LAB — PULMONARY FUNCTION TEST
FEF 25-75 Post: 1.08 L/sec
FEF 25-75 Pre: 0.8 L/sec
FEF2575-%Change-Post: 34 %
FEF2575-%Pred-Post: 83 %
FEF2575-%Pred-Pre: 62 %
FEV1-%Change-Post: 7 %
FEV1-%Pred-Post: 78 %
FEV1-%Pred-Pre: 73 %
FEV1-POST: 1.15 L
FEV1-Pre: 1.07 L
FEV1FVC-%Change-Post: 1 %
FEV1FVC-%Pred-Pre: 100 %
FEV6-%Change-Post: 5 %
FEV6-%PRED-PRE: 77 %
FEV6-%Pred-Post: 81 %
FEV6-Post: 1.48 L
FEV6-Pre: 1.4 L
FEV6FVC-%CHANGE-POST: 0 %
FEV6FVC-%Pred-Post: 103 %
FEV6FVC-%Pred-Pre: 104 %
FVC-%Change-Post: 6 %
FVC-%Pred-Post: 78 %
FVC-%Pred-Pre: 73 %
FVC-Post: 1.49 L
FVC-Pre: 1.41 L
PRE FEV6/FVC RATIO: 100 %
Post FEV1/FVC ratio: 77 %
Post FEV6/FVC ratio: 99 %
Pre FEV1/FVC ratio: 76 %

## 2018-10-16 NOTE — Progress Notes (Signed)
PFT completed today.  

## 2018-10-17 ENCOUNTER — Telehealth: Payer: Self-pay | Admitting: *Deleted

## 2018-10-17 ENCOUNTER — Encounter: Payer: Self-pay | Admitting: *Deleted

## 2018-10-17 ENCOUNTER — Telehealth: Payer: Self-pay

## 2018-10-17 NOTE — Telephone Encounter (Signed)
This encounter was created in error - please disregard.

## 2018-10-17 NOTE — Telephone Encounter (Signed)
[employer] patient assistance refill request received for lantus, filled out and faxed back.

## 2018-10-17 NOTE — Telephone Encounter (Signed)
-----   Message from Lawana Pai sent at 09/26/2018  9:51 AM EST ----- Regarding: RE: ct This patient was scheduled for nuc today and to see hao as well.  She left and didn't have nuc which has been cancelled several times.  She never had her PFTs.  I'll have stacey call her about CT.  ----- Message ----- From: Earvin Hansen, LPN Sent: 54/86/2824   1:45 PM EST To: Earvin Hansen, LPN, Deedie C Creed, # Subject: ct                                             Hello all, Just following up on CT that is supposed to be scheduled mid January Thanks  [place]

## 2018-10-24 ENCOUNTER — Inpatient Hospital Stay: Admission: RE | Admit: 2018-10-24 | Payer: Medicare Other | Source: Ambulatory Visit

## 2018-10-25 DIAGNOSIS — N611 Abscess of the breast and nipple: Secondary | ICD-10-CM | POA: Diagnosis not present

## 2018-10-25 DIAGNOSIS — Z853 Personal history of malignant neoplasm of breast: Secondary | ICD-10-CM | POA: Diagnosis not present

## 2018-10-31 DIAGNOSIS — E113511 Type 2 diabetes mellitus with proliferative diabetic retinopathy with macular edema, right eye: Secondary | ICD-10-CM | POA: Diagnosis not present

## 2018-11-04 ENCOUNTER — Inpatient Hospital Stay: Admission: RE | Admit: 2018-11-04 | Payer: Medicare Other | Source: Ambulatory Visit

## 2018-11-20 ENCOUNTER — Encounter: Payer: Self-pay | Admitting: Family Medicine

## 2018-12-02 ENCOUNTER — Telehealth: Payer: Self-pay

## 2018-12-02 NOTE — Telephone Encounter (Unsigned)
Copied from [facility] [id number]. Topic: General - Inquiry >> Nov 27, 2018  1:51 PM Scherrie Gerlach wrote: Reason for CRM: pt saw on mychart she is overdue for a tetanus vaccine. She wants to know if she really needs to get this.

## 2018-12-02 NOTE — Telephone Encounter (Signed)
Pt informed that she is due fot Td and was told to go to the local pharmacy. Pt verbalized understadning. Pt mychart was also addressed. Pt stated she has some sx of anxiety and that she always have those. I aksed pt is she taking in Rx for the issue she stated yes. I also asked if she has has a lot of anxiety attacks lately she states yes. Pt has been scheduled for this week. No further action needed at this time.

## 2018-12-02 NOTE — Telephone Encounter (Signed)
I do not see that she is scheduled this week-next visit with me appears to be June 16.  What date what she scheduled?

## 2018-12-03 DIAGNOSIS — E113513 Type 2 diabetes mellitus with proliferative diabetic retinopathy with macular edema, bilateral: Secondary | ICD-10-CM | POA: Diagnosis not present

## 2018-12-03 DIAGNOSIS — H35373 Puckering of macula, bilateral: Secondary | ICD-10-CM | POA: Diagnosis not present

## 2018-12-03 DIAGNOSIS — H40013 Open angle with borderline findings, low risk, bilateral: Secondary | ICD-10-CM | POA: Diagnosis not present

## 2018-12-03 DIAGNOSIS — H35372 Puckering of macula, left eye: Secondary | ICD-10-CM | POA: Diagnosis not present

## 2018-12-03 LAB — HM DIABETES EYE EXAM

## 2018-12-03 NOTE — Telephone Encounter (Signed)
Patient is scheduled   

## 2018-12-03 NOTE — Telephone Encounter (Signed)
Noted  

## 2018-12-03 NOTE — Telephone Encounter (Signed)
Marcene Brawn can you please schedule pt for this Thursday at 1pm anxiety f/u

## 2018-12-05 ENCOUNTER — Ambulatory Visit (INDEPENDENT_AMBULATORY_CARE_PROVIDER_SITE_OTHER): Payer: Medicare Other | Admitting: Family Medicine

## 2018-12-05 ENCOUNTER — Encounter: Payer: Self-pay | Admitting: Family Medicine

## 2018-12-05 VITALS — BP 130/78 | HR 92 | Temp 97.7°F | Ht 62.0 in | Wt 190.4 lb

## 2018-12-05 DIAGNOSIS — E1169 Type 2 diabetes mellitus with other specified complication: Secondary | ICD-10-CM

## 2018-12-05 DIAGNOSIS — I5189 Other ill-defined heart diseases: Secondary | ICD-10-CM

## 2018-12-05 DIAGNOSIS — F325 Major depressive disorder, single episode, in full remission: Secondary | ICD-10-CM | POA: Insufficient documentation

## 2018-12-05 DIAGNOSIS — I10 Essential (primary) hypertension: Secondary | ICD-10-CM | POA: Diagnosis not present

## 2018-12-05 DIAGNOSIS — E785 Hyperlipidemia, unspecified: Secondary | ICD-10-CM

## 2018-12-05 DIAGNOSIS — F320 Major depressive disorder, single episode, mild: Secondary | ICD-10-CM

## 2018-12-05 MED ORDER — BUSPIRONE HCL 7.5 MG PO TABS: 7.5000 mg | ORAL_TABLET | Freq: Three times a day (TID) | ORAL | 1 refills | Status: DC

## 2018-12-05 NOTE — Patient Instructions (Addendum)
Health Maintenance Due  Topic Date Due  . TETANUS/TDAP-would recommend you get this at your pharmacy 03/02/2009  . HEMOGLOBIN A1C-make sure to keep visit with Dr. Cruzita Lederer later this month 12/05/2018   You appear to have a mild case of depression.  You also continue to deal with anxiety.  I want you to start counseling-see handout for [facility].  You may continue buspirone which I refilled for you today for anxiety  Lets check back in 3 to 4 months from now.  Happy to see you sooner if needed

## 2018-12-05 NOTE — Assessment & Plan Note (Signed)
Anxiety  s: remains very stressed with kids. Feels nervous and down at times. Tough being alone. Has a lot of stress with kids but on other hand doesn't enjoy being alone. Has a lot of time on her hands and that's hard. Sometimes doesn't sleep well. Buspirone 7.5 mg three times a day helpful. No thoughts of self harm.   GAD7 of 9. PHQ9 of 8   Office Visit from 12/05/2018 in [facility]  PHQ-9 Total Score  8    A/P: Mild depression today noted.  Patient continues to deal with some anxiety-she will continue buspirone for the anxiety portion.  We discussed adding an SSRI potentially versus counseling for the depression-she would prefer to try counseling.  Gave her a handout from of our behavioral health and discussed it may take some time to get in-if she has new or worsening symptoms would want to see her back-otherwise see her in 3 to 4 months

## 2018-12-05 NOTE — Progress Notes (Signed)
Phone [phone]   Subjective:  SHATIKA GRINNELL is a 78 y.o. year old very pleasant female patient who presents for/with See problem oriented charting ROS-complains of anxiety and depressed mood.  No reported shortness of breath or chest pain today.  No suicidal ideation  Past Medical History-  Patient Active Problem List   Diagnosis Date Noted  . Shortness of breath 03/14/2017    Priority: High  . Malignant neoplasm of upper-outer quadrant of left breast in female, estrogen receptor positive ([place]) 11/20/2016    Priority: High  . 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 Diastolic dysfunction 92/42/6834    Priority: High  . Depression, major, single episode, mild ([place]) 12/05/2018    Priority: Medium  . Rectal bleeding 01/17/2017    Priority: Medium  . CKD (chronic kidney disease) stage 3, GFR 30-59 ml/min (HCC) 03/17/2016    Priority: Medium  . Osteopenia 01/05/2016    Priority: Medium  . TOBACCO USE 04/13/2008    Priority: Medium  . Hyperlipidemia associated with type 2 diabetes mellitus ([place]) 11/29/2006    Priority: Medium  . OBESITY, BMI 30-35 11/29/2006    Priority: Medium  . HYPERTENSION, BENIGN SYSTEMIC 11/29/2006    Priority: Medium  . Fatigue 03/14/2017    Priority: Low  . Edema 03/14/2017    Priority: Low  . Port catheter in place 01/18/2017    Priority: Low  . Constipation 01/17/2017    Priority: Low  . Seasonal allergies 12/01/2013    Priority: Low  . Grief 05/10/2018  . Anemia 01/09/2018    Medications- reviewed and updated Current Outpatient Medications  Medication Sig Dispense Refill  . amLODipine (NORVASC) 10 MG tablet TAKE 1 TABLET BY MOUTH EVERY DAY 90 tablet 1  . aspirin 81 MG tablet Take 81 mg by mouth daily.      . B-D INS SYRINGE 0.5CC/31GX5/16 31G X 5/16" 0.5 ML MISC USE AS DIRECTED 4 TIMES A DAY 300 each 1  .  busPIRone (BUSPAR) 7.5 MG tablet Take 1 tablet (7.5 mg total) by mouth 3 (three) times daily. 270 tablet 1  . carvedilol (COREG) 25 MG tablet Take 1 tablet (25 mg total) by mouth 2 (two) times daily with a meal. 180 tablet 2  . cloNIDine (CATAPRES) 0.3 MG tablet Take 1 tablet (0.3 mg total) by mouth 2 (two) times daily. 180 tablet 2  . Insulin Glargine (LANTUS SOLOSTAR) 100 UNIT/ML Solostar Pen Inject 24 units into the skin 10 pen 5  . Insulin Pen Needle 31G X 5 MM MISC Use pen needles for insulin injection daily 100 each 3  . insulin regular (NOVOLIN R,HUMULIN R) 100 units/mL injection Inject 0.1-0.16 mLs (10-16 Units total) into the skin 3 (three) times daily before meals. Relion. Please provide insulin syringes needed 20 mL 11  . Insulin Syringe-Needle U-100 (B-D INS SYRINGE 0.5CC/30GX1/2") 30G X 1/2" 0.5 ML MISC USE AS DIRECTED 4 TIMES A DAY 300 each 3  . letrozole (FEMARA) 2.5 MG tablet Take 1 tablet (2.5 mg total) by mouth daily. 90 tablet 1  . lisinopril-hydrochlorothiazide (PRINZIDE,ZESTORETIC) 10-12.5 MG tablet TAKE 1 TABLET BY MOUTH EVERY DAY 90 tablet 3  . lovastatin (MEVACOR) 40 MG tablet Take 1 tablet (40 mg total) by mouth at bedtime. 90 tablet 3  . metFORMIN (GLUCOPHAGE) 500 MG tablet TAKE 2 TABLETS BY MOUTH DAILY WITH SUPPER. 180 tablet 0  .  Na Sulfate-K Sulfate-Mg Sulf 17.5-3.13-1.6 GM/177ML SOLN Take 1 kit by mouth as directed. 354 mL 0  . OVER THE COUNTER MEDICATION Take 1 tablet by mouth daily as needed. Stool softner for constipation, also drinks warm prune juice prn     No current facility-administered medications for this visit.      Objective:  BP 130/78 (BP Location: Left Arm, Patient Position: Sitting, Cuff Size: Normal)   Pulse 92   Temp 97.7 F (36.5 C) (Oral)   Ht '5\' 2"'$  (1.575 m)   Wt 190 lb 6.4 oz (86.4 kg)   LMP  (LMP Unknown)   SpO2 99%   BMI 34.82 kg/m  Gen: NAD, resting comfortably CV: RRR no murmurs rubs or gallops Lungs: CTAB no crackles, wheeze,  rhonchi Ext: no edema Skin: warm, dry Psych: Down mood, somewhat tearful    Assessment and Plan   Diastolic dysfunction Chronic diastolic heart failure (HCC), Chronic --> Diastolic dysfunction grade 2 S: Changing title of this from heart failure to diastolic dysfunction is patient is not on Lasix regularly.  Denies history of hospitalization for heart failure. A/P: Appears stable-trace edema.  Amlodipine is not ideal but needs this for blood pressure control.  Depression, major, single episode, mild (HCC) Anxiety  s: remains very stressed with kids. Feels nervous and down at times. Tough being alone. Has a lot of stress with kids but on other hand doesn't enjoy being alone. Has a lot of time on her hands and that's hard. Sometimes doesn't sleep well. Buspirone 7.5 mg three times a day helpful. No thoughts of self harm.   GAD7 of 9. PHQ9 of 8   Office Visit from 12/05/2018 in [facility]  PHQ-9 Total Score  8    A/P: Mild depression today noted.  Patient continues to deal with some anxiety-she will continue buspirone for the anxiety portion.  We discussed adding an SSRI potentially versus counseling for the depression-she would prefer to try counseling.  Gave her a handout from of our behavioral health and discussed it may take some time to get in-if she has new or worsening symptoms would want to see her back-otherwise see her in 3 to 4 months  #Hypertension associated with diabetes S: Resistant hypertension- but controlled onLisinopril hctz 10-12.'5mg'$ , amlodipine '10mg'$ , coreg '25mg'$  BID, clonidine 0.3 mg BID.  A/P:  Stable. Continue current medications.      #Hyperlipidemia associated with diabetes S: Compliant with lovastatin 40 mg.  Intolerant to other statins due to myalgias A/P:  Stable with mild poor control of LDL at 119. Continue current medications.     Future Appointments  Date Time Provider [place]  12/24/2018 10:30 AM Philemon Kingdom, MD  LBPC-LBENDO None  01/10/2019  9:15 AM CH[facility]-MEDONC LAB 2 [facility]-MEDONC None  01/10/2019  9:45 AM [name], MD CHCC-MEDONC None  01/15/2019 10:50 AM GI-BCG DIAG TOMO 2 GI-BCGMM GI-BREAST CE  03/18/2019 11:00 AM Marin Olp, MD LBPC-HPC PEC  04/07/2019 10:40 AM Marin Olp, MD LBPC-HPC PEC    Advised 3 to 2-monthfollow-up  Lab/Order associations: Hyperlipidemia associated with type 2 diabetes mellitus ([place]  Diastolic dysfunction, Chronic  Depression, major, single episode, mild (HCC)  HYPERTENSION, BENIGN SYSTEMIC  Meds ordered this encounter  Medications  . busPIRone (BUSPAR) 7.5 MG tablet    Sig: Take 1 tablet (7.5 mg total) by mouth 3 (three) times daily.    Dispense:  270 tablet    Refill:  1    Return precautions advised.  Garret Reddish, MD

## 2018-12-05 NOTE — Assessment & Plan Note (Signed)
Chronic diastolic heart failure (HCC), Chronic --> Diastolic dysfunction grade 2 S: Changing title of this from heart failure to diastolic dysfunction is patient is not on Lasix regularly.  Denies history of hospitalization for heart failure. A/P: Appears stable-trace edema.  Amlodipine is not ideal but needs this for blood pressure control.

## 2018-12-23 ENCOUNTER — Other Ambulatory Visit: Payer: Self-pay

## 2018-12-24 ENCOUNTER — Ambulatory Visit: Payer: Medicare Other | Admitting: Internal Medicine

## 2018-12-27 ENCOUNTER — Ambulatory Visit: Payer: Self-pay | Admitting: *Deleted

## 2018-12-27 ENCOUNTER — Ambulatory Visit (INDEPENDENT_AMBULATORY_CARE_PROVIDER_SITE_OTHER): Payer: Medicare Other | Admitting: Family Medicine

## 2018-12-27 ENCOUNTER — Other Ambulatory Visit: Payer: Self-pay | Admitting: Internal Medicine

## 2018-12-27 ENCOUNTER — Encounter: Payer: Self-pay | Admitting: Family Medicine

## 2018-12-27 VITALS — Ht 62.0 in

## 2018-12-27 DIAGNOSIS — IMO0002 Reserved for concepts with insufficient information to code with codable children: Secondary | ICD-10-CM

## 2018-12-27 DIAGNOSIS — E113419 Type 2 diabetes mellitus with severe nonproliferative diabetic retinopathy with macular edema, unspecified eye: Secondary | ICD-10-CM | POA: Diagnosis not present

## 2018-12-27 DIAGNOSIS — N3001 Acute cystitis with hematuria: Secondary | ICD-10-CM | POA: Diagnosis not present

## 2018-12-27 DIAGNOSIS — R300 Dysuria: Secondary | ICD-10-CM | POA: Diagnosis not present

## 2018-12-27 DIAGNOSIS — R350 Frequency of micturition: Secondary | ICD-10-CM | POA: Diagnosis not present

## 2018-12-27 DIAGNOSIS — E1165 Type 2 diabetes mellitus with hyperglycemia: Secondary | ICD-10-CM

## 2018-12-27 DIAGNOSIS — Z794 Long term (current) use of insulin: Secondary | ICD-10-CM | POA: Diagnosis not present

## 2018-12-27 LAB — POCT URINALYSIS DIPSTICK
Bilirubin, UA: NEGATIVE
Glucose, UA: NEGATIVE
Ketones, UA: NEGATIVE
Nitrite, UA: NEGATIVE
PH UA: 6 (ref 5.0–8.0)
Protein, UA: POSITIVE — AB
Spec Grav, UA: 1.015 (ref 1.010–1.025)
Urobilinogen, UA: 0.2 E.U./dL

## 2018-12-27 MED ORDER — CEPHALEXIN 500 MG PO CAPS: 500.0000 mg | ORAL_CAPSULE | Freq: Three times a day (TID) | ORAL | 0 refills | Status: AC

## 2018-12-27 NOTE — Progress Notes (Signed)
Phone [phone]   Subjective:  Virtual visit via Video note  Our team/I connected with Wendy [name] on 12/27/18 at  4:00 PM EDT by a video enabled telemedicine application (webex) and verified that I am speaking with the correct person using two identifiers.  Location patient: Home[place] Location provider: [facility], office Persons participating in the virtual visit: patient and Darrel Reach (helping patient with video)  Our team/I discussed the limitations of evaluation and management by telemedicine and the availability of in person appointments. In light of current covid-19 pandemic, patient also understands that we are trying to protect them by minimizing in office contact if at all possible.  The patient expressed consent for telemedicine visit and agreed to proceed.    Past Medical History-  Patient Active Problem List   Diagnosis Date Noted  . Shortness of breath 03/14/2017    Priority: High  . Malignant neoplasm of upper-outer quadrant of left breast in female, estrogen receptor positive ([place]) 11/20/2016    Priority: High  . 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 Diastolic dysfunction 70/96/2836    Priority: High  . Depression, major, single episode, mild ([place]) 12/05/2018    Priority: Medium  . Rectal bleeding 01/17/2017    Priority: Medium  . CKD (chronic kidney disease) stage 3, GFR 30-59 ml/min (HCC) 03/17/2016    Priority: Medium  . Osteopenia 01/05/2016    Priority: Medium  . TOBACCO USE 04/13/2008    Priority: Medium  . Hyperlipidemia associated with type 2 diabetes mellitus ([place]) 11/29/2006    Priority: Medium  . OBESITY, BMI 30-35 11/29/2006    Priority: Medium  . HYPERTENSION, BENIGN SYSTEMIC 11/29/2006    Priority: Medium  . Fatigue 03/14/2017    Priority: Low  . Edema 03/14/2017    Priority: Low  . Port catheter  in place 01/18/2017    Priority: Low  . Constipation 01/17/2017    Priority: Low  . Seasonal allergies 12/01/2013    Priority: Low  . Grief 05/10/2018  . Anemia 01/09/2018    Medications- reviewed and updated Current Outpatient Medications  Medication Sig Dispense Refill  . amLODipine (NORVASC) 10 MG tablet TAKE 1 TABLET BY MOUTH EVERY DAY 90 tablet 1  . aspirin 81 MG tablet Take 81 mg by mouth daily.      . B-D INS SYRINGE 0.5CC/31GX5/16 31G X 5/16" 0.5 ML MISC USE AS DIRECTED 4 TIMES A DAY 300 each 1  . busPIRone (BUSPAR) 7.5 MG tablet Take 1 tablet (7.5 mg total) by mouth 3 (three) times daily. 270 tablet 1  . carvedilol (COREG) 25 MG tablet Take 1 tablet (25 mg total) by mouth 2 (two) times daily with a meal. 180 tablet 2  . cloNIDine (CATAPRES) 0.3 MG tablet Take 1 tablet (0.3 mg total) by mouth 2 (two) times daily. 180 tablet 2  . Insulin Glargine (LANTUS SOLOSTAR) 100 UNIT/ML Solostar Pen Inject 24 units into the skin 10 pen 5  . Insulin Pen Needle 31G X 5 MM MISC Use pen needles for insulin injection daily 100 each 3  . insulin regular (NOVOLIN R,HUMULIN R) 100 units/mL injection Inject 0.1-0.16 mLs (10-16 Units total) into the skin 3 (three) times daily before meals. Relion. Please provide insulin syringes needed 20 mL 11  . Insulin Syringe-Needle U-100 (B-D INS SYRINGE 0.5CC/30GX1/2") 30G X 1/2" 0.5 ML MISC USE AS DIRECTED 4 TIMES  A DAY 300 each 3  . letrozole (FEMARA) 2.5 MG tablet Take 1 tablet (2.5 mg total) by mouth daily. 90 tablet 1  . lisinopril-hydrochlorothiazide (PRINZIDE,ZESTORETIC) 10-12.5 MG tablet TAKE 1 TABLET BY MOUTH EVERY DAY 90 tablet 3  . lovastatin (MEVACOR) 40 MG tablet Take 1 tablet (40 mg total) by mouth at bedtime. 90 tablet 3  . metFORMIN (GLUCOPHAGE) 500 MG tablet TAKE 2 TABLETS BY MOUTH DAILY WITH SUPPER. 180 tablet 0  . OVER THE COUNTER MEDICATION Take 1 tablet by mouth daily as needed. Stool softner for constipation, also drinks warm prune juice prn     . cephALEXin (KEFLEX) 500 MG capsule Take 1 capsule (500 mg total) by mouth 3 (three) times daily for 7 days. 21 capsule 0  . Na Sulfate-K Sulfate-Mg Sulf 17.5-3.13-1.6 GM/177ML SOLN Take 1 kit by mouth as directed. (Patient not taking: Reported on 12/27/2018) 354 mL 0   No current facility-administered medications for this visit.      Objective:  Ht 5' 2"  (1.575 m)   LMP  (LMP Unknown)   BMI 34.82 kg/m  Gen: NAD, resting comfortably Lungs: nonlabored, normal respiratory rate  Skin: warm, dry, no obvious rash     Assessment and Plan   #Concern for UTI S: Patients symptoms started 2-3 days ago.  Complains of dysuria: just started in last day- mild; polyuria: started out with this first; nocturia: every hour or two; urgency: no. Some straining as well. Also having some low back pain and mild chills.  Tylenol helped. Somewhat darker urine Symptoms are getting worse.  ROS- no fever, vomiting. No blood in urine. No hematuria-  A/P: UA concerning for  Likely UTI. Will get culture given patient age. Empiric treatment with: keflex - if urine culture negative- may be worth urology eval- appears to have had several UA's  Patient to follow up if new or worsening symptoms or failure to improve.   # Diabetes S: poorly controlled. Follows with endocrine. Unfortunately missed last visit.  Lab Results  Component Value Date   HGBA1C 8.6 (A) 09/05/2018   HGBA1C 9.6 (A) 05/30/2018   HGBA1C 9.3 (A) 02/27/2018   A/P: in current covid 19 environment- would ideally like much tighter a1c control- encouraged her to call endocrine but patient prefers to wait until July. Healthy eating and staying aactive can also helpw ith control. Poorly controlled diabetes likely increases UTI risks as well- and certainly polyuria- if symptoms do not improve with antibiotic- may have her come in for bmp, a1c at minimum.   Future Appointments  Date Time Provider [place]  01/10/2019  9:15 AM [facility]-MEDONC LAB 2  [facility]-MED[facility]C None  01/10/2019  9:45 AM [name], MD CHCC-MEDONC None  01/15/2019 11:00 AM GI-BCG DIAG TOMO 1 GI-BCGMM GI-BREAST CE  01/28/2019 11:00 AM [place] Sherrilyn Rist, [place] LBB[facility]-HPC None  02/11/2019 11:00 AM [place] Sherrilyn Rist, [place] [facility]-HPC None  02/25/2019 11:00 AM [place] Sherrilyn Rist, [place] [facility]-HPC None  03/18/2019 11:00 AM Marin Olp, MD LBPC-HPC PEC  04/07/2019 10:40 AM Marin Olp, MD LBPC-HPC PEC  04/10/2019  8:00 AM Philemon Kingdom, MD LBPC-LBENDO None   No follow-ups on file.  Lab/Order associations: Urine frequency - Plan: POCT urinalysis dipstick, Urine Culture  Dysuria - Plan: POCT urinalysis dipstick, Urine Culture  Meds ordered this encounter  Medications  . cephALEXin (KEFLEX) 500 MG capsule    Sig: Take 1 capsule (500 mg total) by mouth 3 (three) times daily for 7 days.  Dispense:  21 capsule    Refill:  0    Return precautions advised.  Garret Reddish, MD

## 2018-12-27 NOTE — Progress Notes (Signed)
Patient came in at 1:30pm to give urine sample for POCT urine dip and Urine culture.

## 2018-12-27 NOTE — Telephone Encounter (Signed)
Patient is scheduled for 4pm today via webex

## 2018-12-27 NOTE — Telephone Encounter (Signed)
Noted  

## 2018-12-27 NOTE — Telephone Encounter (Signed)
Per pt's voicemail she has been having burning with urination and frequent urination; contacted pt and she states that her symptoms started 2 days ago; she also says that her back started bothering her on 12/26/2018; the pt says that she feels warm but she does not have a thermometer; recommendations made per nurse triage protocol; conference call placed withKara at [facility]; she agrees to virtual visit with Dr Yong Channel 12/27/2018 at 1600; this appointment will be scheduled per Marcene Brawn; will route to office for notification.  Reason for Disposition . Side (flank) or lower back pain present  Answer Assessment - Initial Assessment Questions 1. SYMPTOM: "What's the main symptom you're concerned about?" (e.g., frequency, incontinence)     Burning with urination 2. ONSET: "When did the   start?"     12/23/2018 3. PAIN: "Is there any pain?" If so, ask: "How bad is it?" (Scale: 1-10; mild, moderate, severe)    Burning rated 6 out of 10 4. CAUSE: "What do you think is causing the symptoms?"    Bladder or kidney infection 5. OTHER SYMPTOMS: "Do you have any other symptoms?" (e.g., fever, flank pain, blood in urine, pain with urination)    Lower back pain, urinary frequency, urine smells bad 6. PREGNANCY: "Is there any chance you are pregnant?" "When was your last menstrual period?"    no  Protocols used: URINARY [facility]

## 2018-12-27 NOTE — Patient Instructions (Addendum)
Video visit

## 2018-12-29 LAB — URINE CULTURE
MICRO NUMBER:: 358722
SPECIMEN QUALITY:: ADEQUATE

## 2019-01-02 ENCOUNTER — Telehealth: Payer: Self-pay

## 2019-01-03 NOTE — Telephone Encounter (Signed)
Received a fax from [employer] for diabetic supplies. Pt stated they keep calling her and she believes that it is a scam. Form has been shredded.

## 2019-01-07 ENCOUNTER — Telehealth: Payer: Self-pay | Admitting: Hematology

## 2019-01-07 NOTE — Telephone Encounter (Signed)
Rescheduled 4/10 apt per sch msg. Called and spoke with patient. Confirmed new date and time.

## 2019-01-10 ENCOUNTER — Other Ambulatory Visit: Payer: Medicare Other

## 2019-01-10 ENCOUNTER — Ambulatory Visit: Payer: Medicare Other | Admitting: Hematology

## 2019-01-20 ENCOUNTER — Telehealth: Payer: Self-pay | Admitting: Family Medicine

## 2019-01-20 DIAGNOSIS — M542 Cervicalgia: Secondary | ICD-10-CM

## 2019-01-20 NOTE — Telephone Encounter (Signed)
She can BenGay rub or  IcyHot on the neck (make sure to not get in the eye) to see if that helps.   Can also offer her a video visit for this week

## 2019-01-20 NOTE — Telephone Encounter (Signed)
Spoke with patient she is not having numbness,weakness,no slurred speech. Please advise.

## 2019-01-20 NOTE — Telephone Encounter (Signed)
If new or worsening symptoms-particularly chest pain or shortness of breath should let us know immediately.

## 2019-01-20 NOTE — Telephone Encounter (Signed)
Notified patient.Patient voices understanding.Appt scheduled

## 2019-01-20 NOTE — Telephone Encounter (Signed)
See note

## 2019-01-20 NOTE — Telephone Encounter (Signed)
Copied from [place] [id number]. Topic: Quick Communication - See Telephone Encounter >> Jan 20, 2019 12:17 PM Margot Ables wrote: CRM for notification. See Telephone encounter for: 01/20/19 Pt called stating she has neck pain/cramp that is going down in her shoulder on the left side. She said some radiating pain into her arm. Pt does not have BP cuff at home. Pt states it has been 3 days. Pt has taken tylenol and it is still hurting. Pt asking for advice on what to take or a cream to use. Pt denies numbness/tingling. Pt states no headache/dizziness. She said it's just a catch in her neck. Please advise.  [id number] [address], [place] [phone] (Phone) [phone] (Fax)

## 2019-01-22 ENCOUNTER — Ambulatory Visit: Payer: Self-pay | Admitting: *Deleted

## 2019-01-22 NOTE — Telephone Encounter (Signed)
Please call and schedule appointment.

## 2019-01-22 NOTE — Telephone Encounter (Signed)
Patient calls with left-sided neck soreness she also feels in her shoulder and left arm. The sore area in her neck may have some swelling she is unsure. This began Thursday and continues. Increases with movement and lying on the left side. Is relieved when she turns off that side. Denies numbness, tingling and weakness in the arm/hand or leg. Feels a "twinge above her left breast near her back" at times. Denies change of vision/speech. No SOB/sweating/N/V with this pain. She has tried ASA and bengay, neither help. No activity or injury she reports. She has had runny nose, sneezing and congestion just days prior to the aches beginning last week. No fever. No travels and no known exposures.Reviewed cardiac symptoms that sometimes presents in women. She thinks this is cold related. She will take ibuprofen and use a heating pad to see if any improvement. Advised UC. Patient stated she would go to UC if no improvement this afternoon. Stated she understood. She has an appointment on Tuesday with Dr.Hunter. Routing to PCP for appointment tomorrow.   Reason for Disposition . Pain shoots (radiates) into arm or hand . [1] MODERATE neck pain (e.g., interferes with normal activities AND [2] present > 3 days  Answer Assessment - Initial Assessment Questions 1. ONSET: "When did the pain begin?"      Last week, thursday 2. LOCATION: "Where does it hurt?"      Neck left shoulder and left arm 3. PATTERN "Does the pain come and go, or has it been constant since it started?"     Comes and goes 4. SEVERITY: "How bad is the pain?"  (Scale 1-10; or mild, moderate, severe)   - MILD (1-3): doesn't interfere with normal activities    - MODERATE (4-7): interferes with normal activities or awakens from sleep    - SEVERE (8-10):  excruciating pain, unable to do any normal activities     Mild to severe at times especially with movement and lying on the left side.  5. RADIATION: "Does the pain go anywhere else, shoot into your  arms?"     Feels it in the shoulder and upperback 6. CORD SYMPTOMS: "Any weakness or numbness of the arms or legs?"     Left hand feels tight but denies tingling and numbness. 7. CAUSE: "What do you think is causing the neck pain?"     Had a cold just before the pain started last week.  8. NECK OVERUSE: "Any recent activities that involved turning or twisting the neck?"    no 9. OTHER SYMPTOMS: "Do you have any other symptoms?" (e.g., headache, fever, chest pain, difficulty breathing, neck swelling)     Left side of neck might be swollen 10. PREGNANCY: "Is there any chance you are pregnant?" "When was your last menstrual period?"       na  Protocols used: NECK PAIN OR STIFFNESS-A-AH

## 2019-01-23 NOTE — Telephone Encounter (Signed)
Patient has been scheduled for 01/24/19

## 2019-01-24 ENCOUNTER — Ambulatory Visit (INDEPENDENT_AMBULATORY_CARE_PROVIDER_SITE_OTHER): Payer: Medicare Other | Admitting: Family Medicine

## 2019-01-24 ENCOUNTER — Encounter: Payer: Self-pay | Admitting: Family Medicine

## 2019-01-24 VITALS — BP 146/86 | Temp 101.0°F | Ht 62.0 in | Wt 187.0 lb

## 2019-01-24 DIAGNOSIS — E1159 Type 2 diabetes mellitus with other circulatory complications: Secondary | ICD-10-CM

## 2019-01-24 DIAGNOSIS — M542 Cervicalgia: Secondary | ICD-10-CM

## 2019-01-24 DIAGNOSIS — E113419 Type 2 diabetes mellitus with severe nonproliferative diabetic retinopathy with macular edema, unspecified eye: Secondary | ICD-10-CM | POA: Diagnosis not present

## 2019-01-24 DIAGNOSIS — M5412 Radiculopathy, cervical region: Secondary | ICD-10-CM

## 2019-01-24 DIAGNOSIS — E1165 Type 2 diabetes mellitus with hyperglycemia: Secondary | ICD-10-CM | POA: Diagnosis not present

## 2019-01-24 DIAGNOSIS — Z794 Long term (current) use of insulin: Secondary | ICD-10-CM | POA: Diagnosis not present

## 2019-01-24 DIAGNOSIS — I10 Essential (primary) hypertension: Secondary | ICD-10-CM | POA: Diagnosis not present

## 2019-01-24 DIAGNOSIS — IMO0002 Reserved for concepts with insufficient information to code with codable children: Secondary | ICD-10-CM

## 2019-01-24 MED ORDER — PREDNISONE 20 MG PO TABS: ORAL_TABLET | ORAL | 0 refills | Status: DC

## 2019-01-24 NOTE — Patient Instructions (Addendum)
Health Maintenance Due  Topic Date Due  . HEMOGLOBIN A1C-at next visit with endocrinology 12/05/2018

## 2019-01-24 NOTE — Progress Notes (Signed)
Phone [phone]   Subjective:  Virtual visit via Video note. Chief complaint: Chief Complaint  Patient presents with  . Neck Pain    Sx started about 7 days ago. Tylenol and Motrin and heat pad  w/o relief. Pt denies doing anything that may have caused the pain.    This visit type was conducted due to national recommendations for restrictions regarding the COVID-19 Pandemic (e.g. social distancing).  This format is felt to be most appropriate for this patient at this time balancing risks to patient and risks to population by having him in for in person visit.  No physical exam was performed (except for noted visual exam or audio findings with Telehealth visits).    Our team/I connected with Wendy [name] on 01/24/19 at  2:20 PM EDT by a video enabled telemedicine application ([email or web address]) and verified that I am speaking with the correct person using two identifiers.  Location patient: Home[place] Location provider: [facility], office Persons participating in the virtual visit:  patient  Our team/I discussed the limitations of evaluation and management by telemedicine and the availability of in person appointments. In light of current covid-19 pandemic, patient also understands that we are trying to protect them by minimizing in office contact if at all possible.  The patient expressed consent for telemedicine visit and agreed to proceed. Patient understands insurance will be billed.   ROS-denies increased shortness of breath.  Complains of left neck pain going down into the shoulder and down into the arm.  Denies paresthesias or weakness in the arm.  Did have one-time fever last night when EMS came-she denies feeling febrile- has not had a fever outside of that measurement.  Past Medical History-  Patient Active Problem List   Diagnosis Date Noted  . Shortness of breath 03/14/2017    Priority: High  . Malignant neoplasm of upper-outer quadrant of left breast in female, estrogen receptor  positive ([place]) 11/20/2016    Priority: High  . 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 Diastolic dysfunction 92/10/69    Priority: High  . Depression, major, single episode, mild ([place]) 12/05/2018    Priority: Medium  . Rectal bleeding 01/17/2017    Priority: Medium  . CKD (chronic kidney disease) stage 3, GFR 30-59 ml/min (HCC) 03/17/2016    Priority: Medium  . Osteopenia 01/05/2016    Priority: Medium  . TOBACCO USE 04/13/2008    Priority: Medium  . Hyperlipidemia associated with type 2 diabetes mellitus ([place]) 11/29/2006    Priority: Medium  . OBESITY, BMI 30-35 11/29/2006    Priority: Medium  . Hypertension associated with diabetes ([place]) 11/29/2006    Priority: Medium  . Fatigue 03/14/2017    Priority: Low  . Edema 03/14/2017    Priority: Low  . Port catheter in place 01/18/2017    Priority: Low  . Constipation 01/17/2017    Priority: Low  . Seasonal allergies 12/01/2013    Priority: Low  . Grief 05/10/2018  . Anemia 01/09/2018    Medications- reviewed and updated Current Outpatient Medications  Medication Sig Dispense Refill  . amLODipine (NORVASC) 10 MG tablet TAKE 1 TABLET BY MOUTH EVERY DAY 90 tablet 1  . aspirin 81 MG tablet Take 81 mg by mouth daily.      . B-D INS SYRINGE 0.5CC/31GX5/16 31G X 5/16" 0.5 ML MISC USE AS DIRECTED 4 TIMES A DAY 300  each 1  . busPIRone (BUSPAR) 7.5 MG tablet Take 1 tablet (7.5 mg total) by mouth 3 (three) times daily. 270 tablet 1  . carvedilol (COREG) 25 MG tablet Take 1 tablet (25 mg total) by mouth 2 (two) times daily with a meal. 180 tablet 2  . cloNIDine (CATAPRES) 0.3 MG tablet Take 1 tablet (0.3 mg total) by mouth 2 (two) times daily. 180 tablet 2  . Insulin Glargine (LANTUS SOLOSTAR) 100 UNIT/ML Solostar Pen Inject 24 units into the skin 10 pen 5  . Insulin Pen Needle 31G X 5 MM MISC Use pen  needles for insulin injection daily 100 each 3  . insulin regular (NOVOLIN R,HUMULIN R) 100 units/mL injection Inject 0.1-0.16 mLs (10-16 Units total) into the skin 3 (three) times daily before meals. Relion. Please provide insulin syringes needed 20 mL 11  . Insulin Syringe-Needle U-100 (B-D INS SYRINGE 0.5CC/30GX1/2") 30G X 1/2" 0.5 ML MISC USE AS DIRECTED 4 TIMES A DAY 300 each 3  . letrozole (FEMARA) 2.5 MG tablet Take 1 tablet (2.5 mg total) by mouth daily. 90 tablet 1  . lisinopril-hydrochlorothiazide (PRINZIDE,ZESTORETIC) 10-12.5 MG tablet TAKE 1 TABLET BY MOUTH EVERY DAY 90 tablet 3  . lovastatin (MEVACOR) 40 MG tablet Take 1 tablet (40 mg total) by mouth at bedtime. 90 tablet 3  . metFORMIN (GLUCOPHAGE) 500 MG tablet TAKE 2 TABLETS BY MOUTH DAILY WITH SUPPER. 180 tablet 0  . Na Sulfate-K Sulfate-Mg Sulf 17.5-3.13-1.6 GM/177ML SOLN Take 1 kit by mouth as directed. 354 mL 0  . OVER THE COUNTER MEDICATION Take 1 tablet by mouth daily as needed. Stool softner for constipation, also drinks warm prune juice prn     No current facility-administered medications for this visit.      Objective:  BP (!) 146/86   Temp (!) 101 F (38.3 C) (Oral)   Ht 5' 2"  (1.575 m)   Wt 187 lb (84.8 kg)   LMP  (LMP Unknown)   BMI 34.20 kg/m  Gen: NAD, appears uncomfortable-grabs at her neck Lungs: nonlabored, normal respiratory rate  Skin: appears dry, no obvious rash particularly on the neck or shoulder MSK: Patient describes worsening pain into her neck and shoulder when she rotates her neck-also goes down the arm.  I got her to get up and walk around her house-no worsening pain when she was walking even when she tried to walk quickly.    Assessment and Plan   #Neck pain S:hurting from left neck and upper back all the way down into the left shoulder and then down into the arm. Does not feel short of breath. No chest pain. 2 weeks of symptoms. No rash or redness over the shoulder. Best position is laying  down- on back or laying on right side. Seems to go and come but can be severe. Can get worse with exertion- at the moment not having symptoms.   No cough/congestion. No body aches  Called paramedics last night because pain was so bad- EKG was normal- was told sinus rhythm. Temperature was 101 last night. She has not had a fever otherwise. Has not been out of house- low risk for covid 19. Only son and daughter in law in home helping.  A/P: Strongly suspect cervical radiculopathy-A1c somewhat elevated at 8.6 but due to severity of pain we will trial low-dose of prednisone.  Discussed that she needs to watch her sugars closely as well as diet and trying to remain active if it does not bother her shoulder/neck/arm -  She did initially states she has some exertional element to pain and we strongly considered sending her to the emergency room -Patient very fearful of going to the emergency room and wants to avoid it at all cost if possible due to COVID-19 - Ultimately since body of evidence more strongly points toward musculoskeletal source we thought it was reasonable to treat this.  Also she had a low risk stress test within 6 months.  Also if she were having unstable angina for 2 weeks would suspect more concerning outcome at this point. - if worsening symptoms or develops chest pain or SOB- to ER immediately-she is in agreement to this  #Hypertension-of note mild elevation in blood pressure today but patient is in intense pain and suspect that is because.  Prior blood pressures controlled-continue to monitor and continue current medications as above  #Diabetes-see discussion about prednisone above.  Has follow-up with Dr. 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 predniSONE (DELTASONE) 20 MG tablet    Sig: Take 1 tablet by mouth daily for 5 days, then 1/2 tablet daily for 2 days    Dispense:  6 tablet    Refill:  0    Return precautions advised.  Garret Reddish, MD

## 2019-01-28 ENCOUNTER — Ambulatory Visit: Payer: Medicare Other | Admitting: Family Medicine

## 2019-01-28 ENCOUNTER — Ambulatory Visit: Payer: Medicare Other | Admitting: Psychology

## 2019-01-29 NOTE — Telephone Encounter (Signed)
FYI

## 2019-01-29 NOTE — Telephone Encounter (Signed)
See note  Copied from [place] [id number]. Topic: General - Other >> Jan 29, 2019 10:06 AM Jodie Echevaria wrote: Reason for CRM: Patient called to inform Dr Yong Channel that she is feeling a little better. But she states that the pain doe come and go in her neck from time to time.  Ph# [phone]

## 2019-01-29 NOTE — Telephone Encounter (Signed)
FYI  Pt stated she would love a referral to sports medicine. Referral has been placed to see Dr.Smith. Pt was advise to call back if she does not hear anything in 3 weeks. Pt verbalized understanding No further action needed!

## 2019-01-29 NOTE — Telephone Encounter (Signed)
Would she like a referral to Dr. Tamala Julian of sports medicine for further evaluation?

## 2019-02-01 ENCOUNTER — Encounter: Payer: Self-pay | Admitting: Family Medicine

## 2019-02-03 ENCOUNTER — Ambulatory Visit (INDEPENDENT_AMBULATORY_CARE_PROVIDER_SITE_OTHER): Payer: Medicare Other | Admitting: Family Medicine

## 2019-02-03 ENCOUNTER — Encounter: Payer: Self-pay | Admitting: Family Medicine

## 2019-02-03 VITALS — Temp 97.2°F | Ht 62.0 in

## 2019-02-03 DIAGNOSIS — M542 Cervicalgia: Secondary | ICD-10-CM

## 2019-02-03 DIAGNOSIS — M5412 Radiculopathy, cervical region: Secondary | ICD-10-CM

## 2019-02-03 MED ORDER — TRAMADOL HCL 50 MG PO TABS: 50.0000 mg | ORAL_TABLET | Freq: Four times a day (QID) | ORAL | 0 refills | Status: DC | PRN

## 2019-02-03 NOTE — Progress Notes (Signed)
Phone [phone]   Subjective:  Virtual visit via Video note. Chief complaint: Chief Complaint  Patient presents with  . Follow-up  . Neck Pain   This visit type was conducted due to national recommendations for restrictions regarding the COVID-19 Pandemic (e.g. social distancing).  This format is felt to be most appropriate for this patient at this time balancing risks to patient and risks to population by having him in for in person visit.  No physical exam was performed (except for noted visual exam or audio findings with Telehealth visits).    Our team/I connected with Wendy [name] on 02/03/19 at  4:40 PM EDT by a video enabled telemedicine application (doxy.me) and verified that I am speaking with the correct person using two identifiers.  Location patient: Home-[place] Location provider: [facility], office Persons participating in the virtual visit:  patient  Our team/I discussed the limitations of evaluation and management by telemedicine and the availability of in person appointments. In light of current covid-19 pandemic, patient also understands that we are trying to protect them by minimizing in office contact if at all possible.  The patient expressed consent for telemedicine visit and agreed to proceed. Patient understands insurance will be billed.   ROS-no chest pain or shortness of breath reported.  Denies weakness in the left arm.  No fever or vomiting.  No abnormal sweating.  Past Medical History-  Patient Active Problem List   Diagnosis Date Noted  . Shortness of breath 03/14/2017    Priority: High  . Malignant neoplasm of upper-outer quadrant of left breast in female, estrogen receptor positive ([place]) 11/20/2016    Priority: High  . 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 Diastolic dysfunction 70/35/0093    Priority: High  . Depression,  major, single episode, mild ([place]) 12/05/2018    Priority: Medium  . Rectal bleeding 01/17/2017    Priority: Medium  . CKD (chronic kidney disease) stage 3, GFR 30-59 ml/min (HCC) 03/17/2016    Priority: Medium  . Osteopenia 01/05/2016    Priority: Medium  . TOBACCO USE 04/13/2008    Priority: Medium  . Hyperlipidemia associated with type 2 diabetes mellitus ([place]) 11/29/2006    Priority: Medium  . OBESITY, BMI 30-35 11/29/2006    Priority: Medium  . Hypertension associated with diabetes ([place]) 11/29/2006    Priority: Medium  . Fatigue 03/14/2017    Priority: Low  . Edema 03/14/2017    Priority: Low  . Port catheter in place 01/18/2017    Priority: Low  . Constipation 01/17/2017    Priority: Low  . Seasonal allergies 12/01/2013    Priority: Low  . Grief 05/10/2018  . Anemia 01/09/2018    Medications- reviewed and updated Current Outpatient Medications  Medication Sig Dispense Refill  . amLODipine (NORVASC) 10 MG tablet TAKE 1 TABLET BY MOUTH EVERY DAY 90 tablet 1  . aspirin 81 MG tablet Take 81 mg by mouth daily.      . B-D INS SYRINGE 0.5CC/31GX5/16 31G X 5/16" 0.5 ML MISC USE AS DIRECTED 4 TIMES A DAY 300 each 1  . busPIRone (BUSPAR) 7.5 MG tablet Take 1 tablet (7.5 mg total) by mouth 3 (three) times daily. 270 tablet 1  . carvedilol (COREG) 25 MG tablet Take 1 tablet (25 mg total) by mouth 2 (two) times daily with a meal. 180 tablet 2  . cloNIDine (CATAPRES)  0.3 MG tablet Take 1 tablet (0.3 mg total) by mouth 2 (two) times daily. 180 tablet 2  . Insulin Glargine (LANTUS SOLOSTAR) 100 UNIT/ML Solostar Pen Inject 24 units into the skin 10 pen 5  . Insulin Pen Needle 31G X 5 MM MISC Use pen needles for insulin injection daily 100 each 3  . insulin regular (NOVOLIN R,HUMULIN R) 100 units/mL injection Inject 0.1-0.16 mLs (10-16 Units total) into the skin 3 (three) times daily before meals. Relion. Please provide insulin syringes needed 20 mL 11  . Insulin Syringe-Needle U-100  (B-D INS SYRINGE 0.5CC/30GX1/2") 30G X 1/2" 0.5 ML MISC USE AS DIRECTED 4 TIMES A DAY 300 each 3  . letrozole (FEMARA) 2.5 MG tablet Take 1 tablet (2.5 mg total) by mouth daily. 90 tablet 1  . lisinopril-hydrochlorothiazide (PRINZIDE,ZESTORETIC) 10-12.5 MG tablet TAKE 1 TABLET BY MOUTH EVERY DAY 90 tablet 3  . lovastatin (MEVACOR) 40 MG tablet Take 1 tablet (40 mg total) by mouth at bedtime. 90 tablet 3  . metFORMIN (GLUCOPHAGE) 500 MG tablet TAKE 2 TABLETS BY MOUTH DAILY WITH SUPPER. 180 tablet 0  . Na Sulfate-K Sulfate-Mg Sulf 17.5-3.13-1.6 GM/177ML SOLN Take 1 kit by mouth as directed. 354 mL 0  . OVER THE COUNTER MEDICATION Take 1 tablet by mouth daily as needed. Stool softner for constipation, also drinks warm prune juice prn    . predniSONE (DELTASONE) 20 MG tablet Take 1 tablet by mouth daily for 5 days, then 1/2 tablet daily for 2 days 6 tablet 0   No current facility-administered medications for this visit.      Objective:  Temp (!) 97.2 F (36.2 C) (Oral)   Ht 5' 2"  (1.575 m)   LMP  (LMP Unknown)   BMI 34.20 kg/m  Gen: NAD, resting comfortably Lungs: nonlabored, normal respiratory rate  Skin: appears dry, no obvious rash When patient bends her neck to the left-she reports worsening pain in her neck going down into her hand     Assessment and Plan   # Neck pain on left side - Plan: Ambulatory referral to Orthopedic Surgery  Cervical radiculopathy - Plan: Ambulatory referral to Orthopedic Surgery S: She continues to have neck pain- perhaps 10% better with prednisone- feels like cant lift her left arm up high- can hurt down into hand. Worst in the morning and night. Pain from neck all the way down to her hand.  Worst with leaning over to the left on the couch and neck leaning to the left. Denies numbness/tingling.  Ibuprofen and Tylenol are somewhat helpful-she knows she needs to avoid ibuprofen given CKD and potential cardiac risks.No chest pain or shortness of breath. Not  clearly exertional.  A/P: 78 year old female with neck pain on the left side radiating down into her left arm-worsened by different neck positions.  She had a reassuring stress test in December as well and no clear exertional element to this-doubt cardiovascular cause.  Only mild improvement on prednisone.  We will refer her to [place] per her request.  I will give her some tramadol to try to help with pain. -[facility]  Future Appointments  Date Time Provider [place]  02/13/2019 10:40 AM [facility] DIAG TOMO 1 [facility] GI-BREAST CE  03/18/2019 11:00 AM Marin Olp, MD [facility] PEC  03/24/2019  9:00 AM [facility] LAB 5 [facility] None  03/24/2019  9:30 AM [name], MD [facility] None  04/07/2019 10:40 AM Marin Olp, MD [facility] PEC  04/10/2019  8:00 AM  Philemon Kingdom, MD [facility] None   No follow-ups on file.  Lab/Order associations: Neck pain on left side - Plan: Ambulatory referral to Orthopedic Surgery  Cervical radiculopathy - Plan: Ambulatory referral to Orthopedic Surgery  Meds ordered this encounter  Medications  . traMADol (ULTRAM) 50 MG tablet    Sig: Take 1 tablet (50 mg total) by mouth every 6 (six) hours as needed for moderate pain or severe pain (neck and shoulder pain).    Dispense:  20 tablet    Refill:  0   Return precautions advised.  Garret Reddish, MD

## 2019-02-03 NOTE — Patient Instructions (Addendum)
Health Maintenance Due  Topic Date Due  . HEMOGLOBIN A1C  Pt is scheduled with Dr. Cruzita Lederer in [place] will keep this appointment-we canceled lab visit that have been scheduled here for 12 May 12/05/2018   Depression screen [facility] 2/9 12/05/2018 10/15/2018 09/12/2018  Decreased Interest 1 0 0  Down, Depressed, Hopeless 1 0 1  PHQ - 2 Score 2 0 1  Altered sleeping 1 - -  Tired, decreased energy 2 - -  Change in appetite 1 - -  Feeling bad or failure about yourself  2 - -  Trouble concentrating 0 - -  Moving slowly or fidgety/restless 0 - -  Suicidal thoughts 0 - -  PHQ-9 Score 8 - -  Difficult doing work/chores Not difficult at all - -  Some recent data might be hidden   Video visit

## 2019-02-06 ENCOUNTER — Encounter (HOSPITAL_COMMUNITY): Payer: Self-pay

## 2019-02-06 ENCOUNTER — Emergency Department (HOSPITAL_COMMUNITY)
Admission: EM | Admit: 2019-02-06 | Discharge: 2019-02-06 | Disposition: A | Discharge status: Home / Self Care | Payer: Medicare Other | Attending: Emergency Medicine | Admitting: Emergency Medicine

## 2019-02-06 ENCOUNTER — Emergency Department (HOSPITAL_COMMUNITY): Payer: Medicare Other

## 2019-02-06 ENCOUNTER — Other Ambulatory Visit: Payer: Self-pay

## 2019-02-06 DIAGNOSIS — Z87891 Personal history of nicotine dependence: Secondary | ICD-10-CM | POA: Insufficient documentation

## 2019-02-06 DIAGNOSIS — Z79899 Other long term (current) drug therapy: Secondary | ICD-10-CM | POA: Insufficient documentation

## 2019-02-06 DIAGNOSIS — M542 Cervicalgia: Secondary | ICD-10-CM | POA: Insufficient documentation

## 2019-02-06 DIAGNOSIS — Z853 Personal history of malignant neoplasm of breast: Secondary | ICD-10-CM | POA: Diagnosis not present

## 2019-02-06 DIAGNOSIS — Z794 Long term (current) use of insulin: Secondary | ICD-10-CM | POA: Diagnosis not present

## 2019-02-06 DIAGNOSIS — M25512 Pain in left shoulder: Secondary | ICD-10-CM | POA: Insufficient documentation

## 2019-02-06 DIAGNOSIS — Z7982 Long term (current) use of aspirin: Secondary | ICD-10-CM | POA: Insufficient documentation

## 2019-02-06 DIAGNOSIS — E119 Type 2 diabetes mellitus without complications: Secondary | ICD-10-CM | POA: Insufficient documentation

## 2019-02-06 DIAGNOSIS — I10 Essential (primary) hypertension: Secondary | ICD-10-CM | POA: Insufficient documentation

## 2019-02-06 LAB — BASIC METABOLIC PANEL
Anion gap: 14 (ref 5–15)
BUN: 13 mg/dL (ref 8–23)
CO2: 21 mmol/L — ABNORMAL LOW (ref 22–32)
Calcium: 9.3 mg/dL (ref 8.9–10.3)
Chloride: 102 mmol/L (ref 98–111)
Creatinine, Ser: 1.01 mg/dL — ABNORMAL HIGH (ref 0.44–1.00)
GFR calc Af Amer: >60 mL/min (ref >60)
GFR calc non Af Amer: 54 mL/min — ABNORMAL LOW (ref >60)
Glucose, Bld: 276 mg/dL — ABNORMAL HIGH (ref 70–99)
Potassium: 4.6 mmol/L (ref 3.5–5.1)
Sodium: 137 mmol/L (ref 135–145)

## 2019-02-06 LAB — CBC WITH DIFFERENTIAL/PLATELET
Abs Immature Granulocytes: 0.06 10*3/uL (ref 0.00–0.07)
Basophils Absolute: 0 10*3/uL (ref 0.0–0.1)
Basophils Relative: 0 %
Eosinophils Absolute: 0.1 10*3/uL (ref 0.0–0.5)
Eosinophils Relative: 2 %
HCT: 36.6 % (ref 36.0–46.0)
Hemoglobin: 12.2 g/dL (ref 12.0–15.0)
Immature Granulocytes: 1 %
Lymphocytes Relative: 15 %
Lymphs Abs: 1.2 10*3/uL (ref 0.7–4.0)
MCH: 28.2 pg (ref 26.0–34.0)
MCHC: 33.3 g/dL (ref 30.0–36.0)
MCV: 84.5 fL (ref 80.0–100.0)
Monocytes Absolute: 0.5 10*3/uL (ref 0.1–1.0)
Monocytes Relative: 7 %
Neutro Abs: 6.2 10*3/uL (ref 1.7–7.7)
Neutrophils Relative %: 75 %
Platelets: 249 10*3/uL (ref 150–400)
RBC: 4.33 MIL/uL (ref 3.87–5.11)
RDW: 15.7 % — ABNORMAL HIGH (ref 11.5–15.5)
WBC: 8.2 10*3/uL (ref 4.0–10.5)
nRBC: 0 % (ref 0.0–0.2)

## 2019-02-06 LAB — TROPONIN I: Troponin I: <0.03 ng/mL (ref <0.03)

## 2019-02-06 NOTE — ED Notes (Signed)
Water given to patient 

## 2019-02-06 NOTE — Discharge Instructions (Signed)
You were seen in the ED today for left shoulder pain; your x ray was negative today without any signs of bony abnormalities. Please follow up with your PCP regarding your orthopedic referral. Continue taking Tramadol as needed for pain/discuss another option with your PCP. Return to the ED for any worsening pain, chest pain, shortness of breath.

## 2019-02-06 NOTE — ED Provider Notes (Signed)
[place] EMERGENCY DEPARTMENT Provider Note   CSN: [id number] Arrival date & time: 02/06/19  5852    History   Chief Complaint Chief Complaint  Patient presents with  . Shoulder Pain    HPI Wendy [name] is a 78 y.o. female with PMHx HTN and DM who presents to the ED complaining of sudden onset, intermittent, atraumatic, achy, left neck and left shoulder pain x 3 weeks. Pt reports she woke up one day and noticed the pain; the pain is mostly present at nighttime when she is trying to sleep or when she wakes up in the morning. She is not having any pain currently but had some this morning prompting her ED visit. She had a telemedicine visit with her PCP on 04/23 for same and was thought to be due to cervical radiculopathy and pt was prescribed low dose of Prednisone. PCP was initially concerned about unstable angina although pt denied any pain with exertion and given length of time PCP thought pain was more due to cervical radiculopathy. She was seen again via telemedicine on 05/04 for continued pain; given Tramadol as well as referral to orthopedist. Pt comes to the ED today for continued pain despite Tramadol; she states her PCP told her she could come to get x rays if no improvement. No worsening of symptoms. Denies chest pain, shortness of breath, nausea, vomiting, weakness or numbness unilaterally, fever, neck stiffness, headache, or any other associated symptoms. Last stress test 09/26/18 which was normal.         Past Medical History:  Diagnosis Date  . Abnormal breast finding 2018   per pt/ having  a lot drainage from left breast nipple  . Breast cancer ([place]) 11/2016   left/  . GERD (gastroesophageal reflux disease)    TUMS as needed  . HTN (hypertension)    states BP has been high recently; has been on med. x 20 yr.  . Hyperlipidemia   . HYPERTENSION, BENIGN SYSTEMIC 11/29/2006   Lisinopril hctz 10-12.50m, amlodipine 175m coreg 2527mID, clonidine 0.3 mg  BID.   . IMarland Kitchensulin dependent diabetes mellitus ([place] . Personal history of chemotherapy    2018/finished 6 weeks of chemo in Sep 2018  . Personal history of radiation therapy    2018 left breast/finished radiation in Sept 2018 per pt.    Patient Active Problem List   Diagnosis Date Noted  . Depression, major, single episode, mild ([place]CC[place]3/01/2019  . Grief 05/10/2018  . Anemia 01/09/2018  . Fatigue 03/14/2017  . Edema 03/14/2017  . Shortness of breath 03/14/2017  . Port catheter in place 01/18/2017  . Rectal bleeding 01/17/2017  . Constipation 01/17/2017  . Malignant neoplasm of upper-outer quadrant of left breast in female, estrogen receptor positive ([place]CC[place]2/19/2018  . CKD (chronic kidney disease) stage 3, GFR 30-59 ml/min (HCC) 03/17/2016  . Osteopenia 01/05/2016  . 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 Seasonal allergies 12/01/2013  . Diastolic dysfunction 10/77/82/4235 TOBACCO USE 04/13/2008  . Hyperlipidemia associated with type 2 diabetes mellitus ([facility]CC[place]2/28/2008  . OBESITY, BMI 30-35 11/29/2006  . Hypertension associated with diabetes ([place]CC[place]ag[place]2/28/2008    Past Surgical History:  Procedure Laterality Date  . BREAST LUMPECTOMY Left 05/05/2008  . BREAST LUMPECTOMY Left 12/07/2016   malignant  . BREAST LUMPECTOMY WITH RADIOACTIVE SEED AND SENTINEL LYMPH NODE BIOPSY Left 12/07/2016   Procedure: LEFT  BREAST LUMPECTOMY WITH RADIOACTIVE SEED AND SENTINEL LYMPH NODE BIOPSY;  Surgeon: Erroll Luna, MD;  Location: [facility];  Service: General;  Laterality: Left;  . IR FLUORO GUIDE PORT INSERTION RIGHT  01/04/2017  . IR US GUIDE VASC ACCESS RIGHT  01/04/2017  . PORT-A-CATH REMOVAL N/A 05/30/2017   Procedure: REMOVAL PORT-A-CATH;  Surgeon: Erroll Luna, MD;  Location: [facility];  Service: General;  Laterality: N/A;  . RE-EXCISION OF  BREAST LUMPECTOMY Left 12/28/2016   Procedure: RE-EXCISION OF BREAST LUMPECTOMY;  Surgeon: Erroll Luna, MD;  Location: [facility];  Service: General;  Laterality: Left;     OB History   No obstetric history on file.      Home Medications    Prior to Admission medications   Medication Sig Start Date End Date Taking? Authorizing Provider  amLODipine (NORVASC) 10 MG tablet TAKE 1 TABLET BY MOUTH EVERY DAY 09/23/18   Marin Olp, MD  aspirin 81 MG tablet Take 81 mg by mouth daily.      [provider]  B-D INS SYRINGE 0.5CC/31GX5/16 31G X 5/16" 0.5 ML MISC USE AS DIRECTED 4 TIMES A DAY 08/18/16   Philemon Kingdom, MD  busPIRone (BUSPAR) 7.5 MG tablet Take 1 tablet (7.5 mg total) by mouth 3 (three) times daily. 12/05/18   Marin Olp, MD  carvedilol (COREG) 25 MG tablet Take 1 tablet (25 mg total) by mouth 2 (two) times daily with a meal. 06/28/18   Marin Olp, MD  cloNIDine (CATAPRES) 0.3 MG tablet Take 1 tablet (0.3 mg total) by mouth 2 (two) times daily. 02/07/18   Marin Olp, MD  Insulin Glargine (LANTUS SOLOSTAR) 100 UNIT/ML Solostar Pen Inject 24 units into the skin 12/28/17   Marin Olp, MD  Insulin Pen Needle 31G X 5 MM MISC Use pen needles for insulin injection daily 12/12/16   Philemon Kingdom, MD  insulin regular (NOVOLIN R,HUMULIN R) 100 units/mL injection Inject 0.1-0.16 mLs (10-16 Units total) into the skin 3 (three) times daily before meals. Relion. Please provide insulin syringes needed 05/30/18   Philemon Kingdom, MD  Insulin Syringe-Needle U-100 (B-D INS SYRINGE 0.5CC/30GX1/2") 30G X 1/2" 0.5 ML MISC USE AS DIRECTED 4 TIMES A DAY 08/15/16   Philemon Kingdom, MD  letrozole [facility]) 2.5 MG tablet Take 1 tablet (2.5 mg total) by mouth daily. 07/11/18   [name], MD  lisinopril-hydrochlorothiazide (PRINZIDE,ZESTORETIC) 10-12.5 MG tablet TAKE 1 TABLET BY MOUTH EVERY DAY 07/15/18   Marin Olp, MD  lovastatin (MEVACOR) 40 MG tablet Take 1  tablet (40 mg total) by mouth at bedtime. 08/08/18   Marin Olp, MD  metFORMIN (GLUCOPHAGE) 500 MG tablet TAKE 2 TABLETS BY MOUTH DAILY WITH SUPPER. 12/27/18   Philemon Kingdom, MD  Na Sulfate-K Sulfate-Mg Sulf 17.5-3.13-1.6 GM/177ML SOLN Take 1 kit by mouth as directed. 09/05/17   Levin Erp, PA  OVER THE COUNTER MEDICATION Take 1 tablet by mouth daily as needed. Stool softner for constipation, also drinks warm prune juice prn    [provider]  traMADol (ULTRAM) 50 MG tablet Take 1 tablet (50 mg total) by mouth every 6 (six) hours as needed for moderate pain or severe pain (neck and shoulder pain). 02/03/19   Marin Olp, MD    Family History Family History  Problem Relation Age of Onset  . Diabetes Sister        x 4  . Heart failure Sister   . Diabetes  Brother   . Heart disease Brother   . Diabetes Paternal Grandmother   . Diabetes Paternal Aunt   . Heart attack Brother   . Arthritis Sister   . Hypertension Sister   . Diabetes Sister   . Emphysema Brother   . Cancer Father 68       brain tumor   . Diabetes Child   . Asthma Child   . Hypertension Child   . Diabetes Child   . Diabetes Child   . Colon cancer Neg Hx   . Stomach cancer Neg Hx     Social History Social History   Tobacco Use  . Smoking status: Former Smoker    Types: Cigarettes  . Smokeless tobacco: Former Systems developer    Types: Snuff    Quit date: 11/30/2017  . Tobacco comment: states she is determined to quit   Substance Use Topics  . Alcohol use: No    Alcohol/week: 0.0 standard drinks  . Drug use: No     Allergies   Patient has no known allergies.   Review of Systems Review of Systems  Constitutional: Negative for chills and fever.  HENT: Negative for congestion.   Eyes: Negative for visual disturbance.  Respiratory: Negative for cough and shortness of breath.   Cardiovascular: Negative for chest pain.  Gastrointestinal: Negative for abdominal pain, nausea and  vomiting.  Musculoskeletal: Positive for arthralgias. Negative for joint swelling, neck pain and neck stiffness.  Skin: Negative for rash.  Allergic/Immunologic: Positive for immunocompromised state.  Neurological: Negative for syncope, speech difficulty, weakness, numbness and headaches.     Physical Exam Updated Vital Signs BP (!) 146/59   Pulse 63   Temp 98.9 F (37.2 C) (Oral)   Resp 13   Ht 5' 2"  (1.575 m)   Wt 85.7 kg   LMP  (LMP Unknown)   SpO2 100%   BMI 34.57 kg/m   Physical Exam Vitals signs and nursing note reviewed.  Constitutional:      Appearance: She is not ill-appearing.  HENT:     Head: Normocephalic and atraumatic.  Eyes:     Extraocular Movements: Extraocular movements intact.     Conjunctiva/sclera: Conjunctivae normal.     Pupils: Pupils are equal, round, and reactive to light.  Neck:     Musculoskeletal: Neck supple.  Cardiovascular:     Rate and Rhythm: Normal rate and regular rhythm.     Pulses: Normal pulses.     Heart sounds: No murmur.  Pulmonary:     Effort: Pulmonary effort is normal.     Breath sounds: Normal breath sounds.  Abdominal:     Palpations: Abdomen is soft.     Tenderness: There is no abdominal tenderness.  Musculoskeletal:     Comments: No C, T, or L spine midline tenderness to palpation. Mild tenderness to left trapezius muscle into left glenohumeral joint. Active ROM limited due to pain; full passive ROM without issue. Strength 5/5 in BUEs. Sensation intact throughout. No tenderness to left elbow or left wrist. 2+ radial pulses.   Skin:    General: Skin is warm and dry.  Neurological:     General: No focal deficit present.     Mental Status: She is alert and oriented to person, place, and time.     Cranial Nerves: No cranial nerve deficit.     Sensory: No sensory deficit.     Motor: No weakness.      ED Treatments / Results  Labs (all labs  ordered are listed, but only abnormal results are displayed) Labs Reviewed   BASIC METABOLIC PANEL - Abnormal; Notable for the following components:      Result Value   CO2 21 (*)    Glucose, Bld 276 (*)    Creatinine, Ser 1.01 (*)    GFR calc non Af Amer 54 (*)    All other components within normal limits  CBC WITH DIFFERENTIAL/PLATELET - Abnormal; Notable for the following components:   RDW 15.7 (*)    All other components within normal limits  TROPONIN I    EKG EKG Interpretation  Date/Time:  Thursday Feb 06 2019 11:00:57 EDT Ventricular Rate:  71 PR Interval:    QRS Duration: 88 QT Interval:  387 QTC Calculation: 421 R Axis:   37 Text Interpretation:  Sinus rhythm Low voltage, precordial leads LVH by voltage ST changes inferiorly likely baseline wander related.  No STEMI.  Confirmed by Nanda Quinton [id number]) on 02/06/2019 11:09:14 AM Also confirmed by Nanda Quinton [phone]), editor Philomena Doheny [phone])  on 02/06/2019 11:34:55 AM   Radiology Dg Chest 2 View  Result Date: 02/06/2019 CLINICAL DATA:  Left shoulder pain with exertion EXAM: CHEST - 2 VIEW COMPARISON:  CTA chest dated 08/15/2018 FINDINGS: Lungs are clear.  No pleural effusion or pneumothorax. The heart is normal in size. Degenerative changes of the visualized thoracolumbar spine. IMPRESSION: Normal chest radiographs. Electronically Signed   By: Julian Hy M.D.   On: 02/06/2019 11:48   Dg Shoulder Left  Result Date: 02/06/2019 CLINICAL DATA:  [place] 5 months ago been hurting since than but now she cant move that lt arm ,,pain top /lat lt shoulder left shoulder pain EXAM: LEFT SHOULDER - 2+ VIEW COMPARISON:  None. FINDINGS: Glenohumeral joint is intact. No evidence of scapular fracture or humeral fracture. The acromioclavicular joint is intact. IMPRESSION: No fracture or dislocation. Electronically Signed   By: Suzy Bouchard M.D.   On: 02/06/2019 09:37    Procedures Procedures (including critical care time)  Medications Ordered in ED Medications - No data to display   Initial Impression  / Assessment and Plan / ED Course  I have reviewed the triage vital signs and the nursing notes.  Pertinent labs & imaging results that were available during my care of the patient were reviewed by me and considered in my medical decision making (see chart for details).    Pt is a 78 year old female who presents with 3 weeks of intermittent left sided neck and left shoulder pain. No recent trauma to the shoulder. Has been seen by PCP twice via telemedicine; thought to be cervical radiculopathy; currently in the process of getting set up with ortho. Has been on both Prednisone and Tramadol without relief. Not currently having any pain; pain is not exertional; worse when she is trying to sleep or right after she wakes up. No concerning symptoms including chest pain, shortness of breath, weakness or numbness, nausea, vomiting. Last stress test about 6 months ago without issue. Low suspicion for ACS at this time given length of symptoms and non exertional pain. Will get x ray of shoulder in the ED although do not suspect any abnormalities given atraumatic in nature; only has mild tenderness with palpation to left shoulder. Will reevaluate once imaging returns.   Xray negative at this time; no acute bony abnormalities. Will ambulate patient in the ED to ensure she is not having any exertional pain.   Tech ambulated patient; patient complaining of an achy  sensation to her left shoulder after ambulation. Further divulges that she has pain at times especially with moderate exertion specifically while cleaning her house/doing the dishes. Pt denied this initially but given she had returning pain with ambulation feel she needs a further workup at this time to rule out ACS. Troponin, CBC, BMP, EKG, and CXR ordered.   Troponin negative. EKG without ischemic changes. All other bloodwork and imaging negative at this time. Do not feel patient needs repeat trop given > 3 weeks of pain. Will have patient follow up with  PCP to check on ortho referral as this is most likely musculoskeletal in nature. Pt in agreement with plan and stable for discharge.        Final Clinical Impressions(s) / ED Diagnoses   Final diagnoses:  Acute pain of left shoulder    ED Discharge Orders    None       Eustaquio Maize, PA-C 02/06/19 1625    Gareth Morgan, MD 02/07/19 1053

## 2019-02-06 NOTE — ED Notes (Signed)
ED Provider at bedside. 

## 2019-02-06 NOTE — ED Notes (Signed)
Pt ambulated to and from the bathroom with a steady gait. Pt stated that she did not feel dizzy or light headed while ambualating.

## 2019-02-06 NOTE — ED Triage Notes (Addendum)
Pt arrived with c/o L shoulder pain, radiating from neck down her LA x3 wks; pt states that the pain also goes to her back to her shoulder blade. Pt denies CP; abd pain. Pt states that she had a tele visit with her PCP on Mon and he prescribed Tramadol; pt states it is ineffective and she has tried ibuprofen and tylenol as well without relief.

## 2019-02-11 ENCOUNTER — Ambulatory Visit: Payer: Medicare Other | Admitting: Psychology

## 2019-02-11 ENCOUNTER — Other Ambulatory Visit: Payer: Medicare Other

## 2019-02-16 ENCOUNTER — Other Ambulatory Visit: Payer: Self-pay | Admitting: Family Medicine

## 2019-02-16 DIAGNOSIS — I10 Essential (primary) hypertension: Secondary | ICD-10-CM

## 2019-02-17 NOTE — Telephone Encounter (Signed)
Last OV 02/03/19 Last refill 02/07/18 #180/2 Next OV 03/18/19

## 2019-02-21 DIAGNOSIS — M5412 Radiculopathy, cervical region: Secondary | ICD-10-CM | POA: Diagnosis not present

## 2019-02-21 DIAGNOSIS — M542 Cervicalgia: Secondary | ICD-10-CM | POA: Diagnosis not present

## 2019-02-25 ENCOUNTER — Ambulatory Visit: Payer: Medicare Other | Admitting: Psychology

## 2019-02-27 ENCOUNTER — Encounter: Payer: Self-pay | Admitting: Internal Medicine

## 2019-03-03 ENCOUNTER — Telehealth: Payer: Self-pay | Admitting: Internal Medicine

## 2019-03-03 NOTE — Telephone Encounter (Signed)
Forms have been faxed 

## 2019-03-03 NOTE — Telephone Encounter (Signed)
Patient's daughter-in-law Wendy [name] dropped off Wendy [name] and requests that she be called at Ph# [phone] when forms are completed. Forms placed in Dr. Arman Filter assistant box.

## 2019-03-06 NOTE — Telephone Encounter (Signed)
Left message for [name] that this has been done and to let me know if they would like a copy of the papers.

## 2019-03-17 NOTE — Telephone Encounter (Signed)
Patient called to see if we have heard from [facility]. Patient also asked to see if have any samples of Insulin Glargine (LANTUS SOLOSTAR) 100 UNIT/ML Solostar Pen she will be out of it tomorrow.  Please Advise, Thanks

## 2019-03-18 ENCOUNTER — Encounter: Payer: Self-pay | Admitting: Family Medicine

## 2019-03-18 ENCOUNTER — Telehealth: Payer: Self-pay | Admitting: Internal Medicine

## 2019-03-18 ENCOUNTER — Ambulatory Visit (INDEPENDENT_AMBULATORY_CARE_PROVIDER_SITE_OTHER): Payer: Medicare Other | Admitting: Family Medicine

## 2019-03-18 VITALS — Ht 62.0 in | Wt 189.0 lb

## 2019-03-18 DIAGNOSIS — F320 Major depressive disorder, single episode, mild: Secondary | ICD-10-CM

## 2019-03-18 DIAGNOSIS — E785 Hyperlipidemia, unspecified: Secondary | ICD-10-CM | POA: Diagnosis not present

## 2019-03-18 DIAGNOSIS — I10 Essential (primary) hypertension: Secondary | ICD-10-CM

## 2019-03-18 DIAGNOSIS — Z170 Estrogen receptor positive status [ER+]: Secondary | ICD-10-CM

## 2019-03-18 DIAGNOSIS — N183 Chronic kidney disease, stage 3 unspecified: Secondary | ICD-10-CM

## 2019-03-18 DIAGNOSIS — C50412 Malignant neoplasm of upper-outer quadrant of left female breast: Secondary | ICD-10-CM

## 2019-03-18 DIAGNOSIS — E1169 Type 2 diabetes mellitus with other specified complication: Secondary | ICD-10-CM

## 2019-03-18 DIAGNOSIS — E1159 Type 2 diabetes mellitus with other circulatory complications: Secondary | ICD-10-CM | POA: Diagnosis not present

## 2019-03-18 NOTE — Telephone Encounter (Signed)
I have not received anything from [facility].  We do have some samples for the patient, I have labeled those and notified patient she can pick them up.

## 2019-03-18 NOTE — Progress Notes (Signed)
Phone [phone]   Subjective:  Virtual visit via Video note. Chief complaint: Chief Complaint  Patient presents with  . Follow-up  . Depression    follow up  . Hypertension    Follow up    This visit type was conducted due to national recommendations for restrictions regarding the COVID-19 Pandemic (e.g. social distancing).  This format is felt to be most appropriate for this patient at this time balancing risks to patient and risks to population by having him in for in person visit.  No physical exam was performed (except for noted visual exam or audio findings with Telehealth visits).    Our team/I connected with [name] at 11:00 AM EDT by a video enabled telemedicine application ([email or web address] or caregility through epic) and verified that I am speaking with the correct person using two identifiers.  Location patient: [facility] Location provider: [facility], office Persons participating in the virtual visit:  patient  Our team/I discussed the limitations of evaluation and management by telemedicine and the availability of in person appointments. In light of current covid-19 pandemic, patient also understands that we are trying to protect them by minimizing in office contact if at all possible.  The patient expressed consent for telemedicine visit and agreed to proceed. Patient understands insurance will be billed.   ROS- some constipation. No fever/chills/nausea/vomiting. Left shoulder/neck pain is better   For lab screen- No fever, chills, cough, shortness of breath, body aches, sore throat, or loss of taste or smell. No known contacts with covid 19.   Past Medical History-  Patient Active Problem List   Diagnosis Date Noted  . Shortness of breath 03/14/2017    Priority: High  . Malignant neoplasm of upper-outer quadrant of left breast in female, estrogen receptor positive ([place]) 11/20/2016    Priority: High  . Uncontrolled type 2 diabetes mellitus with severe nonproliferative  retinopathy and macular edema, with long-term current use of insulin ([place]) 11/02/2015    Priority: High  . Diastolic dysfunction 95/18/8416    Priority: High  . Depression, major, single episode, mild ([place]) 12/05/2018    Priority: Medium  . Rectal bleeding 01/17/2017    Priority: Medium  . CKD (chronic kidney disease) stage 3, GFR 30-59 ml/min (HCC) 03/17/2016    Priority: Medium  . Osteopenia 01/05/2016    Priority: Medium  . TOBACCO USE 04/13/2008    Priority: Medium  . Hyperlipidemia associated with type 2 diabetes mellitus ([place]) 11/29/2006    Priority: Medium  . OBESITY, BMI 30-35 11/29/2006    Priority: Medium  . Hypertension associated with diabetes ([place]) 11/29/2006    Priority: Medium  . Fatigue 03/14/2017    Priority: Low  . Edema 03/14/2017    Priority: Low  . Port catheter in place 01/18/2017    Priority: Low  . Constipation 01/17/2017    Priority: Low  . Seasonal allergies 12/01/2013    Priority: Low  . Grief 05/10/2018  . Anemia 01/09/2018    Medications- reviewed and updated Current Outpatient Medications  Medication Sig Dispense Refill  . amLODipine (NORVASC) 10 MG tablet TAKE 1 TABLET BY MOUTH EVERY DAY 90 tablet 1  . aspirin 81 MG tablet Take 81 mg by mouth daily.      . B-D INS SYRINGE 0.5CC/31GX5/16 31G X 5/16" 0.5 ML MISC USE AS DIRECTED 4 TIMES A DAY 300 each 1  . busPIRone (BUSPAR) 7.5 MG tablet Take 1 tablet (7.5 mg total) by mouth 3 (three) times daily. 270 tablet 1  .  carvedilol (COREG) 25 MG tablet Take 1 tablet (25 mg total) by mouth 2 (two) times daily with a meal. 180 tablet 2  . cloNIDine (CATAPRES) 0.3 MG tablet TAKE 1 TABLET (0.3 MG TOTAL) BY MOUTH 2 (TWO) TIMES DAILY. 180 tablet 1  . Insulin Glargine (LANTUS SOLOSTAR) 100 UNIT/ML Solostar Pen Inject 24 units into the skin 10 pen 5  . Insulin Pen Needle 31G X 5 MM MISC Use pen needles for insulin injection daily 100 each 3  . insulin regular (NOVOLIN R,HUMULIN R) 100 units/mL injection  Inject 0.1-0.16 mLs (10-16 Units total) into the skin 3 (three) times daily before meals. Relion. Please provide insulin syringes needed 20 mL 11  . Insulin Syringe-Needle U-100 (B-D INS SYRINGE 0.5CC/30GX1/2") 30G X 1/2" 0.5 ML MISC USE AS DIRECTED 4 TIMES A DAY 300 each 3  . letrozole (FEMARA) 2.5 MG tablet Take 1 tablet (2.5 mg total) by mouth daily. 90 tablet 1  . lisinopril-hydrochlorothiazide (PRINZIDE,ZESTORETIC) 10-12.5 MG tablet TAKE 1 TABLET BY MOUTH EVERY DAY 90 tablet 3  . lovastatin (MEVACOR) 40 MG tablet Take 1 tablet (40 mg total) by mouth at bedtime. 90 tablet 3  . metFORMIN (GLUCOPHAGE) 500 MG tablet TAKE 2 TABLETS BY MOUTH DAILY WITH SUPPER. 180 tablet 0  . OVER THE COUNTER MEDICATION Take 1 tablet by mouth daily as needed. Stool softner for constipation, also drinks warm prune juice prn    . traMADol (ULTRAM) 50 MG tablet Take 1 tablet (50 mg total) by mouth every 6 (six) hours as needed for moderate pain or severe pain (neck and shoulder pain). 20 tablet 0  . Na Sulfate-K Sulfate-Mg Sulf 17.5-3.13-1.6 GM/177ML SOLN Take 1 kit by mouth as directed. (Patient not taking: Reported on 03/18/2019) 354 mL 0   No current facility-administered medications for this visit.      Objective:  Ht 5' 2" (1.575 m)   Wt 189 lb (85.7 kg)   LMP  (LMP Unknown)   BMI 34.57 kg/m  self reported vitals Gen: NAD, resting comfortably Lungs: nonlabored, normal respiratory rate  Skin: appears dry, no obvious rash     Assessment and Plan   # Diabetes S: Followed by Endocrinology, Dr. Gherghe. Currently taking Metformin 500 mg 2 tablet daily, Lantus 24 units daily, and Novolin R 0.1-0.16 units TID.  Lab Results  Component Value Date   HGBA1C 8.6 (A) 09/05/2018   HGBA1C 9.6 (A) 05/30/2018   HGBA1C 9.3 (A) 02/27/2018  A/P: Poor control last check-we will get an A1c done with our labs so patient will have this available ahead of July visit with Dr. Gherghe  #hypertension/CKD stage III S:  controlled on last in person visit lisinopril -HCT 10-12.5 mg daily, Amlodipine 10 mg daily, Coreg 25 mg daily, and Clonidine 0.3 mg BID.   Has known CKD BP Readings from Last 3 Encounters:  02/06/19 (!) 152/70  01/24/19 (!) 146/86  12/05/18 130/78  A/P: Poor control at last emergency room visit- her daughter-in-law is going to help her check her blood pressure when she gets home-patient will update me on mychart -Also get BMP to monitor CKD stage III   #hyperlipidemia S: Previously poorly controlled on Lovastatin 40 mg.  Intolerant to stronger doses of statin Lab Results  Component Value Date   CHOL 206 (H) 10/10/2017   HDL 65.20 10/10/2017   LDLCALC 119 (H) 10/10/2017   LDLDIRECT 140.0 09/12/2018   TRIG 113.0 10/10/2017   CHOLHDL 3 10/10/2017   A/P: Mild poor control   but intolerant to higher doses of statin-update lipid panel   # Breast Cancer S:Taking Letrazole 2.5 mg daily. Managed by Oncology.   A/P: Stable-continue oncology follow-up  # Left shoulder pain/neck pain is much better- seemed to get better with tramadol after 2 months of pain.  Still sounds like has upcoming MRI.   # Depression S: has not started counseling. Buspirone is still helpful for anxiety.  A/P: Poor control last check with PHQ 9 of 8-patient wants to start counseling but she has not started this yet as she agrees to call and we will get updated PHQ 9 at follow-up and hopefully she will of had several sessions under her belt with counselor  Will call for labs  In 1-2 weeks and follow up in 4 months Future Appointments  Date Time Provider [place]  03/24/2019  9:00 AM [facility] LAB 5 [facility] None  03/24/2019  9:30 AM [name], MD [facility] None  04/07/2019 10:40 AM Marin Olp, MD [facility] PEC  04/10/2019  8:00 AM Philemon Kingdom, MD [facility] None  04/24/2019  9:40 AM [facility] DIAG TOMO 1 [facility]-BREAST CE   Lab/Order associations:   ICD-10-CM   1. Hyperlipidemia associated  with type 2 diabetes mellitus (HCC)  E11.69 Hemoglobin A1c   E78.5 Comprehensive metabolic panel    Lipid panel    CBC with Differential/Platelet  2. Hypertension associated with diabetes ([place])  E11.59    I10   3. Malignant neoplasm of upper-outer quadrant of left breast in female, estrogen receptor positive ([place])  C50.412    Z17.0   4. CKD (chronic kidney disease) stage 3, GFR 30-59 ml/min (HCC)  N18.3   5. Depression, major, single episode, mild ([place])  F32.0    Return precautions advised.  Garret Reddish, MD

## 2019-03-18 NOTE — Telephone Encounter (Signed)
Patient called to see if we have heard from [place]. Patient also asked to see if have any samples of Insulin Glargine (LANTUS SOLOSTAR) 100 UNIT/ML Solostar Pen she will be out of it tomorrow.  Per please call and advise regarding medication from [employer] and whether or not we have samples as she only has medication for today

## 2019-03-18 NOTE — Patient Instructions (Addendum)
   Depression screen [facility] 2/9 12/05/2018 10/15/2018 09/12/2018  Decreased Interest 1 0 0  Down, Depressed, Hopeless 1 0 1  PHQ - 2 Score 2 0 1  Altered sleeping 1 - -  Tired, decreased energy 2 - -  Change in appetite 1 - -  Feeling bad or failure about yourself  2 - -  Trouble concentrating 0 - -  Moving slowly or fidgety/restless 0 - -  Suicidal thoughts 0 - -  PHQ-9 Score 8 - -  Difficult doing work/chores Not difficult at all - -  Some recent data might be hidden

## 2019-03-18 NOTE — Progress Notes (Deleted)
duplicate

## 2019-03-20 ENCOUNTER — Other Ambulatory Visit: Payer: Self-pay | Admitting: Internal Medicine

## 2019-03-20 ENCOUNTER — Other Ambulatory Visit: Payer: Self-pay | Admitting: Family Medicine

## 2019-03-22 ENCOUNTER — Other Ambulatory Visit: Payer: Self-pay | Admitting: Hematology

## 2019-03-24 ENCOUNTER — Other Ambulatory Visit: Payer: Self-pay

## 2019-03-24 ENCOUNTER — Inpatient Hospital Stay: Payer: Medicare Other

## 2019-03-24 ENCOUNTER — Telehealth: Payer: Self-pay | Admitting: Nurse Practitioner

## 2019-03-24 ENCOUNTER — Inpatient Hospital Stay: Payer: Medicare Other | Admitting: Nurse Practitioner

## 2019-03-24 NOTE — Telephone Encounter (Signed)
I talk with patient regarding schedule  

## 2019-03-27 NOTE — Telephone Encounter (Signed)
Refill request

## 2019-03-31 ENCOUNTER — Telehealth: Payer: Self-pay

## 2019-03-31 NOTE — Telephone Encounter (Signed)
Received letter from [employer] patient medication assistance regarding patient's application.  Letter states that due to insurance verification, [employer] advised that it is not a covered benefit at this time or that the proposed use of is not consistent with the patient's health plan policy and therefore is not covered.

## 2019-04-01 ENCOUNTER — Other Ambulatory Visit (INDEPENDENT_AMBULATORY_CARE_PROVIDER_SITE_OTHER): Payer: Medicare Other

## 2019-04-01 ENCOUNTER — Other Ambulatory Visit: Payer: Self-pay

## 2019-04-01 DIAGNOSIS — E1169 Type 2 diabetes mellitus with other specified complication: Secondary | ICD-10-CM | POA: Diagnosis not present

## 2019-04-01 DIAGNOSIS — E785 Hyperlipidemia, unspecified: Secondary | ICD-10-CM | POA: Diagnosis not present

## 2019-04-01 LAB — COMPREHENSIVE METABOLIC PANEL
ALT: 5 U/L (ref 0–35)
AST: 7 U/L (ref 0–37)
Albumin: 3.9 g/dL (ref 3.5–5.2)
Alkaline Phosphatase: 95 U/L (ref 39–117)
BUN: 21 mg/dL (ref 6–23)
CO2: 29 mEq/L (ref 19–32)
Calcium: 9.3 mg/dL (ref 8.4–10.5)
Chloride: 98 mEq/L (ref 96–112)
Creatinine, Ser: 1.3 mg/dL — ABNORMAL HIGH (ref 0.40–1.20)
GFR: 47.96 mL/min — ABNORMAL LOW (ref >60.00)
Glucose, Bld: 312 mg/dL — ABNORMAL HIGH (ref 70–99)
Potassium: 4.2 mEq/L (ref 3.5–5.1)
Sodium: 136 mEq/L (ref 135–145)
Total Bilirubin: 0.3 mg/dL (ref 0.2–1.2)
Total Protein: 6.7 g/dL (ref 6.0–8.3)

## 2019-04-01 LAB — LIPID PANEL
Cholesterol: 183 mg/dL (ref 0–200)
HDL: 55.7 mg/dL (ref >39.00)
LDL Cholesterol: 93 mg/dL (ref 0–99)
NonHDL: 126.97
Total CHOL/HDL Ratio: 3
Triglycerides: 171 mg/dL — ABNORMAL HIGH (ref 0.0–149.0)
VLDL: 34.2 mg/dL (ref 0.0–40.0)

## 2019-04-01 LAB — CBC WITH DIFFERENTIAL/PLATELET
Basophils Absolute: 0 10*3/uL (ref 0.0–0.1)
Basophils Relative: 0.3 % (ref 0.0–3.0)
Eosinophils Absolute: 0.1 10*3/uL (ref 0.0–0.7)
Eosinophils Relative: 1.3 % (ref 0.0–5.0)
HCT: 33.9 % — ABNORMAL LOW (ref 36.0–46.0)
Hemoglobin: 11.3 g/dL — ABNORMAL LOW (ref 12.0–15.0)
Lymphocytes Relative: 16.9 % (ref 12.0–46.0)
Lymphs Abs: 1.1 10*3/uL (ref 0.7–4.0)
MCHC: 33.5 g/dL (ref 30.0–36.0)
MCV: 85.1 fl (ref 78.0–100.0)
Monocytes Absolute: 0.4 10*3/uL (ref 0.1–1.0)
Monocytes Relative: 5.9 % (ref 3.0–12.0)
Neutro Abs: 4.9 10*3/uL (ref 1.4–7.7)
Neutrophils Relative %: 75.6 % (ref 43.0–77.0)
Platelets: 281 10*3/uL (ref 150.0–400.0)
RBC: 3.98 Mil/uL (ref 3.87–5.11)
RDW: 15.5 % (ref 11.5–15.5)
WBC: 6.4 10*3/uL (ref 4.0–10.5)

## 2019-04-01 LAB — HEMOGLOBIN A1C: Hgb A1c MFr Bld: 8.8 % — ABNORMAL HIGH (ref 4.6–6.5)

## 2019-04-02 ENCOUNTER — Inpatient Hospital Stay (HOSPITAL_BASED_OUTPATIENT_CLINIC_OR_DEPARTMENT_OTHER): Payer: Medicare Other | Admitting: Nurse Practitioner

## 2019-04-02 ENCOUNTER — Telehealth: Payer: Self-pay | Admitting: Nurse Practitioner

## 2019-04-02 ENCOUNTER — Encounter: Payer: Self-pay | Admitting: Nurse Practitioner

## 2019-04-02 ENCOUNTER — Other Ambulatory Visit: Payer: Self-pay

## 2019-04-02 ENCOUNTER — Inpatient Hospital Stay: Payer: Medicare Other | Attending: Hematology

## 2019-04-02 ENCOUNTER — Telehealth: Payer: Self-pay

## 2019-04-02 VITALS — BP 115/62 | HR 68 | Temp 98.5°F | Resp 20 | Wt 183.3 lb

## 2019-04-02 DIAGNOSIS — N183 Chronic kidney disease, stage 3 (moderate): Secondary | ICD-10-CM

## 2019-04-02 DIAGNOSIS — R140 Abdominal distension (gaseous): Secondary | ICD-10-CM | POA: Diagnosis not present

## 2019-04-02 DIAGNOSIS — Z923 Personal history of irradiation: Secondary | ICD-10-CM | POA: Diagnosis not present

## 2019-04-02 DIAGNOSIS — E1122 Type 2 diabetes mellitus with diabetic chronic kidney disease: Secondary | ICD-10-CM | POA: Diagnosis not present

## 2019-04-02 DIAGNOSIS — M25512 Pain in left shoulder: Secondary | ICD-10-CM | POA: Insufficient documentation

## 2019-04-02 DIAGNOSIS — N61 Mastitis without abscess: Secondary | ICD-10-CM | POA: Diagnosis not present

## 2019-04-02 DIAGNOSIS — D649 Anemia, unspecified: Secondary | ICD-10-CM

## 2019-04-02 DIAGNOSIS — M79602 Pain in left arm: Secondary | ICD-10-CM | POA: Insufficient documentation

## 2019-04-02 DIAGNOSIS — Z9221 Personal history of antineoplastic chemotherapy: Secondary | ICD-10-CM | POA: Insufficient documentation

## 2019-04-02 DIAGNOSIS — Z79811 Long term (current) use of aromatase inhibitors: Secondary | ICD-10-CM | POA: Diagnosis not present

## 2019-04-02 DIAGNOSIS — Z794 Long term (current) use of insulin: Secondary | ICD-10-CM | POA: Insufficient documentation

## 2019-04-02 DIAGNOSIS — Z79899 Other long term (current) drug therapy: Secondary | ICD-10-CM | POA: Insufficient documentation

## 2019-04-02 DIAGNOSIS — K5909 Other constipation: Secondary | ICD-10-CM | POA: Diagnosis not present

## 2019-04-02 DIAGNOSIS — C50412 Malignant neoplasm of upper-outer quadrant of left female breast: Secondary | ICD-10-CM | POA: Diagnosis not present

## 2019-04-02 DIAGNOSIS — R202 Paresthesia of skin: Secondary | ICD-10-CM

## 2019-04-02 DIAGNOSIS — Z170 Estrogen receptor positive status [ER+]: Secondary | ICD-10-CM | POA: Insufficient documentation

## 2019-04-02 LAB — IRON AND TIBC
Iron: 75 ug/dL (ref 41–142)
Saturation Ratios: 29 % (ref 21–57)
TIBC: 259 ug/dL (ref 236–444)
UIBC: 184 ug/dL (ref 120–384)

## 2019-04-02 LAB — FERRITIN: Ferritin: 83 ng/mL (ref 11–307)

## 2019-04-02 NOTE — Telephone Encounter (Signed)
Scheduled appt per 7/1 los. Printed and mailed calendar.

## 2019-04-02 NOTE — Progress Notes (Signed)
[place]   [phone] [phone]   Clinic Follow up Note   Patient Care Team: Marin Olp, MD as PCP - General (Family Medicine) Philemon Kingdom, MD as Consulting Physician (Internal Medicine) Jalene Mullet, MD as Consulting Physician (Ophthalmology) Erroll Luna, MD as Consulting Physician (General Surgery) Kyung Rudd, MD as Consulting Physician (Radiation Oncology) [name], MD as Consulting Physician (Hematology) Gardenia Phlegm, NP as Nurse Practitioner (Hematology and Oncology) 04/02/2019  CHIEF COMPLAINT: f/u left breast cancer   SUMMARY OF ONCOLOGIC HISTORY: Oncology History Overview Note  Cancer Staging Malignant neoplasm of upper-outer quadrant of left breast in female, estrogen receptor positive ([place]) Staging form: Breast, AJCC 8th Edition - Clinical: Stage IIB (cT2, cN0, cM0, G3, ER: Positive, PR: Negative, HER2: Negative) - Signed by [name], MD on 11/23/2016 - Pathologic stage from 12/28/2016: Stage IIB (pT2, pN1a(sn), cM0, G3, ER: Positive, PR: Negative, HER2: Negative) - Signed by [name], MD on 01/02/2017     Malignant neoplasm of upper-outer quadrant of left breast in female, estrogen receptor positive ([place])  11/01/2016 Mammogram   Diagnostic mammogram and ultrasound of left breast and axilla showed a 3.1 x 2.1 x 1.4 cm (3.3 x 2.0 x 2.7 cm by ultrasound) mass in the upper outer quadrant of the anterior third of the left breast, associated with pleomorphic calcification. There is a 8 mm (1.8cm by [name]) prominent lymph node in the left axilla.   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   11/07/2016 Receptors her2   ER 80% positive, PR negative, HER-2 negative, Ki-67 90%   11/20/2016 Initial Diagnosis   Malignant neoplasm of upper-outer quadrant of left breast in female, estrogen receptor positive ([place])    12/07/2016 Surgery   Left lumpectomy and left axillary sentinel lymph node sampling by Dr. Brantley Stage   12/07/2016 Pathology Results   pT2, pN1 Left Lumpectomy: Grade 3 IDC measuring 3.4 cm, carcinoma broadly present at the superior margin. Grade 3 DCIS. 1 out of 2 left axillary SLN positive for metastatic carcinoma   12/07/2016 Miscellaneous   Mammaprint showed high risk disease, basal type    12/28/2016 Surgery   Re-excision of the previously positive superior margin was negative for malignant cells.    01/04/2017 Surgery   Port inserted   01/18/2017 - 03/22/2017 Chemotherapy   Adjuvant Docetaxel 75 mg/m and Cytoxan 600 mg/m, every 21 days, for total of 4 cycles, with Neulasta on day 2.    04/23/2017 - 05/18/2017 Radiation Therapy   Radiation treatment dates:   04/23/17 - 05/18/17 Administered by Dr. Lisbeth Renshaw  Site/dose:    Left breast/ 42.5 Gy in 17 Fx Boost / 7.5 Gy in 3 Fx   06/12/2017 -  Anti-estrogen oral therapy   Adjuvant letrozole 1 mg daily, plan for 5-7 years    07/20/2017 Mammogram   IMPRESSION: No mammographic evidence of malignancy in either breast. 3.8 cm left breast postsurgical loculated seroma.   09/12/2017 Survivorship     09/19/2018 Imaging   Baseline DEXA 09/19/18 ASSESSMENT: The BMD measured at Femur Neck Left is 0.888 g/cm2 with a T-score of -1.1. This patient is considered osteopenic according to [facility]) criteria.   The scan quality is good. L-4 was excluded due to degenerative changes.   Site Region Measured Date Measured Age YA BMD Significant CHANGE T-score AP Spine  L1-L3      09/19/2018  [age]         -0.7    1.084 g/cm2   DualFemur Neck Left  09/19/2018    [age]         -1.1    0.888 g/cm2   DualFemur Total Mean 09/19/2018    77.2         -0.5    0.946 g/cm2 ASSESSMENT: The probability of a major osteoporotic fracture is 4.5 % within the next ten years.   The probability of hip fracture is 0.8  % within the next 10 years.      CURRENT THERAPY: adjuvant letrozole, started 06/12/17   INTERVAL HISTORY: Ms. Schmall returns for f/u and scheduled. She was last seen by Dr. Burr Medico on 07/11/18. She is doing well. Continues letrozole. Denies hot flash. Denies changes or concerns in her breast such as new lump, nipple discharge. She put off her mammogram due to breast leaking for which she was being followed by Dr. Brantley Stage has now resolved. She has ongoing neck and left shoulder and arm pain with tingling. Ortho is following, she is having MRI next. Appetite and weight are normal for her. Denies n/v/d. Has chronic constipation with BM twice per week. Denies blood in stool. Feels bloated after meals. She will see PCP tomorrow for physical and will ask for GI referral. She is overdue for colonoscopy but delayed due to covid19. She will see a counselor for her grief soon, she occasionally feels lost and nervous since her husband passed away last year.    MEDICAL HISTORY:  Past Medical History:  Diagnosis Date  . Abnormal breast finding 2018   per pt/ having  a lot drainage from left breast nipple  . Breast cancer ([place]) 11/2016   left/  . GERD (gastroesophageal reflux disease)    TUMS as needed  . HTN (hypertension)    states BP has been high recently; has been on med. x 20 yr.  . Hyperlipidemia   . HYPERTENSION, BENIGN SYSTEMIC 11/29/2006   Lisinopril hctz 10-12.25m, amlodipine 135m coreg 2538mID, clonidine 0.3 mg BID.   . [place]ulin dependent diabetes mellitus [place] . Personal history of chemotherapy    2018/finished 6 weeks of chemo in Sep 2018  . Personal history of radiation therapy    2018 left breast/finished radiation in Sept 2018 per pt.    SURGICAL HISTORY: Past Surgical History:  Procedure Laterality Date  . BREAST LUMPECTOMY Left 05/05/2008  . BREAST LUMPECTOMY Left 12/07/2016   malignant  . BREAST LUMPECTOMY WITH RADIOACTIVE SEED AND SENTINEL LYMPH NODE BIOPSY Left 12/07/2016   Procedure: LEFT BREAST  LUMPECTOMY WITH RADIOACTIVE SEED AND SENTINEL LYMPH NODE BIOPSY;  Surgeon: ThoErroll LunaD;  [facility];  Laterality: Left;  . IR FLUORO GUIDE PORT INSERTION RIGHT  01/04/2017  . IR US [place]IDE VASC ACCESS RIGHT  01/04/2017  . PORT-A-CATH REMOVAL N/A 05/30/2017   Procedure: REMOVAL PORT-A-CATH;  Surgeon: CorErroll LunaD;  [facility];  Laterality: N/A;  . RE-EXCISION OF BREAST LUMPECTOMY Left 12/28/2016   Procedure: RE-EXCISION OF BREAST LUMPECTOMY;  Surgeon: ThoErroll LunaD;  [facility];  Laterality: Left;    I have reviewed the social history and family history with the patient and they are unchanged from previous note.  ALLERGIES:  has No Known Allergies.  MEDICATIONS:  Current Outpatient Medications  Medication Sig Dispense Refill  . amLODipine (NORVASC) 10 MG tablet TAKE  1 TABLET BY MOUTH EVERY DAY 90 tablet 1  . aspirin 81 MG tablet Take 81 mg by mouth daily.      . B-D INS SYRINGE 0.5CC/31GX5/16 31G X 5/16" 0.5 ML MISC USE AS DIRECTED 4 TIMES A DAY 300 each 1  . busPIRone (BUSPAR) 7.5 MG tablet Take 1 tablet (7.5 mg total) by mouth 3 (three) times daily. 270 tablet 1  . carvedilol (COREG) 25 MG tablet Take 1 tablet (25 mg total) by mouth 2 (two) times daily with a meal. 180 tablet 2  . cloNIDine (CATAPRES) 0.3 MG tablet TAKE 1 TABLET (0.3 MG TOTAL) BY MOUTH 2 (TWO) TIMES DAILY. 180 tablet 1  . Insulin Glargine (LANTUS SOLOSTAR) 100 UNIT/ML Solostar Pen Inject 24 units into the skin 10 pen 5  . Insulin Pen Needle 31G X 5 MM MISC Use pen needles for insulin injection daily 100 each 3  . insulin regular (NOVOLIN R,HUMULIN R) 100 units/mL injection Inject 0.1-0.16 mLs (10-16 Units total) into the skin 3 (three) times daily before meals. Relion. Please provide insulin syringes needed 20 mL 11  . Insulin Syringe-Needle U-100 (B-D INS SYRINGE 0.5CC/30GX1/2") 30G X 1/2" 0.5 ML MISC  USE AS DIRECTED 4 TIMES A DAY 300 each 3  . letrozole (FEMARA) 2.5 MG tablet TAKE 1 TABLET BY MOUTH EVERY DAY 90 tablet 1  . lisinopril-hydrochlorothiazide (PRINZIDE,ZESTORETIC) 10-12.5 MG tablet TAKE 1 TABLET BY MOUTH EVERY DAY 90 tablet 3  . lovastatin (MEVACOR) 40 MG tablet Take 1 tablet (40 mg total) by mouth at bedtime. 90 tablet 3  . metFORMIN (GLUCOPHAGE) 500 MG tablet TAKE 2 TABLETS BY MOUTH DAILY WITH SUPPER. 180 tablet 0  . Na Sulfate-K Sulfate-Mg Sulf 17.5-3.13-1.6 GM/177ML SOLN Take 1 kit by mouth as directed. 354 mL 0  . OVER THE COUNTER MEDICATION Take 1 tablet by mouth daily as needed. Stool softner for constipation, also drinks warm prune juice prn    . traMADol (ULTRAM) 50 MG tablet Take 1 tablet (50 mg total) by mouth every 6 (six) hours as needed for moderate pain or severe pain (neck and shoulder pain). 20 tablet 0   No current facility-administered medications for this visit.     PHYSICAL EXAMINATION: ECOG PERFORMANCE STATUS: 1 - Symptomatic but completely ambulatory  Vitals:   04/02/19 1103  BP: 115/62  Pulse: 68  Resp: 20  Temp: 98.5 F (36.9 C)  SpO2: 100%   Filed Weights   04/02/19 1103  Weight: 183 lb 4.8 oz (83.1 kg)    GENERAL:alert, no distress and comfortable SKIN: no rashes or significant lesions EYES:  sclera clear LYMPH:  no palpable cervical, supraclavicular, or axillary lymphadenopathy LUNGS: respirations even and unlabored  HEART: regular rate & rhythm, trace lower extremity edema ABDOMEN:abdomen round Musculoskeletal: limited left arm ROM NEURO: alert & oriented x 3 with fluent speech, normal gait Breast exam: s/p left lumpectomy, breast tissue is distorted. Incisions completely healed. No palpable mass in either breast or axilla that I could appreciate.  Limited exam for covid19 outbreak   LABORATORY DATA:  I have reviewed the data as listed CBC Latest Ref Rng & Units 04/01/2019 02/06/2019 09/12/2018  WBC 4.0 - 10.5 K/uL 6.4 8.2 6.1   Hemoglobin 12.0 - 15.0 g/dL 11.3(L) 12.2 11.6(L)  Hematocrit 36.0 - 46.0 % 33.9(L) 36.6 34.6(L)  Platelets 150.0 - 400.0 K/uL 281.0 249 277.0     CMP Latest Ref Rng & Units 04/01/2019 02/06/2019 09/12/2018  Glucose 70 - 99 mg/dL 312(H) 276(H) 204(H)  BUN 6 - 23 mg/dL 21 13 19   Creatinine 0.40 - 1.20 mg/dL 1.30(H) 1.01(H) 1.11  Sodium 135 - 145 mEq/L 136 137 138  Potassium 3.5 - 5.1 mEq/L 4.2 4.6 4.3  Chloride 96 - 112 mEq/L 98 102 101  CO2 19 - 32 mEq/L 29 21(L) 31  Calcium 8.4 - 10.5 mg/dL 9.3 9.3 9.5  Total Protein 6.0 - 8.3 g/dL 6.7 - 7.0  Total Bilirubin 0.2 - 1.2 mg/dL 0.3 - 0.4  Alkaline Phos 39 - 117 U/L 95 - 96  AST 0 - 37 U/L 7 - 9  ALT 0 - 35 U/L 5 - 5      RADIOGRAPHIC STUDIES: I have personally reviewed the radiological images as listed and agreed with the findings in the report. No results found.   ASSESSMENT & PLAN:  Mrs. Pytel is a 78 y.o. female who presented with a self palpable left breast mass  1. Malignant neoplasm of upper-outer quadrant of left breast, Invasive Ductal Carcinoma, pT2pN1M0, stage IIB, ER+/PR-/HER2-, G3, mammaprint high risk  -S/p lumpectomy and reexcision for positive margins, mammaprint showed high risk, basal type. She completed adjuvant chemotherapy with TC x4 cycles (12/2016-03/2017) followed by radiation per Dr. Lisbeth Renshaw from 04/2017 - 05/2017.  -She began adjuvant letrozole in 06/2017. She is tolerating very well overall without appreciable side effects. Plan to continue for total 7 years if she tolerates well.  -Ms. Phung appears well today. physical exam unremarkable. No clinical concern for recurrence. She is scheduled for mammogram 04/24/19. She continues f/u with Dr. Brantley Stage next month -labs reviewed from 6/30, Hb 11.3, Cr 1.3, BG 312; she will f/u with PCP on 7/6.  -CA 27.29 pending  -continue breast cancer surveillance, continue letrozole -f/u in 6 months   2. H/o breast cellulitis in 2019 -followed by Dr. Brantley Stage; s/p fluid aspiration  and antibiotics -next f/u in 1 month   3. HTN, DM, CKD III -BP WNL today -6/30 labs show BG 312, Cr 1.3 -F/u with PCP scheduled 04/07/19   4. Mild anemia  -mild, fluctuates -Denies GI or other bleeding  -Hb 11.3 today -Iron and TIBC normal; ferritin, folate, MMA pending. Will call patient with results  -She will f/u with PCP 7/6, she will get referral back to GI from Dr. Yong Channel next week.   PLAN: -Labs reviewed -Continue breast cancer surveillance -Continue letrozole -F/u with PCP 04/07/19 as scheduled  -Anticipate referral to GI (from PCP) -Pt to see grief counselor in near future  -Mammogram 04/24/19  -f/u with Dr. Brantley Stage next month  -F/u with Dr. Burr Medico in 6 months    All questions were answered. The patient knows to call the clinic with any problems, questions or concerns. No barriers to learning was detected.     Alla Feeling, NP 04/02/19

## 2019-04-02 NOTE — Telephone Encounter (Signed)
Notified patient lantus arrived, she will pick up.

## 2019-04-03 LAB — FOLATE RBC
Folate, Hemolysate: 350 ng/mL
Folate, RBC: 1042 ng/mL (ref >498)
Hematocrit: 33.6 % — ABNORMAL LOW (ref 34.0–46.6)

## 2019-04-03 LAB — CANCER ANTIGEN 27.29: CA 27.29: 23.1 U/mL (ref 0.0–38.6)

## 2019-04-05 LAB — METHYLMALONIC ACID, SERUM: Methylmalonic Acid, Quantitative: 270 nmol/L (ref 0–378)

## 2019-04-07 ENCOUNTER — Ambulatory Visit (INDEPENDENT_AMBULATORY_CARE_PROVIDER_SITE_OTHER): Payer: Medicare Other | Admitting: Family Medicine

## 2019-04-07 ENCOUNTER — Other Ambulatory Visit: Payer: Self-pay

## 2019-04-07 ENCOUNTER — Encounter: Payer: Self-pay | Admitting: Family Medicine

## 2019-04-07 VITALS — BP 128/70 | HR 87 | Temp 98.3°F | Ht 62.0 in | Wt 184.6 lb

## 2019-04-07 DIAGNOSIS — E1159 Type 2 diabetes mellitus with other circulatory complications: Secondary | ICD-10-CM

## 2019-04-07 DIAGNOSIS — I152 Hypertension secondary to endocrine disorders: Secondary | ICD-10-CM

## 2019-04-07 DIAGNOSIS — F320 Major depressive disorder, single episode, mild: Secondary | ICD-10-CM

## 2019-04-07 DIAGNOSIS — E785 Hyperlipidemia, unspecified: Secondary | ICD-10-CM

## 2019-04-07 DIAGNOSIS — N183 Chronic kidney disease, stage 3 unspecified: Secondary | ICD-10-CM

## 2019-04-07 DIAGNOSIS — D649 Anemia, unspecified: Secondary | ICD-10-CM

## 2019-04-07 DIAGNOSIS — E1169 Type 2 diabetes mellitus with other specified complication: Secondary | ICD-10-CM

## 2019-04-07 DIAGNOSIS — I10 Essential (primary) hypertension: Secondary | ICD-10-CM

## 2019-04-07 NOTE — Progress Notes (Addendum)
Phone [phone]   Subjective:  Wendy [name] is a 78 y.o. year old very pleasant female patient who presents for/with See problem oriented charting Chief Complaint  Patient presents with  . Hypertension   ROS-  No fever, chills, cough, congestion, runny nose, shortness of breath, fatigue, body aches, sore throat, headache, nausea, vomiting, diarrhea, or new loss of taste or smell. No known contacts with covid 19 or someone being tested for covid 19.   Past Medical History-  Patient Active Problem List   Diagnosis Date Noted  . Shortness of breath 03/14/2017    Priority: High  . Malignant neoplasm of upper-outer quadrant of left breast in female, estrogen receptor positive ([place]) 11/20/2016    Priority: High  . 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 Diastolic dysfunction 09/81/1914    Priority: High  . Depression, major, single episode, mild ([place]) 12/05/2018    Priority: Medium  . Rectal bleeding 01/17/2017    Priority: Medium  . CKD (chronic kidney disease) stage 3, GFR 30-59 ml/min (H[facility]) 03/17/2016    Priority: Medium  . Osteopenia 01/05/2016    Priority: Medium  . TOBACCO USE 04/13/2008    Priority: Medium  . Hyperlipidemia associated with type 2 diabetes mellitus ([place]) 11/29/2006    Priority: Medium  . OBESITY, BMI 30-35 11/29/2006    Priority: Medium  . Hypertension associated with diabetes ([place]) 11/29/2006    Priority: Medium  . Fatigue 03/14/2017    Priority: Low  . Edema 03/14/2017    Priority: Low  . Port catheter in place 01/18/2017    Priority: Low  . Constipation 01/17/2017    Priority: Low  . Seasonal allergies 12/01/2013    Priority: Low  . Grief 05/10/2018  . Anemia 01/09/2018    Medications- reviewed and updated Current Outpatient Medications  Medication Sig Dispense Refill  . amLODipine (NORVASC) 10 MG tablet  TAKE 1 TABLET BY MOUTH EVERY DAY 90 tablet 1  . aspirin 81 MG tablet Take 81 mg by mouth daily.      . B-D INS SYRINGE 0.5CC/31GX5/16 31G X 5/16" 0.5 ML MISC USE AS DIRECTED 4 TIMES A DAY 300 each 1  . busPIRone (BUSPAR) 7.5 MG tablet Take 1 tablet (7.5 mg total) by mouth 3 (three) times daily. 270 tablet 1  . carvedilol (COREG) 25 MG tablet Take 1 tablet (25 mg total) by mouth 2 (two) times daily with a meal. 180 tablet 2  . cloNIDine (CATAPRES) 0.3 MG tablet TAKE 1 TABLET (0.3 MG TOTAL) BY MOUTH 2 (TWO) TIMES DAILY. 180 tablet 1  . Influenza Vac A&B Surf Ant Adj (FLUAD) 0.5 ML SUSY Fluad 2019-20 27yrup(PF)45 mcg(15 mcgx3)/0.5 mL intramuscular syringe  TO BE ADMINISTERED BY PHARMACIST FOR IMMUNIZATION    . Insulin Glargine (LANTUS SOLOSTAR) 100 UNIT/ML Solostar Pen Inject 24 units into the skin 10 pen 5  . Insulin Pen Needle 31G X 5 MM MISC Use pen needles for insulin injection daily 100 each 3  . insulin regular (NOVOLIN R,HUMULIN R) 100 units/mL injection Inject 0.1-0.16 mLs (10-16 Units total) into the skin 3 (three) times daily before meals. Relion. Please provide insulin syringes needed 20 mL 11  . Insulin Syringe-Needle U-100 (B-D INS SYRINGE 0.5CC/30GX1/2") 30G X 1/2" 0.5 ML MISC USE AS DIRECTED 4 TIMES A DAY 300 each 3  . letrozole (FEMARA) 2.5 MG tablet TAKE 1 TABLET  BY MOUTH EVERY DAY 90 tablet 1  . lisinopril-hydrochlorothiazide (PRINZIDE,ZESTORETIC) 10-12.5 MG tablet TAKE 1 TABLET BY MOUTH EVERY DAY 90 tablet 3  . lovastatin (MEVACOR) 40 MG tablet Take 1 tablet (40 mg total) by mouth at bedtime. 90 tablet 3  . metFORMIN (GLUCOPHAGE) 500 MG tablet TAKE 2 TABLETS BY MOUTH DAILY WITH SUPPER. 180 tablet 0  . Na Sulfate-K Sulfate-Mg Sulf 17.5-3.13-1.6 GM/177ML SOLN Take 1 kit by mouth as directed. 354 mL 0  . OVER THE COUNTER MEDICATION Take 1 tablet by mouth daily as needed. Stool softner for constipation, also drinks warm prune juice prn    . traMADol (ULTRAM) 50 MG tablet Take 1 tablet  (50 mg total) by mouth every 6 (six) hours as needed for moderate pain or severe pain (neck and shoulder pain). 20 tablet 0   No current facility-administered medications for this visit.      Objective:  BP 128/70   Pulse 87   Temp 98.3 F (36.8 C) (Oral)   Ht 5' 2"  (1.575 m)   Wt 184 lb 9.6 oz (83.7 kg)   LMP  (LMP Unknown)   SpO2 97%   BMI 33.76 kg/m  Gen: NAD, resting comfortably CV: RRR no murmurs rubs or gallops Lungs: CTAB no crackles, wheeze, rhonchi Abdomen: soft/nontender/nondistended/normal bowel sounds Ext: trace edema Skin: warm, dry Neuro: grossly normal, moves all extremities     Assessment and Plan   # Diabetes On last check-poorly controlled on Metformin 500 mg 2 daily, Lantus, and Novolin. Managed by Dr. Myrene Buddy her later this week Lab Results  Component Value Date   HGBA1C 8.8 (H) 04/01/2019  - We will send ROI off to get copy of last DM eye exam  #hypertension/CKD stage III  s: controlled on Lisinopril-HCT 10-12.5 mg, Amlodipine 10 mg, Carvedilol 25 mg BID, and Clonidine 0.3 mg BID.   Chronic kidney disease has been largely stable with last creatinine at 1.3 BP Readings from Last 3 Encounters:  04/07/19 128/70  04/02/19 115/62  02/06/19 (!) 152/70  A/P:  Stable x2. Continue current medications.   - we changed last year to diastolic dysfunction from Diastolic CHF. Would reduce amlodipine if did in fact have diastolic CHF if possible  #hyperlipidemia S: Reasonably controlled on Lovastatin 40 mg and Aspirin 81 mg.  Intolerant to stronger doses of statin Lab Results  Component Value Date   CHOL 183 04/01/2019   HDL 55.70 04/01/2019   LDLCALC 93 04/01/2019   LDLDIRECT 140.0 09/12/2018   TRIG 171.0 (H) 04/01/2019   CHOLHDL 3 04/01/2019   A/P: Reasonable control with LDL at least under 100-continue current medication   # Depression/anxiety S: Taking Buspar 7.5 mg TID-helpful for anxiety portion.  PHQ 9 was elevated last visit but she wanted  to start counseling instead of adding medication- she has not yest called for counseling- she plans to do so  A/P: Poor control still with phq9 mildly elevated at 8- advised calling for counseling- hoping she has this started by next visit  # oncology advised GI follow up for anemia- will refer today  Advised 4 month follow-up Future Appointments  Date Time Provider [place]  04/10/2019  8:00 AM Philemon Kingdom, MD [facility] None  04/24/2019  9:40 AM [facility] DIAG TOMO 1 [facility] GI-BREAST CE  10/06/2019 11:00 AM [facility] LAB 4 [facility] None  10/06/2019 11:30 AM [name], MD [facility] None   Lab/Order associations:   ICD-10-CM   1. Hyperlipidemia associated with type 2 diabetes mellitus (  [place])  E11.69    E78.5   2. Hypertension associated with diabetes ([place])  E11.59    I10   3. CKD (chronic kidney disease) stage 3, GFR 30-59 ml/min (HCC)  N18.3   4. Depression, major, single episode, mild ([place])  F32.0    Return precautions advised.  Garret Reddish, MD

## 2019-04-07 NOTE — Addendum Note (Signed)
Addended by: Marin Olp on: 04/07/2019 12:13 PM   Modules accepted: Orders

## 2019-04-07 NOTE — Patient Instructions (Addendum)
Health Maintenance Due  Topic Date Due  . OPHTHALMOLOGY EXAM - signed today- we will fax to optho 03/28/2019   Please call [facility] to get set up for counseling  No other changes today  Schedule 4 month follow up or sooner if you need [name]

## 2019-04-10 ENCOUNTER — Ambulatory Visit: Payer: Medicare Other | Admitting: Internal Medicine

## 2019-04-11 ENCOUNTER — Other Ambulatory Visit: Payer: Self-pay

## 2019-04-11 ENCOUNTER — Ambulatory Visit (INDEPENDENT_AMBULATORY_CARE_PROVIDER_SITE_OTHER): Payer: Medicare Other | Admitting: Internal Medicine

## 2019-04-11 DIAGNOSIS — Z794 Long term (current) use of insulin: Secondary | ICD-10-CM | POA: Diagnosis not present

## 2019-04-11 DIAGNOSIS — IMO0002 Reserved for concepts with insufficient information to code with codable children: Secondary | ICD-10-CM

## 2019-04-11 DIAGNOSIS — E785 Hyperlipidemia, unspecified: Secondary | ICD-10-CM

## 2019-04-11 DIAGNOSIS — E669 Obesity, unspecified: Secondary | ICD-10-CM

## 2019-04-11 DIAGNOSIS — E113419 Type 2 diabetes mellitus with severe nonproliferative diabetic retinopathy with macular edema, unspecified eye: Secondary | ICD-10-CM | POA: Diagnosis not present

## 2019-04-11 DIAGNOSIS — E1165 Type 2 diabetes mellitus with hyperglycemia: Secondary | ICD-10-CM | POA: Diagnosis not present

## 2019-04-11 NOTE — Progress Notes (Signed)
Patient ID: Wendy [name], female   DOB: 08-06-1941, 78 y.o.   MRN: [id number]  Patient location: Home My location: Office  Referring Provider: Marin Olp, MD  I connected with the patient on 04/11/19 at  2:06 PM EDT by telephone and verified that I am speaking with the correct person.   I discussed the limitations of evaluation and management by telephone and the availability of in person appointments. The patient expressed understanding and agreed to proceed.   Details of the encounter are shown below.  HPI: Wendy [name] is a 78 y.o.-year-old female, returning for f/u for DM2, dx in 1980s, insulin-dependent since 1990, uncontrolled, with complications (CKD, DR). Last visit 7 months ago.  Last hemoglobin A1c was Lab Results  Component Value Date   HGBA1C 8.8 (H) 04/01/2019   HGBA1C 8.6 (A) 09/05/2018   HGBA1C 9.6 (A) 05/30/2018    Pt is on: - Metformin 1000 mg 2x a day with meals - Lantus 24 units in am - Novolin R before a meal: -10 units with a smaller meal -12 units with a regular meal  Lantus costs her 149$ per box >> she applied for financial assistance to get this free. She got this >> received the med.  Pt checks her sugars 4-6 times a day:  - am: 177-265 >> 131-171 >> 144-261 >> 123, 241-278 (after PB cracker and OJ) - 2h after b'fast:  n/c >> 220-344 >> 352 >> n/c - before lunch:113, 148 >> n/c >> 280 >> 237-241 - 2h after lunch:n/c >> 255-267 >> 236-335, 460 >> 310-345 - before dinner:  200 >> 81, 170-275 >> n/c  - 2h after dinner:135 >> n/c >> 244, 298, 384  >> 300-344 - bedtime: 180, 244-387 >> 275, 393, 394, 407 >> same - nighttime:  n/c >> 73 x1, 130 >> n/c >> 312 >> 4 am: 140-190 Lowest sugar was 32 >> .Marland Kitchen. 144 >> 120; she has hypoglycemia awareness in the 70s. Highest sugar was 400s >> 300s.  Glucometer: [employer]  Pt's meals are: - Breakfast: toast, egg, bacon/sausage; oatmeal - Lunch: [place] sandwich, soups, crackers - Dinner: baked chicken,  potatoes, cabbage (dinner is her largest meal) - Snacks: PB and crackers  -+ CKD, last BUN/creatinine:  Lab Results  Component Value Date   BUN 21 04/01/2019   CREATININE 1.30 (H) 04/01/2019  On lisinopril 10. -+ HL; last set of lipids: Lab Results  Component Value Date   CHOL 183 04/01/2019   HDL 55.70 04/01/2019   LDLCALC 93 04/01/2019   LDLDIRECT 140.0 09/12/2018   TRIG 171.0 (H) 04/01/2019   CHOLHDL 3 04/01/2019  On lovastatin 40. 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  She has a history of laser surgery. She usually sees Dr. Kathlen Mody.   -+ Neuropathic pain in her feet On ASA 81.  She has breast cancer and had left breast lumpectomy + chemotherapy for 4 weeks + radiotherapy.    Her husband died May 10, 2018.  They have been married for 55 years.  She started Buspar to help with grief/insomnia/anxiety.  ROS: + See HPI Constitutional: no weight gain/no weight loss, no fatigue, no subjective hyperthermia, no subjective hypothermia Eyes: no blurry vision, no xerophthalmia ENT: no sore throat, no nodules palpated in neck, no dysphagia, no odynophagia, no hoarseness Cardiovascular: no CP/no SOB/no palpitations/no leg swelling Respiratory: no cough/no SOB/no wheezing Gastrointestinal: no N/no V/no D/no C/no acid reflux Musculoskeletal: no muscle aches/no joint aches Skin: no rashes,  no hair loss Neurological: no tremors/no numbness/no tingling/no dizziness  I reviewed pt's medications, allergies, PMH, social hx, family hx, and changes were documented in the history of present illness. Otherwise, unchanged from my initial visit note.  Past Medical History:  Diagnosis Date  . Abnormal breast finding 2018   per pt/ having  a lot drainage from left breast nipple  . Breast cancer ([place]) 11/2016   left/  . GERD (gastroesophageal reflux disease)    TUMS as needed  . HTN (hypertension)    states BP has been high recently; has been on med. x 20 yr.  .  Hyperlipidemia   . HYPERTENSION, BENIGN SYSTEMIC 11/29/2006   Lisinopril hctz 10-12.29m, amlodipine 148m coreg 2563mID, clonidine 0.3 mg BID.   . [place] . Personal history of chemotherapy    2018/finished 6 weeks of chemo in Sep 2018  . Personal history of radiation therapy    2018 left breast/finished radiation in Sept 2018 per pt.   Past Surgical History:  Procedure Laterality Date  . BREAST LUMPECTOMY Left 05/05/2008  . BREAST LUMPECTOMY Left 12/07/2016   malignant  . BREAST LUMPECTOMY WITH RADIOACTIVE SEED AND SENTINEL LYMPH NODE BIOPSY Left 12/07/2016   Procedure: LEFT BREAST LUMPECTOMY WITH RADIOACTIVE SEED AND SENTINEL LYMPH NODE BIOPSY;  Surgeon: ThoErroll LunaD;  [facility];  Laterality: Left;  . IR FLUORO GUIDE PORT INSERTION RIGHT  01/04/2017  . IR US [place]IDE VASC ACCESS RIGHT  01/04/2017  . PORT-A-CATH REMOVAL N/A 05/30/2017   Procedure: REMOVAL PORT-A-CATH;  Surgeon: CorErroll LunaD;  [facility];  Laterality: N/A;  . RE-EXCISION OF BREAST LUMPECTOMY Left 12/28/2016   Procedure: RE-EXCISION OF BREAST LUMPECTOMY;  Surgeon: ThoErroll LunaD;  Location: [facility] [place];  Service: General;  Laterality: Left;   Social History   Social History  . Marital Status: Married    Spouse Name: N/A  . Number of Children: 4   . retired    Social History Main Topics  . Smoking status: Never Smoker   . Smokeless tobacco: Former UseSystems developer Types: Snuff    Quit date: 10/02/2014     Comment: Quit 10/02/2014  previous 1 box per week of snuff   . Alcohol Use: No  . Drug Use: No   Current Outpatient Medications on File Prior to Visit  Medication Sig Dispense Refill  . amLODipine (NORVASC) 10 MG tablet TAKE 1 TABLET BY MOUTH EVERY DAY 90 tablet 1  . aspirin 81 MG tablet Take 81 mg by mouth daily.      . B-D INS SYRINGE 0.5CC/31GX5/16 31G X 5/16" 0.5 ML MISC USE AS DIRECTED 4  TIMES A DAY 300 each 1  . busPIRone (BUSPAR) 7.5 MG tablet Take 1 tablet (7.5 mg total) by mouth 3 (three) times daily. 270 tablet 1  . carvedilol (COREG) 25 MG tablet Take 1 tablet (25 mg total) by mouth 2 (two) times daily with a meal. 180 tablet 2  . cloNIDine (CATAPRES) 0.3 MG tablet TAKE 1 TABLET (0.3 MG TOTAL) BY MOUTH 2 (TWO) TIMES DAILY. 180 tablet 1  . Insulin Glargine (LANTUS SOLOSTAR) 100 UNIT/ML Solostar Pen Inject 24 units into the skin 10 pen 5  . Insulin Pen Needle 31G X 5 MM MISC Use pen needles for insulin injection daily 100 each 3  . insulin regular (NOVOLIN R,HUMULIN R) 100 units/mL injection Inject 0.1-0.16 mLs (10-16  Units total) into the skin 3 (three) times daily before meals. Relion. Please provide insulin syringes needed 20 mL 11  . Insulin Syringe-Needle U-100 (B-D INS SYRINGE 0.5CC/30GX1/2") 30G X 1/2" 0.5 ML MISC USE AS DIRECTED 4 TIMES A DAY 300 each 3  . letrozole (FEMARA) 2.5 MG tablet TAKE 1 TABLET BY MOUTH EVERY DAY 90 tablet 1  . lisinopril-hydrochlorothiazide (PRINZIDE,ZESTORETIC) 10-12.5 MG tablet TAKE 1 TABLET BY MOUTH EVERY DAY 90 tablet 3  . lovastatin (MEVACOR) 40 MG tablet Take 1 tablet (40 mg total) by mouth at bedtime. 90 tablet 3  . metFORMIN (GLUCOPHAGE) 500 MG tablet TAKE 2 TABLETS BY MOUTH DAILY WITH SUPPER. 180 tablet 0  . Na Sulfate-K Sulfate-Mg Sulf 17.5-3.13-1.6 GM/177ML SOLN Take 1 kit by mouth as directed. 354 mL 0  . OVER THE COUNTER MEDICATION Take 1 tablet by mouth daily as needed. Stool softner for constipation, also drinks warm prune juice prn    . traMADol (ULTRAM) 50 MG tablet Take 1 tablet (50 mg total) by mouth every 6 (six) hours as needed for moderate pain or severe pain (neck and shoulder pain). 20 tablet 0   No current facility-administered medications on file prior to visit.    No Known Allergies Family History  Problem Relation Age of Onset  . Diabetes Sister        x 4  . Heart failure Sister   . Diabetes Brother   . Heart  disease Brother   . Diabetes Paternal Grandmother   . Diabetes Paternal Aunt   . Heart attack Brother   . Arthritis Sister   . Hypertension Sister   . Diabetes Sister   . Emphysema Brother   . Cancer Father 52       brain tumor   . Diabetes Child   . Asthma Child   . Hypertension Child   . Diabetes Child   . Diabetes Child   . Colon cancer Neg Hx   . Stomach cancer Neg Hx    PE: LMP  (LMP Unknown)  There is no height or weight on file to calculate BMI. Wt Readings from Last 3 Encounters:  04/07/19 184 lb 9.6 oz (83.7 kg)  04/02/19 183 lb 4.8 oz (83.1 kg)  03/18/19 189 lb (85.7 kg)   Constitutional:  in NAD  The physical exam was not performed (telephone visit).  ASSESSMENT: 1. 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 HL  3. Obesity  PLAN:  1.  Patient with longstanding, uncontrolled, type 2 diabetes, insulin regimen and metformin.  She has a history of noncompliance with the recommended regimen finances.  We discussed in the past about switching to a more affordable regimen of NPH and regular insulin from [facility] but she applied for financial assistance with Lantus and metformin and these were covered.  We did increase the doses of her NovoLog at the previous visit and at last visit, sugars were still high in the 300s and 400s so we increased them further. - at this visit, her sugars are still high as she is not taking enough mealtime insulin despite repeated advice to use more.  She then develops hyperglycemia after meals which she is trying to correct and this practice is followed by abrupt decreases in blood sugars especially during the night.  We discussed that taking enough insulin with meals will allow her to not develop hyperglycemia so therefore we may be able to avoid dropping her sugars at night. -  At this point, she tells me that she is on Novolin R and I advised her to increase the doses (see  below).  In the meantime, since sugars are high at all times of the day, will also increase Lantus.  - I suggested to:  Patient Instructions  Please continue: - Metformin 1000 mg 2x a day with meals  Please increase: - Lantus 28 units in am - Novolin R before a meal: -14 units with a smaller meal -16 units with a regular meal -18  units with a large meal/if you eat out/if you have dessert  Please come back in 3-4 months with your sugar log  - we will check HbA1c when she returns - advised to check sugars at different times of the day - 3x a day, rotating check times - advised for yearly eye exams >> she is not UTD - return to clinic in 3-4 months    2. HL - Reviewed latest lipid panel from 03/2019: LDL improved, at goal, triglycerides high Lab Results  Component Value Date   CHOL 183 04/01/2019   HDL 55.70 04/01/2019   LDLCALC 93 04/01/2019   LDLDIRECT 140.0 09/12/2018   TRIG 171.0 (H) 04/01/2019   CHOLHDL 3 04/01/2019  - Continues lovastatin 40 without side effects.  3. Obesity -No weight loss since last visit -Continue metformin which is weight stabilizing  - time spent with the patient: 12 min, of which >50% was spent in obtaining information about her symptoms, reviewing her previous labs, evaluations, and treatments, counseling her about her conditions (please see the discussed topics above), and developing a plan to further investigate and treat them.  Philemon Kingdom, MD PhD [facility]

## 2019-04-11 NOTE — Patient Instructions (Addendum)
Please continue: - Metformin 1000 mg 2x a day with meals  Please increase: - Lantus 28 units in am - Novolin R before a meal: -14 units with a smaller meal -16 units with a regular meal -18  units with a large meal/if you eat out/if you have dessert  Please come back in 3-4 months with your sugar log

## 2019-04-16 ENCOUNTER — Encounter: Payer: Self-pay | Admitting: Family Medicine

## 2019-04-19 ENCOUNTER — Other Ambulatory Visit: Payer: Self-pay | Admitting: Family Medicine

## 2019-04-22 ENCOUNTER — Encounter: Payer: Self-pay | Admitting: Internal Medicine

## 2019-04-22 NOTE — Addendum Note (Signed)
Addended by: Philemon Kingdom on: 04/22/2019 02:12 PM   Modules accepted: Level of Service

## 2019-05-01 NOTE — Telephone Encounter (Signed)
Wendy [name], can you help with this. Pt was  Referred to [facility] but has not heard from anyone.

## 2019-05-12 ENCOUNTER — Encounter: Payer: Self-pay | Admitting: *Deleted

## 2019-05-19 ENCOUNTER — Other Ambulatory Visit: Payer: Self-pay | Admitting: Family Medicine

## 2019-05-21 ENCOUNTER — Telehealth: Payer: Self-pay

## 2019-05-21 NOTE — Telephone Encounter (Signed)
Refill form filled out and faxed for Lantus to [facility].

## 2019-05-22 ENCOUNTER — Encounter: Payer: Self-pay | Admitting: Internal Medicine

## 2019-05-22 ENCOUNTER — Ambulatory Visit (INDEPENDENT_AMBULATORY_CARE_PROVIDER_SITE_OTHER): Payer: Medicare Other | Admitting: Internal Medicine

## 2019-05-22 VITALS — BP 132/70 | HR 66 | Temp 98.7°F | Ht 62.0 in | Wt 188.4 lb

## 2019-05-22 DIAGNOSIS — D649 Anemia, unspecified: Secondary | ICD-10-CM | POA: Diagnosis not present

## 2019-05-22 DIAGNOSIS — K5900 Constipation, unspecified: Secondary | ICD-10-CM

## 2019-05-22 DIAGNOSIS — Z1211 Encounter for screening for malignant neoplasm of colon: Secondary | ICD-10-CM | POA: Diagnosis not present

## 2019-05-22 MED ORDER — SUPREP BOWEL PREP KIT 17.5-3.13-1.6 GM/177ML PO SOLN: 1.0000 | ORAL | 0 refills | Status: DC

## 2019-05-22 MED ORDER — POLYETHYLENE GLYCOL 3350 17 GM/SCOOP PO POWD: 17.0000 g | Freq: Every day | ORAL | 0 refills | Status: AC

## 2019-05-22 NOTE — Progress Notes (Signed)
Subjective:    Patient ID: Wendy [name], female    DOB: 08-Mar-1941, 78 y.o.   MRN: [id number]  HPI Wendy [name] is a 78 year old female with a history of GERD, constipation, history of breast cancer, anemia, hypertension, who is seen in follow-up to evaluate constipation and also her anemia.  She is here alone today.  She was last seen in December 2018 by Ellouise Newer, PA-C.  She reports that she has continued to deal with constipation and infrequent hard stool.  Bowel movement every 3 to 4 days.  No blood in her stool or melena.  Will feel stomach upset in the morning and after eating worse when she is more constipated.  She has been using MiraLAX every 3 days and intermittently using prune juice.  This has not been totally effective.  She denies recent trouble with heartburn, dysphagia and odynophagia.  She had a colonoscopy 10 years ago with hyperplastic distal polyps.  Review of Systems As per HPI, otherwise negative    Objective:   Physical Exam BP 132/70 (BP Location: Right Arm)   Pulse 66   Temp 98.7 F (37.1 C) (Oral)   Ht 5\' 2"  (1.575 m)   Wt 188 lb 6.4 oz (85.5 kg)   LMP  (LMP Unknown)   SpO2 96%   BMI 34.46 kg/m  Gen: awake, alert, NAD HEENT: anicteric, op clear CV: RRR, no mrg Pulm: CTA b/l Abd: soft, NT/ND, +BS throughout Ext: no c/c/e Neuro: nonfocal  CBC    Component Value Date/Time   WBC 6.4 04/01/2019 1131   RBC 3.98 04/01/2019 1131   HGB 11.3 (L) 04/01/2019 1131   HGB 11.4 (L) 06/12/2017 1005   HCT 33.6 (L) 04/02/2019 1035   HCT 34.0 (L) 06/12/2017 1005   PLT 281.0 04/01/2019 1131   PLT 218 06/12/2017 1005   MCV 85.1 04/01/2019 1131   MCV 82.9 06/12/2017 1005   MCH 28.2 02/06/2019 1112   MCHC 33.5 04/01/2019 1131   RDW 15.5 04/01/2019 1131   RDW 15.4 (H) 06/12/2017 1005   LYMPHSABS 1.1 04/01/2019 1131   LYMPHSABS 0.7 (L) 06/12/2017 1005   MONOABS 0.4 04/01/2019 1131   MONOABS 0.2 06/12/2017 1005   EOSABS 0.1 04/01/2019 1131   EOSABS 0.1  06/12/2017 1005   BASOSABS 0.0 04/01/2019 1131   BASOSABS 0.0 06/12/2017 1005   Iron/TIBC/Ferritin/ %Sat    Component Value Date/Time   IRON 75 04/02/2019 1035   TIBC 259 04/02/2019 1035   FERRITIN 83 04/02/2019 1035   IRONPCTSAT 29 04/02/2019 1035         Assessment & Plan:  78 year old female with a history of GERD, constipation, history of breast cancer, anemia, hypertension, who is seen in follow-up to evaluate constipation and also her anemia.  1. Constipation --longstanding.  I recommended she try MiraLAX on a daily basis 17 g.  I also discussed that this is not effective that we may try a prescription laxative such as Linzess or Amitiza.  She can use prune juice along with MiraLAX if needed but would give MiraLAX a shot on a daily basis before deeming it ineffective.  2.  CRC screening --screening colonoscopy recommended.  We discussed the risk, benefits and alternatives and she is agreeable and wishes to proceed  3.  Normocytic anemia --anemia is mild with no evidence of GI blood loss.  Her iron studies have been normal.  B12 normal.  We are proceeding with colonoscopy as a #2.  Anemia has been stable and  so we will monitor for now.  Does not need iron replacement at present.  25 minutes spent with the patient today. Greater than 50% was spent in counseling and coordination of care with the patient

## 2019-05-22 NOTE — Patient Instructions (Signed)
You have been scheduled for a colonoscopy. Please follow written instructions given to you at your visit today.  Please pick up your prep supplies at the pharmacy within the next 1-3 days. If you use inhalers (even only as needed), please bring them with you on the day of your procedure. Your physician has requested that you go to www.startemmi.com and enter the access code given to you at your visit today. This web site gives a general overview about your procedure. However, you should still follow specific instructions given to you by our office regarding your preparation for the procedure.  Please purchase the following medications over the counter and take as directed: Miralax 17 grams (1 capful) dissolved in 8 ounces water/juice once daily.   Please call our office if you do not notice any improvement in your symptoms after a couple weeks of miralax.  If you are age 27 or older, your body mass index should be between 23-30. Your Body mass index is 34.46 kg/m. If this is out of the aforementioned range listed, please consider follow up with your Primary Care Provider.  If you are age 68 or younger, your body mass index should be between 19-25. Your Body mass index is 34.46 kg/m. If this is out of the aformentioned range listed, please consider follow up with your Primary Care Provider.

## 2019-05-30 ENCOUNTER — Telehealth: Payer: Self-pay | Admitting: Family Medicine

## 2019-05-30 MED ORDER — BUSPIRONE HCL 10 MG PO TABS: 10.0000 mg | ORAL_TABLET | Freq: Three times a day (TID) | ORAL | 0 refills | Status: DC

## 2019-05-30 NOTE — Telephone Encounter (Signed)
May change to 10 mg 3 times a day buspirone since this may be more cost effective-thanks for looking into this!

## 2019-05-30 NOTE — Telephone Encounter (Signed)
Forwarding to Dr. Yong Channel to advise.   I noticed that [facility] has the 10 mg dose at a more affordable price FYI

## 2019-05-30 NOTE — Telephone Encounter (Signed)
Called pt and advised. Rx sent to [facility] with goodrx information.

## 2019-05-30 NOTE — Telephone Encounter (Signed)
Pt called in requesting to have a change to medication busPIRone (BUSPAR) 7.5 MG tablet. Pt says that medication is to expensive so she has been out for a while.   Pt would like further assistance with this.

## 2019-05-30 NOTE — Telephone Encounter (Signed)
See note

## 2019-06-04 ENCOUNTER — Other Ambulatory Visit: Payer: Self-pay | Admitting: Family Medicine

## 2019-06-04 MED ORDER — BUSPIRONE HCL 10 MG PO TABS: 10.0000 mg | ORAL_TABLET | Freq: Three times a day (TID) | ORAL | 0 refills | Status: DC

## 2019-06-04 NOTE — Telephone Encounter (Signed)
busPIRone (BUSPAR) 10 MG tablet  [facility]

## 2019-06-04 NOTE — Telephone Encounter (Signed)
Requested medication (s) are due for refill today: yes  Requested medication (s) are on the active medication list: yes  Last refill:  05/30/2019  Future visit scheduled: no  Notes to clinic:  Patient hasn't received script Not at the pharmacy   Requested Prescriptions  Pending Prescriptions Disp Refills   busPIRone (BUSPAR) 10 MG tablet 270 tablet 0    Sig: Take 1 tablet (10 mg total) by mouth 3 (three) times daily.     Psychiatry: Anxiolytics/Hypnotics - Non-controlled Passed - 06/04/2019  3:01 PM      Passed - Valid encounter within last 6 months    Recent Outpatient Visits          1 month ago Hyperlipidemia associated with type 2 diabetes mellitus ([place])   [facility] [place], Brayton Mars, MD   2 months ago Hyperlipidemia associated with type 2 diabetes mellitus [facility])   [place], Brayton Mars, MD   4 months ago Neck pain on left side   [place] [facility], Brayton Mars, MD   4 months ago Neck pain on left side   [facility] [place], Brayton Mars, MD   5 months ago Acute cystitis with hematuria   [place], Brayton Mars, MD      Future Appointments            In 2 months Yong Channel, Brayton Mars, MD [place], [facility]

## 2019-06-04 NOTE — Telephone Encounter (Signed)
See note

## 2019-06-10 ENCOUNTER — Ambulatory Visit: Payer: Medicare Other | Admitting: Psychology

## 2019-06-16 ENCOUNTER — Encounter: Payer: Medicare Other | Admitting: Internal Medicine

## 2019-06-16 ENCOUNTER — Ambulatory Visit (INDEPENDENT_AMBULATORY_CARE_PROVIDER_SITE_OTHER): Payer: Medicare Other | Admitting: Psychology

## 2019-06-16 DIAGNOSIS — F411 Generalized anxiety disorder: Secondary | ICD-10-CM

## 2019-06-16 DIAGNOSIS — F330 Major depressive disorder, recurrent, mild: Secondary | ICD-10-CM | POA: Diagnosis not present

## 2019-06-23 ENCOUNTER — Encounter: Payer: Medicare Other | Admitting: Internal Medicine

## 2019-06-25 ENCOUNTER — Other Ambulatory Visit: Payer: Self-pay

## 2019-06-25 ENCOUNTER — Ambulatory Visit
Admission: RE | Admit: 2019-06-25 | Discharge: 2019-06-25 | Disposition: A | Discharge status: Home / Self Care | Payer: Medicare Other | Source: Ambulatory Visit | Attending: Hematology | Admitting: Hematology

## 2019-06-25 ENCOUNTER — Other Ambulatory Visit: Payer: Self-pay | Admitting: Hematology

## 2019-06-25 DIAGNOSIS — C50412 Malignant neoplasm of upper-outer quadrant of left female breast: Secondary | ICD-10-CM

## 2019-06-25 DIAGNOSIS — Z170 Estrogen receptor positive status [ER+]: Secondary | ICD-10-CM

## 2019-06-25 DIAGNOSIS — N6489 Other specified disorders of breast: Secondary | ICD-10-CM | POA: Diagnosis not present

## 2019-06-25 DIAGNOSIS — R599 Enlarged lymph nodes, unspecified: Secondary | ICD-10-CM

## 2019-06-25 DIAGNOSIS — R928 Other abnormal and inconclusive findings on diagnostic imaging of breast: Secondary | ICD-10-CM | POA: Diagnosis not present

## 2019-07-01 ENCOUNTER — Ambulatory Visit
Admission: RE | Admit: 2019-07-01 | Discharge: 2019-07-01 | Disposition: A | Discharge status: Home / Self Care | Payer: Medicare Other | Source: Ambulatory Visit | Attending: Hematology | Admitting: Hematology

## 2019-07-01 ENCOUNTER — Other Ambulatory Visit: Payer: Self-pay | Admitting: General Practice

## 2019-07-01 ENCOUNTER — Other Ambulatory Visit: Payer: Self-pay

## 2019-07-01 DIAGNOSIS — R599 Enlarged lymph nodes, unspecified: Secondary | ICD-10-CM

## 2019-07-01 DIAGNOSIS — R590 Localized enlarged lymph nodes: Secondary | ICD-10-CM | POA: Diagnosis not present

## 2019-07-01 DIAGNOSIS — C761 Malignant neoplasm of thorax: Secondary | ICD-10-CM | POA: Diagnosis not present

## 2019-07-02 ENCOUNTER — Other Ambulatory Visit: Payer: Self-pay | Admitting: Family Medicine

## 2019-07-03 ENCOUNTER — Ambulatory Visit (INDEPENDENT_AMBULATORY_CARE_PROVIDER_SITE_OTHER): Payer: Medicare Other | Admitting: Psychology

## 2019-07-03 DIAGNOSIS — F411 Generalized anxiety disorder: Secondary | ICD-10-CM

## 2019-07-03 DIAGNOSIS — F330 Major depressive disorder, recurrent, mild: Secondary | ICD-10-CM

## 2019-07-03 NOTE — Telephone Encounter (Signed)
Last OV: 04/07/2019  Next OV: 08/12/2019  Last refill: 02/03/2019  Ok to refill Tramadol?

## 2019-07-04 ENCOUNTER — Telehealth: Payer: Self-pay | Admitting: Physical Therapy

## 2019-07-04 ENCOUNTER — Telehealth: Payer: Self-pay

## 2019-07-04 NOTE — Telephone Encounter (Signed)
Patient called concerned about finding out cancer in lymph node.  Spoke with Dr. Burr Medico plan to get patient in next week for labs and f/u visit.  I notified the patient.

## 2019-07-04 NOTE — Telephone Encounter (Signed)
Copied from [place] [id number]. Topic: General - Other >> Jul 04, 2019  9:16 AM Rayann Heman wrote: Reason for CRM: pt called and stated that she would like a call back from Dr Yong Channel. States states that she would like results from recent imaging

## 2019-07-04 NOTE — Telephone Encounter (Signed)
Pt was calling to get results from her cancer doctor because she had not heard from them. Pt states cancer center called after this message was received and she got her results. Pt thanked me for calling her back.

## 2019-07-07 ENCOUNTER — Telehealth: Payer: Self-pay | Admitting: Hematology

## 2019-07-07 NOTE — Telephone Encounter (Signed)
Scheduled appt per 10/2 sch message  Unable to reach pt . Left message with appt date and time

## 2019-07-10 ENCOUNTER — Other Ambulatory Visit: Payer: Self-pay

## 2019-07-10 ENCOUNTER — Inpatient Hospital Stay: Payer: Medicare Other

## 2019-07-10 ENCOUNTER — Telehealth: Payer: Self-pay | Admitting: Family Medicine

## 2019-07-10 ENCOUNTER — Inpatient Hospital Stay: Payer: Medicare Other | Attending: Hematology | Admitting: Nurse Practitioner

## 2019-07-10 VITALS — BP 140/68 | HR 80 | Temp 98.5°F | Resp 18 | Ht 62.0 in | Wt 195.2 lb

## 2019-07-10 DIAGNOSIS — E1165 Type 2 diabetes mellitus with hyperglycemia: Secondary | ICD-10-CM | POA: Insufficient documentation

## 2019-07-10 DIAGNOSIS — N183 Chronic kidney disease, stage 3 unspecified: Secondary | ICD-10-CM | POA: Diagnosis not present

## 2019-07-10 DIAGNOSIS — R110 Nausea: Secondary | ICD-10-CM | POA: Insufficient documentation

## 2019-07-10 DIAGNOSIS — E1122 Type 2 diabetes mellitus with diabetic chronic kidney disease: Secondary | ICD-10-CM | POA: Insufficient documentation

## 2019-07-10 DIAGNOSIS — R590 Localized enlarged lymph nodes: Secondary | ICD-10-CM | POA: Insufficient documentation

## 2019-07-10 DIAGNOSIS — Z170 Estrogen receptor positive status [ER+]: Secondary | ICD-10-CM | POA: Insufficient documentation

## 2019-07-10 DIAGNOSIS — I129 Hypertensive chronic kidney disease with stage 1 through stage 4 chronic kidney disease, or unspecified chronic kidney disease: Secondary | ICD-10-CM | POA: Insufficient documentation

## 2019-07-10 DIAGNOSIS — Z79811 Long term (current) use of aromatase inhibitors: Secondary | ICD-10-CM | POA: Insufficient documentation

## 2019-07-10 DIAGNOSIS — Z794 Long term (current) use of insulin: Secondary | ICD-10-CM | POA: Diagnosis not present

## 2019-07-10 DIAGNOSIS — C50412 Malignant neoplasm of upper-outer quadrant of left female breast: Secondary | ICD-10-CM | POA: Diagnosis not present

## 2019-07-10 DIAGNOSIS — D649 Anemia, unspecified: Secondary | ICD-10-CM | POA: Insufficient documentation

## 2019-07-10 DIAGNOSIS — D493 Neoplasm of unspecified behavior of breast: Secondary | ICD-10-CM

## 2019-07-10 DIAGNOSIS — Z923 Personal history of irradiation: Secondary | ICD-10-CM | POA: Insufficient documentation

## 2019-07-10 LAB — CBC WITH DIFFERENTIAL/PLATELET
Abs Immature Granulocytes: 0.02 10*3/uL (ref 0.00–0.07)
Basophils Absolute: 0 10*3/uL (ref 0.0–0.1)
Basophils Relative: 0 %
Eosinophils Absolute: 0.1 10*3/uL (ref 0.0–0.5)
Eosinophils Relative: 2 %
HCT: 31.3 % — ABNORMAL LOW (ref 36.0–46.0)
Hemoglobin: 10.6 g/dL — ABNORMAL LOW (ref 12.0–15.0)
Immature Granulocytes: 0 %
Lymphocytes Relative: 17 %
Lymphs Abs: 1.1 10*3/uL (ref 0.7–4.0)
MCH: 27.7 pg (ref 26.0–34.0)
MCHC: 33.9 g/dL (ref 30.0–36.0)
MCV: 81.9 fL (ref 80.0–100.0)
Monocytes Absolute: 0.4 10*3/uL (ref 0.1–1.0)
Monocytes Relative: 6 %
Neutro Abs: 5 10*3/uL (ref 1.7–7.7)
Neutrophils Relative %: 75 %
Platelets: 243 10*3/uL (ref 150–400)
RBC: 3.82 MIL/uL — ABNORMAL LOW (ref 3.87–5.11)
RDW: 14.8 % (ref 11.5–15.5)
WBC: 6.8 10*3/uL (ref 4.0–10.5)
nRBC: 0 % (ref 0.0–0.2)

## 2019-07-10 LAB — COMPREHENSIVE METABOLIC PANEL
ALT: <6 U/L (ref 0–44)
AST: 9 U/L — ABNORMAL LOW (ref 15–41)
Albumin: 3.5 g/dL (ref 3.5–5.0)
Alkaline Phosphatase: 92 U/L (ref 38–126)
Anion gap: 9 (ref 5–15)
BUN: 20 mg/dL (ref 8–23)
CO2: 29 mmol/L (ref 22–32)
Calcium: 9.1 mg/dL (ref 8.9–10.3)
Chloride: 103 mmol/L (ref 98–111)
Creatinine, Ser: 1.38 mg/dL — ABNORMAL HIGH (ref 0.44–1.00)
GFR calc Af Amer: 42 mL/min — ABNORMAL LOW (ref >60)
GFR calc non Af Amer: 37 mL/min — ABNORMAL LOW (ref >60)
Glucose, Bld: 233 mg/dL — ABNORMAL HIGH (ref 70–99)
Potassium: 4.2 mmol/L (ref 3.5–5.1)
Sodium: 141 mmol/L (ref 135–145)
Total Bilirubin: 0.2 mg/dL — ABNORMAL LOW (ref 0.3–1.2)
Total Protein: 6.9 g/dL (ref 6.5–8.1)

## 2019-07-10 NOTE — Progress Notes (Addendum)
[place]   [phone] [phone]   Clinic Follow up Note   Patient Care Team: Marin Olp, MD as PCP - General (Family Medicine) Philemon Kingdom, MD as Consulting Physician (Internal Medicine) Jalene Mullet, MD as Consulting Physician (Ophthalmology) Erroll Luna, MD as Consulting Physician (General Surgery) Kyung Rudd, MD as Consulting Physician (Radiation Oncology) [name], MD as Consulting Physician (Hematology) Gardenia Phlegm, NP as Nurse Practitioner (Hematology and Oncology) Date of Service: 07/10/2019   CHIEF COMPLAINT: f/u left breast cancer    SUMMARY OF ONCOLOGIC HISTORY: Oncology History Overview Note  Cancer Staging Malignant neoplasm of upper-outer quadrant of left breast in female, estrogen receptor positive ([place]) Staging form: Breast, AJCC 8th Edition - Clinical: Stage IIB (cT2, cN0, cM0, G3, ER: Positive, PR: Negative, HER2: Negative) - Signed by [name], MD on 11/23/2016 - Pathologic stage from 12/28/2016: Stage IIB (pT2, pN1a(sn), cM0, G3, ER: Positive, PR: Negative, HER2: Negative) - Signed by [name], MD on 01/02/2017     Malignant neoplasm of upper-outer quadrant of left breast in female, estrogen receptor positive ([place])  11/01/2016 Mammogram   Diagnostic mammogram and ultrasound of left breast and axilla showed a 3.1 x 2.1 x 1.4 cm (3.3 x 2.0 x 2.7 cm by ultrasound) mass in the upper outer quadrant of the anterior third of the left breast, associated with pleomorphic calcification. There is a 8 mm (1.8cm by [name]) prominent lymph node in the left axilla.   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   11/07/2016 Receptors her2   ER 80% positive, PR negative, HER-2 negative, Ki-67 90%   11/20/2016 Initial Diagnosis   Malignant neoplasm of upper-outer quadrant of left breast in female, estrogen receptor  positive ([place])   12/07/2016 Surgery   Left lumpectomy and left axillary sentinel lymph node sampling by Dr. Brantley Stage   12/07/2016 Pathology Results   pT2, pN1 Left Lumpectomy: Grade 3 IDC measuring 3.4 cm, carcinoma broadly present at the superior margin. Grade 3 DCIS. 1 out of 2 left axillary SLN positive for metastatic carcinoma   12/07/2016 Miscellaneous   Mammaprint showed high risk disease, basal type    12/28/2016 Surgery   Re-excision of the previously positive superior margin was negative for malignant cells.    01/04/2017 Surgery   Port inserted   01/18/2017 - 03/22/2017 Chemotherapy   Adjuvant Docetaxel 75 mg/m and Cytoxan 600 mg/m, every 21 days, for total of 4 cycles, with Neulasta on day 2.    04/23/2017 - 05/18/2017 Radiation Therapy   Radiation treatment dates:   04/23/17 - 05/18/17 Administered by Dr. Lisbeth Renshaw  Site/dose:    Left breast/ 42.5 Gy in 17 Fx Boost / 7.5 Gy in 3 Fx   06/12/2017 -  Anti-estrogen oral therapy   Adjuvant letrozole 1 mg daily, plan for 5-7 years    07/20/2017 Mammogram   IMPRESSION: No mammographic evidence of malignancy in either breast. 3.8 cm left breast postsurgical loculated seroma.   09/12/2017 Survivorship     09/19/2018 Imaging   Baseline DEXA 09/19/18 ASSESSMENT: The BMD measured at Femur Neck Left is 0.888 g/cm2 with a T-score of -1.1. This patient is considered osteopenic according to [facility]) criteria.   The scan quality is good. L-4 was excluded due to degenerative changes.   Site Region Measured Date Measured Age YA BMD Significant CHANGE T-score AP Spine  L1-L3  09/19/2018    77.2         -0.7    1.084 g/cm2   DualFemur Neck Left  09/19/2018    77.2         -1.1    0.888 g/cm2   DualFemur Total Mean 09/19/2018    77.2         -0.5    0.946 g/cm2 ASSESSMENT: The probability of a major osteoporotic fracture is 4.5 % within the next ten years.   The probability of hip fracture is 0.8  % within  the next 10 years.   06/25/2019 Mammogram   IMPRESSION: 1.  Two morphologically abnormal lymph nodes in the left axilla.   2. Stable lumpectomy changes left breast with no findings of malignancy in either breast.   RECOMMENDATION: Recommend ultrasound-guided core biopsy of one in the morphologically abnormal lymph nodes in the left axilla. This is being scheduled for the patient.   06/25/2019 Breast US   Targeted ultrasound of the left axilla was performed. There are 2 morphologically abnormal lymph nodes in the left axilla, with a lymph node in the lower left axilla measuring 2.4 x 2 x 2.6 cm.     07/01/2019 Pathology Results   Diagnosis Lymph node, needle/core biopsy, left axilla - POORLY DIFFERENTIATED CARCINOMA CONSISTENT WITH BREAST PRIMARY. - NO LYMPHOID NODAL TISSUE IDENTIFIED. - SEE MICROSCOPIC DESCRIPTION. Microscopic Comment The carcinoma is consistent with grade III.  PROGNOSTIC INDICATORS Results: IMMUNOHISTOCHEMICAL AND MORPHOMETRIC ANALYSIS PERFORMED MANUALLY The tumor cells are NEGATIVE for Her2 (1+). Estrogen Receptor: 80%, POSITIVE, WEAK STAINING INTENSITY Progesterone Receptor: 0%, NEGATIVE     CURRENT THERAPY: adjuvant letrozole, started 06/12/17   INTERVAL HISTORY: Ms. Craigo returns for f/u as scheduled. She was last seen for routine f/u on 04/02/19. She had outside imaging in the interval that demonstrated morphologically abnormal left axillary lymph nodes. She underwent diagnostic mammogram and [name] on 07/01/19 that showed 2 abnormal nodes in the left axilla, measuring 2x4 x 2 x 2.6 cm in the lower aspect. [name] core biopsy revealed poorly differentiated carcinoma consistent with breast primary, ER+/PR-/HER2-, grade III.   Ms. Loa presents by herself today. She feels well, has some soreness from left axillary biopsy. Denies concerns or new lump in her breast. She reports 3 month history of somewhat limited ROM in her left arm, tingling from her neck to fingers,  mild weakness, and shoulder pain. She thinks she had an MRI of her shoulder recently but can't remember who ordered it or where it was done. She remains functional and can complete all ADLs. Denies headache, vision change, or dizziness unless she stands too quickly. Appetite is normal for her. Has mild nausea she attributes to intermittent constipation and bloating. Usually relieved with metamucil and prunes. Last BM 2 days ago. Denies recent fever, chills, cough, chest pain, dyspnea, or leg swelling. She has started talking with a therapist which is helping her with grief over husband's passing last year. DM is "out of control," usually 200-300 range.    MEDICAL HISTORY:  Past Medical History:  Diagnosis Date  . Abnormal breast finding 2018   per pt/ having  a lot drainage from left breast nipple  . Anemia   . Anxiety   . Breast cancer ([place]) 11/2016   left/  . CKD (chronic kidney disease)   . Depression   . GERD (gastroesophageal reflux disease)    TUMS as needed  . HTN (hypertension)    states BP has been high recently;  has been on med. x 20 yr.  . Hyperlipidemia   . Hyperplastic colon polyp   . HYPERTENSION, BENIGN SYSTEMIC 11/29/2006   Lisinopril hctz 10-12.75m, amlodipine 181m coreg 2537mID, clonidine 0.3 mg BID.   . IMarland Kitchensulin dependent diabetes mellitus ([facility] . Personal history of chemotherapy    2018/finished 6 weeks of chemo in Sep 2018  . Personal history of radiation therapy    2018 left breast/finished radiation in Sept 2018 per pt.    SURGICAL HISTORY: Past Surgical History:  Procedure Laterality Date  . BREAST LUMPECTOMY Left 05/05/2008  . BREAST LUMPECTOMY Left 12/07/2016   malignant  . BREAST LUMPECTOMY WITH RADIOACTIVE SEED AND SENTINEL LYMPH NODE BIOPSY Left 12/07/2016   Procedure: LEFT BREAST LUMPECTOMY WITH RADIOACTIVE SEED AND SENTINEL LYMPH NODE BIOPSY;  Surgeon: ThoErroll LunaD;  [facility];  Laterality: Left;  .  IR FLUORO GUIDE PORT INSERTION RIGHT  01/04/2017  . IR US [place]IDE VASC ACCESS RIGHT  01/04/2017  . PORT-A-CATH REMOVAL N/A 05/30/2017   Procedure: REMOVAL PORT-A-CATH;  Surgeon: CorErroll LunaD;  [facility];  Laterality: N/A;  . RE-EXCISION OF BREAST LUMPECTOMY Left 12/28/2016   Procedure: RE-EXCISION OF BREAST LUMPECTOMY;  Surgeon: ThoErroll LunaD;  [facility];  Laterality: Left;    I have reviewed the social history and family history with the patient and they are unchanged from previous note.  ALLERGIES:  has No Known Allergies.  MEDICATIONS:  Current Outpatient Medications  Medication Sig Dispense Refill  . amLODipine (NORVASC) 10 MG tablet TAKE 1 TABLET BY MOUTH EVERY DAY 90 tablet 1  . aspirin 81 MG tablet Take 81 mg by mouth daily.      . B-D INS SYRINGE 0.5CC/31GX5/16 31G X 5/16" 0.5 ML MISC USE AS DIRECTED 4 TIMES A DAY 300 each 1  . busPIRone (BUSPAR) 10 MG tablet Take 1 tablet (10 mg total) by mouth 3 (three) times daily. 270 tablet 0  . carvedilol (COREG) 25 MG tablet Take 1 tablet (25 mg total) by mouth 2 (two) times daily with a meal. 180 tablet 2  . cloNIDine (CATAPRES) 0.3 MG tablet TAKE 1 TABLET (0.3 MG TOTAL) BY MOUTH 2 (TWO) TIMES DAILY. 180 tablet 1  . Insulin Glargine (LANTUS SOLOSTAR) 100 UNIT/ML Solostar Pen Inject 24 units into the skin 10 pen 5  . Insulin Pen Needle 31G X 5 MM MISC Use pen needles for insulin injection daily 100 each 3  . insulin regular (NOVOLIN R,HUMULIN R) 100 units/mL injection Inject 0.1-0.16 mLs (10-16 Units total) into the skin 3 (three) times daily before meals. Relion. Please provide insulin syringes needed 20 mL 11  . Insulin Syringe-Needle U-100 (B-D INS SYRINGE 0.5CC/30GX1/2") 30G X 1/2" 0.5 ML MISC USE AS DIRECTED 4 TIMES A DAY 300 each 3  . letrozole (FEMARA) 2.5 MG tablet TAKE 1 TABLET BY MOUTH EVERY DAY 90 tablet 1  . lisinopril-hydrochlorothiazide (ZESTORETIC)  10-12.5 MG tablet TAKE 1 TABLET BY MOUTH EVERY DAY 90 tablet 3  . lovastatin (MEVACOR) 40 MG tablet Take 1 tablet (40 mg total) by mouth at bedtime. 90 tablet 3  . metFORMIN (GLUCOPHAGE) 500 MG tablet TAKE 2 TABLETS BY MOUTH DAILY WITH SUPPER. 180 tablet 0  . OVER THE COUNTER MEDICATION Take 1 tablet by mouth daily as needed. Stool softner for constipation, also drinks warm prune juice prn    . polyethylene glycol powder (GLYCOLAX/MIRALAX)  17 GM/SCOOP powder Take 17 g by mouth daily. 1 g 0  . SUPREP BOWEL PREP KIT 17.5-3.13-1.6 GM/177ML SOLN Take 1 kit by mouth as directed. For colonoscopy prep 354 mL 0  . traMADol (ULTRAM) 50 MG tablet TAKE 1 TABLET BY MOUTH EVERY 6 (SIX) HOURS AS NEEDED FOR MODERATE PAIN OR SEVERE PAIN (NECK AND SHOULDER PAIN). 20 tablet 0   No current facility-administered medications for this visit.     PHYSICAL EXAMINATION: ECOG PERFORMANCE STATUS: 1 - Symptomatic but completely ambulatory  Vitals:   07/10/19 1059  BP: 140/68  Pulse: 80  Resp: 18  Temp: 98.5 F (36.9 C)  SpO2: 100%   Filed Weights   07/10/19 1059  Weight: 195 lb 3.2 oz (88.5 kg)    GENERAL:alert, no distress and comfortable SKIN: no rash  EYES:  sclera clear NECK: mild left side neck pain, no palpable abnormality  LYMPH:  no palpable cervical or supraclavicular lymphadenopathy LUNGS: clear with normal breathing effort HEART: regular rate & rhythm, no lower extremity edema ABDOMEN: abdomen soft, non-tender and normal bowel sounds Musculoskeletal:no cyanosis of digits. LUE lymphedema, mildly limited ROM  NEURO: alert & oriented x 3 with fluent speech. Mild left upper arm weakness, vibratory sense intact.  Breast: s/p left lumpectomy. Palpable 3 cm LN in low axillary tail, no other palpable mass in either breast that I could appreciate   LABORATORY DATA:  I have reviewed the data as listed CBC Latest Ref Rng & Units 07/10/2019 04/02/2019 04/01/2019  WBC 4.0 - 10.5 K/uL 6.8 - 6.4  Hemoglobin  12.0 - 15.0 g/dL 10.6(L) - 11.3(L)  Hematocrit 36.0 - 46.0 % 31.3(L) 33.6(L) 33.9(L)  Platelets 150 - 400 K/uL 243 - 281.0     CMP Latest Ref Rng & Units 07/10/2019 04/01/2019 02/06/2019  Glucose 70 - 99 mg/dL 233(H) 312(H) 276(H)  BUN 8 - 23 mg/dL _0 Creatinine 0.44 - 1.00 mg/dL 1.38(H) 1.30(H) 1.01(H)  Sodium 135 - 145 mmol/L 141 136 137  Potassium 3.5 - 5.1 mmol/L 4.2 4.2 4.6  Chloride 98 - 111 mmol/L 103 98 102  CO2 22 - 32 mmol/L 29 29 21(L)  Calcium 8.9 - 10.3 mg/dL 9.1 9.3 9.3  Total Protein 6.5 - 8.1 g/dL 6.9 6.7 -  Total Bilirubin 0.3 - 1.2 mg/dL 0.2(L) 0.3 -  Alkaline Phos 38 - 126 U/L 92 95 -  AST 15 - 41 U/L 9(L) 7 -  ALT 0 - 44 U/L <6 5 -      RADIOGRAPHIC STUDIES: I have personally reviewed the radiological images as listed and agreed with the findings in the report. No results found.   ASSESSMENT & PLAN: Mrs. Jeffrey is a3 y.o.female who presented with a self palpable left breast mass  1. Malignant neoplasm of upper-outer quadrant of left breast, Invasive Ductal Carcinoma, pT2pN1M0, stage IIB, ER+/PR-/HER2-, G3, mammaprint high risk  -S/p lumpectomy and reexcision for positive margins, mammaprint showed high risk, basal type. She completed adjuvant chemotherapy with TC x4 cycles (12/2016-03/2017) followed by radiation per Dr. Lisbeth Renshaw from 04/2017 - 05/2017.  -She began adjuvant letrozole in 06/2017. She is tolerating very well overall without appreciable side effects. Plan to continue for total 7 years if she tolerates well.  -she was found to have abnormal LN on imaging that was done to evaluate left arm pain and numbness. She underwent diagnostic mammogram and biopsy which shows ER+/PR/HER2- tumor. She has a  Palpable 3 cm node in the axillary tail  -This  likely represents local recurrence from her previous left breast cancer rather than a new primary  -Dr. Burr Medico recommends to proceed with staging work up including breast MRI to r/o recurrence in the left breast and  bilateral disease in the right. She will also undergo PET or CT/bone scan, which ever her insurance approves.  -If she has localized disease, we reviewed the only way to cure this is surgical resection which will likely include axillary node dissection. She has mild LE already, and will be at risk for further LE and would likely need more post-op PT and compression sleeve.  -Her case was reviewed in multidisciplinary breast conference; she has not met with Dr. Brantley Stage yet to discuss surgical options  -she has already had RT to the right axilla, she would not be a candidate for additional radiation to that area.  -Due to her age and co-morbidities, she is not a strong candidate for more chemotherapy. Dr. Burr Medico will likely offer her AI with exemestane and observe her closely. -Labs reviewed, CA 27.29 is normal; otherwise stable  -We will see her back after surgery to review final pathology and finalize her treatment plan.   2. H/o breast cellulitis in 2019 -followed by Dr. Brantley Stage; s/p fluid aspiration and antibiotics -She missed her mammogram in 04/2018 due to seroma and cellulitis, she deferred the visit due to draining breast, pain, and frequent visits related to this   3. Left arm numbness/tingling, weakness and shoulder pain -developed 3 months ago -worked up by Dr. Yong Channel -sensation and vibratory sense intact, she has mild weakness and ROM limitation -patient think she had a recent CT or MRI to evaluate, I do not have the report but have requested my nurse to call Dr. Ansel Bong office to obtain -I don't think this is neuropathy related to her previous chemo, could be related to large axillary LN or previous left breast surgery   4. HTN, DM, CKD III -stable -F/u with PCP   5. Mild anemia  -mild, fluctuates -Denies GI or other bleeding  -Hgb 10.6 today -Iron and TIBC, B12, MMA normal -She has colonoscopy scheduled with Dr. Hilarie Fredrickson next week for [facility] screening and evaluation of her anemia    PLAN: -Labs, biopsy reviewed -Breast MRI and PET to complete work up and staging, will call with results -Patient to see Dr. Donella Stade to discuss surgical option -F/u after surgery to review final path and discuss treatment  Orders Placed This Encounter  Procedures  . MR BREAST BILATERAL W WO CONTRAST INC CAD    Standing Status:   Future    Standing Expiration Date:   09/08/2020    Order Specific Question:   ** REASON FOR EXAM (FREE TEXT)    Answer:   recurrence in left axilla    Order Specific Question:   If indicated for the ordered procedure, I authorize the administration of contrast media per Radiology protocol    Answer:   Yes    Order Specific Question:   What is the patient's sedation requirement?    Answer:   No Sedation    Order Specific Question:   Does the patient have a pacemaker or implanted devices?    Answer:   No    Order Specific Question:   Radiology Contrast Protocol - do NOT remove file path    Answer:   \\[email or web address]    Order Specific Question:   Preferred imaging location?    Answer:   [facility] (table limit-350 lbs)  .  NM PET Image Initial (PI) Skull Base To Thigh    Standing Status:   Future    Standing Expiration Date:   07/09/2020    Order Specific Question:   ** REASON FOR EXAM (FREE TEXT)    Answer:   recurrence in the axilla, r/o metastatic disease    Order Specific Question:   If indicated for the ordered procedure, I authorize the administration of a radiopharmaceutical per Radiology protocol    Answer:   Yes    Order Specific Question:   Preferred imaging location?    Answer:   [facility]    Order Specific Question:   Radiology Contrast Protocol - do NOT remove file path    Answer:   \\[email or web address]   All questions were answered. The patient knows to call the clinic with any problems, questions or concerns. No barriers to learning was detected.    Alla Feeling, NP  07/11/19   Addendum  I have seen the patient, examined her. I agree with the assessment and and plan and have edited the notes.   Danisa has developed some neck pain and left arm numbness, her workup showed left axillary adenopathy. I reviewed her biopsy results, which unfortunately showed breast cancer.  This is likely local recurrence from her previous left breast cancer.  ultrasound and mammogram was negative for a new primary in the left breast.  I will obtain a breast MRI to rule out a new primary.  I recommend new staging scan PET to ruled out distant metastasis.  If PET negative, she will undergo left axillary lymph node dissection, she is going to see her surgeon Dr. Brantley Stage soon. She previously had left breast and axillary radiation in 2018, not a candidate for more radiation. Given her age and medical comorbidities, I think she is a poor candidate for chemotherapy. Will change her antiestrogen therapy to exemestane, will check her tumor for estrogen receptor mutation. I plan to see her back after her surgery, unless her PET scan showed metastatic disease.  All questions were answered, I also discussed the above with her daughter over the phone during her visit.  Patient agrees with the plan.  [name] 07/10/2019

## 2019-07-10 NOTE — Telephone Encounter (Signed)
Possibly imaging was done at [facility]- would have them contact [facility] directly for records-looks like we did a referral several months ago

## 2019-07-10 NOTE — Telephone Encounter (Signed)
See note

## 2019-07-10 NOTE — Telephone Encounter (Signed)
I don't see any of the below imaging ordered by you other than the left axilla. Please advise.

## 2019-07-10 NOTE — Telephone Encounter (Signed)
[name] calling  from [employer] on behalf of [facility] Nurse Practicor, Supervising MD Dr Burr Medico.  Requesting imaging results of Left arm and neck CT or MRI. Date of Service is unknown.  CB- [id number] CB- [phone]

## 2019-07-11 ENCOUNTER — Telehealth: Payer: Self-pay | Admitting: Nurse Practitioner

## 2019-07-11 ENCOUNTER — Encounter: Payer: Self-pay | Admitting: Nurse Practitioner

## 2019-07-11 LAB — CANCER ANTIGEN 27.29: CA 27.29: 17.5 U/mL (ref 0.0–38.6)

## 2019-07-11 NOTE — Telephone Encounter (Signed)
Called and lm on Wendy [name]'s nurse vm tcb regarding below message.

## 2019-07-11 NOTE — Telephone Encounter (Signed)
No los per 10/8.

## 2019-07-17 ENCOUNTER — Ambulatory Visit (INDEPENDENT_AMBULATORY_CARE_PROVIDER_SITE_OTHER): Payer: Medicare Other | Admitting: Psychology

## 2019-07-17 DIAGNOSIS — F330 Major depressive disorder, recurrent, mild: Secondary | ICD-10-CM | POA: Diagnosis not present

## 2019-07-17 DIAGNOSIS — F411 Generalized anxiety disorder: Secondary | ICD-10-CM | POA: Diagnosis not present

## 2019-07-18 ENCOUNTER — Ambulatory Visit (HOSPITAL_COMMUNITY)
Admission: RE | Admit: 2019-07-18 | Discharge: 2019-07-18 | Disposition: A | Discharge status: Home / Self Care | Payer: Medicare Other | Source: Ambulatory Visit | Attending: Nurse Practitioner | Admitting: Nurse Practitioner

## 2019-07-18 ENCOUNTER — Other Ambulatory Visit: Payer: Self-pay

## 2019-07-18 ENCOUNTER — Telehealth: Payer: Self-pay | Admitting: Family Medicine

## 2019-07-18 ENCOUNTER — Telehealth: Payer: Self-pay | Admitting: Internal Medicine

## 2019-07-18 DIAGNOSIS — Z853 Personal history of malignant neoplasm of breast: Secondary | ICD-10-CM | POA: Diagnosis not present

## 2019-07-18 DIAGNOSIS — D493 Neoplasm of unspecified behavior of breast: Secondary | ICD-10-CM | POA: Insufficient documentation

## 2019-07-18 MED ORDER — GADOBUTROL 1 MMOL/ML IV SOLN
9.0000 mL | Freq: Once | INTRAVENOUS | Status: AC | PRN
  Administered 2019-07-18: 9 mL via INTRAVENOUS

## 2019-07-18 NOTE — Telephone Encounter (Signed)
Patient scheduled 07/29/2019 to discuss not sleeping at night.  Asking if Dr. Yong Channel could see her sooner. Patient states that she is also taking cancer treatments. And is wanting some advice regarding sleep. Please advice.

## 2019-07-18 NOTE — Telephone Encounter (Signed)
See below

## 2019-07-20 ENCOUNTER — Other Ambulatory Visit: Payer: Self-pay | Admitting: Internal Medicine

## 2019-07-21 ENCOUNTER — Telehealth: Payer: Self-pay | Admitting: Internal Medicine

## 2019-07-21 ENCOUNTER — Ambulatory Visit: Payer: Self-pay | Admitting: Surgery

## 2019-07-21 ENCOUNTER — Encounter: Payer: Medicare Other | Admitting: Internal Medicine

## 2019-07-21 DIAGNOSIS — C50912 Malignant neoplasm of unspecified site of left female breast: Secondary | ICD-10-CM | POA: Diagnosis not present

## 2019-07-21 DIAGNOSIS — Z853 Personal history of malignant neoplasm of breast: Secondary | ICD-10-CM | POA: Diagnosis not present

## 2019-07-21 NOTE — Telephone Encounter (Signed)
A SDA slot can be used if there are any prior to her original appointment date.

## 2019-07-21 NOTE — H&P (Signed)
Wendy [name] Documented: 07/21/2019 8:55 AM Location: [facility] Patient #: [id number] DOB: 09/08/1941 Married / Language: English / Race: Black or African American Female  History of Present Illness (Wendy A. Cornett MD; 07/21/2019 11:18 AM) Patient words: Patient presents for follow-up of recurrent left breast cancer. She underwent lumpectomy with sentinel lymph node mapping in 2018 for left breast cancer. She's had a recurrence in her left axilla and this was noted by exam, mammography and subsequent core biopsy which shows a poor differentiated left axillary cancer suspicious for breast primary. Magnetic resonance imaging reveals no intra-breast recurrence. She is scheduled for a PET scan this week is here to discuss completion left axillary lymph node dissection if her PET is negative. She is not a good candidate for intravenous chemotherapy and we have managed with aromatase inhibitor. She has some soreness at her left axillary biopsy site.     77-year-old female with left breast cancer post lumpectomy 12/07/2016. A chest CT was reviewed and noted to demonstrate a morphologically abnormal lymph node in the left axilla.  EXAM: DIGITAL DIAGNOSTIC BILATERAL MAMMOGRAM WITH CAD AND TOMO  ULTRASOUND LEFT AXILLA  COMPARISON: Previous exam(s).  ACR Breast Density Category b: There are scattered areas of fibroglandular density.  FINDINGS: No suspicious masses or calcifications are seen in either breast. An initially questioned asymmetry seen in the right breast on the MLO view only resolves on the additional imaging with findings compatible with an area of overlapping fibroglandular tissue. Lumpectomy changes are noted within the upper outer posterior left breast, stable in appearance.  Mammographic images were processed with CAD.  Targeted ultrasound of the left axilla was performed. There are 2 morphologically abnormal lymph nodes in the left axilla, with  a lymph node in the lower left axilla measuring 2.4 x 2 x 2.6 cm.  IMPRESSION: 1. Two morphologically abnormal lymph nodes in the left axilla.  2. Stable lumpectomy changes left breast with no findings of malignancy in either breast.  RECOMMENDATION: Recommend ultrasound-guided core biopsy of one in the morphologically abnormal lymph nodes in the left axilla. This is being scheduled for the patient.  I have discussed the findings and recommendations with the patient. If applicable, a reminder letter will be sent to the patient regarding the next appointment.  BI-RADS CATEGORY 4: Suspicious.   Electronically Signed By: Wendy [name] M.D. On: 06/25/2019 14:23       Diagnosis Lymph node, needle/core biopsy, left axilla - POORLY DIFFERENTIATED CARCINOMA CONSISTENT WITH BREAST PRIMARY. - NO LYMPHOID NODAL TISSUE IDENTIFIED. - SEE MICROSCOPIC DESCRIPTION. Microscopic Comment The carcinoma is consistent with grade III. Estrogen receptor, progesterone receptor and Her2/neu will be performed. Wendy [name] agrees. Called to [facility] and Wendy [name] on 07/02/19. (JDP:ah 07/02/19) Wendy [name].  The patient is a 78 year old female.   Allergies (Wendy [name], CMA; 07/21/2019 8:55 AM) No Known Allergies [11/13/2016]: Allergies Reconciled    Vitals (Wendy [name] CMA; 07/21/2019 8:56 AM) 07/21/2019 8:55 AM Weight: 194.4 lb Height: 62in Body Surface Area: 1.89 m Body Mass Index: 35.56 kg/m  Temp.: 98.3F(Oral)  Pulse: 98 (Regular)  BP: 136/82 (Sitting, Left Arm, Standard)        Physical Exam (Wendy A. Cornett MD; 07/21/2019 11:15 AM)  General Mental Status-Alert. General Appearance-Consistent with stated age. Hydration-Well hydrated. Voice-Normal.  Head and Neck Head-normocephalic, atraumatic with no lesions or palpable masses. Trachea-midline. Thyroid Gland Characteristics - normal size and  consistency.  Chest and Lung Exam Chest and lung exam   reveals -quiet, even and easy respiratory effort with no use of accessory muscles and on auscultation, normal breath sounds, no adventitious sounds and normal vocal resonance. Inspection Chest Wall - Normal. Back - normal.  Breast Note: Left axilla shows mild adenopathy. No lymphedema. no breast mass bilaterally healed scar left breast  Cardiovascular Cardiovascular examination reveals -normal heart sounds, regular rate and rhythm with no murmurs and normal pedal pulses bilaterally.  Neurologic Neurologic evaluation reveals -alert and oriented x 3 with no impairment of recent or remote memory. Mental Status-Normal.  Musculoskeletal Normal Exam - Left-Upper Extremity Strength Normal and Lower Extremity Strength Normal. Normal Exam - Right-Upper Extremity Strength Normal and Lower Extremity Strength Normal.  Lymphatic Head & Neck  General Head & Neck Lymphatics: Bilateral - Description - Normal. Axillary  General Axillary Region: Bilateral - Description - Normal. Tenderness - Non Tender.    Assessment & Plan (Wendy A. Cornett MD; 07/21/2019 11:17 AM)  HISTORY OF BREAST CANCER (Z85.3) Impression: Patient recurrent disease left axilla. Recommend completion left axillary lymph node dissection. She can have no further radiation therapy to the side and is not a good chemotherapy candidate. Her current disease is ER positive .  Discussed the procedure. Risk of bleeding, infection, lymphedema risk is roughly 45% or higher, numbness, pain, injury to major underlying blood vessels and/or nerves  she agrees to proceed   BREAST CANCER, LEFT (C50.912)  Current Plans The anatomy and the physiology was discussed. The pathophysiology and natural history of the disease was discussed. Options were discussed and recommendations were made. Technique, risks, benefits, & alternatives were discussed. Risks such as stroke,  heart attack, bleeding, indection, death, and other risks discussed. Questions answered. The patient agrees to proceed. Pt Education - [facility] Education - Written Instructions given: discussed with patient and provided information. Pt Education - [facility] General Post-op HCI 

## 2019-07-21 NOTE — H&P (View-Only) (Signed)
Wendy [name] Documented: 07/21/2019 8:55 AM Location: [facility] Patient #: [id number] DOB: 09/01/1941 Married / Language: English / Race: Black or African American Female  History of Present Illness (Wendy [name] A. Wendy Henton MD; 07/21/2019 11:18 AM) Patient words: Patient presents for follow-up of recurrent left breast cancer. She underwent lumpectomy with sentinel lymph node mapping in 2018 for left breast cancer. She's had a recurrence in her left axilla and this was noted by exam, mammography and subsequent core biopsy which shows a poor differentiated left axillary cancer suspicious for breast primary. Magnetic resonance imaging reveals no intra-breast recurrence. She is scheduled for a PET scan this week is here to discuss completion left axillary lymph node dissection if her PET is negative. She is not a good candidate for intravenous chemotherapy and we have managed with aromatase inhibitor. She has some soreness at her left axillary biopsy site.     77-year-old female with left breast cancer post lumpectomy 12/07/2016. A chest CT was reviewed and noted to demonstrate a morphologically abnormal lymph node in the left axilla.  EXAM: DIGITAL DIAGNOSTIC BILATERAL MAMMOGRAM WITH CAD AND TOMO  ULTRASOUND LEFT AXILLA  COMPARISON: Previous exam(s).  ACR Breast Density Category b: There are scattered areas of fibroglandular density.  FINDINGS: No suspicious masses or calcifications are seen in either breast. An initially questioned asymmetry seen in the right breast on the MLO view only resolves on the additional imaging with findings compatible with an area of overlapping fibroglandular tissue. Lumpectomy changes are noted within the upper outer posterior left breast, stable in appearance.  Mammographic images were processed with CAD.  Targeted ultrasound of the left axilla was performed. There are 2 morphologically abnormal lymph nodes in the left axilla, with  a lymph node in the lower left axilla measuring 2.4 x 2 x 2.6 cm.  IMPRESSION: 1. Two morphologically abnormal lymph nodes in the left axilla.  2. Stable lumpectomy changes left breast with no findings of malignancy in either breast.  RECOMMENDATION: Recommend ultrasound-guided core biopsy of one in the morphologically abnormal lymph nodes in the left axilla. This is being scheduled for the patient.  I have discussed the findings and recommendations with the patient. If applicable, a reminder letter will be sent to the patient regarding the next appointment.  BI-RADS CATEGORY 4: Suspicious.   Electronically Signed By: Wendy [name] M.D. On: 06/25/2019 14:23       Diagnosis Lymph node, needle/core biopsy, left axilla - POORLY DIFFERENTIATED CARCINOMA CONSISTENT WITH BREAST PRIMARY. - NO LYMPHOID NODAL TISSUE IDENTIFIED. - SEE MICROSCOPIC DESCRIPTION. Microscopic Comment The carcinoma is consistent with grade III. Estrogen receptor, progesterone receptor and Her2/neu will be performed. Wendy [name] agrees. Called to [facility] and Wendy [name] on 07/02/19. (JDP:ah 07/02/19) Wendy [name].  The patient is a 78 year old female.   Allergies (Wendy [name]; 07/21/2019 8:55 AM) No Known Allergies [11/13/2016]: Allergies Reconciled    Vitals (Wendy [name] [name]; 07/21/2019 8:56 AM) 07/21/2019 8:55 AM Weight: 194.4 lb Height: 62in Body Surface Area: 1.89 m Body Mass Index: 35.56 kg/m  Temp.: 98.3F(Oral)  Pulse: 98 (Regular)  BP: 136/82 (Sitting, Left Arm, Standard)        Physical Exam (Wendy [name] A. Wendy Lein MD; 07/21/2019 11:15 AM)  General Mental Status-Alert. General Appearance-Consistent with stated age. Hydration-Well hydrated. Voice-Normal.  Head and Neck Head-normocephalic, atraumatic with no lesions or palpable masses. Trachea-midline. Thyroid Gland Characteristics - normal size and  consistency.  Chest and Lung Exam Chest and lung exam   reveals -quiet, even and easy respiratory effort with no use of accessory muscles and on auscultation, normal breath sounds, no adventitious sounds and normal vocal resonance. Inspection Chest Wall - Normal. Back - normal.  Breast Note: Left axilla shows mild adenopathy. No lymphedema. no breast mass bilaterally healed scar left breast  Cardiovascular Cardiovascular examination reveals -normal heart sounds, regular rate and rhythm with no murmurs and normal pedal pulses bilaterally.  Neurologic Neurologic evaluation reveals -alert and oriented x 3 with no impairment of recent or remote memory. Mental Status-Normal.  Musculoskeletal Normal Exam - Left-Upper Extremity Strength Normal and Lower Extremity Strength Normal. Normal Exam - Right-Upper Extremity Strength Normal and Lower Extremity Strength Normal.  Lymphatic Head & Neck  General Head & Neck Lymphatics: Bilateral - Description - Normal. Axillary  General Axillary Region: Bilateral - Description - Normal. Tenderness - Non Tender.    Assessment & Plan (Anarosa Kubisiak A. Ivannia Willhelm MD; 07/21/2019 11:17 AM)  HISTORY OF BREAST CANCER (Z85.3) Impression: Patient recurrent disease left axilla. Recommend completion left axillary lymph node dissection. She can have no further radiation therapy to the side and is not a good chemotherapy candidate. Her current disease is ER positive .  Discussed the procedure. Risk of bleeding, infection, lymphedema risk is roughly 45% or higher, numbness, pain, injury to major underlying blood vessels and/or nerves  she agrees to proceed   BREAST CANCER, LEFT (C50.912)  Current Plans The anatomy and the physiology was discussed. The pathophysiology and natural history of the disease was discussed. Options were discussed and recommendations were made. Technique, risks, benefits, & alternatives were discussed. Risks such as stroke,  heart attack, bleeding, indection, death, and other risks discussed. Questions answered. The patient agrees to proceed. Pt Education - [facility] Education - Written Instructions given: discussed with patient and provided information. Pt Education - [facility] General Post-op HCI 

## 2019-07-22 ENCOUNTER — Telehealth: Payer: Self-pay | Admitting: Nurse Practitioner

## 2019-07-22 NOTE — Telephone Encounter (Signed)
Attempted to reach patient to discuss MRI results. No answer on mobile or home #'s. She has PET coming up on 10/23. Will call her back to review results after her scan.  Cira Rue, NP 07/22/19

## 2019-07-22 NOTE — Telephone Encounter (Signed)
Pt scheduled for Thursday 07/24/19 at 220

## 2019-07-24 ENCOUNTER — Encounter: Payer: Self-pay | Admitting: Family Medicine

## 2019-07-24 ENCOUNTER — Ambulatory Visit (INDEPENDENT_AMBULATORY_CARE_PROVIDER_SITE_OTHER): Payer: Medicare Other | Admitting: Family Medicine

## 2019-07-24 ENCOUNTER — Other Ambulatory Visit: Payer: Self-pay

## 2019-07-24 VITALS — BP 130/70 | HR 76 | Temp 98.0°F | Ht 62.0 in | Wt 195.6 lb

## 2019-07-24 DIAGNOSIS — F411 Generalized anxiety disorder: Secondary | ICD-10-CM | POA: Diagnosis not present

## 2019-07-24 DIAGNOSIS — Z23 Encounter for immunization: Secondary | ICD-10-CM

## 2019-07-24 DIAGNOSIS — F325 Major depressive disorder, single episode, in full remission: Secondary | ICD-10-CM | POA: Diagnosis not present

## 2019-07-24 DIAGNOSIS — E1165 Type 2 diabetes mellitus with hyperglycemia: Secondary | ICD-10-CM

## 2019-07-24 DIAGNOSIS — F5102 Adjustment insomnia: Secondary | ICD-10-CM

## 2019-07-24 DIAGNOSIS — E113419 Type 2 diabetes mellitus with severe nonproliferative diabetic retinopathy with macular edema, unspecified eye: Secondary | ICD-10-CM | POA: Diagnosis not present

## 2019-07-24 DIAGNOSIS — IMO0002 Reserved for concepts with insufficient information to code with codable children: Secondary | ICD-10-CM

## 2019-07-24 DIAGNOSIS — Z794 Long term (current) use of insulin: Secondary | ICD-10-CM | POA: Diagnosis not present

## 2019-07-24 LAB — POCT GLYCOSYLATED HEMOGLOBIN (HGB A1C): Hemoglobin A1C: 8.4 % — AB (ref 4.0–5.6)

## 2019-07-24 MED ORDER — TRAZODONE HCL 50 MG PO TABS: 25.0000 mg | ORAL_TABLET | Freq: Every evening | ORAL | 3 refills | Status: DC | PRN

## 2019-07-24 NOTE — Progress Notes (Signed)
Phone [phone]   Subjective:  BESSYE STITH is a 78 y.o. year old very pleasant female patient who presents for/with See problem oriented charting Chief Complaint  Patient presents with  . Follow-up. insomnia  . wants to discuss not sleeping   ROS- No chest pain or shortness of breath. No headache or blurry vision.    Past Medical History-  Patient Active Problem List   Diagnosis Date Noted  . Shortness of breath 03/14/2017    Priority: High  . Malignant neoplasm of upper-outer quadrant of left breast in female, estrogen receptor positive ([place]) 11/20/2016    Priority: High  . 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 Diastolic dysfunction 41/66/0630    Priority: High  . Depression, major, single episode, mild ([place]) 12/05/2018    Priority: Medium  . Rectal bleeding 01/17/2017    Priority: Medium  . CKD (chronic kidney disease) stage 3, GFR 30-59 ml/min ([facility]) 03/17/2016    Priority: Medium  . Osteopenia 01/05/2016    Priority: Medium  . TOBACCO USE 04/13/2008    Priority: Medium  . Hyperlipidemia associated with type 2 diabetes mellitus ([place]) 11/29/2006    Priority: Medium  . OBESITY, BMI 30-35 11/29/2006    Priority: Medium  . Hypertension associated with diabetes ([place]) 11/29/2006    Priority: Medium  . Fatigue 03/14/2017    Priority: Low  . Edema 03/14/2017    Priority: Low  . Port catheter in place 01/18/2017    Priority: Low  . Constipation 01/17/2017    Priority: Low  . Seasonal allergies 12/01/2013    Priority: Low  . Grief 05/10/2018  . Anemia 01/09/2018    Medications- reviewed and updated Current Outpatient Medications  Medication Sig Dispense Refill  . amLODipine (NORVASC) 10 MG tablet TAKE 1 TABLET BY MOUTH EVERY DAY 90 tablet 1  . aspirin 81 MG tablet Take 81 mg by mouth daily.      . B-D INS SYRINGE 0.5CC/31GX5/16  31G X 5/16" 0.5 ML MISC USE AS DIRECTED 4 TIMES A DAY 300 each 1  . busPIRone (BUSPAR) 10 MG tablet Take 1 tablet (10 mg total) by mouth 3 (three) times daily. 270 tablet 0  . carvedilol (COREG) 25 MG tablet Take 1 tablet (25 mg total) by mouth 2 (two) times daily with a meal. 180 tablet 2  . cloNIDine (CATAPRES) 0.3 MG tablet TAKE 1 TABLET (0.3 MG TOTAL) BY MOUTH 2 (TWO) TIMES DAILY. 180 tablet 1  . Insulin Glargine (LANTUS SOLOSTAR) 100 UNIT/ML Solostar Pen Inject 24 units into the skin 10 pen 5  . Insulin Pen Needle 31G X 5 MM MISC Use pen needles for insulin injection daily 100 each 3  . insulin regular (NOVOLIN R,HUMULIN R) 100 units/mL injection Inject 0.1-0.16 mLs (10-16 Units total) into the skin 3 (three) times daily before meals. Relion. Please provide insulin syringes needed 20 mL 11  . Insulin Syringe-Needle U-100 (B-D INS SYRINGE 0.5CC/30GX1/2") 30G X 1/2" 0.5 ML MISC USE AS DIRECTED 4 TIMES A DAY 300 each 3  . letrozole (FEMARA) 2.5 MG tablet TAKE 1 TABLET BY MOUTH EVERY DAY 90 tablet 1  . lisinopril-hydrochlorothiazide (ZESTORETIC) 10-12.5 MG tablet TAKE 1 TABLET BY MOUTH EVERY DAY 90 tablet 3  . lovastatin (MEVACOR) 40 MG tablet Take 1 tablet (40 mg total) by mouth at bedtime. 90 tablet 3  . metFORMIN (GLUCOPHAGE) 500 MG  tablet TAKE 2 TABLETS BY MOUTH DAILY WITH SUPPER. 180 tablet 2  . OVER THE COUNTER MEDICATION Take 1 tablet by mouth daily as needed. Stool softner for constipation, also drinks warm prune juice prn    . polyethylene glycol powder (GLYCOLAX/MIRALAX) 17 GM/SCOOP powder Take 17 g by mouth daily. 1 g 0  . SUPREP BOWEL PREP KIT 17.5-3.13-1.6 GM/177ML SOLN Take 1 kit by mouth as directed. For colonoscopy prep 354 mL 0  . traMADol (ULTRAM) 50 MG tablet TAKE 1 TABLET BY MOUTH EVERY 6 (SIX) HOURS AS NEEDED FOR MODERATE PAIN OR SEVERE PAIN (NECK AND SHOULDER PAIN). 20 tablet 0  . traZODone (DESYREL) 50 MG tablet Take 0.5-1 tablets (25-50 mg total) by mouth at bedtime as needed  for sleep. 30 tablet 3   No current facility-administered medications for this visit.      Objective:  BP 130/70   Pulse 76   Temp 98 F (36.7 C)   Ht 5' 2"  (1.575 m)   Wt 195 lb 9.6 oz (88.7 kg)   LMP  (LMP Unknown)   SpO2 99%   BMI 35.78 kg/m  Gen: NAD, resting comfortably CV: RRR no murmurs rubs or gallops Lungs: CTAB no crackles, wheeze, rhonchi Abdomen: soft/nontender/nondistended/normal bowel sounds.  Ext: trace edema Skin: warm, dry    Assessment and Plan   # Patient has upcoming lymph node dissection for recurrence of left breast cancer. Questions about whether shes a great candidate for chemotherapy in notes- to be determined but looks like surgery is primary treatment. Discovered in September.  - she reports can climb flight of steps without chest pain or significant shortness of breath - I could sign for PT/OT after surgery if within 30 days of this visit.  -Patient previously had left neck pain and had seen [facility]-if her left arm pain does not improve after surgery may need to get her plugged in for follow-up.  She is also getting some numbness/tingling-wonder about cervical disc issue-believes she has had imaging with [facility] and may need to obtain that if not improving.  She has tramadol as needed for pain-I would be willing to refill  # Depression/anxiety/GAD/insomnia S: patient taking buspirone 38m TID. PHQ9 has trended down.  Denies depressed mood.  Currently her main concern is sleep.  Pt states its hard to fall asleep and stay asleep. She goes to bed about 10p and wakes up around 2am and cant fall back asleep until about 4:30-5am.   She states started 2-3 months ago and tried to ignore but has caught up to her.   Watching tv when she gets up. Also watches before bed.  [facility]  Decreased Interest 0  Down, Depressed, Hopeless 0  PHQ - 2 Score 0  Altered sleeping 3  Tired, decreased energy 1  Change in appetite 0   Feeling bad or failure about yourself  0  Trouble concentrating 0  Moving slowly or fidgety/restless 0  Suicidal thoughts 0  PHQ-9 Score 4  Difficult doing work/chores Not difficult at all  Some recent data might be hidden  A/P: Depression in remission and anxiety are reasonably controlled-anxiety continue buspirone 10 mg 3 times daily.  On the other hand is having issues with insomnia after recent stressors about recurrence of cancer.  We discussed some sleep hygiene issues today-waking up and watching TV and watching TV before bed-she will try to correct these. -We also decided to try a low-dose of trazodone today at 25  mg and titrate to 50 mg if needed -I gave patient information on serotonin syndrome but I do not think this is likely with tramadol/trazodone/buspirone  # Diabetes S: Patient is working with Dr. Cruzita Lederer to bring her blood sugars down Lab Results  Component Value Date   HGBA1C 8.4 (A) 07/24/2019   HGBA1C 8.8 (H) 04/01/2019   HGBA1C 8.6 (A) 09/05/2018   A/P: Poor control today-we checked A1c because we did not see a scheduled visit with endocrinology-we will have team reach out and see if we can get patient scheduled  Recommended follow up: Appears we are scheduled for 1 month follow-up to check in Future Appointments  Date Time Provider [place]  07/25/2019 11:00 AM [facility] PET CT 1 [facility] [place]  07/31/2019 12:00 PM Cottle, Bambi G, LCSW [facility] None  08/01/2019  9:25 AM [facility] None  08/12/2019 10:00 AM Marin Olp, MD [facility]  08/13/2019 11:00 AM Cottle, Bambi G, LCSW [facility] None  10/06/2019 11:00 AM [facility] LAB 4 [facility] None  10/06/2019 11:20 AM [name], MD [facility] None    Lab/Order associations:   ICD-10-CM   1. Adjustment insomnia  F51.02   2. Generalized anxiety disorder  F41.1   3. Major depressive disorder with single episode, in full remission ([place])  F32.5   4. Uncontrolled type 2 diabetes mellitus with  severe nonproliferative retinopathy and macular edema, with long-term current use of insulin ([facility])  E11.3419 POCT HgB A1C   Z79.4    E11.65   5. Need for immunization against influenza  Z23 Flu Vaccine QUAD High Dose(Fluad)    Meds ordered this encounter  Medications  . traZODone (DESYREL) 50 MG tablet    Sig: Take 0.5-1 tablets (25-50 mg total) by mouth at bedtime as needed for sleep.    Dispense:  30 tablet    Refill:  3    Return precautions advised.  Garret Reddish, MD

## 2019-07-24 NOTE — Patient Instructions (Addendum)
Health Maintenance Due  Topic Date Due  . Samul Dada -will check with pharmacy 03/02/2009  . HEMOGLOBIN A1C -today 07/02/2019   Try half tablet of trazodone for a week- if not effective can increase to full pill. Avoid tv anytime an hour before bed until time you would normally wake up in the morning.   A1c is still up at 8.4 but slightly lower- please call Dr. Cruzita Lederer for follow up- she may be able to do a video visit so you can do this even in recover.   Best of luck with your upcoming procedure  If left arm pain persists despite surgery and therapy- consider following up with [facility] as they have treated neck pain before   Risks with tramadol, trazodone, buspirone together- so want to minimize trazodone being used close with tramadol Serotonin Syndrome Serotonin is a chemical in your body (neurotransmitter) that helps to control several functions, such as:  Brain and nerve cell function.  Mood and emotions.  Memory.  Eating.  Sleeping.  Sexual activity.  Stress response. Having too much serotonin in your body can cause serotonin syndrome. This condition can be harmful to your brain and nerve cells. This can be a life-threatening condition. What are the causes? This condition may be caused by taking medicines or drugs that increase the level of serotonin in your body, such as:  Antidepressant medicines.  Migraine medicines.  Certain pain medicines.  Certain drugs, including ecstasy, LSD, cocaine, and amphetamines.  Over-the-counter cough or cold medicines that contain dextromethorphan.  Certain herbal supplements, including St. John's wort, ginseng, and nutmeg. This condition usually occurs when you take these medicines or drugs in combination, but it can also happen with a high dose of a single medicine or drug. What increases the risk? You are more likely to develop this condition if:  You just started taking a medicine or drug that increases the level of  serotonin in the body.  You recently increased the dose of a medicine or drug that increases the level of serotonin in the body.  You take more than one medicine or drug that increases the level of serotonin in the body. What are the signs or symptoms? Symptoms of this condition usually start within several hours of taking a medicine or drug. Symptoms may be mild or severe. Mild symptoms include:  Sweating.  Restlessness or agitation.  Muscle twitching or stiffness.  Rapid heart rate.  Nausea and vomiting.  Diarrhea.  Headache.  Shivering or goose bumps.  Confusion. Severe symptoms include:  Irregular heartbeat.  Seizures.  Loss of consciousness.  High fever. How is this diagnosed? This condition may be diagnosed based on:  Your medical history.  A physical exam.  Your prior use of drugs and medicines.  Blood or urine tests. These may be used to rule out other causes of your symptoms. How is this treated? The treatment for this condition depends on the severity of your symptoms.  For mild cases, stopping the medicine or drug that caused your condition is usually all that is needed.  For moderate to severe cases, treatment in a hospital may be needed to prevent or manage life-threatening symptoms. This may include medicines to control your symptoms, IV fluids, interventions to support your breathing, and treatments to control your body temperature. Follow these instructions at home: Medicines   Take over-the-counter and prescription medicines only as told by your health care provider. This is important.  Check with your health care provider before you start  taking any new prescriptions, over-the-counter medicines, herbs, or supplements.  Avoid combining any medicines that can cause this condition to occur. Lifestyle   Maintain a healthy lifestyle. ? Eat a healthy diet that includes plenty of vegetables, fruits, whole grains, low-fat dairy products, and  lean protein. Do not eat a lot of foods that are high in fat, added sugars, or salt. ? Get the right amount and quality of sleep. Most adults need 7-9 hours of sleep each night. ? Make time to exercise, even if it is only for short periods of time. Most adults should exercise for at least 150 minutes each week. ? Do not drink alcohol. ? Do not use illegal drugs, and do not take medicines for reasons other than they are prescribed. General instructions  Do not use any products that contain nicotine or tobacco, such as cigarettes and e-cigarettes. If you need help quitting, ask your health care provider.  Keep all follow-up visits as told by your health care provider. This is important. Contact a health care provider if:  Your symptoms do not improve or they get worse. Get help right away if you:  Have worsening confusion, severe headache, chest pain, high fever, seizures, or loss of consciousness.  Experience serious side effects of medicine, such as swelling of your face, lips, tongue, or throat.  Have serious thoughts about hurting yourself or others. These symptoms may represent a serious problem that is an emergency. Do not wait to see if the symptoms will go away. Get medical help right away. Call your local emergency services (911 in the [place] Do not drive yourself to the hospital. If you ever feel like you may hurt yourself or others, or have thoughts about taking your own life, get help right away. You can go to your nearest emergency department or call:  Your local emergency services (911 in the [place]  A suicide crisis helpline, such as the [facility] at 478-409-5741. This is open 24 hours a day. Summary  Serotonin is a brain chemical that helps to regulate the nervous system. High levels of serotonin in the body can cause serotonin syndrome, which is a very dangerous condition.  This condition may be caused by taking medicines or drugs that  increase the level of serotonin in your body.  Treatment depends on the severity of your symptoms. For mild cases, stopping the medicine or drug that caused your condition is usually all that is needed.  Check with your health care provider before you start taking any new prescriptions, over-the-counter medicines, herbs, or supplements. This information is not intended to replace advice given to you by your health care provider. Make sure you discuss any questions you have with your health care provider. Document Released: 10/26/2004 Document Revised: 10/26/2017 Document Reviewed: 10/26/2017 Elsevier Patient Education  2020 [employer].

## 2019-07-25 ENCOUNTER — Ambulatory Visit (HOSPITAL_COMMUNITY)
Admission: RE | Admit: 2019-07-25 | Discharge: 2019-07-25 | Disposition: A | Discharge status: Home / Self Care | Payer: Medicare Other | Source: Ambulatory Visit | Attending: Nurse Practitioner | Admitting: Nurse Practitioner

## 2019-07-25 DIAGNOSIS — Z79899 Other long term (current) drug therapy: Secondary | ICD-10-CM | POA: Diagnosis not present

## 2019-07-25 DIAGNOSIS — D259 Leiomyoma of uterus, unspecified: Secondary | ICD-10-CM | POA: Insufficient documentation

## 2019-07-25 DIAGNOSIS — C50919 Malignant neoplasm of unspecified site of unspecified female breast: Secondary | ICD-10-CM | POA: Diagnosis not present

## 2019-07-25 DIAGNOSIS — N189 Chronic kidney disease, unspecified: Secondary | ICD-10-CM | POA: Insufficient documentation

## 2019-07-25 DIAGNOSIS — Z7982 Long term (current) use of aspirin: Secondary | ICD-10-CM | POA: Insufficient documentation

## 2019-07-25 DIAGNOSIS — E785 Hyperlipidemia, unspecified: Secondary | ICD-10-CM | POA: Diagnosis not present

## 2019-07-25 DIAGNOSIS — D493 Neoplasm of unspecified behavior of breast: Secondary | ICD-10-CM | POA: Insufficient documentation

## 2019-07-25 DIAGNOSIS — E119 Type 2 diabetes mellitus without complications: Secondary | ICD-10-CM | POA: Diagnosis not present

## 2019-07-25 DIAGNOSIS — I129 Hypertensive chronic kidney disease with stage 1 through stage 4 chronic kidney disease, or unspecified chronic kidney disease: Secondary | ICD-10-CM | POA: Insufficient documentation

## 2019-07-25 LAB — GLUCOSE, CAPILLARY: Glucose-Capillary: 251 mg/dL — ABNORMAL HIGH (ref 70–99)

## 2019-07-25 MED ORDER — FLUDEOXYGLUCOSE F - 18 (FDG) INJECTION
9.7600 | Freq: Once | INTRAVENOUS | Status: AC | PRN
  Administered 2019-07-25: 9.76 via INTRAVENOUS

## 2019-07-28 ENCOUNTER — Other Ambulatory Visit: Payer: Self-pay

## 2019-07-28 ENCOUNTER — Telehealth: Payer: Self-pay

## 2019-07-28 NOTE — Telephone Encounter (Signed)
Chart reviewed, last OV was in July. Patient instructed to follow up in 3-4 months which puts her due by next month.  Please outreach and try to get her in next month with Dr. Cruzita Lederer, virtual is ok.

## 2019-07-28 NOTE — Telephone Encounter (Signed)
-----   Message from Angela Adam, [place] sent at 07/28/2019  9:01 AM EDT -----  ----- Message ----- From: Marin Olp, MD Sent: 07/24/2019   5:43 PM EDT To: Dione Housekeeper  Can you find out who Dr. Arman Filter nurse is and inform her a1c remains high and we encouraged patient to schedule a follow up visit? Thanks, Garret Reddish

## 2019-07-29 ENCOUNTER — Ambulatory Visit: Payer: Medicare Other | Admitting: Family Medicine

## 2019-07-29 NOTE — Telephone Encounter (Signed)
ATC both phone#'s but was unable to LM. No VM.

## 2019-07-30 DIAGNOSIS — C50912 Malignant neoplasm of unspecified site of left female breast: Secondary | ICD-10-CM | POA: Diagnosis not present

## 2019-07-31 ENCOUNTER — Ambulatory Visit: Payer: Medicare Other | Admitting: Psychology

## 2019-07-31 ENCOUNTER — Other Ambulatory Visit (HOSPITAL_BASED_OUTPATIENT_CLINIC_OR_DEPARTMENT_OTHER): Payer: Medicare Other

## 2019-07-31 NOTE — Progress Notes (Addendum)

## 2019-08-01 ENCOUNTER — Other Ambulatory Visit (HOSPITAL_COMMUNITY)
Admission: RE | Admit: 2019-08-01 | Discharge: 2019-08-01 | Disposition: A | Discharge status: Home / Self Care | Payer: Medicare Other | Source: Ambulatory Visit | Attending: Surgery | Admitting: Surgery

## 2019-08-01 DIAGNOSIS — Z01812 Encounter for preprocedural laboratory examination: Secondary | ICD-10-CM | POA: Diagnosis not present

## 2019-08-01 DIAGNOSIS — Z20828 Contact with and (suspected) exposure to other viral communicable diseases: Secondary | ICD-10-CM | POA: Insufficient documentation

## 2019-08-02 LAB — NOVEL CORONAVIRUS, NAA (HOSP ORDER, SEND-OUT TO REF LAB; TAT 18-24 HRS): SARS-CoV-2, NAA: NOT DETECTED

## 2019-08-05 ENCOUNTER — Encounter (HOSPITAL_BASED_OUTPATIENT_CLINIC_OR_DEPARTMENT_OTHER): Payer: Self-pay

## 2019-08-05 ENCOUNTER — Encounter (HOSPITAL_BASED_OUTPATIENT_CLINIC_OR_DEPARTMENT_OTHER)
Admission: RE | Disposition: A | Discharge status: Home / Self Care | Payer: Self-pay | Source: Ambulatory Visit | Attending: Surgery

## 2019-08-05 ENCOUNTER — Other Ambulatory Visit: Payer: Self-pay

## 2019-08-05 ENCOUNTER — Ambulatory Visit (HOSPITAL_BASED_OUTPATIENT_CLINIC_OR_DEPARTMENT_OTHER)
Admission: RE | Admit: 2019-08-05 | Discharge: 2019-08-05 | Disposition: A | Discharge status: Home / Self Care | Payer: Medicare Other | Source: Ambulatory Visit | Attending: Surgery | Admitting: Surgery

## 2019-08-05 ENCOUNTER — Ambulatory Visit (HOSPITAL_BASED_OUTPATIENT_CLINIC_OR_DEPARTMENT_OTHER): Payer: Medicare Other | Admitting: Anesthesiology

## 2019-08-05 DIAGNOSIS — E785 Hyperlipidemia, unspecified: Secondary | ICD-10-CM | POA: Diagnosis not present

## 2019-08-05 DIAGNOSIS — Z170 Estrogen receptor positive status [ER+]: Secondary | ICD-10-CM | POA: Diagnosis not present

## 2019-08-05 DIAGNOSIS — C761 Malignant neoplasm of thorax: Secondary | ICD-10-CM | POA: Diagnosis not present

## 2019-08-05 DIAGNOSIS — F418 Other specified anxiety disorders: Secondary | ICD-10-CM | POA: Diagnosis not present

## 2019-08-05 DIAGNOSIS — Z853 Personal history of malignant neoplasm of breast: Secondary | ICD-10-CM | POA: Insufficient documentation

## 2019-08-05 DIAGNOSIS — C50412 Malignant neoplasm of upper-outer quadrant of left female breast: Secondary | ICD-10-CM | POA: Diagnosis not present

## 2019-08-05 DIAGNOSIS — C801 Malignant (primary) neoplasm, unspecified: Secondary | ICD-10-CM | POA: Diagnosis not present

## 2019-08-05 DIAGNOSIS — I10 Essential (primary) hypertension: Secondary | ICD-10-CM | POA: Diagnosis not present

## 2019-08-05 DIAGNOSIS — E119 Type 2 diabetes mellitus without complications: Secondary | ICD-10-CM | POA: Insufficient documentation

## 2019-08-05 DIAGNOSIS — C773 Secondary and unspecified malignant neoplasm of axilla and upper limb lymph nodes: Secondary | ICD-10-CM | POA: Insufficient documentation

## 2019-08-05 DIAGNOSIS — Z794 Long term (current) use of insulin: Secondary | ICD-10-CM | POA: Diagnosis not present

## 2019-08-05 DIAGNOSIS — G8918 Other acute postprocedural pain: Secondary | ICD-10-CM | POA: Diagnosis not present

## 2019-08-05 HISTORY — PX: AXILLARY LYMPH NODE DISSECTION: SHX5229

## 2019-08-05 LAB — GLUCOSE, CAPILLARY
Glucose-Capillary: 380 mg/dL — ABNORMAL HIGH (ref 70–99)
Glucose-Capillary: 322 mg/dL — ABNORMAL HIGH (ref 70–99)
Glucose-Capillary: 159 mg/dL — ABNORMAL HIGH (ref 70–99)

## 2019-08-05 SURGERY — LYMPHADENECTOMY, AXILLARY
Anesthesia: Regional | Site: Axilla | Laterality: Left

## 2019-08-05 MED ORDER — ACETAMINOPHEN 500 MG PO TABS
1000.0000 mg | ORAL_TABLET | ORAL | Status: AC
  Administered 2019-08-05: 1000 mg via ORAL

## 2019-08-05 MED ORDER — LACTATED RINGERS IV SOLN
INTRAVENOUS | Status: DC
  Administered 2019-08-05 (×2): via INTRAVENOUS

## 2019-08-05 MED ORDER — PROPOFOL 10 MG/ML IV BOLUS
INTRAVENOUS | Status: DC | PRN
  Administered 2019-08-05: 150 mg via INTRAVENOUS

## 2019-08-05 MED ORDER — EPHEDRINE SULFATE 50 MG/ML IJ SOLN
INTRAMUSCULAR | Status: DC | PRN
  Administered 2019-08-05: 10 mg via INTRAVENOUS

## 2019-08-05 MED ORDER — CHLORHEXIDINE GLUCONATE CLOTH 2 % EX PADS: 6.0000 | MEDICATED_PAD | Freq: Once | CUTANEOUS | Status: DC

## 2019-08-05 MED ORDER — FENTANYL CITRATE (PF) 100 MCG/2ML IJ SOLN
25.0000 ug | INTRAMUSCULAR | Status: DC | PRN
  Administered 2019-08-05: 25 ug via INTRAVENOUS

## 2019-08-05 MED ORDER — FENTANYL CITRATE (PF) 100 MCG/2ML IJ SOLN
50.0000 ug | INTRAMUSCULAR | Status: DC | PRN
  Administered 2019-08-05: 100 ug via INTRAVENOUS

## 2019-08-05 MED ORDER — ROPIVACAINE HCL 5 MG/ML IJ SOLN
INTRAMUSCULAR | Status: DC | PRN
  Administered 2019-08-05: 30 mL via PERINEURAL

## 2019-08-05 MED ORDER — HYDROCODONE-ACETAMINOPHEN 5-325 MG PO TABS: 1.0000 | ORAL_TABLET | Freq: Four times a day (QID) | ORAL | 0 refills | Status: DC | PRN

## 2019-08-05 MED ORDER — FENTANYL CITRATE (PF) 100 MCG/2ML IJ SOLN
INTRAMUSCULAR | Status: AC
  Filled 2019-08-05: qty 2

## 2019-08-05 MED ORDER — INSULIN ASPART 100 UNIT/ML ~~LOC~~ SOLN: 9.0000 [IU] | Freq: Once | SUBCUTANEOUS | Status: DC

## 2019-08-05 MED ORDER — INSULIN ASPART 100 UNIT/ML ~~LOC~~ SOLN
9.0000 [IU] | Freq: Once | SUBCUTANEOUS | Status: AC
  Administered 2019-08-05: 9 [IU] via SUBCUTANEOUS

## 2019-08-05 MED ORDER — GABAPENTIN 300 MG PO CAPS
ORAL_CAPSULE | ORAL | Status: AC
  Filled 2019-08-05: qty 1

## 2019-08-05 MED ORDER — OXYCODONE HCL 5 MG PO TABS
5.0000 mg | ORAL_TABLET | Freq: Once | ORAL | Status: AC
  Administered 2019-08-05: 5 mg via ORAL

## 2019-08-05 MED ORDER — DEXTROSE 5 % IV SOLN
3.0000 g | INTRAVENOUS | Status: AC
  Administered 2019-08-05: 2 g via INTRAVENOUS

## 2019-08-05 MED ORDER — BUPIVACAINE HCL (PF) 0.25 % IJ SOLN
INTRAMUSCULAR | Status: DC | PRN
  Administered 2019-08-05: 6 mL

## 2019-08-05 MED ORDER — CEFAZOLIN SODIUM-DEXTROSE 2-4 GM/100ML-% IV SOLN
INTRAVENOUS | Status: AC
  Filled 2019-08-05: qty 100

## 2019-08-05 MED ORDER — ONDANSETRON HCL 4 MG/2ML IJ SOLN
4.0000 mg | Freq: Once | INTRAMUSCULAR | Status: AC | PRN
  Administered 2019-08-05: 4 mg via INTRAVENOUS

## 2019-08-05 MED ORDER — MIDAZOLAM HCL 2 MG/2ML IJ SOLN
INTRAMUSCULAR | Status: AC
  Filled 2019-08-05: qty 2

## 2019-08-05 MED ORDER — GABAPENTIN 300 MG PO CAPS
300.0000 mg | ORAL_CAPSULE | ORAL | Status: AC
  Administered 2019-08-05: 300 mg via ORAL

## 2019-08-05 MED ORDER — MIDAZOLAM HCL 2 MG/2ML IJ SOLN: 1.0000 mg | INTRAMUSCULAR | Status: DC | PRN

## 2019-08-05 MED ORDER — LIDOCAINE HCL (CARDIAC) PF 100 MG/5ML IV SOSY
PREFILLED_SYRINGE | INTRAVENOUS | Status: DC | PRN
  Administered 2019-08-05: 100 mg via INTRAVENOUS

## 2019-08-05 MED ORDER — DEXAMETHASONE SODIUM PHOSPHATE 4 MG/ML IJ SOLN: INTRAMUSCULAR | Status: DC | PRN

## 2019-08-05 MED ORDER — ACETAMINOPHEN 500 MG PO TABS
ORAL_TABLET | ORAL | Status: AC
  Filled 2019-08-05: qty 2

## 2019-08-05 MED ORDER — OXYCODONE HCL 5 MG PO TABS
ORAL_TABLET | ORAL | Status: AC
  Filled 2019-08-05: qty 1

## 2019-08-05 MED ORDER — INSULIN ASPART 100 UNIT/ML ~~LOC~~ SOLN
SUBCUTANEOUS | Status: AC
  Filled 2019-08-05: qty 1

## 2019-08-05 MED ORDER — PHENYLEPHRINE HCL (PRESSORS) 10 MG/ML IV SOLN
INTRAVENOUS | Status: DC | PRN
  Administered 2019-08-05: 80 ug via INTRAVENOUS

## 2019-08-05 SURGICAL SUPPLY — 52 items
APPLIER CLIP 11 MED OPEN (CLIP) ×3
APPLIER CLIP 9.375 MED OPEN (MISCELLANEOUS) ×6
BINDER BREAST XXLRG (GAUZE/BANDAGES/DRESSINGS) ×3 IMPLANT
BIOPATCH RED 1 DISK 7.0 (GAUZE/BANDAGES/DRESSINGS) ×2 IMPLANT
BIOPATCH RED 1IN DISK 7.0MM (GAUZE/BANDAGES/DRESSINGS) ×1
BLADE SURG 15 STRL LF DISP TIS (BLADE) ×1 IMPLANT
BLADE SURG 15 STRL SS (BLADE) ×2
CANISTER SUCT 1200ML W/VALVE (MISCELLANEOUS) ×3 IMPLANT
CHLORAPREP W/TINT 26 (MISCELLANEOUS) ×3 IMPLANT
CLIP APPLIE 11 MED OPEN (CLIP) ×1 IMPLANT
CLIP APPLIE 9.375 MED OPEN (MISCELLANEOUS) ×2 IMPLANT
COVER BACK TABLE REUSABLE LG (DRAPES) ×3 IMPLANT
COVER MAYO STAND REUSABLE (DRAPES) ×3 IMPLANT
DECANTER SPIKE VIAL GLASS SM (MISCELLANEOUS) ×3 IMPLANT
DERMABOND ADVANCED (GAUZE/BANDAGES/DRESSINGS) ×2
DERMABOND ADVANCED .7 DNX12 (GAUZE/BANDAGES/DRESSINGS) ×1 IMPLANT
DRAIN CHANNEL 19F RND (DRAIN) ×3 IMPLANT
DRAPE LAPAROSCOPIC ABDOMINAL (DRAPES) ×3 IMPLANT
DRAPE UTILITY XL STRL (DRAPES) ×3 IMPLANT
ELECT COATED BLADE 2.86 ST (ELECTRODE) ×3 IMPLANT
ELECT REM PT RETURN 9FT ADLT (ELECTROSURGICAL) ×3
ELECTRODE REM PT RTRN 9FT ADLT (ELECTROSURGICAL) ×1 IMPLANT
EVACUATOR SILICONE 100CC (DRAIN) ×3 IMPLANT
GAUZE SPONGE 4X4 12PLY STRL (GAUZE/BANDAGES/DRESSINGS) ×3 IMPLANT
GLOVE BIO SURGEON STRL SZ 6.5 (GLOVE) ×2 IMPLANT
GLOVE BIO SURGEONS STRL SZ 6.5 (GLOVE) ×1
GLOVE BIOGEL PI IND STRL 7.0 (GLOVE) ×1 IMPLANT
GLOVE BIOGEL PI IND STRL 8 (GLOVE) ×1 IMPLANT
GLOVE BIOGEL PI INDICATOR 7.0 (GLOVE) ×2
GLOVE BIOGEL PI INDICATOR 8 (GLOVE) ×2
GLOVE ECLIPSE 8.0 STRL XLNG CF (GLOVE) ×3 IMPLANT
GOWN STRL REUS W/ TWL LRG LVL3 (GOWN DISPOSABLE) ×2 IMPLANT
GOWN STRL REUS W/TWL LRG LVL3 (GOWN DISPOSABLE) ×4
HEMOSTAT ARISTA ABSORB 3G PWDR (HEMOSTASIS) IMPLANT
HEMOSTAT SURGICEL 2X14 (HEMOSTASIS) IMPLANT
NEEDLE HYPO 25X1 1.5 SAFETY (NEEDLE) ×3 IMPLANT
NS IRRIG 1000ML POUR BTL (IV SOLUTION) ×3 IMPLANT
PACK BASIN DAY SURGERY FS (CUSTOM PROCEDURE TRAY) ×3 IMPLANT
PENCIL BUTTON HOLSTER BLD 10FT (ELECTRODE) ×3 IMPLANT
PIN SAFETY STERILE (MISCELLANEOUS) ×3 IMPLANT
SLEEVE SCD COMPRESS KNEE MED (MISCELLANEOUS) ×3 IMPLANT
SPONGE LAP 4X18 RFD (DISPOSABLE) ×3 IMPLANT
SUT ETHILON 3 0 PS 1 (SUTURE) ×3 IMPLANT
SUT MNCRL AB 4-0 PS2 18 (SUTURE) ×3 IMPLANT
SUT VIC AB 2-0 SH 27 (SUTURE)
SUT VIC AB 2-0 SH 27XBRD (SUTURE) IMPLANT
SUT VICRYL 3-0 CR8 SH (SUTURE) ×3 IMPLANT
SYR CONTROL 10ML LL (SYRINGE) ×3 IMPLANT
TOWEL GREEN STERILE FF (TOWEL DISPOSABLE) ×3 IMPLANT
TUBE CONNECTING 20'X1/4 (TUBING) ×1
TUBE CONNECTING 20X1/4 (TUBING) ×2 IMPLANT
YANKAUER SUCT BULB TIP NO VENT (SUCTIONS) ×3 IMPLANT

## 2019-08-05 NOTE — Anesthesia Procedure Notes (Signed)
Anesthesia Regional Block: Pectoralis block   Pre-Anesthetic Checklist: ,, timeout performed, Correct Patient, Correct Site, Correct Laterality, Correct Procedure, Correct Position, site marked, Risks and benefits discussed,  Surgical consent,  Pre-op evaluation,  At surgeon's request and post-op pain management  Laterality: Left  Prep: chloraprep       Needles:  Injection technique: Single-shot  Needle Type: Echogenic Stimulator Needle     Needle Length: 10cm  Needle Gauge: 21     Additional Needles:   Procedures:,,,, ultrasound used (permanent image in chart),,,,  Narrative:  Start time: 08/05/2019 9:35 AM End time: 08/05/2019 9:45 AM Injection made incrementally with aspirations every 5 mL.  Performed by: Personally  Anesthesiologist: Murvin Natal, MD  Additional Notes: Functioning IV was confirmed and monitors were applied.  A timeout was performed. Sterile prep, hand hygiene and sterile gloves were used. A 167mm 21ga Pajunk echogenic stimulator needle was used. Negative aspiration and negative test dose prior to incremental administration of local anesthetic. The patient tolerated the procedure well.  Ultrasound guidance: relevent anatomy identified, needle position confirmed, local anesthetic spread visualized around nerve(s), vascular puncture avoided.  Image printed for medical record.

## 2019-08-05 NOTE — Transfer of Care (Signed)
Immediate Anesthesia Transfer of Care Note  Patient: Wendy [name]  Procedure(s) Performed: LEFT AXILLARY LYMPH NODE DISSECTION (Left Axilla)  Patient Location: PACU  Anesthesia Type:GA combined with regional for post-op pain  Level of Consciousness: awake, alert , oriented and drowsy  Airway & Oxygen Therapy: Patient Spontanous Breathing and Patient connected to face mask oxygen  Post-op Assessment: Report given to RN and Post -op Vital signs reviewed and stable  Post vital signs: Reviewed and stable  Last Vitals:  Vitals Value Taken Time  BP 162/77 08/05/19 1127  Temp    Pulse 85 08/05/19 1127  Resp 14 08/05/19 1127  SpO2 100 % 08/05/19 1127  Vitals shown include unvalidated device data.  Last Pain:  Vitals:   08/05/19 0826  TempSrc: Oral  PainSc: 0-No pain         Complications: No apparent anesthesia complications

## 2019-08-05 NOTE — Interval H&P Note (Signed)
History and Physical Interval Note:  08/05/2019 9:37 AM  Wendy [name]  has presented today for surgery, with the diagnosis of LEFT AXILLARY LYMPHADENOPATHY.  The various methods of treatment have been discussed with the patient and family. After consideration of risks, benefits and other options for treatment, the patient has consented to  Procedure(s): LEFT AXILLARY LYMPH NODE DISSECTION (Left) as a surgical intervention.  The patient's history has been reviewed, patient examined, no change in status, stable for surgery.  I have reviewed the patient's chart and labs.  Questions were answered to the patient's satisfaction.     [place]

## 2019-08-05 NOTE — Discharge Instructions (Signed)
*You had 1000 mg of Tylenol at 8:30am  Surgical [place] Surgical drains are used to remove extra fluid that normally builds up in a surgical wound after surgery. A surgical drain helps to heal a surgical wound. Different kinds of surgical drains include:  Active drains. These drains use suction to pull drainage away from the surgical wound. Drainage flows through a tube to a container outside of the body. With these drains, you need to keep the bulb or the drainage container flat (compressed) at all times, except while you empty it. Flattening the bulb or container creates suction.  Passive drains. These drains allow fluid to drain naturally, by gravity. Drainage flows through a tube to a bandage (dressing) or a container outside of the body. Passive drains do not need to be emptied. A drain is placed during surgery. Right after surgery, drainage is usually bright red and a little thicker than water. The drainage may gradually turn yellow or pink and become thinner. It is likely that your health care provider will remove the drain when the drainage stops or when the amount decreases to 1-2 Tbsp (15-30 mL) during a 24-hour period. Supplies needed:  Tape.  Germ-free cleaning solution (sterile saline).  Cotton swabs.  Split gauze drain sponge: 4 x 4 inches (10 x 10 cm).  Gauze square: 4 x 4 inches (10 x 10 cm). How to care for your surgical drain Care for your drain as told by your health care provider. This is important to help prevent infection. If your drain is placed at your back, or any other hard-to-reach area, ask another person to assist you in performing the following tasks: General care  Keep the skin around the drain dry and covered with a dressing at all times.  Check your drain area every day for signs of infection. Check for: ? Redness, swelling, or pain. ? Pus or a bad smell. ? Cloudy drainage. ? Tenderness or pressure at the drain exit site. Changing the  dressing Follow instructions from your health care provider about how to change your dressing. Change your dressing at least once a day. Change it more often if needed to keep the dressing dry. Make sure you: 1. Gather your supplies. 2. Wash your hands with soap and water before you change your dressing. If soap and water are not available, use hand sanitizer. 3. Remove the old dressing. Avoid using scissors to do that. 4. Wash your hands with soap and water again after removing the old dressing. 5. Use sterile saline to clean your skin around the drain. You may need to use a cotton swab to clean the skin. 6. Place the tube through the slit in a drain sponge. Place the drain sponge so that it covers your wound. 7. Place the gauze square or another drain sponge on top of the drain sponge that is on the wound. Make sure the tube is between those layers. 8. Tape the dressing to your skin. 9. Tape the drainage tube to your skin 1-2 inches (2.5-5 cm) below the place where the tube enters your body. Taping keeps the tube from pulling on any stitches (sutures) that you have. 10. Wash your hands with soap and water. 11. Write down the color of your drainage and how often you change your dressing. How to empty your active drain  1. Make sure that you have a measuring cup that you can empty your drainage into. 2. Wash your hands with soap and water. If soap  and water are not available, use hand sanitizer. 3. Loosen any pins or clips that hold the tube in place. 4. If your health care provider tells you to strip the tube to prevent clots and tube blockages: ? Hold the tube at the skin with one hand. Use your other hand to pinch the tubing with your thumb and first finger. ? Gently move your fingers down the tube while squeezing very lightly. This clears any drainage, clots, or tissue from the tube. ? You may need to do this several times each day to keep the tube clear. Do not pull on the tube. 5. Open  the bulb cap or the drain plug. Do not touch the inside of the cap or the bottom of the plug. 6. Turn the device upside down and gently squeeze. 7. Empty all of the drainage into the measuring cup. 8. Compress the bulb or the container and replace the cap or the plug. To compress the bulb or the container, squeeze it firmly in the middle while you close the cap or plug the container. 9. Write down the amount of drainage that you have in each 24-hour period. If you have less than 2 Tbsp (30 mL) of drainage during 24 hours, contact your health care provider. 10. Flush the drainage down the toilet. 11. Wash your hands with soap and water. Contact a health care provider if:  You have redness, swelling, or pain around your drain area.  You have pus or a bad smell coming from your drain area.  You have a fever or chills.  The skin around your drain is warm to the touch.  The amount of drainage that you have is increasing instead of decreasing.  You have drainage that is cloudy.  There is a sudden stop or a sudden decrease in the amount of drainage that you have.  Your drain tube falls out.  Your active drain does not stay compressed after you empty it. Summary  Surgical drains are used to remove extra fluid that normally builds up in a surgical wound after surgery.  Different kinds of surgical drains include active drains and passive drains. Active drains use suction to pull drainage away from the surgical wound, and passive drains allow fluid to drain naturally.  It is important to care for your drain to prevent infection. If your drain is placed at your back, or any other hard-to-reach area, ask another person to assist you.  Contact your health care provider if you have redness, swelling, or pain around your drain area. This information is not intended to replace advice given to you by your health care provider. Make sure you discuss any questions you have with your health care  provider. Document Released: 09/15/2000 Document Revised: 10/23/2018 Document Reviewed: 10/23/2018 Elsevier Patient Education  [place] Instructions  Activity: Get plenty of rest for the remainder of the day. A responsible individual must stay with you for 24 hours following the procedure.  For the next 24 hours, DO NOT: -Drive a car -Paediatric nurse -Drink alcoholic beverages -Take any medication unless instructed by your physician -Make any legal decisions or sign important papers.  Meals: Start with liquid foods such as gelatin or soup. Progress to regular foods as tolerated. Avoid greasy, spicy, heavy foods. If nausea and/or vomiting occur, drink only clear liquids until the nausea and/or vomiting subsides. Call your physician if vomiting continues.  Special Instructions/Symptoms: Your throat may feel dry or sore  from the anesthesia or the breathing tube placed in your throat during surgery. If this causes discomfort, gargle with warm salt water. The discomfort should disappear within 24 hours.  If you had a scopolamine patch placed behind your ear for the management of post- operative nausea and/or vomiting:  1. The medication in the patch is effective for 72 hours, after which it should be removed.  Wrap patch in a tissue and discard in the trash. Wash hands thoroughly with soap and water. 2. You may remove the patch earlier than 72 hours if you experience unpleasant side effects which may include dry mouth, dizziness or visual disturbances. 3. Avoid touching the patch. Wash your hands with soap and water after contact with the patch.    About my Jackson-Pratt Bulb Drain  What is a Jackson-Pratt bulb? A Jackson-Pratt is a soft, round device used to collect drainage. It is connected to a long, thin drainage catheter, which is held in place by one or two small stiches near your surgical incision site. When the bulb is squeezed, it forms a  vacuum, forcing the drainage to empty into the bulb.  Emptying the Jackson-Pratt bulb- To empty the bulb: 1. Release the plug on the top of the bulb. 2. Pour the bulb's contents into a measuring container which your nurse will provide. 3. Record the time emptied and amount of drainage. Empty the drain(s) as often as your     doctor or nurse recommends.  Date                  Time                    Amount (Drain 1)                 Amount (Drain 2)  _____________________________________________________________________  _____________________________________________________________________  _____________________________________________________________________  _____________________________________________________________________  _____________________________________________________________________  _____________________________________________________________________  _____________________________________________________________________  _____________________________________________________________________  Squeezing the Jackson-Pratt Bulb- To squeeze the bulb: 1. Make sure the plug at the top of the bulb is open. 2. Squeeze the bulb tightly in your fist. You will hear air squeezing from the bulb. 3. Replace the plug while the bulb is squeezed. 4. Use a safety pin to attach the bulb to your clothing. This will keep the catheter from     pulling at the bulb insertion site.  When to call your doctor- Call your doctor if:  Drain site becomes red, swollen or hot.  You have a fever greater than 101 degrees F.  There is oozing at the drain site.  Drain falls out (apply a guaze bandage over the drain hole and secure it with tape).  Drainage increases daily not related to activity patterns. (You will usually have more drainage when you are active than when you are resting.)  Drainage has a bad odor.

## 2019-08-05 NOTE — Anesthesia Preprocedure Evaluation (Addendum)
Anesthesia Evaluation  Patient identified by MRN, date of birth, ID band Patient awake    Reviewed: Allergy & Precautions, NPO status , Patient's Chart, lab work & pertinent test results, reviewed documented beta blocker date and time   Airway Mallampati: III  TM Distance: >3 FB Neck ROM: Full    Dental no notable dental hx.    Pulmonary neg pulmonary ROS,    Pulmonary exam normal breath sounds clear to auscultation       Cardiovascular hypertension, Pt. on medications and Pt. on home beta blockers Normal cardiovascular exam Rhythm:Regular Rate:Normal  ECG: rate 71. Sinus rhythm Low voltage, precordial leads LVH by voltage  Myocardial perfusion  The left ventricular ejection fraction is normal (55-65%). Nuclear stress EF: 62%. There was no ST segment deviation noted during stress. The study is normal. This is a low risk study. Normal resting and stress perfusion. No ischemia or infarction EF 62%   Neuro/Psych PSYCHIATRIC DISORDERS Anxiety Depression negative neurological ROS     GI/Hepatic negative GI ROS, Neg liver ROS,   Endo/Other  diabetes, Insulin Dependent, Oral Hypoglycemic AgentsBreast CA s/p chemo and radiation   Renal/GU negative Renal ROS     Musculoskeletal negative musculoskeletal ROS (+)   Abdominal (+) + obese,   Peds  Hematology HLD   Anesthesia Other Findings LEFT AXILLARY LYMPHADENOPATHY  Reproductive/Obstetrics                            Anesthesia Physical Anesthesia Plan  ASA: III  Anesthesia Plan: General and Regional   Post-op Pain Management: GA combined w/ Regional for post-op pain   Induction: Intravenous  PONV Risk Score and Plan: 3 and Ondansetron, Dexamethasone and Treatment may vary due to age or medical condition  Airway Management Planned: LMA  Additional Equipment:   Intra-op Plan:   Post-operative Plan: Extubation in OR  Informed  Consent: I have reviewed the patients History and Physical, chart, labs and discussed the procedure including the risks, benefits and alternatives for the proposed anesthesia with the patient or authorized representative who has indicated his/her understanding and acceptance.     Dental advisory given  Plan Discussed with: CRNA  Anesthesia Plan Comments:         Anesthesia Quick Evaluation

## 2019-08-05 NOTE — Anesthesia Procedure Notes (Signed)
Procedure Name: LMA Insertion Date/Time: 08/05/2019 10:06 AM Performed by: Willa Frater, CRNA Pre-anesthesia Checklist: Patient identified, Emergency Drugs available, Suction available and Patient being monitored Patient Re-evaluated:Patient Re-evaluated prior to induction Oxygen Delivery Method: Circle system utilized Preoxygenation: Pre-oxygenation with 100% oxygen Induction Type: IV induction Ventilation: Mask ventilation without difficulty LMA: LMA inserted LMA Size: 4.0 Number of attempts: 1 Airway Equipment and Method: Bite block Placement Confirmation: positive ETCO2 Tube secured with: Tape Dental Injury: Teeth and Oropharynx as per pre-operative assessment

## 2019-08-05 NOTE — Progress Notes (Signed)
Assisted Dr. Ellender with left, ultrasound guided, pectoralis block. Side rails up, monitors on throughout procedure. See vital signs in flow sheet. Tolerated Procedure well. 

## 2019-08-05 NOTE — Op Note (Signed)
Preoperative diagnosis: Recurrent left axillary breast cancer  Postoperative diagnosis: Same  Procedure: Left axillary lymph node dissection level 1 and level 2 lymph nodes  Surgeon: Erroll Luna, MD  Anesthesia: General with pectoral block and 0.25% Sensorcaine local  EBL: Minimal  Specimen: Left axillary contents to pathology  Drains: 19 round drain to actual lymph node dissection site  Indications for procedure: The patient 78 year old female with a past history of left breast cancer treated with breast conserving surgery, radiation therapy and chemotherapy.  She developed a recurrence in her left axilla presents for left axillary lymph node dissection.  No primary focus was found in either breast by MRI.  She will be treated with local control with lymph node dissection since she can no longer have radiation therapy.  Risk, benefits and other options discussed with the patient.  Risk of nerve injury especially with ingrowth of tumor and potential shoulder and back weakness as well as winged scapula discussed.  We discussed potential major blood vessel injury and the need to dissect around major blood vessels.  We discussed pain and numbness in the area which may radiate down into the left arm.The procedure has been discussed with the patient.  Alternative therapies have been discussed with the patient.  Operative risks include bleeding,  Infection,  Organ injury,  Nerve injury,  Blood vessel injury,  DVT,  Pulmonary embolism,  Death,  And possible reoperation.  Medical management risks include worsening of present situation.  The success of the procedure is 50 -90 % at treating patients symptoms.  Discussed significant risk of left arm lymphedema and the need for physical therapy.  Discussed the rationale for surgery for local control reasons.  The patient understands and agrees to proceed.    Description of procedure: The patient was met in the holding area.  Left pectoral block was placed  anesthesia.  Questions were answered.  She was taken back operating.  She is placed upon the upon the OR table.  After induction of general esthesia, left axilla was prepped and draped in sterile fashion timeout was performed.  Appropriate evaluation administered.  Incision was made the left axillary crease.  Dissection was carried into the left axillary contents.  There is a scar from previous sentinel lymph node mapping before.  A very large bulky mass was encountered which were multiple lymph nodes that were matted.  These were level 1 and extend up behind the pectoralis major muscle.  We began dissection to the scar mobilize this large mass off the chest wall.  We able to continue this up under the pectoralis major muscle.  The axillary vein was identified and preserved.  This was invading the long thoracic nerve and this was sacrificed to resect the mass to a grossly negative margin.  This was taken between clips and divided.  The tumor was adherent to the thoracodorsal trunk but this was dissected away with a plane between the 2 without obvious tumor invasion.  Once the structures were freed I was able to finish the dissection by using clips to control the small bleeding vessels and large lymphatic channels.  This was removed.  Inspection of the axilla revealed no signs of bleeding.  Irrigation was used.  Through separate stab incision a 19 round drain was placed.  Secured with 2-0 nylon.  Wound closed then with 3-0 Vicryl and 4-0 Monocryl.  Dermabond applied.  Breast binder placed.  All counts were found to be correct.  The patient was awoke extubated taken  recovery in satisfactory condition.

## 2019-08-05 NOTE — Anesthesia Postprocedure Evaluation (Signed)
Anesthesia Post Note  Patient: Wendy [name]  Procedure(s) Performed: LEFT AXILLARY LYMPH NODE DISSECTION (Left Axilla)     Patient location during evaluation: PACU Anesthesia Type: Regional and General Level of consciousness: awake and alert Pain management: pain level controlled Vital Signs Assessment: post-procedure vital signs reviewed and stable Respiratory status: spontaneous breathing, nonlabored ventilation, respiratory function stable and patient connected to nasal cannula oxygen Cardiovascular status: blood pressure returned to baseline and stable Postop Assessment: no apparent nausea or vomiting Anesthetic complications: no    Last Vitals:  Vitals:   08/05/19 1230 08/05/19 1315  BP: (!) 162/85 (!) 167/93  Pulse: 89 90  Resp: 16 18  Temp:  36.9 C  SpO2: 100% 100%    Last Pain:  Vitals:   08/05/19 1315  TempSrc:   PainSc: 3                  [name]

## 2019-08-06 ENCOUNTER — Encounter (HOSPITAL_BASED_OUTPATIENT_CLINIC_OR_DEPARTMENT_OTHER): Payer: Self-pay | Admitting: Surgery

## 2019-08-06 ENCOUNTER — Encounter: Payer: Self-pay | Admitting: Internal Medicine

## 2019-08-06 NOTE — Telephone Encounter (Signed)
Unable to reach via telephone - letter sent

## 2019-08-08 ENCOUNTER — Telehealth: Payer: Self-pay | Admitting: Hematology

## 2019-08-08 LAB — SURGICAL PATHOLOGY

## 2019-08-08 NOTE — Telephone Encounter (Signed)
Confirmed 11/12 appointment with patient.

## 2019-08-11 ENCOUNTER — Encounter: Payer: Self-pay | Admitting: Internal Medicine

## 2019-08-11 ENCOUNTER — Other Ambulatory Visit: Payer: Self-pay | Admitting: Internal Medicine

## 2019-08-11 ENCOUNTER — Ambulatory Visit (INDEPENDENT_AMBULATORY_CARE_PROVIDER_SITE_OTHER): Payer: Medicare Other | Admitting: Internal Medicine

## 2019-08-11 ENCOUNTER — Other Ambulatory Visit: Payer: Self-pay

## 2019-08-11 DIAGNOSIS — E1165 Type 2 diabetes mellitus with hyperglycemia: Secondary | ICD-10-CM

## 2019-08-11 DIAGNOSIS — E669 Obesity, unspecified: Secondary | ICD-10-CM

## 2019-08-11 DIAGNOSIS — E785 Hyperlipidemia, unspecified: Secondary | ICD-10-CM

## 2019-08-11 DIAGNOSIS — E113419 Type 2 diabetes mellitus with severe nonproliferative diabetic retinopathy with macular edema, unspecified eye: Secondary | ICD-10-CM | POA: Diagnosis not present

## 2019-08-11 DIAGNOSIS — IMO0002 Reserved for concepts with insufficient information to code with codable children: Secondary | ICD-10-CM

## 2019-08-11 DIAGNOSIS — Z794 Long term (current) use of insulin: Secondary | ICD-10-CM | POA: Diagnosis not present

## 2019-08-11 MED ORDER — LANTUS SOLOSTAR 100 UNIT/ML ~~LOC~~ SOPN: PEN_INJECTOR | SUBCUTANEOUS | 3 refills | Status: DC

## 2019-08-11 MED ORDER — INSULIN REGULAR HUMAN 100 UNIT/ML IJ SOLN: 14.0000 [IU] | Freq: Three times a day (TID) | INTRAMUSCULAR | 11 refills | Status: DC

## 2019-08-11 NOTE — Telephone Encounter (Signed)
lantus not covered, ok to change to [place]?

## 2019-08-11 NOTE — Patient Instructions (Addendum)
Please continue: - Metformin 1000 mg 2x a day with meals  Please increase: - Lantus 24 >> 28 units in am - Novolin R before a meal: -14 units with a smaller meal -16 units with a regular meal -18  units with a large meal/if you eat out/if you have dessert  If you correct sugars at bedtime, do not use more than 10 units.  Please come back in 3-4 months with your sugar log

## 2019-08-11 NOTE — Telephone Encounter (Signed)
Actually, she gets Lantus from [facility] - let's ignore the request. I told her if they call her to tell them she does not need this. I sent the Rx by mistake to the pharmacy.

## 2019-08-11 NOTE — Progress Notes (Signed)
Patient ID: APRYLE STOWELL, female   DOB: May 23, 1941, 78 y.o.   MRN: [id number]  Patient location: Home My location: Office  Referring Provider: Marin Olp, MD  I connected with the patient on 08/11/19 at  1:02 PM EST by a video enabled telemedicine application and then by phone and verified that I am speaking with the correct person.   I discussed the limitations of evaluation and management by telemedicine and the availability of in person appointments. The patient expressed understanding and agreed to proceed.   Details of the encounter are shown below.  HPI: NOHEMI NICKLAUS is a 78 y.o.-year-old female, returning for f/u for DM2, dx in 1980s, insulin-dependent since 1990, uncontrolled, with complications (CKD, DR). Last visit 4 months ago (phone visit).  She has a diagnosis of breast cancer (since 2018) and she is status post left axillary lymph node dissection 6 days ago >> positive lymph nodes.  Reviewed HbA1c levels: Lab Results  Component Value Date   HGBA1C 8.4 (A) 07/24/2019   HGBA1C 8.8 (H) 04/01/2019   HGBA1C 8.6 (A) 09/05/2018    Pt is on: - Metformin 1000 mg 2x a day with meals - Lantus 28 >> 24 units in am - Novolin R before a meal: using 12-16 units -14 units with a smaller meal -16 units with a regular meal -18 units with a large meal/if you eat out/if you have dessert Lantus costs her 149$ per box >> she applied for financial assistance to get this free. She got this >> received the med.  Pt checks her sugars 4-6 times a day: - am:144-261 >> 123, 241-278 >> 70 x2, 120, 177-216 - 2h after b'fast:  n/c >> 220-344 >> 352 >> n/c - before lunch: n/c >> 280 >> 237-241 >> 141-216 - 2h after lunch:236-335, 460 >> 310-345 >> 207-300 - before dinner:  200 >> 81, 170-275 >> n/c  - 2h after dinner:244, 298, 384  >> 300-344 >> 153-300s, 422 (may correct with 12-16 units) - bedtime: 180, 244-387 >> 275, 393, 394, 407 >> same - nighttime:  73 x1, 130 >> n/c >> 312 >> 4  am: 140-190 >> 153-253 Lowest sugar was 32 >> .Marland Kitchen. 144 >> 120 >> 70; she has hypoglycemia awareness in the 70s Highest sugar was 400s >> 300s >> 300.  Glucometer: [employer] Voice  Pt's meals are: - Breakfast: toast, egg, bacon/sausage; oatmeal - Lunch: [place] sandwich, soups, crackers - Dinner: baked chicken, potatoes, cabbage (dinner is her largest meal) - Snacks: PB and crackers  -+ CKD, last BUN/creatinine:  Lab Results  Component Value Date   BUN 20 07/10/2019   CREATININE 1.38 (H) 07/10/2019  On on lisinopril. -+ HL; last set of lipids: Lab Results  Component Value Date   CHOL 183 04/01/2019   HDL 55.70 04/01/2019   LDLCALC 93 04/01/2019   LDLDIRECT 140.0 09/12/2018   TRIG 171.0 (H) 04/01/2019   CHOLHDL 3 04/01/2019  On lovastatin 40. 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  She has a history of laser surgery. She usually sees Dr. Kathlen Mody.   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  She has breast cancer and had left breast lumpectomy + chemotherapy for 4 weeks + radiotherapy.    Her husband died 05/15/2018.  They have been married for 55 years.  She started Buspar to help with grief/insomnia/anxiety.  ROS:  Constitutional: + weight gain/no weight loss, no fatigue, no subjective hyperthermia, no subjective  hypothermia Eyes: no blurry vision, no xerophthalmia ENT: no sore throat, no nodules palpated in neck, no dysphagia, no odynophagia, no hoarseness Cardiovascular: no CP/no SOB/no palpitations/no leg swelling Respiratory: no cough/no SOB/no wheezing Gastrointestinal: no N/no V/no D/no C/no acid reflux Musculoskeletal: no muscle aches/no joint aches Skin: no rashes, no hair loss Neurological: no tremors/no numbness/no tingling/no dizziness  I reviewed pt's medications, allergies, PMH, social hx, family hx, and changes were documented in the history of present illness. Otherwise, unchanged from my initial visit note.  Past Medical History:   Diagnosis Date  . Abnormal breast finding 2018   per pt/ having  a lot drainage from left breast nipple  . Anemia   . Anxiety   . Breast cancer ([place]) 11/2016   left/  . CKD (chronic kidney disease)   . Depression   . GERD (gastroesophageal reflux disease)    TUMS as needed  . HTN (hypertension)    states BP has been high recently; has been on med. x 20 yr.  . Hyperlipidemia   . Hyperplastic colon polyp   . HYPERTENSION, BENIGN SYSTEMIC 11/29/2006   Lisinopril hctz 10-12.69m, amlodipine 177m coreg 2543mID, clonidine 0.3 mg BID.   . IMarland Kitchensulin dependent diabetes mellitus   . Personal history of chemotherapy    2018/finished 6 weeks of chemo in Sep 2018  . Personal history of radiation therapy    2018 left breast/finished radiation in Sept 2018 per pt.   Past Surgical History:  Procedure Laterality Date  . AXILLARY LYMPH NODE DISSECTION Left 08/05/2019   Procedure: LEFT AXILLARY LYMPH NODE DISSECTION;  Surgeon: CorErroll LunaD;  [facility];  Laterality: Left;  . BREAST LUMPECTOMY Left 05/05/2008  . BREAST LUMPECTOMY Left 12/07/2016   malignant  . BREAST LUMPECTOMY WITH RADIOACTIVE SEED AND SENTINEL LYMPH NODE BIOPSY Left 12/07/2016   Procedure: LEFT BREAST LUMPECTOMY WITH RADIOACTIVE SEED AND SENTINEL LYMPH NODE BIOPSY;  Surgeon: ThoErroll LunaD;  [facility];  Laterality: Left;  . IR FLUORO GUIDE PORT INSERTION RIGHT  01/04/2017  . IR US [place] VASC ACCESS RIGHT  01/04/2017  . PORT-A-CATH REMOVAL N/A 05/30/2017   Procedure: REMOVAL PORT-A-CATH;  Surgeon: CorErroll LunaD;  [facility];  Laterality: N/A;  . RE-EXCISION OF BREAST LUMPECTOMY Left 12/28/2016   Procedure: RE-EXCISION OF BREAST LUMPECTOMY;  Surgeon: ThoErroll LunaD;  [facility];  Service: General;  Laterality: Left;   Social History   Social History  . Marital Status: Married     Spouse Name: N/A  . Number of Children: 4   . retired    Social History Main Topics  . Smoking status: Never Smoker   . Smokeless tobacco: Former UseSystems developer Types: Snuff    Quit date: 10/02/2014     Comment: Quit 10/02/2014  previous 1 box per week of snuff   . Alcohol Use: No  . Drug Use: No   Current Outpatient Medications on File Prior to Visit  Medication Sig Dispense Refill  . amLODipine (NORVASC) 10 MG tablet TAKE 1 TABLET BY MOUTH EVERY DAY 90 tablet 1  . aspirin 81 MG tablet Take 81 mg by mouth daily.      . B-D INS SYRINGE 0.5CC/31GX5/16 31G X 5/16" 0.5 ML MISC USE AS DIRECTED 4 TIMES A DAY 300 each 1  . busPIRone (BUSPAR) 10 MG tablet Take 1 tablet (10 mg total) by  mouth 3 (three) times daily. 270 tablet 0  . carvedilol (COREG) 25 MG tablet Take 1 tablet (25 mg total) by mouth 2 (two) times daily with a meal. 180 tablet 2  . cloNIDine (CATAPRES) 0.3 MG tablet TAKE 1 TABLET (0.3 MG TOTAL) BY MOUTH 2 (TWO) TIMES DAILY. 180 tablet 1  . HYDROcodone-acetaminophen (NORCO/VICODIN) 5-325 MG tablet Take 1 tablet by mouth every 6 (six) hours as needed for moderate pain. 15 tablet 0  . Insulin Glargine (LANTUS SOLOSTAR) 100 UNIT/ML Solostar Pen Inject 24 units into the skin 10 pen 5  . Insulin Pen Needle 31G X 5 MM MISC Use pen needles for insulin injection daily 100 each 3  . insulin regular (NOVOLIN R,HUMULIN R) 100 units/mL injection Inject 0.1-0.16 mLs (10-16 Units total) into the skin 3 (three) times daily before meals. Relion. Please provide insulin syringes needed 20 mL 11  . Insulin Syringe-Needle U-100 (B-D INS SYRINGE 0.5CC/30GX1/2") 30G X 1/2" 0.5 ML MISC USE AS DIRECTED 4 TIMES A DAY 300 each 3  . letrozole (FEMARA) 2.5 MG tablet TAKE 1 TABLET BY MOUTH EVERY DAY 90 tablet 1  . lisinopril-hydrochlorothiazide (ZESTORETIC) 10-12.5 MG tablet TAKE 1 TABLET BY MOUTH EVERY DAY 90 tablet 3  . lovastatin (MEVACOR) 40 MG tablet Take 1 tablet (40 mg total) by mouth at bedtime. 90 tablet 3  .  metFORMIN (GLUCOPHAGE) 500 MG tablet TAKE 2 TABLETS BY MOUTH DAILY WITH SUPPER. 180 tablet 2  . OVER THE COUNTER MEDICATION Take 1 tablet by mouth daily as needed. Stool softner for constipation, also drinks warm prune juice prn    . polyethylene glycol powder (GLYCOLAX/MIRALAX) 17 GM/SCOOP powder Take 17 g by mouth daily. 1 g 0  . SUPREP BOWEL PREP KIT 17.5-3.13-1.6 GM/177ML SOLN Take 1 kit by mouth as directed. For colonoscopy prep 354 mL 0  . traMADol (ULTRAM) 50 MG tablet TAKE 1 TABLET BY MOUTH EVERY 6 (SIX) HOURS AS NEEDED FOR MODERATE PAIN OR SEVERE PAIN (NECK AND SHOULDER PAIN). 20 tablet 0  . traZODone (DESYREL) 50 MG tablet Take 0.5-1 tablets (25-50 mg total) by mouth at bedtime as needed for sleep. 30 tablet 3   No current facility-administered medications on file prior to visit.    No Known Allergies Family History  Problem Relation Age of Onset  . Diabetes Sister        x 4  . Heart failure Sister   . Diabetes Brother   . Heart disease Brother   . Diabetes Paternal Grandmother   . Diabetes Paternal Aunt   . Heart attack Brother   . Arthritis Sister   . Hypertension Sister   . Diabetes Sister   . Emphysema Brother   . Cancer Father 80       brain tumor   . Diabetes Child   . Asthma Child   . Hypertension Child   . Diabetes Child   . Diabetes Child   . Colon cancer Neg Hx   . Stomach cancer Neg Hx    PE: LMP  (LMP Unknown)  There is no height or weight on file to calculate BMI. Wt Readings from Last 3 Encounters:  08/05/19 195 lb 5.2 oz (88.6 kg)  07/24/19 195 lb 9.6 oz (88.7 kg)  07/10/19 195 lb 3.2 oz (88.5 kg)   Constitutional:  in NAD  The physical exam was not performed (phone visit).  ASSESSMENT: 1. 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 HL  3. Obesity  PLAN:  1.  Patient with longstanding, uncontrolled, type 2 diabetes, on basal-bolus insulin regimen and Metformin.  She has a  history of noncompliance with the recommended regimen due to finances.  In the past, we discussed about switching to a more affordable regimen of NPH and regular insulin from [facility] but she applied for financial assistance and now she has Lantus.  She uses Novolin R for short-acting insulin. -At last visit, sugars were still high as she was not taking enough mealtime insulin and, subsequently, sugars are high after meals and she was correcting them which plummeted her sugars especially during the night.  I again advised her to take enough Novolin before meals and we increase the doses at last visit.  She sugars are quite high, I also increased her Lantus dose. -We reviewed together her most recent HbA1c from 07/24/2019 and this was slightly lower, at 8.4%, decreased from 8.8% -At this visit, however, reviewing her regimen, she did not increase the doses of insulin as I advised her at last visit.  Sugars are still high, but slightly better than before.  She still has sugars in the higher 200s and 300s and even had 1 in the 400s since last visit.  I again advised her to increase Lantus and we will leave this in the morning.  She had to borderline low blood sugars in the 70s in the morning but I suspect that these happened after she overcorrected high blood sugars at that time.  Upon questioning, she is using a full mealtime dose of regular to correct bedtime sugars and I strongly advised her not to do so since she may drop her sugars too much overnight.  As of now, she is taking 16 units of regular insulin for correction and I advised her to not take more than 10 units.  However, we will go ahead and increase the dose of Lantus and Novolin to the doses suggested at last visit to hopefully improve her staircase pattern of increased blood sugars throughout the day - I suggested to:  Patient Instructions  Please continue: - Metformin 1000 mg 2x a day with meals  Please increase: - Lantus 24 >> 28 units in  am - Novolin R before a meal: -14 units with a smaller meal -16 units with a regular meal -18  units with a large meal/if you eat out/if you have dessert  Please come back in 3-4 months with your sugar log  - advised to check sugars at different times of the day - 3x a day, rotating check times - advised for yearly eye exams >> she is not UTD, but has an appt scheduled - return to clinic in 3-4 months    2. HL -Reviewed latest lipid panel from 03/2019: LDL improved, now <200, triglycerides slightly high Lab Results  Component Value Date   CHOL 183 04/01/2019   HDL 55.70 04/01/2019   LDLCALC 93 04/01/2019   LDLDIRECT 140.0 09/12/2018   TRIG 171.0 (H) 04/01/2019   CHOLHDL 3 04/01/2019  -Continues lovastatin 40 without side effects  3. Obesity -Gained approximately 10 pounds since last visit! -Continue Metformin which is weight stabilizing  - time spent with the patient: 21 min, of which >50% was spent in obtaining information about her symptoms, reviewing her previous labs, evaluations, and treatments, counseling her about her condition (please see the discussed topics above), and developing a plan to further investigate and treat it; she had a number of questions which I addressed.  Philemon Kingdom, MD PhD  stabilizinggy

## 2019-08-12 ENCOUNTER — Encounter: Payer: Self-pay | Admitting: Family Medicine

## 2019-08-12 ENCOUNTER — Ambulatory Visit (INDEPENDENT_AMBULATORY_CARE_PROVIDER_SITE_OTHER): Payer: Medicare Other | Admitting: Family Medicine

## 2019-08-12 VITALS — Ht 62.0 in | Wt 195.0 lb

## 2019-08-12 DIAGNOSIS — E1159 Type 2 diabetes mellitus with other circulatory complications: Secondary | ICD-10-CM | POA: Diagnosis not present

## 2019-08-12 DIAGNOSIS — F3342 Major depressive disorder, recurrent, in full remission: Secondary | ICD-10-CM | POA: Diagnosis not present

## 2019-08-12 DIAGNOSIS — I10 Essential (primary) hypertension: Secondary | ICD-10-CM | POA: Diagnosis not present

## 2019-08-12 NOTE — Patient Instructions (Signed)
There are no preventive care reminders to display for this patient.

## 2019-08-12 NOTE — Assessment & Plan Note (Signed)
#   Depression/anxiety S: Despite recent stress of her current breast cancer-patient is doing reasonably well.  She states she feels similar to when last PHQ-9 was completed other than sleep has improved on trazodone.  She is using buspirone for anxiety with reasonable control Depression screen [facility] 2/9 07/24/2019  Decreased Interest 0  Down, Depressed, Hopeless 0  PHQ - 2 Score 0  Altered sleeping 3  Tired, decreased energy 1  Change in appetite 0  Feeling bad or failure about yourself  0  Trouble concentrating 0  Moving slowly or fidgety/restless 0  Suicidal thoughts 0  PHQ-9 Score 4  Difficult doing work/chores Not difficult at all  Some recent data might be hidden  A/P: Depression appears to be in full remission -continue treatment for anxiety and sleep which seem to also control her depression as a result.

## 2019-08-12 NOTE — Progress Notes (Signed)
Phone [phone] Virtual visit via Video note   Subjective:  Chief complaint: Chief Complaint  Patient presents with   Follow-up   This visit type was conducted due to national recommendations for restrictions regarding the COVID-19 Pandemic (e.g. social distancing).  This format is felt to be most appropriate for this patient at this time balancing risks to patient and risks to population by having him in for in person visit.  No physical exam was performed (except for noted visual exam or audio findings with Telehealth visits).    Our team/I connected with Wendy [name] at 10:00 AM EST by a video enabled telemedicine application ([email or web address] or caregility through epic) and verified that I am speaking with the correct person using two identifiers.  Location patient: [place] Location provider: [facility], office Persons participating in the virtual visit:  patient  Our team/I discussed the limitations of evaluation and management by telemedicine and the availability of in person appointments. In light of current covid-19 pandemic, patient also understands that we are trying to protect them by minimizing in office contact if at all possible.  The patient expressed consent for telemedicine visit and agreed to proceed. Patient understands insurance will be billed.   ROS- no fever, chills, cough, congestion. No SI.    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, with long-term current use of insulin ([place]) 11/02/2015    Priority: High   Diastolic dysfunction 93/71/6967    Priority: High   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[place]) 12/05/2018    Priority: Medium   Rectal  bleeding 01/17/2017    Priority: Medium   CKD (chronic kidney disease) stage 3, GFR 30-59 ml/min ([place]) 03/17/2016    Priority: Medium   Osteopenia 01/05/2016    Priority: Medium   TOBACCO USE 04/13/2008    Priority: Medium   Hyperlipidemia associated with type 2 diabetes mellitus ([place]) 11/29/2006    Priority: Medium   OBESITY, BMI 30-35 11/29/2006    Priority: Medium   Hypertension associated with diabetes ([place]) 11/29/2006    Priority: Medium   Fatigue 03/14/2017    Priority: Low   Edema 03/14/2017    Priority: Low   Port catheter in place 01/18/2017    Priority: Low   Constipation 01/17/2017    Priority: Low   Seasonal allergies 12/01/2013    Priority: Low   Grief 05/10/2018   Anemia 01/09/2018    Medications- reviewed and updated Current Outpatient Medications  Medication Sig Dispense Refill   amLODipine (NORVASC) 10 MG tablet TAKE 1 TABLET BY MOUTH EVERY DAY 90 tablet 1   aspirin 81 MG tablet Take 81 mg by mouth daily.       B-D INS SYRINGE 0.5CC/31GX5/16 31G X 5/16" 0.5 ML MISC USE AS DIRECTED 4 TIMES A DAY 300 each 1   busPIRone (BUSPAR) 10 MG tablet Take 1 tablet (10 mg total) by mouth 3 (three) times daily. 270 tablet 0   carvedilol (COREG) 25 MG tablet Take 1 tablet (25 mg total) by mouth 2 (two) times daily with a meal. 180 tablet 2   cloNIDine (CATAPRES) 0.3 MG tablet TAKE 1 TABLET (0.3 MG TOTAL) BY MOUTH 2 (TWO) TIMES DAILY. 180 tablet  1   HYDROcodone-acetaminophen (NORCO/VICODIN) 5-325 MG tablet Take 1 tablet by mouth every 6 (six) hours as needed for moderate pain. 15 tablet 0   Insulin Glargine (LANTUS SOLOSTAR) 100 UNIT/ML Solostar Pen Inject 28 units into the skin in am 10 pen 3   Insulin Pen Needle 31G X 5 MM MISC Use pen needles for insulin injection daily 100 each 3   insulin regular (NOVOLIN R) 100 units/mL injection Inject 0.14-0.18 mLs (14-18 Units total) into the skin 3 (three) times daily before meals. [place]. Please provide  insulin syringes needed 20 mL 11   Insulin Syringe-Needle U-100 (B-D INS SYRINGE 0.5CC/30GX1/2") 30G X 1/2" 0.5 ML MISC USE AS DIRECTED 4 TIMES A DAY 300 each 3   letrozole (FEMARA) 2.5 MG tablet TAKE 1 TABLET BY MOUTH EVERY DAY 90 tablet 1   lisinopril-hydrochlorothiazide (ZESTORETIC) 10-12.5 MG tablet TAKE 1 TABLET BY MOUTH EVERY DAY 90 tablet 3   lovastatin (MEVACOR) 40 MG tablet Take 1 tablet (40 mg total) by mouth at bedtime. 90 tablet 3   metFORMIN (GLUCOPHAGE) 500 MG tablet TAKE 2 TABLETS BY MOUTH DAILY WITH SUPPER. 180 tablet 2   OVER THE COUNTER MEDICATION Take 1 tablet by mouth daily as needed. Stool softner for constipation, also drinks warm prune juice prn     polyethylene glycol powder (GLYCOLAX/MIRALAX) 17 GM/SCOOP powder Take 17 g by mouth daily. 1 g 0   traMADol (ULTRAM) 50 MG tablet TAKE 1 TABLET BY MOUTH EVERY 6 (SIX) HOURS AS NEEDED FOR MODERATE PAIN OR SEVERE PAIN (NECK AND SHOULDER PAIN). 20 tablet 0   traZODone (DESYREL) 50 MG tablet Take 0.5-1 tablets (25-50 mg total) by mouth at bedtime as needed for sleep. 30 tablet 3   No current facility-administered medications for this visit.      Objective:  Ht 5\' 2"  (1.575 m)    Wt 195 lb (88.5 kg)    LMP  (LMP Unknown)    BMI 35.67 kg/m  self reported vitals Gen: NAD, resting comfortably Lungs: nonlabored     Assessment and Plan   #hypertension S: compliant with clonidine 0.3 mg twice daily, carvedilol 25 mg twice a day, amlodipine 10 mg daily, lisinopril hydrochlorothiazide 10-12.5 mg daily BP Readings from Last 3 Encounters:  08/05/19 (!) 167/93  07/24/19 130/70  07/10/19 140/68  A/P: Poor control after recent surgery-she states has been controlled at home but does not remember the number-is going to get family to recheck blood pressure and update [name] with reading   #Breast cancer recurrence-Patient had surgery on 08/05/2019. She has follow up on Friday with oncology. She is doing well no problems or concerns  at this time.  Still with some left arm pain-using hydrocodone if significant pain.  Also has hydrocodone if needed.  Unfortunately on pathology there were multiple lymph nodes involved so we will be discussing potential chemotherapy options with oncology  # Depression/anxiety S: Despite recent stress of her current breast cancer-patient is doing reasonably well.  She states she feels similar to when last PHQ-9 was completed other than sleep has improved on trazodone.  She is using buspirone for anxiety with reasonable control Depression screen [facility] 2/9 07/24/2019  Decreased Interest 0  Down, Depressed, Hopeless 0  PHQ - 2 Score 0  Altered sleeping 3  Tired, decreased energy 1  Change in appetite 0  Feeling bad or failure about yourself  0  Trouble concentrating 0  Moving slowly or fidgety/restless 0  Suicidal thoughts 0  PHQ-9 Score  4  Difficult doing work/chores Not difficult at all  Some recent data might be hidden  A/P: Depression appears to be in full remission -continue treatment for anxiety and sleep which seem to also control her depression as a result.  Recommended follow up: 4 to 28-month follow-up recommended or sooner if needed Future Appointments  Date Time Provider [place]  08/13/2019 11:00 AM Cottle, Bambi G, LCSW [facility] None  08/14/2019  1:40 PM [name], MD [facility] None  10/06/2019 11:00 AM [facility] LAB 4 [facility] None  10/06/2019 11:20 AM [name], MD [facility] None   Lab/Order associations:   ICD-10-CM   1. Hypertension associated with diabetes ([place])  E11.59    I10   2. Recurrent major depressive disorder, in full remission ([place])  F33.42    Return precautions advised.  Garret Reddish, MD

## 2019-08-13 ENCOUNTER — Ambulatory Visit (INDEPENDENT_AMBULATORY_CARE_PROVIDER_SITE_OTHER): Payer: Medicare Other | Admitting: Psychology

## 2019-08-13 DIAGNOSIS — F330 Major depressive disorder, recurrent, mild: Secondary | ICD-10-CM

## 2019-08-13 DIAGNOSIS — F411 Generalized anxiety disorder: Secondary | ICD-10-CM

## 2019-08-13 NOTE — Progress Notes (Signed)
[place]   Telephone:([phone]) [phone] [phone]) [phone]   Clinic Follow up Note   Patient Care Team: Marin Olp, MD as PCP - General (Family Medicine) Philemon Kingdom, MD as Consulting Physician (Internal Medicine) Jalene Mullet, MD as Consulting Physician (Ophthalmology) Erroll Luna, MD as Consulting Physician (General Surgery) Kyung Rudd, MD as Consulting Physician (Radiation Oncology) [name], MD as Consulting Physician (Hematology) Gardenia Phlegm, NP as Nurse Practitioner (Hematology and Oncology)  Date of Service:  08/14/2019  CHIEF COMPLAINT: Follow Up for Invasive Ductal Carcinoma of the left breast  SUMMARY OF ONCOLOGIC HISTORY: Oncology History Overview Note  Cancer Staging Malignant neoplasm of upper-outer quadrant of left breast in female, estrogen receptor positive (Wendy [name]) Staging form: Breast, AJCC 8th Edition - Clinical: Stage IIB (cT2, cN0, cM0, G3, ER: Positive, PR: Negative, HER2: Negative) - Signed by [name], MD on 11/23/2016 - Pathologic stage from 12/28/2016: Stage IIB (pT2, pN1a(sn), cM0, G3, ER: Positive, PR: Negative, HER2: Negative) - Signed by [name], MD on 01/02/2017     Malignant neoplasm of upper-outer quadrant of left breast in female, estrogen receptor positive (Wendy [name])  11/01/2016 Mammogram   Diagnostic mammogram and ultrasound of left breast and axilla showed a 3.1 x 2.1 x 1.4 cm (3.3 x 2.0 x 2.7 cm by ultrasound) mass in the upper outer quadrant of the anterior third of the left breast, associated with pleomorphic calcification. There is a 8 mm (1.8cm by [name]) prominent lymph node in the left axilla.   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   11/07/2016 Receptors her2   ER 80% positive, PR negative, HER-2 negative, Ki-67 90%   11/20/2016 Initial Diagnosis   Malignant neoplasm of upper-outer quadrant of left  breast in female, estrogen receptor positive ([place])   12/07/2016 Surgery   Left lumpectomy and left axillary sentinel lymph node sampling by Dr. Brantley Stage   12/07/2016 Pathology Results   pT2, pN1 Left Lumpectomy: Grade 3 IDC measuring 3.4 cm, carcinoma broadly present at the superior margin. Grade 3 DCIS. 1 out of 2 left axillary SLN positive for metastatic carcinoma   12/07/2016 Miscellaneous   Mammaprint showed high risk disease, basal type    12/28/2016 Surgery   Re-excision of the previously positive superior margin was negative for malignant cells.    01/04/2017 Surgery   Port inserted   01/18/2017 - 03/22/2017 Chemotherapy   Adjuvant Docetaxel 75 mg/m and Cytoxan 600 mg/m, every 21 days, for total of 4 cycles, with Neulasta on day 2.    04/23/2017 - 05/18/2017 Radiation Therapy   Radiation treatment dates:   04/23/17 - 05/18/17 Administered by Dr. Lisbeth Renshaw  Site/dose:    Left breast/ 42.5 Gy in 17 Fx Boost / 7.5 Gy in 3 Fx   06/12/2017 -  Anti-estrogen oral therapy   Adjuvant letrozole 1 mg daily, plan for 5-7 years    07/20/2017 Mammogram   IMPRESSION: No mammographic evidence of malignancy in either breast. 3.8 cm left breast postsurgical loculated seroma.   09/12/2017 Survivorship     09/19/2018 Imaging   Baseline DEXA 09/19/18 ASSESSMENT: The BMD measured at Femur Neck Left is 0.888 g/cm2 with a T-score of -1.1. This patient is considered osteopenic according to [facility]) criteria.   The scan quality is good. L-4 was excluded due to degenerative changes.   Site Region Measured Date Measured Age YA BMD Significant CHANGE T-score  AP Spine  L1-L3      09[age]19/[age]         -0.7    1.084 g/cm2   DualFemur Neck Left  09/19/2018    [age]         -1.1    0.888 g/cm2   DualFemur Total Mean 09/19/2018    [age]         -0.5    0.946 g/cm2 ASSESSMENT: The probability of a major osteoporotic fracture is 4.5 % within the next ten years.   The probability  of hip fracture is 0.8  % within the next 10 years.   06/25/2019 Mammogram   Diagnostic mammogram 06/25/19  IMPRESSION: 1.  Two morphologically abnormal lymph nodes in the left axilla with a lymph node in the lower left axilla measuring 2.4 x 2 x 2.6 cm.   2. Stable lumpectomy changes left breast with no findings of malignancy in either breast.   07/01/2019 Pathology Results   Diagnosis Lymph node, needle/core biopsy, left axilla - POORLY DIFFERENTIATED CARCINOMA CONSISTENT WITH BREAST PRIMARY. - NO LYMPHOID NODAL TISSUE IDENTIFIED. - SEE MICROSCOPIC DESCRIPTION. Microscopic Comment The carcinoma is consistent with grade III.  PROGNOSTIC INDICATORS Results: IMMUNOHISTOCHEMICAL AND MORPHOMETRIC ANALYSIS PERFORMED MANUALLY The tumor cells are NEGATIVE for Her2 (1+). Estrogen Receptor: 80%, POSITIVE, WEAK STAINING INTENSITY Progesterone Receptor: 0%, NEGATIVE Proliferation Marker Ki67: 80%   07/18/2019 Breast MRI   Breast MRI 07/18/19  IMPRESSION: 1. Two morphologically abnormal level 1 left axillary lymph nodes correlating with biopsy-proven malignancy. 2. No MRI evidence of malignancy in either breast. 3. Postsurgical changes of the left breast consistent with prior lumpectomy.   07/25/2019 PET scan   PET 07/25/19  IMPRESSION: 1. Hypermetabolic LEFT axillary mass consistent with metastatic breast cancer. 2. High LEFT axillary/posterior triangle (level 5) lymph node consistent with local metastasis. 3. No central mediastinal nodal metastasis. No suspicious pulmonary nodules. 4. No distant soft tissue metastasis or skeletal metastasis. 5. Calcified fibroid uterus   08/05/2019 Surgery   LEFT AXILLARY LYMPH NODE DISSECTION by Dr. Brantley Stage 08/05/19    08/05/2019 Pathology Results   FINAL MICROSCOPIC DIAGNOSIS: 08/05/19    A. LYMPH NODES, LEFT AXILLARY, DISSECTION:  - Metastatic carcinoma.  - See comment.       CURRENT THERAPY:  Adjuvant Letrozole, started 06/12/17   INTERVAL HISTORY:  Wendy [name] is here for a follow up of left breast. She presents to the clinic with family member. She notes her surgery went well. She notes she had left lymphedema before surgery and this is stable. She has left arm soreness. She takes hydrocodone as needed only and controls her pain.     REVIEW OF SYSTEMS:   Constitutional: Denies fevers, chills or abnormal weight loss Eyes: Denies blurriness of vision Ears, nose, mouth, throat, and face: Denies mucositis or sore throat Respiratory: Denies cough, dyspnea or wheezes Cardiovascular: Denies palpitation, chest discomfort or lower extremity swelling Gastrointestinal:  Denies nausea, heartburn or change in bowel habits Skin: Denies abnormal skin rashes Lymphatics: Denies new lymphadenopathy or easy bruising Neurological:Denies numbness, tingling or new weaknesses Behavioral/Psych: Mood is stable, no new changes  Breast: (+) Left arm lymphedema  All other systems were reviewed with the patient and are negative.  MEDICAL HISTORY:  Past Medical History:  Diagnosis Date  . Abnormal breast finding 2018   per pt/ having  a lot drainage from left breast nipple  . Anemia   . Anxiety   . Breast cancer ([place]) 11/2016  left/  . CKD (chronic kidney disease)   . Depression   . GERD (gastroesophageal reflux disease)    TUMS as needed  . HTN (hypertension)    states BP has been high recently; has been on med. x 20 yr.  . Hyperlipidemia   . Hyperplastic colon polyp   . HYPERTENSION, BENIGN SYSTEMIC 11/29/2006   Lisinopril hctz 10-12.71m, amlodipine 168m coreg 2563mID, clonidine 0.3 mg BID.   . IMarland Kitchensulin dependent diabetes mellitus   . Personal history of chemotherapy    2018/finished 6 weeks of chemo in Sep 2018  . Personal history of radiation therapy    2018 left breast/finished radiation in Sept 2018 per pt.    SURGICAL HISTORY: Past Surgical History:  Procedure Laterality Date  . AXILLARY LYMPH NODE DISSECTION  Left 08/05/2019   Procedure: LEFT AXILLARY LYMPH NODE DISSECTION;  Surgeon: CorErroll LunaD;  [facility];  Laterality: Left;  . BREAST LUMPECTOMY Left 05/05/2008  . BREAST LUMPECTOMY Left 12/07/2016   malignant  . BREAST LUMPECTOMY WITH RADIOACTIVE SEED AND SENTINEL LYMPH NODE BIOPSY Left 12/07/2016   Procedure: LEFT BREAST LUMPECTOMY WITH RADIOACTIVE SEED AND SENTINEL LYMPH NODE BIOPSY;  Surgeon: ThoErroll LunaD;  [facility];  Laterality: Left;  . IR FLUORO GUIDE PORT INSERTION RIGHT  01/04/2017  . IR US [place] VASC ACCESS RIGHT  01/04/2017  . PORT-A-CATH REMOVAL N/A 05/30/2017   Procedure: REMOVAL PORT-A-CATH;  Surgeon: CorErroll LunaD;  [facility];  Laterality: N/A;  . RE-EXCISION OF BREAST LUMPECTOMY Left 12/28/2016   Procedure: RE-EXCISION OF BREAST LUMPECTOMY;  Surgeon: ThoErroll LunaD;  [facility];  Laterality: Left;    I have reviewed the social history and family history with the patient and they are unchanged from previous note.  ALLERGIES:  has No Known Allergies.  MEDICATIONS:  Current Outpatient Medications  Medication Sig Dispense Refill  . amLODipine (NORVASC) 10 MG tablet TAKE 1 TABLET BY MOUTH EVERY DAY 90 tablet 1  . aspirin 81 MG tablet Take 81 mg by mouth daily.      . B-D INS SYRINGE 0.5CC/31GX5/16 31G X 5/16" 0.5 ML MISC USE AS DIRECTED 4 TIMES A DAY 300 each 1  . busPIRone (BUSPAR) 10 MG tablet Take 1 tablet (10 mg total) by mouth 3 (three) times daily. 270 tablet 0  . carvedilol (COREG) 25 MG tablet Take 1 tablet (25 mg total) by mouth 2 (two) times daily with a meal. 180 tablet 2  . cloNIDine (CATAPRES) 0.3 MG tablet TAKE 1 TABLET (0.3 MG TOTAL) BY MOUTH 2 (TWO) TIMES DAILY. 180 tablet 1  . HYDROcodone-acetaminophen (NORCO/VICODIN) 5-325 MG tablet Take 1 tablet by mouth every 6 (six) hours as needed for  moderate pain. 15 tablet 0  . Insulin Glargine (LANTUS SOLOSTAR) 100 UNIT/ML Solostar Pen Inject 28 units into the skin in am 10 pen 3  . Insulin Pen Needle 31G X 5 MM MISC Use pen needles for insulin injection daily 100 each 3  . insulin regular (NOVOLIN R) 100 units/mL injection Inject 0.14-0.18 mLs (14-18 Units total) into the skin 3 (three) times daily before meals. Relion. Please provide insulin syringes needed 20 mL 11  . Insulin Syringe-Needle U-100 (B-D INS SYRINGE 0.5CC/30GX1/2") 30G X 1/2" 0.5 ML MISC USE AS DIRECTED 4 TIMES A DAY 300 each 3  . letrozole (FEMARA) 2.5 MG tablet TAKE 1 TABLET  BY MOUTH EVERY DAY 90 tablet 1  . lisinopril-hydrochlorothiazide (ZESTORETIC) 10-12.5 MG tablet TAKE 1 TABLET BY MOUTH EVERY DAY 90 tablet 3  . lovastatin (MEVACOR) 40 MG tablet Take 1 tablet (40 mg total) by mouth at bedtime. 90 tablet 3  . metFORMIN (GLUCOPHAGE) 500 MG tablet TAKE 2 TABLETS BY MOUTH DAILY WITH SUPPER. 180 tablet 2  . OVER THE COUNTER MEDICATION Take 1 tablet by mouth daily as needed. Stool softner for constipation, also drinks warm prune juice prn    . polyethylene glycol powder (GLYCOLAX/MIRALAX) 17 GM/SCOOP powder Take 17 g by mouth daily. 1 g 0  . traMADol (ULTRAM) 50 MG tablet TAKE 1 TABLET BY MOUTH EVERY 6 (SIX) HOURS AS NEEDED FOR MODERATE PAIN OR SEVERE PAIN (NECK AND SHOULDER PAIN). 20 tablet 0  . traZODone (DESYREL) 50 MG tablet Take 0.5-1 tablets (25-50 mg total) by mouth at bedtime as needed for sleep. 30 tablet 3  . exemestane (AROMASIN) 25 MG tablet Take 1 tablet (25 mg total) by mouth daily after breakfast. 30 tablet 2   No current facility-administered medications for this visit.     PHYSICAL EXAMINATION: ECOG PERFORMANCE STATUS: 2 - Symptomatic, <50% confined to bed  Vitals:   08/14/19 1347  BP: (!) 144/69  Pulse: 77  Resp: 17  Temp: 98.3 F (36.8 C)  SpO2: 100%   Filed Weights   08/14/19 1347  Weight: 201 lb 8 oz (91.4 kg)    GENERAL:alert, no  distress and comfortable SKIN: skin color, texture, turgor are normal, no rashes or significant lesions EYES: normal, Conjunctiva are pink and non-injected, sclera clear  NECK: supple, thyroid normal size, non-tender, without nodularity LYMPH:  no palpable lymphadenopathy in the cervical, axillary  LUNGS: clear to auscultation and percussion with normal breathing effort HEART: regular rate & rhythm and no murmurs and no lower extremity edema ABDOMEN:abdomen soft, non-tender and normal bowel sounds Musculoskeletal:no cyanosis of digits and no clubbing  NEURO: alert & oriented x 3 with fluent speech, no focal motor/sensory deficits BREAST: S/p left lumpectomy and left axillary dissection: Surgical incisions healed well in breast and axillary incision healing well with draining tube in place. No palpable mass, nodules or adenopathy bilaterally. Breast exam benign.   LABORATORY DATA:  I have reviewed the data as listed CBC Latest Ref Rng & Units 07/10/2019 04/02/2019 04/01/2019  WBC 4.0 - 10.5 K/uL 6.8 - 6.4  Hemoglobin 12.0 - 15.0 g/dL 10.6(L) - 11.3(L)  Hematocrit 36.0 - 46.0 % 31.3(L) 33.6(L) 33.9(L)  Platelets 150 - 400 K/uL 243 - 281.0     CMP Latest Ref Rng & Units 07/10/2019 04/01/2019 02/06/2019  Glucose 70 - 99 mg/dL 233(H) 312(H) 276(H)  BUN 8 - 23 mg/dL _0 Creatinine 0.44 - 1.00 mg/dL 1.38(H) 1.30(H) 1.01(H)  Sodium 135 - 145 mmol/L 141 136 137  Potassium 3.5 - 5.1 mmol/L 4.2 4.2 4.6  Chloride 98 - 111 mmol/L 103 98 102  CO2 22 - 32 mmol/L 29 29 21(L)  Calcium 8.9 - 10.3 mg/dL 9.1 9.3 9.3  Total Protein 6.5 - 8.1 g/dL 6.9 6.7 -  Total Bilirubin 0.3 - 1.2 mg/dL 0.2(L) 0.3 -  Alkaline Phos 38 - 126 U/L 92 95 -  AST 15 - 41 U/L 9(L) 7 -  ALT 0 - 44 U/L <6 5 -      RADIOGRAPHIC STUDIES: I have personally reviewed the radiological images as listed and agreed with the findings in the report. No results found.  ASSESSMENT & PLAN:  Wendy [name] is a 78 y.o. female with    1. Malignant neoplasm of upper-outer quadrant of left breast, Invasive Ductal Carcinoma, pT2pN1M0, stage IIB, ER+/PR-/HER2-, G3, mammaprint high risk, axillary recurrence in 06/2019 -She was initially diagnosed in 11/2016. She was treated with left lumpectomy and SLN sampling, path showed 1/2 positive LNs with broad margins. Mammaprint showed high risk, she went for re-excision which was negative for malignancy.  -Given positive LN and high risk for recurrence she completed 4 cycles of TC and adjuvant radiation.  -She started antiestrogen with Letrozole in 06/2017. Tolerated well.  -Unfortunately she had recent local recurrence in left axilla as seen on 06/2019 Mammogram and confirmed with 06/2019 biopsy. Grade III, ER+/PR-/HER2-.  -Her Breast MRI and PET scan were negative for breast or distant metastasis.  -She underwent Left axillary lymph node dissection by Dr. Brantley Stage on 08/05/19.  -We discussed her pathology shows complete resection of metastatic carcinoma what involved multiple matted LNs measuring 4.5cm.  -I discussed given the extent of LNs involvement her risk for breast cancer recurrence is high.  -Given she had disease recurrence when on Letrozole, we will d/c and switch her to exemestane. She is agreeable.  -Will do [facility] genomictesting on her surgical sample to see if she has ER mutation or other treatment options. If she does have ER mutation, will change her treatment to fulvestrant injections.   -Given her prior chemo treatment, and her advanced age, I do not recommend more chem at this point -I briefly discussed clinical trail option. Will screen her  -She is recovering from surgery well. She still has draining tube in place with output.  -F/u in 4-5 weeks   2. H/o breast cellulitis in 2019 -followed by Dr. Brantley Stage; s/p fluid aspiration and antibiotics -She missed her mammogram in 04/2018 due to seroma and cellulitis, she deferred the visit due to draining breast, pain, and  frequent visits related to this   3. Left arm numbness/tingling, weakness and shoulder pain, Left arm lymphedema  -developed in 04/2019, worked up by Dr. Yong Channel -sensation and vibratory sense intact, she has mild weakness and ROM limitation -I don't think this is neuropathy related to her previous chemo, could be related to large axillary LN or previous left breast surgery  -She still has left arm lymphedema especially s/p left axillary LN dissection. I recommend she should start PT soon.   4. HTN, DM, CKD III -stable -F/u with PCP  5. Mild anemia -mild, fluctuates -Denies GI or other bleeding  -Iron and TIBC, B12, MMA normal -She was to have colonoscopy scheduled with Dr. Hilarie Fredrickson CRC screening and evaluation of her anemia   6. Osteopenia  -09/2018 DEXA shows osteopenia.  -Will monitor on Letrozole    Plan:  -Stop Letrozole  -I will call in Exemestane today to start. -will request FO on her recent surgical sample -Lab and f/u in 4-5 weeks    No problem-specific Assessment & Plan notes found for this encounter.   No orders of the defined types were placed in this encounter.  All questions were answered. The patient knows to call the clinic with any problems, questions or concerns. No barriers to learning was detected. I spent 20 minutes counseling the patient face to face. The total time spent in the appointment was 25 minutes and more than 50% was on counseling and review of test results     [name], MD 08/14/2019   I, Joslyn Devon, am acting  as scribe for [name], MD.   I have reviewed the above documentation for accuracy and completeness, and I agree with the above.

## 2019-08-14 ENCOUNTER — Inpatient Hospital Stay: Payer: Medicare Other | Attending: Hematology | Admitting: Hematology

## 2019-08-14 ENCOUNTER — Telehealth: Payer: Self-pay | Admitting: Hematology

## 2019-08-14 ENCOUNTER — Encounter: Payer: Self-pay | Admitting: Hematology

## 2019-08-14 ENCOUNTER — Other Ambulatory Visit: Payer: Self-pay

## 2019-08-14 VITALS — BP 144/69 | HR 77 | Temp 98.3°F | Resp 17 | Ht 62.0 in | Wt 201.5 lb

## 2019-08-14 DIAGNOSIS — C50412 Malignant neoplasm of upper-outer quadrant of left female breast: Secondary | ICD-10-CM | POA: Diagnosis not present

## 2019-08-14 DIAGNOSIS — N183 Chronic kidney disease, stage 3 unspecified: Secondary | ICD-10-CM | POA: Insufficient documentation

## 2019-08-14 DIAGNOSIS — Z170 Estrogen receptor positive status [ER+]: Secondary | ICD-10-CM | POA: Diagnosis not present

## 2019-08-14 DIAGNOSIS — I129 Hypertensive chronic kidney disease with stage 1 through stage 4 chronic kidney disease, or unspecified chronic kidney disease: Secondary | ICD-10-CM | POA: Diagnosis not present

## 2019-08-14 DIAGNOSIS — Z79811 Long term (current) use of aromatase inhibitors: Secondary | ICD-10-CM | POA: Diagnosis not present

## 2019-08-14 DIAGNOSIS — Z9221 Personal history of antineoplastic chemotherapy: Secondary | ICD-10-CM | POA: Diagnosis not present

## 2019-08-14 DIAGNOSIS — E1122 Type 2 diabetes mellitus with diabetic chronic kidney disease: Secondary | ICD-10-CM | POA: Diagnosis not present

## 2019-08-14 DIAGNOSIS — M8580 Other specified disorders of bone density and structure, unspecified site: Secondary | ICD-10-CM | POA: Diagnosis not present

## 2019-08-14 MED ORDER — EXEMESTANE 25 MG PO TABS: 25.0000 mg | ORAL_TABLET | Freq: Every day | ORAL | 2 refills | Status: DC

## 2019-08-14 NOTE — Telephone Encounter (Signed)
Scheduled appt per 11/12 los.  Spoke with pt and she is aware of her appt date and time.

## 2019-08-17 ENCOUNTER — Other Ambulatory Visit: Payer: Self-pay | Admitting: Family Medicine

## 2019-08-17 DIAGNOSIS — I10 Essential (primary) hypertension: Secondary | ICD-10-CM

## 2019-08-20 ENCOUNTER — Other Ambulatory Visit: Payer: Self-pay | Admitting: Family Medicine

## 2019-08-20 ENCOUNTER — Telehealth: Payer: Self-pay

## 2019-08-20 ENCOUNTER — Other Ambulatory Visit: Payer: Self-pay | Admitting: Hematology

## 2019-08-20 MED ORDER — HYDROCODONE-ACETAMINOPHEN 5-325 MG PO TABS: 1.0000 | ORAL_TABLET | Freq: Three times a day (TID) | ORAL | 0 refills | Status: DC | PRN

## 2019-08-20 NOTE — Telephone Encounter (Signed)
Patient calls requesting refill on Hydrocodone last filled on 11/3 #15 (by Dr. Brantley Stage).  Still having some discomfort from her procedure a couple of weeks ago.   [phone]

## 2019-08-20 NOTE — Telephone Encounter (Signed)
I refilled #15, let her know I do not plan to refill, if she still has significant pain issue, call Dr. Brantley Stage. Thanks   [name] MD

## 2019-08-20 NOTE — Telephone Encounter (Signed)
Patient is checking status of medication refill Call back [phone]

## 2019-08-20 NOTE — Telephone Encounter (Signed)
See note

## 2019-08-27 ENCOUNTER — Other Ambulatory Visit: Payer: Self-pay | Admitting: Family Medicine

## 2019-09-01 ENCOUNTER — Telehealth: Payer: Self-pay

## 2019-09-01 ENCOUNTER — Encounter (HOSPITAL_COMMUNITY): Payer: Self-pay | Admitting: Hematology

## 2019-09-01 DIAGNOSIS — C50412 Malignant neoplasm of upper-outer quadrant of left female breast: Secondary | ICD-10-CM | POA: Diagnosis not present

## 2019-09-01 NOTE — Telephone Encounter (Signed)
We can give her Levemir, [place] or [employer]

## 2019-09-01 NOTE — Telephone Encounter (Signed)
2 boxes of Wendy [name] set aside for patient, she will pick up tomorrow and I have faxed in the [employer] PAP refill request.

## 2019-09-01 NOTE — Telephone Encounter (Signed)
Patient called in wanting to know if any paper work for her insulin Lantus has come through she is out as of now     Please call and advise

## 2019-09-01 NOTE — Telephone Encounter (Signed)
Patient did not contact us to let us know she was coming up due for refill soon.  I will send in the paperwork today but since she is out, are we able to give samples? I checked and we do not have Lantus can we give another?

## 2019-09-02 ENCOUNTER — Encounter: Payer: Self-pay | Admitting: Family Medicine

## 2019-09-02 ENCOUNTER — Telehealth: Payer: Self-pay | Admitting: Family Medicine

## 2019-09-02 NOTE — Telephone Encounter (Signed)
Please advise if needed, patient is scheduled for 12/2 at 10am for a virtual appt to discuss.   Copied from [place] [phone]. Topic: General - Other >> Sep 02, 2019  1:38 PM Leward Quan A wrote: Reason for CRM: Patient called to inform Dr Yong Channel that upon seeing her surgeon today he noticed that she is retaining fluids in her arms and hands per patient she can not close her hands because they are swollen. She want to iknow if there is something that Dr Yong Channel can prescribe to help her eliminate the excess fluid from her body. Patient would like a call back today if possible please at Ph#  [phone]

## 2019-09-02 NOTE — Telephone Encounter (Signed)
Pt has appointment tomorrow at 54.

## 2019-09-03 ENCOUNTER — Ambulatory Visit (INDEPENDENT_AMBULATORY_CARE_PROVIDER_SITE_OTHER): Payer: Medicare Other | Admitting: Family Medicine

## 2019-09-03 ENCOUNTER — Encounter: Payer: Self-pay | Admitting: Family Medicine

## 2019-09-03 VITALS — BP 150/96 | Temp 97.0°F | Ht 62.0 in | Wt 199.0 lb

## 2019-09-03 DIAGNOSIS — R0602 Shortness of breath: Secondary | ICD-10-CM

## 2019-09-03 DIAGNOSIS — E1159 Type 2 diabetes mellitus with other circulatory complications: Secondary | ICD-10-CM | POA: Diagnosis not present

## 2019-09-03 DIAGNOSIS — I890 Lymphedema, not elsewhere classified: Secondary | ICD-10-CM | POA: Diagnosis not present

## 2019-09-03 DIAGNOSIS — I10 Essential (primary) hypertension: Secondary | ICD-10-CM | POA: Diagnosis not present

## 2019-09-03 DIAGNOSIS — R609 Edema, unspecified: Secondary | ICD-10-CM | POA: Diagnosis not present

## 2019-09-03 MED ORDER — FUROSEMIDE 20 MG PO TABS: 20.0000 mg | ORAL_TABLET | Freq: Every day | ORAL | 0 refills | Status: DC | PRN

## 2019-09-03 NOTE — Progress Notes (Signed)
Phone [phone] Virtual visit via Video note   Subjective:  Chief complaint: Chief Complaint  Patient presents with   video visit   retaining fluid    This visit type was conducted due to national recommendations for restrictions regarding the COVID-19 Pandemic (e.g. social distancing).  This format is felt to be most appropriate for this patient at this time balancing risks to patient and risks to population by having him in for in person visit.  No physical exam was performed (except for noted visual exam or audio findings with Telehealth visits).    Our team/I connected with Wendy [name] at 10:00 AM EST by a video enabled telemedicine application ([email or web address] or caregility through epic) and verified that I am speaking with the correct person using two identifiers.  Location patient: [facility] Location provider: [facility], office Persons participating in the virtual visit:  patient  Our team/I discussed the limitations of evaluation and management by telemedicine and the availability of in person appointments. In light of current covid-19 pandemic, patient also understands that we are trying to protect them by minimizing in office contact if at all possible.  The patient expressed consent for telemedicine visit and agreed to proceed. Patient understands insurance will be billed.   ROS-no fever or chills.  Has some shortness of breath.  No reported chest pain.  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, with long-term current use of insulin ([place]) 11/02/2015    Priority: High   Diastolic dysfunction 43/15/4008    Priority: High     Lymphedema of left arm 09/03/2019    Priority: Medium   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[place]) 12/05/2018    Priority: Medium   Rectal bleeding 01/17/2017    Priority: Medium   CKD (chronic kidney disease) stage 3, GFR 30-59 ml/min ([place]) 03/17/2016    Priority: Medium   Osteopenia 01/05/2016    Priority: Medium   TOBACCO USE 04/13/2008    Priority: Medium   Hyperlipidemia associated with type 2 diabetes mellitus ([place]) 11/29/2006    Priority: Medium   OBESITY, BMI 30-35 11/29/2006    Priority: Medium   Hypertension associated with diabetes ([place]) 11/29/2006    Priority: Medium   Fatigue 03/14/2017    Priority: Low   Edema 03/14/2017    Priority: Low   Port catheter in place 01/18/2017    Priority: Low   Constipation 01/17/2017    Priority: Low   Seasonal allergies 12/01/2013    Priority: Low   Grief 05/10/2018   Anemia 01/09/2018    Medications- reviewed and updated Current Outpatient Medications  Medication Sig Dispense Refill   amLODipine (NORVASC) 10 MG tablet TAKE 1 TABLET BY MOUTH EVERY DAY 90 tablet 1   aspirin 81 MG tablet Take 81 mg by mouth daily.       B-D INS SYRINGE 0.5CC/31GX5/16 31G X 5/16" 0.5 ML MISC USE AS DIRECTED 4 TIMES A DAY 300 each 1   busPIRone (BUSPAR) 10 MG tablet Take 1 tablet (10 mg total) by mouth 3 (three) times daily. 270 tablet 0   carvedilol (COREG) 25 MG tablet Take 1 tablet (25 mg total)  by mouth 2 (two) times daily with a meal. 180 tablet 2   cloNIDine (CATAPRES) 0.3 MG tablet TAKE 1 TABLET (0.3 MG TOTAL) BY MOUTH 2 (TWO) TIMES DAILY. 180 tablet 1   exemestane (AROMASIN) 25 MG tablet Take 1 tablet (25 mg total) by mouth daily after breakfast. 30 tablet 2   HYDROcodone-acetaminophen (NORCO/VICODIN) 5-325 MG tablet Take 1 tablet by mouth every 8 (eight) hours as needed for moderate pain. 15 tablet 0   Insulin Glargine (LANTUS SOLOSTAR) 100 UNIT/ML Solostar Pen Inject 28 units into the skin in am 10 pen 3    Insulin Pen Needle 31G X 5 MM MISC Use pen needles for insulin injection daily 100 each 3   insulin regular (NOVOLIN R) 100 units/mL injection Inject 0.14-0.18 mLs (14-18 Units total) into the skin 3 (three) times daily before meals. Relion. Please provide insulin syringes needed 20 mL 11   Insulin Syringe-Needle U-100 (B-D INS SYRINGE 0.5CC/30GX1/2") 30G X 1/2" 0.5 ML MISC USE AS DIRECTED 4 TIMES A DAY 300 each 3   letrozole (FEMARA) 2.5 MG tablet TAKE 1 TABLET BY MOUTH EVERY DAY 90 tablet 1   lisinopril-hydrochlorothiazide (ZESTORETIC) 10-12.5 MG tablet TAKE 1 TABLET BY MOUTH EVERY DAY 90 tablet 3   lovastatin (MEVACOR) 40 MG tablet Take 1 tablet (40 mg total) by mouth at bedtime. 90 tablet 3   metFORMIN (GLUCOPHAGE) 500 MG tablet TAKE 2 TABLETS BY MOUTH DAILY WITH SUPPER. 180 tablet 2   OVER THE COUNTER MEDICATION Take 1 tablet by mouth daily as needed. Stool softner for constipation, also drinks warm prune juice prn     polyethylene glycol powder (GLYCOLAX/MIRALAX) 17 GM/SCOOP powder Take 17 g by mouth daily. 1 g 0   traMADol (ULTRAM) 50 MG tablet TAKE 1 TABLET BY MOUTH EVERY 6 HOURS AS NEEDED FOR MODERATE/SEVERE PAIN (NECK AND SHOULDER PAIN). 20 tablet 0   traZODone (DESYREL) 50 MG tablet Take 0.5-1 tablets (25-50 mg total) by mouth at bedtime as needed for sleep. 30 tablet 3   furosemide (LASIX) 20 MG tablet Take 1 tablet (20 mg total) by mouth daily as needed. 30 tablet 0   No current facility-administered medications for this visit.      Objective:  Ht 5\' 2"  (1.575 m)    Wt 199 lb (90.3 kg)    LMP  (LMP Unknown)    BMI 36.40 kg/m  self reported vitals Gen: NAD, resting comfortably Lungs: nonlabored, normal respiratory rate  Skin: appears dry, no obvious rash No swelling in the right arm, swelling in left arm noted with some tightness in the left hand    Assessment and Plan   #Lymphedema #Edema #Shortness of breath S: pt states she has been retaining fluid in her left  arm since 06/05/2019 which was when she had an operation for left axillary lymph node dissection with her current breast cancer. She had her port removed yesterday. We reviewed that this is likely related to lymphedema.  She saw her surgeon earlier this week who noted the swelling in her left arm and recommended physical therapy for lymphedema-she thinks she can get into this within 1 to 2 weeks.She is not swelling in right hand or arm.  Swelling is significant enough in the left arm to make it more difficult to close her hand.  In addition to swelling in her left arm-She states she is having swelling in both ankles since the weekend. Admits to eating saltier richer foods over thanksgiving which could have caused trigger.  She also noted feeling more short of breath with walking up stairs yesterday. Weight Is up a few lbs on home scales.   Had echocardiogram 08/12/18 which showed 55% EF and only other issues was mild mitral regurgitation. No diastolic dysfunction.   Patient was not able to check her blood pressure today.  I encouraged her to do so when family arrives A/P: We reviewed the diagnosis of lymphedema and how this was associated with her breast cancer/lymph node dissection.  I strongly encouraged her to follow through with physical therapy.  From the visit earlier this week it does not sound like surgeon was concerned about DVT in upper extremity.  In regards to edema in her legs-I suspect patient had increased salt intake over the holiday weekend leading to weight gain, mild shortness of breath, bilateral leg swelling.  She had a reassuring echocardiogram about a year ago-I think it would be reasonable to try 5 days of Lasix.  I asked her to update me on Monday or Tuesday.  I gave her 30 tablets but was very clear to only take this for 5 days.  I think this will help her swelling and shortness of breath.  I also think sedentary activity likely contributes to shortness of breath.  Perhaps she will  get mild relief of swelling in the arm with Lasix  Offered doing labs this week but she feels like that would be difficult to arrange-she does think she could get in for labs next week if needed.  We discussed doing labs early next week if no significant improvement.  She does have CKD stage III based on last labs but I think she can tolerate 5 days of Lasix.  Also because of CKD stage III I did not start her on potassium at this time  Amlodipine could likely contribute to edema as well but without knowing current blood pressure I do not feel strongly about stopping this or reducing it.  We may need to consider it if this is an ongoing issue-hopefully with cutting back on salt we can reduce the problem.  She is also on carvedilol, clonidine, lisinopril hydrochlorothiazide for blood pressure.  Hopefully stable-update at home and let us know readings  Recommended follow up: I asked her to update me on Monday or Tuesday of next week Future Appointments  Date Time Provider [place]  09/03/2019 10:00 AM Marin Olp, MD [facility]  09/11/2019  1:45 PM [facility] 2 [facility] None  09/11/2019  2:20 PM [name], MD [facility] None  10/06/2019 11:00 AM [facility] 4 [facility] None  10/06/2019 11:20 AM [name], MD [facility] None    Lab/Order associations:   ICD-10-CM   1. Lymphedema of left arm  I89.0   2. Edema, unspecified type  R60.9   3. Shortness of breath  R06.02   4. Hypertension associated with diabetes ([place])  E11.59    I10     Meds ordered this encounter  Medications   furosemide (LASIX) 20 MG tablet    Sig: Take 1 tablet (20 mg total) by mouth daily as needed.    Dispense:  30 tablet    Refill:  0    Return precautions advised.  Garret Reddish, MD

## 2019-09-03 NOTE — Patient Instructions (Signed)
There are no preventive care reminders to display for this patient. Depression screen [facility] 2/9 07/24/2019 04/07/2019 12/05/2018  Decreased Interest 0 0 1  Down, Depressed, Hopeless 0 1 1  PHQ - 2 Score 0 1 2  Altered sleeping 3 3 1   Tired, decreased energy 1 2 2   Change in appetite 0 0 1  Feeling bad or failure about yourself  0 2 2  Trouble concentrating 0 0 0  Moving slowly or fidgety/restless 0 0 0  Suicidal thoughts 0 - 0  PHQ-9 Score 4 8 8   Difficult doing work/chores Not difficult at all Not difficult at all Not difficult at all  Some recent data might be hidden

## 2019-09-09 ENCOUNTER — Other Ambulatory Visit: Payer: Self-pay

## 2019-09-09 ENCOUNTER — Telehealth: Payer: Self-pay | Admitting: Family Medicine

## 2019-09-09 DIAGNOSIS — Z20822 Contact with and (suspected) exposure to covid-19: Secondary | ICD-10-CM

## 2019-09-09 DIAGNOSIS — Z20828 Contact with and (suspected) exposure to other viral communicable diseases: Secondary | ICD-10-CM

## 2019-09-09 NOTE — Telephone Encounter (Signed)
See below. Not sure what you wanted done with this.

## 2019-09-09 NOTE — Telephone Encounter (Signed)
That her shortness of breath improved on the Lasix?  I know they are still swollen but did the swelling in her ankles improve on the Lasix?  Given shortness of breath is new less to a COVID-19 test and then have her follow-up in the office after we get a negative result back.  Likely update blood work at that time.  She can take another Lasix if she feels like swelling worsens in the meantime-if she has significantly worsening symptoms should seek care in the emergency room.

## 2019-09-09 NOTE — Telephone Encounter (Signed)
FYI

## 2019-09-09 NOTE — Telephone Encounter (Signed)
She had shortness of breath prior to the [place] you in saying the shortness of breath worsened on Lasix?  That would be really atypical-I need to make sure before advising her to take more Lasix though.

## 2019-09-09 NOTE — Telephone Encounter (Signed)
Pt is going to get COVID tested Thursday. She states she had the SOB while on Lasix and the Lasix helped her swelling some.

## 2019-09-09 NOTE — Telephone Encounter (Signed)
Add this to her last visit- she didn't have a BP  Have patient follow check once a day if possible or at least every other day on BP for next week then send results to me. We may need to increase blood pressure medicines if remains over 140 on average

## 2019-09-09 NOTE — Telephone Encounter (Signed)
Patient states she was advised by Dr. Yong Channel to leave her BP and temp for today.    BP- 150/96  Temp: 97.3

## 2019-09-09 NOTE — Telephone Encounter (Signed)
Called and notified pt of bleow. Pt states she has stopped taking her lasix as of yesterday and she is still experiencing SOB with movement and walking. She states her feet and ankles are still swollen and she does not know where this SOB is coming from or what you want to do about that.

## 2019-09-10 ENCOUNTER — Ambulatory Visit: Payer: Medicare Other | Admitting: Physical Therapy

## 2019-09-10 NOTE — Telephone Encounter (Signed)
No it did not worsen on lasix, it remained the same. She said she still experienced SOB while on lasix, did not get worse or better.

## 2019-09-10 NOTE — Telephone Encounter (Signed)
Thanks for all the updates Keba-likely best to see her in person once we get suspected negative COVID-19 test back.  If she has worsening symptoms can seek care in the emergency room

## 2019-09-10 NOTE — Progress Notes (Signed)
[place]   [phone] [phone]   Clinic Follow up Note   Patient Care Team: Marin Olp, MD as PCP - General (Family Medicine) Philemon Kingdom, MD as Consulting Physician (Internal Medicine) Jalene Mullet, MD as Consulting Physician (Ophthalmology) Erroll Luna, MD as Consulting Physician (General Surgery) Kyung Rudd, MD as Consulting Physician (Radiation Oncology) [name], MD as Consulting Physician (Hematology) Gardenia Phlegm, NP as Nurse Practitioner (Hematology and Oncology)  Date of Service:  09/11/2019  CHIEF COMPLAINT: Follow Up for Invasive Ductal Carcinoma of the left breast  SUMMARY OF ONCOLOGIC HISTORY: Oncology History Overview Note  Cancer Staging Malignant neoplasm of upper-outer quadrant of left breast in female, estrogen receptor positive ([place]) Staging form: Breast, AJCC 8th Edition - Clinical: Stage IIB (cT2, cN0, cM0, G3, ER: Positive, PR: Negative, HER2: Negative) - Signed by [name], MD on 11/23/2016 - Pathologic stage from 12/28/2016: Stage IIB (pT2, pN1a(sn), cM0, G3, ER: Positive, PR: Negative, HER2: Negative) - Signed by [name], MD on 01/02/2017     Malignant neoplasm of upper-outer quadrant of left breast in female, estrogen receptor positive ([place])  11/01/2016 Mammogram   Diagnostic mammogram and ultrasound of left breast and axilla showed a 3.1 x 2.1 x 1.4 cm (3.3 x 2.0 x 2.7 cm by ultrasound) mass in the upper outer quadrant of the anterior third of the left breast, associated with pleomorphic calcification. There is a 8 mm (1.8cm by [name]) prominent lymph node in the left axilla.   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   11/07/2016 Receptors her2   ER 80% positive, PR negative, HER-2 negative, Ki-67 90%   11/20/2016 Initial Diagnosis   Malignant neoplasm of upper-outer quadrant of left  breast in female, estrogen receptor positive ([place])   12/07/2016 Surgery   Left lumpectomy and left axillary sentinel lymph node sampling by Dr. Brantley Stage   12/07/2016 Pathology Results   pT2, pN1 Left Lumpectomy: Grade 3 IDC measuring 3.4 cm, carcinoma broadly present at the superior margin. Grade 3 DCIS. 1 out of 2 left axillary SLN positive for metastatic carcinoma   12/07/2016 Miscellaneous   Mammaprint showed high risk disease, basal type    12/28/2016 Surgery   Re-excision of the previously positive superior margin was negative for malignant cells.    01/04/2017 Surgery   Port inserted   01/18/2017 - 03/22/2017 Chemotherapy   Adjuvant Docetaxel 75 mg/m and Cytoxan 600 mg/m, every 21 days, for total of 4 cycles, with Neulasta on day 2.    04/23/2017 - 05/18/2017 Radiation Therapy   Radiation treatment dates:   04/23/17 - 05/18/17 Administered by Dr. Lisbeth Renshaw  Site/dose:    Left breast/ 42.5 Gy in 17 Fx Boost / 7.5 Gy in 3 Fx   06/12/2017 - 08/14/2019 Anti-estrogen oral therapy   Adjuvant letrozole 1 mg daily, plan for 5-7 years. D/c on 08/14/19 due to local recurrence of left axilla.     07/20/2017 Mammogram   IMPRESSION: No mammographic evidence of malignancy in either breast. 3.8 cm left breast postsurgical loculated seroma.   09/12/2017 Survivorship     09/19/2018 Imaging   Baseline DEXA 09/19/18 ASSESSMENT: The BMD measured at Femur Neck Left is 0.888 g/cm2 with a T-score of -1.1. This patient is considered osteopenic according to [facility]) criteria.   The scan quality is good. L-4 was excluded due to degenerative changes.  Site Region Measured Date Measured Age YA BMD Significant CHANGE T-score AP Spine  L1-L3      09[age]19/2018    [age]         -0.7    1.084 g/cm2   DualFemur Neck Left  09/19/2018    [age]         -1.1    0.888 g/cm2   DualFemur Total Mean 09/19/2018    77.2         -0.5    0.946 g/cm2 ASSESSMENT: The probability of a major  osteoporotic fracture is 4.5 % within the next ten years.   The probability of hip fracture is 0.8  % within the next 10 years.   06/25/2019 Mammogram   Diagnostic mammogram 06/25/19  IMPRESSION: 1.  Two morphologically abnormal lymph nodes in the left axilla with a lymph node in the lower left axilla measuring 2.4 x 2 x 2.6 cm.   2. Stable lumpectomy changes left breast with no findings of malignancy in either breast.   07/01/2019 Pathology Results   Diagnosis Lymph node, needle/core biopsy, left axilla - POORLY DIFFERENTIATED CARCINOMA CONSISTENT WITH BREAST PRIMARY. - NO LYMPHOID NODAL TISSUE IDENTIFIED. - SEE MICROSCOPIC DESCRIPTION. Microscopic Comment The carcinoma is consistent with grade III.  PROGNOSTIC INDICATORS Results: IMMUNOHISTOCHEMICAL AND MORPHOMETRIC ANALYSIS PERFORMED MANUALLY The tumor cells are NEGATIVE for Her2 (1+). Estrogen Receptor: 80%, POSITIVE, WEAK STAINING INTENSITY Progesterone Receptor: 0%, NEGATIVE Proliferation Marker Ki67: 80%   07/18/2019 Breast MRI   Breast MRI 07/18/19  IMPRESSION: 1. Two morphologically abnormal level 1 left axillary lymph nodes correlating with biopsy-proven malignancy. 2. No MRI evidence of malignancy in either breast. 3. Postsurgical changes of the left breast consistent with prior lumpectomy.   07/25/2019 PET scan   PET 07/25/19  IMPRESSION: 1. Hypermetabolic LEFT axillary mass consistent with metastatic breast cancer. 2. High LEFT axillary/posterior triangle (level 5) lymph node consistent with local metastasis. 3. No central mediastinal nodal metastasis. No suspicious pulmonary nodules. 4. No distant soft tissue metastasis or skeletal metastasis. 5. Calcified fibroid uterus   08/05/2019 Surgery   LEFT AXILLARY LYMPH NODE DISSECTION by Dr. Brantley Stage 08/05/19    08/05/2019 Pathology Results   FINAL MICROSCOPIC DIAGNOSIS: 08/05/19    A. LYMPH NODES, LEFT AXILLARY, DISSECTION:  - Metastatic carcinoma.  - See  comment.    08/2019 -  Anti-estrogen oral therapy   Exemestane 76m starting 08/2019   09/01/2019 Genetic Testing   [facility] Genomic Findings:  -RET amplification -CDK8 amplification -NOTCH1 deletion  -TIF02splice site [id number]     CURRENT THERAPY:  Exemestane 226mstarting 08/2019  INTERVAL HISTORY:  Wendy BASQUEs here for a follow up of left breast cancer. She presents to the clinic with family. She notes she is doing well but has SOB when she walks. She also had left hand swelling. She feels she has fluid build up. She notes she had negative COVID test before. She notes her PT will be over video conference. She notes she pays $165 for her exemestane. She is tolerating exemestane.    REVIEW OF SYSTEMS:   Constitutional: Denies fevers, chills or abnormal weight loss Eyes: Denies blurriness of vision Ears, nose, mouth, throat, and face: Denies mucositis or sore throat Respiratory: Denies cough or wheezes (+) SOB when walking  Cardiovascular: Denies palpitation, chest discomfort or lower extremity swelling (+) Left hand swelling  Gastrointestinal:  Denies nausea, heartburn or change in bowel habits Skin: Denies abnormal skin rashes Lymphatics: Denies new lymphadenopathy  or easy bruising Neurological:Denies numbness, tingling or new weaknesses Behavioral/Psych: Mood is stable, no new changes  All other systems were reviewed with the patient and are negative.  MEDICAL HISTORY:  Past Medical History:  Diagnosis Date  . Abnormal breast finding 2018   per pt/ having  a lot drainage from left breast nipple  . Anemia   . Anxiety   . Breast cancer ([place]) 11/2016   left/  . CKD (chronic kidney disease)   . Depression   . GERD (gastroesophageal reflux disease)    TUMS as needed  . HTN (hypertension)    states BP has been high recently; has been on med. x 20 yr.  . Hyperlipidemia   . Hyperplastic colon polyp   . HYPERTENSION, BENIGN SYSTEMIC 11/29/2006   Lisinopril hctz  10-12.'5mg'$ , amlodipine '10mg'$ , coreg '25mg'$  BID, clonidine 0.3 mg BID.   Marland Kitchen Insulin dependent diabetes mellitus   . Personal history of chemotherapy    2018/finished 6 weeks of chemo in Sep 2018  . Personal history of radiation therapy    2018 left breast/finished radiation in Sept 2018 per pt.    SURGICAL HISTORY: Past Surgical History:  Procedure Laterality Date  . AXILLARY LYMPH NODE DISSECTION Left 08/05/2019   Procedure: LEFT AXILLARY LYMPH NODE DISSECTION;  Surgeon: Erroll Luna, MD;  Location: [place];  Service: General;  Laterality: Left;  . BREAST LUMPECTOMY Left 05/05/2008  . BREAST LUMPECTOMY Left 12/07/2016   malignant  . BREAST LUMPECTOMY WITH RADIOACTIVE SEED AND SENTINEL LYMPH NODE BIOPSY Left 12/07/2016   Procedure: LEFT BREAST LUMPECTOMY WITH RADIOACTIVE SEED AND SENTINEL LYMPH NODE BIOPSY;  Surgeon: Erroll Luna, MD;  Location: [place];  Service: General;  Laterality: Left;  . IR FLUORO GUIDE PORT INSERTION RIGHT  01/04/2017  . IR US GUIDE VASC ACCESS RIGHT  01/04/2017  . PORT-A-CATH REMOVAL N/A 05/30/2017   Procedure: REMOVAL PORT-A-CATH;  Surgeon: Erroll Luna, MD;  Location: [place];  Service: General;  Laterality: N/A;  . RE-EXCISION OF BREAST LUMPECTOMY Left 12/28/2016   Procedure: RE-EXCISION OF BREAST LUMPECTOMY;  Surgeon: Erroll Luna, MD;  Location: [place];  Service: General;  Laterality: Left;    I have reviewed the social history and family history with the patient and they are unchanged from previous note.  ALLERGIES:  has No Known Allergies.  MEDICATIONS:  Current Outpatient Medications  Medication Sig Dispense Refill  . amLODipine (NORVASC) 10 MG tablet TAKE 1 TABLET BY MOUTH EVERY DAY 90 tablet 1  . aspirin 81 MG tablet Take 81 mg by mouth daily.      . B-D INS SYRINGE 0.5CC/31GX5/16 31G X 5/16" 0.5 ML MISC USE AS DIRECTED 4 TIMES A DAY 300 each 1  . busPIRone (BUSPAR) 10 MG tablet Take 1 tablet (10  mg total) by mouth 3 (three) times daily. 270 tablet 0  . carvedilol (COREG) 25 MG tablet Take 1 tablet (25 mg total) by mouth 2 (two) times daily with a meal. 180 tablet 2  . cloNIDine (CATAPRES) 0.3 MG tablet TAKE 1 TABLET (0.3 MG TOTAL) BY MOUTH 2 (TWO) TIMES DAILY. 180 tablet 1  . exemestane (AROMASIN) 25 MG tablet Take 1 tablet (25 mg total) by mouth daily after breakfast. 30 tablet 2  . furosemide (LASIX) 20 MG tablet Take 1 tablet (20 mg total) by mouth daily as needed. 30 tablet 0  . HYDROcodone-acetaminophen (NORCO/VICODIN) 5-325 MG tablet Take 1 tablet by mouth every 8 (eight) hours as needed  for moderate pain. 15 tablet 0  . Insulin Glargine (LANTUS SOLOSTAR) 100 UNIT/ML Solostar Pen Inject 28 units into the skin in am 10 pen 3  . Insulin Pen Needle 31G X 5 MM MISC Use pen needles for insulin injection daily 100 each 3  . insulin regular (NOVOLIN R) 100 units/mL injection Inject 0.14-0.18 mLs (14-18 Units total) into the skin 3 (three) times daily before meals. [place]. Please provide insulin syringes needed 20 mL 11  . Insulin Syringe-Needle U-100 (B-D INS SYRINGE 0.5CC/30GX1/2") 30G X 1/2" 0.5 ML MISC USE AS DIRECTED 4 TIMES A DAY 300 each 3  . letrozole (FEMARA) 2.5 MG tablet TAKE 1 TABLET BY MOUTH EVERY DAY 90 tablet 1  . lisinopril-hydrochlorothiazide (ZESTORETIC) 10-12.5 MG tablet TAKE 1 TABLET BY MOUTH EVERY DAY 90 tablet 3  . lovastatin (MEVACOR) 40 MG tablet Take 1 tablet (40 mg total) by mouth at bedtime. 90 tablet 3  . metFORMIN (GLUCOPHAGE) 500 MG tablet TAKE 2 TABLETS BY MOUTH DAILY WITH SUPPER. 180 tablet 2  . OVER THE COUNTER MEDICATION Take 1 tablet by mouth daily as needed. Stool softner for constipation, also drinks warm prune juice prn    . polyethylene glycol powder (GLYCOLAX/MIRALAX) 17 GM/SCOOP powder Take 17 g by mouth daily. 1 g 0  . traMADol (ULTRAM) 50 MG tablet TAKE 1 TABLET BY MOUTH EVERY 6 HOURS AS NEEDED FOR MODERATE/SEVERE PAIN (NECK AND SHOULDER PAIN). 20  tablet 0  . traZODone (DESYREL) 50 MG tablet Take 0.5-1 tablets (25-50 mg total) by mouth at bedtime as needed for sleep. 30 tablet 3   No current facility-administered medications for this visit.    PHYSICAL EXAMINATION: ECOG PERFORMANCE STATUS: 1 - Symptomatic but completely ambulatory  Vitals:   09/11/19 1324  BP: (!) 154/65  Pulse: 74  Resp: 20  Temp: 98.1 F (36.7 C)  SpO2: 100%   Filed Weights   09/11/19 1324  Weight: 198 lb 14.4 oz (90.2 kg)    GENERAL:alert, no distress and comfortable SKIN: skin color, texture, turgor are normal, no rashes or significant lesions EYES: normal, Conjunctiva are pink and non-injected, sclera clear  NECK: supple, thyroid normal size, non-tender, without nodularity LYMPH:  no palpable lymphadenopathy in the cervical, axillary  LUNGS: clear to auscultation and percussion with normal breathing effort HEART: regular rate & rhythm and no murmurs (+) lower extremity edema ABDOMEN:abdomen soft, non-tender and normal bowel sounds Musculoskeletal:no cyanosis of digits and no clubbing  NEURO: alert & oriented x 3 with fluent speech, no focal motor/sensory deficits BREAST: S/p left lumpectomy and LN Dissection: Surgical incision healed well with scar tissue. (+) Left arm and hand lymphedema. No palpable mass, nodules bilaterally. Breast exam benign.   LABORATORY DATA:  I have reviewed the data as listed CBC Latest Ref Rng & Units 09/11/2019 07/10/2019 04/02/2019  WBC 4.0 - 10.5 K/uL 7.8 6.8 -  Hemoglobin 12.0 - 15.0 g/dL 11.0(L) 10.6(L) -  Hematocrit 36.0 - 46.0 % 31.8(L) 31.3(L) 33.6(L)  Platelets 150 - 400 K/uL 298 243 -     CMP Latest Ref Rng & Units 09/11/2019 07/10/2019 04/01/2019  Glucose 70 - 99 mg/dL 130(H) 233(H) 312(H)  BUN 8 - 23 mg/dL 29(H) 20 21  Creatinine 0.44 - 1.00 mg/dL 1.84(H) 1.38(H) 1.30(H)  Sodium 135 - 145 mmol/L 141 141 136  Potassium 3.5 - 5.1 mmol/L 4.3 4.2 4.2  Chloride 98 - 111 mmol/L 102 103 98  CO2 22 - 32 mmol/L  '29 29 29  '$ Calcium 8.9 -  10.3 mg/dL 8.9 9.1 9.3  Total Protein 6.5 - 8.1 g/dL 7.3 6.9 6.7  Total Bilirubin 0.3 - 1.2 mg/dL 0.2(L) 0.2(L) 0.3  Alkaline Phos 38 - 126 U/L 106 92 95  AST 15 - 41 U/L 11(L) 9(L) 7  ALT 0 - 44 U/L <6 <6 5      RADIOGRAPHIC STUDIES: I have personally reviewed the radiological images as listed and agreed with the findings in the report. No results found.   ASSESSMENT & PLAN:  Wendy [name] is a 78 y.o. female with   1. Malignant neoplasm of upper-outer quadrant of left breast, Invasive Ductal Carcinoma, pT2pN1M0, stage IIB, ER+/PR-/HER2-, G3, mammaprint high risk, axillary nodes recurrence in 06/2019 -She was initially diagnosed in 11/2016. She was treated with left lumpectomy and SLN sampling, re-excision surgery, Adjuvant TC for 4 cycles and adjuvant radiation.  -She started antiestrogen with Letrozole in 06/2017.  -Unfortunately she had recent local recurrence in left axilla as seen on 06/2019 Mammogram and confirmed with 06/2019 biopsy. Grade III, ER+/PR-/HER2-. Her Breast MRI and PET scan were negative for breast or distant metastasis.  -She underwent Left axillary lymph node dissection by Dr. Brantley Stage on 08/05/19. She had complete resection. Given the extent of LNs involvement her risk for breast cancer recurrence is high.  -Given her prior chemo treatment, and her advanced age, I did not recommend more chemo after ALND -I started her on Exemestane in 08/2019 to reduce her risk of recurrence. She is tolerating well. Plan for a total of 10 years  -We discussed her FO results shows RET, CDK8, NOTCH1, TP53 mutations. No ER mutation.  -She is clinically doing well but has been having SOB with walking and LE edema. Her Left arm edema is stable. She will proceed with PT soon. Her last ECHO in 08/2018 normal. Physical exam benign. Labs reviewed with mild anemia.  -Continue Exemestane and surveillance.    2. Left arm numbness/tingling and Left arm lymphedema, secondary to  surgery.   -secondary to malignant left axillary adenopathy, s/u ALND -She still has left arm and hand lymphedema. She plans to be seen by PT over video conference soon. I discussed her edema can improve but may have residual edema permanently. I encourage her to participate PT   3. HTN, DM, CKD III -stable -F/u with PCP  4. Mild anemia -Iron and TIBC, B12, MMA normal -mild and stable.   5. Osteopenia  -09/2018 DEXA shows osteopenia with lowest T-score -1.1 -Will monitor on AI. Next DEXA in 09/2020   6. Financial Support  -She paid $165 out of pocket for her exemestane.  -I encouraged her to contact financial advocate Shauna about applying for grant to cover or help cost of medication. She is interested.    Plan:  -I discussed FO result and gave her a copy  -Continue Exemestane  -Lab and F/u in 6 months, she will see Dr. Alena Bills in 3-4 months    No problem-specific Assessment & Plan notes found for this encounter.   No orders of the defined types were placed in this encounter.  All questions were answered. The patient knows to call the clinic with any problems, questions or concerns. No barriers to learning was detected. I spent 20 minutes counseling the patient face to face. The total time spent in the appointment was 25 minutes and more than 50% was on counseling and review of test results     [name], MD 09/11/2019   I, Joslyn Devon, am acting  as scribe for [name], MD.   I have reviewed the above documentation for accuracy and completeness, and I agree with the above.

## 2019-09-10 NOTE — Telephone Encounter (Signed)
Called patient she will let us know when neg results so that we can make appointment. Reviewed red words if any change in symptoms she will go to ED

## 2019-09-11 ENCOUNTER — Inpatient Hospital Stay (HOSPITAL_BASED_OUTPATIENT_CLINIC_OR_DEPARTMENT_OTHER): Payer: Medicare Other | Admitting: Hematology

## 2019-09-11 ENCOUNTER — Encounter: Payer: Self-pay | Admitting: Hematology

## 2019-09-11 ENCOUNTER — Inpatient Hospital Stay: Payer: Medicare Other | Attending: Hematology

## 2019-09-11 ENCOUNTER — Other Ambulatory Visit: Payer: Self-pay

## 2019-09-11 VITALS — BP 154/65 | HR 74 | Temp 98.1°F | Resp 20 | Ht 62.0 in | Wt 198.9 lb

## 2019-09-11 DIAGNOSIS — M8580 Other specified disorders of bone density and structure, unspecified site: Secondary | ICD-10-CM | POA: Diagnosis not present

## 2019-09-11 DIAGNOSIS — R0602 Shortness of breath: Secondary | ICD-10-CM | POA: Diagnosis not present

## 2019-09-11 DIAGNOSIS — E1122 Type 2 diabetes mellitus with diabetic chronic kidney disease: Secondary | ICD-10-CM | POA: Diagnosis not present

## 2019-09-11 DIAGNOSIS — Z9221 Personal history of antineoplastic chemotherapy: Secondary | ICD-10-CM | POA: Insufficient documentation

## 2019-09-11 DIAGNOSIS — Z794 Long term (current) use of insulin: Secondary | ICD-10-CM | POA: Diagnosis not present

## 2019-09-11 DIAGNOSIS — Z79811 Long term (current) use of aromatase inhibitors: Secondary | ICD-10-CM | POA: Insufficient documentation

## 2019-09-11 DIAGNOSIS — R222 Localized swelling, mass and lump, trunk: Secondary | ICD-10-CM | POA: Diagnosis not present

## 2019-09-11 DIAGNOSIS — Z170 Estrogen receptor positive status [ER+]: Secondary | ICD-10-CM | POA: Diagnosis not present

## 2019-09-11 DIAGNOSIS — D649 Anemia, unspecified: Secondary | ICD-10-CM | POA: Insufficient documentation

## 2019-09-11 DIAGNOSIS — C50412 Malignant neoplasm of upper-outer quadrant of left female breast: Secondary | ICD-10-CM | POA: Insufficient documentation

## 2019-09-11 DIAGNOSIS — I129 Hypertensive chronic kidney disease with stage 1 through stage 4 chronic kidney disease, or unspecified chronic kidney disease: Secondary | ICD-10-CM | POA: Insufficient documentation

## 2019-09-11 DIAGNOSIS — C773 Secondary and unspecified malignant neoplasm of axilla and upper limb lymph nodes: Secondary | ICD-10-CM | POA: Insufficient documentation

## 2019-09-11 DIAGNOSIS — N183 Chronic kidney disease, stage 3 unspecified: Secondary | ICD-10-CM | POA: Insufficient documentation

## 2019-09-11 DIAGNOSIS — Z923 Personal history of irradiation: Secondary | ICD-10-CM | POA: Insufficient documentation

## 2019-09-11 LAB — CBC WITH DIFFERENTIAL/PLATELET
Abs Immature Granulocytes: 0.04 10*3/uL (ref 0.00–0.07)
Basophils Absolute: 0 10*3/uL (ref 0.0–0.1)
Basophils Relative: 0 %
Eosinophils Absolute: 0.8 10*3/uL — ABNORMAL HIGH (ref 0.0–0.5)
Eosinophils Relative: 10 %
HCT: 31.8 % — ABNORMAL LOW (ref 36.0–46.0)
Hemoglobin: 11 g/dL — ABNORMAL LOW (ref 12.0–15.0)
Immature Granulocytes: 1 %
Lymphocytes Relative: 22 %
Lymphs Abs: 1.7 10*3/uL (ref 0.7–4.0)
MCH: 28.8 pg (ref 26.0–34.0)
MCHC: 34.6 g/dL (ref 30.0–36.0)
MCV: 83.2 fL (ref 80.0–100.0)
Monocytes Absolute: 0.6 10*3/uL (ref 0.1–1.0)
Monocytes Relative: 8 %
Neutro Abs: 4.6 10*3/uL (ref 1.7–7.7)
Neutrophils Relative %: 59 %
Platelets: 298 10*3/uL (ref 150–400)
RBC: 3.82 MIL/uL — ABNORMAL LOW (ref 3.87–5.11)
RDW: 15.6 % — ABNORMAL HIGH (ref 11.5–15.5)
WBC: 7.8 10*3/uL (ref 4.0–10.5)
nRBC: 0 % (ref 0.0–0.2)

## 2019-09-11 LAB — COMPREHENSIVE METABOLIC PANEL
ALT: <6 U/L (ref 0–44)
AST: 11 U/L — ABNORMAL LOW (ref 15–41)
Albumin: 3.7 g/dL (ref 3.5–5.0)
Alkaline Phosphatase: 106 U/L (ref 38–126)
Anion gap: 10 (ref 5–15)
BUN: 29 mg/dL — ABNORMAL HIGH (ref 8–23)
CO2: 29 mmol/L (ref 22–32)
Calcium: 8.9 mg/dL (ref 8.9–10.3)
Chloride: 102 mmol/L (ref 98–111)
Creatinine, Ser: 1.84 mg/dL — ABNORMAL HIGH (ref 0.44–1.00)
GFR calc Af Amer: 30 mL/min — ABNORMAL LOW (ref >60)
GFR calc non Af Amer: 26 mL/min — ABNORMAL LOW (ref >60)
Glucose, Bld: 130 mg/dL — ABNORMAL HIGH (ref 70–99)
Potassium: 4.3 mmol/L (ref 3.5–5.1)
Sodium: 141 mmol/L (ref 135–145)
Total Bilirubin: 0.2 mg/dL — ABNORMAL LOW (ref 0.3–1.2)
Total Protein: 7.3 g/dL (ref 6.5–8.1)

## 2019-09-11 MED FILL — EXEMESTANE 25 MG TABLET: 25 | 30 days supply | Qty: 30 | Fill #0

## 2019-09-11 NOTE — Progress Notes (Signed)
Met with patient regarding assistance with medication cost for Exemestane. She states she has some but it is expensive to fill monthly.  Advised patient what is needed to apply for grant. Patient will bring proof of income on Tues 12/15. She will call when she is able to come.  She has my card for any additional financial questions or concerns.

## 2019-09-12 ENCOUNTER — Telehealth: Payer: Self-pay | Admitting: Hematology

## 2019-09-12 ENCOUNTER — Other Ambulatory Visit: Payer: Self-pay

## 2019-09-12 ENCOUNTER — Telehealth: Payer: Self-pay

## 2019-09-12 NOTE — Telephone Encounter (Signed)
Spoke with patient regarding lab results, per Dr. Burr Medico informed her Creatinine is worse than before probably related to Lasix.  Instructed her to follow up with Dr. Yong Channel her PCP and I have faxed lab results to his office.

## 2019-09-12 NOTE — Telephone Encounter (Signed)
-----   Message from [name], MD sent at 09/12/2019  9:24 AM EST ----- Please let pt know her lab results, Cr worse than before, probably related to lasix she recently started. Please let her f/u with PCP and fax the lab results to her PCP Dr. Yong Channel.   Thanks  [name]  09/12/2019

## 2019-09-12 NOTE — Telephone Encounter (Signed)
Scheduled appt per 12/10 los.  A calendar will be mailed out. 

## 2019-09-15 ENCOUNTER — Encounter: Payer: Self-pay | Admitting: Family Medicine

## 2019-09-15 ENCOUNTER — Ambulatory Visit (INDEPENDENT_AMBULATORY_CARE_PROVIDER_SITE_OTHER): Payer: Medicare Other

## 2019-09-15 ENCOUNTER — Ambulatory Visit (INDEPENDENT_AMBULATORY_CARE_PROVIDER_SITE_OTHER): Payer: Medicare Other | Admitting: Family Medicine

## 2019-09-15 ENCOUNTER — Other Ambulatory Visit: Payer: Self-pay

## 2019-09-15 VITALS — BP 164/70 | HR 75 | Temp 99.0°F | Ht 62.0 in | Wt 199.8 lb

## 2019-09-15 DIAGNOSIS — I5189 Other ill-defined heart diseases: Secondary | ICD-10-CM | POA: Diagnosis not present

## 2019-09-15 DIAGNOSIS — E1159 Type 2 diabetes mellitus with other circulatory complications: Secondary | ICD-10-CM | POA: Diagnosis not present

## 2019-09-15 DIAGNOSIS — R319 Hematuria, unspecified: Secondary | ICD-10-CM | POA: Diagnosis not present

## 2019-09-15 DIAGNOSIS — M7989 Other specified soft tissue disorders: Secondary | ICD-10-CM | POA: Diagnosis not present

## 2019-09-15 DIAGNOSIS — N183 Chronic kidney disease, stage 3 unspecified: Secondary | ICD-10-CM | POA: Diagnosis not present

## 2019-09-15 DIAGNOSIS — I10 Essential (primary) hypertension: Secondary | ICD-10-CM | POA: Diagnosis not present

## 2019-09-15 DIAGNOSIS — R609 Edema, unspecified: Secondary | ICD-10-CM | POA: Diagnosis not present

## 2019-09-15 DIAGNOSIS — I152 Hypertension secondary to endocrine disorders: Secondary | ICD-10-CM

## 2019-09-15 DIAGNOSIS — R0602 Shortness of breath: Secondary | ICD-10-CM

## 2019-09-15 LAB — POC URINALSYSI DIPSTICK (AUTOMATED)
Bilirubin, UA: NEGATIVE
Blood, UA: POSITIVE
Glucose, UA: POSITIVE — AB
Ketones, UA: NEGATIVE
Leukocytes, UA: NEGATIVE
Nitrite, UA: NEGATIVE
Protein, UA: POSITIVE — AB
Spec Grav, UA: 1.015 (ref 1.010–1.025)
Urobilinogen, UA: 0.2 E.U./dL
pH, UA: 6 (ref 5.0–8.0)

## 2019-09-15 NOTE — Progress Notes (Signed)
Phone [phone] In person visit   Subjective:   Wendy [name] is a 78 y.o. year old very pleasant female patient who presents for/with See problem oriented charting Chief Complaint  Patient presents with  . Discuss abnormal kidney function    ROS- Review of Systems  Constitutional: Negative.   HENT: Negative.   Eyes: Negative.   Respiratory: Positive for shortness of breath (when walking a lot).   Cardiovascular: Negative.   Gastrointestinal: Negative.   Genitourinary: Negative.   Musculoskeletal: Positive for neck pain (neck and left shoulder pain).  Skin: Negative.   Neurological: Negative.   Endo/Heme/Allergies: Negative.   Psychiatric/Behavioral: Negative.       This visit occurred during the SARS-CoV-2 public health emergency.  Safety protocols were in place, including screening questions prior to the visit, additional usage of staff PPE, and extensive cleaning of exam room while observing appropriate contact time as indicated for disinfecting solutions.   Past Medical History-  Patient Active Problem List   Diagnosis Date Noted  . Shortness of breath 03/14/2017    Priority: High  . Malignant neoplasm of upper-outer quadrant of left breast in female, estrogen receptor positive ([place]) 11/20/2016    Priority: High  . 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 Diastolic dysfunction 85/11/7739    Priority: High  . Lymphedema of left arm 09/03/2019    Priority: Medium  . 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[place]) 12/05/2018    Priority: Medium  . Rectal bleeding 01/17/2017    Priority: Medium  . CKD (chronic kidney disease) stage 3, GFR 30-59 ml/min ([facility]) 03/17/2016    Priority: Medium  . Osteopenia 01/05/2016    Priority: Medium  . TOBACCO USE 04/13/2008    Priority: Medium  . Hyperlipidemia associated with type 2 diabetes mellitus  ([place]) 11/29/2006    Priority: Medium  . OBESITY, BMI 30-35 11/29/2006    Priority: Medium  . Hypertension associated with diabetes ([place]) 11/29/2006    Priority: Medium  . Fatigue 03/14/2017    Priority: Low  . Edema 03/14/2017    Priority: Low  . Port catheter in place 01/18/2017    Priority: Low  . Constipation 01/17/2017    Priority: Low  . Seasonal allergies 12/01/2013    Priority: Low  . Grief 05/10/2018  . Anemia 01/09/2018    Medications- reviewed and updated Current Outpatient Medications  Medication Sig Dispense Refill  . amLODipine (NORVASC) 10 MG tablet TAKE 1 TABLET BY MOUTH EVERY DAY 90 tablet 1  . aspirin 81 MG tablet Take 81 mg by mouth daily.      . B-D INS SYRINGE 0.5CC/31GX5/16 31G X 5/16" 0.5 ML MISC USE AS DIRECTED 4 TIMES A DAY 300 each 1  . busPIRone (BUSPAR) 10 MG tablet Take 1 tablet (10 mg total) by mouth 3 (three) times daily. 270 tablet 0  . carvedilol (COREG) 25 MG tablet Take 1 tablet (25 mg total) by mouth 2 (two) times daily with a meal. 180 tablet 2  . cloNIDine (CATAPRES) 0.3 MG tablet TAKE 1 TABLET (0.3 MG TOTAL) BY MOUTH 2 (TWO) TIMES DAILY. 180 tablet 1  . exemestane (AROMASIN) 25 MG tablet Take 1 tablet (25 mg total) by mouth daily after breakfast. 30 tablet 2  . HYDROcodone-acetaminophen (NORCO/VICODIN) 5-325 MG tablet Take 1 tablet by mouth every 8 (eight) hours as needed for moderate pain. 15 tablet 0  .  Insulin Glargine (LANTUS SOLOSTAR) 100 UNIT/ML Solostar Pen Inject 28 units into the skin in am 10 pen 3  . Insulin Pen Needle 31G X 5 MM MISC Use pen needles for insulin injection daily 100 each 3  . insulin regular (NOVOLIN R) 100 units/mL injection Inject 0.14-0.18 mLs (14-18 Units total) into the skin 3 (three) times daily before meals. [place]. Please provide insulin syringes needed 20 mL 11  . Insulin Syringe-Needle U-100 (B-D INS SYRINGE 0.5CC/30GX1/2") 30G X 1/2" 0.5 ML MISC USE AS DIRECTED 4 TIMES A DAY 300 each 3  . letrozole (FEMARA)  2.5 MG tablet TAKE 1 TABLET BY MOUTH EVERY DAY 90 tablet 1  . lisinopril-hydrochlorothiazide (ZESTORETIC) 10-12.5 MG tablet TAKE 1 TABLET BY MOUTH EVERY DAY 90 tablet 3  . lovastatin (MEVACOR) 40 MG tablet Take 1 tablet (40 mg total) by mouth at bedtime. 90 tablet 3  . metFORMIN (GLUCOPHAGE) 500 MG tablet TAKE 2 TABLETS BY MOUTH DAILY WITH SUPPER. 180 tablet 2  . OVER THE COUNTER MEDICATION Take 1 tablet by mouth daily as needed. Stool softner for constipation, also drinks warm prune juice prn    . polyethylene glycol powder (GLYCOLAX/MIRALAX) 17 GM/SCOOP powder Take 17 g by mouth daily. 1 g 0  . traMADol (ULTRAM) 50 MG tablet TAKE 1 TABLET BY MOUTH EVERY 6 HOURS AS NEEDED FOR MODERATE/SEVERE PAIN (NECK AND SHOULDER PAIN). 20 tablet 0  . traZODone (DESYREL) 50 MG tablet Take 0.5-1 tablets (25-50 mg total) by mouth at bedtime as needed for sleep. 30 tablet 3  . furosemide (LASIX) 20 MG tablet Take 1 tablet (20 mg total) by mouth daily as needed. (Patient not taking: Reported on 09/15/2019) 30 tablet 0   No current facility-administered medications for this visit.     Objective:  BP (!) 164/70   Pulse 75   Temp 99 F (37.2 C) (Temporal)   Ht 5\' 2"  (1.575 m)   Wt 199 lb 12.8 oz (90.6 kg)   LMP  (LMP Unknown)   SpO2 99%   BMI 36.54 kg/m  Gen: NAD, resting comfortably CV: RRR no murmurs rubs or gallops Lungs: CTAB no crackles, wheeze, rhonchi Abdomen: soft/nontender/nondistended/normal bowel sounds.  Ext: no edema in legs, significant edema in left arm compared to right Skin: warm, dry     Assessment and Plan   #hypertension # Elevated creatinine with baseline CKD stage III S: compliant with  amlodipine 10 mg, carvedilol 25 mg BID, clonidine 0.3 mg BID, lisinopril hctz 10-12.5 mg. Also took lasix a few doses less than 2 weeks ago-patient did have worsening of creatinine up to 1.8 with her typical baseline closer to 1-1.3.  BUN / creatinine ratio did not suggest dehydration.  Typical  GFR is in high 40s or 50s but most recent at 30  Home #s usually 150s/60s.  BP Readings from Last 3 Encounters:  09/15/19 (!) 164/70  09/11/19 (!) 154/65  09/03/19 (!) 150/96  A/P: Hypertension poorly controlled-we likely will stay off Lasix unless proBNP elevated-based off exam I do not suspect significant fluid overload.  Could consider increasing lisinopril hydrochlorothiazide depending on repeat lab findings. -Could also consider resistant hypertension work-up given she is on 5 agents -For CKD stage III/creatinine hopeful coming off of Lasix will be helpful  #Left upper extremity edema S: We discussed in the past strongly suspect this is related to lymphedema and recent surgery A/P: Patient is very concerned about swelling-she also has some pain in the arm.  We ordered an ultrasound  of her arm to be on safe side from DVT perspective but strongly suspect lymphedema as cause  #Shortness of breath #Diastolic dysfunction S: Patient with shortness of breath reported for close to a month-no other symptoms of COVID-19 of them potentially fatigue.  When we saw her last visit suggested COVID-19 testing but she has been unable to complete that.    We trialed several days of Lasix without improvement in shortness of breath  She gets short of breath with activity-feels like she would be short of breath walking around at [facility] but does not feel if she would be short of breath walking to the door. A/P: Given no worsening symptoms almost a month from initial complaint I do not think COVID-19 testing is is beneficial-we opted to defer now -We will update labs to look for obvious cause of shortness of breath as well as a chest x-ray.  I suspect weight gain/deconditioning as primary cause-does not look overtly fluid overloaded but also check proBNP -Had ultrasound last November and would repeat only if significantly elevated pro bnp-had diastolic grade 2 dysfunction but does not look like she can  tolerate Lasix -For weight and patient also admits to a lot of stress eating/overeating and she thinks this is because the weight gain -Patient had been seen by cardiology in January 2020-she was having shortness of breath at that time but later improved.  No pulmonary embolism on CT of the chest.  Patient never completed PFTs ordered by cardiology.  Low risk stress test 09/26/2018 -Could consider pulmonology referral   Recommended follow up: Verbally discussed 60-month follow-upAt the latest Future Appointments  Date Time Provider [place]  09/16/2019 11:00 AM Ander Purpura, PT [facility] None  03/11/2020  1:30 PM [facility] 1 [facility] None  03/11/2020  2:00 PM [name], MD [facility] None    Lab/Order associations:   ICD-10-CM   1. Left arm swelling  M79.89 VAS [name] UPPER EXTREMITY VENOUS DUPLEX  2. Hypertension associated with diabetes ([place])  E11.59 POCT Urinalysis Dipstick (Automated)   I10   3. Diastolic dysfunction  J50.09   4. Stage 3 chronic kidney disease, unspecified whether stage 3a or 3b CKD  N18.30   5. Shortness of breath  R06.02 Comprehensive metabolic panel    Pro b natriuretic peptide    DG Chest HPC  6. Edema, unspecified type  R60.9 CBC with Differential    Comprehensive metabolic panel    TSH    Pro b natriuretic peptide    POCT Urinalysis Dipstick (Automated)     Return precautions advised.  Garret Reddish, MD

## 2019-09-15 NOTE — Addendum Note (Signed)
Addended by: Francis Dowse T on: 09/15/2019 04:44 PM   Modules accepted: Orders

## 2019-09-15 NOTE — Addendum Note (Signed)
Addended by: Marin Olp on: 09/15/2019 04:52 PM   Modules accepted: Orders

## 2019-09-15 NOTE — Progress Notes (Signed)
Possible blood in urine- we are getting a follow up test called a urine microscopic examination. May need to refer to urology if there is actual blood on more accurate test.

## 2019-09-15 NOTE — Patient Instructions (Addendum)
Please stop by lab before you go If you do not have mychart- we will call you about results within 5 business days of [name] receiving them.  If you have mychart- we will send your results within 3 business days of [name] receiving them.  If abnormal or we want to clarify a result, we will call or mychart you to make sure you receive the message.  If you have questions or concerns or don't hear within 5-7 days, please send [name] a message or call us.   Please stop by x-ray before you go If you do not have mychart- we will call you about results within 5 business days of [name] receiving them.  If you have mychart- we will send your results within 3 business days of [name] receiving them.  If abnormal or we want to clarify a result, we will call or mychart you to make sure you receive the message.  If you have questions or concerns or don't hear within 5-7 days, please send [name] a message or call us.   I need to increase your blood pressure medicine but I want to make sure kidneys are going in right direction first. Since lasix didn't really help- lets discontinue that. I may need to do further workup on cause of high blood pressure potentially as well.   I think weight gain could be causing shortness of breath- lets pull back on the extra calories and try to lose a few lbs. I am going to make sure today that its not related to other issues though.   We will call you within two weeks about your referral for ultrasound of left arm to rule out clot but I really think its related to the lymph nodes that were removed. If you do not hear within 3 weeks, give [name] a call.    Recommended follow up:No follow-ups on file.

## 2019-09-16 ENCOUNTER — Other Ambulatory Visit: Payer: Self-pay | Admitting: Family Medicine

## 2019-09-16 ENCOUNTER — Other Ambulatory Visit: Payer: Self-pay

## 2019-09-16 ENCOUNTER — Ambulatory Visit: Payer: Medicare Other | Attending: Surgery

## 2019-09-16 ENCOUNTER — Encounter: Payer: Self-pay | Admitting: Hematology

## 2019-09-16 DIAGNOSIS — Z170 Estrogen receptor positive status [ER+]: Secondary | ICD-10-CM | POA: Diagnosis present

## 2019-09-16 DIAGNOSIS — C50412 Malignant neoplasm of upper-outer quadrant of left female breast: Secondary | ICD-10-CM

## 2019-09-16 DIAGNOSIS — I890 Lymphedema, not elsewhere classified: Secondary | ICD-10-CM | POA: Diagnosis present

## 2019-09-16 DIAGNOSIS — R0600 Dyspnea, unspecified: Secondary | ICD-10-CM

## 2019-09-16 LAB — CBC WITH DIFFERENTIAL/PLATELET
Basophils Absolute: 0.1 10*3/uL (ref 0.0–0.1)
Basophils Relative: 1 % (ref 0.0–3.0)
Eosinophils Absolute: 0.4 10*3/uL (ref 0.0–0.7)
Eosinophils Relative: 6 % — ABNORMAL HIGH (ref 0.0–5.0)
HCT: 37.3 % (ref 36.0–46.0)
Hemoglobin: 12.5 g/dL (ref 12.0–15.0)
Lymphocytes Relative: 17.3 % (ref 12.0–46.0)
Lymphs Abs: 1.3 10*3/uL (ref 0.7–4.0)
MCHC: 33.5 g/dL (ref 30.0–36.0)
MCV: 84.6 fl (ref 78.0–100.0)
Monocytes Absolute: 0.5 10*3/uL (ref 0.1–1.0)
Monocytes Relative: 6.9 % (ref 3.0–12.0)
Neutro Abs: 5.1 10*3/uL (ref 1.4–7.7)
Neutrophils Relative %: 68.8 % (ref 43.0–77.0)
Platelets: 345 10*3/uL (ref 150.0–400.0)
RBC: 4.41 Mil/uL (ref 3.87–5.11)
RDW: 16 % — ABNORMAL HIGH (ref 11.5–15.5)
WBC: 7.4 10*3/uL (ref 4.0–10.5)

## 2019-09-16 LAB — TSH: TSH: 1.71 u[IU]/mL (ref 0.35–4.50)

## 2019-09-16 LAB — COMPREHENSIVE METABOLIC PANEL
ALT: 5 U/L (ref 0–35)
AST: 10 U/L (ref 0–37)
Albumin: 4.3 g/dL (ref 3.5–5.2)
Alkaline Phosphatase: 110 U/L (ref 39–117)
BUN: 22 mg/dL (ref 6–23)
CO2: 30 mEq/L (ref 19–32)
Calcium: 10.2 mg/dL (ref 8.4–10.5)
Chloride: 104 mEq/L (ref 96–112)
Creatinine, Ser: 1.25 mg/dL — ABNORMAL HIGH (ref 0.40–1.20)
GFR: 50.13 mL/min — ABNORMAL LOW (ref >60.00)
Glucose, Bld: 77 mg/dL (ref 70–99)
Potassium: 4.6 mEq/L (ref 3.5–5.1)
Sodium: 142 mEq/L (ref 135–145)
Total Bilirubin: 0.3 mg/dL (ref 0.2–1.2)
Total Protein: 7.4 g/dL (ref 6.0–8.3)

## 2019-09-16 LAB — URINALYSIS, MICROSCOPIC ONLY

## 2019-09-16 LAB — SPECIMEN STATUS REPORT

## 2019-09-16 MED ORDER — LISINOPRIL-HYDROCHLOROTHIAZIDE 20-12.5 MG PO TABS: 1.0000 | ORAL_TABLET | Freq: Every day | ORAL | 3 refills | Status: DC

## 2019-09-16 NOTE — Progress Notes (Signed)
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  Patient approved. She has a copy of the approval letter as well as expense sheet and Outpatient pharmacy information to pick up Aromasin. She also received a gift card today.   She has my card for any additional financial questions or concerns.

## 2019-09-16 NOTE — Patient Instructions (Signed)
Access Code: [email or web address]  URL: [email or web address]  Date: 09/16/2019  Prepared by: Tomma Rakers   Exercises Standing Upper Cervical Flexion and Extension - 20 reps - 1 sets - 1x daily - 7x weekly Standing Cervical Sidebending AROM - 20 reps - 1 sets - 1x daily - 7x weekly Standing Cervical Rotation AROM - 20 reps - 1 sets - 1x daily - 7x weekly Standing Shoulder Flexion Full Range - 20 reps - 1 sets - 1x daily - 7x weekly Standing Shoulder Shrugs - 20 reps - 1 sets - 1x daily - 7x weekly Standing Scapular Retraction - 20 reps - 1 sets - 1x daily - 7x weekly Trunk Sidebending with Compression Garment - 20 reps - 1 sets - 1x daily - 7x weekly Standing Hip Extension with Counter Support - 20 reps - 1 sets - 1x daily - 7x weekly Elbow AROM Flexion & Extension Supinated Forearm - 20 reps - 1 sets - 1x daily - 7x weekly Wrist AROM Flexion Extension - 20 reps - 1 sets - 1x daily - 7x weekly Hand Fist Pumps - 20 reps - 1 sets - 1x daily - 7x weekly

## 2019-09-16 NOTE — Therapy (Signed)
[place], [place], [address] Phone: [phone]   Fax:  [phone]  Physical Therapy Evaluation  Patient Details  Name: Wendy [name] MRN: [id number] Date of Birth: 28-Nov-1940 Referring Provider (PT): Erroll Luna MD   Encounter Date: 09/16/2019  PT End of Session - 09/16/19 1203    Visit Number  1    Number of Visits  13    Date for PT Re-Evaluation  11/11/19    PT Start Time  1115    PT Stop Time  1200    PT Time Calculation (min)  45 min    Activity Tolerance  Patient tolerated treatment well    Behavior During Therapy  [facility] for tasks assessed/performed       Past Medical History:  Diagnosis Date  . Abnormal breast finding 2018   per pt/ having  a lot drainage from left breast nipple  . Anemia   . Anxiety   . Breast cancer ([place]) 11/2016   left/  . CKD (chronic kidney disease)   . Depression   . GERD (gastroesophageal reflux disease)    TUMS as needed  . HTN (hypertension)    states BP has been high recently; has been on med. x 20 yr.  . Hyperlipidemia   . Hyperplastic colon polyp   . HYPERTENSION, BENIGN SYSTEMIC 11/29/2006   Lisinopril hctz 10-12.3m, amlodipine 156m coreg 2573mID, clonidine 0.3 mg BID.   . I[place] Kitchensulin dependent diabetes mellitus   . Personal history of chemotherapy    2018/finished 6 weeks of chemo in Sep 2018  . Personal history of radiation therapy    2018 left breast/finished radiation in Sept 2018 per pt.    Past Surgical History:  Procedure Laterality Date  . AXILLARY LYMPH NODE DISSECTION Left 08/05/2019   Procedure: LEFT AXILLARY LYMPH NODE DISSECTION;  Surgeon: CorErroll LunaD;  [facility];  Laterality: Left;  . BREAST LUMPECTOMY Left 05/05/2008  . BREAST LUMPECTOMY Left 12/07/2016   malignant  . BREAST LUMPECTOMY WITH RADIOACTIVE SEED AND SENTINEL LYMPH NODE BIOPSY Left 12/07/2016   Procedure: LEFT BREAST  LUMPECTOMY WITH RADIOACTIVE SEED AND SENTINEL LYMPH NODE BIOPSY;  Surgeon: ThoErroll LunaD;  [facility];  Laterality: Left;  . IR FLUORO GUIDE PORT INSERTION RIGHT  01/04/2017  . IR US [place] VASC ACCESS RIGHT  01/04/2017  . PORT-A-CATH REMOVAL N/A 05/30/2017   Procedure: REMOVAL PORT-A-CATH;  Surgeon: CorErroll LunaD;  [facility];  Laterality: N/A;  . RE-EXCISION OF BREAST LUMPECTOMY Left 12/28/2016   Procedure: RE-EXCISION OF BREAST LUMPECTOMY;  Surgeon: ThoErroll LunaD;  [facility];  Laterality: Left;    There were no vitals filed for this visit.   Subjective Assessment - 09/16/19 1116    Subjective  Pt states that she had some swelling in her hand following her surgery in 2018. After her surgery in November of this year she has noticed a significant increase in edema in her L arm. She states that her overall function is getting better because right after surgery her daughter was helping her bathe but now she can bathe herself. She continues with difficulty lifting the arm becuase it is so heavy from the fluid.    Pertinent History  12/07/2016: L lumpectomy with 2 lymph node removal,Grade III, ER+/PR-/HER2- returned and matted lymph node mass was removed on 08/05/19  Patient Stated Goals  I want to be able to move my arm more so that it won't get to where I can't use my arm.    Currently in Pain?  Yes    Pain Score  3     Pain Location  Arm    Pain Orientation  Left    Pain Descriptors / Indicators  Aching;Tingling    Pain Type  Chronic pain    Pain Onset  More than a month ago    Pain Frequency  Constant    Aggravating Factors   nothing noted    Pain Relieving Factors  nothing noted.         [facility] PT Assessment - 09/16/19 0001      Assessment   Medical Diagnosis  L UE lymphedema    Referring Provider (PT)  Erroll Luna MD    Onset Date/Surgical Date  08/05/19     Hand Dominance  Right    Next MD Visit  12/16/2019    Prior Therapy  No      Precautions   Precautions  Other (comment)   Metastatic Breast Cancer     Balance Screen   Has the patient fallen in the past 6 months  No    Has the patient had a decrease in activity level because of a fear of falling?   No    Is the patient reluctant to leave their home because of a fear of falling?   No      Home Environment   Living Environment  Private residence    Living Arrangements  Children    Type of [place]      Prior Function   Level of [place]  Retired    Leisure  Sew       Cognition   Overall Cognitive Status  Within Functional Limits for tasks assessed      Posture/Postural Control   Posture/Postural Control  Postural limitations    Postural Limitations  Rounded Shoulders;Forward head      ROM / Strength   AROM / PROM / Strength  AROM      AROM   AROM Assessment Site  Shoulder    Right/Left Shoulder  Right;Left    Right Shoulder Flexion  116 Degrees    Right Shoulder ABduction  103 Degrees    Right Shoulder Internal Rotation  9 Degrees    Right Shoulder External Rotation  59 Degrees    Left Shoulder Flexion  38 Degrees    Left Shoulder ABduction  46 Degrees    Left Shoulder Internal Rotation  --   not performed due to abdct deficits   Left Shoulder External Rotation  --   not performed due to abdct deficits       LYMPHEDEMA/ONCOLOGY QUESTIONNAIRE - 09/16/19 1139      Surgeries   Lumpectomy Date  06/09/17    Sentinel Lymph Node Biopsy Date  06/09/17    Other Surgery Date  08/05/19    Number Lymph Nodes Removed  2   a matted group of lymph nodes was removed on 08/05/19      Treatment   Active Chemotherapy Treatment  No    Past Chemotherapy Treatment  Yes    Date  --   2018   Active Radiation Treatment  No    Past Radiation Treatment  Yes    Date  --   2018   Body Site  L  axilla and breast    Current Hormone Treatment  Yes     Date  09/11/19    Drug Name  Exemestane      What other symptoms do you have   Are you Having Heaviness or Tightness  Yes    Are you having Pain  Yes    Are you having pitting edema  Yes    Body Site  Dorsum of L hand    Is it Hard or Difficult finding clothes that fit  Yes    Do you have infections  No      Lymphedema Stage   Stage  STAGE 2 SPONTANEOUSLY IRREVERSIBLE      Lymphedema Assessments   Lymphedema Assessments  Upper extremities      Right Upper Extremity Lymphedema   15 cm Proximal to Olecranon Process  34.5 cm    10 cm Proximal to Olecranon Process  35.5 cm    Olecranon Process  24.5 cm    15 cm Proximal to Ulnar Styloid Process  24.4 cm    10 cm Proximal to Ulnar Styloid Process  23 cm    Just Proximal to Ulnar Styloid Process  15 cm    Across Hand at [employer]  18 cm    At [place] of 2nd Digit  6.4 cm      Left Upper Extremity Lymphedema   15 cm Proximal to Olecranon Process  42 cm    10 cm Proximal to Olecranon Process  43.5 cm    Olecranon Process  31 cm    15 cm Proximal to Ulnar Styloid Process  29.8 cm    10 cm Proximal to Ulnar Styloid Process  16.9 cm    Just Proximal to Ulnar Styloid Process  18 cm    Across Hand at [employer]  19.5 cm    At [place] of 2nd Digit  6.8 cm             Outpatient Rehab from 09/16/2019 [facility]  Lymphedema Life Impact Scale Total Score  27.94 %      Objective measurements completed on examination: See above findings.      [place] Adult PT Treatment/Exercise - 09/16/19 0001      Exercises   Exercises  Other Exercises    Other Exercises   Lymphatic facilitation exercises: cervical rotation R/L, Cervical side bend R/L, cervical flexion extension, Shoulder flexion, shoulder shrug, scapular retraction, Trunk side bending R/L, Hip ext R/L in standing, Bil elbow flexion/extension, Bil wrist flexion/extension, B fist pumps all performed 5x with demonstration and VC to avoid  pain.              PT Education - 09/16/19 1158    Education Details  Access Code: [email or web address], Pt was educated on the correct movements and to avoid pain throughout lymphatic facilitation exercises. She was educated on lymphedema including the anatomy and physiology of the lymphatic system with provided paper work. Discused possible use of vasopneumatic pump due to extent of lymphedema.    Person(s) Educated  Patient    Methods  Explanation;Demonstration;Handout;Verbal cues    Comprehension  Verbalized understanding;Returned demonstration       PT Short Term Goals - 09/16/19 1206      PT SHORT TERM GOAL #1   Title  Pt will be independent with initial HEP    Baseline  Pt currently does not have an HEP    Time  4  Period  Weeks    Status  New    Target Date  11/11/19        PT Long Term Goals - 09/16/19 1207      PT LONG TERM GOAL #1   Title  Pt will demonstrate 120 degrees L shoulder flexion/abduction within 4 weeks to demonstrate improved Functional ROM    Baseline  less than 90 degrees for both flexion and abduction of the LUE    Time  4    Period  Weeks    Status  New    Target Date  11/11/19      PT LONG TERM GOAL #2   Title  Pt will have 3 cm or greater circumferential measurement reduction in the brachium of the LUE in order to demonstrate reduction of fluid within 4 weeks.    Baseline  see measurements    Time  4    Period  Weeks    Status  New    Target Date  11/11/19      PT LONG TERM GOAL #3   Title  Pt will have a garment that she wears on a daily basis within 4 weeks in order to improve home management of lymphedema.    Baseline  Pt does not have a compression garment.    Time  4    Period  Weeks    Status  New    Target Date  11/11/19      PT LONG TERM GOAL #4   Title  Pt will improve LLIS score to 15% or less within 4 weeks to demonstrate improved quality of life with lymphedema.    Baseline  27%    Time  4    Period  Weeks    Status  New     Target Date  11/11/19      PT LONG TERM GOAL #5   Title  Pt will report pain 1/10 or less within 4 weeks in order to demonstrate improved quality of life.    Baseline  3/10    Time  4    Period  Weeks    Status  New    Target Date  11/11/19             Plan - 09/16/19 1203    Clinical Impression Statement  Pt presents to physical therapy for L UE lymphedema following L lumpectomy in 2018 and then re-excision in the same year. She had a re-occurance of breast cancer earlier this year and had lymph node removal in November resulting in removal of a matted clump of lymph nodes. She was experiencing some swelling prior to surgery but now has a significant amount of swelling in her LUE compared to her RUE. She also has a significant decrease in ROM of her L shoulder compared to her R shoulder. Pt will benefit form skilled physical therapy services in order to address the above limitations 3x/week for 4 weeks starting at the beginning of next year due to availability.    Personal Factors and Comorbidities  Age;Comorbidity 2    Comorbidities  CKD, Hx radiation and lymph node removal    Stability/Clinical Decision Making  Stable/Uncomplicated    Clinical Decision Making  Low    Rehab Potential  Good    PT Frequency  3x / week    PT Duration  4 weeks    PT Treatment/Interventions  Electrical Stimulation;Iontophoresis 24m/ml Dexamethasone;Moist Heat;Functional mobility training;Therapeutic activities;Therapeutic exercise;Neuromuscular re-education;Patient/family education;Manual techniques  PT Next Visit Plan  Begin wrapping, teaching self MLD and assess exercises    PT Home Exercise Plan  Access Code: [email or web address]    Recommended Other Services  tactile medical pump    Consulted and Agree with Plan of Care  Patient       Patient will benefit from skilled therapeutic intervention in order to improve the following deficits and impairments:  Decreased range of motion, Pain, Increased edema,  Postural dysfunction  Visit Diagnosis: Lymphedema of left arm  Malignant neoplasm of upper-outer quadrant of left breast in female, estrogen receptor positive ([place])     Problem List Patient Active Problem List   Diagnosis Date Noted  . Lymphedema of left arm 09/03/2019  . 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[place]) 12/05/2018  . Grief 05/10/2018  . Anemia 01/09/2018  . Fatigue 03/14/2017  . Edema 03/14/2017  . Shortness of breath 03/14/2017  . Port catheter in place 01/18/2017  . Rectal bleeding 01/17/2017  . Constipation 01/17/2017  . Malignant neoplasm of upper-outer quadrant of left breast in female, estrogen receptor positive ([place]) 11/20/2016  . CKD (chronic kidney disease) stage 3, GFR 30-59 ml/min ([facility]) 03/17/2016  . Osteopenia 01/05/2016  . 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 Seasonal allergies 12/01/2013  . Diastolic dysfunction 43/27/6147  . TOBACCO USE 04/13/2008  . Hyperlipidemia associated with type 2 diabetes mellitus ([place]) 11/29/2006  . OBESITY, BMI 30-35 11/29/2006  . Hypertension associated with diabetes ([place]) 11/29/2006    Ander Purpura, PT 09/16/2019, 12:10 PM  [place], [place], [address] Phone: [phone]   Fax:  [phone]  Name: Wendy [name] MRN: [id number] Date of Birth: [birth year]

## 2019-09-16 NOTE — Progress Notes (Signed)
Blood count/CBC were largely normal Kidney function has returned to baseline with coming back off the Lasix.  Liver is normal. Thyroid is normal No obvious cause of swelling  Since kidneys have improved I am going to increase lisinopril hydrochlorothiazide medication.  Please stop the 10-12.5 mg version and start the 20-12.5 mg version that I sent in for you.  Please schedule follow-up within 2 to 4 weeks to recheck blood pressure.  Bring all medications with you to next visit

## 2019-09-17 ENCOUNTER — Ambulatory Visit (HOSPITAL_COMMUNITY)
Admission: RE | Admit: 2019-09-17 | Discharge: 2019-09-17 | Disposition: A | Discharge status: Home / Self Care | Payer: Medicare Other | Source: Ambulatory Visit | Attending: Cardiology | Admitting: Cardiology

## 2019-09-17 DIAGNOSIS — M7989 Other specified soft tissue disorders: Secondary | ICD-10-CM | POA: Diagnosis not present

## 2019-09-17 LAB — PRO B NATRIURETIC PEPTIDE

## 2019-09-17 LAB — SPECIMEN STATUS REPORT

## 2019-09-18 NOTE — Progress Notes (Signed)
No blood clot in the arm-the swelling is related to lymph nodes that were removed

## 2019-09-19 ENCOUNTER — Telehealth: Payer: Self-pay

## 2019-09-19 ENCOUNTER — Other Ambulatory Visit: Payer: Self-pay | Admitting: Hematology

## 2019-09-19 ENCOUNTER — Other Ambulatory Visit: Payer: Self-pay | Admitting: Family Medicine

## 2019-09-19 NOTE — Telephone Encounter (Signed)
Patient called in wanting to know if Dr was finished with her application to send back to the mail service for medication    Please call and advise

## 2019-09-19 NOTE — Telephone Encounter (Signed)
It was sent 09/01/2019. I do not know why they are taking so long.

## 2019-09-23 ENCOUNTER — Other Ambulatory Visit: Payer: Self-pay | Admitting: Internal Medicine

## 2019-09-25 ENCOUNTER — Other Ambulatory Visit: Payer: Self-pay | Admitting: Family Medicine

## 2019-09-25 NOTE — Telephone Encounter (Signed)
On 12/14 looks like pt reported she wasn't taking.

## 2019-09-25 NOTE — Telephone Encounter (Signed)
Please see note from 09/15/2019- did she increase her lsiinopril- hctz as instructed? Why does she want more lasix- has swelling in legs worsened? If so get her in with one of my colleagues next week

## 2019-10-06 ENCOUNTER — Other Ambulatory Visit: Payer: Self-pay

## 2019-10-06 ENCOUNTER — Ambulatory Visit: Payer: Medicare Other

## 2019-10-06 ENCOUNTER — Other Ambulatory Visit: Payer: Medicare Other

## 2019-10-06 ENCOUNTER — Ambulatory Visit: Payer: Medicare Other | Admitting: Hematology

## 2019-10-07 ENCOUNTER — Ambulatory Visit: Payer: Medicare Other

## 2019-10-07 ENCOUNTER — Encounter: Payer: Self-pay | Admitting: Family Medicine

## 2019-10-07 ENCOUNTER — Ambulatory Visit (INDEPENDENT_AMBULATORY_CARE_PROVIDER_SITE_OTHER): Payer: Medicare Other | Admitting: Family Medicine

## 2019-10-07 VITALS — BP 158/68 | HR 100 | Temp 97.9°F | Ht 62.0 in | Wt 201.4 lb

## 2019-10-07 DIAGNOSIS — I10 Essential (primary) hypertension: Secondary | ICD-10-CM | POA: Diagnosis not present

## 2019-10-07 DIAGNOSIS — R3589 Other polyuria: Secondary | ICD-10-CM

## 2019-10-07 DIAGNOSIS — F3342 Major depressive disorder, recurrent, in full remission: Secondary | ICD-10-CM | POA: Diagnosis not present

## 2019-10-07 DIAGNOSIS — Z170 Estrogen receptor positive status [ER+]: Secondary | ICD-10-CM | POA: Diagnosis not present

## 2019-10-07 DIAGNOSIS — E1159 Type 2 diabetes mellitus with other circulatory complications: Secondary | ICD-10-CM

## 2019-10-07 DIAGNOSIS — C50412 Malignant neoplasm of upper-outer quadrant of left female breast: Secondary | ICD-10-CM | POA: Diagnosis not present

## 2019-10-07 DIAGNOSIS — R358 Other polyuria: Secondary | ICD-10-CM | POA: Diagnosis not present

## 2019-10-07 LAB — POC URINALSYSI DIPSTICK (AUTOMATED)
Bilirubin, UA: NEGATIVE
Blood, UA: POSITIVE
Glucose, UA: NEGATIVE
Ketones, UA: NEGATIVE
Leukocytes, UA: NEGATIVE
Nitrite, UA: NEGATIVE
Protein, UA: POSITIVE — AB
Spec Grav, UA: 1.015 (ref 1.010–1.025)
Urobilinogen, UA: 0.2 E.U./dL
pH, UA: 6 (ref 5.0–8.0)

## 2019-10-07 LAB — COMPREHENSIVE METABOLIC PANEL
ALT: 5 U/L (ref 0–35)
AST: 10 U/L (ref 0–37)
Albumin: 4.3 g/dL (ref 3.5–5.2)
Alkaline Phosphatase: 109 U/L (ref 39–117)
BUN: 21 mg/dL (ref 6–23)
CO2: 29 mEq/L (ref 19–32)
Calcium: 9.9 mg/dL (ref 8.4–10.5)
Chloride: 102 mEq/L (ref 96–112)
Creatinine, Ser: 1.33 mg/dL — ABNORMAL HIGH (ref 0.40–1.20)
GFR: 46.66 mL/min — ABNORMAL LOW (ref >60.00)
Glucose, Bld: 206 mg/dL — ABNORMAL HIGH (ref 70–99)
Potassium: 4 mEq/L (ref 3.5–5.1)
Sodium: 141 mEq/L (ref 135–145)
Total Bilirubin: 0.3 mg/dL (ref 0.2–1.2)
Total Protein: 7.2 g/dL (ref 6.0–8.3)

## 2019-10-07 LAB — TSH: TSH: 2.39 u[IU]/mL (ref 0.35–4.50)

## 2019-10-07 LAB — CBC WITH DIFFERENTIAL/PLATELET
Basophils Absolute: 0 10*3/uL (ref 0.0–0.1)
Basophils Relative: 0.6 % (ref 0.0–3.0)
Eosinophils Absolute: 0.3 10*3/uL (ref 0.0–0.7)
Eosinophils Relative: 3.4 % (ref 0.0–5.0)
HCT: 36.8 % (ref 36.0–46.0)
Hemoglobin: 12.1 g/dL (ref 12.0–15.0)
Lymphocytes Relative: 17.5 % (ref 12.0–46.0)
Lymphs Abs: 1.4 10*3/uL (ref 0.7–4.0)
MCHC: 32.9 g/dL (ref 30.0–36.0)
MCV: 84.8 fl (ref 78.0–100.0)
Monocytes Absolute: 0.5 10*3/uL (ref 0.1–1.0)
Monocytes Relative: 6.3 % (ref 3.0–12.0)
Neutro Abs: 5.7 10*3/uL (ref 1.4–7.7)
Neutrophils Relative %: 72.2 % (ref 43.0–77.0)
Platelets: 318 10*3/uL (ref 150.0–400.0)
RBC: 4.34 Mil/uL (ref 3.87–5.11)
RDW: 15.8 % — ABNORMAL HIGH (ref 11.5–15.5)
WBC: 7.9 10*3/uL (ref 4.0–10.5)

## 2019-10-07 MED ORDER — CARVEDILOL 25 MG PO TABS: 25.0000 mg | ORAL_TABLET | Freq: Two times a day (BID) | ORAL | 3 refills | Status: AC

## 2019-10-07 NOTE — Addendum Note (Signed)
Addended by: Christiana Fuchs on: 10/07/2019 11:03 AM   Modules accepted: Orders

## 2019-10-07 NOTE — Progress Notes (Signed)
Possible infection positive blood.  Team please add urine microscopic under hematuria.  We will await urine culture to decide on whether we want to treat with antibiotics

## 2019-10-07 NOTE — Patient Instructions (Addendum)
poor control today but not on carvedilol- we will go ahead and restart this and have her follow up in 1 month- would love if she would write down home #s when checked and bring all meds to follow up   Checking urine to make sure no infection  I think you may have tweaked a muscle or your back and it is radiating to right side- if pain continues we can evaluate further at follow up in 1 month- if worsens see [name] sooner   Please stop by lab before you go If you do not have mychart- we will call you about results within 5 business days of [name] receiving them.  If you have mychart- we will send your results within 3 business days of [name] receiving them.  If abnormal or we want to clarify a result, we will call or mychart you to make sure you receive the message.  If you have questions or concerns or don't hear within 5-7 days, please send [name] a message or call us.    Recommended follow up: Return in about 1 month (around 11/07/2019) for follow up- or sooner if needed.

## 2019-10-07 NOTE — Progress Notes (Addendum)
Phone [phone] In person visit   Subjective:   Wendy [name] is a 79 y.o. year old very pleasant female patient who presents for/with See problem oriented charting  ROS- Review of Systems  Constitutional: Negative.   HENT: Negative.   Eyes: Negative.   Respiratory: Positive for shortness of breath.        On going with walking   Cardiovascular: Positive for leg swelling.       B/l ankle swelling   Gastrointestinal: Negative.   Genitourinary: Positive for dysuria, flank pain, frequency and urgency.       Right flank pain x 2 weeks with frequency, urgency and painful urination   Skin: Negative.   Neurological: Negative.   Endo/Heme/Allergies: Negative.   Psychiatric/Behavioral: Negative.     This visit occurred during the SARS-CoV-2 public health emergency.  Safety protocols were in place, including screening questions prior to the visit, additional usage of staff PPE, and extensive cleaning of exam room while observing appropriate contact time as indicated for disinfecting solutions.   Past Medical History-  Patient Active Problem List   Diagnosis Date Noted  . Shortness of breath 03/14/2017    Priority: High  . Malignant neoplasm of upper-outer quadrant of left breast in female, estrogen receptor positive ([place]) 11/20/2016    Priority: High  . 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 Diastolic dysfunction 02/63/7858    Priority: High  . Lymphedema of left arm 09/03/2019    Priority: Medium  . 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[place]) 12/05/2018    Priority: Medium  . Rectal bleeding 01/17/2017    Priority: Medium  . CKD (chronic kidney disease) stage 3, GFR 30-59 ml/min (HCC) 03/17/2016    Priority: Medium  . Osteopenia 01/05/2016    Priority: Medium  . TOBACCO USE 04/13/2008    Priority: Medium  . Hyperlipidemia associated with type  2 diabetes mellitus ([place]) 11/29/2006    Priority: Medium  . OBESITY, BMI 30-35 11/29/2006    Priority: Medium  . Hypertension associated with diabetes ([place]) 11/29/2006    Priority: Medium  . Fatigue 03/14/2017    Priority: Low  . Edema 03/14/2017    Priority: Low  . Port catheter in place 01/18/2017    Priority: Low  . Constipation 01/17/2017    Priority: Low  . Seasonal allergies 12/01/2013    Priority: Low  . Grief 05/10/2018  . Anemia 01/09/2018    Medications- reviewed and updated Current Outpatient Medications  Medication Sig Dispense Refill  . amLODipine (NORVASC) 10 MG tablet TAKE 1 TABLET BY MOUTH EVERY DAY 90 tablet 1  . aspirin 81 MG tablet Take 81 mg by mouth daily.      . B-D INS SYRINGE 0.5CC/31GX5/16 31G X 5/16" 0.5 ML MISC USE AS DIRECTED 4 TIMES A DAY 300 each 1  . busPIRone (BUSPAR) 10 MG tablet Take 1 tablet (10 mg total) by mouth 3 (three) times daily. 270 tablet 0  . carvedilol (COREG) 25 MG tablet Take 1 tablet (25 mg total) by mouth 2 (two) times daily with a meal. 180 tablet 3  . cloNIDine (CATAPRES) 0.3 MG tablet TAKE 1 TABLET (0.3 MG TOTAL) BY MOUTH 2 (TWO) TIMES DAILY. 180 tablet 1  . exemestane (AROMASIN) 25 MG tablet Take 1 tablet (25 mg total) by mouth daily after breakfast. 30 tablet 2  . HYDROcodone-acetaminophen (NORCO/VICODIN) 5-325 MG tablet  Take 1 tablet by mouth every 8 (eight) hours as needed for moderate pain. 15 tablet 0  . Insulin Glargine (LANTUS SOLOSTAR) 100 UNIT/ML Solostar Pen Inject 28 units into the skin in am 10 pen 3  . Insulin Pen Needle 31G X 5 MM MISC Use pen needles for insulin injection daily 100 each 3  . insulin regular (NOVOLIN R) 100 units/mL injection Inject 0.14-0.18 mLs (14-18 Units total) into the skin 3 (three) times daily before meals. Relion. Please provide insulin syringes needed 20 mL 11  . Insulin Syringe-Needle U-100 (B-D INS SYRINGE 0.5CC/30GX1/2") 30G X 1/2" 0.5 ML MISC USE AS DIRECTED 4 TIMES A DAY 300 each 3    . letrozole (FEMARA) 2.5 MG tablet TAKE 1 TABLET BY MOUTH EVERY DAY 90 tablet 1  . lisinopril-hydrochlorothiazide (ZESTORETIC) 20-12.5 MG tablet Take 1 tablet by mouth daily. 90 tablet 3  . lovastatin (MEVACOR) 40 MG tablet TAKE 1 TABLET BY MOUTH EVERYDAY AT BEDTIME 90 tablet 3  . metFORMIN (GLUCOPHAGE) 500 MG tablet TAKE 2 TABLETS BY MOUTH DAILY WITH SUPPER. 180 tablet 2  . OVER THE COUNTER MEDICATION Take 1 tablet by mouth daily as needed. Stool softner for constipation, also drinks warm prune juice prn    . polyethylene glycol powder (GLYCOLAX/MIRALAX) 17 GM/SCOOP powder Take 17 g by mouth daily. 1 g 0  . traMADol (ULTRAM) 50 MG tablet TAKE 1 TABLET BY MOUTH EVERY 6 HOURS AS NEEDED FOR MODERATE/SEVERE PAIN (NECK AND SHOULDER PAIN). 20 tablet 0  . traZODone (DESYREL) 50 MG tablet Take 0.5-1 tablets (25-50 mg total) by mouth at bedtime as needed for sleep. 30 tablet 3  . furosemide (LASIX) 20 MG tablet TAKE 1 TABLET BY MOUTH EVERY DAY AS NEEDED (Patient not taking: Reported on 10/07/2019) 30 tablet 0   No current facility-administered medications for this visit.     Objective:  BP (!) 158/68   Pulse 100   Temp 97.9 F (36.6 C) (Temporal)   Ht 5\' 2"  (1.575 m)   Wt 201 lb 6.4 oz (91.4 kg)   LMP  (LMP Unknown)   SpO2 97%   BMI 36.84 kg/m  Gen: NAD, resting comfortably CV: RRR no murmurs rubs or gallops Lungs: CTAB no crackles, wheeze, rhonchi Abdomen: soft/nontender even with deep palpation in RUQ and right flank/nondistended/normal bowel sounds. No rebound or guarding.  Ext: 1+ edema Skin: warm, dry    Assessment and Plan   # Right side/Flank pain  S: Patient started having right side pain starting about two weeks ago. Describes as moderate soreness and recurrent with position changes like turning in bed.  Turned over in bed and felt a catch around two weeks ago. Peeing every hour- polyuria and feels pressure in stomach. No dysuria.  Denies any fever or nausea or vomiting or  diarrhea. Heating pad does not help  Had similar symptoms before thanksgiving with pain in right flank. Used laxative at that time and helped short term then symptoms worsened. Currently having bowel movement every 3-4 days- takes prune juice and miralax to help A/P: Right flank/side pain could be musculoskeletal- will get CMP,  As well as UA and culture given frequent urination- could be UTI related. She will continue to monitor- if worsens before 1 month follow up will see [name] sooner.   #hypertension S: compliant with  lisinopril 20-12.5mg  up from 10-12.5 mg, amlodipine 10mg ,  clonidine 0.3 mg BID.   Ran out of carvedilol two days ago. Has checked at home and reports  numbers are "good" though does not remember specifics.  BP Readings from Last 3 Encounters:  10/07/19 (!) 158/68  09/15/19 (!) 164/70  09/11/19 (!) 154/65  A/P: poor control today but not on carvedilol- we will go ahead and restart this and have her follow up in 1 month- would love if she would write down home #s when checked and bring all meds to follow up . -Start resistant hypertension workup as on 4 meds even without coreg and not well controlled -labs as below -she thinks 24 hour urine would be tough to collect- consider at next visit if no findings and BP poorly controlled - consider Duplex Doppler ultrasonography if BP not well controlled and no findings on above  Recommended follow up: Return in about 1 month (around 11/07/2019) for follow up- or sooner if needed. Future Appointments  Date Time Provider [place]  10/08/2019 11:00 AM Ander Purpura, PT [facility] None  10/10/2019 11:00 AM Ander Purpura, PT [facility] None  10/13/2019 11:00 AM Ander Purpura, PT [facility] None  10/15/2019 11:00 AM Ander Purpura, PT [facility] None  10/17/2019 11:00 AM Ander Purpura, PT [facility] None  10/20/2019 11:00 AM Ander Purpura, PT [facility] None  10/22/2019 11:00 AM Ander Purpura, PT [facility] None  10/24/2019  11:00 AM Ander Purpura, PT [facility] None  10/27/2019 11:00 AM Ander Purpura, PT [facility] None  10/29/2019 11:00 AM Ander Purpura, PT [facility] None  10/31/2019 11:00 AM Ander Purpura, PT [facility] None  11/03/2019 11:00 AM Ander Purpura, PT [facility] None  03/11/2020  1:30 PM [facility] 1 [facility] None  03/11/2020  2:00 PM [name], MD [facility] None   Lab/Order associations:   ICD-10-CM   1. Resistant hypertension  I10 CBC with Differential    Comprehensive metabolic panel    TSH    PTH, intact (no Ca)    Aldosterone + renin activity w/ ratio  2. Polyuria  R35.8 POC UA    Urine culture  3. Recurrent major depressive disorder, in full remission ([place]) Chronic -well controlled today with PHQ9 of under 5. Feels trazodone helping for sleep and buspirone helps with anxiety. Continue current meds F33.42   4. Hypertension associated with diabetes ([place]) Chronic E11.59    I10   5. Malignant neoplasm of upper-outer quadrant of left breast in female, estrogen receptor positive ([place]) Chronic -stable, remains on femara after treatment for cancer. Continue oncology follow up  C50.412    Z17.0    Meds ordered this encounter  Medications  . carvedilol (COREG) 25 MG tablet    Sig: Take 1 tablet (25 mg total) by mouth 2 (two) times daily with a meal.    Dispense:  180 tablet    Refill:  3    Return precautions advised.  Garret Reddish, MD

## 2019-10-08 ENCOUNTER — Other Ambulatory Visit: Payer: Self-pay

## 2019-10-08 ENCOUNTER — Ambulatory Visit: Payer: Medicare Other | Attending: Surgery

## 2019-10-08 DIAGNOSIS — Z170 Estrogen receptor positive status [ER+]: Secondary | ICD-10-CM | POA: Diagnosis not present

## 2019-10-08 DIAGNOSIS — R319 Hematuria, unspecified: Secondary | ICD-10-CM

## 2019-10-08 DIAGNOSIS — C50412 Malignant neoplasm of upper-outer quadrant of left female breast: Secondary | ICD-10-CM | POA: Insufficient documentation

## 2019-10-08 DIAGNOSIS — I890 Lymphedema, not elsewhere classified: Secondary | ICD-10-CM | POA: Diagnosis not present

## 2019-10-08 NOTE — Therapy (Signed)
[place], [place], [address] Phone: [phone]   Fax:  [phone]  Physical Therapy Treatment  Patient Details  Name: Wendy [name] MRN: [id number] Date of Birth: [birth year] Referring Provider (PT): Erroll Luna MD   Encounter Date: 10/08/2019  PT End of Session - 10/08/19 1049    Visit Number  2    Number of Visits  13    Date for PT Re-Evaluation  11/11/19    PT Start Time  1050    PT Stop Time  1158    PT Time Calculation (min)  68 min    Activity Tolerance  Patient tolerated treatment well    Behavior During Therapy  [facility] for tasks assessed/performed       Past Medical History:  Diagnosis Date  . Abnormal breast finding 2018   per pt/ having  a lot drainage from left breast nipple  . Anemia   . Anxiety   . Breast cancer ([place]) 11/2016   left/  . CKD (chronic kidney disease)   . Depression   . GERD (gastroesophageal reflux disease)    TUMS as needed  . HTN (hypertension)    states BP has been high recently; has been on med. x 20 yr.  . Hyperlipidemia   . Hyperplastic colon polyp   . HYPERTENSION, BENIGN SYSTEMIC 11/29/2006   Lisinopril hctz 10-12.43m, amlodipine 168m coreg 2565mID, clonidine 0.3 mg BID.   . IMarland Kitchensulin dependent diabetes mellitus   . Personal history of chemotherapy    2018/finished 6 weeks of chemo in Sep 2018  . Personal history of radiation therapy    2018 left breast/finished radiation in Sept 2018 per pt.    Past Surgical History:  Procedure Laterality Date  . AXILLARY LYMPH NODE DISSECTION Left 08/05/2019   Procedure: LEFT AXILLARY LYMPH NODE DISSECTION;  Surgeon: CorErroll LunaD;  [facility];  Laterality: Left;  . BREAST LUMPECTOMY Left 05/05/2008  . BREAST LUMPECTOMY Left 12/07/2016   malignant  . BREAST LUMPECTOMY WITH RADIOACTIVE SEED AND SENTINEL LYMPH NODE BIOPSY Left 12/07/2016   Procedure: LEFT BREAST  LUMPECTOMY WITH RADIOACTIVE SEED AND SENTINEL LYMPH NODE BIOPSY;  Surgeon: ThoErroll LunaD;  [facility];  Laterality: Left;  . IR FLUORO GUIDE PORT INSERTION RIGHT  01/04/2017  . IR US [place] VASC ACCESS RIGHT  01/04/2017  . PORT-A-CATH REMOVAL N/A 05/30/2017   Procedure: REMOVAL PORT-A-CATH;  Surgeon: CorErroll LunaD;  [facility];  Laterality: N/A;  . RE-EXCISION OF BREAST LUMPECTOMY Left 12/28/2016   Procedure: RE-EXCISION OF BREAST LUMPECTOMY;  Surgeon: ThoErroll LunaD;  [facility];  Laterality: Left;    There were no vitals filed for this visit.  Subjective Assessment - 10/08/19 1049    Subjective  Pt reports that she had to miss her last appointment becuase she was experiencing a pain in her R side. She went to her PCP and he believes she may have a pulled muscle in her side but is doing blood work. She states that it bothers her if she is trying to get up from laying down or twists.    Pertinent History  12/07/2016: L lumpectomy with 2 lymph node removal,Grade III, ER+/PR-/HER2- returned and matted lymph node mass was removed on 08/05/19    Patient Stated Goals  I want to be able to move my arm more  so that it won't get to where I can't use my arm.    Currently in Pain?  No/denies    Pain Score  0-No pain                  Outpatient Rehab from 09/16/2019 in [place]  Lymphedema Life Impact Scale Total Score  27.94 %           [facility] Adult PT Treatment/Exercise - 10/08/19 0001      Manual Therapy   Manual Therapy  Manual Lymphatic Drainage (MLD);Compression Bandaging    Manual Lymphatic Drainage (MLD)  In supine with head elevated less than 30 degrees: 5 diaphragmatic breathes, Bil axillary nodes, established anterior inter-axillary anastomosis, L axillo-inguinal anastomosis, L inguinal nodes, established L axillo-inguinal  anastomosis, lateral upper arm, medial to lateral upper arm, lateral upper arm, re-established anastomosis, all surfaces of the antebrachium, dorsum of the hand then reworked all surfaces; followed by deep abdominals. Pt was educated throughout with demonstration and return demonstration with an emphasison skin stretch, direction and pressure using VC, demonstration and tactile cueing using hand over hand for correct pressure and direction.     Compression Bandaging  Pt LUE was bandages with compression bandage using 1x cellona, 1x artiflex, 1x 6 cm for hand, 1x 8 cm and 1x 10 cm for spiral up the arm. Cross made at the arm with 8 cm short stretch bandage. Gradiant checked for appropriate stiffness with increased at the hand decreasing toward proximal brachium.              PT Education - 10/08/19 1130    Education Details  Pt was educated on self MLD this session and provided with handout using VC, demonstration and hand over hand tactile cueing in order to emphasize correct direction, pressure and skin stretch. Pt was educated on reasons to remove compression wrap and provided with information on reasons to remove wrap if exercise and removing the top bandage due to not alleviate symptoms of throbbing, tingling, numbness or pain. Pt educated to remove her wrap on Friday if she is unable to come Friday.    Person(s) Educated  Patient    Methods  Explanation;Demonstration;Tactile cues;Verbal cues;Handout    Comprehension  Verbalized understanding;Returned demonstration       PT Short Term Goals - 09/16/19 1206      PT SHORT TERM GOAL #1   Title  Pt will be independent with initial HEP    Baseline  Pt currently does not have an HEP    Time  4    Period  Weeks    Status  New    Target Date  11/11/19        PT Long Term Goals - 09/16/19 1207      PT LONG TERM GOAL #1   Title  Pt will demonstrate 120 degrees L shoulder flexion/abduction within 4 weeks to demonstrate improved Functional  ROM    Baseline  less than 90 degrees for both flexion and abduction of the LUE    Time  4    Period  Weeks    Status  New    Target Date  11/11/19      PT LONG TERM GOAL #2   Title  Pt will have 3 cm or greater circumferential measurement reduction in the brachium of the LUE in order to demonstrate reduction of fluid within 4 weeks.    Baseline  see measurements    Time  4  Period  Weeks    Status  New    Target Date  11/11/19      PT LONG TERM GOAL #3   Title  Pt will have a garment that she wears on a daily basis within 4 weeks in order to improve home management of lymphedema.    Baseline  Pt does not have a compression garment.    Time  4    Period  Weeks    Status  New    Target Date  11/11/19      PT LONG TERM GOAL #4   Title  Pt will improve LLIS score to 15% or less within 4 weeks to demonstrate improved quality of life with lymphedema.    Baseline  27%    Time  4    Period  Weeks    Status  New    Target Date  11/11/19      PT LONG TERM GOAL #5   Title  Pt will report pain 1/10 or less within 4 weeks in order to demonstrate improved quality of life.    Baseline  3/10    Time  4    Period  Weeks    Status  New    Target Date  11/11/19            Plan - 10/08/19 1048    Clinical Impression Statement  Pt presents to physical therapy with continued edema in her L UE. She was educated on self MLD with step by step instructions with the physical therapist performing steps followed by pt performing steps using VC, tactile cueing and demonstration for accurate performance. Emphasis was placed on skin stretch, direction and pressure in order to increase pt effectiveness with self MLD. Pt was wrapped with compression wrap using short stretch bandages from the hand to the axilla with education on reasons to remove the wrap and to not leave the wrap on for greater then 2-3 days. Pt will benefit from continued POC at this time.    Personal Factors and Comorbidities   Age;Comorbidity 2    Comorbidities  CKD, Hx radiation and lymph node removal    Rehab Potential  Good    PT Frequency  3x / week    PT Duration  4 weeks    PT Treatment/Interventions  Electrical Stimulation;Iontophoresis 55m/ml Dexamethasone;Moist Heat;Functional mobility training;Therapeutic activities;Therapeutic exercise;Neuromuscular re-education;Patient/family education;Manual techniques    PT Next Visit Plan  Continue with wrapping and MLD    PT Home Exercise Plan  Access Code: [email or web address]   Consulted and Agree with Plan of Care  Patient       Patient will benefit from skilled therapeutic intervention in order to improve the following deficits and impairments:  Decreased range of motion, Pain, Increased edema, Postural dysfunction  Visit Diagnosis: Lymphedema of left arm  Malignant neoplasm of upper-outer quadrant of left breast in female, estrogen receptor positive ([place]     Problem List Patient Active Problem List   Diagnosis Date Noted  . Lymphedema of left arm 09/03/2019  . 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[place] 12/05/2018  . Grief 05/10/2018  . Anemia 01/09/2018  . Fatigue 03/14/2017  . Edema 03/14/2017  . Shortness of breath 03/14/2017  . Port catheter in place 01/18/2017  . Rectal bleeding 01/17/2017  . Constipation 01/17/2017  . Malignant neoplasm of upper-outer quadrant of left breast in female, estrogen receptor positive ([place] 11/20/2016  . CKD (chronic kidney disease) stage 3, GFR 30-59 ml/min ([facility]) 03/17/2016  .  Osteopenia 01/05/2016  . 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 Seasonal allergies 12/01/2013  . Diastolic dysfunction 81/15/7262  . TOBACCO USE 04/13/2008  . Hyperlipidemia associated with type 2 diabetes mellitus ([place]) 11/29/2006  . OBESITY, BMI 30-35 11/29/2006  . Hypertension associated with diabetes ([place]) 11/29/2006     Ander Purpura, PT 10/08/2019, 12:00 PM  [place], [place], [address] Phone: [phone]   Fax:  [phone]  Name: SERAPHINA MITCHNER MRN: [id number] Date of Birth: 12/26/1940

## 2019-10-08 NOTE — Patient Instructions (Signed)
Deep Effective Breath   Standing, sitting, or laying down, place both hands on the belly. Take a deep breath IN, expanding the belly; then breath OUT, contracting the belly. Repeat __5__ times. Do __2-3__ sessions per day and before your self massage.  http://gt2.exer.us/866   Copyright  VHI. All rights reserved.  Axilla to Axilla - Sweep   On both sides make 5 circles in the armpit, then pump _5__ times from involved armpit across chest to uninvolved armpit, making a pathway. Do _1__ time per day.  Copyright  VHI. All rights reserved.  Axilla to Inguinal Nodes - Sweep   On involved side, make 5 circles at groin at panty line, then pump _5__ times from armpit along side of trunk to outer hip, making your other pathway. Do __1_ time per day.  Copyright  VHI. All rights reserved.  Arm Posterior: Elbow to Shoulder - Sweep   Pump _5__ times from back of elbow to top of shoulder. Then inner to outer upper arm _5_ times, then outer arm again _5_ times. Then back to the pathways _2-3_ times. Do _1__ time per day.  Copyright  VHI. All rights reserved.  ARM: [place] Wrist to Elbow - Sweep   Pump or stationary circles _5__ times from wrist to elbow making sure to do both sides of the forearm. Then retrace your steps to the outer arm, and the pathways _2-3_ times each. Do _1__ time per day.  Copyright  VHI. All rights reserved.  ARM: Dorsum of Hand to Shoulder - Sweep   Pump or stationary circles _5__ times on back of hand including knuckle spaces and individual fingers if needed working up towards the wrist, then retrace all your steps working back up the forearm, doing both sides; upper outer arm and back to your pathways _2-3_ times each. Then do 5 circles again at uninvolved armpit and involved groin where you started! Good job!! Do __1_ time per day.  Copyright  VHI. All rights reserved.     PLEASE KEEP YOUR BANDAGES ON AS LONG AS POSSIBLE TO GET THE BEST SWELLING  REDUCTION. Should your bandages become uncomfortable or feel too tight, follow these steps: 1. Elevate your extremity higher than your heart.  2. Try to move your arm or leg joints against the firmness of the bandage to help with moving the fluid and allow the bandages to loosen a bit.  3. If the bandaging is still is too tight, it is ok to carefully remove the top layer.  There will still be more layers under it that can provide compression to your extremity. 4. Finally, if you STILL have significant pain after trying these steps, it is ok to take the bandage off.  Check your skin carefully for any signs of irritation  5. PLEASE bring ALL bandage materials back to your next appointment as we will reuse what we can TAKE CARE OF YOUR BANDAGES SO THEY WILL LAST LONGER AND STAY IN BETTER CONDITION Washing bandages:  Wash periodically using a mild detergent in warm water.  Do not use fabric softener or bleach.  Place bandages in a mesh lingerie bag or in a tied off pillow case and use the gentle cycle of the washing machine or hand wash. If you hand wash, you may want to put them in the spin cycle of your washer to get the extra water out, but make sure you put them in a mesh bag first. Do not wring or stretch them while they are  wet.  Drying bandages: Lay the bandages out smoothly on a towel away from direct sunlight or heating sources that can damage the fabric. Rolling bandages in a towel and gently squeezing the towel to remove excess water before laying them out can speed up the process.  If you use a drying rack, place a towel on top of the rack to lay the bandages on.  If they hang down to dry, they fabric could be stretched out and the bandage will lose its compression.   Or, keep bandages in the mesh bag and dry them in the dryer on the low or no heat cycle. Rolling bandages: Please roll your bandages after drying them so they are ready for your next treatment. If they are rolled too loose, they will  be difficult to apply.  If rolled too tight, they can get stretched out.   TAKE CARE OF YOUR SKIN 1. Apply a low pH moisturizing lotion to your skin daily 2. Avoid scratching your skin 3. Treat skin irritations quickly  4. Know the 5 warning signs of infection: redness, pain, warmth to touch, fever and increased swelling.  Call your physician immediately if you notice any of these signs of a possible infection.

## 2019-10-10 ENCOUNTER — Ambulatory Visit: Payer: Medicare Other

## 2019-10-13 ENCOUNTER — Other Ambulatory Visit: Payer: Self-pay

## 2019-10-13 ENCOUNTER — Ambulatory Visit: Payer: Medicare Other

## 2019-10-13 DIAGNOSIS — Z170 Estrogen receptor positive status [ER+]: Secondary | ICD-10-CM | POA: Diagnosis not present

## 2019-10-13 DIAGNOSIS — C50412 Malignant neoplasm of upper-outer quadrant of left female breast: Secondary | ICD-10-CM

## 2019-10-13 DIAGNOSIS — I890 Lymphedema, not elsewhere classified: Secondary | ICD-10-CM

## 2019-10-13 LAB — PARATHYROID HORMONE, INTACT (NO CA): PTH: 67 pg/mL — ABNORMAL HIGH (ref 14–64)

## 2019-10-13 LAB — ALDOSTERONE + RENIN ACTIVITY W/ RATIO
ALDO / PRA Ratio: 0.3 Ratio — ABNORMAL LOW (ref 0.9–28.9)
Aldosterone: 1 ng/dL
Renin Activity: 3.5 ng/mL/h (ref 0.25–5.82)

## 2019-10-13 LAB — URINE CULTURE
MICRO NUMBER:: 10008793
Result:: NO GROWTH
SPECIMEN QUALITY:: ADEQUATE

## 2019-10-13 MED FILL — EXEMESTANE 25 MG TABS: 25 | 30 days supply | Qty: 30 | Fill #1

## 2019-10-13 NOTE — Therapy (Signed)
[place], [place], [address] Phone: [phone]   Fax:  [phone]  Physical Therapy Treatment  Patient Details  Name: Wendy [name] MRN: [id number] Date of Birth: 10-05-1940 Referring Provider (PT): Erroll Luna MD   Encounter Date: 10/13/2019  PT End of Session - 10/13/19 1104    Visit Number  3    Number of Visits  13    Date for PT Re-Evaluation  11/11/19    PT Start Time  1100    PT Stop Time  1148    PT Time Calculation (min)  48 min    Activity Tolerance  Patient tolerated treatment well    Behavior During Therapy  [facility] for tasks assessed/performed       Past Medical History:  Diagnosis Date  . Abnormal breast finding 2018   per pt/ having  a lot drainage from left breast nipple  . Anemia   . Anxiety   . Breast cancer ([place]) 11/2016   left/  . CKD (chronic kidney disease)   . Depression   . GERD (gastroesophageal reflux disease)    TUMS as needed  . HTN (hypertension)    states BP has been high recently; has been on med. x 20 yr.  . Hyperlipidemia   . Hyperplastic colon polyp   . HYPERTENSION, BENIGN SYSTEMIC 11/29/2006   Lisinopril hctz 10-12.33m, amlodipine 136m coreg 2572mID, clonidine 0.3 mg BID.   . IMarland Kitchensulin dependent diabetes mellitus   . Personal history of chemotherapy    2018/finished 6 weeks of chemo in Sep 2018  . Personal history of radiation therapy    2018 left breast/finished radiation in Sept 2018 per pt.    Past Surgical History:  Procedure Laterality Date  . AXILLARY LYMPH NODE DISSECTION Left 08/05/2019   Procedure: LEFT AXILLARY LYMPH NODE DISSECTION;  Surgeon: CorErroll LunaD;  [facility];  Laterality: Left;  . BREAST LUMPECTOMY Left 05/05/2008  . BREAST LUMPECTOMY Left 12/07/2016   malignant  . BREAST LUMPECTOMY WITH RADIOACTIVE SEED AND SENTINEL LYMPH NODE BIOPSY Left 12/07/2016   Procedure: LEFT BREAST  LUMPECTOMY WITH RADIOACTIVE SEED AND SENTINEL LYMPH NODE BIOPSY;  Surgeon: ThoErroll LunaD;  [facility];  Laterality: Left;  . IR FLUORO GUIDE PORT INSERTION RIGHT  01/04/2017  . IR US [place] VASC ACCESS RIGHT  01/04/2017  . PORT-A-CATH REMOVAL N/A 05/30/2017   Procedure: REMOVAL PORT-A-CATH;  Surgeon: CorErroll LunaD;  [facility];  Laterality: N/A;  . RE-EXCISION OF BREAST LUMPECTOMY Left 12/28/2016   Procedure: RE-EXCISION OF BREAST LUMPECTOMY;  Surgeon: ThoErroll LunaD;  [facility];  Laterality: Left;    There were no vitals filed for this visit.  Subjective Assessment - 10/13/19 1104    Subjective  Pt reports that she started to feel tightness in her hand on Saturday and took her wrap off that day.    Pertinent History  12/07/2016: L lumpectomy with 2 lymph node removal,Grade III, ER+/PR-/HER2- returned and matted lymph node mass was removed on 08/05/19    Patient Stated Goals  I want to be able to move my arm more so that it won't get to where I can't use my arm.    Currently in Pain?  No/denies    Pain Score  0-No pain  [facility]  Lymphedema Life Impact Scale Total Score  27.94 %           [facility] Adult PT Treatment/Exercise - 10/13/19 0001      Manual Therapy   Manual Therapy  Manual Lymphatic Drainage (MLD);Compression Bandaging    Manual Lymphatic Drainage (MLD)  In supine with head elevated less than 30 degrees: 5 diaphragmatic breathes, Bil axillary nodes, established anterior inter-axillary anastomosis, L axillo-inguinal anastomosis, L inguinal nodes, established L axillo-inguinal anastomosis, lateral upper arm, medial to lateral upper arm, lateral upper arm, re-established anastomosis, all surfaces of the antebrachium, dorsum of the hand then reworked all surfaces;  followed by deep abdominals.     Compression Bandaging  Pt LUE was bandages with compression bandage using 1x cellona, 1x artiflex, 1x 6 cm for hand, 1x 8 cm and 1x 10 cm for spiral up the arm. Cross made at the arm with 8 cm short stretch bandage. Gradiant checked for appropriate stiffness with increased at the hand decreasing toward proximal brachium.              PT Education - 10/13/19 1108    Education Details  Pt will continue with ML at home at this time. she will remove wrap if she notices any nubmness, tingling or aching pain that is not relieved by exercising or removing the top bandage of the wrap.    Person(s) Educated  Patient    Methods  Explanation    Comprehension  Verbalized understanding       PT Short Term Goals - 09/16/19 1206      PT SHORT TERM GOAL #1   Title  Pt will be independent with initial HEP    Baseline  Pt currently does not have an HEP    Time  4    Period  Weeks    Status  New    Target Date  11/11/19        PT Long Term Goals - 09/16/19 1207      PT LONG TERM GOAL #1   Title  Pt will demonstrate 120 degrees L shoulder flexion/abduction within 4 weeks to demonstrate improved Functional ROM    Baseline  less than 90 degrees for both flexion and abduction of the LUE    Time  4    Period  Weeks    Status  New    Target Date  11/11/19      PT LONG TERM GOAL #2   Title  Pt will have 3 cm or greater circumferential measurement reduction in the brachium of the LUE in order to demonstrate reduction of fluid within 4 weeks.    Baseline  see measurements    Time  4    Period  Weeks    Status  New    Target Date  11/11/19      PT LONG TERM GOAL #3   Title  Pt will have a garment that she wears on a daily basis within 4 weeks in order to improve home management of lymphedema.    Baseline  Pt does not have a compression garment.    Time  4    Period  Weeks    Status  New    Target Date  11/11/19      PT LONG TERM GOAL #4   Title  Pt will  improve LLIS score to 15% or less within 4 weeks to demonstrate improved quality of life with  lymphedema.    Baseline  27%    Time  4    Period  Weeks    Status  New    Target Date  11/11/19      PT LONG TERM GOAL #5   Title  Pt will report pain 1/10 or less within 4 weeks in order to demonstrate improved quality of life.    Baseline  3/10    Time  4    Period  Weeks    Status  New    Target Date  11/11/19            Plan - 10/13/19 1103    Clinical Impression Statement  Pt continues with edema in her LUE. MLD was performed for the anterior trunk, L breast and LUE. Scarring at the L nipple performed myofascial release in this area with light cross friction release; very minimal improvement by end of session. PT was wrapped with compression wrap using short stretch bandages from the hand to the axilla with education on reasons to remove the wrap and to note leave wrap on for greater than 2-3 days. Pt will benefit frmo continued POC at this time.    Personal Factors and Comorbidities  Age;Comorbidity 2    Comorbidities  CKD, Hx radiation and lymph node removal    Rehab Potential  Good    PT Frequency  3x / week    PT Duration  4 weeks    PT Treatment/Interventions  Electrical Stimulation;Iontophoresis 64m/ml Dexamethasone;Moist Heat;Functional mobility training;Therapeutic activities;Therapeutic exercise;Neuromuscular re-education;Patient/family education;Manual techniques    PT Next Visit Plan  Continue with wrapping and MLD, measure next session.    PT Home Exercise Plan  Access Code: [email or web address]   Consulted and Agree with Plan of Care  Patient       Patient will benefit from skilled therapeutic intervention in order to improve the following deficits and impairments:  Decreased range of motion, Pain, Increased edema, Postural dysfunction  Visit Diagnosis: Lymphedema of left arm  Malignant neoplasm of upper-outer quadrant of left breast in female, estrogen receptor positive  ([place]     Problem List Patient Active Problem List   Diagnosis Date Noted  . Lymphedema of left arm 09/03/2019  . 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[place] 12/05/2018  . Grief 05/10/2018  . Anemia 01/09/2018  . Fatigue 03/14/2017  . Edema 03/14/2017  . Shortness of breath 03/14/2017  . Port catheter in place 01/18/2017  . Rectal bleeding 01/17/2017  . Constipation 01/17/2017  . Malignant neoplasm of upper-outer quadrant of left breast in female, estrogen receptor positive ([place] 11/20/2016  . CKD (chronic kidney disease) stage 3, GFR 30-59 ml/min (HCC) 03/17/2016  . Osteopenia 01/05/2016  . 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 Seasonal allergies 12/01/2013  . Diastolic dysfunction 138/25/0539 . TOBACCO USE 04/13/2008  . Hyperlipidemia associated with type 2 diabetes mellitus ([place] 11/29/2006  . OBESITY, BMI 30-35 11/29/2006  . Hypertension associated with diabetes ([place] 11/29/2006    CAnder Purpura PT 10/13/2019, 11:49 AM  [place] [address]Phone: [phone]  Fax:  [phone] Name: LDYASIA FIRESTINEMRN: [id number] of Birth: 910-Feb-1942

## 2019-10-14 ENCOUNTER — Telehealth: Payer: Self-pay

## 2019-10-14 NOTE — Telephone Encounter (Signed)
I had sent in the request end of Nov. I do not know why they have not sent it. I have faxed the request again today.  We do not have Lantus samples but per Dr. Cruzita Lederer we can give 3 pens of Tresiba.

## 2019-10-14 NOTE — Telephone Encounter (Signed)
Patient called in requesting Dr send in application to [facility] for her medications because she's almost out. Asking for samples    Please call and advise

## 2019-10-15 ENCOUNTER — Other Ambulatory Visit: Payer: Self-pay

## 2019-10-15 ENCOUNTER — Ambulatory Visit: Payer: Medicare Other

## 2019-10-15 DIAGNOSIS — I890 Lymphedema, not elsewhere classified: Secondary | ICD-10-CM | POA: Diagnosis not present

## 2019-10-15 DIAGNOSIS — C50412 Malignant neoplasm of upper-outer quadrant of left female breast: Secondary | ICD-10-CM | POA: Diagnosis not present

## 2019-10-15 DIAGNOSIS — Z170 Estrogen receptor positive status [ER+]: Secondary | ICD-10-CM | POA: Diagnosis not present

## 2019-10-15 NOTE — Telephone Encounter (Signed)
Notified patient of message from Dr. Gherghe, patient expressed understanding and agreement. No further questions.  

## 2019-10-15 NOTE — Therapy (Signed)
[place], [place], [address] Phone: [phone]   Fax:  [phone]  Physical Therapy Treatment  Patient Details  Name: Wendy [name] MRN: [id number] Date of Birth: [birth year] Referring Provider (PT): Erroll Luna MD   Encounter Date: 10/15/2019  PT End of Session - 10/15/19 1056    Visit Number  4    Number of Visits  13    Date for PT Re-Evaluation  11/11/19    Authorization Type  medicare needs 10th visit note    PT Start Time  1055    PT Stop Time  1120    PT Time Calculation (min)  25 min    Activity Tolerance  Patient tolerated treatment well    Behavior During Therapy  [facility] for tasks assessed/performed       Past Medical History:  Diagnosis Date  . Abnormal breast finding 2018   per pt/ having  a lot drainage from left breast nipple  . Anemia   . Anxiety   . Breast cancer ([place]) 11/2016   left/  . CKD (chronic kidney disease)   . Depression   . GERD (gastroesophageal reflux disease)    TUMS as needed  . HTN (hypertension)    states BP has been high recently; has been on med. x 20 yr.  . Hyperlipidemia   . Hyperplastic colon polyp   . HYPERTENSION, BENIGN SYSTEMIC 11/29/2006   Lisinopril hctz 10-12.39m, amlodipine 152m coreg 2523mID, clonidine 0.3 mg BID.   . IMarland Kitchensulin dependent diabetes mellitus   . Personal history of chemotherapy    2018/finished 6 weeks of chemo in Sep 2018  . Personal history of radiation therapy    2018 left breast/finished radiation in Sept 2018 per pt.    Past Surgical History:  Procedure Laterality Date  . AXILLARY LYMPH NODE DISSECTION Left 08/05/2019   Procedure: LEFT AXILLARY LYMPH NODE DISSECTION;  Surgeon: CorErroll LunaD;  [facility];  Laterality: Left;  . BREAST LUMPECTOMY Left 05/05/2008  . BREAST LUMPECTOMY Left 12/07/2016   malignant  . BREAST LUMPECTOMY WITH RADIOACTIVE SEED AND SENTINEL LYMPH  NODE BIOPSY Left 12/07/2016   Procedure: LEFT BREAST LUMPECTOMY WITH RADIOACTIVE SEED AND SENTINEL LYMPH NODE BIOPSY;  Surgeon: ThoErroll LunaD;  [facility];  Laterality: Left;  . IR FLUORO GUIDE PORT INSERTION RIGHT  01/04/2017  . IR US [place]IDE VASC ACCESS RIGHT  01/04/2017  . PORT-A-CATH REMOVAL N/A 05/30/2017   Procedure: REMOVAL PORT-A-CATH;  Surgeon: CorErroll LunaD;  [facility];  Laterality: N/A;  . RE-EXCISION OF BREAST LUMPECTOMY Left 12/28/2016   Procedure: RE-EXCISION OF BREAST LUMPECTOMY;  Surgeon: ThoErroll LunaD;  [facility];  Laterality: Left;    There were no vitals filed for this visit.  Subjective Assessment - 10/15/19 1057    Subjective  Pt reports that last night she started to feel pain/aching in her LUE and took off her wrap. She states that she was trying to move her arm but the wrap continues to feel tight.    Pertinent History  12/07/2016: L lumpectomy with 2 lymph node removal,Grade III, ER+/PR-/HER2- returned and matted lymph node mass was removed on 08/05/19    Patient Stated Goals  I want to be able to move my arm more so that it won't get to where I can't use my arm.  Currently in Pain?  Yes    Pain Score  6     Pain Location  Arm    Pain Orientation  Left    Pain Descriptors / Indicators  Aching    Pain Type  Chronic pain    Pain Onset  More than a month ago    Pain Frequency  Constant    Aggravating Factors   nothing noted    Pain Relieving Factors  nothing noted            LYMPHEDEMA/ONCOLOGY QUESTIONNAIRE - 10/15/19 1101      Left Upper Extremity Lymphedema   15 cm Proximal to Olecranon Process  38.8 cm    10 cm Proximal to Olecranon Process  40 cm    Olecranon Process  29 cm    15 cm Proximal to Ulnar Styloid Process  29.2 cm    10 cm Proximal to Ulnar Styloid Process  26 cm    Just Proximal to Ulnar Styloid Process  17.5 cm     Across Hand at [employer]  18.8 cm    At [place] of 2nd Digit  6.3 cm           [facility]  Lymphedema Life Impact Scale Total Score  27.94 %                   PT Education - 10/15/19 1138    Education Details  Pt was educated to continue with exercises at home but to stop MLD until she speaks with her MD. Discussed treatment including on hold until MD able to discern whether she has possible infection or not. Pt will not put any compression on her arm and will notify her MD. Pt was educated on signs and symptoms of infection including redness, heat, tightness, increased edema. Pt was educated that she has had some reduction in circumferential measurements but due to rubor, pain and heat an infection needs to be ruled out before continueing with CDT. Pt was educated on reasons that CDT needs to be held in case of active infection in order to decrease risk of spread of infection or aggravating inflamed tissue.    Person(s) Educated  Patient    Methods  Explanation    Comprehension  Verbalized understanding       PT Short Term Goals - 09/16/19 1206      PT SHORT TERM GOAL #1   Title  Pt will be independent with initial HEP    Baseline  Pt currently does not have an HEP    Time  4    Period  Weeks    Status  New    Target Date  11/11/19        PT Long Term Goals - 09/16/19 1207      PT LONG TERM GOAL #1   Title  Pt will demonstrate 120 degrees L shoulder flexion/abduction within 4 weeks to demonstrate improved Functional ROM    Baseline  less than 90 degrees for both flexion and abduction of the LUE    Time  4    Period  Weeks    Status  New    Target Date  11/11/19      PT LONG TERM GOAL #2   Title  Pt will have 3 cm or greater circumferential measurement reduction in the brachium of the LUE in order to demonstrate reduction of fluid within 4 weeks.  Baseline  see measurements    Time   4    Period  Weeks    Status  New    Target Date  11/11/19      PT LONG TERM GOAL #3   Title  Pt will have a garment that she wears on a daily basis within 4 weeks in order to improve home management of lymphedema.    Baseline  Pt does not have a compression garment.    Time  4    Period  Weeks    Status  New    Target Date  11/11/19      PT LONG TERM GOAL #4   Title  Pt will improve LLIS score to 15% or less within 4 weeks to demonstrate improved quality of life with lymphedema.    Baseline  27%    Time  4    Period  Weeks    Status  New    Target Date  11/11/19      PT LONG TERM GOAL #5   Title  Pt will report pain 1/10 or less within 4 weeks in order to demonstrate improved quality of life.    Baseline  3/10    Time  4    Period  Weeks    Status  New    Target Date  11/11/19            Plan - 10/15/19 1056    Clinical Impression Statement  Pt presents with reduced circumferential measurements this session but continues with reports of pain in her LUE that requires early removal of wrap. This session pt presents with rubor in the LUE compared to the RUE and heat in the posterior antebrachium. Pt MD was notified via message and pt was encouraged to notify her MD in order to rule out cellulitis infection. Extra time was spent today educating pt on reasons for holding CDT if active infection is present and she was instructed to continue with exercises but discontinue MLD until infectoin can be ruled out. Short session today due to possibe infection. Pt will benefit from continued POC at this time. Possible short hold if infection is present until pt is able to finish medication if necessary.    Personal Factors and Comorbidities  Age;Comorbidity 2    Comorbidities  CKD, Hx radiation and lymph node removal    Rehab Potential  Good    PT Frequency  3x / week    PT Duration  4 weeks    PT Treatment/Interventions  Electrical Stimulation;Iontophoresis 68m/ml Dexamethasone;Moist  Heat;Functional mobility training;Therapeutic activities;Therapeutic exercise;Neuromuscular re-education;Patient/family education;Manual techniques    PT Next Visit Plan  Continue with wrapping and MLD, measure next session.    PT Home Exercise Plan  Access Code: [email or web address]   Consulted and Agree with Plan of Care  Patient       Patient will benefit from skilled therapeutic intervention in order to improve the following deficits and impairments:  Decreased range of motion, Pain, Increased edema, Postural dysfunction  Visit Diagnosis: Malignant neoplasm of upper-outer quadrant of left breast in female, estrogen receptor positive ([place]  Lymphedema of left arm     Problem List Patient Active Problem List   Diagnosis Date Noted  . Lymphedema of left arm 09/03/2019  . 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[place] 12/05/2018  . Grief 05/10/2018  . Anemia 01/09/2018  . Fatigue 03/14/2017  . Edema 03/14/2017  . Shortness of breath 03/14/2017  . Port catheter in place  01/18/2017  . Rectal bleeding 01/17/2017  . Constipation 01/17/2017  . Malignant neoplasm of upper-outer quadrant of left breast in female, estrogen receptor positive ([name]) 11/20/2016  . CKD (chronic kidney disease) stage 3, GFR 30-59 ml/min (HCC) 03/17/2016  . Osteopenia 01/05/2016  . 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 Seasonal allergies 12/01/2013  . Diastolic dysfunction 49/32/4199  . TOBACCO USE 04/13/2008  . Hyperlipidemia associated with type 2 diabetes mellitus ([place]) 11/29/2006  . OBESITY, BMI 30-35 11/29/2006  . Hypertension associated with diabetes ([place]) 11/29/2006    Ander Purpura, PT 10/15/2019, 11:43 AM  [place], [place], [address] Phone: [phone]   Fax:  [phone]  Name: Wendy [name] MRN:  [id number] Date of Birth: 11/22/1940

## 2019-10-16 ENCOUNTER — Ambulatory Visit (INDEPENDENT_AMBULATORY_CARE_PROVIDER_SITE_OTHER): Payer: Medicare Other | Admitting: Family Medicine

## 2019-10-16 ENCOUNTER — Encounter: Payer: Self-pay | Admitting: Family Medicine

## 2019-10-16 ENCOUNTER — Ambulatory Visit: Payer: 59 | Admitting: Family Medicine

## 2019-10-16 VITALS — BP 148/68 | HR 79 | Temp 98.0°F | Ht 62.0 in | Wt 201.6 lb

## 2019-10-16 DIAGNOSIS — I10 Essential (primary) hypertension: Secondary | ICD-10-CM

## 2019-10-16 DIAGNOSIS — E1165 Type 2 diabetes mellitus with hyperglycemia: Secondary | ICD-10-CM | POA: Diagnosis not present

## 2019-10-16 DIAGNOSIS — E113419 Type 2 diabetes mellitus with severe nonproliferative diabetic retinopathy with macular edema, unspecified eye: Secondary | ICD-10-CM

## 2019-10-16 DIAGNOSIS — L03114 Cellulitis of left upper limb: Secondary | ICD-10-CM | POA: Diagnosis not present

## 2019-10-16 DIAGNOSIS — E162 Hypoglycemia, unspecified: Secondary | ICD-10-CM

## 2019-10-16 DIAGNOSIS — Z794 Long term (current) use of insulin: Secondary | ICD-10-CM

## 2019-10-16 DIAGNOSIS — IMO0002 Reserved for concepts with insufficient information to code with codable children: Secondary | ICD-10-CM

## 2019-10-16 LAB — GLUCOSE, POCT (MANUAL RESULT ENTRY): POC Glucose: 90 mg/dl (ref 70–99)

## 2019-10-16 MED ORDER — CEPHALEXIN 500 MG PO CAPS: 500.0000 mg | ORAL_CAPSULE | Freq: Three times a day (TID) | ORAL | 0 refills | Status: DC

## 2019-10-16 MED ORDER — SPIRONOLACTONE 25 MG PO TABS: 12.5000 mg | ORAL_TABLET | Freq: Every day | ORAL | 3 refills | Status: DC

## 2019-10-16 MED ORDER — BUSPIRONE HCL 10 MG PO TABS: 10.0000 mg | ORAL_TABLET | Freq: Three times a day (TID) | ORAL | 1 refills | Status: AC

## 2019-10-16 NOTE — Progress Notes (Signed)
Phone [phone] In person visit   Subjective:   Wendy [name] is a 79 y.o. year old very pleasant female patient who presents for/with See problem oriented charting Chief Complaint  Patient presents with  . Arm swelling    This visit occurred during the SARS-CoV-2 public health emergency.  Safety protocols were in place, including screening questions prior to the visit, additional usage of staff PPE, and extensive cleaning of exam room while observing appropriate contact time as indicated for disinfecting solutions.   Past Medical History-  Patient Active Problem List   Diagnosis Date Noted  . Shortness of breath 03/14/2017    Priority: High  . Malignant neoplasm of upper-outer quadrant of left breast in female, estrogen receptor positive ([place]) 11/20/2016    Priority: High  . 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 Diastolic dysfunction 35/46/5681    Priority: High  . Lymphedema of left arm 09/03/2019    Priority: Medium  . 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[place]) 12/05/2018    Priority: Medium  . Rectal bleeding 01/17/2017    Priority: Medium  . CKD (chronic kidney disease) stage 3, GFR 30-59 ml/min ([facility]) 03/17/2016    Priority: Medium  . Osteopenia 01/05/2016    Priority: Medium  . TOBACCO USE 04/13/2008    Priority: Medium  . Hyperlipidemia associated with type 2 diabetes mellitus ([place]) 11/29/2006    Priority: Medium  . OBESITY, BMI 30-35 11/29/2006    Priority: Medium  . Hypertension associated with diabetes ([place]) 11/29/2006    Priority: Medium  . Fatigue 03/14/2017    Priority: Low  . Edema 03/14/2017    Priority: Low  . Port catheter in place 01/18/2017    Priority: Low  . Constipation 01/17/2017    Priority: Low  . Seasonal allergies 12/01/2013    Priority: Low  . Grief 05/10/2018  . Anemia 01/09/2018    Medications-  reviewed and updated Current Outpatient Medications  Medication Sig Dispense Refill  . amLODipine (NORVASC) 10 MG tablet TAKE 1 TABLET BY MOUTH EVERY DAY 90 tablet 1  . busPIRone (BUSPAR) 10 MG tablet Take 1 tablet (10 mg total) by mouth 3 (three) times daily. 270 tablet 1  . carvedilol (COREG) 25 MG tablet Take 1 tablet (25 mg total) by mouth 2 (two) times daily with a meal. 180 tablet 3  . cloNIDine (CATAPRES) 0.3 MG tablet TAKE 1 TABLET (0.3 MG TOTAL) BY MOUTH 2 (TWO) TIMES DAILY. 180 tablet 1  . exemestane (AROMASIN) 25 MG tablet Take 1 tablet (25 mg total) by mouth daily after breakfast. 30 tablet 2  . HYDROcodone-acetaminophen (NORCO/VICODIN) 5-325 MG tablet Take 1 tablet by mouth every 8 (eight) hours as needed for moderate pain. 15 tablet 0  . Insulin Glargine (LANTUS SOLOSTAR) 100 UNIT/ML Solostar Pen Inject 28 units into the skin in am 10 pen 3  . Insulin Pen Needle 31G X 5 MM MISC Use pen needles for insulin injection daily 100 each 3  . insulin regular (NOVOLIN R) 100 units/mL injection Inject 0.14-0.18 mLs (14-18 Units total) into the skin 3 (three) times daily before meals. Relion. Please provide insulin syringes needed 20 mL 11  . Insulin Syringe-Needle U-100 (B-D INS SYRINGE 0.5CC/30GX1/2") 30G X 1/2" 0.5 ML MISC USE AS DIRECTED 4 TIMES A DAY 300 each 3  . letrozole (FEMARA) 2.5 MG tablet TAKE 1 TABLET BY MOUTH EVERY  DAY 90 tablet 1  . lisinopril-hydrochlorothiazide (ZESTORETIC) 20-12.5 MG tablet Take 1 tablet by mouth daily. 90 tablet 3  . lovastatin (MEVACOR) 40 MG tablet TAKE 1 TABLET BY MOUTH EVERYDAY AT BEDTIME 90 tablet 3  . metFORMIN (GLUCOPHAGE) 500 MG tablet TAKE 2 TABLETS BY MOUTH DAILY WITH SUPPER. 180 tablet 2  . OVER THE COUNTER MEDICATION Take 1 tablet by mouth daily as needed. Stool softner for constipation, also drinks warm prune juice prn    . polyethylene glycol powder (GLYCOLAX/MIRALAX) 17 GM/SCOOP powder Take 17 g by mouth daily. 1 g 0  . traMADol (ULTRAM) 50 MG  tablet TAKE 1 TABLET BY MOUTH EVERY 6 HOURS AS NEEDED FOR MODERATE/SEVERE PAIN (NECK AND SHOULDER PAIN). 20 tablet 0  . traZODone (DESYREL) 50 MG tablet Take 0.5-1 tablets (25-50 mg total) by mouth at bedtime as needed for sleep. 30 tablet 3  . aspirin 81 MG tablet Take 81 mg by mouth daily.      . B-D INS SYRINGE 0.5CC/31GX5/16 31G X 5/16" 0.5 ML MISC USE AS DIRECTED 4 TIMES A DAY (Patient not taking: Reported on 10/16/2019) 300 each 1  . cephALEXin (KEFLEX) 500 MG capsule Take 1 capsule (500 mg total) by mouth 3 (three) times daily for 7 days. 21 capsule 0  . furosemide (LASIX) 20 MG tablet TAKE 1 TABLET BY MOUTH EVERY DAY AS NEEDED (Patient not taking: Reported on 10/07/2019) 30 tablet 0  . spironolactone (ALDACTONE) 25 MG tablet Take 0.5 tablets (12.5 mg total) by mouth daily. 15 tablet 3   No current facility-administered medications for this visit.     Objective:  BP (!) 148/68   Pulse 79   Temp 98 F (36.7 C) (Temporal)   Ht 5\' 2"  (1.575 m)   Wt 201 lb 9.6 oz (91.4 kg)   LMP  (LMP Unknown)   SpO2 97%   BMI 36.87 kg/m  Gen: NAD, resting comfortably CV: RRR no murmurs rubs or gallops Lungs: CTAB no crackles, wheeze, rhonchi Ext: 1+ edema bilateral. Left upper arm edematous- near antecubital fossa and into inner arm there is erythema and warmth and some tenderness.     Assessment and Plan  # Swelling in arm  S: Patient noticed when she was at rehab on yesterday, she had a wrap very tight on her arm that she feels was the start for the swelling. The therapist working with her messaged [name] concerned about the swelling and redness- she also thought area was warm.   Today we noted swelling and warmth in inner lower portion of left upper arm. No fever or chills.  A/P: I am concerned for cellulitis.  Patient states not improving despite wrap not being in place anymore.  We will cover with Keflex 500 mg 3 times a day-if symptoms worsen patient will seek care immediately.  If symptoms are  stable and not improving we can recheck next week.  #hypertension S: compliant with amlodipine 10 mg, carvedilol 25mg  BID, lisinopril hctz 20-12.5 mg and , and clonidine 0.3 mg BID BP Readings from Last 3 Encounters:  10/16/19 (!) 148/68  10/07/19 (!) 158/68  09/15/19 (!) 164/70  A/P: Improved control back on carvedilol but blood pressure still elevated.  Blood work-up was unrevealing for causes of resistant hypertension.  Will get duplex of renal arteries.  Patient does not think setting up 24-hour urine collection and return of the urine is possible right now. -Continueamlodipine 10 mg, carvedilol 25mg  BID, lisinopril hctz 20-12.5 mg and , and  clonidine 0.3 mg BID -GFR worsened on Lasix but still has for as needed use -We will see if she can tolerate spironolactone 12.5 mg which may help with her concerns about edema and shortness of breath-echocardiogram November 2019 without evidence of LV dysfunction or diastolic dysfunction-also had low risk stress test December 2019.  Recheck potassium early next week and then see me in 2 weeks for blood pressure and potassium recheck.  Some of shortness of breath may be related to sedentary activity -No recent significant weight gain since last visit-up slightly 2 pounds in the last month.  She reports she is already watching her salt intake  #Hyperglycemia-patient with history of diabetes followed by endocrinology.  She skipped lunch today-I discouraged her from doing this in the future.  Her blood sugar when we checked it was up to 90 after she had a piece of candy.  We gave her some crackers and she felt significantly better-encouraged her not to skip meals in the future  Recommended follow up: 2-week blood pressure and potassium recheck Future Appointments  Date Time Provider [place]  10/17/2019 11:00 AM Ander Purpura, PT [facility] None  10/20/2019 11:00 AM Ander Purpura, PT [facility] None  10/22/2019 11:00 AM Ander Purpura, PT  [facility] None  10/23/2019  3:00 PM Marin Olp, MD [facility]  10/24/2019 11:00 AM Ander Purpura, PT [facility] None  10/27/2019 11:00 AM Ander Purpura, PT [facility] None  10/29/2019 11:00 AM Ander Purpura, PT [facility] None  10/31/2019 11:00 AM Ander Purpura, PT [facility] None  11/03/2019 11:00 AM Ander Purpura, PT [facility] None  11/13/2019  1:40 PM Marin Olp, MD [facility]  11/18/2019  2:15 PM Philemon Kingdom, MD [facility] None  03/11/2020  1:30 PM [facility] 1 [facility] None  03/11/2020  2:00 PM [name], MD [facility] None    Lab/Order associations:   ICD-10-CM   1. Cellulitis of left upper extremity  L03.114   2. Resistant hypertension  I10 VAS US RENAL ARTERY DUPLEX    BMET future  3. Hypoglycemia  E16.2 POCT Glucose (CBG)  4. 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 busPIRone (BUSPAR) 10 MG tablet    Sig: Take 1 tablet (10 mg total) by mouth 3 (three) times daily.    Dispense:  270 tablet    Refill:  1    Apply GoodRx Member ID [id number] BIN [id number] PCN [id number] Group [id number]  . spironolactone (ALDACTONE) 25 MG tablet    Sig: Take 0.5 tablets (12.5 mg total) by mouth daily.    Dispense:  15 tablet    Refill:  3  . cephALEXin (KEFLEX) 500 MG capsule    Sig: Take 1 capsule (500 mg total) by mouth 3 (three) times daily for 7 days.    Dispense:  21 capsule    Refill:  0   Return precautions advised.  Garret Reddish, MD

## 2019-10-16 NOTE — Patient Instructions (Addendum)
Blood pressure still high. Please start spironolactone 12.5 mg . Come back Monday or Tuesday of next week to recheck potassium and make sure not too high . This medicine should also help slightly with your swellin gissue.   We will call you within two weeks about your referral for ultrasound of arteries going to kidneys. If you do not hear within 3 weeks, give [name] a call.   For infection on arm- start antibiotic keflex 3x a day for 7 days. If worsens- seek care in emergency room over weekend but I think you will get better in next 48 hours. See [name] back if not worsening but fails to improve.   Schedule a visit next week for repeat kidney function and potassium check  Recommended follow up:  2 weeks in person repeat blood pressure and potassium- schedule before you leave

## 2019-10-21 ENCOUNTER — Encounter: Payer: Self-pay | Admitting: Family Medicine

## 2019-10-21 ENCOUNTER — Ambulatory Visit (INDEPENDENT_AMBULATORY_CARE_PROVIDER_SITE_OTHER): Payer: Medicare Other | Admitting: Family Medicine

## 2019-10-21 ENCOUNTER — Other Ambulatory Visit: Payer: 59

## 2019-10-21 VITALS — BP 167/83 | HR 80 | Ht 62.0 in | Wt 201.0 lb

## 2019-10-21 DIAGNOSIS — N183 Chronic kidney disease, stage 3 unspecified: Secondary | ICD-10-CM | POA: Diagnosis not present

## 2019-10-21 DIAGNOSIS — E1159 Type 2 diabetes mellitus with other circulatory complications: Secondary | ICD-10-CM

## 2019-10-21 DIAGNOSIS — R0602 Shortness of breath: Secondary | ICD-10-CM

## 2019-10-21 DIAGNOSIS — I10 Essential (primary) hypertension: Secondary | ICD-10-CM

## 2019-10-21 DIAGNOSIS — I5189 Other ill-defined heart diseases: Secondary | ICD-10-CM | POA: Diagnosis not present

## 2019-10-21 MED ORDER — FLUTICASONE PROPIONATE 50 MCG/ACT NA SUSP: 2.0000 | Freq: Every day | NASAL | 3 refills | Status: DC

## 2019-10-21 MED ORDER — ALBUTEROL SULFATE HFA 108 (90 BASE) MCG/ACT IN AERS: 2.0000 | INHALATION_SPRAY | Freq: Four times a day (QID) | RESPIRATORY_TRACT | 2 refills | Status: DC | PRN

## 2019-10-21 NOTE — Patient Instructions (Signed)
Health Maintenance Due  Topic Date Due  . FOOT EXAM will address when in office next  10/16/2019    Depression screen [facility] 2/9 09/03/2019 07/24/2019 04/07/2019  Decreased Interest 0 0 0  Down, Depressed, Hopeless 0 0 1  PHQ - 2 Score 0 0 1  Altered sleeping 0 3 3  Tired, decreased energy 0 1 2  Change in appetite 0 0 0  Feeling bad or failure about yourself  0 0 2  Trouble concentrating 0 0 0  Moving slowly or fidgety/restless 0 0 0  Suicidal thoughts 0 0 -  PHQ-9 Score 0 4 8  Difficult doing work/chores Not difficult at all Not difficult at all Not difficult at all  Some recent data might be hidden    Recommended follow up: No follow-ups on file.

## 2019-10-21 NOTE — Progress Notes (Signed)
Phone [phone] Virtual visit via Video note   Subjective:  Chief complaint: Chief Complaint  Patient presents with  . Shortness of Breath    x 1 months     This visit type was conducted due to national recommendations for restrictions regarding the COVID-19 Pandemic (e.g. social distancing).  This format is felt to be most appropriate for this patient at this time balancing risks to patient and risks to population by having him in for in person visit.  No physical exam was performed (except for noted visual exam or audio findings with Telehealth visits).    Our team/I connected with [name] at  4:00 PM EST by a video enabled telemedicine application ([email or web address] or caregility through epic) and verified that I am speaking with the correct person using two identifiers.  Location patient: [place] Location provider: [place], office Persons participating in the virtual visit:  patient  Our team/I discussed the limitations of evaluation and management by telemedicine and the availability of in person appointments. In light of current covid-19 pandemic, patient also understands that we are trying to protect them by minimizing in office contact if at all possible.  The patient expressed consent for telemedicine visit and agreed to proceed. Patient understands insurance will be billed.   Past Medical History-  Patient Active Problem List   Diagnosis Date Noted  . Shortness of breath 03/14/2017    Priority: High  . Malignant neoplasm of upper-outer quadrant of left breast in female, estrogen receptor positive ([place]) 11/20/2016    Priority: High  . 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 Diastolic dysfunction 76/72/0947    Priority: High  . Lymphedema of left arm 09/03/2019    Priority: Medium  . 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[place]) 12/05/2018   Priority: Medium  . Rectal bleeding 01/17/2017    Priority: Medium  . CKD (chronic kidney disease) stage 3, GFR 30-59 ml/min (H[facility]) 03/17/2016    Priority: Medium  . Osteopenia 01/05/2016    Priority: Medium  . TOBACCO USE 04/13/2008    Priority: Medium  . Hyperlipidemia associated with type 2 diabetes mellitus ([place]) 11/29/2006    Priority: Medium  . OBESITY, BMI 30-35 11/29/2006    Priority: Medium  . Hypertension associated with diabetes ([place]) 11/29/2006    Priority: Medium  . Fatigue 03/14/2017    Priority: Low  . Edema 03/14/2017    Priority: Low  . Port catheter in place 01/18/2017    Priority: Low  . Constipation 01/17/2017    Priority: Low  . Seasonal allergies 12/01/2013    Priority: Low  . Grief 05/10/2018  . Anemia 01/09/2018    Medications- reviewed and updated Current Outpatient Medications  Medication Sig Dispense Refill  . amLODipine (NORVASC) 10 MG tablet TAKE 1 TABLET BY MOUTH EVERY DAY 90 tablet 1  . aspirin 81 MG tablet Take 81 mg by mouth daily.      . B-D INS SYRINGE 0.5CC/31GX5/16 31G X 5/16" 0.5 ML MISC USE AS DIRECTED 4 TIMES A DAY 300 each 1  . busPIRone (BUSPAR) 10 MG tablet Take 1 tablet (10 mg total) by mouth 3 (three) times daily. 270 tablet 1  . carvedilol (COREG) 25 MG tablet Take 1 tablet (25 mg total) by mouth 2 (two) times daily with a meal. 180 tablet 3  . cloNIDine (CATAPRES) 0.3 MG tablet TAKE 1 TABLET (0.3  MG TOTAL) BY MOUTH 2 (TWO) TIMES DAILY. 180 tablet 1  . exemestane (AROMASIN) 25 MG tablet Take 1 tablet (25 mg total) by mouth daily after breakfast. 30 tablet 2  . HYDROcodone-acetaminophen (NORCO/VICODIN) 5-325 MG tablet Take 1 tablet by mouth every 8 (eight) hours as needed for moderate pain. 15 tablet 0  . Insulin Glargine (LANTUS SOLOSTAR) 100 UNIT/ML Solostar Pen Inject 28 units into the skin in am 10 pen 3  . Insulin Pen Needle 31G X 5 MM MISC Use pen needles for insulin injection daily 100 each 3  . insulin regular (NOVOLIN R)  100 units/mL injection Inject 0.14-0.18 mLs (14-18 Units total) into the skin 3 (three) times daily before meals. Relion. Please provide insulin syringes needed 20 mL 11  . Insulin Syringe-Needle U-100 (B-D INS SYRINGE 0.5CC/30GX1/2") 30G X 1/2" 0.5 ML MISC USE AS DIRECTED 4 TIMES A DAY 300 each 3  . letrozole (FEMARA) 2.5 MG tablet TAKE 1 TABLET BY MOUTH EVERY DAY 90 tablet 1  . lisinopril-hydrochlorothiazide (ZESTORETIC) 20-12.5 MG tablet Take 1 tablet by mouth daily. 90 tablet 3  . lovastatin (MEVACOR) 40 MG tablet TAKE 1 TABLET BY MOUTH EVERYDAY AT BEDTIME 90 tablet 3  . metFORMIN (GLUCOPHAGE) 500 MG tablet TAKE 2 TABLETS BY MOUTH DAILY WITH SUPPER. 180 tablet 2  . OVER THE COUNTER MEDICATION Take 1 tablet by mouth daily as needed. Stool softner for constipation, also drinks warm prune juice prn    . polyethylene glycol powder (GLYCOLAX/MIRALAX) 17 GM/SCOOP powder Take 17 g by mouth daily. 1 g 0  . spironolactone (ALDACTONE) 25 MG tablet Take 0.5 tablets (12.5 mg total) by mouth daily. 15 tablet 3  . traMADol (ULTRAM) 50 MG tablet TAKE 1 TABLET BY MOUTH EVERY 6 HOURS AS NEEDED FOR MODERATE/SEVERE PAIN (NECK AND SHOULDER PAIN). 20 tablet 0  . traZODone (DESYREL) 50 MG tablet Take 0.5-1 tablets (25-50 mg total) by mouth at bedtime as needed for sleep. 30 tablet 3  . albuterol (VENTOLIN HFA) 108 (90 Base) MCG/ACT inhaler Inhale 2 puffs into the lungs every 6 (six) hours as needed for wheezing or shortness of breath. 18 g 2  . fluticasone (FLONASE) 50 MCG/ACT nasal spray Place 2 sprays into both nostrils daily. 16 g 3  . furosemide (LASIX) 20 MG tablet TAKE 1 TABLET BY MOUTH EVERY DAY AS NEEDED (Patient not taking: Reported on 10/07/2019) 30 tablet 0   No current facility-administered medications for this visit.     Objective:  BP (!) 167/83   Pulse 80   Ht 5\' 2"  (1.575 m)   Wt 201 lb (91.2 kg)   LMP  (LMP Unknown)   BMI 36.76 kg/m  self reported vitals Gen: NAD, resting comfortably Lungs:  nonlabored, normal respiratory rate  Skin: appears dry, no obvious rash Tearful when discussing multiple medical issues she has had over the last year.     Assessment and Plan   #Shortness of breath S:On going for over a month. Patient has noticed that in the last few days it has gotten slightly worse. She has SOB with just walking around the house. Feels like she has a lot of fluid on body. She has some swelling around ankles.  Continued issues with left arm lymphedema.  She has not checked her weight. She has had cough at times that is not productive.  She reports Some clear phlegm. Some congestion in throat. Albuterol helped in pastshe has had negative covid test in October she did not have  in December when ordered. No chest pain.   Patient was seen by cardiology last year for shortness of breath but that seems to have continued to slightly worsen. Echocardiogram 08/12/18 before referral with EF 55% without significant valvular issues or diastolic dysfunction (prior 2011 echocardiogram with grade 2 diastolic dysfunction). Cardiology did CTA to rule out PE. At last visit 10/11/2018 with cardiology SOB reportedly improved after treatment of breast abscess (main abnormal finding on CTA). She also had a stress test done 12/19 which was considered low risk. Appears PFTs were done as well but not interpreted (I previously did not recognize these were done)  In December 2020 patient felt like issues with breathing were worsening. We tried a course of lasix but this produced strain on kidneys and did not significantly relieve symptoms (had diastolic dysfunction noted 2011 but not in 2019). 5 day trial was done. Would like to reduce amlodipine but with BP elevations have not felt able. Had recommended covid testing in December which she did not repeat- but with chronicity doubt this is the cause.   CXR 09/15/2019 with "mild stbale bilateral interstitial prominence consistent with mild chronic interstitial  lung disease". We referred to pulmonology at that point but she has not been able to set up appointment yet A/P: Ongoing shortness of breath. With history of breast cancer - will get CT angiogram again this year to rule out PE but also to investigate interstitial lung disease before pulmonology visit- ordered today   Patient has also found albuterol helpful in past with SOB and requests refill- done today. Has had some allergy like symptoms with nasal congestion- will trial flonase.    #hypertension S: compliant with amlodipine 10 mg, coreg 25 mg BID, lisinopril hctz 20-12.5 mg, clonidine 0.3 mg BID. We tried to start spironolactone last visit but she has not yet started BP Readings from Last 3 Encounters:  10/21/19 (!) 167/83  10/16/19 (!) 148/68  10/07/19 (!) 158/68  A/P: poor control- reinforced spironolactone today. Workup for resistant hypertension ongoing- still need renal duplex to be completed. She would have issues with 24 hour urine- initial bloodwork unrevealing.  - I think spironolactone may also help perception of swelling.   # CKD III S:GFR stable in mid 40s to 61s for the most part. Should avoid nsaids  A/P: hopefully stable- will need to watch this closely as we add spirinolactone as well as monitor potassium closely- we have future bmp on file and labs on 11/07/2019 to monitor closely  Recommended follow up:  Would like to see pulmonary visit set up first if possible- needs 2 week BP recheck given elevated BP Future Appointments  Date Time Provider [place]  10/24/2019 11:00 AM Ander Purpura, PT [facility] None  10/27/2019 11:00 AM Ander Purpura, PT [facility] None  10/28/2019 11:00 AM [facility] 3 [facility]  10/29/2019 11:00 AM Ander Purpura, PT [facility] None  10/31/2019 11:00 AM Ander Purpura, PT [facility] None  11/03/2019 11:00 AM Ander Purpura, PT [facility] None  11/07/2019  2:15 PM [facility]  11/07/2019  2:40 PM Marin Olp,  MD [facility]  11/13/2019  1:40 PM Marin Olp, MD [facility]  11/18/2019  2:20 PM Philemon Kingdom, MD [facility] None  03/11/2020  1:30 PM [facility] 1 [facility] None  03/11/2020  2:00 PM [name], MD [facility] None    Lab/Order associations:   ICD-10-CM   1. Hypertension associated with diabetes ([place])  E11.59  I10   2. Shortness of breath  R06.02 CT Angio Chest W/Cm &/Or Wo Cm  3. Diastolic dysfunction  F29.24   4. Stage 3 chronic kidney disease, unspecified whether stage 3a or 3b CKD  N18.30     Meds ordered this encounter  Medications  . albuterol (VENTOLIN HFA) 108 (90 Base) MCG/ACT inhaler    Sig: Inhale 2 puffs into the lungs every 6 (six) hours as needed for wheezing or shortness of breath.    Dispense:  18 g    Refill:  2  . fluticasone (FLONASE) 50 MCG/ACT nasal spray    Sig: Place 2 sprays into both nostrils daily.    Dispense:  16 g    Refill:  3   Return precautions advised.  Garret Reddish, MD

## 2019-10-23 ENCOUNTER — Ambulatory Visit: Payer: 59 | Admitting: Family Medicine

## 2019-10-23 ENCOUNTER — Inpatient Hospital Stay (HOSPITAL_COMMUNITY): Admission: RE | Admit: 2019-10-23 | Payer: 59 | Source: Ambulatory Visit

## 2019-10-23 NOTE — Addendum Note (Signed)
Addended by: Marin Olp on: 10/23/2019 08:38 AM   Modules accepted: Orders

## 2019-10-24 ENCOUNTER — Telehealth: Payer: Self-pay

## 2019-10-24 ENCOUNTER — Ambulatory Visit: Payer: Medicare Other

## 2019-10-24 NOTE — Telephone Encounter (Signed)
Spoke with patient. She reports that her arm is doing much better and that she will be at her appointment on Monday at [place] [facility]

## 2019-10-28 ENCOUNTER — Ambulatory Visit (HOSPITAL_COMMUNITY): Payer: 59

## 2019-10-28 ENCOUNTER — Telehealth: Payer: Self-pay | Admitting: *Deleted

## 2019-10-28 NOTE — Telephone Encounter (Signed)
A message was left on her voicemail,to give [name] a call.

## 2019-10-30 ENCOUNTER — Encounter: Payer: Self-pay | Admitting: Pulmonary Disease

## 2019-10-30 ENCOUNTER — Other Ambulatory Visit: Payer: Self-pay

## 2019-10-30 ENCOUNTER — Other Ambulatory Visit: Payer: Self-pay | Admitting: Family Medicine

## 2019-10-30 ENCOUNTER — Ambulatory Visit (INDEPENDENT_AMBULATORY_CARE_PROVIDER_SITE_OTHER): Payer: Medicare Other | Admitting: Pulmonary Disease

## 2019-10-30 DIAGNOSIS — R0602 Shortness of breath: Secondary | ICD-10-CM

## 2019-10-30 MED ORDER — BREO ELLIPTA 200-25 MCG/INH IN AEPB: 1.0000 | INHALATION_SPRAY | Freq: Every day | RESPIRATORY_TRACT | 0 refills | Status: DC

## 2019-10-30 MED ORDER — BREO ELLIPTA 200-25 MCG/INH IN AEPB: 1.0000 | INHALATION_SPRAY | Freq: Every day | RESPIRATORY_TRACT | 3 refills | Status: DC

## 2019-10-30 NOTE — Progress Notes (Signed)
Wendy [name]    [id number]    04/29/1941  Primary Care Physician:Hunter, Brayton Mars, MD  Referring Physician: Marin Olp, MD [place],  [place] [address]  Chief complaint: Consult for dyspnea  HPI: 79 year old with history of breast cancer, hypertension, CKD, hyperlipidemia, diastolic heart failure Referred for evaluation of dyspnea.  She has increasing symptoms over the past 2 months with dyspnea on exertion and at rest.  No cough, sputum production or wheezing.  History of left breast cancer with recurrence in September 2020 with lymph node dissection.  Follows with Dr. Burr Medico, oncology.  I have reviewed the primary care and specialist notes.  Pets: No pets Occupation: Used to run a daycare.  Currently on disability Exposures: Had significant mold exposure in her bathroom which was fixed recently.  No hot tub, Jacuzzi, down pillows or comforter Smoking history: Never smoker Travel history: No significant travel Relevant family history: No significant family history of lung disease  Outpatient Encounter Medications as of 10/30/2019  Medication Sig  . albuterol (VENTOLIN HFA) 108 (90 Base) MCG/ACT inhaler Inhale 2 puffs into the lungs every 6 (six) hours as needed for wheezing or shortness of breath.  Marland Kitchen amLODipine (NORVASC) 10 MG tablet TAKE 1 TABLET BY MOUTH EVERY DAY  . aspirin 81 MG tablet Take 81 mg by mouth daily.    . B-D INS SYRINGE 0.5CC/31GX5/16 31G X 5/16" 0.5 ML MISC USE AS DIRECTED 4 TIMES A DAY  . busPIRone (BUSPAR) 10 MG tablet Take 1 tablet (10 mg total) by mouth 3 (three) times daily.  . carvedilol (COREG) 25 MG tablet Take 1 tablet (25 mg total) by mouth 2 (two) times daily with a meal.  . cloNIDine (CATAPRES) 0.3 MG tablet TAKE 1 TABLET (0.3 MG TOTAL) BY MOUTH 2 (TWO) TIMES DAILY.  Marland Kitchen exemestane (AROMASIN) 25 MG tablet Take 1 tablet (25 mg total) by mouth daily after breakfast.  . fluticasone (FLONASE) 50 MCG/ACT nasal spray Place 2  sprays into both nostrils daily.  . furosemide (LASIX) 20 MG tablet TAKE 1 TABLET BY MOUTH EVERY DAY AS NEEDED  . HYDROcodone-acetaminophen (NORCO/VICODIN) 5-325 MG tablet Take 1 tablet by mouth every 8 (eight) hours as needed for moderate pain.  . Insulin Glargine (LANTUS SOLOSTAR) 100 UNIT/ML Solostar Pen Inject 28 units into the skin in am  . Insulin Pen Needle 31G X 5 MM MISC Use pen needles for insulin injection daily  . insulin regular (NOVOLIN R) 100 units/mL injection Inject 0.14-0.18 mLs (14-18 Units total) into the skin 3 (three) times daily before meals. Relion. Please provide insulin syringes needed  . Insulin Syringe-Needle U-100 (B-D INS SYRINGE 0.5CC/30GX1/2") 30G X 1/2" 0.5 ML MISC USE AS DIRECTED 4 TIMES A DAY  . letrozole (FEMARA) 2.5 MG tablet TAKE 1 TABLET BY MOUTH EVERY DAY  . lisinopril-hydrochlorothiazide (ZESTORETIC) 20-12.5 MG tablet Take 1 tablet by mouth daily.  Marland Kitchen lovastatin (MEVACOR) 40 MG tablet TAKE 1 TABLET BY MOUTH EVERYDAY AT BEDTIME  . metFORMIN (GLUCOPHAGE) 500 MG tablet TAKE 2 TABLETS BY MOUTH DAILY WITH SUPPER.  Marland Kitchen OVER THE COUNTER MEDICATION Take 1 tablet by mouth daily as needed. Stool softner for constipation, also drinks warm prune juice prn  . polyethylene glycol powder (GLYCOLAX/MIRALAX) 17 GM/SCOOP powder Take 17 g by mouth daily.  Marland Kitchen spironolactone (ALDACTONE) 25 MG tablet Take 0.5 tablets (12.5 mg total) by mouth daily.  . traMADol (ULTRAM) 50 MG tablet TAKE 1 TABLET BY  MOUTH EVERY 6 HOURS AS NEEDED FOR MODERATE/SEVERE PAIN (NECK AND SHOULDER PAIN).  Marland Kitchen traZODone (DESYREL) 50 MG tablet Take 0.5-1 tablets (25-50 mg total) by mouth at bedtime as needed for sleep.   No facility-administered encounter medications on file as of 10/30/2019.    Allergies as of 10/30/2019  . (No Known Allergies)    Past Medical History:  Diagnosis Date  . Abnormal breast finding 2018   per pt/ having  a lot drainage from left breast nipple  . Anemia   . Anxiety   .  Breast cancer ([place]) 11/2016   left/  . CKD (chronic kidney disease)   . Depression   . GERD (gastroesophageal reflux disease)    TUMS as needed  . HTN (hypertension)    states BP has been high recently; has been on med. x 20 yr.  . Hyperlipidemia   . Hyperplastic colon polyp   . HYPERTENSION, BENIGN SYSTEMIC 11/29/2006   Lisinopril hctz 10-12.5mg , amlodipine 10mg , coreg 25mg  BID, clonidine 0.3 mg BID.   Marland Kitchen Insulin dependent diabetes mellitus   . Personal history of chemotherapy    2018/finished 6 weeks of chemo in Sep 2018  . Personal history of radiation therapy    2018 left breast/finished radiation in Sept 2018 per pt.    Past Surgical History:  Procedure Laterality Date  . AXILLARY LYMPH NODE DISSECTION Left 08/05/2019   Procedure: LEFT AXILLARY LYMPH NODE DISSECTION;  Surgeon: Erroll Luna, MD;  Location: [facility];  Service: General;  Laterality: Left;  . BREAST LUMPECTOMY Left 05/05/2008  . BREAST LUMPECTOMY Left 12/07/2016   malignant  . BREAST LUMPECTOMY WITH RADIOACTIVE SEED AND SENTINEL LYMPH NODE BIOPSY Left 12/07/2016   Procedure: LEFT BREAST LUMPECTOMY WITH RADIOACTIVE SEED AND SENTINEL LYMPH NODE BIOPSY;  Surgeon: Erroll Luna, MD;  Location: [facility];  Service: General;  Laterality: Left;  . IR FLUORO GUIDE PORT INSERTION RIGHT  01/04/2017  . IR US GUIDE VASC ACCESS RIGHT  01/04/2017  . PORT-A-CATH REMOVAL N/A 05/30/2017   Procedure: REMOVAL PORT-A-CATH;  Surgeon: Erroll Luna, MD;  Location: [facility];  Service: General;  Laterality: N/A;  . RE-EXCISION OF BREAST LUMPECTOMY Left 12/28/2016   Procedure: RE-EXCISION OF BREAST LUMPECTOMY;  Surgeon: Erroll Luna, MD;  Location: [facility];  Service: General;  Laterality: Left;    Family History  Problem Relation Age of Onset  . Diabetes Sister        x 4  . Heart failure Sister   . Diabetes Brother   . Heart disease Brother   . Diabetes Paternal Grandmother   .  Diabetes Paternal Aunt   . Heart attack Brother   . Arthritis Sister   . Hypertension Sister   . Diabetes Sister   . Emphysema Brother   . Cancer Father 45       brain tumor   . Diabetes Child   . Asthma Child   . Hypertension Child   . Diabetes Child   . Diabetes Child   . Colon cancer Neg Hx   . Stomach cancer Neg Hx     Social History   Socioeconomic History  . Marital status: Married    Spouse name: Not on file  . Number of children: 4  . Years of education: Not on file  . Highest education level: Not on file  Occupational History  . Occupation: DAY Armed forces operational officer: [employer]  Tobacco Use  . Smoking  status: Never Smoker  . Smokeless tobacco: Former Systems developer    Types: Snuff  Substance and Sexual Activity  . Alcohol use: No    Alcohol/week: 0.0 standard drinks  . Drug use: No  . Sexual activity: Not Currently    Partners: Male  Other Topics Concern  . Not on file  Social History Narrative  . Not on file   Social Determinants of Health   Financial Resource Strain:   . Difficulty of Paying Living Expenses: Not on file  Food Insecurity:   . Worried About Charity fundraiser in the Last Year: Not on file  . Ran Out of Food in the Last Year: Not on file  Transportation Needs:   . Lack of Transportation (Medical): Not on file  . Lack of Transportation (Non-Medical): Not on file  Physical Activity:   . Days of Exercise per Week: Not on file  . Minutes of Exercise per Session: Not on file  Stress:   . Feeling of Stress : Not on file  Social Connections:   . Frequency of Communication with Friends and Family: Not on file  . Frequency of Social Gatherings with Friends and Family: Not on file  . Attends Religious Services: Not on file  . Active Member of Clubs or Organizations: Not on file  . Attends Archivist Meetings: Not on file  . Marital Status: Not on file  Intimate Partner Violence:   . Fear of Current or Ex-Partner: Not on file  .  Emotionally Abused: Not on file  . Physically Abused: Not on file  . Sexually Abused: Not on file    Review of systems: Review of Systems  Constitutional: Negative for fever and chills.  HENT: Negative.   Eyes: Negative for blurred vision.  Respiratory: as per HPI  Cardiovascular: Negative for chest pain and palpitations.  Gastrointestinal: Negative for vomiting, diarrhea, blood per rectum. Genitourinary: Negative for dysuria, urgency, frequency and hematuria.  Musculoskeletal: Negative for myalgias, back pain and joint pain.  Skin: Negative for itching and rash.  Neurological: Negative for dizziness, tremors, focal weakness, seizures and loss of consciousness.  Endo/Heme/Allergies: Negative for environmental allergies.  Psychiatric/Behavioral: Negative for depression, suicidal ideas and hallucinations.  All other systems reviewed and are negative.  Physical Exam: Blood pressure 120/86, pulse 80, temperature (!) 97.3 F (36.3 C), temperature source Temporal, height 5\' 2"  (1.575 m), weight 202 lb 9.6 oz (91.9 kg), SpO2 99 %. Gen:      No acute distress HEENT:  EOMI, sclera anicteric Neck:     No masses; no thyromegaly Lungs:    Clear to auscultation bilaterally; normal respiratory effort CV:         Regular rate and rhythm; no murmurs Abd:      + bowel sounds; soft, non-tender; no palpable masses, no distension Ext:    No edema; adequate peripheral perfusion Skin:      Warm and dry; no rash Neuro: alert and oriented x 3 Psych: normal mood and affect  Data Reviewed: Imaging: CT 08/15/2018-no PE, dilatation of main pulmonary artery, enlarged left axillary lymph node. PET scan 07/25/2019-metabolic left axillary mass and lymph node.  No significant lung disease noted Chest x-ray 09/15/2019-mild stable bilateral interstitial prominence.  PFTs: 10/16/2018 FVC 1.49 [78%], FEV1 1.15 [78%], F/F 77, TLC 2.85 [59%] Severe restriction.  Could not complete diffusion  capacity  Labs: CBC 09/11/2019-WBC 7.8, eos 10%, absolute eosinophil count 780  Echo 08/12/2018 LVEF 55-60%, increased thickness of atrial septum, PA  peak pressure 35  Assessment:  Dyspnea, ? ILD She does have elevated peripheral eosinophils suggestive of allergies, asthma.  History also notable for significant mold exposure raising the possibility of hypersensitivity pneumonitis  We will check baseline labs today including ANA, rheumatoid factor, CCP, hypersensitivity panel, CBC, respiratory profile and D-dimer Trial Breo inhaler and check PFTs CTA is already ordered by Dr. Yong Channel, primary care.  Will change the scan to high-resolution CT to get a better idea of interstitial lung disease.  If however D-dimer is elevated on labs then will keep CTA as scheduled.  Plan/Recommendations: - Check CTD labs, hypersensitivity panel, D-dimer and allergy profile - Breo inhaler - High-res CT of the chest - PFTs  Marshell Garfinkel MD [facility] 10/30/2019, 3:24 PM  CC: Marin Olp, MD

## 2019-10-30 NOTE — Telephone Encounter (Signed)
Per Note, reported not taking on 10/07/19.   Pt has been seen by Dr. Yong Channel 3 times this month, will forward to Dr. Yong Channel to advise regarding need for refill.

## 2019-10-30 NOTE — Telephone Encounter (Signed)
I do not believe we were using this regularly-I would call patient to see why she wants a refill

## 2019-10-30 NOTE — Patient Instructions (Signed)
We will check some labs today including D-dimer, ANA, rheumatoid factor, CCP, hypersensitivity panel, CBC differential, respiratory allergy profile We will start you on an inhaler called Breo Schedule you for high-resolution CT of the chest  Follow-up and 2 to 4 weeks.

## 2019-10-31 ENCOUNTER — Ambulatory Visit
Admission: RE | Admit: 2019-10-31 | Discharge: 2019-10-31 | Disposition: A | Discharge status: Home / Self Care | Payer: Medicare Other | Source: Ambulatory Visit | Attending: Pulmonary Disease | Admitting: Pulmonary Disease

## 2019-10-31 ENCOUNTER — Telehealth: Payer: Self-pay

## 2019-10-31 ENCOUNTER — Telehealth: Payer: Self-pay | Admitting: Pulmonary Disease

## 2019-10-31 DIAGNOSIS — R7989 Other specified abnormal findings of blood chemistry: Secondary | ICD-10-CM | POA: Diagnosis not present

## 2019-10-31 DIAGNOSIS — R0602 Shortness of breath: Secondary | ICD-10-CM | POA: Diagnosis not present

## 2019-10-31 DIAGNOSIS — R071 Chest pain on breathing: Secondary | ICD-10-CM

## 2019-10-31 LAB — CBC WITH DIFFERENTIAL/PLATELET
Basophils Absolute: 0.1 10*3/uL (ref 0.0–0.1)
Basophils Relative: 1 % (ref 0.0–3.0)
Eosinophils Absolute: 0.3 10*3/uL (ref 0.0–0.7)
Eosinophils Relative: 3.4 % (ref 0.0–5.0)
HCT: 34.7 % — ABNORMAL LOW (ref 36.0–46.0)
Hemoglobin: 11.8 g/dL — ABNORMAL LOW (ref 12.0–15.0)
Lymphocytes Relative: 19.1 % (ref 12.0–46.0)
Lymphs Abs: 1.6 10*3/uL (ref 0.7–4.0)
MCHC: 33.9 g/dL (ref 30.0–36.0)
MCV: 83.3 fl (ref 78.0–100.0)
Monocytes Absolute: 0.5 10*3/uL (ref 0.1–1.0)
Monocytes Relative: 6.5 % (ref 3.0–12.0)
Neutro Abs: 5.8 10*3/uL (ref 1.4–7.7)
Neutrophils Relative %: 70 % (ref 43.0–77.0)
Platelets: 333 10*3/uL (ref 150.0–400.0)
RBC: 4.17 Mil/uL (ref 3.87–5.11)
RDW: 16.7 % — ABNORMAL HIGH (ref 11.5–15.5)
WBC: 8.3 10*3/uL (ref 4.0–10.5)

## 2019-10-31 MED ORDER — IOPAMIDOL (ISOVUE-370) INJECTION 76%
75.0000 mL | Freq: Once | INTRAVENOUS | Status: AC | PRN
  Administered 2019-10-31: 75 mL via INTRAVENOUS

## 2019-10-31 NOTE — Telephone Encounter (Signed)
Routing to Dr. Mannam. 

## 2019-10-31 NOTE — Telephone Encounter (Signed)
Ok. Nothing to do right now, forward to Dr. Vaughan Browner to address Monday

## 2019-10-31 NOTE — Telephone Encounter (Signed)
Spoke with Valarie Merino from [facility] in regards to pt's CTA.  IMPRESSION: 1. No definitive pulmonary embolism. 2. Multiple new small bilateral pulmonary nodules consistent with metastatic disease. 3. Soft tissue density in the left axilla at the site of node dissection, probably representing scarring but I cannot exclude tumor recurrence. 4. New partially healed fracture of the posterior aspect of the right eighth rib.   Routing to [employer] as Dr. Vaughan Browner has left the office.

## 2019-10-31 NOTE — Telephone Encounter (Signed)
-----   Message from Marshell Garfinkel, MD sent at 10/31/2019  8:18 AM EST ----- D Dimer is high. Please cancel the high res CT and get a Stat CTA for PE instead.

## 2019-10-31 NOTE — Telephone Encounter (Signed)
Per Dr. Vaughan Browner I am ordering a CTA to be done due to the high D-Dimer. I have spoke with our [place] and she will try to get patient scheduled today. I have still not been able to reach patient by phone this morning, but I will continue to try.

## 2019-11-01 LAB — INTERPRETATION:

## 2019-11-01 LAB — RESPIRATORY ALLERGY PROFILE REGION II ~~LOC~~
Allergen, A. alternata, m6: <0.1 kU/L
Allergen, Cedar tree, t12: <0.1 kU/L
Allergen, Comm Silver Birch, t9: 0.21 kU/L — ABNORMAL HIGH
Allergen, Cottonwood, t14: 0.12 kU/L — ABNORMAL HIGH
Allergen, D pternoyssinus,d7: 0.78 kU/L — ABNORMAL HIGH
Allergen, Mouse Urine Protein, e78: <0.1 kU/L
Allergen, Mulberry, t76: <0.1 kU/L
Allergen, Oak,t7: <0.1 kU/L
Allergen, P. notatum, m1: <0.1 kU/L
Aspergillus fumigatus, m3: <0.1 kU/L
Bermuda Grass: 0.61 kU/L — ABNORMAL HIGH
Box Elder IgE: <0.1 kU/L
CLADOSPORIUM HERBARUM (M2) IGE: <0.1 kU/L
COMMON RAGWEED (SHORT) (W1) IGE: <0.1 kU/L
Cat Dander: <0.1 kU/L
Class: 0
Class: 0
Class: 0
Class: 0
Class: 0
Class: 0
Class: 0
Class: 0
Class: 0
Class: 0
Class: 0
Class: 0
Class: 0
Class: 0
Class: 0
Class: 1
Class: 1
Class: 1
Class: 2
Class: 2
Class: 2
Class: 3
Cockroach: 3.4 kU/L — ABNORMAL HIGH
D. farinae: 0.65 kU/L — ABNORMAL HIGH
Dog Dander: <0.1 kU/L
Elm IgE: <0.1 kU/L
IgE (Immunoglobulin E), Serum: 268 kU/L — ABNORMAL HIGH (ref <=114)
Johnson Grass: 0.65 kU/L — ABNORMAL HIGH
Pecan/Hickory Tree IgE: 5.8 kU/L — ABNORMAL HIGH
Rough Pigweed  IgE: <0.1 kU/L
Sheep Sorrel IgE: <0.1 kU/L
Timothy Grass: 1.75 kU/L — ABNORMAL HIGH

## 2019-11-01 LAB — ANTI-NUCLEAR AB-TITER (ANA TITER)
ANA TITER: 1:160 {titer} — ABNORMAL HIGH
ANA Titer 1: 1:40 {titer} — ABNORMAL HIGH

## 2019-11-01 LAB — RHEUMATOID FACTOR: Rheumatoid fact SerPl-aCnc: <14 IU/mL (ref <14)

## 2019-11-01 LAB — ANA: Anti Nuclear Antibody (ANA): POSITIVE — AB

## 2019-11-01 LAB — D-DIMER, QUANTITATIVE: D-Dimer, Quant: 4.67 mcg/mL FEU — ABNORMAL HIGH (ref <0.50)

## 2019-11-01 LAB — CYCLIC CITRUL PEPTIDE ANTIBODY, IGG: Cyclic Citrullin Peptide Ab: <16 UNITS

## 2019-11-03 ENCOUNTER — Other Ambulatory Visit: Payer: Self-pay

## 2019-11-03 ENCOUNTER — Ambulatory Visit: Payer: Medicare Other | Attending: Surgery

## 2019-11-03 DIAGNOSIS — Z170 Estrogen receptor positive status [ER+]: Secondary | ICD-10-CM | POA: Insufficient documentation

## 2019-11-03 DIAGNOSIS — I890 Lymphedema, not elsewhere classified: Secondary | ICD-10-CM | POA: Diagnosis not present

## 2019-11-03 DIAGNOSIS — C50412 Malignant neoplasm of upper-outer quadrant of left female breast: Secondary | ICD-10-CM | POA: Diagnosis not present

## 2019-11-03 NOTE — Therapy (Signed)
[place], [place], [address] Phone: [phone]   Fax:  [phone]  Physical Therapy Treatment  Patient Details  Name: Wendy [name] MRN: [id number] Date of Birth: [birth year] Referring Provider (PT): Erroll Luna MD   Encounter Date: 11/03/2019  PT End of Session - 11/03/19 1106    Visit Number  5    Number of Visits  13    Date for PT Re-Evaluation  11/11/19    Authorization Type  medicare needs 10th visit note    PT Start Time  1105    PT Stop Time  1158    PT Time Calculation (min)  53 min    Activity Tolerance  Patient tolerated treatment well    Behavior During Therapy  [facility] for tasks assessed/performed       Past Medical History:  Diagnosis Date  . Abnormal breast finding 2018   per pt/ having  a lot drainage from left breast nipple  . Anemia   . Anxiety   . Breast cancer ([place]) 11/2016   left/  . CKD (chronic kidney disease)   . Depression   . GERD (gastroesophageal reflux disease)    TUMS as needed  . HTN (hypertension)    states BP has been high recently; has been on med. x 20 yr.  . Hyperlipidemia   . Hyperplastic colon polyp   . HYPERTENSION, BENIGN SYSTEMIC 11/29/2006   Lisinopril hctz 10-12.27m, amlodipine 165m coreg 259mID, clonidine 0.3 mg BID.   . IMarland Kitchensulin dependent diabetes mellitus   . Personal history of chemotherapy    2018/finished 6 weeks of chemo in Sep 2018  . Personal history of radiation therapy    2018 left breast/finished radiation in Sept 2018 per pt.    Past Surgical History:  Procedure Laterality Date  . AXILLARY LYMPH NODE DISSECTION Left 08/05/2019   Procedure: LEFT AXILLARY LYMPH NODE DISSECTION;  Surgeon: CorErroll LunaD;  [facility];  Laterality: Left;  . BREAST LUMPECTOMY Left 05/05/2008  . BREAST LUMPECTOMY Left 12/07/2016   malignant  . BREAST LUMPECTOMY WITH RADIOACTIVE SEED AND SENTINEL LYMPH  NODE BIOPSY Left 12/07/2016   Procedure: LEFT BREAST LUMPECTOMY WITH RADIOACTIVE SEED AND SENTINEL LYMPH NODE BIOPSY;  Surgeon: ThoErroll LunaD;  [facility];  Laterality: Left;  . IR FLUORO GUIDE PORT INSERTION RIGHT  01/04/2017  . IR US [place] VASC ACCESS RIGHT  01/04/2017  . PORT-A-CATH REMOVAL N/A 05/30/2017   Procedure: REMOVAL PORT-A-CATH;  Surgeon: CorErroll LunaD;  [facility];  Laterality: N/A;  . RE-EXCISION OF BREAST LUMPECTOMY Left 12/28/2016   Procedure: RE-EXCISION OF BREAST LUMPECTOMY;  Surgeon: ThoErroll LunaD;  [facility];  Laterality: Left;    There were no vitals filed for this visit.  Subjective Assessment - 11/03/19 1106    Subjective  Pt reports that she went to pulmonary MD and was told that she has some fluid on her lungs that he thinks is related to her previous cancer. She reports that she was given an inhaler. She states that she finished the antibiotics for infection in her LUE and it is feeling much better. She states that she has a little pain in her axillary/lateral L trunk area.    Pertinent History  12/07/2016: L lumpectomy with 2 lymph node removal,Grade III, ER+/PR-/HER2- returned and matted lymph node mass was removed  on 08/05/19    Patient Stated Goals  I want to be able to move my arm more so that it won't get to where I can't use my arm.    Currently in Pain?  Yes    Pain Score  3                   Outpatient Re[facility]16/[facility]  Lymphedema Life Impact Scale Total Score  27.94 %           [facility] Adult PT Treatment/Exercise - 11/03/19 0001      Manual Therapy   Manual Therapy  Manual Lymphatic Drainage (MLD);Edema management;Myofascial release    Edema Management  Pt did not tolerate wrapping well initially and is concerned that it caused cellulitis. Discussed that this is ont  usually the causeof cellulitis but with less intensity TG soft Medium was used folded over 1/2 inch gray foam for an arm sleeve with 1 inch elastomull wrapping the fingers and TG soft small single later was used at the hand/wrist.     Myofascial Release  longitudinal over incision on the L breast and cross friction over drainage scar. Desquamation at the superior L nipple in incision. No open areas noted. Minimal improvement following myofascial release.     Manual Lymphatic Drainage (MLD)  In supine: 5 diaphragmatic breathes, short neck, swimming in the terminus, Bil shoulders, Bil axillary and L inguinal nodes, anterior inter-axillary anastomosis, L axillo-inguinal anastomosis, superior L breast, inferior L breast toward repspective anastomosis then re-worked anastomosis. re-worked all surfaces, superficial abdominals (avoid deep due to pt has fracutre on rib on the R)              PT Education - 11/03/19 1158    Education Details  Pt will wear compression as much as possible until her next session. She is aware of reasons to remove wrap.    Person(s) Educated  Patient    Methods  Explanation    Comprehension  Verbalized understanding       PT Short Term Goals - 09/16/19 1206      PT SHORT TERM GOAL #1   Title  Pt will be independent with initial HEP    Baseline  Pt currently does not have an HEP    Time  4    Period  Weeks    Status  New    Target Date  11/11/19        PT Long Term Goals - 09/16/19 1207      PT LONG TERM GOAL #1   Title  Pt will demonstrate 120 degrees L shoulder flexion/abduction within 4 weeks to demonstrate improved Functional ROM    Baseline  less than 90 degrees for both flexion and abduction of the LUE    Time  4    Period  Weeks    Status  New    Target Date  11/11/19      PT LONG TERM GOAL #2   Title  Pt will have 3 cm or greater circumferential measurement reduction in the brachium of the LUE in order to demonstrate reduction of fluid within 4  weeks.    Baseline  see measurements    Time  4    Period  Weeks    Status  New    Target Date  11/11/19      PT LONG TERM GOAL #3   Title  Pt will have a garment that she  wears on a daily basis within 4 weeks in order to improve home management of lymphedema.    Baseline  Pt does not have a compression garment.    Time  4    Period  Weeks    Status  New    Target Date  11/11/19      PT LONG TERM GOAL #4   Title  Pt will improve LLIS score to 15% or less within 4 weeks to demonstrate improved quality of life with lymphedema.    Baseline  27%    Time  4    Period  Weeks    Status  New    Target Date  11/11/19      PT LONG TERM GOAL #5   Title  Pt will report pain 1/10 or less within 4 weeks in order to demonstrate improved quality of life.    Baseline  3/10    Time  4    Period  Weeks    Status  New    Target Date  11/11/19            Plan - 11/03/19 1106    Clinical Impression Statement  Pt presents to physical therapy after a 2 week lapse related to cellulitis and then to pt new pulmonary issues. Pt was not wrapped this session due to new diagnosis and due to difficulty tolerating wraps. Folded TG soft large with 1/2 inch foam over the LUE with digits wrapped using elastomull and smallpiece TG soft over hand/wrist to see if this doesn't help decrease edema on the LUE. Re-iterated education on self MLD and performed MLD the L breast following myofascial release. Desquamation in the incision superior to the L nipple without any open areas noted. Pt will benefit from continued POC at this time.    Personal Factors and Comorbidities  Age;Comorbidity 2    Comorbidities  CKD, Hx radiation and lymph node removal    Rehab Potential  Good    PT Frequency  3x / week    PT Duration  4 weeks    PT Treatment/Interventions  Electrical Stimulation;Iontophoresis 52m/ml Dexamethasone;Moist Heat;Functional mobility training;Therapeutic activities;Therapeutic exercise;Neuromuscular  re-education;Patient/family education;Manual techniques    PT Next Visit Plan  Assess compression garment/foam and MLD, measure next session.    PT Home Exercise Plan  Access Code: [email or web address]   Consulted and Agree with Plan of Care  Patient       Patient will benefit from skilled therapeutic intervention in order to improve the following deficits and impairments:  Decreased range of motion, Pain, Increased edema, Postural dysfunction  Visit Diagnosis: Malignant neoplasm of upper-outer quadrant of left breast in female, estrogen receptor positive ([facility] [place]  Lymphedema of left arm     Problem List Patient Active Problem List   Diagnosis Date Noted  . Lymphedema of left arm 09/03/2019  . 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[facility] 12/05/2018  . Grief 05/10/2018  . Anemia 01/09/2018  . Fatigue 03/14/2017  . Edema 03/14/2017  . Shortness of breath 03/14/2017  . Port catheter in place 01/18/2017  . Rectal bleeding 01/17/2017  . Constipation 01/17/2017  . Malignant neoplasm of upper-outer quadrant of left breast in female, estrogen receptor positive ([facility] 11/20/2016  . CKD (chronic kidney disease) stage 3, GFR 30-59 ml/min (HCC) 03/17/2016  . Osteopenia 01/05/2016  . 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 Seasonal allergies 12/01/2013  . Diastolic  dysfunction 07/06/2011  . TOBACCO USE 04/13/2008  . Hyperlipidemia associated with type 2 diabetes mellitus ([place]) 11/29/2006  . OBESITY, BMI 30-35 11/29/2006  . Hypertension associated with diabetes ([place]) 11/29/2006    Ander Purpura, PT 11/03/2019, 12:01 PM  [place], [place], [address] Phone: [phone]   Fax:  [phone]  Name: Wendy [name] MRN: [id number] Date of Birth: 1940/11/06

## 2019-11-03 NOTE — Telephone Encounter (Signed)
Thanks Dr. Hart Robinsons.  I will see her back this week. [name], please schedule her with Regan Rakers or me this week. Thanks   [name] MD

## 2019-11-03 NOTE — Telephone Encounter (Signed)
I called and discussed CT scan results with patient.  There is no evidence of interstitial lung disease or pulmonary embolism. There are new lung nodule suggestive of mets from breast cancer with possible tumor recurrence in left axilla.  I will forward these results to Dr. Burr Medico and Dr. Kaleen Odea show mild elevation in ANA which is likely nonspecific.  There is elevation in IgE and eosinophils suggestive of asthma Continue Breo.  Awaiting PFTs.  We will discuss labs in detail in clinic on return.  Marshell Garfinkel MD [facility]  11/03/2019, 8:42 AM

## 2019-11-04 ENCOUNTER — Other Ambulatory Visit: Payer: 59

## 2019-11-04 LAB — HYPERSENSITIVITY PNEUMONITIS
A. Pullulans Abs: NEGATIVE
A.Fumigatus #1 Abs: NEGATIVE
Micropolyspora faeni, IgG: NEGATIVE
Pigeon Serum Abs: NEGATIVE
Thermoact. Saccharii: NEGATIVE
Thermoactinomyces vulgaris, IgG: NEGATIVE

## 2019-11-05 ENCOUNTER — Telehealth: Payer: Self-pay

## 2019-11-05 NOTE — Telephone Encounter (Signed)
Wendy [name], please schedule her to see me tomorrow afternoon.  [name]

## 2019-11-05 NOTE — Telephone Encounter (Signed)
I spoke with Wendy [name]. She is available for an appointment 11/06/2019 at 3pm with Dr. Burr Medico.  Appointment has been made.

## 2019-11-05 NOTE — Progress Notes (Signed)
[place]   [phone] [phone]   Clinic Follow up Note   Patient Care Team: Marin Olp, MD as PCP - General (Family Medicine) Philemon Kingdom, MD as Consulting Physician (Internal Medicine) Jalene Mullet, MD as Consulting Physician (Ophthalmology) Erroll Luna, MD as Consulting Physician (General Surgery) Kyung Rudd, MD as Consulting Physician (Radiation Oncology) [name], MD as Consulting Physician (Hematology) Gardenia Phlegm, NP as Nurse Practitioner (Hematology and Oncology)  Date of Service:  11/06/2019  CHIEF COMPLAINT: Follow Up for Invasive Ductal Carcinoma of the left breast  SUMMARY OF ONCOLOGIC HISTORY: Oncology History Overview Note  Cancer Staging Malignant neoplasm of upper-outer quadrant of left breast in female, estrogen receptor positive ([place]) Staging form: Breast, AJCC 8th Edition - Clinical: Stage IIB (cT2, cN0, cM0, G3, ER: Positive, PR: Negative, HER2: Negative) - Signed by [name], MD on 11/23/2016 - Pathologic stage from 12/28/2016: Stage IIB (pT2, pN1a(sn), cM0, G3, ER: Positive, PR: Negative, HER2: Negative) - Signed by [name], MD on 01/02/2017     Malignant neoplasm of upper-outer quadrant of left breast in female, estrogen receptor positive ([place])  11/01/2016 Mammogram   Diagnostic mammogram and ultrasound of left breast and axilla showed a 3.1 x 2.1 x 1.4 cm (3.3 x 2.0 x 2.7 cm by ultrasound) mass in the upper outer quadrant of the anterior third of the left breast, associated with pleomorphic calcification. There is a 8 mm (1.8cm by [name]) prominent lymph node in the left axilla.   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   11/07/2016 Receptors her2   ER 80% positive, PR negative, HER-2 negative, Ki-67 90%   11/20/2016 Initial Diagnosis   Malignant neoplasm of upper-outer quadrant of left  breast in female, estrogen receptor positive ([place])   12/07/2016 Surgery   Left lumpectomy and left axillary sentinel lymph node sampling by Dr. Brantley Stage   12/07/2016 Pathology Results   pT2, pN1 Left Lumpectomy: Grade 3 IDC measuring 3.4 cm, carcinoma broadly present at the superior margin. Grade 3 DCIS. 1 out of 2 left axillary SLN positive for metastatic carcinoma   12/07/2016 Miscellaneous   Mammaprint showed high risk disease, basal type    12/28/2016 Surgery   Re-excision of the previously positive superior margin was negative for malignant cells.    01/04/2017 Surgery   Port inserted   01/18/2017 - 03/22/2017 Chemotherapy   Adjuvant Docetaxel 75 mg/m and Cytoxan 600 mg/m, every 21 days, for total of 4 cycles, with Neulasta on day 2.    04/23/2017 - 05/18/2017 Radiation Therapy   Radiation treatment dates:   04/23/17 - 05/18/17 Administered by Dr. Lisbeth Renshaw  Site/dose:    Left breast/ 42.5 Gy in 17 Fx Boost / 7.5 Gy in 3 Fx   06/12/2017 - 08/14/2019 Anti-estrogen oral therapy   Adjuvant letrozole 1 mg daily, plan for 5-7 years. D/c on 08/14/19 due to local recurrence of left axilla.     07/20/2017 Mammogram   IMPRESSION: No mammographic evidence of malignancy in either breast. 3.8 cm left breast postsurgical loculated seroma.   09/12/2017 Survivorship     09/19/2018 Imaging   Baseline DEXA 09/19/18 ASSESSMENT: The BMD measured at Femur Neck Left is 0.888 g/cm2 with a T-score of -1.1. This patient is considered osteopenic according to [facility]) criteria.   The scan quality is good. L-4 was excluded due to degenerative changes.  Site Region Measured Date Measured Age YA BMD Significant CHANGE T-score AP Spine  L1-L3      09[age]19/[age]         -0.7    1.084 g/cm2   DualFemur Neck Left  09/19/2018    [age]         -1.1    0.888 g/cm2   DualFemur Total Mean 09/19/2018    77.2         -0.5    0.946 g/cm2 ASSESSMENT: The probability of a major  osteoporotic fracture is 4.5 % within the next ten years.   The probability of hip fracture is 0.8  % within the next 10 years.   06/25/2019 Mammogram   Diagnostic mammogram 06/25/19  IMPRESSION: 1.  Two morphologically abnormal lymph nodes in the left axilla with a lymph node in the lower left axilla measuring 2.4 x 2 x 2.6 cm.   2. Stable lumpectomy changes left breast with no findings of malignancy in either breast.   07/01/2019 Pathology Results   Diagnosis Lymph node, needle/core biopsy, left axilla - POORLY DIFFERENTIATED CARCINOMA CONSISTENT WITH BREAST PRIMARY. - NO LYMPHOID NODAL TISSUE IDENTIFIED. - SEE MICROSCOPIC DESCRIPTION. Microscopic Comment The carcinoma is consistent with grade III.  PROGNOSTIC INDICATORS Results: IMMUNOHISTOCHEMICAL AND MORPHOMETRIC ANALYSIS PERFORMED MANUALLY The tumor cells are NEGATIVE for Her2 (1+). Estrogen Receptor: 80%, POSITIVE, WEAK STAINING INTENSITY Progesterone Receptor: 0%, NEGATIVE Proliferation Marker Ki67: 80%   07/18/2019 Breast MRI   Breast MRI 07/18/19  IMPRESSION: 1. Two morphologically abnormal level 1 left axillary lymph nodes correlating with biopsy-proven malignancy. 2. No MRI evidence of malignancy in either breast. 3. Postsurgical changes of the left breast consistent with prior lumpectomy.   07/25/2019 PET scan   PET 07/25/19  IMPRESSION: 1. Hypermetabolic LEFT axillary mass consistent with metastatic breast cancer. 2. High LEFT axillary/posterior triangle (level 5) lymph node consistent with local metastasis. 3. No central mediastinal nodal metastasis. No suspicious pulmonary nodules. 4. No distant soft tissue metastasis or skeletal metastasis. 5. Calcified fibroid uterus   08/05/2019 Surgery   LEFT AXILLARY LYMPH NODE DISSECTION by Dr. Brantley Stage 08/05/19    08/05/2019 Pathology Results   FINAL MICROSCOPIC DIAGNOSIS: 08/05/19    A. LYMPH NODES, LEFT AXILLARY, DISSECTION:  - Metastatic carcinoma.  - See  comment.    08/2019 -  Anti-estrogen oral therapy   Exemestane 69m starting 08/2019   09/01/2019 Genetic Testing   [facility] Genomic Findings:  -RET amplification -CDK8 amplification -NOTCH1 deletion  -TME15splice site 3830N>M  10/76/8088Imaging   CT Angio Chest   IMPRESSION: 1. No definitive pulmonary embolism. 2. Multiple new small bilateral pulmonary nodules consistent with metastatic disease. 3. Soft tissue density in the left axilla at the site of node dissection, probably representing scarring but I cannot exclude tumor recurrence. 4. New partially healed fracture of the posterior aspect of the right eighth rib.      CURRENT THERAPY:  Exemestane 234mstarting 08/2019 PENDING Verzenio  INTERVAL HISTORY:  LuIAN CAVEYs here for a follow up of left breast cancer. She presents to the clinic with her son. She notes she has SOB which she has had for the past 2 months but progressing lately. She notes she received her first COVID19 vaccine a few weeks ago and will have her second this month. She notes her cannot walk past her mailbox without SOB. She notes this with stairs or when she gets excited. She notes recent dry cough  as well, no fever. She notes abdominal soreness from coughing so much.  Her PCP referral her to pulmonologist Dr. Vaughan Browner. She denies lung issue before this. She did not smoke but did do snuff, tobacco products. She notes occasional LE edema with no pain. She notes her energy is adequate enough to be functional. She denies significant weight loss. She notes she has normal BMs. She notes she is currently doing PT 3 times a week.    REVIEW OF SYSTEMS:   Constitutional: Denies fevers, chills or abnormal weight loss Eyes: Denies blurriness of vision Ears, nose, mouth, throat, and face: Denies mucositis or sore throat Respiratory: Denies dyspnea or wheezes (+) dry cough  Cardiovascular: Denies palpitation, chest discomfort or lower extremity swelling  Gastrointestinal:  Denies nausea, heartburn or change in bowel habits Skin: Denies abnormal skin rashes Lymphatics: Denies new lymphadenopathy or easy bruising Neurological:Denies numbness, tingling or new weaknesses Behavioral/Psych: Mood is stable, no new changes  All other systems were reviewed with the patient and are negative.  MEDICAL HISTORY:  Past Medical History:  Diagnosis Date  . Abnormal breast finding 2018   per pt/ having  a lot drainage from left breast nipple  . Anemia   . Anxiety   . Breast cancer ([place]) 11/2016   left/  . CKD (chronic kidney disease)   . Depression   . GERD (gastroesophageal reflux disease)    TUMS as needed  . HTN (hypertension)    states BP has been high recently; has been on med. x 20 yr.  . Hyperlipidemia   . Hyperplastic colon polyp   . HYPERTENSION, BENIGN SYSTEMIC 11/29/2006   Lisinopril hctz 10-12.38m, amlodipine 138m coreg 2527mID, clonidine 0.3 mg BID.   . IMarland Kitchensulin dependent diabetes mellitus   . Personal history of chemotherapy    2018/finished 6 weeks of chemo in Sep 2018  . Personal history of radiation therapy    2018 left breast/finished radiation in Sept 2018 per pt.    SURGICAL HISTORY: Past Surgical History:  Procedure Laterality Date  . AXILLARY LYMPH NODE DISSECTION Left 08/05/2019   Procedure: LEFT AXILLARY LYMPH NODE DISSECTION;  Surgeon: CorErroll LunaD;  [facility];  Laterality: Left;  . BREAST LUMPECTOMY Left 05/05/2008  . BREAST LUMPECTOMY Left 12/07/2016   malignant  . BREAST LUMPECTOMY WITH RADIOACTIVE SEED AND SENTINEL LYMPH NODE BIOPSY Left 12/07/2016   Procedure: LEFT BREAST LUMPECTOMY WITH RADIOACTIVE SEED AND SENTINEL LYMPH NODE BIOPSY;  Surgeon: ThoErroll LunaD;  [facility];  Laterality: Left;  . IR FLUORO GUIDE PORT INSERTION RIGHT  01/04/2017  . IR US [place] VASC ACCESS RIGHT  01/04/2017  . PORT-A-CATH REMOVAL N/A  05/30/2017   Procedure: REMOVAL PORT-A-CATH;  Surgeon: CorErroll LunaD;  [facility];  Laterality: N/A;  . RE-EXCISION OF BREAST LUMPECTOMY Left 12/28/2016   Procedure: RE-EXCISION OF BREAST LUMPECTOMY;  Surgeon: ThoErroll LunaD;  [facility];  Laterality: Left;    I have reviewed the social history and family history with the patient and they are unchanged from previous note.  ALLERGIES:  has No Known Allergies.  MEDICATIONS:  Current Outpatient Medications  Medication Sig Dispense Refill  . albuterol (VENTOLIN HFA) 108 (90 Base) MCG/ACT inhaler Inhale 2 puffs into the lungs every 6 (six) hours as needed for wheezing or shortness of breath. 18 g 2  . amLODipine (NORVASC) 10 MG tablet  TAKE 1 TABLET BY MOUTH EVERY DAY 90 tablet 1  . aspirin 81 MG tablet Take 81 mg by mouth daily.      . B-D INS SYRINGE 0.5CC/31GX5/16 31G X 5/16" 0.5 ML MISC USE AS DIRECTED 4 TIMES A DAY 300 each 1  . busPIRone (BUSPAR) 10 MG tablet Take 1 tablet (10 mg total) by mouth 3 (three) times daily. 270 tablet 1  . carvedilol (COREG) 25 MG tablet Take 1 tablet (25 mg total) by mouth 2 (two) times daily with a meal. 180 tablet 3  . cloNIDine (CATAPRES) 0.3 MG tablet TAKE 1 TABLET (0.3 MG TOTAL) BY MOUTH 2 (TWO) TIMES DAILY. 180 tablet 1  . exemestane (AROMASIN) 25 MG tablet Take 1 tablet (25 mg total) by mouth daily after breakfast. 90 tablet 3  . fluticasone (FLONASE) 50 MCG/ACT nasal spray Place 2 sprays into both nostrils daily. 16 g 3  . fluticasone furoate-vilanterol (BREO ELLIPTA) 200-25 MCG/INH AEPB Inhale 1 puff into the lungs daily. 14 each 0  . fluticasone furoate-vilanterol (BREO ELLIPTA) 200-25 MCG/INH AEPB Inhale 1 puff into the lungs daily. 60 each 3  . furosemide (LASIX) 20 MG tablet TAKE 1 TABLET BY MOUTH EVERY DAY AS NEEDED 30 tablet 0  . HYDROcodone-acetaminophen (NORCO/VICODIN) 5-325 MG tablet Take 1 tablet by mouth every 8  (eight) hours as needed for moderate pain. 15 tablet 0  . Insulin Glargine (LANTUS SOLOSTAR) 100 UNIT/ML Solostar Pen Inject 28 units into the skin in am 10 pen 3  . Insulin Pen Needle 31G X 5 MM MISC Use pen needles for insulin injection daily 100 each 3  . insulin regular (NOVOLIN R) 100 units/mL injection Inject 0.14-0.18 mLs (14-18 Units total) into the skin 3 (three) times daily before meals. Relion. Please provide insulin syringes needed 20 mL 11  . Insulin Syringe-Needle U-100 (B-D INS SYRINGE 0.5CC/30GX1/2") 30G X 1/2" 0.5 ML MISC USE AS DIRECTED 4 TIMES A DAY 300 each 3  . letrozole (FEMARA) 2.5 MG tablet TAKE 1 TABLET BY MOUTH EVERY DAY 90 tablet 1  . lisinopril-hydrochlorothiazide (ZESTORETIC) 20-12.5 MG tablet Take 1 tablet by mouth daily. 90 tablet 3  . lovastatin (MEVACOR) 40 MG tablet TAKE 1 TABLET BY MOUTH EVERYDAY AT BEDTIME 90 tablet 3  . metFORMIN (GLUCOPHAGE) 500 MG tablet TAKE 2 TABLETS BY MOUTH DAILY WITH SUPPER. 180 tablet 2  . OVER THE COUNTER MEDICATION Take 1 tablet by mouth daily as needed. Stool softner for constipation, also drinks warm prune juice prn    . polyethylene glycol powder (GLYCOLAX/MIRALAX) 17 GM/SCOOP powder Take 17 g by mouth daily. 1 g 0  . spironolactone (ALDACTONE) 25 MG tablet Take 0.5 tablets (12.5 mg total) by mouth daily. 15 tablet 3  . traMADol (ULTRAM) 50 MG tablet TAKE 1 TABLET BY MOUTH EVERY 6 HOURS AS NEEDED FOR MODERATE/SEVERE PAIN (NECK AND SHOULDER PAIN). 20 tablet 0  . traZODone (DESYREL) 50 MG tablet Take 0.5-1 tablets (25-50 mg total) by mouth at bedtime as needed for sleep. 30 tablet 3   No current facility-administered medications for this visit.    PHYSICAL EXAMINATION: ECOG PERFORMANCE STATUS: 2 - Symptomatic, <50% confined to bed  Vitals:   11/06/19 1502  BP: (!) 140/58  Pulse: 81  Resp: 17  Temp: 98.2 F (36.8 C)  SpO2: 100%   Filed Weights   11/06/19 1502  Weight: 198 lb 8 oz (90 kg)    GENERAL:alert, no distress  and comfortable SKIN: skin color, texture, turgor are  normal, no rashes or significant lesions EYES: normal, Conjunctiva are pink and non-injected, sclera clear  NECK: supple, thyroid normal size, non-tender, without nodularity LYMPH:  no palpable lymphadenopathy in the cervical, axillary  LUNGS: clear to auscultation and percussion with normal breathing effort HEART: regular rate & rhythm and no murmurs and no lower extremity edema ABDOMEN:abdomen soft, non-tender and normal bowel sounds (+) Abdominal distention with gas Musculoskeletal:no cyanosis of digits and no clubbing  NEURO: alert & oriented x 3 with fluent speech, no focal motor/sensory deficits BREAST: S/p left lumpectomy and axillary dissection: Surgical incision healed well (+) significant left arm lymphedema  LABORATORY DATA:  I have reviewed the data as listed CBC Latest Ref Rng & Units 10/30/2019 10/07/2019 09/15/2019  WBC 4.0 - 10.5 K/uL 8.3 7.9 7.4  Hemoglobin 12.0 - 15.0 g/dL 11.8(L) 12.1 12.5  Hematocrit 36.0 - 46.0 % 34.7(L) 36.8 37.3  Platelets 150.0 - 400.0 K/uL 333.0 318.0 345.0     CMP Latest Ref Rng & Units 10/07/2019 09/15/2019 09/11/2019  Glucose 70 - 99 mg/dL 206(H) 77 130(H)  BUN 6 - 23 mg/dL 21 22 29(H)  Creatinine 0.40 - 1.20 mg/dL 1.33(H) 1.25(H) 1.84(H)  Sodium 135 - 145 mEq/L 141 142 141  Potassium 3.5 - 5.1 mEq/L 4.0 4.6 4.3  Chloride 96 - 112 mEq/L 102 104 102  CO2 19 - 32 mEq/L 29 30 29   Calcium 8.4 - 10.5 mg/dL 9.9 10.2 8.9  Total Protein 6.0 - 8.3 g/dL 7.2 7.4 7.3  Total Bilirubin 0.2 - 1.2 mg/dL 0.3 0.3 0.2(L)  Alkaline Phos 39 - 117 U/L 109 110 106  AST 0 - 37 U/L 10 10 11(L)  ALT 0 - 35 U/L 5 5 <6      RADIOGRAPHIC STUDIES: I have personally reviewed the radiological images as listed and agreed with the findings in the report. No results found.   ASSESSMENT & PLAN:  Wendy [name] is a 79 y.o. female with    1. Malignant neoplasm of upper-outer quadrant of left breast, Invasive Ductal  Carcinoma, pT2pN1M0, stage IIB, ER+/PR-/HER2-, G3, mammaprint high risk, axillary nodes recurrence in 06/2019, lung nodules in 10/2019 -She was initiallydiagnosed in 11/2016. She was treated with left lumpectomy and SLN sampling, re-excision surgery, Adjuvant TC for 4 cycles and adjuvantradiation.  -She was on adjuvant Letrozole from 06/2017-08/14/19 -Unfortunately she had local recurrence in left axilla as seen on 06/2019 Mammogram and confirmed with 06/2019 biopsy. Grade III, ER+/PR-/HER2-. HerBreastMRI and PET scan were negative for breast or distant metastasis.  -She underwent Left axillary lymph node dissection by Dr. Brantley Stage on 08/05/19. She had complete resection. Given the extent of LNs involvement her risk for breast cancer recurrence is very high.  -Given her prior chemo treatment,and her advanced age, I did not recommend more chemo after ALND -Her FO shows RET, CDK8, NOTCH1, TP53 mutations. No ER mutation.  -She has developed dyspnea on exertion lately, seen by pulmonary, CT chest showed new growth of small lung nodules with thoracic lymphadenopathy on 10/31/19. I reviewed with her in person. This is highly suspicious for lung metastasis from her breast cancer. Unfortunately they are probably not accessible by biopsy  -I recommend a PET scan for restaging.  - Given her h/o of recurrent breast cancer I recommend she continue Exemestane and add oral biological agent Ibrance or Verzenio which improves the efficacy of antiestrogen therapy. I reviewed side effects with her such as low blood count, diarrhea, abnormal liver function, etc. She is interested.  -  Plan to start Verzenio BID in the next 1-2 weeks when she receives it  -Phone call in 2 weeks for f/u   2. Dry cough and SOB, Multiple Small Lung nodules.   -She has been having Dry Cough and SOB for the past few months and has progressed recently.  -Her PCP referred her to pulmonologist Dr. Vaughan Browner -Patient denies smoking before but notes she  has h/o of Tobacco snuff.  -We discussed her CT angio from 10/31/19 which shows multiple small nodules in her lung. Some of these were seen on 05/2019 PET scan but are now larger. There is also some enlarged LNs of chest.  -Given small size they are indeterminate and most are likely too small to biopsy or target at this point. Will repeat scan in 3 months. Will review scan with IR.  -Will obtain PET for further evaluation. She is agreeable.   -She has received first [place] vaccination, she will proceed with second this month.    3. Left arm numbness/tingling and Left arm lymphedema, secondary to surgery.  -secondary to malignant left axillary adenopathy, s/p ALND in 08/2019 -She still has left arm and hand lymphedema. I discussed her edema can improve but may have residual edema permanently.  -She is currently doing PT 3 times a week, continue.    4. HTN, DM, CKD III -F/u with PCPand endocrinologist.  -DM is not well controlled.  I encouraged her to better control her DM as this can effects her kidney function.   5. Mild anemia -Iron and TIBC, B12, MMA normal -mild and stable.   6. Osteopenia  -09/2018 DEXA shows osteopenia with lowest T-score -1.1 -Will monitor on AI. Next DEXA in 09/2020   7. Financial Support  -She paid $165 out of pocket for her exemestane.  -I encouraged her to contact financial advocate Shauna about applying for grant to cover or help cost of medication. She is interested.    Plan: -I called in [place] today. She will call me when she receives this.  -PET scan in 1-2 weeks  -Continue Exemestane  -Phone visit in 2 weeks    No problem-specific Assessment & Plan notes found for this encounter.   Orders Placed This Encounter  Procedures  . NM PET Image Restag (PS) Skull Base To Thigh    Multiple lung nodules on CT, suspicious for metastatic breast cancer    Standing Status:   Future    Standing Expiration Date:   11/05/2020    Order Specific  Question:   If indicated for the ordered procedure, I authorize the administration of a radiopharmaceutical per Radiology protocol    Answer:   Yes    Order Specific Question:   Preferred imaging location?    Answer:   [facility]    Order Specific Question:   Radiology Contrast Protocol - do NOT remove file path    Answer:   [email or web address]   All questions were answered. The patient knows to call the clinic with any problems, questions or concerns. No barriers to learning was detected. The total time spent in the appointment was 30 minutes.     [name], MD 11/06/2019   I, Joslyn Devon, am acting as scribe for [name], MD.   I have reviewed the above documentation for accuracy and completeness, and I agree with the above.

## 2019-11-06 ENCOUNTER — Other Ambulatory Visit: Payer: Self-pay

## 2019-11-06 ENCOUNTER — Encounter: Payer: Self-pay | Admitting: Hematology

## 2019-11-06 ENCOUNTER — Inpatient Hospital Stay: Payer: Medicare Other | Attending: Hematology | Admitting: Hematology

## 2019-11-06 VITALS — BP 140/58 | HR 81 | Temp 98.2°F | Resp 17 | Ht 62.0 in | Wt 198.5 lb

## 2019-11-06 DIAGNOSIS — M8580 Other specified disorders of bone density and structure, unspecified site: Secondary | ICD-10-CM | POA: Diagnosis not present

## 2019-11-06 DIAGNOSIS — Z79811 Long term (current) use of aromatase inhibitors: Secondary | ICD-10-CM | POA: Diagnosis not present

## 2019-11-06 DIAGNOSIS — C7802 Secondary malignant neoplasm of left lung: Secondary | ICD-10-CM | POA: Diagnosis not present

## 2019-11-06 DIAGNOSIS — N183 Chronic kidney disease, stage 3 unspecified: Secondary | ICD-10-CM | POA: Diagnosis not present

## 2019-11-06 DIAGNOSIS — C7801 Secondary malignant neoplasm of right lung: Secondary | ICD-10-CM | POA: Insufficient documentation

## 2019-11-06 DIAGNOSIS — C50412 Malignant neoplasm of upper-outer quadrant of left female breast: Secondary | ICD-10-CM | POA: Insufficient documentation

## 2019-11-06 DIAGNOSIS — I129 Hypertensive chronic kidney disease with stage 1 through stage 4 chronic kidney disease, or unspecified chronic kidney disease: Secondary | ICD-10-CM | POA: Insufficient documentation

## 2019-11-06 DIAGNOSIS — E1122 Type 2 diabetes mellitus with diabetic chronic kidney disease: Secondary | ICD-10-CM | POA: Diagnosis not present

## 2019-11-06 DIAGNOSIS — D649 Anemia, unspecified: Secondary | ICD-10-CM | POA: Insufficient documentation

## 2019-11-06 DIAGNOSIS — Z170 Estrogen receptor positive status [ER+]: Secondary | ICD-10-CM | POA: Diagnosis not present

## 2019-11-06 DIAGNOSIS — C773 Secondary and unspecified malignant neoplasm of axilla and upper limb lymph nodes: Secondary | ICD-10-CM | POA: Insufficient documentation

## 2019-11-06 DIAGNOSIS — Z9221 Personal history of antineoplastic chemotherapy: Secondary | ICD-10-CM | POA: Diagnosis not present

## 2019-11-06 DIAGNOSIS — Z598 Other problems related to housing and economic circumstances: Secondary | ICD-10-CM | POA: Diagnosis not present

## 2019-11-06 MED ORDER — EXEMESTANE 25 MG PO TABS: 25.0000 mg | ORAL_TABLET | Freq: Every day | ORAL | 3 refills | Status: DC

## 2019-11-07 ENCOUNTER — Telehealth: Payer: Self-pay

## 2019-11-07 ENCOUNTER — Ambulatory Visit (INDEPENDENT_AMBULATORY_CARE_PROVIDER_SITE_OTHER): Payer: Medicare Other

## 2019-11-07 ENCOUNTER — Telehealth: Payer: Self-pay | Admitting: Hematology

## 2019-11-07 ENCOUNTER — Ambulatory Visit (INDEPENDENT_AMBULATORY_CARE_PROVIDER_SITE_OTHER): Payer: Medicare Other | Admitting: Family Medicine

## 2019-11-07 ENCOUNTER — Other Ambulatory Visit: Payer: 59

## 2019-11-07 ENCOUNTER — Ambulatory Visit: Payer: Medicare Other

## 2019-11-07 ENCOUNTER — Encounter: Payer: Self-pay | Admitting: Family Medicine

## 2019-11-07 VITALS — BP 138/68 | HR 86 | Temp 98.6°F | Ht 62.0 in | Wt 197.4 lb

## 2019-11-07 DIAGNOSIS — M79672 Pain in left foot: Secondary | ICD-10-CM

## 2019-11-07 DIAGNOSIS — M25572 Pain in left ankle and joints of left foot: Secondary | ICD-10-CM

## 2019-11-07 DIAGNOSIS — R600 Localized edema: Secondary | ICD-10-CM | POA: Diagnosis not present

## 2019-11-07 DIAGNOSIS — R319 Hematuria, unspecified: Secondary | ICD-10-CM | POA: Diagnosis not present

## 2019-11-07 DIAGNOSIS — Z794 Long term (current) use of insulin: Secondary | ICD-10-CM | POA: Diagnosis not present

## 2019-11-07 DIAGNOSIS — E1165 Type 2 diabetes mellitus with hyperglycemia: Secondary | ICD-10-CM | POA: Diagnosis not present

## 2019-11-07 DIAGNOSIS — M7732 Calcaneal spur, left foot: Secondary | ICD-10-CM | POA: Diagnosis not present

## 2019-11-07 DIAGNOSIS — I10 Essential (primary) hypertension: Secondary | ICD-10-CM

## 2019-11-07 DIAGNOSIS — E113419 Type 2 diabetes mellitus with severe nonproliferative diabetic retinopathy with macular edema, unspecified eye: Secondary | ICD-10-CM

## 2019-11-07 DIAGNOSIS — IMO0002 Reserved for concepts with insufficient information to code with codable children: Secondary | ICD-10-CM

## 2019-11-07 NOTE — Progress Notes (Signed)
Phone [phone] In person visit   Subjective:   Wendy [name] is a 79 y.o. year old very pleasant female patient who presents for/with See problem oriented charting Chief Complaint  Patient presents with   Follow-up   Hypertension   This visit occurred during the SARS-CoV-2 public health emergency.  Safety protocols were in place, including screening questions prior to the visit, additional usage of staff PPE, and extensive cleaning of exam room while observing appropriate contact time as indicated for disinfecting solutions.   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, with long-term current use of insulin ([place]) 11/02/2015    Priority: High   Diastolic dysfunction 42/35/3614    Priority: High   Lymphedema of left arm 09/03/2019    Priority: Medium   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[place]) 12/05/2018    Priority: Medium   Rectal bleeding 01/17/2017    Priority: Medium   CKD (chronic kidney disease) stage 3, GFR 30-59 ml/min ([place]) 03/17/2016    Priority: Medium   TOBACCO USE 04/13/2008    Priority: Medium   Hyperlipidemia associated with type 2 diabetes mellitus ([place]) 11/29/2006    Priority: Medium   OBESITY, BMI 30-35 11/29/2006    Priority: Medium   Hypertension associated with diabetes ([place]) 11/29/2006    Priority: Medium   Fatigue 03/14/2017    Priority: Low   Edema 03/14/2017    Priority: Low   Port catheter in place 01/18/2017    Priority: Low   Constipation 01/17/2017    Priority: Low   Seasonal allergies 12/01/2013    Priority: Low   Grief 05/10/2018   Anemia 01/09/2018    Medications- reviewed and updated Current  Outpatient Medications  Medication Sig Dispense Refill   albuterol (VENTOLIN HFA) 108 (90 Base) MCG/ACT inhaler Inhale 2 puffs into the lungs every 6 (six) hours as needed for wheezing or shortness of breath. 18 g 2   amLODipine (NORVASC) 10 MG tablet TAKE 1 TABLET BY MOUTH EVERY DAY 90 tablet 1   aspirin 81 MG tablet Take 81 mg by mouth daily.       B-D INS SYRINGE 0.5CC/31GX5/16 31G X 5/16" 0.5 ML MISC USE AS DIRECTED 4 TIMES A DAY 300 each 1   busPIRone (BUSPAR) 10 MG tablet Take 1 tablet (10 mg total) by mouth 3 (three) times daily. 270 tablet 1   carvedilol (COREG) 25 MG tablet Take 1 tablet (25 mg total) by mouth 2 (two) times daily with a meal. 180 tablet 3   cloNIDine (CATAPRES) 0.3 MG tablet TAKE 1 TABLET (0.3 MG TOTAL) BY MOUTH 2 (TWO) TIMES DAILY. 180 tablet 1   exemestane (AROMASIN) 25 MG tablet Take 1 tablet (25 mg total) by mouth daily after breakfast. 90 tablet 3   fluticasone (FLONASE) 50 MCG/ACT nasal spray Place 2 sprays into both nostrils daily. 16 g 3   fluticasone furoate-vilanterol (BREO ELLIPTA) 200-25 MCG/INH AEPB Inhale 1 puff into the lungs daily. 14 each 0   fluticasone furoate-vilanterol (BREO ELLIPTA) 200-25 MCG/INH AEPB Inhale 1 puff into the lungs daily. 60 each 3   furosemide (LASIX) 20 MG tablet TAKE 1 TABLET BY MOUTH EVERY DAY AS NEEDED 30 tablet 0  HYDROcodone-acetaminophen (NORCO/VICODIN) 5-325 MG tablet Take 1 tablet by mouth every 8 (eight) hours as needed for moderate pain. 15 tablet 0   Insulin Glargine (LANTUS SOLOSTAR) 100 UNIT/ML Solostar Pen Inject 28 units into the skin in am 10 pen 3   Insulin Pen Needle 31G X 5 MM MISC Use pen needles for insulin injection daily 100 each 3   insulin regular (NOVOLIN R) 100 units/mL injection Inject 0.14-0.18 mLs (14-18 Units total) into the skin 3 (three) times daily before meals. Relion. Please provide insulin syringes needed 20 mL 11   Insulin Syringe-Needle U-100 (B-D INS SYRINGE 0.5CC/30GX1/2")  30G X 1/2" 0.5 ML MISC USE AS DIRECTED 4 TIMES A DAY 300 each 3   letrozole (FEMARA) 2.5 MG tablet TAKE 1 TABLET BY MOUTH EVERY DAY 90 tablet 1   lisinopril-hydrochlorothiazide (ZESTORETIC) 20-12.5 MG tablet Take 1 tablet by mouth daily. 90 tablet 3   lovastatin (MEVACOR) 40 MG tablet TAKE 1 TABLET BY MOUTH EVERYDAY AT BEDTIME 90 tablet 3   metFORMIN (GLUCOPHAGE) 500 MG tablet TAKE 2 TABLETS BY MOUTH DAILY WITH SUPPER. 180 tablet 2   OVER THE COUNTER MEDICATION Take 1 tablet by mouth daily as needed. Stool softner for constipation, also drinks warm prune juice prn     polyethylene glycol powder (GLYCOLAX/MIRALAX) 17 GM/SCOOP powder Take 17 g by mouth daily. 1 g 0   spironolactone (ALDACTONE) 25 MG tablet Take 0.5 tablets (12.5 mg total) by mouth daily. 15 tablet 3   traMADol (ULTRAM) 50 MG tablet TAKE 1 TABLET BY MOUTH EVERY 6 HOURS AS NEEDED FOR MODERATE/SEVERE PAIN (NECK AND SHOULDER PAIN). 20 tablet 0   traZODone (DESYREL) 50 MG tablet Take 0.5-1 tablets (25-50 mg total) by mouth at bedtime as needed for sleep. 30 tablet 3   No current facility-administered medications for this visit.     Objective:  BP 138/68    Pulse 86    Temp 98.6 F (37 C)    Ht 5\' 2"  (1.575 m)    Wt 197 lb 6.4 oz (89.5 kg)    LMP  (LMP Unknown)    SpO2 97%    BMI 36.10 kg/m  Gen: NAD, resting comfortably CV: RRR no murmurs rubs or gallops Lungs: CTAB no crackles, wheeze, rhonchi Ext: Patient tender to palpation of bilateral lower legs without significant edema, patient tender to palpation more significantly over medial and lateral ankle as well as palpation of left calcaneus-she points to area of plantar fascia as source of pain but no pain with palpation in this area    Assessment and Plan   #hypertension S: compliant with amlodipine 10 mg, carvedilol 25 mg twice a day, lisinopril hydrochlorothiazide 20-12.5 mg. For last 2 visits we tried to start spironolactone- she has started as of today and BP is  looking better. We did resistant hypertension workup but still due for renal ultrasound to complete this - doesn't think she could do 24 hour urine collection.  BP Readings from Last 3 Encounters:  11/07/19 138/68  11/06/19 (!) 140/58  10/30/19 120/86  A/P: much improved- need to check potassium today to make sure not trending up on spironolactone. Denies taking potassium.  Continue current medications. -Renal duplex planned for next week  #Left heel/ankle pain S: Patient was walking down the hall after appointment with Dr. Burr Medico of oncology yesterday.  Denies trauma or injury but suddenly she started to feel sharp pains in her left ankle and heel.  This has continued to bother her today.  She prefers to sit over walking due to increased pain with walking-does seem to be worse with first step A/P: Pain is rather diffuse today from her heel all the way up to ankle including both medial and lateral ankle.  Due to history of cancer we updated x-rays today of ankle and heel.  Patient feels like she would feel better with a wrap and Ace wrap was placed.  Encouraged her to try Tylenol first and if not effective can try tramadol which she has from the past.  We discussed if no better by Monday refer to sports medicine.  Also discussed limiting weightbearing-we actually used a wheelchair to take her out of the office just to be on the safe side and help reduce number of footsteps  # Diabetes S: Patient follows with endocrinology Dr. Cruzita Lederer Lab Results  Component Value Date   HGBA1C 8.4 (A) 07/24/2019   HGBA1C 8.8 (H) 04/01/2019   HGBA1C 8.6 (A) 09/05/2018    A/P: Per protocol team ordered A1c today as part of updated blood work-I will plan to forward this to Dr. Cruzita Lederer for management on visit on the 16th.  Slightly poorly controlled-continue current medications for now   #Shortness of breath-patient is very discouraged that areas on the lung could potentially be related to cancer.  I attempted to  comfort her today.  Has upcoming PET scan.  #Hematuria-previously noted on UA-get urine microscopic today  Recommended follow up: We did not schedule plan follow-up but 2 months would be reasonable to recheck potassium-we will have team reach out because looks like she is scheduled for 6 days Future Appointments  Date Time Provider [place]  11/10/2019  9:00 AM [facility] VASC 3 [facility]  11/13/2019  1:40 PM Marin Olp, MD [facility] PEC  11/18/2019  2:20 PM Philemon Kingdom, MD [facility] None  11/21/2019  2:20 PM [name], MD [facility] None  11/25/2019 11:15 AM Marshell Garfinkel, MD [facility] None  12/04/2019 12:45 PM [facility] LAB 6 [facility] None  12/04/2019  1:20 PM [name], MD [facility] None  03/11/2020  1:30 PM [facility] LAB 1 [facility] None  03/11/2020  2:00 PM [name], MD [facility] None    Lab/Order associations:   ICD-10-CM   1. Resistant hypertension  I10 CANCELED: BMET future  2. Hematuria, unspecified type  R31.9 CANCELED: Urine Microscopic Only  3. Acute left ankle pain  M25.572 DG Ankle Complete Left    CANCELED: DG Ankle Complete Right  4. Pain of left heel  M79.672 DG Ankle Complete Left    CANCELED: DG Ankle Complete Right  5. 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 ratio    CANCELED: Hemoglobin A1c   Time Spent: 30 minutes of total time (20 minutes during visit, 10 minutes post visit with documentation) was spent on the date of the encounter performing the following actions: chart review prior to seeing the patient, obtaining history, performing a medically necessary exam, counseling on the treatment plan, placing orders, and documenting in our EHR.   Return precautions advised.  Garret Reddish, MD

## 2019-11-07 NOTE — Patient Instructions (Addendum)
Please stop by lab and x-ray before you go If you do not have mychart- we will call you about results within 5 business days of [name] receiving them.  If you have mychart- we will send your results within 3 business days of [name] receiving them.  If abnormal or we want to clarify a result, we will call or mychart you to make sure you receive the message.  If you have questions or concerns or don't hear within 5-7 days, please send [name] a message or call us.   Blood pressure looks better otday- we are also going to check potassium   After x-ray,  Wendy [name] can ace wrap this for you after, you can redo this daily to get decent pressure. Take it easy on feet over weekend. Can take tylenol for pain. A tramadol if you have to for pain from prior supply   If x-rays ok but pain still as bad on Monday- lets send you to sports medicine (reach out to [name] so we can refer)

## 2019-11-07 NOTE — Telephone Encounter (Signed)
Forms for DM supplies filled out, signed by Dr. Cruzita Lederer and faxed to [place] with confirmation.

## 2019-11-07 NOTE — Telephone Encounter (Signed)
Scheduled appt per 2/4 los.  Left a vm of the appt date and time.

## 2019-11-08 LAB — BASIC METABOLIC PANEL
BUN/Creatinine Ratio: 19 (calc) (ref 6–22)
BUN: 31 mg/dL — ABNORMAL HIGH (ref 7–25)
CO2: 25 mmol/L (ref 20–32)
Calcium: 8.9 mg/dL (ref 8.6–10.4)
Chloride: 98 mmol/L (ref 98–110)
Creat: 1.66 mg/dL — ABNORMAL HIGH (ref 0.60–0.93)
Glucose, Bld: 322 mg/dL — ABNORMAL HIGH (ref 65–99)
Potassium: 4.4 mmol/L (ref 3.5–5.3)
Sodium: 133 mmol/L — ABNORMAL LOW (ref 135–146)

## 2019-11-08 LAB — HEMOGLOBIN A1C
Hgb A1c MFr Bld: 8.5 % of total Hgb — ABNORMAL HIGH (ref <5.7)
Mean Plasma Glucose: 197 (calc)
eAG (mmol/L): 10.9 (calc)

## 2019-11-08 LAB — MICROALBUMIN / CREATININE URINE RATIO
Creatinine, Urine: 128 mg/dL (ref 20–275)
Microalb Creat Ratio: 513 mcg/mg creat — ABNORMAL HIGH (ref <30)
Microalb, Ur: 65.7 mg/dL

## 2019-11-10 ENCOUNTER — Telehealth: Payer: Self-pay

## 2019-11-10 ENCOUNTER — Other Ambulatory Visit: Payer: Self-pay

## 2019-11-10 ENCOUNTER — Ambulatory Visit (HOSPITAL_COMMUNITY)
Admission: RE | Admit: 2019-11-10 | Discharge: 2019-11-10 | Disposition: A | Discharge status: Home / Self Care | Payer: Medicare Other | Source: Ambulatory Visit | Attending: Cardiology | Admitting: Cardiology

## 2019-11-10 DIAGNOSIS — I10 Essential (primary) hypertension: Secondary | ICD-10-CM | POA: Diagnosis not present

## 2019-11-10 NOTE — Telephone Encounter (Signed)
lvm patient is on the schedule for 11/13/19 Dr. Yong Channel would like to see her in 2 months if pt needed to be seen before thn he would be happy to pt.

## 2019-11-10 NOTE — Progress Notes (Signed)
No obvious fracture or cancer found in the bones of the ankle.There is a heel spur which could be causing/related to some of her pain.  She also has some swelling in the foot and ankle-team please refer to podiatry if she is continuing to have such significant foot painIn addition she is scheduled for an visit on the 11th with me-I had sent this to you as CCed chart on 11/07/2019-lets push visit out about 2 months from last visit

## 2019-11-13 ENCOUNTER — Telehealth: Payer: Self-pay | Admitting: Pulmonary Disease

## 2019-11-13 ENCOUNTER — Ambulatory Visit: Payer: 59 | Admitting: Family Medicine

## 2019-11-13 ENCOUNTER — Other Ambulatory Visit: Payer: Self-pay | Admitting: Hematology

## 2019-11-13 DIAGNOSIS — R0602 Shortness of breath: Secondary | ICD-10-CM

## 2019-11-13 MED ORDER — BREO ELLIPTA 200-25 MCG/INH IN AEPB: 1.0000 | INHALATION_SPRAY | Freq: Every day | RESPIRATORY_TRACT | 0 refills | Status: DC

## 2019-11-13 NOTE — Telephone Encounter (Signed)
Spoke with pt's daughter. She states the [place] is too expensive and she would like samples of Breo. I offered her two samples of Breo 200. She has an appointment on 2/23 and I advised her to bring the drug formulary to her appt. She understood and will relay the message to her mom. Nothing further is needed.

## 2019-11-14 ENCOUNTER — Other Ambulatory Visit: Payer: Self-pay

## 2019-11-14 DIAGNOSIS — M79672 Pain in left foot: Secondary | ICD-10-CM

## 2019-11-14 DIAGNOSIS — M25572 Pain in left ankle and joints of left foot: Secondary | ICD-10-CM

## 2019-11-14 MED FILL — EXEMESTANE 25 MG TABLET: 25 | 30 days supply | Qty: 30 | Fill #0

## 2019-11-14 NOTE — Progress Notes (Signed)
Referral to

## 2019-11-18 ENCOUNTER — Ambulatory Visit: Payer: 59 | Admitting: Internal Medicine

## 2019-11-21 ENCOUNTER — Telehealth: Payer: Self-pay | Admitting: Hematology

## 2019-11-21 ENCOUNTER — Inpatient Hospital Stay: Payer: Medicare Other | Admitting: Hematology

## 2019-11-21 ENCOUNTER — Telehealth: Payer: Self-pay

## 2019-11-21 ENCOUNTER — Telehealth: Payer: Self-pay | Admitting: Pharmacist

## 2019-11-21 MED ORDER — ABEMACICLIB 150 MG PO TABS: 150.0000 mg | ORAL_TABLET | Freq: Two times a day (BID) | ORAL | 1 refills | Status: DC

## 2019-11-21 NOTE — Telephone Encounter (Signed)
Oral Oncology Pharmacist Encounter  Received new prescription for Verzenio (abemaciclib) for the treatment of metastatic breast cancer, HR positive, HER2 negative in conjunction with exemestane, planned duration until disease progression or unacceptable drug toxicity.  CMP from 10/07/19 assessed, no relevant lab abnormalities. Prescription dose and frequency assessed.   Current medication list in Epic reviewed, one DDIs with abemaciclib identified: -Metformin: abemeciclib may increase the concentration of metformin. Monitor patient for increased AE related to increased metformin exposure (ie, hypoglycemia)  Prescription has been e-scribed to the [facility] for benefits analysis and approval.  Oral Oncology Clinic will continue to follow for insurance authorization, copayment issues, initial counseling and start date.  Darl Pikes, PharmD, BCPS, [facility] Hematology/Oncology Clinical Pharmacist [facility] [phone]  11/21/2019 3:17 PM

## 2019-11-21 NOTE — Telephone Encounter (Signed)
R/s appt per 2/19 sch message - pt aware of appt date and time

## 2019-11-21 NOTE — Telephone Encounter (Signed)
Oral Oncology Patient Advocate Encounter  Received notification from [facility] that prior authorization for Verzenio is required.  PA submitted on CoverMyMeds Key [id number] Status is pending  Oral Oncology Clinic will continue to follow.  [place] Patient [place] Phone [phone] Fax [phone] 11/21/2019 2:44 PM

## 2019-11-24 NOTE — Telephone Encounter (Signed)
Oral Oncology Patient Advocate Encounter  Prior Authorization for Melynda Keller has been approved.    PA# [id number] Effective dates: 11/21/19 through further notice  Patients co-pay is $2889.09  Oral Oncology Clinic will continue to follow.   [place] Patient [place] Phone [phone] Fax [phone] 11/24/2019 8:54 AM

## 2019-11-25 ENCOUNTER — Ambulatory Visit: Payer: 59 | Admitting: Pulmonary Disease

## 2019-11-26 ENCOUNTER — Ambulatory Visit (HOSPITAL_COMMUNITY): Admission: RE | Admit: 2019-11-26 | Payer: Medicare Other | Source: Ambulatory Visit

## 2019-11-27 ENCOUNTER — Telehealth: Payer: Self-pay | Admitting: Hematology

## 2019-11-27 ENCOUNTER — Inpatient Hospital Stay: Payer: Medicare Other | Admitting: Hematology

## 2019-11-27 NOTE — Telephone Encounter (Signed)
R/s appt per 2/25 sch message - pt aware of appt date and time

## 2019-12-01 MED FILL — VERZENIO 150 MG TAB: 150 | 28 days supply | Qty: 56 | Fill #0

## 2019-12-01 NOTE — Telephone Encounter (Signed)
Oral Chemotherapy Pharmacist Encounter  [facility] will deliver Verzenio to Wendy [name] on 12/02/19. She knows to get started when the receives her medication.  Patient Education I spoke with patient for overview of new oral chemotherapy medication: Verzenio (abemaciclib) for the treatment of metastatic breast cancer, HR positive, HER2 negative in conjunction with exemestane, planned duration until disease progression or unacceptable drug toxicity.   Counseled patient on administration, dosing, side effects, monitoring, drug-food interactions, safe handling, storage, and disposal. Patient will take 1 tablet (150 mg total) by mouth 2 (two) times daily. Swallow tablets whole.  Side effects include but not limited to: diarrhea, N/V, decreased wbc, fatigue.    Discussed the importance of picking up some loperamide to have on hand for any diarrhea.  Reviewed with patient importance of keeping a medication schedule and plan for any missed doses.  Wendy [name] voiced understanding and appreciation. All questions answered. Medication handout placed in the mail.  Provided patient with [facility] phone number. Patient knows to call the office with questions or concerns. Oral Chemotherapy [facility] Clinic will continue to follow.  Darl Pikes, PharmD, BCPS, [facility] Hematology/Oncology Clinical Pharmacist [facility] [place] [facility] [phone]  12/01/2019 3:11 PM

## 2019-12-03 ENCOUNTER — Ambulatory Visit (INDEPENDENT_AMBULATORY_CARE_PROVIDER_SITE_OTHER): Payer: Medicare Other | Admitting: Pulmonary Disease

## 2019-12-03 ENCOUNTER — Other Ambulatory Visit: Payer: Self-pay

## 2019-12-03 ENCOUNTER — Encounter: Payer: Self-pay | Admitting: Pulmonary Disease

## 2019-12-03 VITALS — BP 122/76 | HR 70 | Temp 97.2°F | Ht 62.0 in | Wt 190.8 lb

## 2019-12-03 DIAGNOSIS — J4540 Moderate persistent asthma, uncomplicated: Secondary | ICD-10-CM | POA: Diagnosis not present

## 2019-12-03 NOTE — Patient Instructions (Signed)
Continue the Breo for now as it is helping the breathing Reviewed the pharmacy for evaluation and see if there are cheaper alternatives available We will make sure that the PFTs have already been ordered and scheduled  Follow-up in 6 months

## 2019-12-03 NOTE — Progress Notes (Signed)
Wendy [name]    [id number]    [birth year]  Primary Care Physician:Hunter, Brayton Mars, MD  Referring Physician: Marin Olp, MD Wendy [name],  [place] [address]  Chief complaint: Consult for dyspnea  HPI: 79 year old with history of breast cancer, hypertension, CKD, hyperlipidemia, diastolic heart failure Referred for evaluation of dyspnea.  She has increasing symptoms over the past 2 months with dyspnea on exertion and at rest.  No cough, sputum production or wheezing.  History of left breast cancer with recurrence in September 2020 with lymph node dissection.  Follows with Dr. Burr [name], oncology.  I have reviewed the primary care and specialist notes.  Pets: No pets Occupation: Used to run a daycare.  Currently on disability Exposures: Had significant mold exposure in her bathroom which was fixed recently.  No hot tub, Jacuzzi, down pillows or comforter Smoking history: Never smoker Travel history: No significant travel Relevant family history: No significant family history of lung disease  Interval history: States that breathing is better after starting Breo Awaiting PFTs  She had a CT scan which showed no pulmonary embolism or interstitial lung disease.  There were new pulmonary nodules suspicious of metastatic breast cancer.  She has followed up with Dr.  Burr [name] and has been started on Verzenio.  PET scan is pending.   Outpatient Encounter Medications as of 12/03/2019  Medication Sig  . abemaciclib (VERZENIO) 150 MG tablet Take 1 tablet (150 mg total) by mouth 2 (two) times daily. Swallow tablets whole. Do not chew, crush, or split tablets before swallowing.  . albuterol (VENTOLIN HFA) 108 (90 Base) MCG/ACT inhaler Inhale 2 puffs into the lungs every 6 (six) hours as needed for wheezing or shortness of breath.  Marland Kitchen amLODipine (NORVASC) 10 MG tablet TAKE 1 TABLET BY MOUTH EVERY DAY  . aspirin 81 MG tablet Take 81 mg by mouth daily.    . B-D INS SYRINGE  0.5CC/31GX5/16 31G X 5/16" 0.5 ML MISC USE AS DIRECTED 4 TIMES A DAY  . busPIRone (BUSPAR) 10 MG tablet Take 1 tablet (10 mg total) by mouth 3 (three) times daily.  . carvedilol (COREG) 25 MG tablet Take 1 tablet (25 mg total) by mouth 2 (two) times daily with a meal.  . cloNIDine (CATAPRES) 0.3 MG tablet TAKE 1 TABLET (0.3 MG TOTAL) BY MOUTH 2 (TWO) TIMES DAILY.  Marland Kitchen exemestane (AROMASIN) 25 MG tablet TAKE 1 TABLET BY MOUTH ONCE A DAY AFTER BREAKFAST  . fluticasone (FLONASE) 50 MCG/ACT nasal spray Place 2 sprays into both nostrils daily.  . fluticasone furoate-vilanterol (BREO ELLIPTA) 200-25 MCG/INH AEPB Inhale 1 puff into the lungs daily.  . fluticasone furoate-vilanterol (BREO ELLIPTA) 200-25 MCG/INH AEPB Inhale 1 puff into the lungs daily.  . furosemide (LASIX) 20 MG tablet TAKE 1 TABLET BY MOUTH EVERY DAY AS NEEDED  . HYDROcodone-acetaminophen (NORCO/VICODIN) 5-325 MG tablet Take 1 tablet by mouth every 8 (eight) hours as needed for moderate pain.  . Insulin Glargine (LANTUS SOLOSTAR) 100 UNIT/ML Solostar Pen Inject 28 units into the skin in am  . Insulin Pen Needle 31G X 5 MM MISC Use pen needles for insulin injection daily  . insulin regular (NOVOLIN R) 100 units/mL injection Inject 0.14-0.18 mLs (14-18 Units total) into the skin 3 (three) times daily before meals. Relion. Please provide insulin syringes needed  . Insulin Syringe-Needle U-100 (B-D INS SYRINGE 0.5CC/30GX1/2") 30G X 1/2" 0.5 ML MISC USE AS DIRECTED 4 TIMES A DAY  .  lisinopril-hydrochlorothiazide (ZESTORETIC) 20-12.5 MG tablet Take 1 tablet by mouth daily.  Marland Kitchen lovastatin (MEVACOR) 40 MG tablet TAKE 1 TABLET BY MOUTH EVERYDAY AT BEDTIME  . metFORMIN (GLUCOPHAGE) 500 MG tablet TAKE 2 TABLETS BY MOUTH DAILY WITH SUPPER.  Marland Kitchen OVER THE COUNTER MEDICATION Take 1 tablet by mouth daily as needed. Stool softner for constipation, also drinks warm prune juice prn  . polyethylene glycol powder (GLYCOLAX/MIRALAX) 17 GM/SCOOP powder Take 17 g by  mouth daily.  Marland Kitchen spironolactone (ALDACTONE) 25 MG tablet Take 0.5 tablets (12.5 mg total) by mouth daily.  . traMADol (ULTRAM) 50 MG tablet TAKE 1 TABLET BY MOUTH EVERY 6 HOURS AS NEEDED FOR MODERATE/SEVERE PAIN (NECK AND SHOULDER PAIN).  Marland Kitchen traZODone (DESYREL) 50 MG tablet Take 0.5-1 tablets (25-50 mg total) by mouth at bedtime as needed for sleep.   No facility-administered encounter medications on file as of 12/03/2019.   Physical Exam: Blood pressure 122/76, pulse 70, temperature (!) 97.2 F (36.2 C), temperature source Temporal, height 5\' 2"  (1.575 m), weight 190 lb 12.8 oz (86.5 kg), SpO2 99 %. Gen:      No acute distress HEENT:  EOMI, sclera anicteric Neck:     No masses; no thyromegaly Lungs:    Clear to auscultation bilaterally; normal respiratory effort CV:         Regular rate and rhythm; no murmurs Abd:      + bowel sounds; soft, non-tender; no palpable masses, no distension Ext:    No edema; adequate peripheral perfusion Skin:      Warm and dry; no rash Neuro: alert and oriented x 3 Psych: normal mood and affect  Data Reviewed: Imaging: CT 08/15/2018-no PE, dilatation of main pulmonary artery, enlarged left axillary lymph node. PET scan 07/25/2019-metabolic left axillary mass and lymph node.  No significant lung disease noted CTA 10/31/2019-PE, multiple small bilateral pulmonary nodules with soft tissue density concerning for metastatic cancer Reviewed images personally.  PFTs: 10/16/2018 FVC 1.49 [78%], FEV1 1.15 [78%], F/F 77, TLC 2.85 [59%] Severe restriction.  Could not complete diffusion capacity  Labs: CBC 09/11/2019-WBC 7.8, eos 10%, absolute eosinophil count 780  CTD serologies 10/29/2018 1-1 is 240 ANA, negative CCP, rheumatoid factor RAST profile 10/30/2019-IgE 268, sensitive to grass cockroach, tree pollen HP panel 10/22/2019-negative  Echo 08/12/2018 LVEF 55-60%, increased thickness of atrial septum, PA peak pressure 35  Assessment:  Asthma, dyspnea She  does have elevated peripheral eosinophils suggestive of allergies, asthma.  History also notable for significant mold exposure but no ILD on CT scan.  Borderline positive ANA is likely non significant.  She has responded well to [facility] but has problems with the cost of medication Referral to pharmacy to check for cheaper alternative Awaiting PFTs.  Plan/Recommendations: - Continue inhalers, pharmacy referral - Awaiting PFTs.  Marshell Garfinkel MD [facility] 12/03/2019, 11:30 AM  CC: Wendy Olp, MD

## 2019-12-04 ENCOUNTER — Telehealth: Payer: Self-pay | Admitting: Internal Medicine

## 2019-12-04 ENCOUNTER — Ambulatory Visit: Payer: Medicare Other | Admitting: Hematology

## 2019-12-04 ENCOUNTER — Other Ambulatory Visit: Payer: Medicare Other

## 2019-12-04 NOTE — Progress Notes (Signed)
[place]   [phone] [phone]   Clinic Follow up Note   Patient Care Team: Marin Olp, MD as PCP - General (Family Medicine) Philemon Kingdom, MD as Consulting Physician (Internal Medicine) Jalene Mullet, MD as Consulting Physician (Ophthalmology) Erroll Luna, MD as Consulting Physician (General Surgery) Kyung Rudd, MD as Consulting Physician (Radiation Oncology) [name], MD as Consulting Physician (Hematology) Gardenia Phlegm, NP as Nurse Practitioner (Hematology and Oncology)  Date of Service:  12/11/2019  CHIEF COMPLAINT: Follow Up for Invasive Ductal Carcinoma of the left breast  SUMMARY OF ONCOLOGIC HISTORY: Oncology History Overview Note  Cancer Staging Malignant neoplasm of upper-outer quadrant of left breast in female, estrogen receptor positive ([place]) Staging form: Breast, AJCC 8th Edition - Clinical: Stage IIB (cT2, cN0, cM0, G3, ER: Positive, PR: Negative, HER2: Negative) - Signed by [name], MD on 11/23/2016 - Pathologic stage from 12/28/2016: Stage IIB (pT2, pN1a(sn), cM0, G3, ER: Positive, PR: Negative, HER2: Negative) - Signed by [name], MD on 01/02/2017     Malignant neoplasm of upper-outer quadrant of left breast in female, estrogen receptor positive ([place])  11/01/2016 Mammogram   Diagnostic mammogram and ultrasound of left breast and axilla showed a 3.1 x 2.1 x 1.4 cm (3.3 x 2.0 x 2.7 cm by ultrasound) mass in the upper outer quadrant of the anterior third of the left breast, associated with pleomorphic calcification. There is a 8 mm (1.8cm by [name]) prominent lymph node in the left axilla.   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   11/07/2016 Receptors her2   ER 80% positive, PR negative, HER-2 negative, Ki-67 90%   11/20/2016 Initial Diagnosis   Malignant neoplasm of upper-outer quadrant of left  breast in female, estrogen receptor positive ([place])   12/07/2016 Surgery   Left lumpectomy and left axillary sentinel lymph node sampling by Dr. Brantley Stage   12/07/2016 Pathology Results   pT2, pN1 Left Lumpectomy: Grade 3 IDC measuring 3.4 cm, carcinoma broadly present at the superior margin. Grade 3 DCIS. 1 out of 2 left axillary SLN positive for metastatic carcinoma   12/07/2016 Miscellaneous   Mammaprint showed high risk disease, basal type    12/28/2016 Surgery   Re-excision of the previously positive superior margin was negative for malignant cells.    01/04/2017 Surgery   Port inserted   01/18/2017 - 03/22/2017 Chemotherapy   Adjuvant Docetaxel 75 mg/m and Cytoxan 600 mg/m, every 21 days, for total of 4 cycles, with Neulasta on day 2.    04/23/2017 - 05/18/2017 Radiation Therapy   Radiation treatment dates:   04/23/17 - 05/18/17 Administered by Dr. Lisbeth Renshaw  Site/dose:    Left breast/ 42.5 Gy in 17 Fx Boost / 7.5 Gy in 3 Fx   06/12/2017 - 08/14/2019 Anti-estrogen oral therapy   Adjuvant letrozole 1 mg daily, plan for 5-7 years. D/c on 08/14/19 due to local recurrence of left axilla.     07/20/2017 Mammogram   IMPRESSION: No mammographic evidence of malignancy in either breast. 3.8 cm left breast postsurgical loculated seroma.   09/12/2017 Survivorship     09/19/2018 Imaging   Baseline DEXA 09/19/18 ASSESSMENT: The BMD measured at Femur Neck Left is 0.888 g/cm2 with a T-score of -1.1. This patient is considered osteopenic according to [facility]) criteria.   The scan quality is good. L-4 was excluded due to degenerative changes.  Site Region Measured Date Measured Age YA BMD Significant CHANGE T-score AP Spine  L1-L3      09[age]19/[age]         -0.7    1.084 g/cm2   DualFemur Neck Left  09/19/2018    [age]         -1.1    0.888 g/cm2   DualFemur Total Mean 09/19/2018    77.2         -0.5    0.946 g/cm2 ASSESSMENT: The probability of a major  osteoporotic fracture is 4.5 % within the next ten years.   The probability of hip fracture is 0.8  % within the next 10 years.   06/25/2019 Mammogram   Diagnostic mammogram 06/25/19  IMPRESSION: 1.  Two morphologically abnormal lymph nodes in the left axilla with a lymph node in the lower left axilla measuring 2.4 x 2 x 2.6 cm.   2. Stable lumpectomy changes left breast with no findings of malignancy in either breast.   07/01/2019 Pathology Results   Diagnosis Lymph node, needle/core biopsy, left axilla - POORLY DIFFERENTIATED CARCINOMA CONSISTENT WITH BREAST PRIMARY. - NO LYMPHOID NODAL TISSUE IDENTIFIED. - SEE MICROSCOPIC DESCRIPTION. Microscopic Comment The carcinoma is consistent with grade III.  PROGNOSTIC INDICATORS Results: IMMUNOHISTOCHEMICAL AND MORPHOMETRIC ANALYSIS PERFORMED MANUALLY The tumor cells are NEGATIVE for Her2 (1+). Estrogen Receptor: 80%, POSITIVE, WEAK STAINING INTENSITY Progesterone Receptor: 0%, NEGATIVE Proliferation Marker Ki67: 80%   07/18/2019 Breast MRI   Breast MRI 07/18/19  IMPRESSION: 1. Two morphologically abnormal level 1 left axillary lymph nodes correlating with biopsy-proven malignancy. 2. No MRI evidence of malignancy in either breast. 3. Postsurgical changes of the left breast consistent with prior lumpectomy.   07/25/2019 PET scan   PET 07/25/19  IMPRESSION: 1. Hypermetabolic LEFT axillary mass consistent with metastatic breast cancer. 2. High LEFT axillary/posterior triangle (level 5) lymph node consistent with local metastasis. 3. No central mediastinal nodal metastasis. No suspicious pulmonary nodules. 4. No distant soft tissue metastasis or skeletal metastasis. 5. Calcified fibroid uterus   08/05/2019 Surgery   LEFT AXILLARY LYMPH NODE DISSECTION by Dr. Brantley Stage 08/05/19    08/05/2019 Pathology Results   FINAL MICROSCOPIC DIAGNOSIS: 08/05/19    A. LYMPH NODES, LEFT AXILLARY, DISSECTION:  - Metastatic carcinoma.  - See  comment.    08/2019 -  Anti-estrogen oral therapy   Exemestane 64m starting 08/2019   09/01/2019 Genetic Testing   [employer] [facility] Genomic Findings:  -RET amplification -CDK8 amplification -NOTCH1 deletion  -TPZ02splice site 3585I>D  17/82/4235Imaging   CT Angio Chest   IMPRESSION: 1. No definitive pulmonary embolism. 2. Multiple new small bilateral pulmonary nodules consistent with metastatic disease. 3. Soft tissue density in the left axilla at the site of node dissection, probably representing scarring but I cannot exclude tumor recurrence. 4. New partially healed fracture of the posterior aspect of the right eighth rib.   12/04/2019 -  Chemotherapy   Verzenio 1594mBID starting 12/04/19      CURRENT THERAPY:  Exemestane 2557mtarting 08/2019 Verzenio 150m47mD starting 12/04/19   INTERVAL HISTORY:  Wendy BUDZIKhere for a follow up. She presents to the clinic with her son. She notes she is doing better. She notes last month she was bed ridden for 2 weeks with no appetite, SOB and weakness. She notes since then she recovered. She notes she did not test to see if she had COVID, but did get her COVID19 vaccine. She  notes she went to PCP last week and she was given Breo to help her wheezing. However the drug costs. She notes she received Verzenio 18m BID trough free trail and started on 12/04/19. She notes he did not have to pay out of pocket for this. She notes she only had nausea during the first week not resolved. For her DM is on Lantus and short acting insulin. Her BG at home was 144 at home today. She notes she has not been to PT in the past few weeks due to her many appointments and hopes to return soon.     REVIEW OF SYSTEMS:   Constitutional: Denies fevers, chills or abnormal weight loss Eyes: Denies blurriness of vision Ears, nose, mouth, throat, and face: Denies mucositis or sore throat Respiratory: Denies cough, dyspnea or wheezes Cardiovascular: Denies  palpitation, chest discomfort or lower extremity swelling Gastrointestinal:  Denies nausea, heartburn or change in bowel habits Skin: Denies abnormal skin rashes Lymphatics: Denies new lymphadenopathy or easy bruising Neurological:Denies numbness, tingling or new weaknesses Behavioral/Psych: Mood is stable, no new changes  All other systems were reviewed with the patient and are negative.  MEDICAL HISTORY:  Past Medical History:  Diagnosis Date  . Abnormal breast finding 2018   per pt/ having  a lot drainage from left breast nipple  . Anemia   . Anxiety   . [place]   left/  . CKD (chronic kidney disease)   . Depression   . GERD (gastroesophageal reflux disease)    TUMS as needed  . HTN (hypertension)    states BP has been high recently; has been on med. x 20 yr.  . Hyperlipidemia   . Hyperplastic colon polyp   . HYPERTENSION, BENIGN SYSTEMIC 11/29/2006   Lisinopril hctz 10-12.568m amlodipine 1051mcoreg 81m40mD, clonidine 0.3 mg BID.   . In[place] Kitchenulin dependent diabetes mellitus   . Personal history of chemotherapy    2018/finished 6 weeks of chemo in Sep 2018  . Personal history of radiation therapy    2018 left breast/finished radiation in Sept 2018 per pt.    SURGICAL HISTORY: Past Surgical History:  Procedure Laterality Date  . AXILLARY LYMPH NODE DISSECTION Left 08/05/2019   Procedure: LEFT AXILLARY LYMPH NODE DISSECTION;  Surgeon: CornErroll Luna;  Location: [facility]: General;  Laterality: Left;  . BREAST LUMPECTOMY Left 05/05/2008  . BREAST LUMPECTOMY Left 12/07/2016   malignant  . BREAST LUMPECTOMY WITH RADIOACTIVE SEED AND SENTINEL LYMPH NODE BIOPSY Left 12/07/2016   Procedure: LEFT BREAST LUMPECTOMY WITH RADIOACTIVE SEED AND SENTINEL LYMPH NODE BIOPSY;  Surgeon: ThomErroll Luna;  [place] [facility] General;  Laterality: Left;  . IR FLUORO GUIDE PORT INSERTION RIGHT  01/04/2017  . IR US G[place]DE  VASC ACCESS RIGHT  01/04/2017  . PORT-A-CATH REMOVAL N/A 05/30/2017   Procedure: REMOVAL PORT-A-CATH;  Surgeon: CornErroll Luna;  [facility] [facility] [place];  Laterality: N/A;  . RE-EXCISION OF BREAST LUMPECTOMY Left 12/28/2016   Procedure: RE-EXCISION OF BREAST LUMPECTOMY;  Surgeon: ThomErroll Luna;  Location: MC OCooterervice: General;  Laterality: Left;    I have reviewed the social history and family history with the patient and they are unchanged from previous note.  ALLERGIES:  has No Known Allergies.  MEDICATIONS:  Current Outpatient Medications  Medication Sig Dispense Refill  . abemaciclib (VERZENIO) 150 MG tablet Take 1 tablet (150 mg total) by mouth 2 (  two) times daily. Swallow tablets whole. Do not chew, crush, or split tablets before swallowing. 60 tablet 1  . albuterol (VENTOLIN HFA) 108 (90 Base) MCG/ACT inhaler Inhale 2 puffs into the lungs every 6 (six) hours as needed for wheezing or shortness of breath. 18 g 2  . amLODipine (NORVASC) 10 MG tablet TAKE 1 TABLET BY MOUTH EVERY DAY 90 tablet 1  . aspirin 81 MG tablet Take 81 mg by mouth daily.      . B-D INS SYRINGE 0.5CC/31GX5/16 31G X 5/16" 0.5 ML MISC USE AS DIRECTED 4 TIMES A DAY 300 each 1  . busPIRone (BUSPAR) 10 MG tablet Take 1 tablet (10 mg total) by mouth 3 (three) times daily. 270 tablet 1  . carvedilol (COREG) 25 MG tablet Take 1 tablet (25 mg total) by mouth 2 (two) times daily with a meal. 180 tablet 3  . cloNIDine (CATAPRES) 0.3 MG tablet TAKE 1 TABLET (0.3 MG TOTAL) BY MOUTH 2 (TWO) TIMES DAILY. 180 tablet 1  . exemestane (AROMASIN) 25 MG tablet TAKE 1 TABLET BY MOUTH ONCE A DAY AFTER BREAKFAST 30 tablet 1  . fluticasone (FLONASE) 50 MCG/ACT nasal spray Place 2 sprays into both nostrils daily. 16 g 3  . fluticasone furoate-vilanterol (BREO ELLIPTA) 200-25 MCG/INH AEPB Inhale 1 puff into the lungs daily. 60 each 3  . fluticasone furoate-vilanterol (BREO ELLIPTA) 200-25 MCG/INH AEPB  Inhale 1 puff into the lungs daily. 2 each 0  . furosemide (LASIX) 20 MG tablet TAKE 1 TABLET BY MOUTH EVERY DAY AS NEEDED 30 tablet 0  . HYDROcodone-acetaminophen (NORCO/VICODIN) 5-325 MG tablet Take 1 tablet by mouth every 8 (eight) hours as needed for moderate pain. 15 tablet 0  . Insulin Glargine (LANTUS SOLOSTAR) 100 UNIT/ML Solostar Pen Inject 28 units into the skin in am 10 pen 3  . Insulin Pen Needle 31G X 5 MM MISC Use pen needles for insulin injection daily 100 each 3  . insulin regular (NOVOLIN R) 100 units/mL injection Inject 0.14-0.18 mLs (14-18 Units total) into the skin 3 (three) times daily before meals. Relion. Please provide insulin syringes needed 20 mL 11  . Insulin Syringe-Needle U-100 (B-D INS SYRINGE 0.5CC/30GX1/2") 30G X 1/2" 0.5 ML MISC USE AS DIRECTED 4 TIMES A DAY 300 each 3  . lisinopril-hydrochlorothiazide (ZESTORETIC) 20-12.5 MG tablet Take 1 tablet by mouth daily. 90 tablet 3  . lovastatin (MEVACOR) 40 MG tablet TAKE 1 TABLET BY MOUTH EVERYDAY AT BEDTIME 90 tablet 3  . metFORMIN (GLUCOPHAGE) 500 MG tablet TAKE 2 TABLETS BY MOUTH DAILY WITH SUPPER. 180 tablet 2  . OVER THE COUNTER MEDICATION Take 1 tablet by mouth daily as needed. Stool softner for constipation, also drinks warm prune juice prn    . polyethylene glycol powder (GLYCOLAX/MIRALAX) 17 GM/SCOOP powder Take 17 g by mouth daily. 1 g 0  . spironolactone (ALDACTONE) 25 MG tablet Take 0.5 tablets (12.5 mg total) by mouth daily. 15 tablet 3  . traMADol (ULTRAM) 50 MG tablet TAKE 1 TABLET BY MOUTH EVERY 6 HOURS AS NEEDED FOR MODERATE/SEVERE PAIN (NECK AND SHOULDER PAIN). 20 tablet 0  . traZODone (DESYREL) 50 MG tablet Take 0.5-1 tablets (25-50 mg total) by mouth at bedtime as needed for sleep. 30 tablet 3   No current facility-administered medications for this visit.    PHYSICAL EXAMINATION: ECOG PERFORMANCE STATUS: 2 - Symptomatic, <50% confined to bed  Vitals:   12/11/19 1136  BP: 109/70  Pulse: 69    Resp: 17  Temp: 98.3 F (36.8 C)  SpO2: 100%   Filed Weights   12/11/19 1136  Weight: 194 lb 12.8 oz (88.4 kg)    Due to COVID19 we will limit examination to appearance. Patient had no complaints.  GENERAL:alert, no distress and comfortable SKIN: skin color normal, no rashes or significant lesions EYES: normal, Conjunctiva are pink and non-injected, sclera clear  NEURO: alert & oriented x 3 with fluent speech   LABORATORY DATA:  I have reviewed the data as listed CBC Latest Ref Rng & Units 12/11/2019 10/30/2019 10/07/2019  WBC 4.0 - 10.5 K/uL 5.4 8.3 7.9  Hemoglobin 12.0 - 15.0 g/dL 10.7(L) 11.8(L) 12.1  Hematocrit 36.0 - 46.0 % 31.8(L) 34.7(L) 36.8  Platelets 150 - 400 K/uL 277 333.0 318.0     CMP Latest Ref Rng & Units 12/11/2019 11/07/2019 10/07/2019  Glucose 70 - 99 mg/dL 143(H) 322(H) 206(H)  BUN 8 - 23 mg/dL 38(H) 31(H) 21  Creatinine 0.44 - 1.00 mg/dL 2.63(H) 1.66(H) 1.33(H)  Sodium 135 - 145 mmol/L 135 133(L) 141  Potassium 3.5 - 5.1 mmol/L 5.5(H) 4.4 4.0  Chloride 98 - 111 mmol/L 102 98 102  CO2 22 - 32 mmol/L _0 Calcium 8.9 - 10.3 mg/dL 9.2 8.9 9.9  Total Protein 6.5 - 8.1 g/dL 7.2 - 7.2  Total Bilirubin 0.3 - 1.2 mg/dL 0.4 - 0.3  Alkaline Phos 38 - 126 U/L 109 - 109  AST 15 - 41 U/L 9(L) - 10  ALT 0 - 44 U/L <6 - 5      RADIOGRAPHIC STUDIES: I have personally reviewed the radiological images as listed and agreed with the findings in the report. No results found.   ASSESSMENT & PLAN:  Wendy [name] is a 79 y.o. female with    1. Malignant neoplasm of upper-outer quadrant of left breast, Invasive Ductal Carcinoma, pT2pN1M0, stage IIB, ER+/PR-/HER2-, G3, mammaprint high risk, axillarynodesrecurrence in 06/2019, lung nodules in 10/2019 -She was initiallydiagnosed in 11/2016. She was treated with left lumpectomy and SLN sampling,re-excision surgery, Adjuvant TC for 4 cycles andadjuvantradiation.  -She was on adjuvant Letrozole from  06/2017-08/14/19 -Unfortunately she had local recurrence in left axilla as seen on 06/2019 Mammogram and confirmed with 06/2019 biopsy. Grade III, ER+/PR-/HER2-. HerBreastMRI and PET scan were negative for breast or distant metastasis.  -She underwent Left axillary lymph node dissection by Dr. Brantley Stage on 08/05/19.She had complete resection. Given the extent of LNs involvement her risk for breast cancer recurrence is very high.  -Given her prior chemo treatment,and her advanced age, I didnot recommend more chemoafter ALND -Her FO shows RET, CDK8, NOTCH1, TP53 mutations.No ER mutation. -She has developed dyspnea on exertion lately, seen by pulmonary, CT chest showed new growth of small lung nodules with thoracic lymphadenopathy on 10/31/19. This is highly suspicious for lung metastasis from her breast cancer. Unfortunately they are probably not accessible by biopsy  -She plans to proceed with PET scan on 12/19/19 -Given her h/o of recurrent breast cancer I recommend she continue Exemestane and added oral biological agent Verzenio 161m BID starting 12/04/19.  -She has tolerated the first week of Verzenio well with mild nausea which has resolved. No diarrhea. Labs reviewed and adequate to continue.  -f/u in 2 weeks with PET scan results.   2. Dry cough and SOB, Multiple Small Lung nodules.   -She has been having Dry Cough and SOB for the past few months and has progressed recently.  -Her PCP referred her to pulmonologist Dr.  Mannam -Patient denies smoking before but notes she has h/o of Tobacco snuff.  -Her CT angio from 10/31/19 shows multiple small nodules in her lung. Some of these were seen on 05/2019 PET scan but are now larger. There is also some enlarged LNs of chest.  -Given small size they are indeterminate and most are likely too small to biopsy or target at this point. Will obtain PET for further evaluation. She is agreeable.   -Her breathing approved on Breo given by her PCP. I encouraged  her to ask for financial assistance given high copay.  -She has received both her COVID19 vaccinations.   3. Left arm numbness/tinglingandLeft arm lymphedema, secondary to surgery. -secondary to malignant left axillary adenopathy, s/p ALND in 08/2019 -She still has left armand handlymphedema. I discussed her edema can improve but may have residual edema permanently. -She is currently doing PT 3 times a week, continue.   4. HTN, DM, CKD III -F/u with PCPand endocrinologist.  -DM is not well controlled.  I encouraged her to better control her DM as this can effects her kidney function.   5. Mild anemia -Iron and TIBC, B12, MMA normal -mildand stable.  6. Osteopenia  -09/2018 DEXA shows osteopeniawith lowest T-score -1.1 -Will monitor onAI. Next DEXA in 09/2020  7. Financial Support  -She paid $165 out of pocket for her exemestane.  -I encouraged her to contact financial advocate Shauna about applying for grant to cover or help cost of medication. She is interested.   Plan: -Labs reviewed, will continue Verzenio 124m BID  -PET scan on 11/21/19  -Continue Exemestane  -Lab and f/u in 2 weeks   No problem-specific Assessment & Plan notes found for this encounter.   No orders of the defined types were placed in this encounter.  All questions were answered. The patient knows to call the clinic with any problems, questions or concerns. No barriers to learning was detected. The total time spent in the appointment was 30 minutes.     YTruitt Merle MD 12/11/2019   I, AJoslyn Devon am acting as scribe for YTruitt Merle MD.   I have reviewed the above documentation for accuracy and completeness, and I agree with the above.

## 2019-12-04 NOTE — Telephone Encounter (Signed)
Patient called asking for samples for Wendy [name] - she stated that she is waiting for a paper application to be sent to her from [facility] and while this process is happening patient is asking for samples to last her.  Patient ph# [phone]

## 2019-12-05 ENCOUNTER — Telehealth: Payer: Self-pay | Admitting: Family Medicine

## 2019-12-05 NOTE — Telephone Encounter (Signed)
Okay to give her sample of the abdomen.  If not, we can give her any long-acting insulin we have.

## 2019-12-05 NOTE — Telephone Encounter (Signed)
Patient called again re: previous notes. Patient requests to be called at ph# [phone] re: needs samples asap.

## 2019-12-05 NOTE — Telephone Encounter (Signed)
Patient notified she can pick up sample of Tresiba.

## 2019-12-05 NOTE — Telephone Encounter (Signed)
Ok to refill for patient? ?

## 2019-12-05 NOTE — Telephone Encounter (Signed)
Yes thanks-you may refill before the weekend-please cc her endocrinologist so they can know it was addressed once sent in

## 2019-12-05 NOTE — Telephone Encounter (Signed)
Pt called stating she only has one dose left her of Lantus. Pt states she has called her diabetes doctor multiple times but has not heard back from them and has not received any refills. Pt is requesting a refill of Lantus. Please advise.

## 2019-12-05 NOTE — Telephone Encounter (Signed)
Called patient states that she did not want script she needs samples. I reviewed chart and seen where samples have been approved by endo for her to call office now to arrange time to pick up.

## 2019-12-08 ENCOUNTER — Ambulatory Visit: Payer: Medicare Other | Admitting: Podiatry

## 2019-12-10 ENCOUNTER — Ambulatory Visit (HOSPITAL_COMMUNITY): Payer: Medicare Other

## 2019-12-10 ENCOUNTER — Telehealth: Payer: Self-pay

## 2019-12-10 NOTE — Telephone Encounter (Signed)
Wendy [name] called this am stating that she had to reschedule her PET scan from this am to next week.  She stated her blood surgar was low.  I returned her call.   She states that her blood sugar was 58 this am. She states she is feeling nervous and jittery.  She is still experiencing dyspnea with exertion and she is having some dizziness.  She has been trying to contact her endocrinologist regarding her BS management.  She is keeping her appts with Dr. Burr Medico for tomorrow.

## 2019-12-11 ENCOUNTER — Encounter: Payer: Self-pay | Admitting: Hematology

## 2019-12-11 ENCOUNTER — Inpatient Hospital Stay: Payer: Medicare Other | Attending: Hematology

## 2019-12-11 ENCOUNTER — Other Ambulatory Visit: Payer: Self-pay

## 2019-12-11 ENCOUNTER — Telehealth: Payer: Self-pay | Admitting: Pulmonary Disease

## 2019-12-11 ENCOUNTER — Inpatient Hospital Stay (HOSPITAL_BASED_OUTPATIENT_CLINIC_OR_DEPARTMENT_OTHER): Payer: Medicare Other | Admitting: Hematology

## 2019-12-11 VITALS — BP 109/70 | HR 69 | Temp 98.3°F | Resp 17 | Ht 62.0 in | Wt 194.8 lb

## 2019-12-11 DIAGNOSIS — E1122 Type 2 diabetes mellitus with diabetic chronic kidney disease: Secondary | ICD-10-CM | POA: Insufficient documentation

## 2019-12-11 DIAGNOSIS — M8580 Other specified disorders of bone density and structure, unspecified site: Secondary | ICD-10-CM | POA: Diagnosis not present

## 2019-12-11 DIAGNOSIS — D649 Anemia, unspecified: Secondary | ICD-10-CM | POA: Insufficient documentation

## 2019-12-11 DIAGNOSIS — C50412 Malignant neoplasm of upper-outer quadrant of left female breast: Secondary | ICD-10-CM

## 2019-12-11 DIAGNOSIS — Z79811 Long term (current) use of aromatase inhibitors: Secondary | ICD-10-CM | POA: Insufficient documentation

## 2019-12-11 DIAGNOSIS — Z923 Personal history of irradiation: Secondary | ICD-10-CM | POA: Diagnosis not present

## 2019-12-11 DIAGNOSIS — Z170 Estrogen receptor positive status [ER+]: Secondary | ICD-10-CM | POA: Insufficient documentation

## 2019-12-11 DIAGNOSIS — N183 Chronic kidney disease, stage 3 unspecified: Secondary | ICD-10-CM | POA: Insufficient documentation

## 2019-12-11 DIAGNOSIS — I129 Hypertensive chronic kidney disease with stage 1 through stage 4 chronic kidney disease, or unspecified chronic kidney disease: Secondary | ICD-10-CM | POA: Insufficient documentation

## 2019-12-11 DIAGNOSIS — C773 Secondary and unspecified malignant neoplasm of axilla and upper limb lymph nodes: Secondary | ICD-10-CM | POA: Diagnosis not present

## 2019-12-11 LAB — CBC WITH DIFFERENTIAL/PLATELET
Abs Immature Granulocytes: 0.04 10*3/uL (ref 0.00–0.07)
Basophils Absolute: 0 10*3/uL (ref 0.0–0.1)
Basophils Relative: 0 %
Eosinophils Absolute: 0.1 10*3/uL (ref 0.0–0.5)
Eosinophils Relative: 2 %
HCT: 31.8 % — ABNORMAL LOW (ref 36.0–46.0)
Hemoglobin: 10.7 g/dL — ABNORMAL LOW (ref 12.0–15.0)
Immature Granulocytes: 1 %
Lymphocytes Relative: 19 %
Lymphs Abs: 1 10*3/uL (ref 0.7–4.0)
MCH: 27.6 pg (ref 26.0–34.0)
MCHC: 33.6 g/dL (ref 30.0–36.0)
MCV: 82 fL (ref 80.0–100.0)
Monocytes Absolute: 0.2 10*3/uL (ref 0.1–1.0)
Monocytes Relative: 3 %
Neutro Abs: 4 10*3/uL (ref 1.7–7.7)
Neutrophils Relative %: 75 %
Platelets: 277 10*3/uL (ref 150–400)
RBC: 3.88 MIL/uL (ref 3.87–5.11)
RDW: 16.5 % — ABNORMAL HIGH (ref 11.5–15.5)
WBC: 5.4 10*3/uL (ref 4.0–10.5)
nRBC: 0 % (ref 0.0–0.2)

## 2019-12-11 LAB — COMPREHENSIVE METABOLIC PANEL
ALT: <6 U/L (ref 0–44)
AST: 9 U/L — ABNORMAL LOW (ref 15–41)
Albumin: 3.6 g/dL (ref 3.5–5.0)
Alkaline Phosphatase: 109 U/L (ref 38–126)
Anion gap: 8 (ref 5–15)
BUN: 38 mg/dL — ABNORMAL HIGH (ref 8–23)
CO2: 25 mmol/L (ref 22–32)
Calcium: 9.2 mg/dL (ref 8.9–10.3)
Chloride: 102 mmol/L (ref 98–111)
Creatinine, Ser: 2.63 mg/dL — ABNORMAL HIGH (ref 0.44–1.00)
GFR calc Af Amer: 19 mL/min — ABNORMAL LOW (ref >60)
GFR calc non Af Amer: 17 mL/min — ABNORMAL LOW (ref >60)
Glucose, Bld: 143 mg/dL — ABNORMAL HIGH (ref 70–99)
Potassium: 5.5 mmol/L — ABNORMAL HIGH (ref 3.5–5.1)
Sodium: 135 mmol/L (ref 135–145)
Total Bilirubin: 0.4 mg/dL (ref 0.3–1.2)
Total Protein: 7.2 g/dL (ref 6.5–8.1)

## 2019-12-11 NOTE — Telephone Encounter (Signed)
  Assessment:  Asthma, dyspnea She does have elevated peripheral eosinophils suggestive of allergies, asthma.  History also notable for significant mold exposure but no ILD on CT scan.  Borderline positive ANA is likely non significant.  She has responded well to [facility] but has problems with the cost of medication Referral to pharmacy to check for cheaper alternative Awaiting PFTs.  Plan/Recommendations: - Continue inhalers, pharmacy referral - Awaiting PFTs.  Called and spoke with pt's daughter Wendy [name] in regards to pt's last OV. Stated to her that Dr. Vaughan Browner wanted her to have referral to our pharmacy team so we could see if there was a cheaper alternative to breo for pt and she verbalized understanding. Pt has been scheduled an appt with pharmacy clinic Monday 3/15 at 11am. Nothing further needed.

## 2019-12-12 ENCOUNTER — Telehealth: Payer: Self-pay | Admitting: Hematology

## 2019-12-12 ENCOUNTER — Telehealth: Payer: Self-pay

## 2019-12-12 ENCOUNTER — Telehealth: Payer: Self-pay | Admitting: Pharmacy Technician

## 2019-12-12 LAB — CANCER ANTIGEN 27.29: CA 27.29: 27.7 U/mL (ref 0.0–38.6)

## 2019-12-12 NOTE — Telephone Encounter (Signed)
Oral Oncology Patient Advocate Encounter  Met patient in [place] to complete an application for [employer] Patient Assistance Program in an effort to reduce the patient's out of pocket expense for Verzenio to $0.    Application completed and faxed to [phone].   [employer] patient assistance phone number for follow up is [phone].   This encounter will be updated until final determination.      [place] Patient [place] Phone [phone] Fax [phone] 12/12/2019 3:42 PM

## 2019-12-12 NOTE — Telephone Encounter (Signed)
Findings of benefits investigation via test claims at [facility]:   Insurance: [place] Medicare D   Breo - # 1 inhaler for a 1 month supply through patient's insurance is $ 282.96.  Advair Diskus - # 1 inhaler for a 1 month supply through patient's insurance is $ 282.96.  Symbicort - # 1 inhaler for a 1 month supply through patient's insurance is $ 282.96.  Advair HFA - # 1 inhaler for a 1 month supply through patient's insurance is $ 282.96.  Airduo and Dulera are non-formulary.  *Patient has a $365 pharmacy deductible remaining. Once met, patient's copays will be $47 for 1 month supply.  10:01 AM Beatriz Chancellor, CPhT

## 2019-12-12 NOTE — Telephone Encounter (Signed)
Scheduled appt per 3/11 los.  Spoke with pt and she is aware of the appt date and time.

## 2019-12-12 NOTE — Progress Notes (Signed)
HPI Patient was last seen and referred by Dr. Vaughan Browner on 12/03/19 for inhaler optimization. Dr. Vaughan Browner stated that patient has responded well to [facility], however, has had issues with cost of medication.  Past medical history includes HTN, HLD, T2DM, CKD, HF, seasonal allergies, and breast cancer.    Findings of benefits investigation via test claims at [facility]: --Breo - # 1 inhaler for a 1 month supply through patient's insurance is $ 282.96. --Advair Diskus - # 1 inhaler for a 1 month supply through patient's insurance is $ 282.96. --Symbicort - # 1 inhaler for a 1 month supply through patient's insurance is $ 282.96. --Advair HFA - # 1 inhaler for a 1 month supply through patient's insurance is $ 282.96. --Airduo and Ruthe Mannan are non-formulary. *Patient has a $365 pharmacy deductible remaining. Once met, patient's copays will be $47 for 1 month supply.  Patient presents for initial appt with pharmacy team. Patient denies hospitalizations due to exacerbation of dyspnea and denies pneumonia in the past year. She states that she recently ran out of Breo samples and is unable to obtain [facility] prescription due to cost. She reports she cannot afford to pay deductible. She has noticed her breathing has worsened since she stopped using Breo and states she has been more "wheezy" lately. She notes when she does use Breo Ellipta she notices her breathing is better controlled. She makes $1700 per month from social security, which equates to a total annual income of $20,400. She lives in a 2 person household with her son. She currently helps provide for her son (he is ~53 years old) since he lost his job (previously worked at [employer]). Her husband passed away last year. She gets most of her medications via patient assistance programs.   Respiratory Medications Current: Breo Ellipta 200 mcg, albuterol prn (uses 2 puffs daily) Tried in past: None Patient reports adherence challenges due to costs of  medications  OBJECTIVE No Known Allergies  Outpatient Encounter Medications as of 12/15/2019  Medication Sig  . abemaciclib (VERZENIO) 150 MG tablet Take 1 tablet (150 mg total) by mouth 2 (two) times daily. Swallow tablets whole. Do not chew, crush, or split tablets before swallowing.  . albuterol (VENTOLIN HFA) 108 (90 Base) MCG/ACT inhaler Inhale 2 puffs into the lungs every 6 (six) hours as needed for wheezing or shortness of breath.  Marland Kitchen amLODipine (NORVASC) 10 MG tablet TAKE 1 TABLET BY MOUTH EVERY DAY  . aspirin 81 MG tablet Take 81 mg by mouth daily.    . B-D INS SYRINGE 0.5CC/31GX5/16 31G X 5/16" 0.5 ML MISC USE AS DIRECTED 4 TIMES A DAY  . busPIRone (BUSPAR) 10 MG tablet Take 1 tablet (10 mg total) by mouth 3 (three) times daily.  . carvedilol (COREG) 25 MG tablet Take 1 tablet (25 mg total) by mouth 2 (two) times daily with a meal.  . cloNIDine (CATAPRES) 0.3 MG tablet TAKE 1 TABLET (0.3 MG TOTAL) BY MOUTH 2 (TWO) TIMES DAILY.  Marland Kitchen exemestane (AROMASIN) 25 MG tablet TAKE 1 TABLET BY MOUTH ONCE A DAY AFTER BREAKFAST  . fluticasone (FLONASE) 50 MCG/ACT nasal spray Place 2 sprays into both nostrils daily.  . fluticasone furoate-vilanterol (BREO ELLIPTA) 200-25 MCG/INH AEPB Inhale 1 puff into the lungs daily.  . fluticasone furoate-vilanterol (BREO ELLIPTA) 200-25 MCG/INH AEPB Inhale 1 puff into the lungs daily.  . furosemide (LASIX) 20 MG tablet TAKE 1 TABLET BY MOUTH EVERY DAY AS NEEDED  . HYDROcodone-acetaminophen (NORCO/VICODIN) 5-325 MG tablet  Take 1 tablet by mouth every 8 (eight) hours as needed for moderate pain.  . Insulin Glargine (LANTUS SOLOSTAR) 100 UNIT/ML Solostar Pen Inject 28 units into the skin in am  . Insulin Pen Needle 31G X 5 MM MISC Use pen needles for insulin injection daily  . insulin regular (NOVOLIN R) 100 units/mL injection Inject 0.14-0.18 mLs (14-18 Units total) into the skin 3 (three) times daily before meals. Relion. Please provide insulin syringes needed  .  Insulin Syringe-Needle U-100 (B-D INS SYRINGE 0.5CC/30GX1/2") 30G X 1/2" 0.5 ML MISC USE AS DIRECTED 4 TIMES A DAY  . lisinopril-hydrochlorothiazide (ZESTORETIC) 20-12.5 MG tablet Take 1 tablet by mouth daily.  Marland Kitchen lovastatin (MEVACOR) 40 MG tablet TAKE 1 TABLET BY MOUTH EVERYDAY AT BEDTIME  . metFORMIN (GLUCOPHAGE) 500 MG tablet TAKE 2 TABLETS BY MOUTH DAILY WITH SUPPER.  Marland Kitchen OVER THE COUNTER MEDICATION Take 1 tablet by mouth daily as needed. Stool softner for constipation, also drinks warm prune juice prn  . polyethylene glycol powder (GLYCOLAX/MIRALAX) 17 GM/SCOOP powder Take 17 g by mouth daily.  Marland Kitchen spironolactone (ALDACTONE) 25 MG tablet Take 0.5 tablets (12.5 mg total) by mouth daily.  . traMADol (ULTRAM) 50 MG tablet TAKE 1 TABLET BY MOUTH EVERY 6 HOURS AS NEEDED FOR MODERATE/SEVERE PAIN (NECK AND SHOULDER PAIN).  Marland Kitchen traZODone (DESYREL) 50 MG tablet Take 0.5-1 tablets (25-50 mg total) by mouth at bedtime as needed for sleep.   No facility-administered encounter medications on file as of 12/15/2019.     Immunization History  Administered Date(s) Administered  . Fluad Quad(high Dose 65+) 07/24/2019  . Influenza Split 07/06/2011, 10/25/2012, 06/07/2018  . Influenza Whole 07/06/2008, 08/30/2009, 09/15/2010  . Influenza, High Dose Seasonal PF 07/10/2017  . Influenza,inj,Quad PF,6+ Mos 07/03/2013, 06/16/2015, 10/06/2016  . Influenza-Unspecified 07/04/2019  . Pneumococcal Conjugate-13 07/12/2015  . Pneumococcal Polysaccharide-23 08/03/1999, 12/12/2011  . Td 03/03/1999     PFTs PFT Results Latest Ref Rng & Units 10/16/2018  FVC-Pre L 1.41  FVC-Predicted Pre % 73  FVC-Post L 1.49  FVC-Predicted Post % 78  Pre FEV1/FVC % % 76  Post FEV1/FCV % % 77  FEV1-Pre L 1.07  FEV1-Predicted Pre % 73  FEV1-Post L 1.15     Eosinophils Most recent blood eosinophil count was 100 cells/mL taken on 12/11/2019. However, previously (09/11/2019) absolute eosinophil count was 780 cells/mL   IgE (RAST  profile 10/30/2019) 268, sensitive to grass cockroach, tree pollen  Assessment   1. Inhaler Optimization  PIFR Inspiratory flow measured using the In-check DIAL G16 and was in range of 30-90 for use of Medium DPI device (Ellipta). Patient scored 90 upon retraining (originally patient scored > 90).  Optimal inhaler for patient would be to continue Breo Ellipta considering PIFR score, patient reported efficacy with prior use, IgE level, and prior elevated eosinophil level. Cost is a barrier for patient obtaining Breo Ellipta. Patient is unable to pay pharmacy deductible and will not qualify for [place] since she cannot meet $600 OOP. It is likely patient will not qualify for AZ&me either considering she does not pay 3% of her income OOP. Patient will qualify for for BICares, however, the only inhalers available via BICares are Stiolto Respimat and Spiriva Respimat. Patient requires ICS considering prior elevated IgE and eosinophils. A future option could be to pursue Spiriva Respimat via BICares and prescribe budesonide nebs. Patient is not currently on nebs therefore would likely have to purchase a nebulizer device. Determined patient is likely eligible for Medicaid via [email or web address]. Patient  confirms she has a granddaughter and her husband's girlfriend who have computers and can help her fill out Medicaid application via [email or web address]. Also provided patient with Medicaid phone number to discuss any questions/concerns with Medicaid representative. Provided patient with 6 weeks of Breo Ellipta 200 mcg samples to bridge her in the interim timeframe while waiting Medicaid application response. Advised patient to contact clinic if she needs additional samples.  Patient was counseled on the purpose, proper use, and adverse effects of Ellipta inhaler.  Instructed patient to rinse mouth with water after using in order to prevent fungal infection.  Patient verbalized understanding.  Reviewed appropriate  use of maintenance vs rescue inhalers.  Stressed importance of using maintenance inhaler daily and rescue inhaler only as needed.  Patient verbalized understanding.  Demonstrated proper inhaler technique using Ellipta demo inhaler.  Patient able to demonstrate proper inhaler technique using teach back method. Patient was given sample in office today (patient given three samples, LOT 645R, EXP 06/2020).    2. Medication Reconciliation  A drug regimen assessment was performed, including review of allergies, interactions, disease-state management, dosing and immunization history. Medications were reviewed with the patient, including name, instructions, indication, goals of therapy, potential side effects, importance of adherence, and safe use.  Drug interaction(s): no concerning interactions identified  3. Immunizations  Patient is indicated for the shingles vaccination. Unable to discuss at current appt due to time constraints. Patient likely cannot afford Shingrix vaccination at the moment. Will address with patient once Medicaid application is approved.  PLAN 1. Apply to Medicaid to assist with costs of medications and healthcare costs. Provided patient with instructions on how to apply. 2. Continue Breo Ellipta 200 mcg one puff daily. 3. Follow up in 1 month to re-assess Medicaid application status.   All questions encouraged and answered.  Instructed patient to reach out with any further questions or concerns.  Thank you for allowing pharmacy to participate in this patient's care.  This appointment required 45 minutes of patient care (this includes precharting, chart review, review of results, face-to-face care, etc.).  Drexel Iha, PharmD PGY2 Ambulatory Care Pharmacy Resident

## 2019-12-15 ENCOUNTER — Ambulatory Visit (INDEPENDENT_AMBULATORY_CARE_PROVIDER_SITE_OTHER): Payer: Medicare Other | Admitting: Pharmacist

## 2019-12-15 ENCOUNTER — Other Ambulatory Visit: Payer: Self-pay

## 2019-12-15 DIAGNOSIS — Z79899 Other long term (current) drug therapy: Secondary | ICD-10-CM

## 2019-12-15 MED FILL — EXEMESTANE 25 MG TABLET: 25 | 30 days supply | Qty: 30 | Fill #1

## 2019-12-15 NOTE — Patient Instructions (Addendum)
It was a pleasure seeing you in clinic today Mrs. Wendy [name]!  Today the plan is... 1. Apply for Medicaid. You can apply online at the following website [email or web address] . You can call [phone] for additional questions/concerns.  2. Continue to use Breo Ellipta one puff daily. Please remember to rinse your mouth out afterwards! Also, refer to the printed handout. If you have additional concerns please contact a clinical pharmacist at [place].  Please call the PharmD clinic at [phone] if you have any questions that you would like to speak with a pharmacist about Wendy [name], Museum/gallery conservator).

## 2019-12-16 ENCOUNTER — Ambulatory Visit: Payer: Medicare Other | Admitting: Family Medicine

## 2019-12-17 ENCOUNTER — Telehealth: Payer: Self-pay

## 2019-12-17 NOTE — Telephone Encounter (Signed)
Oral Oncology Patient Advocate Encounter   Was successful in securing patient a $8000 grant from [employer] [place] (PAF) to provide copayment coverage for Verzenio.  This will keep the out of pocket expense at $0.     I have spoken with the patient.    The billing information is as follows and has been shared with [place]: [id number] PCN:  [id number] Member ID: [id number] Group ID: [id number] Dates of Eligibility: 12/16/19 through 12/15/20  [place] [facility] [place] Phone [phone] Fax [phone] 12/17/2019 11:12 AM

## 2019-12-17 NOTE — Telephone Encounter (Signed)
Cancelled application to [employer]. Obtained a grant from [facility] for patient.

## 2019-12-18 MED FILL — VERZENIO 150 MG TAB: 150 | 28 days supply | Qty: 56 | Fill #1

## 2019-12-19 ENCOUNTER — Telehealth: Payer: Self-pay | Admitting: Internal Medicine

## 2019-12-19 ENCOUNTER — Ambulatory Visit (HOSPITAL_COMMUNITY): Payer: Medicare Other

## 2019-12-19 NOTE — Telephone Encounter (Signed)
Patient requests to be called at ph# [phone] re: status of paperwork for [employer] so that patient can get her Trulicity. Patient is almost out of the medication listed above. Patient would also like to know if she can come in to get a sample of Trulicity until she receives her RX for Trulicity.

## 2019-12-22 NOTE — Telephone Encounter (Addendum)
Notified patient she can pick up samples along with the new [employer] paperwork. Medication is [place]

## 2019-12-22 NOTE — Telephone Encounter (Signed)
Patient calling again requesting a sample of Trulicity. [phone]

## 2019-12-23 ENCOUNTER — Other Ambulatory Visit: Payer: Self-pay

## 2019-12-23 ENCOUNTER — Ambulatory Visit: Payer: Medicare Other | Admitting: Sports Medicine

## 2019-12-23 ENCOUNTER — Telehealth: Payer: Self-pay | Admitting: Hematology

## 2019-12-23 ENCOUNTER — Telehealth: Payer: Self-pay

## 2019-12-23 DIAGNOSIS — C50412 Malignant neoplasm of upper-outer quadrant of left female breast: Secondary | ICD-10-CM

## 2019-12-23 DIAGNOSIS — R110 Nausea: Secondary | ICD-10-CM

## 2019-12-23 DIAGNOSIS — Z170 Estrogen receptor positive status [ER+]: Secondary | ICD-10-CM

## 2019-12-23 MED ORDER — PROCHLORPERAZINE MALEATE 10 MG PO TABS: 10.0000 mg | ORAL_TABLET | Freq: Four times a day (QID) | ORAL | 2 refills | Status: DC | PRN

## 2019-12-23 NOTE — Telephone Encounter (Signed)
Her nausea is likely related to Ibrance, than Exemestane. Yes, please call in compazine for her, thanks   [name] MD

## 2019-12-23 NOTE — Telephone Encounter (Signed)
Wendy [name] called stating that she gets nauseated after taking the exemastine.  She states that sometimes she takes it after eating and sometimes on an empty stomach.  I instructed her to take it after eating.   Is it OK to rx compazine for her to take before.

## 2019-12-23 NOTE — Telephone Encounter (Signed)
R/s appt per 3/23 sch message - pt is aware of appt date and time

## 2019-12-24 ENCOUNTER — Telehealth: Payer: Self-pay | Admitting: Internal Medicine

## 2019-12-24 NOTE — Telephone Encounter (Signed)
Patient requests to be called at ph# [phone] re: patient is not sure how much [name] she should take. Patient has a new pen.

## 2019-12-25 NOTE — Telephone Encounter (Signed)
Spoke to patient and clarified instructions.

## 2019-12-26 ENCOUNTER — Other Ambulatory Visit: Payer: Medicare Other

## 2019-12-26 ENCOUNTER — Ambulatory Visit: Payer: Medicare Other | Admitting: Hematology

## 2019-12-29 ENCOUNTER — Other Ambulatory Visit: Payer: Self-pay

## 2019-12-29 ENCOUNTER — Encounter: Payer: Self-pay | Admitting: Internal Medicine

## 2019-12-29 ENCOUNTER — Ambulatory Visit (INDEPENDENT_AMBULATORY_CARE_PROVIDER_SITE_OTHER): Payer: Medicare Other | Admitting: Internal Medicine

## 2019-12-29 ENCOUNTER — Telehealth: Payer: Self-pay | Admitting: Family Medicine

## 2019-12-29 DIAGNOSIS — E669 Obesity, unspecified: Secondary | ICD-10-CM

## 2019-12-29 DIAGNOSIS — E785 Hyperlipidemia, unspecified: Secondary | ICD-10-CM | POA: Diagnosis not present

## 2019-12-29 DIAGNOSIS — IMO0002 Reserved for concepts with insufficient information to code with codable children: Secondary | ICD-10-CM

## 2019-12-29 DIAGNOSIS — E1165 Type 2 diabetes mellitus with hyperglycemia: Secondary | ICD-10-CM

## 2019-12-29 DIAGNOSIS — Z794 Long term (current) use of insulin: Secondary | ICD-10-CM | POA: Diagnosis not present

## 2019-12-29 DIAGNOSIS — E113419 Type 2 diabetes mellitus with severe nonproliferative diabetic retinopathy with macular edema, unspecified eye: Secondary | ICD-10-CM

## 2019-12-29 NOTE — Progress Notes (Signed)
Patient ID: Wendy [name], female   DOB: 10/04/1940, 79 y.o.   MRN: [id number]  Patient location: Home My location: Office Persons participating in the virtual visit: patient, provider  Referring Provider: Marin Olp, MD  I connected with the patient on 12/29/19 at  2:26 PM EDT by telephone and verified that I am speaking with the correct person.   I discussed the limitations of evaluation and management by telephone and the availability of in person appointments. The patient expressed understanding and agreed to proceed.   Details of the encounter are shown below.  HPI: Wendy [name] is a 79 y.o.-year-old female, returning for f/u for DM2, dx in 1980s, insulin-dependent since 1990, uncontrolled, with complications (CKD, DR). Last visit 4.5 months ago (virtual visit).  She is undergoing ChTx >> nausea >> low appetite >> sugars low. Her nausea resolved now after she was given an antiemetic.  Reviewed HbA1c levels: Lab Results  Component Value Date   HGBA1C 8.5 (H) 11/07/2019   HGBA1C 8.4 (A) 07/24/2019   HGBA1C 8.8 (H) 04/01/2019    Pt is on: - Metformin 1000 >> 500 mg 2x a day with meals - Lantus 28 units in am - Novolin R before a meal: -10 units with a smaller meal -16 units with a regular meal  Lantus costs her 149$ per box >> she applied for financial assistance to get this free.   Pt checks her sugars 4-6 times a day: - am: 123, 241-278 >> 70 x2, 120, 177-216 >> 98-117 - 2h after b'fast:  n/c >> 220-344 >> 352 >> n/c - before lunch: 280 >> 237-241 >> 141-216 >> 130-140 - 2h after lunch: 236-335, 460 >> 310-345 >> 207-300 >> 90-160 - before dinner:  200 >> 81, 170-275 >> n/c >> 135-150 - 2h after dinner: 300-344 >> 153-300s, 422 >> 140-150 - bedtime: 180, 244-387 >> 275, 393, 394, 407 >> same - nighttime: 312 >> 4 am: 140-190 >> 153-253 >> n/c Lowest sugar was 32 >> ... 70 >> 90; she has hypoglycemia awareness in the 70s. Highest sugar was 400s >> .Marland Kitchen.300 >>  160.  Glucometer: [employer]  Pt's meals are: - Breakfast: toast, egg, bacon/sausage; oatmeal - Lunch: [place] sandwich, soups, crackers - Dinner: baked chicken, potatoes, cabbage (dinner is her largest meal) - Snacks: PB and crackers  -+ CKD, last BUN/creatinine:  Lab Results  Component Value Date   BUN 38 (H) 12/11/2019   CREATININE 2.63 (H) 12/11/2019  On lisinopril. -+ HL; last set of lipids: Lab Results  Component Value Date   CHOL 183 04/01/2019   HDL 55.70 04/01/2019   LDLCALC 93 04/01/2019   LDLDIRECT 140.0 09/12/2018   TRIG 171.0 (H) 04/01/2019   CHOLHDL 3 04/01/2019  On lovastatin 40. 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  She has a history of laser surgery. She usually sees Dr. Kathlen Mody.   -+ Neuropathic pain in her feet On ASA 81.  She has breast cancer (diagnosed 2018) and had left breast lumpectomy + chemotherapy for 4 weeks + radiotherapy.   She had left axillary lymph node dissection in 2020 >> positive lymph nodes.  Her husband died 05-28-2018.  They have been married for 55 years.  She started Buspar to help with grief/insomnia/anxiety.  ROS:  Constitutional: no weight gain/no weight loss, no fatigue, no subjective hyperthermia, no subjective hypothermia Eyes: no blurry vision, no xerophthalmia ENT: no sore throat, no nodules palpated in neck, no dysphagia, no  odynophagia, no hoarseness Cardiovascular: no CP/no SOB/no palpitations/no leg swelling Respiratory: no cough/no SOB/no wheezing Gastrointestinal: + N/no V/no D/no C/no acid reflux Musculoskeletal: no muscle aches/no joint aches Skin: no rashes, no hair loss Neurological: + tremors/no numbness/no tingling/no dizziness  I reviewed pt's medications, allergies, PMH, social hx, family hx, and changes were documented in the history of present illness. Otherwise, unchanged from my initial visit note.  Past Medical History:  Diagnosis Date  . Abnormal breast finding 2018   per  pt/ having  a lot drainage from left breast nipple  . Anemia   . Anxiety   . Breast cancer ([place]) 11/2016   left/  . CKD (chronic kidney disease)   . Depression   . GERD (gastroesophageal reflux disease)    TUMS as needed  . HTN (hypertension)    states BP has been high recently; has been on med. x 20 yr.  . Hyperlipidemia   . Hyperplastic colon polyp   . HYPERTENSION, BENIGN SYSTEMIC 11/29/2006   Lisinopril hctz 10-12.5mg , amlodipine 10mg , coreg 25mg  BID, clonidine 0.3 mg BID.   Marland Kitchen Insulin dependent diabetes mellitus   . Personal history of chemotherapy    2018/finished 6 weeks of chemo in Sep 2018  . Personal history of radiation therapy    2018 left breast/finished radiation in Sept 2018 per pt.   Past Surgical History:  Procedure Laterality Date  . AXILLARY LYMPH NODE DISSECTION Left 08/05/2019   Procedure: LEFT AXILLARY LYMPH NODE DISSECTION;  Surgeon: Erroll Luna, MD;  Location: [facility];  Service: General;  Laterality: Left;  . BREAST LUMPECTOMY Left 05/05/2008  . BREAST LUMPECTOMY Left 12/07/2016   malignant  . BREAST LUMPECTOMY WITH RADIOACTIVE SEED AND SENTINEL LYMPH NODE BIOPSY Left 12/07/2016   Procedure: LEFT BREAST LUMPECTOMY WITH RADIOACTIVE SEED AND SENTINEL LYMPH NODE BIOPSY;  Surgeon: Erroll Luna, MD;  Location: [facility];  Service: General;  Laterality: Left;  . IR FLUORO GUIDE PORT INSERTION RIGHT  01/04/2017  . IR US GUIDE VASC ACCESS RIGHT  01/04/2017  . PORT-A-CATH REMOVAL N/A 05/30/2017   Procedure: REMOVAL PORT-A-CATH;  Surgeon: Erroll Luna, MD;  Location: [facility];  Service: General;  Laterality: N/A;  . RE-EXCISION OF BREAST LUMPECTOMY Left 12/28/2016   Procedure: RE-EXCISION OF BREAST LUMPECTOMY;  Surgeon: Erroll Luna, MD;  Location: [facility];  Service: General;  Laterality: Left;   Social History   Social History  . Marital Status: Married    Spouse Name: N/A  . Number of Children: 4   . retired     Social History Main Topics  . Smoking status: Never Smoker   . Smokeless tobacco: Former Systems developer    Types: Snuff    Quit date: 10/02/2014     Comment: Quit 10/02/2014  previous 1 box per week of snuff   . Alcohol Use: No  . Drug Use: No   Current Outpatient Medications on File Prior to Visit  Medication Sig Dispense Refill  . abemaciclib (VERZENIO) 150 MG tablet Take 1 tablet (150 mg total) by mouth 2 (two) times daily. Swallow tablets whole. Do not chew, crush, or split tablets before swallowing. 60 tablet 1  . albuterol (VENTOLIN HFA) 108 (90 Base) MCG/ACT inhaler Inhale 2 puffs into the lungs every 6 (six) hours as needed for wheezing or shortness of breath. 18 g 2  . amLODipine (NORVASC) 10 MG tablet TAKE 1 TABLET BY MOUTH EVERY DAY 90 tablet 1  . aspirin 81 MG tablet  Take 81 mg by mouth daily.      . B-D INS SYRINGE 0.5CC/31GX5/16 31G X 5/16" 0.5 ML MISC USE AS DIRECTED 4 TIMES A DAY 300 each 1  . busPIRone (BUSPAR) 10 MG tablet Take 1 tablet (10 mg total) by mouth 3 (three) times daily. 270 tablet 1  . carvedilol (COREG) 25 MG tablet Take 1 tablet (25 mg total) by mouth 2 (two) times daily with a meal. 180 tablet 3  . cloNIDine (CATAPRES) 0.3 MG tablet TAKE 1 TABLET (0.3 MG TOTAL) BY MOUTH 2 (TWO) TIMES DAILY. 180 tablet 1  . exemestane (AROMASIN) 25 MG tablet TAKE 1 TABLET BY MOUTH ONCE A DAY AFTER BREAKFAST 30 tablet 1  . fluticasone (FLONASE) 50 MCG/ACT nasal spray Place 2 sprays into both nostrils daily. 16 g 3  . fluticasone furoate-vilanterol (BREO ELLIPTA) 200-25 MCG/INH AEPB Inhale 1 puff into the lungs daily. (Patient not taking: Reported on 12/15/2019) 60 each 3  . fluticasone furoate-vilanterol (BREO ELLIPTA) 200-25 MCG/INH AEPB Inhale 1 puff into the lungs daily. (Patient not taking: Reported on 12/15/2019) 2 each 0  . HYDROcodone-acetaminophen (NORCO/VICODIN) 5-325 MG tablet Take 1 tablet by mouth every 8 (eight) hours as needed for moderate pain. 15 tablet 0  . Insulin  Glargine (LANTUS SOLOSTAR) 100 UNIT/ML Solostar Pen Inject 28 units into the skin in am (Patient taking differently: 26 Units. Inject 28 units into the skin in am) 10 pen 3  . Insulin Pen Needle 31G X 5 MM MISC Use pen needles for insulin injection daily 100 each 3  . insulin regular (NOVOLIN R) 100 units/mL injection Inject 0.14-0.18 mLs (14-18 Units total) into the skin 3 (three) times daily before meals. Relion. Please provide insulin syringes needed (Patient taking differently: Inject 10-16 Units into the skin 3 (three) times daily before meals. Relion. Please provide insulin syringes needed) 20 mL 11  . Insulin Syringe-Needle U-100 (B-D INS SYRINGE 0.5CC/30GX1/2") 30G X 1/2" 0.5 ML MISC USE AS DIRECTED 4 TIMES A DAY 300 each 3  . lisinopril-hydrochlorothiazide (ZESTORETIC) 20-12.5 MG tablet Take 1 tablet by mouth daily. 90 tablet 3  . lovastatin (MEVACOR) 40 MG tablet TAKE 1 TABLET BY MOUTH EVERYDAY AT BEDTIME 90 tablet 3  . metFORMIN (GLUCOPHAGE) 500 MG tablet TAKE 2 TABLETS BY MOUTH DAILY WITH SUPPER. (Patient taking differently: 500 mg 2 (two) times daily with a meal. ) 180 tablet 2  . OVER THE COUNTER MEDICATION Take 1 tablet by mouth daily as needed. Stool softner for constipation, also drinks warm prune juice prn    . polyethylene glycol powder (GLYCOLAX/MIRALAX) 17 GM/SCOOP powder Take 17 g by mouth daily. 1 g 0  . prochlorperazine (COMPAZINE) 10 MG tablet Take 1 tablet (10 mg total) by mouth every 6 (six) hours as needed for nausea or vomiting. 30 tablet 2  . spironolactone (ALDACTONE) 25 MG tablet Take 0.5 tablets (12.5 mg total) by mouth daily. 15 tablet 3  . traMADol (ULTRAM) 50 MG tablet TAKE 1 TABLET BY MOUTH EVERY 6 HOURS AS NEEDED FOR MODERATE/SEVERE PAIN (NECK AND SHOULDER PAIN). 20 tablet 0  . traZODone (DESYREL) 50 MG tablet Take 0.5-1 tablets (25-50 mg total) by mouth at bedtime as needed for sleep. (Patient not taking: Reported on 12/15/2019) 30 tablet 3   No current  facility-administered medications on file prior to visit.   No Known Allergies Family History  Problem Relation Age of Onset  . Diabetes Sister        x 4  .  Heart failure Sister   . Diabetes Brother   . Heart disease Brother   . Diabetes Paternal Grandmother   . Diabetes Paternal Aunt   . Heart attack Brother   . Arthritis Sister   . Hypertension Sister   . Diabetes Sister   . Emphysema Brother   . Cancer Father 21       brain tumor   . Diabetes Child   . Asthma Child   . Hypertension Child   . Diabetes Child   . Diabetes Child   . Colon cancer Neg Hx   . Stomach cancer Neg Hx    PE: LMP  (LMP Unknown)  There is no height or weight on file to calculate BMI. Wt Readings from Last 3 Encounters:  12/11/19 194 lb 12.8 oz (88.4 kg)  12/03/19 190 lb 12.8 oz (86.5 kg)  11/07/19 197 lb 6.4 oz (89.5 kg)   Constitutional:  in NAD  The physical exam was not performed (phone visit).  ASSESSMENT: 1. 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 HL  3. Obesity  PLAN:  1.  Patient with longstanding, uncontrolled, type 2 diabetes, on basal-bolus insulin regimen and also Metformin.  She has a history of noncompliance with the recommended regimen due to finances.  In the past, we discussed about a more affordable regimen of NPH and regular insulin from home but she applied for financial assistance and she is now on Lantus.  She uses Novolin R for short-acting insulin. -At last visit, HbA1c was slightly lower, at 8.4%, decreased from 8.8%.  Reviewing her regimen, she did not increase the doses of insulin as advised that sugars were still high, only slightly better than before.  She still had sugars in the high 200s and 300s anymore in the 400s.  At that time she was using a full mealtime dose to correct high blood sugars at bedtime and we discussed about only using few units if needed.  We increased her Lantus and Novolin  N. -We reviewed together latest HbA1c from last month and this was only slightly higher than before, above target, at 8.5% -At this visit, sugars are much improved, actually the best I have seen for her, but unfortunately, this is due to price decreased appetite due to chemotherapy.  She initially had nausea but this is not treated.  However, her lack of appetite persisted. -We can continue the same doses of insulin, however, due to her decreased kidney function, we have to stop Metformin - I suggested to:  Patient Instructions  Please continue: - Lantus 28 units in am - Novolin R before a meal: 10-16 units before meals  Please stop: - Metformin  Please come back in 3-4 months with your sugar log  - advised to check sugars at different times of the day - 3x a day, rotating check times - advised for yearly eye exams >> she is not UTD - return to clinic in 3-4 months    2. HL -Reviewed latest lipid panel from 03/2019: LDL above goal, but improved, triglycerides also slightly high Lab Results  Component Value Date   CHOL 183 04/01/2019   HDL 55.70 04/01/2019   LDLCALC 93 04/01/2019   LDLDIRECT 140.0 09/12/2018   TRIG 171.0 (H) 04/01/2019   CHOLHDL 3 04/01/2019  -Continues lovastatin 40 without side effects  3. Obesity -Since our last in person visit, she gained approximately 9 pounds -She now has decreased appetite and lost few  pounds in the last month -Unfortunately, we have to stop Metformin, which she has an appetite suppressant effect, but I am hoping that we can restart this in the near future when her kidney function recovers   - time spent with the patient: 13 min, of which >50% was spent in obtaining information about her symptoms, reviewing her previous labs, evaluations, and treatments, counseling her about her condition (please see the discussed topics above), and developing a plan to further investigate and treat it; she had a number of questions which I  addressed.   Philemon Kingdom, MD PhD [place] [place] [facility]

## 2019-12-29 NOTE — Telephone Encounter (Signed)
Patient called in and wanted to make a Virtual visit with Dr. Yong Channel for Loss of Appetite. Patient said she doesn't have a appetite and wanted to see if she could be worked in this week for a virtual visit.

## 2019-12-29 NOTE — Patient Instructions (Addendum)
Please continue: - Lantus 28 units in am - Novolin R before a meal: 10-16 units before meals  Please stop: - Metformin  Please come back in 3-4 months with your sugar log

## 2019-12-30 ENCOUNTER — Telehealth: Payer: Self-pay | Admitting: *Deleted

## 2019-12-30 ENCOUNTER — Encounter (HOSPITAL_COMMUNITY): Payer: Medicare Other

## 2019-12-30 NOTE — Telephone Encounter (Signed)
Informed patient that PCP does not have any openings this week. Offered patient appts with other providers in our office to see her today. Patient declined and said she would just wait.

## 2019-12-30 NOTE — Telephone Encounter (Signed)
Called pt back & she was feeling much better.  She asked about something for appetite.  She is willing to r/s PET but states this is the 3rd time she has r/s.  Discussed eating small freq meals & maybe glucerna when possible.  Pt states Dr Letta Median handles her diabetes & will therefore send her message that was sent to her PCP.  Message routed to Feng/Pod RN

## 2019-12-30 NOTE — Telephone Encounter (Signed)
Please call her back and ask her to check BG daily. If she is agreeable, please r/s her PET scan.  Thanks, Regan Rakers

## 2019-12-30 NOTE — Telephone Encounter (Signed)
Pt called & left vm that she had a bad episode this am & son had to cancel her PET.  She reports that her BS dropped & she was shaking & disoriented & her head hurt so bad.  She took some tylenol.  Pt left wrong ph # & no DOB so had trouble finding her but returned call & asked if she had informed her PCP.  Her appetite has not been good & she is on several meds for diabetes which may need adjusting.  She states she called & was told no openings today & could come in next Thursday.  She has eaten some nabs & BS was 75 after eating.  Informed that this would be discussed with Cira Rue NP & she should call PCP back & let them know about her BS.  She probably needs meds to be adjusted.  She would like for [name] to notify Dr Yong Channel.

## 2019-12-31 ENCOUNTER — Telehealth: Payer: Self-pay

## 2019-12-31 NOTE — Telephone Encounter (Signed)
Forms for PAP renewal filled out, signed by Dr. Cruzita Lederer and faxed to [facility] with confirmation.

## 2020-01-02 ENCOUNTER — Inpatient Hospital Stay: Payer: Medicare Other

## 2020-01-02 ENCOUNTER — Inpatient Hospital Stay: Payer: Medicare Other | Admitting: Hematology

## 2020-01-06 ENCOUNTER — Telehealth: Payer: Self-pay

## 2020-01-06 NOTE — Telephone Encounter (Signed)
2 boxes of Lantus arrived for patient

## 2020-01-06 NOTE — Telephone Encounter (Signed)
Wendy [name] called back.  I reviewed with her she only has to be npo for 4 hours prior to her scan.  I told her to eat breakfast by 0900 that morning.  I asked that she check her BS before she eats breakfast.  Per Dr. Burr Medico if her BS is less than 150 she is to hold her short acting insulin.   I asked that she check her BS before she leaves for the PET scan if it is low she is to call the PET techs for further instructions. I gave her the number for the PET techs [phone].  She verbalized understanding.

## 2020-01-06 NOTE — Telephone Encounter (Signed)
I called Wendy [name] to review Pet scan instructions given to me by Pet scan techs to help Wendy Delaluz avoid blood sugar issues.  As I began to tell her the phone went dead.  I called her back and left a message with brief instructions and provided understanding if she did not want to have the PET scan done.

## 2020-01-07 ENCOUNTER — Other Ambulatory Visit (HOSPITAL_COMMUNITY): Payer: Medicare Other

## 2020-01-07 ENCOUNTER — Encounter (HOSPITAL_COMMUNITY)
Admission: RE | Admit: 2020-01-07 | Discharge: 2020-01-07 | Disposition: A | Discharge status: Home / Self Care | Payer: Medicare Other | Source: Ambulatory Visit | Attending: Hematology | Admitting: Hematology

## 2020-01-07 ENCOUNTER — Other Ambulatory Visit: Payer: Self-pay

## 2020-01-07 DIAGNOSIS — J90 Pleural effusion, not elsewhere classified: Secondary | ICD-10-CM | POA: Diagnosis not present

## 2020-01-07 DIAGNOSIS — C50412 Malignant neoplasm of upper-outer quadrant of left female breast: Secondary | ICD-10-CM | POA: Diagnosis not present

## 2020-01-07 DIAGNOSIS — Z170 Estrogen receptor positive status [ER+]: Secondary | ICD-10-CM | POA: Insufficient documentation

## 2020-01-07 DIAGNOSIS — C787 Secondary malignant neoplasm of liver and intrahepatic bile duct: Secondary | ICD-10-CM | POA: Diagnosis not present

## 2020-01-07 DIAGNOSIS — C7951 Secondary malignant neoplasm of bone: Secondary | ICD-10-CM | POA: Diagnosis not present

## 2020-01-07 DIAGNOSIS — C50919 Malignant neoplasm of unspecified site of unspecified female breast: Secondary | ICD-10-CM | POA: Diagnosis not present

## 2020-01-07 DIAGNOSIS — R591 Generalized enlarged lymph nodes: Secondary | ICD-10-CM | POA: Insufficient documentation

## 2020-01-07 DIAGNOSIS — Z79899 Other long term (current) drug therapy: Secondary | ICD-10-CM | POA: Insufficient documentation

## 2020-01-07 LAB — GLUCOSE, CAPILLARY: Glucose-Capillary: 180 mg/dL — ABNORMAL HIGH (ref 70–99)

## 2020-01-07 MED ORDER — FLUDEOXYGLUCOSE F - 18 (FDG) INJECTION: 9.9000 | Freq: Once | INTRAVENOUS | Status: DC | PRN

## 2020-01-07 NOTE — Progress Notes (Signed)
[place]   [phone] [phone]   Clinic Follow up Note   Patient Care Team: Marin Olp, MD as PCP - General (Family Medicine) Philemon Kingdom, MD as Consulting Physician (Internal Medicine) Jalene Mullet, MD as Consulting Physician (Ophthalmology) Erroll Luna, MD as Consulting Physician (General Surgery) Kyung Rudd, MD as Consulting Physician (Radiation Oncology) [name], MD as Consulting Physician (Hematology) Gardenia Phlegm, NP as Nurse Practitioner (Hematology and Oncology)   Date of Service:  01/08/2020   CHIEF COMPLAINT: Follow Up for Invasive Ductal Carcinoma of the left breast  SUMMARY OF ONCOLOGIC HISTORY: Oncology History Overview Note  Cancer Staging Malignant neoplasm of upper-outer quadrant of left breast in female, estrogen receptor positive ([place]) Staging form: Breast, AJCC 8th Edition - Clinical: Stage IIB (cT2, cN0, cM0, G3, ER: Positive, PR: Negative, HER2: Negative) - Signed by [name], MD on 11/23/2016 - Pathologic stage from 12/28/2016: Stage IIB (pT2, pN1a(sn), cM0, G3, ER: Positive, PR: Negative, HER2: Negative) - Signed by [name], MD on 01/02/2017     Malignant neoplasm of upper-outer quadrant of left breast in female, estrogen receptor positive ([place])  11/01/2016 Mammogram   Diagnostic mammogram and ultrasound of left breast and axilla showed a 3.1 x 2.1 x 1.4 cm (3.3 x 2.0 x 2.7 cm by ultrasound) mass in the upper outer quadrant of the anterior third of the left breast, associated with pleomorphic calcification. There is a 8 mm (1.8cm by [name]) prominent lymph node in the left axilla.   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   11/07/2016 Receptors her2   ER 80% positive, PR negative, HER-2 negative, Ki-67 90%   11/20/2016 Initial Diagnosis   Malignant neoplasm of upper-outer quadrant of  left breast in female, estrogen receptor positive ([place])   12/07/2016 Surgery   Left lumpectomy and left axillary sentinel lymph node sampling by Dr. Brantley Stage   12/07/2016 Pathology Results   pT2, pN1 Left Lumpectomy: Grade 3 IDC measuring 3.4 cm, carcinoma broadly present at the superior margin. Grade 3 DCIS. 1 out of 2 left axillary SLN positive for metastatic carcinoma   12/07/2016 Miscellaneous   Mammaprint showed high risk disease, basal type    12/28/2016 Surgery   Re-excision of the previously positive superior margin was negative for malignant cells.    01/04/2017 Surgery   Port inserted   01/18/2017 - 03/22/2017 Chemotherapy   Adjuvant Docetaxel 75 mg/m and Cytoxan 600 mg/m, every 21 days, for total of 4 cycles, with Neulasta on day 2.    04/23/2017 - 05/18/2017 Radiation Therapy   Radiation treatment dates:   04/23/17 - 05/18/17 Administered by Dr. Lisbeth Renshaw  Site/dose:    Left breast/ 42.5 Gy in 17 Fx Boost / 7.5 Gy in 3 Fx   06/12/2017 - 08/14/2019 Anti-estrogen oral therapy   Adjuvant letrozole 1 mg daily, plan for 5-7 years. D/c on 08/14/19 due to local recurrence of left axilla.     07/20/2017 Mammogram   IMPRESSION: No mammographic evidence of malignancy in either breast. 3.8 cm left breast postsurgical loculated seroma.   09/12/2017 Survivorship     09/19/2018 Imaging   Baseline DEXA 09/19/18 ASSESSMENT: The BMD measured at Femur Neck Left is 0.888 g/cm2 with a T-score of -1.1. This patient is considered osteopenic according to [facility]) criteria.   The scan quality is good. L-4 was excluded due to degenerative changes.  Site Region Measured Date Measured Age YA BMD Significant CHANGE T-score AP Spine  L1-L3      09/19/2018    [age]         -0.7    1.084 g/cm2   DualFemur Neck Left  09/19/2018    [age]         -1.1    0.888 g/cm2   DualFemur Total Mean 09/19/2018    77.2         -0.5    0.946 g/cm2 ASSESSMENT: The probability of a major  osteoporotic fracture is 4.5 % within the next ten years.   The probability of hip fracture is 0.8  % within the next 10 years.   06/25/2019 Mammogram   Diagnostic mammogram 06/25/19  IMPRESSION: 1.  Two morphologically abnormal lymph nodes in the left axilla with a lymph node in the lower left axilla measuring 2.4 x 2 x 2.6 cm.   2. Stable lumpectomy changes left breast with no findings of malignancy in either breast.   07/01/2019 Pathology Results   Diagnosis Lymph node, needle/core biopsy, left axilla - POORLY DIFFERENTIATED CARCINOMA CONSISTENT WITH BREAST PRIMARY. - NO LYMPHOID NODAL TISSUE IDENTIFIED. - SEE MICROSCOPIC DESCRIPTION. Microscopic Comment The carcinoma is consistent with grade III.  PROGNOSTIC INDICATORS Results: IMMUNOHISTOCHEMICAL AND MORPHOMETRIC ANALYSIS PERFORMED MANUALLY The tumor cells are NEGATIVE for Her2 (1+). Estrogen Receptor: 80%, POSITIVE, WEAK STAINING INTENSITY Progesterone Receptor: 0%, NEGATIVE Proliferation Marker Ki67: 80%   07/18/2019 Breast MRI   Breast MRI 07/18/19  IMPRESSION: 1. Two morphologically abnormal level 1 left axillary lymph nodes correlating with biopsy-proven malignancy. 2. No MRI evidence of malignancy in either breast. 3. Postsurgical changes of the left breast consistent with prior lumpectomy.   07/25/2019 PET scan   PET 07/25/19  IMPRESSION: 1. Hypermetabolic LEFT axillary mass consistent with metastatic breast cancer. 2. High LEFT axillary/posterior triangle (level 5) lymph node consistent with local metastasis. 3. No central mediastinal nodal metastasis. No suspicious pulmonary nodules. 4. No distant soft tissue metastasis or skeletal metastasis. 5. Calcified fibroid uterus   08/05/2019 Surgery   LEFT AXILLARY LYMPH NODE DISSECTION by Dr. Brantley Stage 08/05/19    08/05/2019 Pathology Results   FINAL MICROSCOPIC DIAGNOSIS: 08/05/19    A. LYMPH NODES, LEFT AXILLARY, DISSECTION:  - Metastatic carcinoma.  - See  comment.    08/2019 -  Anti-estrogen oral therapy   Exemestane '25mg'$  starting 08/2019. 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 No definitive pulmonary embolism. 2. Multiple new small bilateral pulmonary nodules consistent with metastatic disease. 3. Soft tissue density in the left axilla at the site of node dissection, probably representing scarring but I cannot exclude tumor recurrence. 4. New partially healed fracture of the posterior aspect of the right eighth rib.   12/04/2019 -  Chemotherapy   Verzenio '150mg'$  BID starting 12/04/19. Stopped a week before the start of chemo in 01/2020   01/07/2020 PET scan   IMPRESSION: 1. Marked worsening of disease with soft tissue recurrence in the left axilla, new and enlarging lymph nodes in the supraclavicular/high axillary region, new mediastinal adenopathy and new pulmonary pleural and parenchymal disease. 2. Metastatic disease to the right hepatic lobe. 3. Signs of bony metastasis. 4. New pleural effusions presumably malignant and associated with pleural nodularity.   01/20/2020 -  Chemotherapy   The patient had PACLitaxel-protein bound (ABRAXANE) chemo infusion 150 mg, 80 mg/m2 = 150 mg, Intravenous,  Once, 0 of 4 cycles  for chemotherapy treatment.    01/21/2020 - 01/21/2020 Chemotherapy   The patient had PACLitaxel (TAXOL) 156 mg in sodium chloride 0.9 % 250 mL chemo infusion (</= 58m/m2), 80 mg/m2, Intravenous,  Once, 0 of 4 cycles  for chemotherapy treatment.       CURRENT THERAPY:  Exemestane 256mstarting 08/2019 and Verzenio 15065mID starting 12/04/19. Plan to hold for chemo. PENDING weekly Abraxane 3 weeks on/1 week off  INTERVAL HISTORY:  Wendy [name] here for a follow up to discuss PET. She presents to the clinic  with her son. She notes she feels tired. She notes she is able to take care of herself with ADLs. She notes she cannot walk too long in a store and can get SOB quickly. She denies pain in her chest. She notes stomach bloating with occasional catch, sharp pain in her b/l abdomen. She notes occasional mild diarrhea or constipation. She notes she takes Metamucil and prune juice. She notes her appetite is not good. She notes with antiemetics her nausea stopped but her appetite decreased. She notes she feels full often. She still has Glucerna. She is open to appetite stimulant. She notes only having 1 finger and 1 foot with mild neuropathy. Her son notes is available to help take care of her. He [place]  REVIEW OF SYSTEMS:   Constitutional: Denies fevers, chills or abnormal weight loss (+) Lower appetite  Eyes: Denies blurriness of vision Ears, nose, mouth, throat, and face: Denies mucositis or sore throat Respiratory: Denies cough, dyspnea or wheezes Cardiovascular: Denies palpitation, chest discomfort or lower extremity swelling Gastrointestinal:  (+) nausea (+) early satiety (+) Abdominal bloating (+) b/l abdominal pain intermittently (+) Occasional diarrhea/constipation Skin: Denies abnormal skin rashes Lymphatics: Denies new lymphadenopathy or easy bruising Neurological: (+) 1 right fingers and foot with mild neuropathy  Behavioral/Psych: Mood is stable, no new changes  All other systems were reviewed with the patient and are negative.  MEDICAL HISTORY:  Past Medical History:  Diagnosis Date  . Abnormal breast finding 2018   per pt/ having  a lot drainage from left breast nipple  . Anemia   . Anxiety   . [facility] [place]   left/  . CKD (chronic kidney disease)   . Depression   . GERD (gastroesophageal reflux disease)    TUMS as needed  . HTN (hypertension)    states BP has been high recently; has been on med. x 20 yr.    . Hyperlipidemia   . Hyperplastic colon polyp   . HYPERTENSION, BENIGN SYSTEMIC 11/29/2006   Lisinopril hctz 10-12.5mg73mmlodipine 10mg68mreg 25mg 44m clonidine 0.3 mg BID.   . 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 Personal history of chemotherapy    2018/finished 6 weeks of chemo in Sep 2018  . Personal history of radiation therapy    2018 left breast/finished radiation in Sept 2018 per pt.    SURGICAL HISTORY: Past Surgical History:  Procedure Laterality Date  . AXILLARY LYMPH NODE DISSECTION Left 08/05/2019   Procedure: LEFT AXILLARY LYMPH NODE DISSECTION;  Surgeon: CornetErroll Luna Location: [facility] [place]: General;  Laterality: Left;  . BREAST LUMPECTOMY Left 05/05/2008  . BREAST LUMPECTOMY Left 12/07/2016   malignant  . BREAST  LUMPECTOMY WITH RADIOACTIVE SEED AND SENTINEL LYMPH NODE BIOPSY Left 12/07/2016   Procedure: LEFT BREAST LUMPECTOMY WITH RADIOACTIVE SEED AND SENTINEL LYMPH NODE BIOPSY;  Surgeon: Erroll Luna, MD;  Location: [facility];  Service: General;  Laterality: Left;  . IR FLUORO GUIDE PORT INSERTION RIGHT  01/04/2017  . IR US GUIDE VASC ACCESS RIGHT  01/04/2017  . PORT-A-CATH REMOVAL N/A 05/30/2017   Procedure: REMOVAL PORT-A-CATH;  Surgeon: Erroll Luna, MD;  Location[facility];  Service: General;  Laterality: N/A;  . RE-EXCISION OF BREAST LUMPECTOMY Left 12/28/2016   Procedure: RE-EXCISION OF BREAST LUMPECTOMY;  Surgeon: Erroll Luna, MD;  Location: [facility];  Service: General;  Laterality: Left;    I have reviewed the social history and family history with the patient and they are unchanged from previous note.  ALLERGIES:  has No Known Allergies.  MEDICATIONS:  Current Outpatient Medications  Medication Sig Dispense Refill  . abemaciclib (VERZENIO) 150 MG tablet Take 1 tablet (150 mg total) by mouth 2 (two) times daily. Swallow tablets whole. Do not chew, crush, or split tablets before swallowing. 60  tablet 1  . albuterol (VENTOLIN HFA) 108 (90 Base) MCG/ACT inhaler Inhale 2 puffs into the lungs every 6 (six) hours as needed for wheezing or shortness of breath. 18 g 2  . amLODipine (NORVASC) 10 MG tablet TAKE 1 TABLET BY MOUTH EVERY DAY 90 tablet 1  . aspirin 81 MG tablet Take 81 mg by mouth daily.      . B-D INS SYRINGE 0.5CC/31GX5/16 31G X 5/16" 0.5 ML MISC USE AS DIRECTED 4 TIMES A DAY 300 each 1  . busPIRone (BUSPAR) 10 MG tablet Take 1 tablet (10 mg total) by mouth 3 (three) times daily. 270 tablet 1  . carvedilol (COREG) 25 MG tablet Take 1 tablet (25 mg total) by mouth 2 (two) times daily with a meal. 180 tablet 3  . cloNIDine (CATAPRES) 0.3 MG tablet TAKE 1 TABLET (0.3 MG TOTAL) BY MOUTH 2 (TWO) TIMES DAILY. 180 tablet 1  . exemestane (AROMASIN) 25 MG tablet TAKE 1 TABLET BY MOUTH ONCE A DAY AFTER BREAKFAST 30 tablet 1  . fluticasone (FLONASE) 50 MCG/ACT nasal spray Place 2 sprays into both nostrils daily. 16 g 3  . fluticasone furoate-vilanterol (BREO ELLIPTA) 200-25 MCG/INH AEPB Inhale 1 puff into the lungs daily. (Patient not taking: Reported on 12/15/2019) 60 each 3  . fluticasone furoate-vilanterol (BREO ELLIPTA) 200-25 MCG/INH AEPB Inhale 1 puff into the lungs daily. (Patient not taking: Reported on 12/15/2019) 2 each 0  . HYDROcodone-acetaminophen (NORCO/VICODIN) 5-325 MG tablet Take 1 tablet by mouth every 8 (eight) hours as needed for moderate pain. 15 tablet 0  . Insulin Glargine (LANTUS SOLOSTAR) 100 UNIT/ML Solostar Pen Inject 28 units into the skin in am (Patient taking differently: 26 Units. Inject 28 units into the skin in am) 10 pen 3  . Insulin Pen Needle 31G X 5 MM MISC Use pen needles for insulin injection daily 100 each 3  . insulin regular (NOVOLIN R) 100 units/mL injection Inject 0.14-0.18 mLs (14-18 Units total) into the skin 3 (three) times daily before meals. Relion. Please provide insulin syringes needed (Patient taking differently: Inject 10-16 Units into the skin  3 (three) times daily before meals. Relion. Please provide insulin syringes needed) 20 mL 11  . Insulin Syringe-Needle U-100 (B-D INS SYRINGE 0.5CC/30GX1/2") 30G X 1/2" 0.5 ML MISC USE AS DIRECTED 4 TIMES A DAY 300 each 3  . lidocaine-prilocaine (EMLA)  cream Apply to affected area once 30 g 3  . lisinopril-hydrochlorothiazide (ZESTORETIC) 20-12.5 MG tablet Take 1 tablet by mouth daily. 90 tablet 3  . lovastatin (MEVACOR) 40 MG tablet TAKE 1 TABLET BY MOUTH EVERYDAY AT BEDTIME 90 tablet 3  . metFORMIN (GLUCOPHAGE) 500 MG tablet TAKE 2 TABLETS BY MOUTH DAILY WITH SUPPER. (Patient taking differently: 500 mg 2 (two) times daily with a meal. ) 180 tablet 2  . mirtazapine (REMERON) 7.5 MG tablet Take 1 tablet (7.5 mg total) by mouth at bedtime. 30 tablet 0  . ondansetron (ZOFRAN) 8 MG tablet Take 1 tablet (8 mg total) by mouth 2 (two) times daily as needed (Nausea or vomiting). 30 tablet 1  . OVER THE COUNTER MEDICATION Take 1 tablet by mouth daily as needed. Stool softner for constipation, also drinks warm prune juice prn    . polyethylene glycol powder (GLYCOLAX/MIRALAX) 17 GM/SCOOP powder Take 17 g by mouth daily. 1 g 0  . prochlorperazine (COMPAZINE) 10 MG tablet Take 1 tablet (10 mg total) by mouth every 6 (six) hours as needed for nausea or vomiting. 30 tablet 2  . spironolactone (ALDACTONE) 25 MG tablet Take 0.5 tablets (12.5 mg total) by mouth daily. 15 tablet 3  . traMADol (ULTRAM) 50 MG tablet TAKE 1 TABLET BY MOUTH EVERY 6 HOURS AS NEEDED FOR MODERATE/SEVERE PAIN (NECK AND SHOULDER PAIN). 20 tablet 0   No current facility-administered medications for this visit.   Facility-Administered Medications Ordered in Other Visits  Medication Dose Route Frequency Provider Last Rate Last Admin  . fludeoxyglucose F - 18 (FDG) injection 9.9 millicurie  9.9 millicurie Intravenous Once PRN Lorriane Shire, MD        PHYSICAL EXAMINATION: ECOG PERFORMANCE STATUS: 2 - Symptomatic, <50% confined to bed  No  vitals taken today, Exam not performed today   LABORATORY DATA:  I have reviewed the data as listed CBC Latest Ref Rng & Units 12/11/2019 10/30/2019 10/07/2019  WBC 4.0 - 10.5 K/uL 5.4 8.3 7.9  Hemoglobin 12.0 - 15.0 g/dL 10.7(L) 11.8(L) 12.1  Hematocrit 36.0 - 46.0 % 31.8(L) 34.7(L) 36.8  Platelets 150 - 400 K/uL 277 333.0 318.0     CMP Latest Ref Rng & Units 12/11/2019 11/07/2019 10/07/2019  Glucose 70 - 99 mg/dL 143(H) 322(H) 206(H)  BUN 8 - 23 mg/dL 38(H) 31(H) 21  Creatinine 0.44 - 1.00 mg/dL 2.63(H) 1.66(H) 1.33(H)  Sodium 135 - 145 mmol/L 135 133(L) 141  Potassium 3.5 - 5.1 mmol/L 5.5(H) 4.4 4.0  Chloride 98 - 111 mmol/L 102 98 102  CO2 22 - 32 mmol/L _0 Calcium 8.9 - 10.3 mg/dL 9.2 8.9 9.9  Total Protein 6.5 - 8.1 g/dL 7.2 - 7.2  Total Bilirubin 0.3 - 1.2 mg/dL 0.4 - 0.3  Alkaline Phos 38 - 126 U/L 109 - 109  AST 15 - 41 U/L 9(L) - 10  ALT 0 - 44 U/L <6 - 5      RADIOGRAPHIC STUDIES: I have personally reviewed the radiological images as listed and agreed with the findings in the report. NM PET Image Restag (PS) Skull Base To Thigh  Result Date: 01/07/2020 CLINICAL DATA:  Subsequent treatment strategy for breast cancer with lumpectomy and left axillary dissection in the past. Patient was found to have disease on PET exam from 07/25/2019. EXAM: NUCLEAR MEDICINE PET SKULL BASE TO THIGH TECHNIQUE: 9.9 mCi F-18 FDG was injected intravenously. Full-ring PET imaging was performed from the skull base to thigh after the radiotracer.  CT data was obtained and used for attenuation correction and anatomic localization. Fasting blood glucose: 180 mg/dl COMPARISON:  10/31/2019 and 07/25/2019 FINDINGS: Mediastinal blood pool activity: SUV max 3.44 Liver activity: SUV max NA NECK: No hypermetabolic lymph nodes in the neck. Incidental CT findings: none CHEST: Left supraclavicular lymph nodes (image 41, series 4) (image 40, series 4) each measuring approximately 17-18 mm short axis largest in  this location previously 14 mm (SUVmax = 9.7 previously 5.7 registration changed based on arm position on today's study.) The more medial of these nodes is new though more lateral was present previously. Left axillary mass (image 67, series 4) 4.8 x 2.5 cm, this is below axillary surgical clips presumably following resection of previous nodal mass in this location (SUVmax = 12.4 other small areas of nodularity with slightly less FDG uptake seen posterior to the dominant lesion in the left axillary region. For example on image 72 of series 4 there is a 7 mm soft tissue nodule. Intense FDG uptake along the superior margin of postoperative changes in the left axilla (image 56, series 4) 7 mm greatest thickness (SUVmax = 8.1 other small lymph nodes with slightly less FDG uptake tracking along subpectoral chain on the left. New mediastinal adenopathy (image 59, series 4) right paratracheal lymph node measuring 14 mm (SUVmax = 10.1) Subcarinal lymph nodes previously showing mild FDG uptake now with marked increased FDG uptake (image 64, series 4) short axis dimension 13 mm for the largest (SUVmax = 9.1) previously 3.7 New pleural disease in the posterior right costodiaphragmatic sulcus just above the right hemi liver (image 83, series 4) 13 x 21 mm (SUVmax = 5.3.) Numerous new pulmonary nodules and pleural nodularity bilaterally pleural nodularity also present in the left lung base as well on image 46 of series 8 also showing increased metabolic activity. Example of pulmonary nodules with several new nodule seen in the right upper lobe (image 12, series 8) largest approximately 10 mm. Left upper lobe nodule (image 16, series 8) 8 mm less avid nodal disease and bone disease, see below. Incidental CT findings: New pleural effusions. Small pericardial effusion. Mild cardiac enlargement. Scattered atherosclerotic changes of the thoracic aorta. ABDOMEN/PELVIS: Hepatic lesion (image 87, series 4) measuring approximately 2.5 cm  (SUVmax = 7.4 in the posterior right hepatic lobe, hepatic subsegment VII) Left retroperitoneal lymph node unchanged and without increased FDG uptake (image 127, series 4) 11 mm Diffuse uptake in the left psoas muscle, asymmetric but without changes in the muscle that would suggest disease in terms of its CT appearance Incidental CT findings: Bilateral renal cysts. Atherosclerotic changes in the abdominal aorta. Fibroid uterus with calcifications. Diverticulosis. SKELETON: Right posterior eighth rib with intense metabolic activity, expansile changes and subacute or chronic appearing pathologic fracture (image 72, series 4) (SUVmax = 7.8) Left scapular activity without definable lesion (SUVmax = 7.6 involving the superior left scapula. Is Other areas of rib activity scattered about that are suspicious for early rib involvement. Muscular involvement and some brown fat activity confound assessment. Left iliac metastasis (image 140, series 4): (SUVmax = 4.7 no discernible lesion in this location on CT image.) Incidental CT findings: none IMPRESSION: 1. Marked worsening of disease with soft tissue recurrence in the left axilla, new and enlarging lymph nodes in the supraclavicular/high axillary region, new mediastinal adenopathy and new pulmonary pleural and parenchymal disease. 2. Metastatic disease to the right hepatic lobe. 3. Signs of bony metastasis. 4. New pleural effusions presumably malignant and associated with pleural nodularity.  Electronically Signed   By: Zetta Bills M.D.   On: 01/07/2020 18:19     ASSESSMENT & PLAN:  ALEIGHYA MCANELLY is a 79 y.o. female with    1. Malignant neoplasm of upper-outer quadrant of left breast, Invasive Ductal Carcinoma, pT2pN1M0, stage IIB, ER+/PR-/HER2-, G3, mammaprint high risk, axillarynodesrecurrence in 06/2019, lung nodules in 10/2019, metastatic to lung, bone, LNs, liver in 01/2020 -She was initiallydiagnosed in 11/2016. She was treated with left lumpectomy and SLN  sampling,re-excision surgery, Adjuvant TC for 4 cycles andadjuvantradiation.  -She was on adjuvant Letrozole from 06/2017-08/14/19 -Unfortunately she had local recurrence in left axilla and underwent Left axillary lymph node dissection by Dr. Brantley Stage on 08/05/19.She had complete resection. -I recommended she continue Exemestane and added oral biological agent Verzenio 148m BID starting 12/04/19.  -I personally reviewed and discussed her PET images from 01/07/20 with pt and her son together  which shows wide spread disease with soft tissue recurrence in the left axilla, enlarging LNs in chest, new lung pleural and parenchymal disease, metastatic disease of the right liver lobe and signs of bone metastasis. There is also new pleural effusions presumably malignant and associated with pleural nodularity.  -she has developed fatigue, dyspnea, and anorexia lately, likely from her metastatic disease. -Based on the scan findings, I do not think biopsy is necessary. At this stage her cancer is no longer curable but still treatable.  -Due to the rapid disease progression through AI and CDK4/6 inhibitor, and multiple visceral organ involvement, I recommend change treatment to chemotherapy for better rapid disease control. -I recommend single agent Abraxane 871mm2 weekly, 3 weeks on/1 week off for 3-6 months. If she has excellent disease control, will switch her back to endocrine therapy with Fulvestrant and Verzenio. Will monitor with PET scan in future. Goal of treatment is palliative to control her disease and prolong her life.  --Chemotherapy consent: Side effects including but does not not limited to, fatigue, nausea, vomiting, diarrhea, hair loss, neuropathy, fluid retention, renal and kidney dysfunction, neutropenic fever, needed for blood transfusion, bleeding, were discussed with patient in great detail. She agrees to proceed. -The goal of therapy is palliative, for disease control and prolong her life -Due  to her poorly controlled diabetes, I recommend Abraxane, instead of Taxol, to reduce her needs of steroids as premedication -She can stop Verzenio 7 days before start of chemo and stop Exemestane the day before chemo.  -I reviewed her FO results which showed RET, CDK8, NOTCH1, TP53 mutations.No targetable mutation for now.  -f/u in 2 weeks with start of treatment.  -she agrees with port placement   2. Low appetite, nausea, B/l abdominal pain, Diarrhea/constipation -She notes she has nausea which is controlled with antiemetics -She has lowered appetite from her bloating, early satiety, along with occasional b/l abdominal pain and occasional diarrhea/constipation.  -She has metamucil and prune juice as needed which helps her bowels.  -I encouraged her to use her Glucerna to help her maintain weight. To help her appetite I called in Mirtazapine 7.60m49mor her to take nightly (01/08/20). If not enough we can titrate up or I can call in Marinol. I recommend she hold Trazodone for now given Mirtazapine can cause drowsiness.    3. Dry cough and SOB, lung metastasis -She has been having Dry Cough and SOB for the past few months and has progressed recently.  -Her PCP referred her to pulmonologist Dr. ManVaughan Browneratient denies smoking before but notes she has h/o of Tobacco snuff.   -  Her breathing approved on Breo given by her PCP. I encouraged her to ask for financial assistance given high copay.  -She has received both her COVID19 vaccinations.  -Her PET from 01/07/20 showed new pulmonary pleural and parenchymal disease and presumably malignant pleural effusions. Overall indications of her breast cancer metastasized to lung.    4. Left arm numbness/tinglingandLeft arm lymphedema, secondary to surgery. -secondary to malignant left axillary adenopathy, s/p ALND in 08/2019 -She still has left armand handlymphedema. I discussed her edema can improve but may have residual edema permanently. -She is  currently doing PT 3 times a week, continue.   5. HTN, DM, CKD III -F/u with PCPand endocrinologist.  -DM is not well controlled.  I encouraged her to better control her DM as this can effects her kidney function.   6. Mild anemia -Iron and TIBC, B12, MMA normal -mildand stable.  7. Osteopenia  -09/2018 DEXA shows osteopeniawith lowest T-score -1.1 -Will monitor onAI. Next DEXA in 09/2020  8. Financial and Social Support  -She paid $165 out of pocket for her exemestane.  -I encouraged her to contact financial advocate Shauna about applying for grant to cover or help cost of medication. She is interested. -She gets help with her son but he is looking to return to working. I discussed there is transportation help from [facility] if needed. She is open.  -Per request, I will see if she is eligible for home aid.   9. Goal of care discussion  -We again discussed the incurable nature of her cancer, and the overall poor prognosis, especially if she does not have good response to chemotherapy or progress on chemo -The patient understands the goal of care is palliative. -I recommend DNR/DNI, she will think about it    Plan: -I called in Mirtazapine today for her anorexia  -PET scan reviewed, shows widespread metastasis.  -PAC placement in 1-2 weeks by Dr. Alena Bills or IR  -Lab, flush, f/u chemo Abraxane in 2 weeks  -Stop Verzenio 7 days before start of chemo and stop Exemestane the day before chemo.    No problem-specific Assessment & Plan notes found for this encounter.   No orders of the defined types were placed in this encounter.  I discussed the assessment and treatment plan with the patient. The patient was provided an opportunity to ask questions and all were answered. The patient agreed with the plan and demonstrated an understanding of the instructions.  The patient was advised to call back or seek an in-person evaluation if the symptoms worsen or if the condition  fails to improve as anticipated.  The total time spent in the appointment was 45 minutes.    [name], MD 01/08/2020   I, Joslyn Devon, am acting as scribe for [name], MD.   I have reviewed the above documentation for accuracy and completeness, and I agree with the above.

## 2020-01-08 ENCOUNTER — Inpatient Hospital Stay: Payer: Medicare Other | Attending: Hematology | Admitting: Hematology

## 2020-01-08 ENCOUNTER — Other Ambulatory Visit: Payer: Self-pay

## 2020-01-08 ENCOUNTER — Encounter: Payer: Self-pay | Admitting: Hematology

## 2020-01-08 VITALS — BP 149/74 | HR 92 | Temp 97.8°F | Resp 18 | Ht 62.0 in | Wt 192.7 lb

## 2020-01-08 DIAGNOSIS — E1122 Type 2 diabetes mellitus with diabetic chronic kidney disease: Secondary | ICD-10-CM | POA: Insufficient documentation

## 2020-01-08 DIAGNOSIS — I1310 Hypertensive heart and chronic kidney disease without heart failure, with stage 1 through stage 4 chronic kidney disease, or unspecified chronic kidney disease: Secondary | ICD-10-CM | POA: Insufficient documentation

## 2020-01-08 DIAGNOSIS — M8580 Other specified disorders of bone density and structure, unspecified site: Secondary | ICD-10-CM | POA: Diagnosis not present

## 2020-01-08 DIAGNOSIS — N183 Chronic kidney disease, stage 3 unspecified: Secondary | ICD-10-CM | POA: Diagnosis not present

## 2020-01-08 DIAGNOSIS — C7800 Secondary malignant neoplasm of unspecified lung: Secondary | ICD-10-CM | POA: Diagnosis not present

## 2020-01-08 DIAGNOSIS — E113419 Type 2 diabetes mellitus with severe nonproliferative diabetic retinopathy with macular edema, unspecified eye: Secondary | ICD-10-CM | POA: Diagnosis not present

## 2020-01-08 DIAGNOSIS — E1165 Type 2 diabetes mellitus with hyperglycemia: Secondary | ICD-10-CM | POA: Diagnosis not present

## 2020-01-08 DIAGNOSIS — Z794 Long term (current) use of insulin: Secondary | ICD-10-CM | POA: Diagnosis not present

## 2020-01-08 DIAGNOSIS — I700 Atherosclerosis of aorta: Secondary | ICD-10-CM | POA: Diagnosis not present

## 2020-01-08 DIAGNOSIS — C779 Secondary and unspecified malignant neoplasm of lymph node, unspecified: Secondary | ICD-10-CM | POA: Diagnosis not present

## 2020-01-08 DIAGNOSIS — C7951 Secondary malignant neoplasm of bone: Secondary | ICD-10-CM | POA: Diagnosis not present

## 2020-01-08 DIAGNOSIS — C787 Secondary malignant neoplasm of liver and intrahepatic bile duct: Secondary | ICD-10-CM | POA: Insufficient documentation

## 2020-01-08 DIAGNOSIS — C50412 Malignant neoplasm of upper-outer quadrant of left female breast: Secondary | ICD-10-CM | POA: Diagnosis not present

## 2020-01-08 DIAGNOSIS — Z7189 Other specified counseling: Secondary | ICD-10-CM | POA: Diagnosis not present

## 2020-01-08 DIAGNOSIS — IMO0002 Reserved for concepts with insufficient information to code with codable children: Secondary | ICD-10-CM

## 2020-01-08 DIAGNOSIS — D649 Anemia, unspecified: Secondary | ICD-10-CM | POA: Insufficient documentation

## 2020-01-08 DIAGNOSIS — Z5111 Encounter for antineoplastic chemotherapy: Secondary | ICD-10-CM | POA: Diagnosis not present

## 2020-01-08 DIAGNOSIS — Z170 Estrogen receptor positive status [ER+]: Secondary | ICD-10-CM | POA: Diagnosis not present

## 2020-01-08 MED ORDER — ONDANSETRON HCL 8 MG PO TABS: 8.0000 mg | ORAL_TABLET | Freq: Two times a day (BID) | ORAL | 1 refills | Status: AC | PRN

## 2020-01-08 MED ORDER — MIRTAZAPINE 7.5 MG PO TABS: 7.5000 mg | ORAL_TABLET | Freq: Every day | ORAL | 0 refills | Status: DC

## 2020-01-08 MED ORDER — LIDOCAINE-PRILOCAINE 2.5-2.5 % EX CREA: TOPICAL_CREAM | CUTANEOUS | 3 refills | Status: DC

## 2020-01-08 NOTE — Progress Notes (Signed)
DISCONTINUE ON PATHWAY REGIMEN - Breast     A cycle is every 21 days:     Docetaxel      Cyclophosphamide   **Always confirm dose/schedule in your pharmacy ordering system**  REASON: Disease Progression PRIOR TREATMENT: [id number]: TC - Docetaxel, Cyclophosphamide q21 Days x 4 Cycles TREATMENT RESPONSE: Unable to Evaluate  START ON PATHWAY REGIMEN - Breast     A cycle is every 28 days (3 weeks on and 1 week off):     Paclitaxel   **Always confirm dose/schedule in your pharmacy ordering system**  Patient Characteristics: Distant Metastases or Locoregional Recurrent Disease - Unresected or Locally Advanced Unresectable Disease Progressing after Neoadjuvant and Local Therapies, HER2 Negative/Unknown/Equivocal, ER Positive, Chemotherapy, First Line Therapeutic Status: Distant Metastases BRCA Mutation Status: Awaiting Test Results ER Status: Positive (+) HER2 Status: Negative (-) PR Status: Negative (-) Line of Therapy: First Line Intent of Therapy: Non-Curative / Palliative Intent, Discussed with Patient

## 2020-01-08 NOTE — Telephone Encounter (Signed)
Spoke to pt told her 2 boxes of Lantus have arrived for her here at the office to pickup. Pt verbalized understanding and said she will come by later today.

## 2020-01-09 ENCOUNTER — Telehealth: Payer: Self-pay | Admitting: Pharmacist

## 2020-01-09 ENCOUNTER — Telehealth: Payer: Self-pay | Admitting: Hematology

## 2020-01-09 NOTE — Telephone Encounter (Signed)
Called patient on 01/09/2020 at 11:14 AM   Patient has not applied to Medicaid yet as instructed at prior appt (12/15/19). She states she received information yesterday regarding he cancer has came back. Expressed empathy to patient about cancer diagnosis.   Advised patient to follow up with Medicaid as soon as possible. She states she will contact them today. Will provide patient additional samples when I am back at office (Monday 01/12/20) to bridge her in the meantime. Will contact patient on Monday to follow up about Medicaid application  Thank you for involving pharmacy to assist in providing this patient's care.   Drexel Iha, PharmD PGY2 Ambulatory Care Pharmacy Resident

## 2020-01-09 NOTE — Telephone Encounter (Signed)
Scheduled appt per 4/8 los.  Spoke with pt and she is aware of the appt date and time.

## 2020-01-12 ENCOUNTER — Telehealth: Payer: Self-pay | Admitting: Pharmacist

## 2020-01-12 ENCOUNTER — Telehealth: Payer: Self-pay

## 2020-01-12 NOTE — Telephone Encounter (Signed)
Oral Oncology Patient Advocate Encounter   Was successful in securing patient a $4000 grant from [employer] to provide copayment coverage for Verzenio.  This will keep the out of pocket expense at $0.        The billing information is as follows and has been shared with [place].   Member ID: [id number] Group ID: [id number] RxBin: [id number] PCN: [id number] Dates of Eligibility: 01/12/20 through 01/11/21  Fund name:  Breast.   [place] [facility] [place] Phone [phone] Fax [phone] 01/12/2020 3:07 PM

## 2020-01-12 NOTE — Telephone Encounter (Signed)
Called patient on 01/12/2020 at 4:39 PM   Patient's son came by today to obtain Breo Ellipta samples. Unfortunately office is out of Breo Ellipta 200 mcg. Provided patient with 1 sample inhaler of Breo Ellipta 100 mcg. Also, provided son with [place] paperwork to switch patient from Adair Patter to Symbicort in the interim time frame until she applies to Medicaid.  Called patient later that day to explain interaction with son and advised her to bring completed Symbicort patient assistance application and financial documentation to office. Also, advised patient to make appt with me to further counsel on Symbicort use. Explained it typically takes ~1 month to initiate patient on patient assistance program to obtain inhaler from drug manufacturer (Symbicort - [place] through [employer]) so the sooner she brings application/documentaiton the better. Patient verbalized understanding.   Will follow up in 2 weeks to ensure appt is made and appropriate documentation has been provided to office.  Thank you for involving pharmacy to assist in providing this patient's care.   Drexel Iha, PharmD PGY2 Ambulatory Care Pharmacy Resident

## 2020-01-13 ENCOUNTER — Other Ambulatory Visit: Payer: Self-pay | Admitting: Family Medicine

## 2020-01-13 ENCOUNTER — Ambulatory Visit: Payer: Medicare Other | Admitting: Sports Medicine

## 2020-01-14 ENCOUNTER — Telehealth: Payer: Self-pay | Admitting: Family Medicine

## 2020-01-14 NOTE — Telephone Encounter (Signed)
[place] Database Verified LR: 08-27-2019 Qty: 20 Last office visit: 11-07-2019 Upcoming appointment: 02-09-2020

## 2020-01-14 NOTE — Progress Notes (Signed)
Pharmacist Chemotherapy Monitoring - Initial Assessment    Anticipated start date: 01/20/20   Regimen:  . Are orders appropriate based on the patient's diagnosis, regimen, and cycle? Yes . Does the plan date match the patient's scheduled date? Yes . Is the sequencing of drugs appropriate? Yes . Are the premedications appropriate for the patient's regimen? Yes . Prior Authorization for treatment is: Approved o If applicable, is the correct biosimilar selected based on the patient's insurance? not applicable  Organ Function and Labs: Marland Kitchen Are dose adjustments needed based on the patient's renal function, hepatic function, or hematologic function? Yes . Are appropriate labs ordered prior to the start of patient's treatment? Yes . Other organ system assessment, if indicated: N/A . The following baseline labs, if indicated, have been ordered: N/A  Dose Assessment: . Are the drug doses appropriate? Yes . Are the following correct: o Drug concentrations Yes o IV fluid compatible with drug Yes o Administration routes Yes o Timing of therapy Yes . If applicable, does the patient have documented access for treatment and/or plans for port-a-cath placement? yes . If applicable, have lifetime cumulative doses been properly documented and assessed? not applicable Lifetime Dose Tracking  No doses have been documented on this patient for the following tracked chemicals: Doxorubicin, Epirubicin, Idarubicin, Daunorubicin, Mitoxantrone, Bleomycin, Oxaliplatin, Carboplatin, Liposomal Doxorubicin  o   Toxicity Monitoring/Prevention: . The patient has the following take home antiemetics prescribed: Ondansetron and Prochlorperazine . The patient has the following take home medications prescribed: N/A . Medication allergies and previous infusion related reactions, if applicable, have been reviewed and addressed. Yes  . The patient's current medication list has been assessed for drug-drug interactions with  their chemotherapy regimen. no significant drug-drug interactions were identified on review.  Order Review: . Are the treatment plan orders signed? Yes . Is the patient scheduled to see a provider prior to their treatment? Yes  I verify that I have reviewed each item in the above checklist and answered each question accordingly.  Xiomar Crompton D 01/14/2020 3:33 PM

## 2020-01-14 NOTE — Telephone Encounter (Signed)
MEDICATION: traMADol (ULTRAM) 50 MG tablet  PHARMACY: [id number] [place], [place] - [address] Phone:  [phone]  Fax:  [phone]      Comments:   **Let patient know to contact pharmacy at the end of the day to make sure medication is ready. **  ** Please notify patient to allow 48-72 hours to process**  **Encourage patient to contact the pharmacy for refills or they can request refills through [facility]**

## 2020-01-15 NOTE — Telephone Encounter (Signed)
Medication has been refilled.

## 2020-01-20 ENCOUNTER — Inpatient Hospital Stay: Payer: Medicare Other

## 2020-01-20 ENCOUNTER — Inpatient Hospital Stay (HOSPITAL_BASED_OUTPATIENT_CLINIC_OR_DEPARTMENT_OTHER): Payer: Medicare Other | Admitting: Nurse Practitioner

## 2020-01-20 ENCOUNTER — Other Ambulatory Visit: Payer: Self-pay

## 2020-01-20 ENCOUNTER — Encounter: Payer: Self-pay | Admitting: Nurse Practitioner

## 2020-01-20 VITALS — BP 118/50 | HR 98 | Temp 98.2°F | Resp 20 | Ht 62.0 in | Wt 194.8 lb

## 2020-01-20 DIAGNOSIS — C50412 Malignant neoplasm of upper-outer quadrant of left female breast: Secondary | ICD-10-CM

## 2020-01-20 DIAGNOSIS — C787 Secondary malignant neoplasm of liver and intrahepatic bile duct: Secondary | ICD-10-CM | POA: Diagnosis not present

## 2020-01-20 DIAGNOSIS — Z7189 Other specified counseling: Secondary | ICD-10-CM

## 2020-01-20 DIAGNOSIS — C7800 Secondary malignant neoplasm of unspecified lung: Secondary | ICD-10-CM | POA: Diagnosis not present

## 2020-01-20 DIAGNOSIS — C7951 Secondary malignant neoplasm of bone: Secondary | ICD-10-CM | POA: Diagnosis not present

## 2020-01-20 DIAGNOSIS — Z170 Estrogen receptor positive status [ER+]: Secondary | ICD-10-CM

## 2020-01-20 DIAGNOSIS — C779 Secondary and unspecified malignant neoplasm of lymph node, unspecified: Secondary | ICD-10-CM | POA: Diagnosis not present

## 2020-01-20 DIAGNOSIS — Z5111 Encounter for antineoplastic chemotherapy: Secondary | ICD-10-CM | POA: Diagnosis not present

## 2020-01-20 LAB — CBC WITH DIFFERENTIAL/PLATELET
Abs Immature Granulocytes: 0.01 10*3/uL (ref 0.00–0.07)
Basophils Absolute: 0 10*3/uL (ref 0.0–0.1)
Basophils Relative: 1 %
Eosinophils Absolute: 0 10*3/uL (ref 0.0–0.5)
Eosinophils Relative: 1 %
HCT: 26.5 % — ABNORMAL LOW (ref 36.0–46.0)
Hemoglobin: 9.2 g/dL — ABNORMAL LOW (ref 12.0–15.0)
Immature Granulocytes: 0 %
Lymphocytes Relative: 36 %
Lymphs Abs: 1.1 10*3/uL (ref 0.7–4.0)
MCH: 29.4 pg (ref 26.0–34.0)
MCHC: 34.7 g/dL (ref 30.0–36.0)
MCV: 84.7 fL (ref 80.0–100.0)
Monocytes Absolute: 0.2 10*3/uL (ref 0.1–1.0)
Monocytes Relative: 8 %
Neutro Abs: 1.7 10*3/uL (ref 1.7–7.7)
Neutrophils Relative %: 54 %
Platelets: 226 10*3/uL (ref 150–400)
RBC: 3.13 MIL/uL — ABNORMAL LOW (ref 3.87–5.11)
RDW: 21.4 % — ABNORMAL HIGH (ref 11.5–15.5)
WBC: 3.2 10*3/uL — ABNORMAL LOW (ref 4.0–10.5)
nRBC: 0 % (ref 0.0–0.2)

## 2020-01-20 LAB — COMPREHENSIVE METABOLIC PANEL
ALT: 7 U/L (ref 0–44)
AST: 11 U/L — ABNORMAL LOW (ref 15–41)
Albumin: 3.7 g/dL (ref 3.5–5.0)
Alkaline Phosphatase: 89 U/L (ref 38–126)
Anion gap: 9 (ref 5–15)
BUN: 25 mg/dL — ABNORMAL HIGH (ref 8–23)
CO2: 24 mmol/L (ref 22–32)
Calcium: 9.5 mg/dL (ref 8.9–10.3)
Chloride: 106 mmol/L (ref 98–111)
Creatinine, Ser: 2.11 mg/dL — ABNORMAL HIGH (ref 0.44–1.00)
GFR calc Af Amer: 25 mL/min — ABNORMAL LOW (ref >60)
GFR calc non Af Amer: 22 mL/min — ABNORMAL LOW (ref >60)
Glucose, Bld: 184 mg/dL — ABNORMAL HIGH (ref 70–99)
Potassium: 3.9 mmol/L (ref 3.5–5.1)
Sodium: 139 mmol/L (ref 135–145)
Total Bilirubin: 0.3 mg/dL (ref 0.3–1.2)
Total Protein: 7.8 g/dL (ref 6.5–8.1)

## 2020-01-20 MED ORDER — SODIUM CHLORIDE 0.9 % IV SOLN
Freq: Once | INTRAVENOUS | Status: AC
  Filled 2020-01-20: qty 250

## 2020-01-20 MED ORDER — PROCHLORPERAZINE MALEATE 10 MG PO TABS
10.0000 mg | ORAL_TABLET | Freq: Once | ORAL | Status: AC
  Administered 2020-01-20: 10 mg via ORAL

## 2020-01-20 MED ORDER — PACLITAXEL PROTEIN-BOUND CHEMO INJECTION 100 MG
80.0000 mg/m2 | Freq: Once | INTRAVENOUS | Status: AC
  Administered 2020-01-20: 150 mg via INTRAVENOUS
  Filled 2020-01-20: qty 30

## 2020-01-20 MED ORDER — PROCHLORPERAZINE MALEATE 10 MG PO TABS
ORAL_TABLET | ORAL | Status: AC
  Filled 2020-01-20: qty 1

## 2020-01-20 NOTE — Progress Notes (Signed)
Pharmacist Chemotherapy Monitoring - Follow Up Assessment    I verify that I have reviewed each item in the below checklist:  . Regimen for the patient is scheduled for the appropriate day and plan matches scheduled date. Marland Kitchen Appropriate non-routine labs are ordered dependent on drug ordered. . If applicable, additional medications reviewed and ordered per protocol based on lifetime cumulative doses and/or treatment regimen.   Plan for follow-up and/or issues identified: No . I-vent associated with next due treatment: No . MD and/or nursing notified: No  Wendy [name] [facility] 01/20/2020 11:23 AM

## 2020-01-20 NOTE — Progress Notes (Signed)
Okay to treat today with elevated SCr today per Lacie. No Abraxane dose adjustments required. Patient will receive 500 mL normal saline today with treatment and Lacie educated her to hold one of her blood pressure medications at this time.   Demetrius Charity, PharmD, BCPS, [place] [facility] Pharmacist Pharmacy Phone: [phone] 01/20/2020

## 2020-01-20 NOTE — Progress Notes (Signed)
Wend[name]   [phone]) [phone] [phone]   Clinic Follow up Note   Patient Care Team: Marin Olp, MD as PCP - General (Family Medicine) Philemon Kingdom, MD as Consulting Physician (Internal Medicine) Jalene Mullet, MD as Consulting Physician (Ophthalmology) Erroll Luna, MD as Consulting Physician (General Surgery) Kyung Rudd, MD as Consulting Physician (Radiation Oncology) [name], MD as Consulting Physician (Hematology) Gardenia Phlegm, NP as Nurse Practitioner (Hematology and Oncology) 01/20/2020  CHIEF COMPLAINT: F/u metastatic breast cancer   SUMMARY OF ONCOLOGIC HISTORY: Oncology History Overview Note  Cancer Staging Malignant neoplasm of upper-outer quadrant of left breast in female, estrogen receptor positive ([place]) Staging form: Breast, AJCC 8th Edition - Clinical: Stage IIB (cT2, cN0, cM0, G3, ER: Positive, PR: Negative, HER2: Negative) - Signed by [name], MD on 11/23/2016 - Pathologic stage from 12/28/2016: Stage IIB (pT2, pN1a(sn), cM0, G3, ER: Positive, PR: Negative, HER2: Negative) - Signed by [name], MD on 01/02/2017     Malignant neoplasm of upper-outer quadrant of left breast in female, estrogen receptor positive ([place])  11/01/2016 Mammogram   Diagnostic mammogram and ultrasound of left breast and axilla showed a 3.1 x 2.1 x 1.4 cm (3.3 x 2.0 x 2.7 cm by ultrasound) mass in the upper outer quadrant of the anterior third of the left breast, associated with pleomorphic calcification. There is a 8 mm (1.8cm by [name]) prominent lymph node in the left axilla.   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   11/07/2016 Receptors her2   ER 80% positive, PR negative, HER-2 negative, Ki-67 90%   11/20/2016 Initial Diagnosis   Malignant neoplasm of upper-outer quadrant of left breast in female, estrogen receptor positive  ([place])   12/07/2016 Surgery   Left lumpectomy and left axillary sentinel lymph node sampling by Dr. Brantley Stage   12/07/2016 Pathology Results   pT2, pN1 Left Lumpectomy: Grade 3 IDC measuring 3.4 cm, carcinoma broadly present at the superior margin. Grade 3 DCIS. 1 out of 2 left axillary SLN positive for metastatic carcinoma   12/07/2016 Miscellaneous   Mammaprint showed high risk disease, basal type    12/28/2016 Surgery   Re-excision of the previously positive superior margin was negative for malignant cells.    01/04/2017 Surgery   Port inserted   01/18/2017 - 03/22/2017 Chemotherapy   Adjuvant Docetaxel 75 mg/m and Cytoxan 600 mg/m, every 21 days, for total of 4 cycles, with Neulasta on day 2.    04/23/2017 - 05/18/2017 Radiation Therapy   Radiation treatment dates:   04/23/17 - 05/18/17 Administered by Dr. Lisbeth Renshaw  Site/dose:    Left breast/ 42.5 Gy in 17 Fx Boost / 7.5 Gy in 3 Fx   06/12/2017 - 08/14/2019 Anti-estrogen oral therapy   Adjuvant letrozole 1 mg daily, plan for 5-7 years. D/c on 08/14/19 due to local recurrence of left axilla.     07/20/2017 Mammogram   IMPRESSION: No mammographic evidence of malignancy in either breast. 3.8 cm left breast postsurgical loculated seroma.   09/12/2017 Survivorship     09/19/2018 Imaging   Baseline DEXA 09/19/18 ASSESSMENT: The BMD measured at Femur Neck Left is 0.888 g/cm2 with a T-score of -1.1. This patient is considered osteopenic according to [facility]) criteria.   The scan quality is good. L-4 was excluded due to degenerative changes.   Site Region Measured Date Measured Age [age]A BMD Significant CHANGE  T-score AP Spine  L1-L3      09[age]19/[age]         -0.7    1.084 g/cm2   DualFemur Neck Left  09/19/2018    [age]         -1.1    0.888 g/cm2   DualFemur Total Mean 09/19/2018    [age]         -0.5    0.946 g/cm2 ASSESSMENT: The probability of a major osteoporotic fracture is 4.5 % within the next ten  years.   The probability of hip fracture is 0.8  % within the next 10 years.   06/25/2019 Mammogram   Diagnostic mammogram 06/25/19  IMPRESSION: 1.  Two morphologically abnormal lymph nodes in the left axilla with a lymph node in the lower left axilla measuring 2.4 x 2 x 2.6 cm.   2. Stable lumpectomy changes left breast with no findings of malignancy in either breast.   07/01/2019 Pathology Results   Diagnosis Lymph node, needle/core biopsy, left axilla - POORLY DIFFERENTIATED CARCINOMA CONSISTENT WITH BREAST PRIMARY. - NO LYMPHOID NODAL TISSUE IDENTIFIED. - SEE MICROSCOPIC DESCRIPTION. Microscopic Comment The carcinoma is consistent with grade III.  PROGNOSTIC INDICATORS Results: IMMUNOHISTOCHEMICAL AND MORPHOMETRIC ANALYSIS PERFORMED MANUALLY The tumor cells are NEGATIVE for Her2 (1+). Estrogen Receptor: 80%, POSITIVE, WEAK STAINING INTENSITY Progesterone Receptor: 0%, NEGATIVE Proliferation Marker Ki67: 80%   07/18/2019 Breast MRI   Breast MRI 07/18/19  IMPRESSION: 1. Two morphologically abnormal level 1 left axillary lymph nodes correlating with biopsy-proven malignancy. 2. No MRI evidence of malignancy in either breast. 3. Postsurgical changes of the left breast consistent with prior lumpectomy.   07/25/2019 PET scan   PET 07/25/19  IMPRESSION: 1. Hypermetabolic LEFT axillary mass consistent with metastatic breast cancer. 2. High LEFT axillary/posterior triangle (level 5) lymph node consistent with local metastasis. 3. No central mediastinal nodal metastasis. No suspicious pulmonary nodules. 4. No distant soft tissue metastasis or skeletal metastasis. 5. Calcified fibroid uterus   08/05/2019 Surgery   LEFT AXILLARY LYMPH NODE DISSECTION by Dr. Brantley Stage 08/05/19    08/05/2019 Pathology Results   FINAL MICROSCOPIC DIAGNOSIS: 08/05/19    A. LYMPH NODES, LEFT AXILLARY, DISSECTION:  - Metastatic carcinoma.  - See comment.    08/2019 - 01/13/2020 Anti-estrogen oral  therapy   Exemestane 50m starting 08/2019. Stopped with the start of chemo in 01/2020   09/01/2019 Genetic Testing   [employer] [facility] Genomic Findings:  -RET amplification -CDK8 amplification -NOTCH1 deletion  -TZO10splice site [id number]>V  14/09/8119Imaging   CT Angio Chest   IMPRESSION: 1. No definitive pulmonary embolism. 2. Multiple new small bilateral pulmonary nodules consistent with metastatic disease. 3. Soft tissue density in the left axilla at the site of node dissection, probably representing scarring but I cannot exclude tumor recurrence. 4. New partially healed fracture of the posterior aspect of the right eighth rib.   12/04/2019 - 01/13/2020 Chemotherapy   Verzenio 1544mBID starting 12/04/19. Stopped a week before the start of chemo in 01/2020   01/07/2020 PET scan   IMPRESSION: 1. Marked worsening of disease with soft tissue recurrence in the left axilla, new and enlarging lymph nodes in the supraclavicular/high axillary region, new mediastinal adenopathy and new pulmonary pleural and parenchymal disease. 2. Metastatic disease to the right hepatic lobe. 3. Signs of bony metastasis. 4. New pleural effusions presumably malignant and associated with pleural nodularity.   01/20/2020 -  Chemotherapy   The patient had PACLitaxel-protein bound (ABRAXANE)  chemo infusion 150 mg, 80 mg/m2 = 150 mg, Intravenous,  Once, 0 of 4 cycles  for chemotherapy treatment.    01/21/2020 - 01/21/2020 Chemotherapy   The patient had PACLitaxel (TAXOL) 156 mg in sodium chloride 0.9 % 250 mL chemo infusion (</= 81m/m2), 80 mg/m2, Intravenous,  Once, 0 of 4 cycles  for chemotherapy treatment.      CURRENT THERAPY: starting systemic chemotherapy, Abraxane on days 1, 8, 15 q28 days   INTERVAL HISTORY: Ms. JEgereturns for f/u and treatment as scheduled. She feels fine. She has soreness in the left axilla, takes tramadol once per week. Left arm edema is unchanged, she occasionally wears arm wrap.  She stopped verzenio and aromasin a week ago, appetite has improved. She is drinking adequately. Denies mucositis. Has occasional loose stool, once yesterday. Not much nasuea which is controlled with PRN zofran/compazine. Her cough and exertional dyspnea are stable, uses respiratory meds. She remains functional and active at home, son lives with her. Denies neuropathy but index finger of right hand feels tight. She has itching in her perineal area, denies redness. She notes she wears a pull up at night due to nocturia. She attributes this to DM, off metformin recently per provider. BG was 192 this morning.   MEDICAL HISTORY:  Past Medical History:  Diagnosis Date  . Abnormal breast finding 2018   per pt/ having  a lot drainage from left breast nipple  . Anemia   . Anxiety   . [place]   [place]/  . CKD (chronic kidney disease)   . Depression   . GERD (gastroesophageal reflux disease)    TUMS as needed  . HTN (hypertension)    states BP has been high recently; has been on med. x 20 yr.  . Hyperlipidemia   . Hyperplastic colon polyp   . HYPERTENSION, BENIGN SYSTEMIC 11/29/2006   Lisinopril hctz 10-12.532m amlodipine 1037mcoreg 53m74mD, clonidine 0.3 mg BID.   . InMarland Kitchenulin dependent diabetes mellitus   . Personal history of chemotherapy    2018/finished 6 weeks of chemo in Sep 2018  . Personal history of radiation therapy    2018 left breast/finished radiation in Sept 2018 per pt.    SURGICAL HISTORY: Past Surgical History:  Procedure Laterality Date  . AXILLARY LYMPH NODE DISSECTION Left 08/05/2019   Procedure: LEFT AXILLARY LYMPH NODE DISSECTION;  Surgeon: CornErroll Luna;  [facility];  Laterality: Left;  . BREAST LUMPECTOMY Left 05/05/2008  . BREAST LUMPECTOMY Left 12/07/2016   malignant  . BREAST LUMPECTOMY WITH RADIOACTIVE SEED AND SENTINEL LYMPH NODE BIOPSY Left 12/07/2016   Procedure: LEFT BREAST LUMPECTOMY WITH  RADIOACTIVE SEED AND SENTINEL LYMPH NODE BIOPSY;  Surgeon: ThomErroll Luna;  [place];  Laterality: Left;  . IR FLUORO GUIDE PORT INSERTION RIGHT  01/04/2017  . IR US G[place]DE VASC ACCESS RIGHT  01/04/2017  . PORT-A-CATH REMOVAL N/A 05/30/2017   Procedure: REMOVAL PORT-A-CATH;  Surgeon: CornErroll Luna;  [facility];  Laterality: N/A;  . RE-EXCISION OF BREAST LUMPECTOMY Left 12/28/2016   Procedure: RE-EXCISION OF BREAST LUMPECTOMY;  Surgeon: ThomErroll Luna;  [facility] [place];  Laterality: Left;    I have reviewed the social history and family history with the patient and they are unchanged from previous note.  ALLERGIES:  has No Known Allergies.  MEDICATIONS:  Current Outpatient Medications  Medication Sig Dispense Refill  . abemaciclib (VERZENIO) 150 MG tablet Take 1 tablet (150 mg total) by mouth 2 (two) times daily. Swallow tablets whole. Do not chew, crush, or split tablets before swallowing. 60 tablet 1  . albuterol (VENTOLIN HFA) 108 (90 Base) MCG/ACT inhaler Inhale 2 puffs into the lungs every 6 (six) hours as needed for wheezing or shortness of breath. 18 g 2  . amLODipine (NORVASC) 10 MG tablet TAKE 1 TABLET BY MOUTH EVERY DAY 90 tablet 1  . B-D INS SYRINGE 0.5CC/31GX5/16 31G X 5/16" 0.5 ML MISC USE AS DIRECTED 4 TIMES A DAY 300 each 1  . busPIRone (BUSPAR) 10 MG tablet Take 1 tablet (10 mg total) by mouth 3 (three) times daily. 270 tablet 1  . carvedilol (COREG) 25 MG tablet Take 1 tablet (25 mg total) by mouth 2 (two) times daily with a meal. 180 tablet 3  . cloNIDine (CATAPRES) 0.3 MG tablet TAKE 1 TABLET (0.3 MG TOTAL) BY MOUTH 2 (TWO) TIMES DAILY. 180 tablet 1  . fluticasone (FLONASE) 50 MCG/ACT nasal spray Place 2 sprays into both nostrils daily. 16 g 3  . fluticasone furoate-vilanterol (BREO ELLIPTA) 200-25 MCG/INH AEPB Inhale 1 puff into the lungs daily. 60 each 3  .  fluticasone furoate-vilanterol (BREO ELLIPTA) 200-25 MCG/INH AEPB Inhale 1 puff into the lungs daily. 2 each 0  . HYDROcodone-acetaminophen (NORCO/VICODIN) 5-325 MG tablet Take 1 tablet by mouth every 8 (eight) hours as needed for moderate pain. 15 tablet 0  . Insulin Glargine (LANTUS SOLOSTAR) 100 UNIT/ML Solostar Pen Inject 28 units into the skin in am (Patient taking differently: 26 Units. Inject 28 units into the skin in am) 10 pen 3  . Insulin Pen Needle 31G X 5 MM MISC Use pen needles for insulin injection daily 100 each 3  . insulin regular (NOVOLIN R) 100 units/mL injection Inject 0.14-0.18 mLs (14-18 Units total) into the skin 3 (three) times daily before meals. Relion. Please provide insulin syringes needed (Patient taking differently: Inject 10-16 Units into the skin 3 (three) times daily before meals. Relion. Please provide insulin syringes needed) 20 mL 11  . Insulin Syringe-Needle U-100 (B-D INS SYRINGE 0.5CC/30GX1/2") 30G X 1/2" 0.5 ML MISC USE AS DIRECTED 4 TIMES A DAY 300 each 3  . lidocaine-prilocaine (EMLA) cream Apply to affected area once 30 g 3  . lisinopril-hydrochlorothiazide (ZESTORETIC) 20-12.5 MG tablet Take 1 tablet by mouth daily. 90 tablet 3  . lovastatin (MEVACOR) 40 MG tablet TAKE 1 TABLET BY MOUTH EVERYDAY AT BEDTIME 90 tablet 3  . mirtazapine (REMERON) 7.5 MG tablet Take 1 tablet (7.5 mg total) by mouth at bedtime. 30 tablet 0  . ondansetron (ZOFRAN) 8 MG tablet Take 1 tablet (8 mg total) by mouth 2 (two) times daily as needed (Nausea or vomiting). 30 tablet 1  . OVER THE COUNTER MEDICATION Take 1 tablet by mouth daily as needed. Stool softner for constipation, also drinks warm prune juice prn    . polyethylene glycol powder (GLYCOLAX/MIRALAX) 17 GM/SCOOP powder Take 17 g by mouth daily. 1 g 0  . prochlorperazine (COMPAZINE) 10 MG tablet Take 1 tablet (10 mg total) by mouth every 6 (six) hours as needed for nausea or vomiting. 30 tablet 2  . spironolactone (ALDACTONE)  25 MG tablet Take 0.5 tablets (12.5 mg total) by mouth daily. 15 tablet 3  . traMADol (ULTRAM) 50 MG tablet TAKE 1 TABLET BY MOUTH EVERY 6 HOURS AS NEEDED FOR MODERATE/SEVERE PAIN (NECK AND SHOULDER  PAIN). 20 tablet 0  . aspirin 81 MG tablet Take 81 mg by mouth daily.      Marland Kitchen exemestane (AROMASIN) 25 MG tablet TAKE 1 TABLET BY MOUTH ONCE A DAY AFTER BREAKFAST 30 tablet 1  . metFORMIN (GLUCOPHAGE) 500 MG tablet TAKE 2 TABLETS BY MOUTH DAILY WITH SUPPER. (Patient taking differently: 500 mg 2 (two) times daily with a meal. ) 180 tablet 2   Current Facility-Administered Medications  Medication Dose Route Frequency Provider Last Rate Last Admin  . 0.9 %  sodium chloride infusion   Intravenous Once Alla Feeling, NP        PHYSICAL EXAMINATION: ECOG PERFORMANCE STATUS: 1-2   Vitals:   01/20/20 0825 01/20/20 0840  BP: (!) 174/73 (!) 118/50  Pulse: 98   Resp: 20   Temp: 98.2 F (36.8 C)   SpO2: 100%    Filed Weights   01/20/20 0825  Weight: 194 lb 12.8 oz (88.4 kg)    GENERAL:alert, no distress and comfortable SKIN: no obvious rash EYES: sclera clear LYMPH:  no palpable cervical or supraclavicular lymphadenopathy. At least 3 cm palpable left axillary LN. Left arm lymphedema  LUNGS: clear with normal breathing effort HEART: regular rate & rhythm, trace bilateral lower extremity edema ABDOMEN:  normal bowel sounds NEURO: alert & oriented x 3 with fluent speech  LABORATORY DATA:  I have reviewed the data as listed CBC Latest Ref Rng & Units 01/20/2020 12/11/2019 10/30/2019  WBC 4.0 - 10.5 K/uL 3.2(L) 5.4 8.3  Hemoglobin 12.0 - 15.0 g/dL 9.2(L) 10.7(L) 11.8(L)  Hematocrit 36.0 - 46.0 % 26.5(L) 31.8(L) 34.7(L)  Platelets 150 - 400 K/uL 226 277 333.0     CMP Latest Ref Rng & Units 01/20/2020 12/11/2019 11/07/2019  Glucose 70 - 99 mg/dL 184(H) 143(H) 322(H)  BUN 8 - 23 mg/dL 25(H) 38(H) 31(H)  Creatinine 0.44 - 1.00 mg/dL 2.11(H) 2.63(H) 1.66(H)  Sodium 135 - 145 mmol/L 139 135 133(L)    Potassium 3.5 - 5.1 mmol/L 3.9 5.5(H) 4.4  Chloride 98 - 111 mmol/L 106 102 98  CO2 22 - 32 mmol/L 24 25 25   Calcium 8.9 - 10.3 mg/dL 9.5 9.2 8.9  Total Protein 6.5 - 8.1 g/dL 7.8 7.2 -  Total Bilirubin 0.3 - 1.2 mg/dL 0.3 0.4 -  Alkaline Phos 38 - 126 U/L 89 109 -  AST 15 - 41 U/L 11(L) 9(L) -  ALT 0 - 44 U/L 7 <6 -      RADIOGRAPHIC STUDIES: I have personally reviewed the radiological images as listed and agreed with the findings in the report. No results found.   ASSESSMENT & PLAN: KYLEE UMANA is a 79 y.o. female with    1. Malignant neoplasm of upper-outer quadrant of left breast, Invasive Ductal Carcinoma, pT2pN1M0, stage IIB, ER+/PR-/HER2-, G3, mammaprint high risk, axillarynodesrecurrence in 06/2019, lung nodules in 10/2019, metastatic to lung, bone, LNs, liver in 01/2020 -She was initiallydiagnosed in 11/2016. She was treated with left lumpectomy and SLN sampling,re-excision surgery, Adjuvant TC for 4 cycles andadjuvantradiation.  -She was on adjuvant Letrozole from 06/2017-08/14/19 -Unfortunately she had local recurrence in left axilla and underwent Left axillary lymph node dissection by Dr. Brantley Stage on 08/05/19.She had complete resection. -She continued Exemestane and addedoral biological agent Verzenio 115m BID starting3/4/21.  -PET scan from 01/07/20 showed wide spread disease with soft tissue recurrence in the left axilla, enlarging LNs in chest, new lung pleural and parenchymal disease, metastatic disease of the right liver lobe and signs of  bone metastasis. There is also new pleural effusions presumably malignant and associated with pleural nodularity. She had developed fatigue, dyspnea, and anorexia lately, likely from her metastatic disease. -Based on the scan findings, Dr. Burr Medico did not recommend biopsy given the clinical picture.  - FO results showed RET, CDK8, NOTCH1, TP53 mutations.No targetable mutation for now.  -Due to the rapid disease progression through AI  and CDK4/6 inhibitor, and multiple visceral organ involvement, Dr. Burr Medico recommended single agent Abraxane 56m/m2 weekly, 3 weeks on/1 week off for 3-6 months then repeat PET scan. The goal is palliative, to control her disease, prolong her life, and improve quality of life.  -we again reviewed the rationale, goal, potential side effects and symptom management today. She agrees to proceed.  -Ms. Bardsley appears stable. She stopped verzenio and exemestane a week ago, her appetite improved. She remains functional at home. She has occasional loose stool, I recommend imodium PRN. She knows to stay adequately hydrated.  -Labs reviewed, CBC is stable. CMP shows BG 184, Cr 2.11. I recommend to hold lisinopril-HCTZ for now. BG 118/50. She will get 500 cc NS with treatment today. No dose reduction for abraxane is required.  -she will proceed with cycle 1 day 1 abraxane today. She will return 4/26 for f/u and day 8.   2. Low appetite, nausea, B/l abdominal pain, Diarrhea/constipation -She has mild intermittent nausea which is controlled with antiemetics, and occasional abdominal pain and occasional diarrhea/constipation.  -She has metamucil and prune juice as needed which helps her bowels. Due to recent loose stool, I encouraged her to use imodium PRN and remain adequately hydrated.  -She can use Glucerna to help her maintain weight.  -Dr. FBurr Medicocalled in Mirtazapine 7.558mon 01/08/20 to help her appetite, which improved organically when she stopped verzenio and exemestane. She has not started appetite stimulant yet. I encouraged her to do so if appetite decreases after starting abraxane.   3. Dry cough and SOB, lung metastasis -her baseline cough and exertional dyspnea progressed lately around the time she had her PET scan, but stable since then -Her PCP referred her to pulmonologist Dr. MaVaughan Brownerho is in the process of adjusting inhalers/respiratory meds  -She has receivedboth herCOVID19 vaccinations. -Her  PET from 01/07/20 showed new pulmonary pleural and parenchymal disease and presumably malignant pleural effusions. Overall indications of her breast cancer metastasized to lung.   4. Left arm numbness/tinglingandLeft arm lymphedema, secondary to surgery. -secondary to malignant left axillary adenopathy, s/p ALND in 08/2019 -She still has left armand handlymphedema.  -She is currently doing PT 3 times a week and wrapping her arm -will use right arm for chemo today until she gets PAC placed   5. HTN, DM, CKD III -F/u with PCPand endocrinologist.  -DM is not well controlled. she is on insulin. Patient states a provider told her to hold metformin for now.  -BG 184 this morning, Cr 2.11. I encouraged her to manage DM and increase oral hydration. She will get NS with treatment today. I also recommend to hold lisinopril-HCTZ until renal function improves. BP 118/50 today  6. Mild anemia -Iron and TIBC, B12, MMA normal -mildand stable.  7. Osteopenia  -09/2018 DEXA shows osteopeniawith lowest T-score -1.1 -She recently stopped AI due to disease progression -Next DEXA in 09/2020  8. Financial and Social Support  -She paid $165 out of pocket for her exemestane, which she stopped in 01/2020 due to disease progression -Can contact financial advocate for copay assistance grant as needed -She  gets help with her son but he is looking to return to working.  -Sw assistance as needed   9. Goal of care discussion  -we reviewed that due to her rapid disease progression, her cancer is not curable with chemo alone at this point. The goal is palliative, to prolong her life and improve quality of life. She understands and agrees.  -The patient understands the goal of care is palliative. -no code status on file. Dr. Burr Medico has recommended DNR/DNI previously. Patient is thinking about it   PLAN: -Labs reviewed -Proceed with cycle 1 day 1 abraxane 80 mg/m2, no dose reduction required for  renal dysfunction per pharmacy -Reviewed goal of care, potential side effects, symptom management, and when to call clinic  -500 cc NS with treatment  -Hold lisinopril-HCTZ for now -F/u next week with C1D8 chemo  All questions were answered. The patient knows to call the clinic with any problems, questions or concerns. No barriers to learning were detected. Total encounter time was 30 minutes.      Alla Feeling, NP 01/20/20

## 2020-01-20 NOTE — Patient Instructions (Signed)
Nanoparticle Albumin-Bound Paclitaxel injection What is this medicine? NANOPARTICLE ALBUMIN-BOUND PACLITAXEL (Na no PAHR ti kuhl al BYOO muhn-bound PAK li TAX el) is a chemotherapy drug. It targets fast dividing cells, like cancer cells, and causes these cells to die. This medicine is used to treat advanced breast cancer, lung cancer, and pancreatic cancer. This medicine may be used for other purposes; ask your health care provider or pharmacist if you have questions. COMMON BRAND NAME(S): Abraxane What should I tell my health care provider before I take this medicine? They need to know if you have any of these conditions:  kidney disease  liver disease  low blood counts, like low white cell, platelet, or red cell counts  lung or breathing disease, like asthma  tingling of the fingers or toes, or other nerve disorder  an unusual or allergic reaction to paclitaxel, albumin, other chemotherapy, other medicines, foods, dyes, or preservatives  pregnant or trying to get pregnant  breast-feeding How should I use this medicine? This drug is given as an infusion into a vein. It is administered in a hospital or clinic by a specially trained health care professional. Talk to your pediatrician regarding the use of this medicine in children. Special care may be needed. Overdosage: If you think you have taken too much of this medicine contact a poison control center or emergency room at once. NOTE: This medicine is only for you. Do not share this medicine with others. What if I miss a dose? It is important not to miss your dose. Call your doctor or health care professional if you are unable to keep an appointment. What may interact with this medicine? This medicine may interact with the following medications:  antiviral medicines for hepatitis, HIV or AIDS  certain antibiotics like erythromycin and clarithromycin  certain medicines for fungal infections like ketoconazole and  itraconazole  certain medicines for seizures like carbamazepine, phenobarbital, phenytoin  gemfibrozil  nefazodone  rifampin  St. John's wort This list may not describe all possible interactions. Give your health care provider a list of all the medicines, herbs, non-prescription drugs, or dietary supplements you use. Also tell them if you smoke, drink alcohol, or use illegal drugs. Some items may interact with your medicine. What should I watch for while using this medicine? Your condition will be monitored carefully while you are receiving this medicine. You will need important blood work done while you are taking this medicine. This medicine can cause serious allergic reactions. If you experience allergic reactions like skin rash, itching or hives, swelling of the face, lips, or tongue, tell your doctor or health care professional right away. In some cases, you may be given additional medicines to help with side effects. Follow all directions for their use. This drug may make you feel generally unwell. This is not uncommon, as chemotherapy can affect healthy cells as well as cancer cells. Report any side effects. Continue your course of treatment even though you feel ill unless your doctor tells you to stop. Call your doctor or health care professional for advice if you get a fever, chills or sore throat, or other symptoms of a cold or flu. Do not treat yourself. This drug decreases your body's ability to fight infections. Try to avoid being around people who are sick. This medicine may increase your risk to bruise or bleed. Call your doctor or health care professional if you notice any unusual bleeding. Be careful brushing and flossing your teeth or using a toothpick because you may   get an infection or bleed more easily. If you have any dental work done, tell your dentist you are receiving this medicine. Avoid taking products that contain aspirin, acetaminophen, ibuprofen, naproxen, or  ketoprofen unless instructed by your doctor. These medicines may hide a fever. Do not become pregnant while taking this medicine or for 6 months after stopping it. Women should inform their doctor if they wish to become pregnant or think they might be pregnant. Men should not father a child while taking this medicine or for 3 months after stopping it. There is a potential for serious side effects to an unborn child. Talk to your health care professional or pharmacist for more information. Do not breast-feed an infant while taking this medicine or for 2 weeks after stopping it. This medicine may interfere with the ability to get pregnant or to father a child. You should talk to your doctor or health care professional if you are concerned about your fertility. What side effects may I notice from receiving this medicine? Side effects that you should report to your doctor or health care professional as soon as possible:  allergic reactions like skin rash, itching or hives, swelling of the face, lips, or tongue  breathing problems  changes in vision  fast, irregular heartbeat  low blood pressure  mouth sores  pain, tingling, numbness in the hands or feet  signs of decreased platelets or bleeding - bruising, pinpoint red spots on the skin, black, tarry stools, blood in the urine  signs of decreased red blood cells - unusually weak or tired, feeling faint or lightheaded, falls  signs of infection - fever or chills, cough, sore throat, pain or difficulty passing urine  signs and symptoms of liver injury like dark yellow or brown urine; general ill feeling or flu-like symptoms; light-colored stools; loss of appetite; nausea; right upper belly pain; unusually weak or tired; yellowing of the eyes or skin  swelling of the ankles, feet, hands  unusually slow heartbeat Side effects that usually do not require medical attention (report to your doctor or health care professional if they continue or  are bothersome):  diarrhea  hair loss  loss of appetite  nausea, vomiting  tiredness This list may not describe all possible side effects. Call your doctor for medical advice about side effects. You may report side effects to FDA at 1-800-FDA-1088. Where should I keep my medicine? This drug is given in a hospital or clinic and will not be stored at home. NOTE: This sheet is a summary. It may not cover all possible information. If you have questions about this medicine, talk to your doctor, pharmacist, or health care provider.  2020 [employer] (2017-05-22 13:03:45)  

## 2020-01-25 NOTE — Progress Notes (Addendum)
[name]   [phone] [phone]   Clinic Follow up Note   Patient Care Team: Marin Olp, MD as PCP - General (Family Medicine) Philemon Kingdom, MD as Consulting Physician (Internal Medicine) Jalene Mullet, MD as Consulting Physician (Ophthalmology) Erroll Luna, MD as Consulting Physician (General Surgery) Kyung Rudd, MD as Consulting Physician (Radiation Oncology) [name], MD as Consulting Physician (Hematology) Gardenia Phlegm, NP as Nurse Practitioner (Hematology and Oncology) 01/26/2020  CHIEF COMPLAINT: F/u metastatic breast cancer   SUMMARY OF ONCOLOGIC HISTORY: Oncology History Overview Note  Cancer Staging Malignant neoplasm of upper-outer quadrant of left breast in female, estrogen receptor positive ([place]) Staging form: Breast, AJCC 8th Edition - Clinical: Stage IIB (cT2, cN0, cM0, G3, ER: Positive, PR: Negative, HER2: Negative) - Signed by [name], MD on 11/23/2016 - Pathologic stage from 12/28/2016: Stage IIB (pT2, pN1a(sn), cM0, G3, ER: Positive, PR: Negative, HER2: Negative) - Signed by [name], MD on 01/02/2017     Malignant neoplasm of upper-outer quadrant of left breast in female, estrogen receptor positive ([place])  11/01/2016 Mammogram   Diagnostic mammogram and ultrasound of left breast and axilla showed a 3.1 x 2.1 x 1.4 cm (3.3 x 2.0 x 2.7 cm by ultrasound) mass in the upper outer quadrant of the anterior third of the left breast, associated with pleomorphic calcification. There is a 8 mm (1.8cm by [name]) prominent lymph node in the left axilla.   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   11/07/2016 Receptors her2   ER 80% positive, PR negative, HER-2 negative, Ki-67 90%   11/20/2016 Initial Diagnosis   Malignant neoplasm of upper-outer quadrant of left breast in female, estrogen receptor positive  ([place])   12/07/2016 Surgery   Left lumpectomy and left axillary sentinel lymph node sampling by Dr. Brantley Stage   12/07/2016 Pathology Results   pT2, pN1 Left Lumpectomy: Grade 3 IDC measuring 3.4 cm, carcinoma broadly present at the superior margin. Grade 3 DCIS. 1 out of 2 left axillary SLN positive for metastatic carcinoma   12/07/2016 Miscellaneous   Mammaprint showed high risk disease, basal type    12/28/2016 Surgery   Re-excision of the previously positive superior margin was negative for malignant cells.    01/04/2017 Surgery   Port inserted   01/18/2017 - 03/22/2017 Chemotherapy   Adjuvant Docetaxel 75 mg/m and Cytoxan 600 mg/m, every 21 days, for total of 4 cycles, with Neulasta on day 2.    04/23/2017 - 05/18/2017 Radiation Therapy   Radiation treatment dates:   04/23/17 - 05/18/17 Administered by Dr. Lisbeth Renshaw  Site/dose:    Left breast/ 42.5 Gy in 17 Fx Boost / 7.5 Gy in 3 Fx   06/12/2017 - 08/14/2019 Anti-estrogen oral therapy   Adjuvant letrozole 1 mg daily, plan for 5-7 years. D/c on 08/14/19 due to local recurrence of left axilla.     07/20/2017 Mammogram   IMPRESSION: No mammographic evidence of malignancy in either breast. 3.8 cm left breast postsurgical loculated seroma.   09/12/2017 Survivorship     09/19/2018 Imaging   Baseline DEXA 09/19/18 ASSESSMENT: The BMD measured at Femur Neck Left is 0.888 g/cm2 with a T-score of -1.1. This patient is considered osteopenic according to [facility]) criteria.   The scan quality is good. L-4 was excluded due to degenerative changes.   Site Region Measured Date Measured Age [age]A BMD Significant CHANGE  T-score AP Spine  L1-L3      09/19/[age]         -0.7    1.084 g/cm2   DualFemur Neck Left  09/19/2018    [age]         -1.1    0.888 g/cm2   DualFemur Total Mean 09/19/2018    [age]         -0.5    0.946 g/cm2 ASSESSMENT: The probability of a major osteoporotic fracture is 4.5 % within the next ten  years.   The probability of hip fracture is 0.8  % within the next 10 years.   06/25/2019 Mammogram   Diagnostic mammogram 06/25/19  IMPRESSION: 1.  Two morphologically abnormal lymph nodes in the left axilla with a lymph node in the lower left axilla measuring 2.4 x 2 x 2.6 cm.   2. Stable lumpectomy changes left breast with no findings of malignancy in either breast.   07/01/2019 Pathology Results   Diagnosis Lymph node, needle/core biopsy, left axilla - POORLY DIFFERENTIATED CARCINOMA CONSISTENT WITH BREAST PRIMARY. - NO LYMPHOID NODAL TISSUE IDENTIFIED. - SEE MICROSCOPIC DESCRIPTION. Microscopic Comment The carcinoma is consistent with grade III.  PROGNOSTIC INDICATORS Results: IMMUNOHISTOCHEMICAL AND MORPHOMETRIC ANALYSIS PERFORMED MANUALLY The tumor cells are NEGATIVE for Her2 (1+). Estrogen Receptor: 80%, POSITIVE, WEAK STAINING INTENSITY Progesterone Receptor: 0%, NEGATIVE Proliferation Marker Ki67: 80%   07/18/2019 Breast MRI   Breast MRI 07/18/19  IMPRESSION: 1. Two morphologically abnormal level 1 left axillary lymph nodes correlating with biopsy-proven malignancy. 2. No MRI evidence of malignancy in either breast. 3. Postsurgical changes of the left breast consistent with prior lumpectomy.   07/25/2019 PET scan   PET 07/25/19  IMPRESSION: 1. Hypermetabolic LEFT axillary mass consistent with metastatic breast cancer. 2. High LEFT axillary/posterior triangle (level 5) lymph node consistent with local metastasis. 3. No central mediastinal nodal metastasis. No suspicious pulmonary nodules. 4. No distant soft tissue metastasis or skeletal metastasis. 5. Calcified fibroid uterus   08/05/2019 Surgery   LEFT AXILLARY LYMPH NODE DISSECTION by Dr. Brantley Stage 08/05/19    08/05/2019 Pathology Results   FINAL MICROSCOPIC DIAGNOSIS: 08/05/19    A. LYMPH NODES, LEFT AXILLARY, DISSECTION:  - Metastatic carcinoma.  - See comment.    08/2019 - 01/13/2020 Anti-estrogen oral  therapy   Exemestane 38m starting 08/2019. Stopped with the start of chemo in 01/2020   09/01/2019 Genetic Testing   [facility] [employer] Findings:  -RET amplification -CDK8 amplification -NOTCH1 deletion  -TKA76splice site [id number]>[id number]  12/62/0355Imaging   CT Angio Chest   IMPRESSION: 1. No definitive pulmonary embolism. 2. Multiple new small bilateral pulmonary nodules consistent with metastatic disease. 3. Soft tissue density in the left axilla at the site of node dissection, probably representing scarring but I cannot exclude tumor recurrence. 4. New partially healed fracture of the posterior aspect of the right eighth rib.   12/04/2019 - 01/13/2020 Chemotherapy   Verzenio 1564mBID starting 12/04/19. Stopped a week before the start of chemo in 01/2020   01/07/2020 PET scan   IMPRESSION: 1. Marked worsening of disease with soft tissue recurrence in the left axilla, new and enlarging lymph nodes in the supraclavicular/high axillary region, new mediastinal adenopathy and new pulmonary pleural and parenchymal disease. 2. Metastatic disease to the right hepatic lobe. 3. Signs of bony metastasis. 4. New pleural effusions presumably malignant and associated with pleural nodularity.   01/20/2020 -  Chemotherapy   The patient had PACLitaxel-protein bound (ABRAXANE)  chemo infusion 150 mg, 80 mg/m2 = 150 mg, Intravenous,  Once, 1 of 4 cycles Administration: 150 mg (01/20/2020)  for chemotherapy treatment.    01/21/2020 - 01/21/2020 Chemotherapy   The patient had PACLitaxel (TAXOL) 156 mg in sodium chloride 0.9 % 250 mL chemo infusion (</= 76m/m2), 80 mg/m2, Intravenous,  Once, 0 of 4 cycles  for chemotherapy treatment.      CURRENT THERAPY: Systemic chemotherapy, Abraxane on days 1, 8, 15 q28 days; started 01/20/20   INTERVAL HISTORY: Ms. JHoldsworthreturns for f/u and C1D8 Abraxane as scheduled. The treatment itself went fine. No n/v/c/d. Her appetite is improved. Denies mucositis. Left  axilla is occasionally sore. Denies neuropathy except right hand "tightness." Denies fever, chills. Over the last week she has worsening exertional dyspnea. She cannot walk more than a few steps without getting very out of breath. This is limiting ADLs and activity level, she can't do much. For past 2-3 days she is coughing and sneezing. This is scaring her and she's tearful. The left arm seems larger to her, has known lymphedema. Denies leg edema.    MEDICAL HISTORY:  Past Medical History:  Diagnosis Date  . Abnormal breast finding 2018   per pt/ having  a lot drainage from left breast nipple  . Anemia   . Anxiety   . [place]   left/  . CKD (chronic kidney disease)   . Depression   . GERD (gastroesophageal reflux disease)    TUMS as needed  . HTN (hypertension)    states BP has been high recently; has been on med. x 20 yr.  . Hyperlipidemia   . Hyperplastic colon polyp   . HYPERTENSION, BENIGN SYSTEMIC 11/29/2006   Lisinopril hctz 10-12.55m amlodipine 1076mcoreg 100m82mD, clonidine 0.3 mg BID.   . InMarland Kitchenulin dependent diabetes mellitus   . Personal history of chemotherapy    2018/finished 6 weeks of chemo in Sep 2018  . Personal history of radiation therapy    2018 left breast/finished radiation in Sept 2018 per pt.    SURGICAL HISTORY: Past Surgical History:  Procedure Laterality Date  . AXILLARY LYMPH NODE DISSECTION Left 08/05/2019   Procedure: LEFT AXILLARY LYMPH NODE DISSECTION;  Surgeon: CornErroll Luna;  [place] [facility] [place];  Laterality: Left;  . BREAST LUMPECTOMY Left 05/05/2008  . BREAST LUMPECTOMY Left 12/07/2016   malignant  . BREAST LUMPECTOMY WITH RADIOACTIVE SEED AND SENTINEL LYMPH NODE BIOPSY Left 12/07/2016   Procedure: LEFT BREAST LUMPECTOMY WITH RADIOACTIVE SEED AND SENTINEL LYMPH NODE BIOPSY;  Surgeon: ThomErroll Luna;  [facility];  Laterality: Left;  . IR  FLUORO GUIDE PORT INSERTION RIGHT  01/04/2017  . IR US G[place]DE VASC ACCESS RIGHT  01/04/2017  . PORT-A-CATH REMOVAL N/A 05/30/2017   Procedure: REMOVAL PORT-A-CATH;  Surgeon: CornErroll Luna;  [facility];  Laterality: N/A;  . RE-EXCISION OF BREAST LUMPECTOMY Left 12/28/2016   Procedure: RE-EXCISION OF BREAST LUMPECTOMY;  Surgeon: ThomErroll Luna;  [facility] [place];  Laterality: Left;    I have reviewed the social history and family history with the patient and they are unchanged from previous note.  ALLERGIES:  has No Known Allergies.  MEDICATIONS:  Current Outpatient Medications  Medication Sig Dispense Refill  . albuterol (VENTOLIN HFA) 108 (90 Base) MCG/ACT inhaler Inhale 2 puffs into the lungs every 6 (six) hours as needed for  wheezing or shortness of breath. 18 g 2  . amLODipine (NORVASC) 10 MG tablet TAKE 1 TABLET BY MOUTH EVERY DAY 90 tablet 1  . B-D INS SYRINGE 0.5CC/31GX5/16 31G X 5/16" 0.5 ML MISC USE AS DIRECTED 4 TIMES A DAY 300 each 1  . busPIRone (BUSPAR) 10 MG tablet Take 1 tablet (10 mg total) by mouth 3 (three) times daily. 270 tablet 1  . carvedilol (COREG) 25 MG tablet Take 1 tablet (25 mg total) by mouth 2 (two) times daily with a meal. 180 tablet 3  . cloNIDine (CATAPRES) 0.3 MG tablet TAKE 1 TABLET (0.3 MG TOTAL) BY MOUTH 2 (TWO) TIMES DAILY. 180 tablet 1  . fluticasone (FLONASE) 50 MCG/ACT nasal spray Place 2 sprays into both nostrils daily. 16 g 3  . fluticasone furoate-vilanterol (BREO ELLIPTA) 200-25 MCG/INH AEPB Inhale 1 puff into the lungs daily. 60 each 3  . fluticasone furoate-vilanterol (BREO ELLIPTA) 200-25 MCG/INH AEPB Inhale 1 puff into the lungs daily. 2 each 0  . Insulin Glargine (LANTUS SOLOSTAR) 100 UNIT/ML Solostar Pen Inject 28 units into the skin in am (Patient taking differently: 26 Units. Inject 28 units into the skin in am) 10 pen 3  . Insulin Pen Needle 31G X 5 MM MISC Use pen needles  for insulin injection daily 100 each 3  . insulin regular (NOVOLIN R) 100 units/mL injection Inject 0.14-0.18 mLs (14-18 Units total) into the skin 3 (three) times daily before meals. Relion. Please provide insulin syringes needed (Patient taking differently: Inject 10-16 Units into the skin 3 (three) times daily before meals. Relion. Please provide insulin syringes needed) 20 mL 11  . Insulin Syringe-Needle U-100 (B-D INS SYRINGE 0.5CC/30GX1/2") 30G X 1/2" 0.5 ML MISC USE AS DIRECTED 4 TIMES A DAY 300 each 3  . lidocaine-prilocaine (EMLA) cream Apply to affected area once 30 g 3  . lisinopril-hydrochlorothiazide (ZESTORETIC) 20-12.5 MG tablet Take 1 tablet by mouth daily. 90 tablet 3  . lovastatin (MEVACOR) 40 MG tablet TAKE 1 TABLET BY MOUTH EVERYDAY AT BEDTIME 90 tablet 3  . mirtazapine (REMERON) 7.5 MG tablet Take 1 tablet (7.5 mg total) by mouth at bedtime. 30 tablet 0  . ondansetron (ZOFRAN) 8 MG tablet Take 1 tablet (8 mg total) by mouth 2 (two) times daily as needed (Nausea or vomiting). 30 tablet 1  . OVER THE COUNTER MEDICATION Take 1 tablet by mouth daily as needed. Stool softner for constipation, also drinks warm prune juice prn    . polyethylene glycol powder (GLYCOLAX/MIRALAX) 17 GM/SCOOP powder Take 17 g by mouth daily. 1 g 0  . spironolactone (ALDACTONE) 25 MG tablet Take 0.5 tablets (12.5 mg total) by mouth daily. 15 tablet 3  . traMADol (ULTRAM) 50 MG tablet TAKE 1 TABLET BY MOUTH EVERY 6 HOURS AS NEEDED FOR MODERATE/SEVERE PAIN (NECK AND SHOULDER PAIN). 20 tablet 0  . aspirin 81 MG tablet Take 81 mg by mouth daily.      Marland Kitchen HYDROcodone-acetaminophen (NORCO/VICODIN) 5-325 MG tablet Take 1 tablet by mouth every 8 (eight) hours as needed for moderate pain. 15 tablet 0   No current facility-administered medications for this visit.    PHYSICAL EXAMINATION: ECOG PERFORMANCE STATUS: 2 - Symptomatic, <50% confined to bed  Vitals:   01/26/20 1304 01/26/20 1329  BP: (!) 146/58   Pulse:  92 (!) 108  Resp: 17   Temp: 97.8 F (36.6 C)   SpO2: 99% 94%   Filed Weights   01/26/20 1304  Weight: 193 lb  3.2 oz (87.6 kg)    GENERAL:alert, no distress and comfortable SKIN: no rash  EYES: sclera clear NECK: without mass LYMPH: left arm lymphedema  LUNGS: clear with normal breathing effort HEART: regular rate & rhythm, no lower extremity edema NEURO: alert & oriented x 3 with fluent speech, mild weakness   LABORATORY DATA:  I have reviewed the data as listed CBC Latest Ref Rng & Units 01/26/2020 01/20/2020 12/11/2019  WBC 4.0 - 10.5 K/uL 3.4(L) 3.2(L) 5.4  Hemoglobin 12.0 - 15.0 g/dL 9.7(L) 9.2(L) 10.7(L)  Hematocrit 36.0 - 46.0 % 28.1(L) 26.5(L) 31.8(L)  Platelets 150 - 400 K/uL 271 226 277     CMP Latest Ref Rng & Units 01/26/2020 01/20/2020 12/11/2019  Glucose 70 - 99 mg/dL 250(H) 184(H) 143(H)  BUN 8 - 23 mg/dL 24(H) 25(H) 38(H)  Creatinine 0.44 - 1.00 mg/dL 2.08(H) 2.11(H) 2.63(H)  Sodium 135 - 145 mmol/L 144 139 135  Potassium 3.5 - 5.1 mmol/L 4.0 3.9 5.5(H)  Chloride 98 - 111 mmol/L 108 106 102  CO2 22 - 32 mmol/L 25 24 25   Calcium 8.9 - 10.3 mg/dL 9.7 9.5 9.2  Total Protein 6.5 - 8.1 g/dL 8.3(H) 7.8 7.2  Total Bilirubin 0.3 - 1.2 mg/dL 0.4 0.3 0.4  Alkaline Phos 38 - 126 U/L 98 89 109  AST 15 - 41 U/L 16 11(L) 9(L)  ALT 0 - 44 U/L 6 7 <6      RADIOGRAPHIC STUDIES: I have personally reviewed the radiological images as listed and agreed with the findings in the report. DG Chest 2 View  Result Date: 01/26/2020 CLINICAL DATA:  Dyspnea. History of metastatic breast cancer. EXAM: CHEST - 2 VIEW COMPARISON:  September 15, 2019. FINDINGS: The heart size and mediastinal contours are within normal limits. No pneumothorax is noted. Small bilateral pleural effusions are noted with associated bibasilar subsegmental atelectasis. The visualized skeletal structures are unremarkable. IMPRESSION: Small bilateral pleural effusions with associated bibasilar subsegmental  atelectasis. Electronically Signed   By: Marijo Conception M.D.   On: 01/26/2020 13:58     ASSESSMENT & PLAN: 79 yo female with   1. Dyspnea  -she has lung metastasis, mild exertional dyspnea at baseline that progressed over the last few days, now severe causing difficulty with ADLs -mild tachycardia in clinic, pulse ox drops to 94% on RA during ambulation -anemia is stable, hgb 9.7 today, likely not the source of her worsening dyspnea; wbc 3.4 on day 8, overall low suspicion for infection -chest xray today shows small pleural effusions, overall appearance of her chest xray is similar to 09/2019  -D-dimer in clinic >20 and repeated to confirm -due to her metastatic breast cancer, on chemo with progressive dyspnea, and markedly elevated D-dimer, the overall clinical picture is highly concerning for PE -I called the charge nurse and she was transported to ED in stable condition for expedited work up    2. Malignant neoplasm of upper-outer quadrant of left breast, Invasive Ductal Carcinoma, pT2pN1M0, stage IIB, ER+/PR-/HER2-, G3, mammaprint high risk, axillarynodesrecurrence in 06/2019, lung nodules in 10/2019, metastatic to lung, bone, LNs, liver in 01/2020 -She was initiallydiagnosed in 11/2016. She was treated with left lumpectomy and SLN sampling,re-excision surgery, Adjuvant TC for 4 cycles andadjuvantradiation.  -She was on adjuvant Letrozole from 06/2017-08/14/19 -Unfortunately she had local recurrence in left axillaandunderwent Left axillary lymph node dissection by Dr. Brantley Stage on 08/05/19.She had complete resection. -She continued Exemestane and addedoral biological agent Verzenio 188m BID starting3/4/21. -PETscanfrom 4/7/21showed wide spread disease with  soft tissue recurrence in the left axilla, enlarging LNs in chest, new lung pleural and parenchymal disease, metastatic disease of the right liver lobe and signs of bone metastasis. There is also new pleural effusions presumably  malignant and associated with pleural nodularity.Shehad developed fatigue, dyspnea, and anorexia lately, likely from her metastatic disease. -Based on the scan findings,Dr. Burr Medico did not recommend biopsy given the clinical picture.  - FO results showedRET, CDK8, NOTCH1, TP53 mutations.Notargetable mutation for now.  -Due to the rapid disease progression through AI and CDK4/6 inhibitor,and multiple visceral organ involvement, Dr. Burr Medico recommended single agent Abraxane57m/m2 weekly,3 weeks on/1 week off for 3-6 months then repeat PET scan. The goal is palliative, to control her disease, prolong her life, and improve quality of life.  -S/p C1D1 on 01/20/20. Holding day 8 chemo today for dyspnea work up  3. Low appetite, nausea, B/l abdominal pain, Diarrhea/constipation -She has mild intermittent nausea which is controlled with antiemetics, and occasional abdominal pain and occasional diarrhea/constipation.  -She has metamucil and prune juice as needed which helps her bowels. Due to recent loose stool, I encouraged her to use imodium PRN and remain adequately hydrated.  -She can use Glucerna to help her maintain weight.  -Dr. FBurr Medicocalled in Mirtazapine 7.558mon 01/08/20 to help her appetite, which improved organically when she stopped verzenio and exemestane. She has not started appetite stimulant yet. I encouraged her to do so if appetite decreases after starting abraxane.   4. Dry cough and SOB,lung metastasis -her baseline cough and exertional dyspnea progressed lately around the time she had her PET scan, but stable since then -Her PCP referred her to pulmonologist Dr. MaVaughan Brownerho is in the process of adjusting inhalers/respiratory meds  -She has receivedboth herCOVID19 vaccinations. -Her PET from 01/07/20 showed new pulmonary pleural and parenchymal disease and presumably malignant pleural effusions. Overall indications of her breast cancer metastasized to lung. -she has progressive  dyspnea today, work up ongoing   5. Left arm numbness/tinglingandLeft arm lymphedema, secondary to surgery. -secondary to malignant left axillary adenopathy, s/p ALND in 08/2019 -She still has left armand handlymphedema.  -She is currently doing PT 3 times a week and wrapping her arm -PAC has not been placed yet, use right arm for peripheral access  6. HTN, DM, CKD III -F/u with PCPand endocrinologist.  -DM is not well controlled. she is on insulin. Patient states a provider told her to hold metformin for now.  -lisinopril-HCTZ on hold until renal function improves.   7. Mild anemia -Iron and TIBC, B12, MMA normal -mildand stable.  8. Osteopenia  -09/2018 DEXA shows osteopeniawith lowest T-score -1.1 -She recently stopped AI due to disease progression -Next DEXA in 09/2020  8. Financialand SocialSupport  -She paid $165 out of pocket for her exemestane, which she stopped in 01/2020 due to disease progression -Can contact financial advocate for copay assistance grant as needed -She gets help with her son but he is looking to return to working.  -Sw assistance as needed   9. Goal of care discussion  -we reviewed that due to her rapid disease progression, her cancer is not curable with chemo alone at this point. The goal is palliative, to prolong her life and improve quality of life. She understands and agrees.  -The patient understands the goal of care is palliative. -no code status on file. Dr. FeBurr Medicoas recommended DNR/DNI previously. Patient is thinking about it  PLAN: -Labs reviewed  -Hold C1D8 Abraxane today for dyspnea work up, r/o  PE -Transported to ED Rm 18   No problem-specific Assessment & Plan notes found for this encounter.   Orders Placed This Encounter  Procedures  . DG Chest 2 View    Standing Status:   Future    Number of Occurrences:   1    Standing Expiration Date:   01/25/2021    Order Specific Question:   Reason for Exam (SYMPTOM  OR  DIAGNOSIS REQUIRED)    Answer:   worsening dyspnea, metastatic breast cancer to lung, r/o pleural effusions    Order Specific Question:   Preferred imaging location?    Answer:   [facility]    Order Specific Question:   Radiology Contrast Protocol - do NOT remove file path    Answer:   [email or web address]  . D-dimer, quantitative    Standing Status:   Future    Number of Occurrences:   1    Standing Expiration Date:   01/25/2021   All questions were answered. The patient knows to call the clinic with any problems, questions or concerns. No barriers to learning was detected. Total encounter time was 45 minutes.     Alla Feeling, NP 01/26/20

## 2020-01-26 ENCOUNTER — Other Ambulatory Visit: Payer: Self-pay

## 2020-01-26 ENCOUNTER — Emergency Department (HOSPITAL_COMMUNITY): Payer: Medicare Other

## 2020-01-26 ENCOUNTER — Emergency Department (HOSPITAL_COMMUNITY)
Admission: EM | Admit: 2020-01-26 | Discharge: 2020-01-26 | Disposition: A | Discharge status: Home / Self Care | Payer: Medicare Other | Attending: Emergency Medicine | Admitting: Emergency Medicine

## 2020-01-26 ENCOUNTER — Inpatient Hospital Stay (HOSPITAL_BASED_OUTPATIENT_CLINIC_OR_DEPARTMENT_OTHER): Payer: Medicare Other | Admitting: Nurse Practitioner

## 2020-01-26 ENCOUNTER — Inpatient Hospital Stay: Payer: Medicare Other

## 2020-01-26 ENCOUNTER — Ambulatory Visit (HOSPITAL_COMMUNITY)
Admission: RE | Admit: 2020-01-26 | Discharge: 2020-01-26 | Disposition: A | Discharge status: Home / Self Care | Payer: Medicare Other | Source: Ambulatory Visit | Attending: Nurse Practitioner | Admitting: Nurse Practitioner

## 2020-01-26 ENCOUNTER — Encounter: Payer: Self-pay | Admitting: Nurse Practitioner

## 2020-01-26 ENCOUNTER — Encounter (HOSPITAL_COMMUNITY): Payer: Self-pay

## 2020-01-26 VITALS — BP 146/58 | HR 108 | Temp 97.8°F | Resp 17 | Ht 62.0 in | Wt 193.2 lb

## 2020-01-26 DIAGNOSIS — R0602 Shortness of breath: Secondary | ICD-10-CM | POA: Insufficient documentation

## 2020-01-26 DIAGNOSIS — Z794 Long term (current) use of insulin: Secondary | ICD-10-CM | POA: Insufficient documentation

## 2020-01-26 DIAGNOSIS — N183 Chronic kidney disease, stage 3 unspecified: Secondary | ICD-10-CM | POA: Insufficient documentation

## 2020-01-26 DIAGNOSIS — I129 Hypertensive chronic kidney disease with stage 1 through stage 4 chronic kidney disease, or unspecified chronic kidney disease: Secondary | ICD-10-CM | POA: Diagnosis not present

## 2020-01-26 DIAGNOSIS — C50412 Malignant neoplasm of upper-outer quadrant of left female breast: Secondary | ICD-10-CM | POA: Diagnosis not present

## 2020-01-26 DIAGNOSIS — Z7982 Long term (current) use of aspirin: Secondary | ICD-10-CM | POA: Insufficient documentation

## 2020-01-26 DIAGNOSIS — E1122 Type 2 diabetes mellitus with diabetic chronic kidney disease: Secondary | ICD-10-CM | POA: Diagnosis not present

## 2020-01-26 DIAGNOSIS — Z170 Estrogen receptor positive status [ER+]: Secondary | ICD-10-CM | POA: Diagnosis not present

## 2020-01-26 DIAGNOSIS — J90 Pleural effusion, not elsewhere classified: Secondary | ICD-10-CM | POA: Diagnosis not present

## 2020-01-26 DIAGNOSIS — Z20822 Contact with and (suspected) exposure to covid-19: Secondary | ICD-10-CM | POA: Insufficient documentation

## 2020-01-26 LAB — COMPREHENSIVE METABOLIC PANEL
ALT: 6 U/L (ref 0–44)
AST: 16 U/L (ref 15–41)
Albumin: 4.1 g/dL (ref 3.5–5.0)
Alkaline Phosphatase: 98 U/L (ref 38–126)
Anion gap: 11 (ref 5–15)
BUN: 24 mg/dL — ABNORMAL HIGH (ref 8–23)
CO2: 25 mmol/L (ref 22–32)
Calcium: 9.7 mg/dL (ref 8.9–10.3)
Chloride: 108 mmol/L (ref 98–111)
Creatinine, Ser: 2.08 mg/dL — ABNORMAL HIGH (ref 0.44–1.00)
GFR calc Af Amer: 26 mL/min — ABNORMAL LOW (ref >60)
GFR calc non Af Amer: 22 mL/min — ABNORMAL LOW (ref >60)
Glucose, Bld: 250 mg/dL — ABNORMAL HIGH (ref 70–99)
Potassium: 4 mmol/L (ref 3.5–5.1)
Sodium: 144 mmol/L (ref 135–145)
Total Bilirubin: 0.4 mg/dL (ref 0.3–1.2)
Total Protein: 8.3 g/dL — ABNORMAL HIGH (ref 6.5–8.1)

## 2020-01-26 LAB — CBC WITH DIFFERENTIAL/PLATELET
Abs Immature Granulocytes: 0.03 10*3/uL (ref 0.00–0.07)
Basophils Absolute: 0 10*3/uL (ref 0.0–0.1)
Basophils Relative: 1 %
Eosinophils Absolute: 0 10*3/uL (ref 0.0–0.5)
Eosinophils Relative: 1 %
HCT: 28.1 % — ABNORMAL LOW (ref 36.0–46.0)
Hemoglobin: 9.7 g/dL — ABNORMAL LOW (ref 12.0–15.0)
Immature Granulocytes: 1 %
Lymphocytes Relative: 24 %
Lymphs Abs: 0.8 10*3/uL (ref 0.7–4.0)
MCH: 29.3 pg (ref 26.0–34.0)
MCHC: 34.5 g/dL (ref 30.0–36.0)
MCV: 84.9 fL (ref 80.0–100.0)
Monocytes Absolute: 0.1 10*3/uL (ref 0.1–1.0)
Monocytes Relative: 3 %
Neutro Abs: 2.4 10*3/uL (ref 1.7–7.7)
Neutrophils Relative %: 70 %
Platelets: 271 10*3/uL (ref 150–400)
RBC: 3.31 MIL/uL — ABNORMAL LOW (ref 3.87–5.11)
RDW: 23.5 % — ABNORMAL HIGH (ref 11.5–15.5)
WBC: 3.4 10*3/uL — ABNORMAL LOW (ref 4.0–10.5)
nRBC: 0 % (ref 0.0–0.2)

## 2020-01-26 LAB — RESPIRATORY PANEL BY RT PCR (FLU A&B, COVID)
Influenza A by PCR: NEGATIVE
Influenza B by PCR: NEGATIVE
SARS Coronavirus 2 by RT PCR: NEGATIVE

## 2020-01-26 LAB — D-DIMER, QUANTITATIVE: D-Dimer, Quant: >20 ug/mL-FEU — ABNORMAL HIGH (ref 0.00–0.50)

## 2020-01-26 MED ORDER — TECHNETIUM TO 99M ALBUMIN AGGREGATED
1.6000 | Freq: Once | INTRAVENOUS | Status: AC | PRN
  Administered 2020-01-26: 1.6 via INTRAVENOUS

## 2020-01-26 NOTE — ED Notes (Signed)
Pt back in room.

## 2020-01-26 NOTE — Discharge Instructions (Signed)
Follow up with your oncologist. Your scan today does not show a PE.

## 2020-01-26 NOTE — ED Triage Notes (Addendum)
Patient brought in from [facility].   Patient reports shob that started over the weekend per patient.   Sent over for elevated d-dimer and dyspnea with exertion per cancer center.   Cancer center states patient 02 drops to 94% while ambulating.    A/Ox4   Patient brought to cancer center by son.   Please see labs for d-dimer result.

## 2020-01-26 NOTE — ED Provider Notes (Signed)
  Face-to-face evaluation   History: She presents from her oncology visit today, for evaluation of dyspnea on exertion.  She has chronic left arm swelling.  D-dimer was done which was elevated and there was clinical concern for PE so she was sent here.  Physical exam: Alert overweight female who is not dyspneic and oxygen saturation is 100% on room air.  No dysarthria or aphasia.  No respiratory distress.  Abdomen is distended but nontender.  Moderate left arm swelling, diffuse.  No lower extremity swelling.    Date: 01/26/20  Rate: 87  Rhythm: sinus arrhythmia  QRS Axis: normal  PR and QT Intervals: normal  ST/T Wave abnormalities: normal  PR and QRS Conduction Disutrbances:none  Narrative Interpretation: PVC     Medical screening examination/treatment/procedure(s) were conducted as a shared visit with non-physician practitioner(s) and myself.  I personally evaluated the patient during the encounter    Daleen Bo, MD 01/27/20 [phone]

## 2020-01-26 NOTE — ED Provider Notes (Signed)
[facility] DEPT Provider Note   CSN: [id number] Arrival date & time: 01/26/20  1607     History Chief Complaint  Patient presents with  . Shortness of Breath  . CA PT    Wendy [name] is a 79 y.o. female.  79yo female with complaint of shortness of breath, sent by oncology for elevated d-dimer today. Patient states she has been short of breath for the past year, initially managed with inhalers. SHOB worse for the past month and more so for the past few days, occurs while she is standing trying to cook or walking very short distances. 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 Work up in clinic today, CXR with small pleural effusions, d-dimer 20, Cr 2.02, CBC with normal WBC, not profoundly anemic compared to baseline. Patient denies chest pain, history of CHF, lower extremity edema, fevers, chills, sweats. States she does have wheezing at times, does not feel like her inhalers help her.        Past Medical History:  Diagnosis Date  . Abnormal breast finding 2018   per pt/ having  a lot drainage from left breast nipple  . Anemia   . Anxiety   . Breast cancer ([place]) 11/2016   left/  . CKD (chronic kidney disease)   . Depression   . GERD (gastroesophageal reflux disease)    TUMS as needed  . HTN (hypertension)    states BP has been high recently; has been on med. x 20 yr.  . Hyperlipidemia   . Hyperplastic colon polyp   . HYPERTENSION, BENIGN SYSTEMIC 11/29/2006   Lisinopril hctz 10-12.5mg , amlodipine 10mg , coreg 25mg  BID, clonidine 0.3 mg BID.   Marland Kitchen Insulin dependent diabetes mellitus   . Personal history of chemotherapy    2018/finished 6 weeks of chemo in Sep 2018  . Personal history of radiation therapy    2018 left breast/finished radiation in Sept 2018 per pt.    Patient Active Problem List   Diagnosis Date Noted  . Goals of care,  counseling/discussion 01/08/2020  . Lymphedema of left arm 09/03/2019  . 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 Grief 05/10/2018  . Anemia 01/09/2018  . Fatigue 03/14/2017  . Edema 03/14/2017  . Shortness of breath 03/14/2017  . Port catheter in place 01/18/2017  . Rectal bleeding 01/17/2017  . Constipation 01/17/2017  . Malignant neoplasm of upper-outer quadrant of left breast in female, estrogen receptor positive ([place]) 11/20/2016  . CKD (chronic kidney disease) stage 3, GFR 30-59 ml/min ([facility]) 03/17/2016  . 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 Seasonal allergies 12/01/2013  . Diastolic dysfunction 16/60/6301  . TOBACCO USE 04/13/2008  . Hyperlipidemia associated with type 2 diabetes mellitus ([place]) 11/29/2006  . OBESITY, BMI 30-35 11/29/2006  . Hypertension associated with diabetes ([place]) 11/29/2006    Past Surgical History:  Procedure Laterality Date  . AXILLARY LYMPH NODE DISSECTION Left 08/05/2019   Procedure: LEFT AXILLARY LYMPH NODE DISSECTION;  Surgeon: Erroll Luna, MD;  Location: [facility];  Service: General;  Laterality: Left;  . BREAST LUMPECTOMY Left 05/05/2008  . BREAST LUMPECTOMY Left 12/07/2016   malignant  . BREAST LUMPECTOMY WITH RADIOACTIVE SEED AND SENTINEL LYMPH NODE BIOPSY Left 12/07/2016   Procedure: LEFT BREAST LUMPECTOMY WITH RADIOACTIVE SEED AND SENTINEL LYMPH NODE  BIOPSY;  Surgeon: Erroll Luna, MD;  Location: [facility];  Service: General;  Laterality: Left;  . IR FLUORO GUIDE PORT INSERTION RIGHT  01/04/2017  . IR US GUIDE VASC ACCESS RIGHT  01/04/2017  . PORT-A-CATH REMOVAL N/A 05/30/2017   Procedure: REMOVAL PORT-A-CATH;  Surgeon: Erroll Luna, MD;  Location: [facility];  Service: General;  Laterality: N/A;  . RE-EXCISION OF BREAST LUMPECTOMY Left 12/28/2016   Procedure:  RE-EXCISION OF BREAST LUMPECTOMY;  Surgeon: Erroll Luna, MD;  Location: [facility];  Service: General;  Laterality: Left;     OB History   No obstetric history on file.     Family History  Problem Relation Age of Onset  . Diabetes Sister        x 4  . Heart failure Sister   . Diabetes Brother   . Heart disease Brother   . Diabetes Paternal Grandmother   . Diabetes Paternal Aunt   . Heart attack Brother   . Arthritis Sister   . Hypertension Sister   . Diabetes Sister   . Emphysema Brother   . Cancer Father 72       brain tumor   . Diabetes Child   . Asthma Child   . Hypertension Child   . Diabetes Child   . Diabetes Child   . Colon cancer Neg Hx   . Stomach cancer Neg Hx     Social History   Tobacco Use  . Smoking status: Never Smoker  . Smokeless tobacco: Former Systems developer    Types: Snuff  Substance Use Topics  . Alcohol use: No    Alcohol/week: 0.0 standard drinks  . Drug use: No    Home Medications Prior to Admission medications   Medication Sig Start Date End Date Taking? Authorizing Provider  albuterol (VENTOLIN HFA) 108 (90 Base) MCG/ACT inhaler Inhale 2 puffs into the lungs every 6 (six) hours as needed for wheezing or shortness of breath. 10/21/19  Yes Marin Olp, MD  amLODipine (NORVASC) 10 MG tablet TAKE 1 TABLET BY MOUTH EVERY DAY Patient taking differently: Take 10 mg by mouth daily.  09/22/19  Yes Marin Olp, MD  aspirin 81 MG tablet Take 81 mg by mouth daily.     Yes [provider]  B-D INS SYRINGE 0.5CC/31GX5/16 31G X 5/16" 0.5 ML MISC USE AS DIRECTED 4 TIMES A DAY 08/18/16  Yes Philemon Kingdom, MD  busPIRone (BUSPAR) 10 MG tablet Take 1 tablet (10 mg total) by mouth 3 (three) times daily. 10/16/19  Yes Marin Olp, MD  carvedilol (COREG) 25 MG tablet Take 1 tablet (25 mg total) by mouth 2 (two) times daily with a meal. 10/07/19  Yes Marin Olp, MD  cloNIDine (CATAPRES) 0.3 MG tablet TAKE 1 TABLET (0.3 MG TOTAL) BY MOUTH  2 (TWO) TIMES DAILY. 08/18/19  Yes Marin Olp, MD  fluticasone [facility]) 50 MCG/ACT nasal spray Place 2 sprays into both nostrils daily. 10/21/19  Yes Marin Olp, MD  fluticasone furoate-vilanterol (BREO ELLIPTA) 200-25 MCG/INH AEPB Inhale 1 puff into the lungs daily. 10/30/19  Yes Mannam, Praveen, MD  Insulin Glargine (LANTUS SOLOSTAR) 100 UNIT/ML Solostar Pen Inject 28 units into the skin in am Patient taking differently: Inject 28 Units into the skin every morning.  08/11/19  Yes Philemon Kingdom, MD  Insulin Pen Needle 31G X 5 MM MISC Use pen needles for insulin injection daily 12/12/16  Yes Philemon Kingdom, MD  insulin regular (NOVOLIN R)  100 units/mL injection Inject 0.14-0.18 mLs (14-18 Units total) into the skin 3 (three) times daily before meals. Relion. Please provide insulin syringes needed Patient taking differently: Inject 10-16 Units into the skin See admin instructions. 16 units in the morning and 10 units at night 08/11/19  Yes Philemon Kingdom, MD  Insulin Syringe-Needle U-100 (B-D INS SYRINGE 0.5CC/30GX1/2") 30G X 1/2" 0.5 ML MISC USE AS DIRECTED 4 TIMES A DAY 08/15/16  Yes Philemon Kingdom, MD  lidocaine-prilocaine (EMLA) cream Apply to affected area once 01/08/20  Yes [name], MD  lisinopril-hydrochlorothiazide (ZESTORETIC) 20-12.5 MG tablet Take 1 tablet by mouth daily. 09/16/19  Yes Marin Olp, MD  lovastatin (MEVACOR) 40 MG tablet TAKE 1 TABLET BY MOUTH EVERYDAY AT BEDTIME Patient taking differently: Take 40 mg by mouth at bedtime.  09/22/19  Yes Marin Olp, MD  ondansetron (ZOFRAN) 8 MG tablet Take 1 tablet (8 mg total) by mouth 2 (two) times daily as needed (Nausea or vomiting). 01/08/20  Yes [name], MD  OVER THE COUNTER MEDICATION Take 1 tablet by mouth daily as needed. Stool softner for constipation, also drinks warm prune juice prn   Yes [provider]  polyethylene glycol powder (GLYCOLAX/MIRALAX) 17 GM/SCOOP powder Take 17 g by mouth  daily. Patient taking differently: Take 17 g by mouth as needed for mild constipation or moderate constipation.  05/22/19  Yes Pyrtle, Lajuan Lines, MD  spironolactone (ALDACTONE) 25 MG tablet Take 0.5 tablets (12.5 mg total) by mouth daily. 10/16/19  Yes Marin Olp, MD  traMADol (ULTRAM) 50 MG tablet TAKE 1 TABLET BY MOUTH EVERY 6 HOURS AS NEEDED FOR MODERATE/SEVERE PAIN (NECK AND SHOULDER PAIN). Patient taking differently: Take 50 mg by mouth every 6 (six) hours as needed for moderate pain or severe pain (neck and shoulder pain).  01/14/20  Yes Marin Olp, MD  fluticasone furoate-vilanterol (BREO ELLIPTA) 200-25 MCG/INH AEPB Inhale 1 puff into the lungs daily. Patient not taking: Reported on 01/26/2020 11/13/19   Marshell Garfinkel, MD  HYDROcodone-acetaminophen (NORCO/VICODIN) 5-325 MG tablet Take 1 tablet by mouth every 8 (eight) hours as needed for moderate pain. Patient not taking: Reported on 01/26/2020 08/20/19   [name], MD  mirtazapine (REMERON) 7.5 MG tablet Take 1 tablet (7.5 mg total) by mouth at bedtime. Patient not taking: Reported on 01/26/2020 01/08/20   [name], MD    Allergies    Patient has no known allergies.  Review of Systems   Review of Systems  Constitutional: Negative for fever.  Respiratory: Positive for shortness of breath. Negative for cough.   Cardiovascular: Negative for chest pain.  Gastrointestinal: Negative for abdominal pain.  Skin: Negative for rash and wound.  Allergic/Immunologic: Positive for immunocompromised state.  Neurological: Negative for weakness.  All other systems reviewed and are negative.   Physical Exam Updated Vital Signs BP 140/64   Pulse 86   Temp 97.7 F (36.5 C) (Oral)   Resp 18   Ht 5\' 2"  (1.575 m)   Wt 89.4 kg   LMP  (LMP Unknown)   SpO2 100%   BMI 36.03 kg/m   Physical Exam Vitals and nursing note reviewed.  Constitutional:      General: She is not in acute distress.    Appearance: She is well-developed. She is  not diaphoretic.  HENT:     Head: Normocephalic and atraumatic.  Cardiovascular:     Rate and Rhythm: Normal rate and regular rhythm.  Pulmonary:     Effort: Pulmonary  effort is normal.     Breath sounds: Normal breath sounds. No decreased breath sounds.  Chest:     Chest wall: No tenderness.  Abdominal:     Palpations: Abdomen is soft.     Tenderness: There is no abdominal tenderness.  Musculoskeletal:     Right lower leg: No edema.     Left lower leg: No edema.  Skin:    General: Skin is warm and dry.     Findings: No erythema.  Neurological:     Mental Status: She is alert and oriented to person, place, and time.  Psychiatric:        Behavior: Behavior normal.     ED Results / Procedures / Treatments   Labs (all labs ordered are listed, but only abnormal results are displayed) Labs Reviewed  RESPIRATORY PANEL BY RT PCR (FLU A&B, COVID)    EKG None  Radiology DG Chest 2 View  Result Date: 01/26/2020 CLINICAL DATA:  Dyspnea. History of metastatic breast cancer. EXAM: CHEST - 2 VIEW COMPARISON:  September 15, 2019. FINDINGS: The heart size and mediastinal contours are within normal limits. No pneumothorax is noted. Small bilateral pleural effusions are noted with associated bibasilar subsegmental atelectasis. The visualized skeletal structures are unremarkable. IMPRESSION: Small bilateral pleural effusions with associated bibasilar subsegmental atelectasis. Electronically Signed   By: Marijo Conception M.D.   On: 01/26/2020 13:58   NM PULMONARY VENT AND PERF (V/Q Scan)  Result Date: 01/26/2020 CLINICAL DATA:  Shortness of breath EXAM: NUCLEAR MEDICINE VENTILATION - PERFUSION LUNG SCAN TECHNIQUE: Ventilation images were obtained in multiple projections using inhaled aerosol Tc-47m DTPA. Perfusion images were obtained in multiple projections after intravenous injection of Tc-39m MAA. RADIOPHARMACEUTICALS:  1.6 mCi Tc27m MAA IV, was unable to complete the ventilation portion  COMPARISON:  Radiograph same day FINDINGS: Perfusion: No wedge shaped peripheral perfusion defects to suggest acute pulmonary embolism. Blunting of the bilateral costophrenic angles, consistent with the patient's known small bilateral pleural effusions. IMPRESSION: No large wedge-shaped perfusion defects. Electronically Signed   By: Prudencio Pair M.D.   On: 01/26/2020 22:50    Procedures Procedures (including critical care time)  Medications Ordered in ED Medications  technetium albumin aggregated (MAA) injection solution 1.6 millicurie (1.6 millicuries Intravenous Contrast Given 01/26/20 2215)    ED Course  I have reviewed the triage vital signs and the nursing notes.  Pertinent labs & imaging results that were available during my care of the patient were reviewed by me and considered in my medical decision making (see chart for details).  Clinical Course as of Jan 26 2304  Mon Jan 26, 2020  1735 [phone] call when COVID test results for VQ scan   [LM]  2012 VQ tech paged, COVID test negative.   [LM]  413 79 year old female sent by oncology clinic for concern for PE.  Patient reports worsening shortness of breath, clinic saw patient today, checked CBC, chemistry and D-dimer.  Patient was found to have an elevated D-dimer at 20, chronic anemia without significant change from baseline to explain her shortness of breath and creatinine greater than 2.  Due to creatinine greater than 2, patient had a VQ scan today which does not show a PE.  Recommend patient follow-up with her oncologist.   [LM]    Clinical Course User Index [LM] Roque Lias   MDM Rules/Calculators/A&P  Final Clinical Impression(s) / ED Diagnoses Final diagnoses:  Shortness of breath    Rx / DC Orders ED Discharge Orders    None       Roque Lias 01/26/20 2306    Daleen Bo, MD 01/27/20 [phone]

## 2020-01-27 ENCOUNTER — Telehealth: Payer: Self-pay | Admitting: Nurse Practitioner

## 2020-01-27 NOTE — Telephone Encounter (Signed)
I called Ms. Conyer to f/u on her ED evaluation on 01/26/20 that was negative for PE. COVID 19 test also negative. Her exertional dyspnea is possibly related to treatment-related fatigue. She did not answer the phone when I called, but I spoke to her daughter [name] who thought she is sleeping. I will send schedule message to bring patient in for f/u and possible treatment later this week. Her daughter appreciated the call.   Cira Rue, NP

## 2020-01-27 NOTE — Progress Notes (Signed)
Pharmacist Chemotherapy Monitoring - Follow Up Assessment    I verify that I have reviewed each item in the below checklist:  . Regimen for the patient is scheduled for the appropriate day and plan matches scheduled date. Marland Kitchen Appropriate non-routine labs are ordered dependent on drug ordered. . If applicable, additional medications reviewed and ordered per protocol based on lifetime cumulative doses and/or treatment regimen.   Plan for follow-up and/or issues identified: Yes . I-vent associated with next due treatment: Yes . MD and/or nursing notified: Hoy Register 01/27/2020 3:38 PM

## 2020-01-28 NOTE — Progress Notes (Addendum)
[place]   [phone] [phone]   Clinic Follow up Note   Patient Care Team: Marin Olp, MD as PCP - General (Family Medicine) Philemon Kingdom, MD as Consulting Physician (Internal Medicine) Jalene Mullet, MD as Consulting Physician (Ophthalmology) Erroll Luna, MD as Consulting Physician (General Surgery) Kyung Rudd, MD as Consulting Physician (Radiation Oncology) [name], MD as Consulting Physician (Hematology) Gardenia Phlegm, NP as Nurse Practitioner (Hematology and Oncology) 01/29/2020  CHIEF COMPLAINT: F/u metastatic breast cancer   SUMMARY OF ONCOLOGIC HISTORY: Oncology History Overview Note  Cancer Staging Malignant neoplasm of upper-outer quadrant of left breast in female, estrogen receptor positive ([place]) Staging form: Breast, AJCC 8th Edition - Clinical: Stage IIB (cT2, cN0, cM0, G3, ER: Positive, PR: Negative, HER2: Negative) - Signed by [name], MD on 11/23/2016 - Pathologic stage from 12/28/2016: Stage IIB (pT2, pN1a(sn), cM0, G3, ER: Positive, PR: Negative, HER2: Negative) - Signed by [name], MD on 01/02/2017     Malignant neoplasm of upper-outer quadrant of left breast in female, estrogen receptor positive ([place])  11/01/2016 Mammogram   Diagnostic mammogram and ultrasound of left breast and axilla showed a 3.1 x 2.1 x 1.4 cm (3.3 x 2.0 x 2.7 cm by ultrasound) mass in the upper outer quadrant of the anterior third of the left breast, associated with pleomorphic calcification. There is a 8 mm (1.8cm by [name]) prominent lymph node in the left axilla.   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   11/07/2016 Receptors her2   ER 80% positive, PR negative, HER-2 negative, Ki-67 90%   11/20/2016 Initial Diagnosis   Malignant neoplasm of upper-outer quadrant of left breast in female, estrogen receptor positive  ([place])   12/07/2016 Surgery   Left lumpectomy and left axillary sentinel lymph node sampling by Dr. Brantley Stage   12/07/2016 Pathology Results   pT2, pN1 Left Lumpectomy: Grade 3 IDC measuring 3.4 cm, carcinoma broadly present at the superior margin. Grade 3 DCIS. 1 out of 2 left axillary SLN positive for metastatic carcinoma   12/07/2016 Miscellaneous   Mammaprint showed high risk disease, basal type    12/28/2016 Surgery   Re-excision of the previously positive superior margin was negative for malignant cells.    01/04/2017 Surgery   Port inserted   01/18/2017 - 03/22/2017 Chemotherapy   Adjuvant Docetaxel 75 mg/m and Cytoxan 600 mg/m, every 21 days, for total of 4 cycles, with Neulasta on day 2.    04/23/2017 - 05/18/2017 Radiation Therapy   Radiation treatment dates:   04/23/17 - 05/18/17 Administered by Dr. Lisbeth Renshaw  Site/dose:    Left breast/ 42.5 Gy in 17 Fx Boost / 7.5 Gy in 3 Fx   06/12/2017 - 08/14/2019 Anti-estrogen oral therapy   Adjuvant letrozole 1 mg daily, plan for 5-7 years. D/c on 08/14/19 due to local recurrence of left axilla.     07/20/2017 Mammogram   IMPRESSION: No mammographic evidence of malignancy in either breast. 3.8 cm left breast postsurgical loculated seroma.   09/12/2017 Survivorship     09/19/2018 Imaging   Baseline DEXA 09/19/18 ASSESSMENT: The BMD measured at Femur Neck Left is 0.888 g/cm2 with a T-score of -1.1. This patient is considered osteopenic according to [facility]) criteria.   The scan quality is good. L-4 was excluded due to degenerative changes.   Site Region Measured Date Measured Age YA BMD Significant CHANGE  T-score AP Spine  L1-L3      09[age]19/[age]         -0.7    1.084 g/cm2   DualFemur Neck Left  09/19/2018    [age]         -1.1    0.888 g/cm2   DualFemur Total Mean 09/19/2018    [age]         -0.5    0.946 g/cm2 ASSESSMENT: The probability of a major osteoporotic fracture is 4.5 % within the next ten  years.   The probability of hip fracture is 0.8  % within the next 10 years.   06/25/2019 Mammogram   Diagnostic mammogram 06/25/19  IMPRESSION: 1.  Two morphologically abnormal lymph nodes in the left axilla with a lymph node in the lower left axilla measuring 2.4 x 2 x 2.6 cm.   2. Stable lumpectomy changes left breast with no findings of malignancy in either breast.   07/01/2019 Pathology Results   Diagnosis Lymph node, needle/core biopsy, left axilla - POORLY DIFFERENTIATED CARCINOMA CONSISTENT WITH BREAST PRIMARY. - NO LYMPHOID NODAL TISSUE IDENTIFIED. - SEE MICROSCOPIC DESCRIPTION. Microscopic Comment The carcinoma is consistent with grade III.  PROGNOSTIC INDICATORS Results: IMMUNOHISTOCHEMICAL AND MORPHOMETRIC ANALYSIS PERFORMED MANUALLY The tumor cells are NEGATIVE for Her2 (1+). Estrogen Receptor: 80%, POSITIVE, WEAK STAINING INTENSITY Progesterone Receptor: 0%, NEGATIVE Proliferation Marker Ki67: 80%   07/18/2019 Breast MRI   Breast MRI 07/18/19  IMPRESSION: 1. Two morphologically abnormal level 1 left axillary lymph nodes correlating with biopsy-proven malignancy. 2. No MRI evidence of malignancy in either breast. 3. Postsurgical changes of the left breast consistent with prior lumpectomy.   07/25/2019 PET scan   PET 07/25/19  IMPRESSION: 1. Hypermetabolic LEFT axillary mass consistent with metastatic breast cancer. 2. High LEFT axillary/posterior triangle (level 5) lymph node consistent with local metastasis. 3. No central mediastinal nodal metastasis. No suspicious pulmonary nodules. 4. No distant soft tissue metastasis or skeletal metastasis. 5. Calcified fibroid uterus   08/05/2019 Surgery   LEFT AXILLARY LYMPH NODE DISSECTION by Dr. Brantley Stage 08/05/19    08/05/2019 Pathology Results   FINAL MICROSCOPIC DIAGNOSIS: 08/05/19    A. LYMPH NODES, LEFT AXILLARY, DISSECTION:  - Metastatic carcinoma.  - See comment.    08/2019 - 01/13/2020 Anti-estrogen oral  therapy   Exemestane 63m starting 08/2019. Stopped with the start of chemo in 01/2020   09/01/2019 Genetic Testing   [employer] Genomic Findings:  -RET amplification -CDK8 amplification -NOTCH1 deletion  -TQH47splice site 3654Y>T  10/35/4656Imaging   CT Angio Chest   IMPRESSION: 1. No definitive pulmonary embolism. 2. Multiple new small bilateral pulmonary nodules consistent with metastatic disease. 3. Soft tissue density in the left axilla at the site of node dissection, probably representing scarring but I cannot exclude tumor recurrence. 4. New partially healed fracture of the posterior aspect of the right eighth rib.   12/04/2019 - 01/13/2020 Chemotherapy   Verzenio 1541mBID starting 12/04/19. Stopped a week before the start of chemo in 01/2020   01/07/2020 PET scan   IMPRESSION: 1. Marked worsening of disease with soft tissue recurrence in the left axilla, new and enlarging lymph nodes in the supraclavicular/high axillary region, new mediastinal adenopathy and new pulmonary pleural and parenchymal disease. 2. Metastatic disease to the right hepatic lobe. 3. Signs of bony metastasis. 4. New pleural effusions presumably malignant and associated with pleural nodularity.   01/20/2020 -  Chemotherapy   The patient had PACLitaxel-protein bound (ABRAXANE)  chemo infusion 150 mg, 80 mg/m2 = 150 mg, Intravenous,  Once, 1 of 4 cycles Administration: 150 mg (01/20/2020)  for chemotherapy treatment.    01/21/2020 - 01/21/2020 Chemotherapy   The patient had PACLitaxel (TAXOL) 156 mg in sodium chloride 0.9 % 250 mL chemo infusion (</= 65m/m2), 80 mg/m2, Intravenous,  Once, 0 of 4 cycles  for chemotherapy treatment.      CURRENT THERAPY:  Systemic chemotherapy, Abraxane on days 1, 8, 15 q28 days; started 01/20/20   INTERVAL HISTORY: Ms. JJurgensenreturns for f/u as scheduled. She was seen earlier this week 4/26 for C1D8 abraxane, treatment was held for dyspnea evaluation. She had  elevated D-dimer >20 in clinic and was sent to ED. Her V/Q scan was negative for PE and she was discharged home. She returns today for f/u and consideration to resume chemo. Her son is with her.   She remains short of breath with minimal exertion. She has been short of breath for some time now, but worse over the last month. She feels she has something in her throat that she can't get up. Has occasional dry cough which is at baseline. Might be allergies. She feels fine at rest. No fever, chills, chest pain. She continues albuterol inh and breo but is running low. These meds previously helped her dyspnea but not lately.   Otherwise from chemo standpoint, she is doing well. No n/v/c/d. Appetite is stable. She is not significantly fatigued, if not for her dyspnea would be able to do housework and be active at home. She has never felt like this, she is tearful. Denies new pain.    MEDICAL HISTORY:  Past Medical History:  Diagnosis Date  . Abnormal breast finding 2018   per pt/ having  a lot drainage from left breast nipple  . Anemia   . Anxiety   . Breast cancer ([place] 11[place]   left/  . CKD (chronic kidney disease)   . Depression   . GERD (gastroesophageal reflux disease)    TUMS as needed  . HTN (hypertension)    states BP has been high recently; has been on med. x 20 yr.  . Hyperlipidemia   . Hyperplastic colon polyp   . HYPERTENSION, BENIGN SYSTEMIC 11/29/2006   Lisinopril hctz 10-12.581m amlodipine 1019mcoreg 56m18mD, clonidine 0.3 mg BID.   . InMarland Kitchenulin dependent diabetes mellitus   . Personal history of chemotherapy    2018/finished 6 weeks of chemo in Sep 2018  . Personal history of radiation therapy    2018 left breast/finished radiation in Sept 2018 per pt.    SURGICAL HISTORY: Past Surgical History:  Procedure Laterality Date  . AXILLARY LYMPH NODE DISSECTION Left 08/05/2019   Procedure: LEFT AXILLARY LYMPH NODE DISSECTION;  Surgeon: CornErroll Luna;  [facility];  Laterality: Left;  . BREAST LUMPECTOMY Left 05/05/2008  . BREAST LUMPECTOMY Left 12/07/2016   malignant  . BREAST LUMPECTOMY WITH RADIOACTIVE SEED AND SENTINEL LYMPH NODE BIOPSY Left 12/07/2016   Procedure: LEFT BREAST LUMPECTOMY WITH RADIOACTIVE SEED AND SENTINEL LYMPH NODE BIOPSY;  Surgeon: ThomErroll Luna;  Location: [facility] Home[facility]: General;  Laterality: Left;  . IR FLUORO GUIDE PORT INSERTION RIGHT  01/04/2017  . IR US G[place]DE VASC ACCESS RIGHT  01/04/2017  . PORT-A-CATH REMOVAL N/A 05/30/2017   Procedure: REMOVAL PORT-A-CATH;  Surgeon: CornErroll Luna;  [facility]: [facility] General;  Laterality: N/A;  . RE-EXCISION  OF BREAST LUMPECTOMY Left 12/28/2016   Procedure: RE-EXCISION OF BREAST LUMPECTOMY;  Surgeon: Erroll Luna, MD;  Location: [facility];  Service: General;  Laterality: Left;    I have reviewed the social history and family history with the patient and they are unchanged from previous note.  ALLERGIES:  has No Known Allergies.  MEDICATIONS:  Current Outpatient Medications  Medication Sig Dispense Refill  . albuterol (VENTOLIN HFA) 108 (90 Base) MCG/ACT inhaler Inhale 2 puffs into the lungs every 6 (six) hours as needed for wheezing or shortness of breath. 18 g 2  . amLODipine (NORVASC) 10 MG tablet TAKE 1 TABLET BY MOUTH EVERY DAY (Patient taking differently: Take 10 mg by mouth daily. ) 90 tablet 1  . busPIRone (BUSPAR) 10 MG tablet Take 1 tablet (10 mg total) by mouth 3 (three) times daily. 270 tablet 1  . carvedilol (COREG) 25 MG tablet Take 1 tablet (25 mg total) by mouth 2 (two) times daily with a meal. 180 tablet 3  . cloNIDine (CATAPRES) 0.3 MG tablet TAKE 1 TABLET (0.3 MG TOTAL) BY MOUTH 2 (TWO) TIMES DAILY. 180 tablet 1  . fluticasone (FLONASE) 50 MCG/ACT nasal spray Place 2 sprays into both nostrils daily. 16 g 3  . fluticasone furoate-vilanterol (BREO ELLIPTA) 200-25 MCG/INH AEPB Inhale 1  puff into the lungs daily. 60 each 3  . fluticasone furoate-vilanterol (BREO ELLIPTA) 200-25 MCG/INH AEPB Inhale 1 puff into the lungs daily. 2 each 0  . Insulin Glargine (LANTUS SOLOSTAR) 100 UNIT/ML Solostar Pen Inject 28 units into the skin in am (Patient taking differently: Inject 28 Units into the skin every morning. ) 10 pen 3  . Insulin Pen Needle 31G X 5 MM MISC Use pen needles for insulin injection daily 100 each 3  . insulin regular (NOVOLIN R) 100 units/mL injection Inject 0.14-0.18 mLs (14-18 Units total) into the skin 3 (three) times daily before meals. Relion. Please provide insulin syringes needed (Patient taking differently: Inject 10-16 Units into the skin See admin instructions. 16 units in the morning and 10 units at night) 20 mL 11  . Insulin Syringe-Needle U-100 (B-D INS SYRINGE 0.5CC/30GX1/2") 30G X 1/2" 0.5 ML MISC USE AS DIRECTED 4 TIMES A DAY 300 each 3  . lisinopril-hydrochlorothiazide (ZESTORETIC) 20-12.5 MG tablet Take 1 tablet by mouth daily. 90 tablet 3  . lovastatin (MEVACOR) 40 MG tablet TAKE 1 TABLET BY MOUTH EVERYDAY AT BEDTIME (Patient taking differently: Take 40 mg by mouth at bedtime. ) 90 tablet 3  . spironolactone (ALDACTONE) 25 MG tablet Take 0.5 tablets (12.5 mg total) by mouth daily. 15 tablet 3  . traMADol (ULTRAM) 50 MG tablet TAKE 1 TABLET BY MOUTH EVERY 6 HOURS AS NEEDED FOR MODERATE/SEVERE PAIN (NECK AND SHOULDER PAIN). (Patient taking differently: Take 50 mg by mouth every 6 (six) hours as needed for moderate pain or severe pain (neck and shoulder pain). ) 20 tablet 0  . aspirin 81 MG tablet Take 81 mg by mouth daily.      . B-D INS SYRINGE 0.5CC/31GX5/16 31G X 5/16" 0.5 ML MISC USE AS DIRECTED 4 TIMES A DAY 300 each 1  . HYDROcodone-acetaminophen (NORCO/VICODIN) 5-325 MG tablet Take 1 tablet by mouth every 8 (eight) hours as needed for moderate pain. (Patient not taking: Reported on 01/26/2020) 15 tablet 0  . lidocaine-prilocaine (EMLA) cream Apply to  affected area once 30 g 3  . mirtazapine (REMERON) 7.5 MG tablet Take 1 tablet (7.5 mg total) by mouth at bedtime. (Patient  not taking: Reported on 01/26/2020) 30 tablet 0  . ondansetron (ZOFRAN) 8 MG tablet Take 1 tablet (8 mg total) by mouth 2 (two) times daily as needed (Nausea or vomiting). 30 tablet 1  . OVER THE COUNTER MEDICATION Take 1 tablet by mouth daily as needed. Stool softner for constipation, also drinks warm prune juice prn    . polyethylene glycol powder (GLYCOLAX/MIRALAX) 17 GM/SCOOP powder Take 17 g by mouth daily. (Patient taking differently: Take 17 g by mouth as needed for mild constipation or moderate constipation. ) 1 g 0   No current facility-administered medications for this visit.    PHYSICAL EXAMINATION: ECOG PERFORMANCE STATUS: 2 - Symptomatic, <50% confined to bed  Vitals:   01/29/20 0825  BP: (!) 105/45  Pulse: 65  Resp: 18  Temp: 98.3 F (36.8 C)  SpO2: 100%   02 sat 96-100 % on RA during ambulation in clinic, HR 70-80's   Filed Weights   01/29/20 0825  Weight: 196 lb 4.8 oz (89 kg)    GENERAL:alert, no distress and comfortable SKIN: no rash  EYES: sclera clear LYMPH:  Left upper extremity lymphedema  LUNGS: clear with normal breathing effort at rest. No wheezing or abnormal breath sounds. Increased work of breathing on ambulation HEART: regular rate & rhythm, trace lower extremity edema ABDOMEN: abdomen soft, non-tender and normal bowel sounds NEURO: alert & oriented x 3 with fluent speech, normal gait  LABORATORY DATA:  I have reviewed the data as listed CBC Latest Ref Rng & Units 01/29/2020 01/26/2020 01/20/2020  WBC 4.0 - 10.5 K/uL 3.2(L) 3.4(L) 3.2(L)  Hemoglobin 12.0 - 15.0 g/dL 9.0(L) 9.7(L) 9.2(L)  Hematocrit 36.0 - 46.0 % 25.9(L) 28.1(L) 26.5(L)  Platelets 150 - 400 K/uL 315 271 226     CMP Latest Ref Rng & Units 01/29/2020 01/26/2020 01/20/2020  Glucose 70 - 99 mg/dL 103(H) 250(H) 184(H)  BUN 8 - 23 mg/dL 31(H) 24(H) 25(H)    Creatinine 0.44 - 1.00 mg/dL 2.55(H) 2.08(H) 2.11(H)  Sodium 135 - 145 mmol/L 144 144 139  Potassium 3.5 - 5.1 mmol/L 3.7 4.0 3.9  Chloride 98 - 111 mmol/L 110 108 106  CO2 22 - 32 mmol/L 22 25 24   Calcium 8.9 - 10.3 mg/dL 9.1 9.7 9.5  Total Protein 6.5 - 8.1 g/dL 7.5 8.3(H) 7.8  Total Bilirubin 0.3 - 1.2 mg/dL 0.3 0.4 0.3  Alkaline Phos 38 - 126 U/L 91 98 89  AST 15 - 41 U/L 13(L) 16 11(L)  ALT 0 - 44 U/L <6 6 7       RADIOGRAPHIC STUDIES: I have personally reviewed the radiological images as listed and agreed with the findings in the report. No results found.   ASSESSMENT & PLAN: 79 yo female with   1. Dyspnea  -she has lung metastasis and h/o asthma, mild exertional dyspnea at baseline that progressed over the last few days to weeks, now severe causing difficulty with ADLs -on 01/26/20, she was mildly tachycardia in clinic, pulse ox drops to 94% on RA during ambulation -anemia stable in 9 range, likely not the source of her worsening dyspnea  -chest xray 4/26 shows small pleural effusions, no new signs of infection  -D-dimer in clinic >20 on 01/26/20 and repeated to confirm, she was referred to ED due to concern for PE. V/Q scan was negative.  -today she remains dyspneic on minimal exertion, increased WOB around clinic. She is not hypoxic, O2 sat remains 96-100% during ambulation. Lungs clear today. -This could be  related to her pulmonary metastasis, or fatigue-related shortness of breath from chemo, however she does not feel very tired. She otherwise tolerated chemo well last week.  -I recommend to see pulmonary to see if this is asthma exacerbation and to evaluate her breathing, may require adjustments to respiratory meds. She will see Geraldo Pitter, NP on 4/30 at 2 PM, the appt details were given to Ms. Ronnald Ramp and her son today.  -I recommend to repeat echo, last done 08/2018 with EF 55-60%, to see if she has decline in ejection fraction.  -she prefers to hold chemo today.  Certainly If this is cancer-related dyspnea, she will need to continue chemo to control pulmonary mets. She understands.  -She can try allergy meds to see if this helps -she will return for f/u and treatment on 5/3.   2. Malignant neoplasm of upper-outer quadrant of left breast, Invasive Ductal Carcinoma, pT2pN1M0, stage IIB, ER+/PR-/HER2-, G3, mammaprint high risk, axillarynodesrecurrence in 06/2019, lung nodules in 10/2019, metastatic to lung, bone, LNs, liver in 01/2020 -She was initiallydiagnosed in 11/2016. She was treated with left lumpectomy and SLN sampling,re-excision surgery, Adjuvant TC for 4 cycles andadjuvantradiation.  -She was on adjuvant Letrozole from 06/2017-08/14/19 -Unfortunately she had local recurrence in left axillaandunderwent Left axillary lymph node dissection by Dr. Brantley Stage on 08/05/19.She had complete resection. -ShecontinuedExemestane and addedoral biological agent Verzenio 1836m BID starting3/4/21. -PETscanfrom 4/7/21showedwide spread disease with soft tissue recurrence in the left axilla, enlarging LNs in chest, new lung pleural and parenchymal disease, metastatic disease of the right liver lobe and signs of bone metastasis. There is also new pleural effusions presumably malignant and associated with pleural nodularity.Shehaddeveloped fatigue, dyspnea, and anorexia lately, likely from her metastatic disease. -Based on the scan findings,Dr. FBurr Medicodid not recommend biopsy given the clinical picture.  - FO results showedRET, CDK8, NOTCH1, TP53 mutations.Notargetable mutation for now.  -Due to the rapid disease progression through AI and CDK4/6 inhibitor,and multiple visceral organ involvement,Dr. FBurr Medicorecommended single agent Abraxane871mm2 weekly,3 weeks on/1 week off for 3-6 monthsthen repeat PET scan. The goal is palliative, to control her disease, prolong her life, and improve quality of life.  -S/p C1D1 on 01/20/20, Abraxane on hold for  dyspnea  3. Low appetite, nausea, B/l abdominal pain, Diarrhea/constipation -Shehas mild intermittentnausea which is controlled with antiemetics, andoccasional abdominal pain and occasional diarrhea/constipation.  -She has metamucil and prune juice as needed which helps her bowels.Due to recent loose stool, I encouraged her to use imodium PRN and remain adequately hydrated.  -She can useGlucerna to help her maintain weight. -Dr. FeBurr Medicoalled inMirtazapine 7.36m47m4/8/21to help her appetite, which improved organically when she stopped verzenio and exemestane. She has not started appetite stimulant yet. I encouraged her to do so if appetite decreases after starting abraxane.  4. Dry cough and SOB,lung metastasis -her baseline cough and exertional dyspnea progressed lately around the time she had her PET scan, but stable since then -Her PCP referred her to pulmonologist Dr. ManAnnye English in the process of adjusting inhalers/respiratory meds -She has receivedboth herCOVID19 vaccinations. -Her PET from 01/07/20 showed new pulmonary pleural and parenchymal disease and presumably malignant pleural effusions. Overall indications of her breast cancer metastasized to lung. -she has progressive dyspnea lately, no PE, pleural effusion, or infection. Work up is ongoing. She will see pulmonary on 4/30  5. Left arm numbness/tinglingandLeft arm lymphedema, secondary to surgery. -secondary to malignant left axillary adenopathy, s/p ALND in 08/2019 -She still has left armand handlymphedema.  -She is currently doing PT  3 times a week and wrapping her arm -PAC has not been placed yet, use right arm for peripheral access  6. HTN, DM, CKD III -F/u with PCPand endocrinologist.  -DM is not well controlled. she is on insulin. Patient states a provider told her to hold metformin for now.  -lisinopril-HCTZ remains on hold until renal function improves. Cr 2.55 today. I encouraged her to drink  more.  -BP 105/45 today   7. Mild anemia -Iron and TIBC, B12, MMA normal -mildand stable.Hgb 9.0   8. Osteopenia  -09/2018 DEXA shows osteopeniawith lowest T-score -1.1 -She recently stopped AI due to disease progression -Next DEXA in 09/2020  8. Financialand SocialSupport  -She paid $165 out of pocket for her exemestane, which she stopped in 01/2020 due to disease progression -Cancontact financial advocatefor copay assistance grant as needed -She gets help with her son but he is looking to return to working. -Sw assistance as needed  9. Goal of care discussion  -we reviewed that due to her rapid disease progression, her cancer is not curable with chemo alone at this point. The goal is palliative, to prolong her life and improve quality of life. She understands and agrees. -The patient understands the goal of care is palliative. -no code status on file. Dr. Burr Medico has recommended DNR/DNI previously. Patient is thinking about it  PLAN: -Labs reviewed -Hold chemo today -Continue dyspea work up, pulm f/u on 4/30 at 2 pm patient aware -Echo asap  -F/u and chemo 5/3 -continue respiratory meds, she will try allergy pill PRN to see if this helps  -hold lisinopril-HCTZ, push po hydration for renal dysfunction   No problem-specific Assessment & Plan notes found for this encounter.   Orders Placed This Encounter  Procedures  . ECHOCARDIOGRAM COMPLETE    Standing Status:   Future    Standing Expiration Date:   04/29/2021    Order Specific Question:   Where should this test be performed    Answer:   [facility]    Order Specific Question:   Perflutren DEFINITY (image enhancing agent) should be administered unless hypersensitivity or allergy exist    Answer:   Administer Perflutren    Order Specific Question:   Is a special reader required? (athlete or structural heart)    Answer:   No    Order Specific Question:   Reason for exam-Echo    Answer:   Dyspnea  786.09 /  R06.00   All questions were answered. The patient knows to call the clinic with any problems, questions or concerns. No barriers to learning was detected. Total encounter time was 35 minutes      Alla Feeling, NP 01/29/20

## 2020-01-29 ENCOUNTER — Inpatient Hospital Stay: Payer: Medicare Other

## 2020-01-29 ENCOUNTER — Inpatient Hospital Stay (HOSPITAL_BASED_OUTPATIENT_CLINIC_OR_DEPARTMENT_OTHER): Payer: Medicare Other | Admitting: Nurse Practitioner

## 2020-01-29 ENCOUNTER — Other Ambulatory Visit: Payer: Self-pay

## 2020-01-29 ENCOUNTER — Encounter: Payer: Self-pay | Admitting: Nurse Practitioner

## 2020-01-29 VITALS — BP 105/45 | HR 65 | Temp 98.3°F | Resp 18 | Ht 62.0 in | Wt 196.3 lb

## 2020-01-29 DIAGNOSIS — C50412 Malignant neoplasm of upper-outer quadrant of left female breast: Secondary | ICD-10-CM

## 2020-01-29 DIAGNOSIS — Z170 Estrogen receptor positive status [ER+]: Secondary | ICD-10-CM

## 2020-01-29 DIAGNOSIS — R0602 Shortness of breath: Secondary | ICD-10-CM

## 2020-01-29 DIAGNOSIS — C7951 Secondary malignant neoplasm of bone: Secondary | ICD-10-CM | POA: Diagnosis not present

## 2020-01-29 DIAGNOSIS — C787 Secondary malignant neoplasm of liver and intrahepatic bile duct: Secondary | ICD-10-CM | POA: Diagnosis not present

## 2020-01-29 DIAGNOSIS — C779 Secondary and unspecified malignant neoplasm of lymph node, unspecified: Secondary | ICD-10-CM | POA: Diagnosis not present

## 2020-01-29 DIAGNOSIS — Z5111 Encounter for antineoplastic chemotherapy: Secondary | ICD-10-CM | POA: Diagnosis not present

## 2020-01-29 DIAGNOSIS — C7800 Secondary malignant neoplasm of unspecified lung: Secondary | ICD-10-CM | POA: Diagnosis not present

## 2020-01-29 LAB — CBC WITH DIFFERENTIAL/PLATELET
Abs Immature Granulocytes: 0.04 10*3/uL (ref 0.00–0.07)
Basophils Absolute: 0 10*3/uL (ref 0.0–0.1)
Basophils Relative: 1 %
Eosinophils Absolute: 0 10*3/uL (ref 0.0–0.5)
Eosinophils Relative: 1 %
HCT: 25.9 % — ABNORMAL LOW (ref 36.0–46.0)
Hemoglobin: 9 g/dL — ABNORMAL LOW (ref 12.0–15.0)
Immature Granulocytes: 1 %
Lymphocytes Relative: 24 %
Lymphs Abs: 0.8 10*3/uL (ref 0.7–4.0)
MCH: 29.8 pg (ref 26.0–34.0)
MCHC: 34.7 g/dL (ref 30.0–36.0)
MCV: 85.8 fL (ref 80.0–100.0)
Monocytes Absolute: 0.3 10*3/uL (ref 0.1–1.0)
Monocytes Relative: 9 %
Neutro Abs: 2.1 10*3/uL (ref 1.7–7.7)
Neutrophils Relative %: 64 %
Platelets: 315 10*3/uL (ref 150–400)
RBC: 3.02 MIL/uL — ABNORMAL LOW (ref 3.87–5.11)
RDW: 24.1 % — ABNORMAL HIGH (ref 11.5–15.5)
WBC: 3.2 10*3/uL — ABNORMAL LOW (ref 4.0–10.5)
nRBC: 0 % (ref 0.0–0.2)

## 2020-01-29 LAB — COMPREHENSIVE METABOLIC PANEL
ALT: <6 U/L (ref 0–44)
AST: 13 U/L — ABNORMAL LOW (ref 15–41)
Albumin: 3.8 g/dL (ref 3.5–5.0)
Alkaline Phosphatase: 91 U/L (ref 38–126)
Anion gap: 12 (ref 5–15)
BUN: 31 mg/dL — ABNORMAL HIGH (ref 8–23)
CO2: 22 mmol/L (ref 22–32)
Calcium: 9.1 mg/dL (ref 8.9–10.3)
Chloride: 110 mmol/L (ref 98–111)
Creatinine, Ser: 2.55 mg/dL — ABNORMAL HIGH (ref 0.44–1.00)
GFR calc Af Amer: 20 mL/min — ABNORMAL LOW (ref >60)
GFR calc non Af Amer: 17 mL/min — ABNORMAL LOW (ref >60)
Glucose, Bld: 103 mg/dL — ABNORMAL HIGH (ref 70–99)
Potassium: 3.7 mmol/L (ref 3.5–5.1)
Sodium: 144 mmol/L (ref 135–145)
Total Bilirubin: 0.3 mg/dL (ref 0.3–1.2)
Total Protein: 7.5 g/dL (ref 6.5–8.1)

## 2020-01-29 NOTE — Progress Notes (Addendum)
[name]   [phone] [phone]   Clinic Follow up Note   Patient Care Team: Marin Olp, MD as PCP - General (Family Medicine) Philemon Kingdom, MD as Consulting Physician (Internal Medicine) Jalene Mullet, MD as Consulting Physician (Ophthalmology) Erroll Luna, MD as Consulting Physician (General Surgery) Kyung Rudd, MD as Consulting Physician (Radiation Oncology) [name], MD as Consulting Physician (Hematology) Gardenia Phlegm, NP as Nurse Practitioner (Hematology and Oncology) 02/02/2020  CHIEF COMPLAINT: F/u  Metastatic breast cancer, dyspnea   SUMMARY OF ONCOLOGIC HISTORY: Oncology History Overview Note  Cancer Staging Malignant neoplasm of upper-outer quadrant of left breast in female, estrogen receptor positive ([place]) Staging form: Breast, AJCC 8th Edition - Clinical: Stage IIB (cT2, cN0, cM0, G3, ER: Positive, PR: Negative, HER2: Negative) - Signed by [name], MD on 11/23/2016 - Pathologic stage from 12/28/2016: Stage IIB (pT2, pN1a(sn), cM0, G3, ER: Positive, PR: Negative, HER2: Negative) - Signed by [name], MD on 01/02/2017     Malignant neoplasm of upper-outer quadrant of left breast in female, estrogen receptor positive ([place])  11/01/2016 Mammogram   Diagnostic mammogram and ultrasound of left breast and axilla showed a 3.1 x 2.1 x 1.4 cm (3.3 x 2.0 x 2.7 cm by ultrasound) mass in the upper outer quadrant of the anterior third of the left breast, associated with pleomorphic calcification. There is a 8 mm (1.8cm by [name]) prominent lymph node in the left axilla.   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   11/07/2016 Receptors her2   ER 80% positive, PR negative, HER-2 negative, Ki-67 90%   11/20/2016 Initial Diagnosis   Malignant neoplasm of upper-outer quadrant of left breast in female, estrogen receptor  positive ([place])   12/07/2016 Surgery   Left lumpectomy and left axillary sentinel lymph node sampling by Dr. Brantley Stage   12/07/2016 Pathology Results   pT2, pN1 Left Lumpectomy: Grade 3 IDC measuring 3.4 cm, carcinoma broadly present at the superior margin. Grade 3 DCIS. 1 out of 2 left axillary SLN positive for metastatic carcinoma   12/07/2016 Miscellaneous   Mammaprint showed high risk disease, basal type    12/28/2016 Surgery   Re-excision of the previously positive superior margin was negative for malignant cells.    01/04/2017 Surgery   Port inserted   01/18/2017 - 03/22/2017 Chemotherapy   Adjuvant Docetaxel 75 mg/m and Cytoxan 600 mg/m, every 21 days, for total of 4 cycles, with Neulasta on day 2.    04/23/2017 - 05/18/2017 Radiation Therapy   Radiation treatment dates:   04/23/17 - 05/18/17 Administered by Dr. Lisbeth Renshaw  Site/dose:    Left breast/ 42.5 Gy in 17 Fx Boost / 7.5 Gy in 3 Fx   06/12/2017 - 08/14/2019 Anti-estrogen oral therapy   Adjuvant letrozole 1 mg daily, plan for 5-7 years. D/c on 08/14/19 due to local recurrence of left axilla.     07/20/2017 Mammogram   IMPRESSION: No mammographic evidence of malignancy in either breast. 3.8 cm left breast postsurgical loculated seroma.   09/12/2017 Survivorship     09/19/2018 Imaging   Baseline DEXA 09/19/18 ASSESSMENT: The BMD measured at Femur Neck Left is 0.888 g/cm2 with a T-score of -1.1. This patient is considered osteopenic according to [facility]) criteria.   The scan quality is good. L-4 was excluded due to degenerative changes.   Site Region Measured Date Measured Age [age]A BMD  Significant CHANGE T-score AP Spine  L1-L3      09/19/2018    [age]         -0.7    1.084 g/cm2   DualFemur Neck Left  09/19/2018    [age]         -1.1    0.888 g/cm2   DualFemur Total Mean 09/19/2018    [age]         -0.5    0.946 g/cm2 ASSESSMENT: The probability of a major osteoporotic fracture is 4.5 % within  the next ten years.   The probability of hip fracture is 0.8  % within the next 10 years.   06/25/2019 Mammogram   Diagnostic mammogram 06/25/19  IMPRESSION: 1.  Two morphologically abnormal lymph nodes in the left axilla with a lymph node in the lower left axilla measuring 2.4 x 2 x 2.6 cm.   2. Stable lumpectomy changes left breast with no findings of malignancy in either breast.   07/01/2019 Pathology Results   Diagnosis Lymph node, needle/core biopsy, left axilla - POORLY DIFFERENTIATED CARCINOMA CONSISTENT WITH BREAST PRIMARY. - NO LYMPHOID NODAL TISSUE IDENTIFIED. - SEE MICROSCOPIC DESCRIPTION. Microscopic Comment The carcinoma is consistent with grade III.  PROGNOSTIC INDICATORS Results: IMMUNOHISTOCHEMICAL AND MORPHOMETRIC ANALYSIS PERFORMED MANUALLY The tumor cells are NEGATIVE for Her2 (1+). Estrogen Receptor: 80%, POSITIVE, WEAK STAINING INTENSITY Progesterone Receptor: 0%, NEGATIVE Proliferation Marker Ki67: 80%   07/18/2019 Breast MRI   Breast MRI 07/18/19  IMPRESSION: 1. Two morphologically abnormal level 1 left axillary lymph nodes correlating with biopsy-proven malignancy. 2. No MRI evidence of malignancy in either breast. 3. Postsurgical changes of the left breast consistent with prior lumpectomy.   07/25/2019 PET scan   PET 07/25/19  IMPRESSION: 1. Hypermetabolic LEFT axillary mass consistent with metastatic breast cancer. 2. High LEFT axillary/posterior triangle (level 5) lymph node consistent with local metastasis. 3. No central mediastinal nodal metastasis. No suspicious pulmonary nodules. 4. No distant soft tissue metastasis or skeletal metastasis. 5. Calcified fibroid uterus   08/05/2019 Surgery   LEFT AXILLARY LYMPH NODE DISSECTION by Dr. Brantley Stage 08/05/19    08/05/2019 Pathology Results   FINAL MICROSCOPIC DIAGNOSIS: 08/05/19    A. LYMPH NODES, LEFT AXILLARY, DISSECTION:  - Metastatic carcinoma.  - See comment.    08/2019 - 01/13/2020  Anti-estrogen oral therapy   Exemestane 50m starting 08/2019. Stopped with the start of chemo in 01/2020   09/01/2019 Genetic Testing   [employer] Genomic Findings:  -RET amplification -CDK8 amplification -NOTCH1 deletion  -TFT73splice site [id number]  14/27/0623Imaging   CT Angio Chest   IMPRESSION: 1. No definitive pulmonary embolism. 2. Multiple new small bilateral pulmonary nodules consistent with metastatic disease. 3. Soft tissue density in the left axilla at the site of node dissection, probably representing scarring but I cannot exclude tumor recurrence. 4. New partially healed fracture of the posterior aspect of the right eighth rib.   12/04/2019 - 01/13/2020 Chemotherapy   Verzenio 1562mBID starting 12/04/19. Stopped a week before the start of chemo in 01/2020   01/07/2020 PET scan   IMPRESSION: 1. Marked worsening of disease with soft tissue recurrence in the left axilla, new and enlarging lymph nodes in the supraclavicular/high axillary region, new mediastinal adenopathy and new pulmonary pleural and parenchymal disease. 2. Metastatic disease to the right hepatic lobe. 3. Signs of bony metastasis. 4. New pleural effusions presumably malignant and associated with pleural nodularity.   01/20/2020 -  Chemotherapy   The patient had PACLitaxel-protein  bound (ABRAXANE) chemo infusion 150 mg, 80 mg/m2 = 150 mg, Intravenous,  Once, 1 of 4 cycles Administration: 150 mg (01/20/2020), 150 mg (02/02/2020)  for chemotherapy treatment.    01/21/2020 - 01/21/2020 Chemotherapy   The patient had PACLitaxel (TAXOL) 156 mg in sodium chloride 0.9 % 250 mL chemo infusion (</= 31m/m2), 80 mg/m2, Intravenous,  Once, 0 of 4 cycles  for chemotherapy treatment.      CURRENT THERAPY:  Systemic chemotherapy, Abraxane on days 1, 8, 15 q28 days; started 01/20/20  INTERVAL HISTORY: Wendy. JChayreturns for f/u and what would be cycle 1 day 15 chemo. She completed C1D1 abraxane on 01/20/20. When she  returned for D8 on 4/26 she was severely dyspneic on exertion. Work up was unrevealing, negative for infection, pleural effusion, COVID19, PE. She was re-evaluated on 4/29 and had persistent dyspnea in clinic. Treatment was held. She was seen by pulm on 4/30 and treated with prednisone and singulair for possible asthma exacerbation as well as famotidine for nocturnal cough.   Today she presents in a wheelchair by herself. She feels the same. Dyspnea not improved on steroids. She was able to ADLs and cook over the weekend, but remains short of breath. Dry intermittent cough at baseline. Denies fever, chills, chest pain. Appetite is increased. Denies n/v/d or change in chronic constipation. She denies pain. Left arm lymphedema unchanged. No new concerns. She thinks she has been taking lisinopril-HCTZ but not sure.    MEDICAL HISTORY:  Past Medical History:  Diagnosis Date  . Abnormal breast finding 2018   per pt/ having  a lot drainage from left breast nipple  . Anemia   . Anxiety   . [place]   left/  . CKD (chronic kidney disease)   . Depression   . GERD (gastroesophageal reflux disease)    TUMS as needed  . HTN (hypertension)    states BP has been high recently; has been on med. x 20 yr.  . Hyperlipidemia   . Hyperplastic colon polyp   . HYPERTENSION, BENIGN SYSTEMIC 11/29/2006   Lisinopril hctz 10-12.539m amlodipine 1075mcoreg 43m13mD, clonidine 0.3 mg BID.   . InMarland Kitchenulin dependent diabetes mellitus   . Personal history of chemotherapy    2018/finished 6 weeks of chemo in Sep 2018  . Personal history of radiation therapy    2018 left breast/finished radiation in Sept 2018 per pt.    SURGICAL HISTORY: Past Surgical History:  Procedure Laterality Date  . AXILLARY LYMPH NODE DISSECTION Left 08/05/2019   Procedure: LEFT AXILLARY LYMPH NODE DISSECTION;  Surgeon: CornErroll Luna;  [place];  Laterality: Left;  . BREAST  LUMPECTOMY Left 05/05/2008  . BREAST LUMPECTOMY Left 12/07/2016   malignant  . BREAST LUMPECTOMY WITH RADIOACTIVE SEED AND SENTINEL LYMPH NODE BIOPSY Left 12/07/2016   Procedure: LEFT BREAST LUMPECTOMY WITH RADIOACTIVE SEED AND SENTINEL LYMPH NODE BIOPSY;  Surgeon: ThomErroll Luna;  [facility] [place];  Laterality: Left;  . IR FLUORO GUIDE PORT INSERTION RIGHT  01/04/2017  . IR US G[place] VASC ACCESS RIGHT  01/04/2017  . PORT-A-CATH REMOVAL N/A 05/30/2017   Procedure: REMOVAL PORT-A-CATH;  Surgeon: CornErroll Luna;  [place] [facility];  Laterality: N/A;  . RE-EXCISION OF BREAST LUMPECTOMY Left 12/28/2016   Procedure: RE-EXCISION OF BREAST LUMPECTOMY;  Surgeon: ThomErroll Luna;  [facility];  Laterality: Left;  I have reviewed the social history and family history with the patient and they are unchanged from previous note.  ALLERGIES:  has No Known Allergies.  MEDICATIONS:  Current Outpatient Medications  Medication Sig Dispense Refill  . albuterol (VENTOLIN HFA) 108 (90 Base) MCG/ACT inhaler Inhale 2 puffs into the lungs every 6 (six) hours as needed for wheezing or shortness of breath. 18 g 2  . amLODipine (NORVASC) 10 MG tablet TAKE 1 TABLET BY MOUTH EVERY DAY (Patient taking differently: Take 10 mg by mouth daily. ) 90 tablet 1  . aspirin 81 MG tablet Take 81 mg by mouth daily.      . B-D INS SYRINGE 0.5CC/31GX5/16 31G X 5/16" 0.5 ML MISC USE AS DIRECTED 4 TIMES A DAY 300 each 1  . busPIRone (BUSPAR) 10 MG tablet Take 1 tablet (10 mg total) by mouth 3 (three) times daily. 270 tablet 1  . carvedilol (COREG) 25 MG tablet Take 1 tablet (25 mg total) by mouth 2 (two) times daily with a meal. 180 tablet 3  . cloNIDine (CATAPRES) 0.3 MG tablet TAKE 1 TABLET (0.3 MG TOTAL) BY MOUTH 2 (TWO) TIMES DAILY. 180 tablet 1  . famotidine (PEPCID) 20 MG tablet Take 1 tablet (20 mg total) by mouth daily. 30  tablet 1  . fluticasone (FLONASE) 50 MCG/ACT nasal spray Place 1 spray into both nostrils daily. 16 g 2  . fluticasone furoate-vilanterol (BREO ELLIPTA) 200-25 MCG/INH AEPB Inhale 1 puff into the lungs daily. 60 each 3  . fluticasone furoate-vilanterol (BREO ELLIPTA) 200-25 MCG/INH AEPB Inhale 1 puff into the lungs daily. 2 each 0  . Insulin Glargine (LANTUS SOLOSTAR) 100 UNIT/ML Solostar Pen Inject 28 units into the skin in am (Patient taking differently: Inject 28 Units into the skin every morning. ) 10 pen 3  . Insulin Pen Needle 31G X 5 MM MISC Use pen needles for insulin injection daily 100 each 3  . insulin regular (NOVOLIN R) 100 units/mL injection Inject 0.14-0.18 mLs (14-18 Units total) into the skin 3 (three) times daily before meals. Relion. Please provide insulin syringes needed (Patient taking differently: Inject 10-16 Units into the skin See admin instructions. 16 units in the morning and 10 units at night) 20 mL 11  . Insulin Syringe-Needle U-100 (B-D INS SYRINGE 0.5CC/30GX1/2") 30G X 1/2" 0.5 ML MISC USE AS DIRECTED 4 TIMES A DAY 300 each 3  . lidocaine-prilocaine (EMLA) cream Apply to affected area once 30 g 3  . lisinopril-hydrochlorothiazide (ZESTORETIC) 20-12.5 MG tablet Take 1 tablet by mouth daily. 90 tablet 3  . lovastatin (MEVACOR) 40 MG tablet TAKE 1 TABLET BY MOUTH EVERYDAY AT BEDTIME (Patient taking differently: Take 40 mg by mouth at bedtime. ) 90 tablet 3  . mirtazapine (REMERON) 7.5 MG tablet Take 1 tablet (7.5 mg total) by mouth at bedtime. 30 tablet 0  . montelukast (SINGULAIR) 10 MG tablet Take 1 tablet (10 mg total) by mouth at bedtime. 30 tablet 3  . ondansetron (ZOFRAN) 8 MG tablet Take 1 tablet (8 mg total) by mouth 2 (two) times daily as needed (Nausea or vomiting). 30 tablet 1  . OVER THE COUNTER MEDICATION Take 1 tablet by mouth daily as needed. Stool softner for constipation, also drinks warm prune juice prn    . polyethylene glycol powder (GLYCOLAX/MIRALAX) 17  GM/SCOOP powder Take 17 g by mouth daily. (Patient taking differently: Take 17 g by mouth as needed for mild constipation or moderate constipation. ) 1 g 0  .  predniSONE (DELTASONE) 10 MG tablet Take 2 tabs x 5 days 10 tablet 0  . spironolactone (ALDACTONE) 25 MG tablet Take 0.5 tablets (12.5 mg total) by mouth daily. 15 tablet 3  . traMADol (ULTRAM) 50 MG tablet TAKE 1 TABLET BY MOUTH EVERY 6 HOURS AS NEEDED FOR MODERATE/SEVERE PAIN (NECK AND SHOULDER PAIN). (Patient taking differently: Take 50 mg by mouth every 6 (six) hours as needed for moderate pain or severe pain (neck and shoulder pain). ) 20 tablet 0  . HYDROcodone-acetaminophen (NORCO/VICODIN) 5-325 MG tablet Take 1 tablet by mouth every 8 (eight) hours as needed for moderate pain. 15 tablet 0   No current facility-administered medications for this visit.    PHYSICAL EXAMINATION: ECOG PERFORMANCE STATUS: 1-2  Vitals:   02/02/20 0827  BP: 120/64  Pulse: 60  Resp: 18  Temp: 98.5 F (36.9 C)  SpO2: 100%   Filed Weights   02/02/20 0827  Weight: 201 lb (91.2 kg)    GENERAL:alert, no distress and comfortable SKIN: no rash  EYES: sclera clear NECK: without mass  LYMPH:  Left upper extremity lymphedema  LUNGS: clear with normal breathing effort at rest. Mild to moderate increased work of breathing on ambulation  HEART: regular rate & rhythm, mild lower extremity edema NEURO: alert & oriented x 3 with fluent speech Breast exam deferred   LABORATORY DATA:  I have reviewed the data as listed CBC Latest Ref Rng & Units 02/02/2020 01/29/2020 01/26/2020  WBC 4.0 - 10.5 K/uL 3.8(L) 3.2(L) 3.4(L)  Hemoglobin 12.0 - 15.0 g/dL 7.7(L) 9.0(L) 9.7(L)  Hematocrit 36.0 - 46.0 % 22.8(L) 25.9(L) 28.1(L)  Platelets 150 - 400 K/uL 296 315 271     CMP Latest Ref Rng & Units 02/02/2020 01/29/2020 01/26/2020  Glucose 70 - 99 mg/dL 282(H) 103(H) 250(H)  BUN 8 - 23 mg/dL 29(H) 31(H) 24(H)  Creatinine 0.44 - 1.00 mg/dL 1.89(H) 2.55(H) 2.08(H)    Sodium 135 - 145 mmol/L 138 144 144  Potassium 3.5 - 5.1 mmol/L 4.3 3.7 4.0  Chloride 98 - 111 mmol/L 107 110 108  CO2 22 - 32 mmol/L 21(L) 22 25  Calcium 8.9 - 10.3 mg/dL 8.5(L) 9.1 9.7  Total Protein 6.5 - 8.1 g/dL 6.6 7.5 8.3(H)  Total Bilirubin 0.3 - 1.2 mg/dL 0.3 0.3 0.4  Alkaline Phos 38 - 126 U/L 81 91 98  AST 15 - 41 U/L 9(L) 13(L) 16  ALT 0 - 44 U/L 7 <6 6      RADIOGRAPHIC STUDIES: I have personally reviewed the radiological images as listed and agreed with the findings in the report. No results found.   ASSESSMENT & PLAN: 79 yo female with   1. Dyspnea  -she has lung metastasis and h/o asthma, mild exertional dyspnea at baseline that progressed over the last few days to weeks, now severe causing difficulty with ADLs -on 01/26/20, she was mildly tachycardia in clinic, pulse ox drops to 94% on RA during ambulation -anemia stable in 9 range, likely not the source of her worsening dyspnea  -chest xray 4/26 shows small pleural effusions, no new signs of infection  -D-dimer in clinic >20 on 01/26/20 and repeated to confirm, she was referred to ED due to concern for PE. V/Q scan was negative.  -on 01/29/20 she remained dyspneic on minimal exertion, increased WOB around clinic. Again, not hypoxic, O2 sat remains 96-100% during ambulation. Lungs remain clear -I referred her back to pulmonary which she saw on 4/30 to r/o asthma exacerbation, she was  treated with prednisone and singulair and continued breo. She was given famotidine for nocturnal cough. Dyspnea did not improve much. Clinically, her breathing was slightly less labored on ambulation in clinic today.  -A repeat echo is pending to see if her dyspnea is cardiac-related, last done 08/2018 with EF 55-60% -Wendy. [name] appears stable. This is possibly related to her pulmonary disease and lung metastasis, she agrees to resume chemo today. Will monitor dyspnea closely.    2. Malignant neoplasm of upper-outer quadrant of left  breast, Invasive Ductal Carcinoma, pT2pN1M0, stage IIB, ER+/PR-/HER2-, G3, mammaprint high risk, axillarynodesrecurrence in 06/2019, lung nodules in 10/2019, metastatic to lung, bone, LNs, liver in 01/2020 -She was initiallydiagnosed in 11/2016. She was treated with left lumpectomy and SLN sampling,re-excision surgery, Adjuvant TC for 4 cycles andadjuvantradiation.  -She was on adjuvant Letrozole from 06/2017-08/14/19 -Unfortunately she had local recurrence in left axillaandunderwent Left axillary lymph node dissection by Dr. Brantley Stage on 08/05/19.She had complete resection. -ShecontinuedExemestane and addedoral biological agent Verzenio 157m BID starting3/4/21. -PETscanfrom 4/7/21showedwide spread disease with soft tissue recurrence in the left axilla, enlarging LNs in chest, new lung pleural and parenchymal disease, metastatic disease of the right liver lobe and signs of bone metastasis. There is also new pleural effusions presumably malignant and associated with pleural nodularity.Shehaddeveloped fatigue, dyspnea, and anorexia lately, likely from her metastatic disease. -Based on the scan findings,Dr. FBurr Medicodid not recommend biopsy given the clinical picture.  - FO results showedRET, CDK8, NOTCH1, TP53 mutations.Notargetable mutation for now.  -Due to the rapid disease progression through AI and CDK4/6 inhibitor,and multiple visceral organ involvement,Dr. FBurr Medicorecommended single agent Abraxane894mm2 weekly,3 weeks on/1 week off for 3-6 months. The goal is palliative, to control her disease, prolong her life, and improve quality of life. -S/p C1D1 on 01/20/20, she tolerated well overall. C1D8 was postponed for dyspnea work up. Wendy. JoMemonppears stable today. CBC and CMP stable except Hgb worsened to 7.7. Cr improved this week. Labs adequate for treatment. She will resume cycle 1 day 8 Abraxane today.  -Plan to scan her again in about a month, or sooner if she is not doing well  clinically.  -F/u and C1D15 next week if she is doing well, then 1 week off.   3. Low appetite, nausea, B/l abdominal pain, Diarrhea/constipation -Shehas mild intermittentnausea which is controlled with antiemetics, andoccasional abdominal pain and occasional diarrhea/constipation.  -She has metamucil and prune juice as needed which helps her bowels.Due to recent loose stool, I encouraged her to use imodium PRN and remain adequately hydrated.  -She can useGlucerna to help her maintain weight. -Dr. FeBurr Medicoalled inMirtazapine 7.50m59m4/8/21to help her appetite, which improved organically when she stopped verzenio and exemestane. She has not started appetite stimulant yet. I encouraged her to do so if appetite decreases after starting abraxane.  4. Dry cough and SOB,lung metastasis -her baseline cough and exertional dyspnea progressed lately around the time she had her PET scan, but stable since then -Her PCP referred her to pulmonologist Dr. ManAnnye English in the process of adjusting inhalers/respiratory meds -She has receivedboth herCOVID19 vaccinations. -Her PET from 01/07/20 showed new pulmonary pleural and parenchymal disease and presumably malignant pleural effusions. Overall indications of her breast cancer metastasized to lung. -she has progressive dyspnea lately, no PE or infection, she has small pleural effusion. She was seen by pulmonary on 4/30 who treated her for asthma exacerbation with breo, singulair and prednisone which she will completed on 5/4. Dyspnea did not significantly improve on this regimen  -  this is likely cancer-related. The patient was seen today with Dr. Burr Medico who recommends to resume chemo. She will proceed with C1D8 abraxane today.  -continue monitoring   5. Left arm numbness/tinglingandLeft arm lymphedema, secondary to surgery. -secondary to malignant left axillary adenopathy, s/p ALND in 08/2019 -She still has left armand handlymphedema.  -She  is currently doing PT 3 times a week and wrapping her arm -PAC has not been placed yet, use right arm for peripheral access  6. HTN, DM, CKD III -F/u with PCPand endocrinologist.  -DM is not well controlled. she is on insulin. BG 282 today -Cr. Improved to 1.89, I recommended to hold lisinopril-HCTZ last week but she thinks she continued to take it. Continue monitoring   7. Mild anemia -Iron and TIBC, B12, MMA normal -Her mild anemia was stable last week, 9-9.7, however her hgb dropped today to 7.7. This may be contributing to her dyspnea lately. I recommend to transfuse 2 units RBCs this week. We reviewed the potential risk/benefit and possible side effects, she agrees to proceed.   8. Osteopenia  -09/2018 DEXA shows osteopeniawith lowest T-score -1.1 -She recently stopped AI due to disease progression -Next DEXA in 09/2020  9. Financialand SocialSupport  -She paid $165 out of pocket for her exemestane, which she stopped in 01/2020 due to disease progression -Cancontact financial advocatefor copay assistance grant as needed -She gets help with her son but he is looking to return to working. -[place] assistance as needed  10. Goal of care discussion  -we reviewed that due to her rapid disease progression, her cancer is not curable with chemo alone at this point. The goal is palliative, to prolong her life and improve quality of life. She understands and agrees. -The patient understands the goal of care is palliative. -no code status on file. Dr. Burr Medico has recommended DNR/DNI previously. Patient is thinking about it   PLAN: -Labs reviewed -Hgb 7.7, will get 2 units pRBCs this week. Patient consented -Proceed with cycle 1 day 8 Abraxane today -Return for f/u and day 15 abraxane next week, then a week off -Restage in approx 1 month  -Patient seen with Dr. Burr Medico  -echo pending   No problem-specific Assessment & Plan notes found for this encounter.   Orders Placed This  Encounter  Procedures  . Care order/instruction    Transfuse Parameters    Standing Status:   Future    Standing Expiration Date:   02/01/2021  . Informed Consent Details: Physician/Practitioner Attestation; Transcribe to consent form and obtain patient signature    Standing Status:   Future    Standing Expiration Date:   02/01/2021    Order Specific Question:   Physician/Practitioner attestation of informed consent for blood and or blood product transfusion    Answer:   I, the physician/practitioner, attest that I have discussed with the patient the benefits, risks, side effects, alternatives, likelihood of achieving goals and potential problems during recovery for the procedure that I have provided informed consent.    Order Specific Question:   Product(s)    Answer:   All Product(s)  . Type and screen         Standing Status:   Future    Number of Occurrences:   1    Standing Expiration Date:   02/01/2021   All questions were answered. The patient knows to call the clinic with any problems, questions or concerns. No barriers to learning was detected.     Alla Feeling, NP  02/02/20   Addendum  I have seen the patient, examined her. I agree with the assessment and and plan and have edited the notes.   Wendy Screws complains significant dyspnea on exertion. Her recent ED visit and work up was negative for PE. She is moderately anemia, which contributes to her dyspnea. I thinks her symptoms are mainly related to her lung mets and pleural effusion. She is not hypoxic. Will give blood transfusion and proceed with chemo today.   [name]  02/02/2020

## 2020-01-30 ENCOUNTER — Ambulatory Visit (INDEPENDENT_AMBULATORY_CARE_PROVIDER_SITE_OTHER): Payer: Medicare Other | Admitting: Primary Care

## 2020-01-30 ENCOUNTER — Encounter: Payer: Self-pay | Admitting: Primary Care

## 2020-01-30 DIAGNOSIS — J301 Allergic rhinitis due to pollen: Secondary | ICD-10-CM | POA: Diagnosis not present

## 2020-01-30 DIAGNOSIS — K219 Gastro-esophageal reflux disease without esophagitis: Secondary | ICD-10-CM

## 2020-01-30 DIAGNOSIS — J4541 Moderate persistent asthma with (acute) exacerbation: Secondary | ICD-10-CM

## 2020-01-30 MED ORDER — FAMOTIDINE 20 MG PO TABS
20.0000 mg | ORAL_TABLET | Freq: Every day | ORAL | 1 refills | Status: DC
Start: 2020-01-30 — End: 2020-02-23

## 2020-01-30 MED ORDER — PREDNISONE 10 MG PO TABS: ORAL_TABLET | ORAL | 0 refills | Status: DC

## 2020-01-30 MED ORDER — FLUTICASONE PROPIONATE 50 MCG/ACT NA SUSP
1.0000 | Freq: Every day | NASAL | 2 refills | Status: DC
Start: 2020-01-30 — End: 2020-04-22

## 2020-01-30 MED ORDER — MONTELUKAST SODIUM 10 MG PO TABS
10.0000 mg | ORAL_TABLET | Freq: Every day | ORAL | 3 refills | Status: DC
Start: 2020-01-30 — End: 2020-03-25

## 2020-01-30 NOTE — Progress Notes (Signed)
@Patient  ID: Wendy [name], female    DOB: 30-Jun-1941, 79 y.o.   MRN: [id number]  Chief Complaint  Patient presents with  . Follow-up    feeling more SOB    Referring provider: Marin Olp, MD  HPI: 79 year old female, never smoked. PMH significant for left breast cancer, restrictive lung disease, hypertension, diastolic dysfunction, type 2 diabetes, obesity, CKD. Patient of Dr. Vaughan Browner, last seen on 12/03/19. Following with Oncology. Saw pharmacy on 4/12, changing from Breo 200 to Symbicort until she applied to Medicaid. Applied for patient assistance program through [place] through [facility].  Diagnosed with left breast cancer in 2018, treated with left lupectomy and SLN sampling, re-excision surgery. Adjuvant TC for 4 cycles and adjuvant radiation. She was on adjuvant Adjuvant  Letrozole from September 2018- November 2020. She had local recurrent in left axilla and underwent left axillary lymph node dissection by Dr. Brantley Stage on 08/05/19. She had complete resection. Continued Exemestaine and added oral biologic Verzenio starting 12/04/19. PET scan 01/07/20 showed wide spread disease with enlarging LNs in change, new lung pleural and parenchymal disease, metastatic disease of right liver lobe and signs of bone metastasis. Due to rapid disease progression Dr. Burr Medico recommended single agent Abraxane for 3-6 months then repeat PET. The goal is palliative, disease control, prolong and improve quality of life.    01/30/2020  Patient was seen in ED on 4/26 for shortness of breath. D-dimer was elevated at 20. VQ scan negative for PE. New lung metastasis and hx asthma. Patient had ambulatory walk yesterday with oncology and her oxygen level remained 96-100% on RA. Oncology wanted patient to be seen today to see if her dyspnea was related to asthma exacerbation vs cancer related dyspnea. Her last PFTs were in January 2020.   Shortness of breath has progressively worsened over the last month and a half.  States that she can not get any relief. Feels that she has a lot of congestion in her chest. She has an occasional cough. Unable to get mucus up. Experiences moderate dyspnea with walking. She has some associated wheezing and post nasal drip. She uses BREO in the morning, does not feel that this is helping. She uses albuterol rescue inhaler 2-3 times a week but this does not provider relief. She has been ordered for an echocardiogram with oncology. Next chemotherapy is scheduled for Monday.    No Known Allergies  Immunization History  Administered Date(s) Administered  . Fluad Quad(high Dose 65+) 07/24/2019  . Influenza Split 07/06/2011, 10/25/2012, 06/07/2018  . Influenza Whole 07/06/2008, 08/30/2009, 09/15/2010  . Influenza, High Dose Seasonal PF 07/10/2017  . Influenza,inj,Quad PF,6+ Mos 07/03/2013, 06/16/2015, 10/06/2016  . Influenza-Unspecified 07/04/2019  . Pneumococcal Conjugate-13 07/12/2015  . Pneumococcal Polysaccharide-23 08/03/1999, 12/12/2011  . Td 03/03/1999    Past Medical History:  Diagnosis Date  . Abnormal breast finding 2018   per pt/ having  a lot drainage from left breast nipple  . Anemia   . Anxiety   . Breast cancer ([place]) 11/2016   left/  . CKD (chronic kidney disease)   . Depression   . GERD (gastroesophageal reflux disease)    TUMS as needed  . HTN (hypertension)    states BP has been high recently; has been on med. x 20 yr.  . Hyperlipidemia   . Hyperplastic colon polyp   . HYPERTENSION, BENIGN SYSTEMIC 11/29/2006   Lisinopril hctz 10-12.5mg , amlodipine 10mg , coreg 25mg  BID, clonidine 0.3 mg BID.   Marland Kitchen Insulin  dependent diabetes mellitus   . Personal history of chemotherapy    2018/finished 6 weeks of chemo in Sep 2018  . Personal history of radiation therapy    2018 left breast/finished radiation in Sept 2018 per pt.    Tobacco History: Social History   Tobacco Use  Smoking Status Never Smoker  Smokeless Tobacco Former Systems developer  . Types: Snuff    Counseling given: Not Answered   Outpatient Medications Prior to Visit  Medication Sig Dispense Refill  . albuterol (VENTOLIN HFA) 108 (90 Base) MCG/ACT inhaler Inhale 2 puffs into the lungs every 6 (six) hours as needed for wheezing or shortness of breath. 18 g 2  . amLODipine (NORVASC) 10 MG tablet TAKE 1 TABLET BY MOUTH EVERY DAY (Patient taking differently: Take 10 mg by mouth daily. ) 90 tablet 1  . aspirin 81 MG tablet Take 81 mg by mouth daily.      . B-D INS SYRINGE 0.5CC/31GX5/16 31G X 5/16" 0.5 ML MISC USE AS DIRECTED 4 TIMES A DAY 300 each 1  . busPIRone (BUSPAR) 10 MG tablet Take 1 tablet (10 mg total) by mouth 3 (three) times daily. 270 tablet 1  . carvedilol (COREG) 25 MG tablet Take 1 tablet (25 mg total) by mouth 2 (two) times daily with a meal. 180 tablet 3  . cloNIDine (CATAPRES) 0.3 MG tablet TAKE 1 TABLET (0.3 MG TOTAL) BY MOUTH 2 (TWO) TIMES DAILY. 180 tablet 1  . fluticasone furoate-vilanterol (BREO ELLIPTA) 200-25 MCG/INH AEPB Inhale 1 puff into the lungs daily. 60 each 3  . fluticasone furoate-vilanterol (BREO ELLIPTA) 200-25 MCG/INH AEPB Inhale 1 puff into the lungs daily. 2 each 0  . HYDROcodone-acetaminophen (NORCO/VICODIN) 5-325 MG tablet Take 1 tablet by mouth every 8 (eight) hours as needed for moderate pain. 15 tablet 0  . Insulin Glargine (LANTUS SOLOSTAR) 100 UNIT/ML Solostar Pen Inject 28 units into the skin in am (Patient taking differently: Inject 28 Units into the skin every morning. ) 10 pen 3  . Insulin Pen Needle 31G X 5 MM MISC Use pen needles for insulin injection daily 100 each 3  . insulin regular (NOVOLIN R) 100 units/mL injection Inject 0.14-0.18 mLs (14-18 Units total) into the skin 3 (three) times daily before meals. Relion. Please provide insulin syringes needed (Patient taking differently: Inject 10-16 Units into the skin See admin instructions. 16 units in the morning and 10 units at night) 20 mL 11  . Insulin Syringe-Needle U-100 (B-D INS  SYRINGE 0.5CC/30GX1/2") 30G X 1/2" 0.5 ML MISC USE AS DIRECTED 4 TIMES A DAY 300 each 3  . lidocaine-prilocaine (EMLA) cream Apply to affected area once 30 g 3  . lisinopril-hydrochlorothiazide (ZESTORETIC) 20-12.5 MG tablet Take 1 tablet by mouth daily. 90 tablet 3  . lovastatin (MEVACOR) 40 MG tablet TAKE 1 TABLET BY MOUTH EVERYDAY AT BEDTIME (Patient taking differently: Take 40 mg by mouth at bedtime. ) 90 tablet 3  . mirtazapine (REMERON) 7.5 MG tablet Take 1 tablet (7.5 mg total) by mouth at bedtime. 30 tablet 0  . ondansetron (ZOFRAN) 8 MG tablet Take 1 tablet (8 mg total) by mouth 2 (two) times daily as needed (Nausea or vomiting). 30 tablet 1  . OVER THE COUNTER MEDICATION Take 1 tablet by mouth daily as needed. Stool softner for constipation, also drinks warm prune juice prn    . polyethylene glycol powder (GLYCOLAX/MIRALAX) 17 GM/SCOOP powder Take 17 g by mouth daily. (Patient taking differently: Take 17 g by mouth as  needed for mild constipation or moderate constipation. ) 1 g 0  . spironolactone (ALDACTONE) 25 MG tablet Take 0.5 tablets (12.5 mg total) by mouth daily. 15 tablet 3  . traMADol (ULTRAM) 50 MG tablet TAKE 1 TABLET BY MOUTH EVERY 6 HOURS AS NEEDED FOR MODERATE/SEVERE PAIN (NECK AND SHOULDER PAIN). (Patient taking differently: Take 50 mg by mouth every 6 (six) hours as needed for moderate pain or severe pain (neck and shoulder pain). ) 20 tablet 0  . fluticasone (FLONASE) 50 MCG/ACT nasal spray Place 2 sprays into both nostrils daily. 16 g 3   No facility-administered medications prior to visit.    Review of Systems  Review of Systems  HENT: Positive for congestion and postnasal drip.   Respiratory: Positive for cough, shortness of breath and wheezing.     Physical Exam  BP (!) 102/42 (BP Location: Right Arm, Cuff Size: Normal)   Pulse 74   Temp 97.7 F (36.5 C) (Temporal)   Ht 5\' 2"  (1.575 m)   Wt 199 lb (90.3 kg)   LMP  (LMP Unknown)   SpO2 99%   BMI 36.40  kg/m  Physical Exam Constitutional:      Comments: Chronically ill  HENT:     Head: Normocephalic.  Cardiovascular:     Rate and Rhythm: Normal rate.     Comments: Trace edema Pulmonary:     Comments: Diminished Musculoskeletal:     Comments: In WC, gait no assessed  Skin:    General: Skin is warm and dry.  Neurological:     General: No focal deficit present.     Mental Status: She is alert and oriented to person, place, and time. Mental status is at baseline.  Psychiatric:        Thought Content: Thought content normal.        Judgment: Judgment normal.      Lab Results:  CBC    Component Value Date/Time   WBC 3.2 (L) 01/29/2020 0724   RBC 3.02 (L) 01/29/2020 0724   HGB 9.0 (L) 01/29/2020 0724   HGB 11.4 (L) 06/12/2017 1005   HCT 25.9 (L) 01/29/2020 0724   HCT 33.6 (L) 04/02/2019 1035   HCT 34.0 (L) 06/12/2017 1005   PLT 315 01/29/2020 0724   PLT 218 06/12/2017 1005   MCV 85.8 01/29/2020 0724   MCV 82.9 06/12/2017 1005   MCH 29.8 01/29/2020 0724   MCHC 34.7 01/29/2020 0724   RDW 24.1 (H) 01/29/2020 0724   RDW 15.4 (H) 06/12/2017 1005   LYMPHSABS 0.8 01/29/2020 0724   LYMPHSABS 0.7 (L) 06/12/2017 1005   MONOABS 0.3 01/29/2020 0724   MONOABS 0.2 06/12/2017 1005   EOSABS 0.0 01/29/2020 0724   EOSABS 0.1 06/12/2017 1005   BASOSABS 0.0 01/29/2020 0724   BASOSABS 0.0 06/12/2017 1005    BMET    Component Value Date/Time   NA 144 01/29/2020 0724   NA 141 06/12/2017 1005   K 3.7 01/29/2020 0724   K 4.0 06/12/2017 1005   CL 110 01/29/2020 0724   CO2 22 01/29/2020 0724   CO2 26 06/12/2017 1005   GLUCOSE 103 (H) 01/29/2020 0724   GLUCOSE 184 (H) 06/12/2017 1005   BUN 31 (H) 01/29/2020 0724   BUN 14.9 06/12/2017 1005   CREATININE 2.55 (H) 01/29/2020 0724   CREATININE 1.66 (H) 11/07/2019 1531   CREATININE 1.1 06/12/2017 1005   CALCIUM 9.1 01/29/2020 0724   CALCIUM 9.6 06/12/2017 1005   GFRNONAA 17 (L) 01/29/2020 0258  GFRAA 20 (L) 01/29/2020 0724     BNP No results found for: BNP  ProBNP    Component Value Date/Time   PROBNP CANCELED 09/15/2019 1552   PROBNP 44.0 07/16/2018 1137    Imaging: DG Chest 2 View  Result Date: 01/26/2020 CLINICAL DATA:  Dyspnea. History of metastatic breast cancer. EXAM: CHEST - 2 VIEW COMPARISON:  September 15, 2019. FINDINGS: The heart size and mediastinal contours are within normal limits. No pneumothorax is noted. Small bilateral pleural effusions are noted with associated bibasilar subsegmental atelectasis. The visualized skeletal structures are unremarkable. IMPRESSION: Small bilateral pleural effusions with associated bibasilar subsegmental atelectasis. Electronically Signed   By: Marijo Conception M.D.   On: 01/26/2020 13:58   NM PULMONARY VENT AND PERF (V/Q Scan)  Result Date: 01/26/2020 CLINICAL DATA:  Shortness of breath EXAM: NUCLEAR MEDICINE VENTILATION - PERFUSION LUNG SCAN TECHNIQUE: Ventilation images were obtained in multiple projections using inhaled aerosol Tc-54m DTPA. Perfusion images were obtained in multiple projections after intravenous injection of Tc-38m MAA. RADIOPHARMACEUTICALS:  1.6 mCi Tc32m MAA IV, was unable to complete the ventilation portion COMPARISON:  Radiograph same day FINDINGS: Perfusion: No wedge shaped peripheral perfusion defects to suggest acute pulmonary embolism. Blunting of the bilateral costophrenic angles, consistent with the patient's known small bilateral pleural effusions. IMPRESSION: No large wedge-shaped perfusion defects. Electronically Signed   By: Prudencio Pair M.D.   On: 01/26/2020 22:50   NM PET Image Restag (PS) Skull Base To Thigh  Result Date: 01/07/2020 CLINICAL DATA:  Subsequent treatment strategy for breast cancer with lumpectomy and left axillary dissection in the past. Patient was found to have disease on PET exam from 07/25/2019. EXAM: NUCLEAR MEDICINE PET SKULL BASE TO THIGH TECHNIQUE: 9.9 mCi F-18 FDG was injected intravenously. Full-ring PET  imaging was performed from the skull base to thigh after the radiotracer. CT data was obtained and used for attenuation correction and anatomic localization. Fasting blood glucose: 180 mg/dl COMPARISON:  10/31/2019 and 07/25/2019 FINDINGS: Mediastinal blood pool activity: SUV max 3.44 Liver activity: SUV max NA NECK: No hypermetabolic lymph nodes in the neck. Incidental CT findings: none CHEST: Left supraclavicular lymph nodes (image 41, series 4) (image 40, series 4) each measuring approximately 17-18 mm short axis largest in this location previously 14 mm (SUVmax = 9.7 previously 5.7 registration changed based on arm position on today's study.) The more medial of these nodes is new though more lateral was present previously. Left axillary mass (image 67, series 4) 4.8 x 2.5 cm, this is below axillary surgical clips presumably following resection of previous nodal mass in this location (SUVmax = 12.4 other small areas of nodularity with slightly less FDG uptake seen posterior to the dominant lesion in the left axillary region. For example on image 72 of series 4 there is a 7 mm soft tissue nodule. Intense FDG uptake along the superior margin of postoperative changes in the left axilla (image 56, series 4) 7 mm greatest thickness (SUVmax = 8.1 other small lymph nodes with slightly less FDG uptake tracking along subpectoral chain on the left. New mediastinal adenopathy (image 59, series 4) right paratracheal lymph node measuring 14 mm (SUVmax = 10.1) Subcarinal lymph nodes previously showing mild FDG uptake now with marked increased FDG uptake (image 64, series 4) short axis dimension 13 mm for the largest (SUVmax = 9.1) previously 3.7 New pleural disease in the posterior right costodiaphragmatic sulcus just above the right hemi liver (image 83, series 4) 13 x 21 mm (SUVmax =  5.3.) Numerous new pulmonary nodules and pleural nodularity bilaterally pleural nodularity also present in the left lung base as well on image  46 of series 8 also showing increased metabolic activity. Example of pulmonary nodules with several new nodule seen in the right upper lobe (image 12, series 8) largest approximately 10 mm. Left upper lobe nodule (image 16, series 8) 8 mm less avid nodal disease and bone disease, see below. Incidental CT findings: New pleural effusions. Small pericardial effusion. Mild cardiac enlargement. Scattered atherosclerotic changes of the thoracic aorta. ABDOMEN/PELVIS: Hepatic lesion (image 87, series 4) measuring approximately 2.5 cm (SUVmax = 7.4 in the posterior right hepatic lobe, hepatic subsegment VII) Left retroperitoneal lymph node unchanged and without increased FDG uptake (image 127, series 4) 11 mm Diffuse uptake in the left psoas muscle, asymmetric but without changes in the muscle that would suggest disease in terms of its CT appearance Incidental CT findings: Bilateral renal cysts. Atherosclerotic changes in the abdominal aorta. Fibroid uterus with calcifications. Diverticulosis. SKELETON: Right posterior eighth rib with intense metabolic activity, expansile changes and subacute or chronic appearing pathologic fracture (image 72, series 4) (SUVmax = 7.8) Left scapular activity without definable lesion (SUVmax = 7.6 involving the superior left scapula. Is Other areas of rib activity scattered about that are suspicious for early rib involvement. Muscular involvement and some brown fat activity confound assessment. Left iliac metastasis (image 140, series 4): (SUVmax = 4.7 no discernible lesion in this location on CT image.) Incidental CT findings: none IMPRESSION: 1. Marked worsening of disease with soft tissue recurrence in the left axilla, new and enlarging lymph nodes in the supraclavicular/high axillary region, new mediastinal adenopathy and new pulmonary pleural and parenchymal disease. 2. Metastatic disease to the right hepatic lobe. 3. Signs of bony metastasis. 4. New pleural effusions presumably  malignant and associated with pleural nodularity. Electronically Signed   By: Zetta Bills M.D.   On: 01/07/2020 18:19     Assessment & Plan:   Moderate persistent asthma - Increased dyspnea with associated wheezing and PND. Unclear if true asthma exacerbation or related to metastatic breast cancer with new pleural disease. Will treat with 5 day course of oral prednisone, add montelukast and famotidine for GERD which could be contributing to upper airway wheezing and dyspnea   - Continue Breo 200 one puff daily  - RX prednisone 20mg  x 5 days - FU in 2-3 weeks  Allergic rhinitis - Start Flonase 1 spray per nostril daily  - RX Singulair 10mg  at bedtime   GERD (gastroesophageal reflux disease) - Reports nocturnal cough, associated upper airway wheezing - Trial Famotidine 20mg  daily    Martyn Ehrich, NP 01/31/2020

## 2020-01-30 NOTE — Patient Instructions (Addendum)
Asthma: Continue Breo one puff daily in the morning (submitting paper work for [employer] for Symbicort) Start Singulair 10mg  at bedtime Start Famotidine 10mg  daily for reflux/GERD Prednisone 20mg  x 5 days   Allergic rhinitis: Start Flonase nasal spray  Follow-up: 2-3 weeks with Dr. Vaughan Browner or next available

## 2020-01-31 DIAGNOSIS — J309 Allergic rhinitis, unspecified: Secondary | ICD-10-CM | POA: Insufficient documentation

## 2020-01-31 DIAGNOSIS — J4540 Moderate persistent asthma, uncomplicated: Secondary | ICD-10-CM | POA: Insufficient documentation

## 2020-01-31 DIAGNOSIS — K219 Gastro-esophageal reflux disease without esophagitis: Secondary | ICD-10-CM | POA: Insufficient documentation

## 2020-01-31 NOTE — Assessment & Plan Note (Addendum)
-   Increased dyspnea with associated wheezing and PND. Unclear if true asthma exacerbation or related to metastatic breast cancer with new pleural disease. Will treat with 5 day course of oral prednisone, add montelukast and famotidine for GERD which could be contributing to upper airway wheezing and dyspnea   - Continue Breo 200 one puff daily  - RX prednisone 20mg  x 5 days - FU in 2-3 weeks

## 2020-01-31 NOTE — Assessment & Plan Note (Signed)
-   Reports nocturnal cough, associated upper airway wheezing - Trial Famotidine 20mg  daily

## 2020-01-31 NOTE — Assessment & Plan Note (Addendum)
-   Start Flonase 1 spray per nostril daily  - RX Singulair 10mg  at bedtime

## 2020-02-02 ENCOUNTER — Other Ambulatory Visit: Payer: Self-pay

## 2020-02-02 ENCOUNTER — Inpatient Hospital Stay (HOSPITAL_BASED_OUTPATIENT_CLINIC_OR_DEPARTMENT_OTHER): Payer: Medicare Other | Admitting: Nurse Practitioner

## 2020-02-02 ENCOUNTER — Encounter: Payer: Self-pay | Admitting: Nurse Practitioner

## 2020-02-02 ENCOUNTER — Inpatient Hospital Stay: Payer: Medicare Other

## 2020-02-02 ENCOUNTER — Inpatient Hospital Stay: Payer: Medicare Other | Attending: Hematology

## 2020-02-02 VITALS — BP 120/64 | HR 60 | Temp 98.5°F | Resp 18 | Ht 62.0 in | Wt 201.0 lb

## 2020-02-02 VITALS — BP 131/81 | HR 72 | Temp 97.9°F | Resp 17

## 2020-02-02 DIAGNOSIS — C50412 Malignant neoplasm of upper-outer quadrant of left female breast: Secondary | ICD-10-CM

## 2020-02-02 DIAGNOSIS — D649 Anemia, unspecified: Secondary | ICD-10-CM | POA: Insufficient documentation

## 2020-02-02 DIAGNOSIS — C7800 Secondary malignant neoplasm of unspecified lung: Secondary | ICD-10-CM | POA: Diagnosis not present

## 2020-02-02 DIAGNOSIS — Z170 Estrogen receptor positive status [ER+]: Secondary | ICD-10-CM

## 2020-02-02 DIAGNOSIS — R0602 Shortness of breath: Secondary | ICD-10-CM | POA: Diagnosis not present

## 2020-02-02 DIAGNOSIS — Z20822 Contact with and (suspected) exposure to covid-19: Secondary | ICD-10-CM | POA: Insufficient documentation

## 2020-02-02 DIAGNOSIS — Z5111 Encounter for antineoplastic chemotherapy: Secondary | ICD-10-CM | POA: Insufficient documentation

## 2020-02-02 DIAGNOSIS — C773 Secondary and unspecified malignant neoplasm of axilla and upper limb lymph nodes: Secondary | ICD-10-CM | POA: Insufficient documentation

## 2020-02-02 DIAGNOSIS — Z7189 Other specified counseling: Secondary | ICD-10-CM

## 2020-02-02 LAB — COMPREHENSIVE METABOLIC PANEL
ALT: 7 U/L (ref 0–44)
AST: 9 U/L — ABNORMAL LOW (ref 15–41)
Albumin: 3.3 g/dL — ABNORMAL LOW (ref 3.5–5.0)
Alkaline Phosphatase: 81 U/L (ref 38–126)
Anion gap: 10 (ref 5–15)
BUN: 29 mg/dL — ABNORMAL HIGH (ref 8–23)
CO2: 21 mmol/L — ABNORMAL LOW (ref 22–32)
Calcium: 8.5 mg/dL — ABNORMAL LOW (ref 8.9–10.3)
Chloride: 107 mmol/L (ref 98–111)
Creatinine, Ser: 1.89 mg/dL — ABNORMAL HIGH (ref 0.44–1.00)
GFR calc Af Amer: 29 mL/min — ABNORMAL LOW (ref >60)
GFR calc non Af Amer: 25 mL/min — ABNORMAL LOW (ref >60)
Glucose, Bld: 282 mg/dL — ABNORMAL HIGH (ref 70–99)
Potassium: 4.3 mmol/L (ref 3.5–5.1)
Sodium: 138 mmol/L (ref 135–145)
Total Bilirubin: 0.3 mg/dL (ref 0.3–1.2)
Total Protein: 6.6 g/dL (ref 6.5–8.1)

## 2020-02-02 LAB — CBC WITH DIFFERENTIAL/PLATELET
Abs Immature Granulocytes: 0.04 10*3/uL (ref 0.00–0.07)
Basophils Absolute: 0 10*3/uL (ref 0.0–0.1)
Basophils Relative: 0 %
Eosinophils Absolute: 0 10*3/uL (ref 0.0–0.5)
Eosinophils Relative: 0 %
HCT: 22.8 % — ABNORMAL LOW (ref 36.0–46.0)
Hemoglobin: 7.7 g/dL — ABNORMAL LOW (ref 12.0–15.0)
Immature Granulocytes: 1 %
Lymphocytes Relative: 20 %
Lymphs Abs: 0.8 10*3/uL (ref 0.7–4.0)
MCH: 29.3 pg (ref 26.0–34.0)
MCHC: 33.8 g/dL (ref 30.0–36.0)
MCV: 86.7 fL (ref 80.0–100.0)
Monocytes Absolute: 0.4 10*3/uL (ref 0.1–1.0)
Monocytes Relative: 10 %
Neutro Abs: 2.6 10*3/uL (ref 1.7–7.7)
Neutrophils Relative %: 69 %
Platelets: 296 10*3/uL (ref 150–400)
RBC: 2.63 MIL/uL — ABNORMAL LOW (ref 3.87–5.11)
RDW: 24.3 % — ABNORMAL HIGH (ref 11.5–15.5)
WBC: 3.8 10*3/uL — ABNORMAL LOW (ref 4.0–10.5)
nRBC: 0.5 % — ABNORMAL HIGH (ref 0.0–0.2)

## 2020-02-02 LAB — ABO/RH: ABO/RH(D): B POS

## 2020-02-02 LAB — PREPARE RBC (CROSSMATCH)

## 2020-02-02 MED ORDER — PACLITAXEL PROTEIN-BOUND CHEMO INJECTION 100 MG
80.0000 mg/m2 | Freq: Once | INTRAVENOUS | Status: AC
  Administered 2020-02-02: 150 mg via INTRAVENOUS
  Filled 2020-02-02: qty 30

## 2020-02-02 MED ORDER — PROCHLORPERAZINE MALEATE 10 MG PO TABS
10.0000 mg | ORAL_TABLET | Freq: Once | ORAL | Status: AC
  Administered 2020-02-02: 10 mg via ORAL

## 2020-02-02 MED ORDER — SODIUM CHLORIDE 0.9 % IV SOLN
Freq: Once | INTRAVENOUS | Status: AC
  Filled 2020-02-02: qty 250

## 2020-02-02 MED ORDER — PROCHLORPERAZINE MALEATE 10 MG PO TABS
ORAL_TABLET | ORAL | Status: AC
  Filled 2020-02-02: qty 1

## 2020-02-02 NOTE — Progress Notes (Signed)
Okay to treat with elevated creat and Hgb 7.7 per Cira Rue NP

## 2020-02-02 NOTE — Patient Instructions (Signed)
[facility] Discharge Instructions for Patients Receiving Chemotherapy  Today you received the following chemotherapy agents: Abraxane  To help prevent nausea and vomiting after your treatment, we encourage you to take your nausea medication as directed.   If you develop nausea and vomiting that is not controlled by your nausea medication, call the clinic.   BELOW ARE SYMPTOMS THAT SHOULD BE REPORTED IMMEDIATELY:  *FEVER GREATER THAN 100.5 F  *CHILLS WITH OR WITHOUT FEVER  NAUSEA AND VOMITING THAT IS NOT CONTROLLED WITH YOUR NAUSEA MEDICATION  *UNUSUAL SHORTNESS OF BREATH  *UNUSUAL BRUISING OR BLEEDING  TENDERNESS IN MOUTH AND THROAT WITH OR WITHOUT PRESENCE OF ULCERS  *URINARY PROBLEMS  *BOWEL PROBLEMS  UNUSUAL RASH Items with * indicate a potential emergency and should be followed up as soon as possible.  Feel free to call the clinic should you have any questions or concerns. The clinic phone number is [phone].  Please show the [facility] at check-in to the Emergency Department and triage nurse.   Blood Transfusion, Adult, Care After This sheet gives you information about how to care for yourself after your procedure. Your doctor may also give you more specific instructions. If you have problems or questions, contact your doctor. What can I expect after the procedure? After the procedure, it is common to have:  Bruising and soreness at the IV site.  A fever or chills on the day of the procedure. This may be your body's response to the new blood cells received.  A headache. Follow these instructions at home: Insertion site care      Follow instructions from your doctor about how to take care of your insertion site. This is where an IV tube was put into your vein. Make sure you: ? Wash your hands with soap and water before and after you change your bandage (dressing). If you cannot use soap and water, use hand sanitizer. ? Change your  bandage as told by your doctor.  Check your insertion site every day for signs of infection. Check for: ? Redness, swelling, or pain. ? Bleeding from the site. ? Warmth. ? Pus or a bad smell. General instructions  Take over-the-counter and prescription medicines only as told by your doctor.  Rest as told by your doctor.  Go back to your normal activities as told by your doctor.  Keep all follow-up visits as told by your doctor. This is important. Contact a doctor if:  You have itching or red, swollen areas of skin (hives).  You feel worried or nervous (anxious).  You feel weak after doing your normal activities.  You have redness, swelling, warmth, or pain around the insertion site.  You have blood coming from the insertion site, and the blood does not stop with pressure.  You have pus or a bad smell coming from the insertion site. Get help right away if:  You have signs of a serious reaction. This may be coming from an allergy or the body's defense system (immune system). Signs include: ? Trouble breathing or shortness of breath. ? Swelling of the face or feeling warm (flushed). ? Fever or chills. ? Head, chest, or back pain. ? Dark pee (urine) or blood in the pee. ? Widespread rash. ? Fast heartbeat. ? Feeling dizzy or light-headed. You may receive your blood transfusion in an outpatient setting. If so, you will be told whom to contact to report any reactions. These symptoms may be an emergency. Do not wait to see  if the symptoms will go away. Get medical help right away. Call your local emergency services (911 in the [place] Do not drive yourself to the hospital. Summary  Bruising and soreness at the IV site are common.  Check your insertion site every day for signs of infection.  Rest as told by your doctor. Go back to your normal activities as told by your doctor.  Get help right away if you have signs of a serious reaction. This information is not intended to  replace advice given to you by your health care provider. Make sure you discuss any questions you have with your health care provider. Document Revised: 03/13/2019 Document Reviewed: 03/13/2019 Elsevier Patient Education  [place].

## 2020-02-03 ENCOUNTER — Other Ambulatory Visit: Payer: Self-pay

## 2020-02-03 ENCOUNTER — Inpatient Hospital Stay: Payer: Medicare Other

## 2020-02-03 ENCOUNTER — Telehealth: Payer: Self-pay | Admitting: Nurse Practitioner

## 2020-02-03 ENCOUNTER — Telehealth: Payer: Self-pay | Admitting: Family Medicine

## 2020-02-03 DIAGNOSIS — R0602 Shortness of breath: Secondary | ICD-10-CM

## 2020-02-03 DIAGNOSIS — D649 Anemia, unspecified: Secondary | ICD-10-CM | POA: Diagnosis not present

## 2020-02-03 DIAGNOSIS — C50412 Malignant neoplasm of upper-outer quadrant of left female breast: Secondary | ICD-10-CM | POA: Diagnosis not present

## 2020-02-03 DIAGNOSIS — Z170 Estrogen receptor positive status [ER+]: Secondary | ICD-10-CM | POA: Diagnosis not present

## 2020-02-03 DIAGNOSIS — Z20822 Contact with and (suspected) exposure to covid-19: Secondary | ICD-10-CM | POA: Diagnosis not present

## 2020-02-03 DIAGNOSIS — C7800 Secondary malignant neoplasm of unspecified lung: Secondary | ICD-10-CM | POA: Diagnosis not present

## 2020-02-03 DIAGNOSIS — Z5111 Encounter for antineoplastic chemotherapy: Secondary | ICD-10-CM | POA: Diagnosis not present

## 2020-02-03 MED ORDER — SODIUM CHLORIDE 0.9% IV SOLUTION
250.0000 mL | Freq: Once | INTRAVENOUS | Status: AC
  Administered 2020-02-03: 250 mL via INTRAVENOUS
  Filled 2020-02-03: qty 250

## 2020-02-03 NOTE — Patient Instructions (Signed)
Blood Transfusion, Adult A blood transfusion is a procedure in which you receive blood through an IV tube. You may need this procedure because of:  A bleeding disorder.  An illness.  An injury.  A surgery. The blood may come from someone else (a donor). You may also be able to donate blood for yourself. The blood given in a transfusion is made up of different types of cells. You may get:  Red blood cells. These carry oxygen to the cells in the body.  White blood cells. These help you fight infections.  Platelets. These help your blood to clot.  Plasma. This is the liquid part of your blood. It carries proteins and other substances through the body. If you have a clotting disorder, you may also get other types of blood products. Tell your doctor about:  Any blood disorders you have.  Any reactions you have had during a blood transfusion in the past.  Any allergies you have.  All medicines you are taking, including vitamins, herbs, eye drops, creams, and over-the-counter medicines.  Any surgeries you have had.  Any medical conditions you have. This includes any recent fever or cold symptoms.  Whether you are pregnant or may be pregnant. What are the risks? Generally, this is a safe procedure. However, problems may occur.  The most common problems include: ? A mild allergic reaction. This includes red, swollen areas of skin (hives) and itching. ? Fever or chills. This may be the body's response to new blood cells received. This may happen during or up to 4 hours after the transfusion.  More serious problems may include: ? Too much fluid in the lungs. This may cause breathing problems. ? A serious allergic reaction. This includes breathing trouble or swelling around the face and lips. ? Lung injury. This causes breathing trouble and low oxygen in the blood. This can happen within hours of the transfusion or days later. ? Too much iron. This can happen after getting many  blood transfusions over a period of time. ? An infection or virus passed through the blood. This is rare. Donated blood is carefully tested before it is given. ? Your body's defense system (immune system) trying to attack the new blood cells. This is rare. Symptoms may include fever, chills, nausea, low blood pressure, and low back or chest pain. ? Donated cells attacking healthy tissues. This is rare. What happens before the procedure? Medicines Ask your doctor about:  Changing or stopping your normal medicines. This is important.  Taking aspirin and ibuprofen. Do not take these medicines unless your doctor tells you to take them.  Taking over-the-counter medicines, vitamins, herbs, and supplements. General instructions  Follow instructions from your doctor about what you cannot eat or drink.  You will have a blood test to find out your blood type. The test also finds out what type of blood your body will accept and matches it to the donor type.  If you are going to have a planned surgery, you may be able to donate your own blood. This may be done in case you need a transfusion.  You will have your temperature, blood pressure, and pulse checked.  You may receive medicine to help prevent an allergic reaction. This may be done if you have had a reaction to a transfusion before. This medicine may be given to you by mouth or through an IV tube.  This procedure lasts about 1-4 hours. Plan for the time you need. What happens during the   procedure?   An IV tube will be put into one of your veins.  The bag of donated blood will be attached to your IV tube. Then, the blood will enter through your vein.  Your temperature, blood pressure, and pulse will be checked often. This is done to find early signs of a transfusion reaction.  Tell your nurse right away if you have any of these symptoms: ? Shortness of breath or trouble breathing. ? Chest or back pain. ? Fever or chills. ? Red,  swollen areas of skin or itching.  If you have any signs or symptoms of a reaction, your transfusion will be stopped. You may also be given medicine.  When the transfusion is finished, your IV tube will be taken out.  Pressure may be put on the IV site for a few minutes.  A bandage (dressing) will be put on the IV site. The procedure may vary among doctors and hospitals. What happens after the procedure?  You will be monitored until you leave the hospital or clinic. This includes checking your temperature, blood pressure, pulse, breathing rate, and blood oxygen level.  Your blood may be tested to see how you are responding to the transfusion.  You may be warmed with fluids or blankets. This is done to keep the temperature of your body normal.  If you have your procedure in an outpatient setting, you will be told whom to contact to report any reactions. Where to find more information To learn more, visit the [employer]: redcross.org Summary  A blood transfusion is a procedure in which you are given blood through an IV tube.  The blood may come from someone else (a donor). You may also be able to donate blood for yourself.  The blood you are given is made up of different blood cells. You may receive red blood cells, platelets, plasma, or white blood cells.  Your temperature, blood pressure, and pulse will be checked often.  After the procedure, your blood may be tested to see how you are responding. This information is not intended to replace advice given to you by your health care provider. Make sure you discuss any questions you have with your health care provider. Document Revised: 03/13/2019 Document Reviewed: 03/13/2019 Elsevier Patient Education  2020 [place] (COVID-19) Are you at risk?  Are you at risk for the Coronavirus (COVID-19)?  To be considered HIGH RISK for Coronavirus (COVID-19), you have to meet the following criteria:  . Traveled  to [place], [place], [place], [place] or [place]; or in the [place] to [place], [place], [place], or [place]; and have fever, cough, and shortness of breath within the last 2 weeks of travel OR . Been in close contact with a person diagnosed with COVID-19 within the last 2 weeks and have fever, cough, and shortness of breath . IF YOU DO NOT MEET THESE CRITERIA, YOU ARE CONSIDERED LOW RISK FOR COVID-19.  What to do if you are HIGH RISK for COVID-19?  Marland Kitchen If you are having a medical emergency, call 911. . Seek medical care right away. Before you go to a doctor's office, urgent care or emergency department, call ahead and tell them about your recent travel, contact with someone diagnosed with COVID-19, and your symptoms. You should receive instructions from your physician's office regarding next steps of care.  . When you arrive at healthcare provider, tell the healthcare staff immediately you have returned from visiting [place], [place], [place], [place]  or [place]; or traveled in the [place] to [place], [place], [place], or [place]; in the last two weeks or you have been in close contact with a person diagnosed with COVID-19 in the last 2 weeks.   . Tell the health care staff about your symptoms: fever, cough and shortness of breath. . After you have been seen by a medical provider, you will be either: o Tested for (COVID-19) and discharged home on quarantine except to seek medical care if symptoms worsen, and asked to  - Stay home and avoid contact with others until you get your results (4-5 days)  - Avoid travel on public transportation if possible (such as bus, train, or airplane) or o Sent to the Emergency Department by EMS for evaluation, COVID-19 testing, and possible admission depending on your condition and test results.  What to do if you are LOW RISK for COVID-19?  Reduce your risk of any infection by using the same precautions used for avoiding the common cold or  flu:  Marland Kitchen Wash your hands often with soap and warm water for at least 20 seconds.  If soap and water are not readily available, use an alcohol-based hand sanitizer with at least 60% alcohol.  . If coughing or sneezing, cover your mouth and nose by coughing or sneezing into the elbow areas of your shirt or coat, into a tissue or into your sleeve (not your hands). . Avoid shaking hands with others and consider head nods or verbal greetings only. . Avoid touching your eyes, nose, or mouth with unwashed hands.  . Avoid close contact with people who are sick. . Avoid places or events with large numbers of people in one location, like concerts or sporting events. . Carefully consider travel plans you have or are making. . If you are planning any travel outside or inside the [name], visit the CDC's Travelers' Health webpage for the latest health notices. . If you have some symptoms but not all symptoms, continue to monitor at home and seek medical attention if your symptoms worsen. . If you are having a medical emergency, call 911.   [place] / e-Visit: [email or web address]         [facility]: [facility]: [phone]                   [facility]: [phone]

## 2020-02-03 NOTE — Progress Notes (Signed)
Pharmacist Chemotherapy Monitoring - Follow Up Assessment    I verify that I have reviewed each item in the below checklist:  . Regimen for the patient is scheduled for the appropriate day and plan matches scheduled date. Marland Kitchen Appropriate non-routine labs are ordered dependent on drug ordered. . If applicable, additional medications reviewed and ordered per protocol based on lifetime cumulative doses and/or treatment regimen.   Plan for follow-up and/or issues identified: No . I-vent associated with next due treatment: Yes . MD and/or nursing notified: No  Wendy [name] K 02/03/2020 11:16 AM

## 2020-02-03 NOTE — Telephone Encounter (Signed)
Scheduled appt per 5/3 los.  Left a vm of the appt date and time

## 2020-02-03 NOTE — Telephone Encounter (Signed)
I left a message asking the patient to call and schedule Medicare AWV with Loma Sousa ([place]).  If patient calls back, please schedule Medicare Wellness Visit at next available opening. Last AWV 02/07/2018 [facility] ([place])

## 2020-02-04 ENCOUNTER — Other Ambulatory Visit: Payer: Self-pay | Admitting: Emergency Medicine

## 2020-02-04 DIAGNOSIS — R0602 Shortness of breath: Secondary | ICD-10-CM

## 2020-02-04 DIAGNOSIS — Z170 Estrogen receptor positive status [ER+]: Secondary | ICD-10-CM

## 2020-02-04 LAB — TYPE AND SCREEN
ABO/RH(D): B POS
Antibody Screen: NEGATIVE
Unit division: 0
Unit division: 0

## 2020-02-04 LAB — BPAM RBC
Blood Product Expiration Date: 202106032359
Blood Product Expiration Date: 202106032359
ISSUE DATE / TIME: 202105031320
ISSUE DATE / TIME: 202105040753
Unit Type and Rh: 7300
Unit Type and Rh: 7300

## 2020-02-05 ENCOUNTER — Ambulatory Visit (HOSPITAL_COMMUNITY)
Admission: RE | Admit: 2020-02-05 | Discharge: 2020-02-05 | Disposition: A | Discharge status: Home / Self Care | Payer: Medicare Other | Source: Ambulatory Visit | Attending: Nurse Practitioner | Admitting: Nurse Practitioner

## 2020-02-05 ENCOUNTER — Other Ambulatory Visit: Payer: Self-pay

## 2020-02-05 DIAGNOSIS — R0602 Shortness of breath: Secondary | ICD-10-CM | POA: Diagnosis not present

## 2020-02-05 NOTE — Progress Notes (Signed)
  Echocardiogram 2D Echocardiogram has been performed.  Randa Lynn Zimri Brennen 02/05/2020, 12:25 PM

## 2020-02-06 ENCOUNTER — Telehealth: Payer: Self-pay | Admitting: Primary Care

## 2020-02-06 DIAGNOSIS — R0602 Shortness of breath: Secondary | ICD-10-CM

## 2020-02-06 NOTE — Telephone Encounter (Signed)
ATC patient unable to reach LM to call back office (x1)  

## 2020-02-06 NOTE — Progress Notes (Signed)
[facility]   [phone] [phone]   Clinic Follow up Note   Patient Care Team: Marin Olp, MD as PCP - General (Family Medicine) Philemon Kingdom, MD as Consulting Physician (Internal Medicine) Jalene Mullet, MD as Consulting Physician (Ophthalmology) Erroll Luna, MD as Consulting Physician (General Surgery) Kyung Rudd, MD as Consulting Physician (Radiation Oncology) [name], MD as Consulting Physician (Hematology) Gardenia Phlegm, NP as Nurse Practitioner (Hematology and Oncology)  Date of Service:  02/09/2020  CHIEF COMPLAINT: Follow Up for Invasive Ductal Carcinoma of the left breast  SUMMARY OF ONCOLOGIC HISTORY: Oncology History Overview Note  Cancer Staging Malignant neoplasm of upper-outer quadrant of left breast in female, estrogen receptor positive ([place]) Staging form: Breast, AJCC 8th Edition - Clinical: Stage IIB (cT2, cN0, cM0, G3, ER: Positive, PR: Negative, HER2: Negative) - Signed by [name], MD on 11/23/2016 - Pathologic stage from 12/28/2016: Stage IIB (pT2, pN1a(sn), cM0, G3, ER: Positive, PR: Negative, HER2: Negative) - Signed by [name], MD on 01/02/2017     Malignant neoplasm of upper-outer quadrant of left breast in female, estrogen receptor positive ([place])  11/01/2016 Mammogram   Diagnostic mammogram and ultrasound of left breast and axilla showed a 3.1 x 2.1 x 1.4 cm (3.3 x 2.0 x 2.7 cm by ultrasound) mass in the upper outer quadrant of the anterior third of the left breast, associated with pleomorphic calcification. There is a 8 mm (1.8cm by [name]) prominent lymph node in the left axilla.   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   11/07/2016 Receptors her2   ER 80% positive, PR negative, HER-2 negative, Ki-67 90%   11/20/2016 Initial Diagnosis   Malignant neoplasm of upper-outer quadrant of left  breast in female, estrogen receptor positive ([place])   12/07/2016 Surgery   Left lumpectomy and left axillary sentinel lymph node sampling by Dr. Brantley Stage   12/07/2016 Pathology Results   pT2, pN1 Left Lumpectomy: Grade 3 IDC measuring 3.4 cm, carcinoma broadly present at the superior margin. Grade 3 DCIS. 1 out of 2 left axillary SLN positive for metastatic carcinoma   12/07/2016 Miscellaneous   Mammaprint showed high risk disease, basal type    12/28/2016 Surgery   Re-excision of the previously positive superior margin was negative for malignant cells.    01/04/2017 Surgery   Port inserted   01/18/2017 - 03/22/2017 Chemotherapy   Adjuvant Docetaxel 75 mg/m and Cytoxan 600 mg/m, every 21 days, for total of 4 cycles, with Neulasta on day 2.    04/23/2017 - 05/18/2017 Radiation Therapy   Radiation treatment dates:   04/23/17 - 05/18/17 Administered by Dr. Lisbeth Renshaw  Site/dose:    Left breast/ 42.5 Gy in 17 Fx Boost / 7.5 Gy in 3 Fx   06/12/2017 - 08/14/2019 Anti-estrogen oral therapy   Adjuvant letrozole 1 mg daily, plan for 5-7 years. D/c on 08/14/19 due to local recurrence of left axilla.     07/20/2017 Mammogram   IMPRESSION: No mammographic evidence of malignancy in either breast. 3.8 cm left breast postsurgical loculated seroma.   09/12/2017 Survivorship     09/19/2018 Imaging   Baseline DEXA 09/19/18 ASSESSMENT: The BMD measured at Femur Neck Left is 0.888 g/cm2 with a T-score of -1.1. This patient is considered osteopenic according to [facility]) criteria.   The scan quality is good. L-4 was excluded due to degenerative changes.  Site Region Measured Date Measured Age YA BMD Significant CHANGE T-score AP Spine  L1-L3      09/19/2018    [age]         -0.7    1.084 g/cm2   DualFemur Neck Left  09/19/2018    [age]         -1.1    0.888 g/cm2   DualFemur Total Mean 09/19/2018    77.2         -0.5    0.946 g/cm2 ASSESSMENT: The probability of a major  osteoporotic fracture is 4.5 % within the next ten years.   The probability of hip fracture is 0.8  % within the next 10 years.   06/25/2019 Mammogram   Diagnostic mammogram 06/25/19  IMPRESSION: 1.  Two morphologically abnormal lymph nodes in the left axilla with a lymph node in the lower left axilla measuring 2.4 x 2 x 2.6 cm.   2. Stable lumpectomy changes left breast with no findings of malignancy in either breast.   07/01/2019 Pathology Results   Diagnosis Lymph node, needle/core biopsy, left axilla - POORLY DIFFERENTIATED CARCINOMA CONSISTENT WITH BREAST PRIMARY. - NO LYMPHOID NODAL TISSUE IDENTIFIED. - SEE MICROSCOPIC DESCRIPTION. Microscopic Comment The carcinoma is consistent with grade III.  PROGNOSTIC INDICATORS Results: IMMUNOHISTOCHEMICAL AND MORPHOMETRIC ANALYSIS PERFORMED MANUALLY The tumor cells are NEGATIVE for Her2 (1+). Estrogen Receptor: 80%, POSITIVE, WEAK STAINING INTENSITY Progesterone Receptor: 0%, NEGATIVE Proliferation Marker Ki67: 80%   07/18/2019 Breast MRI   Breast MRI 07/18/19  IMPRESSION: 1. Two morphologically abnormal level 1 left axillary lymph nodes correlating with biopsy-proven malignancy. 2. No MRI evidence of malignancy in either breast. 3. Postsurgical changes of the left breast consistent with prior lumpectomy.   07/25/2019 PET scan   PET 07/25/19  IMPRESSION: 1. Hypermetabolic LEFT axillary mass consistent with metastatic breast cancer. 2. High LEFT axillary/posterior triangle (level 5) lymph node consistent with local metastasis. 3. No central mediastinal nodal metastasis. No suspicious pulmonary nodules. 4. No distant soft tissue metastasis or skeletal metastasis. 5. Calcified fibroid uterus   08/05/2019 Surgery   LEFT AXILLARY LYMPH NODE DISSECTION by Dr. Brantley Stage 08/05/19    08/05/2019 Pathology Results   FINAL MICROSCOPIC DIAGNOSIS: 08/05/19    A. LYMPH NODES, LEFT AXILLARY, DISSECTION:  - Metastatic carcinoma.  - See  comment.    08/2019 - 01/13/2020 Anti-estrogen oral therapy   Exemestane 87m starting 08/2019. Stopped with the start of chemo in 01/2020   09/01/2019 Genetic Testing   [employer] [facility] Gen[employer] Findings:  -RET amplification -CDK8 amplification -NOTCH1 deletion  -TJH41splice site 3740C>X  14/48/1856Imaging   CT Angio Chest   IMPRESSION: 1. No definitive pulmonary embolism. 2. Multiple new small bilateral pulmonary nodules consistent with metastatic disease. 3. Soft tissue density in the left axilla at the site of node dissection, probably representing scarring but I cannot exclude tumor recurrence. 4. New partially healed fracture of the posterior aspect of the right eighth rib.   12/04/2019 - 01/13/2020 Chemotherapy   Verzenio 1527mBID starting 12/04/19. Stopped a week before the start of chemo in 01/2020   01/07/2020 PET scan   IMPRESSION: 1. Marked worsening of disease with soft tissue recurrence in the left axilla, new and enlarging lymph nodes in the supraclavicular/high axillary region, new mediastinal adenopathy and new pulmonary pleural and parenchymal disease. 2. Metastatic disease to the right hepatic lobe. 3. Signs of bony metastasis. 4. New pleural effusions presumably malignant and associated with pleural nodularity.   01/20/2020 -  Chemotherapy   Abraxane on days 1, 8, 15 q28 days; started 01/20/20       CURRENT THERAPY:  Abraxane on days 1, 8, 15 q28 days; started 01/20/20  INTERVAL HISTORY:  Wendy [name] is here for a follow up of treatment. She presents to the clinic alone.  Her fatigue is slightly improved after blood transfusion last week.  She still had severe dyspnea on exertion, she is able to do very light housework at home, but has not gone out for shopping or walking due to the dyspnea on exertion.  She denies chest pain.  She tolerated chemotherapy last week very well, no noticeable side effects.  Her left arm has been swollen since her last surgery,  which is stable, no other new complaints.  Review of system otherwise negative.   MEDICAL HISTORY:  Past Medical History:  Diagnosis Date  . Abnormal breast finding 2018   per pt/ having  a lot drainage from left breast nipple  . Anemia   . Anxiety   . Breast cancer ([place]) 11/2016   left/  . CKD (chronic kidney disease)   . Depression   . GERD (gastroesophageal reflux disease)    TUMS as needed  . HTN (hypertension)    states BP has been high recently; has been on med. x 20 yr.  . Hyperlipidemia   . Hyperplastic colon polyp   . HYPERTENSION, BENIGN SYSTEMIC 11/29/2006   Lisinopril hctz 10-12.27m, amlodipine 167m coreg 2580mID, clonidine 0.3 mg BID.   . IMarland Kitchensulin dependent diabetes mellitus   . Personal history of chemotherapy    2018/finished 6 weeks of chemo in Sep 2018  . Personal history of radiation therapy    2018 left breast/finished radiation in Sept 2018 per pt.    SURGICAL HISTORY: Past Surgical History:  Procedure Laterality Date  . AXILLARY LYMPH NODE DISSECTION Left 08/05/2019   Procedure: LEFT AXILLARY LYMPH NODE DISSECTION;  Surgeon: CorErroll LunaD;  [facility];  Laterality: Left;  . BREAST LUMPECTOMY Left 05/05/2008  . BREAST LUMPECTOMY Left 12/07/2016   malignant  . BREAST LUMPECTOMY WITH RADIOACTIVE SEED AND SENTINEL LYMPH NODE BIOPSY Left 12/07/2016   Procedure: LEFT BREAST LUMPECTOMY WITH RADIOACTIVE SEED AND SENTINEL LYMPH NODE BIOPSY;  Surgeon: ThoErroll LunaD;  [facility];  Laterality: Left;  . IR FLUORO GUIDE PORT INSERTION RIGHT  01/04/2017  . IR US [place] VASC ACCESS RIGHT  01/04/2017  . PORT-A-CATH REMOVAL N/A 05/30/2017   Procedure: REMOVAL PORT-A-CATH;  Surgeon: CorErroll LunaD;  [facility];  Laterality: N/A;  . RE-EXCISION OF BREAST LUMPECTOMY Left 12/28/2016   Procedure: RE-EXCISION OF BREAST LUMPECTOMY;  Surgeon:  ThoErroll LunaD;  [facility];  Laterality: Left;    I have reviewed the social history and family history with the patient and they are unchanged from previous note.  ALLERGIES:  has No Known Allergies.  MEDICATIONS:  Current Outpatient Medications  Medication Sig Dispense Refill  . albuterol (VENTOLIN HFA) 108 (90 Base) MCG/ACT inhaler Inhale 2 puffs into the lungs every 6 (six) hours as needed for wheezing or shortness of breath. 18 g 2  . amLODipine (NORVASC) 10 MG tablet TAKE 1 TABLET BY MOUTH EVERY DAY (Patient taking differently: Take 10 mg by mouth daily. ) 90 tablet 1  . aspirin 81 MG tablet Take 81 mg by mouth daily.      .Marland Kitchen  B-D INS SYRINGE 0.5CC/31GX5/16 31G X 5/16" 0.5 ML MISC USE AS DIRECTED 4 TIMES A DAY 300 each 1  . busPIRone (BUSPAR) 10 MG tablet Take 1 tablet (10 mg total) by mouth 3 (three) times daily. 270 tablet 1  . carvedilol (COREG) 25 MG tablet Take 1 tablet (25 mg total) by mouth 2 (two) times daily with a meal. 180 tablet 3  . cloNIDine (CATAPRES) 0.3 MG tablet TAKE 1 TABLET (0.3 MG TOTAL) BY MOUTH 2 (TWO) TIMES DAILY. 180 tablet 1  . famotidine (PEPCID) 20 MG tablet Take 1 tablet (20 mg total) by mouth daily. 30 tablet 1  . fluticasone (FLONASE) 50 MCG/ACT nasal spray Place 1 spray into both nostrils daily. 16 g 2  . fluticasone furoate-vilanterol (BREO ELLIPTA) 200-25 MCG/INH AEPB Inhale 1 puff into the lungs daily. 60 each 3  . fluticasone furoate-vilanterol (BREO ELLIPTA) 200-25 MCG/INH AEPB Inhale 1 puff into the lungs daily. 2 each 0  . HYDROcodone-acetaminophen (NORCO/VICODIN) 5-325 MG tablet Take 1 tablet by mouth every 8 (eight) hours as needed for moderate pain. 15 tablet 0  . Insulin Glargine (LANTUS SOLOSTAR) 100 UNIT/ML Solostar Pen Inject 28 units into the skin in am (Patient taking differently: Inject 28 Units into the skin every morning. ) 10 pen 3  . Insulin Pen Needle 31G X 5 MM MISC Use pen needles for insulin injection  daily 100 each 3  . insulin regular (NOVOLIN R) 100 units/mL injection Inject 0.14-0.18 mLs (14-18 Units total) into the skin 3 (three) times daily before meals. Relion. Please provide insulin syringes needed (Patient taking differently: Inject 10-16 Units into the skin See admin instructions. 16 units in the morning and 10 units at night) 20 mL 11  . Insulin Syringe-Needle U-100 (B-D INS SYRINGE 0.5CC/30GX1/2") 30G X 1/2" 0.5 ML MISC USE AS DIRECTED 4 TIMES A DAY 300 each 3  . lidocaine-prilocaine (EMLA) cream Apply to affected area once 30 g 3  . lisinopril-hydrochlorothiazide (ZESTORETIC) 20-12.5 MG tablet Take 1 tablet by mouth daily. 90 tablet 3  . lovastatin (MEVACOR) 40 MG tablet TAKE 1 TABLET BY MOUTH EVERYDAY AT BEDTIME (Patient taking differently: Take 40 mg by mouth at bedtime. ) 90 tablet 3  . mirtazapine (REMERON) 7.5 MG tablet Take 1 tablet (7.5 mg total) by mouth at bedtime. 30 tablet 0  . montelukast (SINGULAIR) 10 MG tablet Take 1 tablet (10 mg total) by mouth at bedtime. 30 tablet 3  . ondansetron (ZOFRAN) 8 MG tablet Take 1 tablet (8 mg total) by mouth 2 (two) times daily as needed (Nausea or vomiting). 30 tablet 1  . OVER THE COUNTER MEDICATION Take 1 tablet by mouth daily as needed. Stool softner for constipation, also drinks warm prune juice prn    . polyethylene glycol powder (GLYCOLAX/MIRALAX) 17 GM/SCOOP powder Take 17 g by mouth daily. (Patient taking differently: Take 17 g by mouth as needed for mild constipation or moderate constipation. ) 1 g 0  . predniSONE (DELTASONE) 10 MG tablet Take 2 tabs x 5 days 10 tablet 0  . spironolactone (ALDACTONE) 25 MG tablet Take 0.5 tablets (12.5 mg total) by mouth daily. 15 tablet 3  . traMADol (ULTRAM) 50 MG tablet TAKE 1 TABLET BY MOUTH EVERY 6 HOURS AS NEEDED FOR MODERATE/SEVERE PAIN (NECK AND SHOULDER PAIN). (Patient taking differently: Take 50 mg by mouth every 6 (six) hours as needed for moderate pain or severe pain (neck and shoulder  pain). ) 20 tablet 0   No current  facility-administered medications for this visit.    PHYSICAL EXAMINATION: ECOG PERFORMANCE STATUS: 3 - Symptomatic, >50% confined to bed  Vitals:   02/09/20 0810  BP: (!) 120/50  Pulse: 62  Resp: 18  Temp: 98 F (36.7 C)  SpO2: 100%   Filed Weights   02/09/20 0810  Weight: 202 lb 6.4 oz (91.8 kg)    GENERAL:alert, no distress and comfortable SKIN: skin color, texture, turgor are normal, no rashes or significant lesions EYES: normal, Conjunctiva are pink and non-injected, sclera clear NECK: supple, thyroid normal size, non-tender, without nodularity LYMPH:  no palpable lymphadenopathy in the cervical, axillary  LUNGS: clear to auscultation and percussion with normal breathing effort, slightly decreased breath sounds in the left lung base  HEART: regular rate & rhythm and no murmurs and no lower extremity edema ABDOMEN:abdomen soft, non-tender and normal bowel sounds Musculoskeletal:no cyanosis of digits and no clubbing, (+) significant lymphedema of left arm and hand NEURO: alert & oriented x 3 with fluent speech, no focal motor/sensory deficits  LABORATORY DATA:  I have reviewed the data as listed CBC Latest Ref Rng & Units 02/09/2020 02/02/2020 01/29/2020  WBC 4.0 - 10.5 K/uL 4.1 3.8(L) 3.2(L)  Hemoglobin 12.0 - 15.0 g/dL 10.5(L) 7.7(L) 9.0(L)  Hematocrit 36.0 - 46.0 % 30.7(L) 22.8(L) 25.9(L)  Platelets 150 - 400 K/uL 266 296 315     CMP Latest Ref Rng & Units 02/09/2020 02/02/2020 01/29/2020  Glucose 70 - 99 mg/dL 210(H) 282(H) 103(H)  BUN 8 - 23 mg/dL 17 29(H) 31(H)  Creatinine 0.44 - 1.00 mg/dL 1.57(H) 1.89(H) 2.55(H)  Sodium 135 - 145 mmol/L 141 138 144  Potassium 3.5 - 5.1 mmol/L 3.9 4.3 3.7  Chloride 98 - 111 mmol/L 107 107 110  CO2 22 - 32 mmol/L 22 21(L) 22  Calcium 8.9 - 10.3 mg/dL 8.6(L) 8.5(L) 9.1  Total Protein 6.5 - 8.1 g/dL 6.4(L) 6.6 7.5  Total Bilirubin 0.3 - 1.2 mg/dL 0.4 0.3 0.3  Alkaline Phos 38 - 126 U/L 79 81 91    AST 15 - 41 U/L 10(L) 9(L) 13(L)  ALT 0 - 44 U/L <6 7 <6      RADIOGRAPHIC STUDIES: I have personally reviewed the radiological images as listed and agreed with the findings in the report. No results found.   ASSESSMENT & PLAN:  Wendy [name] is a 79 y.o. female with    1. Malignant neoplasm of upper-outer quadrant of left breast, Invasive Ductal Carcinoma, pT2pN1M0, stage IIB, ER+/PR-/HER2-, G3, mammaprint high risk, axillarynodesrecurrence in 06/2019, lung nodules in 10/2019, metastatic to lung, bone, LNs, liver in 01/2020 -She was initiallydiagnosed in 11/2016. She was treated with left lumpectomy and SLN sampling,re-excision surgery, Adjuvant TC for 4 cycles andadjuvantradiation.  -She was on adjuvant Letrozole from 06/2017-08/14/19 -Unfortunately she had local recurrence in left axilla and underwent Left axillary lymph node dissection by Dr. Brantley Stage on 08/05/19.She had complete resection. -I recommended she continue Exemestane and addedoral biological agent Verzenio 143m BID starting3/4/21.  -Her PET images from 01/07/20 showed wide spread disease with soft tissue recurrence in the left axilla, enlarging LNs in chest, new lung pleural and parenchymal disease, metastatic disease of the right liver lobe and signs of bone metastasis. There is also new pleural effusions presumably malignant and associated with pleural nodularity.  -Based on the scan findings, I do not think biopsy is necessary. At this stage her cancer is no longer curable but still treatable.  -Due to the rapid disease progression through AI and  CDK4/6 inhibitor, and multiple visceral organ involvement, I started her on systemic chemo with single agent Abraxane 41m/m2 weekly, 3 weeks on/1 week off on 01/20/20.  -Her FO results which showed RET, CDK8, NOTCH1, TP53 mutations.No targetable mutation for now.  -She has developed significant dyspnea on exertion lately, chemo was held for 1 week for work-up, she also required  blood transfusion. -Lab reviewed, adequate for treatment, will proceed cycle 1 day 15 of chemotherapy today. -Return to clinic in 2 weeks to start cycle 2  2. Dyspnea on exertion, lung metastasis, left pleural effusion, ? Asthma  -She has been having Dry Cough and SOB for the past few months and has progressed recently. -Patient denies smoking before but notes she has h/o of Tobacco snuff and h/o asthma.  -She has receivedboth herCOVID19 vaccinations. -She is being followed by PCP and pulmonologist Dr. MVaughan Browner Her breathing approved on Breo given by her PCP. -Her recent VQ scan was negative for PE -I reviewed her echocardiogram from last week, which showed normal EF, grade 1 diastolic dysfunction, trace pericardial effusion, and a moderate left pleural effusion -I will arrange ultrasound-guided left thoracentesis, for diagnosis and symptom relief. -She has a follow-up appointment with Dr. MVaughan Browneron May 26, will copy note   3. Low appetite, nausea, B/l abdominal pain, Diarrhea/constipation -She notes she has nausea which is controlled with antiemetics -She has lowered appetite from her bloating, early satiety, along with occasional b/l abdominal pain and occasional diarrhea/constipation.  -She has metamucil and prune juice as needed which helps her bowels.  -I encouraged her to use her Glucerna to help her maintain weight. I started her on Mirtazapine 7.582mfor her to take nightly (01/08/20). If not enough we can titrate up or I can call in Marinol. I recommend she hold Trazodone for now given Mirtazapine can cause drowsiness.   4. Left arm numbness/tinglingandLeft arm lymphedema, secondary to surgery. -secondary to malignant left axillary adenopathy, s/p ALND in 08/2019 -She still has left armand handlymphedema. I discussed her edema can improve but may have residual edema permanently. -She is currently doing PT 3 times a week, continue.   5. HTN, DM, CKD III -F/u with PCPand  endocrinologist.  -DM is not well controlled. I encouraged her to better control her DM as this can effects her kidney function.   6. Mild anemia -Iron and TIBC, B12, MMA normal -Anemia got worse since she started chemotherapy, and required blood transfusion  7. Osteopenia  -09/2018 DEXA shows osteopeniawith lowest T-score -1.1 -Will monitor onAI. Next DEXA in 09/2020  8. Financial and Social Support  -She gets help with her son but he is looking to return to working. I discussed there [facility]. She is open.  -Per request, I will see if she is eligible for home aid.   9. Goal of care discussion  -we reviewed that due to her rapid disease progression, her cancer is not curable with chemo alone at this point. The goal is palliative, to prolong her life and improve quality of life. She understands and agrees. -The patient understands the goal of care is palliative. -I recommend DNR/DNI, she will think about it    Plan: -Labs reviewed, adequate for treatment, will proceed chemotherapy today -Left thoracentesis by IR for diagnosis and symptom relief -I will copy Dr. MaVaughan Browner-Patient will return in 2 weeks for cycle 2 chemotherapy    No problem-specific Assessment & Plan notes found for this encounter.  Orders Placed This Encounter  Procedures  . US Thoracentesis Asp Pleural space w/IMG guide    Standing Status:   Future    Standing Expiration Date:   02/08/2021    Scheduling Instructions:     Left thoracentesis for diagnosis and symptoms relieve    Order Specific Question:   Are labs required for specimen collection?    Answer:   Yes    Order Specific Question:   Lab orders requested (DO NOT place separate lab orders, these will be automatically ordered during procedure specimen collection):    Answer:   Body Fluid Cell Count With Differential    Order Specific Question:   Lab orders requested (DO NOT place separate lab orders,  these will be automatically ordered during procedure specimen collection):    Answer:   Cytology - Non Pap    Order Specific Question:   Lab orders requested (DO NOT place separate lab orders, these will be automatically ordered during procedure specimen collection):    Answer:   Glucose, Body Fluid    Order Specific Question:   Lab orders requested (DO NOT place separate lab orders, these will be automatically ordered during procedure specimen collection):    Answer:   Protein, Pleural Or Peritoneal Fluid    Order Specific Question:   Lab orders requested (DO NOT place separate lab orders, these will be automatically ordered during procedure specimen collection):    Answer:   Body Fluid Culture    Order Specific Question:   Reason for Exam (SYMPTOM  OR DIAGNOSIS REQUIRED)    Answer:   dyspnea on exertion    Order Specific Question:   Preferred imaging location?    Answer:   [facility] [place]   All questions were answered. The patient knows to call the clinic with any problems, questions or concerns. No barriers to learning was detected. The total time spent in the appointment was 30 minutes.     [name], MD 02/09/2020   I, Joslyn Devon, am acting as scribe for [name], MD.   I have reviewed the above documentation for accuracy and completeness, and I agree with the above.

## 2020-02-09 ENCOUNTER — Inpatient Hospital Stay (HOSPITAL_BASED_OUTPATIENT_CLINIC_OR_DEPARTMENT_OTHER): Payer: Medicare Other | Admitting: Hematology

## 2020-02-09 ENCOUNTER — Ambulatory Visit: Payer: Medicare Other

## 2020-02-09 ENCOUNTER — Ambulatory Visit: Payer: Medicare Other | Admitting: Family Medicine

## 2020-02-09 ENCOUNTER — Inpatient Hospital Stay: Payer: Medicare Other

## 2020-02-09 ENCOUNTER — Encounter: Payer: Self-pay | Admitting: Hematology

## 2020-02-09 ENCOUNTER — Other Ambulatory Visit: Payer: Self-pay

## 2020-02-09 VITALS — BP 120/50 | HR 62 | Temp 98.0°F | Resp 18 | Ht 62.0 in | Wt 202.4 lb

## 2020-02-09 DIAGNOSIS — Z170 Estrogen receptor positive status [ER+]: Secondary | ICD-10-CM

## 2020-02-09 DIAGNOSIS — C50412 Malignant neoplasm of upper-outer quadrant of left female breast: Secondary | ICD-10-CM

## 2020-02-09 DIAGNOSIS — N183 Chronic kidney disease, stage 3 unspecified: Secondary | ICD-10-CM

## 2020-02-09 DIAGNOSIS — C7800 Secondary malignant neoplasm of unspecified lung: Secondary | ICD-10-CM | POA: Diagnosis not present

## 2020-02-09 DIAGNOSIS — Z20822 Contact with and (suspected) exposure to covid-19: Secondary | ICD-10-CM | POA: Diagnosis not present

## 2020-02-09 DIAGNOSIS — D649 Anemia, unspecified: Secondary | ICD-10-CM | POA: Diagnosis not present

## 2020-02-09 DIAGNOSIS — Z5111 Encounter for antineoplastic chemotherapy: Secondary | ICD-10-CM | POA: Diagnosis not present

## 2020-02-09 DIAGNOSIS — Z7189 Other specified counseling: Secondary | ICD-10-CM

## 2020-02-09 LAB — CBC WITH DIFFERENTIAL/PLATELET
Abs Immature Granulocytes: 0.04 10*3/uL (ref 0.00–0.07)
Basophils Absolute: 0 10*3/uL (ref 0.0–0.1)
Basophils Relative: 1 %
Eosinophils Absolute: 0.1 10*3/uL (ref 0.0–0.5)
Eosinophils Relative: 2 %
HCT: 30.7 % — ABNORMAL LOW (ref 36.0–46.0)
Hemoglobin: 10.5 g/dL — ABNORMAL LOW (ref 12.0–15.0)
Immature Granulocytes: 1 %
Lymphocytes Relative: 27 %
Lymphs Abs: 1.1 10*3/uL (ref 0.7–4.0)
MCH: 30 pg (ref 26.0–34.0)
MCHC: 34.2 g/dL (ref 30.0–36.0)
MCV: 87.7 fL (ref 80.0–100.0)
Monocytes Absolute: 0.4 10*3/uL (ref 0.1–1.0)
Monocytes Relative: 9 %
Neutro Abs: 2.5 10*3/uL (ref 1.7–7.7)
Neutrophils Relative %: 60 %
Platelets: 266 10*3/uL (ref 150–400)
RBC: 3.5 MIL/uL — ABNORMAL LOW (ref 3.87–5.11)
RDW: 20.8 % — ABNORMAL HIGH (ref 11.5–15.5)
WBC: 4.1 10*3/uL (ref 4.0–10.5)
nRBC: 0 % (ref 0.0–0.2)

## 2020-02-09 LAB — COMPREHENSIVE METABOLIC PANEL
ALT: <6 U/L (ref 0–44)
AST: 10 U/L — ABNORMAL LOW (ref 15–41)
Albumin: 3.3 g/dL — ABNORMAL LOW (ref 3.5–5.0)
Alkaline Phosphatase: 79 U/L (ref 38–126)
Anion gap: 12 (ref 5–15)
BUN: 17 mg/dL (ref 8–23)
CO2: 22 mmol/L (ref 22–32)
Calcium: 8.6 mg/dL — ABNORMAL LOW (ref 8.9–10.3)
Chloride: 107 mmol/L (ref 98–111)
Creatinine, Ser: 1.57 mg/dL — ABNORMAL HIGH (ref 0.44–1.00)
GFR calc Af Amer: 36 mL/min — ABNORMAL LOW (ref >60)
GFR calc non Af Amer: 31 mL/min — ABNORMAL LOW (ref >60)
Glucose, Bld: 210 mg/dL — ABNORMAL HIGH (ref 70–99)
Potassium: 3.9 mmol/L (ref 3.5–5.1)
Sodium: 141 mmol/L (ref 135–145)
Total Bilirubin: 0.4 mg/dL (ref 0.3–1.2)
Total Protein: 6.4 g/dL — ABNORMAL LOW (ref 6.5–8.1)

## 2020-02-09 MED ORDER — PROCHLORPERAZINE MALEATE 10 MG PO TABS
10.0000 mg | ORAL_TABLET | Freq: Once | ORAL | Status: AC
  Administered 2020-02-09: 10 mg via ORAL

## 2020-02-09 MED ORDER — PACLITAXEL PROTEIN-BOUND CHEMO INJECTION 100 MG
80.0000 mg/m2 | Freq: Once | INTRAVENOUS | Status: AC
  Administered 2020-02-09: 150 mg via INTRAVENOUS
  Filled 2020-02-09: qty 30

## 2020-02-09 MED ORDER — PROCHLORPERAZINE MALEATE 10 MG PO TABS
ORAL_TABLET | ORAL | Status: AC
  Filled 2020-02-09: qty 1

## 2020-02-09 MED ORDER — SODIUM CHLORIDE 0.9 % IV SOLN
Freq: Once | INTRAVENOUS | Status: AC
  Filled 2020-02-09: qty 250

## 2020-02-09 MED ORDER — BREO ELLIPTA 200-25 MCG/INH IN AEPB: 1.0000 | INHALATION_SPRAY | Freq: Every day | RESPIRATORY_TRACT | 3 refills | Status: DC

## 2020-02-09 MED ORDER — BREO ELLIPTA 200-25 MCG/INH IN AEPB
1.0000 | INHALATION_SPRAY | Freq: Every day | RESPIRATORY_TRACT | 0 refills | Status: DC
Start: 2020-02-09 — End: 2020-02-25

## 2020-02-09 NOTE — Progress Notes (Signed)
Per Dr. Burr Medico, okay for patient to receive treatment today with creatine 1.57.

## 2020-02-09 NOTE — Telephone Encounter (Signed)
Yes she did bring in paper work from what I remember, I think Jonelle Sidle may have given to pharmacy or set her up with a visit not sure?

## 2020-02-09 NOTE — Telephone Encounter (Signed)
Patient is returning phone call. Patient phone number is [phone].

## 2020-02-09 NOTE — Telephone Encounter (Signed)
I have the paperwork, they need a signed Rx for Breo. I will place on Beth desk to sign tomorrow.

## 2020-02-09 NOTE — Patient Instructions (Signed)
[facility] Discharge Instructions for Patients Receiving Chemotherapy  Today you received the following chemotherapy agents: Abraxane  To help prevent nausea and vomiting after your treatment, we encourage you to take your nausea medication as directed.   If you develop nausea and vomiting that is not controlled by your nausea medication, call the clinic.   BELOW ARE SYMPTOMS THAT SHOULD BE REPORTED IMMEDIATELY:  *FEVER GREATER THAN 100.5 F  *CHILLS WITH OR WITHOUT FEVER  NAUSEA AND VOMITING THAT IS NOT CONTROLLED WITH YOUR NAUSEA MEDICATION  *UNUSUAL SHORTNESS OF BREATH  *UNUSUAL BRUISING OR BLEEDING  TENDERNESS IN MOUTH AND THROAT WITH OR WITHOUT PRESENCE OF ULCERS  *URINARY PROBLEMS  *BOWEL PROBLEMS  UNUSUAL RASH Items with * indicate a potential emergency and should be followed up as soon as possible.  Feel free to call the clinic should you have any questions or concerns. The clinic phone number is [phone].  Please show the [facility] at check-in to the Emergency Department and triage nurse.   Blood Transfusion, Adult, Care After This sheet gives you information about how to care for yourself after your procedure. Your doctor may also give you more specific instructions. If you have problems or questions, contact your doctor. What can I expect after the procedure? After the procedure, it is common to have:  Bruising and soreness at the IV site.  A fever or chills on the day of the procedure. This may be your body's response to the new blood cells received.  A headache. Follow these instructions at home: Insertion site care      Follow instructions from your doctor about how to take care of your insertion site. This is where an IV tube was put into your vein. Make sure you: ? Wash your hands with soap and water before and after you change your bandage (dressing). If you cannot use soap and water, use hand sanitizer. ? Change your  bandage as told by your doctor.  Check your insertion site every day for signs of infection. Check for: ? Redness, swelling, or pain. ? Bleeding from the site. ? Warmth. ? Pus or a bad smell. General instructions  Take over-the-counter and prescription medicines only as told by your doctor.  Rest as told by your doctor.  Go back to your normal activities as told by your doctor.  Keep all follow-up visits as told by your doctor. This is important. Contact a doctor if:  You have itching or red, swollen areas of skin (hives).  You feel worried or nervous (anxious).  You feel weak after doing your normal activities.  You have redness, swelling, warmth, or pain around the insertion site.  You have blood coming from the insertion site, and the blood does not stop with pressure.  You have pus or a bad smell coming from the insertion site. Get help right away if:  You have signs of a serious reaction. This may be coming from an allergy or the body's defense system (immune system). Signs include: ? Trouble breathing or shortness of breath. ? Swelling of the face or feeling warm (flushed). ? Fever or chills. ? Head, chest, or back pain. ? Dark pee (urine) or blood in the pee. ? Widespread rash. ? Fast heartbeat. ? Feeling dizzy or light-headed. You may receive your blood transfusion in an outpatient setting. If so, you will be told whom to contact to report any reactions. These symptoms may be an emergency. Do not wait to see  if the symptoms will go away. Get medical help right away. Call your local emergency services (911 in the [place] Do not drive yourself to the hospital. Summary  Bruising and soreness at the IV site are common.  Check your insertion site every day for signs of infection.  Rest as told by your doctor. Go back to your normal activities as told by your doctor.  Get help right away if you have signs of a serious reaction. This information is not intended to  replace advice given to you by your health care provider. Make sure you discuss any questions you have with your health care provider. Document Revised: 03/13/2019 Document Reviewed: 03/13/2019 Elsevier Patient Education  [place].

## 2020-02-09 NOTE — Telephone Encounter (Signed)
Called and spoke with patient. They state that she was told about paperwork for her inhaler. She has returned the proper documentation and signed with her proof of income. But still hasn't heard anything.   I told patient we could get her a 2 week sample of breo 200. Her son will pick up.   I will also route this to [facility] and Beth to see if anything was received by patient and when things were faxed out.

## 2020-02-09 NOTE — Addendum Note (Signed)
Addended by: Jannette Spanner on: 02/09/2020 03:30 PM   Modules accepted: Orders

## 2020-02-10 ENCOUNTER — Telehealth: Payer: Self-pay

## 2020-02-10 NOTE — Telephone Encounter (Signed)
Patient cancel 2 month follow for 02/09/20 patient would like to r/s appt. Next available appt 03/05/20. Patient would like to be seen sooner. Can I schedule appt in SDA slot ?

## 2020-02-10 NOTE — Telephone Encounter (Signed)
Yes, that is fine. 

## 2020-02-13 ENCOUNTER — Ambulatory Visit (HOSPITAL_COMMUNITY)
Admission: RE | Admit: 2020-02-13 | Discharge: 2020-02-13 | Disposition: A | Discharge status: Home / Self Care | Payer: Medicare Other | Source: Ambulatory Visit | Attending: Hematology | Admitting: Hematology

## 2020-02-13 DIAGNOSIS — Z01812 Encounter for preprocedural laboratory examination: Secondary | ICD-10-CM | POA: Insufficient documentation

## 2020-02-13 DIAGNOSIS — Z20822 Contact with and (suspected) exposure to covid-19: Secondary | ICD-10-CM | POA: Diagnosis not present

## 2020-02-13 LAB — SARS CORONAVIRUS 2 (TAT 6-24 HRS): SARS Coronavirus 2: NEGATIVE

## 2020-02-16 ENCOUNTER — Other Ambulatory Visit: Payer: Self-pay | Admitting: Family Medicine

## 2020-02-16 ENCOUNTER — Telehealth: Payer: Self-pay

## 2020-02-16 ENCOUNTER — Ambulatory Visit (HOSPITAL_COMMUNITY): Admission: RE | Admit: 2020-02-16 | Payer: Medicare Other | Source: Ambulatory Visit

## 2020-02-16 DIAGNOSIS — I10 Essential (primary) hypertension: Secondary | ICD-10-CM

## 2020-02-16 NOTE — Telephone Encounter (Signed)
Ms Jone's daughter in law Georgina Quint stating that Ms Rud missed her thoracentesis appt today. I called [facility] department requesting the appt be rescheduled. I spoke with Ms Stantz.  I told her the ultrasound department will be calling to reschedule her appt.  I told her if her breathing becomes worse to go to the ED.  She verbalized understanding.  She stated it is ok to speak with her daughter in law [place].  I spoke with Larene Jasson Siegmann and let her know the plan.  She verbalized understanding.

## 2020-02-16 NOTE — Patient Instructions (Addendum)
-  Stop lisinopril hydrochlorothiazide 20-12.5 mg -Start lisinopril 20 mg alone -Start Lasix 20 mg back. -Follow-up with me in 2 or 3 weeks so we can see how you are doing with these changes -I am hoping this helps with your swelling.  Since you have been off the spironolactone for 2 weeks let us not restart quite yet-we will reconsider when we see you back  -We can stop aspirin since you have never had a heart attack or stroke  -encouraged follow up with Dr. Cruzita Lederer. If she doesn't have this scheduled by follow up we may get an a1c poc at that time vs with bloodwork if we have to draw blood  I still think counseling may help you. Consider Please call [phone] to schedule a visit with [facility] -Trey Paula is an excellent counselor who is based out of our clinic  Recommended follow up: Return in about 3 weeks (around 03/09/2020) for follow up- or sooner if needed.

## 2020-02-17 ENCOUNTER — Encounter: Payer: Self-pay | Admitting: Family Medicine

## 2020-02-17 ENCOUNTER — Ambulatory Visit (INDEPENDENT_AMBULATORY_CARE_PROVIDER_SITE_OTHER): Payer: Medicare Other | Admitting: Family Medicine

## 2020-02-17 ENCOUNTER — Other Ambulatory Visit: Payer: Self-pay

## 2020-02-17 VITALS — BP 136/60 | HR 70 | Temp 98.0°F | Ht 62.0 in | Wt 199.0 lb

## 2020-02-17 DIAGNOSIS — E1159 Type 2 diabetes mellitus with other circulatory complications: Secondary | ICD-10-CM | POA: Diagnosis not present

## 2020-02-17 DIAGNOSIS — E1169 Type 2 diabetes mellitus with other specified complication: Secondary | ICD-10-CM

## 2020-02-17 DIAGNOSIS — I10 Essential (primary) hypertension: Secondary | ICD-10-CM

## 2020-02-17 DIAGNOSIS — N183 Chronic kidney disease, stage 3 unspecified: Secondary | ICD-10-CM | POA: Diagnosis not present

## 2020-02-17 DIAGNOSIS — R600 Localized edema: Secondary | ICD-10-CM

## 2020-02-17 DIAGNOSIS — E785 Hyperlipidemia, unspecified: Secondary | ICD-10-CM

## 2020-02-17 MED ORDER — FUROSEMIDE 20 MG PO TABS: 20.0000 mg | ORAL_TABLET | Freq: Every day | ORAL | 3 refills | Status: DC

## 2020-02-17 MED ORDER — LISINOPRIL 20 MG PO TABS: 20.0000 mg | ORAL_TABLET | Freq: Every day | ORAL | 3 refills | Status: DC

## 2020-02-17 NOTE — Progress Notes (Signed)
Phone [phone] In person visit   Subjective:   Wendy [name] is a 79 y.o. year old very pleasant female patient who presents for/with See problem oriented charting Chief Complaint  Patient presents with  . Hypertension  . Hyperlipidemia  . Diabetes  . Depression  . Shortness of Breath    This visit occurred during the SARS-CoV-2 public health emergency.  Safety protocols were in place, including screening questions prior to the visit, additional usage of staff PPE, and extensive cleaning of exam room while observing appropriate contact time as indicated for disinfecting solutions.   Past Medical History-  Patient Active Problem List   Diagnosis Date Noted  . Shortness of breath 03/14/2017    Priority: High  . Malignant neoplasm of upper-outer quadrant of left breast in female, estrogen receptor positive ([place]) 11/20/2016    Priority: High  . 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 Diastolic dysfunction 20/35/5974    Priority: High  . Lymphedema of left arm 09/03/2019    Priority: Medium  . 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[place]) 12/05/2018    Priority: Medium  . Rectal bleeding 01/17/2017    Priority: Medium  . CKD (chronic kidney disease) stage 3, GFR 30-59 ml/min ([facility]) 03/17/2016    Priority: Medium  . TOBACCO USE 04/13/2008    Priority: Medium  . Hyperlipidemia associated with type 2 diabetes mellitus ([place]) 11/29/2006    Priority: Medium  . OBESITY, BMI 30-35 11/29/2006    Priority: Medium  . Hypertension associated with diabetes ([place]) 11/29/2006    Priority: Medium  . Fatigue 03/14/2017    Priority: Low  . Edema 03/14/2017    Priority: Low  . Port catheter in place 01/18/2017    Priority: Low  . Constipation 01/17/2017    Priority: Low  . Seasonal allergies 12/01/2013    Priority: Low  . Moderate persistent asthma  01/31/2020  . Allergic rhinitis 01/31/2020  . GERD (gastroesophageal reflux disease) 01/31/2020  . Goals of care, counseling/discussion 01/08/2020  . Grief 05/10/2018  . Anemia 01/09/2018    Medications- reviewed and updated Current Outpatient Medications  Medication Sig Dispense Refill  . albuterol (VENTOLIN HFA) 108 (90 Base) MCG/ACT inhaler Inhale 2 puffs into the lungs every 6 (six) hours as needed for wheezing or shortness of breath. 18 g 2  . amLODipine (NORVASC) 10 MG tablet TAKE 1 TABLET BY MOUTH EVERY DAY (Patient taking differently: Take 10 mg by mouth daily. ) 90 tablet 1  . B-D INS SYRINGE 0.5CC/31GX5/16 31G X 5/16" 0.5 ML MISC USE AS DIRECTED 4 TIMES A DAY 300 each 1  . carvedilol (COREG) 25 MG tablet Take 1 tablet (25 mg total) by mouth 2 (two) times daily with a meal. 180 tablet 3  . cloNIDine (CATAPRES) 0.3 MG tablet TAKE 1 TABLET (0.3 MG TOTAL) BY MOUTH 2 (TWO) TIMES DAILY. 180 tablet 1  . famotidine (PEPCID) 20 MG tablet Take 1 tablet (20 mg total) by mouth daily. 30 tablet 1  . fluticasone (FLONASE) 50 MCG/ACT nasal spray Place 1 spray into both nostrils daily. 16 g 2  . fluticasone furoate-vilanterol (BREO ELLIPTA) 200-25 MCG/INH AEPB Inhale 1 puff into the lungs daily. 1 each 0  . fluticasone furoate-vilanterol (BREO ELLIPTA) 200-25 MCG/INH AEPB Inhale 1 puff into the lungs daily. 3 each 3  . HYDROcodone-acetaminophen (NORCO/VICODIN) 5-325 MG tablet Take 1 tablet by mouth  every 8 (eight) hours as needed for moderate pain. 15 tablet 0  . Insulin Glargine (LANTUS SOLOSTAR) 100 UNIT/ML Solostar Pen Inject 28 units into the skin in am (Patient taking differently: Inject 28 Units into the skin every morning. ) 10 pen 3  . Insulin Pen Needle 31G X 5 MM MISC Use pen needles for insulin injection daily 100 each 3  . insulin regular (NOVOLIN R) 100 units/mL injection Inject 0.14-0.18 mLs (14-18 Units total) into the skin 3 (three) times daily before meals. [place]. Please provide  insulin syringes needed (Patient taking differently: Inject 10-16 Units into the skin See admin instructions. 16 units in the morning and 10 units at night) 20 mL 11  . Insulin Syringe-Needle U-100 (B-D INS SYRINGE 0.5CC/30GX1/2") 30G X 1/2" 0.5 ML MISC USE AS DIRECTED 4 TIMES A DAY 300 each 3  . lovastatin (MEVACOR) 40 MG tablet TAKE 1 TABLET BY MOUTH EVERYDAY AT BEDTIME (Patient taking differently: Take 40 mg by mouth at bedtime. ) 90 tablet 3  . montelukast (SINGULAIR) 10 MG tablet Take 1 tablet (10 mg total) by mouth at bedtime. 30 tablet 3  . ondansetron (ZOFRAN) 8 MG tablet Take 1 tablet (8 mg total) by mouth 2 (two) times daily as needed (Nausea or vomiting). 30 tablet 1  . OVER THE COUNTER MEDICATION Take 1 tablet by mouth daily as needed. Stool softner for constipation, also drinks warm prune juice prn    . polyethylene glycol powder (GLYCOLAX/MIRALAX) 17 GM/SCOOP powder Take 17 g by mouth daily. (Patient taking differently: Take 17 g by mouth as needed for mild constipation or moderate constipation. ) 1 g 0  . traMADol (ULTRAM) 50 MG tablet TAKE 1 TABLET BY MOUTH EVERY 6 HOURS AS NEEDED FOR MODERATE/SEVERE PAIN (NECK AND SHOULDER PAIN). (Patient taking differently: Take 50 mg by mouth every 6 (six) hours as needed for moderate pain or severe pain (neck and shoulder pain). ) 20 tablet 0  . busPIRone (BUSPAR) 10 MG tablet Take 1 tablet (10 mg total) by mouth 3 (three) times daily. (Patient not taking: Reported on 02/17/2020) 270 tablet 1  . furosemide (LASIX) 20 MG tablet Take 1 tablet (20 mg total) by mouth daily. 90 tablet 3  . lidocaine-prilocaine (EMLA) cream Apply to affected area once (Patient not taking: Reported on 02/17/2020) 30 g 3  . lisinopril (ZESTRIL) 20 MG tablet Take 1 tablet (20 mg total) by mouth daily. 90 tablet 3  . mirtazapine (REMERON) 7.5 MG tablet Take 1 tablet (7.5 mg total) by mouth at bedtime. (Patient not taking: Reported on 02/17/2020) 30 tablet 0  . predniSONE  (DELTASONE) 10 MG tablet Take 2 tabs x 5 days (Patient not taking: Reported on 02/17/2020) 10 tablet 0   No current facility-administered medications for this visit.     Objective:  BP 136/60   Pulse 70   Temp 98 F (36.7 C) (Temporal)   Ht 5\' 2"  (1.575 m)   Wt 199 lb (90.3 kg)   LMP  (LMP Unknown)   SpO2 96%   BMI 36.40 kg/m  Gen: NAD, resting comfortably CV: RRR no murmurs rubs or gallops Lungs: CTAB no crackles, wheeze, rhonchi though diminshed in the bases Ext: 1+ edema Skin: warm, dry Neuro: grossly normal, moves all extremities     Assessment and Plan  2 month F/U  #Breast cancer-recurrence in left axilla with lymph node dissection August 05, 2019. 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  Cancer is no longer curable but is treatable.  She is on chemotherapy.  Patient understands this is palliative and not curative. -she wants to continue all these efforts- she thinks prolonging life is worht it at this point despite decreased quality of life from all her visits   #Shortness of breath S: She complains of Shortness of Breath.  Patient recently had echocardiogram through oncology ordered-ejection fraction was 60 to 65%.  Mild left ventricular hypertrophy was noted.  She did have grade 1 diastolic dysfunction.  Moderate pleural effusion was noted in left lateral region.  Shortness of breath likely related to pleural effusion, cancer.  At times her chemotherapy has had to be held this and she is even required blood transfusion.  Plan is for ultrasound-guided left thoracentesis for diagnostic this and symptom relief.  VQ scan has been negative for pulmonary embolism.  Breo improved her breathing some.  Shortness of breath even getting  to her bathroom.   A/P: I think the left pleural effusion is playing a big role in shortness of breath- she is going to try to reschedule her thoracentesis which sounds like was missed.  I encouraged her to follow through with this  #hypertension/CKD III/edema S: medication: Amlodipine 10mg , lisinopril-hctz 20-12.5mg , Clonidine 0.3mg , Carvedilol 25mg  BID  Reports being off spironolactone for last 2 weeks.   Labs just done 09/11/2019- cr 1.57 with GFR at 36  BP Readings from Last 3 Encounters:  02/17/20 136/60  02/09/20 (!) 120/50  02/03/20 (!) 129/58  A/P: Good control but I dont think hctz is as effective at this level and doesn't help with renal function either- lasix would be a better choice -Stop lisinopril hydrochlorothiazide 20-12.5 mg -Start lisinopril 20 mg alone -Start Lasix 20 mg back. -Follow-up with me in 2 or 3 weeks so we can see how you are doing with these changes -I am hoping this helps with your swelling.  Since you have been off the spironolactone for 2 weeks let us not restart quite yet-we will reconsider when we see you back  #hyperlipidemia S: Medication: Lovastatin 40mg  Lab Results  Component Value Date   CHOL 183 04/01/2019   HDL 55.70 04/01/2019   LDLCALC 93 04/01/2019   LDLDIRECT 140.0 09/12/2018   TRIG 171.0 (H) 04/01/2019   CHOLHDL 3 04/01/2019   A/P: above ideal goal LDL with diabetes but wit current cancer situation hold off on increasing rx.  -we did opt to stop aspirin with no history of heart attack or stroke  # Diabetes S: Medication: Lantus 28units, Novolin 10-16units. Off metformin due to renal issues Lab Results  Component Value Date   HGBA1C 8.5 (H) 11/07/2019   HGBA1C 8.4 (A) 07/24/2019   HGBA1C 8.8 (H) 04/01/2019    A/P: encouraged follow up with Dr. Cruzita Lederer. If she doesn't have this scheduled by follow up we may get an a1c poc at that time vs with bloodwork if we have to draw blood  # Depression S: Medication: Buspar 10mg  three  times a day Depression screen [facility] 2/9 02/17/2020 11/07/2019 09/03/2019  Decreased Interest 0 0 0  Down, Depressed, Hopeless 1 0 0  PHQ - 2 Score 1 0 0  Altered sleeping 0 0 0  Tired, decreased energy 1 0 0  Change in appetite 0 0 0  Feeling bad or failure  about yourself  1 0 0  Trouble concentrating 0 0 0  Moving slowly or fidgety/restless 0 0 0  Suicidal thoughts 0 0 0  PHQ-9 Score 3 0 0  Difficult doing work/chores Somewhat difficult Not difficult at all Not difficult at all  Some recent data might be hidden   A/P: reasonable control on current meds- just needs breathing situation to improve.  - she is not interested in doing counseling at this point- declines  Recommended follow up: Return in about 3 weeks (around 03/09/2020) for follow up- or sooner if needed. Future Appointments  Date Time Provider [place]  02/23/2020  8:15 AM [facility] LAB 2 [facility] None  02/23/2020  8:45 AM Alla Feeling, NP [facility] None  02/23/2020  9:30 AM [facility] INFUSION [facility] None  02/25/2020  2:45 PM Mannam, Praveen, MD [facility] None  03/02/2020  9:15 AM [facility] LAB 6 [facility] None  03/02/2020  9:45 AM Alla Feeling, NP [facility] None  03/02/2020 11:00 AM [facility] INFUSION [facility] None  03/08/2020  8:30 AM [facility] LAB 2 [facility] None  03/08/2020  9:00 AM [name], MD [facility] None  03/08/2020  9:30 AM [facility] INFUSION [facility] None  03/11/2020  1:30 PM [facility] LAB 3 [facility] None  03/11/2020  2:00 PM [name], MD [facility] None    Lab/Order associations:   ICD-10-CM   1. Hypertension associated with diabetes ([place])  E11.59    I10   2. Hyperlipidemia associated with type 2 diabetes mellitus ([place])  E11.69    E78.5   3. Stage 3 chronic kidney disease, unspecified whether stage 3a or 3b CKD  N18.30   4. Bilateral leg edema  R60.0     Meds ordered this encounter  Medications  . lisinopril (ZESTRIL) 20 MG tablet    Sig: Take 1 tablet (20  mg total) by mouth daily.    Dispense:  90 tablet    Refill:  3  . furosemide (LASIX) 20 MG tablet    Sig: Take 1 tablet (20 mg total) by mouth daily.    Dispense:  90 tablet    Refill:  3    Return precautions advised.  Garret Reddish, MD

## 2020-02-18 ENCOUNTER — Telehealth: Payer: Self-pay

## 2020-02-18 NOTE — Telephone Encounter (Signed)
I spoke with Wendy [name] today.  I let her know MS [facility]master' u/s thoracentesis has been rescheduled for 5/26 at 1000.  I told her Sandi Mealy, [place] can do the covid testing 5/25.  Scheduling message sent.  She verbalized understanding.

## 2020-02-21 ENCOUNTER — Other Ambulatory Visit: Payer: Self-pay | Admitting: Primary Care

## 2020-02-21 NOTE — Progress Notes (Signed)
[place]   [phone] [phone]   Clinic Follow up Note   Patient Care Team: Marin Olp, MD as PCP - General (Family Medicine) Philemon Kingdom, MD as Consulting Physician (Internal Medicine) Jalene Mullet, MD as Consulting Physician (Ophthalmology) Erroll Luna, MD as Consulting Physician (General Surgery) Kyung Rudd, MD as Consulting Physician (Radiation Oncology) [name], MD as Consulting Physician (Hematology) Gardenia Phlegm, NP as Nurse Practitioner (Hematology and Oncology) Marshell Garfinkel, MD as Consulting Physician (Pulmonary Disease) 02/23/2020  CHIEF COMPLAINT: F/u left breast cancer   SUMMARY OF ONCOLOGIC HISTORY: Oncology History Overview Note  Cancer Staging Malignant neoplasm of upper-outer quadrant of left breast in female, estrogen receptor positive ([place]) Staging form: Breast, AJCC 8th Edition - Clinical: Stage IIB (cT2, cN0, cM0, G3, ER: Positive, PR: Negative, HER2: Negative) - Signed by [name], MD on 11/23/2016 - Pathologic stage from 12/28/2016: Stage IIB (pT2, pN1a(sn), cM0, G3, ER: Positive, PR: Negative, HER2: Negative) - Signed by [name], MD on 01/02/2017     Malignant neoplasm of upper-outer quadrant of left breast in female, estrogen receptor positive ([place])  11/01/2016 Mammogram   Diagnostic mammogram and ultrasound of left breast and axilla showed a 3.1 x 2.1 x 1.4 cm (3.3 x 2.0 x 2.7 cm by ultrasound) mass in the upper outer quadrant of the anterior third of the left breast, associated with pleomorphic calcification. There is a 8 mm (1.8cm by [name]) prominent lymph node in the left axilla.   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   11/07/2016 Receptors her2   ER 80% positive, PR negative, HER-2 negative, Ki-67 90%   11/20/2016 Initial Diagnosis   Malignant neoplasm of upper-outer  quadrant of left breast in female, estrogen receptor positive ([place])   12/07/2016 Surgery   Left lumpectomy and left axillary sentinel lymph node sampling by Dr. Brantley Stage   12/07/2016 Pathology Results   pT2, pN1 Left Lumpectomy: Grade 3 IDC measuring 3.4 cm, carcinoma broadly present at the superior margin. Grade 3 DCIS. 1 out of 2 left axillary SLN positive for metastatic carcinoma   12/07/2016 Miscellaneous   Mammaprint showed high risk disease, basal type    12/28/2016 Surgery   Re-excision of the previously positive superior margin was negative for malignant cells.    01/04/2017 Surgery   Port inserted   01/18/2017 - 03/22/2017 Chemotherapy   Adjuvant Docetaxel 75 mg/m and Cytoxan 600 mg/m, every 21 days, for total of 4 cycles, with Neulasta on day 2.    04/23/2017 - 05/18/2017 Radiation Therapy   Radiation treatment dates:   04/23/17 - 05/18/17 Administered by Dr. Lisbeth Renshaw  Site/dose:    Left breast/ 42.5 Gy in 17 Fx Boost / 7.5 Gy in 3 Fx   06/12/2017 - 08/14/2019 Anti-estrogen oral therapy   Adjuvant letrozole 1 mg daily, plan for 5-7 years. D/c on 08/14/19 due to local recurrence of left axilla.     07/20/2017 Mammogram   IMPRESSION: No mammographic evidence of malignancy in either breast. 3.8 cm left breast postsurgical loculated seroma.   09/12/2017 Survivorship     09/19/2018 Imaging   Baseline DEXA 09/19/18 ASSESSMENT: The BMD measured at Femur Neck Left is 0.888 g/cm2 with a T-score of -1.1. This patient is considered osteopenic according to [facility]) criteria.   The scan quality is good. L-4 was excluded due to degenerative changes.   Site Region  Measured Date Measured Age YA BMD Significant CHANGE T-score AP Spine  L1-L3      09/19/[age]         -0.7    1.084 g/cm2   DualFemur Neck Left  09/19/2018    77.2         -1.1    0.888 g/cm2   DualFemur Total Mean 09/19/2018    77.2         -0.5    0.946 g/cm2 ASSESSMENT: The probability of a  major osteoporotic fracture is 4.5 % within the next ten years.   The probability of hip fracture is 0.8  % within the next 10 years.   06/25/2019 Mammogram   Diagnostic mammogram 06/25/19  IMPRESSION: 1.  Two morphologically abnormal lymph nodes in the left axilla with a lymph node in the lower left axilla measuring 2.4 x 2 x 2.6 cm.   2. Stable lumpectomy changes left breast with no findings of malignancy in either breast.   07/01/2019 Pathology Results   Diagnosis Lymph node, needle/core biopsy, left axilla - POORLY DIFFERENTIATED CARCINOMA CONSISTENT WITH BREAST PRIMARY. - NO LYMPHOID NODAL TISSUE IDENTIFIED. - SEE MICROSCOPIC DESCRIPTION. Microscopic Comment The carcinoma is consistent with grade III.  PROGNOSTIC INDICATORS Results: IMMUNOHISTOCHEMICAL AND MORPHOMETRIC ANALYSIS PERFORMED MANUALLY The tumor cells are NEGATIVE for Her2 (1+). Estrogen Receptor: 80%, POSITIVE, WEAK STAINING INTENSITY Progesterone Receptor: 0%, NEGATIVE Proliferation Marker Ki67: 80%   07/18/2019 Breast MRI   Breast MRI 07/18/19  IMPRESSION: 1. Two morphologically abnormal level 1 left axillary lymph nodes correlating with biopsy-proven malignancy. 2. No MRI evidence of malignancy in either breast. 3. Postsurgical changes of the left breast consistent with prior lumpectomy.   07/25/2019 PET scan   PET 07/25/19  IMPRESSION: 1. Hypermetabolic LEFT axillary mass consistent with metastatic breast cancer. 2. High LEFT axillary/posterior triangle (level 5) lymph node consistent with local metastasis. 3. No central mediastinal nodal metastasis. No suspicious pulmonary nodules. 4. No distant soft tissue metastasis or skeletal metastasis. 5. Calcified fibroid uterus   08/05/2019 Surgery   LEFT AXILLARY LYMPH NODE DISSECTION by Dr. Brantley Stage 08/05/19    08/05/2019 Pathology Results   FINAL MICROSCOPIC DIAGNOSIS: 08/05/19    A. LYMPH NODES, LEFT AXILLARY, DISSECTION:  - Metastatic carcinoma.   - See comment.    08/2019 - 01/13/2020 Anti-estrogen oral therapy   Exemestane 36m starting 08/2019. Stopped with the start of chemo in 01/2020   09/01/2019 Genetic Testing   [facility] Gen[employer] Findings:  -RET amplification -CDK8 amplification -NOTCH1 deletion  -TQM08splice site 3676P>P  15/09/3267Imaging   CT Angio Chest   IMPRESSION: 1. No definitive pulmonary embolism. 2. Multiple new small bilateral pulmonary nodules consistent with metastatic disease. 3. Soft tissue density in the left axilla at the site of node dissection, probably representing scarring but I cannot exclude tumor recurrence. 4. New partially healed fracture of the posterior aspect of the right eighth rib.   12/04/2019 - 01/13/2020 Chemotherapy   Verzenio 1573mBID starting 12/04/19. Stopped a week before the start of chemo in 01/2020   01/07/2020 PET scan   IMPRESSION: 1. Marked worsening of disease with soft tissue recurrence in the left axilla, new and enlarging lymph nodes in the supraclavicular/high axillary region, new mediastinal adenopathy and new pulmonary pleural and parenchymal disease. 2. Metastatic disease to the right hepatic lobe. 3. Signs of bony metastasis. 4. New pleural effusions presumably malignant and associated with pleural nodularity.   01/20/2020 -  Chemotherapy  Abraxane on days 1, 8, 15 q28 days; started 01/20/20      CURRENT THERAPY: Abraxane on days 1, 8, 15 q28 days; started 01/20/20  INTERVAL HISTORY: Ms. Angelucci returns for f/u and treatment as scheduled. She presents for cycle 2 day 1 of Abraxane.  She has shortness of breath and dyspnea on exertion along with bilateral lower extremity edema despite her use of Lasix 20 mg once a day.  She is scheduled to have a thoracentesis on Wednesday but would like to have this moved up if possible.  She will have Covid testing collected today.  She denies fevers, chills, sweats, headache, nausea, vomiting, constipation, or diarrhea.    REVIEW OF SYSTEMS:   Constitutional: Denies fevers, chills or abnormal weight loss Eyes: Denies blurriness of vision Ears, nose, mouth, throat, and face: Denies mucositis or sore throat Respiratory: She has progressive shortness of breath and dyspnea on exertion. Cardiovascular: She has bilateral lower extremity edema.  She denies palpitation or chest discomfort.  Gastrointestinal:  Denies nausea, heartburn or change in bowel habits Skin: Denies abnormal skin rashes Lymphatics: Denies new lymphadenopathy or easy bruising Neurological:Denies numbness, tingling or new weaknesses Behavioral/Psych: Mood is stable, no new changes  All other systems were reviewed with the patient and are negative.  MEDICAL HISTORY:  Past Medical History:  Diagnosis Date  . Abnormal breast finding 2018   per pt/ having  a lot drainage from left breast nipple  . Anemia   . Anxiety   . Breast cancer ([place]) 11/2016   left/  . CKD (chronic kidney disease)   . Depression   . GERD (gastroesophageal reflux disease)    TUMS as needed  . HTN (hypertension)    states BP has been high recently; has been on med. x 20 yr.  . Hyperlipidemia   . Hyperplastic colon polyp   . HYPERTENSION, BENIGN SYSTEMIC 11/29/2006   Lisinopril hctz 10-12.76m, amlodipine 113m coreg 2519mID, clonidine 0.3 mg BID.   . IMarland Kitchensulin dependent diabetes mellitus   . Personal history of chemotherapy    2018/finished 6 weeks of chemo in Sep 2018  . Personal history of radiation therapy    2018 left breast/finished radiation in Sept 2018 per pt.    SURGICAL HISTORY: Past Surgical History:  Procedure Laterality Date  . AXILLARY LYMPH NODE DISSECTION Left 08/05/2019   Procedure: LEFT AXILLARY LYMPH NODE DISSECTION;  Surgeon: CorErroll LunaD;  [facility];  Laterality: Left;  . BREAST LUMPECTOMY Left 05/05/2008  . BREAST LUMPECTOMY Left 12/07/2016   malignant  . BREAST LUMPECTOMY WITH  RADIOACTIVE SEED AND SENTINEL LYMPH NODE BIOPSY Left 12/07/2016   Procedure: LEFT BREAST LUMPECTOMY WITH RADIOACTIVE SEED AND SENTINEL LYMPH NODE BIOPSY;  Surgeon: ThoErroll LunaD;  [facility];  Laterality: Left;  . IR FLUORO GUIDE PORT INSERTION RIGHT  01/04/2017  . IR US [place] VASC ACCESS RIGHT  01/04/2017  . PORT-A-CATH REMOVAL N/A 05/30/2017   Procedure: REMOVAL PORT-A-CATH;  Surgeon: CorErroll LunaD;  [facility];  Laterality: N/A;  . RE-EXCISION OF BREAST LUMPECTOMY Left 12/28/2016   Procedure: RE-EXCISION OF BREAST LUMPECTOMY;  Surgeon: ThoErroll LunaD;  [facility];  Laterality: Left;    I have reviewed the social history and family history with the patient and they are unchanged from previous note.  ALLERGIES:  has No Known Allergies.  MEDICATIONS:  Current Outpatient  Medications  Medication Sig Dispense Refill  . albuterol (VENTOLIN HFA) 108 (90 Base) MCG/ACT inhaler Inhale 2 puffs into the lungs every 6 (six) hours as needed for wheezing or shortness of breath. 18 g 2  . amLODipine (NORVASC) 10 MG tablet TAKE 1 TABLET BY MOUTH EVERY DAY (Patient taking differently: Take 10 mg by mouth daily. ) 90 tablet 1  . B-D INS SYRINGE 0.5CC/31GX5/16 31G X 5/16" 0.5 ML MISC USE AS DIRECTED 4 TIMES A DAY 300 each 1  . busPIRone (BUSPAR) 10 MG tablet Take 1 tablet (10 mg total) by mouth 3 (three) times daily. (Patient not taking: Reported on 02/17/2020) 270 tablet 1  . carvedilol (COREG) 25 MG tablet Take 1 tablet (25 mg total) by mouth 2 (two) times daily with a meal. 180 tablet 3  . cloNIDine (CATAPRES) 0.3 MG tablet TAKE 1 TABLET (0.3 MG TOTAL) BY MOUTH 2 (TWO) TIMES DAILY. 180 tablet 1  . famotidine (PEPCID) 20 MG tablet TAKE 1 TABLET BY MOUTH EVERY DAY 30 tablet 1  . fluticasone (FLONASE) 50 MCG/ACT nasal spray Place 1 spray into both nostrils daily. 16 g 2  . fluticasone  furoate-vilanterol (BREO ELLIPTA) 200-25 MCG/INH AEPB Inhale 1 puff into the lungs daily. 1 each 0  . fluticasone furoate-vilanterol (BREO ELLIPTA) 200-25 MCG/INH AEPB Inhale 1 puff into the lungs daily. 3 each 3  . furosemide (LASIX) 20 MG tablet Take 1 tablet (20 mg total) by mouth daily. 90 tablet 3  . HYDROcodone-acetaminophen (NORCO/VICODIN) 5-325 MG tablet Take 1 tablet by mouth every 8 (eight) hours as needed for moderate pain. 15 tablet 0  . Insulin Glargine (LANTUS SOLOSTAR) 100 UNIT/ML Solostar Pen Inject 28 units into the skin in am (Patient taking differently: Inject 28 Units into the skin every morning. ) 10 pen 3  . Insulin Pen Needle 31G X 5 MM MISC Use pen needles for insulin injection daily 100 each 3  . insulin regular (NOVOLIN R) 100 units/mL injection Inject 0.14-0.18 mLs (14-18 Units total) into the skin 3 (three) times daily before meals. Relion. Please provide insulin syringes needed (Patient taking differently: Inject 10-16 Units into the skin See admin instructions. 16 units in the morning and 10 units at night) 20 mL 11  . Insulin Syringe-Needle U-100 (B-D INS SYRINGE 0.5CC/30GX1/2") 30G X 1/2" 0.5 ML MISC USE AS DIRECTED 4 TIMES A DAY 300 each 3  . lidocaine-prilocaine (EMLA) cream Apply to affected area once (Patient not taking: Reported on 02/17/2020) 30 g 3  . lisinopril (ZESTRIL) 20 MG tablet Take 1 tablet (20 mg total) by mouth daily. 90 tablet 3  . lovastatin (MEVACOR) 40 MG tablet TAKE 1 TABLET BY MOUTH EVERYDAY AT BEDTIME (Patient taking differently: Take 40 mg by mouth at bedtime. ) 90 tablet 3  . mirtazapine (REMERON) 7.5 MG tablet Take 1 tablet (7.5 mg total) by mouth at bedtime. (Patient not taking: Reported on 02/17/2020) 30 tablet 0  . montelukast (SINGULAIR) 10 MG tablet Take 1 tablet (10 mg total) by mouth at bedtime. 30 tablet 3  . ondansetron (ZOFRAN) 8 MG tablet Take 1 tablet (8 mg total) by mouth 2 (two) times daily as needed (Nausea or vomiting). 30 tablet 1   . OVER THE COUNTER MEDICATION Take 1 tablet by mouth daily as needed. Stool softner for constipation, also drinks warm prune juice prn    . polyethylene glycol powder (GLYCOLAX/MIRALAX) 17 GM/SCOOP powder Take 17 g by mouth daily. (Patient taking differently: Take 17 g by  mouth as needed for mild constipation or moderate constipation. ) 1 g 0  . predniSONE (DELTASONE) 10 MG tablet Take 2 tabs x 5 days (Patient not taking: Reported on 02/17/2020) 10 tablet 0  . traMADol (ULTRAM) 50 MG tablet TAKE 1 TABLET BY MOUTH EVERY 6 HOURS AS NEEDED FOR MODERATE/SEVERE PAIN (NECK AND SHOULDER PAIN). (Patient taking differently: Take 50 mg by mouth every 6 (six) hours as needed for moderate pain or severe pain (neck and shoulder pain). ) 20 tablet 0   No current facility-administered medications for this visit.    PHYSICAL EXAMINATION: ECOG PERFORMANCE STATUS: 1 - Symptomatic but completely ambulatory  Vitals:   02/23/20 0851  BP: (!) 135/59  Pulse: 68  Resp: 18  SpO2: 100%   Filed Weights   02/23/20 0851  Weight: 91.9 kg (202 lb 11.2 oz)    GENERAL:alert, no distress and comfortable SKIN: skin color, texture, turgor are normal, no rashes or significant lesions EYES: normal, Conjunctiva are pink and non-injected, sclera clear OROPHARYNX:no exudate, no erythema and lips, buccal mucosa, and tongue normal  LYMPH:  no palpable lymphadenopathy in the cervical, axillary or epitrochlear  LUNGS: Decreased auscultation and percussion in the left lower lobe otherwise lungs are clear clear to auscultation and percussion with normal breathing effort HEART: regular rate & rhythm and no murmurs.  Trace bilateral lower extremity edema ABDOMEN:abdomen soft, non-tender and normal bowel sounds Musculoskeletal:no cyanosis of digits and no clubbing  NEURO: alert & oriented x 3 with fluent speech, no focal motor/sensory deficits  LABORATORY DATA:  I have reviewed the data as listed CBC Latest Ref Rng & Units  02/23/2020 02/09/2020 02/02/2020  WBC 4.0 - 10.5 K/uL 6.1 4.1 3.8(L)  Hemoglobin 12.0 - 15.0 g/dL 10.7(L) 10.5(L) 7.7(L)  Hematocrit 36.0 - 46.0 % 31.4(L) 30.7(L) 22.8(L)  Platelets 150 - 400 K/uL 272 266 296     CMP Latest Ref Rng & Units 02/23/2020 02/09/2020 02/02/2020  Glucose 70 - 99 mg/dL 300(H) 210(H) 282(H)  BUN 8 - 23 mg/dL 19 17 29(H)  Creatinine 0.44 - 1.00 mg/dL 1.50(H) 1.57(H) 1.89(H)  Sodium 135 - 145 mmol/L 139 141 138  Potassium 3.5 - 5.1 mmol/L 4.8 3.9 4.3  Chloride 98 - 111 mmol/L 105 107 107  CO2 22 - 32 mmol/L 25 22 21(L)  Calcium 8.9 - 10.3 mg/dL 9.1 8.6(L) 8.5(L)  Total Protein 6.5 - 8.1 g/dL 6.7 6.4(L) 6.6  Total Bilirubin 0.3 - 1.2 mg/dL 0.3 0.4 0.3  Alkaline Phos 38 - 126 U/L 106 79 81  AST 15 - 41 U/L 12(L) 10(L) 9(L)  ALT 0 - 44 U/L 6 <6 7      RADIOGRAPHIC STUDIES: I have personally reviewed the radiological images as listed and agreed with the findings in the report. No results found.   ASSESSMENT & PLAN:  No problem-specific Assessment & Plan notes found for this encounter.  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  She will return for follow-up on 03/02/2020.  Left pleural effusion with shortness of breath: The patient will have a left thoracentesis completed tomorrow.  A COVID-19 test was collected today with results pending.  Orders Placed This Encounter  Procedures  . SARS Coronavirus 2 (TAT 6-24 hrs)    Pending thoracentesis    Order Specific Question:   Is this test for diagnosis or screening    Answer:   Screening    Order Specific Question:  Symptomatic for COVID-19 as defined by CDC    Answer:   No    Order Specific Question:   Hospitalized for COVID-19    Answer:   No    Order Specific Question:   Admitted to ICU for COVID-19    Answer:   No    Order Specific Question:   Previously tested for COVID-19    Answer:   Yes    Order Specific Question:   Resident in  a congregate (group) care setting    Answer:   No    Order Specific Question:   Employed in healthcare setting    Answer:   No    Order Specific Question:   Pregnant    Answer:   No    Order Specific Question:   Has patient completed COVID vaccination(s) (2 doses of Pfizer/Moderna 1 dose of [employer])    Answer:   Yes   All questions were answered. The patient knows to call the clinic with any problems, questions or concerns. No barriers to learning was detected.  This case was discussed with Dr. Burr Medico. She expressed agreement with my management of this patient.   Sandi Mealy, MHS, PA-C Physician Assistant

## 2020-02-23 ENCOUNTER — Inpatient Hospital Stay (HOSPITAL_BASED_OUTPATIENT_CLINIC_OR_DEPARTMENT_OTHER): Payer: Medicare Other | Admitting: Medical

## 2020-02-23 ENCOUNTER — Inpatient Hospital Stay: Payer: Medicare Other | Admitting: Medical

## 2020-02-23 ENCOUNTER — Inpatient Hospital Stay: Payer: Medicare Other

## 2020-02-23 ENCOUNTER — Other Ambulatory Visit: Payer: Self-pay

## 2020-02-23 ENCOUNTER — Other Ambulatory Visit: Payer: Self-pay | Admitting: *Deleted

## 2020-02-23 VITALS — BP 135/59 | HR 68 | Resp 18 | Ht 62.0 in | Wt 202.7 lb

## 2020-02-23 DIAGNOSIS — R0602 Shortness of breath: Secondary | ICD-10-CM | POA: Diagnosis not present

## 2020-02-23 DIAGNOSIS — C50412 Malignant neoplasm of upper-outer quadrant of left female breast: Secondary | ICD-10-CM

## 2020-02-23 DIAGNOSIS — J90 Pleural effusion, not elsewhere classified: Secondary | ICD-10-CM

## 2020-02-23 DIAGNOSIS — D649 Anemia, unspecified: Secondary | ICD-10-CM | POA: Diagnosis not present

## 2020-02-23 DIAGNOSIS — Z20822 Contact with and (suspected) exposure to covid-19: Secondary | ICD-10-CM | POA: Diagnosis not present

## 2020-02-23 DIAGNOSIS — Z170 Estrogen receptor positive status [ER+]: Secondary | ICD-10-CM

## 2020-02-23 DIAGNOSIS — C7800 Secondary malignant neoplasm of unspecified lung: Secondary | ICD-10-CM | POA: Diagnosis not present

## 2020-02-23 DIAGNOSIS — Z5111 Encounter for antineoplastic chemotherapy: Secondary | ICD-10-CM | POA: Diagnosis not present

## 2020-02-23 DIAGNOSIS — Z7189 Other specified counseling: Secondary | ICD-10-CM

## 2020-02-23 LAB — COMPREHENSIVE METABOLIC PANEL
ALT: 6 U/L (ref 0–44)
AST: 12 U/L — ABNORMAL LOW (ref 15–41)
Albumin: 3.3 g/dL — ABNORMAL LOW (ref 3.5–5.0)
Alkaline Phosphatase: 106 U/L (ref 38–126)
Anion gap: 9 (ref 5–15)
BUN: 19 mg/dL (ref 8–23)
CO2: 25 mmol/L (ref 22–32)
Calcium: 9.1 mg/dL (ref 8.9–10.3)
Chloride: 105 mmol/L (ref 98–111)
Creatinine, Ser: 1.5 mg/dL — ABNORMAL HIGH (ref 0.44–1.00)
GFR calc Af Amer: 38 mL/min — ABNORMAL LOW (ref >60)
GFR calc non Af Amer: 33 mL/min — ABNORMAL LOW (ref >60)
Glucose, Bld: 300 mg/dL — ABNORMAL HIGH (ref 70–99)
Potassium: 4.8 mmol/L (ref 3.5–5.1)
Sodium: 139 mmol/L (ref 135–145)
Total Bilirubin: 0.3 mg/dL (ref 0.3–1.2)
Total Protein: 6.7 g/dL (ref 6.5–8.1)

## 2020-02-23 LAB — CBC WITH DIFFERENTIAL/PLATELET
Abs Immature Granulocytes: 0.14 10*3/uL — ABNORMAL HIGH (ref 0.00–0.07)
Basophils Absolute: 0 10*3/uL (ref 0.0–0.1)
Basophils Relative: 1 %
Eosinophils Absolute: 0.1 10*3/uL (ref 0.0–0.5)
Eosinophils Relative: 2 %
HCT: 31.4 % — ABNORMAL LOW (ref 36.0–46.0)
Hemoglobin: 10.7 g/dL — ABNORMAL LOW (ref 12.0–15.0)
Immature Granulocytes: 2 %
Lymphocytes Relative: 15 %
Lymphs Abs: 0.9 10*3/uL (ref 0.7–4.0)
MCH: 29.8 pg (ref 26.0–34.0)
MCHC: 34.1 g/dL (ref 30.0–36.0)
MCV: 87.5 fL (ref 80.0–100.0)
Monocytes Absolute: 0.5 10*3/uL (ref 0.1–1.0)
Monocytes Relative: 8 %
Neutro Abs: 4.4 10*3/uL (ref 1.7–7.7)
Neutrophils Relative %: 72 %
Platelets: 272 10*3/uL (ref 150–400)
RBC: 3.59 MIL/uL — ABNORMAL LOW (ref 3.87–5.11)
RDW: 19.6 % — ABNORMAL HIGH (ref 11.5–15.5)
WBC: 6.1 10*3/uL (ref 4.0–10.5)
nRBC: 0 % (ref 0.0–0.2)

## 2020-02-23 LAB — SARS CORONAVIRUS 2 (TAT 6-24 HRS): SARS Coronavirus 2: NEGATIVE

## 2020-02-23 MED ORDER — PROCHLORPERAZINE MALEATE 10 MG PO TABS
10.0000 mg | ORAL_TABLET | Freq: Once | ORAL | Status: AC
  Administered 2020-02-23: 10 mg via ORAL

## 2020-02-23 MED ORDER — PROCHLORPERAZINE MALEATE 10 MG PO TABS
ORAL_TABLET | ORAL | Status: AC
  Filled 2020-02-23: qty 1

## 2020-02-23 MED ORDER — SODIUM CHLORIDE 0.9 % IV SOLN
Freq: Once | INTRAVENOUS | Status: AC
  Filled 2020-02-23: qty 250

## 2020-02-23 MED ORDER — PACLITAXEL PROTEIN-BOUND CHEMO INJECTION 100 MG
80.0000 mg/m2 | Freq: Once | INTRAVENOUS | Status: AC
  Administered 2020-02-23: 150 mg via INTRAVENOUS
  Filled 2020-02-23: qty 30

## 2020-02-23 NOTE — Patient Instructions (Signed)

## 2020-02-23 NOTE — Progress Notes (Signed)
OK to treat.  Elson Ulbrich, MHS, PA-C 

## 2020-02-23 NOTE — Patient Instructions (Signed)
[facility] Discharge Instructions for Patients Receiving Chemotherapy  Today you received the following chemotherapy agents: Abraxane  To help prevent nausea and vomiting after your treatment, we encourage you to take your nausea medication as directed.   If you develop nausea and vomiting that is not controlled by your nausea medication, call the clinic.   BELOW ARE SYMPTOMS THAT SHOULD BE REPORTED IMMEDIATELY:  *FEVER GREATER THAN 100.5 F  *CHILLS WITH OR WITHOUT FEVER  NAUSEA AND VOMITING THAT IS NOT CONTROLLED WITH YOUR NAUSEA MEDICATION  *UNUSUAL SHORTNESS OF BREATH  *UNUSUAL BRUISING OR BLEEDING  TENDERNESS IN MOUTH AND THROAT WITH OR WITHOUT PRESENCE OF ULCERS  *URINARY PROBLEMS  *BOWEL PROBLEMS  UNUSUAL RASH Items with * indicate a potential emergency and should be followed up as soon as possible.  Feel free to call the clinic should you have any questions or concerns. The clinic phone number is [phone].  Please show the [facility] at check-in to the Emergency Department and triage nurse.   Blood Transfusion, Adult, Care After This sheet gives you information about how to care for yourself after your procedure. Your doctor may also give you more specific instructions. If you have problems or questions, contact your doctor. What can I expect after the procedure? After the procedure, it is common to have:  Bruising and soreness at the IV site.  A fever or chills on the day of the procedure. This may be your body's response to the new blood cells received.  A headache. Follow these instructions at home: Insertion site care      Follow instructions from your doctor about how to take care of your insertion site. This is where an IV tube was put into your vein. Make sure you: ? Wash your hands with soap and water before and after you change your bandage (dressing). If you cannot use soap and water, use hand sanitizer. ? Change your  bandage as told by your doctor.  Check your insertion site every day for signs of infection. Check for: ? Redness, swelling, or pain. ? Bleeding from the site. ? Warmth. ? Pus or a bad smell. General instructions  Take over-the-counter and prescription medicines only as told by your doctor.  Rest as told by your doctor.  Go back to your normal activities as told by your doctor.  Keep all follow-up visits as told by your doctor. This is important. Contact a doctor if:  You have itching or red, swollen areas of skin (hives).  You feel worried or nervous (anxious).  You feel weak after doing your normal activities.  You have redness, swelling, warmth, or pain around the insertion site.  You have blood coming from the insertion site, and the blood does not stop with pressure.  You have pus or a bad smell coming from the insertion site. Get help right away if:  You have signs of a serious reaction. This may be coming from an allergy or the body's defense system (immune system). Signs include: ? Trouble breathing or shortness of breath. ? Swelling of the face or feeling warm (flushed). ? Fever or chills. ? Head, chest, or back pain. ? Dark pee (urine) or blood in the pee. ? Widespread rash. ? Fast heartbeat. ? Feeling dizzy or light-headed. You may receive your blood transfusion in an outpatient setting. If so, you will be told whom to contact to report any reactions. These symptoms may be an emergency. Do not wait to see  if the symptoms will go away. Get medical help right away. Call your local emergency services (911 in the [place] Do not drive yourself to the hospital. Summary  Bruising and soreness at the IV site are common.  Check your insertion site every day for signs of infection.  Rest as told by your doctor. Go back to your normal activities as told by your doctor.  Get help right away if you have signs of a serious reaction. This information is not intended to  replace advice given to you by your health care provider. Make sure you discuss any questions you have with your health care provider. Document Revised: 03/13/2019 Document Reviewed: 03/13/2019 Elsevier Patient Education  [place].

## 2020-02-24 ENCOUNTER — Ambulatory Visit (HOSPITAL_COMMUNITY)
Admission: RE | Admit: 2020-02-24 | Discharge: 2020-02-24 | Disposition: A | Discharge status: Home / Self Care | Payer: Medicare Other | Source: Ambulatory Visit | Attending: Hematology | Admitting: Hematology

## 2020-02-24 ENCOUNTER — Ambulatory Visit (HOSPITAL_COMMUNITY)
Admission: RE | Admit: 2020-02-24 | Discharge: 2020-02-24 | Disposition: A | Discharge status: Home / Self Care | Payer: Medicare Other | Source: Ambulatory Visit | Attending: Student | Admitting: Student

## 2020-02-24 DIAGNOSIS — J9811 Atelectasis: Secondary | ICD-10-CM | POA: Diagnosis not present

## 2020-02-24 DIAGNOSIS — J90 Pleural effusion, not elsewhere classified: Secondary | ICD-10-CM | POA: Diagnosis not present

## 2020-02-24 DIAGNOSIS — C801 Malignant (primary) neoplasm, unspecified: Secondary | ICD-10-CM | POA: Diagnosis not present

## 2020-02-24 DIAGNOSIS — Z9889 Other specified postprocedural states: Secondary | ICD-10-CM

## 2020-02-24 DIAGNOSIS — Z170 Estrogen receptor positive status [ER+]: Secondary | ICD-10-CM | POA: Diagnosis not present

## 2020-02-24 DIAGNOSIS — C50412 Malignant neoplasm of upper-outer quadrant of left female breast: Secondary | ICD-10-CM | POA: Insufficient documentation

## 2020-02-24 LAB — BODY FLUID CELL COUNT WITH DIFFERENTIAL
Lymphs, Fluid: 80 %
Monocyte-Macrophage-Serous Fluid: 17 % — ABNORMAL LOW (ref 50–90)
Neutrophil Count, Fluid: 3 % (ref 0–25)
Total Nucleated Cell Count, Fluid: UNDETERMINED cu mm (ref 0–1000)

## 2020-02-24 LAB — PROTEIN, PLEURAL OR PERITONEAL FLUID: Total protein, fluid: 3.4 g/dL

## 2020-02-24 LAB — GLUCOSE, PLEURAL OR PERITONEAL FLUID: Glucose, Fluid: 283 mg/dL

## 2020-02-24 MED ORDER — LIDOCAINE HCL 1 % IJ SOLN
INTRAMUSCULAR | Status: AC
  Filled 2020-02-24: qty 20

## 2020-02-24 NOTE — Procedures (Signed)
PROCEDURE SUMMARY:  Successful US guided left thoracentesis. Yielded 320 ml of amber fluid. Pt tolerated procedure well. No immediate complications.  Specimen sent for labs. CXR ordered; no pneumothorax, patient discharged.   EBL < 5 mL  Theresa Duty, NP 02/24/2020 4:20 PM

## 2020-02-24 NOTE — Progress Notes (Signed)
These preliminary result these preliminary results were noted.  Awaiting final report.

## 2020-02-24 NOTE — Progress Notes (Signed)
Pharmacist Chemotherapy Monitoring - Follow Up Assessment    I verify that I have reviewed each item in the below checklist:  . Regimen for the patient is scheduled for the appropriate day and plan matches scheduled date. Marland Kitchen Appropriate non-routine labs are ordered dependent on drug ordered. . If applicable, additional medications reviewed and ordered per protocol based on lifetime cumulative doses and/or treatment regimen.   Plan for follow-up and/or issues identified: No . I-vent associated with next due treatment: No . MD and/or nursing notified: No  Jayel Scaduto D 02/24/2020 4:18 PM

## 2020-02-25 ENCOUNTER — Other Ambulatory Visit: Payer: Self-pay

## 2020-02-25 ENCOUNTER — Encounter: Payer: Self-pay | Admitting: Pulmonary Disease

## 2020-02-25 ENCOUNTER — Ambulatory Visit (HOSPITAL_COMMUNITY): Payer: Medicare Other

## 2020-02-25 ENCOUNTER — Ambulatory Visit (INDEPENDENT_AMBULATORY_CARE_PROVIDER_SITE_OTHER): Payer: Medicare Other | Admitting: Pulmonary Disease

## 2020-02-25 DIAGNOSIS — R0602 Shortness of breath: Secondary | ICD-10-CM

## 2020-02-25 LAB — GRAM STAIN

## 2020-02-25 MED ORDER — BREO ELLIPTA 100-25 MCG/INH IN AEPB
1.0000 | INHALATION_SPRAY | Freq: Every day | RESPIRATORY_TRACT | 0 refills | Status: DC
Start: 2020-02-25 — End: 2020-03-22

## 2020-02-25 MED ORDER — BREO ELLIPTA 200-25 MCG/INH IN AEPB: 1.0000 | INHALATION_SPRAY | Freq: Every day | RESPIRATORY_TRACT | 2 refills | Status: DC

## 2020-02-25 NOTE — Progress Notes (Signed)
Wendy [name]    [id number]    October 24, 1940  Primary Care Physician:Hunter, Brayton Mars, MD  Referring Physician: Marin Olp, MD [place],  [place] [address]  Chief complaint: Consult for dyspnea  HPI: 79 year old with history of breast cancer, hypertension, CKD, hyperlipidemia, diastolic heart failure Referred for evaluation of dyspnea.  She has increasing symptoms over the past 2 months with dyspnea on exertion and at rest.  No cough, sputum production or wheezing.  History of left breast cancer with recurrence in September 2020 with lymph node dissection.  Follows with Dr. Burr Medico, oncology.  I have reviewed the primary care and specialist notes.  Pets: No pets Occupation: Used to run a daycare.  Currently on disability Exposures: Had significant mold exposure in her bathroom which was fixed recently.  No hot tub, Jacuzzi, down pillows or comforter Smoking history: Never smoker Travel history: No significant travel Relevant family history: No significant family history of lung disease  Interval history: Continues on Breo for her asthma.  Ran out of medication yesterday  Has significant worsening of her dyspnea since her last visit.  It is unclear if this is recurrence of her asthma. She was following up with oncology for breast cancer recurrence for which she is receiving chemotherapy. Had a VQ scan which was negative and echo showing diastolic dysfunction. Also underwent thoracentesis yesterday with removal of 320 mL of fluid.  Started on Lasix for hypertension, lower extremity edema.   Outpatient Encounter Medications as of 02/25/2020  Medication Sig  . albuterol (VENTOLIN HFA) 108 (90 Base) MCG/ACT inhaler Inhale 2 puffs into the lungs every 6 (six) hours as needed for wheezing or shortness of breath.  Marland Kitchen amLODipine (NORVASC) 10 MG tablet TAKE 1 TABLET BY MOUTH EVERY DAY (Patient taking differently: Take 10 mg by mouth daily. )  . B-D INS SYRINGE  0.5CC/31GX5/16 31G X 5/16" 0.5 ML MISC USE AS DIRECTED 4 TIMES A DAY  . busPIRone (BUSPAR) 10 MG tablet Take 1 tablet (10 mg total) by mouth 3 (three) times daily.  . carvedilol (COREG) 25 MG tablet Take 1 tablet (25 mg total) by mouth 2 (two) times daily with a meal.  . cloNIDine (CATAPRES) 0.3 MG tablet TAKE 1 TABLET (0.3 MG TOTAL) BY MOUTH 2 (TWO) TIMES DAILY.  . famotidine (PEPCID) 20 MG tablet TAKE 1 TABLET BY MOUTH EVERY DAY  . fluticasone (FLONASE) 50 MCG/ACT nasal spray Place 1 spray into both nostrils daily.  . fluticasone furoate-vilanterol (BREO ELLIPTA) 200-25 MCG/INH AEPB Inhale 1 puff into the lungs daily.  . furosemide (LASIX) 20 MG tablet Take 1 tablet (20 mg total) by mouth daily.  Marland Kitchen HYDROcodone-acetaminophen (NORCO/VICODIN) 5-325 MG tablet Take 1 tablet by mouth every 8 (eight) hours as needed for moderate pain.  . Insulin Glargine (LANTUS SOLOSTAR) 100 UNIT/ML Solostar Pen Inject 28 units into the skin in am (Patient taking differently: Inject 28 Units into the skin every morning. )  . Insulin Pen Needle 31G X 5 MM MISC Use pen needles for insulin injection daily  . insulin regular (NOVOLIN R) 100 units/mL injection Inject 0.14-0.18 mLs (14-18 Units total) into the skin 3 (three) times daily before meals. Relion. Please provide insulin syringes needed (Patient taking differently: Inject 10-16 Units into the skin See admin instructions. 16 units in the morning and 10 units at night)  . Insulin Syringe-Needle U-100 (B-D INS SYRINGE 0.5CC/30GX1/2") 30G X 1/2" 0.5 ML MISC USE AS  DIRECTED 4 TIMES A DAY  . lisinopril (ZESTRIL) 20 MG tablet Take 1 tablet (20 mg total) by mouth daily.  Marland Kitchen lovastatin (MEVACOR) 40 MG tablet TAKE 1 TABLET BY MOUTH EVERYDAY AT BEDTIME (Patient taking differently: Take 40 mg by mouth at bedtime. )  . mirtazapine (REMERON) 7.5 MG tablet Take 1 tablet (7.5 mg total) by mouth at bedtime.  . montelukast (SINGULAIR) 10 MG tablet Take 1 tablet (10 mg total) by mouth  at bedtime.  . ondansetron (ZOFRAN) 8 MG tablet Take 1 tablet (8 mg total) by mouth 2 (two) times daily as needed (Nausea or vomiting).  Marland Kitchen OVER THE COUNTER MEDICATION Take 1 tablet by mouth daily as needed. Stool softner for constipation, also drinks warm prune juice prn  . polyethylene glycol powder (GLYCOLAX/MIRALAX) 17 GM/SCOOP powder Take 17 g by mouth daily. (Patient taking differently: Take 17 g by mouth as needed for mild constipation or moderate constipation. )  . predniSONE (DELTASONE) 10 MG tablet Take 2 tabs x 5 days  . traMADol (ULTRAM) 50 MG tablet TAKE 1 TABLET BY MOUTH EVERY 6 HOURS AS NEEDED FOR MODERATE/SEVERE PAIN (NECK AND SHOULDER PAIN). (Patient taking differently: Take 50 mg by mouth every 6 (six) hours as needed for moderate pain or severe pain (neck and shoulder pain). )  . lidocaine-prilocaine (EMLA) cream Apply to affected area once (Patient not taking: Reported on 02/17/2020)  . [DISCONTINUED] fluticasone furoate-vilanterol (BREO ELLIPTA) 200-25 MCG/INH AEPB Inhale 1 puff into the lungs daily.   No facility-administered encounter medications on file as of 02/25/2020.   Physical Exam: Blood pressure 116/64, pulse 73, temperature 97.9 F (36.6 C), temperature source Oral, height 5\' 2"  (1.575 m), weight 196 lb 12.8 oz (89.3 kg), SpO2 93 %. Gen:      No acute distress HEENT:  EOMI, sclera anicteric Neck:     No masses; no thyromegaly Lungs:    Clear to auscultation bilaterally; normal respiratory effort CV:         Regular rate and rhythm; no murmurs Abd:      + bowel sounds; soft, non-tender; no palpable masses, no distension Ext:   1+ edema; adequate peripheral perfusion Skin:      Warm and dry; no rash Neuro: alert and oriented x 3 Psych: normal mood and affect  Data Reviewed: Imaging: CT 08/15/2018-no PE, dilatation of main pulmonary artery, enlarged left axillary lymph node. PET scan 07/25/2019-metabolic left axillary mass and lymph node.  No significant lung  disease noted CTA 10/31/2019-PE, multiple small bilateral pulmonary nodules with soft tissue density concerning for metastatic cancer PET scan 01/07/2020-worsening metastatic breast cancer with new pleural effusions. VQ scan 01/26/2020-negative Reviewed images personally.  PFTs: 10/16/2018 FVC 1.49 [78%], FEV1 1.15 [78%], F/F 77, TLC 2.85 [59%] Severe restriction.  Could not complete diffusion capacity  Labs: CBC 09/11/2019-WBC 7.8, eos 10%, absolute eosinophil count 780  CTD serologies 10/29/2018 1-1 is 240 ANA, negative CCP, rheumatoid factor RAST profile 10/30/2019-IgE 268, sensitive to grass cockroach, tree pollen HP panel 10/22/2019-negative  Cardiac: Echo 08/12/2018 LVEF 55-60%, increased thickness of atrial septum, PA peak pressure 35  Echo 02/15/2020-LVEF 60-65%, mild LVH with grade 1 diastolic dysfunction, mild elevation in PA systolic pressure.  Assessment:  Asthma, dyspnea She does have elevated peripheral eosinophils suggestive of allergies, asthma.  History also notable for significant mold exposure but no ILD on CT scan.  Borderline positive ANA is likely non significant. Continue Breo, awaiting repeat PFTs  Suspect her significant dyspnea may be secondary to  breast cancer recurrence, pleural effusion.  She recently had a thoracentesis Continue monitoring.  If the fluid is a malignant effusion and there is recurrence then we may consider a Pleurx catheter.  Diastolic heart failure, lower extremity edema Recently started on Lasix.  Continue monitoring.  Plan/Recommendations: - Continue inhalers - Awaiting PFTs.  Marshell Garfinkel MD [facility] 02/25/2020, 3:03 PM  CC: Marin Olp, MD

## 2020-02-25 NOTE — Patient Instructions (Signed)
We will restart you back on Breo We will continue monitoring your chest x-ray for recurrence of fluid.   Follow-up in 3 months.

## 2020-02-26 LAB — CYTOLOGY - NON PAP

## 2020-02-29 LAB — CULTURE, BODY FLUID W GRAM STAIN -BOTTLE: Culture: NO GROWTH

## 2020-02-29 NOTE — Progress Notes (Deleted)
[facility]   [phone] [phone]   Clinic Follow up Note   Patient Care Team: Marin Olp, MD as PCP - General (Family Medicine) Philemon Kingdom, MD as Consulting Physician (Internal Medicine) Jalene Mullet, MD as Consulting Physician (Ophthalmology) Erroll Luna, MD as Consulting Physician (General Surgery) Kyung Rudd, MD as Consulting Physician (Radiation Oncology) [name], MD as Consulting Physician (Hematology) Gardenia Phlegm, NP as Nurse Practitioner (Hematology and Oncology) Marshell Garfinkel, MD as Consulting Physician (Pulmonary Disease) 02/29/2020  CHIEF COMPLAINT: F/u metastatic breast cancer   SUMMARY OF ONCOLOGIC HISTORY: Oncology History Overview Note  Cancer Staging Malignant neoplasm of upper-outer quadrant of left breast in female, estrogen receptor positive ([place]) Staging form: Breast, AJCC 8th Edition - Clinical: Stage IIB (cT2, cN0, cM0, G3, ER: Positive, PR: Negative, HER2: Negative) - Signed by [name], MD on 11/23/2016 - Pathologic stage from 12/28/2016: Stage IIB (pT2, pN1a(sn), cM0, G3, ER: Positive, PR: Negative, HER2: Negative) - Signed by [name], MD on 01/02/2017     Malignant neoplasm of upper-outer quadrant of left breast in female, estrogen receptor positive ([place])  11/01/2016 Mammogram   Diagnostic mammogram and ultrasound of left breast and axilla showed a 3.1 x 2.1 x 1.4 cm (3.3 x 2.0 x 2.7 cm by ultrasound) mass in the upper outer quadrant of the anterior third of the left breast, associated with pleomorphic calcification. There is a 8 mm (1.8cm by [name]) prominent lymph node in the left axilla.   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   11/07/2016 Receptors her2   ER 80% positive, PR negative, HER-2 negative, Ki-67 90%   11/20/2016 Initial Diagnosis   Malignant neoplasm of upper-outer  quadrant of left breast in female, estrogen receptor positive ([place])   12/07/2016 Surgery   Left lumpectomy and left axillary sentinel lymph node sampling by Dr. Brantley Stage   12/07/2016 Pathology Results   pT2, pN1 Left Lumpectomy: Grade 3 IDC measuring 3.4 cm, carcinoma broadly present at the superior margin. Grade 3 DCIS. 1 out of 2 left axillary SLN positive for metastatic carcinoma   12/07/2016 Miscellaneous   Mammaprint showed high risk disease, basal type    12/28/2016 Surgery   Re-excision of the previously positive superior margin was negative for malignant cells.    01/04/2017 Surgery   Port inserted   01/18/2017 - 03/22/2017 Chemotherapy   Adjuvant Docetaxel 75 mg/m and Cytoxan 600 mg/m, every 21 days, for total of 4 cycles, with Neulasta on day 2.    04/23/2017 - 05/18/2017 Radiation Therapy   Radiation treatment dates:   04/23/17 - 05/18/17 Administered by Dr. Lisbeth Renshaw  Site/dose:    Left breast/ 42.5 Gy in 17 Fx Boost / 7.5 Gy in 3 Fx   06/12/2017 - 08/14/2019 Anti-estrogen oral therapy   Adjuvant letrozole 1 mg daily, plan for 5-7 years. D/c on 08/14/19 due to local recurrence of left axilla.     07/20/2017 Mammogram   IMPRESSION: No mammographic evidence of malignancy in either breast. 3.8 cm left breast postsurgical loculated seroma.   09/12/2017 Survivorship     09/19/2018 Imaging   Baseline DEXA 09/19/18 ASSESSMENT: The BMD measured at Femur Neck Left is 0.888 g/cm2 with a T-score of -1.1. This patient is considered osteopenic according to [facility]) criteria.   The scan quality is good. L-4 was excluded due to degenerative changes.   Site Region  Measured Date Measured Age YA BMD Significant CHANGE T-score AP Spine  L1-L3      09/19/[age]    77.2         -0.7    1.084 g/cm2   DualFemur Neck Left  09/19/2018    77.2         -1.1    0.888 g/cm2   DualFemur Total Mean 09/19/2018    77.2         -0.5    0.946 g/cm2 ASSESSMENT: The probability of a  major osteoporotic fracture is 4.5 % within the next ten years.   The probability of hip fracture is 0.8  % within the next 10 years.   06/25/2019 Mammogram   Diagnostic mammogram 06/25/19  IMPRESSION: 1.  Two morphologically abnormal lymph nodes in the left axilla with a lymph node in the lower left axilla measuring 2.4 x 2 x 2.6 cm.   2. Stable lumpectomy changes left breast with no findings of malignancy in either breast.   07/01/2019 Pathology Results   Diagnosis Lymph node, needle/core biopsy, left axilla - POORLY DIFFERENTIATED CARCINOMA CONSISTENT WITH BREAST PRIMARY. - NO LYMPHOID NODAL TISSUE IDENTIFIED. - SEE MICROSCOPIC DESCRIPTION. Microscopic Comment The carcinoma is consistent with grade III.  PROGNOSTIC INDICATORS Results: IMMUNOHISTOCHEMICAL AND MORPHOMETRIC ANALYSIS PERFORMED MANUALLY The tumor cells are NEGATIVE for Her2 (1+). Estrogen Receptor: 80%, POSITIVE, WEAK STAINING INTENSITY Progesterone Receptor: 0%, NEGATIVE Proliferation Marker Ki67: 80%   07/18/2019 Breast MRI   Breast MRI 07/18/19  IMPRESSION: 1. Two morphologically abnormal level 1 left axillary lymph nodes correlating with biopsy-proven malignancy. 2. No MRI evidence of malignancy in either breast. 3. Postsurgical changes of the left breast consistent with prior lumpectomy.   07/25/2019 PET scan   PET 07/25/19  IMPRESSION: 1. Hypermetabolic LEFT axillary mass consistent with metastatic breast cancer. 2. High LEFT axillary/posterior triangle (level 5) lymph node consistent with local metastasis. 3. No central mediastinal nodal metastasis. No suspicious pulmonary nodules. 4. No distant soft tissue metastasis or skeletal metastasis. 5. Calcified fibroid uterus   08/05/2019 Surgery   LEFT AXILLARY LYMPH NODE DISSECTION by Dr. Brantley Stage 08/05/19    08/05/2019 Pathology Results   FINAL MICROSCOPIC DIAGNOSIS: 08/05/19    A. LYMPH NODES, LEFT AXILLARY, DISSECTION:  - Metastatic carcinoma.    - See comment.    08/2019 - 01/13/2020 Anti-estrogen oral therapy   Exemestane 1m starting 08/2019. Stopped with the start of chemo in 01/2020   09/01/2019 Genetic Testing   [employer] Genomic Findings:  -RET amplification -CDK8 amplification -NOTCH1 deletion  -TKD98splice site [id number]>N  10/53/9767Imaging   CT Angio Chest   IMPRESSION: 1. No definitive pulmonary embolism. 2. Multiple new small bilateral pulmonary nodules consistent with metastatic disease. 3. Soft tissue density in the left axilla at the site of node dissection, probably representing scarring but I cannot exclude tumor recurrence. 4. New partially healed fracture of the posterior aspect of the right eighth rib.   12/04/2019 - 01/13/2020 Chemotherapy   Verzenio 1571mBID starting 12/04/19. Stopped a week before the start of chemo in 01/2020   01/07/2020 PET scan   IMPRESSION: 1. Marked worsening of disease with soft tissue recurrence in the left axilla, new and enlarging lymph nodes in the supraclavicular/high axillary region, new mediastinal adenopathy and new pulmonary pleural and parenchymal disease. 2. Metastatic disease to the right hepatic lobe. 3. Signs of bony metastasis. 4. New pleural effusions presumably malignant and associated with pleural nodularity.   01/20/2020 -  Chemotherapy   Abraxane on days 1, 8, 15 q28 days; started 01/20/20      CURRENT THERAPY:   INTERVAL HISTORY: Ms. Reichard returns for f/u and treatment as scheduled. She was seen and completed C2D1 abraxane on 02/23/20. She underwent scheduled thoracentesis on 02/24/20. Path on pleural fluid is positive for malignant cells consistent with adenocarcinoma    REVIEW OF SYSTEMS:   Constitutional: Denies fevers, chills or abnormal weight loss Eyes: Denies blurriness of vision Ears, nose, mouth, throat, and face: Denies mucositis or sore throat Respiratory: Denies cough, dyspnea or wheezes Cardiovascular: Denies palpitation, chest  discomfort or lower extremity swelling Gastrointestinal:  Denies nausea, heartburn or change in bowel habits Skin: Denies abnormal skin rashes Lymphatics: Denies new lymphadenopathy or easy bruising Neurological:Denies numbness, tingling or new weaknesses Behavioral/Psych: Mood is stable, no new changes  All other systems were reviewed with the patient and are negative.  MEDICAL HISTORY:  Past Medical History:  Diagnosis Date  . Abnormal breast finding 2018   per pt/ having  a lot drainage from left breast nipple  . Anemia   . Anxiety   . Breast cancer ([place]) 11/2016   left/  . CKD (chronic kidney disease)   . Depression   . GERD (gastroesophageal reflux disease)    TUMS as needed  . HTN (hypertension)    states BP has been high recently; has been on med. x 20 yr.  . Hyperlipidemia   . Hyperplastic colon polyp   . HYPERTENSION, BENIGN SYSTEMIC 11/29/2006   Lisinopril hctz 10-12.'5mg'$ , amlodipine '10mg'$ , coreg '25mg'$  BID, clonidine 0.3 mg BID.   Marland Kitchen Insulin dependent diabetes mellitus   . Personal history of chemotherapy    2018/finished 6 weeks of chemo in Sep 2018  . Personal history of radiation therapy    2018 left breast/finished radiation in Sept 2018 per pt.    SURGICAL HISTORY: Past Surgical History:  Procedure Laterality Date  . AXILLARY LYMPH NODE DISSECTION Left 08/05/2019   Procedure: LEFT AXILLARY LYMPH NODE DISSECTION;  Surgeon: Erroll Luna, MD;  Location: [facility];  Service: General;  Laterality: Left;  . BREAST LUMPECTOMY Left 05/05/2008  . BREAST LUMPECTOMY Left 12/07/2016   malignant  . BREAST LUMPECTOMY WITH RADIOACTIVE SEED AND SENTINEL LYMPH NODE BIOPSY Left 12/07/2016   Procedure: LEFT BREAST LUMPECTOMY WITH RADIOACTIVE SEED AND SENTINEL LYMPH NODE BIOPSY;  Surgeon: Erroll Luna, MD;  Location: [facility];  Service: General;  Laterality: Left;  . IR FLUORO GUIDE PORT INSERTION RIGHT  01/04/2017  . IR US GUIDE VASC ACCESS RIGHT   01/04/2017  . PORT-A-CATH REMOVAL N/A 05/30/2017   Procedure: REMOVAL PORT-A-CATH;  Surgeon: Erroll Luna, MD;  Location: [facility];  Service: General;  Laterality: N/A;  . RE-EXCISION OF BREAST LUMPECTOMY Left 12/28/2016   Procedure: RE-EXCISION OF BREAST LUMPECTOMY;  Surgeon: Erroll Luna, MD;  Location: [facility];  Service: General;  Laterality: Left;    I have reviewed the social history and family history with the patient and they are unchanged from previous note.  ALLERGIES:  has No Known Allergies.  MEDICATIONS:  Current Outpatient Medications  Medication Sig Dispense Refill  . albuterol (VENTOLIN HFA) 108 (90 Base) MCG/ACT inhaler Inhale 2 puffs into the lungs every 6 (six) hours as needed for wheezing or shortness of breath. 18 g 2  . amLODipine (NORVASC) 10 MG tablet TAKE 1 TABLET BY MOUTH EVERY DAY (Patient taking differently: Take 10 mg by mouth daily. ) 90 tablet 1  .  B-D INS SYRINGE 0.5CC/31GX5/16 31G X 5/16" 0.5 ML MISC USE AS DIRECTED 4 TIMES A DAY 300 each 1  . busPIRone (BUSPAR) 10 MG tablet Take 1 tablet (10 mg total) by mouth 3 (three) times daily. 270 tablet 1  . carvedilol (COREG) 25 MG tablet Take 1 tablet (25 mg total) by mouth 2 (two) times daily with a meal. 180 tablet 3  . cloNIDine (CATAPRES) 0.3 MG tablet TAKE 1 TABLET (0.3 MG TOTAL) BY MOUTH 2 (TWO) TIMES DAILY. 180 tablet 1  . famotidine (PEPCID) 20 MG tablet TAKE 1 TABLET BY MOUTH EVERY DAY 30 tablet 1  . fluticasone (FLONASE) 50 MCG/ACT nasal spray Place 1 spray into both nostrils daily. 16 g 2  . fluticasone furoate-vilanterol (BREO ELLIPTA) 100-25 MCG/INH AEPB Inhale 1 puff into the lungs daily. 14 each 0  . fluticasone furoate-vilanterol (BREO ELLIPTA) 200-25 MCG/INH AEPB Inhale 1 puff into the lungs daily. 60 each 2  . furosemide (LASIX) 20 MG tablet Take 1 tablet (20 mg total) by mouth daily. 90 tablet 3  . HYDROcodone-acetaminophen (NORCO/VICODIN) 5-325 MG tablet Take 1 tablet by mouth  every 8 (eight) hours as needed for moderate pain. 15 tablet 0  . Insulin Glargine (LANTUS SOLOSTAR) 100 UNIT/ML Solostar Pen Inject 28 units into the skin in am (Patient taking differently: Inject 28 Units into the skin every morning. ) 10 pen 3  . Insulin Pen Needle 31G X 5 MM MISC Use pen needles for insulin injection daily 100 each 3  . insulin regular (NOVOLIN R) 100 units/mL injection Inject 0.14-0.18 mLs (14-18 Units total) into the skin 3 (three) times daily before meals. Relion. Please provide insulin syringes needed (Patient taking differently: Inject 10-16 Units into the skin See admin instructions. 16 units in the morning and 10 units at night) 20 mL 11  . Insulin Syringe-Needle U-100 (B-D INS SYRINGE 0.5CC/30GX1/2") 30G X 1/2" 0.5 ML MISC USE AS DIRECTED 4 TIMES A DAY 300 each 3  . lidocaine-prilocaine (EMLA) cream Apply to affected area once (Patient not taking: Reported on 02/17/2020) 30 g 3  . lisinopril (ZESTRIL) 20 MG tablet Take 1 tablet (20 mg total) by mouth daily. 90 tablet 3  . lovastatin (MEVACOR) 40 MG tablet TAKE 1 TABLET BY MOUTH EVERYDAY AT BEDTIME (Patient taking differently: Take 40 mg by mouth at bedtime. ) 90 tablet 3  . mirtazapine (REMERON) 7.5 MG tablet Take 1 tablet (7.5 mg total) by mouth at bedtime. 30 tablet 0  . montelukast (SINGULAIR) 10 MG tablet Take 1 tablet (10 mg total) by mouth at bedtime. 30 tablet 3  . ondansetron (ZOFRAN) 8 MG tablet Take 1 tablet (8 mg total) by mouth 2 (two) times daily as needed (Nausea or vomiting). 30 tablet 1  . OVER THE COUNTER MEDICATION Take 1 tablet by mouth daily as needed. Stool softner for constipation, also drinks warm prune juice prn    . polyethylene glycol powder (GLYCOLAX/MIRALAX) 17 GM/SCOOP powder Take 17 g by mouth daily. (Patient taking differently: Take 17 g by mouth as needed for mild constipation or moderate constipation. ) 1 g 0  . predniSONE (DELTASONE) 10 MG tablet Take 2 tabs x 5 days 10 tablet 0  . traMADol  (ULTRAM) 50 MG tablet TAKE 1 TABLET BY MOUTH EVERY 6 HOURS AS NEEDED FOR MODERATE/SEVERE PAIN (NECK AND SHOULDER PAIN). (Patient taking differently: Take 50 mg by mouth every 6 (six) hours as needed for moderate pain or severe pain (neck and shoulder pain). )  20 tablet 0   No current facility-administered medications for this visit.    PHYSICAL EXAMINATION: ECOG PERFORMANCE STATUS: {CHL ONC ECOG PS:[id number]}  There were no vitals filed for this visit. There were no vitals filed for this visit.  GENERAL:alert, no distress and comfortable SKIN: skin color, texture, turgor are normal, no rashes or significant lesions EYES: normal, Conjunctiva are pink and non-injected, sclera clear OROPHARYNX:no exudate, no erythema and lips, buccal mucosa, and tongue normal  NECK: supple, thyroid normal size, non-tender, without nodularity LYMPH:  no palpable lymphadenopathy in the cervical, axillary or inguinal LUNGS: clear to auscultation and percussion with normal breathing effort HEART: regular rate & rhythm and no murmurs and no lower extremity edema ABDOMEN:abdomen soft, non-tender and normal bowel sounds Musculoskeletal:no cyanosis of digits and no clubbing  NEURO: alert & oriented x 3 with fluent speech, no focal motor/sensory deficits  LABORATORY DATA:  I have reviewed the data as listed CBC Latest Ref Rng & Units 02/23/2020 02/09/2020 02/02/2020  WBC 4.0 - 10.5 K/uL 6.1 4.1 3.8(L)  Hemoglobin 12.0 - 15.0 g/dL 10.7(L) 10.5(L) 7.7(L)  Hematocrit 36.0 - 46.0 % 31.4(L) 30.7(L) 22.8(L)  Platelets 150 - 400 K/uL 272 266 296     CMP Latest Ref Rng & Units 02/23/2020 02/09/2020 02/02/2020  Glucose 70 - 99 mg/dL 300(H) 210(H) 282(H)  BUN 8 - 23 mg/dL 19 17 29(H)  Creatinine 0.44 - 1.00 mg/dL 1.50(H) 1.57(H) 1.89(H)  Sodium 135 - 145 mmol/L 139 141 138  Potassium 3.5 - 5.1 mmol/L 4.8 3.9 4.3  Chloride 98 - 111 mmol/L 105 107 107  CO2 22 - 32 mmol/L 25 22 21(L)  Calcium 8.9 - 10.3 mg/dL 9.1 8.6(L)  8.5(L)  Total Protein 6.5 - 8.1 g/dL 6.7 6.4(L) 6.6  Total Bilirubin 0.3 - 1.2 mg/dL 0.3 0.4 0.3  Alkaline Phos 38 - 126 U/L 106 79 81  AST 15 - 41 U/L 12(L) 10(L) 9(L)  ALT 0 - 44 U/L 6 <6 7      RADIOGRAPHIC STUDIES: I have personally reviewed the radiological images as listed and agreed with the findings in the report. No results found.   ASSESSMENT & PLAN:  No problem-specific Assessment & Plan notes found for this encounter.   No orders of the defined types were placed in this encounter.  All questions were answered. The patient knows to call the clinic with any problems, questions or concerns. No barriers to learning was detected. I spent {CHL ONC TIME VISIT - RFFMB:[id number]} counseling the patient face to face. The total time spent in the appointment was {CHL ONC TIME VISIT - SVXBL:[id number]} and more than 50% was on counseling and review of test results     Alla Feeling, NP 02/29/20

## 2020-03-01 ENCOUNTER — Other Ambulatory Visit: Payer: Self-pay

## 2020-03-01 ENCOUNTER — Encounter (HOSPITAL_COMMUNITY): Payer: Self-pay

## 2020-03-01 ENCOUNTER — Inpatient Hospital Stay (HOSPITAL_COMMUNITY)
Admission: EM | Admit: 2020-03-01 | Discharge: 2020-03-05 | DRG: 202 | Disposition: A | Discharge status: Home Health | Payer: Medicare Other | Attending: Internal Medicine | Admitting: Internal Medicine

## 2020-03-01 ENCOUNTER — Emergency Department (HOSPITAL_COMMUNITY): Payer: Medicare Other

## 2020-03-01 DIAGNOSIS — J9601 Acute respiratory failure with hypoxia: Secondary | ICD-10-CM | POA: Diagnosis present

## 2020-03-01 DIAGNOSIS — E669 Obesity, unspecified: Secondary | ICD-10-CM | POA: Diagnosis present

## 2020-03-01 DIAGNOSIS — Z8249 Family history of ischemic heart disease and other diseases of the circulatory system: Secondary | ICD-10-CM

## 2020-03-01 DIAGNOSIS — C7951 Secondary malignant neoplasm of bone: Secondary | ICD-10-CM | POA: Diagnosis present

## 2020-03-01 DIAGNOSIS — J45901 Unspecified asthma with (acute) exacerbation: Secondary | ICD-10-CM | POA: Diagnosis not present

## 2020-03-01 DIAGNOSIS — I890 Lymphedema, not elsewhere classified: Secondary | ICD-10-CM | POA: Diagnosis present

## 2020-03-01 DIAGNOSIS — I152 Hypertension secondary to endocrine disorders: Secondary | ICD-10-CM | POA: Diagnosis present

## 2020-03-01 DIAGNOSIS — R0600 Dyspnea, unspecified: Secondary | ICD-10-CM

## 2020-03-01 DIAGNOSIS — Z923 Personal history of irradiation: Secondary | ICD-10-CM

## 2020-03-01 DIAGNOSIS — R0602 Shortness of breath: Secondary | ICD-10-CM | POA: Diagnosis present

## 2020-03-01 DIAGNOSIS — R0689 Other abnormalities of breathing: Secondary | ICD-10-CM | POA: Diagnosis not present

## 2020-03-01 DIAGNOSIS — Z20822 Contact with and (suspected) exposure to covid-19: Secondary | ICD-10-CM | POA: Diagnosis present

## 2020-03-01 DIAGNOSIS — J910 Malignant pleural effusion: Secondary | ICD-10-CM | POA: Diagnosis present

## 2020-03-01 DIAGNOSIS — Z833 Family history of diabetes mellitus: Secondary | ICD-10-CM

## 2020-03-01 DIAGNOSIS — C7800 Secondary malignant neoplasm of unspecified lung: Secondary | ICD-10-CM | POA: Diagnosis present

## 2020-03-01 DIAGNOSIS — E1122 Type 2 diabetes mellitus with diabetic chronic kidney disease: Secondary | ICD-10-CM | POA: Diagnosis present

## 2020-03-01 DIAGNOSIS — K219 Gastro-esophageal reflux disease without esophagitis: Secondary | ICD-10-CM | POA: Diagnosis present

## 2020-03-01 DIAGNOSIS — Z794 Long term (current) use of insulin: Secondary | ICD-10-CM

## 2020-03-01 DIAGNOSIS — C787 Secondary malignant neoplasm of liver and intrahepatic bile duct: Secondary | ICD-10-CM | POA: Diagnosis not present

## 2020-03-01 DIAGNOSIS — N184 Chronic kidney disease, stage 4 (severe): Secondary | ICD-10-CM | POA: Diagnosis present

## 2020-03-01 DIAGNOSIS — R05 Cough: Secondary | ICD-10-CM | POA: Diagnosis not present

## 2020-03-01 DIAGNOSIS — N179 Acute kidney failure, unspecified: Secondary | ICD-10-CM | POA: Diagnosis present

## 2020-03-01 DIAGNOSIS — Z6832 Body mass index (BMI) 32.0-32.9, adult: Secondary | ICD-10-CM

## 2020-03-01 DIAGNOSIS — I10 Essential (primary) hypertension: Secondary | ICD-10-CM | POA: Diagnosis present

## 2020-03-01 DIAGNOSIS — I130 Hypertensive heart and chronic kidney disease with heart failure and stage 1 through stage 4 chronic kidney disease, or unspecified chronic kidney disease: Secondary | ICD-10-CM | POA: Diagnosis present

## 2020-03-01 DIAGNOSIS — Z79899 Other long term (current) drug therapy: Secondary | ICD-10-CM

## 2020-03-01 DIAGNOSIS — F329 Major depressive disorder, single episode, unspecified: Secondary | ICD-10-CM | POA: Diagnosis present

## 2020-03-01 DIAGNOSIS — F419 Anxiety disorder, unspecified: Secondary | ICD-10-CM | POA: Diagnosis present

## 2020-03-01 DIAGNOSIS — Z66 Do not resuscitate: Secondary | ICD-10-CM | POA: Diagnosis present

## 2020-03-01 DIAGNOSIS — Z9221 Personal history of antineoplastic chemotherapy: Secondary | ICD-10-CM

## 2020-03-01 DIAGNOSIS — Z170 Estrogen receptor positive status [ER+]: Secondary | ICD-10-CM

## 2020-03-01 DIAGNOSIS — Z87891 Personal history of nicotine dependence: Secondary | ICD-10-CM

## 2020-03-01 DIAGNOSIS — Z515 Encounter for palliative care: Secondary | ICD-10-CM | POA: Diagnosis not present

## 2020-03-01 DIAGNOSIS — E785 Hyperlipidemia, unspecified: Secondary | ICD-10-CM | POA: Diagnosis present

## 2020-03-01 DIAGNOSIS — Z86010 Personal history of colonic polyps: Secondary | ICD-10-CM

## 2020-03-01 DIAGNOSIS — E875 Hyperkalemia: Secondary | ICD-10-CM | POA: Diagnosis not present

## 2020-03-01 DIAGNOSIS — Z79891 Long term (current) use of opiate analgesic: Secondary | ICD-10-CM

## 2020-03-01 DIAGNOSIS — C50412 Malignant neoplasm of upper-outer quadrant of left female breast: Secondary | ICD-10-CM | POA: Diagnosis present

## 2020-03-01 DIAGNOSIS — C50919 Malignant neoplasm of unspecified site of unspecified female breast: Secondary | ICD-10-CM

## 2020-03-01 DIAGNOSIS — IMO0002 Reserved for concepts with insufficient information to code with codable children: Secondary | ICD-10-CM

## 2020-03-01 DIAGNOSIS — I5032 Chronic diastolic (congestive) heart failure: Secondary | ICD-10-CM | POA: Diagnosis present

## 2020-03-01 DIAGNOSIS — Z9889 Other specified postprocedural states: Secondary | ICD-10-CM

## 2020-03-01 DIAGNOSIS — E1165 Type 2 diabetes mellitus with hyperglycemia: Secondary | ICD-10-CM | POA: Diagnosis present

## 2020-03-01 DIAGNOSIS — Z7189 Other specified counseling: Secondary | ICD-10-CM

## 2020-03-01 DIAGNOSIS — E1169 Type 2 diabetes mellitus with other specified complication: Secondary | ICD-10-CM | POA: Diagnosis present

## 2020-03-01 DIAGNOSIS — Z825 Family history of asthma and other chronic lower respiratory diseases: Secondary | ICD-10-CM

## 2020-03-01 LAB — CBC
HCT: 37.9 % (ref 36.0–46.0)
Hemoglobin: 12.6 g/dL (ref 12.0–15.0)
MCH: 29.9 pg (ref 26.0–34.0)
MCHC: 33.2 g/dL (ref 30.0–36.0)
MCV: 90 fL (ref 80.0–100.0)
Platelets: 371 10*3/uL (ref 150–400)
RBC: 4.21 MIL/uL (ref 3.87–5.11)
RDW: 19.7 % — ABNORMAL HIGH (ref 11.5–15.5)
WBC: 7.3 10*3/uL (ref 4.0–10.5)
nRBC: 0.4 % — ABNORMAL HIGH (ref 0.0–0.2)

## 2020-03-01 LAB — BASIC METABOLIC PANEL
Anion gap: 11 (ref 5–15)
BUN: 15 mg/dL (ref 8–23)
CO2: 21 mmol/L — ABNORMAL LOW (ref 22–32)
Calcium: 9.4 mg/dL (ref 8.9–10.3)
Chloride: 110 mmol/L (ref 98–111)
Creatinine, Ser: 1.56 mg/dL — ABNORMAL HIGH (ref 0.44–1.00)
GFR calc Af Amer: 37 mL/min — ABNORMAL LOW (ref >60)
GFR calc non Af Amer: 31 mL/min — ABNORMAL LOW (ref >60)
Glucose, Bld: 164 mg/dL — ABNORMAL HIGH (ref 70–99)
Potassium: 4.3 mmol/L (ref 3.5–5.1)
Sodium: 142 mmol/L (ref 135–145)

## 2020-03-01 LAB — TROPONIN I (HIGH SENSITIVITY)
Troponin I (High Sensitivity): 22 ng/L — ABNORMAL HIGH (ref <18)
Troponin I (High Sensitivity): 19 ng/L — ABNORMAL HIGH (ref <18)

## 2020-03-01 LAB — CBG MONITORING, ED: Glucose-Capillary: 79 mg/dL (ref 70–99)

## 2020-03-01 MED ORDER — ALBUTEROL SULFATE HFA 108 (90 BASE) MCG/ACT IN AERS
6.0000 | INHALATION_SPRAY | RESPIRATORY_TRACT | Status: AC
  Administered 2020-03-01 (×2): 6 via RESPIRATORY_TRACT
  Filled 2020-03-01: qty 6.7

## 2020-03-01 MED ORDER — AEROCHAMBER PLUS FLO-VU LARGE MISC
Status: AC
  Administered 2020-03-01: 1
  Filled 2020-03-01: qty 1

## 2020-03-01 MED ORDER — SODIUM CHLORIDE 0.9% FLUSH: 3.0000 mL | Freq: Once | INTRAVENOUS | Status: DC

## 2020-03-01 NOTE — ED Triage Notes (Signed)
Per [facility] pt from home, c/o SOB x2 months, PCP dx her with asthma, prescribed her a inhaler, pt denies any relief w/inhaler. Pt reports having thoracentesis x2 days ago, continues to feel SOB   Clear upper lobes, diminished lobes with some accessory muscle usage   BP 118/72 HR 86 RR 26 97% RA 100% 2L [place]  CBG 168

## 2020-03-01 NOTE — ED Notes (Signed)
Pt's O2 stayed above 96% while ambulating. Pt reported mild shortness of breath.

## 2020-03-01 NOTE — ED Notes (Signed)
Wendy [name] son  [phone] please call son when ptar arrives her at [facility] so he could be there when she arrives

## 2020-03-01 NOTE — ED Notes (Signed)
Pt reports being diabetic and not eating since 3pm. Pt blood sugar 79 at this time.

## 2020-03-01 NOTE — ED Notes (Signed)
Additional attempt to gain larger IV access for CT angio, unable. EDP made aware of the same.

## 2020-03-02 ENCOUNTER — Observation Stay (HOSPITAL_BASED_OUTPATIENT_CLINIC_OR_DEPARTMENT_OTHER): Payer: Medicare Other

## 2020-03-02 ENCOUNTER — Encounter (HOSPITAL_COMMUNITY): Payer: Self-pay | Admitting: Radiology

## 2020-03-02 ENCOUNTER — Inpatient Hospital Stay: Payer: Medicare Other | Admitting: Nurse Practitioner

## 2020-03-02 ENCOUNTER — Observation Stay (HOSPITAL_COMMUNITY): Payer: Medicare Other

## 2020-03-02 ENCOUNTER — Inpatient Hospital Stay: Payer: Medicare Other

## 2020-03-02 ENCOUNTER — Other Ambulatory Visit: Payer: Self-pay

## 2020-03-02 ENCOUNTER — Emergency Department (HOSPITAL_COMMUNITY): Payer: Medicare Other

## 2020-03-02 DIAGNOSIS — R0600 Dyspnea, unspecified: Secondary | ICD-10-CM

## 2020-03-02 DIAGNOSIS — N184 Chronic kidney disease, stage 4 (severe): Secondary | ICD-10-CM | POA: Diagnosis not present

## 2020-03-02 DIAGNOSIS — C7951 Secondary malignant neoplasm of bone: Secondary | ICD-10-CM

## 2020-03-02 DIAGNOSIS — C50919 Malignant neoplasm of unspecified site of unspecified female breast: Secondary | ICD-10-CM | POA: Diagnosis not present

## 2020-03-02 DIAGNOSIS — I129 Hypertensive chronic kidney disease with stage 1 through stage 4 chronic kidney disease, or unspecified chronic kidney disease: Secondary | ICD-10-CM

## 2020-03-02 DIAGNOSIS — C50412 Malignant neoplasm of upper-outer quadrant of left female breast: Secondary | ICD-10-CM | POA: Diagnosis not present

## 2020-03-02 DIAGNOSIS — D259 Leiomyoma of uterus, unspecified: Secondary | ICD-10-CM | POA: Diagnosis not present

## 2020-03-02 DIAGNOSIS — Z7189 Other specified counseling: Secondary | ICD-10-CM

## 2020-03-02 DIAGNOSIS — C787 Secondary malignant neoplasm of liver and intrahepatic bile duct: Secondary | ICD-10-CM | POA: Diagnosis not present

## 2020-03-02 DIAGNOSIS — E1122 Type 2 diabetes mellitus with diabetic chronic kidney disease: Secondary | ICD-10-CM | POA: Diagnosis not present

## 2020-03-02 DIAGNOSIS — J910 Malignant pleural effusion: Secondary | ICD-10-CM | POA: Diagnosis not present

## 2020-03-02 DIAGNOSIS — R0609 Other forms of dyspnea: Secondary | ICD-10-CM

## 2020-03-02 DIAGNOSIS — J45901 Unspecified asthma with (acute) exacerbation: Principal | ICD-10-CM

## 2020-03-02 DIAGNOSIS — E785 Hyperlipidemia, unspecified: Secondary | ICD-10-CM

## 2020-03-02 DIAGNOSIS — Z170 Estrogen receptor positive status [ER+]: Secondary | ICD-10-CM | POA: Diagnosis not present

## 2020-03-02 DIAGNOSIS — C384 Malignant neoplasm of pleura: Secondary | ICD-10-CM | POA: Diagnosis not present

## 2020-03-02 DIAGNOSIS — J90 Pleural effusion, not elsewhere classified: Secondary | ICD-10-CM | POA: Diagnosis not present

## 2020-03-02 DIAGNOSIS — R0602 Shortness of breath: Secondary | ICD-10-CM

## 2020-03-02 DIAGNOSIS — R609 Edema, unspecified: Secondary | ICD-10-CM

## 2020-03-02 DIAGNOSIS — Z794 Long term (current) use of insulin: Secondary | ICD-10-CM | POA: Diagnosis not present

## 2020-03-02 DIAGNOSIS — E1159 Type 2 diabetes mellitus with other circulatory complications: Secondary | ICD-10-CM

## 2020-03-02 DIAGNOSIS — E1169 Type 2 diabetes mellitus with other specified complication: Secondary | ICD-10-CM

## 2020-03-02 DIAGNOSIS — I10 Essential (primary) hypertension: Secondary | ICD-10-CM

## 2020-03-02 HISTORY — PX: IR THORACENTESIS ASP PLEURAL SPACE W/IMG GUIDE: IMG5380

## 2020-03-02 LAB — HEMOGLOBIN A1C
Hgb A1c MFr Bld: 7.2 % — ABNORMAL HIGH (ref 4.8–5.6)
Mean Plasma Glucose: 159.94 mg/dL

## 2020-03-02 LAB — BODY FLUID CELL COUNT WITH DIFFERENTIAL
Eos, Fluid: 0 %
Lymphs, Fluid: 94 %
Monocyte-Macrophage-Serous Fluid: 6 % — ABNORMAL LOW (ref 50–90)
Neutrophil Count, Fluid: 0 % (ref 0–25)
Total Nucleated Cell Count, Fluid: 480 cu mm (ref 0–1000)

## 2020-03-02 LAB — BRAIN NATRIURETIC PEPTIDE: B Natriuretic Peptide: 41.3 pg/mL (ref 0.0–100.0)

## 2020-03-02 LAB — LACTATE DEHYDROGENASE, PLEURAL OR PERITONEAL FLUID: LD, Fluid: 650 U/L — ABNORMAL HIGH (ref 3–23)

## 2020-03-02 LAB — GLUCOSE, CAPILLARY
Glucose-Capillary: 204 mg/dL — ABNORMAL HIGH (ref 70–99)
Glucose-Capillary: 198 mg/dL — ABNORMAL HIGH (ref 70–99)
Glucose-Capillary: 363 mg/dL — ABNORMAL HIGH (ref 70–99)
Glucose-Capillary: 392 mg/dL — ABNORMAL HIGH (ref 70–99)

## 2020-03-02 LAB — LACTATE DEHYDROGENASE: LDH: 627 U/L — ABNORMAL HIGH (ref 98–192)

## 2020-03-02 LAB — GLUCOSE, PLEURAL OR PERITONEAL FLUID: Glucose, Fluid: 274 mg/dL

## 2020-03-02 LAB — ALBUMIN, PLEURAL OR PERITONEAL FLUID: Albumin, Fluid: 2 g/dL

## 2020-03-02 LAB — PROTEIN, PLEURAL OR PERITONEAL FLUID: Total protein, fluid: 3.2 g/dL

## 2020-03-02 LAB — CBG MONITORING, ED: Glucose-Capillary: 126 mg/dL — ABNORMAL HIGH (ref 70–99)

## 2020-03-02 LAB — PROTEIN, TOTAL: Total Protein: 6.7 g/dL (ref 6.5–8.1)

## 2020-03-02 LAB — SARS CORONAVIRUS 2 BY RT PCR (HOSPITAL ORDER, PERFORMED IN ~~LOC~~ HOSPITAL LAB): SARS Coronavirus 2: NEGATIVE

## 2020-03-02 MED ORDER — IOHEXOL 9 MG/ML PO SOLN
ORAL | Status: AC
  Administered 2020-03-02: 500 mL
  Filled 2020-03-02: qty 1000

## 2020-03-02 MED ORDER — CARVEDILOL 25 MG PO TABS
25.0000 mg | ORAL_TABLET | Freq: Two times a day (BID) | ORAL | Status: DC
  Administered 2020-03-02 – 2020-03-05 (×7): 25 mg via ORAL
  Filled 2020-03-02 (×7): qty 1

## 2020-03-02 MED ORDER — CLONIDINE HCL 0.2 MG PO TABS
0.3000 mg | ORAL_TABLET | Freq: Two times a day (BID) | ORAL | Status: DC
  Administered 2020-03-02 – 2020-03-05 (×6): 0.3 mg via ORAL
  Filled 2020-03-02 (×6): qty 1

## 2020-03-02 MED ORDER — IPRATROPIUM-ALBUTEROL 0.5-2.5 (3) MG/3ML IN SOLN
3.0000 mL | Freq: Two times a day (BID) | RESPIRATORY_TRACT | Status: DC
  Administered 2020-03-03 – 2020-03-05 (×5): 3 mL via RESPIRATORY_TRACT
  Filled 2020-03-02 (×5): qty 3

## 2020-03-02 MED ORDER — INSULIN ASPART 100 UNIT/ML ~~LOC~~ SOLN
0.0000 [IU] | Freq: Three times a day (TID) | SUBCUTANEOUS | Status: DC
  Administered 2020-03-02: 15 [IU] via SUBCUTANEOUS
  Administered 2020-03-02: 5 [IU] via SUBCUTANEOUS
  Administered 2020-03-02: 3 [IU] via SUBCUTANEOUS
  Administered 2020-03-03: 15 [IU] via SUBCUTANEOUS
  Administered 2020-03-03: 3 [IU] via SUBCUTANEOUS
  Administered 2020-03-03 – 2020-03-04 (×2): 11 [IU] via SUBCUTANEOUS
  Administered 2020-03-04: 5 [IU] via SUBCUTANEOUS
  Administered 2020-03-04: 8 [IU] via SUBCUTANEOUS
  Administered 2020-03-05: 11 [IU] via SUBCUTANEOUS
  Administered 2020-03-05: 5 [IU] via SUBCUTANEOUS

## 2020-03-02 MED ORDER — HEPARIN SODIUM (PORCINE) 5000 UNIT/ML IJ SOLN
5000.0000 [IU] | Freq: Three times a day (TID) | INTRAMUSCULAR | Status: DC
  Administered 2020-03-02: 5000 [IU] via SUBCUTANEOUS

## 2020-03-02 MED ORDER — FAMOTIDINE 20 MG PO TABS
20.0000 mg | ORAL_TABLET | Freq: Every day | ORAL | Status: DC
  Administered 2020-03-02 – 2020-03-05 (×4): 20 mg via ORAL
  Filled 2020-03-02 (×4): qty 1

## 2020-03-02 MED ORDER — MONTELUKAST SODIUM 10 MG PO TABS
10.0000 mg | ORAL_TABLET | Freq: Every day | ORAL | Status: DC
  Administered 2020-03-02 – 2020-03-04 (×3): 10 mg via ORAL
  Filled 2020-03-02 (×3): qty 1

## 2020-03-02 MED ORDER — MIRTAZAPINE 15 MG PO TABS
7.5000 mg | ORAL_TABLET | Freq: Every day | ORAL | Status: DC
  Administered 2020-03-02 – 2020-03-04 (×3): 7.5 mg via ORAL
  Filled 2020-03-02 (×3): qty 1

## 2020-03-02 MED ORDER — AMLODIPINE BESYLATE 10 MG PO TABS
10.0000 mg | ORAL_TABLET | Freq: Every day | ORAL | Status: DC
  Administered 2020-03-02 – 2020-03-05 (×4): 10 mg via ORAL
  Filled 2020-03-02 (×4): qty 1

## 2020-03-02 MED ORDER — METHYLPREDNISOLONE SODIUM SUCC 125 MG IJ SOLR
60.0000 mg | Freq: Four times a day (QID) | INTRAMUSCULAR | Status: DC
  Administered 2020-03-02 – 2020-03-03 (×4): 60 mg via INTRAVENOUS
  Filled 2020-03-02 (×4): qty 2

## 2020-03-02 MED ORDER — LISINOPRIL 20 MG PO TABS
20.0000 mg | ORAL_TABLET | Freq: Every day | ORAL | Status: DC
  Administered 2020-03-02 – 2020-03-03 (×2): 20 mg via ORAL
  Filled 2020-03-02 (×2): qty 1

## 2020-03-02 MED ORDER — METHYLPREDNISOLONE SODIUM SUCC 125 MG IJ SOLR
60.0000 mg | Freq: Once | INTRAMUSCULAR | Status: AC
  Administered 2020-03-02: 60 mg via INTRAVENOUS
  Filled 2020-03-02: qty 2

## 2020-03-02 MED ORDER — ONDANSETRON HCL 4 MG PO TABS: 8.0000 mg | ORAL_TABLET | Freq: Two times a day (BID) | ORAL | Status: DC | PRN

## 2020-03-02 MED ORDER — INSULIN GLARGINE 100 UNIT/ML ~~LOC~~ SOLN
28.0000 [IU] | Freq: Every day | SUBCUTANEOUS | Status: DC
  Administered 2020-03-02 – 2020-03-03 (×2): 28 [IU] via SUBCUTANEOUS
  Filled 2020-03-02 (×2): qty 0.28

## 2020-03-02 MED ORDER — ACETAMINOPHEN 650 MG RE SUPP: 650.0000 mg | Freq: Four times a day (QID) | RECTAL | Status: DC | PRN

## 2020-03-02 MED ORDER — ACETAMINOPHEN 325 MG PO TABS: 650.0000 mg | ORAL_TABLET | Freq: Four times a day (QID) | ORAL | Status: DC | PRN

## 2020-03-02 MED ORDER — ALBUTEROL (5 MG/ML) CONTINUOUS INHALATION SOLN
10.0000 mg/h | INHALATION_SOLUTION | Freq: Once | RESPIRATORY_TRACT | Status: AC
  Administered 2020-03-02: 10 mg/h via RESPIRATORY_TRACT
  Filled 2020-03-02: qty 20

## 2020-03-02 MED ORDER — LIDOCAINE HCL (PF) 1 % IJ SOLN
INTRAMUSCULAR | Status: DC | PRN
  Administered 2020-03-02: 5 mL

## 2020-03-02 MED ORDER — HYDROCODONE-ACETAMINOPHEN 5-325 MG PO TABS: 1.0000 | ORAL_TABLET | Freq: Three times a day (TID) | ORAL | Status: DC | PRN

## 2020-03-02 MED ORDER — ALBUTEROL SULFATE (2.5 MG/3ML) 0.083% IN NEBU
2.5000 mg | INHALATION_SOLUTION | RESPIRATORY_TRACT | Status: DC | PRN
  Administered 2020-03-04 (×2): 2.5 mg via RESPIRATORY_TRACT
  Filled 2020-03-02 (×2): qty 3

## 2020-03-02 MED ORDER — LIDOCAINE HCL 1 % IJ SOLN
INTRAMUSCULAR | Status: AC
  Filled 2020-03-02: qty 20

## 2020-03-02 MED ORDER — ALBUTEROL SULFATE HFA 108 (90 BASE) MCG/ACT IN AERS
2.0000 | INHALATION_SPRAY | RESPIRATORY_TRACT | Status: DC | PRN
  Filled 2020-03-02: qty 6.7

## 2020-03-02 MED ORDER — PRAVASTATIN SODIUM 40 MG PO TABS
40.0000 mg | ORAL_TABLET | Freq: Every day | ORAL | Status: DC
  Administered 2020-03-02 – 2020-03-04 (×3): 40 mg via ORAL
  Filled 2020-03-02 (×3): qty 1

## 2020-03-02 MED ORDER — FUROSEMIDE 20 MG PO TABS
20.0000 mg | ORAL_TABLET | Freq: Every day | ORAL | Status: DC
  Administered 2020-03-02 – 2020-03-03 (×2): 20 mg via ORAL
  Filled 2020-03-02 (×2): qty 1

## 2020-03-02 MED ORDER — BUSPIRONE HCL 5 MG PO TABS
10.0000 mg | ORAL_TABLET | Freq: Three times a day (TID) | ORAL | Status: DC
  Administered 2020-03-02 – 2020-03-05 (×11): 10 mg via ORAL
  Filled 2020-03-02 (×11): qty 2

## 2020-03-02 MED ORDER — IPRATROPIUM-ALBUTEROL 0.5-2.5 (3) MG/3ML IN SOLN
3.0000 mL | Freq: Four times a day (QID) | RESPIRATORY_TRACT | Status: DC
  Administered 2020-03-02 (×3): 3 mL via RESPIRATORY_TRACT
  Filled 2020-03-02 (×2): qty 3

## 2020-03-02 MED ORDER — BUDESONIDE 0.25 MG/2ML IN SUSP
0.2500 mg | Freq: Two times a day (BID) | RESPIRATORY_TRACT | Status: DC
  Administered 2020-03-02 – 2020-03-05 (×7): 0.25 mg via RESPIRATORY_TRACT
  Filled 2020-03-02 (×7): qty 2

## 2020-03-02 MED ORDER — ENOXAPARIN SODIUM 40 MG/0.4ML ~~LOC~~ SOLN: 40.0000 mg | SUBCUTANEOUS | Status: DC

## 2020-03-02 MED ORDER — INSULIN ASPART 100 UNIT/ML ~~LOC~~ SOLN
6.0000 [IU] | Freq: Once | SUBCUTANEOUS | Status: AC
  Administered 2020-03-02: 6 [IU] via SUBCUTANEOUS

## 2020-03-02 MED ORDER — POLYETHYLENE GLYCOL 3350 17 G PO PACK
17.0000 g | PACK | ORAL | Status: DC | PRN
  Administered 2020-03-02: 17 g via ORAL
  Filled 2020-03-02: qty 1

## 2020-03-02 MED ORDER — IOHEXOL 350 MG/ML SOLN
60.0000 mL | Freq: Once | INTRAVENOUS | Status: AC | PRN
  Administered 2020-03-02: 60 mL via INTRAVENOUS

## 2020-03-02 NOTE — Progress Notes (Signed)
HEMATOLOGY-ONCOLOGY PROGRESS NOTE  SUBJECTIVE: The patient was scheduled for next dose of chemotherapy in our office today.  However, Wendy [name] presented to the emergency room with shortness of breath.  CT angiogram of the chest was negative for PE, but did show evidence of disease progression particularly when the lungs, hila, and mediastinum consistent with her known history of breast cancer recurrence.  She was also noted to have bilateral pleural effusions, moderate on the right and small in the left and bony metastatic disease.  The patient had a left thoracentesis performed on 02/24/2020 and cytology showed malignant cells.  She had a right-sided thoracentesis performed earlier today and fluid has been sent for cytology.  She reports that her breathing is somewhat improved following the thoracentesis.  She reports that she has not really short of breath lying in bed or sitting, but she has increased dyspnea when she is walking.  However, she has not walked since the procedure.  She is not currently complaining of any pain.  She has no other complaints today.  Oncology History Overview Note  Cancer Staging Malignant neoplasm of upper-outer quadrant of left breast in female, estrogen receptor positive ([place]) Staging form: Breast, AJCC 8th Edition - Clinical: Stage IIB (cT2, cN0, cM0, G3, ER: Positive, PR: Negative, HER2: Negative) - Signed by [name], MD on 11/23/2016 - Pathologic stage from 12/28/2016: Stage IIB (pT2, pN1a(sn), cM0, G3, ER: Positive, PR: Negative, HER2: Negative) - Signed by [name], MD on 01/02/2017     Malignant neoplasm of upper-outer quadrant of left breast in female, estrogen receptor positive ([place])  11/01/2016 Mammogram   Diagnostic mammogram and ultrasound of left breast and axilla showed a 3.1 x 2.1 x 1.4 cm (3.3 x 2.0 x 2.7 cm by ultrasound) mass in the upper outer quadrant of the anterior third of the left breast, associated with pleomorphic calcification. There is a 8 mm  (1.8cm by [name]) prominent lymph node in the left axilla.   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   11/07/2016 Receptors her2   ER 80% positive, PR negative, HER-2 negative, Ki-67 90%   11/20/2016 Initial Diagnosis   Malignant neoplasm of upper-outer quadrant of left breast in female, estrogen receptor positive ([place])   12/07/2016 Surgery   Left lumpectomy and left axillary sentinel lymph node sampling by Dr. Brantley Stage   12/07/2016 Pathology Results   pT2, pN1 Left Lumpectomy: Grade 3 IDC measuring 3.4 cm, carcinoma broadly present at the superior margin. Grade 3 DCIS. 1 out of 2 left axillary SLN positive for metastatic carcinoma   12/07/2016 Miscellaneous   Mammaprint showed high risk disease, basal type    12/28/2016 Surgery   Re-excision of the previously positive superior margin was negative for malignant cells.    01/04/2017 Surgery   Port inserted   01/18/2017 - 03/22/2017 Chemotherapy   Adjuvant Docetaxel 75 mg/m and Cytoxan 600 mg/m, every 21 days, for total of 4 cycles, with Neulasta on day 2.    04/23/2017 - 05/18/2017 Radiation Therapy   Radiation treatment dates:   04/23/17 - 05/18/17 Administered by Dr. Lisbeth Renshaw  Site/dose:    Left breast/ 42.5 Gy in 17 Fx Boost / 7.5 Gy in 3 Fx   06/12/2017 - 08/14/2019 Anti-estrogen oral therapy   Adjuvant letrozole 1 mg daily, plan for 5-7 years. D/c on 08/14/19 due to local recurrence of left axilla.     07/20/2017  Mammogram   IMPRESSION: No mammographic evidence of malignancy in either breast. 3.8 cm left breast postsurgical loculated seroma.   09/12/2017 Survivorship     09/19/2018 Imaging   Baseline DEXA 09/19/18 ASSESSMENT: The BMD measured at Femur Neck Left is 0.888 g/cm2 with a T-score of -1.1. This patient is considered osteopenic according to [facility]) criteria.   The scan quality is good. L-4 was  excluded due to degenerative changes.   Site Region Measured Date Measured Age [age]A BMD Significant CHANGE T-score AP Spine  L1-L3      09/19/2018    77.2         -0.7    1.084 g/cm2   DualFemur Neck Left  09/19/2018    77.2         -1.1    0.888 g/cm2   DualFemur Total Mean 09/19/2018    77.2         -0.5    0.946 g/cm2 ASSESSMENT: The probability of a major osteoporotic fracture is 4.5 % within the next ten years.   The probability of hip fracture is 0.8  % within the next 10 years.   06/25/2019 Mammogram   Diagnostic mammogram 06/25/19  IMPRESSION: 1.  Two morphologically abnormal lymph nodes in the left axilla with a lymph node in the lower left axilla measuring 2.4 x 2 x 2.6 cm.   2. Stable lumpectomy changes left breast with no findings of malignancy in either breast.   07/01/2019 Pathology Results   Diagnosis Lymph node, needle/core biopsy, left axilla - POORLY DIFFERENTIATED CARCINOMA CONSISTENT WITH BREAST PRIMARY. - NO LYMPHOID NODAL TISSUE IDENTIFIED. - SEE MICROSCOPIC DESCRIPTION. Microscopic Comment The carcinoma is consistent with grade III.  PROGNOSTIC INDICATORS Results: IMMUNOHISTOCHEMICAL AND MORPHOMETRIC ANALYSIS PERFORMED MANUALLY The tumor cells are NEGATIVE for Her2 (1+). Estrogen Receptor: 80%, POSITIVE, WEAK STAINING INTENSITY Progesterone Receptor: 0%, NEGATIVE Proliferation Marker Ki67: 80%   07/18/2019 Breast MRI   Breast MRI 07/18/19  IMPRESSION: 1. Two morphologically abnormal level 1 left axillary lymph nodes correlating with biopsy-proven malignancy. 2. No MRI evidence of malignancy in either breast. 3. Postsurgical changes of the left breast consistent with prior lumpectomy.   07/25/2019 PET scan   PET 07/25/19  IMPRESSION: 1. Hypermetabolic LEFT axillary mass consistent with metastatic breast cancer. 2. High LEFT axillary/posterior triangle (level 5) lymph node consistent with local metastasis. 3. No central mediastinal nodal  metastasis. No suspicious pulmonary nodules. 4. No distant soft tissue metastasis or skeletal metastasis. 5. Calcified fibroid uterus   08/05/2019 Surgery   LEFT AXILLARY LYMPH NODE DISSECTION by Dr. Brantley Stage 08/05/19    08/05/2019 Pathology Results   FINAL MICROSCOPIC DIAGNOSIS: 08/05/19    A. LYMPH NODES, LEFT AXILLARY, DISSECTION:  - Metastatic carcinoma.  - See comment.    08/2019 - 01/13/2020 Anti-estrogen oral therapy   Exemestane '25mg'$  starting 08/2019. Stopped with the start of chemo in 01/2020   09/01/2019 Genetic Testing   [employer] Findings:  -RET amplification -CDK8 amplification -NOTCH1 deletion  -NI62 splice site [id number]   0/93/8182 Imaging   CT Angio Chest   IMPRESSION: 1. No definitive pulmonary embolism. 2. Multiple new small bilateral pulmonary nodules consistent with metastatic disease. 3. Soft tissue density in the left axilla at the site of node dissection, probably representing scarring but I cannot exclude tumor recurrence. 4. New partially healed fracture of the posterior aspect of the right eighth rib.   12/04/2019 - 01/13/2020 Chemotherapy   Verzenio '150mg'$  BID starting 12/04/19. Stopped  a week before the start of chemo in 01/2020   01/07/2020 PET scan   IMPRESSION: 1. Marked worsening of disease with soft tissue recurrence in the left axilla, new and enlarging lymph nodes in the supraclavicular/high axillary region, new mediastinal adenopathy and new pulmonary pleural and parenchymal disease. 2. Metastatic disease to the right hepatic lobe. 3. Signs of bony metastasis. 4. New pleural effusions presumably malignant and associated with pleural nodularity.   01/20/2020 -  Chemotherapy   Abraxane on days 1, 8, 15 q28 days; started 01/20/20       REVIEW OF SYSTEMS:   Constitutional: Denies fevers, chills Respiratory: Reports dyspnea with exertion Cardiovascular: Denies palpitation, chest discomfort Gastrointestinal:  Denies nausea,  heartburn or change in bowel habits Lymphatics: She notices increased lymphadenopathy in her left axilla Neurological:Denies numbness, tingling or new weaknesses Behavioral/Psych: Mood is stable, no new changes  Extremities: No lower extremity edema Breast: Reports that left breast has hardened. All other systems were reviewed with the patient and are negative.  I have reviewed the past medical history, past surgical history, social history and family history with the patient and they are unchanged from previous note.   PHYSICAL EXAMINATION: ECOG PERFORMANCE STATUS: 3 - Symptomatic, >50% confined to bed  Vitals:   03/02/20 1418 03/02/20 1503  BP: 139/60   Pulse: 83 86  Resp: 20 18  Temp: 98.9 F (37.2 C)   SpO2: 98% 96%   Filed Weights   03/01/20 2012 03/02/20 0620  Weight: 89.4 kg 81.2 kg    Intake/Output from previous day: No intake/output data recorded.  GENERAL:alert, no distress and comfortable LYMPH: Palpable adenopathy to the left axilla LUNGS: Diminished breath sounds bilaterally  NEURO: alert & oriented x 3 with fluent speech, no focal motor/sensory deficits  LABORATORY DATA:  I have reviewed the data as listed CMP Latest Ref Rng & Units 03/02/2020 03/01/2020 02/23/2020  Glucose 70 - 99 mg/dL - 164(H) 300(H)  BUN 8 - 23 mg/dL - 15 19  Creatinine 0.44 - 1.00 mg/dL - 1.56(H) 1.50(H)  Sodium 135 - 145 mmol/L - 142 139  Potassium 3.5 - 5.1 mmol/L - 4.3 4.8  Chloride 98 - 111 mmol/L - 110 105  CO2 22 - 32 mmol/L - 21(L) 25  Calcium 8.9 - 10.3 mg/dL - 9.4 9.1  Total Protein 6.5 - 8.1 g/dL 6.7 - 6.7  Total Bilirubin 0.3 - 1.2 mg/dL - - 0.3  Alkaline Phos 38 - 126 U/L - - 106  AST 15 - 41 U/L - - 12(L)  ALT 0 - 44 U/L - - 6    Lab Results  Component Value Date   WBC 7.3 03/01/2020   HGB 12.6 03/01/2020   HCT 37.9 03/01/2020   MCV 90.0 03/01/2020   PLT 371 03/01/2020   NEUTROABS 4.4 02/23/2020    CT ABDOMEN PELVIS WO CONTRAST  Result Date:  03/02/2020 CLINICAL DATA:  Abdominal distension. History of malignancy. Evaluate for possible bowel obstruction. EXAM: CT ABDOMEN AND PELVIS WITHOUT CONTRAST TECHNIQUE: Multidetector CT imaging of the abdomen and pelvis was performed following the standard protocol without IV contrast. COMPARISON:  Chest CT, same date and prior abdominal CT scan 01/17/2018 FINDINGS: Lower chest: There are bilateral pleural effusions and numerous small metastatic pulmonary nodules. The heart is normal in size. Small amount of pericardial fluid without overt effusion. Hepatobiliary: 3 cm segment 6 liver lesion again demonstrated. No other obvious hepatic lesions without contrast. The gallbladder appears normal. No intra or extrahepatic biliary dilatation.  Pancreas: Advanced pancreatic atrophy but no mass or inflammation. Spleen: Normal size.  No focal lesions. Adrenals/Urinary Tract: The adrenal glands are unremarkable. Some residual contrast in the collecting system of both kidneys from the recent chest CT. Prior numerous bilateral renal cysts but no worrisome renal lesions or hydronephrosis. The bladder contains contrast. No bladder mass or asymmetric bladder wall thickening. A mild to moderate cystocele is noted. Stomach/Bowel: The stomach, duodenum, small bowel and colon are unremarkable. No acute inflammatory changes, mass lesions or obstructive findings. The terminal ileum and appendix are normal. Contrast gets all the way to the colon and there are no dilated small bowel loops. Vascular/Lymphatic: Scattered atherosclerotic calcifications involving the aorta and iliac arteries but no aneurysm. No mesenteric or retroperitoneal mass or lymphadenopathy. Reproductive: Enlarged fibroid uterus with calcified fibroids. The ovaries are normal. Other: No abdominal wall hernia or subcutaneous lesions are identified. No free abdominal/pelvic fluid collections. Musculoskeletal: No significant bony findings. IMPRESSION: 1. No acute  abdominal/pelvic findings or lymphadenopathy. No findings for small bowel obstruction. 2. 3 cm segment 6 liver lesion consistent with metastatic disease. 3. Enlarged fibroid uterus with calcified fibroids. 4. Bilateral pleural effusions and numerous small metastatic pulmonary nodules. Aortic Atherosclerosis (ICD10-I70.0). Electronically Signed   By: Marijo Sanes M.D.   On: 03/02/2020 10:58   DG Chest 1 View  Result Date: 03/02/2020 CLINICAL DATA:  Status post thoracentesis. Additional history provided: Post right thoracentesis, patient still having some shortness of breath, no unusual pain. EXAM: CHEST  1 VIEW COMPARISON:  CT angiogram chest 10/03/2019, chest radiograph 03/01/2020. FINDINGS: Unchanged cardiomediastinal silhouette. Interval decrease in size of a right pleural effusion consistent with provided history of interval thoracentesis. Unchanged trace left pleural effusion. Multiple pulmonary nodules were better appreciated on chest CT performed earlier the same day. No evidence of pneumothorax. Surgical clips within the left axilla. Known osseous metastatic disease better appreciated on same-day chest CT. IMPRESSION: Interval decrease in conspicuity of a right pleural effusion consistent with interval thoracentesis. No evidence of pneumothorax. Additional findings without interval change as described. Electronically Signed   By: Kellie Simmering DO   On: 03/02/2020 14:12   DG Chest 2 View  Result Date: 03/01/2020 CLINICAL DATA:  Patient reports sob and cough with chest congestion for 3 months. Reports thoracentesis 2 days ago. EXAM: CHEST - 2 VIEW COMPARISON:  Chest radiograph 01/26/2020, 02/24/2020 FINDINGS: Stable cardiomediastinal contours. There is a small right pleural effusion with adjacent opacities possibly representing atelectasis. There is a small nonspecific opacity in the right mid lung which appears new from prior. The left lung is clear. No pneumothorax. No acute finding in the visualized  skeleton. IMPRESSION: 1. Small right pleural effusion with adjacent opacities possibly representing atelectasis. 2. New small focal opacity in the right mid lung is nonspecific, possibly atelectasis though early infection not excluded. Electronically Signed   By: Audie Pinto M.D.   On: 03/01/2020 17:50   CT Angio Chest PE W and/or Wo Contrast  Result Date: 03/02/2020 CLINICAL DATA:  Shortness of breath, history of recent thoracentesis EXAM: CT ANGIOGRAPHY CHEST WITH CONTRAST TECHNIQUE: Multidetector CT imaging of the chest was performed using the standard protocol during bolus administration of intravenous contrast. Multiplanar CT image reconstructions and MIPs were obtained to evaluate the vascular anatomy. CONTRAST:  86m OMNIPAQUE IOHEXOL 350 MG/ML SOLN COMPARISON:  Chest x-ray from the previous day, CT from 10/31/2019, PET-CT from 01/07/2020 FINDINGS: Cardiovascular: Thoracic aorta and its branches are well visualize without aneurysmal dilatation or dissection. No cardiac  enlargement is seen. Mild pericardial effusion is noted. The pulmonary artery shows a normal branching pattern. No definitive filling defect to suggest pulmonary embolism is seen. Mediastinum/Nodes: Thoracic inlet shows lymphadenopathy in the left supraclavicular region as well as posterior to the clavicle on the left. Scattered mediastinal lymph nodes are noted in the pre-vascular region as well as AP window slightly increased when compared with the prior exam. Additionally there are hilar lymph nodes identified as well as subcarinal adenopathy increased from the prior study. Subcarinal lymph node measures at least 2.1 cm in short axis and previously measured approximately 1 cm in short axis on prior PET-CT. Right hilar lymph node measures 2.1 cm in short axis also increased from the prior study. Significant bulky adenopathy is noted in the left axilla as well as a focal soft tissue mass lesion which measures 5.1 by 2.5 cm best seen  on image number 136 of series 7. This is consistent with recurrent disease and is similar to that seen on recent PET-CT. Bulky lymph nodes are noted associated with this mass which appear increased when compared with the prior exam consistent with progression of disease. Multiple subpectoral lymph nodes are noted on the left also consistent with progressive disease. Lungs/Pleura: Small left pleural effusion is noted consistent with the recent thoracentesis. Moderate right-sided pleural effusion is noted. The lungs demonstrate multiple parenchymal nodules which have increased in size and number when compared with the prior exam. Largest of these on the left is posterior along the pleural margin measuring 1.9 cm. The largest of these on the right is seen in the upper lobe measuring approximately 11 mm. Right lower lobe consolidation is noted of a mild degree. Upper Abdomen: Visualized upper abdomen demonstrates multiple renal cysts. Additionally there is a rounded hypodensity in the posterior aspect of the right lobe of the liver best seen on image number 354 of series 7 consistent with metastatic disease. This is stable from the prior PET-CT. Musculoskeletal: Degenerative changes of the thoracic spine are noted. Medial scapular metastatic lesion is noted on the left similar to that seen on prior PET-CT. Pathologic fracture is again noted involving the right eighth rib posteriorly. Some mottled areas of bony density are noted within the ribcage consistent with metastatic disease. Review of the MIP images confirms the above findings. IMPRESSION: No evidence of pulmonary emboli. Progression of disease particularly within the lungs, hila and mediastinum consistent with the known history of breast carcinoma with recurrence. Persistent left breast mass is noted with increasing left axillary and left chest wall lymphadenopathy. Bilateral pleural effusions right greater than left. Bony metastatic disease. Electronically  Signed   By: Inez Catalina M.D.   On: 03/02/2020 01:41   DG Chest Port 1 View  Result Date: 02/24/2020 CLINICAL DATA:  Post left thoracentesis EXAM: PORTABLE CHEST 1 VIEW COMPARISON:  01/26/2020 FINDINGS: No residual pleural effusion on the left. No complication or pneumothorax Small right pleural effusion slightly increased from the prior study. Mild right lower lobe atelectasis. Negative for heart failure or edema Left mastectomy with clips in the left axilla IMPRESSION: No significant left pleural effusion following thoracentesis. No pneumothorax Progression of small right effusion and right lower lobe atelectasis since 01/26/2020. Electronically Signed   By: Franchot Gallo M.D.   On: 02/24/2020 16:23   ECHOCARDIOGRAM COMPLETE  Result Date: 02/05/2020    ECHOCARDIOGRAM REPORT   Patient Name:   Wendy [name] Date of Exam: 02/05/2020 Medical Rec #:  [id number]    Height:  62.0 in Accession #:    [id number]   Weight:       201.0 lb Date of Birth:  [birth year]    BSA:          1.916 m Patient Age:    79 years     BP:           144/54 mmHg Patient Gender: F            HR:           70 bpm. Exam Location:  Outpatient Procedure: 2D Echo, Cardiac Doppler, Color Doppler and Strain Analysis Indications:    Dyspnea 786.09/ / R06.00  History:        Patient has prior history of Echocardiogram examinations, most                 recent 08/12/2018. Risk Factors:Hypertension, Diabetes and GERD.                 CKD.  Sonographer:    Jonelle Sidle Dance Referring Phys: [id number] [place]  1. Left ventricular ejection fraction, by estimation, is 60 to 65%. The left ventricle has normal function. The left ventricle has no regional wall motion abnormalities. There is mild left ventricular hypertrophy. Left ventricular diastolic parameters are consistent with Grade I diastolic dysfunction (impaired relaxation). The average left ventricular global longitudinal strain is -17.8 %.  2. Right ventricular systolic function  is normal. The right ventricular size is normal. There is mildly elevated pulmonary artery systolic pressure. The estimated right ventricular systolic pressure is 79.4 mmHg.  3. The pericardial effusion is circumferential. There is no evidence of cardiac tamponade. Moderate pleural effusion in the left lateral region.  4. The mitral valve is degenerative. Trivial mitral valve regurgitation. No evidence of mitral stenosis.  5. The aortic valve is normal in structure. Aortic valve regurgitation is not visualized. No aortic stenosis is present.  6. Pulmonic valve regurgitation is moderate.  7. The inferior vena cava is dilated in size with >50% respiratory variability, suggesting right atrial pressure of 8 mmHg. Comparison(s): No significant change from prior study. Prior images reviewed side by side. FINDINGS  Left Ventricle: Left ventricular ejection fraction, by estimation, is 60 to 65%. The left ventricle has normal function. The left ventricle has no regional wall motion abnormalities. The average left ventricular global longitudinal strain is -17.8 %. The left ventricular internal cavity size was normal in size. There is mild left ventricular hypertrophy. Left ventricular diastolic parameters are consistent with Grade I diastolic dysfunction (impaired relaxation). Right Ventricle: The right ventricular size is normal. No increase in right ventricular wall thickness. Right ventricular systolic function is normal. There is mildly elevated pulmonary artery systolic pressure. The tricuspid regurgitant velocity is 2.76  m/s, and with an assumed right atrial pressure of 8 mmHg, the estimated right ventricular systolic pressure is 80.1 mmHg. Left Atrium: Left atrial size was normal in size. Right Atrium: Right atrial size was normal in size. Pericardium: Trivial pericardial effusion is present. The pericardial effusion is circumferential. There is no evidence of cardiac tamponade. Mitral Valve: The mitral valve is  degenerative in appearance. There is mild thickening of the mitral valve leaflet(s). There is mild calcification of the mitral valve leaflet(s). Normal mobility of the mitral valve leaflets. Trivial mitral valve regurgitation. No evidence of mitral valve stenosis. Tricuspid Valve: The tricuspid valve is normal in structure. Tricuspid valve regurgitation is mild . No evidence of tricuspid stenosis. Aortic Valve: The aortic valve  is normal in structure. Aortic valve regurgitation is not visualized. No aortic stenosis is present. Pulmonic Valve: The pulmonic valve was normal in structure. Pulmonic valve regurgitation is moderate. No evidence of pulmonic stenosis. Aorta: The aortic root is normal in size and structure. Venous: The inferior vena cava is dilated in size with greater than 50% respiratory variability, suggesting right atrial pressure of 8 mmHg. IAS/Shunts: No atrial level shunt detected by color flow Doppler. Additional Comments: There is a moderate pleural effusion in the left lateral region.  LEFT VENTRICLE PLAX 2D LVIDd:         3.90 cm  Diastology LVIDs:         3.00 cm  LV e' lateral:   6.31 cm/s LV PW:         1.40 cm  LV E/e' lateral: 10.4 LV IVS:        1.10 cm  LV e' medial:    4.36 cm/s LVOT diam:     1.90 cm  LV E/e' medial:  15.0 LV SV:         63 LV SV Index:   33       2D Longitudinal Strain LVOT Area:     2.84 cm 2D Strain GLS Avg:     -17.8 %  RIGHT VENTRICLE             IVC RV Basal diam:  2.50 cm     IVC diam: 2.20 cm RV S prime:     12.30 cm/s TAPSE (M-mode): 1.4 cm LEFT ATRIUM             Index       RIGHT ATRIUM           Index LA diam:        4.20 cm 2.19 cm/m  RA Area:     12.00 cm LA Vol (A2C):   34.4 ml 17.96 ml/m RA Volume:   24.30 ml  12.68 ml/m LA Vol (A4C):   42.9 ml 22.39 ml/m LA Biplane Vol: 38.5 ml 20.10 ml/m  AORTIC VALVE LVOT Vmax:   92.40 cm/s LVOT Vmean:  58.300 cm/s LVOT VTI:    0.221 m  AORTA Ao Root diam: 3.50 cm Ao Asc diam:  2.80 cm MITRAL VALVE                TRICUSPID VALVE MV Area (PHT): 3.12 cm    TR Peak grad:   30.5 mmHg MV Decel Time: 243 msec    TR Vmax:        276.00 cm/s MV E velocity: 65.50 cm/s MV A velocity: 66.00 cm/s  SHUNTS MV E/A ratio:  0.99        Systemic VTI:  0.22 m                            Systemic Diam: 1.90 cm Candee Furbish MD Electronically signed by Candee Furbish MD Signature Date/Time: 02/05/2020/2:07:07 PM    Final    IR THORACENTESIS ASP PLEURAL SPACE W/IMG GUIDE  Result Date: 03/02/2020 INDICATION: SHORTNESS OF BREATH, RIGHT PLEURAL EFFUSION EXAM: ULTRASOUND GUIDED RIGHT THORACENTESIS MEDICATIONS: 1% lidocaine local COMPLICATIONS: None immediate. PROCEDURE: An ultrasound guided thoracentesis was thoroughly discussed with the patient and questions answered. The benefits, risks, alternatives and complications were also discussed. The patient understands and wishes to proceed with the procedure. Written consent was obtained. Ultrasound was performed to localize and mark an adequate pocket of fluid  in the right chest. The area was then prepped and draped in the normal sterile fashion. 1% Lidocaine was used for local anesthesia. Under ultrasound guidance a 6 Fr Safe-T-Centesis catheter was introduced. Thoracentesis was performed. The catheter was removed and a dressing applied. FINDINGS: A total of approximately 300 cc of blood tinged pleural fluid was removed. Samples were sent to the laboratory as requested by the clinical team. IMPRESSION: Successful ultrasound guided right thoracentesis yielding 300 cc of pleural fluid. Electronically Signed   By: Jerilynn Mages.  Shick M.D.   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 Interventional radiology asked to perform a therapeutic and diagnostic thoracentesis. EXAM: ULTRASOUND GUIDED LEFT THORACENTESIS MEDICATIONS: 1% lidocaine 10 mL COMPLICATIONS: None immediate. PROCEDURE:  An ultrasound guided thoracentesis was thoroughly discussed with the patient and questions answered. The benefits, risks, alternatives and complications were also discussed. The patient understands and wishes to proceed with the procedure. Written consent was obtained. Ultrasound was performed to localize and mark an adequate pocket of fluid in the left chest. The area was then prepped and draped in the normal sterile fashion. 1% Lidocaine was used for local anesthesia. Under ultrasound guidance a 6 Fr Safe-T-Centesis catheter was introduced. Thoracentesis was performed. The catheter was removed and a dressing applied. FINDINGS: A total of approximately 320 ml of amber fluid was removed. Samples were sent to the laboratory as requested by the clinical team. IMPRESSION: Successful ultrasound guided left thoracentesis yielding 320 ml of pleural fluid. Read by: Soyla Dryer, NP Electronically Signed   By: Jacqulynn Cadet M.D.   On: 02/24/2020 16:27    ASSESSMENT AND PLAN: 1.  Metastatic breast cancer 2.  Malignant pleural effusions 3.  Acute asthma exacerbation 4.  Hypertension 5.  Type 2 diabetes 6.  Hyperlipidemia 7.  CKD  -CT scan findings and cytology from thoracentesis on 02/24/2020 were discussed with the patient.  CT scan shows findings concerning for disease progression.  However, the patient has missed several doses of her chemotherapy secondary to work-up for dyspnea and now hospitalization.  Will discuss with Dr. Burr Medico recommendations for further treatment.  Agree with palliative care consult for discussion of goals of care and for assistance in management with dyspnea. -The patient has malignant pleural effusions.  She is status post thoracentesis x2 in about a 1 week period of time.  She has been seen by pulmonology and she may benefit from Pleurx catheter placement for recurrent pleural effusions.  We can refer her back to pulmonology for placement of a Pleurx catheter if needed.   LOS:  0 days   Mikey Bussing, DNP, AGPCNP-BC, AOCNP 03/02/20

## 2020-03-02 NOTE — ED Notes (Signed)
Pt given sandwich bag and water

## 2020-03-02 NOTE — Progress Notes (Signed)
Bilateral lower extremity venous duplex complete.  Difficult exam.  Please see CV Proc tab for preliminary results. Lita Mains- RDMS, RVT 5:20 PM  03/02/2020

## 2020-03-02 NOTE — Consult Note (Signed)
Consultation Note Date: 03/02/2020   Patient Name: Wendy [name]  DOB: [birth year]  MRN: [id number]  Age / Sex: 79 y.o., female  PCP: Wendy Olp, MD Referring Physician: [place] Wendy [name]  Reason for Consultation: Establishing goals of care  HPI/Patient Profile: 79 y.o. 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 Workup reveals pleural effusions and CT scans are concerning for progression of cancer. Palliative medicine consulted for discussion of goals of care given current findings.    Clinical Assessment and Goals of Care: Chart thoroughly reviewed. Discussed case with Wendy Dilling, NP with Oncology. Unclear if cancer has actually progressed through treatment or if patient just hasn't had enough treatments to affect cancer. Regardless, patient has ongoing issues that are affecting her ability to receive chemotherapy treatments as prescribed by her treatment plan. Plan for pleurex cath.  I met with patient. She was undergoing doppler exam for lower extremity pain to rule out DVT. She tells me she has been wishing and hoping this cancer would just go away.  I discussed with her the role of Palliative medicine and the need for advanced healthcare planning in light of her chronic/likely terminal illness. She shares she has been in discussion with her primary care parovider and Wendy [name] regarding these subjects and she agrees.  Wendy [name] has been living at home with her son- mostly independent with some assistance needed.  Wendy [name] would like  her son and daughter to be involved in our discussion.  She agreed to allow me to contact her son and daughter to arrange meeting.   Primary Decision Maker PATIENT    SUMMARY OF RECOMMENDATIONS -Full scope care -Full code- will discuss further with patient and family tomorrow -Meeting planned with family tomorrow at [place] -Will watch for further Oncology input regarding their feelings regarding further treatment plan -Symptom management: primary complaint is SOB-  -She would definitely benefit from pleurex  catheter placement by pulmonology  -Will discuss possible low dose morphine with patient and family      Code Status/Advance Care Planning:  Full code  Additional Recommendations (Limitations, Scope, Preferences):  Full Scope Treatment   Prognosis:    Unable to determine  Discharge Planning: To Be Determined  Primary Diagnoses: Present on Admission: . Hyperlipidemia associated with type 2 diabetes mellitus ([place]) . Hypertension associated with diabetes ([place])   I have reviewed the medical record, interviewed the patient and family, and examined the patient. The following aspects are pertinent.  Past Medical History:  Diagnosis Date  . Abnormal breast finding 2018   per pt/ having  a lot drainage from left breast nipple  . Anemia   . Anxiety   . Breast cancer (  Wendy [name]) 11/2016   left/  . CKD (chronic kidney disease)   . Depression   . GERD (gastroesophageal reflux disease)    TUMS as needed  . HTN (hypertension)    states BP has been high recently; has been on med. x 20 yr.  . Hyperlipidemia   . Hyperplastic colon polyp   . HYPERTENSION, BENIGN SYSTEMIC 11/29/2006   Lisinopril hctz 10-12.56m, amlodipine 141m coreg 2527mID, clonidine 0.3 mg BID.   . IMarland Kitchensulin dependent diabetes mellitus   . Personal history of chemotherapy    2018/finished 6 weeks of chemo in Sep 2018  . Personal history of radiation therapy    2018 left breast/finished radiation in Sept 2018 per  pt.   Social History   Socioeconomic History  . Marital status: Widowed    Spouse name: Not on file  . Number of children: 4  . Years of education: Not on file  . Highest education level: Not on file  Occupational History  . Occupation: DAY CARArmed forces operational officerEADiomedeRE  Tobacco Use  . Smoking status: Never Smoker  . Smokeless tobacco: Former UseSystems developer Types: Snuff  Substance and Sexual Activity  . Alcohol use: No    Alcohol/week: 0.0 standard drinks  . Drug use: No  . Sexual activity: Not Currently    Partners: Male  Other Topics Concern  . Not on file  Social History Narrative  . Not on file   Social Determinants of Health   Financial Resource Strain:   . Difficulty of Paying Living Expenses:   Food Insecurity:   . Worried About RunCharity fundraiser the Last Year:   . RanArboriculturist the Last Year:   Transportation Needs:   . LacFilm/video editoredical):   . LMarland Kitchenck of Transportation (Non-Medical):   Physical Activity:   . Days of Exercise per Week:   . Minutes of Exercise per Session:   Stress:   . Feeling of Stress :   Social Connections:   . Frequency of Communication with Friends and Family:   . Frequency of Social Gatherings with Friends and Family:   . Attends Religious Services:   . Active Member of Clubs or Organizations:   . Attends CluArchivistetings:   . MMarland Kitchenrital Status:    Family History  Problem Relation Age of Onset  . Diabetes Sister        x 4  . Heart failure Sister   . Diabetes Brother   . Heart disease Brother   . Diabetes Paternal Grandmother   . Diabetes Paternal Aunt   . Heart attack Brother   . Arthritis Sister   . Hypertension Sister   . Diabetes Sister   . Emphysema Brother   . Cancer Father 69 73    brain tumor   . Diabetes Child   . Asthma Child   . Hypertension Child   . Diabetes Child   . Diabetes Child   . Colon cancer Neg Hx   . Stomach cancer Neg Hx    Scheduled Meds: . amLODipine   10 mg Oral Daily  . budesonide (PULMICORT) nebulizer solution  0.25 mg Nebulization BID  . busPIRone  10 mg Oral TID  . carvedilol  25 mg Oral BID WC  . cloNIDine  0.3 mg Oral BID  . famotidine  20 mg Oral Daily  . furosemide  20 mg Oral Daily  . insulin  aspart  0-15 Units Subcutaneous TID WC  . insulin glargine  28 Units Subcutaneous Daily  . ipratropium-albuterol  3 mL Nebulization Q6H  . lidocaine      . lisinopril  20 mg Oral Daily  . methylPREDNISolone sodium succinate  60 mg Intravenous Q6H  . mirtazapine  7.5 mg Oral QHS  . montelukast  10 mg Oral QHS  . pravastatin  40 mg Oral q1800  . sodium chloride flush  3 mL Intravenous Once   Continuous Infusions: PRN Meds:.acetaminophen **OR** acetaminophen, albuterol, HYDROcodone-acetaminophen, lidocaine (PF), ondansetron, polyethylene glycol Medications Prior to Admission:  Prior to Admission medications   Medication Sig Start Date End Date Taking? Authorizing Provider  albuterol (VENTOLIN HFA) 108 (90 Base) MCG/ACT inhaler Inhale 2 puffs into the lungs every 6 (six) hours as needed for wheezing or shortness of breath. 10/21/19  Yes Wendy Olp, MD  amLODipine (NORVASC) 10 MG tablet TAKE 1 TABLET BY MOUTH EVERY DAY Patient taking differently: Take 10 mg by mouth daily.  09/22/19  Yes Wendy Olp, MD  busPIRone (BUSPAR) 10 MG tablet Take 1 tablet (10 mg total) by mouth 3 (three) times daily. 10/16/19  Yes Wendy Olp, MD  carvedilol (COREG) 25 MG tablet Take 1 tablet (25 mg total) by mouth 2 (two) times daily with a meal. 10/07/19  Yes Wendy Olp, MD  cloNIDine (CATAPRES) 0.3 MG tablet TAKE 1 TABLET (0.3 MG TOTAL) BY MOUTH 2 (TWO) TIMES DAILY. 02/16/20  Yes Wendy Olp, MD  famotidine (PEPCID) 20 MG tablet TAKE 1 TABLET BY MOUTH EVERY DAY Patient taking differently: Take 20 mg by mouth daily.  02/23/20  Yes Martyn Ehrich, NP  fluticasone (FLONASE) 50 MCG/ACT nasal spray Place 1 spray into both nostrils  daily. 01/30/20  Yes Martyn Ehrich, NP  fluticasone furoate-vilanterol (BREO ELLIPTA) 100-25 MCG/INH AEPB Inhale 1 puff into the lungs daily. 02/25/20  Yes Mannam, Praveen, MD  furosemide (LASIX) 20 MG tablet Take 1 tablet (20 mg total) by mouth daily. 02/17/20  Yes Wendy Olp, MD  HYDROcodone-acetaminophen (NORCO/VICODIN) 5-325 MG tablet Take 1 tablet by mouth every 8 (eight) hours as needed for moderate pain. 08/20/19  Yes [name], MD  Insulin Glargine (LANTUS SOLOSTAR) 100 UNIT/ML Solostar Pen Inject 28 units into the skin in am Patient taking differently: Inject 28 Units into the skin daily.  08/11/19  Yes Philemon Kingdom, MD  insulin regular (NOVOLIN R) 100 units/mL injection Inject 0.14-0.18 mLs (14-18 Units total) into the skin 3 (three) times daily before meals. Relion. Please provide insulin syringes needed Patient taking differently: Inject 10-16 Units into the skin See admin instructions. 16 units in the morning and 10 units at night 08/11/19  Yes Philemon Kingdom, MD  lisinopril (ZESTRIL) 20 MG tablet Take 1 tablet (20 mg total) by mouth daily. 02/17/20  Yes Wendy Olp, MD  lovastatin (MEVACOR) 40 MG tablet TAKE 1 TABLET BY MOUTH EVERYDAY AT BEDTIME Patient taking differently: Take 40 mg by mouth at bedtime.  09/22/19  Yes Wendy Olp, MD  mirtazapine (REMERON) 7.5 MG tablet Take 1 tablet (7.5 mg total) by mouth at bedtime. 01/08/20  Yes [name], MD  montelukast (SINGULAIR) 10 MG tablet Take 1 tablet (10 mg total) by mouth at bedtime. 01/30/20  Yes Martyn Ehrich, NP  ondansetron (ZOFRAN) 8 MG tablet Take 1 tablet (8 mg total) by mouth 2 (two) times daily as needed (Nausea or vomiting). 01/08/20  Yes [name], MD  polyethylene glycol powder (GLYCOLAX/MIRALAX) 17 GM/SCOOP powder Take 17 g by mouth daily. Patient taking differently: Take 17 g by mouth as needed for mild constipation or moderate constipation.  05/22/19  Yes Pyrtle, Lajuan Lines, MD  traMADol (ULTRAM) 50 MG  tablet TAKE 1 TABLET BY MOUTH EVERY 6 HOURS AS NEEDED FOR MODERATE/SEVERE PAIN (NECK AND SHOULDER PAIN). Patient taking differently: Take 50 mg by mouth every 6 (six) hours as needed for moderate pain or severe pain (neck and shoulder pain).  01/14/20  Yes Wendy Olp, MD  B-D INS SYRINGE 0.5CC/31GX5/16 31G X 5/16" 0.5 ML MISC USE AS DIRECTED 4 TIMES A DAY 08/18/16   Philemon Kingdom, MD  fluticasone furoate-vilanterol (BREO ELLIPTA) 200-25 MCG/INH AEPB Inhale 1 puff into the lungs daily. Patient not taking: Reported on 03/01/2020 02/25/20   Marshell Garfinkel, MD  Insulin Pen Needle 31G X 5 MM MISC Use pen needles for insulin injection daily 12/12/16   Philemon Kingdom, MD  Insulin Syringe-Needle U-100 (B-D INS SYRINGE 0.5CC/30GX1/2") 30G X 1/2" 0.5 ML MISC USE AS DIRECTED 4 TIMES A DAY 08/15/16   Philemon Kingdom, MD  lidocaine-prilocaine (EMLA) cream Apply to affected area once Patient not taking: Reported on 02/17/2020 01/08/20   [name], MD  predniSONE (DELTASONE) 10 MG tablet Take 2 tabs x 5 days Patient not taking: Reported on 03/01/2020 01/30/20   Martyn Ehrich, NP   No Known Allergies Review of Systems  Constitutional: Positive for activity change.  Respiratory: Positive for shortness of breath.     Physical Exam Vitals and nursing note reviewed.  Pulmonary:     Effort: Pulmonary effort is normal.  Neurological:     Mental Status: She is alert.  Psychiatric:        Mood and Affect: Mood normal.        Behavior: Behavior normal.     Vital Signs: BP 139/60 (BP Location: Right Arm)   Pulse 86   Temp 98.9 F (37.2 C) (Oral)   Resp 18   Ht _0  (1.575 m)   Wt 81.2 kg   LMP  (LMP Unknown)   SpO2 96%   BMI 32.74 kg/m  Pain Scale: 0-10   Pain Score: 0-No pain   SpO2: SpO2: 96 % O2 Device:SpO2: 96 % O2 Flow Rate: .O2 Flow Rate (L/min): 2 L/min  IO: Intake/output summary:   Intake/Output Summary (Last 24 hours) at 03/02/2020 1920 Last data filed at 03/02/2020  1600 Gross per 24 hour  Intake 0 ml  Output 450 ml  Net -450 ml    LBM: Last BM Date: 02/29/20 Baseline Weight: Weight: 89.4 kg Most recent weight: Weight: 81.2 kg     Palliative Assessment/Data: PPS: 50%     Thank you for this consult. Palliative medicine will continue to follow and assist as needed.   Time In: 1500 Time Out: 1610 Time Total: 70 mins Greater than 50%  of this time was spent counseling and coordinating care related to the above assessment and plan.  Signed by: Mariana Kaufman, AGNP-C Palliative Medicine    Please contact Palliative Medicine Team phone at [phone] for questions and concerns.  For individual provider: See Shea Evans

## 2020-03-02 NOTE — ED Notes (Signed)
Patient transported to CT 

## 2020-03-02 NOTE — Procedures (Signed)
Interventional Radiology Procedure Note  Procedure: S/P [name] RT THORACENTESIS  Complications: None  Estimated Blood Loss: MIN  Findings: 300CC REMOVED, LABS SENT

## 2020-03-02 NOTE — Progress Notes (Signed)
PROGRESS NOTE    Wendy [name]  TGG:[id number] DOB: 1941-09-18 DOA: 03/01/2020 PCP: Marin Olp, MD    Brief Narrative:  Wendy [name] is a 79 y.o. female with medical history significant of anemia, anxiety, depression, metastatic breast cancer currently on chemo, CKD, hypertension, hyperlipidemia, GERD, insulin-dependent diabetes presenting with complaints of shortness of breath.  Patient reports 45-month history of progressively worsening dyspnea on exertion.  She is also wheezing and having a dry cough.  States her doctor gave her Breo and albuterol which are not helping.  States she had fluid drained from her lungs recently but continues to feel short of breath.  Denies fevers or chest pain.  No other complaints.  Patient was seen by pulmonology on 5/26 and her dyspnea was felt to be related to asthma and pleural effusion from breast cancer recurrence. Patient underwent recent thoracentesis on 5/25 and 320 cc fluid was drained.  Cytology showing malignant cells consistent with metastatic adenocarcinoma.  ED Course: Afebrile.  Tachypneic with respiratory rate up to mid 20s but not hypoxic.  Placed on 2 L supplemental oxygen for comfort.  Labs showing no leukocytosis.  Creatinine 1.5, at baseline.  Initial high-sensitivity troponin 22, repeat stable 19.  EKG without acute ischemic changes.  SARS-CoV-2 PCR test negative.  CT angiogram chest negative for PE.  Showing progression of disease particularly within the lungs, hila, and mediastinum consistent with known history of breast carcinoma with recurrence.  Persistent left breast mass is noted with increasing left axillary and left chest wall lymphadenopathy.  Bilateral pleural effusions, moderate on the right and small on the left.  Bony metastatic disease.  Patient was given albuterol inhaler and continuous neb treatments, Solu-Medrol 60 mg.  TRH consulted for admission for further evaluation and treatment.   Assessment & Plan:     Active Problems:   Hyperlipidemia associated with type 2 diabetes mellitus ([place])   Hypertension associated with diabetes ([address])   Asthma exacerbation   Malignant pleural effusion   Metastatic breast cancer ([place])   Acute asthma exacerbation: Patient presenting with progressive shortness of breath with associated tachypnea and wheezing on physical exam. --Solu-Medrol 60 mg IV every 6 hours --Pulmicort nebs twice daily --DuoNebs every 6 hours, albuterol prn --Continue home Singulair --Continue supplemental oxygen, titrate to maintain SPO2 greater than 92%  Bilateral pleural effusion, malignant Follows with medical oncology, Dr. Burr Medico and pulmonology, Dr. Vaughan Browner.  Underwent left thoracentesis on 5/25 2021 with cytology revealing malignant cells consistent with metastatic adenocarcinoma.  CT angiogram chest on admission notable for bilateral pleural effusions with moderate on the right and small on the left.  Pleural effusion on the right seems to be increased in comparison to x-ray on 5/25 2021. --IR consult for right thoracentesis is likely contributing factor to her underlying shortness of breath. --Continue supplemental oxygen as above  Bilateral lower extremity edema --Check duplex ultrasound bilateral lower extremities to rule out DVT  Metastatic breast cancer Followed by medical oncology, Dr. Burr Medico.  Currently on chemotherapy, but has missed her last 2 doses now.  CT angiogram chest notable for progression of disease in the lungs, hilum, mediastinum with increased left axillary and left chest wall lymph nodes with associated bony and liver metastasis.  Last outpatient visit with Dr. Burr Medico on 02/09/2020 with treatment goals now palliative with discussions to consider further goals of care. --CT abdomen/pelvis without contrast: Pending --Palliative care consult for assistance with goals of care and medical decision making as her cancer seems to be  significantly progressed with ultimately  poor/grim prognosis.  Essential hypertension: --Continue amlodipine 10 mg p.o. daily, carvedilol 25 mg p.o. twice daily, furosemide 20 mg p.o. daily --We will continue home lisinopril for now given that she appears to be at her baseline --Monitor blood pressure closely  Hyperlipidemia: Continue pravastatin  Insulin-dependent type 2 diabetes mellitus --Hemoglobin A1c: Pending. --Continue Lantus 28 units subcutaneously daily --Some slight scale for further coverage  Chronic kidney disease 3B - 4 Baseline creatinine 1.5-2.0 past year with GFR 25-38.  --Creatinine 1.56 on presentation --Continue lisinopril as above, but cautious watching of renal function with low threshold for discontinuing lisinopril   DVT prophylaxis: SCDs, holding chemical DVT prophylaxis for anticipated IR thoracentesis today Code Status: Full code Family Communication: None present at bedside  Disposition Plan:  Status is: Observation  The patient remains OBS appropriate and will d/c before 2 midnights.  Dispo: The patient is from: Home              Anticipated d/c is to: Home              Anticipated d/c date is: 2 days              Patient currently is not medically stable to d/c.   Consultants:   Interventional radiology  Procedures:   Pending IR right thoracentesis  Antimicrobials:   None   Subjective: Patient seen and examined bedside, resting comfortably.  Continues with shortness of breath and wheezing, slightly improved following administration of neb treatments and IV steroids.  Discussed with patient regarding her progression of her metastatic disease process, states that she was supposed to have chemotherapy again today, now has missed 2 sessions.  Patient agreeable to proceed with thoracentesis on the right side to help with her underlying dyspnea, recently had thoracentesis on the left.  No other complaints or concerns at this time.  Denies headache, no visual changes, no chest pain, no  palpitations, no abdominal pain, no cough/congestion.  No acute events overnight per nursing staff.  Objective: Vitals:   03/02/20 0620 03/02/20 0859 03/02/20 0900 03/02/20 0935  BP:    131/71  Pulse:   99 72  Resp:   18 20  Temp:    98.5 F (36.9 C)  TempSrc:    Oral  SpO2:  98% 98% 100%  Weight: 81.2 kg     Height: 5\' 2"  (1.575 m)       Intake/Output Summary (Last 24 hours) at 03/02/2020 0945 Last data filed at 03/02/2020 0900 Gross per 24 hour  Intake 0 ml  Output 200 ml  Net -200 ml   Filed Weights   03/01/20 2012 03/02/20 0620  Weight: 89.4 kg 81.2 kg    Examination:  General exam: Appears calm and comfortable Respiratory system: Decreased breath sounds bilateral bases, right greater than left with diffuse wheezing with slightly increased respiratory effort, on 2 L nasal cannula oxygenating 100%. Cardiovascular system: S1 & S2 heard, tachycardic, regular rhythm. No JVD, murmurs, rubs, gallops or clicks.  Bilateral lower extremity 1+ edema, left lower extremity slightly asymmetric than right. Gastrointestinal system: Abdomen is nondistended, soft and nontender. No organomegaly or masses felt. Normal bowel sounds heard. Central nervous system: Alert and oriented. No focal neurological deficits. Extremities: Symmetric 5 x 5 power. Skin: No rashes, lesions or ulcers Psychiatry: Judgement and insight appear normal. Mood & affect appropriate.     Data Reviewed: I have personally reviewed following labs and imaging studies  CBC: Recent Labs  Lab 03/01/20 1731  WBC 7.3  HGB 12.6  HCT 37.9  MCV 90.0  PLT 570   Basic Metabolic Panel: Recent Labs  Lab 03/01/20 1731  NA 142  K 4.3  CL 110  CO2 21*  GLUCOSE 164*  BUN 15  CREATININE 1.56*  CALCIUM 9.4   GFR: Estimated Creatinine Clearance: 29.3 mL/min (A) (by C-G formula based on SCr of 1.56 mg/dL (H)). Liver Function Tests: No results for input(s): AST, ALT, ALKPHOS, BILITOT, PROT, ALBUMIN in the last 168  hours. No results for input(s): LIPASE, AMYLASE in the last 168 hours. No results for input(s): AMMONIA in the last 168 hours. Coagulation Profile: No results for input(s): INR, PROTIME in the last 168 hours. Cardiac Enzymes: No results for input(s): CKTOTAL, CKMB, CKMBINDEX, TROPONINI in the last 168 hours. BNP (last 3 results) Recent Labs    09/15/19 1552  PROBNP CANCELED   HbA1C: No results for input(s): HGBA1C in the last 72 hours. CBG: Recent Labs  Lab 03/01/20 2132 03/02/20 0313 03/02/20 0637  GLUCAP 79 126* 204*   Lipid Profile: No results for input(s): CHOL, HDL, LDLCALC, TRIG, CHOLHDL, LDLDIRECT in the last 72 hours. Thyroid Function Tests: No results for input(s): TSH, T4TOTAL, FREET4, T3FREE, THYROIDAB in the last 72 hours. Anemia Panel: No results for input(s): VITAMINB12, FOLATE, FERRITIN, TIBC, IRON, RETICCTPCT in the last 72 hours. Sepsis Labs: No results for input(s): PROCALCITON, LATICACIDVEN in the last 168 hours.  Recent Results (from the past 240 hour(s))  SARS Coronavirus 2 (TAT 6-24 hrs)     Status: None   Collection Time: 02/23/20  9:19 AM   Specimen: Nasopharyngeal Swab  Result Value Ref Range Status   SARS Coronavirus 2 NEGATIVE NEGATIVE Final    Comment: (NOTE) SARS-CoV-2 target nucleic acids are NOT DETECTED. The SARS-CoV-2 RNA is generally detectable in upper and lower respiratory specimens during the acute phase of infection. Negative results do not preclude SARS-CoV-2 infection, do not rule out co-infections with other pathogens, and should not be used as the sole basis for treatment or other patient management decisions. Negative results must be combined with clinical observations, patient history, and epidemiological information. The expected result is Negative. Fact Sheet for Patients: SugarRoll.be Fact Sheet for Healthcare Providers: https://www.woods-mathews.com/ This test is not yet  approved or cleared by the [place] FDA and  has been authorized for detection and/or diagnosis of SARS-CoV-2 by FDA under an Emergency Use Authorization (EUA). This EUA will remain  in effect (meaning this test can be used) for the duration of the COVID-19 declaration under Section 56 4(b)(1) of the Act, 21 U.S.C. section 360bbb-3(b)(1), unless the authorization is terminated or revoked sooner. Performed at [facility], [place] [address] [place], [place] [address]   Culture, body fluid-bottle     Status: None   Collection Time: 02/24/20  4:07 PM   Specimen: Fluid  Result Value Ref Range Status   Specimen Description FLUID PLEURAL  Final   Special Requests BOTTLES DRAWN AEROBIC AND ANAEROBIC  Final   Culture   Final    NO GROWTH 5 DAYS Performed at [facility], [place] [address] [place], [place] [address]    Report Status 02/29/2020 FINAL  Final  Gram stain     Status: None   Collection Time: 02/24/20  4:07 PM   Specimen: Fluid  Result Value Ref Range Status   Specimen Description FLUID PLEURAL  Final   Special Requests NONE  Final   Gram  Stain   Final    FEW WBC PRESENT, PREDOMINANTLY MONONUCLEAR NO ORGANISMS SEEN Performed at [facility], [place] [address], [place], [place] [address]    Report Status 02/25/2020 FINAL  Final  SARS Coronavirus 2 by RT PCR (hospital order, performed in [facility] hospital lab) Nasopharyngeal Nasopharyngeal Swab     Status: None   Collection Time: 03/02/20  3:34 AM   Specimen: Nasopharyngeal Swab  Result Value Ref Range Status   SARS Coronavirus 2 NEGATIVE NEGATIVE Final    Comment: (NOTE) SARS-CoV-2 target nucleic acids are NOT DETECTED. The SARS-CoV-2 RNA is generally detectable in upper and lower respiratory specimens during the acute phase of infection. The lowest concentration of SARS-CoV-2 viral copies this assay can detect is 250 copies / mL. A negative result does not preclude SARS-CoV-2 infection and should not  be used as the sole basis for treatment or other patient management decisions.  A negative result may occur with improper specimen collection / handling, submission of specimen other than nasopharyngeal swab, presence of viral mutation(s) within the areas targeted by this assay, and inadequate number of viral copies (<250 copies / mL). A negative result must be combined with clinical observations, patient history, and epidemiological information. Fact Sheet for Patients:   StrictlyIdeas.no Fact Sheet for Healthcare Providers: BankingDealers.co.za This test is not yet approved or cleared  by the [place] FDA and has been authorized for detection and/or diagnosis of SARS-CoV-2 by FDA under an Emergency Use Authorization (EUA).  This EUA will remain in effect (meaning this test can be used) for the duration of the COVID-19 declaration under Section 564(b)(1) of the Act, 21 U.S.C. section 360bbb-3(b)(1), unless the authorization is terminated or revoked sooner. Performed at [facility], [place] [address] [place], [place] [address]          Radiology Studies: DG Chest 2 View  Result Date: 03/01/2020 CLINICAL DATA:  Patient reports sob and cough with chest congestion for 3 months. Reports thoracentesis 2 days ago. EXAM: CHEST - 2 VIEW COMPARISON:  Chest radiograph 01/26/2020, 02/24/2020 FINDINGS: Stable cardiomediastinal contours. There is a small right pleural effusion with adjacent opacities possibly representing atelectasis. There is a small nonspecific opacity in the right mid lung which appears new from prior. The left lung is clear. No pneumothorax. No acute finding in the visualized skeleton. IMPRESSION: 1. Small right pleural effusion with adjacent opacities possibly representing atelectasis. 2. New small focal opacity in the right mid lung is nonspecific, possibly atelectasis though early infection not excluded. Electronically  Signed   By: Audie Pinto M.D.   On: 03/01/2020 17:50   CT Angio Chest PE W and/or Wo Contrast  Result Date: 03/02/2020 CLINICAL DATA:  Shortness of breath, history of recent thoracentesis EXAM: CT ANGIOGRAPHY CHEST WITH CONTRAST TECHNIQUE: Multidetector CT imaging of the chest was performed using the standard protocol during bolus administration of intravenous contrast. Multiplanar CT image reconstructions and MIPs were obtained to evaluate the vascular anatomy. CONTRAST:  20mL OMNIPAQUE IOHEXOL 350 MG/ML SOLN COMPARISON:  Chest x-ray from the previous day, CT from 10/31/2019, PET-CT from 01/07/2020 FINDINGS: Cardiovascular: Thoracic aorta and its branches are well visualize without aneurysmal dilatation or dissection. No cardiac enlargement is seen. Mild pericardial effusion is noted. The pulmonary artery shows a normal branching pattern. No definitive filling defect to suggest pulmonary embolism is seen. Mediastinum/Nodes: Thoracic inlet shows lymphadenopathy in the left supraclavicular region as well as posterior to the clavicle on the left. Scattered mediastinal  lymph nodes are noted in the pre-vascular region as well as AP window slightly increased when compared with the prior exam. Additionally there are hilar lymph nodes identified as well as subcarinal adenopathy increased from the prior study. Subcarinal lymph node measures at least 2.1 cm in short axis and previously measured approximately 1 cm in short axis on prior PET-CT. Right hilar lymph node measures 2.1 cm in short axis also increased from the prior study. Significant bulky adenopathy is noted in the left axilla as well as a focal soft tissue mass lesion which measures 5.1 by 2.5 cm best seen on image number 136 of series 7. This is consistent with recurrent disease and is similar to that seen on recent PET-CT. Bulky lymph nodes are noted associated with this mass which appear increased when compared with the prior exam consistent with  progression of disease. Multiple subpectoral lymph nodes are noted on the left also consistent with progressive disease. Lungs/Pleura: Small left pleural effusion is noted consistent with the recent thoracentesis. Moderate right-sided pleural effusion is noted. The lungs demonstrate multiple parenchymal nodules which have increased in size and number when compared with the prior exam. Largest of these on the left is posterior along the pleural margin measuring 1.9 cm. The largest of these on the right is seen in the upper lobe measuring approximately 11 mm. Right lower lobe consolidation is noted of a mild degree. Upper Abdomen: Visualized upper abdomen demonstrates multiple renal cysts. Additionally there is a rounded hypodensity in the posterior aspect of the right lobe of the liver best seen on image number 354 of series 7 consistent with metastatic disease. This is stable from the prior PET-CT. Musculoskeletal: Degenerative changes of the thoracic spine are noted. Medial scapular metastatic lesion is noted on the left similar to that seen on prior PET-CT. Pathologic fracture is again noted involving the right eighth rib posteriorly. Some mottled areas of bony density are noted within the ribcage consistent with metastatic disease. Review of the MIP images confirms the above findings. IMPRESSION: No evidence of pulmonary emboli. Progression of disease particularly within the lungs, hila and mediastinum consistent with the known history of breast carcinoma with recurrence. Persistent left breast mass is noted with increasing left axillary and left chest wall lymphadenopathy. Bilateral pleural effusions right greater than left. Bony metastatic disease. Electronically Signed   By: Inez Catalina M.D.   On: 03/02/2020 01:41        Scheduled Meds: . amLODipine  10 mg Oral Daily  . budesonide (PULMICORT) nebulizer solution  0.25 mg Nebulization BID  . busPIRone  10 mg Oral TID  . carvedilol  25 mg Oral BID WC    . cloNIDine  0.3 mg Oral BID  . famotidine  20 mg Oral Daily  . furosemide  20 mg Oral Daily  . insulin aspart  0-15 Units Subcutaneous TID WC  . insulin glargine  28 Units Subcutaneous Daily  . ipratropium-albuterol  3 mL Nebulization Q6H  . lisinopril  20 mg Oral Daily  . methylPREDNISolone sodium succinate  60 mg Intravenous Q6H  . mirtazapine  7.5 mg Oral QHS  . montelukast  10 mg Oral QHS  . pravastatin  40 mg Oral q1800  . sodium chloride flush  3 mL Intravenous Once   Continuous Infusions:   LOS: 0 days    Time spent: 36 minutes spent on chart review, discussion with nursing staff, consultants, updating family and interview/physical exam; more than 50% of that time was spent  in counseling and/or coordination of care.    Lilie Vezina J [place] DO [facility] Available via Epic secure chat 7am-7pm After these hours, please refer to coverage provider listed on [email or web address] 03/02/2020, 9:45 AM

## 2020-03-02 NOTE — ED Provider Notes (Addendum)
Patient signed out to me by Dr. Kathrynn Humble to follow-up on CT scan.  Patient presented with shortness of breath.  Noted to have significant bronchospasm on arrival.  She did have some improvement with albuterol initially.  CT angiography performed.  No evidence of PE noted, there is evidence of progression of her cancer.  Upon reevaluation after CT scan, patient with audible wheezing and very diminished air movement.  She is tachypneic.  She is maintaining oxygen saturations on nasal cannula oxygen.  Due to the amount of bronchospasm, will require hospitalization for further management.   EKG Interpretation  Date/Time:  Monday Mar 01 2020 17:19:58 EDT Ventricular Rate:  87 PR Interval:  198 QRS Duration: 72 QT Interval:  342 QTC Calculation: 411 R Axis:   53 Text Interpretation: Normal sinus rhythm Normal ECG Confirmed by Orpah Greek [phone]) on 03/02/2020 4:14:31 AM        Angiocath insertion Performed by: Orpah Greek  Consent: Verbal consent obtained. Risks and benefits: risks, benefits and alternatives were discussed Time out: Immediately prior to procedure a "time out" was called to verify the correct patient, procedure, equipment, support staff and site/side marked as required.  Preparation: Patient was prepped and draped in the usual sterile fashion.  Vein Location: R antecub    Ultrasound Guided  Gauge: 20  Normal blood return and flush without difficulty Patient tolerance: Patient tolerated the procedure well with no immediate complications.      Orpah Greek, MD 03/02/20 3888    Orpah Greek, MD 03/02/20 2800    Orpah Greek, MD 03/02/20 [phone]

## 2020-03-02 NOTE — ED Notes (Signed)
Pt placed on 2L O2 per [place]

## 2020-03-02 NOTE — H&P (Addendum)
History and Physical    Wendy [name] DOB: 27-Apr-1941 DOA: 03/01/2020  PCP: Marin Olp, MD Patient coming from: Home  Chief Complaint: Shortness of breath  HPI: Wendy [name] is a 79 y.o. female with medical history significant of anemia, anxiety, depression, metastatic breast cancer currently on chemo, CKD, hypertension, hyperlipidemia, GERD, insulin-dependent diabetes presenting with complaints of shortness of breath.  Patient reports 62-month history of progressively worsening dyspnea on exertion.  She is also wheezing and having a dry cough.  States her doctor gave her Breo and albuterol which are not helping.  States she had fluid drained from her lungs recently but continues to feel short of breath.  Denies fevers or chest pain.  No other complaints.  Of note, patient was seen by pulmonology on 5/26 and her dyspnea was felt to be related to asthma and pleural effusion from breast cancer recurrence.  Patient underwent recent thoracentesis on 5/25 and 320 cc fluid was drained.  Cytology showing malignant cells consistent with metastatic adenocarcinoma.  ED Course: Afebrile.  Tachypneic with respiratory rate up to mid 20s but not hypoxic.  Placed on 2 L supplemental oxygen for comfort.  Labs showing no leukocytosis.  Creatinine 1.5, at baseline.  Initial high-sensitivity troponin 22, repeat stable 19.  EKG without acute ischemic changes.  SARS-CoV-2 PCR test negative.  CT angiogram chest negative for PE.  Showing progression of disease particularly within the lungs, hila, and mediastinum consistent with known history of breast carcinoma with recurrence.  Persistent left breast mass is noted with increasing left axillary and left chest wall lymphadenopathy.  Bilateral pleural effusions, moderate on the right and small on the left.  Bony metastatic disease.  Patient was given albuterol inhaler and continuous neb treatments, Solu-Medrol 60 mg.  Review of Systems:  All systems  reviewed and apart from history of presenting illness, are negative.  Past Medical History:  Diagnosis Date  . Abnormal breast finding 2018   per pt/ having  a lot drainage from left breast nipple  . Anemia   . Anxiety   . Breast cancer ([place]) 11/2016   left/  . CKD (chronic kidney disease)   . Depression   . GERD (gastroesophageal reflux disease)    TUMS as needed  . HTN (hypertension)    states BP has been high recently; has been on med. x 20 yr.  . Hyperlipidemia   . Hyperplastic colon polyp   . HYPERTENSION, BENIGN SYSTEMIC 11/29/2006   Lisinopril hctz 10-12.5mg , amlodipine 10mg , coreg 25mg  BID, clonidine 0.3 mg BID.   Marland Kitchen Insulin dependent diabetes mellitus   . Personal history of chemotherapy    2018/finished 6 weeks of chemo in Sep 2018  . Personal history of radiation therapy    2018 left breast/finished radiation in Sept 2018 per pt.    Past Surgical History:  Procedure Laterality Date  . AXILLARY LYMPH NODE DISSECTION Left 08/05/2019   Procedure: LEFT AXILLARY LYMPH NODE DISSECTION;  Surgeon: Erroll Luna, MD;  Location: [facility];  Service: General;  Laterality: Left;  . BREAST LUMPECTOMY Left 05/05/2008  . BREAST LUMPECTOMY Left 12/07/2016   malignant  . BREAST LUMPECTOMY WITH RADIOACTIVE SEED AND SENTINEL LYMPH NODE BIOPSY Left 12/07/2016   Procedure: LEFT BREAST LUMPECTOMY WITH RADIOACTIVE SEED AND SENTINEL LYMPH NODE BIOPSY;  Surgeon: Erroll Luna, MD;  Location: [place];  Service: General;  Laterality: Left;  . IR FLUORO GUIDE PORT INSERTION RIGHT  01/04/2017  . IR US  GUIDE VASC ACCESS RIGHT  01/04/2017  . PORT-A-CATH REMOVAL N/A 05/30/2017   Procedure: REMOVAL PORT-A-CATH;  Surgeon: Erroll Luna, MD;  Location: [facility];  Service: General;  Laterality: N/A;  . RE-EXCISION OF BREAST LUMPECTOMY Left 12/28/2016   Procedure: RE-EXCISION OF BREAST LUMPECTOMY;  Surgeon: Erroll Luna, MD;  Location: [place];  Service:  General;  Laterality: Left;     reports that she has never smoked. She quit smokeless tobacco use about 2 years ago.  Her smokeless tobacco use included snuff. She reports that she does not drink alcohol or use drugs.  No Known Allergies  Family History  Problem Relation Age of Onset  . Diabetes Sister        x 4  . Heart failure Sister   . Diabetes Brother   . Heart disease Brother   . Diabetes Paternal Grandmother   . Diabetes Paternal Aunt   . Heart attack Brother   . Arthritis Sister   . Hypertension Sister   . Diabetes Sister   . Emphysema Brother   . Cancer Father 42       brain tumor   . Diabetes Child   . Asthma Child   . Hypertension Child   . Diabetes Child   . Diabetes Child   . Colon cancer Neg Hx   . Stomach cancer Neg Hx     Prior to Admission medications   Medication Sig Start Date End Date Taking? Authorizing Provider  albuterol (VENTOLIN HFA) 108 (90 Base) MCG/ACT inhaler Inhale 2 puffs into the lungs every 6 (six) hours as needed for wheezing or shortness of breath. 10/21/19  Yes Marin Olp, MD  amLODipine (NORVASC) 10 MG tablet TAKE 1 TABLET BY MOUTH EVERY DAY Patient taking differently: Take 10 mg by mouth daily.  09/22/19  Yes Marin Olp, MD  busPIRone (BUSPAR) 10 MG tablet Take 1 tablet (10 mg total) by mouth 3 (three) times daily. 10/16/19  Yes Marin Olp, MD  carvedilol (COREG) 25 MG tablet Take 1 tablet (25 mg total) by mouth 2 (two) times daily with a meal. 10/07/19  Yes Marin Olp, MD  cloNIDine (CATAPRES) 0.3 MG tablet TAKE 1 TABLET (0.3 MG TOTAL) BY MOUTH 2 (TWO) TIMES DAILY. 02/16/20  Yes Marin Olp, MD  famotidine (PEPCID) 20 MG tablet TAKE 1 TABLET BY MOUTH EVERY DAY Patient taking differently: Take 20 mg by mouth daily.  02/23/20  Yes Martyn Ehrich, NP  fluticasone (FLONASE) 50 MCG/ACT nasal spray Place 1 spray into both nostrils daily. 01/30/20  Yes Martyn Ehrich, NP  fluticasone furoate-vilanterol  (BREO ELLIPTA) 100-25 MCG/INH AEPB Inhale 1 puff into the lungs daily. 02/25/20  Yes Mannam, Praveen, MD  furosemide (LASIX) 20 MG tablet Take 1 tablet (20 mg total) by mouth daily. 02/17/20  Yes Marin Olp, MD  HYDROcodone-acetaminophen (NORCO/VICODIN) 5-325 MG tablet Take 1 tablet by mouth every 8 (eight) hours as needed for moderate pain. 08/20/19  Yes [name], MD  Insulin Glargine (LANTUS SOLOSTAR) 100 UNIT/ML Solostar Pen Inject 28 units into the skin in am Patient taking differently: Inject 28 Units into the skin daily.  08/11/19  Yes Philemon Kingdom, MD  insulin regular (NOVOLIN R) 100 units/mL injection Inject 0.14-0.18 mLs (14-18 Units total) into the skin 3 (three) times daily before meals. [place]. Please provide insulin syringes needed Patient taking differently: Inject 10-16 Units into the skin See admin instructions. 16 units in the morning and 10 units  at night 08/11/19  Yes Philemon Kingdom, MD  lisinopril (ZESTRIL) 20 MG tablet Take 1 tablet (20 mg total) by mouth daily. 02/17/20  Yes Marin Olp, MD  lovastatin (MEVACOR) 40 MG tablet TAKE 1 TABLET BY MOUTH EVERYDAY AT BEDTIME Patient taking differently: Take 40 mg by mouth at bedtime.  09/22/19  Yes Marin Olp, MD  mirtazapine (REMERON) 7.5 MG tablet Take 1 tablet (7.5 mg total) by mouth at bedtime. 01/08/20  Yes [name], MD  montelukast (SINGULAIR) 10 MG tablet Take 1 tablet (10 mg total) by mouth at bedtime. 01/30/20  Yes Martyn Ehrich, NP  ondansetron (ZOFRAN) 8 MG tablet Take 1 tablet (8 mg total) by mouth 2 (two) times daily as needed (Nausea or vomiting). 01/08/20  Yes [name], MD  polyethylene glycol powder (GLYCOLAX/MIRALAX) 17 GM/SCOOP powder Take 17 g by mouth daily. Patient taking differently: Take 17 g by mouth as needed for mild constipation or moderate constipation.  05/22/19  Yes Pyrtle, Lajuan Lines, MD  traMADol (ULTRAM) 50 MG tablet TAKE 1 TABLET BY MOUTH EVERY 6 HOURS AS NEEDED FOR MODERATE/SEVERE  PAIN (NECK AND SHOULDER PAIN). Patient taking differently: Take 50 mg by mouth every 6 (six) hours as needed for moderate pain or severe pain (neck and shoulder pain).  01/14/20  Yes Marin Olp, MD  B-D INS SYRINGE 0.5CC/31GX5/16 31G X 5/16" 0.5 ML MISC USE AS DIRECTED 4 TIMES A DAY 08/18/16   Philemon Kingdom, MD  fluticasone furoate-vilanterol (BREO ELLIPTA) 200-25 MCG/INH AEPB Inhale 1 puff into the lungs daily. Patient not taking: Reported on 03/01/2020 02/25/20   Marshell Garfinkel, MD  Insulin Pen Needle 31G X 5 MM MISC Use pen needles for insulin injection daily 12/12/16   Philemon Kingdom, MD  Insulin Syringe-Needle U-100 (B-D INS SYRINGE 0.5CC/30GX1/2") 30G X 1/2" 0.5 ML MISC USE AS DIRECTED 4 TIMES A DAY 08/15/16   Philemon Kingdom, MD  lidocaine-prilocaine (EMLA) cream Apply to affected area once Patient not taking: Reported on 02/17/2020 01/08/20   [name], MD  predniSONE (DELTASONE) 10 MG tablet Take 2 tabs x 5 days Patient not taking: Reported on 03/01/2020 01/30/20   Martyn Ehrich, NP    Physical Exam: Vitals:   03/02/20 0510 03/02/20 0548 03/02/20 0602 03/02/20 0620  BP:  (!) 161/94 (!) 179/67   Pulse:  95 (!) 101   Resp:  (!) 24 20   Temp:  98.1 F (36.7 C) 98.4 F (36.9 C)   TempSrc:      SpO2: 100% 96% 100%   Weight:    81.2 kg  Height:    5\' 2"  (1.575 m)    Physical Exam  Constitutional: She is oriented to person, place, and time. She appears well-developed and well-nourished. No distress.  HENT:  Head: Normocephalic.  Eyes: Right eye exhibits no discharge. Left eye exhibits no discharge.  Cardiovascular: Normal rate, regular rhythm and intact distal pulses.  Pulmonary/Chest: She is in respiratory distress. She has wheezes.  Slightly tachypneic  Abdominal: Soft. Bowel sounds are normal. She exhibits distension. There is no abdominal tenderness. There is no rebound and no guarding.  Musculoskeletal:     Cervical back: Neck supple.     Comments: Trace  pedal edema  Neurological: She is alert and oriented to person, place, and time.  Skin: Skin is warm and dry. She is not diaphoretic.    Labs on Admission: I have personally reviewed following labs and imaging studies  CBC: Recent Labs  Lab 03/01/20 1731  WBC 7.3  HGB 12.6  HCT 37.9  MCV 90.0  PLT 542   Basic Metabolic Panel: Recent Labs  Lab 03/01/20 1731  NA 142  K 4.3  CL 110  CO2 21*  GLUCOSE 164*  BUN 15  CREATININE 1.56*  CALCIUM 9.4   GFR: Estimated Creatinine Clearance: 29.3 mL/min (A) (by C-G formula based on SCr of 1.56 mg/dL (H)). Liver Function Tests: No results for input(s): AST, ALT, ALKPHOS, BILITOT, PROT, ALBUMIN in the last 168 hours. No results for input(s): LIPASE, AMYLASE in the last 168 hours. No results for input(s): AMMONIA in the last 168 hours. Coagulation Profile: No results for input(s): INR, PROTIME in the last 168 hours. Cardiac Enzymes: No results for input(s): CKTOTAL, CKMB, CKMBINDEX, TROPONINI in the last 168 hours. BNP (last 3 results) Recent Labs    09/15/19 1552  PROBNP CANCELED   HbA1C: No results for input(s): HGBA1C in the last 72 hours. CBG: Recent Labs  Lab 03/01/20 2132 03/02/20 0313 03/02/20 0637  GLUCAP 79 126* 204*   Lipid Profile: No results for input(s): CHOL, HDL, LDLCALC, TRIG, CHOLHDL, LDLDIRECT in the last 72 hours. Thyroid Function Tests: No results for input(s): TSH, T4TOTAL, FREET4, T3FREE, THYROIDAB in the last 72 hours. Anemia Panel: No results for input(s): VITAMINB12, FOLATE, FERRITIN, TIBC, IRON, RETICCTPCT in the last 72 hours. Urine analysis:    Component Value Date/Time   COLORURINE YELLOW 01/09/2018 1230   APPEARANCEUR CLEAR 01/09/2018 1230   LABSPEC 1.014 01/09/2018 1230   PHURINE 5.0 01/09/2018 1230   GLUCOSEU NEGATIVE 01/09/2018 1230   HGBUR NEGATIVE 01/09/2018 1230   BILIRUBINUR Negative 10/07/2019 [place] 01/09/2018 1230   PROTEINUR Positive (A) 10/07/2019  1121   PROTEINUR 100 (A) 01/09/2018 1230   UROBILINOGEN 0.2 10/07/2019 1121   NITRITE Negative 10/07/2019 1121   NITRITE NEGATIVE 01/09/2018 1230   LEUKOCYTESUR Negative 10/07/2019 1121    Radiological Exams on Admission: DG Chest 2 View  Result Date: 03/01/2020 CLINICAL DATA:  Patient reports sob and cough with chest congestion for 3 months. Reports thoracentesis 2 days ago. EXAM: CHEST - 2 VIEW COMPARISON:  Chest radiograph 01/26/2020, 02/24/2020 FINDINGS: Stable cardiomediastinal contours. There is a small right pleural effusion with adjacent opacities possibly representing atelectasis. There is a small nonspecific opacity in the right mid lung which appears new from prior. The left lung is clear. No pneumothorax. No acute finding in the visualized skeleton. IMPRESSION: 1. Small right pleural effusion with adjacent opacities possibly representing atelectasis. 2. New small focal opacity in the right mid lung is nonspecific, possibly atelectasis though early infection not excluded. Electronically Signed   By: Audie Pinto M.D.   On: 03/01/2020 17:50   CT Angio Chest PE W and/or Wo Contrast  Result Date: 03/02/2020 CLINICAL DATA:  Shortness of breath, history of recent thoracentesis EXAM: CT ANGIOGRAPHY CHEST WITH CONTRAST TECHNIQUE: Multidetector CT imaging of the chest was performed using the standard protocol during bolus administration of intravenous contrast. Multiplanar CT image reconstructions and MIPs were obtained to evaluate the vascular anatomy. CONTRAST:  38mL OMNIPAQUE IOHEXOL 350 MG/ML SOLN COMPARISON:  Chest x-ray from the previous day, CT from 10/31/2019, PET-CT from 01/07/2020 FINDINGS: Cardiovascular: Thoracic aorta and its branches are well visualize without aneurysmal dilatation or dissection. No cardiac enlargement is seen. Mild pericardial effusion is noted. The pulmonary artery shows a normal branching pattern. No definitive filling defect to suggest pulmonary embolism is  seen. Mediastinum/Nodes: Thoracic inlet shows lymphadenopathy  in the left supraclavicular region as well as posterior to the clavicle on the left. Scattered mediastinal lymph nodes are noted in the pre-vascular region as well as AP window slightly increased when compared with the prior exam. Additionally there are hilar lymph nodes identified as well as subcarinal adenopathy increased from the prior study. Subcarinal lymph node measures at least 2.1 cm in short axis and previously measured approximately 1 cm in short axis on prior PET-CT. Right hilar lymph node measures 2.1 cm in short axis also increased from the prior study. Significant bulky adenopathy is noted in the left axilla as well as a focal soft tissue mass lesion which measures 5.1 by 2.5 cm best seen on image number 136 of series 7. This is consistent with recurrent disease and is similar to that seen on recent PET-CT. Bulky lymph nodes are noted associated with this mass which appear increased when compared with the prior exam consistent with progression of disease. Multiple subpectoral lymph nodes are noted on the left also consistent with progressive disease. Lungs/Pleura: Small left pleural effusion is noted consistent with the recent thoracentesis. Moderate right-sided pleural effusion is noted. The lungs demonstrate multiple parenchymal nodules which have increased in size and number when compared with the prior exam. Largest of these on the left is posterior along the pleural margin measuring 1.9 cm. The largest of these on the right is seen in the upper lobe measuring approximately 11 mm. Right lower lobe consolidation is noted of a mild degree. Upper Abdomen: Visualized upper abdomen demonstrates multiple renal cysts. Additionally there is a rounded hypodensity in the posterior aspect of the right lobe of the liver best seen on image number 354 of series 7 consistent with metastatic disease. This is stable from the prior PET-CT.  Musculoskeletal: Degenerative changes of the thoracic spine are noted. Medial scapular metastatic lesion is noted on the left similar to that seen on prior PET-CT. Pathologic fracture is again noted involving the right eighth rib posteriorly. Some mottled areas of bony density are noted within the ribcage consistent with metastatic disease. Review of the MIP images confirms the above findings. IMPRESSION: No evidence of pulmonary emboli. Progression of disease particularly within the lungs, hila and mediastinum consistent with the known history of breast carcinoma with recurrence. Persistent left breast mass is noted with increasing left axillary and left chest wall lymphadenopathy. Bilateral pleural effusions right greater than left. Bony metastatic disease. Electronically Signed   By: Inez Catalina M.D.   On: 03/02/2020 01:41    EKG: Independently reviewed.  Sinus rhythm, no acute ischemic changes.  Assessment/Plan Active Problems:   Hyperlipidemia associated with type 2 diabetes mellitus ([place])   Hypertension associated with diabetes ([place])   Asthma exacerbation   Malignant pleural effusion   Metastatic breast cancer ([place])   Acute asthma exacerbation: Tachypneic and placed on 2 L supplemental oxygen for comfort.  Not hypoxic.  Patient was given a continuous albuterol neb treatment.  Still wheezing on exam and slightly tachypneic. -Continue DuoNebs every 6 hours, albuterol nebulizer as needed, Pulmicort nebulizer twice daily, and Solu-Medrol 60 mg every 6 hours.  Continue home Singulair.  Continuous pulse ox, supplemental oxygen as needed.  Pleural effusions, likely malignant: Patient underwent thoracentesis on 5/25 for left-sided pleural effusion and cytology revealed malignant cells consistent with metastatic adenocarcinoma.  CT done today showing bilateral pleural effusions, moderate on the right and small on the left.  Pleural effusion on the right seems to be worsening as it was small  on chest  x-ray done 5/25. -Consult IR in a.m. for thoracentesis of right pleural effusion versus possible Pleurx catheter placement.  Recent echo with evidence of diastolic dysfunction, check BNP level.  Metastatic breast cancer: Followed by oncology and currently on chemo.  CT of lungs with findings concerning for progression of disease.  -Patient noted to have significant abdominal distention on exam, will also order CT abdomen pelvis.  Please ensure oncology follow-up.  Hypertension -Continue home amlodipine, Coreg, Lasix  Hyperlipidemia -Continue home pravastatin  Insulin-dependent diabetes -Check A1c.  Continue home basal insulin.  Order sliding scale insulin with meals.  Chronic pain -Continue home Norco as needed  DVT prophylaxis: SCDs at this time, pending IR evaluation Code Status: Patient wishes to be full code. Family Communication: No family available at this time. Disposition Plan: Status is: Observation  The patient remains OBS appropriate and will d/c before 2 midnights.  Dispo: The patient is from: Home              Anticipated d/c is to: Home              Anticipated d/c date is: 2 days              Patient currently is not medically stable to d/c.  The medical decision making on this patient was of high complexity and the patient is at high risk for clinical deterioration, therefore this is a level 3 visit.  Shela Leff MD [facility]  If 7PM-7AM, please contact night-coverage [email or web address]  03/02/2020, 7:07 AM

## 2020-03-03 ENCOUNTER — Telehealth: Payer: Self-pay

## 2020-03-03 DIAGNOSIS — C50919 Malignant neoplasm of unspecified site of unspecified female breast: Secondary | ICD-10-CM | POA: Diagnosis not present

## 2020-03-03 DIAGNOSIS — E1169 Type 2 diabetes mellitus with other specified complication: Secondary | ICD-10-CM | POA: Diagnosis present

## 2020-03-03 DIAGNOSIS — R0600 Dyspnea, unspecified: Secondary | ICD-10-CM

## 2020-03-03 DIAGNOSIS — E1122 Type 2 diabetes mellitus with diabetic chronic kidney disease: Secondary | ICD-10-CM | POA: Diagnosis present

## 2020-03-03 DIAGNOSIS — Z66 Do not resuscitate: Secondary | ICD-10-CM | POA: Diagnosis present

## 2020-03-03 DIAGNOSIS — E669 Obesity, unspecified: Secondary | ICD-10-CM | POA: Diagnosis present

## 2020-03-03 DIAGNOSIS — E785 Hyperlipidemia, unspecified: Secondary | ICD-10-CM | POA: Diagnosis present

## 2020-03-03 DIAGNOSIS — F329 Major depressive disorder, single episode, unspecified: Secondary | ICD-10-CM | POA: Diagnosis present

## 2020-03-03 DIAGNOSIS — Z20822 Contact with and (suspected) exposure to covid-19: Secondary | ICD-10-CM | POA: Diagnosis present

## 2020-03-03 DIAGNOSIS — C50412 Malignant neoplasm of upper-outer quadrant of left female breast: Secondary | ICD-10-CM | POA: Diagnosis present

## 2020-03-03 DIAGNOSIS — R0602 Shortness of breath: Secondary | ICD-10-CM | POA: Diagnosis not present

## 2020-03-03 DIAGNOSIS — J910 Malignant pleural effusion: Secondary | ICD-10-CM | POA: Diagnosis present

## 2020-03-03 DIAGNOSIS — I130 Hypertensive heart and chronic kidney disease with heart failure and stage 1 through stage 4 chronic kidney disease, or unspecified chronic kidney disease: Secondary | ICD-10-CM | POA: Diagnosis present

## 2020-03-03 DIAGNOSIS — C7951 Secondary malignant neoplasm of bone: Secondary | ICD-10-CM | POA: Diagnosis present

## 2020-03-03 DIAGNOSIS — K219 Gastro-esophageal reflux disease without esophagitis: Secondary | ICD-10-CM | POA: Diagnosis present

## 2020-03-03 DIAGNOSIS — I152 Hypertension secondary to endocrine disorders: Secondary | ICD-10-CM | POA: Diagnosis present

## 2020-03-03 DIAGNOSIS — F419 Anxiety disorder, unspecified: Secondary | ICD-10-CM | POA: Diagnosis present

## 2020-03-03 DIAGNOSIS — N184 Chronic kidney disease, stage 4 (severe): Secondary | ICD-10-CM | POA: Diagnosis present

## 2020-03-03 DIAGNOSIS — I5032 Chronic diastolic (congestive) heart failure: Secondary | ICD-10-CM | POA: Diagnosis present

## 2020-03-03 DIAGNOSIS — E1165 Type 2 diabetes mellitus with hyperglycemia: Secondary | ICD-10-CM | POA: Diagnosis present

## 2020-03-03 DIAGNOSIS — Z515 Encounter for palliative care: Secondary | ICD-10-CM | POA: Diagnosis not present

## 2020-03-03 DIAGNOSIS — C7800 Secondary malignant neoplasm of unspecified lung: Secondary | ICD-10-CM | POA: Diagnosis present

## 2020-03-03 DIAGNOSIS — I890 Lymphedema, not elsewhere classified: Secondary | ICD-10-CM | POA: Diagnosis present

## 2020-03-03 DIAGNOSIS — C787 Secondary malignant neoplasm of liver and intrahepatic bile duct: Secondary | ICD-10-CM | POA: Diagnosis present

## 2020-03-03 DIAGNOSIS — Z7189 Other specified counseling: Secondary | ICD-10-CM | POA: Diagnosis not present

## 2020-03-03 DIAGNOSIS — N179 Acute kidney failure, unspecified: Secondary | ICD-10-CM | POA: Diagnosis present

## 2020-03-03 DIAGNOSIS — J45901 Unspecified asthma with (acute) exacerbation: Secondary | ICD-10-CM | POA: Diagnosis present

## 2020-03-03 DIAGNOSIS — J9601 Acute respiratory failure with hypoxia: Secondary | ICD-10-CM | POA: Diagnosis present

## 2020-03-03 LAB — BASIC METABOLIC PANEL
Anion gap: 8 (ref 5–15)
Anion gap: 9 (ref 5–15)
BUN: 31 mg/dL — ABNORMAL HIGH (ref 8–23)
BUN: 41 mg/dL — ABNORMAL HIGH (ref 8–23)
CO2: 23 mmol/L (ref 22–32)
CO2: 21 mmol/L — ABNORMAL LOW (ref 22–32)
Calcium: 8.8 mg/dL — ABNORMAL LOW (ref 8.9–10.3)
Calcium: 8.6 mg/dL — ABNORMAL LOW (ref 8.9–10.3)
Chloride: 105 mmol/L (ref 98–111)
Chloride: 106 mmol/L (ref 98–111)
Creatinine, Ser: 1.86 mg/dL — ABNORMAL HIGH (ref 0.44–1.00)
Creatinine, Ser: 2.06 mg/dL — ABNORMAL HIGH (ref 0.44–1.00)
GFR calc Af Amer: 30 mL/min — ABNORMAL LOW (ref >60)
GFR calc Af Amer: 26 mL/min — ABNORMAL LOW (ref >60)
GFR calc non Af Amer: 25 mL/min — ABNORMAL LOW (ref >60)
GFR calc non Af Amer: 23 mL/min — ABNORMAL LOW (ref >60)
Glucose, Bld: 321 mg/dL — ABNORMAL HIGH (ref 70–99)
Glucose, Bld: 213 mg/dL — ABNORMAL HIGH (ref 70–99)
Potassium: 5.8 mmol/L — ABNORMAL HIGH (ref 3.5–5.1)
Potassium: 5.2 mmol/L — ABNORMAL HIGH (ref 3.5–5.1)
Sodium: 136 mmol/L (ref 135–145)
Sodium: 136 mmol/L (ref 135–145)

## 2020-03-03 LAB — GLUCOSE, CAPILLARY
Glucose-Capillary: 152 mg/dL — ABNORMAL HIGH (ref 70–99)
Glucose-Capillary: 175 mg/dL — ABNORMAL HIGH (ref 70–99)
Glucose-Capillary: 342 mg/dL — ABNORMAL HIGH (ref 70–99)
Glucose-Capillary: 380 mg/dL — ABNORMAL HIGH (ref 70–99)

## 2020-03-03 LAB — MAGNESIUM: Magnesium: 2.3 mg/dL (ref 1.7–2.4)

## 2020-03-03 MED ORDER — SODIUM CHLORIDE 0.9 % IV SOLN: INTRAVENOUS | Status: DC

## 2020-03-03 MED ORDER — INSULIN GLARGINE 100 UNIT/ML ~~LOC~~ SOLN
35.0000 [IU] | Freq: Every day | SUBCUTANEOUS | Status: DC
  Administered 2020-03-04 – 2020-03-05 (×2): 35 [IU] via SUBCUTANEOUS
  Filled 2020-03-03 (×2): qty 0.35

## 2020-03-03 MED ORDER — MORPHINE SULFATE 10 MG/5ML PO SOLN: 2.5000 mg | ORAL | Status: DC | PRN

## 2020-03-03 MED ORDER — INSULIN GLARGINE 100 UNIT/ML ~~LOC~~ SOLN
7.0000 [IU] | Freq: Once | SUBCUTANEOUS | Status: AC
  Administered 2020-03-03: 7 [IU] via SUBCUTANEOUS
  Filled 2020-03-03: qty 0.07

## 2020-03-03 MED ORDER — METHYLPREDNISOLONE SODIUM SUCC 125 MG IJ SOLR: 40.0000 mg | INTRAMUSCULAR | Status: DC

## 2020-03-03 MED ORDER — SODIUM CHLORIDE 0.9 % IV BOLUS
500.0000 mL | Freq: Once | INTRAVENOUS | Status: AC
  Administered 2020-03-03: 500 mL via INTRAVENOUS

## 2020-03-03 MED ORDER — INSULIN ASPART 100 UNIT/ML ~~LOC~~ SOLN
6.0000 [IU] | Freq: Three times a day (TID) | SUBCUTANEOUS | Status: DC
  Administered 2020-03-03 – 2020-03-05 (×7): 6 [IU] via SUBCUTANEOUS

## 2020-03-03 MED ORDER — METHYLPREDNISOLONE SODIUM SUCC 125 MG IJ SOLR
40.0000 mg | Freq: Two times a day (BID) | INTRAMUSCULAR | Status: DC
  Administered 2020-03-03 – 2020-03-04 (×2): 40 mg via INTRAVENOUS
  Filled 2020-03-03 (×2): qty 2

## 2020-03-03 NOTE — Progress Notes (Signed)
Inpatient Diabetes Program Recommendations  AACE/ADA: New Consensus Statement on Inpatient Glycemic Control (2015)  Target Ranges:  Prepandial:   less than 140 mg/dL      Peak postprandial:   less than 180 mg/dL (1-2 hours)      Critically ill patients:  140 - 180 mg/dL   Results for Wendy [name], Wendy [name] (MRN [id number]) as of 03/03/2020 10:08  Ref. Range 03/02/2020 06:37 03/02/2020 11:50 03/02/2020 16:52 03/02/2020 21:22  Glucose-Capillary Latest Ref Range: 70 - 99 mg/dL 204 (H)  5 units NOVOLOG  198 (H)  3 units NOVOLOG +  28 units LANTUS 363 (H)  15 units NOVOLOG  392 (H)  6 units NOVOLOG    Results for Wendy [name], Wendy [name] (MRN [id number]) as of 03/03/2020 10:08  Ref. Range 03/03/2020 07:48  Glucose-Capillary Latest Ref Range: 70 - 99 mg/dL 342 (H)  11 units NOVOLOG +  28 units LANTUS   Results for Wendy [name], Wendy [name] (MRN [id number]) as of 03/03/2020 10:08  Ref. Range 03/02/2020 09:32  Hemoglobin A1C Latest Ref Range: 4.8 - 5.6 % 7.2 (H)    Admit with: Acute asthma exacerbation/ Pleural effusions, likely malignant  History: DM, Metastatic Breast Cancer on Chemo, CKD  Home DM Meds: Lantus 28 units Daily       Regular 16 units AM/ 10 units PM  Current Orders: Lantus 28 units Daily      Novolog Moderate Correction Scale/ SSI (0-15 units) TID AC        MD- Note patient getting Solumedrol 60 mg Q6 hours.  CBG 342 this AM.  Please consider the following:  1. Increase Lantus to 35 units Daily (30% increase)--Since dose already given this AM, please consider ordering Lantus 7 units X 1 dose to be given this AM as well  2. Please start Novolog Meal Coverage: Novolog 6 units TID with meals to start (Please add the following Hold Parameters: Hold if pt eats <50% of meal, Hold if pt NPO)      --Will follow patient during hospitalization--  Wyn Quaker RN, MSN, CDE Diabetes Coordinator Inpatient Glycemic Control Team Team Pager: [phone] (8a-5p)

## 2020-03-03 NOTE — Progress Notes (Signed)
Wendy [name]   DOB:[birth year]   XL#:[id number]   ZDG#:[id number]  Oncology follow up note   Subjective: Patient is well-known to me, under my care for her metastatic breast cancer.  She is currently on first-line chemotherapy Taxol.  She was admitted for worsening dyspnea, status post left centesis last week, and a right thoracentesis yesterday, and only had about 300 cc fluids removed.  Patient appears to be slightly confused when I saw her today, did not recognize me initially.  She denies significant pain, and states her dyspnea is stable, no significant improvement after procedures.   Objective:  Vitals:   03/03/20 1420 03/03/20 1548  BP: (!) 130/92   Pulse: (!) 108   Resp: 14   Temp: 97.7 F (36.5 C)   SpO2:  100%    Body mass index is 32.74 kg/m.  Intake/Output Summary (Last 24 hours) at 03/03/2020 1908 Last data filed at 03/03/2020 1700 Gross per 24 hour  Intake 660 ml  Output 800 ml  Net -140 ml     Sclerae unicteric  (+) Severe left upper extremity lymphedema  Lungs clear -- no rales or rhonchi, decreased breath sounds bilateral lung bases  Heart regular rate and rhythm  Abdomen benign  MSK no focal spinal tenderness, (+) bilateral leg edema    CBG (last 3)  Recent Labs    03/03/20 0748 03/03/20 1147 03/03/20 1647  GLUCAP 342* 380* 152*     Labs:  Urine Studies No results for input(s): UHGB, CRYS in the last 72 hours.  Invalid input(s): UACOL, UAPR, USPG, UPH, UTP, UGL, UKET, UBIL, UNIT, UROB, ULEU, UEPI, UWBC, [place], [place], [place], [place], [place]  Basic Metabolic Panel: Recent Labs  Lab 03/01/20 1731 03/01/20 1731 03/03/20 0435 03/03/20 1503  NA 142  --  136 136  K 4.3   < > 5.8* 5.2*  CL 110  --  105 106  CO2 21*  --  23 21*  GLUCOSE 164*  --  321* 213*  BUN 15  --  31* 41*  CREATININE 1.56*  --  1.86* 2.06*  CALCIUM 9.4  --  8.8* 8.6*  MG  --   --  2.3  --    < > = values in this interval not displayed.   GFR Estimated Creatinine Clearance: 22.2  mL/min (A) (by C-G formula based on SCr of 2.06 mg/dL (H)). Liver Function Tests: Recent Labs  Lab 03/02/20 0932  PROT 6.7   No results for input(s): LIPASE, AMYLASE in the last 168 hours. No results for input(s): AMMONIA in the last 168 hours. Coagulation profile No results for input(s): INR, PROTIME in the last 168 hours.  CBC: Recent Labs  Lab 03/01/20 1731  WBC 7.3  HGB 12.6  HCT 37.9  MCV 90.0  PLT 371   Cardiac Enzymes: No results for input(s): CKTOTAL, CKMB, CKMBINDEX, TROPONINI in the last 168 hours. BNP: Invalid input(s): POCBNP CBG: Recent Labs  Lab 03/02/20 1652 03/02/20 2122 03/03/20 0748 03/03/20 1147 03/03/20 1647  GLUCAP 363* 392* 342* 380* 152*   D-Dimer No results for input(s): DDIMER in the last 72 hours. Hgb A1c Recent Labs    03/02/20 0932  HGBA1C 7.2*   Lipid Profile No results for input(s): CHOL, HDL, LDLCALC, TRIG, CHOLHDL, LDLDIRECT in the last 72 hours. Thyroid function studies No results for input(s): TSH, T4TOTAL, T3FREE, THYROIDAB in the last 72 hours.  Invalid input(s): FREET3 Anemia work up No results for input(s): VITAMINB12, FOLATE, FERRITIN, TIBC, IRON, RETICCTPCT  in the last 72 hours. Microbiology Recent Results (from the past 240 hour(s))  SARS Coronavirus 2 (TAT 6-24 hrs)     Status: None   Collection Time: 02/23/20  9:19 AM   Specimen: Nasopharyngeal Swab  Result Value Ref Range Status   SARS Coronavirus 2 NEGATIVE NEGATIVE Final    Comment: (NOTE) SARS-CoV-2 target nucleic acids are NOT DETECTED. The SARS-CoV-2 RNA is generally detectable in upper and lower respiratory specimens during the acute phase of infection. Negative results do not preclude SARS-CoV-2 infection, do not rule out co-infections with other pathogens, and should not be used as the sole basis for treatment or other patient management decisions. Negative results must be combined with clinical observations, patient history, and epidemiological  information. The expected result is Negative. Fact Sheet for Patients: SugarRoll.be Fact Sheet for Healthcare Providers: https://www.woods-mathews.com/ This test is not yet approved or cleared by the [place] FDA and  has been authorized for detection and/or diagnosis of SARS-CoV-2 by FDA under an Emergency Use Authorization (EUA). This EUA will remain  in effect (meaning this test can be used) for the duration of the COVID-19 declaration under Section 56 4(b)(1) of the Act, 21 U.S.C. section 360bbb-3(b)(1), unless the authorization is terminated or revoked sooner. Performed at [facility], [place] [address] [place], [place] [address]   Culture, body fluid-bottle     Status: None   Collection Time: 02/24/20  4:07 PM   Specimen: Fluid  Result Value Ref Range Status   Specimen Description FLUID PLEURAL  Final   Special Requests BOTTLES DRAWN AEROBIC AND ANAEROBIC  Final   Culture   Final    NO GROWTH 5 DAYS Performed at [facility], [place] [address] [place], [place] [address]    Report Status 02/29/2020 FINAL  Final  Gram stain     Status: None   Collection Time: 02/24/20  4:07 PM   Specimen: Fluid  Result Value Ref Range Status   Specimen Description FLUID PLEURAL  Final   Special Requests NONE  Final   Gram Stain   Final    FEW WBC PRESENT, PREDOMINANTLY MONONUCLEAR NO ORGANISMS SEEN Performed at [facility], [place] [address] [place], [place] [address]    Report Status 02/25/2020 FINAL  Final  SARS Coronavirus 2 by RT PCR (hospital order, performed in [facility] hospital lab) Nasopharyngeal Nasopharyngeal Swab     Status: None   Collection Time: 03/02/20  3:34 AM   Specimen: Nasopharyngeal Swab  Result Value Ref Range Status   SARS Coronavirus 2 NEGATIVE NEGATIVE Final    Comment: (NOTE) SARS-CoV-2 target nucleic acids are NOT DETECTED. The SARS-CoV-2 RNA is generally detectable in upper and  lower respiratory specimens during the acute phase of infection. The lowest concentration of SARS-CoV-2 viral copies this assay can detect is 250 copies / mL. A negative result does not preclude SARS-CoV-2 infection and should not be used as the sole basis for treatment or other patient management decisions.  A negative result may occur with improper specimen collection / handling, submission of specimen other than nasopharyngeal swab, presence of viral mutation(s) within the areas targeted by this assay, and inadequate number of viral copies (<250 copies / mL). A negative result must be combined with clinical observations, patient history, and epidemiological information. Fact Sheet for Patients:   StrictlyIdeas.no Fact Sheet for Healthcare Providers: BankingDealers.co.za This test is not yet approved or cleared  by the [place] FDA and has been authorized  for detection and/or diagnosis of SARS-CoV-2 by FDA under an Emergency Use Authorization (EUA).  This EUA will remain in effect (meaning this test can be used) for the duration of the COVID-19 declaration under Section 564(b)(1) of the Act, 21 U.S.C. section 360bbb-3(b)(1), unless the authorization is terminated or revoked sooner. Performed at [facility], [place] [address], [place], [place] [address]   Body fluid culture     Status: None (Preliminary result)   Collection Time: 03/02/20  2:09 PM   Specimen: PATH Cytology Pleural fluid  Result Value Ref Range Status   Specimen Description FLUID PLEURAL  Final   Special Requests NONE  Final   Gram Stain   Final    RARE WBC PRESENT, PREDOMINANTLY MONONUCLEAR NO ORGANISMS SEEN    Culture   Final    NO GROWTH < 24 HOURS Performed at [facility], [place] [address] [place], [place] [address]    Report Status PENDING  Incomplete      Studies:  CT ABDOMEN PELVIS WO CONTRAST  Result Date: 03/02/2020 CLINICAL DATA:   Abdominal distension. History of malignancy. Evaluate for possible bowel obstruction. EXAM: CT ABDOMEN AND PELVIS WITHOUT CONTRAST TECHNIQUE: Multidetector CT imaging of the abdomen and pelvis was performed following the standard protocol without IV contrast. COMPARISON:  Chest CT, same date and prior abdominal CT scan 01/17/2018 FINDINGS: Lower chest: There are bilateral pleural effusions and numerous small metastatic pulmonary nodules. The heart is normal in size. Small amount of pericardial fluid without overt effusion. Hepatobiliary: 3 cm segment 6 liver lesion again demonstrated. No other obvious hepatic lesions without contrast. The gallbladder appears normal. No intra or extrahepatic biliary dilatation. Pancreas: Advanced pancreatic atrophy but no mass or inflammation. Spleen: Normal size.  No focal lesions. Adrenals/Urinary Tract: The adrenal glands are unremarkable. Some residual contrast in the collecting system of both kidneys from the recent chest CT. Prior numerous bilateral renal cysts but no worrisome renal lesions or hydronephrosis. The bladder contains contrast. No bladder mass or asymmetric bladder wall thickening. A mild to moderate cystocele is noted. Stomach/Bowel: The stomach, duodenum, small bowel and colon are unremarkable. No acute inflammatory changes, mass lesions or obstructive findings. The terminal ileum and appendix are normal. Contrast gets all the way to the colon and there are no dilated small bowel loops. Vascular/Lymphatic: Scattered atherosclerotic calcifications involving the aorta and iliac arteries but no aneurysm. No mesenteric or retroperitoneal mass or lymphadenopathy. Reproductive: Enlarged fibroid uterus with calcified fibroids. The ovaries are normal. Other: No abdominal wall hernia or subcutaneous lesions are identified. No free abdominal/pelvic fluid collections. Musculoskeletal: No significant bony findings. IMPRESSION: 1. No acute abdominal/pelvic findings or  lymphadenopathy. No findings for small bowel obstruction. 2. 3 cm segment 6 liver lesion consistent with metastatic disease. 3. Enlarged fibroid uterus with calcified fibroids. 4. Bilateral pleural effusions and numerous small metastatic pulmonary nodules. Aortic Atherosclerosis (ICD10-I70.0). Electronically Signed   By: Marijo Sanes M.D.   On: 03/02/2020 10:58   DG Chest 1 View  Result Date: 03/02/2020 CLINICAL DATA:  Status post thoracentesis. Additional history provided: Post right thoracentesis, patient still having some shortness of breath, no unusual pain. EXAM: CHEST  1 VIEW COMPARISON:  CT angiogram chest 10/03/2019, chest radiograph 03/01/2020. FINDINGS: Unchanged cardiomediastinal silhouette. Interval decrease in size of a right pleural effusion consistent with provided history of interval thoracentesis. Unchanged trace left pleural effusion. Multiple pulmonary nodules were better appreciated on chest CT performed earlier the same day. No evidence of pneumothorax.  Surgical clips within the left axilla. Known osseous metastatic disease better appreciated on same-day chest CT. IMPRESSION: Interval decrease in conspicuity of a right pleural effusion consistent with interval thoracentesis. No evidence of pneumothorax. Additional findings without interval change as described. Electronically Signed   By: Kellie Simmering DO   On: 03/02/2020 14:12   CT Angio Chest PE W and/or Wo Contrast  Result Date: 03/02/2020 CLINICAL DATA:  Shortness of breath, history of recent thoracentesis EXAM: CT ANGIOGRAPHY CHEST WITH CONTRAST TECHNIQUE: Multidetector CT imaging of the chest was performed using the standard protocol during bolus administration of intravenous contrast. Multiplanar CT image reconstructions and MIPs were obtained to evaluate the vascular anatomy. CONTRAST:  87m OMNIPAQUE IOHEXOL 350 MG/ML SOLN COMPARISON:  Chest x-ray from the previous day, CT from 10/31/2019, PET-CT from 01/07/2020 FINDINGS:  Cardiovascular: Thoracic aorta and its branches are well visualize without aneurysmal dilatation or dissection. No cardiac enlargement is seen. Mild pericardial effusion is noted. The pulmonary artery shows a normal branching pattern. No definitive filling defect to suggest pulmonary embolism is seen. Mediastinum/Nodes: Thoracic inlet shows lymphadenopathy in the left supraclavicular region as well as posterior to the clavicle on the left. Scattered mediastinal lymph nodes are noted in the pre-vascular region as well as AP window slightly increased when compared with the prior exam. Additionally there are hilar lymph nodes identified as well as subcarinal adenopathy increased from the prior study. Subcarinal lymph node measures at least 2.1 cm in short axis and previously measured approximately 1 cm in short axis on prior PET-CT. Right hilar lymph node measures 2.1 cm in short axis also increased from the prior study. Significant bulky adenopathy is noted in the left axilla as well as a focal soft tissue mass lesion which measures 5.1 by 2.5 cm best seen on image number 136 of series 7. This is consistent with recurrent disease and is similar to that seen on recent PET-CT. Bulky lymph nodes are noted associated with this mass which appear increased when compared with the prior exam consistent with progression of disease. Multiple subpectoral lymph nodes are noted on the left also consistent with progressive disease. Lungs/Pleura: Small left pleural effusion is noted consistent with the recent thoracentesis. Moderate right-sided pleural effusion is noted. The lungs demonstrate multiple parenchymal nodules which have increased in size and number when compared with the prior exam. Largest of these on the left is posterior along the pleural margin measuring 1.9 cm. The largest of these on the right is seen in the upper lobe measuring approximately 11 mm. Right lower lobe consolidation is noted of a mild degree. Upper  Abdomen: Visualized upper abdomen demonstrates multiple renal cysts. Additionally there is a rounded hypodensity in the posterior aspect of the right lobe of the liver best seen on image number 354 of series 7 consistent with metastatic disease. This is stable from the prior PET-CT. Musculoskeletal: Degenerative changes of the thoracic spine are noted. Medial scapular metastatic lesion is noted on the left similar to that seen on prior PET-CT. Pathologic fracture is again noted involving the right eighth rib posteriorly. Some mottled areas of bony density are noted within the ribcage consistent with metastatic disease. Review of the MIP images confirms the above findings. IMPRESSION: No evidence of pulmonary emboli. Progression of disease particularly within the lungs, hila and mediastinum consistent with the known history of breast carcinoma with recurrence. Persistent left breast mass is noted with increasing left axillary and left chest wall lymphadenopathy. Bilateral pleural effusions right greater than  left. Bony metastatic disease. Electronically Signed   By: Inez Catalina M.D.   On: 03/02/2020 01:41   VAS [name] LOWER EXTREMITY VENOUS (DVT)  Result Date: 03/02/2020  Lower Venous DVTStudy Indications: Pain, and Edema.  Limitations: Pain, poor ultrasound/tissue interface and body habitus. Performing Technologist: Antonieta Pert RDMS, RVT  Examination Guidelines: A complete evaluation includes B-mode imaging, spectral Doppler, color Doppler, and power Doppler as needed of all accessible portions of each vessel. Bilateral testing is considered an integral part of a complete examination. Limited examinations for reoccurring indications may be performed as noted. The reflux portion of the exam is performed with the patient in reverse Trendelenburg.  +---------+---------------+---------+-----------+----------+-------------------+  RIGHT     Compressibility Phasicity Spontaneity Properties Thrombus Aging        +---------+---------------+---------+-----------+----------+-------------------+  CFV       Full            Yes       Yes                                         +---------+---------------+---------+-----------+----------+-------------------+  SFJ       Full                                                                  +---------+---------------+---------+-----------+----------+-------------------+  FV Prox   Full                                                                  +---------+---------------+---------+-----------+----------+-------------------+  FV Mid    Full                                                                  +---------+---------------+---------+-----------+----------+-------------------+  FV Distal                 Yes       Yes                    could not tolerate                                                               compression          +---------+---------------+---------+-----------+----------+-------------------+  PFV       Full                                                                  +---------+---------------+---------+-----------+----------+-------------------+  POP                                                        Not visualized       +---------+---------------+---------+-----------+----------+-------------------+  PTV                       Yes                              could not tolerate                                                               compression          +---------+---------------+---------+-----------+----------+-------------------+  PERO                      Yes                              could not tolerate                                                               compression          +---------+---------------+---------+-----------+----------+-------------------+  GSV       Full                                                                  +---------+---------------+---------+-----------+----------+-------------------+    +---------+---------------+---------+-----------+----------+-------------------+  LEFT      Compressibility Phasicity Spontaneity Properties Thrombus Aging       +---------+---------------+---------+-----------+----------+-------------------+  CFV       Full            Yes       Yes                                         +---------+---------------+---------+-----------+----------+-------------------+  SFJ       Full                                                                  +---------+---------------+---------+-----------+----------+-------------------+  FV Prox                   Yes  could not tolerate                                                               compression          +---------+---------------+---------+-----------+----------+-------------------+  FV Mid                    Yes                              could not tolerate                                                               compression          +---------+---------------+---------+-----------+----------+-------------------+  FV Distal                 Yes       Yes                    could not tolerate                                                               compression          +---------+---------------+---------+-----------+----------+-------------------+  POP                       Yes       Yes                    could not tolerate                                                               compression          +---------+---------------+---------+-----------+----------+-------------------+  PTV                       Yes                              could not tolerate                                                               compression          +---------+---------------+---------+-----------+----------+-------------------+  PERO  Yes                              could not tolerate                                                               compression           +---------+---------------+---------+-----------+----------+-------------------+  GSV       Full                                                                  +---------+---------------+---------+-----------+----------+-------------------+     Summary: RIGHT: - There is no evidence of deep vein thrombosis in the lower extremity. However, portions of this examination were limited- see technologist comments above.  - No cystic structure found in the popliteal fossa.  LEFT: - There is no evidence of deep vein thrombosis in the lower extremity. However, portions of this examination were limited- see technologist comments above.  - No cystic structure found in the popliteal fossa. - Patient could not tolerate compression and almost did not tolerate any touch. best images possible.  *See table(s) above for measurements and observations. Electronically signed by Deitra Mayo MD on 03/02/2020 at 6:04:43 PM.    Final    IR THORACENTESIS ASP PLEURAL SPACE W/IMG GUIDE  Result Date: 03/02/2020 INDICATION: SHORTNESS OF BREATH, RIGHT PLEURAL EFFUSION EXAM: ULTRASOUND GUIDED RIGHT THORACENTESIS MEDICATIONS: 1% lidocaine local COMPLICATIONS: None immediate. PROCEDURE: An ultrasound guided thoracentesis was thoroughly discussed with the patient and questions answered. The benefits, risks, alternatives and complications were also discussed. The patient understands and wishes to proceed with the procedure. Written consent was obtained. Ultrasound was performed to localize and mark an adequate pocket of fluid in the right chest. The area was then prepped and draped in the normal sterile fashion. 1% Lidocaine was used for local anesthesia. Under ultrasound guidance a 6 Fr Safe-T-Centesis catheter was introduced. Thoracentesis was performed. The catheter was removed and a dressing applied. FINDINGS: A total of approximately 300 cc of blood tinged pleural fluid was removed. Samples were sent to the laboratory as requested by  the clinical team. IMPRESSION: Successful ultrasound guided right thoracentesis yielding 300 cc of pleural fluid. Electronically Signed   By: Jerilynn Mages.  Shick M.D.   On: 03/02/2020 15:02    Assessment: 79 y.o. AAF  1. Dyspnea and hypoxic respiratory failure secondary to lung metastasis and bilateral malignant pleural effusion 2.  Metastatic breast cancer to lungs, liver, and a bilateral malignant pleural effusion, on palliative chemotherapy Taxol 3.  Diabetes, on insulin 4.  Hypertension 5.  Left arm lymphedema 6.  Bilateral lower extremity edema, negative for DVT 7.  Deconditioning   Plan:  -I reviewed her recent CT chest, abdomen and pelvis which unfortunately showed disease progression.  Clinically she also developed worsening bilateral pleural effusion, consistent with cancer progression.   -She is status post bilateral thoracentesis within the past week, both procedure had only about 300 cc fluids removed, no sure if IR and pulmonary  feels she is a candidate for pleurx placement, will touch base with them  -I'll check the ER, PR and HER-2 on her recent cytology, to see if her metastatic breast cancer has changed the biology.  Her previously breast cancer was ER 80% positive with weak staining, PR negative and HER-2 negative.  She clinically behaves more likely triple negative breast cancer.  -Her previous [facility] revealed no actionable mutation  -I am very concerned about her overall declining in the past months and her ability to tolerate more chemo (plan to change regimen if we continue chemo).  We discussed the goal of her cancer care is palliative, to prolong her life and improve her quality of life.  Patient states she would like to prolong her life as much as we can offer, and not ready for hospice. She has 4 children, 3 of them live in [place].  She currently lives with her youngest son and his girlfriend, and does have adequate support at home.  We discussed living well, she does  not have advanced directives, and is not sure who will be her health care POA.  -after a lengthy discussion, pt agrees to have a family meeting with me to discuss future management plan, ie continue chemo vs change to palliative care and hospice. This will likely happy next week on her next office visit, if she will be discharged home in the next few days, I would like to give her more time to think about.it. -she is DNR  [name], MD 03/03/2020  7:08 PM

## 2020-03-03 NOTE — Progress Notes (Signed)
Phone [phone]   Subjective:  Wendy [name] is a 79 y.o. year old very pleasant female patient who presents for transitional care management and hospital follow up for shortness of breath related to malignant pleural effusion and asthma. Patient was hospitalized from 03/01/2020 to 03/05/2020. A TCM phone call was completed on 03/09/2020. Medical complexity   Patient with 64-month history of progressive shortness of breath/dyspnea on exertion.  Recently had also developed wheezing and dry cough.  She had tried Breo and albuterol without significant relief.  patient had seen pulmonology May 26 and concern was shortness of breath was related to pleural effusion from breast cancer recurrence as well as asthma-had thoracentesis on May 25 with 320 cc of fluid drained and cytology showing malignant cells consistent with metastatic adenocarcinoma.  Unfortunately even after thoracentesis symptoms progressed.In the emergency room patient was afebrile but tachypneic with a rate into the mid 20s.  She required 2 L supplemental oxygen initially.  Ultimately patient was discharged with home oxygen-required 4 L has dropped with ambulation of 70% without this.  Initial high-sensitivity troponin at 22 with repeat stable at 19 and EKG without acute ischemic changes.  COVID-19 testing with PCR was negative.  CT angiogram showed no pulmonary embolism but did show progression of cancer/malignancy in the lung/hila/mediastinum.  Left breast mass was persistent with increasing left axillary and left chest wall lymphadenopathy.  Bilateral pleural effusions noted with moderate on the right and small on the left.  Bony metastatic disease also noted.  For her asthma she was treated with albuterol and continuous nebulizers.  Also was started on Solu-Medrol.  Patient had another thoracentesis while hospitalized and discharge summary only noted 300 mL blood-tinged fluid removed through IR.   -She was continued on  montelukast  Shortness of breath and wheezing improved during hospitalization.  She did develop hyperglycemia as a result of the steroids.  Patient follows with endocrinology and had some slight insulin adjustments while hospitalized- 35 units if sugar over 200. 100 and 131 yesterday- have been really good.   Patient did have acute kidney injury on top of chronic kidney disease but IV fluids seem to be helpful with creatinine improving by discharge.  Losartan was held on discharge.  Also had some hyperkalemia which likely benefited by being off lisinopril.   Patient was seen by palliative care while hospitalized the-Dr. Burr Medico saw patient yesterday and told patient about progression of malignancy and option of transitioning to hospice versus trying other chemotherapies.  For her blood pressure-she was continued on amlodipine 10 mg, carvedilol 25 mg twice daily, clonidine 0.3 mg twice daily.  Lasix and lisinopril were held due to AKI -Luckily kidney function drastically improved when checked yesterday-we opted to remain off of the Lasix and lisinopril-she can restart Lasix if starts to have a lot of swelling in her legs from diastolic dysfunction  Hyperlipidemia-patient was continued on lovastatin  Bilateral lower extremity edema-patient also negative for DVT  She is continued buspirone 3 times daily for anxiety. Using mirtazepine at bedtime  She is set up with palliative visits and home health visits- trying to get set up with CNA help when son out of the home.   Considering pleurex catheter for pleural effusion  I personally reviewed chest x-ray from March 02, 2020-right pleural effusion decreased in size from prior x-ray.  Trace left pleural effusion noted.  Multiple pulmonary nodules noted.  No pneumothorax.  Bony abnormalities related to metastatic cancer noted.   See problem oriented charting as  well  Past Medical History-  Patient Active Problem List   Diagnosis Date Noted  . Malignant  pleural effusion 03/02/2020    Priority: High  . Metastatic breast cancer ([place]) 03/02/2020    Priority: High  . Shortness of breath 03/14/2017    Priority: High  . Malignant neoplasm of upper-outer quadrant of left breast in female, estrogen receptor positive ([place]) 11/20/2016    Priority: High  . 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 Diastolic dysfunction 93/81/8299    Priority: High  . Moderate persistent asthma 01/31/2020    Priority: Medium  . GERD (gastroesophageal reflux disease) 01/31/2020    Priority: Medium  . Lymphedema of left arm 09/03/2019    Priority: Medium  . 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[place]) 12/05/2018    Priority: Medium  . Rectal bleeding 01/17/2017    Priority: Medium  . CKD (chronic kidney disease) stage 3, GFR 30-59 ml/min (HCC) 03/17/2016    Priority: Medium  . TOBACCO USE 04/13/2008    Priority: Medium  . Hyperlipidemia associated with type 2 diabetes mellitus ([place]) 11/29/2006    Priority: Medium  . OBESITY, BMI 30-35 11/29/2006    Priority: Medium  . Hypertension associated with diabetes ([place]) 11/29/2006    Priority: Medium  . Advanced care planning/counseling discussion     Priority: Low  . Goals of care, counseling/discussion 01/08/2020    Priority: Low  . Fatigue 03/14/2017    Priority: Low  . Edema 03/14/2017    Priority: Low  . Port catheter in place 01/18/2017    Priority: Low  . Constipation 01/17/2017    Priority: Low  . Seasonal allergies 12/01/2013    Priority: Low  . Palliative care by specialist   . Allergic rhinitis 01/31/2020  . Grief 05/10/2018  . Anemia 01/09/2018    Medications- reviewed and updated  A medical reconciliation was performed comparing current medicines to hospital discharge medications. Current Outpatient Medications  Medication Sig Dispense Refill  . albuterol  (VENTOLIN HFA) 108 (90 Base) MCG/ACT inhaler Inhale 2 puffs into the lungs every 6 (six) hours as needed for wheezing or shortness of breath. 18 g 2  . amLODipine (NORVASC) 10 MG tablet TAKE 1 TABLET BY MOUTH EVERY DAY (Patient taking differently: Take 10 mg by mouth daily. ) 90 tablet 1  . B-D INS SYRINGE 0.5CC/31GX5/16 31G X 5/16" 0.5 ML MISC USE AS DIRECTED 4 TIMES A DAY 300 each 1  . busPIRone (BUSPAR) 10 MG tablet Take 1 tablet (10 mg total) by mouth 3 (three) times daily. 270 tablet 1  . carvedilol (COREG) 25 MG tablet Take 1 tablet (25 mg total) by mouth 2 (two) times daily with a meal. 180 tablet 3  . cloNIDine (CATAPRES) 0.3 MG tablet TAKE 1 TABLET (0.3 MG TOTAL) BY MOUTH 2 (TWO) TIMES DAILY. 180 tablet 1  . famotidine (PEPCID) 20 MG tablet TAKE 1 TABLET BY MOUTH EVERY DAY (Patient taking differently: Take 20 mg by mouth daily. ) 30 tablet 1  . fluticasone (FLONASE) 50 MCG/ACT nasal spray Place 1 spray into both nostrils daily. 16 g 2  . fluticasone furoate-vilanterol (BREO ELLIPTA) 100-25 MCG/INH AEPB Inhale 1 puff into the lungs daily. 14 each 0  . HYDROcodone-acetaminophen (NORCO/VICODIN) 5-325 MG tablet Take 1 tablet by mouth every 8 (eight) hours as needed for moderate pain. 15 tablet 0  . Insulin Glargine (LANTUS  SOLOSTAR) 100 UNIT/ML Solostar Pen Inject 28 units into the skin in am (Patient taking differently: Inject 28 Units into the skin daily. If over 220 give 35U) 10 pen 3  . Insulin Pen Needle 31G X 5 MM MISC Use pen needles for insulin injection daily 100 each 3  . insulin regular (NOVOLIN R) 100 units/mL injection Inject 0.14-0.18 mLs (14-18 Units total) into the skin 3 (three) times daily before meals. Relion. Please provide insulin syringes needed (Patient taking differently: Inject 10-16 Units into the skin See admin instructions. 16 units in the morning and 10 units at night) 20 mL 11  . Insulin Syringe-Needle U-100 (B-D INS SYRINGE 0.5CC/30GX1/2") 30G X 1/2" 0.5 ML MISC USE  AS DIRECTED 4 TIMES A DAY 300 each 3  . lovastatin (MEVACOR) 40 MG tablet TAKE 1 TABLET BY MOUTH EVERYDAY AT BEDTIME (Patient taking differently: Take 40 mg by mouth at bedtime. ) 90 tablet 3  . mirtazapine (REMERON) 7.5 MG tablet Take 1 tablet (7.5 mg total) by mouth at bedtime. 30 tablet 0  . montelukast (SINGULAIR) 10 MG tablet Take 1 tablet (10 mg total) by mouth at bedtime. 30 tablet 3  . ondansetron (ZOFRAN) 8 MG tablet Take 1 tablet (8 mg total) by mouth 2 (two) times daily as needed (Nausea or vomiting). 30 tablet 1  . polyethylene glycol powder (GLYCOLAX/MIRALAX) 17 GM/SCOOP powder Take 17 g by mouth daily. (Patient taking differently: Take 17 g by mouth as needed for mild constipation or moderate constipation. ) 1 g 0  . traMADol (ULTRAM) 50 MG tablet TAKE 1 TABLET BY MOUTH EVERY 6 HOURS AS NEEDED FOR MODERATE/SEVERE PAIN (NECK AND SHOULDER PAIN). 30 tablet 0  . lidocaine-prilocaine (EMLA) cream Apply to affected area once (Patient not taking: Reported on 03/09/2020) 30 g 3   No current facility-administered medications for this visit.   Objective  Objective:  BP 118/62   Pulse 75   Temp 97.6 F (36.4 C) (Temporal)   Ht 5\' 2"  (1.575 m)   Wt 204 lb (92.5 kg)   LMP  (LMP Unknown)   SpO2 100%   BMI 37.31 kg/m  Gen: NAD, resting comfortably in her chair, tearful at times when discussing cancer CV: RRR no murmurs rubs or gallops Lungs: Decreased breath sounds bilateral bases-decreased at the mid lung field on the right Abdomen: soft/nontender/nondistended/normal bowel sounds. No rebound or guarding.  Ext: no edema Skin: warm, dry    Assessment and Plan:   Shortness of breath Likely multifactorial-combination of asthma exacerbation and malignant pleural effusion which has now been drained twice in the last 2 weeks-she does get significant relief after drainage.  She reports they are considering Pleurx catheter potentially.  She has close follow-up with oncology and pulmonology.   At the moment she seems to be doing better as long as on her oxygen-continue home oxygen and follow-up  Malignant pleural effusion As noted has been drained twice in the last 2 weeks with over 300 mL noted-consideration for Pleurx catheter but apparently does not currently meet criteria-patient will continue to monitor and follow-up with oncology and pulmonology  Metastatic breast cancer [facility]) Unfortunately patient's cancer did not respond well to last round of chemotherapy-she has had progression of disease.  Consideration of hospice-patient is leaning toward continuing treatment for now though and stopping only if she does not tolerate the treatment-she seems to have strong desire to prolong the length of days but we did discuss potential increased quality of life with transition to  hospice.  She is going to continue to discuss things with family and update Dr. Burr Medico within the next week.  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 Lab Results  Component Value Date   HGBA1C 7.2 (H) 03/02/2020  Could likely be more lenient on A1c as we transition to palliative goals for cancer treatment-I honestly think an A1c 8.5 regular 9 would be reasonable but I am also fine keeping her at current levels as long as no hypoglycemia.  Also has close follow-up with endocrinology  Moderate persistent asthma Patient remains on Breo-seems like prior exacerbation has resolved.  Maintains on home oxygen likely more related to malignant pleural effusion  Hypertension associated with diabetes ([place]) Blood pressures controlled on amlodipine 10 mg, carvedilol 25 mg twice daily, clonidine 0.2 mg twice  daily.  Lisinopril and Lasix held in the hospital and we will continue to hold at this time with blood pressure at 118/62.  Patient does have diastolic dysfunction and she may restart Lasix if swelling worsens-may also restart lisinopril if blood pressure increases.  We tentatively discussed a 1 month follow-up or I may simply follow along with her other visits and if blood pressure elevated may make adjustments  Patient with CKD stage III.Patient also had acute kidney injury in the hospital which seems to have resolved with creatinine down from peak 2.2-1.3 yesterday.  I think remaining off lisinopril and Lasix for now is reasonable.  Will not repeat blood work at this time.  She also had some anemia noted yesterday but denies rectal bleeding or dark black stool-we will monitor with next blood work.  At last visit we had kept her off spironolactone-she will also remain off that for now.  Recommended follow up: She would like to consider 1 month follow-up-she will see how she is feeling and what her decisions are with oncology Future Appointments  Date Time Provider [place]  03/11/2020  1:30 PM [facility] LAB 3 [facility] None  03/11/2020  2:00 PM [name], MD [facility] None  03/23/2020 12:00 PM [place] [facility] None  05/31/2020  2:45 PM Mannam, Praveen, MD [facility] None    Lab/Order associations:   ICD-10-CM   1. Shortness of breath  R06.02   2. Malignant pleural effusion  J91.0   3. Metastatic breast cancer ([place])  C50.919   4. Hypertension associated with diabetes ([place])  E11.59    I10   5. 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 Moderate persistent asthma without complication  W65.68   7. Stage 3 chronic kidney disease, unspecified whether stage 3a or 3b CKD  N18.30   8. Diastolic dysfunction  L27.51     Return  precautions advised.  Garret Reddish, MD

## 2020-03-03 NOTE — Plan of Care (Signed)
°  Problem: Education: °Goal: Knowledge of General Education information will improve °Description: Including pain rating scale, medication(s)/side effects and non-pharmacologic comfort measures °Outcome: Progressing °  °Problem: Health Behavior/Discharge Planning: °Goal: Ability to manage health-related needs will improve °Outcome: Progressing °  °Problem: Clinical Measurements: °Goal: Ability to maintain clinical measurements within normal limits will improve °Outcome: Progressing °Goal: Respiratory complications will improve °Outcome: Progressing °Goal: Cardiovascular complication will be avoided °Outcome: Progressing °  °Problem: Activity: °Goal: Risk for activity intolerance will decrease °Outcome: Progressing °  °Problem: Nutrition: °Goal: Adequate nutrition will be maintained °Outcome: Progressing °  °Problem: Elimination: °Goal: Will not experience complications related to bowel motility °Outcome: Progressing °Goal: Will not experience complications related to urinary retention °Outcome: Progressing °  °Problem: Pain Managment: °Goal: General experience of comfort will improve °Outcome: Progressing °  °Problem: Safety: °Goal: Ability to remain free from injury will improve °Outcome: Progressing °  °Problem: Skin Integrity: °Goal: Risk for impaired skin integrity will decrease °Outcome: Progressing °  °

## 2020-03-03 NOTE — Evaluation (Signed)
Physical Therapy Evaluation Patient Details Name: Wendy [name] MRN: [id number] DOB: 08-21-1941 Today's Date: 03/03/2020   History of Present Illness  79 yo female with onset of progressive DOE was admitted, noted wheezing and dry cough as well.  Had thoracentesis on 5/25, now referred to PT for mobility and LUE management.  New areas of mets were found with imaging.  PMHx:  Primary L breast CA, chemotherapy, CKD, HTN, GERD, DM, bony mets in chest, adenocarcinoma, asthma, anemia, anxiety, depression, liver mets, pulm mets  Clinical Impression  Pt was seen for mobility and noted her LUE edema.  Has strength to grip, to extend elbow and IR shoulder, but otherwise is quite weak.  Has true lymphedema wraps at home, and family is willing to try to bring them in to staff.  Will address wrapping with family if supplies are available, and will encourage use of pillow to have elevated LUE otherwise.  Follow acutely for mobility, to wrap LUE and to monitor sats with O2 as needed to maintain her O2 levels.    Follow Up Recommendations Home health PT;Supervision for mobility/OOB;Supervision/Assistance - 24 hour    Equipment Recommendations  Rolling walker with 5" wheels    Recommendations for Other Services       Precautions / Restrictions Precautions Precautions: Fall Precaution Comments: monitor O2 sats with O2 needed Restrictions Weight Bearing Restrictions: No      Mobility  Bed Mobility Overal bed mobility: Needs Assistance Bed Mobility: Supine to Sit;Sit to Supine     Supine to sit: Min assist Sit to supine: Min assist   General bed mobility comments: min assist to support and move trunk but otherwise [facility]  Transfers Overall transfer level: Needs assistance Equipment used: Rolling walker (2 wheeled);1 person hand held assist Transfers: Sit to/from Stand Sit to Stand: Min assist         General transfer comment: min assist to support power up  Ambulation/Gait Ambulation/Gait  assistance: Min guard Gait Distance (Feet): 48 Feet Assistive device: Rolling walker (2 wheeled);1 person hand held assist Gait Pattern/deviations: Step-through pattern;Decreased stride length;Wide base of support;Drifts right/left(pt is a bit weak on LE's and needs mild correction to direct) Gait velocity: reduced Gait velocity interpretation: <1.31 ft/sec, indicative of household ambulator General Gait Details: directional cues needed for safety, and to set limits of distance  Stairs            Wheelchair Mobility    Modified Rankin (Stroke Patients Only)       Balance Overall balance assessment: Needs assistance(no history of falls) Sitting-balance support: Feet supported Sitting balance-Leahy Scale: Good     Standing balance support: Bilateral upper extremity supported;During functional activity Standing balance-Leahy Scale: Fair Standing balance comment: less than fair standing dynamic balance                             Pertinent Vitals/Pain Pain Assessment: No/denies pain    Home Living Family/patient expects to be discharged to:: Private residence Living Arrangements: Children Available Help at Discharge: Family;Available 24 hours/day Type of Home: House       Home Layout: One level Home Equipment: None Additional Comments: may have another family member's walker but not sure    Prior Function Level of Independence: Independent         Comments: has RW but did not use, tended to furniture walk     Hand Dominance   Dominant Hand: Right    Extremity/Trunk  Assessment   Upper Extremity Assessment Upper Extremity Assessment: LUE deficits/detail LUE Deficits / Details: lymphedema with weakness in biceps, to ER and to use wrist LUE Coordination: decreased fine motor;decreased gross motor    Lower Extremity Assessment Lower Extremity Assessment: Generalized weakness    Cervical / Trunk Assessment Cervical / Trunk Assessment:  Kyphotic  Communication   Communication: No difficulties  Cognition Arousal/Alertness: Awake/alert Behavior During Therapy: Flat affect Overall Cognitive Status: Within Functional Limits for tasks assessed                                 General Comments: pt is a little impulsive about gait but not unsafe      General Comments General comments (skin integrity, edema, etc.): pt used O2 since standing with room air brought her to 88%, and used 3L on cannula.  Requires walker for support of balance    Exercises     Assessment/Plan    PT Assessment Patient needs continued PT services  PT Problem List Decreased strength;Decreased range of motion;Decreased activity tolerance;Decreased balance;Decreased mobility;Decreased coordination;Decreased knowledge of use of DME;Decreased safety awareness;Cardiopulmonary status limiting activity;Obesity       PT Treatment Interventions DME instruction;Gait training;Stair training;Functional mobility training;Therapeutic activities;Therapeutic exercise;Balance training;Neuromuscular re-education;Patient/family education    PT Goals (Current goals can be found in the Care Plan section)  Acute Rehab PT Goals Patient Stated Goal: to get home and get L arm wrapped PT Goal Formulation: With patient/family Time For Goal Achievement: 03/17/20 Potential to Achieve Goals: Good    Frequency Min 3X/week   Barriers to discharge Inaccessible home environment has a couple steps to enter house, family there to assist    Co-evaluation               AM-PAC PT "6 Clicks" Mobility  Outcome Measure Help needed turning from your back to your side while in a flat bed without using bedrails?: A Little Help needed moving from lying on your back to sitting on the side of a flat bed without using bedrails?: A Little Help needed moving to and from a bed to a chair (including a wheelchair)?: A Little Help needed standing up from a chair using your  arms (e.g., wheelchair or bedside chair)?: A Little Help needed to walk in hospital room?: A Little Help needed climbing 3-5 steps with a railing? : A Lot 6 Click Score: 17    End of Session Equipment Utilized During Treatment: Gait belt;Oxygen Activity Tolerance: Patient limited by fatigue;Treatment limited secondary to medical complications (Comment) Patient left: in bed;with call bell/phone within reach;with family/visitor present;with bed alarm set Nurse Communication: Mobility status PT Visit Diagnosis: Unsteadiness on feet (R26.81);Muscle weakness (generalized) (M62.81);Difficulty in walking, not elsewhere classified (R26.2)    Time: 6237-6283 PT Time Calculation (min) (ACUTE ONLY): 33 min   Charges:   PT Evaluation $PT Eval Moderate Complexity: 1 Mod PT Treatments $Gait Training: 8-22 mins       Ramond Dial 03/03/2020, 1:26 PM  Mee Hives, PT MS Acute Rehab Dept. Number: [place] and [place]

## 2020-03-03 NOTE — Telephone Encounter (Signed)
Lantus reorder form filled out and faxed back to [employer].

## 2020-03-03 NOTE — Progress Notes (Signed)
This chaplain responded to PMT consult for Pt. Spiritual care.  Pt. stated she is tired and looking forward to resting. Chaplain accepted Pt. request to visit on Thursday.

## 2020-03-03 NOTE — Progress Notes (Signed)
PROGRESS NOTE    Wendy [name]  ZOX:[id number] DOB: 1940/12/31 DOA: 03/01/2020 PCP: Marin Olp, MD   Chef Complaints: Shortness of breath  Brief Narrative: 79 y.o.femalewith medical history significant ofanemia, anxiety, depression, metastatic breast cancer currently on chemo, CKD, hypertension, hyperlipidemia, GERD, insulin-dependent diabetes presenting with complaints of shortness of breath. Patient reports 103-month history of progressively worsening dyspnea on exertion. She is also wheezing and having a dry cough. States her doctor gave her Breo and albuterol which are not helping. States she had fluid drained from her lungs recently but continues to feel short of breath. Denies fevers or chest pain. No other complaints.  Patient was seen by pulmonology on 5/26 and her dyspnea was felt to be related to asthma and pleural effusion from breast cancer recurrence. Patient underwent recent thoracentesis on 5/25 and 320 cc fluid was drained. Cytology showing malignant cells consistent with metastatic adenocarcinoma.  ED Course:Afebrile. Tachypneic with respiratory rate up to mid 20s but not hypoxic. Placed on 2 L supplemental oxygen for comfort. Labs showing no leukocytosis. Creatinine 1.5, at baseline. Initial high-sensitivity troponin 22, repeat stable 19. EKG without acute ischemic changes. SARS-CoV-2 PCR test negative.  CT angiogram chest negative for PE. Showing progression of disease particularly within the lungs, hila, and mediastinum consistent with known history of breast carcinoma with recurrence. Persistent left breast mass is noted with increasing left axillary and left chest wall lymphadenopathy. Bilateral pleural effusions, moderate on the right and small on the left. Bony metastatic disease.  Patient was given albuterol inhaler and continuous neb treatments, Solu-Medrol 60 mg.  TRH consulted for admission for further evaluation and  treatment  Subjective:  c/o some difficulty breathing, sternal discomfort of 3-4 months Eating drinking okay she says. creat and k is up "everytime I take lasix it mess up with my kidney"  Sugar running high and k+ up Getting high-dose steroid   Assessment & Plan:  Shortness of breath suspecting due to acute asthma exacerbation and bilateral pleural effusion: Patient has diminished breath sound, wean IV Solu-Medrol to 40 mg daily lower, has uncontrolled hyperglycemia.  Continue Pulmicort nebs twice a day, DuoNeb every 6 hour and albuterol as needed, home Singulair and supplemental oxygen.  Pleural fluid management as below  Bilateral pleural effusion malignant, moderate on the right and small on the left, status post IR thoracentesis-with 300 blood-tinged pleural fluid removed 6/1.  Thoracentesis on 5/25 was positive for malignant cells in cytology.  Has had recurrent thoracentesis in the setting of malignant pleural effusion may benefit with Pleurx catheter, defer to oncology for further plas. inpatient vs outpatient  Patient was seen by pulmonology for the same o/p  Insulin dependent diabetes mellitus uncontrolled hyperglycemia. A1c stable 7.2.  Increase Lantus to 35 units with additional 7 units today, add premeal 6 units NovoLog and sliding scale insulin coverage.  Monitor.  Appreciate diabetes coordinator input.  CKD stage IIIb-IV: Baseline BUN/creatinine 1.5-2.0.  Stable.  Monitor.  Hyperkalemia at 5.8, likely from hyperglycemia we will optimize insulin, and repeat BMP this afternoon.  Metastatic breast cancer with liver mets, pulmonary mets, malignant pleural effusion: progressing dieasease. Followed by medical oncology, Dr. Burr Medico.  Currently on chemotherapy, but has missed her last 2 doses now.  CT angiogram chest notable for progression of disease in the lungs, hilum, mediastinum with increased left axillary and left chest wall lymph nodes with associated bony and liver metastasis.   Last outpatient visit with Dr. Burr Medico on 02/09/2020 with treatment goals now palliative with  discussions to consider further goals of care. --CT abdomen/pelvis without contrast: No acute abdominal pelvic finding, 3 cm segment 6 liver lesions consistent with metastatic disease  --Palliative care consulted for assistance with goals of care and medical decision making as her cancer seems to be significantly progressed with ultimately poor/grim prognosis.  Essential hypertension: stable. Continue amlodipine 10 mg p.o. daily, carvedilol 25 mg p.o. twice daily- hold furosemide 20 mg  Today.   hold home lisinopril due to high potassium  Hyperlipidemia: Continue pravastatin  Bilateral lower extremity edema-negative for DVT.  Obesity with BMI 32.  DVT prophylaxis:SCD/Heparin/lovenox/DOAC/Coumadin Code Status: DNR  Family Communication: plan of care discussed with patient at bedside.  Status is: admitted as Observation  Will require ongoing hospitalization at least more than 2 midnight for ongoing management of shortness of breath asthma exacerbation, hyperkalemia, being planned for possible pleurex catheter.  We will change the patient inpatient status, discussed with the Utilization team  Dispo: The patient is from: Home              Anticipated d/c is to: Home              Anticipated d/c date is: 2 days              Patient currently is not medically stable to d/c.  Diet Order            Diet heart healthy/carb modified Room service appropriate? Yes; Fluid consistency: Thin  Diet effective now              Body mass index is 32.74 kg/m.  Consultants:see note  Procedures:see note Microbiology:see note  Medications: Scheduled Meds: . amLODipine  10 mg Oral Daily  . budesonide (PULMICORT) nebulizer solution  0.25 mg Nebulization BID  . busPIRone  10 mg Oral TID  . carvedilol  25 mg Oral BID WC  . cloNIDine  0.3 mg Oral BID  . famotidine  20 mg Oral Daily  . furosemide  20 mg  Oral Daily  . insulin aspart  0-15 Units Subcutaneous TID WC  . insulin glargine  28 Units Subcutaneous Daily  . ipratropium-albuterol  3 mL Nebulization BID  . lisinopril  20 mg Oral Daily  . methylPREDNISolone sodium succinate  60 mg Intravenous Q6H  . mirtazapine  7.5 mg Oral QHS  . montelukast  10 mg Oral QHS  . pravastatin  40 mg Oral q1800  . sodium chloride flush  3 mL Intravenous Once   Continuous Infusions:  Antimicrobials: Anti-infectives (From admission, onward)   None       Objective: Vitals: Today's Vitals   03/03/20 0414 03/03/20 0710 03/03/20 0711 03/03/20 0808  BP: (!) 158/87     Pulse: 68 78    Resp: 14 16    Temp: 98.1 F (36.7 C)     TempSrc: Oral     SpO2: 100% 100% 100%   Weight:      Height:      PainSc:    0-No pain    Intake/Output Summary (Last 24 hours) at 03/03/2020 0910 Last data filed at 03/03/2020 0558 Gross per 24 hour  Intake 60 ml  Output 550 ml  Net -490 ml   Filed Weights   03/01/20 2012 03/02/20 0620  Weight: 89.4 kg 81.2 kg   Weight change:    Intake/Output from previous day: 06/01 0701 - 06/02 0700 In: 60 [P.O.:60] Out: 750 [Urine:750] Intake/Output this shift: No intake/output data recorded.  Examination:  General  exam: AAOx3 ,NAD, obese, on RA weak appearing. HEENT:Oral mucosa moist, Ear/Nose WNL grossly,dentition normal. Respiratory system: bilaterally diminished, mild wheezing**,no wheezing or crackles,no use of accessory muscle, non tender. Cardiovascular system: S1 & S2 +, regular, No JVD. Gastrointestinal system: Abdomen soft, NT,ND, BS+. Nervous System:Alert, awake, moving extremities and grossly nonfocal Extremities:  LE edema, distal peripheral pulses palpable.  Skin: No rashes,no icterus. MSK: Normal muscle bulk,tone, power  Data Reviewed: I have personally reviewed following labs and imaging studies CBC: Recent Labs  Lab 03/01/20 1731  WBC 7.3  HGB 12.6  HCT 37.9  MCV 90.0  PLT 856   Basic  Metabolic Panel: Recent Labs  Lab 03/01/20 1731 03/03/20 0435  NA 142 136  K 4.3 5.8*  CL 110 105  CO2 21* 23  GLUCOSE 164* 321*  BUN 15 31*  CREATININE 1.56* 1.86*  CALCIUM 9.4 8.8*  MG  --  2.3   GFR: Estimated Creatinine Clearance: 24.6 mL/min (A) (by C-G formula based on SCr of 1.86 mg/dL (H)). Liver Function Tests: Recent Labs  Lab 03/02/20 0932  PROT 6.7   No results for input(s): LIPASE, AMYLASE in the last 168 hours. No results for input(s): AMMONIA in the last 168 hours. Coagulation Profile: No results for input(s): INR, PROTIME in the last 168 hours. Cardiac Enzymes: No results for input(s): CKTOTAL, CKMB, CKMBINDEX, TROPONINI in the last 168 hours. BNP (last 3 results) Recent Labs    09/15/19 1552  PROBNP CANCELED   HbA1C: Recent Labs    03/02/20 0932  HGBA1C 7.2*   CBG: Recent Labs  Lab 03/02/20 0637 03/02/20 1150 03/02/20 1652 03/02/20 2122 03/03/20 0748  GLUCAP 204* 198* 363* 392* 342*   Lipid Profile: No results for input(s): CHOL, HDL, LDLCALC, TRIG, CHOLHDL, LDLDIRECT in the last 72 hours. Thyroid Function Tests: No results for input(s): TSH, T4TOTAL, FREET4, T3FREE, THYROIDAB in the last 72 hours. Anemia Panel: No results for input(s): VITAMINB12, FOLATE, FERRITIN, TIBC, IRON, RETICCTPCT in the last 72 hours. Sepsis Labs: No results for input(s): PROCALCITON, LATICACIDVEN in the last 168 hours.  Recent Results (from the past 240 hour(s))  SARS Coronavirus 2 (TAT 6-24 hrs)     Status: None   Collection Time: 02/23/20  9:19 AM   Specimen: Nasopharyngeal Swab  Result Value Ref Range Status   SARS Coronavirus 2 NEGATIVE NEGATIVE Final    Comment: (NOTE) SARS-CoV-2 target nucleic acids are NOT DETECTED. The SARS-CoV-2 RNA is generally detectable in upper and lower respiratory specimens during the acute phase of infection. Negative results do not preclude SARS-CoV-2 infection, do not rule out co-infections with other pathogens, and  should not be used as the sole basis for treatment or other patient management decisions. Negative results must be combined with clinical observations, patient history, and epidemiological information. The expected result is Negative. Fact Sheet for Patients: SugarRoll.be Fact Sheet for Healthcare Providers: https://www.woods-mathews.com/ This test is not yet approved or cleared by the [place] FDA and  has been authorized for detection and/or diagnosis of SARS-CoV-2 by FDA under an Emergency Use Authorization (EUA). This EUA will remain  in effect (meaning this test can be used) for the duration of the COVID-19 declaration under Section 56 4(b)(1) of the Act, 21 U.S.C. section 360bbb-3(b)(1), unless the authorization is terminated or revoked sooner. Performed at [facility], [place] [address] [place], [place] [address]   Culture, body fluid-bottle     Status: None   Collection Time: 02/24/20  4:07 PM  Specimen: Fluid  Result Value Ref Range Status   Specimen Description FLUID PLEURAL  Final   Special Requests BOTTLES DRAWN AEROBIC AND ANAEROBIC  Final   Culture   Final    NO GROWTH 5 DAYS Performed at [facility], [address], [place], [place] [address]    Report Status 02/29/2020 FINAL  Final  Gram stain     Status: None   Collection Time: 02/24/20  4:07 PM   Specimen: Fluid  Result Value Ref Range Status   Specimen Description FLUID PLEURAL  Final   Special Requests NONE  Final   Gram Stain   Final    FEW WBC PRESENT, PREDOMINANTLY MONONUCLEAR NO ORGANISMS SEEN Performed at [facility], [place] [address] [place], [place] [address]    Report Status 02/25/2020 FINAL  Final  SARS Coronavirus 2 by RT PCR (hospital order, performed in [facility] hospital lab) Nasopharyngeal Nasopharyngeal Swab     Status: None   Collection Time: 03/02/20  3:34 AM   Specimen: Nasopharyngeal Swab  Result Value Ref Range Status    SARS Coronavirus 2 NEGATIVE NEGATIVE Final    Comment: (NOTE) SARS-CoV-2 target nucleic acids are NOT DETECTED. The SARS-CoV-2 RNA is generally detectable in upper and lower respiratory specimens during the acute phase of infection. The lowest concentration of SARS-CoV-2 viral copies this assay can detect is 250 copies / mL. A negative result does not preclude SARS-CoV-2 infection and should not be used as the sole basis for treatment or other patient management decisions.  A negative result may occur with improper specimen collection / handling, submission of specimen other than nasopharyngeal swab, presence of viral mutation(s) within the areas targeted by this assay, and inadequate number of viral copies (<250 copies / mL). A negative result must be combined with clinical observations, patient history, and epidemiological information. Fact Sheet for Patients:   StrictlyIdeas.no Fact Sheet for Healthcare Providers: BankingDealers.co.za This test is not yet approved or cleared  by the [place] FDA and has been authorized for detection and/or diagnosis of SARS-CoV-2 by FDA under an Emergency Use Authorization (EUA).  This EUA will remain in effect (meaning this test can be used) for the duration of the COVID-19 declaration under Section 564(b)(1) of the Act, 21 U.S.C. section 360bbb-3(b)(1), unless the authorization is terminated or revoked sooner. Performed at [facility], [place] [address] [place], [place] [address]   Body fluid culture     Status: None (Preliminary result)   Collection Time: 03/02/20  2:09 PM   Specimen: PATH Cytology Pleural fluid  Result Value Ref Range Status   Specimen Description FLUID PLEURAL  Final   Special Requests NONE  Final   Gram Stain   Final    RARE WBC PRESENT, PREDOMINANTLY MONONUCLEAR NO ORGANISMS SEEN    Culture   Final    NO GROWTH < 24 HOURS Performed at [facility],  [place] [address] [place], [place] [address]    Report Status PENDING  Incomplete      Radiology Studies: CT ABDOMEN PELVIS WO CONTRAST  Result Date: 03/02/2020 CLINICAL DATA:  Abdominal distension. History of malignancy. Evaluate for possible bowel obstruction. EXAM: CT ABDOMEN AND PELVIS WITHOUT CONTRAST TECHNIQUE: Multidetector CT imaging of the abdomen and pelvis was performed following the standard protocol without IV contrast. COMPARISON:  Chest CT, same date and prior abdominal CT scan 01/17/2018 FINDINGS: Lower chest: There are bilateral pleural effusions and numerous small metastatic pulmonary nodules.  The heart is normal in size. Small amount of pericardial fluid without overt effusion. Hepatobiliary: 3 cm segment 6 liver lesion again demonstrated. No other obvious hepatic lesions without contrast. The gallbladder appears normal. No intra or extrahepatic biliary dilatation. Pancreas: Advanced pancreatic atrophy but no mass or inflammation. Spleen: Normal size.  No focal lesions. Adrenals/Urinary Tract: The adrenal glands are unremarkable. Some residual contrast in the collecting system of both kidneys from the recent chest CT. Prior numerous bilateral renal cysts but no worrisome renal lesions or hydronephrosis. The bladder contains contrast. No bladder mass or asymmetric bladder wall thickening. A mild to moderate cystocele is noted. Stomach/Bowel: The stomach, duodenum, small bowel and colon are unremarkable. No acute inflammatory changes, mass lesions or obstructive findings. The terminal ileum and appendix are normal. Contrast gets all the way to the colon and there are no dilated small bowel loops. Vascular/Lymphatic: Scattered atherosclerotic calcifications involving the aorta and iliac arteries but no aneurysm. No mesenteric or retroperitoneal mass or lymphadenopathy. Reproductive: Enlarged fibroid uterus with calcified fibroids. The ovaries are normal. Other: No abdominal wall hernia or  subcutaneous lesions are identified. No free abdominal/pelvic fluid collections. Musculoskeletal: No significant bony findings. IMPRESSION: 1. No acute abdominal/pelvic findings or lymphadenopathy. No findings for small bowel obstruction. 2. 3 cm segment 6 liver lesion consistent with metastatic disease. 3. Enlarged fibroid uterus with calcified fibroids. 4. Bilateral pleural effusions and numerous small metastatic pulmonary nodules. Aortic Atherosclerosis (ICD10-I70.0). Electronically Signed   By: Marijo Sanes M.D.   On: 03/02/2020 10:58   DG Chest 1 View  Result Date: 03/02/2020 CLINICAL DATA:  Status post thoracentesis. Additional history provided: Post right thoracentesis, patient still having some shortness of breath, no unusual pain. EXAM: CHEST  1 VIEW COMPARISON:  CT angiogram chest 10/03/2019, chest radiograph 03/01/2020. FINDINGS: Unchanged cardiomediastinal silhouette. Interval decrease in size of a right pleural effusion consistent with provided history of interval thoracentesis. Unchanged trace left pleural effusion. Multiple pulmonary nodules were better appreciated on chest CT performed earlier the same day. No evidence of pneumothorax. Surgical clips within the left axilla. Known osseous metastatic disease better appreciated on same-day chest CT. IMPRESSION: Interval decrease in conspicuity of a right pleural effusion consistent with interval thoracentesis. No evidence of pneumothorax. Additional findings without interval change as described. Electronically Signed   By: Kellie Simmering DO   On: 03/02/2020 14:12   DG Chest 2 View  Result Date: 03/01/2020 CLINICAL DATA:  Patient reports sob and cough with chest congestion for 3 months. Reports thoracentesis 2 days ago. EXAM: CHEST - 2 VIEW COMPARISON:  Chest radiograph 01/26/2020, 02/24/2020 FINDINGS: Stable cardiomediastinal contours. There is a small right pleural effusion with adjacent opacities possibly representing atelectasis. There is a  small nonspecific opacity in the right mid lung which appears new from prior. The left lung is clear. No pneumothorax. No acute finding in the visualized skeleton. IMPRESSION: 1. Small right pleural effusion with adjacent opacities possibly representing atelectasis. 2. New small focal opacity in the right mid lung is nonspecific, possibly atelectasis though early infection not excluded. Electronically Signed   By: Audie Pinto M.D.   On: 03/01/2020 17:50   CT Angio Chest PE W and/or Wo Contrast  Result Date: 03/02/2020 CLINICAL DATA:  Shortness of breath, history of recent thoracentesis EXAM: CT ANGIOGRAPHY CHEST WITH CONTRAST TECHNIQUE: Multidetector CT imaging of the chest was performed using the standard protocol during bolus administration of intravenous contrast. Multiplanar CT image reconstructions and MIPs were obtained to evaluate the vascular  anatomy. CONTRAST:  2mL OMNIPAQUE IOHEXOL 350 MG/ML SOLN COMPARISON:  Chest x-ray from the previous day, CT from 10/31/2019, PET-CT from 01/07/2020 FINDINGS: Cardiovascular: Thoracic aorta and its branches are well visualize without aneurysmal dilatation or dissection. No cardiac enlargement is seen. Mild pericardial effusion is noted. The pulmonary artery shows a normal branching pattern. No definitive filling defect to suggest pulmonary embolism is seen. Mediastinum/Nodes: Thoracic inlet shows lymphadenopathy in the left supraclavicular region as well as posterior to the clavicle on the left. Scattered mediastinal lymph nodes are noted in the pre-vascular region as well as AP window slightly increased when compared with the prior exam. Additionally there are hilar lymph nodes identified as well as subcarinal adenopathy increased from the prior study. Subcarinal lymph node measures at least 2.1 cm in short axis and previously measured approximately 1 cm in short axis on prior PET-CT. Right hilar lymph node measures 2.1 cm in short axis also increased from the  prior study. Significant bulky adenopathy is noted in the left axilla as well as a focal soft tissue mass lesion which measures 5.1 by 2.5 cm best seen on image number 136 of series 7. This is consistent with recurrent disease and is similar to that seen on recent PET-CT. Bulky lymph nodes are noted associated with this mass which appear increased when compared with the prior exam consistent with progression of disease. Multiple subpectoral lymph nodes are noted on the left also consistent with progressive disease. Lungs/Pleura: Small left pleural effusion is noted consistent with the recent thoracentesis. Moderate right-sided pleural effusion is noted. The lungs demonstrate multiple parenchymal nodules which have increased in size and number when compared with the prior exam. Largest of these on the left is posterior along the pleural margin measuring 1.9 cm. The largest of these on the right is seen in the upper lobe measuring approximately 11 mm. Right lower lobe consolidation is noted of a mild degree. Upper Abdomen: Visualized upper abdomen demonstrates multiple renal cysts. Additionally there is a rounded hypodensity in the posterior aspect of the right lobe of the liver best seen on image number 354 of series 7 consistent with metastatic disease. This is stable from the prior PET-CT. Musculoskeletal: Degenerative changes of the thoracic spine are noted. Medial scapular metastatic lesion is noted on the left similar to that seen on prior PET-CT. Pathologic fracture is again noted involving the right eighth rib posteriorly. Some mottled areas of bony density are noted within the ribcage consistent with metastatic disease. Review of the MIP images confirms the above findings. IMPRESSION: No evidence of pulmonary emboli. Progression of disease particularly within the lungs, hila and mediastinum consistent with the known history of breast carcinoma with recurrence. Persistent left breast mass is noted with  increasing left axillary and left chest wall lymphadenopathy. Bilateral pleural effusions right greater than left. Bony metastatic disease. Electronically Signed   By: Inez Catalina M.D.   On: 03/02/2020 01:41   VAS [name] LOWER EXTREMITY VENOUS (DVT)  Result Date: 03/02/2020  Lower Venous DVTStudy Indications: Pain, and Edema.  Limitations: Pain, poor ultrasound/tissue interface and body habitus. Performing Technologist: Antonieta Pert RDMS, RVT  Examination Guidelines: A complete evaluation includes B-mode imaging, spectral Doppler, color Doppler, and power Doppler as needed of all accessible portions of each vessel. Bilateral testing is considered an integral part of a complete examination. Limited examinations for reoccurring indications may be performed as noted. The reflux portion of the exam is performed with the patient in reverse Trendelenburg.  +---------+---------------+---------+-----------+----------+-------------------+ RIGHT  CompressibilityPhasicitySpontaneityPropertiesThrombus Aging      +---------+---------------+---------+-----------+----------+-------------------+ CFV      Full           Yes      Yes                                      +---------+---------------+---------+-----------+----------+-------------------+ SFJ      Full                                                             +---------+---------------+---------+-----------+----------+-------------------+ FV Prox  Full                                                             +---------+---------------+---------+-----------+----------+-------------------+ FV Mid   Full                                                             +---------+---------------+---------+-----------+----------+-------------------+ FV Distal               Yes      Yes                  could not tolerate                                                        compression          +---------+---------------+---------+-----------+----------+-------------------+ PFV      Full                                                             +---------+---------------+---------+-----------+----------+-------------------+ POP                                                   Not visualized      +---------+---------------+---------+-----------+----------+-------------------+ PTV                     Yes                           could not tolerate  compression         +---------+---------------+---------+-----------+----------+-------------------+ PERO                    Yes                           could not tolerate                                                        compression         +---------+---------------+---------+-----------+----------+-------------------+ GSV      Full                                                             +---------+---------------+---------+-----------+----------+-------------------+   +---------+---------------+---------+-----------+----------+-------------------+ LEFT     CompressibilityPhasicitySpontaneityPropertiesThrombus Aging      +---------+---------------+---------+-----------+----------+-------------------+ CFV      Full           Yes      Yes                                      +---------+---------------+---------+-----------+----------+-------------------+ SFJ      Full                                                             +---------+---------------+---------+-----------+----------+-------------------+ FV Prox                 Yes                           could not tolerate                                                        compression         +---------+---------------+---------+-----------+----------+-------------------+ FV Mid                  Yes                           could not tolerate                                                         compression         +---------+---------------+---------+-----------+----------+-------------------+ FV Distal               Yes      Yes  could not tolerate                                                        compression         +---------+---------------+---------+-----------+----------+-------------------+ POP                     Yes      Yes                  could not tolerate                                                        compression         +---------+---------------+---------+-----------+----------+-------------------+ PTV                     Yes                           could not tolerate                                                        compression         +---------+---------------+---------+-----------+----------+-------------------+ PERO                    Yes                           could not tolerate                                                        compression         +---------+---------------+---------+-----------+----------+-------------------+ GSV      Full                                                             +---------+---------------+---------+-----------+----------+-------------------+     Summary: RIGHT: - There is no evidence of deep vein thrombosis in the lower extremity. However, portions of this examination were limited- see technologist comments above.  - No cystic structure found in the popliteal fossa.  LEFT: - There is no evidence of deep vein thrombosis in the lower extremity. However, portions of this examination were limited- see technologist comments above.  - No cystic structure found in the popliteal fossa. - Patient could not tolerate compression and almost did not tolerate any touch. best images possible.  *See table(s) above for measurements and observations. Electronically signed by Deitra Mayo MD on  03/02/2020 at 6:04:43 PM.    Final    IR  THORACENTESIS ASP PLEURAL SPACE W/IMG GUIDE  Result Date: 03/02/2020 INDICATION: SHORTNESS OF BREATH, RIGHT PLEURAL EFFUSION EXAM: ULTRASOUND GUIDED RIGHT THORACENTESIS MEDICATIONS: 1% lidocaine local COMPLICATIONS: None immediate. PROCEDURE: An ultrasound guided thoracentesis was thoroughly discussed with the patient and questions answered. The benefits, risks, alternatives and complications were also discussed. The patient understands and wishes to proceed with the procedure. Written consent was obtained. Ultrasound was performed to localize and mark an adequate pocket of fluid in the right chest. The area was then prepped and draped in the normal sterile fashion. 1% Lidocaine was used for local anesthesia. Under ultrasound guidance a 6 Fr Safe-T-Centesis catheter was introduced. Thoracentesis was performed. The catheter was removed and a dressing applied. FINDINGS: A total of approximately 300 cc of blood tinged pleural fluid was removed. Samples were sent to the laboratory as requested by the clinical team. IMPRESSION: Successful ultrasound guided right thoracentesis yielding 300 cc of pleural fluid. Electronically Signed   By: Jerilynn Mages.  Shick M.D.   On: 03/02/2020 15:02     LOS: 0 days   Antonieta Pert, MD [facility]  03/03/2020, 9:10 AM

## 2020-03-03 NOTE — Patient Instructions (Addendum)
Health Maintenance Due  Topic Date Due  . COVID-19 Vaccine (1) completed will call with dates and manufacture.  Never done   Great to see you today today. I am so sorry things have not gone as planned- we are here for you as needed.   Watch for swelling increasing and can restart lasix if needed. Also if blood pressure trends up at home can also restart lisinopril. If it is high at other offices can also send me a message

## 2020-03-03 NOTE — Progress Notes (Addendum)
Daily Progress Note   Patient Name: Wendy [name]       Date: 03/03/2020 DOB: 11-03-1940  Age: 79 y.o. MRN#: [id number] Attending Physician: Antonieta Pert, MD Primary Care Physician: Marin Olp, MD Admit Date: 03/01/2020  Reason for Consultation/Follow-up: Establishing goals of care  Subjective: Met with patient, her son- Marta Antu, her future daughter in law, by bedside and with daughter [name] by phone.  We discussed Ms Disch past and present quality of life- she used to own a daycare- she continues to be beloved by they children she cared for. She gets joy from her family, from the relationships in her life.  We discussed her cancer and current illness state. Education was provided to her and her family regarding her cancer and metastasis.  GOC was discussed- Ms. Vallas and family are coming to a new understanding that her cancer is progressive, and the goal of chemotherapy is to delay the progression but will not cure it. As long as she can walk, continue to interact with her family and "be happy", then she wishes to continue chemotherapy. She notes she would like to be able to go shopping and get out in her flower box, but she realizes she has not been able to do these things in several years and will likely not be able to get back to that definition of living. At home she is ambulatory but it is recently inhibited by her shortness of breath. She requires help dressing and bathing. She spends most of her days in her bed or in her chair. She is hopeful that having the PleurX cath and drain placed with relieve some of her shortness of breath, break the anxiety/SOB cycle and allow her to be more active. We also discussed using low dose morphine and she is agreeable to this.  She notes depression at  times. Talking it out with others, distraction with her son are helpful- she is interested in resources for counseling.  She has lymphedema in her L extremity. She has seen physical therapy in the past for this.  Code status was discussed- she would not want CPR or to be put on a ventilator if her heart stopped and she stopped breathing- but she wishes for all interventions to keep her heart beating and to keep her alive.  Hospice and  Palliative services were discussed. She and her family understand that if she should change her goals of care and wish to focus on quality/comfort rather than prolonging her time, then Hospice would be appropriate and we reviewed the philosophy and services offered by Hospice.    Review of Systems  Respiratory: Positive for shortness of breath.   Cardiovascular: Positive for leg swelling.    Length of Stay: 0  Current Medications: Scheduled Meds:   amLODipine  10 mg Oral Daily   budesonide (PULMICORT) nebulizer solution  0.25 mg Nebulization BID   busPIRone  10 mg Oral TID   carvedilol  25 mg Oral BID WC   cloNIDine  0.3 mg Oral BID   famotidine  20 mg Oral Daily   insulin aspart  0-15 Units Subcutaneous TID WC   insulin glargine  28 Units Subcutaneous Daily   ipratropium-albuterol  3 mL Nebulization BID   lisinopril  20 mg Oral Daily   methylPREDNISolone sodium succinate  60 mg Intravenous Q6H   mirtazapine  7.5 mg Oral QHS   montelukast  10 mg Oral QHS   pravastatin  40 mg Oral q1800   sodium chloride flush  3 mL Intravenous Once    Continuous Infusions:   PRN Meds: acetaminophen **OR** acetaminophen, albuterol, HYDROcodone-acetaminophen, lidocaine (PF), morphine, ondansetron, polyethylene glycol  Physical Exam Vitals and nursing note reviewed.  Constitutional:      Appearance: Normal appearance.  HENT:     Head: Normocephalic and atraumatic.  Cardiovascular:     Rate and Rhythm: Normal rate.     Comments: Left upper  extremity lymphedema Pulmonary:     Breath sounds: Wheezing present.  Neurological:     Mental Status: She is alert and oriented to person, place, and time.             Vital Signs: BP (!) 158/87 (BP Location: Right Arm)    Pulse 78    Temp 98.1 F (36.7 C) (Oral)    Resp 16    Ht 5' 2" (1.575 m)    Wt 81.2 kg    LMP  (LMP Unknown)    SpO2 100%    BMI 32.74 kg/m  SpO2: SpO2: 100 % O2 Device: O2 Device: Nasal Cannula O2 Flow Rate: O2 Flow Rate (L/min): 3 L/min  Intake/output summary:   Intake/Output Summary (Last 24 hours) at 03/03/2020 1117 Last data filed at 03/03/2020 0900 Gross per 24 hour  Intake 260 ml  Output 550 ml  Net -290 ml   LBM: Last BM Date: 03/03/20 Baseline Weight: Weight: 89.4 kg Most recent weight: Weight: 81.2 kg       Palliative Assessment/Data: PPS: 40%      Patient Active Problem List   Diagnosis Date Noted   Asthma exacerbation 03/02/2020   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   Metastatic breast cancer (HCC) 03/02/2020   Dyspnea    Advanced care planning/counseling discussion    Moderate persistent asthma 01/31/2020   Allergic rhinitis 01/31/2020   GERD (gastroesophageal reflux disease) 01/31/2020   Goals of care, counseling/discussion 01/08/2020   Lymphedema of left arm 09/03/2019   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[place]) 12/05/2018   Grief 05/10/2018   Anemia 01/09/2018   Fatigue 03/14/2017   Edema 03/14/2017   Shortness of breath 03/14/2017   Port catheter in place 01/18/2017   Rectal bleeding 01/17/2017   Constipation 01/17/2017   Malignant neoplasm of upper-outer quadrant of left breast in female, estrogen receptor positive ([place]) 11/20/2016   CKD (  chronic kidney disease) stage 3, GFR 30-59 ml/min (HCC) 03/17/2016   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 Workup reveals pleural effusions and CT scans are concerning for progression of cancer. Palliative medicine consulted for discussion of goals of care given current findings.    Assessment/Recommendations/Plan   Stage IV breast cancer- unclear if progression is in spite of treatment or not enough opportunity for treatment to have effect- she started on 4/20 was scheduled to receive 3 weekly treatments but has only had one so far due to her shortness of breath- she is hopeful that having pleurx drain placed will allow her to tolerate more chemotherapy treatments  Recommend inpatient pulmonology consult for Pleurx to be placed before she is discharged  Shortness of breath- morphine 2.68m po q2hr prn- recommend use as premed before activity  Left upper extremity edema- has history of lymphedema and has seen PT for this in the past- will request PT consult- likely to also benefit from at home PT  DNR  [facility]up [facility]- will discuss  Spiritual support, anxiety, depression related to illness state- Chaplain consult, TOC requested to provide patient with counseling information   Goals of Care and Additional Recommendations:  Limitations on Scope  of Treatment: Full Scope Treatment  Code Status:  DNR  Prognosis:   Unable to determine  Discharge Planning:  Home with Home Health and Palliative  Care plan was discussed with patient and her family.  Thank you for allowing the Palliative Medicine Team to assist in the care of this patient.   Time In: 1000 Time Out: 1120 Total Time 80 mins Prolonged Time Billed no      Greater than 50%  of this time was spent counseling and coordinating care related to the above assessment and plan.  KMariana Kaufman AGNP-C Palliative Medicine   Please contact Palliative Medicine Team phone at [phone] questions and concerns.

## 2020-03-04 ENCOUNTER — Telehealth: Payer: Self-pay | Admitting: Hematology

## 2020-03-04 DIAGNOSIS — Z7189 Other specified counseling: Secondary | ICD-10-CM

## 2020-03-04 DIAGNOSIS — Z515 Encounter for palliative care: Secondary | ICD-10-CM

## 2020-03-04 LAB — BASIC METABOLIC PANEL
Anion gap: 9 (ref 5–15)
BUN: 49 mg/dL — ABNORMAL HIGH (ref 8–23)
CO2: 21 mmol/L — ABNORMAL LOW (ref 22–32)
Calcium: 8.5 mg/dL — ABNORMAL LOW (ref 8.9–10.3)
Chloride: 106 mmol/L (ref 98–111)
Creatinine, Ser: 2.24 mg/dL — ABNORMAL HIGH (ref 0.44–1.00)
GFR calc Af Amer: 24 mL/min — ABNORMAL LOW (ref >60)
GFR calc non Af Amer: 20 mL/min — ABNORMAL LOW (ref >60)
Glucose, Bld: 196 mg/dL — ABNORMAL HIGH (ref 70–99)
Potassium: 5.4 mmol/L — ABNORMAL HIGH (ref 3.5–5.1)
Sodium: 136 mmol/L (ref 135–145)

## 2020-03-04 LAB — GLUCOSE, CAPILLARY
Glucose-Capillary: 210 mg/dL — ABNORMAL HIGH (ref 70–99)
Glucose-Capillary: 289 mg/dL — ABNORMAL HIGH (ref 70–99)
Glucose-Capillary: 312 mg/dL — ABNORMAL HIGH (ref 70–99)
Glucose-Capillary: 283 mg/dL — ABNORMAL HIGH (ref 70–99)

## 2020-03-04 LAB — FLOW CYTOMETRY REQUEST - FLUID (INPATIENT)

## 2020-03-04 MED ORDER — SODIUM ZIRCONIUM CYCLOSILICATE 10 G PO PACK
10.0000 g | PACK | Freq: Once | ORAL | Status: AC
  Administered 2020-03-04: 10 g via ORAL
  Filled 2020-03-04: qty 1

## 2020-03-04 MED ORDER — METHYLPREDNISOLONE SODIUM SUCC 125 MG IJ SOLR: 40.0000 mg | INTRAMUSCULAR | Status: DC

## 2020-03-04 NOTE — Plan of Care (Signed)

## 2020-03-04 NOTE — Telephone Encounter (Signed)
I called pt's daughter-in-law August Luz, she is a Therapist, sports at [facility]. We reviewed Wendy [name]'s overall poor prognosis, and she understands very well. I was not able to go to [facility] to meet them today, and her husband (pt's son) and she are planning to come with Hannah on her next office visit on Monday, and we will review the situation and discuss management options, especially hospice.   [name]  03/04/2020

## 2020-03-04 NOTE — Progress Notes (Signed)
PROGRESS NOTE    Wendy [name]  LJQ:[id number] DOB: 06-22-1941 DOA: 03/01/2020 PCP: Marin Olp, MD   Chef Complaints: Shortness of breath  Brief Narrative: 79 y.o.femalewith medical history significant ofanemia, anxiety, depression, metastatic breast cancer currently on chemo, CKD, hypertension, hyperlipidemia, GERD, insulin-dependent diabetes presenting with complaints of shortness of breath. Patient reports 22-month history of progressively worsening dyspnea on exertion. She is also wheezing and having a dry cough. States her doctor gave her Breo and albuterol which are not helping.States she had fluid drained from her lungs recently but continues to feel short of breath.Denies fevers or chest pain.No other complaints.  Patient was seen by pulmonology on 5/26 and her dyspnea was felt to be related to asthma and pleural effusion from breast cancer recurrence. Patient underwent recent thoracentesis on 5/25 and 320 cc fluid was drained. Cytology showing malignant cells consistent with metastatic adenocarcinoma.  ED Course:Afebrile. Tachypneic with respiratory rate up to mid 20s but not hypoxic. Placed on 2 L supplemental oxygen for comfort. Labs showing no leukocytosis. Creatinine 1.5, at baseline. Initial high-sensitivity troponin 22, repeat stable 19. EKG without acute ischemic changes. SARS-CoV-2 PCR test negative.  CT angiogram chest negative for PE. Showing progression of disease particularly within the lungs, hila, and mediastinum consistent with known history of breast carcinoma with recurrence. Persistent left breast mass is noted with increasing left axillary and left chest wall lymphadenopathy. Bilateral pleural effusions, moderate on the right and small on the left. Bony metastatic disease.  Patient was given albuterol inhaler and continuous neb treatments, Solu-Medrol 60 mg.  TRH consulted for admission for further evaluation and treatment  Subjective:  No  shortness of breath but reports has not gotten up. Having her meal, on RA. Not in distress. k is up again and creat is worsening "everytime I take lasix it mess up with my kidney"   Assessment & Plan:  Shortness of breath due to acute asthma exacerbation bilateral pleural effusion: Asthma exacerbation improving will decrease steroid to daily dose, continue Pulmicort nebs twice a day, DuoNeb every 6 hour and albuterol as needed, home Singulair and supplemental oxygen.  Pleural fluid management as below  Malignant pleural effusion, bilateral: Status post IR thoracentesis with 300 blood-tinged fluid removed 6/1.  Last thoracentesis on 5/25 was positive for malignant cells in cytology.Has had recurrent thoracentesis in the setting of malignant pleural effusion may benefit with Pleurx catheter-Dr. Annamaria Boots discussed with IR since last volume is about 30 normal no indication for current inpatient Pleurx catheter may be in the future and will be followed up as outpatient.  I had consulted pulmonary team too.   Insulin-dependent diabetes mellitus with uncontrolled hyperglycemia.  Stable A1c.  Lantus was increased to 35 units and on Premeal NovoLog and sliding scale insulin likely uncontrolled from steroid.  As we try to wean down steroid will monitor blood sugar and adjust insulin.  Seen by diabetes coordinator.  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  Was around 1.5 slowly uptrending, Lasix losartan on hold and continue IV fluid hydration.  BUN/creatinine in a.m.  Hyperkalemia still high despite IV fluids and optimizing her blood sugar.  Treat with Lokelma x1.    Metastatic breast cancer with liver mets, pulmonary mets, malignant pleural effusion: progressing dieasease. Followed by medical oncology, Dr. Burr Medico.  Currently on chemotherapy, but has missed her last 2 doses now.  CT angiogram chest notable for progression of disease in the lungs, hilum, mediastinum with increased left axillary and  left chest  wall lymph nodes with associated bony and liver metastasis.  Last outpatient visit with Dr. Burr Medico on 02/09/2020 with treatment goals now palliative with discussions to consider further goals of care. --CT abdomen/pelvis without contrast: No acute abdominal pelvic finding, 3 cm segment 6 liver lesions consistent with metastatic disease  --Palliative care consulted for assistance with goals of care and medical decision making as her cancer seems to be significantly progressed with ultimately poor/grim prognosis.  Essential hypertension: stable.Continue amlodipine 10 mg p.o. daily, carvedilol 25 mg p.o. twice daily- hold furosemide 20 mg,hold home lisinopril due to high potassium  Hyperlipidemia: Continue pravastatin  Bilateral lower extremity edema-negative for DVT.  Obesity with BMI 32.  DVT prophylaxis:SCD-held chemical prophylaxis in the setting of procedure/thoracenetesis Code Status: DNR  Family Communication: plan of care discussed with patient at bedside.  I discussed with her oncologist Dr. Burr Medico who is going to discuss with family.  Status UX:LKGMWNU to inpatient Patient remains inpatient for ongoing IV fluid needs monitoring of renal function.  Dispo: The patient is from: Home              Anticipated d/c is to: Home w/ [facility]              Anticipated d/c date is: 1-2 days              Patient currently is not medically stable to d/c.  Diet Order            Diet heart healthy/carb modified Room service appropriate? Yes; Fluid consistency: Thin  Diet effective now              Body mass index is 32.74 kg/m.  Consultants:see note  Procedures:see note Microbiology:see note  Medications: Scheduled Meds: . amLODipine  10 mg Oral Daily  . budesonide (PULMICORT) nebulizer solution  0.25 mg Nebulization BID  . busPIRone  10 mg Oral TID  . carvedilol  25 mg Oral BID WC  . cloNIDine  0.3 mg Oral BID  . famotidine  20 mg Oral Daily  . insulin aspart  0-15 Units  Subcutaneous TID WC  . insulin aspart  6 Units Subcutaneous TID WC  . insulin glargine  35 Units Subcutaneous Daily  . ipratropium-albuterol  3 mL Nebulization BID  . methylPREDNISolone sodium succinate  40 mg Intravenous Q12H  . mirtazapine  7.5 mg Oral QHS  . montelukast  10 mg Oral QHS  . pravastatin  40 mg Oral q1800  . sodium chloride flush  3 mL Intravenous Once   Continuous Infusions: . sodium chloride 125 mL/hr at 03/04/20 0816    Antimicrobials: Anti-infectives (From admission, onward)   None       Objective: Vitals: Today's Vitals   03/03/20 2053 03/04/20 0300 03/04/20 0419 03/04/20 0754  BP:   120/74   Pulse:   76   Resp:   18   Temp:   97.9 F (36.6 C)   TempSrc:      SpO2:   100% 100%  Weight:      Height:      PainSc: 0-No pain Asleep      Intake/Output Summary (Last 24 hours) at 03/04/2020 0834 Last data filed at 03/04/2020 0422 Gross per 24 hour  Intake 1397.03 ml  Output 1800 ml  Net -402.97 ml   Filed Weights   03/01/20 2012 03/02/20 0620  Weight: 89.4 kg 81.2 kg   Weight change:    Intake/Output from previous day: 06/02 0701 - 06/03 0700 In:  1397 [P.O.:750; I.V.:647] Out: 1800 [Urine:1800] Intake/Output this shift: No intake/output data recorded.  Examination:  General exam: AAOx3, weak, on ra, NAD HEENT:Oral mucosa moist, Ear/Nose WNL grossly, dentition normal. Respiratory system: bilaterally diminished at base,no wheezing or crackles,no use of accessory muscle Cardiovascular system: S1 & S2 +, No JVD,. Gastrointestinal system: Abdomen soft, NT,ND, BS+ Nervous System:Alert, awake, moving extremities and grossly nonfocal Extremities: Non pitting ankle edema, distal peripheral pulses palpable.  Skin: No rashes,no icterus. MSK: Normal muscle bulk,tone, power  Data Reviewed: I have personally reviewed following labs and imaging studies CBC: Recent Labs  Lab 03/01/20 1731  WBC 7.3  HGB 12.6  HCT 37.9  MCV 90.0  PLT 510   Basic  Metabolic Panel: Recent Labs  Lab 03/01/20 1731 03/03/20 0435 03/03/20 1503 03/04/20 0143  NA 142 136 136 136  K 4.3 5.8* 5.2* 5.4*  CL 110 105 106 106  CO2 21* 23 21* 21*  GLUCOSE 164* 321* 213* 196*  BUN 15 31* 41* 49*  CREATININE 1.56* 1.86* 2.06* 2.24*  CALCIUM 9.4 8.8* 8.6* 8.5*  MG  --  2.3  --   --    GFR: Estimated Creatinine Clearance: 20.4 mL/min (A) (by C-G formula based on SCr of 2.24 mg/dL (H)). Liver Function Tests: Recent Labs  Lab 03/02/20 0932  PROT 6.7   No results for input(s): LIPASE, AMYLASE in the last 168 hours. No results for input(s): AMMONIA in the last 168 hours. Coagulation Profile: No results for input(s): INR, PROTIME in the last 168 hours. Cardiac Enzymes: No results for input(s): CKTOTAL, CKMB, CKMBINDEX, TROPONINI in the last 168 hours. BNP (last 3 results) Recent Labs    09/15/19 1552  PROBNP CANCELED   HbA1C: Recent Labs    03/02/20 0932  HGBA1C 7.2*   CBG: Recent Labs  Lab 03/03/20 0748 03/03/20 1147 03/03/20 1647 03/03/20 2346 03/04/20 0813  GLUCAP 342* 380* 152* 175* 210*   Lipid Profile: No results for input(s): CHOL, HDL, LDLCALC, TRIG, CHOLHDL, LDLDIRECT in the last 72 hours. Thyroid Function Tests: No results for input(s): TSH, T4TOTAL, FREET4, T3FREE, THYROIDAB in the last 72 hours. Anemia Panel: No results for input(s): VITAMINB12, FOLATE, FERRITIN, TIBC, IRON, RETICCTPCT in the last 72 hours. Sepsis Labs: No results for input(s): PROCALCITON, LATICACIDVEN in the last 168 hours.  Recent Results (from the past 240 hour(s))  SARS Coronavirus 2 (TAT 6-24 hrs)     Status: None   Collection Time: 02/23/20  9:19 AM   Specimen: Nasopharyngeal Swab  Result Value Ref Range Status   SARS Coronavirus 2 NEGATIVE NEGATIVE Final    Comment: (NOTE) SARS-CoV-2 target nucleic acids are NOT DETECTED. The SARS-CoV-2 RNA is generally detectable in upper and lower respiratory specimens during the acute phase of infection.  Negative results do not preclude SARS-CoV-2 infection, do not rule out co-infections with other pathogens, and should not be used as the sole basis for treatment or other patient management decisions. Negative results must be combined with clinical observations, patient history, and epidemiological information. The expected result is Negative. Fact Sheet for Patients: SugarRoll.be Fact Sheet for Healthcare Providers: https://www.woods-mathews.com/ This test is not yet approved or cleared by the [place] FDA and  has been authorized for detection and/or diagnosis of SARS-CoV-2 by FDA under an Emergency Use Authorization (EUA). This EUA will remain  in effect (meaning this test can be used) for the duration of the COVID-19 declaration under Section 56 4(b)(1) of the Act, 21 U.S.C. section 360bbb-3(b)(1), unless the  authorization is terminated or revoked sooner. Performed at [facility], [place] [address], [place], [place] [address]   Culture, body fluid-bottle     Status: None   Collection Time: 02/24/20  4:07 PM   Specimen: Fluid  Result Value Ref Range Status   Specimen Description FLUID PLEURAL  Final   Special Requests BOTTLES DRAWN AEROBIC AND ANAEROBIC  Final   Culture   Final    NO GROWTH 5 DAYS Performed at [facility], [place] [address] [place], [place] [address]    Report Status 02/29/2020 FINAL  Final  Gram stain     Status: None   Collection Time: 02/24/20  4:07 PM   Specimen: Fluid  Result Value Ref Range Status   Specimen Description FLUID PLEURAL  Final   Special Requests NONE  Final   Gram Stain   Final    FEW WBC PRESENT, PREDOMINANTLY MONONUCLEAR NO ORGANISMS SEEN Performed at [facility], [place] [address], [place], [place] [address]    Report Status 02/25/2020 FINAL  Final  SARS Coronavirus 2 by RT PCR (hospital order, performed in [facility] hospital lab) Nasopharyngeal Nasopharyngeal Swab      Status: None   Collection Time: 03/02/20  3:34 AM   Specimen: Nasopharyngeal Swab  Result Value Ref Range Status   SARS Coronavirus 2 NEGATIVE NEGATIVE Final    Comment: (NOTE) SARS-CoV-2 target nucleic acids are NOT DETECTED. The SARS-CoV-2 RNA is generally detectable in upper and lower respiratory specimens during the acute phase of infection. The lowest concentration of SARS-CoV-2 viral copies this assay can detect is 250 copies / mL. A negative result does not preclude SARS-CoV-2 infection and should not be used as the sole basis for treatment or other patient management decisions.  A negative result may occur with improper specimen collection / handling, submission of specimen other than nasopharyngeal swab, presence of viral mutation(s) within the areas targeted by this assay, and inadequate number of viral copies (<250 copies / mL). A negative result must be combined with clinical observations, patient history, and epidemiological information. Fact Sheet for Patients:   StrictlyIdeas.no Fact Sheet for Healthcare Providers: BankingDealers.co.za This test is not yet approved or cleared  by the [place] FDA and has been authorized for detection and/or diagnosis of SARS-CoV-2 by FDA under an Emergency Use Authorization (EUA).  This EUA will remain in effect (meaning this test can be used) for the duration of the COVID-19 declaration under Section 564(b)(1) of the Act, 21 U.S.C. section 360bbb-3(b)(1), unless the authorization is terminated or revoked sooner. Performed at [facility], [place] [address], [place], [place] [address]   Body fluid culture     Status: None (Preliminary result)   Collection Time: 03/02/20  2:09 PM   Specimen: PATH Cytology Pleural fluid  Result Value Ref Range Status   Specimen Description FLUID PLEURAL  Final   Special Requests NONE  Final   Gram Stain   Final    RARE WBC PRESENT, PREDOMINANTLY  MONONUCLEAR NO ORGANISMS SEEN    Culture   Final    NO GROWTH < 24 HOURS Performed at [facility], [place] [address], [place], [place] [address]    Report Status PENDING  Incomplete      Radiology Studies: CT ABDOMEN PELVIS WO CONTRAST  Result Date: 03/02/2020 CLINICAL DATA:  Abdominal distension. History of malignancy. Evaluate for possible bowel obstruction. EXAM: CT ABDOMEN AND PELVIS WITHOUT CONTRAST TECHNIQUE: Multidetector CT imaging  of the abdomen and pelvis was performed following the standard protocol without IV contrast. COMPARISON:  Chest CT, same date and prior abdominal CT scan 01/17/2018 FINDINGS: Lower chest: There are bilateral pleural effusions and numerous small metastatic pulmonary nodules. The heart is normal in size. Small amount of pericardial fluid without overt effusion. Hepatobiliary: 3 cm segment 6 liver lesion again demonstrated. No other obvious hepatic lesions without contrast. The gallbladder appears normal. No intra or extrahepatic biliary dilatation. Pancreas: Advanced pancreatic atrophy but no mass or inflammation. Spleen: Normal size.  No focal lesions. Adrenals/Urinary Tract: The adrenal glands are unremarkable. Some residual contrast in the collecting system of both kidneys from the recent chest CT. Prior numerous bilateral renal cysts but no worrisome renal lesions or hydronephrosis. The bladder contains contrast. No bladder mass or asymmetric bladder wall thickening. A mild to moderate cystocele is noted. Stomach/Bowel: The stomach, duodenum, small bowel and colon are unremarkable. No acute inflammatory changes, mass lesions or obstructive findings. The terminal ileum and appendix are normal. Contrast gets all the way to the colon and there are no dilated small bowel loops. Vascular/Lymphatic: Scattered atherosclerotic calcifications involving the aorta and iliac arteries but no aneurysm. No mesenteric or retroperitoneal mass or lymphadenopathy. Reproductive:  Enlarged fibroid uterus with calcified fibroids. The ovaries are normal. Other: No abdominal wall hernia or subcutaneous lesions are identified. No free abdominal/pelvic fluid collections. Musculoskeletal: No significant bony findings. IMPRESSION: 1. No acute abdominal/pelvic findings or lymphadenopathy. No findings for small bowel obstruction. 2. 3 cm segment 6 liver lesion consistent with metastatic disease. 3. Enlarged fibroid uterus with calcified fibroids. 4. Bilateral pleural effusions and numerous small metastatic pulmonary nodules. Aortic Atherosclerosis (ICD10-I70.0). Electronically Signed   By: Marijo Sanes M.D.   On: 03/02/2020 10:58   DG Chest 1 View  Result Date: 03/02/2020 CLINICAL DATA:  Status post thoracentesis. Additional history provided: Post right thoracentesis, patient still having some shortness of breath, no unusual pain. EXAM: CHEST  1 VIEW COMPARISON:  CT angiogram chest 10/03/2019, chest radiograph 03/01/2020. FINDINGS: Unchanged cardiomediastinal silhouette. Interval decrease in size of a right pleural effusion consistent with provided history of interval thoracentesis. Unchanged trace left pleural effusion. Multiple pulmonary nodules were better appreciated on chest CT performed earlier the same day. No evidence of pneumothorax. Surgical clips within the left axilla. Known osseous metastatic disease better appreciated on same-day chest CT. IMPRESSION: Interval decrease in conspicuity of a right pleural effusion consistent with interval thoracentesis. No evidence of pneumothorax. Additional findings without interval change as described. Electronically Signed   By: Kellie Simmering DO   On: 03/02/2020 14:12   VAS [name] LOWER EXTREMITY VENOUS (DVT)  Result Date: 03/02/2020  Lower Venous DVTStudy Indications: Pain, and Edema.  Limitations: Pain, poor ultrasound/tissue interface and body habitus. Performing Technologist: Antonieta Pert RDMS, RVT  Examination Guidelines: A complete evaluation  includes B-mode imaging, spectral Doppler, color Doppler, and power Doppler as needed of all accessible portions of each vessel. Bilateral testing is considered an integral part of a complete examination. Limited examinations for reoccurring indications may be performed as noted. The reflux portion of the exam is performed with the patient in reverse Trendelenburg.  +---------+---------------+---------+-----------+----------+-------------------+ RIGHT    CompressibilityPhasicitySpontaneityPropertiesThrombus Aging      +---------+---------------+---------+-----------+----------+-------------------+ CFV      Full           Yes      Yes                                      +---------+---------------+---------+-----------+----------+-------------------+  SFJ      Full                                                             +---------+---------------+---------+-----------+----------+-------------------+ FV Prox  Full                                                             +---------+---------------+---------+-----------+----------+-------------------+ FV Mid   Full                                                             +---------+---------------+---------+-----------+----------+-------------------+ FV Distal               Yes      Yes                  could not tolerate                                                        compression         +---------+---------------+---------+-----------+----------+-------------------+ PFV      Full                                                             +---------+---------------+---------+-----------+----------+-------------------+ POP                                                   Not visualized      +---------+---------------+---------+-----------+----------+-------------------+ PTV                     Yes                           could not tolerate                                                         compression         +---------+---------------+---------+-----------+----------+-------------------+ PERO                    Yes                           could not  tolerate                                                        compression         +---------+---------------+---------+-----------+----------+-------------------+ GSV      Full                                                             +---------+---------------+---------+-----------+----------+-------------------+   +---------+---------------+---------+-----------+----------+-------------------+ LEFT     CompressibilityPhasicitySpontaneityPropertiesThrombus Aging      +---------+---------------+---------+-----------+----------+-------------------+ CFV      Full           Yes      Yes                                      +---------+---------------+---------+-----------+----------+-------------------+ SFJ      Full                                                             +---------+---------------+---------+-----------+----------+-------------------+ FV Prox                 Yes                           could not tolerate                                                        compression         +---------+---------------+---------+-----------+----------+-------------------+ FV Mid                  Yes                           could not tolerate                                                        compression         +---------+---------------+---------+-----------+----------+-------------------+ FV Distal               Yes      Yes                  could not tolerate  compression         +---------+---------------+---------+-----------+----------+-------------------+ POP                     Yes      Yes                  could not tolerate                                                         compression         +---------+---------------+---------+-----------+----------+-------------------+ PTV                     Yes                           could not tolerate                                                        compression         +---------+---------------+---------+-----------+----------+-------------------+ PERO                    Yes                           could not tolerate                                                        compression         +---------+---------------+---------+-----------+----------+-------------------+ GSV      Full                                                             +---------+---------------+---------+-----------+----------+-------------------+     Summary: RIGHT: - There is no evidence of deep vein thrombosis in the lower extremity. However, portions of this examination were limited- see technologist comments above.  - No cystic structure found in the popliteal fossa.  LEFT: - There is no evidence of deep vein thrombosis in the lower extremity. However, portions of this examination were limited- see technologist comments above.  - No cystic structure found in the popliteal fossa. - Patient could not tolerate compression and almost did not tolerate any touch. best images possible.  *See table(s) above for measurements and observations. Electronically signed by Deitra Mayo MD on 03/02/2020 at 6:04:43 PM.    Final    IR THORACENTESIS ASP PLEURAL SPACE W/IMG GUIDE  Result Date: 03/02/2020 INDICATION: SHORTNESS OF BREATH, RIGHT PLEURAL EFFUSION EXAM: ULTRASOUND GUIDED RIGHT THORACENTESIS MEDICATIONS: 1% lidocaine local COMPLICATIONS: None immediate. PROCEDURE: An ultrasound guided thoracentesis was thoroughly discussed with the patient and questions answered. The benefits, risks, alternatives and complications were also discussed. The patient understands and wishes to  proceed with the procedure.  Written consent was obtained. Ultrasound was performed to localize and mark an adequate pocket of fluid in the right chest. The area was then prepped and draped in the normal sterile fashion. 1% Lidocaine was used for local anesthesia. Under ultrasound guidance a 6 Fr Safe-T-Centesis catheter was introduced. Thoracentesis was performed. The catheter was removed and a dressing applied. FINDINGS: A total of approximately 300 cc of blood tinged pleural fluid was removed. Samples were sent to the laboratory as requested by the clinical team. IMPRESSION: Successful ultrasound guided right thoracentesis yielding 300 cc of pleural fluid. Electronically Signed   By: Jerilynn Mages.  Shick M.D.   On: 03/02/2020 15:02     LOS: 1 day   Antonieta Pert, MD [facility]  03/04/2020, 8:34 AM

## 2020-03-04 NOTE — Progress Notes (Signed)
This chaplain attempted F/U spiritual care visit with the Pt.  The Pt.son-Desmond is visiting at this time. The chaplain will return later today.

## 2020-03-04 NOTE — Progress Notes (Signed)
PT NOTE Reviewed chart and see request for lymphedema wraps for RUE.  With patient's current medical status, do not feel lymphedema wraps are indicated at this time.  Patient with current pleural effusion.  Lymphedema wrapping would push fluid from arms to chest which would increase fluid in this area.  Recommend patient be re-evaluated for wrapping once more stable or drains inserted/managed.  She can seek treatment at [facility] or see if any [facility] agency has staff that can assist with lymphedema wrapping.  If patient reached more stable status while in hospital, we can re-evaluate.  Thank you, 03/04/2020 Lesleigh Noe, PT Acute Rehabilitation Services Pager:  [phone] Office:  [phone]

## 2020-03-04 NOTE — TOC Initial Note (Addendum)
Transition of Care [facility]) - Initial/Assessment Note    Patient Details  Name: Wendy [name] MRN: [id number] Date of Birth: Sep 21, 1941  [facility] CM/SW Contact:    Marilu Favre, RN Phone Number: 03/04/2020, 10:39 AM  Clinical Narrative:                 Confirmed face sheet information with patient and son Dasman at bedside. DAsman's girl friend is [id number] [phone]   HYQMVHQ PT recommendations for HHPT and 24 hour supervision. Family can provide 24/7 supervision.   Discussed plan for bilateral pleurx chest tubes. Patient will have a nurse , however nurse will not be able to make daily visits, patient and family will be taught care prior to discharge from hospital. Both voiced understanding.   Patient does need a walker, ordered through [facility].  Expected Discharge Plan: [place] Barriers to Discharge: Continued Medical Work up   Patient Goals and CMS Choice Patient states their goals for this hospitalization and ongoing recovery are:: to return to home CMS Medicare.gov Compare Post Acute Care list provided to:: Patient Choice offered to / list presented to : Patient, Adult Children  Expected Discharge Plan and Services Expected Discharge Plan: [place]   Discharge Planning Services: CM Consult Post Acute Care Choice: Home Health, Durable Medical Equipment Living arrangements for the past 2 months: Single Family Home                 DME Arranged: Walker rolling DME Agency: [employer] Date DME Agency Contacted: 03/04/20 Time DME Agency Contacted: 47 Representative spoke with at DME Agency: [place] Arranged: PT, RN          Prior Living Arrangements/Services Living arrangements for the past 2 months: [place] with:: Adult Children Patient language and need for interpreter reviewed:: Yes Do you feel safe going back to the place where you live?: Yes      Need for Family Participation in  Patient Care: Yes (Comment) Care giver support system in place?: Yes (comment)   Criminal Activity/Legal Involvement Pertinent to Current Situation/Hospitalization: No - Comment as needed  Activities of Daily Living Home Assistive Devices/Equipment: CBG Meter ADL Screening (condition at time of admission) Patient's cognitive ability adequate to safely complete daily activities?: Yes Is the patient deaf or have difficulty hearing?: No Does the patient have difficulty seeing, even when wearing glasses/contacts?: No Does the patient have difficulty concentrating, remembering, or making decisions?: No Patient able to express need for assistance with ADLs?: Yes Does the patient have difficulty dressing or bathing?: No Independently performs ADLs?: Yes (appropriate for developmental age) Weakness of Legs: None Weakness of Arms/Hands: None  Permission Sought/Granted   Permission granted to share information with : Yes, Verbal Permission Granted  Share Information with NAME: Avaeh Ewer son           Emotional Assessment Appearance:: Appears stated age Attitude/Demeanor/Rapport: Engaged Affect (typically observed): Accepting Orientation: : Oriented to Self, Oriented to Place, Oriented to  Time, Oriented to Situation Alcohol / Substance Use: Not Applicable Psych Involvement: No (comment)  Admission diagnosis:  Exacerbation of RAD (reactive airway disease) [J45.901] 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 Asthma exacerbation 03/02/2020  . 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 Metastatic breast cancer ([place]) 03/02/2020  . Dyspnea   . Advanced care planning/counseling discussion   . Moderate persistent asthma  01/31/2020  . Allergic rhinitis 01/31/2020  . GERD (gastroesophageal reflux disease) 01/31/2020  . Goals of care, counseling/discussion 01/08/2020  . Lymphedema of left arm 09/03/2019  . Major  depression in full remission ([place]) 12/05/2018  . Grief 05/10/2018  . Anemia 01/09/2018  . Fatigue 03/14/2017  . Edema 03/14/2017  . Shortness of breath 03/14/2017  . Port catheter in place 01/18/2017  . Rectal bleeding 01/17/2017  . Constipation 01/17/2017  . Malignant neoplasm of upper-outer quadrant of left breast in female, estrogen receptor positive ([place]) 11/20/2016  . CKD (chronic kidney disease) stage 3, GFR 30-59 ml/min ([facility]) 03/17/2016  . 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 Seasonal allergies 12/01/2013  . Diastolic dysfunction 35/78/9784  . TOBACCO USE 04/13/2008  . Hyperlipidemia associated with type 2 diabetes mellitus ([place]) 11/29/2006  . OBESITY, BMI 30-35 11/29/2006  . Hypertension associated with diabetes ([place]) 11/29/2006   PCP:  Marin Olp, MD Pharmacy:   [id number] - [address] [place] [address] Phone: [phone] Fax: [phone]  [place] - [place], [place] - [place] [address] Phone: [phone] Fax: [phone]  [place], [place] - [place] [address] Phone: [phone] Fax: [phone]     Social Determinants of Health (SDOH) Interventions    Readmission Risk Interventions No flowsheet data found.

## 2020-03-04 NOTE — Progress Notes (Signed)
Name: Wendy [name] MRN: [id number] DOB: 1941-07-27    ADMISSION DATE:  03/01/2020 CONSULTATION DATE: 03/04/2020  REFERRING MD : [facility]  CHIEF COMPLAINT: Dyspnea  BRIEF PATIENT DESCRIPTION: 79 year old female with recurrent bilateral malignant effusions  SIGNIFICANT EVENTS    STUDIES:     HISTORY OF PRESENT ILLNESS:   80 year old female with a history of breast cancer, hypertension, chronic kidney disease, hyperlipidemia, diastolic heart failure.  Her breast cancer is metastatic in nature with bilateral malignant effusions but has had multiple thoracentesis in the past.  She is followed by Dr. Vaughan Browner of pulmonary and Dr.Feng of oncology.  There have been discussions in the past with Dr. Vaughan Browner concerning the placement of bilateral Pleurx chest tube for palliation of dyspnea from recurrent bilateral pleural effusion.  She is readmitted to [facility] 03/02/2020 with diagnosis of dyspnea recurrent pleural effusions.  She has had bilateral thoracentesis right and left last on 6 1 20,021 with 300 cc from right, andthoracentesis the left was 320 cc 02/24/2020. Pulmonary critical care was asked to evaluate on 03/04/2020 due to reoccurring pleural effusions and the possibilities of placement of Pleurx catheters bilaterally as a palliative measure for comfort.  Patient is aware of the palliation nature of the chest tube.  And wishes to proceed with Pleurx catheter placement.  Unfortunately pulmonary critical care does not have the manpower to place chest tubes at this time.  Therefore we would recommend interventional radiology to put place bilateral Pleurx catheters.  PAST MEDICAL HISTORY :   has a past medical history of Abnormal breast finding (2018), Anemia, Anxiety, Breast cancer ([place]) (11/2016), CKD (chronic kidney disease), Depression, GERD (gastroesophageal reflux disease), HTN (hypertension), Hyperlipidemia, Hyperplastic colon polyp, HYPERTENSION, BENIGN SYSTEMIC (11/29/2006), Insulin  dependent diabetes mellitus, Personal history of chemotherapy, and Personal history of radiation therapy.  has a past surgical history that includes Breast lumpectomy with radioactive seed and sentinel lymph node biopsy (Left, 12/07/2016); Re-excision of breast lumpectomy (Left, 12/28/2016); IR FLUORO GUIDE PORT INSERTION RIGHT (01/04/2017); IR US Guide Vasc Access Right (01/04/2017); Port-a-cath removal (N/A, 05/30/2017); Breast lumpectomy (Left, 05/05/2008); Breast lumpectomy (Left, 12/07/2016); Axillary lymph node dissection (Left, 08/05/2019); and IR THORACENTESIS ASP PLEURAL SPACE W/IMG GUIDE (03/02/2020). Prior to Admission medications   Medication Sig Start Date End Date Taking? Authorizing Provider  albuterol (VENTOLIN HFA) 108 (90 Base) MCG/ACT inhaler Inhale 2 puffs into the lungs every 6 (six) hours as needed for wheezing or shortness of breath. 10/21/19  Yes Marin Olp, MD  amLODipine (NORVASC) 10 MG tablet TAKE 1 TABLET BY MOUTH EVERY DAY Patient taking differently: Take 10 mg by mouth daily.  09/22/19  Yes Marin Olp, MD  busPIRone (BUSPAR) 10 MG tablet Take 1 tablet (10 mg total) by mouth 3 (three) times daily. 10/16/19  Yes Marin Olp, MD  carvedilol (COREG) 25 MG tablet Take 1 tablet (25 mg total) by mouth 2 (two) times daily with a meal. 10/07/19  Yes Marin Olp, MD  cloNIDine (CATAPRES) 0.3 MG tablet TAKE 1 TABLET (0.3 MG TOTAL) BY MOUTH 2 (TWO) TIMES DAILY. 02/16/20  Yes Marin Olp, MD  famotidine (PEPCID) 20 MG tablet TAKE 1 TABLET BY MOUTH EVERY DAY Patient taking differently: Take 20 mg by mouth daily.  02/23/20  Yes Martyn Ehrich, NP  fluticasone (FLONASE) 50 MCG/ACT nasal spray Place 1 spray into both nostrils daily. 01/30/20  Yes Martyn Ehrich, NP  fluticasone furoate-vilanterol (BREO ELLIPTA) 100-25 MCG/INH AEPB Inhale 1 puff into the  lungs daily. 02/25/20  Yes Mannam, Praveen, MD  furosemide (LASIX) 20 MG tablet Take 1 tablet (20 mg total) by mouth  daily. 02/17/20  Yes Marin Olp, MD  HYDROcodone-acetaminophen (NORCO/VICODIN) 5-325 MG tablet Take 1 tablet by mouth every 8 (eight) hours as needed for moderate pain. 08/20/19  Yes [name], MD  Insulin Glargine (LANTUS SOLOSTAR) 100 UNIT/ML Solostar Pen Inject 28 units into the skin in am Patient taking differently: Inject 28 Units into the skin daily.  08/11/19  Yes Philemon Kingdom, MD  insulin regular (NOVOLIN R) 100 units/mL injection Inject 0.14-0.18 mLs (14-18 Units total) into the skin 3 (three) times daily before meals. Relion. Please provide insulin syringes needed Patient taking differently: Inject 10-16 Units into the skin See admin instructions. 16 units in the morning and 10 units at night 08/11/19  Yes Philemon Kingdom, MD  lisinopril (ZESTRIL) 20 MG tablet Take 1 tablet (20 mg total) by mouth daily. 02/17/20  Yes Marin Olp, MD  lovastatin (MEVACOR) 40 MG tablet TAKE 1 TABLET BY MOUTH EVERYDAY AT BEDTIME Patient taking differently: Take 40 mg by mouth at bedtime.  09/22/19  Yes Marin Olp, MD  mirtazapine (REMERON) 7.5 MG tablet Take 1 tablet (7.5 mg total) by mouth at bedtime. 01/08/20  Yes [name], MD  montelukast (SINGULAIR) 10 MG tablet Take 1 tablet (10 mg total) by mouth at bedtime. 01/30/20  Yes Martyn Ehrich, NP  ondansetron (ZOFRAN) 8 MG tablet Take 1 tablet (8 mg total) by mouth 2 (two) times daily as needed (Nausea or vomiting). 01/08/20  Yes [name], MD  polyethylene glycol powder (GLYCOLAX/MIRALAX) 17 GM/SCOOP powder Take 17 g by mouth daily. Patient taking differently: Take 17 g by mouth as needed for mild constipation or moderate constipation.  05/22/19  Yes Pyrtle, Lajuan Lines, MD  traMADol (ULTRAM) 50 MG tablet TAKE 1 TABLET BY MOUTH EVERY 6 HOURS AS NEEDED FOR MODERATE/SEVERE PAIN (NECK AND SHOULDER PAIN). Patient taking differently: Take 50 mg by mouth every 6 (six) hours as needed for moderate pain or severe pain (neck and shoulder pain).  01/14/20   Yes Marin Olp, MD  B-D INS SYRINGE 0.5CC/31GX5/16 31G X 5/16" 0.5 ML MISC USE AS DIRECTED 4 TIMES A DAY 08/18/16   Philemon Kingdom, MD  fluticasone furoate-vilanterol (BREO ELLIPTA) 200-25 MCG/INH AEPB Inhale 1 puff into the lungs daily. Patient not taking: Reported on 03/01/2020 02/25/20   Marshell Garfinkel, MD  Insulin Pen Needle 31G X 5 MM MISC Use pen needles for insulin injection daily 12/12/16   Philemon Kingdom, MD  Insulin Syringe-Needle U-100 (B-D INS SYRINGE 0.5CC/30GX1/2") 30G X 1/2" 0.5 ML MISC USE AS DIRECTED 4 TIMES A DAY 08/15/16   Philemon Kingdom, MD  lidocaine-prilocaine (EMLA) cream Apply to affected area once Patient not taking: Reported on 02/17/2020 01/08/20   [name], MD  predniSONE (DELTASONE) 10 MG tablet Take 2 tabs x 5 days Patient not taking: Reported on 03/01/2020 01/30/20   Martyn Ehrich, NP   No Known Allergies  FAMILY HISTORY:  family history includes Arthritis in her sister; Asthma in her child; Cancer (age of onset: 44) in her father; Diabetes in her brother, child, child, child, paternal aunt, paternal grandmother, sister, and sister; Emphysema in her brother; Heart attack in her brother; Heart disease in her brother; Heart failure in her sister; Hypertension in her child and sister. SOCIAL HISTORY:  reports that she has never smoked. She quit smokeless tobacco use about 2 years  ago.  Her smokeless tobacco use included snuff. She reports that she does not drink alcohol or use drugs.  REVIEW OF SYSTEMS:   10 point review of system taken, please see HPI for positives and negatives.   SUBJECTIVE:   VITAL SIGNS: Temp:  [97.7 F (36.5 C)-98.4 F (36.9 C)] 97.9 F (36.6 C) (06/03 0419) Pulse Rate:  [75-108] 76 (06/03 0419) Resp:  [14-20] 18 (06/03 0419) BP: (120-131)/(62-92) 120/74 (06/03 0419) SpO2:  [98 %-100 %] 100 % (06/03 0754)  PHYSICAL EXAMINATION: General: Obese female who is currently on room air no acute distress Neuro: Grossly  intact no focal defects noted HEENT: No JVD or lymphadenopathy is appreciated Cardiovascular: Heart sounds are regular regular rate rhythm Lungs: Diminished in the bases dull to percussion bilateral bases Abdomen: Obese soft nontender Musculoskeletal: Grossly intact Skin: Warm and dry  Recent Labs  Lab 03/03/20 0435 03/03/20 1503 03/04/20 0143  NA 136 136 136  K 5.8* 5.2* 5.4*  CL 105 106 106  CO2 23 21* 21*  BUN 31* 41* 49*  CREATININE 1.86* 2.06* 2.24*  GLUCOSE 321* 213* 196*   Recent Labs  Lab 03/01/20 1731  HGB 12.6  HCT 37.9  WBC 7.3  PLT 371   CT ABDOMEN PELVIS WO CONTRAST  Result Date: 03/02/2020 CLINICAL DATA:  Abdominal distension. History of malignancy. Evaluate for possible bowel obstruction. EXAM: CT ABDOMEN AND PELVIS WITHOUT CONTRAST TECHNIQUE: Multidetector CT imaging of the abdomen and pelvis was performed following the standard protocol without IV contrast. COMPARISON:  Chest CT, same date and prior abdominal CT scan 01/17/2018 FINDINGS: Lower chest: There are bilateral pleural effusions and numerous small metastatic pulmonary nodules. The heart is normal in size. Small amount of pericardial fluid without overt effusion. Hepatobiliary: 3 cm segment 6 liver lesion again demonstrated. No other obvious hepatic lesions without contrast. The gallbladder appears normal. No intra or extrahepatic biliary dilatation. Pancreas: Advanced pancreatic atrophy but no mass or inflammation. Spleen: Normal size.  No focal lesions. Adrenals/Urinary Tract: The adrenal glands are unremarkable. Some residual contrast in the collecting system of both kidneys from the recent chest CT. Prior numerous bilateral renal cysts but no worrisome renal lesions or hydronephrosis. The bladder contains contrast. No bladder mass or asymmetric bladder wall thickening. A mild to moderate cystocele is noted. Stomach/Bowel: The stomach, duodenum, small bowel and colon are unremarkable. No acute inflammatory  changes, mass lesions or obstructive findings. The terminal ileum and appendix are normal. Contrast gets all the way to the colon and there are no dilated small bowel loops. Vascular/Lymphatic: Scattered atherosclerotic calcifications involving the aorta and iliac arteries but no aneurysm. No mesenteric or retroperitoneal mass or lymphadenopathy. Reproductive: Enlarged fibroid uterus with calcified fibroids. The ovaries are normal. Other: No abdominal wall hernia or subcutaneous lesions are identified. No free abdominal/pelvic fluid collections. Musculoskeletal: No significant bony findings. IMPRESSION: 1. No acute abdominal/pelvic findings or lymphadenopathy. No findings for small bowel obstruction. 2. 3 cm segment 6 liver lesion consistent with metastatic disease. 3. Enlarged fibroid uterus with calcified fibroids. 4. Bilateral pleural effusions and numerous small metastatic pulmonary nodules. Aortic Atherosclerosis (ICD10-I70.0). Electronically Signed   By: Marijo Sanes M.D.   On: 03/02/2020 10:58   DG Chest 1 View  Result Date: 03/02/2020 CLINICAL DATA:  Status post thoracentesis. Additional history provided: Post right thoracentesis, patient still having some shortness of breath, no unusual pain. EXAM: CHEST  1 VIEW COMPARISON:  CT angiogram chest 10/03/2019, chest radiograph 03/01/2020. FINDINGS: Unchanged cardiomediastinal silhouette. Interval  decrease in size of a right pleural effusion consistent with provided history of interval thoracentesis. Unchanged trace left pleural effusion. Multiple pulmonary nodules were better appreciated on chest CT performed earlier the same day. No evidence of pneumothorax. Surgical clips within the left axilla. Known osseous metastatic disease better appreciated on same-day chest CT. IMPRESSION: Interval decrease in conspicuity of a right pleural effusion consistent with interval thoracentesis. No evidence of pneumothorax. Additional findings without interval change as  described. Electronically Signed   By: Kellie Simmering DO   On: 03/02/2020 14:12   VAS [name] LOWER EXTREMITY VENOUS (DVT)  Result Date: 03/02/2020  Lower Venous DVTStudy Indications: Pain, and Edema.  Limitations: Pain, poor ultrasound/tissue interface and body habitus. Performing Technologist: Antonieta Pert RDMS, RVT  Examination Guidelines: A complete evaluation includes B-mode imaging, spectral Doppler, color Doppler, and power Doppler as needed of all accessible portions of each vessel. Bilateral testing is considered an integral part of a complete examination. Limited examinations for reoccurring indications may be performed as noted. The reflux portion of the exam is performed with the patient in reverse Trendelenburg.  +---------+---------------+---------+-----------+----------+-------------------+ RIGHT    CompressibilityPhasicitySpontaneityPropertiesThrombus Aging      +---------+---------------+---------+-----------+----------+-------------------+ CFV      Full           Yes      Yes                                      +---------+---------------+---------+-----------+----------+-------------------+ SFJ      Full                                                             +---------+---------------+---------+-----------+----------+-------------------+ FV Prox  Full                                                             +---------+---------------+---------+-----------+----------+-------------------+ FV Mid   Full                                                             +---------+---------------+---------+-----------+----------+-------------------+ FV Distal               Yes      Yes                  could not tolerate                                                        compression         +---------+---------------+---------+-----------+----------+-------------------+ PFV      Full                                                              +---------+---------------+---------+-----------+----------+-------------------+  POP                                                   Not visualized      +---------+---------------+---------+-----------+----------+-------------------+ PTV                     Yes                           could not tolerate                                                        compression         +---------+---------------+---------+-----------+----------+-------------------+ PERO                    Yes                           could not tolerate                                                        compression         +---------+---------------+---------+-----------+----------+-------------------+ GSV      Full                                                             +---------+---------------+---------+-----------+----------+-------------------+   +---------+---------------+---------+-----------+----------+-------------------+ LEFT     CompressibilityPhasicitySpontaneityPropertiesThrombus Aging      +---------+---------------+---------+-----------+----------+-------------------+ CFV      Full           Yes      Yes                                      +---------+---------------+---------+-----------+----------+-------------------+ SFJ      Full                                                             +---------+---------------+---------+-----------+----------+-------------------+ FV Prox                 Yes                           could not tolerate  compression         +---------+---------------+---------+-----------+----------+-------------------+ FV Mid                  Yes                           could not tolerate                                                        compression         +---------+---------------+---------+-----------+----------+-------------------+ FV  Distal               Yes      Yes                  could not tolerate                                                        compression         +---------+---------------+---------+-----------+----------+-------------------+ POP                     Yes      Yes                  could not tolerate                                                        compression         +---------+---------------+---------+-----------+----------+-------------------+ PTV                     Yes                           could not tolerate                                                        compression         +---------+---------------+---------+-----------+----------+-------------------+ PERO                    Yes                           could not tolerate                                                        compression         +---------+---------------+---------+-----------+----------+-------------------+ GSV      Full                                                             +---------+---------------+---------+-----------+----------+-------------------+  Summary: RIGHT: - There is no evidence of deep vein thrombosis in the lower extremity. However, portions of this examination were limited- see technologist comments above.  - No cystic structure found in the popliteal fossa.  LEFT: - There is no evidence of deep vein thrombosis in the lower extremity. However, portions of this examination were limited- see technologist comments above.  - No cystic structure found in the popliteal fossa. - Patient could not tolerate compression and almost did not tolerate any touch. best images possible.  *See table(s) above for measurements and observations. Electronically signed by Deitra Mayo MD on 03/02/2020 at 6:04:43 PM.    Final    IR THORACENTESIS ASP PLEURAL SPACE W/IMG GUIDE  Result Date: 03/02/2020 INDICATION: SHORTNESS OF BREATH, RIGHT PLEURAL  EFFUSION EXAM: ULTRASOUND GUIDED RIGHT THORACENTESIS MEDICATIONS: 1% lidocaine local COMPLICATIONS: None immediate. PROCEDURE: An ultrasound guided thoracentesis was thoroughly discussed with the patient and questions answered. The benefits, risks, alternatives and complications were also discussed. The patient understands and wishes to proceed with the procedure. Written consent was obtained. Ultrasound was performed to localize and mark an adequate pocket of fluid in the right chest. The area was then prepped and draped in the normal sterile fashion. 1% Lidocaine was used for local anesthesia. Under ultrasound guidance a 6 Fr Safe-T-Centesis catheter was introduced. Thoracentesis was performed. The catheter was removed and a dressing applied. FINDINGS: A total of approximately 300 cc of blood tinged pleural fluid was removed. Samples were sent to the laboratory as requested by the clinical team. IMPRESSION: Successful ultrasound guided right thoracentesis yielding 300 cc of pleural fluid. Electronically Signed   By: Jerilynn Mages.  Shick M.D.   On: 03/02/2020 15:02    ASSESSMENT:  Metastatic breast cancer (HCC)with progression on taxol   Malignant pleural effusion   Hyperlipidemia associated with type 2 diabetes mellitus ([place])   Hypertension associated with diabetes ([place])   Shortness of breath   Asthma exacerbation   FFT   Dyspnea   Advanced care planning/counseling discussion  Discussion: 79 year old female with a history of breast cancer, hypertension, chronic kidney disease, hyperlipidemia, diastolic heart failure.  Her breast cancer is metastatic in nature with bilateral malignant effusions but has had multiple thoracentesis in the past.  She is followed by Dr. Vaughan Browner of pulmonary and Dr.Feng of oncology.  There have been discussions in the past with Dr. Vaughan Browner concerning the placement of bilateral Pleurx chest tube for palliation of dyspnea from recurrent bilateral pleural effusion.  She is readmitted to  [facility] 03/02/2020 with diagnosis of dyspnea recurrent pleural effusions.  She has had bilateral thoracentesis right and left last on 6 1 20,021 with 300 cc from right, andthoracentesis the left was 320 cc 02/24/2020. Pulmonary critical care was asked to evaluate on 03/04/2020 due to reoccurring pleural effusions and the possibilities of placement of Pleurx catheters bilaterally as a palliative measure for comfort.  Patient is aware of the palliation nature of the chest tube.  And wishes to proceed with Pleurx catheter placement.  Unfortunately pulmonary critical care does not have the manpower to place chest tubes at this time.  Therefore we would recommend interventional radiology to put place bilateral Pleurx catheters.     PLAN:  Bilateral Pleurx chest tube placement per interventional radiology for progressive breast cancer with metastases to the lung.  Dyspnea in the setting of metastatic lung cancer hopefully bilateral chest tube insertions will help with palliation of this concern.  Breast cancer per oncology  Pulmonary critical care  available as needed.   Richardson Landry Kynslee Baham ACNP Acute Care Nurse Practitioner [place] Please consult Amion 03/04/2020, 9:48 AM

## 2020-03-04 NOTE — Progress Notes (Addendum)
Daily Progress Note   Patient Name: Wendy [name]       Date: 03/04/2020 DOB: 1940-10-10  Age: 79 y.o. MRN#: [id number] Attending Physician: Wendy Pert, MD Primary Care Physician: Wendy Olp, MD Admit Date: 03/01/2020  Reason for Consultation/Follow-up: Establishing goals of care  Subjective: Noted Dr. Ernestina [name] note yesterday- concerns regarding continuing chemotherapy. Evaluated patient- she was sleeping comfortably. Did not arouse to my voice. I called her son - left message. Received call from Wendy [name] inquired about pleurx cath placement- when is this being done and by whom? I reviewed pulmonology's note with her that they recommended placement by IR due to pulmonology not having adequate staffing.  Wendy [name] also discussed that Dr. Burr [name] had called family and wanted to meet with them- hopefull at patient's scheduled appointment on Monday. I reviewed with Wendy [name] the concerns regarding [name]'s ability to tolerate any further treatments and that Hospice may be needed.  Wendy [name] notes that [name] has been pretty clear that she wishes to continue treatments. Wendy [name] agrees that follow-up discussion with patient and son and Dr. Burr [name] will be helpful.   Addendum- noted Dr. Lupita [name] note that due to patient's thoracentesis with low yield- pleurx cath not necessary at this time Review of Systems  Unable to perform ROS: Acuity of condition    Length of Stay: 1  Current Medications: Scheduled Meds:  . amLODipine  10 mg Oral Daily  . budesonide (PULMICORT) nebulizer solution  0.25 mg Nebulization BID  . busPIRone  10 mg Oral TID  . carvedilol  25 mg Oral BID WC  . cloNIDine  0.3 mg Oral BID  . famotidine  20 mg Oral Daily  . insulin aspart  0-15 Units Subcutaneous TID WC  . insulin  aspart  6 Units Subcutaneous TID WC  . insulin glargine  35 Units Subcutaneous Daily  . ipratropium-albuterol  3 mL Nebulization BID  . methylPREDNISolone sodium succinate  40 mg Intravenous Q12H  . mirtazapine  7.5 mg Oral QHS  . montelukast  10 mg Oral QHS  . pravastatin  40 mg Oral q1800  . sodium chloride flush  3 mL Intravenous Once    Continuous Infusions: . sodium chloride 125 mL/hr at 03/04/20 1158    PRN Meds: acetaminophen **OR** acetaminophen, albuterol, lidocaine (PF), morphine, ondansetron, polyethylene glycol  Physical Exam Vitals and nursing note reviewed.  Constitutional:      Appearance: She is well-developed.     Comments: sleeping  Cardiovascular:     Rate and Rhythm: Normal rate.     Pulses: Normal pulses.  Pulmonary:     Effort: Pulmonary effort is normal.             Vital Signs: BP 120/74 (BP Location: Right Leg)   Pulse 76   Temp 97.9 F (36.6 C)   Resp 18   Ht 5\' 2"  (1.575 m)   Wt 81.2 kg   LMP  (LMP Unknown)   SpO2 100%   BMI 32.74 kg/m  SpO2: SpO2: 100 % O2 Device: O2 Device: Room Air O2 Flow Rate: O2 Flow Rate (L/min): 2 L/min  Intake/output summary:   Intake/Output Summary (Last 24 hours) at 03/04/2020 1302 Last data filed at 03/04/2020 1222 Gross per 24 hour  Intake 1315.03 ml  Output 1750 ml  Net -434.97 ml   LBM: Last BM Date: 03/03/20 Baseline Weight: Weight: 89.4 kg Most recent weight: Weight: 81.2 kg       Palliative Assessment/Data: PPS: 40%      Patient Active Problem List   Diagnosis Date Noted  . Asthma exacerbation 03/02/2020  . 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 Metastatic breast cancer ([place]) 03/02/2020  . Dyspnea   . Advanced care planning/counseling discussion   . Moderate persistent asthma 01/31/2020  . Allergic rhinitis 01/31/2020  . GERD (gastroesophageal reflux disease) 01/31/2020  . Goals of care, counseling/discussion 01/08/2020  . Lymphedema of left arm 09/03/2019  . Major depression in  full remission ([place]) 12/05/2018  . Grief 05/10/2018  . Anemia 01/09/2018  . Fatigue 03/14/2017  . Edema 03/14/2017  . Shortness of breath 03/14/2017  . Port catheter in place 01/18/2017  . Rectal bleeding 01/17/2017  . Constipation 01/17/2017  . Malignant neoplasm of upper-outer quadrant of left breast in female, estrogen receptor positive ([place]) 11/20/2016  . CKD (chronic kidney disease) stage 3, GFR 30-59 ml/min ([facility]) 03/17/2016  . 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 Seasonal allergies 12/01/2013  . Diastolic dysfunction 69/45/0388  . TOBACCO USE 04/13/2008  . Hyperlipidemia associated with type 2 diabetes mellitus ([place]) 11/29/2006  . OBESITY, BMI 30-35 11/29/2006  . Hypertension associated with diabetes ([place]) 11/29/2006    Palliative Care Assessment & Plan   Patient Profile:  79 y.o. 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 Workup reveals pleural effusions and CT scans are concerning for progression of cancer. Palliative medicine consulted for discussion of goals of care given current findings.    Assessment/Recommendations/Plan   Hopeful for pleurx cath to be placed soon  Plan for d/c home with Palliative and continued goals of care  Recommend to utilize prn morphine as previously discussed for SOB/anxiety  Goals of Care and Additional Recommendations:  Limitations on Scope of Treatment: Full Scope Treatment  Code Status:  DNR  Prognosis:   Unable to  determine  Discharge Planning:  To Be Determined  Care plan was discussed with Wendy [name], patient's "  future daughter in law" and caregiver  Thank you for allowing the Palliative Medicine Team to assist in the care of this patient.   Time In: 1225 Time Out: 1300 Total Time 35 mins Prolonged Time Billed no      Greater than 50%  of this time was spent counseling and coordinating care related to the above assessment and plan.  Mariana Kaufman, AGNP-C Palliative Medicine   Please contact Palliative Medicine Team phone at [phone] for questions and concerns.

## 2020-03-05 ENCOUNTER — Other Ambulatory Visit: Payer: Self-pay | Admitting: Radiation Therapy

## 2020-03-05 LAB — BASIC METABOLIC PANEL
Anion gap: 9 (ref 5–15)
Anion gap: 7 (ref 5–15)
BUN: 56 mg/dL — ABNORMAL HIGH (ref 8–23)
BUN: 54 mg/dL — ABNORMAL HIGH (ref 8–23)
CO2: 20 mmol/L — ABNORMAL LOW (ref 22–32)
CO2: 19 mmol/L — ABNORMAL LOW (ref 22–32)
Calcium: 8.1 mg/dL — ABNORMAL LOW (ref 8.9–10.3)
Calcium: 7.9 mg/dL — ABNORMAL LOW (ref 8.9–10.3)
Chloride: 109 mmol/L (ref 98–111)
Chloride: 111 mmol/L (ref 98–111)
Creatinine, Ser: 2.25 mg/dL — ABNORMAL HIGH (ref 0.44–1.00)
Creatinine, Ser: 1.96 mg/dL — ABNORMAL HIGH (ref 0.44–1.00)
GFR calc Af Amer: 23 mL/min — ABNORMAL LOW (ref >60)
GFR calc Af Amer: 28 mL/min — ABNORMAL LOW (ref >60)
GFR calc non Af Amer: 20 mL/min — ABNORMAL LOW (ref >60)
GFR calc non Af Amer: 24 mL/min — ABNORMAL LOW (ref >60)
Glucose, Bld: 299 mg/dL — ABNORMAL HIGH (ref 70–99)
Glucose, Bld: 301 mg/dL — ABNORMAL HIGH (ref 70–99)
Potassium: 5.1 mmol/L (ref 3.5–5.1)
Potassium: 5 mmol/L (ref 3.5–5.1)
Sodium: 138 mmol/L (ref 135–145)
Sodium: 137 mmol/L (ref 135–145)

## 2020-03-05 LAB — GLUCOSE, CAPILLARY
Glucose-Capillary: 314 mg/dL — ABNORMAL HIGH (ref 70–99)
Glucose-Capillary: 235 mg/dL — ABNORMAL HIGH (ref 70–99)
Glucose-Capillary: 218 mg/dL — ABNORMAL HIGH (ref 70–99)

## 2020-03-05 LAB — BODY FLUID CULTURE: Culture: NO GROWTH

## 2020-03-05 MED ORDER — PREDNISONE 10 MG PO TABS
10.0000 mg | ORAL_TABLET | Freq: Every day | ORAL | Status: DC
  Administered 2020-03-05: 10 mg via ORAL
  Filled 2020-03-05: qty 1

## 2020-03-05 NOTE — Progress Notes (Signed)
Physical Therapy Treatment Patient Details Name: Wendy [name] MRN: [id number] DOB: Jan 25, 1941 Today's Date: 03/05/2020    History of Present Illness 79 yo female with onset of progressive DOE was admitted, noted wheezing and dry cough as well.  Had thoracentesis on 5/25, now referred to PT for mobility and LUE management.  New areas of mets were found with imaging.  PMHx:  Primary L breast CA, chemotherapy, CKD, HTN, GERD, DM, bony mets in chest, adenocarcinoma, asthma, anemia, anxiety, depression, liver mets, pulm mets    PT Comments    Pt was seen for mobility attempt but was too tired from getting up in the chair prior to PT arrival.  Pt is weak but has improvement on edema on LUE.  Will continue on with LE strengthening to assist with recovery of independence of gait, and to ease transition home with famliy.  Follow acutely for these needs.   Follow Up Recommendations  Home health PT;Supervision for mobility/OOB;Supervision/Assistance - 24 hour     Equipment Recommendations  Rolling walker with 5" wheels    Recommendations for Other Services       Precautions / Restrictions Precautions Precautions: Fall Precaution Comments: ck sats with standing Restrictions Weight Bearing Restrictions: No    Mobility  Bed Mobility               General bed mobility comments: up in chair when PT arrived  Transfers Overall transfer level: Needs assistance               General transfer comment: pt was up in chair when PT arrived and tired  Ambulation/Gait                 Stairs             Wheelchair Mobility    Modified Rankin (Stroke Patients Only)       Balance Overall balance assessment: Needs assistance Sitting-balance support: Feet supported Sitting balance-Leahy Scale: Good                                      Cognition Arousal/Alertness: Awake/alert Behavior During Therapy: Flat affect Overall Cognitive Status: Within  Functional Limits for tasks assessed                                        Exercises General Exercises - Lower Extremity Ankle Circles/Pumps: AAROM;5 reps Quad Sets: AROM;10 reps Gluteal Sets: AROM;10 reps Heel Slides: AAROM;10 reps Hip ABduction/ADduction: AAROM;10 reps    General Comments General comments (skin integrity, edema, etc.): Pt is up in chair with no SOB      Pertinent Vitals/Pain Pain Assessment: No/denies pain    Home Living                      Prior Function            PT Goals (current goals can now be found in the care plan section) Acute Rehab PT Goals Patient Stated Goal: get home and feel better    Frequency    Min 3X/week      PT Plan Current plan remains appropriate    Co-evaluation              AM-PAC PT "6 Clicks" Mobility   Outcome Measure  Help needed  turning from your back to your side while in a flat bed without using bedrails?: A Little Help needed moving from lying on your back to sitting on the side of a flat bed without using bedrails?: A Little Help needed moving to and from a bed to a chair (including a wheelchair)?: A Little Help needed standing up from a chair using your arms (e.g., wheelchair or bedside chair)?: A Little Help needed to walk in hospital room?: A Lot Help needed climbing 3-5 steps with a railing? : A Lot 6 Click Score: 16    End of Session Equipment Utilized During Treatment: Oxygen Activity Tolerance: Patient limited by fatigue;Treatment limited secondary to medical complications (Comment) Patient left: in chair;with call bell/phone within reach;with chair alarm set Nurse Communication: Mobility status PT Visit Diagnosis: Unsteadiness on feet (R26.81);Muscle weakness (generalized) (M62.81);Difficulty in walking, not elsewhere classified (R26.2)     Time: 6073-7106 PT Time Calculation (min) (ACUTE ONLY): 24 min  Charges:  $Therapeutic Exercise: 23-37 mins                    Ramond Dial 03/05/2020, 11:19 PM  Mee Hives, PT MS Acute Rehab Dept. Number: [place] and [place]

## 2020-03-05 NOTE — Progress Notes (Signed)
[name] to be D/C'd  per MD order. Discussed with the patient and all questions fully answered.  VSS, Skin clean, dry and intact without evidence of skin break down, no evidence of skin tears noted.  IV catheter discontinued intact. Site without signs and symptoms of complications. Dressing and pressure applied.  An After Visit Summary was printed and given to the patient.  D/c education completed with patient/family including follow up instructions, medication list, d/c activities limitations if indicated, with other d/c instructions as indicated by MD - patient able to verbalize understanding, all questions fully answered.   Patient instructed to return to ED, call 911, or call MD for any changes in condition.   Patient to be escorted via [place], and D/C home via private auto.

## 2020-03-05 NOTE — Progress Notes (Signed)
**Note Wendy-Identified via Obfuscation** Wendy [name]   [phone] [phone]   Clinic Follow up Note   Patient Care Team: Marin Olp, MD as PCP - General (Family Medicine) Philemon Kingdom, MD as Consulting Physician (Internal Medicine) Jalene Mullet, MD as Consulting Physician (Ophthalmology) Erroll Luna, MD as Consulting Physician (General Surgery) Kyung Rudd, MD as Consulting Physician (Radiation Oncology) [name], MD as Consulting Physician (Hematology) Delice Bison, Charlestine Massed, NP as Nurse Practitioner (Hematology and Oncology) Marshell Garfinkel, MD as Consulting Physician (Pulmonary Disease)  Date of Service:  03/08/2020  CHIEF COMPLAINT: Follow Up for Invasive Ductal Carcinoma of the left breast  SUMMARY OF ONCOLOGIC HISTORY: Oncology History Overview Note  Cancer Staging Malignant neoplasm of upper-outer quadrant of left breast in female, estrogen receptor positive ([place]) Staging form: Breast, AJCC 8th Edition - Clinical: Stage IIB (cT2, cN0, cM0, G3, ER: Positive, PR: Negative, HER2: Negative) - Signed by [name], MD on 11/23/2016 - Pathologic stage from 12/28/2016: Stage IIB (pT2, pN1a(sn), cM0, G3, ER: Positive, PR: Negative, HER2: Negative) - Signed by [name], MD on 01/02/2017     Malignant neoplasm of upper-outer quadrant of left breast in female, estrogen receptor positive ([place])  11/01/2016 Mammogram   Diagnostic mammogram and ultrasound of left breast and axilla showed a 3.1 x 2.1 x 1.4 cm (3.3 x 2.0 x 2.7 cm by ultrasound) mass in the upper outer quadrant of the anterior third of the left breast, associated with pleomorphic calcification. There is a 8 mm (1.8cm by [name]) prominent lymph node in the left axilla.   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   11/07/2016 Receptors her2   ER 80% positive, PR negative, HER-2 negative, Ki-67 90%   11/20/2016 Initial  Diagnosis   Malignant neoplasm of upper-outer quadrant of left breast in female, estrogen receptor positive ([place])   12/07/2016 Surgery   Left lumpectomy and left axillary sentinel lymph node sampling by Dr. Brantley Stage   12/07/2016 Pathology Results   pT2, pN1 Left Lumpectomy: Grade 3 IDC measuring 3.4 cm, carcinoma broadly present at the superior margin. Grade 3 DCIS. 1 out of 2 left axillary SLN positive for metastatic carcinoma   12/07/2016 Miscellaneous   Mammaprint showed high risk disease, basal type    12/28/2016 Surgery   Re-excision of the previously positive superior margin was negative for malignant cells.    01/04/2017 Surgery   Port inserted   01/18/2017 - 03/22/2017 Chemotherapy   Adjuvant Docetaxel 75 mg/m and Cytoxan 600 mg/m, every 21 days, for total of 4 cycles, with Neulasta on day 2.    04/23/2017 - 05/18/2017 Radiation Therapy   Radiation treatment dates:   04/23/17 - 05/18/17 Administered by Dr. Lisbeth Renshaw  Site/dose:    Left breast/ 42.5 Gy in 17 Fx Boost / 7.5 Gy in 3 Fx   06/12/2017 - 08/14/2019 Anti-estrogen oral therapy   Adjuvant letrozole 1 mg daily, plan for 5-7 years. D/c on 08/14/19 due to local recurrence of left axilla.     07/20/2017 Mammogram   IMPRESSION: No mammographic evidence of malignancy in either breast. 3.8 cm left breast postsurgical loculated seroma.   09/12/2017 Survivorship     09/19/2018 Imaging   Baseline DEXA 09/19/18 ASSESSMENT: The BMD measured at Femur Neck Left is 0.888 g/cm2 with a T-score of -1.1. This patient is considered osteopenic according to [facility]) criteria.   The scan quality is good. L-4  was excluded due to degenerative changes.   Site Region Measured Date Measured Age YA BMD Significant CHANGE T-score AP Spine  L1-L3      09/19/2018    77.2         -0.7    1.084 g/cm2   DualFemur Neck Left  09/19/2018    77.2         -1.1    0.888 g/cm2   DualFemur Total Mean 09/19/2018    77.2         -0.5     0.946 g/cm2 ASSESSMENT: The probability of a major osteoporotic fracture is 4.5 % within the next ten years.   The probability of hip fracture is 0.8  % within the next 10 years.   06/25/2019 Mammogram   Diagnostic mammogram 06/25/19  IMPRESSION: 1.  Two morphologically abnormal lymph nodes in the left axilla with a lymph node in the lower left axilla measuring 2.4 x 2 x 2.6 cm.   2. Stable lumpectomy changes left breast with no findings of malignancy in either breast.   07/01/2019 Pathology Results   Diagnosis Lymph node, needle/core biopsy, left axilla - POORLY DIFFERENTIATED CARCINOMA CONSISTENT WITH BREAST PRIMARY. - NO LYMPHOID NODAL TISSUE IDENTIFIED. - SEE MICROSCOPIC DESCRIPTION. Microscopic Comment The carcinoma is consistent with grade III.  PROGNOSTIC INDICATORS Results: IMMUNOHISTOCHEMICAL AND MORPHOMETRIC ANALYSIS PERFORMED MANUALLY The tumor cells are NEGATIVE for Her2 (1+). Estrogen Receptor: 80%, POSITIVE, WEAK STAINING INTENSITY Progesterone Receptor: 0%, NEGATIVE Proliferation Marker Ki67: 80%   07/18/2019 Breast MRI   Breast MRI 07/18/19  IMPRESSION: 1. Two morphologically abnormal level 1 left axillary lymph nodes correlating with biopsy-proven malignancy. 2. No MRI evidence of malignancy in either breast. 3. Postsurgical changes of the left breast consistent with prior lumpectomy.   07/25/2019 PET scan   PET 07/25/19  IMPRESSION: 1. Hypermetabolic LEFT axillary mass consistent with metastatic breast cancer. 2. High LEFT axillary/posterior triangle (level 5) lymph node consistent with local metastasis. 3. No central mediastinal nodal metastasis. No suspicious pulmonary nodules. 4. No distant soft tissue metastasis or skeletal metastasis. 5. Calcified fibroid uterus   08/05/2019 Surgery   LEFT AXILLARY LYMPH NODE DISSECTION by Dr. Brantley Stage 08/05/19    08/05/2019 Pathology Results   FINAL MICROSCOPIC DIAGNOSIS: 08/05/19    A. LYMPH NODES, LEFT  AXILLARY, DISSECTION:  - Metastatic carcinoma.  - See comment.    08/2019 - 01/13/2020 Anti-estrogen oral therapy   Exemestane 110m starting 08/2019. Stopped with the start of chemo in 01/2020   09/01/2019 Genetic Testing   [employer] Genomic Findings:  -RET amplification -CDK8 amplification -NOTCH1 deletion  -TGY17splice site [id number]>H  16/75/9163Imaging   CT Angio Chest   IMPRESSION: 1. No definitive pulmonary embolism. 2. Multiple new small bilateral pulmonary nodules consistent with metastatic disease. 3. Soft tissue density in the left axilla at the site of node dissection, probably representing scarring but I cannot exclude tumor recurrence. 4. New partially healed fracture of the posterior aspect of the right eighth rib.   12/04/2019 - 01/13/2020 Chemotherapy   Verzenio 15110mBID starting 12/04/19. Stopped a week before the start of chemo in 01/2020   01/07/2020 PET scan   IMPRESSION: 1. Marked worsening of disease with soft tissue recurrence in the left axilla, new and enlarging lymph nodes in the supraclavicular/high axillary region, new mediastinal adenopathy and new pulmonary pleural and parenchymal disease. 2. Metastatic disease to the right hepatic lobe. 3. Signs of bony metastasis. 4. New pleural effusions presumably malignant and  associated with pleural nodularity.   01/20/2020 - 02/23/2020 Chemotherapy   Abraxane on days 1, 8, 15 q28 days; started 01/20/20. Stopped after 02/23/20 due to disease progression.    02/24/2020 Procedure   Left thoracentesis  IMPRESSION: Successful ultrasound guided left thoracentesis yielding 320 ml of pleural fluid.    FINAL MICROSCOPIC DIAGNOSIS:  - Malignant cells consistent with metastatic adenocarcinoma     03/02/2020 Imaging   CT Angio Chest  IMPRESSION: No evidence of pulmonary emboli.   Progression of disease particularly within the lungs, hila and mediastinum consistent with the known history of breast carcinoma with  recurrence. Persistent left breast mass is noted with increasing left axillary and left chest wall lymphadenopathy.   Bilateral pleural effusions right greater than left.   Bony metastatic disease.   03/02/2020 Imaging   CT AP  IMPRESSION: 1. No acute abdominal/pelvic findings or lymphadenopathy. No findings for small bowel obstruction. 2. 3 cm segment 6 liver lesion consistent with metastatic disease. 3. Enlarged fibroid uterus with calcified fibroids. 4. Bilateral pleural effusions and numerous small metastatic pulmonary nodules.   Aortic Atherosclerosis (ICD10-I70.0).   03/02/2020 Procedure   Right Thoracentesis  IMPRESSION: Successful ultrasound guided right thoracentesis yielding 300 cc of pleural fluid.      FINAL MICROSCOPIC DIAGNOSIS:  - Malignant cells consistent with adenocarcinoma  - See comment      CURRENT THERAPY:  Supportive Care  INTERVAL HISTORY:  Wendy [name] is here for a follow up. She was hospitalized on 03/01/20 for dyspnea and pleural effusion. She was found to have b/l pleural effusion. She had b/l thoracentesis and cytologies showed cancer metastasis. She was discharged on 03/05/20. She presents to the clinic with her son and [name]. Two of her other children were called to be included in the visit. She notes since hospital discharge she is doing better. She feels soreness and stiffness of her legs since being in hospital bed. Her BG and BP has been better and she is able to walk more. She ambulates independently mostly. She tries to do things on her own at home. She can get dressed, used the bathroom and clean her room. She up at home 50% of the day. She is breathing better. Since hospital discharge she has been on continuous canula oxygen 4L/min during the day and 5L.min at night. She would have 97-98 Pulse Ox on 5L/min. She notes she dips snuff tabacco but not smoke.     REVIEW OF SYSTEMS:   Constitutional: Denies fevers, chills or abnormal weight  loss Eyes: Denies blurriness of vision Ears, nose, mouth, throat, and face: Denies mucositis or sore throat Respiratory: Denies cough, dyspnea or wheezes (+) Continuous oxygen canula  Cardiovascular: Denies palpitation, chest discomfort or lower extremity swelling Gastrointestinal:  Denies nausea, heartburn or change in bowel habits Skin: Denies abnormal skin rashes Lymphatics: Denies new lymphadenopathy or easy bruising Neurological:Denies numbness, tingling or new weaknesses Behavioral/Psych: Mood is stable, no new changes  All other systems were reviewed with the patient and are negative.  MEDICAL HISTORY:  Past Medical History:  Diagnosis Date  . Abnormal breast finding 2018   per pt/ having  a lot drainage from left breast nipple  . Anemia   . Anxiety   . Breast cancer ([place]) 11/2016   left/  . CKD (chronic kidney disease)   . Depression   . GERD (gastroesophageal reflux disease)    TUMS as needed  . HTN (hypertension)    states BP has been high recently;  has been on med. x 20 yr.  . Hyperlipidemia   . Hyperplastic colon polyp   . HYPERTENSION, BENIGN SYSTEMIC 11/29/2006   Lisinopril hctz 10-12.'5mg'$ , amlodipine '10mg'$ , coreg '25mg'$  BID, clonidine 0.3 mg BID.   Marland Kitchen Insulin dependent diabetes mellitus   . Personal history of chemotherapy    2018/finished 6 weeks of chemo in Sep 2018  . Personal history of radiation therapy    2018 left breast/finished radiation in Sept 2018 per pt.    SURGICAL HISTORY: Past Surgical History:  Procedure Laterality Date  . AXILLARY LYMPH NODE DISSECTION Left 08/05/2019   Procedure: LEFT AXILLARY LYMPH NODE DISSECTION;  Surgeon: Erroll Luna, MD;  Location: [facility];  Service: General;  Laterality: Left;  . BREAST LUMPECTOMY Left 05/05/2008  . BREAST LUMPECTOMY Left 12/07/2016   malignant  . BREAST LUMPECTOMY WITH RADIOACTIVE SEED AND SENTINEL LYMPH NODE BIOPSY Left 12/07/2016   Procedure: LEFT BREAST LUMPECTOMY WITH RADIOACTIVE  SEED AND SENTINEL LYMPH NODE BIOPSY;  Surgeon: Erroll Luna, MD;  Location: [place];  Service: General;  Laterality: Left;  . IR FLUORO GUIDE PORT INSERTION RIGHT  01/04/2017  . IR THORACENTESIS ASP PLEURAL SPACE W/IMG GUIDE  03/02/2020  . IR US GUIDE VASC ACCESS RIGHT  01/04/2017  . PORT-A-CATH REMOVAL N/A 05/30/2017   Procedure: REMOVAL PORT-A-CATH;  Surgeon: Erroll Luna, MD;  Location: [place];  Service: General;  Laterality: N/A;  . RE-EXCISION OF BREAST LUMPECTOMY Left 12/28/2016   Procedure: RE-EXCISION OF BREAST LUMPECTOMY;  Surgeon: Erroll Luna, MD;  Location: [place];  Service: General;  Laterality: Left;    I have reviewed the social history and family history with the patient and they are unchanged from previous note.  ALLERGIES:  has No Known Allergies.  MEDICATIONS:  Current Outpatient Medications  Medication Sig Dispense Refill  . albuterol (VENTOLIN HFA) 108 (90 Base) MCG/ACT inhaler Inhale 2 puffs into the lungs every 6 (six) hours as needed for wheezing or shortness of breath. 18 g 2  . amLODipine (NORVASC) 10 MG tablet TAKE 1 TABLET BY MOUTH EVERY DAY (Patient taking differently: Take 10 mg by mouth daily. ) 90 tablet 1  . B-D INS SYRINGE 0.5CC/31GX5/16 31G X 5/16" 0.5 ML MISC USE AS DIRECTED 4 TIMES A DAY 300 each 1  . busPIRone (BUSPAR) 10 MG tablet Take 1 tablet (10 mg total) by mouth 3 (three) times daily. 270 tablet 1  . carvedilol (COREG) 25 MG tablet Take 1 tablet (25 mg total) by mouth 2 (two) times daily with a meal. 180 tablet 3  . cloNIDine (CATAPRES) 0.3 MG tablet TAKE 1 TABLET (0.3 MG TOTAL) BY MOUTH 2 (TWO) TIMES DAILY. 180 tablet 1  . famotidine (PEPCID) 20 MG tablet TAKE 1 TABLET BY MOUTH EVERY DAY (Patient taking differently: Take 20 mg by mouth daily. ) 30 tablet 1  . fluticasone (FLONASE) 50 MCG/ACT nasal spray Place 1 spray into both nostrils daily. 16 g 2  . fluticasone furoate-vilanterol (BREO ELLIPTA) 100-25 MCG/INH  AEPB Inhale 1 puff into the lungs daily. 14 each 0  . HYDROcodone-acetaminophen (NORCO/VICODIN) 5-325 MG tablet Take 1 tablet by mouth every 8 (eight) hours as needed for moderate pain. 15 tablet 0  . Insulin Glargine (LANTUS SOLOSTAR) 100 UNIT/ML Solostar Pen Inject 28 units into the skin in am (Patient taking differently: Inject 28 Units into the skin daily. ) 10 pen 3  . Insulin Pen Needle 31G X 5 MM MISC Use pen needles for  insulin injection daily 100 each 3  . insulin regular (NOVOLIN R) 100 units/mL injection Inject 0.14-0.18 mLs (14-18 Units total) into the skin 3 (three) times daily before meals. Relion. Please provide insulin syringes needed (Patient taking differently: Inject 10-16 Units into the skin See admin instructions. 16 units in the morning and 10 units at night) 20 mL 11  . Insulin Syringe-Needle U-100 (B-D INS SYRINGE 0.5CC/30GX1/2") 30G X 1/2" 0.5 ML MISC USE AS DIRECTED 4 TIMES A DAY 300 each 3  . lidocaine-prilocaine (EMLA) cream Apply to affected area once (Patient not taking: Reported on 02/17/2020) 30 g 3  . lovastatin (MEVACOR) 40 MG tablet TAKE 1 TABLET BY MOUTH EVERYDAY AT BEDTIME (Patient taking differently: Take 40 mg by mouth at bedtime. ) 90 tablet 3  . mirtazapine (REMERON) 7.5 MG tablet Take 1 tablet (7.5 mg total) by mouth at bedtime. 30 tablet 0  . montelukast (SINGULAIR) 10 MG tablet Take 1 tablet (10 mg total) by mouth at bedtime. 30 tablet 3  . ondansetron (ZOFRAN) 8 MG tablet Take 1 tablet (8 mg total) by mouth 2 (two) times daily as needed (Nausea or vomiting). 30 tablet 1  . polyethylene glycol powder (GLYCOLAX/MIRALAX) 17 GM/SCOOP powder Take 17 g by mouth daily. (Patient taking differently: Take 17 g by mouth as needed for mild constipation or moderate constipation. ) 1 g 0  . traMADol (ULTRAM) 50 MG tablet TAKE 1 TABLET BY MOUTH EVERY 6 HOURS AS NEEDED FOR MODERATE/SEVERE PAIN (NECK AND SHOULDER PAIN). 30 tablet 0   No current facility-administered  medications for this visit.    PHYSICAL EXAMINATION: ECOG PERFORMANCE STATUS: 3 - Symptomatic, >50% confined to bed  Vitals:   03/08/20 0905  BP: (!) 112/97  Pulse: 69  Resp: 17  Temp: 97.7 F (36.5 C)  SpO2: 100%   Filed Weights   03/08/20 0905  Weight: 209 lb 4.8 oz (94.9 kg)    Due to COVID19 we will limit examination to appearance. Patient had no complaints.  GENERAL:alert, no distress and comfortable SKIN: skin color normal, no rashes or significant lesions EYES: normal, Conjunctiva are pink and non-injected, sclera clear  NEURO: alert & oriented x 3 with fluent speech  (+) On Continuous oxygen canula   LABORATORY DATA:  I have reviewed the data as listed CBC Latest Ref Rng & Units 03/08/2020 03/01/2020 02/23/2020  WBC 4.0 - 10.5 K/uL 9.2 7.3 6.1  Hemoglobin 12.0 - 15.0 g/dL 9.5(L) 12.6 10.7(L)  Hematocrit 36.0 - 46.0 % 27.7(L) 37.9 31.4(L)  Platelets 150 - 400 K/uL 190 371 272     CMP Latest Ref Rng & Units 03/08/2020 03/05/2020 03/05/2020  Glucose 70 - 99 mg/dL 254(H) 301(H) 299(H)  BUN 8 - 23 mg/dL 25(H) 54(H) 56(H)  Creatinine 0.44 - 1.00 mg/dL 1.29(H) 1.96(H) 2.25(H)  Sodium 135 - 145 mmol/L 140 137 138  Potassium 3.5 - 5.1 mmol/L 4.4 5.0 5.1  Chloride 98 - 111 mmol/L 110 111 109  CO2 22 - 32 mmol/L 22 19(L) 20(L)  Calcium 8.9 - 10.3 mg/dL 8.1(L) 7.9(L) 8.1(L)  Total Protein 6.5 - 8.1 g/dL 5.5(L) - -  Total Bilirubin 0.3 - 1.2 mg/dL 0.4 - -  Alkaline Phos 38 - 126 U/L 85 - -  AST 15 - 41 U/L 13(L) - -  ALT 0 - 44 U/L 10 - -      RADIOGRAPHIC STUDIES: I have personally reviewed the radiological images as listed and agreed with the findings in the report. No  results found.   ASSESSMENT & PLAN:  Wendy [name] is a 79 y.o. female with    1. Malignant neoplasm of upper-outer quadrant of left breast, Invasive Ductal Carcinoma, pT2pN1M0, stage IIB, ER+/PR-/HER2-, G3, mammaprint high risk, axillarynodesrecurrence in 06/2019, lung nodules in 10/2019, metastatic to  lung, bone, LNs, liver in 01/2020 -She was initiallydiagnosed in 11/2016. She was treated with left lumpectomy and SLN sampling,re-excision surgery, Adjuvant TC for 4 cycles andadjuvantradiation.  -She was on adjuvant Letrozole from 06/2017-08/14/19 -Unfortunately she had local recurrence in left axillaandunderwent Left axillary lymph node dissection by Dr. Brantley Stage on 08/05/19.She had complete resection. -I recommendedshe continue Exemestane and addedoral biological agent Verzenio '150mg'$  BID starting3/4/21. -Her PETimagesfrom 4/7/21showed wide spread disease with soft tissue recurrence in the left axilla, enlarging LNs in chest, new lung pleural and parenchymal disease, metastatic disease of the right liver lobe and signs of bone metastasis. There is also new pleural effusions presumably malignant and associated with pleural nodularity.Based on the scan findings,I do not think biopsy is necessary. At this stage her cancer is no longer curable but still treatable.  -Due to the rapid disease progression through AI and CDK4/6 inhibitor,and multiple visceral organ involvement, I started her on systemic chemo with single agent Abraxane'80mg'$ /m2 weekly,3 weeks on/1 week off on 01/20/20.  -Her FO results which showedRET, CDK8, NOTCH1, TP53 mutations.Notargetable mutation for now.  -Her 03/02/20 CT CAP unfortunately showed disease progression. Clinically she also developed worsening bilateral pleural effusion. She had b/l thoracentesis in the past 2 weeks and both cytologies were positive for Malignant cells consistent with adenocarcinoma. I'll check the ER, PR and HER-2 on her recent cytology, to see if her metastatic breast cancer has changed the biology. -I discussed with new onset malignant pleural effusion, this is evident of disease progression. Will d/c Abraxane. I discussed this indicates more aggressive disease which is not responding well to current chemo treatment.  -I discussed the option  of second line chemo such as gemcitabine with or without carbo, Xeloda, or [place] etc. If she does have triple negative disease now and PD-L1(+), will also add Tecentriq.  With poor response to first-line chemotherapy, we discussed her response to the future chemo treatment will likely be low also.  I discussed the risk of treatment may start to outweigh the benefit moving forward giving her aggressive cancer and decreased performance status.  -I discussed the option of Palliative care or stopping treatment to proceed with Hospice home care to manage her symptoms and quality of life. I discussed off treatment her cancer will progress and become more symptomatic. If not able to be cared for at home and she became more symptomatic from cancer, she may qualify for residential hospice. -She would like to think about her options further with her family. Her family are supportive of whatever discission she chooses.  -Labs reviewed, Hg 9.5, BG 254, BUN 25, Cr 1.29, Ca 8.1, protein 5.5, albumin 2.7.  -F/u open. She will call when she made a discission.    2. Dyspnea on exertion, Asthma, lung metastasis, bilateral malignant pleural effusion   -She has been having Dry Cough and SOB for the past few months and has progressed recently. -Patient denies smoking before but notes she has h/o of Tobacco snuff and h/o asthma.  -She has receivedboth herCOVID19 vaccinations. -She is being followed by PCP and pulmonologist Dr. Vaughan Browner. Her breathing approved on Breo given by her PCP. -Her recent VQ scan was negative for PE -In the past 2 weeks  she has had b/l thoracentesis. Both procedure had only about 300 cc fluids removed and both cytologies were positive for Malignant cells consistent with adenocarcinoma.  -She does not h/o of tabacco use with dip and snuff, but no smoking.  -Because her pleural effusion is related to her cancer this can become recurrent. Will consider pleurx placement, if she has frequent  Thoracentesis.  -Since hospital discharge she has been on continuous canula oxygen 4L/min during the day and 5L/min at night. She would have 97-98 Pulse Ox. I discussed her pulse Ox is fine at 92%, so she can reduce Oxygen to 2-4L/min.  -I encouraged her to watch her pulse ox, need for oxygen and watch for SOB as this may indicate increased pleural effusion.    3. Low appetite, nausea, B/l abdominal pain, Diarrhea/constipation -She notes she has nausea which is controlled with antiemetics -She has lowered appetite from her bloating, early satiety, along with occasional b/l abdominal pain and occasional diarrhea/constipation.  -She has metamucil and prune juice as needed which helps her bowels.  -I encouraged her to use her Glucerna to help her maintain weight. I started her on Mirtazapine 7.'5mg'$  for her to take nightly (01/08/20). If not enough we can titrate up or I can call in Marinol. I recommend she hold Trazodone for now given Mirtazapine can cause drowsiness. -For her pain I will call in Tramadol (03/08/20). If her pain progresses or not controlling her pain, I will call in Oxycodone.    4. Left arm numbness/tinglingandLeft arm lymphedema, secondary to surgery. -secondary to malignant left axillary adenopathy, s/p ALND in 08/2019 -She still has left armand handlymphedema. I discussed her edema can improve but may have residual edema permanently. -She is currently doing PT 3 times a week, continue.   5. HTN, DM, CKD III -F/u with PCPand endocrinologist.  -DM is not well controlled. I encouraged her to better control her DM as this can effects her kidney function.   6. Mild anemia -Iron and TIBC, B12, MMA normal -Anemia got worse since she started chemotherapy, and required blood transfusion  7. Osteopenia  -09/2018 DEXA shows osteopeniawith lowest T-score -1.1 -Will monitor onAI. Next DEXA in 09/2020  8. Financialand SocialSupport  -She gets help with her son  but he is looking to return to working. I discussed there is transportation help from [facility] if needed. She is open.  -Per request, I will see if she is eligible for home aid.   9. Goal of care discussion, DNR/DNI  -We reviewed that due to her rapid disease progression, her cancer is not curable at this point. The goal is palliative, to prolong her life and improve quality of life. She understands and agrees. -The patient understands the goal of care is palliative. -I recommend DNR/DNI, she agreed to DNR/DNI.  -Given her aggressive cancer and decreased performance status, she will think about hospice care.    Plan: -We discussed second line chemotherapy, versus palliative care and hospice today.  She will make a decision and call me back. -Further appointment based on her decision, will schedule when she calls back.   No problem-specific Assessment & Plan notes found for this encounter.   No orders of the defined types were placed in this encounter.  All questions were answered. The patient knows to call the clinic with any problems, questions or concerns. No barriers to learning was detected. The total time spent in the appointment was 45 minutes.     [name], MD 03/08/2020  Oneal Deputy, am acting as scribe for [name], MD.   I have reviewed the above documentation for accuracy and completeness, and I agree with the above.

## 2020-03-05 NOTE — TOC Progression Note (Addendum)
Transition of Care [facility]) - Progression Note    Patient Details  Name: ANADIA HELMES MRN: [id number] Date of Birth: Mar 06, 1941  Transition of [facility]) CM/SW Contact  Jacalyn Lefevre Edson Snowball, RN Phone Number: 03/05/2020, 12:57 PM  Clinical Narrative:     2924 Ordered home oxygen through [place]. Portable tank will come to room prior to discharge. [name] and patient aware. Ordered patient a walker from [place]. Per notes patient has an appointment with Dr Burr Medico on Monday , Dr Burr Medico plans to discuss treatment plan / hospice at that time  Spoke to [facility] and patient at bedside, they do want [facility], Aide and PT . Provided Medicare.gov list. [facility] [place] with [facility] accepted referral. Jackquline Denmark will call patient on Monday to arrange visit. [name] and patient aware.  Patient and [name] agreeable to Palliative Care are home. Choice offered. Choice is [facility] referral for PC given to Crystalyn with [facility].    Expected Discharge Plan: [place] Barriers to Discharge: Continued Medical Work up  Expected Discharge Plan and Services Expected Discharge Plan: [place]   Discharge Planning Services: CM Consult Post Acute Care Choice: Home Health, Durable Medical Equipment Living arrangements for the past 2 months: Single Family Home                 DME Arranged: Walker rolling DME Agency: [employer] Date DME Agency Contacted: 03/04/20 Time DME Agency Contacted: 73 Representative spoke with at DME Agency: [facility] Arranged: PT, RN           Social Determinants of Health ([place]) Interventions    Readmission Risk Interventions No flowsheet data found.

## 2020-03-05 NOTE — Progress Notes (Signed)
SATURATION QUALIFICATIONS: (This note is used to comply with regulatory documentation for home oxygen)  Patient Saturations on Room Air at Rest = 90%  Patient Saturations on Room Air while Ambulating = 71%  Patient Saturations on 4 Liters of oxygen while Ambulating = 96 %  Please briefly explain why patient needs home oxygen:  Patient is having difficulty breathing while ambulating. Patient has shortness of breath after ambulating for 5 minutes inside of the room. Patient express concern about walking too far and not being able to catch her breath. Pt request oxygen at home to ambulate and be able to tolerate ADLs.

## 2020-03-05 NOTE — Discharge Summary (Addendum)
Physician Discharge Summary  KERSTI SCAVONE HDQ:[id number] DOB: 1941/02/25 DOA: 03/01/2020  PCP: Marin Olp, MD  Admit date: 03/01/2020 Discharge date: 03/05/2020  Admitted From: home Disposition:  Home  Recommendations for Outpatient Follow-up:  1. Follow up with PCP in 1-2 weeks 2. Please obtain BMP/CBC in one week 3. Please follow up on the following pending results:  Home Health:HH  Equipment/Devices: RW w/ 5" wheels  Discharge Condition: Stable Code Status: DNR Diet recommendation:  Diet Order            Diet - low sodium heart healthy        Diet Carb Modified        Diet heart healthy/carb modified Room service appropriate? Yes; Fluid consistency: Thin  Diet effective now               Brief/Interim Summary: 79 y.o.femalewith medical history significant ofanemia, anxiety, depression, metastatic breast cancer currently on chemo, CKD, hypertension, hyperlipidemia, GERD, insulin-dependent diabetes presenting with complaints of shortness of breath. Patient reports 64-month history of progressively worsening dyspnea on exertion. She is also wheezing and having a dry cough. States her doctor gave her Breo and albuterol which are not helping.States she had fluid drained from her lungs recently but continues to feel short of breath.Denies fevers or chest pain.No other complaints.  Patient was seen by pulmonology on 5/26 and her dyspnea was felt to be related to asthma and pleural effusion from breast cancer recurrence. Patient underwent recent thoracentesis on 5/25 and 320 cc fluid was drained. Cytology showing malignant cells consistent with metastatic adenocarcinoma.  ED Course:Afebrile. Tachypneic with respiratory rate up to mid 20s but not hypoxic. Placed on 2 L supplemental oxygen for comfort. Labs showing no leukocytosis. Creatinine 1.5, at baseline. Initial high-sensitivity troponin 22, repeat stable 19. EKG without acute ischemic changes. SARS-CoV-2 PCR  test negative.  CT angiogram chest negative for PE. Showing progression of disease particularly within the lungs, hila, and mediastinum consistent with known history of breast carcinoma with recurrence. Persistent left breast mass is noted with increasing left axillary and left chest wall lymphadenopathy. Bilateral pleural effusions, moderate on the right and small on the left. Bony metastatic disease.  Patient was given albuterol inhaler and continuous neb treatments, Solu-Medrol 60 mg.TRH consulted for admission for further evaluation and treatment  Patient was admitted managed for pleural effusion Underwent thoracentesis 3 mm blood-tinged fluid removed. Patient had AKI on CKD treated with IV fluids and creatinine has subsequently downtrending. Overall shortness of breath improving -no more wheezing will stop the steroid given uncontrolled hyperglycemia secondary to steroids  Sent hyperkalemia and now resolved. Overall prognosis Poor/guarded due to metastatic breast cancer with liver mets pulmonary mets malignant pleural effusion.  Discussed Dr. Annamaria Boots was planning to meet with patient and family on Monday in the office to discuss about further plan/hospice care.  Seen by palliative care while here.  Discharge Diagnoses:  Active Problems:   Hyperlipidemia associated with type 2 diabetes mellitus ([place])   Hypertension associated with diabetes ([place])   Shortness of breath   Asthma exacerbation   Malignant pleural effusion   Metastatic breast cancer (HCC)   Dyspnea   Advanced care planning/counseling discussion   Palliative care by specialist  Shortness from acute asthma exacerbation bilateral pleural effusion, no more wheezing stop steroids continue home inhalers, continue bronchodilators follow-up with pulmonary.    Malignant pleural effusion bilateral: IR drained 300 mL of blood-tinged fluid on 6/1, not enough fluid for Pleurx catheter as per  Dr. Burr Medico who had discussed with IR.   Patient will follow Dr. Annamaria Boots and if needed she will arrange for Pleurx catheter by IR or pulmonary.  Patient was also seen by pulmonary.  Acute hypoxic resp failure from above- No PE.She was 90% on RA at rest but on ambulation dropped to 70%, placed on 4l [place] and spo2 came up in 96%$- spoke w [name] and Patient- they prefer to go hoem tonight, will set up home o2, also spoke with Dr Vaughan Browner her pulmonology and agrees with disposition with 4l [place]. Dr Grant Ruts informed and can monitor cxr as o/p.  Insulin-dependent diabetes mellitus with uncontrolled hyperglycemia.  Stable A1c.  Lantus was increased to 35 units and on Premeal NovoLog and sliding scale insulin due to uncontrolled hyperglycemia due to steroid.  Since wheezing improved stopping steroids were changed to p.o. today.  Continue to monitor blood sugar at home   AKI on CKD stage IV: Baseline BUN/creatinine 1.5-2.0.  Was around 1.5 slowly uptrended peaked to 2.2 now improved to 1.9 we will stop IV fluids hold losartan until seen by PCP.  Recent Labs  Lab 03/03/20 0435 03/03/20 1503 03/04/20 0143 03/05/20 0347 03/05/20 1330  BUN 31* 41* 49* 56* 54*  CREATININE 1.86* 2.06* 2.24* 2.25* 1.96*    Hyperkalemia-resolved. Lokelma x1.    Metastatic breast cancer with liver mets, pulmonary mets, malignant pleural effusion: progressing dieasease. Followed by medical oncology, Dr. Burr Medico. Currently on chemotherapy, but has missed her last 2 doses now. CT angiogram chest notable for progression of disease in the lungs, hilum, mediastinum with increased left axillary and left chest wall lymph nodes with associated bony and liver metastasis. Last outpatient visit with Dr. Burr Medico on 02/09/2020 with treatment goals now palliative with discussions to consider further goals of care. --CT abdomen/pelvis without contrast: No acute abdominal pelvic finding, 3 cm segment 6 liver lesions consistent with metastatic disease  --Palliative care consulted for assistance  with goals of care and medical decision making as her cancer seems to be significantly progressed with ultimately poor/grim prognosis. Plan is to have family meeting on Monday at oncology office for further care/hospice  Essential hypertension: stable.Continue amlodipine 10 mg p.o. daily, carvedilol 25 mg p.o. twice daily- hold furosemide 20 mg and lisinopril due to aki/high potassium  Hyperlipidemia:Continue pravastatin  Bilateral lower extremity edema-negative for DVT.  Obesity with BMI 32. POC discussed with pt and her daughter in law. She prefers to go home today on o2.  Consults:  Hem-Onc  PULM  Subjective: Doing well feels okay. No shortness of breath. shortness of breath  Discharge Exam: Vitals:   03/04/20 2059 03/05/20 0633  BP: 98/68 132/70  Pulse: 69 64  Resp: 18 16  Temp: 98.7 F (37.1 C) 98.4 F (36.9 C)  SpO2: 100% 100%   General: Pt is alert, awake, not in acute distress Cardiovascular: RRR, S1/S2 +, no rubs, no gallops Respiratory: CTA bilaterally, no wheezing, no rhonchi Abdominal: Soft, NT, ND, bowel sounds + Extremities: no edema, no cyanosis  Discharge Instructions  Discharge Instructions    (HEART FAILURE PATIENTS) Call MD:  Anytime you have any of the following symptoms: 1) 3 pound weight gain in 24 hours or 5 pounds in 1 week 2) shortness of breath, with or without a dry hacking cough 3) swelling in the hands, feet or stomach 4) if you have to sleep on extra pillows at night in order to breathe.   Complete by: As directed    Diet - low  sodium heart healthy   Complete by: As directed    Diet Carb Modified   Complete by: As directed    Discharge instructions   Complete by: As directed    Hold your Lasix and lisinopril until seen by your doctor next week.  Please follow-up with Dr. Burr Medico on Monday as scheduled.  Please call call MD or return to ER for similar or worsening recurring problem that brought you to hospital or if any  fever,nausea/vomiting,abdominal pain, uncontrolled pain, chest pain,  shortness of breath or any other alarming symptoms.  Please follow-up your doctor as instructed in a week time and call the office for appointment.  Please avoid alcohol, smoking, or any other illicit substance and maintain healthy habits including taking your regular medications as prescribed.  You were cared for by a hospitalist during your hospital stay. If you have any questions about your discharge medications or the care you received while you were in the hospital after you are discharged, you can call the unit and ask to speak with the hospitalist on call if the hospitalist that took care of you is not available.  Once you are discharged, your primary care physician will handle any further medical issues. Please note that NO REFILLS for any discharge medications will be authorized once you are discharged, as it is imperative that you return to your primary care physician (or establish a relationship with a primary care physician if you do not have one) for your aftercare needs so that they can reassess your need for medications and monitor your lab values   Increase activity slowly   Complete by: As directed      Allergies as of 03/05/2020   No Known Allergies     Medication List    STOP taking these medications   furosemide 20 MG tablet Commonly known as: LASIX   lisinopril 20 MG tablet Commonly known as: ZESTRIL   predniSONE 10 MG tablet Commonly known as: DELTASONE     TAKE these medications   albuterol 108 (90 Base) MCG/ACT inhaler Commonly known as: VENTOLIN HFA Inhale 2 puffs into the lungs every 6 (six) hours as needed for wheezing or shortness of breath.   amLODipine 10 MG tablet Commonly known as: NORVASC TAKE 1 TABLET BY MOUTH EVERY DAY   Insulin Syringe-Needle U-100 30G X 1/2" 0.5 ML Misc Commonly known as: B-D INS SYRINGE 0.5CC/30GX1/2" USE AS DIRECTED 4 TIMES A DAY   B-D INS SYRINGE  0.5CC/31GX5/16 31G X 5/16" 0.5 ML Misc Generic drug: Insulin Syringe-Needle U-100 USE AS DIRECTED 4 TIMES A DAY   Breo Ellipta 100-25 MCG/INH Aepb Generic drug: fluticasone furoate-vilanterol Inhale 1 puff into the lungs daily. What changed: Another medication with the same name was removed. Continue taking this medication, and follow the directions you see here.   busPIRone 10 MG tablet Commonly known as: BUSPAR Take 1 tablet (10 mg total) by mouth 3 (three) times daily.   carvedilol 25 MG tablet Commonly known as: COREG Take 1 tablet (25 mg total) by mouth 2 (two) times daily with a meal.   cloNIDine 0.3 MG tablet Commonly known as: CATAPRES TAKE 1 TABLET (0.3 MG TOTAL) BY MOUTH 2 (TWO) TIMES DAILY.   famotidine 20 MG tablet Commonly known as: PEPCID TAKE 1 TABLET BY MOUTH EVERY DAY   fluticasone 50 MCG/ACT nasal spray Commonly known as: FLONASE Place 1 spray into both nostrils daily.   HYDROcodone-acetaminophen 5-325 MG tablet Commonly known as: NORCO/VICODIN Take 1 tablet  by mouth every 8 (eight) hours as needed for moderate pain.   Insulin Pen Needle 31G X 5 MM Misc Use pen needles for insulin injection daily   insulin regular 100 units/mL injection Commonly known as: NOVOLIN R Inject 0.14-0.18 mLs (14-18 Units total) into the skin 3 (three) times daily before meals. Relion. Please provide insulin syringes needed What changed:   how much to take  when to take this  additional instructions   Lantus SoloStar 100 UNIT/ML Solostar Pen Generic drug: insulin glargine Inject 28 units into the skin in am What changed:   how much to take  how to take this  when to take this  additional instructions   lidocaine-prilocaine cream Commonly known as: EMLA Apply to affected area once   lovastatin 40 MG tablet Commonly known as: MEVACOR TAKE 1 TABLET BY MOUTH EVERYDAY AT BEDTIME What changed: See the new instructions.   mirtazapine 7.5 MG tablet Commonly  known as: REMERON Take 1 tablet (7.5 mg total) by mouth at bedtime.   montelukast 10 MG tablet Commonly known as: Singulair Take 1 tablet (10 mg total) by mouth at bedtime.   ondansetron 8 MG tablet Commonly known as: Zofran Take 1 tablet (8 mg total) by mouth 2 (two) times daily as needed (Nausea or vomiting).   polyethylene glycol powder 17 GM/SCOOP powder Commonly known as: GLYCOLAX/MIRALAX Take 17 g by mouth daily. What changed:   when to take this  reasons to take this   traMADol 50 MG tablet Commonly known as: ULTRAM TAKE 1 TABLET BY MOUTH EVERY 6 HOURS AS NEEDED FOR MODERATE/SEVERE PAIN (NECK AND SHOULDER PAIN). What changed: See the new instructions.            Durable Medical Equipment  (From admission, onward)         Start     Ordered   03/04/20 1043  For home use only DME Walker rolling  Once    Question Answer Comment  Walker: With 5 Inch Wheels   Patient needs a walker to treat with the following condition Weakness      03/04/20 1043         Follow-up Information    Marin Olp, MD Follow up in 1 week(s).   Specialty: Family Medicine Contact information: [place] [address] [phone]        [name], MD Follow up.   Specialties: Hematology, Oncology Why: on monday Contact information: [place] [address] [phone]          No Known Allergies  The results of significant diagnostics from this hospitalization (including imaging, microbiology, ancillary and laboratory) are listed below for reference.    Microbiology: Recent Results (from the past 240 hour(s))  Culture, body fluid-bottle     Status: None   Collection Time: 02/24/20  4:07 PM   Specimen: Fluid  Result Value Ref Range Status   Specimen Description FLUID PLEURAL  Final   Special Requests BOTTLES DRAWN AEROBIC AND ANAEROBIC  Final   Culture   Final    NO GROWTH 5 DAYS Performed at [facility], [address] [place], [place] [address]    Report Status 02/29/2020 FINAL  Final  Gram stain     Status: None   Collection Time: 02/24/20  4:07 PM   Specimen: Fluid  Result Value Ref Range Status   Specimen Description FLUID PLEURAL  Final   Special Requests NONE  Final  Gram Stain   Final    FEW WBC PRESENT, PREDOMINANTLY MONONUCLEAR NO ORGANISMS SEEN Performed at [facility] [address], [place], [place] [address]    Report Status 02/25/2020 FINAL  Final  SARS Coronavirus 2 by RT PCR (hospital order, performed in [facility] hospital lab) Nasopharyngeal Nasopharyngeal Swab     Status: None   Collection Time: 03/02/20  3:34 AM   Specimen: Nasopharyngeal Swab  Result Value Ref Range Status   SARS Coronavirus 2 NEGATIVE NEGATIVE Final    Comment: (NOTE) SARS-CoV-2 target nucleic acids are NOT DETECTED. The SARS-CoV-2 RNA is generally detectable in upper and lower respiratory specimens during the acute phase of infection. The lowest concentration of SARS-CoV-2 viral copies this assay can detect is 250 copies / mL. A negative result does not preclude SARS-CoV-2 infection and should not be used as the sole basis for treatment or other patient management decisions.  A negative result may occur with improper specimen collection / handling, submission of specimen other than nasopharyngeal swab, presence of viral mutation(s) within the areas targeted by this assay, and inadequate number of viral copies (<250 copies / mL). A negative result must be combined with clinical observations, patient history, and epidemiological information. Fact Sheet for Patients:   StrictlyIdeas.no Fact Sheet for Healthcare Providers: BankingDealers.co.za This test is not yet approved or cleared  by the [place] FDA and has been authorized for detection and/or diagnosis of SARS-CoV-2 by FDA under an Emergency Use Authorization (EUA).  This EUA  will remain in effect (meaning this test can be used) for the duration of the COVID-19 declaration under Section 564(b)(1) of the Act, 21 U.S.C. section 360bbb-3(b)(1), unless the authorization is terminated or revoked sooner. Performed at [facility], [place] [address] [place], [place] [address]   Body fluid culture     Status: None   Collection Time: 03/02/20  2:09 PM   Specimen: PATH Cytology Pleural fluid  Result Value Ref Range Status   Specimen Description FLUID PLEURAL  Final   Special Requests NONE  Final   Gram Stain   Final    RARE WBC PRESENT, PREDOMINANTLY MONONUCLEAR NO ORGANISMS SEEN    Culture   Final    NO GROWTH 3 DAYS Performed at [facility], [place] [address], [place], [place] [address]    Report Status 03/05/2020 FINAL  Final    Procedures/Studies: CT ABDOMEN PELVIS WO CONTRAST  Result Date: 03/02/2020 CLINICAL DATA:  Abdominal distension. History of malignancy. Evaluate for possible bowel obstruction. EXAM: CT ABDOMEN AND PELVIS WITHOUT CONTRAST TECHNIQUE: Multidetector CT imaging of the abdomen and pelvis was performed following the standard protocol without IV contrast. COMPARISON:  Chest CT, same date and prior abdominal CT scan 01/17/2018 FINDINGS: Lower chest: There are bilateral pleural effusions and numerous small metastatic pulmonary nodules. The heart is normal in size. Small amount of pericardial fluid without overt effusion. Hepatobiliary: 3 cm segment 6 liver lesion again demonstrated. No other obvious hepatic lesions without contrast. The gallbladder appears normal. No intra or extrahepatic biliary dilatation. Pancreas: Advanced pancreatic atrophy but no mass or inflammation. Spleen: Normal size.  No focal lesions. Adrenals/Urinary Tract: The adrenal glands are unremarkable. Some residual contrast in the collecting system of both kidneys from the recent chest CT. Prior numerous bilateral renal cysts but no worrisome renal lesions or  hydronephrosis. The bladder contains contrast. No bladder mass or asymmetric bladder wall thickening. A mild to moderate cystocele is noted. Stomach/Bowel: The  stomach, duodenum, small bowel and colon are unremarkable. No acute inflammatory changes, mass lesions or obstructive findings. The terminal ileum and appendix are normal. Contrast gets all the way to the colon and there are no dilated small bowel loops. Vascular/Lymphatic: Scattered atherosclerotic calcifications involving the aorta and iliac arteries but no aneurysm. No mesenteric or retroperitoneal mass or lymphadenopathy. Reproductive: Enlarged fibroid uterus with calcified fibroids. The ovaries are normal. Other: No abdominal wall hernia or subcutaneous lesions are identified. No free abdominal/pelvic fluid collections. Musculoskeletal: No significant bony findings. IMPRESSION: 1. No acute abdominal/pelvic findings or lymphadenopathy. No findings for small bowel obstruction. 2. 3 cm segment 6 liver lesion consistent with metastatic disease. 3. Enlarged fibroid uterus with calcified fibroids. 4. Bilateral pleural effusions and numerous small metastatic pulmonary nodules. Aortic Atherosclerosis (ICD10-I70.0). Electronically Signed   By: Marijo Sanes M.D.   On: 03/02/2020 10:58   DG Chest 1 View  Result Date: 03/02/2020 CLINICAL DATA:  Status post thoracentesis. Additional history provided: Post right thoracentesis, patient still having some shortness of breath, no unusual pain. EXAM: CHEST  1 VIEW COMPARISON:  CT angiogram chest 10/03/2019, chest radiograph 03/01/2020. FINDINGS: Unchanged cardiomediastinal silhouette. Interval decrease in size of a right pleural effusion consistent with provided history of interval thoracentesis. Unchanged trace left pleural effusion. Multiple pulmonary nodules were better appreciated on chest CT performed earlier the same day. No evidence of pneumothorax. Surgical clips within the left axilla. Known osseous metastatic  disease better appreciated on same-day chest CT. IMPRESSION: Interval decrease in conspicuity of a right pleural effusion consistent with interval thoracentesis. No evidence of pneumothorax. Additional findings without interval change as described. Electronically Signed   By: Kellie Simmering DO   On: 03/02/2020 14:12   DG Chest 2 View  Result Date: 03/01/2020 CLINICAL DATA:  Patient reports sob and cough with chest congestion for 3 months. Reports thoracentesis 2 days ago. EXAM: CHEST - 2 VIEW COMPARISON:  Chest radiograph 01/26/2020, 02/24/2020 FINDINGS: Stable cardiomediastinal contours. There is a small right pleural effusion with adjacent opacities possibly representing atelectasis. There is a small nonspecific opacity in the right mid lung which appears new from prior. The left lung is clear. No pneumothorax. No acute finding in the visualized skeleton. IMPRESSION: 1. Small right pleural effusion with adjacent opacities possibly representing atelectasis. 2. New small focal opacity in the right mid lung is nonspecific, possibly atelectasis though early infection not excluded. Electronically Signed   By: Audie Pinto M.D.   On: 03/01/2020 17:50   CT Angio Chest PE W and/or Wo Contrast  Result Date: 03/02/2020 CLINICAL DATA:  Shortness of breath, history of recent thoracentesis EXAM: CT ANGIOGRAPHY CHEST WITH CONTRAST TECHNIQUE: Multidetector CT imaging of the chest was performed using the standard protocol during bolus administration of intravenous contrast. Multiplanar CT image reconstructions and MIPs were obtained to evaluate the vascular anatomy. CONTRAST:  53mL OMNIPAQUE IOHEXOL 350 MG/ML SOLN COMPARISON:  Chest x-ray from the previous day, CT from 10/31/2019, PET-CT from 01/07/2020 FINDINGS: Cardiovascular: Thoracic aorta and its branches are well visualize without aneurysmal dilatation or dissection. No cardiac enlargement is seen. Mild pericardial effusion is noted. The pulmonary artery shows a  normal branching pattern. No definitive filling defect to suggest pulmonary embolism is seen. Mediastinum/Nodes: Thoracic inlet shows lymphadenopathy in the left supraclavicular region as well as posterior to the clavicle on the left. Scattered mediastinal lymph nodes are noted in the pre-vascular region as well as AP window slightly increased when compared with the prior exam. Additionally there  are hilar lymph nodes identified as well as subcarinal adenopathy increased from the prior study. Subcarinal lymph node measures at least 2.1 cm in short axis and previously measured approximately 1 cm in short axis on prior PET-CT. Right hilar lymph node measures 2.1 cm in short axis also increased from the prior study. Significant bulky adenopathy is noted in the left axilla as well as a focal soft tissue mass lesion which measures 5.1 by 2.5 cm best seen on image number 136 of series 7. This is consistent with recurrent disease and is similar to that seen on recent PET-CT. Bulky lymph nodes are noted associated with this mass which appear increased when compared with the prior exam consistent with progression of disease. Multiple subpectoral lymph nodes are noted on the left also consistent with progressive disease. Lungs/Pleura: Small left pleural effusion is noted consistent with the recent thoracentesis. Moderate right-sided pleural effusion is noted. The lungs demonstrate multiple parenchymal nodules which have increased in size and number when compared with the prior exam. Largest of these on the left is posterior along the pleural margin measuring 1.9 cm. The largest of these on the right is seen in the upper lobe measuring approximately 11 mm. Right lower lobe consolidation is noted of a mild degree. Upper Abdomen: Visualized upper abdomen demonstrates multiple renal cysts. Additionally there is a rounded hypodensity in the posterior aspect of the right lobe of the liver best seen on image number 354 of series 7  consistent with metastatic disease. This is stable from the prior PET-CT. Musculoskeletal: Degenerative changes of the thoracic spine are noted. Medial scapular metastatic lesion is noted on the left similar to that seen on prior PET-CT. Pathologic fracture is again noted involving the right eighth rib posteriorly. Some mottled areas of bony density are noted within the ribcage consistent with metastatic disease. Review of the MIP images confirms the above findings. IMPRESSION: No evidence of pulmonary emboli. Progression of disease particularly within the lungs, hila and mediastinum consistent with the known history of breast carcinoma with recurrence. Persistent left breast mass is noted with increasing left axillary and left chest wall lymphadenopathy. Bilateral pleural effusions right greater than left. Bony metastatic disease. Electronically Signed   By: Inez Catalina M.D.   On: 03/02/2020 01:41   DG Chest Port 1 View  Result Date: 02/24/2020 CLINICAL DATA:  Post left thoracentesis EXAM: PORTABLE CHEST 1 VIEW COMPARISON:  01/26/2020 FINDINGS: No residual pleural effusion on the left. No complication or pneumothorax Small right pleural effusion slightly increased from the prior study. Mild right lower lobe atelectasis. Negative for heart failure or edema Left mastectomy with clips in the left axilla IMPRESSION: No significant left pleural effusion following thoracentesis. No pneumothorax Progression of small right effusion and right lower lobe atelectasis since 01/26/2020. Electronically Signed   By: Franchot Gallo M.D.   On: 02/24/2020 16:23   ECHOCARDIOGRAM COMPLETE  Result Date: 02/05/2020    ECHOCARDIOGRAM REPORT   Patient Name:   BONNI NEUSER Date of Exam: 02/05/2020 Medical Rec #:  [id number]    Height:       62.0 in Accession #:    [id number]   Weight:       201.0 lb Date of Birth:  1941-08-26    BSA:          1.916 m Patient Age:    79 years     BP:           144/54 mmHg Patient Gender: F  HR:           70 bpm. Exam Location:  Outpatient Procedure: 2D Echo, Cardiac Doppler, Color Doppler and Strain Analysis Indications:    Dyspnea 786.09/ / R06.00  History:        Patient has prior history of Echocardiogram examinations, most                 recent 08/12/2018. Risk Factors:Hypertension, Diabetes and GERD.                 CKD.  Sonographer:    Jonelle Sidle Dance Referring Phys: [id number] [place]  1. Left ventricular ejection fraction, by estimation, is 60 to 65%. The left ventricle has normal function. The left ventricle has no regional wall motion abnormalities. There is mild left ventricular hypertrophy. Left ventricular diastolic parameters are consistent with Grade I diastolic dysfunction (impaired relaxation). The average left ventricular global longitudinal strain is -17.8 %.  2. Right ventricular systolic function is normal. The right ventricular size is normal. There is mildly elevated pulmonary artery systolic pressure. The estimated right ventricular systolic pressure is 76.1 mmHg.  3. The pericardial effusion is circumferential. There is no evidence of cardiac tamponade. Moderate pleural effusion in the left lateral region.  4. The mitral valve is degenerative. Trivial mitral valve regurgitation. No evidence of mitral stenosis.  5. The aortic valve is normal in structure. Aortic valve regurgitation is not visualized. No aortic stenosis is present.  6. Pulmonic valve regurgitation is moderate.  7. The inferior vena cava is dilated in size with >50% respiratory variability, suggesting right atrial pressure of 8 mmHg. Comparison(s): No significant change from prior study. Prior images reviewed side by side. FINDINGS  Left Ventricle: Left ventricular ejection fraction, by estimation, is 60 to 65%. The left ventricle has normal function. The left ventricle has no regional wall motion abnormalities. The average left ventricular global longitudinal strain is -17.8 %. The left  ventricular internal cavity size was normal in size. There is mild left ventricular hypertrophy. Left ventricular diastolic parameters are consistent with Grade I diastolic dysfunction (impaired relaxation). Right Ventricle: The right ventricular size is normal. No increase in right ventricular wall thickness. Right ventricular systolic function is normal. There is mildly elevated pulmonary artery systolic pressure. The tricuspid regurgitant velocity is 2.76  m/s, and with an assumed right atrial pressure of 8 mmHg, the estimated right ventricular systolic pressure is 95.0 mmHg. Left Atrium: Left atrial size was normal in size. Right Atrium: Right atrial size was normal in size. Pericardium: Trivial pericardial effusion is present. The pericardial effusion is circumferential. There is no evidence of cardiac tamponade. Mitral Valve: The mitral valve is degenerative in appearance. There is mild thickening of the mitral valve leaflet(s). There is mild calcification of the mitral valve leaflet(s). Normal mobility of the mitral valve leaflets. Trivial mitral valve regurgitation. No evidence of mitral valve stenosis. Tricuspid Valve: The tricuspid valve is normal in structure. Tricuspid valve regurgitation is mild . No evidence of tricuspid stenosis. Aortic Valve: The aortic valve is normal in structure. Aortic valve regurgitation is not visualized. No aortic stenosis is present. Pulmonic Valve: The pulmonic valve was normal in structure. Pulmonic valve regurgitation is moderate. No evidence of pulmonic stenosis. Aorta: The aortic root is normal in size and structure. Venous: The inferior vena cava is dilated in size with greater than 50% respiratory variability, suggesting right atrial pressure of 8 mmHg. IAS/Shunts: No atrial level shunt detected by color flow Doppler. Additional Comments:  There is a moderate pleural effusion in the left lateral region.  LEFT VENTRICLE PLAX 2D LVIDd:         3.90 cm  Diastology LVIDs:          3.00 cm  LV e' lateral:   6.31 cm/s LV PW:         1.40 cm  LV E/e' lateral: 10.4 LV IVS:        1.10 cm  LV e' medial:    4.36 cm/s LVOT diam:     1.90 cm  LV E/e' medial:  15.0 LV SV:         63 LV SV Index:   33       2D Longitudinal Strain LVOT Area:     2.84 cm 2D Strain GLS Avg:     -17.8 %  RIGHT VENTRICLE             IVC RV Basal diam:  2.50 cm     IVC diam: 2.20 cm RV S prime:     12.30 cm/s TAPSE (M-mode): 1.4 cm LEFT ATRIUM             Index       RIGHT ATRIUM           Index LA diam:        4.20 cm 2.19 cm/m  RA Area:     12.00 cm LA Vol (A2C):   34.4 ml 17.96 ml/m RA Volume:   24.30 ml  12.68 ml/m LA Vol (A4C):   42.9 ml 22.39 ml/m LA Biplane Vol: 38.5 ml 20.10 ml/m  AORTIC VALVE LVOT Vmax:   92.40 cm/s LVOT Vmean:  58.300 cm/s LVOT VTI:    0.221 m  AORTA Ao Root diam: 3.50 cm Ao Asc diam:  2.80 cm MITRAL VALVE               TRICUSPID VALVE MV Area (PHT): 3.12 cm    TR Peak grad:   30.5 mmHg MV Decel Time: 243 msec    TR Vmax:        276.00 cm/s MV E velocity: 65.50 cm/s MV A velocity: 66.00 cm/s  SHUNTS MV E/A ratio:  0.99        Systemic VTI:  0.22 m                            Systemic Diam: 1.90 cm Candee Furbish MD Electronically signed by Candee Furbish MD Signature Date/Time: 02/05/2020/2:07:07 PM    Final    VAS [name] LOWER EXTREMITY VENOUS (DVT)  Result Date: 03/02/2020  Lower Venous DVTStudy Indications: Pain, and Edema.  Limitations: Pain, poor ultrasound/tissue interface and body habitus. Performing Technologist: Antonieta Pert RDMS, RVT  Examination Guidelines: A complete evaluation includes B-mode imaging, spectral Doppler, color Doppler, and power Doppler as needed of all accessible portions of each vessel. Bilateral testing is considered an integral part of a complete examination. Limited examinations for reoccurring indications may be performed as noted. The reflux portion of the exam is performed with the patient in reverse Trendelenburg.   +---------+---------------+---------+-----------+----------+-------------------+ RIGHT    CompressibilityPhasicitySpontaneityPropertiesThrombus Aging      +---------+---------------+---------+-----------+----------+-------------------+ CFV      Full           Yes      Yes                                      +---------+---------------+---------+-----------+----------+-------------------+  SFJ      Full                                                             +---------+---------------+---------+-----------+----------+-------------------+ FV Prox  Full                                                             +---------+---------------+---------+-----------+----------+-------------------+ FV Mid   Full                                                             +---------+---------------+---------+-----------+----------+-------------------+ FV Distal               Yes      Yes                  could not tolerate                                                        compression         +---------+---------------+---------+-----------+----------+-------------------+ PFV      Full                                                             +---------+---------------+---------+-----------+----------+-------------------+ POP                                                   Not visualized      +---------+---------------+---------+-----------+----------+-------------------+ PTV                     Yes                           could not tolerate                                                        compression         +---------+---------------+---------+-----------+----------+-------------------+ PERO                    Yes                           could not tolerate  compression         +---------+---------------+---------+-----------+----------+-------------------+ GSV       Full                                                             +---------+---------------+---------+-----------+----------+-------------------+   +---------+---------------+---------+-----------+----------+-------------------+ LEFT     CompressibilityPhasicitySpontaneityPropertiesThrombus Aging      +---------+---------------+---------+-----------+----------+-------------------+ CFV      Full           Yes      Yes                                      +---------+---------------+---------+-----------+----------+-------------------+ SFJ      Full                                                             +---------+---------------+---------+-----------+----------+-------------------+ FV Prox                 Yes                           could not tolerate                                                        compression         +---------+---------------+---------+-----------+----------+-------------------+ FV Mid                  Yes                           could not tolerate                                                        compression         +---------+---------------+---------+-----------+----------+-------------------+ FV Distal               Yes      Yes                  could not tolerate                                                        compression         +---------+---------------+---------+-----------+----------+-------------------+ POP                     Yes      Yes  could not tolerate                                                        compression         +---------+---------------+---------+-----------+----------+-------------------+ PTV                     Yes                           could not tolerate                                                        compression         +---------+---------------+---------+-----------+----------+-------------------+ PERO                     Yes                           could not tolerate                                                        compression         +---------+---------------+---------+-----------+----------+-------------------+ GSV      Full                                                             +---------+---------------+---------+-----------+----------+-------------------+     Summary: RIGHT: - There is no evidence of deep vein thrombosis in the lower extremity. However, portions of this examination were limited- see technologist comments above.  - No cystic structure found in the popliteal fossa.  LEFT: - There is no evidence of deep vein thrombosis in the lower extremity. However, portions of this examination were limited- see technologist comments above.  - No cystic structure found in the popliteal fossa. - Patient could not tolerate compression and almost did not tolerate any touch. best images possible.  *See table(s) above for measurements and observations. Electronically signed by Deitra Mayo MD on 03/02/2020 at 6:04:43 PM.    Final    IR THORACENTESIS ASP PLEURAL SPACE W/IMG GUIDE  Result Date: 03/02/2020 INDICATION: SHORTNESS OF BREATH, RIGHT PLEURAL EFFUSION EXAM: ULTRASOUND GUIDED RIGHT THORACENTESIS MEDICATIONS: 1% lidocaine local COMPLICATIONS: None immediate. PROCEDURE: An ultrasound guided thoracentesis was thoroughly discussed with the patient and questions answered. The benefits, risks, alternatives and complications were also discussed. The patient understands and wishes to proceed with the procedure. Written consent was obtained. Ultrasound was performed to localize and mark an adequate pocket of fluid in the right chest. The area was then prepped and draped in the normal sterile fashion. 1% Lidocaine was used for local anesthesia. Under ultrasound guidance a 6 Fr Safe-T-Centesis catheter was introduced. Thoracentesis was  performed. The catheter was removed and a  dressing applied. FINDINGS: A total of approximately 300 cc of blood tinged pleural fluid was removed. Samples were sent to the laboratory as requested by the clinical team. IMPRESSION: Successful ultrasound guided right thoracentesis yielding 300 cc of pleural fluid. Electronically Signed   By: Jerilynn Mages.  Shick M.D.   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 Interventional radiology asked to perform a therapeutic and diagnostic thoracentesis. EXAM: ULTRASOUND GUIDED LEFT THORACENTESIS MEDICATIONS: 1% lidocaine 10 mL COMPLICATIONS: None immediate. PROCEDURE: An ultrasound guided thoracentesis was thoroughly discussed with the patient and questions answered. The benefits, risks, alternatives and complications were also discussed. The patient understands and wishes to proceed with the procedure. Written consent was obtained. Ultrasound was performed to localize and mark an adequate pocket of fluid in the left chest. The area was then prepped and draped in the normal sterile fashion. 1% Lidocaine was used for local anesthesia. Under ultrasound guidance a 6 Fr Safe-T-Centesis catheter was introduced. Thoracentesis was performed. The catheter was removed and a dressing applied. FINDINGS: A total of approximately 320 ml of amber fluid was removed. Samples were sent to the laboratory as requested by the clinical team. IMPRESSION: Successful ultrasound guided left thoracentesis yielding 320 ml of pleural fluid. Read by: Soyla Dryer, NP Electronically Signed   By: Jacqulynn Cadet M.D.   On: 02/24/2020 16:27    Labs: BNP (last 3 results) Recent Labs    03/02/20 0932  BNP 23.5   Basic Metabolic Panel: Recent Labs  Lab 03/03/20 0435 03/03/20 1503 03/04/20 0143 03/05/20 0347 03/05/20 1330  NA 136 136 136 138 137  K 5.8* 5.2* 5.4* 5.1 5.0  CL 105 106 106 109 111  CO2 23  21* 21* 20* 19*  GLUCOSE 321* 213* 196* 299* 301*  BUN 31* 41* 49* 56* 54*  CREATININE 1.86* 2.06* 2.24* 2.25* 1.96*  CALCIUM 8.8* 8.6* 8.5* 8.1* 7.9*  MG 2.3  --   --   --   --    Liver Function Tests: Recent Labs  Lab 03/02/20 0932  PROT 6.7   No results for input(s): LIPASE, AMYLASE in the last 168 hours. No results for input(s): AMMONIA in the last 168 hours. CBC: Recent Labs  Lab 03/01/20 1731  WBC 7.3  HGB 12.6  HCT 37.9  MCV 90.0  PLT 371   Cardiac Enzymes: No results for input(s): CKTOTAL, CKMB, CKMBINDEX, TROPONINI in the last 168 hours. BNP: Invalid input(s): POCBNP CBG: Recent Labs  Lab 03/04/20 1150 03/04/20 1815 03/04/20 2106 03/05/20 0819 03/05/20 1231  GLUCAP 289* 312* 283* 314* 235*   D-Dimer No results for input(s): DDIMER in the last 72 hours. Hgb A1c No results for input(s): HGBA1C in the last 72 hours. Lipid Profile No results for input(s): CHOL, HDL, LDLCALC, TRIG, CHOLHDL, LDLDIRECT in the last 72 hours. Thyroid function studies No results for input(s): TSH, T4TOTAL, T3FREE, THYROIDAB in the last 72 hours.  Invalid input(s): FREET3 Anemia work up No results for input(s): VITAMINB12, FOLATE, FERRITIN, TIBC, IRON, RETICCTPCT in the last 72 hours. Urinalysis    Component Value Date/Time   COLORURINE YELLOW 01/09/2018 1230   APPEARANCEUR CLEAR 01/09/2018 1230   LABSPEC 1.014 01/09/2018 1230   PHURINE 5.0 01/09/2018 [place] 01/09/2018 [place] 01/09/2018 1230   BILIRUBINUR Negative 10/07/2019 1121  KETONESUR NEGATIVE 01/09/2018 1230   PROTEINUR Positive (A) 10/07/2019 1121   PROTEINUR 100 (A) 01/09/2018 1230   UROBILINOGEN 0.2 10/07/2019 1121   NITRITE Negative 10/07/2019 1121   NITRITE NEGATIVE 01/09/2018 1230   LEUKOCYTESUR Negative 10/07/2019 1121   Sepsis Labs Invalid input(s): PROCALCITONIN,  WBC,  LACTICIDVEN Microbiology Recent Results (from the past 240 hour(s))  Culture, body fluid-bottle      Status: None   Collection Time: 02/24/20  4:07 PM   Specimen: Fluid  Result Value Ref Range Status   Specimen Description FLUID PLEURAL  Final   Special Requests BOTTLES DRAWN AEROBIC AND ANAEROBIC  Final   Culture   Final    NO GROWTH 5 DAYS Performed at [facility], [place] [address], [place], [place] [address]    Report Status 02/29/2020 FINAL  Final  Gram stain     Status: None   Collection Time: 02/24/20  4:07 PM   Specimen: Fluid  Result Value Ref Range Status   Specimen Description FLUID PLEURAL  Final   Special Requests NONE  Final   Gram Stain   Final    FEW WBC PRESENT, PREDOMINANTLY MONONUCLEAR NO ORGANISMS SEEN Performed at [facility], [place] [name] [place], [place] [address]    Report Status 02/25/2020 FINAL  Final  SARS Coronavirus 2 by RT PCR (hospital order, performed in [facility] hospital lab) Nasopharyngeal Nasopharyngeal Swab     Status: None   Collection Time: 03/02/20  3:34 AM   Specimen: Nasopharyngeal Swab  Result Value Ref Range Status   SARS Coronavirus 2 NEGATIVE NEGATIVE Final    Comment: (NOTE) SARS-CoV-2 target nucleic acids are NOT DETECTED. The SARS-CoV-2 RNA is generally detectable in upper and lower respiratory specimens during the acute phase of infection. The lowest concentration of SARS-CoV-2 viral copies this assay can detect is 250 copies / mL. A negative result does not preclude SARS-CoV-2 infection and should not be used as the sole basis for treatment or other patient management decisions.  A negative result may occur with improper specimen collection / handling, submission of specimen other than nasopharyngeal swab, presence of viral mutation(s) within the areas targeted by this assay, and inadequate number of viral copies (<250 copies / mL). A negative result must be combined with clinical observations, patient history, and epidemiological information. Fact Sheet for Patients:    StrictlyIdeas.no Fact Sheet for Healthcare Providers: BankingDealers.co.za This test is not yet approved or cleared  by the [place] FDA and has been authorized for detection and/or diagnosis of SARS-CoV-2 by FDA under an Emergency Use Authorization (EUA).  This EUA will remain in effect (meaning this test can be used) for the duration of the COVID-19 declaration under Section 564(b)(1) of the Act, 21 U.S.C. section 360bbb-3(b)(1), unless the authorization is terminated or revoked sooner. Performed at [facility], [place] [address], [place], [place] [address]   Body fluid culture     Status: None   Collection Time: 03/02/20  2:09 PM   Specimen: PATH Cytology Pleural fluid  Result Value Ref Range Status   Specimen Description FLUID PLEURAL  Final   Special Requests NONE  Final   Gram Stain   Final    RARE WBC PRESENT, PREDOMINANTLY MONONUCLEAR NO ORGANISMS SEEN    Culture   Final    NO GROWTH 3 DAYS Performed at [facility], [place] [address] [place], [place] [address]    Report Status 03/05/2020 FINAL  Final  Time coordinating discharge: 35 minutes  SIGNED: Antonieta Pert, MD  [facility] 03/05/2020, 2:31 PM  If 7PM-7AM, please contact night-coverage [email or web address]

## 2020-03-05 NOTE — Plan of Care (Signed)

## 2020-03-05 NOTE — Consult Note (Signed)
   [facility] Inpatient Consult   03/05/2020  SHYNE RESCH [birth year] [id number]   [facility] [ACO] Patient:  Medicare  Patient screened for high risk score for unplanned readmission score with this initial  Hospitalization in the past 6 months.  Medical recorded  to check if potential [facility] service needs.  Review of patient's medical record reveals patient is for home palliative program.  Primary Care Provider is Garret Reddish, MD,  this provider is listed to provide the transition of care [TOC] for post hospital follow up.   Plan:  No current needs assessed for [facility] CM noted as home palliative planned and with [facility] needs arranged.    For questions contact:   Natividad Brood, RN BSN [facility] Liaison  [phone] business mobile phone Toll free office [phone]  Fax number: [phone] [email or web address]

## 2020-03-05 NOTE — Plan of Care (Signed)

## 2020-03-08 ENCOUNTER — Telehealth: Payer: Self-pay

## 2020-03-08 ENCOUNTER — Encounter: Payer: Self-pay | Admitting: Hematology

## 2020-03-08 ENCOUNTER — Inpatient Hospital Stay: Payer: Medicare Other

## 2020-03-08 ENCOUNTER — Other Ambulatory Visit: Payer: Self-pay

## 2020-03-08 ENCOUNTER — Inpatient Hospital Stay: Payer: Medicare Other | Attending: Hematology | Admitting: Hematology

## 2020-03-08 VITALS — BP 112/97 | HR 69 | Temp 97.7°F | Resp 17 | Ht 62.0 in | Wt 209.3 lb

## 2020-03-08 DIAGNOSIS — Z170 Estrogen receptor positive status [ER+]: Secondary | ICD-10-CM

## 2020-03-08 DIAGNOSIS — I129 Hypertensive chronic kidney disease with stage 1 through stage 4 chronic kidney disease, or unspecified chronic kidney disease: Secondary | ICD-10-CM | POA: Insufficient documentation

## 2020-03-08 DIAGNOSIS — M8580 Other specified disorders of bone density and structure, unspecified site: Secondary | ICD-10-CM | POA: Diagnosis not present

## 2020-03-08 DIAGNOSIS — D649 Anemia, unspecified: Secondary | ICD-10-CM | POA: Insufficient documentation

## 2020-03-08 DIAGNOSIS — Z1152 Encounter for screening for COVID-19: Secondary | ICD-10-CM | POA: Diagnosis not present

## 2020-03-08 DIAGNOSIS — N183 Chronic kidney disease, stage 3 unspecified: Secondary | ICD-10-CM | POA: Insufficient documentation

## 2020-03-08 DIAGNOSIS — J189 Pneumonia, unspecified organism: Secondary | ICD-10-CM | POA: Diagnosis present

## 2020-03-08 DIAGNOSIS — C7951 Secondary malignant neoplasm of bone: Secondary | ICD-10-CM | POA: Insufficient documentation

## 2020-03-08 DIAGNOSIS — E1122 Type 2 diabetes mellitus with diabetic chronic kidney disease: Secondary | ICD-10-CM | POA: Diagnosis not present

## 2020-03-08 DIAGNOSIS — C7989 Secondary malignant neoplasm of other specified sites: Secondary | ICD-10-CM | POA: Insufficient documentation

## 2020-03-08 DIAGNOSIS — C50412 Malignant neoplasm of upper-outer quadrant of left female breast: Secondary | ICD-10-CM | POA: Insufficient documentation

## 2020-03-08 DIAGNOSIS — C7801 Secondary malignant neoplasm of right lung: Secondary | ICD-10-CM | POA: Diagnosis not present

## 2020-03-08 DIAGNOSIS — C773 Secondary and unspecified malignant neoplasm of axilla and upper limb lymph nodes: Secondary | ICD-10-CM | POA: Diagnosis not present

## 2020-03-08 DIAGNOSIS — J910 Malignant pleural effusion: Secondary | ICD-10-CM | POA: Insufficient documentation

## 2020-03-08 LAB — COMPREHENSIVE METABOLIC PANEL
ALT: 10 U/L (ref 0–44)
AST: 13 U/L — ABNORMAL LOW (ref 15–41)
Albumin: 2.7 g/dL — ABNORMAL LOW (ref 3.5–5.0)
Alkaline Phosphatase: 85 U/L (ref 38–126)
Anion gap: 8 (ref 5–15)
BUN: 25 mg/dL — ABNORMAL HIGH (ref 8–23)
CO2: 22 mmol/L (ref 22–32)
Calcium: 8.1 mg/dL — ABNORMAL LOW (ref 8.9–10.3)
Chloride: 110 mmol/L (ref 98–111)
Creatinine, Ser: 1.29 mg/dL — ABNORMAL HIGH (ref 0.44–1.00)
GFR calc Af Amer: 46 mL/min — ABNORMAL LOW (ref >60)
GFR calc non Af Amer: 40 mL/min — ABNORMAL LOW (ref >60)
Glucose, Bld: 254 mg/dL — ABNORMAL HIGH (ref 70–99)
Potassium: 4.4 mmol/L (ref 3.5–5.1)
Sodium: 140 mmol/L (ref 135–145)
Total Bilirubin: 0.4 mg/dL (ref 0.3–1.2)
Total Protein: 5.5 g/dL — ABNORMAL LOW (ref 6.5–8.1)

## 2020-03-08 LAB — CBC WITH DIFFERENTIAL/PLATELET
Abs Immature Granulocytes: 0.17 10*3/uL — ABNORMAL HIGH (ref 0.00–0.07)
Basophils Absolute: 0 10*3/uL (ref 0.0–0.1)
Basophils Relative: 0 %
Eosinophils Absolute: 0.1 10*3/uL (ref 0.0–0.5)
Eosinophils Relative: 1 %
HCT: 27.7 % — ABNORMAL LOW (ref 36.0–46.0)
Hemoglobin: 9.5 g/dL — ABNORMAL LOW (ref 12.0–15.0)
Immature Granulocytes: 2 %
Lymphocytes Relative: 9 %
Lymphs Abs: 0.9 10*3/uL (ref 0.7–4.0)
MCH: 30.4 pg (ref 26.0–34.0)
MCHC: 34.3 g/dL (ref 30.0–36.0)
MCV: 88.8 fL (ref 80.0–100.0)
Monocytes Absolute: 0.8 10*3/uL (ref 0.1–1.0)
Monocytes Relative: 9 %
Neutro Abs: 7.2 10*3/uL (ref 1.7–7.7)
Neutrophils Relative %: 79 %
Platelets: 190 10*3/uL (ref 150–400)
RBC: 3.12 MIL/uL — ABNORMAL LOW (ref 3.87–5.11)
RDW: 18.7 % — ABNORMAL HIGH (ref 11.5–15.5)
WBC: 9.2 10*3/uL (ref 4.0–10.5)
nRBC: 0 % (ref 0.0–0.2)

## 2020-03-08 MED ORDER — TRAMADOL HCL 50 MG PO TABS: ORAL_TABLET | ORAL | 0 refills | Status: DC

## 2020-03-08 NOTE — Telephone Encounter (Signed)
1.  Needs refill tramadol  2. Daughter in law [place] and her husband have questions they didn't want to ask in front of [facility].  Larene Brigitta Pricer [phone]

## 2020-03-08 NOTE — Telephone Encounter (Signed)
Called patient to do follow up TCM call from her discharge on 03/05/2020. Not able to reach patient. Left message to call office.

## 2020-03-09 ENCOUNTER — Other Ambulatory Visit: Payer: Self-pay | Admitting: Primary Care

## 2020-03-09 ENCOUNTER — Ambulatory Visit (INDEPENDENT_AMBULATORY_CARE_PROVIDER_SITE_OTHER): Payer: Medicare Other | Admitting: Family Medicine

## 2020-03-09 ENCOUNTER — Telehealth: Payer: Self-pay

## 2020-03-09 ENCOUNTER — Encounter: Payer: Self-pay | Admitting: Family Medicine

## 2020-03-09 ENCOUNTER — Telehealth: Payer: Self-pay | Admitting: Hematology

## 2020-03-09 ENCOUNTER — Inpatient Hospital Stay: Payer: Medicare Other

## 2020-03-09 VITALS — BP 118/62 | HR 75 | Temp 97.6°F | Ht 62.0 in | Wt 204.0 lb

## 2020-03-09 DIAGNOSIS — I152 Hypertension secondary to endocrine disorders: Secondary | ICD-10-CM

## 2020-03-09 DIAGNOSIS — J4540 Moderate persistent asthma, uncomplicated: Secondary | ICD-10-CM

## 2020-03-09 DIAGNOSIS — E113419 Type 2 diabetes mellitus with severe nonproliferative diabetic retinopathy with macular edema, unspecified eye: Secondary | ICD-10-CM

## 2020-03-09 DIAGNOSIS — I10 Essential (primary) hypertension: Secondary | ICD-10-CM

## 2020-03-09 DIAGNOSIS — N183 Chronic kidney disease, stage 3 unspecified: Secondary | ICD-10-CM

## 2020-03-09 DIAGNOSIS — C50919 Malignant neoplasm of unspecified site of unspecified female breast: Secondary | ICD-10-CM | POA: Diagnosis not present

## 2020-03-09 DIAGNOSIS — E1165 Type 2 diabetes mellitus with hyperglycemia: Secondary | ICD-10-CM

## 2020-03-09 DIAGNOSIS — J910 Malignant pleural effusion: Secondary | ICD-10-CM | POA: Diagnosis not present

## 2020-03-09 DIAGNOSIS — I5189 Other ill-defined heart diseases: Secondary | ICD-10-CM

## 2020-03-09 DIAGNOSIS — Z794 Long term (current) use of insulin: Secondary | ICD-10-CM

## 2020-03-09 DIAGNOSIS — R0602 Shortness of breath: Secondary | ICD-10-CM

## 2020-03-09 DIAGNOSIS — IMO0002 Reserved for concepts with insufficient information to code with codable children: Secondary | ICD-10-CM

## 2020-03-09 DIAGNOSIS — E1159 Type 2 diabetes mellitus with other circulatory complications: Secondary | ICD-10-CM | POA: Diagnosis not present

## 2020-03-09 LAB — CANCER ANTIGEN 27.29: CA 27.29: 31.3 U/mL (ref 0.0–38.6)

## 2020-03-09 NOTE — Assessment & Plan Note (Signed)
Most blood sugars have been under 150 since getting home-she is using 35 units of Lantus if sugar over 220 and this seems to help. Lab Results  Component Value Date   HGBA1C 7.2 (H) 03/02/2020  Could likely be more lenient on A1c as we transition to palliative goals for cancer treatment-I honestly think an A1c 8.5 regular 9 would be reasonable but I am also fine keeping her at current levels as long as no hypoglycemia.  Also has close follow-up with endocrinology

## 2020-03-09 NOTE — Assessment & Plan Note (Signed)
Likely multifactorial-combination of asthma exacerbation and malignant pleural effusion which has now been drained twice in the last 2 weeks-she does get significant relief after drainage.  She reports they are considering Pleurx catheter potentially.  She has close follow-up with oncology and pulmonology.  At the moment she seems to be doing better as long as on her oxygen-continue home oxygen and follow-up

## 2020-03-09 NOTE — Assessment & Plan Note (Signed)
Patient remains on Breo-seems like prior exacerbation has resolved.  Maintains on home oxygen likely more related to malignant pleural effusion

## 2020-03-09 NOTE — Assessment & Plan Note (Addendum)
Blood pressures controlled on amlodipine 10 mg, carvedilol 25 mg twice daily, clonidine 0.2 mg twice daily.  Lisinopril and Lasix held in the hospital and we will continue to hold at this time with blood pressure at 118/62.  Patient does have diastolic dysfunction and she may restart Lasix if swelling worsens-may also restart lisinopril if blood pressure increases.  We tentatively discussed a 1 month follow-up or I may simply follow along with her other visits and if blood pressure elevated may make adjustments  Patient with CKD stage III.Patient also had acute kidney injury in the hospital which seems to have resolved with creatinine down from peak 2.2-1.3 yesterday.  I think remaining off lisinopril and Lasix for now is reasonable.  Will not repeat blood work at this time.  She also had some anemia noted yesterday but denies rectal bleeding or dark black stool-we will monitor with next blood work.  At last visit we had kept her off spironolactone-she will also remain off that for now.

## 2020-03-09 NOTE — Assessment & Plan Note (Addendum)
Unfortunately patient's cancer did not respond well to last round of chemotherapy-she has had progression of disease.  Consideration of hospice-patient is leaning toward continuing treatment for now though and stopping only if she does not tolerate the treatment-she seems to have strong desire to prolong the length of days but we did discuss potential increased quality of life with transition to hospice.  She is going to continue to discuss things with family and update Dr. Burr Medico within the next week.  Palliative care team is also involved and sounds like they have some home visits arranged

## 2020-03-09 NOTE — Assessment & Plan Note (Signed)
As noted has been drained twice in the last 2 weeks with over 300 mL noted-consideration for Pleurx catheter but apparently does not currently meet criteria-patient will continue to monitor and follow-up with oncology and pulmonology

## 2020-03-09 NOTE — Telephone Encounter (Signed)
Noted thanks °

## 2020-03-09 NOTE — Telephone Encounter (Signed)
No los per 6/7.

## 2020-03-09 NOTE — Telephone Encounter (Signed)
2 boxes of 5 pens each Lantus arrived for patient via PAP.  Both boxes placed in refrigerator for pick up.  Pt notified.

## 2020-03-09 NOTE — Telephone Encounter (Signed)
Patient in office with daughter in law:   Transition Care Management Follow-up Telephone Call  Date of discharge and from where: 03/05/2020 [facility]    How have you been since you were released from the hospital? Feeling about the same.   Any questions or concerns? No   Items Reviewed:  Did the pt receive and understand the discharge instructions provided? Yes   Medications obtained and verified? Yes   Any new allergies since your discharge? Yes   Dietary orders reviewed? Yes  Do you have support at home? has open orders for home health and pallative care   Functional Questionnaire: (I = Independent and D = Dependent) ADLs: D   Bathing/Dressing- D  Meal Prep- D    Eating- I  Maintaining continence- I  Transferring/Ambulation- I uses walker in home   Managing Meds- I   Follow up appointments reviewed:   PCP Hospital f/u appt confirmed? Yes  Scheduled to see Hunter on 03/09/2020 @ 10:00.  [facility] f/u appt confirmed? Yes  Scheduled to see Oncology  on 03/08/2020   Are transportation arrangements needed? No  If their condition worsens, is the pt aware to call PCP or go to the Emergency Dept.? Yes  Was the patient provided with contact information for the PCP's office or ED? Yes  Was to pt encouraged to call back with questions or concerns? Yes

## 2020-03-10 ENCOUNTER — Telehealth: Payer: Self-pay

## 2020-03-10 NOTE — Telephone Encounter (Signed)
Telephone call to patients daughter [name] to schedule palliative care visit.  Daughter in agreement with palliative care home visit 03/17/20 at 9:30 AM.

## 2020-03-11 ENCOUNTER — Inpatient Hospital Stay: Payer: Medicare Other

## 2020-03-11 ENCOUNTER — Inpatient Hospital Stay: Payer: Medicare Other | Admitting: Hematology

## 2020-03-11 DIAGNOSIS — Z7951 Long term (current) use of inhaled steroids: Secondary | ICD-10-CM | POA: Diagnosis not present

## 2020-03-11 DIAGNOSIS — Z79891 Long term (current) use of opiate analgesic: Secondary | ICD-10-CM | POA: Diagnosis not present

## 2020-03-11 DIAGNOSIS — Z794 Long term (current) use of insulin: Secondary | ICD-10-CM | POA: Diagnosis not present

## 2020-03-11 DIAGNOSIS — K219 Gastro-esophageal reflux disease without esophagitis: Secondary | ICD-10-CM | POA: Diagnosis not present

## 2020-03-11 DIAGNOSIS — Z9181 History of falling: Secondary | ICD-10-CM | POA: Diagnosis not present

## 2020-03-11 DIAGNOSIS — R32 Unspecified urinary incontinence: Secondary | ICD-10-CM | POA: Diagnosis not present

## 2020-03-11 DIAGNOSIS — F419 Anxiety disorder, unspecified: Secondary | ICD-10-CM | POA: Diagnosis not present

## 2020-03-11 DIAGNOSIS — Z86010 Personal history of colonic polyps: Secondary | ICD-10-CM | POA: Diagnosis not present

## 2020-03-11 DIAGNOSIS — I129 Hypertensive chronic kidney disease with stage 1 through stage 4 chronic kidney disease, or unspecified chronic kidney disease: Secondary | ICD-10-CM | POA: Diagnosis not present

## 2020-03-11 DIAGNOSIS — E1122 Type 2 diabetes mellitus with diabetic chronic kidney disease: Secondary | ICD-10-CM | POA: Diagnosis not present

## 2020-03-11 DIAGNOSIS — J45901 Unspecified asthma with (acute) exacerbation: Secondary | ICD-10-CM | POA: Diagnosis not present

## 2020-03-11 DIAGNOSIS — F329 Major depressive disorder, single episode, unspecified: Secondary | ICD-10-CM | POA: Diagnosis not present

## 2020-03-11 DIAGNOSIS — C7951 Secondary malignant neoplasm of bone: Secondary | ICD-10-CM | POA: Diagnosis not present

## 2020-03-11 DIAGNOSIS — D631 Anemia in chronic kidney disease: Secondary | ICD-10-CM | POA: Diagnosis not present

## 2020-03-11 DIAGNOSIS — C7981 Secondary malignant neoplasm of breast: Secondary | ICD-10-CM | POA: Diagnosis not present

## 2020-03-11 DIAGNOSIS — E785 Hyperlipidemia, unspecified: Secondary | ICD-10-CM | POA: Diagnosis not present

## 2020-03-11 DIAGNOSIS — G8929 Other chronic pain: Secondary | ICD-10-CM | POA: Diagnosis not present

## 2020-03-11 DIAGNOSIS — N189 Chronic kidney disease, unspecified: Secondary | ICD-10-CM | POA: Diagnosis not present

## 2020-03-11 DIAGNOSIS — C787 Secondary malignant neoplasm of liver and intrahepatic bile duct: Secondary | ICD-10-CM | POA: Diagnosis not present

## 2020-03-12 ENCOUNTER — Telehealth: Payer: Self-pay

## 2020-03-12 NOTE — Telephone Encounter (Signed)
Darrel Reach called stating that Ms Panozzo is having increased swelling in her legs and face.  I returned Shannon's call and left a vm.  I then called Ms Belknap and let a vm to return my call.

## 2020-03-15 ENCOUNTER — Telehealth: Payer: Self-pay

## 2020-03-15 ENCOUNTER — Inpatient Hospital Stay (HOSPITAL_BASED_OUTPATIENT_CLINIC_OR_DEPARTMENT_OTHER): Payer: Medicare Other | Admitting: Hematology

## 2020-03-15 ENCOUNTER — Other Ambulatory Visit: Payer: Self-pay

## 2020-03-15 ENCOUNTER — Encounter: Payer: Self-pay | Admitting: Hematology

## 2020-03-15 ENCOUNTER — Inpatient Hospital Stay: Payer: Medicare Other

## 2020-03-15 VITALS — BP 118/61 | HR 70 | Temp 97.7°F | Resp 18 | Ht 62.0 in | Wt 200.3 lb

## 2020-03-15 DIAGNOSIS — N183 Chronic kidney disease, stage 3 unspecified: Secondary | ICD-10-CM

## 2020-03-15 DIAGNOSIS — Z170 Estrogen receptor positive status [ER+]: Secondary | ICD-10-CM

## 2020-03-15 DIAGNOSIS — C50412 Malignant neoplasm of upper-outer quadrant of left female breast: Secondary | ICD-10-CM

## 2020-03-15 LAB — COMPREHENSIVE METABOLIC PANEL
ALT: 9 U/L (ref 0–44)
AST: 16 U/L (ref 15–41)
Albumin: 2.9 g/dL — ABNORMAL LOW (ref 3.5–5.0)
Alkaline Phosphatase: 116 U/L (ref 38–126)
Anion gap: 11 (ref 5–15)
BUN: 16 mg/dL (ref 8–23)
CO2: 27 mmol/L (ref 22–32)
Calcium: 8.7 mg/dL — ABNORMAL LOW (ref 8.9–10.3)
Chloride: 101 mmol/L (ref 98–111)
Creatinine, Ser: 1.54 mg/dL — ABNORMAL HIGH (ref 0.44–1.00)
GFR calc Af Amer: 37 mL/min — ABNORMAL LOW (ref >60)
GFR calc non Af Amer: 32 mL/min — ABNORMAL LOW (ref >60)
Glucose, Bld: 110 mg/dL — ABNORMAL HIGH (ref 70–99)
Potassium: 4.1 mmol/L (ref 3.5–5.1)
Sodium: 139 mmol/L (ref 135–145)
Total Bilirubin: 0.4 mg/dL (ref 0.3–1.2)
Total Protein: 6.8 g/dL (ref 6.5–8.1)

## 2020-03-15 LAB — CBC WITH DIFFERENTIAL/PLATELET
Abs Immature Granulocytes: 0.21 10*3/uL — ABNORMAL HIGH (ref 0.00–0.07)
Basophils Absolute: 0 10*3/uL (ref 0.0–0.1)
Basophils Relative: 0 %
Eosinophils Absolute: 0.1 10*3/uL (ref 0.0–0.5)
Eosinophils Relative: 1 %
HCT: 30.3 % — ABNORMAL LOW (ref 36.0–46.0)
Hemoglobin: 10.4 g/dL — ABNORMAL LOW (ref 12.0–15.0)
Immature Granulocytes: 2 %
Lymphocytes Relative: 8 %
Lymphs Abs: 1 10*3/uL (ref 0.7–4.0)
MCH: 30.5 pg (ref 26.0–34.0)
MCHC: 34.3 g/dL (ref 30.0–36.0)
MCV: 88.9 fL (ref 80.0–100.0)
Monocytes Absolute: 0.7 10*3/uL (ref 0.1–1.0)
Monocytes Relative: 5 %
Neutro Abs: 10.6 10*3/uL — ABNORMAL HIGH (ref 1.7–7.7)
Neutrophils Relative %: 84 %
Platelets: 257 10*3/uL (ref 150–400)
RBC: 3.41 MIL/uL — ABNORMAL LOW (ref 3.87–5.11)
RDW: 17.6 % — ABNORMAL HIGH (ref 11.5–15.5)
WBC: 12.6 10*3/uL — ABNORMAL HIGH (ref 4.0–10.5)
nRBC: 0 % (ref 0.0–0.2)

## 2020-03-15 LAB — CYTOLOGY - NON PAP

## 2020-03-15 MED ORDER — CAPECITABINE 500 MG PO TABS
750.0000 mg/m2 | ORAL_TABLET | Freq: Two times a day (BID) | ORAL | 0 refills | Status: DC
Start: 2020-03-15 — End: 2020-03-16

## 2020-03-15 NOTE — Progress Notes (Signed)
[place]   [phone] [phone]   Clinic Follow up Note   Patient Care Team: Marin Olp, MD as PCP - General (Family Medicine) Philemon Kingdom, MD as Consulting Physician (Internal Medicine) Jalene Mullet, MD as Consulting Physician (Ophthalmology) Erroll Luna, MD as Consulting Physician (General Surgery) Kyung Rudd, MD as Consulting Physician (Radiation Oncology) [name], MD as Consulting Physician (Hematology) Delice Bison, Charlestine Massed, NP as Nurse Practitioner (Hematology and Oncology) Marshell Garfinkel, MD as Consulting Physician (Pulmonary Disease)  Date of Service:  03/15/2020  CHIEF COMPLAINT: Follow Up for Invasive Ductal Carcinoma of the left breast  SUMMARY OF ONCOLOGIC HISTORY: Oncology History Overview Note  Cancer Staging Malignant neoplasm of upper-outer quadrant of left breast in female, estrogen receptor positive ([place]) Staging form: Breast, AJCC 8th Edition - Clinical: Stage IIB (cT2, cN0, cM0, G3, ER: Positive, PR: Negative, HER2: Negative) - Signed by [name], MD on 11/23/2016 - Pathologic stage from 12/28/2016: Stage IIB (pT2, pN1a(sn), cM0, G3, ER: Positive, PR: Negative, HER2: Negative) - Signed by [name], MD on 01/02/2017     Malignant neoplasm of upper-outer quadrant of left breast in female, estrogen receptor positive ([place])  11/01/2016 Mammogram   Diagnostic mammogram and ultrasound of left breast and axilla showed a 3.1 x 2.1 x 1.4 cm (3.3 x 2.0 x 2.7 cm by ultrasound) mass in the upper outer quadrant of the anterior third of the left breast, associated with pleomorphic calcification. There is a 8 mm (1.8cm by [name]) prominent lymph node in the left axilla.   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   11/07/2016 Receptors her2   ER 80% positive, PR negative, HER-2 negative, Ki-67 90%   11/20/2016 Initial  Diagnosis   Malignant neoplasm of upper-outer quadrant of left breast in female, estrogen receptor positive ([place])   12/07/2016 Surgery   Left lumpectomy and left axillary sentinel lymph node sampling by Dr. Brantley Stage   12/07/2016 Pathology Results   pT2, pN1 Left Lumpectomy: Grade 3 IDC measuring 3.4 cm, carcinoma broadly present at the superior margin. Grade 3 DCIS. 1 out of 2 left axillary SLN positive for metastatic carcinoma   12/07/2016 Miscellaneous   Mammaprint showed high risk disease, basal type    12/28/2016 Surgery   Re-excision of the previously positive superior margin was negative for malignant cells.    01/04/2017 Surgery   Port inserted   01/18/2017 - 03/22/2017 Chemotherapy   Adjuvant Docetaxel 75 mg/m and Cytoxan 600 mg/m, every 21 days, for total of 4 cycles, with Neulasta on day 2.    04/23/2017 - 05/18/2017 Radiation Therapy   Radiation treatment dates:   04/23/17 - 05/18/17 Administered by Dr. Lisbeth Renshaw  Site/dose:    Left breast/ 42.5 Gy in 17 Fx Boost / 7.5 Gy in 3 Fx   06/12/2017 - 08/14/2019 Anti-estrogen oral therapy   Adjuvant letrozole 1 mg daily, plan for 5-7 years. D/c on 08/14/19 due to local recurrence of left axilla.     07/20/2017 Mammogram   IMPRESSION: No mammographic evidence of malignancy in either breast. 3.8 cm left breast postsurgical loculated seroma.   09/12/2017 Survivorship     09/19/2018 Imaging   Baseline DEXA 09/19/18 ASSESSMENT: The BMD measured at Femur Neck Left is 0.888 g/cm2 with a T-score of -1.1. This patient is considered osteopenic according to [facility]) criteria.   The scan quality is good. L-4  was excluded due to degenerative changes.   Site Region Measured Date Measured Age YA BMD Significant CHANGE T-score AP Spine  L1-L3      09/19/2018    77.2         -0.7    1.084 g/cm2   DualFemur Neck Left  09/19/2018    77.2         -1.1    0.888 g/cm2   DualFemur Total Mean 09/19/2018    77.2         -0.5     0.946 g/cm2 ASSESSMENT: The probability of a major osteoporotic fracture is 4.5 % within the next ten years.   The probability of hip fracture is 0.8  % within the next 10 years.   06/25/2019 Mammogram   Diagnostic mammogram 06/25/19  IMPRESSION: 1.  Two morphologically abnormal lymph nodes in the left axilla with a lymph node in the lower left axilla measuring 2.4 x 2 x 2.6 cm.   2. Stable lumpectomy changes left breast with no findings of malignancy in either breast.   07/01/2019 Pathology Results   Diagnosis Lymph node, needle/core biopsy, left axilla - POORLY DIFFERENTIATED CARCINOMA CONSISTENT WITH BREAST PRIMARY. - NO LYMPHOID NODAL TISSUE IDENTIFIED. - SEE MICROSCOPIC DESCRIPTION. Microscopic Comment The carcinoma is consistent with grade III.  PROGNOSTIC INDICATORS Results: IMMUNOHISTOCHEMICAL AND MORPHOMETRIC ANALYSIS PERFORMED MANUALLY The tumor cells are NEGATIVE for Her2 (1+). Estrogen Receptor: 80%, POSITIVE, WEAK STAINING INTENSITY Progesterone Receptor: 0%, NEGATIVE Proliferation Marker Ki67: 80%   07/18/2019 Breast MRI   Breast MRI 07/18/19  IMPRESSION: 1. Two morphologically abnormal level 1 left axillary lymph nodes correlating with biopsy-proven malignancy. 2. No MRI evidence of malignancy in either breast. 3. Postsurgical changes of the left breast consistent with prior lumpectomy.   07/25/2019 PET scan   PET 07/25/19  IMPRESSION: 1. Hypermetabolic LEFT axillary mass consistent with metastatic breast cancer. 2. High LEFT axillary/posterior triangle (level 5) lymph node consistent with local metastasis. 3. No central mediastinal nodal metastasis. No suspicious pulmonary nodules. 4. No distant soft tissue metastasis or skeletal metastasis. 5. Calcified fibroid uterus   08/05/2019 Surgery   LEFT AXILLARY LYMPH NODE DISSECTION by Dr. Brantley Stage 08/05/19    08/05/2019 Pathology Results   FINAL MICROSCOPIC DIAGNOSIS: 08/05/19    A. LYMPH NODES, LEFT  AXILLARY, DISSECTION:  - Metastatic carcinoma.  - See comment.    08/2019 - 01/13/2020 Anti-estrogen oral therapy   Exemestane 110m starting 08/2019. Stopped with the start of chemo in 01/2020   09/01/2019 Genetic Testing   [employer] Genomic Findings:  -RET amplification -CDK8 amplification -NOTCH1 deletion  -TGY17splice site [id number]>H  16/75/9163Imaging   CT Angio Chest   IMPRESSION: 1. No definitive pulmonary embolism. 2. Multiple new small bilateral pulmonary nodules consistent with metastatic disease. 3. Soft tissue density in the left axilla at the site of node dissection, probably representing scarring but I cannot exclude tumor recurrence. 4. New partially healed fracture of the posterior aspect of the right eighth rib.   12/04/2019 - 01/13/2020 Chemotherapy   Verzenio 15110mBID starting 12/04/19. Stopped a week before the start of chemo in 01/2020   01/07/2020 PET scan   IMPRESSION: 1. Marked worsening of disease with soft tissue recurrence in the left axilla, new and enlarging lymph nodes in the supraclavicular/high axillary region, new mediastinal adenopathy and new pulmonary pleural and parenchymal disease. 2. Metastatic disease to the right hepatic lobe. 3. Signs of bony metastasis. 4. New pleural effusions presumably malignant and  associated with pleural nodularity.   01/20/2020 - 02/23/2020 Chemotherapy   Abraxane on days 1, 8, 15 q28 days; started 01/20/20. Stopped after 02/23/20 due to disease progression.    02/24/2020 Procedure   Left thoracentesis  IMPRESSION: Successful ultrasound guided left thoracentesis yielding 320 ml of pleural fluid.    FINAL MICROSCOPIC DIAGNOSIS:  - Malignant cells consistent with metastatic adenocarcinoma     03/02/2020 Imaging   CT Angio Chest  IMPRESSION: No evidence of pulmonary emboli.   Progression of disease particularly within the lungs, hila and mediastinum consistent with the known history of breast carcinoma with  recurrence. Persistent left breast mass is noted with increasing left axillary and left chest wall lymphadenopathy.   Bilateral pleural effusions right greater than left.   Bony metastatic disease.   03/02/2020 Imaging   CT AP  IMPRESSION: 1. No acute abdominal/pelvic findings or lymphadenopathy. No findings for small bowel obstruction. 2. 3 cm segment 6 liver lesion consistent with metastatic disease. 3. Enlarged fibroid uterus with calcified fibroids. 4. Bilateral pleural effusions and numerous small metastatic pulmonary nodules.   Aortic Atherosclerosis (ICD10-I70.0).   03/02/2020 Procedure   Right Thoracentesis  IMPRESSION: Successful ultrasound guided right thoracentesis yielding 300 cc of pleural fluid.    FINAL MICROSCOPIC DIAGNOSIS:  - Malignant cells consistent with adenocarcinoma  - See comment  The tumor cells are EQUIVOCAL for Her2 (2+);  FISH is pending and will  be reported in an addendum   Estrogen Receptor: NEGATIVE  Progesterone Receptor: NEGATIVE  Proliferation Marker Ki-67: 60%     Chemotherapy   PENDING Xeloda 1547m BID 2 weeks on/1 week off       CURRENT THERAPY:  PENDING Xeloda 15057mBID 2 weeks on/1 week off  INTERVAL HISTORY:  Wendy WENDTs here for a follow up. She presents to the clinic with her family member including her son ChGerald StabsHe has been in town this week to help her at home and get ready for chemo. She notes currently she has been thinking she should take more chemo and does not want to give up on treatment yet. She feels if her cancer or condition gets worse she would then starts but feels good enough to proceed. She notes she still has her old Verzenio, some of which are unopened. She is fine to bring her unopened bottles to clinic. She notes her breathing again has decreased again. She lays down with her head propped. She tried to taper down to 2-3 L but started to Sat around 89% oxygen so she is currently on 4L/min. She notes her  main point of contact is DeMarta Antund his wife ShLarene Beachho are taking care of her.    REVIEW OF SYSTEMS:   Constitutional: Denies fevers, chills or abnormal weight loss Eyes: Denies blurriness of vision Ears, nose, mouth, throat, and face: Denies mucositis or sore throat Respiratory: Denies cough, dyspnea or wheezes (+) On Continuous nasal canula 4L  Cardiovascular: Denies palpitation, chest discomfort or lower extremity swelling Gastrointestinal:  Denies nausea, heartburn or change in bowel habits Skin: Denies abnormal skin rashes Lymphatics: Denies new lymphadenopathy or easy bruising Neurological:Denies numbness, tingling or new weaknesses Behavioral/Psych: Mood is stable, no new changes  All other systems were reviewed with the patient and are negative.  MEDICAL HISTORY:  Past Medical History:  Diagnosis Date  . Abnormal breast finding 2018   per pt/ having  a lot drainage from left breast nipple  . Anemia   . Anxiety   .  Breast cancer ([place]) 11/2016   left/  . CKD (chronic kidney disease)   . Depression   . GERD (gastroesophageal reflux disease)    TUMS as needed  . HTN (hypertension)    states BP has been high recently; has been on med. x 20 yr.  . Hyperlipidemia   . Hyperplastic colon polyp   . HYPERTENSION, BENIGN SYSTEMIC 11/29/2006   Lisinopril hctz 10-12.16m, amlodipine 13m coreg 258mID, clonidine 0.3 mg BID.   . IMarland Kitchensulin dependent diabetes mellitus   . Personal history of chemotherapy    2018/finished 6 weeks of chemo in Sep 2018  . Personal history of radiation therapy    2018 left breast/finished radiation in Sept 2018 per pt.    SURGICAL HISTORY: Past Surgical History:  Procedure Laterality Date  . AXILLARY LYMPH NODE DISSECTION Left 08/05/2019   Procedure: LEFT AXILLARY LYMPH NODE DISSECTION;  Surgeon: CorErroll LunaD;  [facility];  Laterality: Left;  . BREAST LUMPECTOMY Left 05/05/2008  . BREAST LUMPECTOMY  Left 12/07/2016   malignant  . BREAST LUMPECTOMY WITH RADIOACTIVE SEED AND SENTINEL LYMPH NODE BIOPSY Left 12/07/2016   Procedure: LEFT BREAST LUMPECTOMY WITH RADIOACTIVE SEED AND SENTINEL LYMPH NODE BIOPSY;  Surgeon: ThoErroll LunaD;  [facility];  Laterality: Left;  . IR FLUORO GUIDE PORT INSERTION RIGHT  01/04/2017  . IR THORACENTESIS ASP PLEURAL SPACE W/IMG GUIDE  03/02/2020  . IR US [place] VASC ACCESS RIGHT  01/04/2017  . PORT-A-CATH REMOVAL N/A 05/30/2017   Procedure: REMOVAL PORT-A-CATH;  Surgeon: CorErroll LunaD;  [facility] [place];  Laterality: N/A;  . RE-EXCISION OF BREAST LUMPECTOMY Left 12/28/2016   Procedure: RE-EXCISION OF BREAST LUMPECTOMY;  Surgeon: ThoErroll LunaD;  [facility] [place];  Laterality: Left;    I have reviewed the social history and family history with the patient and they are unchanged from previous note.  ALLERGIES:  has No Known Allergies.  MEDICATIONS:  Current Outpatient Medications  Medication Sig Dispense Refill  . albuterol (VENTOLIN HFA) 108 (90 Base) MCG/ACT inhaler Inhale 2 puffs into the lungs every 6 (six) hours as needed for wheezing or shortness of breath. 18 g 2  . amLODipine (NORVASC) 10 MG tablet TAKE 1 TABLET BY MOUTH EVERY DAY (Patient taking differently: Take 10 mg by mouth daily. ) 90 tablet 1  . B-D INS SYRINGE 0.5CC/31GX5/16 31G X 5/16" 0.5 ML MISC USE AS DIRECTED 4 TIMES A DAY 300 each 1  . busPIRone (BUSPAR) 10 MG tablet Take 1 tablet (10 mg total) by mouth 3 (three) times daily. 270 tablet 1  . capecitabine (XELODA) 500 MG tablet Take 3 tablets (1,500 mg total) by mouth 2 (two) times daily after a meal. 84 tablet 0  . carvedilol (COREG) 25 MG tablet Take 1 tablet (25 mg total) by mouth 2 (two) times daily with a meal. 180 tablet 3  . cloNIDine (CATAPRES) 0.3 MG tablet TAKE 1 TABLET (0.3 MG TOTAL) BY MOUTH 2 (TWO) TIMES DAILY. 180 tablet 1    . famotidine (PEPCID) 20 MG tablet TAKE 1 TABLET BY MOUTH EVERY DAY 90 tablet 1  . fluticasone (FLONASE) 50 MCG/ACT nasal spray Place 1 spray into both nostrils daily. 16 g 2  . fluticasone furoate-vilanterol (BREO ELLIPTA) 100-25 MCG/INH AEPB Inhale 1 puff into the lungs daily. 14 each 0  . HYDROcodone-acetaminophen (NORCO/VICODIN) 5-325 MG tablet Take 1 tablet by  mouth every 8 (eight) hours as needed for moderate pain. 15 tablet 0  . Insulin Glargine (LANTUS SOLOSTAR) 100 UNIT/ML Solostar Pen Inject 28 units into the skin in am (Patient taking differently: Inject 28 Units into the skin daily. If over 220 give 35U) 10 pen 3  . Insulin Pen Needle 31G X 5 MM MISC Use pen needles for insulin injection daily 100 each 3  . insulin regular (NOVOLIN R) 100 units/mL injection Inject 0.14-0.18 mLs (14-18 Units total) into the skin 3 (three) times daily before meals. Relion. Please provide insulin syringes needed (Patient taking differently: Inject 10-16 Units into the skin See admin instructions. 16 units in the morning and 10 units at night) 20 mL 11  . Insulin Syringe-Needle U-100 (B-D INS SYRINGE 0.5CC/30GX1/2") 30G X 1/2" 0.5 ML MISC USE AS DIRECTED 4 TIMES A DAY 300 each 3  . lidocaine-prilocaine (EMLA) cream Apply to affected area once (Patient not taking: Reported on 03/09/2020) 30 g 3  . lovastatin (MEVACOR) 40 MG tablet TAKE 1 TABLET BY MOUTH EVERYDAY AT BEDTIME (Patient taking differently: Take 40 mg by mouth at bedtime. ) 90 tablet 3  . mirtazapine (REMERON) 7.5 MG tablet Take 1 tablet (7.5 mg total) by mouth at bedtime. 30 tablet 0  . montelukast (SINGULAIR) 10 MG tablet Take 1 tablet (10 mg total) by mouth at bedtime. 30 tablet 3  . ondansetron (ZOFRAN) 8 MG tablet Take 1 tablet (8 mg total) by mouth 2 (two) times daily as needed (Nausea or vomiting). 30 tablet 1  . polyethylene glycol powder (GLYCOLAX/MIRALAX) 17 GM/SCOOP powder Take 17 g by mouth daily. (Patient taking differently: Take 17 g by  mouth as needed for mild constipation or moderate constipation. ) 1 g 0  . traMADol (ULTRAM) 50 MG tablet TAKE 1 TABLET BY MOUTH EVERY 6 HOURS AS NEEDED FOR MODERATE/SEVERE PAIN (NECK AND SHOULDER PAIN). 30 tablet 0   No current facility-administered medications for this visit.    PHYSICAL EXAMINATION: ECOG PERFORMANCE STATUS: 2 - Symptomatic, <50% confined to bed  Vitals:   03/15/20 1532  BP: 118/61  Pulse: 70  Resp: 18  Temp: 97.7 F (36.5 C)  SpO2: 100%   Filed Weights   03/15/20 1532  Weight: 200 lb 4.8 oz (90.9 kg)    GENERAL:alert, no distress and comfortable SKIN: skin color, texture, turgor are normal, no rashes or significant lesions EYES: normal, Conjunctiva are pink and non-injected, sclera clear  NECK: supple, thyroid normal size, non-tender, without nodularity LYMPH:  no palpable lymphadenopathy in the cervical, axillary  LUNGS: clear to auscultation and percussion (+) B/l pleural effusion, Mainly of 2/3 of right lung HEART: regular rate & rhythm and no murmurs and no lower extremity edema ABDOMEN:abdomen soft, non-tender and normal bowel sounds Musculoskeletal:no cyanosis of digits and no clubbing  NEURO: alert & oriented x 3 with fluent speech, no focal motor/sensory deficits  LABORATORY DATA:  I have reviewed the data as listed CBC Latest Ref Rng & Units 03/15/2020 03/08/2020 03/01/2020  WBC 4.0 - 10.5 K/uL 12.6(H) 9.2 7.3  Hemoglobin 12.0 - 15.0 g/dL 10.4(L) 9.5(L) 12.6  Hematocrit 36 - 46 % 30.3(L) 27.7(L) 37.9  Platelets 150 - 400 K/uL 257 190 371     CMP Latest Ref Rng & Units 03/15/2020 03/08/2020 03/05/2020  Glucose 70 - 99 mg/dL 110(H) 254(H) 301(H)  BUN 8 - 23 mg/dL 16 25(H) 54(H)  Creatinine 0.44 - 1.00 mg/dL 1.54(H) 1.29(H) 1.96(H)  Sodium 135 - 145 mmol/L 139 140  137  Potassium 3.5 - 5.1 mmol/L 4.1 4.4 5.0  Chloride 98 - 111 mmol/L 101 110 111  CO2 22 - 32 mmol/L 27 22 19(L)  Calcium 8.9 - 10.3 mg/dL 8.7(L) 8.1(L) 7.9(L)  Total Protein 6.5 - 8.1  g/dL 6.8 5.5(L) -  Total Bilirubin 0.3 - 1.2 mg/dL 0.4 0.4 -  Alkaline Phos 38 - 126 U/L 116 85 -  AST 15 - 41 U/L 16 13(L) -  ALT 0 - 44 U/L 9 10 -      RADIOGRAPHIC STUDIES: I have personally reviewed the radiological images as listed and agreed with the findings in the report. No results found.   ASSESSMENT & PLAN:  NIHAL DOAN is a 80 y.o. female with    1. Malignant neoplasm of upper-outer quadrant of left breast, Invasive Ductal Carcinoma, pT2pN1M0, stage IIB, ER+/PR-/HER2-, G3, mammaprint high risk, axillarynodesrecurrence in 06/2019, lung nodules in 10/2019, metastatic to lung, bone, LNs, liver in 01/2020 -She was initiallydiagnosed in 11/2016. She was treated with left lumpectomy and SLN sampling, re-excision surgery, Adjuvant TC for 4 cycles andadjuvantradiation. She was on adjuvant Letrozole from 06/2017-08/14/19 -Unfortunately she had local recurrence in left axillaandunderwent Left axillary lymph node dissection by Dr. Brantley Stage on 08/05/19.She had complete resection. -I recommendedshe continue Exemestane and addedoral biological agent Verzenio 18m BID starting3/4/21. -HerPETimagesfrom 4/7/21showedwide spread disease with soft tissue recurrence in the left axilla, enlarging LNs in chest, new lung pleural and parenchymal disease, metastatic disease of the right liver lobe and signs of bone metastasis. There is also new pleural effusions presumably malignant and associated with pleural nodularity.Based on the scan findings,I do not think biopsy is necessary. At this stage her cancer is no longer curable but still treatable.  -Due to the rapid disease progression through AI and CDK4/6 inhibitor,and multiple visceral organ involvement, Istarted her on systemic chemo withsingle agent Abraxane844mm2 weekly,3 weeks on/1 week offon 01/20/20.  It was recently stopped due to disease progression. -HerFO results showedRET, CDK8, NOTCH1, TP53 mutations.Notargetable  mutation for now. -Her 03/02/20 CT CAP unfortunately showed disease progression.Clinically she also developed worsening bilateral pleural effusion. She had b/l thoracentesis and both cytologies were positive for Malignant cells consistent with adenocarcinoma. She has ER/PR/HER2 negative markers on recent cytology with equivocal HER2 on IHC and FISH is still pending. -We discussed chemotherapy, vs palliative and hospice care last week.  After she discussed with her family, especially with her son ChGerald Stabsrom Te[place] Hampshireshe has decided to try chemo again. -I reviewed option of second line chemo such as oral chemo Xeloda 150075mID (dose adjust due to her CKD) 2 weeks on/1 week off, or Intravenous weekly Gemcitabine 2 weeks on/1 week off with or without carbo or weekly [place] 2 weeks on/1 week off etc.  -After discussion with her family she notes she is interested in more treatment. She notes if her condition from chemo or from treatment gets significantly worse, she would be willing to stop treatment.  -I discussed based on her condition I recommended Oral Xeloda.    -Chemotherapy consent: Side effects including but does not limited to, fatigue, nausea, vomiting, diarrhea, hair loss, neuropathy, fluid retention, renal and kidney dysfunction, neutropenic fever, needed for blood transfusion, bleeding, were discussed with patient in great detail. She agrees to proceed. -She understands the goal of therapy is predicated, to prolong her life and improve her quality of life. -Given her cancer is now more consistent with triple negative disease I will do more molecular testing to see if  she is eligible for immunotherapy such as Tecentriq for PD-L1 (+) markers.  -Labs reviewed, WBC 12.6, HG 10.4, ANC 10.6, BG 110, Cr 1.54, Ca 8.7, Albumin 2.9. I encouraged her to increase protein intake.  -I answered all their questions to their understanding and satisfaction.  -F/u in 2 weeks after start of Xeloda.     2.Dyspneaon exertion,Asthma, lung metastasis, bilateral malignant pleural effusion   -She has been having Dry Cough and SOB for the past few months and has progressed recently. -Patient denies smoking before but notes she has h/o of Tobacco snuffand h/o asthma. -She has receivedboth herCOVID19 vaccinations. -She is being followed by PCP andpulmonologist Dr. Stephan Minister breathing approved on [place] given by her PCP. -Her recent VQ scan was negative for PE -In the past 2 weeks she has had b/l thoracentesis. Bothprocedure had onlyabout 300 cc fluidsremoved and both cytologies were positive for Malignant cells consistent with adenocarcinoma.  -She does not h/o of tabacco use with dip and snuff, but no smoking.  -Because her pleural effusion is related to her cancer this can become recurrent. Will consider pleurx placement, if she has frequent Thoracentesis.  -Since hospital discharge she has been on continuous canula oxygen 4L/min during the day and 5L/min at night. She would have 97-98 Pulse Ox. I discussed her pulse Ox is fine at 92%, so she can reduce Oxygen to 2-4L/min.  -She notes her breathing has been slightly more difficult this week. She tried to taper down oxygen but could not reduce past 4L, which she is on now. Her lung exam today (03/15/20) indicates pleural effusion recurrence of 2/3 right lung. I will set up Thoracentesis for this week. She is agreeable. -She will continue to monitor her Oxygen Sat at home.    3.Low appetite, nausea, B/l abdominal pain, Diarrhea/constipation -She notes she has nausea which is controlled with antiemetics -She has lowered appetite from her bloating, early satiety, along with occasional b/l abdominal pain and occasional diarrhea/constipation.  -She has metamucil and prune juice as needed which helps her bowels.  -I encouraged her to use her Glucerna to help her maintain weight.I started her onMirtazapine 7.42m for her to take nightly  (01/08/20). If not enough we can titrate up or I can call in Marinol. I recommend she hold Trazodone for now given Mirtazapine can cause drowsiness. -For her pain I started her on Tramadol (03/08/20). If her pain progresses or not controlling her pain, I will call in Oxycodone.    4. Left arm numbness/tinglingandLeft arm lymphedema, secondary to surgery. -secondary to malignant left axillary adenopathy, s/p ALND in 08/2019 -She still has left armand handlymphedema. I discussed her edema can improve but may have residual edema permanently. -She is currently doing PT 3 times a week, continue.   5. HTN, DM, CKD III -F/u with PCPand endocrinologist.  -DM is not well controlled. I encouraged her to better control her DM as this can effects her kidney function.   6. Mild anemia -Iron and TIBC, B12, MMA normal -Anemia got worse since she started chemotherapy, and required blood transfusion  7. Osteopenia  -09/2018 DEXA shows osteopeniawith lowest T-score -1.1 -Will monitor onAI. Next DEXA in 09/2020  8. Financialand SocialSupport  -She gets help with her son but he is looking to return to working. [facility].  -Per request, I will see if she is eligible for home aid.   9. Goal of care discussion, DNR/DNI  -We  reviewed that due to her rapid disease progression, her cancer is not curable at this point. The goal is palliative, to prolong her life and improve quality of life. She understands and agrees. -The patient understands the goal of care is palliative. -I recommend DNR/DNI, she agreed to DNR/DNI.  -Given her aggressive cancer and decreased performance status, she will think about hospice care.    Plan: -I called in Xeloda 1549m BID 2 weeks [place] I will see if pharmacy has any samples for her to start sooner.  -Right Thoracentesis this week  -Request PD-L1 testing    -F/u in 2 weeks after she starts Xeloda    No problem-specific Assessment & Plan notes found for this encounter.   No orders of the defined types were placed in this encounter.  All questions were answered. The patient knows to call the clinic with any problems, questions or concerns. No barriers to learning was detected. The total time spent in the appointment was 45 minutes.     YTruitt Merle MD 03/15/2020   I, AJoslyn Devon am acting as scribe for YTruitt Merle MD.   I have reviewed the above documentation for accuracy and completeness, and I agree with the above.

## 2020-03-15 NOTE — Telephone Encounter (Signed)
Ms Grandville Silos called stating that Ms Saintil has decided to continue with treatment.  I told her I left her a vm regarding Ms Etcheverry edema.  She stated she has been working.  She states that Ms forgy has restarted lasix and the edema has gone down.  I called Ms Balthazor to verify she does want to continue with treatment and she verified she does.

## 2020-03-16 ENCOUNTER — Telehealth: Payer: Self-pay

## 2020-03-16 ENCOUNTER — Telehealth: Payer: Self-pay | Admitting: Hematology

## 2020-03-16 ENCOUNTER — Telehealth: Payer: Self-pay | Admitting: Pharmacist

## 2020-03-16 ENCOUNTER — Other Ambulatory Visit: Payer: Self-pay

## 2020-03-16 DIAGNOSIS — R0602 Shortness of breath: Secondary | ICD-10-CM

## 2020-03-16 DIAGNOSIS — C50919 Malignant neoplasm of unspecified site of unspecified female breast: Secondary | ICD-10-CM

## 2020-03-16 DIAGNOSIS — J90 Pleural effusion, not elsewhere classified: Secondary | ICD-10-CM

## 2020-03-16 DIAGNOSIS — Z170 Estrogen receptor positive status [ER+]: Secondary | ICD-10-CM

## 2020-03-16 MED ORDER — XELODA 500 MG PO TABS: 1500.0000 mg | ORAL_TABLET | Freq: Two times a day (BID) | ORAL | 0 refills | Status: DC

## 2020-03-16 NOTE — Telephone Encounter (Signed)
No los per 6/14.

## 2020-03-16 NOTE — Telephone Encounter (Signed)
Oral Oncology Pharmacist Encounter  Received new prescription for Xeloda (capecitabine) for the treatment of metastatic breast cancer, planned duration until disease progression or unacceptable drug toxicity.  CMP from 03/15/20 assessed, SCr 1.54, CrCl ~49mL/min. MD plans to continue to monitor renal function. Prescription dose and frequency assessed.   Current medication list in Epic reviewed, no DDIs with capecitabine identified.  Prescription has been e-scribed to the [facility] for benefits analysis and approval.  Oral Oncology Clinic will continue to follow for insurance authorization, copayment issues, initial counseling and start date.  Darl Pikes, PharmD, BCPS, BCOP, CPP Hematology/Oncology Clinical Pharmacist Practitioner [facility] [phone]  03/16/2020 11:42 AM

## 2020-03-16 NOTE — Telephone Encounter (Signed)
Oral Oncology Patient Advocate Encounter  After completing a benefits investigation, prior authorization for Xeloda is not required at this time through Medicare B.  Patient's copay is $47.66.    [place] [facility] [place] Phone [phone] Fax [phone] 03/16/2020 8:12 AM

## 2020-03-16 NOTE — Telephone Encounter (Signed)
I spoke with Wendy [name] and reviewed appt date and times for thoracentesis at [facility] Thursday  6/17 arrive at [place].  Covid test [facility] Wednesday 6/16 at 1130.  Wendy [name] verbalized understanding.  I left a vm for Wendy [name] with these appts as well.

## 2020-03-17 ENCOUNTER — Inpatient Hospital Stay (HOSPITAL_BASED_OUTPATIENT_CLINIC_OR_DEPARTMENT_OTHER): Payer: Medicare Other | Admitting: Medical

## 2020-03-17 ENCOUNTER — Other Ambulatory Visit: Payer: Self-pay

## 2020-03-17 ENCOUNTER — Other Ambulatory Visit: Payer: Medicare Other

## 2020-03-17 VITALS — BP 154/74 | HR 79 | Temp 98.9°F | Resp 18 | Ht 62.0 in | Wt 201.2 lb

## 2020-03-17 DIAGNOSIS — C50412 Malignant neoplasm of upper-outer quadrant of left female breast: Secondary | ICD-10-CM | POA: Diagnosis not present

## 2020-03-17 DIAGNOSIS — R0602 Shortness of breath: Secondary | ICD-10-CM

## 2020-03-17 LAB — SARS CORONAVIRUS 2 (TAT 6-24 HRS): SARS Coronavirus 2: NEGATIVE

## 2020-03-17 MED FILL — XELODA 500 MG TABLET: 500 | 21 days supply | Qty: 84 | Fill #0

## 2020-03-17 NOTE — Telephone Encounter (Addendum)
Oral Chemotherapy Pharmacist Encounter  Spoke with patient's daughter-in-law [name]. She plans on picking up the Xeloda from [place] today.  Patient Education I spoke with [name] for overview of new oral chemotherapy medication: Xeloda (capecitabine) for the treatment of metastatic breast cancer, planned duration until disease progression or unacceptable drug toxicity.   Counseled Shannon on administration, dosing, side effects, monitoring, drug-food interactions, safe handling, storage, and disposal. Patient will take 3 tablets (1,500 mg total) by mouth 2 (two) times daily after a meal. Take for 14 days, then hold for 7 days. Repeat every 21 days.  Side effects include but not limited to: diarrhea, hand-foot syndrome, edema, decreased wbc, fatigue, N/V.    Reviewed with [name] importance of keeping a medication schedule and plan for any missed doses. Medication handout placed in the mail.  [name] voiced understanding and appreciation. All questions answered. Medication handout placed in the mail.  Provided patient with Oral [facility] phone number. Patient knows to call the office with questions or concerns. Oral Chemotherapy Navigation Clinic will continue to follow.  Darl Pikes, PharmD, BCPS, BCOP, CPP Hematology/Oncology Clinical Pharmacist Practitioner [facility] [place] [phone]  03/17/2020 11:50 AM

## 2020-03-17 NOTE — Progress Notes (Unsigned)
PATIENT NAME: Wendy [name] DOB: 1940-10-08 MRN: [id number]  PRIMARY CARE PROVIDER: Marin Olp, MD  RESPONSIBLE PARTY:  Acct ID - Guarantor Home Phone Work Phone Relationship Acct Type  [id number] JAIYANA, CANALE [phone]  Self P/F     [address], [place], [place] [address]    PLAN OF CARE and INTERVENTIONS:               1.  GOALS OF CARE/ ADVANCE CARE PLANNING:  Patient wants to ger better and spend time with her children and grandchildren.                2.  PATIENT/CAREGIVER EDUCATION:  Education on fall precautions, education on s/s of infection, education on palliative care services, reviewed meds, support               3.  DISEASE STATUS: [place] and and RN made scheduled palliative care home visit. Palliative care team met with patient her son Marta Antu and daughter-in-law [name]. Patient sitting on sofa with O2 in place at 4 liters per minute. Patient is visibly short of breath. Patient scheduled to have thoracentesis tomorrow. Patient abdomen is swollen and patient appears very breathless. Patient states she has had fluid removed twice before. Daughter-in-law [name] is a Marine scientist. 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 Patient denies having pain at the present time. Patient reports she does have pain in her back at times. Patient has Tramadol in the home and reports Tramadol does help with pain. Patient is 5'2 and current weight 204 lbs. Patient states when she rests or does not talk shortness of breath is not as bad. Son reports patient stayed in bed most of the day yesterday. Patient reports her goal is to get better and be able to be  with her children and grandchildren. Patients breath sounds are diminished throughout. Patients last hospitalization was in May due to uncontrolled diabetes and asthma flare-up. Patients bowels have not moved in over a week. Education to patient to take Miralax that she has in the home. Patient reports her appetite is fair. Patient denies suffering any recent falls. Patient has lymphedema in her left arm. Patients B/P 162/64, tther vital signs are stable. Patient reports she has been sleeping well and family reports patient sleeps throughout the day. Patient is not currently taking lisinopril due to kidney function per daughter-in-law. Patient scheduled to have rapid covid test today. Palliative care services explained and patient and family in agreement. Patient is on oral chemo drug to treat cancer. Nurse reviewed patients medications with patient and family. Family encouraged to contact palliative care with questions or concerns.    HISTORY OF PRESENT ILLNESS:  Patient is a 79 year old female who resides in home with her son and daughter in law.  Patient in agreement with palliative care services.  Patient will be seen monthly and PRN.     CODE STATUS: No Code (Sw to obtain DNR)  ADVANCED DIRECTIVES: No MOST FORM: No PPS: 40%   PHYSICAL EXAM:   VITALS: Today's Vitals   03/17/20 0955  Weight: 200 lb (90.7 kg)  Height: 5' 2"  (1.575 m)    LUNGS: decreased breath sounds CARDIAC: Cor RRR  EXTREMITIES: Trace edema SKIN: Skin color, texture, turgor normal. No rashes or lesions  NEURO: positive for gait problems and weakness       Nilda Simmer, RN

## 2020-03-17 NOTE — Patient Instructions (Signed)
Thoracentesis ° °A thoracentesis is a procedure to remove excess fluid that has built up in the space between the linings of the chest wall and the lungs (pleural space). It is normal to have a small amount of fluid in the pleural space. Some medical conditions, such as heart failure, pneumonia, kidney problems, or cancer, can create too much fluid. This extra fluid is removed using a needle that is inserted through the skin and tissue and into the pleural space. °A thoracentesis may be done to: °· Understand why there is extra fluid in the pleural space and to create a treatment plan that is right for you. °· Help get rid of shortness of breath, discomfort, or pain that is caused by the extra fluid. °Tell a health care provider about: °· Any allergies you have. °· All medicines you are taking, including vitamins, herbs, eye drops, creams, and over-the-counter medicines. This includes any use of steroids, either by mouth or as a cream. °· Any problems you or family members have had with anesthetic medicines. °· Any blood disorders you have, including any history of blood clots. °· Any surgeries you have had. °· Medical conditions you have, including a frequent cough or coughing episodes. °· Whether you are pregnant or may be pregnant. °What are the risks? °Generally, this is a safe procedure. However, problems may occur, including: °· Infection. °· Bleeding. °· Injury to the lung. °· Injury to surrounding tissues or organs. °· Collapse of the lung. °What happens before the procedure? °· Follow instructions from your health care provider about hydration, which may include: °? Up to 2 hours before the procedure - you may continue to drink clear liquids, such as water, clear fruit juice, black coffee, and plain tea. °Eating and drinking restrictions °Follow instructions from your health care provider about eating and drinking, which may include: °· 8 hours before the procedure - stop eating heavy meals or foods such as  meat, fried foods, or fatty foods. °· 6 hours before the procedure - stop eating light meals or foods, such as toast or cereal. °· 6 hours before the procedure - stop drinking milk or drinks that contain milk. °· 2 hours before the procedure - stop drinking clear liquids. °Medicines °Ask your health care provider about: °· Changing or stopping your regular medicines. This is especially important if you are taking diabetes medicines or blood thinners. °· Taking medicines such as aspirin and ibuprofen. These medicines can thin your blood. Do not take these medicines unless your health care provider tells you to take them. °· Taking over-the-counter medicines, vitamins, herbs, and supplements. °· Taking a cough suppressant if you have a frequent cough or coughing episodes. °General instructions °· You may have a chest X-ray or another imaging test, such as a CT scan or ultrasound, to determine the location and amount of fluid in your pleural space. °· Plan to have someone take you home from the hospital or clinic. °· Plan to have a responsible adult care for you for at least 24 hours after you leave the hospital or clinic. This is important. °· Ask your health care provider what steps will be taken to help prevent infection. These may include: °? Removing hair at the needle-insertion site. °? Washing skin with a germ-killing soap. °What happens during the procedure? °· You will be asked to sit upright and lean slightly forward for the procedure. °· An IV will be inserted into one of your veins. °· You will be given one   or both of the following: °? A medicine to help you relax (sedative). °? A medicine to numb the area (local anesthetic). °· The health care provider will insert a needle into your back so that it goes between the ribs and into the pleural space. You may feel pressure or slight pain as the needle is positioned into the pleural space. °· Fluid will be removed from the pleural space through the needle. You  may feel pressure as the fluid is removed. °· The health care provider will take the needle out after the excess fluid has been removed. A sample of the fluid may be sent to the lab for testing. °· The needle insertion site (puncture site) will be covered with a bandage (dressing). °The procedure may vary among health care providers and hospitals. °What happens after the procedure? °· Your blood pressure, heart rate, breathing rate, and blood oxygen level will be monitored until you leave the hospital or clinic. °· A chest X-ray may be done to check the amount of fluid that remains in your pleural space. °· If a sample of fluid was sent for testing, ask your health care provider, or the department that did the procedure, when your results will be ready. It is up to you to get your test results. °· Do not drive for 24 hours if you were given a sedative during your procedure. °Summary °· A thoracentesis is a procedure to remove excess fluid that has built up in the space between the linings of the chest wall and the lungs (pleural space). °· Some medical conditions, such as heart failure, pneumonia, kidney problems, or cancer, can create too much fluid. °· For the procedure, a needle will be inserted between your ribs and into the pleural space. Fluid will be removed from the pleural space through the needle. The fluid may be sent to a lab for testing. °· After the procedure, you may have a chest X-ray to check the amount of fluid still in your pleural space. °This information is not intended to replace advice given to you by your health care provider. Make sure you discuss any questions you have with your health care provider. °Document Revised: 08/31/2017 Document Reviewed: 08/13/2017 °Elsevier Patient Education © 2020 [employer] ° ° °

## 2020-03-17 NOTE — Progress Notes (Signed)
The patient was seen for COVID-19 testing prior to a planned thoracentesis which is scheduled for tomorrow.  Sandi Mealy, MHS, PA-C Physician Assistant

## 2020-03-18 ENCOUNTER — Telehealth: Payer: Self-pay | Admitting: Nurse Practitioner

## 2020-03-18 ENCOUNTER — Telehealth: Payer: Self-pay

## 2020-03-18 ENCOUNTER — Inpatient Hospital Stay (HOSPITAL_BASED_OUTPATIENT_CLINIC_OR_DEPARTMENT_OTHER): Payer: Medicare Other | Admitting: Medical

## 2020-03-18 ENCOUNTER — Ambulatory Visit (HOSPITAL_COMMUNITY)
Admission: RE | Admit: 2020-03-18 | Discharge: 2020-03-18 | Disposition: A | Discharge status: Home / Self Care | Payer: Medicare Other | Source: Ambulatory Visit | Attending: Radiology | Admitting: Radiology

## 2020-03-18 ENCOUNTER — Ambulatory Visit (HOSPITAL_COMMUNITY)
Admission: RE | Admit: 2020-03-18 | Discharge: 2020-03-18 | Disposition: A | Discharge status: Home / Self Care | Payer: Medicare Other | Source: Ambulatory Visit | Attending: Hematology | Admitting: Hematology

## 2020-03-18 VITALS — BP 119/53 | HR 74 | Temp 97.5°F | Resp 17 | Ht 62.0 in | Wt 200.4 lb

## 2020-03-18 DIAGNOSIS — C50919 Malignant neoplasm of unspecified site of unspecified female breast: Secondary | ICD-10-CM | POA: Insufficient documentation

## 2020-03-18 DIAGNOSIS — R0602 Shortness of breath: Secondary | ICD-10-CM | POA: Diagnosis not present

## 2020-03-18 DIAGNOSIS — C50412 Malignant neoplasm of upper-outer quadrant of left female breast: Secondary | ICD-10-CM | POA: Diagnosis not present

## 2020-03-18 DIAGNOSIS — J189 Pneumonia, unspecified organism: Secondary | ICD-10-CM | POA: Diagnosis not present

## 2020-03-18 DIAGNOSIS — J90 Pleural effusion, not elsewhere classified: Secondary | ICD-10-CM

## 2020-03-18 MED ORDER — LIDOCAINE HCL 1 % IJ SOLN
INTRAMUSCULAR | Status: AC
  Filled 2020-03-18: qty 20

## 2020-03-18 MED ORDER — LEVOFLOXACIN 500 MG PO TABS: 500.0000 mg | ORAL_TABLET | Freq: Every day | ORAL | 0 refills | Status: DC

## 2020-03-18 MED ORDER — SODIUM CHLORIDE 0.9 % IV SOLN
Freq: Once | INTRAVENOUS | Status: AC
  Filled 2020-03-18: qty 250

## 2020-03-18 MED ORDER — LEVOFLOXACIN IN D5W 500 MG/100ML IV SOLN
500.0000 mg | INTRAVENOUS | Status: DC
  Administered 2020-03-18: 500 mg via INTRAVENOUS
  Filled 2020-03-18: qty 100

## 2020-03-18 NOTE — Procedures (Addendum)
Ultrasound-guided  therapeutic right thoracentesis performed yielding 400 cc of slightly hazy, yellow fluid. No immediate complications. EBL none. Dr. Burr Medico notified of pt's f/u CXR results concerning for pneumonia. No ptx.

## 2020-03-18 NOTE — Progress Notes (Signed)
Pt received IV levaquin today, tolerated well.  Able to eat/drink during tx w/out any issues, declined to use restroom.  Pt taking [facility] O2 tank home as her oxygen tank ran out during her visit.  Pt's son took home O2 tank home with him, pt using [facility] O2 tank until she can get to her home supply.  Pt aware to return tank to [facility] during her next appt.

## 2020-03-18 NOTE — Progress Notes (Signed)
[facility] Progress Note   Wendy [name] [id number] Nov 13, 1940 79 y.o.  Wendy [name] is managed by Dr. [name]  Actively treated with chemotherapy/immunotherapy/hormonal therapy: yes  Current therapy: Xeloda  Next scheduled appointment with provider: 04/07/2020  Assessment: Plan:    Pneumonia of both lungs due to infectious organism, unspecified part of lung - Plan: levofloxacin (LEVAQUIN) IVPB 500 mg, levofloxacin (LEVAQUIN) 500 MG tablet, 0.9 %  sodium chloride infusion  Metastatic breast cancer ([name])   Bilateral pneumonia: Patient was given Levaquin 500 mg IV x1 and was given a prescription for Levaquin 500 mg once daily x6 days.  Metastatic breast cancer: To be managed by Dr. Burr Medico and is currently treated with Xeloda 1500 mg p.o. twice daily.  She has an appointment to be seen next on 04/2020.  Please see After Visit Summary for patient specific instructions.  Future Appointments  Date Time Provider [place]  03/23/2020 12:00 PM [place] [facility] None  04/07/2020 10:45 AM [facility] LAB 1 [facility] None  04/07/2020 11:15 AM Alla Feeling, NP [facility] None  05/31/2020  2:45 PM Mannam, Praveen, MD [facility] None    No orders of the defined types were placed in this encounter.      Subjective:   Patient ID:  Wendy [name] is a 79 y.o. (DOB July 11, 1941) female.  Chief Complaint:  Chief Complaint  Patient presents with  . Pneumonia    HPI Wendy [name]  is a 79 y.o. female with a diagnosis of right breast cancer. She is managed by Dr. Burr Medico.  She presents to the clinic today status post thoracentesis earlier today.  She had 400 mL of fluid drained from her right lung.  A chest x-ray following her thoracentesis showed a bilateral multi lobar pneumonia.  As fevers, chills, sweats, or a productive cough.  Medications: I have reviewed the patient's current medications.  Allergies: No Known Allergies  Past Medical  History:  Diagnosis Date  . Abnormal breast finding 2018   per pt/ having  a lot drainage from left breast nipple  . Anemia   . Anxiety   . Breast cancer ([place]) 11/2016   left/  . CKD (chronic kidney disease)   . Depression   . GERD (gastroesophageal reflux disease)    TUMS as needed  . HTN (hypertension)    states BP has been high recently; has been on med. x 20 yr.  . Hyperlipidemia   . Hyperplastic colon polyp   . HYPERTENSION, BENIGN SYSTEMIC 11/29/2006   Lisinopril hctz 10-12.5mg , amlodipine 10mg , coreg 25mg  BID, clonidine 0.3 mg BID.   Marland Kitchen Insulin dependent diabetes mellitus   . Personal history of chemotherapy    2018/finished 6 weeks of chemo in Sep 2018  . Personal history of radiation therapy    2018 left breast/finished radiation in Sept 2018 per pt.    Past Surgical History:  Procedure Laterality Date  . AXILLARY LYMPH NODE DISSECTION Left 08/05/2019   Procedure: LEFT AXILLARY LYMPH NODE DISSECTION;  Surgeon: Erroll Luna, MD;  Location: [facility];  Service: General;  Laterality: Left;  . BREAST LUMPECTOMY Left 05/05/2008  . BREAST LUMPECTOMY Left 12/07/2016   malignant  . BREAST LUMPECTOMY WITH RADIOACTIVE SEED AND SENTINEL LYMPH NODE BIOPSY Left 12/07/2016   Procedure: LEFT BREAST LUMPECTOMY WITH RADIOACTIVE SEED AND SENTINEL LYMPH NODE BIOPSY;  Surgeon: Erroll Luna, MD;  Location: [facility];  Service: General;  Laterality: Left;  . IR FLUORO  GUIDE PORT INSERTION RIGHT  01/04/2017  . IR THORACENTESIS ASP PLEURAL SPACE W/IMG GUIDE  03/02/2020  . IR US GUIDE VASC ACCESS RIGHT  01/04/2017  . PORT-A-CATH REMOVAL N/A 05/30/2017   Procedure: REMOVAL PORT-A-CATH;  Surgeon: Erroll Luna, MD;  Location: [facility];  Service: General;  Laterality: N/A;  . RE-EXCISION OF BREAST LUMPECTOMY Left 12/28/2016   Procedure: RE-EXCISION OF BREAST LUMPECTOMY;  Surgeon: Erroll Luna, MD;  Location: [facility];  Service: General;  Laterality:  Left;    Family History  Problem Relation Age of Onset  . Diabetes Sister        x 4  . Heart failure Sister   . Diabetes Brother   . Heart disease Brother   . Diabetes Paternal Grandmother   . Diabetes Paternal Aunt   . Heart attack Brother   . Arthritis Sister   . Hypertension Sister   . Diabetes Sister   . Emphysema Brother   . Cancer Father 86       brain tumor   . Diabetes Child   . Asthma Child   . Hypertension Child   . Diabetes Child   . Diabetes Child   . Colon cancer Neg Hx   . Stomach cancer Neg Hx     Social History   Socioeconomic History  . Marital status: Widowed    Spouse name: Not on file  . Number of children: 4  . Years of education: Not on file  . Highest education level: Not on file  Occupational History  . Occupation: DAY Armed forces operational officer: [employer]  Tobacco Use  . Smoking status: Never Smoker  . Smokeless tobacco: Former Systems developer    Types: Snuff  Vaping Use  . Vaping Use: Never used  Substance and Sexual Activity  . Alcohol use: No    Alcohol/week: 0.0 standard drinks  . Drug use: No  . Sexual activity: Not Currently    Partners: Male  Other Topics Concern  . Not on file  Social History Narrative  . Not on file   Social Determinants of Health   Financial Resource Strain:   . Difficulty of Paying Living Expenses:   Food Insecurity:   . Worried About Charity fundraiser in the Last Year:   . Arboriculturist in the Last Year:   Transportation Needs:   . Film/video editor (Medical):   Marland Kitchen Lack of Transportation (Non-Medical):   Physical Activity:   . Days of Exercise per Week:   . Minutes of Exercise per Session:   Stress:   . Feeling of Stress :   Social Connections:   . Frequency of Communication with Friends and Family:   . Frequency of Social Gatherings with Friends and Family:   . Attends Religious Services:   . Active Member of Clubs or Organizations:   . Attends Archivist Meetings:   Marland Kitchen Marital  Status:   Intimate Partner Violence:   . Fear of Current or Ex-Partner:   . Emotionally Abused:   Marland Kitchen Physically Abused:   . Sexually Abused:     Past Medical History, Surgical history, Social history, and Family history were reviewed and updated as appropriate.   Please see review of systems for further details on the patient's review from today.   Review of Systems:  Review of Systems  Constitutional: Negative for chills, diaphoresis and fever.  HENT: Negative for trouble swallowing.   Respiratory: Positive for shortness  of breath. Negative for cough, choking, chest tightness, wheezing and stridor.   Cardiovascular: Negative for chest pain and palpitations.    Objective:   Physical Exam:  BP (!) 119/53 (BP Location: Right Arm, Patient Position: Sitting)   Pulse 74   Temp (!) 97.5 F (36.4 C) (Temporal)   Resp 17   Ht 5\' 2"  (1.575 m)   Wt 200 lb 6.4 oz (90.9 kg)   LMP  (LMP Unknown)   SpO2 100%   BMI 36.65 kg/m  ECOG: 1  Physical Exam Constitutional:      General: She is not in acute distress.    Appearance: She is not diaphoretic.  HENT:     Head: Normocephalic and atraumatic.     Right Ear: External ear normal.     Left Ear: External ear normal.     Mouth/Throat:     Pharynx: No oropharyngeal exudate.  Cardiovascular:     Rate and Rhythm: Normal rate and regular rhythm.     Heart sounds: Normal heart sounds. No murmur heard.  No friction rub. No gallop.   Pulmonary:     Effort: Pulmonary effort is normal. No respiratory distress.     Breath sounds: Examination of the right-lower field reveals decreased breath sounds. Examination of the left-lower field reveals decreased breath sounds. Decreased breath sounds present. No wheezing or rales.     Comments: Bibasilar crackles noted. Musculoskeletal:     Cervical back: Normal range of motion and neck supple.  Lymphadenopathy:     Cervical: No cervical adenopathy.  Skin:    General: Skin is warm and dry.      Findings: No erythema or rash.  Neurological:     Mental Status: She is alert.     Coordination: Coordination normal.     Gait: Gait abnormal (Patient is ambulating with the use of a wheelchair).  Psychiatric:        Behavior: Behavior normal.        Thought Content: Thought content normal.        Judgment: Judgment normal.     Lab Review:     Component Value Date/Time   NA 139 03/15/2020 1512   NA 141 06/12/2017 1005   K 4.1 03/15/2020 1512   K 4.0 06/12/2017 1005   CL 101 03/15/2020 1512   CO2 27 03/15/2020 1512   CO2 26 06/12/2017 1005   GLUCOSE 110 (H) 03/15/2020 1512   GLUCOSE 184 (H) 06/12/2017 1005   BUN 16 03/15/2020 1512   BUN 14.9 06/12/2017 1005   CREATININE 1.54 (H) 03/15/2020 1512   CREATININE 1.66 (H) 11/07/2019 1531   CREATININE 1.1 06/12/2017 1005   CALCIUM 8.7 (L) 03/15/2020 1512   CALCIUM 9.6 06/12/2017 1005   PROT 6.8 03/15/2020 1512   PROT 7.2 06/12/2017 1005   ALBUMIN 2.9 (L) 03/15/2020 1512   ALBUMIN 3.5 06/12/2017 1005   AST 16 03/15/2020 1512   AST 8 06/12/2017 1005   ALT 9 03/15/2020 1512   ALT <6 06/12/2017 1005   ALKPHOS 116 03/15/2020 1512   ALKPHOS 75 06/12/2017 1005   BILITOT 0.4 03/15/2020 1512   BILITOT 0.29 06/12/2017 1005   GFRNONAA 32 (L) 03/15/2020 1512   GFRAA 37 (L) 03/15/2020 1512       Component Value Date/Time   WBC 12.6 (H) 03/15/2020 1512   RBC 3.41 (L) 03/15/2020 1512   HGB 10.4 (L) 03/15/2020 1512   HGB 11.4 (L) 06/12/2017 1005   HCT 30.3 (L) 03/15/2020 1512  HCT 33.6 (L) 04/02/2019 1035   HCT 34.0 (L) 06/12/2017 1005   PLT 257 03/15/2020 1512   PLT 218 06/12/2017 1005   MCV 88.9 03/15/2020 1512   MCV 82.9 06/12/2017 1005   MCH 30.5 03/15/2020 1512   MCHC 34.3 03/15/2020 1512   RDW 17.6 (H) 03/15/2020 1512   RDW 15.4 (H) 06/12/2017 1005   LYMPHSABS 1.0 03/15/2020 1512   LYMPHSABS 0.7 (L) 06/12/2017 1005   MONOABS 0.7 03/15/2020 1512   MONOABS 0.2 06/12/2017 1005   EOSABS 0.1 03/15/2020 1512   EOSABS 0.1  06/12/2017 1005   BASOSABS 0.0 03/15/2020 1512   BASOSABS 0.0 06/12/2017 1005   -------------------------------  Imaging from last 24 hours (if applicable):  Radiology interpretation: CT ABDOMEN PELVIS WO CONTRAST  Result Date: 03/02/2020 CLINICAL DATA:  Abdominal distension. History of malignancy. Evaluate for possible bowel obstruction. EXAM: CT ABDOMEN AND PELVIS WITHOUT CONTRAST TECHNIQUE: Multidetector CT imaging of the abdomen and pelvis was performed following the standard protocol without IV contrast. COMPARISON:  Chest CT, same date and prior abdominal CT scan 01/17/2018 FINDINGS: Lower chest: There are bilateral pleural effusions and numerous small metastatic pulmonary nodules. The heart is normal in size. Small amount of pericardial fluid without overt effusion. Hepatobiliary: 3 cm segment 6 liver lesion again demonstrated. No other obvious hepatic lesions without contrast. The gallbladder appears normal. No intra or extrahepatic biliary dilatation. Pancreas: Advanced pancreatic atrophy but no mass or inflammation. Spleen: Normal size.  No focal lesions. Adrenals/Urinary Tract: The adrenal glands are unremarkable. Some residual contrast in the collecting system of both kidneys from the recent chest CT. Prior numerous bilateral renal cysts but no worrisome renal lesions or hydronephrosis. The bladder contains contrast. No bladder mass or asymmetric bladder wall thickening. A mild to moderate cystocele is noted. Stomach/Bowel: The stomach, duodenum, small bowel and colon are unremarkable. No acute inflammatory changes, mass lesions or obstructive findings. The terminal ileum and appendix are normal. Contrast gets all the way to the colon and there are no dilated small bowel loops. Vascular/Lymphatic: Scattered atherosclerotic calcifications involving the aorta and iliac arteries but no aneurysm. No mesenteric or retroperitoneal mass or lymphadenopathy. Reproductive: Enlarged fibroid uterus with  calcified fibroids. The ovaries are normal. Other: No abdominal wall hernia or subcutaneous lesions are identified. No free abdominal/pelvic fluid collections. Musculoskeletal: No significant bony findings. IMPRESSION: 1. No acute abdominal/pelvic findings or lymphadenopathy. No findings for small bowel obstruction. 2. 3 cm segment 6 liver lesion consistent with metastatic disease. 3. Enlarged fibroid uterus with calcified fibroids. 4. Bilateral pleural effusions and numerous small metastatic pulmonary nodules. Aortic Atherosclerosis (ICD10-I70.0). Electronically Signed   By: Marijo Sanes M.D.   On: 03/02/2020 10:58   DG Chest 1 View  Result Date: 03/18/2020 CLINICAL DATA:  79 year old female status post right thoracentesis. EXAM: CHEST  1 VIEW COMPARISON:  Chest x-ray 03/02/2020. FINDINGS: No pneumothorax. Small left pleural effusion. No definite right pleural effusion. Patchy multifocal interstitial and airspace disease noted throughout the mid to lower lungs bilaterally. No evidence of pulmonary edema. Heart size is normal. Upper mediastinal contours are within normal limits. Surgical clips in the left axillary region, likely from prior lymph node dissection. IMPRESSION: 1. No pneumothorax in this patient status post thoracentesis. 2. Small left pleural effusion. 3. Multilobar bilateral pneumonia throughout the mid to lower lungs bilaterally. Electronically Signed   By: Vinnie Langton M.D.   On: 03/18/2020 11:21   DG Chest 1 View  Result Date: 03/02/2020 CLINICAL DATA:  Status post  thoracentesis. Additional history provided: Post right thoracentesis, patient still having some shortness of breath, no unusual pain. EXAM: CHEST  1 VIEW COMPARISON:  CT angiogram chest 10/03/2019, chest radiograph 03/01/2020. FINDINGS: Unchanged cardiomediastinal silhouette. Interval decrease in size of a right pleural effusion consistent with provided history of interval thoracentesis. Unchanged trace left pleural effusion.  Multiple pulmonary nodules were better appreciated on chest CT performed earlier the same day. No evidence of pneumothorax. Surgical clips within the left axilla. Known osseous metastatic disease better appreciated on same-day chest CT. IMPRESSION: Interval decrease in conspicuity of a right pleural effusion consistent with interval thoracentesis. No evidence of pneumothorax. Additional findings without interval change as described. Electronically Signed   By: Kellie Simmering DO   On: 03/02/2020 14:12   DG Chest 2 View  Result Date: 03/01/2020 CLINICAL DATA:  Patient reports sob and cough with chest congestion for 3 months. Reports thoracentesis 2 days ago. EXAM: CHEST - 2 VIEW COMPARISON:  Chest radiograph 01/26/2020, 02/24/2020 FINDINGS: Stable cardiomediastinal contours. There is a small right pleural effusion with adjacent opacities possibly representing atelectasis. There is a small nonspecific opacity in the right mid lung which appears new from prior. The left lung is clear. No pneumothorax. No acute finding in the visualized skeleton. IMPRESSION: 1. Small right pleural effusion with adjacent opacities possibly representing atelectasis. 2. New small focal opacity in the right mid lung is nonspecific, possibly atelectasis though early infection not excluded. Electronically Signed   By: Audie Pinto M.D.   On: 03/01/2020 17:50   CT Angio Chest PE W and/or Wo Contrast  Result Date: 03/02/2020 CLINICAL DATA:  Shortness of breath, history of recent thoracentesis EXAM: CT ANGIOGRAPHY CHEST WITH CONTRAST TECHNIQUE: Multidetector CT imaging of the chest was performed using the standard protocol during bolus administration of intravenous contrast. Multiplanar CT image reconstructions and MIPs were obtained to evaluate the vascular anatomy. CONTRAST:  88mL OMNIPAQUE IOHEXOL 350 MG/ML SOLN COMPARISON:  Chest x-ray from the previous day, CT from 10/31/2019, PET-CT from 01/07/2020 FINDINGS: Cardiovascular:  Thoracic aorta and its branches are well visualize without aneurysmal dilatation or dissection. No cardiac enlargement is seen. Mild pericardial effusion is noted. The pulmonary artery shows a normal branching pattern. No definitive filling defect to suggest pulmonary embolism is seen. Mediastinum/Nodes: Thoracic inlet shows lymphadenopathy in the left supraclavicular region as well as posterior to the clavicle on the left. Scattered mediastinal lymph nodes are noted in the pre-vascular region as well as AP window slightly increased when compared with the prior exam. Additionally there are hilar lymph nodes identified as well as subcarinal adenopathy increased from the prior study. Subcarinal lymph node measures at least 2.1 cm in short axis and previously measured approximately 1 cm in short axis on prior PET-CT. Right hilar lymph node measures 2.1 cm in short axis also increased from the prior study. Significant bulky adenopathy is noted in the left axilla as well as a focal soft tissue mass lesion which measures 5.1 by 2.5 cm best seen on image number 136 of series 7. This is consistent with recurrent disease and is similar to that seen on recent PET-CT. Bulky lymph nodes are noted associated with this mass which appear increased when compared with the prior exam consistent with progression of disease. Multiple subpectoral lymph nodes are noted on the left also consistent with progressive disease. Lungs/Pleura: Small left pleural effusion is noted consistent with the recent thoracentesis. Moderate right-sided pleural effusion is noted. The lungs demonstrate multiple parenchymal nodules which have  increased in size and number when compared with the prior exam. Largest of these on the left is posterior along the pleural margin measuring 1.9 cm. The largest of these on the right is seen in the upper lobe measuring approximately 11 mm. Right lower lobe consolidation is noted of a mild degree. Upper Abdomen:  Visualized upper abdomen demonstrates multiple renal cysts. Additionally there is a rounded hypodensity in the posterior aspect of the right lobe of the liver best seen on image number 354 of series 7 consistent with metastatic disease. This is stable from the prior PET-CT. Musculoskeletal: Degenerative changes of the thoracic spine are noted. Medial scapular metastatic lesion is noted on the left similar to that seen on prior PET-CT. Pathologic fracture is again noted involving the right eighth rib posteriorly. Some mottled areas of bony density are noted within the ribcage consistent with metastatic disease. Review of the MIP images confirms the above findings. IMPRESSION: No evidence of pulmonary emboli. Progression of disease particularly within the lungs, hila and mediastinum consistent with the known history of breast carcinoma with recurrence. Persistent left breast mass is noted with increasing left axillary and left chest wall lymphadenopathy. Bilateral pleural effusions right greater than left. Bony metastatic disease. Electronically Signed   By: Inez Catalina M.D.   On: 03/02/2020 01:41   DG Chest Port 1 View  Result Date: 02/24/2020 CLINICAL DATA:  Post left thoracentesis EXAM: PORTABLE CHEST 1 VIEW COMPARISON:  01/26/2020 FINDINGS: No residual pleural effusion on the left. No complication or pneumothorax Small right pleural effusion slightly increased from the prior study. Mild right lower lobe atelectasis. Negative for heart failure or edema Left mastectomy with clips in the left axilla IMPRESSION: No significant left pleural effusion following thoracentesis. No pneumothorax Progression of small right effusion and right lower lobe atelectasis since 01/26/2020. Electronically Signed   By: Franchot Gallo M.D.   On: 02/24/2020 16:23   VAS [name] LOWER EXTREMITY VENOUS (DVT)  Result Date: 03/02/2020  Lower Venous DVTStudy Indications: Pain, and Edema.  Limitations: Pain, poor ultrasound/tissue interface  and body habitus. Performing Technologist: Antonieta Pert RDMS, RVT  Examination Guidelines: A complete evaluation includes B-mode imaging, spectral Doppler, color Doppler, and power Doppler as needed of all accessible portions of each vessel. Bilateral testing is considered an integral part of a complete examination. Limited examinations for reoccurring indications may be performed as noted. The reflux portion of the exam is performed with the patient in reverse Trendelenburg.  +---------+---------------+---------+-----------+----------+-------------------+ RIGHT    CompressibilityPhasicitySpontaneityPropertiesThrombus Aging      +---------+---------------+---------+-----------+----------+-------------------+ CFV      Full           Yes      Yes                                      +---------+---------------+---------+-----------+----------+-------------------+ SFJ      Full                                                             +---------+---------------+---------+-----------+----------+-------------------+ FV Prox  Full                                                             +---------+---------------+---------+-----------+----------+-------------------+  FV Mid   Full                                                             +---------+---------------+---------+-----------+----------+-------------------+ FV Distal               Yes      Yes                  could not tolerate                                                        compression         +---------+---------------+---------+-----------+----------+-------------------+ PFV      Full                                                             +---------+---------------+---------+-----------+----------+-------------------+ POP                                                   Not visualized      +---------+---------------+---------+-----------+----------+-------------------+ PTV                      Yes                           could not tolerate                                                        compression         +---------+---------------+---------+-----------+----------+-------------------+ PERO                    Yes                           could not tolerate                                                        compression         +---------+---------------+---------+-----------+----------+-------------------+ GSV      Full                                                             +---------+---------------+---------+-----------+----------+-------------------+   +---------+---------------+---------+-----------+----------+-------------------+  LEFT     CompressibilityPhasicitySpontaneityPropertiesThrombus Aging      +---------+---------------+---------+-----------+----------+-------------------+ CFV      Full           Yes      Yes                                      +---------+---------------+---------+-----------+----------+-------------------+ SFJ      Full                                                             +---------+---------------+---------+-----------+----------+-------------------+ FV Prox                 Yes                           could not tolerate                                                        compression         +---------+---------------+---------+-----------+----------+-------------------+ FV Mid                  Yes                           could not tolerate                                                        compression         +---------+---------------+---------+-----------+----------+-------------------+ FV Distal               Yes      Yes                  could not tolerate                                                        compression         +---------+---------------+---------+-----------+----------+-------------------+ POP                      Yes      Yes                  could not tolerate                                                        compression         +---------+---------------+---------+-----------+----------+-------------------+ PTV  Yes                           could not tolerate                                                        compression         +---------+---------------+---------+-----------+----------+-------------------+ PERO                    Yes                           could not tolerate                                                        compression         +---------+---------------+---------+-----------+----------+-------------------+ GSV      Full                                                             +---------+---------------+---------+-----------+----------+-------------------+     Summary: RIGHT: - There is no evidence of deep vein thrombosis in the lower extremity. However, portions of this examination were limited- see technologist comments above.  - No cystic structure found in the popliteal fossa.  LEFT: - There is no evidence of deep vein thrombosis in the lower extremity. However, portions of this examination were limited- see technologist comments above.  - No cystic structure found in the popliteal fossa. - Patient could not tolerate compression and almost did not tolerate any touch. best images possible.  *See table(s) above for measurements and observations. Electronically signed by Deitra Mayo MD on 03/02/2020 at 6:04:43 PM.    Final    IR THORACENTESIS ASP PLEURAL SPACE W/IMG GUIDE  Result Date: 03/02/2020 INDICATION: SHORTNESS OF BREATH, RIGHT PLEURAL EFFUSION EXAM: ULTRASOUND GUIDED RIGHT THORACENTESIS MEDICATIONS: 1% lidocaine local COMPLICATIONS: None immediate. PROCEDURE: An ultrasound guided thoracentesis was thoroughly discussed with the patient and questions answered. The benefits, risks,  alternatives and complications were also discussed. The patient understands and wishes to proceed with the procedure. Written consent was obtained. Ultrasound was performed to localize and mark an adequate pocket of fluid in the right chest. The area was then prepped and draped in the normal sterile fashion. 1% Lidocaine was used for local anesthesia. Under ultrasound guidance a 6 Fr Safe-T-Centesis catheter was introduced. Thoracentesis was performed. The catheter was removed and a dressing applied. FINDINGS: A total of approximately 300 cc of blood tinged pleural fluid was removed. Samples were sent to the laboratory as requested by the clinical team. IMPRESSION: Successful ultrasound guided right thoracentesis yielding 300 cc of pleural fluid. Electronically Signed   By: Jerilynn Mages.  Shick M.D.   On: 03/02/2020 15:02   US Thoracentesis Asp Pleural space w/IMG guide  Result Date: 03/18/2020 INDICATION: Patient with history of metastatic breast cancer, dyspnea, recurrent right pleural effusion. Request made  for therapeutic right thoracentesis. EXAM: ULTRASOUND GUIDED THERAPEUTIC RIGHT THORACENTESIS MEDICATIONS: None COMPLICATIONS: None immediate. PROCEDURE: An ultrasound guided thoracentesis was thoroughly discussed with the patient and questions answered. The benefits, risks, alternatives and complications were also discussed. The patient understands and wishes to proceed with the procedure. Written consent was obtained. Ultrasound was performed to localize and mark an adequate pocket of fluid in the right chest. The area was then prepped and draped in the normal sterile fashion. 1% Lidocaine was used for local anesthesia. Under ultrasound guidance a 6 Fr Safe-T-Centesis catheter was introduced. Thoracentesis was performed. The catheter was removed and a dressing applied. FINDINGS: A total of approximately 400 cc of slightly hazy, yellow fluid was removed. IMPRESSION: Successful ultrasound guided therapeutic right  thoracentesis yielding 400 cc of pleural fluid. Read by: Rowe Robert, PA-C Electronically Signed   By: Corrie Mckusick D.O.   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 Interventional radiology asked to perform a therapeutic and diagnostic thoracentesis. EXAM: ULTRASOUND GUIDED LEFT THORACENTESIS MEDICATIONS: 1% lidocaine 10 mL COMPLICATIONS: None immediate. PROCEDURE: An ultrasound guided thoracentesis was thoroughly discussed with the patient and questions answered. The benefits, risks, alternatives and complications were also discussed. The patient understands and wishes to proceed with the procedure. Written consent was obtained. Ultrasound was performed to localize and mark an adequate pocket of fluid in the left chest. The area was then prepped and draped in the normal sterile fashion. 1% Lidocaine was used for local anesthesia. Under ultrasound guidance a 6 Fr Safe-T-Centesis catheter was introduced. Thoracentesis was performed. The catheter was removed and a dressing applied. FINDINGS: A total of approximately 320 ml of amber fluid was removed. Samples were sent to the laboratory as requested by the clinical team. IMPRESSION: Successful ultrasound guided left thoracentesis yielding 320 ml of pleural fluid. Read by: Soyla Dryer, NP Electronically Signed   By: Jacqulynn Cadet M.D.   On: 02/24/2020 16:27        This case was discussed with Dr. Burr Medico. She expressed agreement with my management of this patient.

## 2020-03-18 NOTE — Telephone Encounter (Signed)
-----   Message from [name], MD sent at 03/17/2020 11:19 PM EDT ----- Clearnce Sorrel,  Thanks for call pt's daughter today, and I am glad she will pick up at [place] today. Her daughter-in-law Larene Adelaido Nicklaus is a Therapist, sports at [employer] and lives with pt. Could you or Benjamine Mola give her a call and briefly review Xeloda with her? Pt can start this week or next Monday  [name], please schedule her lab and f/u in 2 weeks   Thanks  Krista Blue

## 2020-03-18 NOTE — Telephone Encounter (Signed)
Scheduling message sent. 

## 2020-03-18 NOTE — Patient Instructions (Signed)
Community-Acquired Pneumonia, Adult Pneumonia is an infection of the lungs. It causes swelling in the airways of the lungs. Mucus and fluid may also build up inside the airways. One type of pneumonia can happen while a person is in a hospital. A different type can happen when a person is not in a hospital (community-acquired pneumonia).  What are the causes?  This condition is caused by germs (viruses, bacteria, or fungi). Some types of germs can be passed from one person to another. This can happen when you breathe in droplets from the cough or sneeze of an infected person. What increases the risk? You are more likely to develop this condition if you:  Have a long-term (chronic) disease, such as: ? Chronic obstructive pulmonary disease (COPD). ? Asthma. ? Cystic fibrosis. ? Congestive heart failure. ? Diabetes. ? Kidney disease.  Have HIV.  Have sickle cell disease.  Have had your spleen removed.  Do not take good care of your teeth and mouth (poor dental hygiene).  Have a medical condition that increases the risk of breathing in droplets from your own mouth and nose.  Have a weakened body defense system (immune system).  Are a smoker.  Travel to areas where the germs that cause this illness are common.  Are around certain animals or the places they live. What are the signs or symptoms?  A dry cough.  A wet (productive) cough.  Fever.  Sweating.  Chest pain. This often happens when breathing deeply or coughing.  Fast breathing or trouble breathing.  Shortness of breath.  Shaking chills.  Feeling tired (fatigue).  Muscle aches. How is this treated? Treatment for this condition depends on many things. Most adults can be treated at home. In some cases, treatment must happen in a hospital. Treatment may include:  Medicines given by mouth or through an IV tube.  Being given extra oxygen.  Respiratory therapy. In rare cases, treatment for very bad pneumonia  may include:  Using a machine to help you breathe.  Having a procedure to remove fluid from around your lungs. Follow these instructions at home: Medicines  Take over-the-counter and prescription medicines only as told by your doctor. ? Only take cough medicine if you are losing sleep.  If you were prescribed an antibiotic medicine, take it as told by your doctor. Do not stop taking the antibiotic even if you start to feel better. General instructions   Sleep with your head and neck raised (elevated). You can do this by sleeping in a recliner or by putting a few pillows under your head.  Rest as needed. Get at least 8 hours of sleep each night.  Drink enough water to keep your pee (urine) pale yellow.  Eat a healthy diet that includes plenty of vegetables, fruits, whole grains, low-fat dairy products, and lean protein.  Do not use any products that contain nicotine or tobacco. These include cigarettes, e-cigarettes, and chewing tobacco. If you need help quitting, ask your doctor.  Keep all follow-up visits as told by your doctor. This is important. How is this prevented? A shot (vaccine) can help prevent pneumonia. Shots are often suggested for:  People older than 79 years of age.  People older than 79 years of age who: ? Are having cancer treatment. ? Have long-term (chronic) lung disease. ? Have problems with their body's defense system. You may also prevent pneumonia if you take these actions:  Get the flu (influenza) shot every year.  Go to the dentist as   often as told.  Wash your hands often. If you cannot use soap and water, use hand sanitizer. Contact a doctor if:  You have a fever.  You lose sleep because your cough medicine does not help. Get help right away if:  You are short of breath and it gets worse.  You have more chest pain.  Your sickness gets worse. This is very serious if: ? You are an older adult. ? Your body's defense system is weak.  You  cough up blood. Summary  Pneumonia is an infection of the lungs.  Most adults can be treated at home. Some will need treatment in a hospital.  Drink enough water to keep your pee pale yellow.  Get at least 8 hours of sleep each night. This information is not intended to replace advice given to you by your health care provider. Make sure you discuss any questions you have with your health care provider. Document Revised: 01/08/2019 Document Reviewed: 05/16/2018 Elsevier Patient Education  2020 [employer]  

## 2020-03-18 NOTE — Telephone Encounter (Signed)
Scheduled per 6/17 sch message. Unable to reach pt. Left voicemail- appts added on 7/7.

## 2020-03-19 DIAGNOSIS — E1122 Type 2 diabetes mellitus with diabetic chronic kidney disease: Secondary | ICD-10-CM | POA: Diagnosis not present

## 2020-03-19 DIAGNOSIS — C7951 Secondary malignant neoplasm of bone: Secondary | ICD-10-CM | POA: Diagnosis not present

## 2020-03-19 DIAGNOSIS — I129 Hypertensive chronic kidney disease with stage 1 through stage 4 chronic kidney disease, or unspecified chronic kidney disease: Secondary | ICD-10-CM | POA: Diagnosis not present

## 2020-03-19 DIAGNOSIS — C787 Secondary malignant neoplasm of liver and intrahepatic bile duct: Secondary | ICD-10-CM | POA: Diagnosis not present

## 2020-03-19 DIAGNOSIS — J45901 Unspecified asthma with (acute) exacerbation: Secondary | ICD-10-CM | POA: Diagnosis not present

## 2020-03-19 DIAGNOSIS — C7981 Secondary malignant neoplasm of breast: Secondary | ICD-10-CM | POA: Diagnosis not present

## 2020-03-22 ENCOUNTER — Other Ambulatory Visit: Payer: Self-pay

## 2020-03-22 ENCOUNTER — Inpatient Hospital Stay (HOSPITAL_COMMUNITY)
Admission: EM | Admit: 2020-03-22 | Discharge: 2020-03-25 | DRG: 180 | Disposition: A | Discharge status: Home Health | Payer: Medicare Other | Attending: Internal Medicine | Admitting: Internal Medicine

## 2020-03-22 ENCOUNTER — Emergency Department (HOSPITAL_COMMUNITY): Payer: Medicare Other

## 2020-03-22 ENCOUNTER — Telehealth: Payer: Self-pay | Admitting: Pulmonary Disease

## 2020-03-22 DIAGNOSIS — C7951 Secondary malignant neoplasm of bone: Secondary | ICD-10-CM | POA: Diagnosis present

## 2020-03-22 DIAGNOSIS — K219 Gastro-esophageal reflux disease without esophagitis: Secondary | ICD-10-CM | POA: Diagnosis present

## 2020-03-22 DIAGNOSIS — Z794 Long term (current) use of insulin: Secondary | ICD-10-CM | POA: Diagnosis not present

## 2020-03-22 DIAGNOSIS — J189 Pneumonia, unspecified organism: Secondary | ICD-10-CM | POA: Diagnosis present

## 2020-03-22 DIAGNOSIS — C7801 Secondary malignant neoplasm of right lung: Secondary | ICD-10-CM | POA: Diagnosis present

## 2020-03-22 DIAGNOSIS — Z170 Estrogen receptor positive status [ER+]: Secondary | ICD-10-CM

## 2020-03-22 DIAGNOSIS — C773 Secondary and unspecified malignant neoplasm of axilla and upper limb lymph nodes: Secondary | ICD-10-CM | POA: Diagnosis present

## 2020-03-22 DIAGNOSIS — Z833 Family history of diabetes mellitus: Secondary | ICD-10-CM

## 2020-03-22 DIAGNOSIS — C779 Secondary and unspecified malignant neoplasm of lymph node, unspecified: Secondary | ICD-10-CM | POA: Diagnosis present

## 2020-03-22 DIAGNOSIS — J9811 Atelectasis: Secondary | ICD-10-CM | POA: Diagnosis present

## 2020-03-22 DIAGNOSIS — E785 Hyperlipidemia, unspecified: Secondary | ICD-10-CM | POA: Diagnosis present

## 2020-03-22 DIAGNOSIS — E1165 Type 2 diabetes mellitus with hyperglycemia: Secondary | ICD-10-CM | POA: Diagnosis present

## 2020-03-22 DIAGNOSIS — Z20822 Contact with and (suspected) exposure to covid-19: Secondary | ICD-10-CM | POA: Diagnosis present

## 2020-03-22 DIAGNOSIS — E1169 Type 2 diabetes mellitus with other specified complication: Secondary | ICD-10-CM | POA: Diagnosis present

## 2020-03-22 DIAGNOSIS — R609 Edema, unspecified: Secondary | ICD-10-CM | POA: Diagnosis present

## 2020-03-22 DIAGNOSIS — R680 Hypothermia, not associated with low environmental temperature: Secondary | ICD-10-CM | POA: Diagnosis present

## 2020-03-22 DIAGNOSIS — R0602 Shortness of breath: Secondary | ICD-10-CM | POA: Diagnosis present

## 2020-03-22 DIAGNOSIS — C787 Secondary malignant neoplasm of liver and intrahepatic bile duct: Secondary | ICD-10-CM | POA: Diagnosis present

## 2020-03-22 DIAGNOSIS — F329 Major depressive disorder, single episode, unspecified: Secondary | ICD-10-CM | POA: Diagnosis present

## 2020-03-22 DIAGNOSIS — IMO0002 Reserved for concepts with insufficient information to code with codable children: Secondary | ICD-10-CM

## 2020-03-22 DIAGNOSIS — Z9981 Dependence on supplemental oxygen: Secondary | ICD-10-CM | POA: Diagnosis not present

## 2020-03-22 DIAGNOSIS — J440 Chronic obstructive pulmonary disease with acute lower respiratory infection: Secondary | ICD-10-CM | POA: Diagnosis present

## 2020-03-22 DIAGNOSIS — J9690 Respiratory failure, unspecified, unspecified whether with hypoxia or hypercapnia: Secondary | ICD-10-CM | POA: Diagnosis present

## 2020-03-22 DIAGNOSIS — J441 Chronic obstructive pulmonary disease with (acute) exacerbation: Secondary | ICD-10-CM | POA: Diagnosis present

## 2020-03-22 DIAGNOSIS — J910 Malignant pleural effusion: Secondary | ICD-10-CM | POA: Diagnosis present

## 2020-03-22 DIAGNOSIS — R319 Hematuria, unspecified: Secondary | ICD-10-CM | POA: Diagnosis present

## 2020-03-22 DIAGNOSIS — C7802 Secondary malignant neoplasm of left lung: Principal | ICD-10-CM | POA: Diagnosis present

## 2020-03-22 DIAGNOSIS — Z515 Encounter for palliative care: Secondary | ICD-10-CM | POA: Diagnosis not present

## 2020-03-22 DIAGNOSIS — Z95828 Presence of other vascular implants and grafts: Secondary | ICD-10-CM

## 2020-03-22 DIAGNOSIS — N183 Chronic kidney disease, stage 3 unspecified: Secondary | ICD-10-CM | POA: Diagnosis present

## 2020-03-22 DIAGNOSIS — Z6835 Body mass index (BMI) 35.0-35.9, adult: Secondary | ICD-10-CM

## 2020-03-22 DIAGNOSIS — I272 Pulmonary hypertension, unspecified: Secondary | ICD-10-CM | POA: Diagnosis present

## 2020-03-22 DIAGNOSIS — I10 Essential (primary) hypertension: Secondary | ICD-10-CM | POA: Diagnosis not present

## 2020-03-22 DIAGNOSIS — F419 Anxiety disorder, unspecified: Secondary | ICD-10-CM | POA: Diagnosis present

## 2020-03-22 DIAGNOSIS — Z66 Do not resuscitate: Secondary | ICD-10-CM | POA: Diagnosis present

## 2020-03-22 DIAGNOSIS — Z808 Family history of malignant neoplasm of other organs or systems: Secondary | ICD-10-CM

## 2020-03-22 DIAGNOSIS — Z923 Personal history of irradiation: Secondary | ICD-10-CM

## 2020-03-22 DIAGNOSIS — C50919 Malignant neoplasm of unspecified site of unspecified female breast: Secondary | ICD-10-CM | POA: Diagnosis present

## 2020-03-22 DIAGNOSIS — I129 Hypertensive chronic kidney disease with stage 1 through stage 4 chronic kidney disease, or unspecified chronic kidney disease: Secondary | ICD-10-CM | POA: Diagnosis present

## 2020-03-22 DIAGNOSIS — Z8249 Family history of ischemic heart disease and other diseases of the circulatory system: Secondary | ICD-10-CM

## 2020-03-22 DIAGNOSIS — C801 Malignant (primary) neoplasm, unspecified: Secondary | ICD-10-CM | POA: Diagnosis not present

## 2020-03-22 DIAGNOSIS — C50412 Malignant neoplasm of upper-outer quadrant of left female breast: Secondary | ICD-10-CM | POA: Diagnosis not present

## 2020-03-22 DIAGNOSIS — Z8719 Personal history of other diseases of the digestive system: Secondary | ICD-10-CM

## 2020-03-22 DIAGNOSIS — F17200 Nicotine dependence, unspecified, uncomplicated: Secondary | ICD-10-CM | POA: Diagnosis present

## 2020-03-22 DIAGNOSIS — E1122 Type 2 diabetes mellitus with diabetic chronic kidney disease: Secondary | ICD-10-CM | POA: Diagnosis present

## 2020-03-22 DIAGNOSIS — Z79899 Other long term (current) drug therapy: Secondary | ICD-10-CM

## 2020-03-22 DIAGNOSIS — Z9221 Personal history of antineoplastic chemotherapy: Secondary | ICD-10-CM

## 2020-03-22 DIAGNOSIS — R0603 Acute respiratory distress: Secondary | ICD-10-CM

## 2020-03-22 DIAGNOSIS — J9621 Acute and chronic respiratory failure with hypoxia: Secondary | ICD-10-CM

## 2020-03-22 DIAGNOSIS — Z825 Family history of asthma and other chronic lower respiratory diseases: Secondary | ICD-10-CM

## 2020-03-22 DIAGNOSIS — I313 Pericardial effusion (noninflammatory): Secondary | ICD-10-CM | POA: Diagnosis not present

## 2020-03-22 DIAGNOSIS — I3131 Malignant pericardial effusion in diseases classified elsewhere: Secondary | ICD-10-CM | POA: Diagnosis present

## 2020-03-22 DIAGNOSIS — J9601 Acute respiratory failure with hypoxia: Secondary | ICD-10-CM | POA: Diagnosis not present

## 2020-03-22 DIAGNOSIS — T68XXXA Hypothermia, initial encounter: Secondary | ICD-10-CM

## 2020-03-22 DIAGNOSIS — N1831 Chronic kidney disease, stage 3a: Secondary | ICD-10-CM | POA: Diagnosis not present

## 2020-03-22 DIAGNOSIS — Z7951 Long term (current) use of inhaled steroids: Secondary | ICD-10-CM

## 2020-03-22 DIAGNOSIS — R809 Proteinuria, unspecified: Secondary | ICD-10-CM | POA: Diagnosis present

## 2020-03-22 DIAGNOSIS — Z8261 Family history of arthritis: Secondary | ICD-10-CM

## 2020-03-22 LAB — CBC WITH DIFFERENTIAL/PLATELET
Abs Immature Granulocytes: 0.05 10*3/uL (ref 0.00–0.07)
Basophils Absolute: 0 10*3/uL (ref 0.0–0.1)
Basophils Relative: 0 %
Eosinophils Absolute: 0 10*3/uL (ref 0.0–0.5)
Eosinophils Relative: 0 %
HCT: 30 % — ABNORMAL LOW (ref 36.0–46.0)
Hemoglobin: 9.9 g/dL — ABNORMAL LOW (ref 12.0–15.0)
Immature Granulocytes: 1 %
Lymphocytes Relative: 4 %
Lymphs Abs: 0.3 10*3/uL — ABNORMAL LOW (ref 0.7–4.0)
MCH: 29.4 pg (ref 26.0–34.0)
MCHC: 33 g/dL (ref 30.0–36.0)
MCV: 89 fL (ref 80.0–100.0)
Monocytes Absolute: 0.2 10*3/uL (ref 0.1–1.0)
Monocytes Relative: 3 %
Neutro Abs: 7.5 10*3/uL (ref 1.7–7.7)
Neutrophils Relative %: 92 %
Platelets: 279 10*3/uL (ref 150–400)
RBC: 3.37 MIL/uL — ABNORMAL LOW (ref 3.87–5.11)
RDW: 17.1 % — ABNORMAL HIGH (ref 11.5–15.5)
WBC: 8 10*3/uL (ref 4.0–10.5)
nRBC: 0 % (ref 0.0–0.2)

## 2020-03-22 LAB — BASIC METABOLIC PANEL
Anion gap: 9 (ref 5–15)
BUN: 19 mg/dL (ref 8–23)
CO2: 28 mmol/L (ref 22–32)
Calcium: 8.9 mg/dL (ref 8.9–10.3)
Chloride: 99 mmol/L (ref 98–111)
Creatinine, Ser: 1.41 mg/dL — ABNORMAL HIGH (ref 0.44–1.00)
GFR calc Af Amer: 41 mL/min — ABNORMAL LOW (ref >60)
GFR calc non Af Amer: 36 mL/min — ABNORMAL LOW (ref >60)
Glucose, Bld: 311 mg/dL — ABNORMAL HIGH (ref 70–99)
Potassium: 4.9 mmol/L (ref 3.5–5.1)
Sodium: 136 mmol/L (ref 135–145)

## 2020-03-22 LAB — BRAIN NATRIURETIC PEPTIDE: B Natriuretic Peptide: 120.3 pg/mL — ABNORMAL HIGH (ref 0.0–100.0)

## 2020-03-22 LAB — URINALYSIS, ROUTINE W REFLEX MICROSCOPIC
Bilirubin Urine: NEGATIVE
Glucose, UA: >=500 mg/dL — AB
Ketones, ur: 5 mg/dL — AB
Leukocytes,Ua: NEGATIVE
Nitrite: NEGATIVE
Protein, ur: >=300 mg/dL — AB
Specific Gravity, Urine: 1.01 (ref 1.005–1.030)
pH: 5 (ref 5.0–8.0)

## 2020-03-22 LAB — SARS CORONAVIRUS 2 BY RT PCR (HOSPITAL ORDER, PERFORMED IN ~~LOC~~ HOSPITAL LAB): SARS Coronavirus 2: NEGATIVE

## 2020-03-22 LAB — CBG MONITORING, ED
Glucose-Capillary: 262 mg/dL — ABNORMAL HIGH (ref 70–99)
Glucose-Capillary: 223 mg/dL — ABNORMAL HIGH (ref 70–99)

## 2020-03-22 LAB — TROPONIN I (HIGH SENSITIVITY)
Troponin I (High Sensitivity): 12 ng/L (ref <18)
Troponin I (High Sensitivity): 11 ng/L (ref <18)

## 2020-03-22 MED ORDER — LISINOPRIL 20 MG PO TABS
20.0000 mg | ORAL_TABLET | Freq: Every day | ORAL | Status: DC
  Administered 2020-03-23 – 2020-03-24 (×2): 20 mg via ORAL
  Filled 2020-03-22 (×3): qty 1

## 2020-03-22 MED ORDER — ALBUTEROL SULFATE HFA 108 (90 BASE) MCG/ACT IN AERS
4.0000 | INHALATION_SPRAY | Freq: Once | RESPIRATORY_TRACT | Status: AC
  Administered 2020-03-22: 4 via RESPIRATORY_TRACT
  Filled 2020-03-22: qty 6.7

## 2020-03-22 MED ORDER — HYDROCHLOROTHIAZIDE 12.5 MG PO CAPS
12.5000 mg | ORAL_CAPSULE | Freq: Every day | ORAL | Status: DC
  Administered 2020-03-23 – 2020-03-25 (×3): 12.5 mg via ORAL
  Filled 2020-03-22 (×3): qty 1

## 2020-03-22 MED ORDER — FLUTICASONE PROPIONATE 50 MCG/ACT NA SUSP
1.0000 | Freq: Every day | NASAL | Status: DC
  Administered 2020-03-23 – 2020-03-25 (×3): 1 via NASAL
  Filled 2020-03-22: qty 16

## 2020-03-22 MED ORDER — MIRTAZAPINE 15 MG PO TABS
7.5000 mg | ORAL_TABLET | Freq: Every day | ORAL | Status: DC
  Administered 2020-03-22 – 2020-03-24 (×3): 7.5 mg via ORAL
  Filled 2020-03-22 (×3): qty 1

## 2020-03-22 MED ORDER — SODIUM CHLORIDE 0.9 % IV SOLN
500.0000 mg | Freq: Once | INTRAVENOUS | Status: AC
  Administered 2020-03-22: 500 mg via INTRAVENOUS
  Filled 2020-03-22: qty 500

## 2020-03-22 MED ORDER — SODIUM CHLORIDE 0.9 % IV SOLN
1.0000 g | Freq: Once | INTRAVENOUS | Status: AC
  Administered 2020-03-22: 1 g via INTRAVENOUS
  Filled 2020-03-22: qty 10

## 2020-03-22 MED ORDER — AEROCHAMBER PLUS FLO-VU LARGE MISC
Status: AC
  Administered 2020-03-22: 1
  Filled 2020-03-22: qty 1

## 2020-03-22 MED ORDER — LISINOPRIL-HYDROCHLOROTHIAZIDE 20-12.5 MG PO TABS: 1.0000 | ORAL_TABLET | Freq: Every day | ORAL | Status: DC

## 2020-03-22 MED ORDER — BUSPIRONE HCL 10 MG PO TABS
10.0000 mg | ORAL_TABLET | Freq: Three times a day (TID) | ORAL | Status: DC
  Administered 2020-03-22 – 2020-03-25 (×9): 10 mg via ORAL
  Filled 2020-03-22 (×9): qty 1

## 2020-03-22 MED ORDER — INSULIN GLARGINE 100 UNIT/ML ~~LOC~~ SOLN
28.0000 [IU] | Freq: Every day | SUBCUTANEOUS | Status: DC
  Administered 2020-03-23 – 2020-03-25 (×3): 28 [IU] via SUBCUTANEOUS
  Filled 2020-03-22 (×3): qty 0.28

## 2020-03-22 MED ORDER — HYDROCODONE-ACETAMINOPHEN 5-325 MG PO TABS
1.0000 | ORAL_TABLET | Freq: Three times a day (TID) | ORAL | Status: DC | PRN
  Administered 2020-03-23 – 2020-03-25 (×2): 1 via ORAL
  Filled 2020-03-22 (×3): qty 1

## 2020-03-22 MED ORDER — AMLODIPINE BESYLATE 10 MG PO TABS
10.0000 mg | ORAL_TABLET | Freq: Every day | ORAL | Status: DC
  Administered 2020-03-23 – 2020-03-25 (×3): 10 mg via ORAL
  Filled 2020-03-22 (×2): qty 2
  Filled 2020-03-22 (×2): qty 1

## 2020-03-22 MED ORDER — FAMOTIDINE 20 MG PO TABS
20.0000 mg | ORAL_TABLET | Freq: Every day | ORAL | Status: DC
  Administered 2020-03-22 – 2020-03-25 (×4): 20 mg via ORAL
  Filled 2020-03-22 (×4): qty 1

## 2020-03-22 MED ORDER — AMOXICILLIN-POT CLAVULANATE 875-125 MG PO TABS
1.0000 | ORAL_TABLET | Freq: Two times a day (BID) | ORAL | Status: DC
  Administered 2020-03-22 – 2020-03-25 (×6): 1 via ORAL
  Filled 2020-03-22 (×7): qty 1

## 2020-03-22 MED ORDER — ALBUTEROL SULFATE HFA 108 (90 BASE) MCG/ACT IN AERS: 2.0000 | INHALATION_SPRAY | Freq: Once | RESPIRATORY_TRACT | Status: DC

## 2020-03-22 MED ORDER — INSULIN ASPART 100 UNIT/ML ~~LOC~~ SOLN
0.0000 [IU] | Freq: Three times a day (TID) | SUBCUTANEOUS | Status: DC
  Administered 2020-03-22: 8 [IU] via SUBCUTANEOUS
  Administered 2020-03-22: 5 [IU] via SUBCUTANEOUS
  Administered 2020-03-23: 8 [IU] via SUBCUTANEOUS
  Administered 2020-03-23: 11 [IU] via SUBCUTANEOUS
  Administered 2020-03-23: 8 [IU] via SUBCUTANEOUS
  Administered 2020-03-24 – 2020-03-25 (×2): 3 [IU] via SUBCUTANEOUS
  Administered 2020-03-25: 5 [IU] via SUBCUTANEOUS

## 2020-03-22 MED ORDER — TRAMADOL HCL 50 MG PO TABS: 50.0000 mg | ORAL_TABLET | Freq: Four times a day (QID) | ORAL | Status: DC | PRN

## 2020-03-22 MED ORDER — INSULIN ASPART 100 UNIT/ML ~~LOC~~ SOLN
0.0000 [IU] | Freq: Every day | SUBCUTANEOUS | Status: DC
  Administered 2020-03-23: 2 [IU] via SUBCUTANEOUS
  Administered 2020-03-24: 3 [IU] via SUBCUTANEOUS

## 2020-03-22 MED ORDER — PRAVASTATIN SODIUM 40 MG PO TABS
40.0000 mg | ORAL_TABLET | Freq: Every day | ORAL | Status: DC
  Administered 2020-03-22 – 2020-03-24 (×3): 40 mg via ORAL
  Filled 2020-03-22 (×3): qty 1

## 2020-03-22 MED ORDER — CAPECITABINE 500 MG PO TABS
1500.0000 mg | ORAL_TABLET | Freq: Two times a day (BID) | ORAL | Status: DC
  Administered 2020-03-23 – 2020-03-25 (×4): 1500 mg via ORAL
  Filled 2020-03-22 (×5): qty 3

## 2020-03-22 MED ORDER — CARVEDILOL 25 MG PO TABS
25.0000 mg | ORAL_TABLET | Freq: Two times a day (BID) | ORAL | Status: DC
  Administered 2020-03-22 – 2020-03-25 (×6): 25 mg via ORAL
  Filled 2020-03-22: qty 8
  Filled 2020-03-22 (×2): qty 1
  Filled 2020-03-22: qty 2
  Filled 2020-03-22 (×2): qty 1

## 2020-03-22 MED ORDER — MONTELUKAST SODIUM 10 MG PO TABS
10.0000 mg | ORAL_TABLET | Freq: Every day | ORAL | Status: DC
  Administered 2020-03-22 – 2020-03-24 (×3): 10 mg via ORAL
  Filled 2020-03-22 (×3): qty 1

## 2020-03-22 MED ORDER — FLUTICASONE FUROATE-VILANTEROL 100-25 MCG/INH IN AEPB
1.0000 | INHALATION_SPRAY | Freq: Every day | RESPIRATORY_TRACT | Status: DC
  Administered 2020-03-24 – 2020-03-25 (×2): 1 via RESPIRATORY_TRACT
  Filled 2020-03-22: qty 28

## 2020-03-22 MED ORDER — METHYLPREDNISOLONE SODIUM SUCC 125 MG IJ SOLR
125.0000 mg | Freq: Once | INTRAMUSCULAR | Status: AC
  Administered 2020-03-22: 125 mg via INTRAVENOUS
  Filled 2020-03-22: qty 2

## 2020-03-22 MED ORDER — ACETAMINOPHEN 325 MG PO TABS
650.0000 mg | ORAL_TABLET | Freq: Four times a day (QID) | ORAL | Status: DC | PRN
  Filled 2020-03-22: qty 2

## 2020-03-22 MED ORDER — POLYETHYLENE GLYCOL 3350 17 G PO PACK: 17.0000 g | PACK | ORAL | Status: DC | PRN

## 2020-03-22 MED ORDER — ONDANSETRON HCL 4 MG PO TABS: 8.0000 mg | ORAL_TABLET | Freq: Two times a day (BID) | ORAL | Status: DC | PRN

## 2020-03-22 MED ORDER — ENOXAPARIN SODIUM 40 MG/0.4ML ~~LOC~~ SOLN
40.0000 mg | SUBCUTANEOUS | Status: DC
  Administered 2020-03-22 – 2020-03-23 (×2): 40 mg via SUBCUTANEOUS
  Filled 2020-03-22 (×2): qty 0.4

## 2020-03-22 MED ORDER — BREO ELLIPTA 100-25 MCG/INH IN AEPB: 1.0000 | INHALATION_SPRAY | Freq: Every day | RESPIRATORY_TRACT | 0 refills | Status: AC

## 2020-03-22 MED ORDER — ALBUTEROL SULFATE (2.5 MG/3ML) 0.083% IN NEBU
3.0000 mL | INHALATION_SOLUTION | Freq: Four times a day (QID) | RESPIRATORY_TRACT | Status: DC | PRN
  Administered 2020-03-23 (×2): 3 mL via RESPIRATORY_TRACT
  Filled 2020-03-22 (×2): qty 3

## 2020-03-22 MED ORDER — AEROCHAMBER PLUS FLO-VU MEDIUM MISC
1.0000 | Freq: Once | Status: AC
  Administered 2020-03-22: 1

## 2020-03-22 MED ORDER — ACETAMINOPHEN 650 MG RE SUPP: 650.0000 mg | Freq: Four times a day (QID) | RECTAL | Status: DC | PRN

## 2020-03-22 MED ORDER — CLONIDINE HCL 0.2 MG PO TABS
0.3000 mg | ORAL_TABLET | Freq: Two times a day (BID) | ORAL | Status: DC
  Administered 2020-03-23 – 2020-03-25 (×5): 0.3 mg via ORAL
  Filled 2020-03-22 (×6): qty 1

## 2020-03-22 MED ORDER — IOHEXOL 350 MG/ML SOLN
75.0000 mL | Freq: Once | INTRAVENOUS | Status: AC | PRN
  Administered 2020-03-22: 75 mL via INTRAVENOUS

## 2020-03-22 NOTE — Consult Note (Signed)
NAME:  Wendy [name], MRN:  [id number], DOB:  10/02/1941, LOS: 0 ADMISSION DATE:  03/22/2020, CONSULTATION DATE: 03/22/2020 REFERRING MD: Dr. Roosevelt Locks, CHIEF COMPLAINT: Shortness of breath  Brief History   79 year old lady with metastatic breast cancer, ongoing chemotherapy Recently had thoracentesis performed for 400 cc from right pleural space She had had thoracentesis a few weeks earlier  History of present illness   Worsening shortness of breath over the last couple of days Was recently treated for pneumonia-used Levaquin Activated EMS secondary to worsening shortness of breath She is coughing, wheezing, just feels exhausted  Past Medical History   Past Medical History:  Diagnosis Date  . Abnormal breast finding 2018   per pt/ having  a lot drainage from left breast nipple  . Anemia   . Anxiety   . Breast cancer ([place]) 11/2016   left/  . CKD (chronic kidney disease)   . Depression   . GERD (gastroesophageal reflux disease)    TUMS as needed  . HTN (hypertension)    states BP has been high recently; has been on med. x 20 yr.  . Hyperlipidemia   . Hyperplastic colon polyp   . HYPERTENSION, BENIGN SYSTEMIC 11/29/2006   Lisinopril hctz 10-12.5mg , amlodipine 10mg , coreg 25mg  BID, clonidine 0.3 mg BID.   Marland Kitchen Insulin dependent diabetes mellitus   . Personal history of chemotherapy    2018/finished 6 weeks of chemo in Sep 2018  . Personal history of radiation therapy    2018 left breast/finished radiation in Sept 2018 per pt.     Significant Hospital Events   Rectal temp of 92.9  Consults:  pccm  Procedures:  None  Significant Diagnostic Tests:  Echocardiogram 02/05/2020 Ejection fraction of 60 to 65%, does show diastolic dysfunction, mild pulmonary hypertension   CT scan of the chest 03/22/2020 IMPRESSION: 1. Negative examination for pulmonary embolism. 2. Moderate right, small left pleural effusions, increased compared to prior CT dated 03/02/2020. 3. Numerous  bilateral pulmonary nodules, in keeping with metastatic breast malignancy. 4. Trace pericardial effusion, unchanged compared to prior examination. 5. Soft tissue mass in the lateral left breast and axilla, in keeping with known breast malignancy.  Micro Data:  None  Antimicrobials:  Azithromycin 6/21 Ceftriaxone 6/21  Interim history/subjective:  Shortness of breath, wheezing, just feels tired  Objective   Blood pressure 133/68, pulse 73, temperature (!) 93.8 F (34.3 C), temperature source Rectal, resp. rate (!) 25, SpO2 96 %.       No intake or output data in the 24 hours ending 03/22/20 1634 There were no vitals filed for this visit.  Examination: General: Elderly lady, increased work of breathing HENT: Moist oral mucosa Lungs: Decreased air entry bibasilarly Cardiovascular: S1-S2 appreciated Abdomen: Bowel sounds appreciated Extremities: No clubbing, no edema Neuro: Awake and alert GU:   Resolved Hospital Problem list     Assessment & Plan:  Metastatic breast cancer with recurrent pleural effusions -She does have metastases to lungs, lymph nodes, liver -Advanced disease -Incurable but treatable with oncology opinion-remains on Xeloda  Malignant pleural effusion -Thoracentesis on May 25-300 cc of fluid, June 17-400 cc of fluid -May benefit from Pleurx catheter placement -Hypothermia is concerning for an infectious process ongoing -We will give her some time to stabilize prior to placing Pleurx catheter  Hypoxemic respiratory failure Obstructive lung disease - Continue oxygen supplementation - Continue bronchodilators  Shortness of breath with bronchospasms -Bronchodilators  Pneumonia -Did receive Rocephin and azithromycin, switch to Augmentin as concern for postobstructive  pneumonia -Was recently started on Levaquin on 17th  Hypothermia -Warming blanket in place -Obtain blood cultures x2   Pericardial effusion -Echocardiogram pending at  present  Diabetes  Chronic kidney disease  Prognosis is guarded Will consider Pleurx catheter placement when more stable  Sherrilyn Rist, MD [facility] Pager: [phone]

## 2020-03-22 NOTE — ED Triage Notes (Signed)
Pt here with EMS for SOB x2 days. Fire found pt at 84% on 4L of O2 and applied a NRB. EMS switched back to [place] and she was 100% on 4L which is her baseline. Hx of fluid in the lungs that has been taken off with thoracentesis. Hx of stage 4 breast cancer that has metastasized. Ems reports expiratory wheezes.

## 2020-03-22 NOTE — ED Notes (Signed)
Wendy [name] (Daughter # [phone]) called for update.  Thank you

## 2020-03-22 NOTE — ED Notes (Signed)
Pt rectal temp 92.22F. Pt placed on [facility].

## 2020-03-22 NOTE — Telephone Encounter (Signed)
Spoke with pt's daughter, [name]. Pt is needing samples of Breo 100. Sample has been left at the front desk for pick up. Nothing further was needed.

## 2020-03-22 NOTE — H&P (Signed)
History and Physical    Wendy [name] TFT:[id number] DOB: Nov 03, 1940 DOA: 03/22/2020  PCP: Marin Olp, MD (Confirm with patient/family/NH records and if not entered, this has to be entered at [facility] point of entry) Patient coming from: Home  I have personally briefly reviewed patient's old medical records in [place]  Chief Complaint: SOB  HPI: Wendy [name] is a 79 y.o. female with medical history significant of history significant ofmetastatic breast cancer to lung bone lymph nodes and liver in April 2021, s/p chemo, recurrent right-sided malignant pleural effusion status post thoracentesis x2 on May 25 and March 18 2020, anemia, anxiety, depression, metastatic breast cancer currently on chemo, CKD, hypertension, hyperlipidemia, GERD, insulin-dependent diabetes presenting with complaints of shortness of breath.  During last maintenance admission, on 5/25, 320 cc fluid was drained. Cytology showing malignant cells consistent with metastatic adenocarcinoma.  Patient was discharged home about 2 weeks ago and was doing fine for about a week then gradually started from 1 week ago she started to feel short of breath again with little cough and wheezing.  No fever or chills, she had to to turn up her oxygen from baseline 2 L to 4 L for she became easy desaturation when ambulate.  She went to see oncologist on June 14, and on to 17th, patient underwent a right-sided thoracentesis with removal about 400 mL pleural effusion and patient was also diagnosed with bilateral pneumonia and Levaquin was started aiming at total of 7 days which patient is still taking.  Patient breathing condition however did not significant proved continue with denied any cough, no fever or chills no dysuria no diarrhea no chest pains. ED Course: CT angiogram negative for PE but recurrent right-sided moderate pleural effusion compared to 4 days ago.  Blood work WBC 8.0, creatinine 1.4, glucose 311.  Review of Systems: As per  HPI otherwise 10 point review of systems negative.    Past Medical History:  Diagnosis Date   Abnormal breast finding 2018   per pt/ having  a lot drainage from left breast nipple   Anemia    Anxiety    Breast cancer ([place]) 11/2016   left/   CKD (chronic kidney disease)    Depression    GERD (gastroesophageal reflux disease)    TUMS as needed   HTN (hypertension)    states BP has been high recently; has been on med. x 20 yr.   Hyperlipidemia    Hyperplastic colon polyp    HYPERTENSION, BENIGN SYSTEMIC 11/29/2006   Lisinopril hctz 10-12.5mg , amlodipine 10mg , coreg 25mg  BID, clonidine 0.3 mg BID.    Insulin dependent diabetes mellitus    Personal history of chemotherapy    2018/finished 6 weeks of chemo in Sep 2018   Personal history of radiation therapy    2018 left breast/finished radiation in Sept 2018 per pt.    Past Surgical History:  Procedure Laterality Date   AXILLARY LYMPH NODE DISSECTION Left 08/05/2019   Procedure: LEFT AXILLARY LYMPH NODE DISSECTION;  Surgeon: Erroll Luna, MD;  Location: [facility];  Service: General;  Laterality: Left;   BREAST LUMPECTOMY Left 05/05/2008   BREAST LUMPECTOMY Left 12/07/2016   malignant   BREAST LUMPECTOMY WITH RADIOACTIVE SEED AND SENTINEL LYMPH NODE BIOPSY Left 12/07/2016   Procedure: LEFT BREAST LUMPECTOMY WITH RADIOACTIVE SEED AND SENTINEL LYMPH NODE BIOPSY;  Surgeon: Erroll Luna, MD;  Location: [facility];  Service: General;  Laterality: Left;   IR FLUORO GUIDE PORT  INSERTION RIGHT  01/04/2017   IR THORACENTESIS ASP PLEURAL SPACE W/IMG GUIDE  03/02/2020   IR US GUIDE VASC ACCESS RIGHT  01/04/2017   PORT-A-CATH REMOVAL N/A 05/30/2017   Procedure: REMOVAL PORT-A-CATH;  Surgeon: Erroll Luna, MD;  Location: [facility];  Service: General;  Laterality: N/A;   RE-EXCISION OF BREAST LUMPECTOMY Left 12/28/2016   Procedure: RE-EXCISION OF BREAST LUMPECTOMY;  Surgeon:  Erroll Luna, MD;  Location: [place] [facility];  Service: General;  Laterality: Left;     reports that she has never smoked. She quit smokeless tobacco use about 2 years ago.  Her smokeless tobacco use included snuff. She reports that she does not drink alcohol and does not use drugs.  No Known Allergies  Family History  Problem Relation Age of Onset   Diabetes Sister        x 4   Heart failure Sister    Diabetes Brother    Heart disease Brother    Diabetes Paternal Grandmother    Diabetes Paternal Aunt    Heart attack Brother    Arthritis Sister    Hypertension Sister    Diabetes Sister    Emphysema Brother    Cancer Father 38       brain tumor    Diabetes Child    Asthma Child    Hypertension Child    Diabetes Child    Diabetes Child    Colon cancer Neg Hx    Stomach cancer Neg Hx      Prior to Admission medications   Medication Sig Start Date End Date Taking? Authorizing Provider  albuterol (VENTOLIN HFA) 108 (90 Base) MCG/ACT inhaler Inhale 2 puffs into the lungs every 6 (six) hours as needed for wheezing or shortness of breath. 10/21/19  Yes Marin Olp, MD  amLODipine (NORVASC) 10 MG tablet TAKE 1 TABLET BY MOUTH EVERY DAY Patient taking differently: Take 10 mg by mouth daily.  09/22/19  Yes Marin Olp, MD  busPIRone (BUSPAR) 10 MG tablet Take 1 tablet (10 mg total) by mouth 3 (three) times daily. 10/16/19  Yes Marin Olp, MD  carvedilol (COREG) 25 MG tablet Take 1 tablet (25 mg total) by mouth 2 (two) times daily with a meal. 10/07/19  Yes Marin Olp, MD  cloNIDine (CATAPRES) 0.3 MG tablet TAKE 1 TABLET (0.3 MG TOTAL) BY MOUTH 2 (TWO) TIMES DAILY. 02/16/20  Yes Marin Olp, MD  famotidine (PEPCID) 20 MG tablet TAKE 1 TABLET BY MOUTH EVERY DAY Patient taking differently: Take 20 mg by mouth daily.  03/09/20  Yes Mannam, Praveen, MD  fluticasone furoate-vilanterol (BREO ELLIPTA) 100-25 MCG/INH AEPB Inhale 1 puff into the lungs daily.  03/22/20  Yes Mannam, Praveen, MD  HYDROcodone-acetaminophen (NORCO/VICODIN) 5-325 MG tablet Take 1 tablet by mouth every 8 (eight) hours as needed for moderate pain. 08/20/19  Yes [name], MD  B-D INS SYRINGE 0.5CC/31GX5/16 31G X 5/16" 0.5 ML MISC USE AS DIRECTED 4 TIMES A DAY 08/18/16   Philemon Kingdom, MD  fluticasone (FLONASE) 50 MCG/ACT nasal spray Place 1 spray into both nostrils daily. 01/30/20   Martyn Ehrich, NP  Insulin Glargine (LANTUS SOLOSTAR) 100 UNIT/ML Solostar Pen Inject 28 units into the skin in am Patient taking differently: Inject 28 Units into the skin daily. If over 220 give 35U 08/11/19   Philemon Kingdom, MD  Insulin Pen Needle 31G X 5 MM MISC Use pen needles for insulin injection daily 12/12/16   Philemon Kingdom, MD  insulin regular (NOVOLIN R) 100 units/mL injection Inject 0.14-0.18 mLs (14-18 Units total) into the skin 3 (three) times daily before meals. Relion. Please provide insulin syringes needed Patient taking differently: Inject 10-16 Units into the skin See admin instructions. 16 units in the morning and 10 units at night 08/11/19   Philemon Kingdom, MD  Insulin Syringe-Needle U-100 (B-D INS SYRINGE 0.5CC/30GX1/2") 30G X 1/2" 0.5 ML MISC USE AS DIRECTED 4 TIMES A DAY 08/15/16   Philemon Kingdom, MD  levofloxacin (LEVAQUIN) 500 MG tablet Take 1 tablet (500 mg total) by mouth daily. 03/18/20   Tanner, Lyndon Code., PA-C  lidocaine-prilocaine (EMLA) cream Apply to affected area once Patient not taking: Reported on 03/09/2020 01/08/20   [name], MD  lisinopril-hydrochlorothiazide (ZESTORETIC) 20-12.5 MG tablet Take 1 tablet by mouth daily. 03/13/20   [provider]  lovastatin (MEVACOR) 40 MG tablet TAKE 1 TABLET BY MOUTH EVERYDAY AT BEDTIME Patient taking differently: Take 40 mg by mouth at bedtime.  09/22/19   Marin Olp, MD  mirtazapine (REMERON) 7.5 MG tablet Take 1 tablet (7.5 mg total) by mouth at bedtime. 01/08/20   [name], MD  montelukast  (SINGULAIR) 10 MG tablet Take 1 tablet (10 mg total) by mouth at bedtime. 01/30/20   Martyn Ehrich, NP  ondansetron (ZOFRAN) 8 MG tablet Take 1 tablet (8 mg total) by mouth 2 (two) times daily as needed (Nausea or vomiting). 01/08/20   [name], MD  polyethylene glycol powder (GLYCOLAX/MIRALAX) 17 GM/SCOOP powder Take 17 g by mouth daily. Patient taking differently: Take 17 g by mouth as needed for mild constipation or moderate constipation.  05/22/19   Pyrtle, Lajuan Lines, MD  traMADol (ULTRAM) 50 MG tablet TAKE 1 TABLET BY MOUTH EVERY 6 HOURS AS NEEDED FOR MODERATE/SEVERE PAIN (NECK AND SHOULDER PAIN). 03/08/20   [name], MD  XELODA 500 MG tablet Take 3 tablets (1,500 mg total) by mouth 2 (two) times daily after a meal. Take for 14 days, then hold for 7 days. Repeat every 21 days. 03/16/20   [name], MD    Physical Exam: Vitals:   03/22/20 1432 03/22/20 1447 03/22/20 1501 03/22/20 1502  BP:   133/68   Pulse: 74 73 76 73  Resp: (!) 24 19 (!) 28 (!) 25  Temp:      TempSrc:      SpO2: 100% 100% 96% 96%    Constitutional: NAD, calm, comfortable Vitals:   03/22/20 1432 03/22/20 1447 03/22/20 1501 03/22/20 1502  BP:   133/68   Pulse: 74 73 76 73  Resp: (!) 24 19 (!) 28 (!) 25  Temp:      TempSrc:      SpO2: 100% 100% 96% 96%   Eyes: PERRL, lids and conjunctivae normal ENMT: Mucous membranes are moist. Posterior pharynx clear of any exudate or lesions.Normal dentition.  Neck: normal, supple, no masses, no thyromegaly Respiratory: Significant decrease in breathing sound on the right side, no wheezing, no crackles.  Increasing respiratory effort. No accessory muscle use.  Cardiovascular: Regular rate and rhythm, no murmurs / rubs / gallops. 1+extremity edema. 2+ pedal pulses. No carotid bruits.  Abdomen: no tenderness, no masses palpated. No hepatosplenomegaly. Bowel sounds positive.  Musculoskeletal: no clubbing / cyanosis. No joint deformity upper and lower extremities. Good ROM, no  contractures. Normal muscle tone.  Skin: no rashes, lesions, ulcers. No induration Neurologic: CN 2-12 grossly intact. Sensation intact, DTR normal. Strength 5/5 in all 4.  Psychiatric: Normal judgment  and insight. Alert and oriented x 3. Normal mood.     Labs on Admission: I have personally reviewed following labs and imaging studies  CBC: Recent Labs  Lab 03/22/20 1200  WBC 8.0  NEUTROABS 7.5  HGB 9.9*  HCT 30.0*  MCV 89.0  PLT 947   Basic Metabolic Panel: Recent Labs  Lab 03/22/20 1200  NA 136  K 4.9  CL 99  CO2 28  GLUCOSE 311*  BUN 19  CREATININE 1.41*  CALCIUM 8.9   GFR: Estimated Creatinine Clearance: 34.5 mL/min (A) (by C-G formula based on SCr of 1.41 mg/dL (H)). Liver Function Tests: No results for input(s): AST, ALT, ALKPHOS, BILITOT, PROT, ALBUMIN in the last 168 hours. No results for input(s): LIPASE, AMYLASE in the last 168 hours. No results for input(s): AMMONIA in the last 168 hours. Coagulation Profile: No results for input(s): INR, PROTIME in the last 168 hours. Cardiac Enzymes: No results for input(s): CKTOTAL, CKMB, CKMBINDEX, TROPONINI in the last 168 hours. BNP (last 3 results) Recent Labs    09/15/19 1552  PROBNP CANCELED   HbA1C: No results for input(s): HGBA1C in the last 72 hours. CBG: No results for input(s): GLUCAP in the last 168 hours. Lipid Profile: No results for input(s): CHOL, HDL, LDLCALC, TRIG, CHOLHDL, LDLDIRECT in the last 72 hours. Thyroid Function Tests: No results for input(s): TSH, T4TOTAL, FREET4, T3FREE, THYROIDAB in the last 72 hours. Anemia Panel: No results for input(s): VITAMINB12, FOLATE, FERRITIN, TIBC, IRON, RETICCTPCT in the last 72 hours. Urine analysis:    Component Value Date/Time   COLORURINE YELLOW 03/22/2020 1226   APPEARANCEUR CLEAR 03/22/2020 1226   LABSPEC 1.010 03/22/2020 1226   PHURINE 5.0 03/22/2020 1226   GLUCOSEU >=500 (A) 03/22/2020 1226   HGBUR MODERATE (A) 03/22/2020 1226    BILIRUBINUR NEGATIVE 03/22/2020 1226   BILIRUBINUR Negative 10/07/2019 1121   KETONESUR 5 (A) 03/22/2020 1226   PROTEINUR >=300 (A) 03/22/2020 1226   UROBILINOGEN 0.2 10/07/2019 1121   NITRITE NEGATIVE 03/22/2020 1226   LEUKOCYTESUR NEGATIVE 03/22/2020 1226    Radiological Exams on Admission: CT Angio Chest PE W and/or Wo Contrast  Result Date: 03/22/2020 CLINICAL DATA:  Shortness of breath, history of metastatic breast cancer and pleural effusions EXAM: CT ANGIOGRAPHY CHEST WITH CONTRAST TECHNIQUE: Multidetector CT imaging of the chest was performed using the standard protocol during bolus administration of intravenous contrast. Multiplanar CT image reconstructions and MIPs were obtained to evaluate the vascular anatomy. CONTRAST:  41mL OMNIPAQUE IOHEXOL 350 MG/ML SOLN COMPARISON:  CT chest angiogram, 03/02/2020 FINDINGS: Cardiovascular: Satisfactory opacification of the pulmonary arteries to the segmental level. No evidence of pulmonary embolism. Normal heart size. Trace pericardial effusion. Mediastinum/Nodes: No enlarged mediastinal, hilar, or axillary lymph nodes. Thyroid gland, trachea, and esophagus demonstrate no significant findings. Lungs/Pleura: Moderate right, small left pleural effusions increased compared to prior CT dated 03/02/2020. Numerous bilateral pulmonary nodules. Upper Abdomen: No acute abnormality. Musculoskeletal: Soft tissue mass in the lateral left breast and axilla. No acute or significant osseous findings. Review of the MIP images confirms the above findings. IMPRESSION: 1. Negative examination for pulmonary embolism. 2. Moderate right, small left pleural effusions, increased compared to prior CT dated 03/02/2020. 3. Numerous bilateral pulmonary nodules, in keeping with metastatic breast malignancy. 4. Trace pericardial effusion, unchanged compared to prior examination. 5. Soft tissue mass in the lateral left breast and axilla, in keeping with known breast malignancy.  Electronically Signed   By: Eddie Candle M.D.   On: 03/22/2020 15:09  DG Chest Portable 1 View  Result Date: 03/22/2020 CLINICAL DATA:  Shortness of breath.  Metastatic breast carcinoma. EXAM: PORTABLE CHEST 1 VIEW COMPARISON:  03/18/2020 FINDINGS: Moderate cardiomegaly appears mildly increased since previous study. Increased diffuse interstitial infiltrates may be due to interstitial edema. New confluent opacity is seen in the right midlung, suspicious for pneumonia. Left lower lobe atelectasis or infiltrate shows no significant change. Probable small bilateral pleural effusions noted. Small pulmonary nodules are noted, but less well visualized than on prior exam. Multiple surgical clips again seen in the left axilla. IMPRESSION: 1. New confluent opacity in right midlung, suspicious for pneumonia. 2. Mild increase in cardiomegaly and diffuse interstitial infiltrates, suspicious for congestive heart failure. 3. Stable left lower lobe atelectasis versus infiltrate. Probable small bilateral pleural effusions. 4. Pulmonary metastases are less well visualized than on previous study. Electronically Signed   By: Marlaine Hind M.D.   On: 03/22/2020 12:03    EKG: Independently reviewed. Low voltage  Assessment/Plan Active Problems:   Respiratory failure ([place])  (please populate well all problems here in Problem List. (For example, if patient is on BP meds at home and you resume or decide to hold them, it is a problem that needs to be her. 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  We will switch her antibiotics to Augmentin for another 5 days. -Discussed with pulmonary attending regarding recurrent right-sided pleural effusion, and possible PleurX catheter given the right-sided pleural feeling grow back pretty fast.  COPD -She is not wheezing, and  that there is no increasing of cough or wheezing, will not escalate her current home breathing meds and maintenance medications  Low voltage EKG -New compared to last EKG done on June 7.  CT showed trace effusion, her echo was done about a month ago. -Will repeat Echo given the new EKG changes.  Metastatic breast cancer -Most recent office visit, oncology considered "her cancer is no longer curable but still treatable".  -Oral Xeloda was started in April.    IDDM with hyperglycemia -Lantus plus sliding scale  HTN -BP WNL, resume all home BP meds  Moderate pulmonary hypertension -PE ruled out, she may benefit from Lasix we will monitor her blood pressure and then decide  CKD stage II -Euvolemic, watch hypotension  DVT prophylaxis: Lovenox  code Status: DNR Family Communication: None at bedside Disposition Plan: Patient has complicated medical issue with meta static breast cancer to the right side along with malignant pleural effusion recurrent, will need PleurX procedure likely will need more than 2 midnight hospital stay to improve oxyenation. Consults called: Pulm Admission status: Tele   Lequita Halt MD [facility] Pager [phone]    03/22/2020, 4:12 PM

## 2020-03-22 NOTE — H&P (View-Only) (Signed)
NAME:  Wendy [name], MRN:  [id number], DOB:  1941-06-05, LOS: 0 ADMISSION DATE:  03/22/2020, CONSULTATION DATE: 03/22/2020 REFERRING MD: Dr. Roosevelt Locks, CHIEF COMPLAINT: Shortness of breath  Brief History   79 year old lady with metastatic breast cancer, ongoing chemotherapy Recently had thoracentesis performed for 400 cc from right pleural space She had had thoracentesis a few weeks earlier  History of present illness   Worsening shortness of breath over the last couple of days Was recently treated for pneumonia-used Levaquin Activated EMS secondary to worsening shortness of breath She is coughing, wheezing, just feels exhausted  Past Medical History   Past Medical History:  Diagnosis Date  . Abnormal breast finding 2018   per pt/ having  a lot drainage from left breast nipple  . Anemia   . Anxiety   . Breast cancer ([place]) 11/2016   left/  . CKD (chronic kidney disease)   . Depression   . GERD (gastroesophageal reflux disease)    TUMS as needed  . HTN (hypertension)    states BP has been high recently; has been on med. x 20 yr.  . Hyperlipidemia   . Hyperplastic colon polyp   . HYPERTENSION, BENIGN SYSTEMIC 11/29/2006   Lisinopril hctz 10-12.5mg , amlodipine 10mg , coreg 25mg  BID, clonidine 0.3 mg BID.   Marland Kitchen Insulin dependent diabetes mellitus   . Personal history of chemotherapy    2018/finished 6 weeks of chemo in Sep 2018  . Personal history of radiation therapy    2018 left breast/finished radiation in Sept 2018 per pt.     Significant Hospital Events   Rectal temp of 92.9  Consults:  pccm  Procedures:  None  Significant Diagnostic Tests:  Echocardiogram 02/05/2020 Ejection fraction of 60 to 65%, does show diastolic dysfunction, mild pulmonary hypertension   CT scan of the chest 03/22/2020 IMPRESSION: 1. Negative examination for pulmonary embolism. 2. Moderate right, small left pleural effusions, increased compared to prior CT dated 03/02/2020. 3. Numerous  bilateral pulmonary nodules, in keeping with metastatic breast malignancy. 4. Trace pericardial effusion, unchanged compared to prior examination. 5. Soft tissue mass in the lateral left breast and axilla, in keeping with known breast malignancy.  Micro Data:  None  Antimicrobials:  Azithromycin 6/21 Ceftriaxone 6/21  Interim history/subjective:  Shortness of breath, wheezing, just feels tired  Objective   Blood pressure 133/68, pulse 73, temperature (!) 93.8 F (34.3 C), temperature source Rectal, resp. rate (!) 25, SpO2 96 %.       No intake or output data in the 24 hours ending 03/22/20 1634 There were no vitals filed for this visit.  Examination: General: Elderly lady, increased work of breathing HENT: Moist oral mucosa Lungs: Decreased air entry bibasilarly Cardiovascular: S1-S2 appreciated Abdomen: Bowel sounds appreciated Extremities: No clubbing, no edema Neuro: Awake and alert GU:   Resolved Hospital Problem list     Assessment & Plan:  Metastatic breast cancer with recurrent pleural effusions -She does have metastases to lungs, lymph nodes, liver -Advanced disease -Incurable but treatable with oncology opinion-remains on Xeloda  Malignant pleural effusion -Thoracentesis on May 25-300 cc of fluid, June 17-400 cc of fluid -May benefit from Pleurx catheter placement -Hypothermia is concerning for an infectious process ongoing -We will give her some time to stabilize prior to placing Pleurx catheter  Hypoxemic respiratory failure Obstructive lung disease - Continue oxygen supplementation - Continue bronchodilators  Shortness of breath with bronchospasms -Bronchodilators  Pneumonia -Did receive Rocephin and azithromycin, switch to Augmentin as concern for postobstructive  pneumonia -Was recently started on Levaquin on 17th  Hypothermia -Warming blanket in place -Obtain blood cultures x2   Pericardial effusion -Echocardiogram pending at  present  Diabetes  Chronic kidney disease  Prognosis is guarded Will consider Pleurx catheter placement when more stable  Sherrilyn Rist, MD [facility] Pager: [phone]

## 2020-03-22 NOTE — ED Provider Notes (Signed)
[name] Provider Note   CSN: [id number] Arrival date & time: 03/22/20  1059     History Chief Complaint  Patient presents with  . Shortness of Breath    W[name] is a 79 y.o. female with a past medical history significant for anemia, anxiety, metastatic breast cancer, CKD, depression, GERD, hypertension, and hyperlipidemia who presents to the ED due to worsening shortness of breath for the past 2 weeks.  Patient is currently being treated for bilateral pneumonia which was diagnosed on 6/17.  She admits to being compliant with Levaquin.  Shortness of breath associated with wheezing. She has a history of asthma. She has tried her albuterol inhaler with moderate relief.  She is currently on 4 L nasal cannula at home.  Shortness of breath associated with central chest pain which she describes as fullness and soreness which radiates to her back.  Patient notes chest pain is constant in nature.  Patient has a history of previous thoracentesis with the last one being on 6/17 where 4 mL of fluid was pulled off her right lung.  Patient denies fever, but admits to chills.  Denies sick contacts and Covid exposures.  Patient is currently being treated for metastatic breast cancer.  Denies abdominal pain, lower extremity edema, nausea, vomiting, diarrhea.  Upon EMS arrival, patient found to be hypoxic at 84% on 4L [place] and was switched to [facility]. EMS then switched her back to [place] where she maintained her O2 saturation at 100% on 4L.   History obtained from patient and past medical records. No interpreter used during encounter.      Past Medical History:  Diagnosis Date  . Abnormal breast finding 2018   per pt/ having  a lot drainage from left breast nipple  . Anemia   . Anxiety   . Breast cancer ([place]) 11/2016   left/  . CKD (chronic kidney disease)   . Depression   . GERD (gastroesophageal reflux disease)    TUMS as needed  . HTN (hypertension)    states  BP has been high recently; has been on med. x 20 yr.  . Hyperlipidemia   . Hyperplastic colon polyp   . HYPERTENSION, BENIGN SYSTEMIC 11/29/2006   Lisinopril hctz 10-12.5mg , amlodipine 10mg , coreg 25mg  BID, clonidine 0.3 mg BID.   Marland Kitchen Insulin dependent diabetes mellitus   . Personal history of chemotherapy    2018/finished 6 weeks of chemo in Sep 2018  . Personal history of radiation therapy    2018 left breast/finished radiation in Sept 2018 per pt.    Patient Active Problem List   Diagnosis Date Noted  . Palliative care by specialist   . 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 Metastatic breast cancer ([place]) 03/02/2020  . Advanced care planning/counseling discussion   . Moderate persistent asthma 01/31/2020  . Allergic rhinitis 01/31/2020  . GERD (gastroesophageal reflux disease) 01/31/2020  . Goals of care, counseling/discussion 01/08/2020  . Lymphedema of left arm 09/03/2019  . 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[place]) 12/05/2018  . Grief 05/10/2018  . Anemia 01/09/2018  . Fatigue 03/14/2017  . Edema 03/14/2017  . Shortness of breath 03/14/2017  . Port catheter in place 01/18/2017  . Rectal bleeding 01/17/2017  . Constipation 01/17/2017  . Malignant neoplasm of upper-outer quadrant of left breast in female, estrogen receptor positive ([place]) 11/20/2016  . CKD (chronic kidney disease) stage 3, GFR 30-59 ml/min ([facility]) 03/17/2016  . 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 Seasonal allergies 12/01/2013  . Diastolic dysfunction 95/06/3266  . TOBACCO USE 04/13/2008  . Hyperlipidemia associated with type 2 diabetes mellitus ([place]) 11/29/2006  . OBESITY, BMI 30-35 11/29/2006  . Hypertension associated with diabetes ([place]) 11/29/2006    Past Surgical History:  Procedure Laterality Date  . AXILLARY LYMPH NODE DISSECTION Left 08/05/2019   Procedure: LEFT AXILLARY LYMPH NODE  DISSECTION;  Surgeon: Erroll Luna, MD;  Location: [facility];  Service: General;  Laterality: Left;  . BREAST LUMPECTOMY Left 05/05/2008  . BREAST LUMPECTOMY Left 12/07/2016   malignant  . BREAST LUMPECTOMY WITH RADIOACTIVE SEED AND SENTINEL LYMPH NODE BIOPSY Left 12/07/2016   Procedure: LEFT BREAST LUMPECTOMY WITH RADIOACTIVE SEED AND SENTINEL LYMPH NODE BIOPSY;  Surgeon: Erroll Luna, MD;  Location: [facility];  Service: General;  Laterality: Left;  . IR FLUORO GUIDE PORT INSERTION RIGHT  01/04/2017  . IR THORACENTESIS ASP PLEURAL SPACE W/IMG GUIDE  03/02/2020  . IR US GUIDE VASC ACCESS RIGHT  01/04/2017  . PORT-A-CATH REMOVAL N/A 05/30/2017   Procedure: REMOVAL PORT-A-CATH;  Surgeon: Erroll Luna, MD;  Location: [facility];  Service: General;  Laterality: N/A;  . RE-EXCISION OF BREAST LUMPECTOMY Left 12/28/2016   Procedure: RE-EXCISION OF BREAST LUMPECTOMY;  Surgeon: Erroll Luna, MD;  Location: [facility];  Service: General;  Laterality: Left;     OB History   No obstetric history on file.     Family History  Problem Relation Age of Onset  . Diabetes Sister        x 4  . Heart failure Sister   . Diabetes Brother   . Heart disease Brother   . Diabetes Paternal Grandmother   . Diabetes Paternal Aunt   . Heart attack Brother   . Arthritis Sister   . Hypertension Sister   . Diabetes Sister   . Emphysema Brother   . Cancer Father 31       brain tumor   . Diabetes Child   . Asthma Child   . Hypertension Child   . Diabetes Child   . Diabetes Child   . Colon cancer Neg Hx   . Stomach cancer Neg Hx     Social History   Tobacco Use  . Smoking status: Never Smoker  . Smokeless tobacco: Former Systems developer    Types: Snuff  Vaping Use  . Vaping Use: Never used  Substance Use Topics  . Alcohol use: No    Alcohol/week: 0.0 standard drinks  . Drug use: No    Home Medications Prior to Admission medications   Medication Sig Start Date  End Date Taking? Authorizing Provider  albuterol (VENTOLIN HFA) 108 (90 Base) MCG/ACT inhaler Inhale 2 puffs into the lungs every 6 (six) hours as needed for wheezing or shortness of breath. 10/21/19   Marin Olp, MD  amLODipine (NORVASC) 10 MG tablet TAKE 1 TABLET BY MOUTH EVERY DAY Patient taking differently: Take 10 mg by mouth daily.  09/22/19   Marin Olp, MD  B-D INS SYRINGE 0.5CC/31GX5/16 31G X 5/16" 0.5 ML MISC USE AS DIRECTED 4 TIMES A DAY 08/18/16   Philemon Kingdom, MD  busPIRone (BUSPAR) 10 MG tablet Take 1 tablet (10 mg total) by mouth 3 (three) times daily. 10/16/19   Marin Olp, MD  carvedilol (COREG) 25 MG tablet Take 1 tablet (25 mg total) by mouth 2 (two) times daily with  a meal. 10/07/19   Marin Olp, MD  cloNIDine (CATAPRES) 0.3 MG tablet TAKE 1 TABLET (0.3 MG TOTAL) BY MOUTH 2 (TWO) TIMES DAILY. 02/16/20   Marin Olp, MD  famotidine (PEPCID) 20 MG tablet TAKE 1 TABLET BY MOUTH EVERY DAY 03/09/20   Mannam, Praveen, MD  fluticasone (FLONASE) 50 MCG/ACT nasal spray Place 1 spray into both nostrils daily. 01/30/20   Martyn Ehrich, NP  fluticasone furoate-vilanterol (BREO ELLIPTA) 100-25 MCG/INH AEPB Inhale 1 puff into the lungs daily. 03/22/20   Mannam, Hart Robinsons, MD  HYDROcodone-acetaminophen (NORCO/VICODIN) 5-325 MG tablet Take 1 tablet by mouth every 8 (eight) hours as needed for moderate pain. 08/20/19   [name], MD  Insulin Glargine (LANTUS SOLOSTAR) 100 UNIT/ML Solostar Pen Inject 28 units into the skin in am Patient taking differently: Inject 28 Units into the skin daily. If over 220 give 35U 08/11/19   Philemon Kingdom, MD  Insulin Pen Needle 31G X 5 MM MISC Use pen needles for insulin injection daily 12/12/16   Philemon Kingdom, MD  insulin regular (NOVOLIN R) 100 units/mL injection Inject 0.14-0.18 mLs (14-18 Units total) into the skin 3 (three) times daily before meals. [place]. Please provide insulin syringes needed Patient taking  differently: Inject 10-16 Units into the skin See admin instructions. 16 units in the morning and 10 units at night 08/11/19   Philemon Kingdom, MD  Insulin Syringe-Needle U-100 (B-D INS SYRINGE 0.5CC/30GX1/2") 30G X 1/2" 0.5 ML MISC USE AS DIRECTED 4 TIMES A DAY 08/15/16   Philemon Kingdom, MD  levofloxacin (LEVAQUIN) 500 MG tablet Take 1 tablet (500 mg total) by mouth daily. 03/18/20   Tanner, Lyndon Code., PA-C  lidocaine-prilocaine (EMLA) cream Apply to affected area once Patient not taking: Reported on 03/09/2020 01/08/20   [name], MD  lisinopril-hydrochlorothiazide (ZESTORETIC) 20-12.5 MG tablet Take 1 tablet by mouth daily. 03/13/20   [provider]  lovastatin (MEVACOR) 40 MG tablet TAKE 1 TABLET BY MOUTH EVERYDAY AT BEDTIME Patient taking differently: Take 40 mg by mouth at bedtime.  09/22/19   Marin Olp, MD  mirtazapine (REMERON) 7.5 MG tablet Take 1 tablet (7.5 mg total) by mouth at bedtime. 01/08/20   [name], MD  montelukast (SINGULAIR) 10 MG tablet Take 1 tablet (10 mg total) by mouth at bedtime. 01/30/20   Martyn Ehrich, NP  ondansetron (ZOFRAN) 8 MG tablet Take 1 tablet (8 mg total) by mouth 2 (two) times daily as needed (Nausea or vomiting). 01/08/20   [name], MD  polyethylene glycol powder (GLYCOLAX/MIRALAX) 17 GM/SCOOP powder Take 17 g by mouth daily. Patient taking differently: Take 17 g by mouth as needed for mild constipation or moderate constipation.  05/22/19   Pyrtle, Lajuan Lines, MD  traMADol (ULTRAM) 50 MG tablet TAKE 1 TABLET BY MOUTH EVERY 6 HOURS AS NEEDED FOR MODERATE/SEVERE PAIN (NECK AND SHOULDER PAIN). 03/08/20   [name], MD  XELODA 500 MG tablet Take 3 tablets (1,500 mg total) by mouth 2 (two) times daily after a meal. Take for 14 days, then hold for 7 days. Repeat every 21 days. 03/16/20   [name], MD    Allergies    Patient has no known allergies.  Review of Systems   Review of Systems  Constitutional: Positive for chills. Negative for fever.    Respiratory: Positive for shortness of breath.   Cardiovascular: Positive for chest pain. Negative for leg swelling.  Gastrointestinal: Negative for abdominal pain, diarrhea, nausea and vomiting.  Musculoskeletal:  Positive for back pain.  All other systems reviewed and are negative.   Physical Exam Updated Vital Signs BP 133/68   Pulse 73   Temp (!) 93.8 F (34.3 C) (Rectal)   Resp (!) 25   LMP  (LMP Unknown)   SpO2 96%   Physical Exam Vitals and nursing note reviewed.  Constitutional:      General: She is not in acute distress. HENT:     Head: Normocephalic.  Eyes:     Pupils: Pupils are equal, round, and reactive to light.  Cardiovascular:     Rate and Rhythm: Normal rate and regular rhythm.     Pulses: Normal pulses.     Heart sounds: Normal heart sounds. No murmur heard.  No friction rub. No gallop.   Pulmonary:     Comments: Tachypneic at 22.  Patient on 4 L nasal cannula.  Decreased air movement throughout.  Positive wheeze.  Bibasilar crackles. Chest:     Comments: Reproducible central anterior chest wall tenderness.  No crepitus or deformity. Abdominal:     General: Abdomen is flat. Bowel sounds are normal. There is no distension.     Palpations: Abdomen is soft.     Tenderness: There is no abdominal tenderness. There is no guarding or rebound.  Musculoskeletal:     Cervical back: Neck supple.     Comments: No lower extremity edema.  Negative Homan sign bilaterally.  Negative calf tenderness bilaterally.  Skin:    General: Skin is warm and dry.  Neurological:     General: No focal deficit present.  Psychiatric:        Mood and Affect: Mood normal.        Behavior: Behavior normal.     ED Results / Procedures / Treatments   Labs (all labs ordered are listed, but only abnormal results are displayed) Labs Reviewed  CBC WITH DIFFERENTIAL/PLATELET - Abnormal; Notable for the following components:      Result Value   RBC 3.37 (*)    Hemoglobin 9.9 (*)     HCT 30.0 (*)    RDW 17.1 (*)    Lymphs Abs 0.3 (*)    All other components within normal limits  BASIC METABOLIC PANEL - Abnormal; Notable for the following components:   Glucose, Bld 311 (*)    Creatinine, Ser 1.41 (*)    GFR calc non Af Amer 36 (*)    GFR calc Af Amer 41 (*)    All other components within normal limits  URINALYSIS, ROUTINE W REFLEX MICROSCOPIC - Abnormal; Notable for the following components:   Glucose, UA >=500 (*)    Hgb urine dipstick MODERATE (*)    Ketones, ur 5 (*)    Protein, ur >=300 (*)    Bacteria, UA FEW (*)    All other components within normal limits  BRAIN NATRIURETIC PEPTIDE - Abnormal; Notable for the following components:   B Natriuretic Peptide 120.3 (*)    All other components within normal limits  URINE CULTURE  SARS CORONAVIRUS 2 BY RT PCR (HOSPITAL ORDER, [facility])  TROPONIN I (HIGH SENSITIVITY)  TROPONIN I (HIGH SENSITIVITY)    EKG None  Radiology CT Angio Chest PE W and/or Wo Contrast  Result Date: 03/22/2020 CLINICAL DATA:  Shortness of breath, history of metastatic breast cancer and pleural effusions EXAM: CT ANGIOGRAPHY CHEST WITH CONTRAST TECHNIQUE: Multidetector CT imaging of the chest was performed using the standard protocol during bolus administration of intravenous  contrast. Multiplanar CT image reconstructions and MIPs were obtained to evaluate the vascular anatomy. CONTRAST:  47mL OMNIPAQUE IOHEXOL 350 MG/ML SOLN COMPARISON:  CT chest angiogram, 03/02/2020 FINDINGS: Cardiovascular: Satisfactory opacification of the pulmonary arteries to the segmental level. No evidence of pulmonary embolism. Normal heart size. Trace pericardial effusion. Mediastinum/Nodes: No enlarged mediastinal, hilar, or axillary lymph nodes. Thyroid gland, trachea, and esophagus demonstrate no significant findings. Lungs/Pleura: Moderate right, small left pleural effusions increased compared to prior CT dated 03/02/2020. Numerous  bilateral pulmonary nodules. Upper Abdomen: No acute abnormality. Musculoskeletal: Soft tissue mass in the lateral left breast and axilla. No acute or significant osseous findings. Review of the MIP images confirms the above findings. IMPRESSION: 1. Negative examination for pulmonary embolism. 2. Moderate right, small left pleural effusions, increased compared to prior CT dated 03/02/2020. 3. Numerous bilateral pulmonary nodules, in keeping with metastatic breast malignancy. 4. Trace pericardial effusion, unchanged compared to prior examination. 5. Soft tissue mass in the lateral left breast and axilla, in keeping with known breast malignancy. Electronically Signed   By: Eddie Candle M.D.   On: 03/22/2020 15:09   DG Chest Portable 1 View  Result Date: 03/22/2020 CLINICAL DATA:  Shortness of breath.  Metastatic breast carcinoma. EXAM: PORTABLE CHEST 1 VIEW COMPARISON:  03/18/2020 FINDINGS: Moderate cardiomegaly appears mildly increased since previous study. Increased diffuse interstitial infiltrates may be due to interstitial edema. New confluent opacity is seen in the right midlung, suspicious for pneumonia. Left lower lobe atelectasis or infiltrate shows no significant change. Probable small bilateral pleural effusions noted. Small pulmonary nodules are noted, but less well visualized than on prior exam. Multiple surgical clips again seen in the left axilla. IMPRESSION: 1. New confluent opacity in right midlung, suspicious for pneumonia. 2. Mild increase in cardiomegaly and diffuse interstitial infiltrates, suspicious for congestive heart failure. 3. Stable left lower lobe atelectasis versus infiltrate. Probable small bilateral pleural effusions. 4. Pulmonary metastases are less well visualized than on previous study. Electronically Signed   By: Marlaine Hind M.D.   On: 03/22/2020 12:03    Procedures .Critical Care Performed by: Suzy Bouchard, PA-C Authorized by: Suzy Bouchard, PA-C    Critical care provider statement:    Critical care time (minutes):  45   Critical care time was exclusive of:  Separately billable procedures and treating other patients and teaching time   Critical care was necessary to treat or prevent imminent or life-threatening deterioration of the following conditions:  Respiratory failure   Critical care was time spent personally by me on the following activities:  Discussions with consultants, evaluation of patient's response to treatment, examination of patient, ordering and performing treatments and interventions, ordering and review of laboratory studies, ordering and review of radiographic studies, pulse oximetry, re-evaluation of patient's condition, obtaining history from patient or surrogate and review of old charts   I assumed direction of critical care for this patient from another provider in my specialty: no     (including critical care time)  Medications Ordered in ED Medications  methylPREDNISolone sodium succinate (SOLU-MEDROL) 125 mg/2 mL injection 125 mg (125 mg Intravenous Given 03/22/20 1223)  albuterol (VENTOLIN HFA) 108 (90 Base) MCG/ACT inhaler 4 puff (4 puffs Inhalation Given 03/22/20 1201)  AeroChamber Plus Flo-Vu Medium MISC 1 each (1 each Other Given 03/22/20 1201)  AeroChamber Plus Flo-Vu Large MISC (1 each  Given 03/22/20 1201)  cefTRIAXone (ROCEPHIN) 1 g in sodium chloride 0.9 % 100 mL IVPB (0 g Intravenous Stopped 03/22/20 1400)  azithromycin (ZITHROMAX) 500 mg in sodium chloride 0.9 % 250 mL IVPB (0 mg Intravenous Stopped 03/22/20 1436)  iohexol (OMNIPAQUE) 350 MG/ML injection 75 mL (75 mLs Intravenous Contrast Given 03/22/20 1441)    ED Course  I have reviewed the triage vital signs and the nursing notes.  Pertinent labs & imaging results that were available during my care of the patient were reviewed by me and considered in my medical decision making (see chart for details).  Clinical Course as of Mar 22 1538  Mon Mar 22, 2020  1234 Bear hugger placed given patient's temperature  Temp(!): 92.9 F (33.8 C) [CA]  1242 Reassessed patient at bedside given one hypotensive BP reading. Patient's BP 147/117. Unclear if hypotensive reading was accurate or not. Will hold on code sepsis at this time. Will consult pharmacy to discuss antibiotic options given patient is already on Levaquin. Discussed with Dr. Sherry Ruffing about calling code sepsis who agrees with above plan.    [MV]  6720 Discussed case with Apolonio Schneiders in pharmacy who recommends the preferred CAP treatment. Rocephin and Azithromycin ordered.    [CA]  9470 Updated patient's daughter, Estanislado Spire, on patient's results.    [CA]  9628 Will hold IVFs at this time given patient's signs of fluid overload on CXR. Discussed with Dr. Sherry Ruffing who agrees with plan.    [CA]  1355 B Natriuretic Peptide(!): 120.3 [CA]    Clinical Course User Index [CA] Suzy Bouchard, PA-C   MDM Rules/Calculators/A&P                         79 year old female presents to the ED due to worsening shortness of breath for the past 2 weeks.  Patient has a history of metastatic breast cancer is currently being treated for bilateral pneumonia.  Patient has been compliant with her Levaquin.  Patient is normally on 4 L nasal cannula at home.  During my initial evaluation, patient tachypneic. Patient currently on 4 L nasal cannula with O2 saturation in the upper 90s.  Physical exam significant for decreased air movement, wheeze, and bibasilar crackles.  Patient has a history of asthma and has tried her albuterol inhaler with moderate relief.  No lower extremity edema or calf tenderness to suggest DVT.  Reproducible anterior chest wall tenderness without crepitus or deformity.  Will obtain routine labs and troponin to rule out ACS given patient's chest pain.  Will obtain chest x-ray to evaluated for worsening PNA.  Will also obtain CTA to rule out PE given patient's history of cancer.  Suspect symptoms  related to asthma exacerbation vs. worsening pneumonia vs. pleural effusion.  Will give albuterol and Solu-Medrol. discussed case with Dr. Sherry Ruffing who evaluated patient at bedside and agrees with assessment and plan.  12:35 PM informed that patient's temperature is 92.9 F (temperature not taking initially at triage). Bear hugger placed. See notes above.   CXR personally reviewed which demonstrates: IMPRESSION:  1. New confluent opacity in right midlung, suspicious for pneumonia.  2. Mild increase in cardiomegaly and diffuse interstitial  infiltrates, suspicious for congestive heart failure.  3. Stable left lower lobe atelectasis versus infiltrate. Probable  small bilateral pleural effusions.  4. Pulmonary metastases are less well visualized than on previous  study.   Discussed case with Apolonio Schneiders in pharmacy in regards to antibiotics. See note above. IV antibiotics initiated.   EKG personally reviewed which demonstrates normal sinus rhythm with no signs of acute ischemia.  CBC reassuring with  no leukocytosis with slight anemia with hemoglobin at 9.9 which appears around patient's baseline.  BMP significant for elevation creatinine at 1.41 and hyperglycemia at 311 with no anion gap.  Doubt DKA.  Initial troponin 12.  BNP mildly elevated at 120.  UA significant for glucose urea, moderate hematuria, and proteinuria.  No signs of infection.  CTA personally reviewed which demonstrates: IMPRESSION:  1. Negative examination for pulmonary embolism.  2. Moderate right, small left pleural effusions, increased compared  to prior CT dated 03/02/2020.  3. Numerous bilateral pulmonary nodules, in keeping with metastatic  breast malignancy.  4. Trace pericardial effusion, unchanged compared to prior  examination.  5. Soft tissue mass in the lateral left breast and axilla, in  keeping with known breast malignancy.     Discussed case with Zhang with [facility] who agrees to admit patient for further treatment  of worsening PNA and hypoxia.  Final Clinical Impression(s) / ED Diagnoses Final diagnoses:  Community acquired pneumonia, unspecified laterality  Respiratory distress    Rx / DC Orders ED Discharge Orders    None       Karie Kirks 03/22/20 1539    Tegeler, Gwenyth Allegra, MD 03/23/20 1616

## 2020-03-23 ENCOUNTER — Inpatient Hospital Stay: Payer: Medicare Other

## 2020-03-23 ENCOUNTER — Inpatient Hospital Stay (HOSPITAL_COMMUNITY): Payer: Medicare Other

## 2020-03-23 DIAGNOSIS — I3131 Malignant pericardial effusion in diseases classified elsewhere: Secondary | ICD-10-CM | POA: Diagnosis present

## 2020-03-23 DIAGNOSIS — N183 Chronic kidney disease, stage 3 unspecified: Secondary | ICD-10-CM

## 2020-03-23 DIAGNOSIS — I313 Pericardial effusion (noninflammatory): Secondary | ICD-10-CM

## 2020-03-23 LAB — CBC
HCT: 31.9 % — ABNORMAL LOW (ref 36.0–46.0)
Hemoglobin: 10.8 g/dL — ABNORMAL LOW (ref 12.0–15.0)
MCH: 29.7 pg (ref 26.0–34.0)
MCHC: 33.9 g/dL (ref 30.0–36.0)
MCV: 87.6 fL (ref 80.0–100.0)
Platelets: 321 10*3/uL (ref 150–400)
RBC: 3.64 MIL/uL — ABNORMAL LOW (ref 3.87–5.11)
RDW: 17 % — ABNORMAL HIGH (ref 11.5–15.5)
WBC: 7.4 10*3/uL (ref 4.0–10.5)
nRBC: 0 % (ref 0.0–0.2)

## 2020-03-23 LAB — BLOOD GAS, ARTERIAL
Acid-Base Excess: 3.6 mmol/L — ABNORMAL HIGH (ref 0.0–2.0)
Bicarbonate: 28.4 mmol/L — ABNORMAL HIGH (ref 20.0–28.0)
FIO2: 40
O2 Saturation: 71.6 %
Patient temperature: 36.9
pCO2 arterial: 48.5 mmHg — ABNORMAL HIGH (ref 32.0–48.0)
pH, Arterial: 7.384 (ref 7.350–7.450)
pO2, Arterial: 38.7 mmHg — CL (ref 83.0–108.0)

## 2020-03-23 LAB — BASIC METABOLIC PANEL
Anion gap: 12 (ref 5–15)
BUN: 23 mg/dL (ref 8–23)
CO2: 25 mmol/L (ref 22–32)
Calcium: 9.2 mg/dL (ref 8.9–10.3)
Chloride: 99 mmol/L (ref 98–111)
Creatinine, Ser: 1.54 mg/dL — ABNORMAL HIGH (ref 0.44–1.00)
GFR calc Af Amer: 37 mL/min — ABNORMAL LOW (ref >60)
GFR calc non Af Amer: 32 mL/min — ABNORMAL LOW (ref >60)
Glucose, Bld: 213 mg/dL — ABNORMAL HIGH (ref 70–99)
Potassium: 4.6 mmol/L (ref 3.5–5.1)
Sodium: 136 mmol/L (ref 135–145)

## 2020-03-23 LAB — URINE CULTURE: Culture: NO GROWTH

## 2020-03-23 LAB — GLUCOSE, CAPILLARY
Glucose-Capillary: 308 mg/dL — ABNORMAL HIGH (ref 70–99)
Glucose-Capillary: 289 mg/dL — ABNORMAL HIGH (ref 70–99)
Glucose-Capillary: 233 mg/dL — ABNORMAL HIGH (ref 70–99)

## 2020-03-23 LAB — CBG MONITORING, ED: Glucose-Capillary: 265 mg/dL — ABNORMAL HIGH (ref 70–99)

## 2020-03-23 LAB — ECHOCARDIOGRAM LIMITED

## 2020-03-23 MED ORDER — PERFLUTREN LIPID MICROSPHERE
1.0000 mL | INTRAVENOUS | Status: AC | PRN
  Administered 2020-03-23: 1 mL via INTRAVENOUS
  Filled 2020-03-23: qty 10

## 2020-03-23 MED ORDER — IPRATROPIUM-ALBUTEROL 0.5-2.5 (3) MG/3ML IN SOLN
3.0000 mL | Freq: Three times a day (TID) | RESPIRATORY_TRACT | Status: DC
  Administered 2020-03-24 – 2020-03-25 (×5): 3 mL via RESPIRATORY_TRACT
  Filled 2020-03-23 (×5): qty 3

## 2020-03-23 MED ORDER — IPRATROPIUM-ALBUTEROL 0.5-2.5 (3) MG/3ML IN SOLN
3.0000 mL | Freq: Four times a day (QID) | RESPIRATORY_TRACT | Status: DC
  Administered 2020-03-23 (×2): 3 mL via RESPIRATORY_TRACT
  Filled 2020-03-23 (×2): qty 3

## 2020-03-23 NOTE — Progress Notes (Signed)
°  Echocardiogram 2D Echocardiogram limited with color and doppler has been performed.  W[name] M 03/23/2020, 3:33 PM

## 2020-03-23 NOTE — Evaluation (Signed)
Physical Therapy Evaluation Patient Details Name: Wendy [name] MRN: [id number] DOB: [birth year] Today's Date: 03/23/2020   History of Present Illness  Wendy [name] is a 79 y.o. female with medical history significant of history significant of metastatic breast cancer to lung bone lymph nodes and liver in April 2021, s/p chemo, recurrent right-sided malignant pleural effusion status post thoracentesis x2 on May 25 and March 18 2020, anemia, anxiety, depression, metastatic breast cancer currently on chemo, CKD, hypertension, hyperlipidemia, GERD, insulin-dependent diabetes presenting with complaints of shortness of breath.  She was treated for pneumonia as outpatient and had thoracentesis on 6/17, but continued with SOB.  Clinical Impression  Patient presents with decreased mobility due to decreased activity tolerance, decreased strength, decreased balance and she will benefit from skilled PT in the acute setting to allow return home with family support and follow up HHPT.  Patient currently min A for ambulation and requiring assist at home as well.  Hopes to get an aide for home for when son at work.      Follow Up Recommendations Home health PT;Supervision for mobility/OOB;Supervision/Assistance - 24 hour    Equipment Recommendations  Rolling walker with 5" wheels    Recommendations for Other Services       Precautions / Restrictions Precautions Precautions: Fall Precaution Comments: O2 dependent, watch SpO2      Mobility  Bed Mobility Overal bed mobility: Needs Assistance Bed Mobility: Supine to Sit;Sit to Supine     Supine to sit: Min assist;HOB elevated Sit to supine: Min guard;HOB elevated   General bed mobility comments: assisted to lift trunk to sit, to supine, assist for positioning  Transfers Overall transfer level: Needs assistance Equipment used: Rolling walker (2 wheeled) Transfers: Sit to/from Stand Sit to Stand: Min assist         General transfer comment: up  from EOB and assist for safety to sit due to fatigue  Ambulation/Gait Ambulation/Gait assistance: Min guard Gait Distance (Feet): 40 Feet Assistive device: Rolling walker (2 wheeled) Gait Pattern/deviations: Step-through pattern;Step-to pattern;Decreased stride length;Wide base of support     General Gait Details: assist for balance, safety and monitored O2 throughout  Stairs            Wheelchair Mobility    Modified Rankin (Stroke Patients Only)       Balance Overall balance assessment: Needs assistance   Sitting balance-Leahy Scale: Fair     Standing balance support: Bilateral upper extremity supported Standing balance-Leahy Scale: Poor Standing balance comment: UE support for balance in standing                             Pertinent Vitals/Pain Pain Assessment: No/denies pain    Home Living Family/patient expects to be discharged to:: Private residence Living Arrangements: Children Available Help at Discharge: Family;Available 24 hours/day Type of Home: House         Home Equipment: Shower seat;Hand held shower head;Grab bars - tub/shower;Bedside commode;Walker - 2 wheels;Other (comment) (home oxygen)      Prior Function Level of Independence: Needs assistance   Gait / Transfers Assistance Needed: son assists with walker to bathroom  ADL's / Homemaking Assistance Needed: son assists to step over edge of tub  Comments: has started process for aide with social worker     Hand Dominance   Dominant Hand: Right    Extremity/Trunk Assessment   Upper Extremity Assessment Upper Extremity Assessment: LUE deficits/detail LUE Deficits / Details:  lymphedema with weakness in biceps, to ER and to use wrist    Lower Extremity Assessment Lower Extremity Assessment: Generalized weakness    Cervical / Trunk Assessment Cervical / Trunk Assessment: Kyphotic  Communication   Communication: No difficulties  Cognition Arousal/Alertness:  Awake/alert Behavior During Therapy: WFL for tasks assessed/performed Overall Cognitive Status: Within Functional Limits for tasks assessed                                        General Comments General comments (skin integrity, edema, etc.): SpO2 90% with significant exertional dyspnea on 4L O2 throughout, RN aware    Exercises     Assessment/Plan    PT Assessment Patient needs continued PT services  PT Problem List Decreased strength;Decreased mobility;Decreased activity tolerance;Decreased balance;Decreased knowledge of use of DME       PT Treatment Interventions DME instruction;Therapeutic activities;Therapeutic exercise;Gait training;Patient/family education;Balance training;Functional mobility training    PT Goals (Current goals can be found in the Care Plan section)  Acute Rehab PT Goals Patient Stated Goal: breathe better PT Goal Formulation: With patient Time For Goal Achievement: 04/06/20 Potential to Achieve Goals: Fair    Frequency Min 3X/week   Barriers to discharge        Co-evaluation               AM-PAC PT "6 Clicks" Mobility  Outcome Measure Help needed turning from your back to your side while in a flat bed without using bedrails?: A Little Help needed moving from lying on your back to sitting on the side of a flat bed without using bedrails?: A Little Help needed moving to and from a bed to a chair (including a wheelchair)?: A Little Help needed standing up from a chair using your arms (e.g., wheelchair or bedside chair)?: A Little Help needed to walk in hospital room?: A Little Help needed climbing 3-5 steps with a railing? : A Lot 6 Click Score: 17    End of Session Equipment Utilized During Treatment: Oxygen Activity Tolerance: Patient limited by fatigue Patient left: in bed;with call bell/phone within reach;with bed alarm set   PT Visit Diagnosis: Muscle weakness (generalized) (M62.81);Other abnormalities of gait and  mobility (R26.89)    Time: 3833-3832 PT Time Calculation (min) (ACUTE ONLY): 30 min   Charges:   PT Evaluation $PT Eval Moderate Complexity: 1 Mod PT Treatments $Gait Training: 8-22 mins        Magda Kiel, PT Acute Rehabilitation Services NVBTY:[phone] Office:[phone] 03/23/2020   Wendy [name] 03/23/2020, 5:27 PM

## 2020-03-23 NOTE — Progress Notes (Signed)
RN paged NP earlier regarding patient's increased WOB from start of shift.  ABG showed PO2 of 38.7.  RT started pt on Venti mask.  Will recheck ABG in am.

## 2020-03-23 NOTE — ED Notes (Signed)
Pt received breakfast tray 

## 2020-03-23 NOTE — Progress Notes (Signed)
PROGRESS NOTE  Wendy [name] WER:[id number] DOB: [birth year] DOA: 03/22/2020 PCP: Marin Olp, MD  Brief History    Wendy [name] is a 79 y.o. female with medical history significant of history significant ofmetastatic breast cancer to lung bone lymph nodes and liver in April 2021, s/p chemo, recurrent right-sided malignant pleural effusion status post thoracentesis x2 on May 25 and March 18 2020, anemia, anxiety, depression, metastatic breast cancer currently on chemo, CKD, hypertension, hyperlipidemia, GERD, insulin-dependent diabetes presenting with complaints of shortness of breath.  During last maintenance admission, on 5/25, 320 cc fluid was drained. Cytology showing malignant cells consistent with metastatic adenocarcinoma.  Patient was discharged home about 2 weeks ago and was doing fine for about a week then gradually started from 1 week ago she started to feel short of breath again with little cough and wheezing.  No fever or chills, she had to to turn up her oxygen from baseline 2 L to 4 L for she became easy desaturation when ambulate.  She went to see oncologist on June 14, and on to 17th, patient underwent a right-sided thoracentesis with removal about 400 mL pleural effusion and patient was also diagnosed with bilateral pneumonia and Levaquin was started aiming at total of 7 days which patient is still taking.  Patient breathing condition however did not significant proved continue with denied any cough, no fever or chills no dysuria no diarrhea no chest pains. ED Course: CT angiogram negative for PE but recurrent right-sided moderate pleural effusion compared to 4 days ago.  Blood work WBC 8.0, creatinine 1.4, glucose 311.  [facility] were consulted to admit the patient for further evaluation and treatment. PCCM was consulted. They feel that the patient will benefit from Pleurex catheter placement. This has been scheduled for 03/24/2020.Pericardial effusion was also noted on the  CTA chest from admission. Echocardiogram has been ordered that is still pending. Palliative care has also been consulted.  Consultants  . PCCM . Palliative care  Procedures  . None  Antibiotics   Anti-infectives (From admission, onward)   Start     Dose/Rate Route Frequency Ordered Stop   03/22/20 2200  amoxicillin-clavulanate (AUGMENTIN) 875-125 MG per tablet 1 tablet     Discontinue     1 tablet Oral Every 12 hours 03/22/20 1611     03/22/20 1300  cefTRIAXone (ROCEPHIN) 1 g in sodium chloride 0.9 % 100 mL IVPB        1 g 200 mL/hr over 30 Minutes Intravenous  Once 03/22/20 1246 03/22/20 1400   03/22/20 1300  azithromycin (ZITHROMAX) 500 mg in sodium chloride 0.9 % 250 mL IVPB        500 mg 250 mL/hr over 60 Minutes Intravenous  Once 03/22/20 1246 03/22/20 1436    .  Subjective  The patient is resting in bed. She is tachypneic and using accessory muscles to breath. She appears tired and states that she is fatigued.  Objective   Vitals:  Vitals:   03/23/20 1007 03/23/20 1223  BP: (!) 163/101   Pulse: 96   Resp: 20   Temp: 97.8 F (36.6 C)   SpO2: 94% 90%   Exam:  Constitutional:  . The patient is awake, alert, and oriented x 3. She is fatigued and in moderate distress from the increased work of breathing. Respiratory:  . No increased work of breathing. . No wheezes, rales, or rhonchi . No tactile fremitus . Dullness at bases Cardiovascular:  . Regular rate and rhythm .  No murmurs, ectopy, or gallups. . No lateral PMI. No thrills. Abdomen:  . Abdomen is soft, non-tender, non-distended . No hernias, masses, or organomegaly . Normoactive bowel sounds.  Musculoskeletal:  . No cyanosis, clubbing, or edema Skin:  . No rashes, lesions, ulcers . palpation of skin: no induration or nodules Neurologic:  . CN 2-12 intact . Sensation all 4 extremities intact Psychiatric:  . Mental status o Mood, affect appropriate o Orientation to person, place, time  . judgment  and insight appear intact  I have personally reviewed the following:   Today's Data  . Vitals, BMP, CBC  Micro Data  . Blood Cultures x 2: No growth  Imaging  . CTA chest: No PE. Moderate Rt>Lt pleural effusions. Multiple bilateral pulmonary nodules consistent with metastatic breast cancer. Trace percardial effusion. Soft tissue mass in the lateral left breast and axilla in keeping with known breast malignancy.  Cardiology Data  . Echo pending.  Scheduled Meds: . amLODipine  10 mg Oral Daily  . amoxicillin-clavulanate  1 tablet Oral Q12H  . busPIRone  10 mg Oral TID  . capecitabine  1,500 mg Oral BID PC  . carvedilol  25 mg Oral BID WC  . cloNIDine  0.3 mg Oral BID  . enoxaparin (LOVENOX) injection  40 mg Subcutaneous Q24H  . famotidine  20 mg Oral Daily  . fluticasone  1 spray Each [name] Daily  . fluticasone furoate-vilanterol  1 puff Inhalation Daily  . lisinopril  20 mg Oral Daily   And  . hydrochlorothiazide  12.5 mg Oral Daily  . insulin aspart  0-15 Units Subcutaneous TID WC  . insulin aspart  0-5 Units Subcutaneous QHS  . insulin glargine  28 Units Subcutaneous Daily  . mirtazapine  7.5 mg Oral QHS  . montelukast  10 mg Oral QHS  . 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 The patient is tachypneic with accessory muscle use at rest despite supplemental O2 of 5L in order to maintain saturations of 90%.  COPD in exacerbation: Continue nebulizers.   Pericardial Effusion: Almost certainly malignant. Echocardiogram is pending.  Low voltage EKG: Likely due to pericardial effusion. Echocardiogram is pending.   Metastatic breast cancer: Noted. The patient is taking oral Xeloda. Per oncology, her cancer is no longer curable, but is treatable. Palliative care has been consulted.  IDDM with hyperglycemia: Lantus 28 units daily. The patient's glucoses are also being followed by FSBS and SSI QAC and HS.   HTN: Blood pressures are elevated. Despite continuation of home meds. Will add hydralazine.  Moderate pulmonary hypertension: PE ruled out, she may benefit from Lasix we will monitor her blood pressure and then decide. Echocardiogram is pending.  CKD stage II: Monitor creatinine, electrolytes, and volume status. Avoid nephrotoxic meds and hypotension.   I have seen  and examined this patient myself. I have spent 35 minutes in her evaluation and care.  DVT prophylaxis: Lovenox  code Status: DNR Family Communication: None at bedside Disposition Plan: Patient has complicated medical issue with meta static breast cancer to the right side along with malignant pleural effusion recurrent, will need PleurX procedure likely will need more than 2 midnight hospital stay to improve oxyenation. Barriers to discharge include increased work of breathing.  Status is: Inpatient  Remains inpatient appropriate because:Inpatient level of care appropriate due to severity of illness   Dispo: The patient is from: Home              Anticipated d/c is to: Home              Anticipated d/c date is: 2 days              Patient currently is not medically stable to d/c.            Rami Waddle, DO [facility] Direct contact: see [email or web address]    7PM-7AM contact night coverage as above 03/23/2020, 2:02 PM  LOS: 1 day

## 2020-03-23 NOTE — Progress Notes (Signed)
This note also relates to the following rows which could not be included: ECG Heart Rate - Cannot attach notes to unvalidated device data    03/23/20 1504 03/23/20 1606  Assess: MEWS Score  Temp  --  97.7 F (36.5 C)  BP  --  140/71  Pulse Rate 100 76  Resp (!) 30 18  SpO2 95 % 96 %  O2 Device Nasal Cannula Nasal Cannula  O2 Flow Rate (L/min) 5 L/min 5 L/min  Assess: MEWS Score  MEWS Temp 0 0  MEWS Systolic 0 0  MEWS Pulse 0 0  MEWS RR 2 0  MEWS LOC 0 0  MEWS Score 2 0  MEWS Score Color Yellow Green  Assess: if the MEWS score is Yellow or Red  Were vital signs taken at a resting state? No  --   Focused Assessment Documented focused assessment  --   Early Detection of Sepsis Score *See Row Information* High  --   MEWS guidelines implemented *See Row Information* No, other (Comment) (patient working with physical therapist)  --   Treat  MEWS Interventions Other (Comment) (Patient just received breathing treatment, now resting)  --   Document  Patient Outcome Stabilized after interventions  --    Patient participated in physical therapy. RR improved after rest. Patient denies pain or SOB at this time.

## 2020-03-23 NOTE — Progress Notes (Signed)
NAME:  Wendy [name], MRN:  [id number], DOB:  January 06, 1941, LOS: 1 ADMISSION DATE:  03/22/2020, CONSULTATION DATE:  03/23/2020 REFERRING MD:  Dr Roosevelt Locks, CHIEF COMPLAINT:  Shortness of breath  Brief History    79 year old lady with metastatic breast cancer, ongoing chemotherapy Recently had thoracentesis performed for 400 cc from right pleural space She had had thoracentesis a few weeks earlier History of present illness   Worsening shortness of breath over the last couple of days Was recently treated for pneumonia-used Levaquin Activated EMS secondary to worsening shortness of breath She is coughing, wheezing, just feels exhausted  Past Medical History   Past Medical History:  Diagnosis Date  . Abnormal breast finding 2018   per pt/ having  a lot drainage from left breast nipple  . Anemia   . Anxiety   . Breast cancer ([place]) 11/2016   left/  . CKD (chronic kidney disease)   . Depression   . GERD (gastroesophageal reflux disease)    TUMS as needed  . HTN (hypertension)    states BP has been high recently; has been on med. x 20 yr.  . Hyperlipidemia   . Hyperplastic colon polyp   . HYPERTENSION, BENIGN SYSTEMIC 11/29/2006   Lisinopril hctz 10-12.5mg , amlodipine 10mg , coreg 25mg  BID, clonidine 0.3 mg BID.   Marland Kitchen Insulin dependent diabetes mellitus   . Personal history of chemotherapy    2018/finished 6 weeks of chemo in Sep 2018  . Personal history of radiation therapy    2018 left breast/finished radiation in Sept 2018 per pt.   Significant Hospital Events   Breathing feels about the same Consults:  pccm  Procedures:  None  Significant Diagnostic Tests:  Echocardiogram 02/05/2020 Ejection fraction of 60 to 65%, does show diastolic dysfunction, mild pulmonary hypertension   CT scan of the chest 03/22/2020 IMPRESSION: 1. Negative examination for pulmonary embolism. 2. Moderate right, small left pleural effusions, increased compared to prior CT dated 03/02/2020. 3. Numerous  bilateral pulmonary nodules, in keeping with metastatic breast malignancy. 4. Trace pericardial effusion, unchanged compared to prior examination. 5. Soft tissue mass in the lateral left breast and axilla, in keeping with known breast malignancy.  Micro Data:  6/21 blood cultures x2 6/21 urine culture negative to date Antimicrobials:  Augmentin  Interim history/subjective:  Patient with metastatic breast cancer, multiple thoracentesis came in with shortness of breath Feels about the same  Objective   Blood pressure (!) 163/101, pulse 96, temperature 97.8 F (36.6 C), temperature source Oral, resp. rate 20, SpO2 94 %.       No intake or output data in the 24 hours ending 03/23/20 1129 There were no vitals filed for this visit.  Examination: General: Elderly lady, tachypneic HENT: Moist oral mucosa Lungs: Decreased air entry bilaterally Cardiovascular: S1-S2 appreciated Abdomen: Bowel sounds appreciated Extremities: No clubbing, no edema Neuro: Alert and oriented x3 GU:   CT chest reviewed by myself Resolved Hospital Problem list     Assessment & Plan:  Metastatic breast cancer with recurrent pleural effusions -Metastasis to the lungs, lymph nodes, liver -Advanced disease -Incurable but treatable per oncology opinion, on Xeloda  Malignant pleural effusion Thoracentesis on May 25-320 cc of fluid, June 1-300cc, June 17-400 cc of fluid -May benefit from Pleurx catheter placement -Hypothermia-resolved  Hypoxemic respiratory failure Obstructive lung disease -Continue oxygen supplementation -Continue bronchodilators  Pneumonia -Continue Augmentin -Use Levaquin at home from the 17th  Cultures negative to date  Pericardial effusion -Follow-up echo results  Diabetes  Chronic kidney disease  Prognosis guarded  Pleurx catheter placement scheduled for 03/24/2020 at 2:00  Sherrilyn Rist, MD [facility] PCCM Pager: [phone]

## 2020-03-23 NOTE — Plan of Care (Signed)
  Problem: Education: Goal: Knowledge of General Education information will improve Description: Including pain rating scale, medication(s)/side effects and non-pharmacologic comfort measures Outcome: Progressing   Problem: Health Behavior/Discharge Planning: Goal: Ability to manage health-related needs will improve Outcome: Progressing   Problem: Clinical Measurements: Goal: Respiratory complications will improve Outcome: Progressing   Problem: Activity: Goal: Risk for activity intolerance will decrease Outcome: Progressing  Patient able to transfer to [facility] without dyspnea. Patient experiencing elevated RR while working with physical therapy, now resting in bed after breathing treatment.

## 2020-03-23 NOTE — Consult Note (Signed)
   [facility] Inpatient Consult   03/23/2020  MAGALENE MCLEAR 1941/09/03 [id number]   [facility] [ACO] Patient:  Medicare NextGen  Patient screened for high risk score for unplanned readmission score and for less than 30 days readmission hospitalizations.  Medical record reviewed to check if potential [facility] service needs.  Review of patient's medical record reveals patient is active with Theatre manager in encounters noted. Primary Care Provider is  Garret Reddish, MD this provider is listed to provide the transition of care [TOC] for post hospital follow up. Plan:  Will follow for progress and follow disposition.  If returning home with [facility], patient needs will be met at that level. Continue to follow progress notes and inpatient [facility] team notes, and disposition to assess for post hospital care management needs.    For questions contact:   Natividad Brood, RN BSN [facility] Liaison  [phone] business mobile phone Toll free office [phone]  Fax number: [phone] [email or web address]

## 2020-03-24 ENCOUNTER — Encounter (HOSPITAL_COMMUNITY)
Admission: EM | Disposition: A | Discharge status: Home Health | Payer: Self-pay | Source: Home / Self Care | Attending: Internal Medicine

## 2020-03-24 ENCOUNTER — Encounter (HOSPITAL_COMMUNITY): Payer: Self-pay | Admitting: Internal Medicine

## 2020-03-24 DIAGNOSIS — Z170 Estrogen receptor positive status [ER+]: Secondary | ICD-10-CM

## 2020-03-24 DIAGNOSIS — I10 Essential (primary) hypertension: Secondary | ICD-10-CM

## 2020-03-24 DIAGNOSIS — Z66 Do not resuscitate: Secondary | ICD-10-CM

## 2020-03-24 DIAGNOSIS — N1831 Chronic kidney disease, stage 3a: Secondary | ICD-10-CM

## 2020-03-24 DIAGNOSIS — C50412 Malignant neoplasm of upper-outer quadrant of left female breast: Secondary | ICD-10-CM

## 2020-03-24 LAB — BASIC METABOLIC PANEL
Anion gap: 10 (ref 5–15)
BUN: 34 mg/dL — ABNORMAL HIGH (ref 8–23)
CO2: 28 mmol/L (ref 22–32)
Calcium: 8.5 mg/dL — ABNORMAL LOW (ref 8.9–10.3)
Chloride: 98 mmol/L (ref 98–111)
Creatinine, Ser: 1.96 mg/dL — ABNORMAL HIGH (ref 0.44–1.00)
GFR calc Af Amer: 28 mL/min — ABNORMAL LOW (ref >60)
GFR calc non Af Amer: 24 mL/min — ABNORMAL LOW (ref >60)
Glucose, Bld: 185 mg/dL — ABNORMAL HIGH (ref 70–99)
Potassium: 4.4 mmol/L (ref 3.5–5.1)
Sodium: 136 mmol/L (ref 135–145)

## 2020-03-24 LAB — GLUCOSE, CAPILLARY
Glucose-Capillary: 163 mg/dL — ABNORMAL HIGH (ref 70–99)
Glucose-Capillary: 117 mg/dL — ABNORMAL HIGH (ref 70–99)
Glucose-Capillary: 119 mg/dL — ABNORMAL HIGH (ref 70–99)
Glucose-Capillary: 288 mg/dL — ABNORMAL HIGH (ref 70–99)

## 2020-03-24 LAB — BLOOD GAS, ARTERIAL
Acid-Base Excess: 4.4 mmol/L — ABNORMAL HIGH (ref 0.0–2.0)
Bicarbonate: 29.4 mmol/L — ABNORMAL HIGH (ref 20.0–28.0)
FIO2: 55
O2 Saturation: 98 %
Patient temperature: 36.9
pCO2 arterial: 51.5 mmHg — ABNORMAL HIGH (ref 32.0–48.0)
pH, Arterial: 7.374 (ref 7.350–7.450)
pO2, Arterial: 113 mmHg — ABNORMAL HIGH (ref 83.0–108.0)

## 2020-03-24 SURGERY — CANCELLED PROCEDURE

## 2020-03-24 MED ORDER — MIDAZOLAM HCL (PF) 5 MG/ML IJ SOLN
INTRAMUSCULAR | Status: AC
  Filled 2020-03-24: qty 1

## 2020-03-24 MED ORDER — ENOXAPARIN SODIUM 30 MG/0.3ML ~~LOC~~ SOLN
30.0000 mg | SUBCUTANEOUS | Status: DC
  Administered 2020-03-24 – 2020-03-25 (×2): 30 mg via SUBCUTANEOUS
  Filled 2020-03-24 (×2): qty 0.3

## 2020-03-24 MED ORDER — CEFAZOLIN SODIUM-DEXTROSE 2-4 GM/100ML-% IV SOLN: 2.0000 g | INTRAVENOUS | Status: DC

## 2020-03-24 MED ORDER — FENTANYL CITRATE (PF) 100 MCG/2ML IJ SOLN
INTRAMUSCULAR | Status: AC
  Filled 2020-03-24: qty 2

## 2020-03-24 NOTE — Consult Note (Addendum)
Palliative Care Consultation Note Reason: Goals of Care  79 yo woman with metastatic breast cancer, multiple rounds of chemotherapy, recently started on Xeloda. She has metastatic disease involving her lungs and CT reviewed with radiology during Palliative-Oncology Conference. Main issues are with rapidly recurring malignant pleural effusions and concern for infectious process vs. Cancer progression involving the lungs. Her last thoracentesis was 6/17.  Met with patient who has been seen both by our service and by community based Palliative Care. Fortunately she does not have significant pain issues related to skeletal mets, but her most difficult symptom is severe shortness of breath. She was admitted for possible pleurx catheter placement-empiric treatment of PNA. She is moderately dyspneic at rest but was able to get up to [facility].  Wendy [name] is motivated towards treatment- she hopes the Xeloda will help her and keep her cancer under control. There is a poor prognosis associate with recurrent pleural effusions. Will ask oncology to weigh in on continued tx and expected outcomes vs. transition to a hospice care approach. She will need a Pleurx for her comfort and this will need to be managed at home. She has good family support including a DIL who is an Therapist, sports and has been very helpful in her care plan.   Recommendations:  1. Management of pleural effusions, needs thoracentesis ASAP and then Pleurx placement.   2. She has hydrocodone for pain and will add on a very small dose of IV morphine for dyspnea or distress.  3. Patient has DNR, otherwise full scope treatment.  4. Encourage mobility as tolerated- she wants to go home following discharge.  Will continue to follow and support during this hospitalization. She is moderate to high risk for decompensation from a respiratory standpoint related to what appears to be rapid re accumulation of pleural fluid and tumor progression.  Lane Hacker,  DO Palliative Medicine [phone]  Time: 30 minutes Greater than 50%  of this time was spent counseling and coordinating care related to the above assessment and plan.

## 2020-03-24 NOTE — Interval H&P Note (Signed)
History and Physical Interval Note:  03/24/2020 1:58 PM  Wendy [name]  has presented today for surgery, with the diagnosis of pleural effusion.  The various methods of treatment have been discussed with the patient and family. After consideration of risks, benefits and other options for treatment, the patient has consented to  Procedure(s): INSERTION PLEURAL DRAINAGE CATHETER (N/A) as a surgical intervention.  The patient's history has been reviewed, patient examined, no change in status, stable for surgery.  I have reviewed the patient's chart and labs.  Questions were answered to the patient's satisfaction.    Questions answered, agrees to proceed with catheter placement  Everest Brod A Rino Hosea

## 2020-03-24 NOTE — Progress Notes (Signed)
Pleural catheter procedure canceled per verbal order from Dr. Ander Slade.  Dr. Ander Slade educated patient about plans to wait for catheter until next week.  Pt verbalized understanding.

## 2020-03-24 NOTE — Progress Notes (Signed)
PROGRESS NOTE  Wendy [name] ZTI:[id number] DOB: [birth year] DOA: 03/22/2020 PCP: Marin Olp, MD  HPI/Recap of past 25 hours: 79 year old female with past medical history of metastatic breast cancer currently on chemotherapy, stage III chronic kidney disease, morbid obesity and diabetes plus hypertension admitted on 6/21 with worsening shortness of breath.  Patient had been admitted 3 weeks prior to the hospitalist service for asthma exacerbation and bilateral pleural effusions and treated with thoracentesis.  Recurrent pleural effusions felt to be secondary to recurring breast cancer.  Right-sided pleural effusion noted on chest x-ray.  Discussed with pulmonary with plans for Pleurx catheter.  Patient seen by palliative care with DNR confirmed, but otherwise wanted full scope of treatment.  It is understood as has been communicated by oncologist that her cancer is not curable, but she is receiving chemotherapy treatment.  Patient was taken down this afternoon for Pleurx catheter placement, however ultrasound of the chest was done prior to initiating procedure an adequate pocket of fluid introduced the Pleurx catheter into was not found.  The procedure was terminated and this was discussed with the patient that the best plan was to likely wait until fluid was to reaccumulate.  Patient seen this morning prior to procedure at that time with no complaints and resting comfortably.  Assessment/Plan: Principal Problem: Acute respiratory failure with hypoxia secondary to recurrent malignant pleural effusion from metastatic breast cancer: Being followed by palliative care as well as oncology.  Breast cancer felt to be treatable with chemo but not curable.  Patient wishes to stay DNR and otherwise want full measures.  As above, Pleurx catheter not able to be placed at this time.  Will monitor overnight and likely discharge home tomorrow. 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    Morbid obesity ([place]): Patient is criteria for BMI greater than 35+ comorbidities of diabetes and hypertension.    Essential hypertension: Blood pressure stable, continue home medications.    Diastolic dysfunction: Appears to be stable    CKD (chronic kidney disease) stage 3, GFR 30-59 ml/min (HCC): Appears to be at baseline   Malignant neoplasm of upper-outer quadrant of left breast in female, estrogen receptor positive ([place])   Code Status: DNR  Family Communication: Updated daughter-in-law by phone who is a nurse here  Disposition Plan: Potential discharge tomorrow   Consultants:  Pulmonary  Palliative care  Procedures:  Attempted Pleurx catheter placement on 6/23-unable to place  Echocardiogram 6/22: Preserved ejection fraction.  Unable to assess diastolic dysfunction.  Trivial pericardial effusion noted  Antimicrobials:  None  DVT prophylaxis: Lovenox   Objective: Vitals:   03/24/20 1405 03/24/20 1426  BP: (!) 145/60   Pulse: 70   Resp: 12   Temp:    SpO2: 100% 99%   No intake or output data in the 24 hours ending 03/24/20 1515 There were no vitals filed for this visit. There is no height or weight on file to calculate BMI.  Exam:   General: Alert and oriented x3, no acute distress  Cardiovascular: Regular rate and rhythm, S1-S2  Respiratory: Decreased breath sounds bibasilar  Abdomen: Soft, nontender, nondistended, hypoactive bowel sounds  Musculoskeletal: No clubbing or cyanosis, trace pitting edema  Skin: No skin breaks, tears or lesions  Psychiatry: Appropriate, no evidence of psychoses  Neuro: No focal deficits   Data Reviewed: CBC: Recent Labs  Lab 03/22/20  1200 03/23/20 0325  WBC 8.0 7.4  NEUTROABS 7.5  --   HGB 9.9* 10.8*  HCT 30.0* 31.9*  MCV 89.0 87.6  PLT 279 568    Basic Metabolic Panel: Recent Labs  Lab 03/22/20 1200 03/23/20 0325 03/24/20 0226  NA 136 136 136  K 4.9 4.6 4.4  CL 99 99 98  CO2 28 25 28   GLUCOSE 311* 213* 185*  BUN 19 23 34*  CREATININE 1.41* 1.54* 1.96*  CALCIUM 8.9 9.2 8.5*   GFR: Estimated Creatinine Clearance: 24.8 mL/min (A) (by C-G formula based on SCr of 1.96 mg/dL (H)). Liver Function Tests: No results for input(s): AST, ALT, ALKPHOS, BILITOT, PROT, ALBUMIN in the last 168 hours. No results for input(s): LIPASE, AMYLASE in the last 168 hours. No results for input(s): AMMONIA in the last 168 hours. Coagulation Profile: No results for input(s): INR, PROTIME in the last 168 hours. Cardiac Enzymes: No results for input(s): CKTOTAL, CKMB, CKMBINDEX, TROPONINI in the last 168 hours. BNP (last 3 results) Recent Labs    09/15/19 1552  PROBNP CANCELED   HbA1C: No results for input(s): HGBA1C in the last 72 hours. CBG: Recent Labs  Lab 03/23/20 1150 03/23/20 1608 03/23/20 2104 03/24/20 0746 03/24/20 1133  GLUCAP 308* 289* 233* 163* 117*   Lipid Profile: No results for input(s): CHOL, HDL, LDLCALC, TRIG, CHOLHDL, LDLDIRECT in the last 72 hours. Thyroid Function Tests: No results for input(s): TSH, T4TOTAL, FREET4, T3FREE, THYROIDAB in the last 72 hours. Anemia Panel: No results for input(s): VITAMINB12, FOLATE, FERRITIN, TIBC, IRON, RETICCTPCT in the last 72 hours. Urine analysis:    Component Value Date/Time   COLORURINE YELLOW 03/22/2020 1226   APPEARANCEUR CLEAR 03/22/2020 1226   LABSPEC 1.010 03/22/2020 1226   PHURINE 5.0 03/22/2020 1226   GLUCOSEU >=500 (A) 03/22/2020 1226   HGBUR MODERATE (A) 03/22/2020 1226   BILIRUBINUR NEGATIVE 03/22/2020 1226   BILIRUBINUR Negative 10/07/2019 1121   KETONESUR 5 (A) 03/22/2020 1226   PROTEINUR >=300 (A) 03/22/2020 1226   UROBILINOGEN 0.2 10/07/2019 1121   NITRITE NEGATIVE 03/22/2020 1226   LEUKOCYTESUR NEGATIVE 03/22/2020 1226   Sepsis  Labs: @LABRCNTIP (procalcitonin:4,lacticidven:4)  ) Recent Results (from the past 240 hour(s))  SARS Coronavirus 2 (TAT 6-24 hrs)     Status: None   Collection Time: 03/17/20 12:30 PM   Specimen: Nasopharyngeal Swab  Result Value Ref Range Status   SARS Coronavirus 2 NEGATIVE NEGATIVE Final    Comment: (NOTE) SARS-CoV-2 target nucleic acids are NOT DETECTED.  The SARS-CoV-2 RNA is generally detectable in upper and lower respiratory specimens during the acute phase of infection. Negative results do not preclude SARS-CoV-2 infection, do not rule out co-infections with other pathogens, and should not be used as the sole basis for treatment or other patient management decisions. Negative results must be combined with clinical observations, patient history, and epidemiological information. The expected result is Negative.  Fact Sheet for Patients: SugarRoll.be  Fact Sheet for Healthcare Providers: https://www.woods-mathews.com/  This test is not yet approved or cleared by the [place] FDA and  has been authorized for detection and/or diagnosis of SARS-CoV-2 by FDA under an Emergency Use Authorization (EUA). This EUA will remain  in effect (meaning this test can be used) for the duration of the COVID-19 declaration under Se ction 564(b)(1) of the Act, 21 U.S.C. section 360bbb-3(b)(1), unless the authorization is terminated or revoked sooner.  Performed at [facility], [place] [address], [place], [place] [address]   Urine culture  Status: None   Collection Time: 03/22/20 12:34 PM   Specimen: Urine, Clean Catch  Result Value Ref Range Status   Specimen Description URINE, CLEAN CATCH  Final   Special Requests NONE  Final   Culture   Final    NO GROWTH Performed at [facility], [address], [place], [place] [address]    Report Status 03/23/2020 FINAL  Final  SARS Coronavirus 2 by RT PCR (hospital order, performed in  [facility] hospital lab) Nasopharyngeal Nasopharyngeal Swab     Status: None   Collection Time: 03/22/20  2:15 PM   Specimen: Nasopharyngeal Swab  Result Value Ref Range Status   SARS Coronavirus 2 NEGATIVE NEGATIVE Final    Comment: (NOTE) SARS-CoV-2 target nucleic acids are NOT DETECTED.  The SARS-CoV-2 RNA is generally detectable in upper and lower respiratory specimens during the acute phase of infection. The lowest concentration of SARS-CoV-2 viral copies this assay can detect is 250 copies / mL. A negative result does not preclude SARS-CoV-2 infection and should not be used as the sole basis for treatment or other patient management decisions.  A negative result may occur with improper specimen collection / handling, submission of specimen other than nasopharyngeal swab, presence of viral mutation(s) within the areas targeted by this assay, and inadequate number of viral copies (<250 copies / mL). A negative result must be combined with clinical observations, patient history, and epidemiological information.  Fact Sheet for Patients:   StrictlyIdeas.no  Fact Sheet for Healthcare Providers: BankingDealers.co.za  This test is not yet approved or  cleared by the [place] FDA and has been authorized for detection and/or diagnosis of SARS-CoV-2 by FDA under an Emergency Use Authorization (EUA).  This EUA will remain in effect (meaning this test can be used) for the duration of the COVID-19 declaration under Section 564(b)(1) of the Act, 21 U.S.C. section 360bbb-3(b)(1), unless the authorization is terminated or revoked sooner.  Performed at [facility], [place] [address], [place], [place] [address]   Culture, blood (Routine X 2) w Reflex to ID Panel     Status: None (Preliminary result)   Collection Time: 03/22/20  4:53 PM   Specimen: BLOOD RIGHT ARM  Result Value Ref Range Status   Specimen Description BLOOD RIGHT ARM   Final   Special Requests   Final    BOTTLES DRAWN AEROBIC ONLY Blood Culture adequate volume   Culture   Final    NO GROWTH 2 DAYS Performed at [facility], [place] [address] [place], [place] [address]    Report Status PENDING  Incomplete  Culture, blood (Routine X 2) w Reflex to ID Panel     Status: None (Preliminary result)   Collection Time: 03/22/20  4:58 PM   Specimen: BLOOD  Result Value Ref Range Status   Specimen Description BLOOD RIGHT ANTECUBITAL  Final   Special Requests   Final    BOTTLES DRAWN AEROBIC ONLY Blood Culture adequate volume   Culture   Final    NO GROWTH 2 DAYS Performed at [facility], [place] [address], [place], [place] [address]    Report Status PENDING  Incomplete      Studies: ECHOCARDIOGRAM LIMITED  Result Date: 03/23/2020    ECHOCARDIOGRAM REPORT   Patient Name:   Wendy [name] Date of Exam: 03/23/2020 Medical Rec #:  [id number]    Height:       62.0 in Accession #:    [id number]  Weight:       200.4 lb Date of Birth:  October 22, 1940    BSA:          1.913 m Patient Age:    63 years     BP:           163/101 mmHg Patient Gender: F            HR:           100 bpm. Exam Location:  Inpatient Procedure: Limited Echo, Cardiac Doppler and Color Doppler Indications:     Pericardial effusion 423.9 / I31.3  History:         Patient has prior history of Echocardiogram examinations, most                  recent 02/05/2020. Risk Factors:Diabetes, Hypertension and                  Dyslipidemia. Multiple metastatic breast cancer, ,metastatic                  pleural effusion, GERD, Chronic kidney disease.  Sonographer:     Darlina Sicilian RDCS Referring Phys:  [id number] Lequita Halt Diagnosing Phys: Fransico Him MD IMPRESSIONS  1. Left ventricular ejection fraction, by estimation, is 55 to 60%. The left ventricle has normal function. Left ventricular endocardial border not optimally defined to evaluate regional wall motion. Left ventricular diastolic function could not be  evaluated.  2. Right ventricular systolic function was not well visualized. The right ventricular size is not well visualized. There is moderately elevated pulmonary artery systolic pressure. The estimated right ventricular systolic pressure is 79.0 mmHg.  3. The mitral valve is normal in structure. No evidence of mitral valve regurgitation. No evidence of mitral stenosis.  4. The aortic valve is normal in structure. Aortic valve regurgitation is not visualized. No aortic stenosis is present.  5. The inferior vena cava is dilated in size with >50% respiratory variability, suggesting right atrial pressure of 8 mmHg.  6. A small pericardial effusion is present. The pericardial effusion is circumferential. There is no evidence of cardiac tamponade. FINDINGS  Left Ventricle: Left ventricular ejection fraction, by estimation, is 55 to 60%. The left ventricle has normal function. Left ventricular endocardial border not optimally defined to evaluate regional wall motion. The left ventricular internal cavity size was normal in size. There is no left ventricular hypertrophy. Left ventricular diastolic function could not be evaluated. Right Ventricle: The right ventricular size is not well visualized. Right vetricular wall thickness was not assessed. Right ventricular systolic function was not well visualized. There is moderately elevated pulmonary artery systolic pressure. The tricuspid regurgitant velocity is 2.85 m/s, and with an assumed right atrial pressure of 8 mmHg, the estimated right ventricular systolic pressure is 24.0 mmHg. Left Atrium: Left atrial size was normal in size. Right Atrium: Right atrial size was normal in size. Pericardium: A small pericardial effusion is present. The pericardial effusion is circumferential. There is no evidence of cardiac tamponade. Mitral Valve: The mitral valve is normal in structure. There is mild calcification of the anterior mitral valve leaflet(s). Normal mobility of the mitral  valve leaflets. Mild mitral annular calcification. No evidence of mitral valve regurgitation. No evidence of mitral valve stenosis. Tricuspid Valve: The tricuspid valve is normal in structure. Tricuspid valve regurgitation is mild . No evidence of tricuspid stenosis. Aortic Valve: The aortic valve is normal in structure. Aortic valve regurgitation is not visualized. No aortic stenosis is  present. Pulmonic Valve: The pulmonic valve was normal in structure. Pulmonic valve regurgitation is not visualized. No evidence of pulmonic stenosis. Aorta: The aortic root is normal in size and structure. Venous: The inferior vena cava is dilated in size with greater than 50% respiratory variability, suggesting right atrial pressure of 8 mmHg. IAS/Shunts: No atrial level shunt detected by color flow Doppler.  LEFT VENTRICLE PLAX 2D LVIDd:         4.00 cm LVIDs:         2.90 cm LV PW:         1.00 cm LV IVS:        1.10 cm LVOT diam:     2.00 cm LVOT Area:     3.14 cm  LV Volumes (MOD) LV vol d, MOD A2C: 41.1 ml LV vol d, MOD A4C: 66.2 ml LV vol s, MOD A2C: 24.2 ml LV vol s, MOD A4C: 27.8 ml LV SV MOD A2C:     16.9 ml LV SV MOD A4C:     66.2 ml LV SV MOD BP:      28.0 ml LEFT ATRIUM         Index LA diam:    3.70 cm 1.93 cm/m   AORTA Ao Root diam: 3.20 cm TRICUSPID VALVE TR Peak grad:   32.5 mmHg TR Vmax:        285.00 cm/s  SHUNTS Systemic Diam: 2.00 cm Fransico Him MD Electronically signed by Fransico Him MD Signature Date/Time: 03/23/2020/3:43:48 PM    Final (Updated)     Scheduled Meds:  amLODipine  10 mg Oral Daily   amoxicillin-clavulanate  1 tablet Oral Q12H   busPIRone  10 mg Oral TID   capecitabine  1,500 mg Oral BID PC   carvedilol  25 mg Oral BID WC   cloNIDine  0.3 mg Oral BID   enoxaparin (LOVENOX) injection  30 mg Subcutaneous Q24H   famotidine  20 mg Oral Daily   fluticasone  1 spray Each [name] Daily   fluticasone furoate-vilanterol  1 puff Inhalation Daily   hydrochlorothiazide  12.5 mg Oral  Daily   insulin aspart  0-15 Units Subcutaneous TID WC   insulin aspart  0-5 Units Subcutaneous QHS   insulin glargine  28 Units Subcutaneous Daily   ipratropium-albuterol  3 mL Nebulization TID   mirtazapine  7.5 mg Oral QHS   montelukast  10 mg Oral QHS   pravastatin  40 mg Oral q1800    Continuous Infusions:   LOS: 2 days     Annita Brod, MD [facility]   03/24/2020, 3:15 PM

## 2020-03-24 NOTE — Progress Notes (Signed)
Pt resting at this time, decreased work effort for ventilation. Pt continued on venti mask at 15l/55%.No distress noted.

## 2020-03-24 NOTE — Progress Notes (Signed)
Pleurx catheter placement could not be performed as an adequate pocket of fluid was not identified during procedure.  It will be best to wait for reaccumulation of fluid prior to attempting to place a Pleurx catheter.  May take a week or more

## 2020-03-24 NOTE — Op Note (Signed)
Tunneled pleural catheter placement with imaging guidance (CPT 32550)  Indication: maligant pleural effusion  Consent: Obtained  Surgeon: Ander Slade  Anesthesia:   Procedure  Ultrasound of the chest was performed prior to initiating procedure  Did not find an adequate pocket of fluid to introduce the Pleurx catheter in to.  Procedure was terminated   Complications: none immediate

## 2020-03-25 DIAGNOSIS — J189 Pneumonia, unspecified organism: Secondary | ICD-10-CM | POA: Diagnosis present

## 2020-03-25 DIAGNOSIS — I313 Pericardial effusion (noninflammatory): Secondary | ICD-10-CM

## 2020-03-25 DIAGNOSIS — C801 Malignant (primary) neoplasm, unspecified: Secondary | ICD-10-CM

## 2020-03-25 LAB — GLUCOSE, CAPILLARY
Glucose-Capillary: 165 mg/dL — ABNORMAL HIGH (ref 70–99)
Glucose-Capillary: 245 mg/dL — ABNORMAL HIGH (ref 70–99)

## 2020-03-25 MED ORDER — MONTELUKAST SODIUM 10 MG PO TABS: 10.0000 mg | ORAL_TABLET | Freq: Every day | ORAL | 3 refills | Status: AC

## 2020-03-25 MED ORDER — AMOXICILLIN-POT CLAVULANATE 875-125 MG PO TABS: 1.0000 | ORAL_TABLET | Freq: Two times a day (BID) | ORAL | 0 refills | Status: DC

## 2020-03-25 NOTE — Progress Notes (Signed)
Physical Therapy Treatment Patient Details Name: Wendy [name] MRN: [id number] DOB: 07/22/1941 Today's Date: 03/25/2020    History of Present Illness Wendy [name] is a 79 y.o. female with medical history significant of history significant of metastatic breast cancer to lung bone lymph nodes and liver in April 2021, s/p chemo, recurrent right-sided malignant pleural effusion status post thoracentesis x2 on May 25 and March 18 2020, anemia, anxiety, depression, metastatic breast cancer currently on chemo, CKD, hypertension, hyperlipidemia, GERD, insulin-dependent diabetes presenting with complaints of shortness of breath.  She was treated for pneumonia as outpatient and had thoracentesis on 6/17, but continued with SOB.  Pt admitted with resp failure and recurrent malignant pleural effusion.    PT Comments    PT demonstrating gradual progress.  Able to increase gait distance to 61' with RW and min guard.  Required guarding for balance and oxygen monitoring.  O2 sats were stable on 5-6 LPM O2 with rest and activity. Cont POC.    Follow Up Recommendations  Home health PT;Supervision for mobility/OOB;Supervision/Assistance - 24 hour     Equipment Recommendations  Rolling walker with 5" wheels    Recommendations for Other Services       Precautions / Restrictions Precautions Precautions: Fall Precaution Comments: O2 dependent, watch SpO2    Mobility  Bed Mobility Overal bed mobility: Needs Assistance Bed Mobility: Supine to Sit     Supine to sit: Min assist;HOB elevated     General bed mobility comments: Min A for hand to pull up on ; otherwise supervision  Transfers Overall transfer level: Needs assistance Equipment used: Rolling walker (2 wheeled) Transfers: Sit to/from Stand Sit to Stand: Min guard         General transfer comment: min guard for safety  Ambulation/Gait Ambulation/Gait assistance: Min guard Gait Distance (Feet): 60 Feet Assistive device: Rolling walker  (2 wheeled) Gait Pattern/deviations: Step-through pattern;Decreased stride length;Wide base of support Gait velocity: reduced   General Gait Details: min guard for safety and balance; monitored O2 throughout   Stairs             Wheelchair Mobility    Modified Rankin (Stroke Patients Only)       Balance Overall balance assessment: Needs assistance Sitting-balance support: Feet supported Sitting balance-Leahy Scale: Good     Standing balance support: Bilateral upper extremity supported Standing balance-Leahy Scale: Fair Standing balance comment: pt able to lift UE from walker without LOB                            Cognition Arousal/Alertness: Awake/alert Behavior During Therapy: WFL for tasks assessed/performed Overall Cognitive Status: Within Functional Limits for tasks assessed                                        Exercises      General Comments General comments (skin integrity, edema, etc.): Pt on 5 LPM O2 at arrival with sats 99%.  Ambulated on 6 LPM O2 with sats 99% and DOE 2/4.  Pt had reported she was on 5 LPM at home; however, she misunderstood the question.  When RN arrived reports on 2 LPM O2 and pt agreed.  RN present, PT decreased to 2 LPM O2 and pt maintains 96% after 2 mins rest.      Pertinent Vitals/Pain Pain Assessment: No/denies pain    Home  Living                      Prior Function            PT Goals (current goals can now be found in the care plan section) Acute Rehab PT Goals Patient Stated Goal: breathe better PT Goal Formulation: With patient Time For Goal Achievement: 04/06/20 Potential to Achieve Goals: Fair Progress towards PT goals: Progressing toward goals    Frequency    Min 3X/week      PT Plan Current plan remains appropriate    Co-evaluation              AM-PAC PT "6 Clicks" Mobility   Outcome Measure  Help needed turning from your back to your side while in a  flat bed without using bedrails?: A Little Help needed moving from lying on your back to sitting on the side of a flat bed without using bedrails?: A Little Help needed moving to and from a bed to a chair (including a wheelchair)?: A Little Help needed standing up from a chair using your arms (e.g., wheelchair or bedside chair)?: None Help needed to walk in hospital room?: None Help needed climbing 3-5 steps with a railing? : A Little 6 Click Score: 20    End of Session Equipment Utilized During Treatment: Gait belt;Oxygen Activity Tolerance: Patient limited by fatigue Patient left: with call bell/phone within reach;in chair;with chair alarm set Nurse Communication: Mobility status;Other (comment) (O2 sats) PT Visit Diagnosis: Muscle weakness (generalized) (M62.81);Other abnormalities of gait and mobility (R26.89)     Time: 1240-1300 PT Time Calculation (min) (ACUTE ONLY): 20 min  Charges:  $Gait Training: 8-22 mins                     Abran Richard, PT Acute Rehab Services Pager [phone] [facility] [place] 03/25/2020, 1:30 PM

## 2020-03-25 NOTE — TOC Progression Note (Signed)
Transition of Care [facility]) - Progression Note    Patient Details  Name: Wendy [name] MRN: [id number] Date of Birth: 04/05/1941  Transition of Care [facility]) CM/SW [place], [place] Phone Number: 03/25/2020, 2:11 PM  Clinical Narrative:     CSW met with pt for [facility] consult. Pt is agreeable to [facility] services and has no preference for agency. States she lives at home with her son. Already has a walker and 3 in 1. Pt agreeable to CSW contacting her son Wendy [name]. CSW confirmed family okay with [place] and has no preference for [facility] agency. CSW arranged HH with Malachy Mood from [place]; PT/RN/NA     Barriers to Discharge: No Barriers Identified  Expected Discharge Plan and [place] with Home health        Expected Discharge Date: 03/25/20                         HH Arranged: PT, RN, Nurse's Aide HH Agency: [place] Date Ama: 03/25/20   Representative spoke with at [place]: [place] ([place]) Interventions    Readmission Risk Interventions No flowsheet data found.

## 2020-03-25 NOTE — H&P (Addendum)
NAME:  Wendy [name], MRN:  [id number], DOB:  [birth year], LOS: 3 ADMISSION DATE:  03/22/2020, CONSULTATION DATE:  03/23/2020 REFERRING MD:  Dr Roosevelt Locks, CHIEF COMPLAINT:  Shortness of breath  Brief History    79 year old lady with metastatic breast cancer, ongoing chemotherapy Recently had thoracentesis performed for 400 cc from right pleural space She had had thoracentesis a few weeks earlier History of present illness   Worsening shortness of breath over the last couple of days Was recently treated for pneumonia-used Levaquin Activated EMS secondary to worsening shortness of breath She is coughing, wheezing, just feels exhausted  Past Medical History   Past Medical History:  Diagnosis Date  . Abnormal breast finding 2018   per pt/ having  a lot drainage from left breast nipple  . Anemia   . Anxiety   . Breast cancer ([facility]) 11/2016   left/  . CKD (chronic kidney disease)   . Depression   . GERD (gastroesophageal reflux disease)    TUMS as needed  . HTN (hypertension)    states BP has been high recently; has been on med. x 20 yr.  . Hyperlipidemia   . Hyperplastic colon polyp   . HYPERTENSION, BENIGN SYSTEMIC 11/29/2006   Lisinopril hctz 10-12.5mg , amlodipine 10mg , coreg 25mg  BID, clonidine 0.3 mg BID.   Marland Kitchen Insulin dependent diabetes mellitus   . Personal history of chemotherapy    2018/finished 6 weeks of chemo in Sep 2018  . Personal history of radiation therapy    2018 left breast/finished radiation in Sept 2018 per pt.   Significant Hospital Events   Breathing feels about the same Consults:  pccm  Procedures:  03/24/2020 unable to place Pleurx catheter due to lack of fluid  Significant Diagnostic Tests:  Echocardiogram 02/05/2020 Ejection fraction of 60 to 65%, does show diastolic dysfunction, mild pulmonary hypertension   CT scan of the chest 03/22/2020 IMPRESSION: 1. Negative examination for pulmonary embolism. 2. Moderate right, small left pleural effusions,  increased compared to prior CT dated 03/02/2020. 3. Numerous bilateral pulmonary nodules, in keeping with metastatic breast malignancy. 4. Trace pericardial effusion, unchanged compared to prior examination. 5. Soft tissue mass in the lateral left breast and axilla, in keeping with known breast malignancy.  Micro Data:  6/21 blood cultures x2 6/21 urine culture negative to date Antimicrobials:  Augmentin  Interim history/subjective:  No significant change had multiple thoracentesis in the past unable to place Pleurx due to lack of accumulation of pleural fluid should be followed up in the office with chest x-ray.  Objective   Blood pressure (!) 135/51, pulse 76, temperature 98.7 F (37.1 C), resp. rate 19, SpO2 100 %.        Intake/Output Summary (Last 24 hours) at 03/25/2020 1132 Last data filed at 03/24/2020 2113 Gross per 24 hour  Intake 120 ml  Output 400 ml  Net -280 ml   There were no vitals filed for this visit.  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  Therefore will have her follow-up in  office in approximately 1 week check a chest x-ray and fluid reaccumulate we will place Pleurx catheter. She has an appointment set up for June 29 at neuro [phone] with [facility] pulmonary with Geraldo Pitter nurse practitioner with a chest x-ray.  Richardson Landry Nataliya Graig ACNP Acute Care Nurse Practitioner [facility] Please consult Amion 03/25/2020, 11:32 AM

## 2020-03-25 NOTE — Discharge Summary (Addendum)
Discharge Summary  Wendy [name] ZOX:[id number] DOB: Apr 29, 1941  PCP: Marin Olp, MD  Admit date: 03/22/2020 Discharge date: 03/25/2020  Time spent: 25 minutes  Recommendations for Outpatient Follow-up:  1. Medication change: Levaquin discontinued and patient will continue on Augmentin for 2 more days. 2. Patient will follow up with CCM on Tuesday morning.  At that time, Pleurx catheter placement will be discussed  Discharge Diagnoses:  Active Hospital Problems   Diagnosis Date Noted  . Acute respiratory failure with hypoxia ([place]) 03/22/2020  . DNR (do not resuscitate) 03/24/2020  . 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 Metastatic breast cancer ([place]) 03/02/2020  . GERD (gastroesophageal reflux disease) 01/31/2020  . Edema 03/14/2017  . Shortness of breath 03/14/2017  . Malignant neoplasm of upper-outer quadrant of left breast in female, estrogen receptor positive ([place]) 11/20/2016  . CKD (chronic kidney disease) stage 3, GFR 30-59 ml/min (HCC) 03/17/2016  . 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 TOBACCO USE 04/13/2008  . Morbid obesity ([place]) 11/29/2006  . Hyperlipidemia associated with type 2 diabetes mellitus ([place]) 11/29/2006  . Essential hypertension 11/29/2006    Resolved Hospital Problems  No resolved problems to display.    Discharge Condition: Improved, being discharged to home  Diet recommendation: Carb modified/low-sodium  Vitals:   03/25/20 0726 03/25/20 0730  BP:  (!) 135/51  Pulse: 77 76  Resp: 18 19  Temp:  98.7 F (37.1 C)  SpO2: 99% 100%    History of present illness:  79 year old female with past medical history of metastatic breast cancer currently on chemotherapy, stage III chronic kidney disease, morbid obesity and diabetes plus hypertension admitted on 6/21 with worsening  shortness of breath.  Patient had been admitted 3 weeks prior to the hospitalist service for asthma exacerbation and bilateral pleural effusions and treated with thoracentesis.  Recurrent pleural effusions felt to be secondary to recurring breast cancer.  Right-sided pleural effusion noted on chest x-ray.  Discussed with pulmonary with plans for Pleurx catheter.    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  Breast cancer felt to be treatable with chemo but not curable.  Patient wishes to stay DNR and otherwise want full measures.  Patient was taken down this afternoon for Pleurx catheter placement, however ultrasound of the chest was done prior to initiating procedure an adequate pocket of fluid introduced the Pleurx catheter into was not found.  The procedure was terminated and this was discussed with the patient that the best plan was to likely wait until fluid was to reaccumulate.  Patient stable by 6/24.  She will be discharged home and follow-up with pulmonary as outpatient on 6/29.  At that time, Pleurx catheter placement will be assessed and planned. Active Problems:   Hyperlipidemia associated with uncontrolled type 2 diabetes mellitus with severe nonproliferative retinopathy and macular edema, with long-term current use of insulin (HCC)(HCC): Following CBGs.  Community-acquired pneumonia: Infiltrate also noted on x-ray.  She previously had been on Levaquin as outpatient.  This was changed over to Augmentin.  She be discharged home with 2 more days to complete a 5-day course of Augmentin.  Levaquin discontinued.    Morbid obesity ([place]): Patient is criteria for BMI greater than 35+ comorbidities of diabetes and hypertension.  Essential hypertension: Blood pressure stable, continue home medications.    Diastolic dysfunction:  Appears to be stable    CKD (chronic kidney disease) stage 3, GFR 30-59 ml/min (HCC): Appears to be at baseline   Malignant neoplasm of upper-outer quadrant of left breast in female, estrogen receptor positive [facility])  Consultants:  Pulmonary  Palliative care  Procedures:  Attempted Pleurx catheter placement on 6/23-unable to place  Echocardiogram 6/22: Preserved ejection fraction.  Unable to assess diastolic dysfunction.  Trivial pericardial effusion noted  Discharge Exam: BP (!) 135/51   Pulse 76   Temp 98.7 F (37.1 C)   Resp 19   LMP  (LMP Unknown)   SpO2 100%   General: Alert and oriented x3, no acute distress Cardiovascular: Regular rate and rhythm, S1-S2 Respiratory: Clear to auscultation bilaterally  Discharge Instructions You were cared for by a hospitalist during your hospital stay. If you have any questions about your discharge medications or the care you received while you were in the hospital after you are discharged, you can call the unit and asked to speak with the hospitalist on call if the hospitalist that took care of you is not available. Once you are discharged, your primary care physician will handle any further medical issues. Please note that NO REFILLS for any discharge medications will be authorized once you are discharged, as it is imperative that you return to your primary care physician (or establish a relationship with a primary care physician if you do not have one) for your aftercare needs so that they can reassess your need for medications and monitor your lab values.  Discharge Instructions    Diet - low sodium heart healthy   Complete by: As directed    Increase activity slowly   Complete by: As directed      Allergies as of 03/25/2020   No Known Allergies     Medication List    STOP taking these medications   levofloxacin 500 MG tablet Commonly known as: Levaquin     TAKE these medications   albuterol 108 (90 Base) MCG/ACT  inhaler Commonly known as: VENTOLIN HFA Inhale 2 puffs into the lungs every 6 (six) hours as needed for wheezing or shortness of breath.   amLODipine 10 MG tablet Commonly known as: NORVASC TAKE 1 TABLET BY MOUTH EVERY DAY   amoxicillin-clavulanate 875-125 MG tablet Commonly known as: AUGMENTIN Take 1 tablet by mouth every 12 (twelve) hours.   Insulin Syringe-Needle U-100 30G X 1/2" 0.5 ML Misc Commonly known as: B-D INS SYRINGE 0.5CC/30GX1/2" USE AS DIRECTED 4 TIMES A DAY   B-D INS SYRINGE 0.5CC/31GX5/16 31G X 5/16" 0.5 ML Misc Generic drug: Insulin Syringe-Needle U-100 USE AS DIRECTED 4 TIMES A DAY   Breo Ellipta 100-25 MCG/INH Aepb Generic drug: fluticasone furoate-vilanterol Inhale 1 puff into the lungs daily.   busPIRone 10 MG tablet Commonly known as: BUSPAR Take 1 tablet (10 mg total) by mouth 3 (three) times daily.   carvedilol 25 MG tablet Commonly known as: COREG Take 1 tablet (25 mg total) by mouth 2 (two) times daily with a meal.   cloNIDine 0.3 MG tablet Commonly known as: CATAPRES TAKE 1 TABLET (0.3 MG TOTAL) BY MOUTH 2 (TWO) TIMES DAILY.   famotidine 20 MG tablet Commonly known as: PEPCID TAKE 1 TABLET BY MOUTH EVERY DAY   fluticasone 50 MCG/ACT nasal spray Commonly known as: FLONASE Place 1 spray into both nostrils daily.   furosemide 20 MG tablet Commonly known as: LASIX  Take 20 mg by mouth daily.   HYDROcodone-acetaminophen 5-325 MG tablet Commonly known as: NORCO/VICODIN Take 1 tablet by mouth every 8 (eight) hours as needed for moderate pain.   Insulin Pen Needle 31G X 5 MM Misc Use pen needles for insulin injection daily   insulin regular 100 units/mL injection Commonly known as: NOVOLIN R Inject 0.14-0.18 mLs (14-18 Units total) into the skin 3 (three) times daily before meals. Relion. Please provide insulin syringes needed What changed:   how much to take  when to take this  additional instructions   Lantus SoloStar 100 UNIT/ML  Solostar Pen Generic drug: insulin glargine Inject 28 units into the skin in am What changed:   how much to take  how to take this  when to take this  additional instructions   lisinopril-hydrochlorothiazide 20-12.5 MG tablet Commonly known as: ZESTORETIC Take 1 tablet by mouth daily.   lovastatin 40 MG tablet Commonly known as: MEVACOR TAKE 1 TABLET BY MOUTH EVERYDAY AT BEDTIME   montelukast 10 MG tablet Commonly known as: Singulair Take 1 tablet (10 mg total) by mouth at bedtime.   ondansetron 8 MG tablet Commonly known as: Zofran Take 1 tablet (8 mg total) by mouth 2 (two) times daily as needed (Nausea or vomiting).   polyethylene glycol powder 17 GM/SCOOP powder Commonly known as: GLYCOLAX/MIRALAX Take 17 g by mouth daily. What changed:   when to take this  reasons to take this   traMADol 50 MG tablet Commonly known as: ULTRAM TAKE 1 TABLET BY MOUTH EVERY 6 HOURS AS NEEDED FOR MODERATE/SEVERE PAIN (NECK AND SHOULDER PAIN).   Xeloda 500 MG tablet Generic drug: capecitabine Take 3 tablets (1,500 mg total) by mouth 2 (two) times daily after a meal. Take for 14 days, then hold for 7 days. Repeat every 21 days.      No Known Allergies  Follow-up Information    Martyn Ehrich, NP Follow up on 03/30/2020.   Specialty: Pulmonary Disease Why: NP Volanda Napoleon at 900am with chest xray Contact information: [address] [place] [address] [phone]                The results of significant diagnostics from this hospitalization (including imaging, microbiology, ancillary and laboratory) are listed below for reference.    Significant Diagnostic Studies: CT ABDOMEN PELVIS WO CONTRAST  Result Date: 03/02/2020 CLINICAL DATA:  Abdominal distension. History of malignancy. Evaluate for possible bowel obstruction. EXAM: CT ABDOMEN AND PELVIS WITHOUT CONTRAST TECHNIQUE: Multidetector CT imaging of the abdomen and pelvis was performed following the  standard protocol without IV contrast. COMPARISON:  Chest CT, same date and prior abdominal CT scan 01/17/2018 FINDINGS: Lower chest: There are bilateral pleural effusions and numerous small metastatic pulmonary nodules. The heart is normal in size. Small amount of pericardial fluid without overt effusion. Hepatobiliary: 3 cm segment 6 liver lesion again demonstrated. No other obvious hepatic lesions without contrast. The gallbladder appears normal. No intra or extrahepatic biliary dilatation. Pancreas: Advanced pancreatic atrophy but no mass or inflammation. Spleen: Normal size.  No focal lesions. Adrenals/Urinary Tract: The adrenal glands are unremarkable. Some residual contrast in the collecting system of both kidneys from the recent chest CT. Prior numerous bilateral renal cysts but no worrisome renal lesions or hydronephrosis. The bladder contains contrast. No bladder mass or asymmetric bladder wall thickening. A mild to moderate cystocele is noted. Stomach/Bowel: The stomach, duodenum, small bowel and colon are unremarkable. No acute inflammatory changes, mass lesions  or obstructive findings. The terminal ileum and appendix are normal. Contrast gets all the way to the colon and there are no dilated small bowel loops. Vascular/Lymphatic: Scattered atherosclerotic calcifications involving the aorta and iliac arteries but no aneurysm. No mesenteric or retroperitoneal mass or lymphadenopathy. Reproductive: Enlarged fibroid uterus with calcified fibroids. The ovaries are normal. Other: No abdominal wall hernia or subcutaneous lesions are identified. No free abdominal/pelvic fluid collections. Musculoskeletal: No significant bony findings. IMPRESSION: 1. No acute abdominal/pelvic findings or lymphadenopathy. No findings for small bowel obstruction. 2. 3 cm segment 6 liver lesion consistent with metastatic disease. 3. Enlarged fibroid uterus with calcified fibroids. 4. Bilateral pleural effusions and numerous small  metastatic pulmonary nodules. Aortic Atherosclerosis (ICD10-I70.0). Electronically Signed   By: Marijo Sanes M.D.   On: 03/02/2020 10:58   DG Chest 1 View  Result Date: 03/18/2020 CLINICAL DATA:  79 year old female status post right thoracentesis. EXAM: CHEST  1 VIEW COMPARISON:  Chest x-ray 03/02/2020. FINDINGS: No pneumothorax. Small left pleural effusion. No definite right pleural effusion. Patchy multifocal interstitial and airspace disease noted throughout the mid to lower lungs bilaterally. No evidence of pulmonary edema. Heart size is normal. Upper mediastinal contours are within normal limits. Surgical clips in the left axillary region, likely from prior lymph node dissection. IMPRESSION: 1. No pneumothorax in this patient status post thoracentesis. 2. Small left pleural effusion. 3. Multilobar bilateral pneumonia throughout the mid to lower lungs bilaterally. Electronically Signed   By: Vinnie Langton M.D.   On: 03/18/2020 11:21   DG Chest 1 View  Result Date: 03/02/2020 CLINICAL DATA:  Status post thoracentesis. Additional history provided: Post right thoracentesis, patient still having some shortness of breath, no unusual pain. EXAM: CHEST  1 VIEW COMPARISON:  CT angiogram chest 10/03/2019, chest radiograph 03/01/2020. FINDINGS: Unchanged cardiomediastinal silhouette. Interval decrease in size of a right pleural effusion consistent with provided history of interval thoracentesis. Unchanged trace left pleural effusion. Multiple pulmonary nodules were better appreciated on chest CT performed earlier the same day. No evidence of pneumothorax. Surgical clips within the left axilla. Known osseous metastatic disease better appreciated on same-day chest CT. IMPRESSION: Interval decrease in conspicuity of a right pleural effusion consistent with interval thoracentesis. No evidence of pneumothorax. Additional findings without interval change as described. Electronically Signed   By: Kellie Simmering DO   On:  03/02/2020 14:12   DG Chest 2 View  Result Date: 03/01/2020 CLINICAL DATA:  Patient reports sob and cough with chest congestion for 3 months. Reports thoracentesis 2 days ago. EXAM: CHEST - 2 VIEW COMPARISON:  Chest radiograph 01/26/2020, 02/24/2020 FINDINGS: Stable cardiomediastinal contours. There is a small right pleural effusion with adjacent opacities possibly representing atelectasis. There is a small nonspecific opacity in the right mid lung which appears new from prior. The left lung is clear. No pneumothorax. No acute finding in the visualized skeleton. IMPRESSION: 1. Small right pleural effusion with adjacent opacities possibly representing atelectasis. 2. New small focal opacity in the right mid lung is nonspecific, possibly atelectasis though early infection not excluded. Electronically Signed   By: Audie Pinto M.D.   On: 03/01/2020 17:50   CT Angio Chest PE W and/or Wo Contrast  Result Date: 03/22/2020 CLINICAL DATA:  Shortness of breath, history of metastatic breast cancer and pleural effusions EXAM: CT ANGIOGRAPHY CHEST WITH CONTRAST TECHNIQUE: Multidetector CT imaging of the chest was performed using the standard protocol during bolus administration of intravenous contrast. Multiplanar CT image reconstructions and MIPs were obtained to  evaluate the vascular anatomy. CONTRAST:  6mL OMNIPAQUE IOHEXOL 350 MG/ML SOLN COMPARISON:  CT chest angiogram, 03/02/2020 FINDINGS: Cardiovascular: Satisfactory opacification of the pulmonary arteries to the segmental level. No evidence of pulmonary embolism. Normal heart size. Trace pericardial effusion. Mediastinum/Nodes: No enlarged mediastinal, hilar, or axillary lymph nodes. Thyroid gland, trachea, and esophagus demonstrate no significant findings. Lungs/Pleura: Moderate right, small left pleural effusions increased compared to prior CT dated 03/02/2020. Numerous bilateral pulmonary nodules. Upper Abdomen: No acute abnormality. Musculoskeletal:  Soft tissue mass in the lateral left breast and axilla. No acute or significant osseous findings. Review of the MIP images confirms the above findings. IMPRESSION: 1. Negative examination for pulmonary embolism. 2. Moderate right, small left pleural effusions, increased compared to prior CT dated 03/02/2020. 3. Numerous bilateral pulmonary nodules, in keeping with metastatic breast malignancy. 4. Trace pericardial effusion, unchanged compared to prior examination. 5. Soft tissue mass in the lateral left breast and axilla, in keeping with known breast malignancy. Electronically Signed   By: Eddie Candle M.D.   On: 03/22/2020 15:09   CT Angio Chest PE W and/or Wo Contrast  Result Date: 03/02/2020 CLINICAL DATA:  Shortness of breath, history of recent thoracentesis EXAM: CT ANGIOGRAPHY CHEST WITH CONTRAST TECHNIQUE: Multidetector CT imaging of the chest was performed using the standard protocol during bolus administration of intravenous contrast. Multiplanar CT image reconstructions and MIPs were obtained to evaluate the vascular anatomy. CONTRAST:  84mL OMNIPAQUE IOHEXOL 350 MG/ML SOLN COMPARISON:  Chest x-ray from the previous day, CT from 10/31/2019, PET-CT from 01/07/2020 FINDINGS: Cardiovascular: Thoracic aorta and its branches are well visualize without aneurysmal dilatation or dissection. No cardiac enlargement is seen. Mild pericardial effusion is noted. The pulmonary artery shows a normal branching pattern. No definitive filling defect to suggest pulmonary embolism is seen. Mediastinum/Nodes: Thoracic inlet shows lymphadenopathy in the left supraclavicular region as well as posterior to the clavicle on the left. Scattered mediastinal lymph nodes are noted in the pre-vascular region as well as AP window slightly increased when compared with the prior exam. Additionally there are hilar lymph nodes identified as well as subcarinal adenopathy increased from the prior study. Subcarinal lymph node measures at  least 2.1 cm in short axis and previously measured approximately 1 cm in short axis on prior PET-CT. Right hilar lymph node measures 2.1 cm in short axis also increased from the prior study. Significant bulky adenopathy is noted in the left axilla as well as a focal soft tissue mass lesion which measures 5.1 by 2.5 cm best seen on image number 136 of series 7. This is consistent with recurrent disease and is similar to that seen on recent PET-CT. Bulky lymph nodes are noted associated with this mass which appear increased when compared with the prior exam consistent with progression of disease. Multiple subpectoral lymph nodes are noted on the left also consistent with progressive disease. Lungs/Pleura: Small left pleural effusion is noted consistent with the recent thoracentesis. Moderate right-sided pleural effusion is noted. The lungs demonstrate multiple parenchymal nodules which have increased in size and number when compared with the prior exam. Largest of these on the left is posterior along the pleural margin measuring 1.9 cm. The largest of these on the right is seen in the upper lobe measuring approximately 11 mm. Right lower lobe consolidation is noted of a mild degree. Upper Abdomen: Visualized upper abdomen demonstrates multiple renal cysts. Additionally there is a rounded hypodensity in the posterior aspect of the right lobe of the liver best seen  on image number 354 of series 7 consistent with metastatic disease. This is stable from the prior PET-CT. Musculoskeletal: Degenerative changes of the thoracic spine are noted. Medial scapular metastatic lesion is noted on the left similar to that seen on prior PET-CT. Pathologic fracture is again noted involving the right eighth rib posteriorly. Some mottled areas of bony density are noted within the ribcage consistent with metastatic disease. Review of the MIP images confirms the above findings. IMPRESSION: No evidence of pulmonary emboli. Progression of  disease particularly within the lungs, hila and mediastinum consistent with the known history of breast carcinoma with recurrence. Persistent left breast mass is noted with increasing left axillary and left chest wall lymphadenopathy. Bilateral pleural effusions right greater than left. Bony metastatic disease. Electronically Signed   By: Inez Catalina M.D.   On: 03/02/2020 01:41   DG Chest Portable 1 View  Result Date: 03/22/2020 CLINICAL DATA:  Shortness of breath.  Metastatic breast carcinoma. EXAM: PORTABLE CHEST 1 VIEW COMPARISON:  03/18/2020 FINDINGS: Moderate cardiomegaly appears mildly increased since previous study. Increased diffuse interstitial infiltrates may be due to interstitial edema. New confluent opacity is seen in the right midlung, suspicious for pneumonia. Left lower lobe atelectasis or infiltrate shows no significant change. Probable small bilateral pleural effusions noted. Small pulmonary nodules are noted, but less well visualized than on prior exam. Multiple surgical clips again seen in the left axilla. IMPRESSION: 1. New confluent opacity in right midlung, suspicious for pneumonia. 2. Mild increase in cardiomegaly and diffuse interstitial infiltrates, suspicious for congestive heart failure. 3. Stable left lower lobe atelectasis versus infiltrate. Probable small bilateral pleural effusions. 4. Pulmonary metastases are less well visualized than on previous study. Electronically Signed   By: Marlaine Hind M.D.   On: 03/22/2020 12:03   DG Chest Port 1 View  Result Date: 02/24/2020 CLINICAL DATA:  Post left thoracentesis EXAM: PORTABLE CHEST 1 VIEW COMPARISON:  01/26/2020 FINDINGS: No residual pleural effusion on the left. No complication or pneumothorax Small right pleural effusion slightly increased from the prior study. Mild right lower lobe atelectasis. Negative for heart failure or edema Left mastectomy with clips in the left axilla IMPRESSION: No significant left pleural effusion  following thoracentesis. No pneumothorax Progression of small right effusion and right lower lobe atelectasis since 01/26/2020. Electronically Signed   By: Franchot Gallo M.D.   On: 02/24/2020 16:23   VAS [name] LOWER EXTREMITY VENOUS (DVT)  Result Date: 03/02/2020  Lower Venous DVTStudy Indications: Pain, and Edema.  Limitations: Pain, poor ultrasound/tissue interface and body habitus. Performing Technologist: Antonieta Pert RDMS, RVT  Examination Guidelines: A complete evaluation includes B-mode imaging, spectral Doppler, color Doppler, and power Doppler as needed of all accessible portions of each vessel. Bilateral testing is considered an integral part of a complete examination. Limited examinations for reoccurring indications may be performed as noted. The reflux portion of the exam is performed with the patient in reverse Trendelenburg.  +---------+---------------+---------+-----------+----------+-------------------+ RIGHT    CompressibilityPhasicitySpontaneityPropertiesThrombus Aging      +---------+---------------+---------+-----------+----------+-------------------+ CFV      Full           Yes      Yes                                      +---------+---------------+---------+-----------+----------+-------------------+ SFJ      Full                                                             +---------+---------------+---------+-----------+----------+-------------------+  FV Prox  Full                                                             +---------+---------------+---------+-----------+----------+-------------------+ FV Mid   Full                                                             +---------+---------------+---------+-----------+----------+-------------------+ FV Distal               Yes      Yes                  could not tolerate                                                        compression          +---------+---------------+---------+-----------+----------+-------------------+ PFV      Full                                                             +---------+---------------+---------+-----------+----------+-------------------+ POP                                                   Not visualized      +---------+---------------+---------+-----------+----------+-------------------+ PTV                     Yes                           could not tolerate                                                        compression         +---------+---------------+---------+-----------+----------+-------------------+ PERO                    Yes                           could not tolerate                                                        compression         +---------+---------------+---------+-----------+----------+-------------------+ GSV  Full                                                             +---------+---------------+---------+-----------+----------+-------------------+   +---------+---------------+---------+-----------+----------+-------------------+ LEFT     CompressibilityPhasicitySpontaneityPropertiesThrombus Aging      +---------+---------------+---------+-----------+----------+-------------------+ CFV      Full           Yes      Yes                                      +---------+---------------+---------+-----------+----------+-------------------+ SFJ      Full                                                             +---------+---------------+---------+-----------+----------+-------------------+ FV Prox                 Yes                           could not tolerate                                                        compression         +---------+---------------+---------+-----------+----------+-------------------+ FV Mid                  Yes                           could not tolerate                                                         compression         +---------+---------------+---------+-----------+----------+-------------------+ FV Distal               Yes      Yes                  could not tolerate                                                        compression         +---------+---------------+---------+-----------+----------+-------------------+ POP                     Yes      Yes                  could not tolerate  compression         +---------+---------------+---------+-----------+----------+-------------------+ PTV                     Yes                           could not tolerate                                                        compression         +---------+---------------+---------+-----------+----------+-------------------+ PERO                    Yes                           could not tolerate                                                        compression         +---------+---------------+---------+-----------+----------+-------------------+ GSV      Full                                                             +---------+---------------+---------+-----------+----------+-------------------+     Summary: RIGHT: - There is no evidence of deep vein thrombosis in the lower extremity. However, portions of this examination were limited- see technologist comments above.  - No cystic structure found in the popliteal fossa.  LEFT: - There is no evidence of deep vein thrombosis in the lower extremity. However, portions of this examination were limited- see technologist comments above.  - No cystic structure found in the popliteal fossa. - Patient could not tolerate compression and almost did not tolerate any touch. best images possible.  *See table(s) above for measurements and observations. Electronically signed by Deitra Mayo MD on  03/02/2020 at 6:04:43 PM.    Final    ECHOCARDIOGRAM LIMITED  Result Date: 03/23/2020    ECHOCARDIOGRAM REPORT   Patient Name:   PATRIECE ARCHBOLD Date of Exam: 03/23/2020 Medical Rec #:  [id number]    Height:       62.0 in Accession #:    [id number]   Weight:       200.4 lb Date of Birth:  1940/12/23    BSA:          1.913 m Patient Age:    7 years     BP:           163/101 mmHg Patient Gender: F            HR:           100 bpm. Exam Location:  Inpatient Procedure: Limited Echo, Cardiac Doppler and Color Doppler Indications:     Pericardial effusion 423.9 / I31.3  History:         Patient has prior history of Echocardiogram examinations, most  recent 02/05/2020. Risk Factors:Diabetes, Hypertension and                  Dyslipidemia. Multiple metastatic breast cancer, ,metastatic                  pleural effusion, GERD, Chronic kidney disease.  Sonographer:     Darlina Sicilian RDCS Referring Phys:  [id number] Lequita Halt Diagnosing Phys: Fransico Him MD IMPRESSIONS  1. Left ventricular ejection fraction, by estimation, is 55 to 60%. The left ventricle has normal function. Left ventricular endocardial border not optimally defined to evaluate regional wall motion. Left ventricular diastolic function could not be evaluated.  2. Right ventricular systolic function was not well visualized. The right ventricular size is not well visualized. There is moderately elevated pulmonary artery systolic pressure. The estimated right ventricular systolic pressure is 45.4 mmHg.  3. The mitral valve is normal in structure. No evidence of mitral valve regurgitation. No evidence of mitral stenosis.  4. The aortic valve is normal in structure. Aortic valve regurgitation is not visualized. No aortic stenosis is present.  5. The inferior vena cava is dilated in size with >50% respiratory variability, suggesting right atrial pressure of 8 mmHg.  6. A small pericardial effusion is present. The pericardial effusion is circumferential.  There is no evidence of cardiac tamponade. FINDINGS  Left Ventricle: Left ventricular ejection fraction, by estimation, is 55 to 60%. The left ventricle has normal function. Left ventricular endocardial border not optimally defined to evaluate regional wall motion. The left ventricular internal cavity size was normal in size. There is no left ventricular hypertrophy. Left ventricular diastolic function could not be evaluated. Right Ventricle: The right ventricular size is not well visualized. Right vetricular wall thickness was not assessed. Right ventricular systolic function was not well visualized. There is moderately elevated pulmonary artery systolic pressure. The tricuspid regurgitant velocity is 2.85 m/s, and with an assumed right atrial pressure of 8 mmHg, the estimated right ventricular systolic pressure is 09.8 mmHg. Left Atrium: Left atrial size was normal in size. Right Atrium: Right atrial size was normal in size. Pericardium: A small pericardial effusion is present. The pericardial effusion is circumferential. There is no evidence of cardiac tamponade. Mitral Valve: The mitral valve is normal in structure. There is mild calcification of the anterior mitral valve leaflet(s). Normal mobility of the mitral valve leaflets. Mild mitral annular calcification. No evidence of mitral valve regurgitation. No evidence of mitral valve stenosis. Tricuspid Valve: The tricuspid valve is normal in structure. Tricuspid valve regurgitation is mild . No evidence of tricuspid stenosis. Aortic Valve: The aortic valve is normal in structure. Aortic valve regurgitation is not visualized. No aortic stenosis is present. Pulmonic Valve: The pulmonic valve was normal in structure. Pulmonic valve regurgitation is not visualized. No evidence of pulmonic stenosis. Aorta: The aortic root is normal in size and structure. Venous: The inferior vena cava is dilated in size with greater than 50% respiratory variability, suggesting right  atrial pressure of 8 mmHg. IAS/Shunts: No atrial level shunt detected by color flow Doppler.  LEFT VENTRICLE PLAX 2D LVIDd:         4.00 cm LVIDs:         2.90 cm LV PW:         1.00 cm LV IVS:        1.10 cm LVOT diam:     2.00 cm LVOT Area:     3.14 cm  LV Volumes (MOD) LV vol d, MOD  A2C: 41.1 ml LV vol d, MOD A4C: 66.2 ml LV vol s, MOD A2C: 24.2 ml LV vol s, MOD A4C: 27.8 ml LV SV MOD A2C:     16.9 ml LV SV MOD A4C:     66.2 ml LV SV MOD BP:      28.0 ml LEFT ATRIUM         Index LA diam:    3.70 cm 1.93 cm/m   AORTA Ao Root diam: 3.20 cm TRICUSPID VALVE TR Peak grad:   32.5 mmHg TR Vmax:        285.00 cm/s  SHUNTS Systemic Diam: 2.00 cm Fransico Him MD Electronically signed by Fransico Him MD Signature Date/Time: 03/23/2020/3:43:48 PM    Final (Updated)    IR THORACENTESIS ASP PLEURAL SPACE W/IMG GUIDE  Result Date: 03/02/2020 INDICATION: SHORTNESS OF BREATH, RIGHT PLEURAL EFFUSION EXAM: ULTRASOUND GUIDED RIGHT THORACENTESIS MEDICATIONS: 1% lidocaine local COMPLICATIONS: None immediate. PROCEDURE: An ultrasound guided thoracentesis was thoroughly discussed with the patient and questions answered. The benefits, risks, alternatives and complications were also discussed. The patient understands and wishes to proceed with the procedure. Written consent was obtained. Ultrasound was performed to localize and mark an adequate pocket of fluid in the right chest. The area was then prepped and draped in the normal sterile fashion. 1% Lidocaine was used for local anesthesia. Under ultrasound guidance a 6 Fr Safe-T-Centesis catheter was introduced. Thoracentesis was performed. The catheter was removed and a dressing applied. FINDINGS: A total of approximately 300 cc of blood tinged pleural fluid was removed. Samples were sent to the laboratory as requested by the clinical team. IMPRESSION: Successful ultrasound guided right thoracentesis yielding 300 cc of pleural fluid. Electronically Signed   By: Jerilynn Mages.  Shick M.D.   On:  03/02/2020 15:02   US Thoracentesis Asp Pleural space w/IMG guide  Result Date: 03/18/2020 INDICATION: Patient with history of metastatic breast cancer, dyspnea, recurrent right pleural effusion. Request made for therapeutic right thoracentesis. EXAM: ULTRASOUND GUIDED THERAPEUTIC RIGHT THORACENTESIS MEDICATIONS: None COMPLICATIONS: None immediate. PROCEDURE: An ultrasound guided thoracentesis was thoroughly discussed with the patient and questions answered. The benefits, risks, alternatives and complications were also discussed. The patient understands and wishes to proceed with the procedure. Written consent was obtained. Ultrasound was performed to localize and mark an adequate pocket of fluid in the right chest. The area was then prepped and draped in the normal sterile fashion. 1% Lidocaine was used for local anesthesia. Under ultrasound guidance a 6 Fr Safe-T-Centesis catheter was introduced. Thoracentesis was performed. The catheter was removed and a dressing applied. FINDINGS: A total of approximately 400 cc of slightly hazy, yellow fluid was removed. IMPRESSION: Successful ultrasound guided therapeutic right thoracentesis yielding 400 cc of pleural fluid. Read by: Rowe Robert, PA-C Electronically Signed   By: Corrie Mckusick D.O.   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 Interventional radiology asked to perform a therapeutic and diagnostic thoracentesis. EXAM: ULTRASOUND GUIDED LEFT THORACENTESIS MEDICATIONS: 1% lidocaine 10 mL COMPLICATIONS: None immediate. PROCEDURE: An ultrasound guided thoracentesis was thoroughly discussed with the patient and questions answered. The benefits, risks, alternatives and complications were also discussed. The patient understands and wishes to proceed with the procedure. Written consent was obtained. Ultrasound was performed to  localize and mark an adequate pocket of fluid in the left chest. The area was then prepped and draped in the  normal sterile fashion. 1% Lidocaine was used for local anesthesia. Under ultrasound guidance a 6 Fr Safe-T-Centesis catheter was introduced. Thoracentesis was performed. The catheter was removed and a dressing applied. FINDINGS: A total of approximately 320 ml of amber fluid was removed. Samples were sent to the laboratory as requested by the clinical team. IMPRESSION: Successful ultrasound guided left thoracentesis yielding 320 ml of pleural fluid. Read by: Soyla Dryer, NP Electronically Signed   By: Jacqulynn Cadet M.D.   On: 02/24/2020 16:27    Microbiology: Recent Results (from the past 240 hour(s))  SARS Coronavirus 2 (TAT 6-24 hrs)     Status: None   Collection Time: 03/17/20 12:30 PM   Specimen: Nasopharyngeal Swab  Result Value Ref Range Status   SARS Coronavirus 2 NEGATIVE NEGATIVE Final    Comment: (NOTE) SARS-CoV-2 target nucleic acids are NOT DETECTED.  The SARS-CoV-2 RNA is generally detectable in upper and lower respiratory specimens during the acute phase of infection. Negative results do not preclude SARS-CoV-2 infection, do not rule out co-infections with other pathogens, and should not be used as the sole basis for treatment or other patient management decisions. Negative results must be combined with clinical observations, patient history, and epidemiological information. The expected result is Negative.  Fact Sheet for Patients: SugarRoll.be  Fact Sheet for Healthcare Providers: https://www.woods-mathews.com/  This test is not yet approved or cleared by the [place] FDA and  has been authorized for detection and/or diagnosis of SARS-CoV-2 by FDA under an Emergency Use Authorization (EUA). This EUA will remain  in effect (meaning this test can be used) for the duration of the COVID-19 declaration under Se  ction 564(b)(1) of the Act, 21 U.S.C. section 360bbb-3(b)(1), unless the authorization is terminated or revoked sooner.  Performed at [facility], [place] [address] [place], [place] [address]   Urine culture     Status: None   Collection Time: 03/22/20 12:34 PM   Specimen: Urine, Clean Catch  Result Value Ref Range Status   Specimen Description URINE, CLEAN CATCH  Final   Special Requests NONE  Final   Culture   Final    NO GROWTH Performed at [facility], [place] [address], [place], [place] [address]    Report Status 03/23/2020 FINAL  Final  SARS Coronavirus 2 by RT PCR (hospital order, performed in [facility] hospital lab) Nasopharyngeal Nasopharyngeal Swab     Status: None   Collection Time: 03/22/20  2:15 PM   Specimen: Nasopharyngeal Swab  Result Value Ref Range Status   SARS Coronavirus 2 NEGATIVE NEGATIVE Final    Comment: (NOTE) SARS-CoV-2 target nucleic acids are NOT DETECTED.  The SARS-CoV-2 RNA is generally detectable in upper and lower respiratory specimens during the acute phase of infection. The lowest concentration of SARS-CoV-2 viral copies this assay can detect is 250 copies / mL. A negative result does not preclude SARS-CoV-2 infection and should not be used as the sole basis for treatment or other patient management decisions.  A negative result may occur with improper specimen collection / handling, submission of specimen other than nasopharyngeal swab, presence of viral mutation(s) within the areas targeted by this assay, and inadequate number of viral copies (<250 copies / mL). A negative result must be combined with clinical observations, patient history, and epidemiological information.  Fact Sheet for Patients:   StrictlyIdeas.no  Fact Sheet for Healthcare Providers: BankingDealers.co.za  This test is not yet approved or  cleared by the [place] FDA  and has been authorized for detection  and/or diagnosis of SARS-CoV-2 by FDA under an Emergency Use Authorization (EUA).  This EUA will remain in effect (meaning this test can be used) for the duration of the COVID-19 declaration under Section 564(b)(1) of the Act, 21 U.S.C. section 360bbb-3(b)(1), unless the authorization is terminated or revoked sooner.  Performed at [facility], [place] [address] [place], [place] [address]   Culture, blood (Routine X 2) w Reflex to ID Panel     Status: None (Preliminary result)   Collection Time: 03/22/20  4:53 PM   Specimen: BLOOD RIGHT ARM  Result Value Ref Range Status   Specimen Description BLOOD RIGHT ARM  Final   Special Requests   Final    BOTTLES DRAWN AEROBIC ONLY Blood Culture adequate volume   Culture   Final    NO GROWTH 3 DAYS Performed at [facility], [place] [address] [place], [place] [address]    Report Status PENDING  Incomplete  Culture, blood (Routine X 2) w Reflex to ID Panel     Status: None (Preliminary result)   Collection Time: 03/22/20  4:58 PM   Specimen: BLOOD  Result Value Ref Range Status   Specimen Description BLOOD RIGHT ANTECUBITAL  Final   Special Requests   Final    BOTTLES DRAWN AEROBIC ONLY Blood Culture adequate volume   Culture   Final    NO GROWTH 3 DAYS Performed at [facility], [place] [address], [place], [place] [address]    Report Status PENDING  Incomplete     Labs: Basic Metabolic Panel: Recent Labs  Lab 03/22/20 1200 03/23/20 0325 03/24/20 0226  NA 136 136 136  K 4.9 4.6 4.4  CL 99 99 98  CO2 28 25 28   GLUCOSE 311* 213* 185*  BUN 19 23 34*  CREATININE 1.41* 1.54* 1.96*  CALCIUM 8.9 9.2 8.5*   Liver Function Tests: No results for input(s): AST, ALT, ALKPHOS, BILITOT, PROT, ALBUMIN in the last 168 hours. No results for input(s): LIPASE, AMYLASE in the last 168 hours. No results for input(s): AMMONIA in the last 168 hours. CBC: Recent Labs  Lab 03/22/20 1200 03/23/20 0325  WBC 8.0 7.4  NEUTROABS 7.5   --   HGB 9.9* 10.8*  HCT 30.0* 31.9*  MCV 89.0 87.6  PLT 279 321   Cardiac Enzymes: No results for input(s): CKTOTAL, CKMB, CKMBINDEX, TROPONINI in the last 168 hours. BNP: BNP (last 3 results) Recent Labs    03/02/20 0932 03/22/20 1200  BNP 41.3 120.3*    ProBNP (last 3 results) Recent Labs    09/15/19 1552  PROBNP CANCELED    CBG: Recent Labs  Lab 03/24/20 0746 03/24/20 1133 03/24/20 1654 03/24/20 2103 03/25/20 0714  GLUCAP 163* 117* 119* 288* 165*       Signed:  Annita Brod, MD [facility] 03/25/2020, 12:18 PM

## 2020-03-27 LAB — CULTURE, BLOOD (ROUTINE X 2)
Culture: NO GROWTH
Culture: NO GROWTH
Special Requests: ADEQUATE
Special Requests: ADEQUATE

## 2020-03-28 DIAGNOSIS — I129 Hypertensive chronic kidney disease with stage 1 through stage 4 chronic kidney disease, or unspecified chronic kidney disease: Secondary | ICD-10-CM | POA: Diagnosis not present

## 2020-03-28 DIAGNOSIS — C7981 Secondary malignant neoplasm of breast: Secondary | ICD-10-CM | POA: Diagnosis not present

## 2020-03-28 DIAGNOSIS — J45901 Unspecified asthma with (acute) exacerbation: Secondary | ICD-10-CM | POA: Diagnosis not present

## 2020-03-28 DIAGNOSIS — E1122 Type 2 diabetes mellitus with diabetic chronic kidney disease: Secondary | ICD-10-CM | POA: Diagnosis not present

## 2020-03-28 DIAGNOSIS — C7951 Secondary malignant neoplasm of bone: Secondary | ICD-10-CM | POA: Diagnosis not present

## 2020-03-28 DIAGNOSIS — C787 Secondary malignant neoplasm of liver and intrahepatic bile duct: Secondary | ICD-10-CM | POA: Diagnosis not present

## 2020-03-30 ENCOUNTER — Telehealth: Payer: Self-pay

## 2020-03-30 ENCOUNTER — Ambulatory Visit: Payer: Medicare Other | Admitting: Primary Care

## 2020-03-30 NOTE — Progress Notes (Deleted)
@Patient  ID: Wendy [name], female    DOB: March 28, 1941, 79 y.o.   MRN: [id number]  No chief complaint on file.   Referring provider: Marin Olp, MD  HPI:  79 year old female, never smoked. PMH significant for left breast cancer, restrictive lung disease, hypertension, diastolic dysfunction, type 2 diabetes, obesity, CKD. Patient of Dr. Vaughan Browner, last seen on 12/03/19. Following with Oncology. Saw pharmacy on 4/12, changing from Breo 200 to Symbicort until she applied to Medicaid. Applied for patient assistance program through [place] through [place].  Diagnosed with left breast cancer in 2018, treated with left lupectomy and SLN sampling, re-excision surgery. Adjuvant TC for 4 cycles and adjuvant radiation. She was on adjuvant Adjuvant  Letrozole from September 2018- November 2020. She had local recurrent in left axilla and underwent left axillary lymph node dissection by Dr. Brantley Stage on 08/05/19. She had complete resection. Continued Exemestaine and added oral biologic Verzenio starting 12/04/19. PET scan 01/07/20 showed wide spread disease with enlarging LNs in change, new lung pleural and parenchymal disease, metastatic disease of right liver lobe and signs of bone metastasis. Due to rapid disease progression Dr. Burr Medico recommended single agent Abraxane for 3-6 months then repeat PET. The goal is palliative, disease control, prolong and improve quality of life.    Previous [facility] pulmonary encounters: 01/30/2020  Patient was seen in ED on 4/26 for shortness of breath. D-dimer was elevated at 20. VQ scan negative for PE. New lung metastasis and hx asthma. Patient had ambulatory walk yesterday with oncology and her oxygen level remained 96-100% on RA. Oncology wanted patient to be seen today to see if her dyspnea was related to asthma exacerbation vs cancer related dyspnea. Her last PFTs were in January 2020.   Shortness of breath has progressively worsened over the last month and a half. States that she  can not get any relief. Feels that she has a lot of congestion in her chest. She has an occasional cough. Unable to get mucus up. Experiences moderate dyspnea with walking. She has some associated wheezing and post nasal drip. She uses BREO in the morning, does not feel that this is helping. She uses albuterol rescue inhaler 2-3 times a week but this does not provider relief. She has been ordered for an echocardiogram with oncology. Next chemotherapy is scheduled for Monday.    02/25/20- Dr. Neville Route on [place] for her asthma.  Ran out of medication yesterday Has significant worsening of her dyspnea since her last visit.  It is unclear if this is recurrence of her asthma. She was following up with oncology for breast cancer recurrence for which she is receiving chemotherapy. Had a VQ scan which was negative and echo showing diastolic dysfunction. Also underwent thoracentesis yesterday with removal of 320 mL of fluid. Started on Lasix for hypertension, lower extremity edema.  03/30/2020 Patient was recently admitted from 6/21-6/24 with worsening shortness of breath.  She was in the hospital 3 weeks prior due to an asthma exacerbation and bilateral pleural effusions treated with thoracentesis.  Recurrent pleural effusion felt secondary to breast cancer.  Right-sided pleural effusion noted on chest x-ray, Pleurx catheter placement attempted on 6/23 however ultrasound did not show an adequate pocket of fluid so Pleurx catheter placement was terminated.  She was also treated for acute community-acquired pneumonia, she had previously been on Levaquin as outpatient this was changed to Augmentin which she was discharged on.  Plan to follow-up pulmonary outpatient with chest x-ray, assess  plans for Pleurx catheter at that visit.  Patient presents today for hospital follow-up.    No Known Allergies  Immunization History  Administered Date(s) Administered   Fluad Quad(high Dose 65+) 07/24/2019    Influenza Split 07/06/2011, 10/25/2012, 06/07/2018   Influenza Whole 07/06/2008, 08/30/2009, 09/15/2010   Influenza, High Dose Seasonal PF 07/10/2017   Influenza,inj,Quad PF,6+ Mos 07/03/2013, 06/16/2015, 10/06/2016   Influenza-Unspecified 07/04/2019   Pneumococcal Conjugate-13 07/12/2015   Pneumococcal Polysaccharide-23 08/03/1999, 12/12/2011   Td 03/03/1999    Past Medical History:  Diagnosis Date   Abnormal breast finding 2018   per pt/ having  a lot drainage from left breast nipple   Anemia    Anxiety    Breast cancer ([place] 11/2016   left/   CKD (chronic kidney disease)    Depression    GERD (gastroesophageal reflux disease)    TUMS as needed   HTN (hypertension)    states BP has been high recently; has been on med. x 20 yr.   Hyperlipidemia    Hyperplastic colon polyp    HYPERTENSION, BENIGN SYSTEMIC 11/29/2006   Lisinopril hctz 10-12.5mg , amlodipine 10mg , coreg 25mg  BID, clonidine 0.3 mg BID.    Insulin dependent diabetes mellitus    Personal history of chemotherapy    2018/finished 6 weeks of chemo in Sep 2018   Personal history of radiation therapy    2018 left breast/finished radiation in Sept 2018 per pt.    Tobacco History: Social History   Tobacco Use  Smoking Status Never Smoker  Smokeless Tobacco Former Systems developer   Types: Snuff   Counseling given: Not Answered   Outpatient Medications Prior to Visit  Medication Sig Dispense Refill   albuterol (VENTOLIN HFA) 108 (90 Base) MCG/ACT inhaler Inhale 2 puffs into the lungs every 6 (six) hours as needed for wheezing or shortness of breath. 18 g 2   amLODipine (NORVASC) 10 MG tablet TAKE 1 TABLET BY MOUTH EVERY DAY (Patient taking differently: Take 10 mg by mouth daily. ) 90 tablet 1   amoxicillin-clavulanate (AUGMENTIN) 875-125 MG tablet Take 1 tablet by mouth every 12 (twelve) hours. 4 tablet 0   B-D INS SYRINGE 0.5CC/31GX5/16 31G X 5/16" 0.5 ML MISC USE AS DIRECTED 4 TIMES A DAY 300 each  1   busPIRone (BUSPAR) 10 MG tablet Take 1 tablet (10 mg total) by mouth 3 (three) times daily. 270 tablet 1   carvedilol (COREG) 25 MG tablet Take 1 tablet (25 mg total) by mouth 2 (two) times daily with a meal. 180 tablet 3   cloNIDine (CATAPRES) 0.3 MG tablet TAKE 1 TABLET (0.3 MG TOTAL) BY MOUTH 2 (TWO) TIMES DAILY. 180 tablet 1   famotidine (PEPCID) 20 MG tablet TAKE 1 TABLET BY MOUTH EVERY DAY (Patient taking differently: Take 20 mg by mouth daily. ) 90 tablet 1   fluticasone (FLONASE) 50 MCG/ACT nasal spray Place 1 spray into both nostrils daily. 16 g 2   fluticasone furoate-vilanterol (BREO ELLIPTA) 100-25 MCG/INH AEPB Inhale 1 puff into the lungs daily. 14 each 0   furosemide (LASIX) 20 MG tablet Take 20 mg by mouth daily.     HYDROcodone-acetaminophen (NORCO/VICODIN) 5-325 MG tablet Take 1 tablet by mouth every 8 (eight) hours as needed for moderate pain. 15 tablet 0   Insulin Glargine (LANTUS SOLOSTAR) 100 UNIT/ML Solostar Pen Inject 28 units into the skin in am (Patient taking differently: Inject 10-35 Units into the skin daily. If over 220 takes 35 units) 10 pen 3  Insulin Pen Needle 31G X 5 MM MISC Use pen needles for insulin injection daily 100 each 3   insulin regular (NOVOLIN R) 100 units/mL injection Inject 0.14-0.18 mLs (14-18 Units total) into the skin 3 (three) times daily before meals. Relion. Please provide insulin syringes needed (Patient taking differently: Inject 10-15 Units into the skin daily. ) 20 mL 11   Insulin Syringe-Needle U-100 (B-D INS SYRINGE 0.5CC/30GX1/2") 30G X 1/2" 0.5 ML MISC USE AS DIRECTED 4 TIMES A DAY 300 each 3   lisinopril-hydrochlorothiazide (ZESTORETIC) 20-12.5 MG tablet Take 1 tablet by mouth daily.     lovastatin (MEVACOR) 40 MG tablet TAKE 1 TABLET BY MOUTH EVERYDAY AT BEDTIME (Patient not taking: Reported on 03/22/2020) 90 tablet 3   montelukast (SINGULAIR) 10 MG tablet Take 1 tablet (10 mg total) by mouth at bedtime. 30 tablet 3    ondansetron (ZOFRAN) 8 MG tablet Take 1 tablet (8 mg total) by mouth 2 (two) times daily as needed (Nausea or vomiting). 30 tablet 1   polyethylene glycol powder (GLYCOLAX/MIRALAX) 17 GM/SCOOP powder Take 17 g by mouth daily. (Patient taking differently: Take 17 g by mouth as needed for mild constipation or moderate constipation. ) 1 g 0   traMADol (ULTRAM) 50 MG tablet TAKE 1 TABLET BY MOUTH EVERY 6 HOURS AS NEEDED FOR MODERATE/SEVERE PAIN (NECK AND SHOULDER PAIN). 30 tablet 0   XELODA 500 MG tablet Take 3 tablets (1,500 mg total) by mouth 2 (two) times daily after a meal. Take for 14 days, then hold for 7 days. Repeat every 21 days. 84 tablet 0   No facility-administered medications prior to visit.      Review of Systems  Review of Systems   Physical Exam  LMP  (LMP Unknown)  Physical Exam   Lab Results:  CBC    Component Value Date/Time   WBC 7.4 03/23/2020 0325   RBC 3.64 (L) 03/23/2020 0325   HGB 10.8 (L) 03/23/2020 0325   HGB 11.4 (L) 06/12/2017 1005   HCT 31.9 (L) 03/23/2020 0325   HCT 33.6 (L) 04/02/2019 1035   HCT 34.0 (L) 06/12/2017 1005   PLT 321 03/23/2020 0325   PLT 218 06/12/2017 1005   MCV 87.6 03/23/2020 0325   MCV 82.9 06/12/2017 1005   MCH 29.7 03/23/2020 0325   MCHC 33.9 03/23/2020 0325   RDW 17.0 (H) 03/23/2020 0325   RDW 15.4 (H) 06/12/2017 1005   LYMPHSABS 0.3 (L) 03/22/2020 1200   LYMPHSABS 0.7 (L) 06/12/2017 1005   MONOABS 0.2 03/22/2020 1200   MONOABS 0.2 06/12/2017 1005   EOSABS 0.0 03/22/2020 1200   EOSABS 0.1 06/12/2017 1005   BASOSABS 0.0 03/22/2020 1200   BASOSABS 0.0 06/12/2017 1005    BMET    Component Value Date/Time   NA 136 03/24/2020 0226   NA 141 06/12/2017 1005   K 4.4 03/24/2020 0226   K 4.0 06/12/2017 1005   CL 98 03/24/2020 0226   CO2 28 03/24/2020 0226   CO2 26 06/12/2017 1005   GLUCOSE 185 (H) 03/24/2020 0226   GLUCOSE 184 (H) 06/12/2017 1005   BUN 34 (H) 03/24/2020 0226   BUN 14.9 06/12/2017 1005    CREATININE 1.96 (H) 03/24/2020 0226   CREATININE 1.66 (H) 11/07/2019 1531   CREATININE 1.1 06/12/2017 1005   CALCIUM 8.5 (L) 03/24/2020 0226   CALCIUM 9.6 06/12/2017 1005   GFRNONAA 24 (L) 03/24/2020 0226   GFRAA 28 (L) 03/24/2020 0226    BNP    Component  Value Date/Time   BNP 120.3 (H) 03/22/2020 1200    ProBNP    Component Value Date/Time   PROBNP CANCELED 09/15/2019 1552   PROBNP 44.0 07/16/2018 1137    Imaging: CT ABDOMEN PELVIS WO CONTRAST  Result Date: 03/02/2020 CLINICAL DATA:  Abdominal distension. History of malignancy. Evaluate for possible bowel obstruction. EXAM: CT ABDOMEN AND PELVIS WITHOUT CONTRAST TECHNIQUE: Multidetector CT imaging of the abdomen and pelvis was performed following the standard protocol without IV contrast. COMPARISON:  Chest CT, same date and prior abdominal CT scan 01/17/2018 FINDINGS: Lower chest: There are bilateral pleural effusions and numerous small metastatic pulmonary nodules. The heart is normal in size. Small amount of pericardial fluid without overt effusion. Hepatobiliary: 3 cm segment 6 liver lesion again demonstrated. No other obvious hepatic lesions without contrast. The gallbladder appears normal. No intra or extrahepatic biliary dilatation. Pancreas: Advanced pancreatic atrophy but no mass or inflammation. Spleen: Normal size.  No focal lesions. Adrenals/Urinary Tract: The adrenal glands are unremarkable. Some residual contrast in the collecting system of both kidneys from the recent chest CT. Prior numerous bilateral renal cysts but no worrisome renal lesions or hydronephrosis. The bladder contains contrast. No bladder mass or asymmetric bladder wall thickening. A mild to moderate cystocele is noted. Stomach/Bowel: The stomach, duodenum, small bowel and colon are unremarkable. No acute inflammatory changes, mass lesions or obstructive findings. The terminal ileum and appendix are normal. Contrast gets all the way to the colon and there are  no dilated small bowel loops. Vascular/Lymphatic: Scattered atherosclerotic calcifications involving the aorta and iliac arteries but no aneurysm. No mesenteric or retroperitoneal mass or lymphadenopathy. Reproductive: Enlarged fibroid uterus with calcified fibroids. The ovaries are normal. Other: No abdominal wall hernia or subcutaneous lesions are identified. No free abdominal/pelvic fluid collections. Musculoskeletal: No significant bony findings. IMPRESSION: 1. No acute abdominal/pelvic findings or lymphadenopathy. No findings for small bowel obstruction. 2. 3 cm segment 6 liver lesion consistent with metastatic disease. 3. Enlarged fibroid uterus with calcified fibroids. 4. Bilateral pleural effusions and numerous small metastatic pulmonary nodules. Aortic Atherosclerosis (ICD10-I70.0). Electronically Signed   By: Marijo Sanes M.D.   On: 03/02/2020 10:58   DG Chest 1 View  Result Date: 03/18/2020 CLINICAL DATA:  79 year old female status post right thoracentesis. EXAM: CHEST  1 VIEW COMPARISON:  Chest x-ray 03/02/2020. FINDINGS: No pneumothorax. Small left pleural effusion. No definite right pleural effusion. Patchy multifocal interstitial and airspace disease noted throughout the mid to lower lungs bilaterally. No evidence of pulmonary edema. Heart size is normal. Upper mediastinal contours are within normal limits. Surgical clips in the left axillary region, likely from prior lymph node dissection. IMPRESSION: 1. No pneumothorax in this patient status post thoracentesis. 2. Small left pleural effusion. 3. Multilobar bilateral pneumonia throughout the mid to lower lungs bilaterally. Electronically Signed   By: Vinnie Langton M.D.   On: 03/18/2020 11:21   DG Chest 1 View  Result Date: 03/02/2020 CLINICAL DATA:  Status post thoracentesis. Additional history provided: Post right thoracentesis, patient still having some shortness of breath, no unusual pain. EXAM: CHEST  1 VIEW COMPARISON:  CT angiogram  chest 10/03/2019, chest radiograph 03/01/2020. FINDINGS: Unchanged cardiomediastinal silhouette. Interval decrease in size of a right pleural effusion consistent with provided history of interval thoracentesis. Unchanged trace left pleural effusion. Multiple pulmonary nodules were better appreciated on chest CT performed earlier the same day. No evidence of pneumothorax. Surgical clips within the left axilla. Known osseous metastatic disease better appreciated on same-day chest CT.  IMPRESSION: Interval decrease in conspicuity of a right pleural effusion consistent with interval thoracentesis. No evidence of pneumothorax. Additional findings without interval change as described. Electronically Signed   By: Kellie Simmering DO   On: 03/02/2020 14:12   DG Chest 2 View  Result Date: 03/01/2020 CLINICAL DATA:  Patient reports sob and cough with chest congestion for 3 months. Reports thoracentesis 2 days ago. EXAM: CHEST - 2 VIEW COMPARISON:  Chest radiograph 01/26/2020, 02/24/2020 FINDINGS: Stable cardiomediastinal contours. There is a small right pleural effusion with adjacent opacities possibly representing atelectasis. There is a small nonspecific opacity in the right mid lung which appears new from prior. The left lung is clear. No pneumothorax. No acute finding in the visualized skeleton. IMPRESSION: 1. Small right pleural effusion with adjacent opacities possibly representing atelectasis. 2. New small focal opacity in the right mid lung is nonspecific, possibly atelectasis though early infection not excluded. Electronically Signed   By: Audie Pinto M.D.   On: 03/01/2020 17:50   CT Angio Chest PE W and/or Wo Contrast  Result Date: 03/22/2020 CLINICAL DATA:  Shortness of breath, history of metastatic breast cancer and pleural effusions EXAM: CT ANGIOGRAPHY CHEST WITH CONTRAST TECHNIQUE: Multidetector CT imaging of the chest was performed using the standard protocol during bolus administration of intravenous  contrast. Multiplanar CT image reconstructions and MIPs were obtained to evaluate the vascular anatomy. CONTRAST:  63mL OMNIPAQUE IOHEXOL 350 MG/ML SOLN COMPARISON:  CT chest angiogram, 03/02/2020 FINDINGS: Cardiovascular: Satisfactory opacification of the pulmonary arteries to the segmental level. No evidence of pulmonary embolism. Normal heart size. Trace pericardial effusion. Mediastinum/Nodes: No enlarged mediastinal, hilar, or axillary lymph nodes. Thyroid gland, trachea, and esophagus demonstrate no significant findings. Lungs/Pleura: Moderate right, small left pleural effusions increased compared to prior CT dated 03/02/2020. Numerous bilateral pulmonary nodules. Upper Abdomen: No acute abnormality. Musculoskeletal: Soft tissue mass in the lateral left breast and axilla. No acute or significant osseous findings. Review of the MIP images confirms the above findings. IMPRESSION: 1. Negative examination for pulmonary embolism. 2. Moderate right, small left pleural effusions, increased compared to prior CT dated 03/02/2020. 3. Numerous bilateral pulmonary nodules, in keeping with metastatic breast malignancy. 4. Trace pericardial effusion, unchanged compared to prior examination. 5. Soft tissue mass in the lateral left breast and axilla, in keeping with known breast malignancy. Electronically Signed   By: Eddie Candle M.D.   On: 03/22/2020 15:09   CT Angio Chest PE W and/or Wo Contrast  Result Date: 03/02/2020 CLINICAL DATA:  Shortness of breath, history of recent thoracentesis EXAM: CT ANGIOGRAPHY CHEST WITH CONTRAST TECHNIQUE: Multidetector CT imaging of the chest was performed using the standard protocol during bolus administration of intravenous contrast. Multiplanar CT image reconstructions and MIPs were obtained to evaluate the vascular anatomy. CONTRAST:  46mL OMNIPAQUE IOHEXOL 350 MG/ML SOLN COMPARISON:  Chest x-ray from the previous day, CT from 10/31/2019, PET-CT from 01/07/2020 FINDINGS:  Cardiovascular: Thoracic aorta and its branches are well visualize without aneurysmal dilatation or dissection. No cardiac enlargement is seen. Mild pericardial effusion is noted. The pulmonary artery shows a normal branching pattern. No definitive filling defect to suggest pulmonary embolism is seen. Mediastinum/Nodes: Thoracic inlet shows lymphadenopathy in the left supraclavicular region as well as posterior to the clavicle on the left. Scattered mediastinal lymph nodes are noted in the pre-vascular region as well as AP window slightly increased when compared with the prior exam. Additionally there are hilar lymph nodes identified as well as subcarinal adenopathy increased from the  prior study. Subcarinal lymph node measures at least 2.1 cm in short axis and previously measured approximately 1 cm in short axis on prior PET-CT. Right hilar lymph node measures 2.1 cm in short axis also increased from the prior study. Significant bulky adenopathy is noted in the left axilla as well as a focal soft tissue mass lesion which measures 5.1 by 2.5 cm best seen on image number 136 of series 7. This is consistent with recurrent disease and is similar to that seen on recent PET-CT. Bulky lymph nodes are noted associated with this mass which appear increased when compared with the prior exam consistent with progression of disease. Multiple subpectoral lymph nodes are noted on the left also consistent with progressive disease. Lungs/Pleura: Small left pleural effusion is noted consistent with the recent thoracentesis. Moderate right-sided pleural effusion is noted. The lungs demonstrate multiple parenchymal nodules which have increased in size and number when compared with the prior exam. Largest of these on the left is posterior along the pleural margin measuring 1.9 cm. The largest of these on the right is seen in the upper lobe measuring approximately 11 mm. Right lower lobe consolidation is noted of a mild degree. Upper  Abdomen: Visualized upper abdomen demonstrates multiple renal cysts. Additionally there is a rounded hypodensity in the posterior aspect of the right lobe of the liver best seen on image number 354 of series 7 consistent with metastatic disease. This is stable from the prior PET-CT. Musculoskeletal: Degenerative changes of the thoracic spine are noted. Medial scapular metastatic lesion is noted on the left similar to that seen on prior PET-CT. Pathologic fracture is again noted involving the right eighth rib posteriorly. Some mottled areas of bony density are noted within the ribcage consistent with metastatic disease. Review of the MIP images confirms the above findings. IMPRESSION: No evidence of pulmonary emboli. Progression of disease particularly within the lungs, hila and mediastinum consistent with the known history of breast carcinoma with recurrence. Persistent left breast mass is noted with increasing left axillary and left chest wall lymphadenopathy. Bilateral pleural effusions right greater than left. Bony metastatic disease. Electronically Signed   By: Inez Catalina M.D.   On: 03/02/2020 01:41   DG Chest Portable 1 View  Result Date: 03/22/2020 CLINICAL DATA:  Shortness of breath.  Metastatic breast carcinoma. EXAM: PORTABLE CHEST 1 VIEW COMPARISON:  03/18/2020 FINDINGS: Moderate cardiomegaly appears mildly increased since previous study. Increased diffuse interstitial infiltrates may be due to interstitial edema. New confluent opacity is seen in the right midlung, suspicious for pneumonia. Left lower lobe atelectasis or infiltrate shows no significant change. Probable small bilateral pleural effusions noted. Small pulmonary nodules are noted, but less well visualized than on prior exam. Multiple surgical clips again seen in the left axilla. IMPRESSION: 1. New confluent opacity in right midlung, suspicious for pneumonia. 2. Mild increase in cardiomegaly and diffuse interstitial infiltrates,  suspicious for congestive heart failure. 3. Stable left lower lobe atelectasis versus infiltrate. Probable small bilateral pleural effusions. 4. Pulmonary metastases are less well visualized than on previous study. Electronically Signed   By: Marlaine Hind M.D.   On: 03/22/2020 12:03   VAS [name] LOWER EXTREMITY VENOUS (DVT)  Result Date: 03/02/2020  Lower Venous DVTStudy Indications: Pain, and Edema.  Limitations: Pain, poor ultrasound/tissue interface and body habitus. Performing Technologist: Antonieta Pert RDMS, RVT  Examination Guidelines: A complete evaluation includes B-mode imaging, spectral Doppler, color Doppler, and power Doppler as needed of all accessible portions of each vessel. Bilateral  testing is considered an integral part of a complete examination. Limited examinations for reoccurring indications may be performed as noted. The reflux portion of the exam is performed with the patient in reverse Trendelenburg.  +---------+---------------+---------+-----------+----------+-------------------+  RIGHT     Compressibility Phasicity Spontaneity Properties Thrombus Aging       +---------+---------------+---------+-----------+----------+-------------------+  CFV       Full            Yes       Yes                                         +---------+---------------+---------+-----------+----------+-------------------+  SFJ       Full                                                                  +---------+---------------+---------+-----------+----------+-------------------+  FV Prox   Full                                                                  +---------+---------------+---------+-----------+----------+-------------------+  FV Mid    Full                                                                  +---------+---------------+---------+-----------+----------+-------------------+  FV Distal                 Yes       Yes                    could not tolerate                                                                compression          +---------+---------------+---------+-----------+----------+-------------------+  PFV       Full                                                                  +---------+---------------+---------+-----------+----------+-------------------+  POP                                                        Not  visualized       +---------+---------------+---------+-----------+----------+-------------------+  PTV                       Yes                              could not tolerate                                                               compression          +---------+---------------+---------+-----------+----------+-------------------+  PERO                      Yes                              could not tolerate                                                               compression          +---------+---------------+---------+-----------+----------+-------------------+  GSV       Full                                                                  +---------+---------------+---------+-----------+----------+-------------------+   +---------+---------------+---------+-----------+----------+-------------------+  LEFT      Compressibility Phasicity Spontaneity Properties Thrombus Aging       +---------+---------------+---------+-----------+----------+-------------------+  CFV       Full            Yes       Yes                                         +---------+---------------+---------+-----------+----------+-------------------+  SFJ       Full                                                                  +---------+---------------+---------+-----------+----------+-------------------+  FV Prox                   Yes                              could not tolerate  compression          +---------+---------------+---------+-----------+----------+-------------------+  FV Mid                    Yes                               could not tolerate                                                               compression          +---------+---------------+---------+-----------+----------+-------------------+  FV Distal                 Yes       Yes                    could not tolerate                                                               compression          +---------+---------------+---------+-----------+----------+-------------------+  POP                       Yes       Yes                    could not tolerate                                                               compression          +---------+---------------+---------+-----------+----------+-------------------+  PTV                       Yes                              could not tolerate                                                               compression          +---------+---------------+---------+-----------+----------+-------------------+  PERO                      Yes                              could not tolerate  compression          +---------+---------------+---------+-----------+----------+-------------------+  GSV       Full                                                                  +---------+---------------+---------+-----------+----------+-------------------+     Summary: RIGHT: - There is no evidence of deep vein thrombosis in the lower extremity. However, portions of this examination were limited- see technologist comments above.  - No cystic structure found in the popliteal fossa.  LEFT: - There is no evidence of deep vein thrombosis in the lower extremity. However, portions of this examination were limited- see technologist comments above.  - No cystic structure found in the popliteal fossa. - Patient could not tolerate compression and almost did not tolerate any touch. best images possible.  *See table(s) above for measurements and observations.  Electronically signed by Deitra Mayo MD on 03/02/2020 at 6:04:43 PM.    Final    ECHOCARDIOGRAM LIMITED  Result Date: 03/23/2020    ECHOCARDIOGRAM REPORT   Patient Name:   Wendy [name] Date of Exam: 03/23/2020 Medical Rec #:  [id number]    Height:       62.0 in Accession #:    [id number]   Weight:       200.4 lb Date of Birth:  02/19/1941    BSA:          1.913 m Patient Age:    82 years     BP:           163/101 mmHg Patient Gender: F            HR:           100 bpm. Exam Location:  Inpatient Procedure: Limited Echo, Cardiac Doppler and Color Doppler Indications:     Pericardial effusion 423.9 / I31.3  History:         Patient has prior history of Echocardiogram examinations, most                  recent 02/05/2020. Risk Factors:Diabetes, Hypertension and                  Dyslipidemia. Multiple metastatic breast cancer, ,metastatic                  pleural effusion, GERD, Chronic kidney disease.  Sonographer:     Darlina Sicilian RDCS Referring Phys:  [id number] Lequita Halt Diagnosing Phys: Fransico Him MD IMPRESSIONS  1. Left ventricular ejection fraction, by estimation, is 55 to 60%. The left ventricle has normal function. Left ventricular endocardial border not optimally defined to evaluate regional wall motion. Left ventricular diastolic function could not be evaluated.  2. Right ventricular systolic function was not well visualized. The right ventricular size is not well visualized. There is moderately elevated pulmonary artery systolic pressure. The estimated right ventricular systolic pressure is 01.7 mmHg.  3. The mitral valve is normal in structure. No evidence of mitral valve regurgitation. No evidence of mitral stenosis.  4. The aortic valve is normal in structure. Aortic valve regurgitation is not visualized. No aortic stenosis is present.  5. The inferior vena cava is dilated in size with >50% respiratory variability, suggesting right atrial pressure of 8 mmHg.  6. A small pericardial effusion is  present. The pericardial effusion is circumferential. There is no evidence of cardiac tamponade. FINDINGS  Left Ventricle: Left ventricular ejection fraction, by estimation, is 55 to 60%. The left ventricle has normal function. Left ventricular endocardial border not optimally defined to evaluate regional wall motion. The left ventricular internal cavity size was normal in size. There is no left ventricular hypertrophy. Left ventricular diastolic function could not be evaluated. Right Ventricle: The right ventricular size is not well visualized. Right vetricular wall thickness was not assessed. Right ventricular systolic function was not well visualized. There is moderately elevated pulmonary artery systolic pressure. The tricuspid regurgitant velocity is 2.85 m/s, and with an assumed right atrial pressure of 8 mmHg, the estimated right ventricular systolic pressure is 15.1 mmHg. Left Atrium: Left atrial size was normal in size. Right Atrium: Right atrial size was normal in size. Pericardium: A small pericardial effusion is present. The pericardial effusion is circumferential. There is no evidence of cardiac tamponade. Mitral Valve: The mitral valve is normal in structure. There is mild calcification of the anterior mitral valve leaflet(s). Normal mobility of the mitral valve leaflets. Mild mitral annular calcification. No evidence of mitral valve regurgitation. No evidence of mitral valve stenosis. Tricuspid Valve: The tricuspid valve is normal in structure. Tricuspid valve regurgitation is mild . No evidence of tricuspid stenosis. Aortic Valve: The aortic valve is normal in structure. Aortic valve regurgitation is not visualized. No aortic stenosis is present. Pulmonic Valve: The pulmonic valve was normal in structure. Pulmonic valve regurgitation is not visualized. No evidence of pulmonic stenosis. Aorta: The aortic root is normal in size and structure. Venous: The inferior vena cava is dilated in size with  greater than 50% respiratory variability, suggesting right atrial pressure of 8 mmHg. IAS/Shunts: No atrial level shunt detected by color flow Doppler.  LEFT VENTRICLE PLAX 2D LVIDd:         4.00 cm LVIDs:         2.90 cm LV PW:         1.00 cm LV IVS:        1.10 cm LVOT diam:     2.00 cm LVOT Area:     3.14 cm  LV Volumes (MOD) LV vol d, MOD A2C: 41.1 ml LV vol d, MOD A4C: 66.2 ml LV vol s, MOD A2C: 24.2 ml LV vol s, MOD A4C: 27.8 ml LV SV MOD A2C:     16.9 ml LV SV MOD A4C:     66.2 ml LV SV MOD BP:      28.0 ml LEFT ATRIUM         Index LA diam:    3.70 cm 1.93 cm/m   AORTA Ao Root diam: 3.20 cm TRICUSPID VALVE TR Peak grad:   32.5 mmHg TR Vmax:        285.00 cm/s  SHUNTS Systemic Diam: 2.00 cm Fransico Him MD Electronically signed by Fransico Him MD Signature Date/Time: 03/23/2020/3:43:48 PM    Final (Updated)    IR THORACENTESIS ASP PLEURAL SPACE W/IMG GUIDE  Result Date: 03/02/2020 INDICATION: SHORTNESS OF BREATH, RIGHT PLEURAL EFFUSION EXAM: ULTRASOUND GUIDED RIGHT THORACENTESIS MEDICATIONS: 1% lidocaine local COMPLICATIONS: None immediate. PROCEDURE: An ultrasound guided thoracentesis was thoroughly discussed with the patient and questions answered. The benefits, risks, alternatives and complications were also discussed. The patient understands and wishes to proceed with the procedure. Written consent was obtained. Ultrasound was performed to localize and mark an adequate pocket of fluid  in the right chest. The area was then prepped and draped in the normal sterile fashion. 1% Lidocaine was used for local anesthesia. Under ultrasound guidance a 6 Fr Safe-T-Centesis catheter was introduced. Thoracentesis was performed. The catheter was removed and a dressing applied. FINDINGS: A total of approximately 300 cc of blood tinged pleural fluid was removed. Samples were sent to the laboratory as requested by the clinical team. IMPRESSION: Successful ultrasound guided right thoracentesis yielding 300 cc of  pleural fluid. Electronically Signed   By: Jerilynn Mages.  Shick M.D.   On: 03/02/2020 15:02   US Thoracentesis Asp Pleural space w/IMG guide  Result Date: 03/18/2020 INDICATION: Patient with history of metastatic breast cancer, dyspnea, recurrent right pleural effusion. Request made for therapeutic right thoracentesis. EXAM: ULTRASOUND GUIDED THERAPEUTIC RIGHT THORACENTESIS MEDICATIONS: None COMPLICATIONS: None immediate. PROCEDURE: An ultrasound guided thoracentesis was thoroughly discussed with the patient and questions answered. The benefits, risks, alternatives and complications were also discussed. The patient understands and wishes to proceed with the procedure. Written consent was obtained. Ultrasound was performed to localize and mark an adequate pocket of fluid in the right chest. The area was then prepped and draped in the normal sterile fashion. 1% Lidocaine was used for local anesthesia. Under ultrasound guidance a 6 Fr Safe-T-Centesis catheter was introduced. Thoracentesis was performed. The catheter was removed and a dressing applied. FINDINGS: A total of approximately 400 cc of slightly hazy, yellow fluid was removed. IMPRESSION: Successful ultrasound guided therapeutic right thoracentesis yielding 400 cc of pleural fluid. Read by: Rowe Robert, PA-C Electronically Signed   By: Corrie Mckusick D.O.   On: 03/18/2020 10:53     Assessment & Plan:   No problem-specific Assessment & Plan notes found for this encounter.     Martyn Ehrich, NP 03/30/2020

## 2020-03-30 NOTE — Telephone Encounter (Signed)
[facility] from [facility] care called stating that Wendy [name] need an order for a hospital bed and needs to know how to go about getting an order for that.

## 2020-03-30 NOTE — Telephone Encounter (Signed)
Ok to put in order

## 2020-03-30 NOTE — Telephone Encounter (Signed)
Yes thanks-may place an order.  I feel like I already signed off on something for this though and faxed it

## 2020-04-01 ENCOUNTER — Other Ambulatory Visit: Payer: Medicare Other

## 2020-04-01 ENCOUNTER — Other Ambulatory Visit: Payer: Self-pay

## 2020-04-01 DIAGNOSIS — C7951 Secondary malignant neoplasm of bone: Secondary | ICD-10-CM | POA: Diagnosis not present

## 2020-04-01 DIAGNOSIS — I129 Hypertensive chronic kidney disease with stage 1 through stage 4 chronic kidney disease, or unspecified chronic kidney disease: Secondary | ICD-10-CM | POA: Diagnosis not present

## 2020-04-01 DIAGNOSIS — C787 Secondary malignant neoplasm of liver and intrahepatic bile duct: Secondary | ICD-10-CM | POA: Diagnosis not present

## 2020-04-01 DIAGNOSIS — C7981 Secondary malignant neoplasm of breast: Secondary | ICD-10-CM | POA: Diagnosis not present

## 2020-04-01 DIAGNOSIS — Z515 Encounter for palliative care: Secondary | ICD-10-CM

## 2020-04-01 DIAGNOSIS — E1122 Type 2 diabetes mellitus with diabetic chronic kidney disease: Secondary | ICD-10-CM | POA: Diagnosis not present

## 2020-04-01 DIAGNOSIS — J45901 Unspecified asthma with (acute) exacerbation: Secondary | ICD-10-CM | POA: Diagnosis not present

## 2020-04-01 NOTE — Telephone Encounter (Signed)
[facility] has called back wanting to know if order had been sent?  Would like call back at [phone]. You can leave VM.  Please include where order has been sent.

## 2020-04-01 NOTE — Progress Notes (Signed)
[facility] SW NOTE  PATIENT NAME: Wendy [name] DOB: 02/10/1941 MRN: [id number]  PRIMARY CARE PROVIDER: Marin Olp, MD  RESPONSIBLE PARTY:  Acct ID - Guarantor Home Phone Work Phone Relationship Acct Type  [id number] Wendy, [name] [phone]  Self P/F     [address], [place], [name] [address]     PLAN OF CARE and INTERVENTIONS:             1. GOALS OF CARE/ ADVANCE CARE PLANNING: Patient is a DNR. Most form completed. HCPOA forms completed and notarized this visit.  SOCIAL/EMOTIONAL/SPIRITUAL ASSESSMENT/ INTERVENTIONS:  SW met with patient, patients son Wendy [name] and DIL Wendy [name] in the home. Patient had recent hospitalization on 03-22-2020 due to Acute respiratory failure. During visit patients breathing had improved since last visit and patient states that she feels much better. Family shared that patient was supposed to have pleurx cath placed to assist with draining fluid, however patient does not have any fluid to drain at this time. [facility] staff present during visit and checked vitals and reviewed meds. Patient to finish first trial of oral chemo radiation this week and will begin second trial in two weeks.Patient states she is eating good and does not have any trouble sleeping mostly. Patient continues of 4LP oxygen. No pain voiced. Family has completed Medicaid application and has received feedback on furthering the process. SW outreached PCP Dr. Ronney [name] office to follow up on hospital order being sent to DME provider, SW awaiting f/u call. Patient and family did not have any further needs/concerns at this time. Palliative care services to continue.  2.  3. PATIENT/CAREGIVER EDUCATION/ COPING:  Patient alert and engaged in visit. Patient was able to confirm and verify her son Wendy [name] being her HCPOA. Patient open about her needs. Family is supportive. 4. PERSONAL EMERGENCY PLAN:  Patient to call 9-1-1 for emergencies. 5. COMMUNITY RESOURCES COORDINATION/ HEALTH  CARE NAVIGATION:  Patient receiving PT/OT/Nurisng services from [facility] weekly. Patient has applied for Medicaid and is submitting additional paperwork/medical bills to complete the process. FINANCIAL/LEGAL CONCERNS/INTERVENTIONS:  None.    SOCIAL HX:  Social History   Tobacco Use  . Smoking status: Never Smoker  . Smokeless tobacco: Former Systems developer    Types: Snuff  Substance Use Topics  . Alcohol use: No    Alcohol/week: 0.0 standard drinks    CODE STATUS: DNR ADVANCED DIRECTIVES: Y MOST FORM COMPLETE:  Y HOSPICE EDUCATION PROVIDED: N  [id number] is able to ambulate with RW. Patient to complete ADL's with min assistance.   Time Spent: visit was 30 mins.     Wendy [name], [place]

## 2020-04-02 DIAGNOSIS — C7981 Secondary malignant neoplasm of breast: Secondary | ICD-10-CM | POA: Diagnosis not present

## 2020-04-02 DIAGNOSIS — C787 Secondary malignant neoplasm of liver and intrahepatic bile duct: Secondary | ICD-10-CM | POA: Diagnosis not present

## 2020-04-02 DIAGNOSIS — E1122 Type 2 diabetes mellitus with diabetic chronic kidney disease: Secondary | ICD-10-CM | POA: Diagnosis not present

## 2020-04-02 DIAGNOSIS — C7951 Secondary malignant neoplasm of bone: Secondary | ICD-10-CM | POA: Diagnosis not present

## 2020-04-02 DIAGNOSIS — I129 Hypertensive chronic kidney disease with stage 1 through stage 4 chronic kidney disease, or unspecified chronic kidney disease: Secondary | ICD-10-CM | POA: Diagnosis not present

## 2020-04-02 DIAGNOSIS — J45901 Unspecified asthma with (acute) exacerbation: Secondary | ICD-10-CM | POA: Diagnosis not present

## 2020-04-03 ENCOUNTER — Other Ambulatory Visit: Payer: Self-pay | Admitting: Family Medicine

## 2020-04-05 ENCOUNTER — Other Ambulatory Visit: Payer: Self-pay | Admitting: Hematology

## 2020-04-05 DIAGNOSIS — C50919 Malignant neoplasm of unspecified site of unspecified female breast: Secondary | ICD-10-CM

## 2020-04-05 DIAGNOSIS — J45901 Unspecified asthma with (acute) exacerbation: Secondary | ICD-10-CM | POA: Diagnosis not present

## 2020-04-05 DIAGNOSIS — E1122 Type 2 diabetes mellitus with diabetic chronic kidney disease: Secondary | ICD-10-CM | POA: Diagnosis not present

## 2020-04-05 DIAGNOSIS — C787 Secondary malignant neoplasm of liver and intrahepatic bile duct: Secondary | ICD-10-CM | POA: Diagnosis not present

## 2020-04-05 DIAGNOSIS — I129 Hypertensive chronic kidney disease with stage 1 through stage 4 chronic kidney disease, or unspecified chronic kidney disease: Secondary | ICD-10-CM | POA: Diagnosis not present

## 2020-04-05 DIAGNOSIS — C7951 Secondary malignant neoplasm of bone: Secondary | ICD-10-CM | POA: Diagnosis not present

## 2020-04-05 DIAGNOSIS — C7981 Secondary malignant neoplasm of breast: Secondary | ICD-10-CM | POA: Diagnosis not present

## 2020-04-06 ENCOUNTER — Telehealth: Payer: Self-pay | Admitting: Family Medicine

## 2020-04-06 NOTE — Telephone Encounter (Signed)
[place] from [place] called requesting to speak about orders that were placed for a hospital bed and Rolator. Please call at earliest convenience.

## 2020-04-06 NOTE — Progress Notes (Signed)
[place]   [phone] [phone]   Clinic Follow up Note   Patient Care Team: Marin Olp, MD as PCP - General (Family Medicine) Philemon Kingdom, MD as Consulting Physician (Internal Medicine) Jalene Mullet, MD as Consulting Physician (Ophthalmology) Erroll Luna, MD as Consulting Physician (General Surgery) Kyung Rudd, MD as Consulting Physician (Radiation Oncology) [name], MD as Consulting Physician (Hematology) Gardenia Phlegm, NP as Nurse Practitioner (Hematology and Oncology) Marshell Garfinkel, MD as Consulting Physician (Pulmonary Disease) 04/07/2020  CHIEF COMPLAINT: F/u metastatic left breast cancer   SUMMARY OF ONCOLOGIC HISTORY: Oncology History Overview Note  Cancer Staging Malignant neoplasm of upper-outer quadrant of left breast in female, estrogen receptor positive ([place]) Staging form: Breast, AJCC 8th Edition - Clinical: Stage IIB (cT2, cN0, cM0, G3, ER: Positive, PR: Negative, HER2: Negative) - Signed by [name], MD on 11/23/2016 - Pathologic stage from 12/28/2016: Stage IIB (pT2, pN1a(sn), cM0, G3, ER: Positive, PR: Negative, HER2: Negative) - Signed by [name], MD on 01/02/2017     Malignant neoplasm of upper-outer quadrant of left breast in female, estrogen receptor positive ([place])  11/01/2016 Mammogram   Diagnostic mammogram and ultrasound of left breast and axilla showed a 3.1 x 2.1 x 1.4 cm (3.3 x 2.0 x 2.7 cm by ultrasound) mass in the upper outer quadrant of the anterior third of the left breast, associated with pleomorphic calcification. There is a 8 mm (1.8cm by [name]) prominent lymph node in the left axilla.   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   11/07/2016 Receptors her2   ER 80% positive, PR negative, HER-2 negative, Ki-67 90%   11/20/2016 Initial Diagnosis   Malignant neoplasm of  upper-outer quadrant of left breast in female, estrogen receptor positive ([place])   12/07/2016 Surgery   Left lumpectomy and left axillary sentinel lymph node sampling by Dr. Brantley Stage   12/07/2016 Pathology Results   pT2, pN1 Left Lumpectomy: Grade 3 IDC measuring 3.4 cm, carcinoma broadly present at the superior margin. Grade 3 DCIS. 1 out of 2 left axillary SLN positive for metastatic carcinoma   12/07/2016 Miscellaneous   Mammaprint showed high risk disease, basal type    12/28/2016 Surgery   Re-excision of the previously positive superior margin was negative for malignant cells.    01/04/2017 Surgery   Port inserted   01/18/2017 - 03/22/2017 Chemotherapy   Adjuvant Docetaxel 75 mg/m and Cytoxan 600 mg/m, every 21 days, for total of 4 cycles, with Neulasta on day 2.    04/23/2017 - 05/18/2017 Radiation Therapy   Radiation treatment dates:   04/23/17 - 05/18/17 Administered by Dr. Lisbeth Renshaw  Site/dose:    Left breast/ 42.5 Gy in 17 Fx Boost / 7.5 Gy in 3 Fx   06/12/2017 - 08/14/2019 Anti-estrogen oral therapy   Adjuvant letrozole 1 mg daily, plan for 5-7 years. D/c on 08/14/19 due to local recurrence of left axilla.     07/20/2017 Mammogram   IMPRESSION: No mammographic evidence of malignancy in either breast. 3.8 cm left breast postsurgical loculated seroma.   09/12/2017 Survivorship     09/19/2018 Imaging   Baseline DEXA 09/19/18 ASSESSMENT: The BMD measured at Femur Neck Left is 0.888 g/cm2 with a T-score of -1.1. This patient is considered osteopenic according to [facility]) criteria.   The scan quality is good. L-4 was excluded due to degenerative changes.  Site Region Measured Date Measured Age YA BMD Significant CHANGE T-score AP Spine  L1-L3      09/19/2018    [age]         -0.7    1.084 g/cm2   DualFemur Neck Left  09/19/2018    [age]         -1.1    0.888 g/cm2   DualFemur Total Mean 09/19/2018    77.2         -0.5    0.946 g/cm2 ASSESSMENT: The  probability of a major osteoporotic fracture is 4.5 % within the next ten years.   The probability of hip fracture is 0.8  % within the next 10 years.   06/25/2019 Mammogram   Diagnostic mammogram 06/25/19  IMPRESSION: 1.  Two morphologically abnormal lymph nodes in the left axilla with a lymph node in the lower left axilla measuring 2.4 x 2 x 2.6 cm.   2. Stable lumpectomy changes left breast with no findings of malignancy in either breast.   07/01/2019 Pathology Results   Diagnosis Lymph node, needle/core biopsy, left axilla - POORLY DIFFERENTIATED CARCINOMA CONSISTENT WITH BREAST PRIMARY. - NO LYMPHOID NODAL TISSUE IDENTIFIED. - SEE MICROSCOPIC DESCRIPTION. Microscopic Comment The carcinoma is consistent with grade III.  PROGNOSTIC INDICATORS Results: IMMUNOHISTOCHEMICAL AND MORPHOMETRIC ANALYSIS PERFORMED MANUALLY The tumor cells are NEGATIVE for Her2 (1+). Estrogen Receptor: 80%, POSITIVE, WEAK STAINING INTENSITY Progesterone Receptor: 0%, NEGATIVE Proliferation Marker Ki67: 80%   07/18/2019 Breast MRI   Breast MRI 07/18/19  IMPRESSION: 1. Two morphologically abnormal level 1 left axillary lymph nodes correlating with biopsy-proven malignancy. 2. No MRI evidence of malignancy in either breast. 3. Postsurgical changes of the left breast consistent with prior lumpectomy.   07/25/2019 PET scan   PET 07/25/19  IMPRESSION: 1. Hypermetabolic LEFT axillary mass consistent with metastatic breast cancer. 2. High LEFT axillary/posterior triangle (level 5) lymph node consistent with local metastasis. 3. No central mediastinal nodal metastasis. No suspicious pulmonary nodules. 4. No distant soft tissue metastasis or skeletal metastasis. 5. Calcified fibroid uterus   08/05/2019 Surgery   LEFT AXILLARY LYMPH NODE DISSECTION by Dr. Brantley Stage 08/05/19    08/05/2019 Pathology Results   FINAL MICROSCOPIC DIAGNOSIS: 08/05/19    A. LYMPH NODES, LEFT AXILLARY, DISSECTION:  -  Metastatic carcinoma.  - See comment.    08/2019 - 01/13/2020 Anti-estrogen oral therapy   Exemestane 73m starting 08/2019. Stopped with the start of chemo in 01/2020   09/01/2019 Genetic Testing   [employer] Genomic Findings:  -RET amplification -CDK8 amplification -NOTCH1 deletion  -TNK53splice site [id number]>H  14/19/3790Imaging   CT Angio Chest   IMPRESSION: 1. No definitive pulmonary embolism. 2. Multiple new small bilateral pulmonary nodules consistent with metastatic disease. 3. Soft tissue density in the left axilla at the site of node dissection, probably representing scarring but I cannot exclude tumor recurrence. 4. New partially healed fracture of the posterior aspect of the right eighth rib.   12/04/2019 - 01/13/2020 Chemotherapy   Verzenio 15110mBID starting 12/04/19. Stopped a week before the start of chemo in 01/2020   01/07/2020 PET scan   IMPRESSION: 1. Marked worsening of disease with soft tissue recurrence in the left axilla, new and enlarging lymph nodes in the supraclavicular/high axillary region, new mediastinal adenopathy and new pulmonary pleural and parenchymal disease. 2. Metastatic disease to the right hepatic lobe. 3. Signs of bony metastasis. 4. New pleural effusions presumably malignant and associated with pleural nodularity.   01/20/2020 -  02/23/2020 Chemotherapy   Abraxane on days 1, 8, 15 q28 days; started 01/20/20. Stopped after 02/23/20 due to disease progression.    02/24/2020 Procedure   Left thoracentesis  IMPRESSION: Successful ultrasound guided left thoracentesis yielding 320 ml of pleural fluid.    FINAL MICROSCOPIC DIAGNOSIS:  - Malignant cells consistent with metastatic adenocarcinoma     03/02/2020 Imaging   CT Angio Chest  IMPRESSION: No evidence of pulmonary emboli.   Progression of disease particularly within the lungs, hila and mediastinum consistent with the known history of breast carcinoma with recurrence. Persistent  left breast mass is noted with increasing left axillary and left chest wall lymphadenopathy.   Bilateral pleural effusions right greater than left.   Bony metastatic disease.   03/02/2020 Imaging   CT AP  IMPRESSION: 1. No acute abdominal/pelvic findings or lymphadenopathy. No findings for small bowel obstruction. 2. 3 cm segment 6 liver lesion consistent with metastatic disease. 3. Enlarged fibroid uterus with calcified fibroids. 4. Bilateral pleural effusions and numerous small metastatic pulmonary nodules.   Aortic Atherosclerosis (ICD10-I70.0).   03/02/2020 Procedure   Right Thoracentesis  IMPRESSION: Successful ultrasound guided right thoracentesis yielding 300 cc of pleural fluid.    FINAL MICROSCOPIC DIAGNOSIS:  - Malignant cells consistent with adenocarcinoma  - See comment  The tumor cells are EQUIVOCAL for Her2 (2+);  FISH is pending and will  be reported in an addendum   Estrogen Receptor: NEGATIVE  Progesterone Receptor: NEGATIVE  Proliferation Marker Ki-67: 60%    04/18/2020 -  Chemotherapy   Xeloda 1529m BID 2 weeks on/1 week off      CURRENT THERAPY: PENDING Xeloda 15059mBID 2 weeks on/1 week off  INTERVAL HISTORY: Ms. JoSantinieturns for f/u as scheduled. She was last seen in clinic on 03/18/20 when post-thoracentesis chest xray showed multi lobar pneumonia. She was given levaquin. She developed worsening dyspnea with increased O2 requirement at home, EMS found her to be hypoxic to 84% at home. Work up in the ED showed worsening pneumonia despite antibiotics. CTA negative for PE but showed pleural effusion. However, volume was too low for pleurX placement. Seen by palliative med Dr. GoHilma FavorsShe was discharged home on 03/25/20 on Augmentin.   Today she presents with her son. She feels good today. Completed day 14 of Xeloda on 7/1. She felt well on treatment for the most part except intermittent nausea which increased this week. Zofran is not entirely effective;  no vomiting. Appetite fluctuates. Denies mucositis. Denies diarrhea. Last week she had sore lower abdomen during position changes that improved this week. Denies fall or injury. She is getting up, out of bed and able to do some things independently. PT comes 1-2 times per week. Waiting on hospital bed and rollator per PCP. Her breathing is "good." Denies worsening cough, chest pain, or dyspnea lately. Using O2 at 4 liters which is her baseline requirement. She woke up this morning nasal cannula had come off but she didn't notice. Denies new pain. Takes tramadol 1-2 times daily. Denies hand/foot redness or pain. Left arm less swollen.   MEDICAL HISTORY:  Past Medical History:  Diagnosis Date   Abnormal breast finding 2018   per pt/ having  a lot drainage from left breast nipple   Anemia    Anxiety    [place]   left/   CKD (chronic kidney disease)    Depression    GERD (gastroesophageal reflux disease)    TUMS as needed   HTN (  hypertension)    states BP has been high recently; has been on med. x 20 yr.   Hyperlipidemia    Hyperplastic colon polyp    HYPERTENSION, BENIGN SYSTEMIC 11/29/2006   Lisinopril hctz 10-12.60m, amlodipine 165m coreg 2535mID, clonidine 0.3 mg BID.    Insulin dependent diabetes mellitus    Personal history of chemotherapy    2018/finished 6 weeks of chemo in Sep 2018   Personal history of radiation therapy    2018 left breast/finished radiation in Sept 2018 per pt.    SURGICAL HISTORY: Past Surgical History:  Procedure Laterality Date   AXILLARY LYMPH NODE DISSECTION Left 08/05/2019   Procedure: LEFT AXILLARY LYMPH NODE DISSECTION;  Surgeon: CorErroll LunaD;  [facility];  Laterality: Left;   BREAST LUMPECTOMY Left 05/05/2008   BREAST LUMPECTOMY Left 12/07/2016   malignant   BREAST LUMPECTOMY WITH RADIOACTIVE SEED AND SENTINEL LYMPH NODE BIOPSY Left 12/07/2016   Procedure: LEFT  BREAST LUMPECTOMY WITH RADIOACTIVE SEED AND SENTINEL LYMPH NODE BIOPSY;  Surgeon: ThoErroll LunaD;  [facility];  Laterality: Left;   IR FLUORO GUIDE PORT INSERTION RIGHT  01/04/2017   IR THORACENTESIS ASP PLEURAL SPACE W/IMG GUIDE  03/02/2020   IR US [place]IDE VASC ACCESS RIGHT  01/04/2017   PORT-A-CATH REMOVAL N/A 05/30/2017   Procedure: REMOVAL PORT-A-CATH;  Surgeon: CorErroll LunaD;  [facility];  Laterality: N/A;   RE-EXCISION OF BREAST LUMPECTOMY Left 12/28/2016   Procedure: RE-EXCISION OF BREAST LUMPECTOMY;  Surgeon: ThoErroll LunaD;  [facility];  Laterality: Left;    I have reviewed the social history and family history with the patient and they are unchanged from previous note.  ALLERGIES:  has No Known Allergies.  MEDICATIONS:  Current Outpatient Medications  Medication Sig Dispense Refill   albuterol (VENTOLIN HFA) 108 (90 Base) MCG/ACT inhaler Inhale 2 puffs into the lungs every 6 (six) hours as needed for wheezing or shortness of breath. 18 g 2   amLODipine (NORVASC) 10 MG tablet TAKE 1 TABLET BY MOUTH EVERY DAY 90 tablet 1   amoxicillin-clavulanate (AUGMENTIN) 875-125 MG tablet Take 1 tablet by mouth every 12 (twelve) hours. 4 tablet 0   B-D INS SYRINGE 0.5CC/31GX5/16 31G X 5/16" 0.5 ML MISC USE AS DIRECTED 4 TIMES A DAY 300 each 1   busPIRone (BUSPAR) 10 MG tablet Take 1 tablet (10 mg total) by mouth 3 (three) times daily. 270 tablet 1   carvedilol (COREG) 25 MG tablet Take 1 tablet (25 mg total) by mouth 2 (two) times daily with a meal. 180 tablet 3   cloNIDine (CATAPRES) 0.3 MG tablet TAKE 1 TABLET (0.3 MG TOTAL) BY MOUTH 2 (TWO) TIMES DAILY. 180 tablet 1   famotidine (PEPCID) 20 MG tablet TAKE 1 TABLET BY MOUTH EVERY DAY (Patient taking differently: Take 20 mg by mouth daily. ) 90 tablet 1   fluticasone (FLONASE) 50 MCG/ACT nasal spray Place 1 spray into  both nostrils daily. 16 g 2   fluticasone furoate-vilanterol (BREO ELLIPTA) 100-25 MCG/INH AEPB Inhale 1 puff into the lungs daily. 14 each 0   furosemide (LASIX) 20 MG tablet Take 20 mg by mouth daily.     HYDROcodone-acetaminophen (NORCO/VICODIN) 5-325 MG tablet Take 1 tablet by mouth every 8 (eight) hours as needed for moderate pain. 15 tablet 0   Insulin Glargine (LANTUS SOLOSTAR) 100 UNIT/ML Solostar Pen Inject 28  units into the skin in am (Patient taking differently: Inject 10-35 Units into the skin daily. If over 220 takes 35 units) 10 pen 3   Insulin Pen Needle 31G X 5 MM MISC Use pen needles for insulin injection daily 100 each 3   insulin regular (NOVOLIN R) 100 units/mL injection Inject 0.14-0.18 mLs (14-18 Units total) into the skin 3 (three) times daily before meals. Relion. Please provide insulin syringes needed (Patient taking differently: Inject 10-15 Units into the skin daily. ) 20 mL 11   Insulin Syringe-Needle U-100 (B-D INS SYRINGE 0.5CC/30GX1/2") 30G X 1/2" 0.5 ML MISC USE AS DIRECTED 4 TIMES A DAY 300 each 3   lisinopril-hydrochlorothiazide (ZESTORETIC) 20-12.5 MG tablet Take 1 tablet by mouth daily.     lovastatin (MEVACOR) 40 MG tablet TAKE 1 TABLET BY MOUTH EVERYDAY AT BEDTIME 90 tablet 3   montelukast (SINGULAIR) 10 MG tablet Take 1 tablet (10 mg total) by mouth at bedtime. 30 tablet 3   ondansetron (ZOFRAN) 8 MG tablet Take 1 tablet (8 mg total) by mouth 2 (two) times daily as needed (Nausea or vomiting). 30 tablet 1   polyethylene glycol powder (GLYCOLAX/MIRALAX) 17 GM/SCOOP powder Take 17 g by mouth daily. (Patient taking differently: Take 17 g by mouth as needed for mild constipation or moderate constipation. ) 1 g 0   traMADol (ULTRAM) 50 MG tablet TAKE 1 TABLET BY MOUTH EVERY 6 HOURS AS NEEDED FOR MODERATE/SEVERE PAIN (NECK AND SHOULDER PAIN). 30 tablet 0   XELODA 500 MG tablet Take 3 tablets (1,500 mg total) by mouth 2 (two) times daily after a meal. Take  for 14 days, then hold for 7 days. Repeat every 21 days. 84 tablet 0   prochlorperazine (COMPAZINE) 10 MG tablet Take 1 tablet (10 mg total) by mouth every 6 (six) hours as needed for nausea or vomiting. 30 tablet 1   No current facility-administered medications for this visit.    PHYSICAL EXAMINATION: ECOG PERFORMANCE STATUS: 1-2  Vitals:   04/07/20 1123  BP: (!) 115/56  Pulse: 68  Resp: 18  Temp: 98.6 F (37 C)  SpO2: 99%   Filed Weights   04/07/20 1123  Weight: 192 lb 11.2 oz (87.4 kg)    GENERAL:alert, no distress and comfortable SKIN: no rash to exposed skin EYES:  sclera clear LYMPH: left arm edema improving  LUNGS: decreased throughout, with normal breathing effort. O2 nasal cannula with 4 L HEART: regular rate & rhythm, no lower extremity edema ABDOMEN:abdomen soft, non-tender and normal bowel sounds. No rash or open skin to skin folds. No palpable mass  NEURO: alert & oriented x 3 with fluent speech, requires assist to exam table Breast: deferred   LABORATORY DATA:  I have reviewed the data as listed CBC Latest Ref Rng & Units 04/07/2020 03/23/2020 03/22/2020  WBC 4.0 - 10.5 K/uL 7.8 7.4 8.0  Hemoglobin 12.0 - 15.0 g/dL 8.6(L) 10.8(L) 9.9(L)  Hematocrit 36 - 46 % 25.8(L) 31.9(L) 30.0(L)  Platelets 150 - 400 K/uL 149(L) 321 279     CMP Latest Ref Rng & Units 04/07/2020 03/24/2020 03/23/2020  Glucose 70 - 99 mg/dL 141(H) 185(H) 213(H)  BUN 8 - 23 mg/dL 11 34(H) 23  Creatinine 0.44 - 1.00 mg/dL 1.56(H) 1.96(H) 1.54(H)  Sodium 135 - 145 mmol/L 142 136 136  Potassium 3.5 - 5.1 mmol/L 4.2 4.4 4.6  Chloride 98 - 111 mmol/L 99 98 99  CO2 22 - 32 mmol/L 36(H) 28 25  Calcium 8.9 - 10.3  mg/dL 9.1 8.5(L) 9.2  Total Protein 6.5 - 8.1 g/dL 6.4(L) - -  Total Bilirubin 0.3 - 1.2 mg/dL 0.5 - -  Alkaline Phos 38 - 126 U/L 121 - -  AST 15 - 41 U/L 11(L) - -  ALT 0 - 44 U/L 6 - -      RADIOGRAPHIC STUDIES: I have personally reviewed the radiological images as listed and  agreed with the findings in the report. No results found.   ASSESSMENT & PLAN: 79 yo female with   1. Malignant neoplasm of upper-outer quadrant of left breast, Invasive Ductal Carcinoma, pT2pN1M0, stage IIB, ER+/PR-/HER2-, G3, mammaprint high risk, axillarynodesrecurrence in 06/2019, lung nodules in 10/2019, metastatic to lung, bone, LNs, liver in 01/2020 -She was initiallydiagnosed in 11/2016. She was treated with left lumpectomy and SLN sampling,re-excision surgery, Adjuvant TC for 4 cycles andadjuvantradiation.  -She was on adjuvant Letrozole from 06/2017-08/14/19 -Unfortunately she had local recurrence in left axillaandunderwent Left axillary lymph node dissection by Dr. Brantley Stage on 08/05/19.She had complete resection. -ShecontinuedExemestane and addedoral biological agent Verzenio 174m BID starting3/4/21. -PETscanfrom 4/7/21showedwide spread disease with soft tissue recurrence in the left axilla, enlarging LNs in chest, new lung pleural and parenchymal disease, metastatic disease of the right liver lobe and signs of bone metastasis. There is also new pleural effusions presumably malignant and associated with pleural nodularity.Shehaddeveloped fatigue, dyspnea, and anorexia lately, likely from her metastatic disease. -Based on the scan findings,Dr. FBurr Medicodid not recommend biopsy given the clinical picture.  - FO results showedRET, CDK8, NOTCH1, TP53 mutations.Notargetable mutation for now.  -Due to the rapid disease progression through AI and CDK4/6 inhibitor,and multiple visceral organ involvement,Dr. FBurr Medicorecommended single agent Abraxane874mm2 weekly,3 weeks on/1 week off which she began on 01/20/20.  Discontinued after 5/24 due to disease progression on CT CAP from 03/02/2020, and clinically she developed worsening bilateral pleural effusion.  Cytology positive for malignant cells consistent with adenocarcinoma, ER/PR/HER-2 negative, negative HER-2 by FISH -PD-L1-negative,  not a candidate for immunotherapy -She began Oral Xeloda 1500 mg twice daily on days 1 -14 q. 21 days on 03/19/2020  2. Low appetite, nausea, B/l abdominal pain, Diarrhea/constipation -Chronic -More recently related to Xeloda, mild intermittent nausea is partially controlled with Zofran.  I prescribed Compazine today  3. Dry cough and SOB,lung metastasis -her baseline cough and exertional dyspnea progressed lately around the time she had her PET scan in 01/2020, fluctuates  -Her PCP referred her to pulmonologist Dr. MaAnnye Englishanages inhalers/respiratory meds -She has receivedboth herCOVID19 vaccinations. -Her PET from 01/07/20 showed new pulmonary pleural and parenchymal disease and presumably malignant pleural effusions. Overall indications of her breast cancer metastasized to lung. -Dyspnea progressed over the last few months, seen by pulmonary on 4/30 who treated her for asthma exacerbation with breo, singulair and prednisone which she will completed on 5/4. Dyspnea did not significantly improve on this regimen  -Subsequent imaging showed worsening pleural effusion status post multiple thoracentesis.  Path confirmed malignant cells consistent with adenocarcinoma -Pleurx could not be placed during most recent hospital admission, follow-up with pulmonology -Respiratory status at baseline today on 4 L O2  4. Left arm numbness/tinglingandLeft arm lymphedema, secondary to surgery. -secondary to malignant left axillary adenopathy, s/p ALND in 08/2019 -PT 1-2 times per week -left hand/arm LE improving   5. HTN, DM, CKD III -F/u with PCPand endocrinologist.  -DM is not well controlled. she is on insulin.  -SCr fluctuates, up to 1.96 during recent hospitalization -improved to 1.56 today   6. Mild  anemia -Iron and TIBC, B12, MMA normal -anemia is secondary to metastatic cancer and subsequent treatment. HGB 8.6 today but not overly symptomatic   7. Osteopenia  -09/2018  DEXA shows osteopeniawith lowest T-score -1.1 -She recently stopped AI due to disease progression -Next DEXA in 09/2020  8. Financialand SocialSupport  -She paid $165 out of pocket for her exemestane, which she stopped in 01/2020 due to disease progression -Cancontact financial advocatefor copay assistance grant as needed -She gets help with her son but he is looking to return to working. -Sw assistance as needed  9. Goal of care discussion, DNR -we reviewed that due to her rapid disease progression, her cancer is not curable with chemo alone at this point. The goal is palliative, to prolong her life and improve quality of life. She understands and agrees.   Disposition: Ms. Hanlon appears stable.  She has recovered well from recent hospitalization, pleurX could not be placed. Her respiratory status is stable on 4 L O2 without increased dyspnea. She knows to call clinic or her pulmonologist Dr. Vaughan Browner for worsening cough or dyspnea, at which point we can re-evaluate pleurX.  She completed cycle 1 day 14 Xeloda 1500 mg BID on 04/01/2020.  She tolerated well with intermittent nausea, partially managed with Zofran.  She has lost some weight since 6/17 but unclear if some of that was during her hospitalization. I recommend to add Compazine 10 mg q6h PRN.  Otherwise she tolerated well and is able to recover.  CBC shows Hgb 8.7, PLT 149.  CMP is stable, SCR 1.56 improved from hospitalization to her baseline range.  Labs adequate to proceed with cycle 2 Xeloda on 04/09/2020.  I will refill now.  Her son will call home health to have labs drawn on 7/15 to monitor her hemoglobin to see if she needs a blood transfusion.  They will fax the report to our clinic.  We will see her back for lab and follow-up in 3 weeks before cycle 3, or sooner if needed.  All questions were answered. The patient knows to call the clinic with any problems, questions or concerns. No barriers to learning was  detected. Total encounter time was 30 minutes.      Wendy Feeling, NP 04/07/20

## 2020-04-07 ENCOUNTER — Inpatient Hospital Stay: Payer: Medicare Other

## 2020-04-07 ENCOUNTER — Other Ambulatory Visit: Payer: Self-pay

## 2020-04-07 ENCOUNTER — Inpatient Hospital Stay: Payer: Medicare Other | Attending: Hematology | Admitting: Nurse Practitioner

## 2020-04-07 ENCOUNTER — Other Ambulatory Visit: Payer: Self-pay | Admitting: Hematology

## 2020-04-07 ENCOUNTER — Encounter: Payer: Self-pay | Admitting: Nurse Practitioner

## 2020-04-07 VITALS — BP 115/56 | HR 68 | Temp 98.6°F | Resp 18 | Ht 62.0 in | Wt 192.7 lb

## 2020-04-07 DIAGNOSIS — C50412 Malignant neoplasm of upper-outer quadrant of left female breast: Secondary | ICD-10-CM | POA: Diagnosis not present

## 2020-04-07 DIAGNOSIS — F419 Anxiety disorder, unspecified: Secondary | ICD-10-CM | POA: Insufficient documentation

## 2020-04-07 DIAGNOSIS — J9622 Acute and chronic respiratory failure with hypercapnia: Secondary | ICD-10-CM | POA: Diagnosis not present

## 2020-04-07 DIAGNOSIS — J45909 Unspecified asthma, uncomplicated: Secondary | ICD-10-CM | POA: Insufficient documentation

## 2020-04-07 DIAGNOSIS — N183 Chronic kidney disease, stage 3 unspecified: Secondary | ICD-10-CM | POA: Insufficient documentation

## 2020-04-07 DIAGNOSIS — Z9221 Personal history of antineoplastic chemotherapy: Secondary | ICD-10-CM | POA: Insufficient documentation

## 2020-04-07 DIAGNOSIS — J9621 Acute and chronic respiratory failure with hypoxia: Secondary | ICD-10-CM | POA: Diagnosis not present

## 2020-04-07 DIAGNOSIS — Z66 Do not resuscitate: Secondary | ICD-10-CM | POA: Diagnosis not present

## 2020-04-07 DIAGNOSIS — R0602 Shortness of breath: Secondary | ICD-10-CM | POA: Diagnosis not present

## 2020-04-07 DIAGNOSIS — C50919 Malignant neoplasm of unspecified site of unspecified female breast: Secondary | ICD-10-CM

## 2020-04-07 DIAGNOSIS — E785 Hyperlipidemia, unspecified: Secondary | ICD-10-CM | POA: Insufficient documentation

## 2020-04-07 DIAGNOSIS — J189 Pneumonia, unspecified organism: Secondary | ICD-10-CM | POA: Diagnosis not present

## 2020-04-07 DIAGNOSIS — Z79899 Other long term (current) drug therapy: Secondary | ICD-10-CM | POA: Insufficient documentation

## 2020-04-07 DIAGNOSIS — F329 Major depressive disorder, single episode, unspecified: Secondary | ICD-10-CM | POA: Insufficient documentation

## 2020-04-07 DIAGNOSIS — Z79811 Long term (current) use of aromatase inhibitors: Secondary | ICD-10-CM | POA: Insufficient documentation

## 2020-04-07 DIAGNOSIS — Z794 Long term (current) use of insulin: Secondary | ICD-10-CM | POA: Insufficient documentation

## 2020-04-07 DIAGNOSIS — C7951 Secondary malignant neoplasm of bone: Secondary | ICD-10-CM | POA: Insufficient documentation

## 2020-04-07 DIAGNOSIS — Z7951 Long term (current) use of inhaled steroids: Secondary | ICD-10-CM | POA: Insufficient documentation

## 2020-04-07 DIAGNOSIS — I517 Cardiomegaly: Secondary | ICD-10-CM | POA: Diagnosis not present

## 2020-04-07 DIAGNOSIS — J811 Chronic pulmonary edema: Secondary | ICD-10-CM | POA: Diagnosis not present

## 2020-04-07 DIAGNOSIS — M8580 Other specified disorders of bone density and structure, unspecified site: Secondary | ICD-10-CM | POA: Insufficient documentation

## 2020-04-07 DIAGNOSIS — D649 Anemia, unspecified: Secondary | ICD-10-CM | POA: Insufficient documentation

## 2020-04-07 DIAGNOSIS — I10 Essential (primary) hypertension: Secondary | ICD-10-CM | POA: Insufficient documentation

## 2020-04-07 DIAGNOSIS — C7801 Secondary malignant neoplasm of right lung: Secondary | ICD-10-CM | POA: Insufficient documentation

## 2020-04-07 DIAGNOSIS — E119 Type 2 diabetes mellitus without complications: Secondary | ICD-10-CM | POA: Insufficient documentation

## 2020-04-07 DIAGNOSIS — Z923 Personal history of irradiation: Secondary | ICD-10-CM | POA: Insufficient documentation

## 2020-04-07 DIAGNOSIS — J90 Pleural effusion, not elsewhere classified: Secondary | ICD-10-CM | POA: Diagnosis not present

## 2020-04-07 DIAGNOSIS — Z20822 Contact with and (suspected) exposure to covid-19: Secondary | ICD-10-CM | POA: Diagnosis not present

## 2020-04-07 LAB — COMPREHENSIVE METABOLIC PANEL
ALT: 6 U/L (ref 0–44)
AST: 11 U/L — ABNORMAL LOW (ref 15–41)
Albumin: 3.2 g/dL — ABNORMAL LOW (ref 3.5–5.0)
Alkaline Phosphatase: 121 U/L (ref 38–126)
Anion gap: 7 (ref 5–15)
BUN: 11 mg/dL (ref 8–23)
CO2: 36 mmol/L — ABNORMAL HIGH (ref 22–32)
Calcium: 9.1 mg/dL (ref 8.9–10.3)
Chloride: 99 mmol/L (ref 98–111)
Creatinine, Ser: 1.56 mg/dL — ABNORMAL HIGH (ref 0.44–1.00)
GFR calc Af Amer: 37 mL/min — ABNORMAL LOW (ref >60)
GFR calc non Af Amer: 31 mL/min — ABNORMAL LOW (ref >60)
Glucose, Bld: 141 mg/dL — ABNORMAL HIGH (ref 70–99)
Potassium: 4.2 mmol/L (ref 3.5–5.1)
Sodium: 142 mmol/L (ref 135–145)
Total Bilirubin: 0.5 mg/dL (ref 0.3–1.2)
Total Protein: 6.4 g/dL — ABNORMAL LOW (ref 6.5–8.1)

## 2020-04-07 LAB — CBC WITH DIFFERENTIAL/PLATELET
Abs Immature Granulocytes: 0.09 10*3/uL — ABNORMAL HIGH (ref 0.00–0.07)
Basophils Absolute: 0 10*3/uL (ref 0.0–0.1)
Basophils Relative: 0 %
Eosinophils Absolute: 0.3 10*3/uL (ref 0.0–0.5)
Eosinophils Relative: 4 %
HCT: 25.8 % — ABNORMAL LOW (ref 36.0–46.0)
Hemoglobin: 8.6 g/dL — ABNORMAL LOW (ref 12.0–15.0)
Immature Granulocytes: 1 %
Lymphocytes Relative: 12 %
Lymphs Abs: 0.9 10*3/uL (ref 0.7–4.0)
MCH: 30.3 pg (ref 26.0–34.0)
MCHC: 33.3 g/dL (ref 30.0–36.0)
MCV: 90.8 fL (ref 80.0–100.0)
Monocytes Absolute: 0.6 10*3/uL (ref 0.1–1.0)
Monocytes Relative: 7 %
Neutro Abs: 6 10*3/uL (ref 1.7–7.7)
Neutrophils Relative %: 76 %
Platelets: 149 10*3/uL — ABNORMAL LOW (ref 150–400)
RBC: 2.84 MIL/uL — ABNORMAL LOW (ref 3.87–5.11)
RDW: 16.4 % — ABNORMAL HIGH (ref 11.5–15.5)
WBC: 7.8 10*3/uL (ref 4.0–10.5)
nRBC: 0 % (ref 0.0–0.2)

## 2020-04-07 MED ORDER — PROCHLORPERAZINE MALEATE 10 MG PO TABS: 10.0000 mg | ORAL_TABLET | Freq: Four times a day (QID) | ORAL | 1 refills | Status: AC | PRN

## 2020-04-07 MED ORDER — TRAMADOL HCL 50 MG PO TABS: ORAL_TABLET | ORAL | 0 refills | Status: DC

## 2020-04-07 MED ORDER — XELODA 500 MG PO TABS: 1500.0000 mg | ORAL_TABLET | Freq: Two times a day (BID) | ORAL | 1 refills | Status: AC

## 2020-04-07 MED FILL — PROCHLORPERAZINE 10 MG TAB: 10 | 7 days supply | Qty: 30 | Fill #0

## 2020-04-07 MED FILL — traMADol HCL 50 MG TABS: 50 | 7 days supply | Qty: 30 | Fill #0

## 2020-04-08 ENCOUNTER — Encounter (HOSPITAL_COMMUNITY): Payer: Self-pay | Admitting: Emergency Medicine

## 2020-04-08 ENCOUNTER — Emergency Department (HOSPITAL_COMMUNITY): Payer: Medicare Other

## 2020-04-08 ENCOUNTER — Telehealth: Payer: Self-pay | Admitting: Hematology

## 2020-04-08 ENCOUNTER — Inpatient Hospital Stay (HOSPITAL_COMMUNITY)
Admission: EM | Admit: 2020-04-08 | Discharge: 2020-04-13 | DRG: 597 | Disposition: A | Discharge status: Home / Self Care | Payer: Medicare Other | Attending: Internal Medicine | Admitting: Internal Medicine

## 2020-04-08 ENCOUNTER — Other Ambulatory Visit: Payer: Self-pay

## 2020-04-08 DIAGNOSIS — E1122 Type 2 diabetes mellitus with diabetic chronic kidney disease: Secondary | ICD-10-CM | POA: Diagnosis present

## 2020-04-08 DIAGNOSIS — F329 Major depressive disorder, single episode, unspecified: Secondary | ICD-10-CM | POA: Diagnosis present

## 2020-04-08 DIAGNOSIS — J189 Pneumonia, unspecified organism: Secondary | ICD-10-CM | POA: Diagnosis present

## 2020-04-08 DIAGNOSIS — J9621 Acute and chronic respiratory failure with hypoxia: Secondary | ICD-10-CM | POA: Diagnosis not present

## 2020-04-08 DIAGNOSIS — T451X5A Adverse effect of antineoplastic and immunosuppressive drugs, initial encounter: Secondary | ICD-10-CM | POA: Diagnosis present

## 2020-04-08 DIAGNOSIS — N183 Chronic kidney disease, stage 3 unspecified: Secondary | ICD-10-CM | POA: Diagnosis present

## 2020-04-08 DIAGNOSIS — Z833 Family history of diabetes mellitus: Secondary | ICD-10-CM

## 2020-04-08 DIAGNOSIS — C50412 Malignant neoplasm of upper-outer quadrant of left female breast: Secondary | ICD-10-CM

## 2020-04-08 DIAGNOSIS — Z8701 Personal history of pneumonia (recurrent): Secondary | ICD-10-CM

## 2020-04-08 DIAGNOSIS — I10 Essential (primary) hypertension: Secondary | ICD-10-CM | POA: Diagnosis not present

## 2020-04-08 DIAGNOSIS — R0602 Shortness of breath: Secondary | ICD-10-CM

## 2020-04-08 DIAGNOSIS — Z794 Long term (current) use of insulin: Secondary | ICD-10-CM | POA: Diagnosis not present

## 2020-04-08 DIAGNOSIS — Z9981 Dependence on supplemental oxygen: Secondary | ICD-10-CM

## 2020-04-08 DIAGNOSIS — Z66 Do not resuscitate: Secondary | ICD-10-CM | POA: Diagnosis present

## 2020-04-08 DIAGNOSIS — F419 Anxiety disorder, unspecified: Secondary | ICD-10-CM | POA: Diagnosis present

## 2020-04-08 DIAGNOSIS — J811 Chronic pulmonary edema: Secondary | ICD-10-CM | POA: Diagnosis not present

## 2020-04-08 DIAGNOSIS — J9622 Acute and chronic respiratory failure with hypercapnia: Secondary | ICD-10-CM | POA: Diagnosis present

## 2020-04-08 DIAGNOSIS — E113219 Type 2 diabetes mellitus with mild nonproliferative diabetic retinopathy with macular edema, unspecified eye: Secondary | ICD-10-CM | POA: Diagnosis present

## 2020-04-08 DIAGNOSIS — Z923 Personal history of irradiation: Secondary | ICD-10-CM

## 2020-04-08 DIAGNOSIS — E1165 Type 2 diabetes mellitus with hyperglycemia: Secondary | ICD-10-CM | POA: Diagnosis present

## 2020-04-08 DIAGNOSIS — K219 Gastro-esophageal reflux disease without esophagitis: Secondary | ICD-10-CM | POA: Diagnosis present

## 2020-04-08 DIAGNOSIS — Z7951 Long term (current) use of inhaled steroids: Secondary | ICD-10-CM

## 2020-04-08 DIAGNOSIS — Y95 Nosocomial condition: Secondary | ICD-10-CM | POA: Diagnosis present

## 2020-04-08 DIAGNOSIS — J90 Pleural effusion, not elsewhere classified: Secondary | ICD-10-CM

## 2020-04-08 DIAGNOSIS — J302 Other seasonal allergic rhinitis: Secondary | ICD-10-CM | POA: Diagnosis present

## 2020-04-08 DIAGNOSIS — C50919 Malignant neoplasm of unspecified site of unspecified female breast: Secondary | ICD-10-CM | POA: Diagnosis present

## 2020-04-08 DIAGNOSIS — Z79899 Other long term (current) drug therapy: Secondary | ICD-10-CM

## 2020-04-08 DIAGNOSIS — I129 Hypertensive chronic kidney disease with stage 1 through stage 4 chronic kidney disease, or unspecified chronic kidney disease: Secondary | ICD-10-CM | POA: Diagnosis not present

## 2020-04-08 DIAGNOSIS — J910 Malignant pleural effusion: Secondary | ICD-10-CM | POA: Diagnosis present

## 2020-04-08 DIAGNOSIS — J9601 Acute respiratory failure with hypoxia: Secondary | ICD-10-CM | POA: Diagnosis not present

## 2020-04-08 DIAGNOSIS — Z79891 Long term (current) use of opiate analgesic: Secondary | ICD-10-CM

## 2020-04-08 DIAGNOSIS — D649 Anemia, unspecified: Secondary | ICD-10-CM | POA: Diagnosis not present

## 2020-04-08 DIAGNOSIS — R61 Generalized hyperhidrosis: Secondary | ICD-10-CM | POA: Diagnosis not present

## 2020-04-08 DIAGNOSIS — J45901 Unspecified asthma with (acute) exacerbation: Secondary | ICD-10-CM | POA: Diagnosis not present

## 2020-04-08 DIAGNOSIS — R680 Hypothermia, not associated with low environmental temperature: Secondary | ICD-10-CM | POA: Diagnosis present

## 2020-04-08 DIAGNOSIS — D6481 Anemia due to antineoplastic chemotherapy: Secondary | ICD-10-CM | POA: Diagnosis present

## 2020-04-08 DIAGNOSIS — J440 Chronic obstructive pulmonary disease with acute lower respiratory infection: Secondary | ICD-10-CM | POA: Diagnosis present

## 2020-04-08 DIAGNOSIS — N179 Acute kidney failure, unspecified: Secondary | ICD-10-CM | POA: Diagnosis not present

## 2020-04-08 DIAGNOSIS — D630 Anemia in neoplastic disease: Secondary | ICD-10-CM | POA: Diagnosis present

## 2020-04-08 DIAGNOSIS — I517 Cardiomegaly: Secondary | ICD-10-CM | POA: Diagnosis not present

## 2020-04-08 DIAGNOSIS — J9602 Acute respiratory failure with hypercapnia: Secondary | ICD-10-CM | POA: Diagnosis not present

## 2020-04-08 DIAGNOSIS — Z8249 Family history of ischemic heart disease and other diseases of the circulatory system: Secondary | ICD-10-CM

## 2020-04-08 DIAGNOSIS — E669 Obesity, unspecified: Secondary | ICD-10-CM | POA: Diagnosis present

## 2020-04-08 DIAGNOSIS — N1831 Chronic kidney disease, stage 3a: Secondary | ICD-10-CM | POA: Diagnosis not present

## 2020-04-08 DIAGNOSIS — J9811 Atelectasis: Secondary | ICD-10-CM | POA: Diagnosis not present

## 2020-04-08 DIAGNOSIS — Z170 Estrogen receptor positive status [ER+]: Secondary | ICD-10-CM | POA: Diagnosis not present

## 2020-04-08 DIAGNOSIS — C7800 Secondary malignant neoplasm of unspecified lung: Secondary | ICD-10-CM | POA: Diagnosis present

## 2020-04-08 DIAGNOSIS — Z87891 Personal history of nicotine dependence: Secondary | ICD-10-CM

## 2020-04-08 DIAGNOSIS — E785 Hyperlipidemia, unspecified: Secondary | ICD-10-CM | POA: Diagnosis present

## 2020-04-08 DIAGNOSIS — Z20822 Contact with and (suspected) exposure to covid-19: Secondary | ICD-10-CM | POA: Diagnosis not present

## 2020-04-08 DIAGNOSIS — IMO0002 Reserved for concepts with insufficient information to code with codable children: Secondary | ICD-10-CM

## 2020-04-08 DIAGNOSIS — R0902 Hypoxemia: Secondary | ICD-10-CM | POA: Diagnosis not present

## 2020-04-08 DIAGNOSIS — D62 Acute posthemorrhagic anemia: Secondary | ICD-10-CM | POA: Diagnosis not present

## 2020-04-08 DIAGNOSIS — E113419 Type 2 diabetes mellitus with severe nonproliferative diabetic retinopathy with macular edema, unspecified eye: Secondary | ICD-10-CM | POA: Diagnosis not present

## 2020-04-08 DIAGNOSIS — F17200 Nicotine dependence, unspecified, uncomplicated: Secondary | ICD-10-CM | POA: Diagnosis not present

## 2020-04-08 LAB — SARS CORONAVIRUS 2 BY RT PCR (HOSPITAL ORDER, PERFORMED IN ~~LOC~~ HOSPITAL LAB): SARS Coronavirus 2: NEGATIVE

## 2020-04-08 LAB — CBC WITH DIFFERENTIAL/PLATELET
Abs Immature Granulocytes: 0.05 10*3/uL (ref 0.00–0.07)
Basophils Absolute: 0 10*3/uL (ref 0.0–0.1)
Basophils Relative: 0 %
Eosinophils Absolute: 0.1 10*3/uL (ref 0.0–0.5)
Eosinophils Relative: 1 %
HCT: 27.3 % — ABNORMAL LOW (ref 36.0–46.0)
Hemoglobin: 9.1 g/dL — ABNORMAL LOW (ref 12.0–15.0)
Immature Granulocytes: 1 %
Lymphocytes Relative: 6 %
Lymphs Abs: 0.5 10*3/uL — ABNORMAL LOW (ref 0.7–4.0)
MCH: 29.8 pg (ref 26.0–34.0)
MCHC: 33.3 g/dL (ref 30.0–36.0)
MCV: 89.5 fL (ref 80.0–100.0)
Monocytes Absolute: 0.2 10*3/uL (ref 0.1–1.0)
Monocytes Relative: 2 %
Neutro Abs: 7.5 10*3/uL (ref 1.7–7.7)
Neutrophils Relative %: 90 %
Platelets: 174 10*3/uL (ref 150–400)
RBC: 3.05 MIL/uL — ABNORMAL LOW (ref 3.87–5.11)
RDW: 16.7 % — ABNORMAL HIGH (ref 11.5–15.5)
WBC: 8.4 10*3/uL (ref 4.0–10.5)
nRBC: 0.2 % (ref 0.0–0.2)

## 2020-04-08 LAB — BRAIN NATRIURETIC PEPTIDE: B Natriuretic Peptide: 201.2 pg/mL — ABNORMAL HIGH (ref 0.0–100.0)

## 2020-04-08 LAB — BASIC METABOLIC PANEL
Anion gap: 12 (ref 5–15)
BUN: 18 mg/dL (ref 8–23)
CO2: 32 mmol/L (ref 22–32)
Calcium: 8.7 mg/dL — ABNORMAL LOW (ref 8.9–10.3)
Chloride: 96 mmol/L — ABNORMAL LOW (ref 98–111)
Creatinine, Ser: 1.91 mg/dL — ABNORMAL HIGH (ref 0.44–1.00)
GFR calc Af Amer: 29 mL/min — ABNORMAL LOW (ref >60)
GFR calc non Af Amer: 25 mL/min — ABNORMAL LOW (ref >60)
Glucose, Bld: 278 mg/dL — ABNORMAL HIGH (ref 70–99)
Potassium: 4.4 mmol/L (ref 3.5–5.1)
Sodium: 140 mmol/L (ref 135–145)

## 2020-04-08 LAB — URINALYSIS, ROUTINE W REFLEX MICROSCOPIC
Bacteria, UA: NONE SEEN
Bilirubin Urine: NEGATIVE
Glucose, UA: 50 mg/dL — AB
Ketones, ur: NEGATIVE mg/dL
Leukocytes,Ua: NEGATIVE
Nitrite: NEGATIVE
Protein, ur: >=300 mg/dL — AB
Specific Gravity, Urine: 1.014 (ref 1.005–1.030)
pH: 7 (ref 5.0–8.0)

## 2020-04-08 LAB — LACTIC ACID, PLASMA
Lactic Acid, Venous: 1.4 mmol/L (ref 0.5–1.9)
Lactic Acid, Venous: 1.3 mmol/L (ref 0.5–1.9)

## 2020-04-08 LAB — TSH: TSH: 9.457 u[IU]/mL — ABNORMAL HIGH (ref 0.350–4.500)

## 2020-04-08 LAB — GLUCOSE, CAPILLARY: Glucose-Capillary: 318 mg/dL — ABNORMAL HIGH (ref 70–99)

## 2020-04-08 LAB — PROCALCITONIN: Procalcitonin: 0.14 ng/mL

## 2020-04-08 LAB — TROPONIN I (HIGH SENSITIVITY)
Troponin I (High Sensitivity): 12 ng/L (ref <18)
Troponin I (High Sensitivity): 13 ng/L (ref <18)

## 2020-04-08 LAB — CBG MONITORING, ED: Glucose-Capillary: 272 mg/dL — ABNORMAL HIGH (ref 70–99)

## 2020-04-08 MED ORDER — ALBUTEROL SULFATE (2.5 MG/3ML) 0.083% IN NEBU
2.5000 mg | INHALATION_SOLUTION | Freq: Four times a day (QID) | RESPIRATORY_TRACT | Status: DC
  Administered 2020-04-08 – 2020-04-09 (×5): 2.5 mg via RESPIRATORY_TRACT
  Filled 2020-04-08 (×4): qty 3

## 2020-04-08 MED ORDER — ACETAMINOPHEN 650 MG RE SUPP: 650.0000 mg | Freq: Four times a day (QID) | RECTAL | Status: DC | PRN

## 2020-04-08 MED ORDER — CAPECITABINE 500 MG PO TABS: 1500.0000 mg | ORAL_TABLET | Freq: Two times a day (BID) | ORAL | Status: DC

## 2020-04-08 MED ORDER — PRAVASTATIN SODIUM 20 MG PO TABS
20.0000 mg | ORAL_TABLET | Freq: Every day | ORAL | Status: DC
  Administered 2020-04-09 – 2020-04-12 (×4): 20 mg via ORAL
  Filled 2020-04-08 (×4): qty 1

## 2020-04-08 MED ORDER — PROCHLORPERAZINE MALEATE 10 MG PO TABS: 10.0000 mg | ORAL_TABLET | Freq: Four times a day (QID) | ORAL | Status: DC | PRN

## 2020-04-08 MED ORDER — TRAMADOL HCL 50 MG PO TABS
50.0000 mg | ORAL_TABLET | Freq: Four times a day (QID) | ORAL | Status: DC | PRN
  Administered 2020-04-12 – 2020-04-13 (×2): 50 mg via ORAL
  Filled 2020-04-08 (×2): qty 1

## 2020-04-08 MED ORDER — INSULIN ASPART 100 UNIT/ML ~~LOC~~ SOLN
0.0000 [IU] | Freq: Three times a day (TID) | SUBCUTANEOUS | Status: DC
  Administered 2020-04-09: 9 [IU] via SUBCUTANEOUS
  Administered 2020-04-09: 5 [IU] via SUBCUTANEOUS
  Administered 2020-04-09: 9 [IU] via SUBCUTANEOUS
  Administered 2020-04-10 (×2): 15 [IU] via SUBCUTANEOUS
  Administered 2020-04-10: 10 [IU] via SUBCUTANEOUS

## 2020-04-08 MED ORDER — FUROSEMIDE 20 MG PO TABS
20.0000 mg | ORAL_TABLET | Freq: Every day | ORAL | Status: DC
  Administered 2020-04-09 – 2020-04-13 (×5): 20 mg via ORAL
  Filled 2020-04-08 (×6): qty 1

## 2020-04-08 MED ORDER — METHYLPREDNISOLONE SODIUM SUCC 125 MG IJ SOLR
125.0000 mg | Freq: Once | INTRAMUSCULAR | Status: AC
  Administered 2020-04-08: 125 mg via INTRAVENOUS
  Filled 2020-04-08: qty 2

## 2020-04-08 MED ORDER — SENNOSIDES-DOCUSATE SODIUM 8.6-50 MG PO TABS: 1.0000 | ORAL_TABLET | Freq: Every evening | ORAL | Status: DC | PRN

## 2020-04-08 MED ORDER — ENOXAPARIN SODIUM 30 MG/0.3ML ~~LOC~~ SOLN
30.0000 mg | SUBCUTANEOUS | Status: DC
  Administered 2020-04-08 – 2020-04-10 (×3): 30 mg via SUBCUTANEOUS
  Filled 2020-04-08 (×3): qty 0.3

## 2020-04-08 MED ORDER — BUSPIRONE HCL 5 MG PO TABS
10.0000 mg | ORAL_TABLET | Freq: Three times a day (TID) | ORAL | Status: DC
  Administered 2020-04-08 – 2020-04-13 (×14): 10 mg via ORAL
  Filled 2020-04-08 (×15): qty 2

## 2020-04-08 MED ORDER — SODIUM CHLORIDE 0.9 % IV SOLN
2.0000 g | Freq: Once | INTRAVENOUS | Status: AC
  Administered 2020-04-08: 2 g via INTRAVENOUS
  Filled 2020-04-08: qty 2

## 2020-04-08 MED ORDER — METHYLPREDNISOLONE SODIUM SUCC 40 MG IJ SOLR
40.0000 mg | Freq: Two times a day (BID) | INTRAMUSCULAR | Status: DC
  Administered 2020-04-08 – 2020-04-10 (×5): 40 mg via INTRAVENOUS
  Filled 2020-04-08 (×5): qty 1

## 2020-04-08 MED ORDER — FLUTICASONE FUROATE-VILANTEROL 100-25 MCG/INH IN AEPB
1.0000 | INHALATION_SPRAY | Freq: Every day | RESPIRATORY_TRACT | Status: DC
  Administered 2020-04-09 – 2020-04-13 (×3): 1 via RESPIRATORY_TRACT
  Filled 2020-04-08: qty 28

## 2020-04-08 MED ORDER — ONDANSETRON HCL 4 MG/2ML IJ SOLN: 4.0000 mg | Freq: Four times a day (QID) | INTRAMUSCULAR | Status: DC | PRN

## 2020-04-08 MED ORDER — INSULIN GLARGINE 100 UNIT/ML ~~LOC~~ SOLN
28.0000 [IU] | Freq: Every day | SUBCUTANEOUS | Status: DC
  Administered 2020-04-08 – 2020-04-10 (×3): 28 [IU] via SUBCUTANEOUS
  Filled 2020-04-08 (×3): qty 0.28

## 2020-04-08 MED ORDER — HYDRALAZINE HCL 20 MG/ML IJ SOLN: 10.0000 mg | Freq: Four times a day (QID) | INTRAMUSCULAR | Status: DC | PRN

## 2020-04-08 MED ORDER — VANCOMYCIN HCL 1500 MG/300ML IV SOLN
1500.0000 mg | Freq: Once | INTRAVENOUS | Status: AC
  Administered 2020-04-08: 1500 mg via INTRAVENOUS
  Filled 2020-04-08: qty 300

## 2020-04-08 MED ORDER — DOCUSATE SODIUM 100 MG PO CAPS
100.0000 mg | ORAL_CAPSULE | Freq: Two times a day (BID) | ORAL | Status: DC
  Administered 2020-04-09 – 2020-04-13 (×9): 100 mg via ORAL
  Filled 2020-04-08 (×10): qty 1

## 2020-04-08 MED ORDER — ONDANSETRON HCL 4 MG PO TABS: 4.0000 mg | ORAL_TABLET | Freq: Four times a day (QID) | ORAL | Status: DC | PRN

## 2020-04-08 MED ORDER — MONTELUKAST SODIUM 10 MG PO TABS
10.0000 mg | ORAL_TABLET | Freq: Every day | ORAL | Status: DC
  Administered 2020-04-08 – 2020-04-12 (×5): 10 mg via ORAL
  Filled 2020-04-08 (×5): qty 1

## 2020-04-08 MED ORDER — CARVEDILOL 25 MG PO TABS
25.0000 mg | ORAL_TABLET | Freq: Two times a day (BID) | ORAL | Status: DC
  Administered 2020-04-09 – 2020-04-13 (×9): 25 mg via ORAL
  Filled 2020-04-08 (×9): qty 1

## 2020-04-08 MED ORDER — ACETAMINOPHEN 325 MG PO TABS: 650.0000 mg | ORAL_TABLET | Freq: Four times a day (QID) | ORAL | Status: DC | PRN

## 2020-04-08 MED ORDER — SODIUM CHLORIDE 0.9 % IV SOLN
2.0000 g | INTRAVENOUS | Status: DC
  Filled 2020-04-08: qty 2

## 2020-04-08 MED ORDER — ALBUTEROL SULFATE (2.5 MG/3ML) 0.083% IN NEBU
INHALATION_SOLUTION | RESPIRATORY_TRACT | Status: AC
  Filled 2020-04-08: qty 3

## 2020-04-08 MED ORDER — GUAIFENESIN ER 600 MG PO TB12
600.0000 mg | ORAL_TABLET | Freq: Two times a day (BID) | ORAL | Status: DC
  Administered 2020-04-08 – 2020-04-13 (×10): 600 mg via ORAL
  Filled 2020-04-08 (×10): qty 1

## 2020-04-08 MED ORDER — HYDROCODONE-ACETAMINOPHEN 5-325 MG PO TABS
1.0000 | ORAL_TABLET | Freq: Three times a day (TID) | ORAL | Status: DC | PRN
  Administered 2020-04-12 – 2020-04-13 (×3): 1 via ORAL
  Filled 2020-04-08 (×3): qty 1

## 2020-04-08 MED ORDER — AMLODIPINE BESYLATE 10 MG PO TABS
10.0000 mg | ORAL_TABLET | Freq: Every day | ORAL | Status: DC
  Administered 2020-04-09 – 2020-04-13 (×5): 10 mg via ORAL
  Filled 2020-04-08 (×5): qty 1

## 2020-04-08 MED ORDER — CLONIDINE HCL 0.2 MG PO TABS
0.3000 mg | ORAL_TABLET | Freq: Two times a day (BID) | ORAL | Status: DC
  Administered 2020-04-08 – 2020-04-13 (×10): 0.3 mg via ORAL
  Filled 2020-04-08 (×10): qty 1

## 2020-04-08 MED ORDER — FAMOTIDINE 20 MG PO TABS
20.0000 mg | ORAL_TABLET | Freq: Every day | ORAL | Status: DC
  Administered 2020-04-09 – 2020-04-13 (×5): 20 mg via ORAL
  Filled 2020-04-08 (×5): qty 1

## 2020-04-08 MED ORDER — VANCOMYCIN HCL IN DEXTROSE 1-5 GM/200ML-% IV SOLN: 1000.0000 mg | INTRAVENOUS | Status: DC

## 2020-04-08 MED FILL — XELODA 500 MG TABLET: 500 | 21 days supply | Qty: 84 | Fill #0

## 2020-04-08 NOTE — ED Notes (Addendum)
Patient arrives via [facility] from home c/o SOB.  Patient is on 4L chronic oxygen at baseline.  Per daughter, oxygen sats were in the 14s.  Fire department arrived and patient placed on NRB at 15L with improvement in oxygen sats to 100%.  Patient has left sided stage 4 breat cancer, left arm restricted. Lung sounds diminished per EMS and patient was cold/clammy.  EMS vitals were 158/90, 70-P, CBG 294 and 100% NRB.

## 2020-04-08 NOTE — ED Notes (Signed)
ED Provider at bedside. 

## 2020-04-08 NOTE — ED Provider Notes (Signed)
[facility] DEPT Provider Note   CSN: [id number] Arrival date & time: 04/08/20  1156     History Chief Complaint  Patient presents with  . Shortness of Breath    Wendy [name] is a 79 y.o. female with past medical history significant for metastatic breast cancer on chemotherapy, obesity, HTN, stage III CKD, type II DM, and recent admission to the hospital on 03/22/2020 for community-acquired pneumonia and shortness of breath symptoms who presents to the ED via EMS for shortness of breath symptoms.  Patient is accompanied by her daughter who reports that she typically is 92% on 4 L supplemental O2 at home via [place].  This morning however she was saturating in the 50s and improved to 70s while on 6 L supplemental O2.  Patient is hypothermic on arrival to 93.4 F, but is saturating 100% on 10 L high flow O2 via NRB that was placed by EMS.  Will transition to high flow via [place].  She is not demonstrating any increased work of breathing.  Evidently patient was doing fine yesterday until last evening when she became mildly short of breath.  This morning she feels as though she cannot catch her breath whatsoever.  Patient's daughter states that she appeared to be confused and suspects that this could be related to her hypoxia.  She is DNR for her metastatic breast cancer.  Patient is also endorsing mild wheezing and took her Breo this morning, without significant relief.  While patient endorses subjective "cold" symptoms, she and her daughter deny any documented fevers, cough, abdominal pain, nausea or vomiting, pleuritic symptoms, history of clots, leg swelling or weight gain, orthopnea, or other symptoms.  HPI     Past Medical History:  Diagnosis Date  . Abnormal breast finding 2018   per pt/ having  a lot drainage from left breast nipple  . Anemia   . Anxiety   . Breast cancer ([place]) 11/2016   left/  . CKD (chronic kidney disease)   . Depression   . GERD  (gastroesophageal reflux disease)    TUMS as needed  . HTN (hypertension)    states BP has been high recently; has been on med. x 20 yr.  . Hyperlipidemia   . Hyperplastic colon polyp   . HYPERTENSION, BENIGN SYSTEMIC 11/29/2006   Lisinopril hctz 10-12.45m, amlodipine 155m coreg 2587mID, clonidine 0.3 mg BID.   . IMarland Kitchensulin dependent diabetes mellitus   . Personal history of chemotherapy    2018/finished 6 weeks of chemo in Sep 2018  . Personal history of radiation therapy    2018 left breast/finished radiation in Sept 2018 per pt.    Patient Active Problem List   Diagnosis Date Noted  . CAP (community acquired pneumonia) 03/25/2020  . DNR (do not resuscitate) 03/24/2020  . Malignant pericardial effusion ([facility]CC[place] [facility]6/22/2021  . Acute respiratory failure with hypoxia ([facility]6/21/2021  . Respiratory distress   . Palliative care by specialist   . 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 Metastatic breast cancer ([facility]6/10/2019  . Advanced care planning/counseling discussion   . Moderate persistent asthma 01/31/2020  . Allergic rhinitis 01/31/2020  . GERD (gastroesophageal reflux disease) 01/31/2020  . Goals of care, counseling/discussion 01/08/2020  . Lymphedema of left arm 09/03/2019  . 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[facility]CC[place]3/01/2019  . Grief 05/10/2018  . Anemia 01/09/2018  . Fatigue 03/14/2017  . Edema 03/14/2017  . Shortness of breath 03/14/2017  . Port catheter in place 01/18/2017  .  Rectal bleeding 01/17/2017  . Constipation 01/17/2017  . Malignant neoplasm of upper-outer quadrant of left breast in female, estrogen receptor positive ([place]) 11/20/2016  . CKD (chronic kidney disease) stage 3, GFR 30-59 ml/min (HCC) 03/17/2016  . 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 Seasonal allergies 12/01/2013  . TOBACCO USE 04/13/2008  . Hyperlipidemia associated with  type 2 diabetes mellitus ([place]) 11/29/2006  . Morbid obesity ([place]) 11/29/2006  . Essential hypertension 11/29/2006    Past Surgical History:  Procedure Laterality Date  . AXILLARY LYMPH NODE DISSECTION Left 08/05/2019   Procedure: LEFT AXILLARY LYMPH NODE DISSECTION;  Surgeon: Erroll Luna, MD;  Location: [facility];  Service: General;  Laterality: Left;  . BREAST LUMPECTOMY Left 05/05/2008  . BREAST LUMPECTOMY Left 12/07/2016   malignant  . BREAST LUMPECTOMY WITH RADIOACTIVE SEED AND SENTINEL LYMPH NODE BIOPSY Left 12/07/2016   Procedure: LEFT BREAST LUMPECTOMY WITH RADIOACTIVE SEED AND SENTINEL LYMPH NODE BIOPSY;  Surgeon: Erroll Luna, MD;  Location: [facility];  Service: General;  Laterality: Left;  . IR FLUORO GUIDE PORT INSERTION RIGHT  01/04/2017  . IR THORACENTESIS ASP PLEURAL SPACE W/IMG GUIDE  03/02/2020  . IR US GUIDE VASC ACCESS RIGHT  01/04/2017  . PORT-A-CATH REMOVAL N/A 05/30/2017   Procedure: REMOVAL PORT-A-CATH;  Surgeon: Erroll Luna, MD;  Location: [facility];  Service: General;  Laterality: N/A;  . RE-EXCISION OF BREAST LUMPECTOMY Left 12/28/2016   Procedure: RE-EXCISION OF BREAST LUMPECTOMY;  Surgeon: Erroll Luna, MD;  Location: [facility];  Service: General;  Laterality: Left;     OB History   No obstetric history on file.     Family History  Problem Relation Age of Onset  . Diabetes Sister        x 4  . Heart failure Sister   . Diabetes Brother   . Heart disease Brother   . Diabetes Paternal Grandmother   . Diabetes Paternal Aunt   . Heart attack Brother   . Arthritis Sister   . Hypertension Sister   . Diabetes Sister   . Emphysema Brother   . Cancer Father 67       brain tumor   . Diabetes Child   . Asthma Child   . Hypertension Child   . Diabetes Child   . Diabetes Child   . Colon cancer Neg Hx   . Stomach cancer Neg Hx     Social History   Tobacco Use  . Smoking status: Never Smoker  . Smokeless  tobacco: Former Systems developer    Types: Snuff  Vaping Use  . Vaping Use: Never used  Substance Use Topics  . Alcohol use: No    Alcohol/week: 0.0 standard drinks  . Drug use: No    Home Medications Prior to Admission medications   Medication Sig Start Date End Date Taking? Authorizing Provider  albuterol (VENTOLIN HFA) 108 (90 Base) MCG/ACT inhaler Inhale 2 puffs into the lungs every 6 (six) hours as needed for wheezing or shortness of breath. 10/21/19  Yes Marin Olp, MD  amLODipine (NORVASC) 10 MG tablet TAKE 1 TABLET BY MOUTH EVERY DAY Patient taking differently: Take 10 mg by mouth daily.  04/06/20  Yes Marin Olp, MD  busPIRone (BUSPAR) 10 MG tablet Take 1 tablet (10 mg total) by mouth 3 (three) times daily. 10/16/19  Yes Marin Olp, MD  carvedilol (COREG) 25  MG tablet Take 1 tablet (25 mg total) by mouth 2 (two) times daily with a meal. 10/07/19  Yes Marin Olp, MD  cloNIDine (CATAPRES) 0.3 MG tablet TAKE 1 TABLET (0.3 MG TOTAL) BY MOUTH 2 (TWO) TIMES DAILY. 02/16/20  Yes Marin Olp, MD  famotidine (PEPCID) 20 MG tablet TAKE 1 TABLET BY MOUTH EVERY DAY Patient taking differently: Take 20 mg by mouth daily.  03/09/20  Yes Mannam, Praveen, MD  fluticasone (FLONASE) 50 MCG/ACT nasal spray Place 1 spray into both nostrils daily. Patient taking differently: Place 1 spray into both nostrils daily as needed for allergies or rhinitis.  01/30/20  Yes Martyn Ehrich, NP  fluticasone furoate-vilanterol (BREO ELLIPTA) 100-25 MCG/INH AEPB Inhale 1 puff into the lungs daily. 03/22/20  Yes Mannam, Praveen, MD  furosemide (LASIX) 20 MG tablet Take 20 mg by mouth daily.   Yes [provider]  HYDROcodone-acetaminophen (NORCO/VICODIN) 5-325 MG tablet Take 1 tablet by mouth every 8 (eight) hours as needed for moderate pain. 08/20/19  Yes [name], MD  Insulin Glargine (LANTUS SOLOSTAR) 100 UNIT/ML Solostar Pen Inject 28 units into the skin in am Patient taking differently:  Inject 28-35 Units into the skin daily. If <240, inject 28 units; if >240, inject 35 units 08/11/19  Yes Philemon Kingdom, MD  insulin regular (NOVOLIN R) 100 units/mL injection Inject 0.14-0.18 mLs (14-18 Units total) into the skin 3 (three) times daily before meals. [place]. Please provide insulin syringes needed Patient taking differently: Inject 10 Units into the skin 3 (three) times daily before meals.  08/11/19  Yes Philemon Kingdom, MD  lisinopril-hydrochlorothiazide (ZESTORETIC) 20-12.5 MG tablet Take 1 tablet by mouth daily. 03/13/20  Yes [provider]  loperamide (IMODIUM A-D) 2 MG capsule Take 2 mg by mouth daily as needed for diarrhea or loose stools.   Yes [provider]  lovastatin (MEVACOR) 40 MG tablet TAKE 1 TABLET BY MOUTH EVERYDAY AT BEDTIME Patient taking differently: Take 40 mg by mouth at bedtime.  09/22/19  Yes Marin Olp, MD  montelukast (SINGULAIR) 10 MG tablet Take 1 tablet (10 mg total) by mouth at bedtime. 03/25/20  Yes Annita Brod, MD  ondansetron (ZOFRAN) 8 MG tablet Take 1 tablet (8 mg total) by mouth 2 (two) times daily as needed (Nausea or vomiting). 01/08/20  Yes [name], MD  polyethylene glycol powder (GLYCOLAX/MIRALAX) 17 GM/SCOOP powder Take 17 g by mouth daily. Patient taking differently: Take 17 g by mouth as needed for mild constipation or moderate constipation.  05/22/19  Yes Pyrtle, Lajuan Lines, MD  prochlorperazine (COMPAZINE) 10 MG tablet Take 1 tablet (10 mg total) by mouth every 6 (six) hours as needed for nausea or vomiting. 04/07/20  Yes Alla Feeling, NP  traMADol (ULTRAM) 50 MG tablet TAKE 1 TABLET BY MOUTH EVERY 6 HOURS AS NEEDED FOR MODERATE/SEVERE PAIN (NECK AND SHOULDER PAIN). Patient taking differently: Take 50 mg by mouth every 6 (six) hours as needed for moderate pain.  04/07/20  Yes Alla Feeling, NP  XELODA 500 MG tablet Take 3 tablets (1,500 mg total) by mouth 2 (two) times daily after a meal. Take for 14 days, then  hold for 7 days. Repeat every 21 days. 04/07/20  Yes [name], MD  amoxicillin-clavulanate (AUGMENTIN) 875-125 MG tablet Take 1 tablet by mouth every 12 (twelve) hours. Patient not taking: Reported on 04/08/2020 03/25/20   Annita Brod, MD  B-D INS SYRINGE 0.5CC/31GX5/16 31G X 5/16" 0.5 ML MISC  USE AS DIRECTED 4 TIMES A DAY 08/18/16   Philemon Kingdom, MD  Insulin Pen Needle 31G X 5 MM MISC Use pen needles for insulin injection daily 12/12/16   Philemon Kingdom, MD  Insulin Syringe-Needle U-100 (B-D INS SYRINGE 0.5CC/30GX1/2") 30G X 1/2" 0.5 ML MISC USE AS DIRECTED 4 TIMES A DAY 08/15/16   Philemon Kingdom, MD    Allergies    Patient has no known allergies.  Review of Systems   Review of Systems  All other systems reviewed and are negative.   Physical Exam Updated Vital Signs BP 124/75   Pulse 65   Temp 97.6 F (36.4 C)   Resp 11   Ht 5' 2"  (1.575 m)   Wt 94.8 kg   LMP  (LMP Unknown)   SpO2 100%   BMI 38.23 kg/m   Physical Exam Vitals and nursing note reviewed. Exam conducted with a chaperone present.  Constitutional:      Appearance: She is ill-appearing.     Comments: In Retail banker.  HENT:     Head: Normocephalic and atraumatic.  Eyes:     General: No scleral icterus.    Extraocular Movements: Extraocular movements intact.     Conjunctiva/sclera: Conjunctivae normal.     Pupils: Pupils are equal, round, and reactive to light.  Cardiovascular:     Rate and Rhythm: Normal rate and regular rhythm.     Pulses: Normal pulses.     Heart sounds: Normal heart sounds.  Pulmonary:     Comments: Mildly increased work of breathing.  Diminished breath sounds in lung bases bilaterally. Abdominal:     General: Abdomen is flat. There is no distension.     Palpations: Abdomen is soft.     Tenderness: There is no abdominal tenderness.  Musculoskeletal:     Cervical back: Normal range of motion.     Right lower leg: No edema.     Left lower leg: No edema.  Skin:     General: Skin is dry.     Capillary Refill: Capillary refill takes less than 2 seconds.  Neurological:     General: No focal deficit present.     Mental Status: She is alert and oriented to person, place, and time.     GCS: GCS eye subscore is 4. GCS verbal subscore is 5. GCS motor subscore is 6.  Psychiatric:        Mood and Affect: Mood normal.        Behavior: Behavior normal.        Thought Content: Thought content normal.     ED Results / Procedures / Treatments   Labs (all labs ordered are listed, but only abnormal results are displayed) Labs Reviewed  BASIC METABOLIC PANEL - Abnormal; Notable for the following components:      Result Value   Chloride 96 (*)    Glucose, Bld 278 (*)    Creatinine, Ser 1.91 (*)    Calcium 8.7 (*)    GFR calc non Af Amer 25 (*)    GFR calc Af Amer 29 (*)    All other components within normal limits  CBC WITH DIFFERENTIAL/PLATELET - Abnormal; Notable for the following components:   RBC 3.05 (*)    Hemoglobin 9.1 (*)    HCT 27.3 (*)    RDW 16.7 (*)    Lymphs Abs 0.5 (*)    All other components within normal limits  URINALYSIS, ROUTINE W REFLEX MICROSCOPIC - Abnormal; Notable for the following  components:   Glucose, UA 50 (*)    Hgb urine dipstick SMALL (*)    Protein, ur >=300 (*)    All other components within normal limits  TSH - Abnormal; Notable for the following components:   TSH 9.457 (*)    All other components within normal limits  CBG MONITORING, ED - Abnormal; Notable for the following components:   Glucose-Capillary 272 (*)    All other components within normal limits  CULTURE, BLOOD (ROUTINE X 2)  CULTURE, BLOOD (ROUTINE X 2)  URINE CULTURE  MRSA PCR SCREENING  LACTIC ACID, PLASMA  LACTIC ACID, PLASMA  TROPONIN I (HIGH SENSITIVITY)  TROPONIN I (HIGH SENSITIVITY)    EKG EKG Interpretation  Date/Time:  Thursday April 08 2020 13:24:52 EDT Ventricular Rate:  61 PR Interval:    QRS Duration: 92 QT  Interval:  429 QTC Calculation: 433 R Axis:   82 Text Interpretation: Sinus rhythm Borderline right axis deviation Low voltage, precordial leads Confirmed by Dewaine Conger [phone]) on 04/08/2020 1:31:48 PM   Radiology DG Chest 2 View  Result Date: 04/08/2020 CLINICAL DATA:  Shortness of breath EXAM: CHEST - 2 VIEW COMPARISON:  03/22/2020 radiograph, CT 03/22/2020 FINDINGS: Moderate right greater than left bilateral pleural effusions, increased compared to prior. Worsening airspace disease at both lung bases. Enlarged cardiomediastinal silhouette with vascular congestion and increased pulmonary edema. Pulmonary metastatic nodules not well appreciated likely due to diffuse bilateral airspace disease. No pneumothorax. IMPRESSION: 1. Moderate right greater than left bilateral pleural effusions, increased compared to prior with worsening airspace disease at both lung bases. 2. Cardiomegaly with vascular congestion and increased pulmonary edema. Electronically Signed   By: Donavan Foil M.D.   On: 04/08/2020 15:32    Procedures Procedures (including critical care time)  Medications Ordered in ED Medications  ceFEPIme (MAXIPIME) 2 g in sodium chloride 0.9 % 100 mL IVPB (has no administration in time range)  vancomycin (VANCOREADY) IVPB 1500 mg/300 mL (has no administration in time range)  methylPREDNISolone sodium succinate (SOLU-MEDROL) 125 mg/2 mL injection 125 mg (125 mg Intravenous Given 04/08/20 1326)    ED Course  I have reviewed the triage vital signs and the nursing notes.  Pertinent labs & imaging results that were available during my care of the patient were reviewed by me and considered in my medical decision making (see chart for details).  Clinical Course as of Apr 08 1746  Thu Apr 08, 2020  1744 Spoke with Dr. Dwyane Dee who will see and admit patient for her shortness of breath and hypoxia at home in context of her worsening pneumonia and pleural effusions.   [GG]    Clinical Course User  Index [GG] Corena Herter, PA-C   MDM Rules/Calculators/A&P                          Given patient's report that she was wheezing at home and had shortness of breath symptoms was mildly improved with Breo, provide patient with 125 mg of Solu-Medrol IV here in the ED.  On reexamination, patient was feeling improved.  Her supplemental O2 requirements had improved from 10 L via NRB to 4 L via [place], her typical at home baseline requirement.  Her temperature has slowly improved while in the [facility], we will eventually temperature temperature to see if she once again becomes hypothermic.  While she initially met SIRS criteria, her vital signs of improved and she is no longer tachypneic.  Labs  CBC: Anemic with hemoglobin at 9.1, but improved from labs obtained yesterday.  Patient is denying any melena. BMP: Worsening renal function.  Hyperglycemia to 278.  Normal anion gap and CO2. Troponin: 12 >> CBG: Obtained given patient's hypothermia, elevated to 272. TSH: High at 9.457. New.  EKG: NSR. Relatively unremarkable.  Patient's daughter-in-law who is there with her today is a Therapist, sports here at [facility] on [employer] and is the one who told me that she was oxygenating in the 50s on her typical 4 L supplemental O2 via [place] at home and was also having increased confusion that she suspected to be related to her hypoxia.  That has since improved significantly daughter believes that she is back at baseline now in terms of her cognition.  However, given imaging findings of worsening airspace disease and pleural effusions bilaterally, in conjunction with patient's hypoxia at home, feel as though she should be admitted for observation and possibly Pleurx catheter placement in the setting of her bilateral pleural effusions and shortness of breath symptoms.  According to daughter, last hospitalist Dr. [place] Pink was hoping that patient could have Pleurx catheter placed given her worsening pleural effusions.  They attempted on 6/23, but  was unable to be placed.  Given worsening airspace disease on plain films, consulted with pharmacy who recommended cefepime and vancomycin antibiotics.  Will also obtain MRSA PCR.  Plan is for admission in context of her worsening pneumonia and pleural effusions and hypoxia on regular at-home supplemental oxygen.  Dr. Ron Parker evaluated patient and agrees with assessment and plan.  Spoke with Dr. Dwyane Dee who will see and admit patient for her shortness of breath and hypoxia at home in context of her worsening pneumonia and pleural effusions.  Final Clinical Impression(s) / ED Diagnoses Final diagnoses:  Shortness of breath    Rx / DC Orders ED Discharge Orders    None       Corena Herter, PA-C 04/08/20 1747    Breck Coons, MD 04/09/20 [phone]

## 2020-04-08 NOTE — Telephone Encounter (Signed)
See other open message.

## 2020-04-08 NOTE — Progress Notes (Signed)
Capecitabine (Xeloda) hold criteria  ANC < 1.5  Pltc < 100K  CrCl < 30 mL/min (contraindication)  SCr > 1.5x baseline (or >2 if baseline unknown)  Bilirubin > 1.5x ULN  Active infection  Acute coronary syndrome  Diarrhea - Grade 2 or higher  Hand / foot syndrome - Grade 2 or higher  Surgery planned this admission   Xeloda held for infection per P&T  policy.  Please resume once clinically appropriate.   Netta Cedars, PharmD, BCPS 04/08/2020@11 :10 PM

## 2020-04-08 NOTE — ED Notes (Signed)
Patient transported to X-ray 

## 2020-04-08 NOTE — Telephone Encounter (Signed)
Hey this number is not working do you know how to get in touch with them?

## 2020-04-08 NOTE — Telephone Encounter (Signed)
Scheduled per 7/7 los. Pt is aware of appt time and date. 

## 2020-04-08 NOTE — Progress Notes (Signed)
Pharmacy Antibiotic Note  Wendy [name] is a 79 y.o. female with a history of metastatic breast cancer recently admitted 6/21 for PNA and pleural effusion admitted on 04/08/2020 with shortness of breath.  Pharmacy has been consulted for vancomycin and cefepime dosing.  Plan: Cefepime 2 g iv q 24 hours. f Vancomycin 1500 mg iv once followed by 1000 mg iv q 48 hours.   F/U renal function, culture results, clinical course  MRSA PCR ordered  Height: 5\' 2"  (157.5 cm) Weight: 94.8 kg (209 lb) IBW/kg (Calculated) : 50.1  Temp (24hrs), Avg:96.4 F (35.8 C), Min:93.1 F (33.9 C), Max:97.9 F (36.6 C)  Recent Labs  Lab 04/07/20 1103 04/08/20 1325 04/08/20 1532  WBC 7.8  --  8.4  CREATININE 1.56*  --  1.91*  LATICACIDVEN  --  1.4 1.3    Estimated Creatinine Clearance: 26.1 mL/min (A) (by C-G formula based on SCr of 1.91 mg/dL (H)).    No Known Allergies  Antimicrobials this admission: 7/8 cefepime >>  7/8 vancomycin >>   Dose adjustments this admission:   Microbiology results: 7/8 BCx: sent 7/8 UCx: sent   7/8 MRSA PCR:   Thank you for allowing pharmacy to be a part of this patient's care.  Napoleon Form 04/08/2020 7:16 PM

## 2020-04-08 NOTE — ED Notes (Signed)
Attempted to call report, bed not approved per staff.

## 2020-04-08 NOTE — Telephone Encounter (Signed)
Filled by dr Burr Medico

## 2020-04-08 NOTE — Telephone Encounter (Signed)
Disregard, you have already filled this rx.

## 2020-04-08 NOTE — ED Notes (Addendum)
Spoke with lab about the results of patient's BMP, CBC, and troponin.  Per lab, should result at anytime.  Donna Christen, [place] updated.

## 2020-04-08 NOTE — H&P (Signed)
History and Physical    Wendy [name] FTD:[id number] DOB: Apr 20, 1941 DOA: 04/08/2020  PCP: Marin Olp, MD   Patient coming from:  Home.  I have personally briefly reviewed patient's old medical records in [place]  Chief Complaint: Acute shortness of breath.  HPI: Wendy [name] is a 79 y.o. female with medical history significant of metastatic breast cancer on chemotherapy, hypertension, stage III CKD, type 2 diabetes, chronic anemia, major depression, anxiety, tobacco use, presents in the emergency department via EMS for shortness of breath.  Patient was recently hospitalized on March 22, 2020 for community-acquired pneumonia.  Patient uses oxygen at baseline at 4 L via nasal cannula saturating 92%.  Today morning she was not feeling well, appeared confused and has significant difficulty breathing.  Her O2 saturation was 50% on 4L which improved to 70% when her oxygen was increased to 6 L.  After EMS arrived, patient was found hypothermic,  she was placed on 10 L oxygen via nonrebreather.  Daughter reports that she appeared to be confused and suspect this could be because of her hypoxia.  Daughter denies any fever, cough, nausea, vomiting, history of clots, leg swelling, recent travel, sick contacts.  ED Course: Patient was hypothermic on arrival, hypoxic on nonrebreather,  Vitals:  Temp 93.4 which improved to 97.6 F after placed on bare hugger. HR: 69, RR 14, BP 124/75, O2 sat 94% on 4L/ m improved with IV solumedrol.  Labs: CBC: WBC 8.4, hemoglobin 9.1, hematocrit 27.3, MCV 89.5, platelet 174, sodium 140, potassium 4.4, chloride 96, bicarb 32, glucose 278, BUN 18, creatinine 1.91, calcium 8.7, troponin 12, lactic acid 1.3, UA glucose 50 hemoglobin small protein 300. CXR: Moderate right greater than left bilateral pleural effusions, increased compared to prior with worsening airspace disease at both lung bases. Cardiomegaly with vascular congestion and increased  pulmonary edema.  Review of Systems: As per HPI otherwise 10 point review of systems negative.  Review of Systems  Constitutional: Negative.   HENT: Negative.   Eyes: Negative.   Respiratory: Positive for shortness of breath.   Cardiovascular: Negative.   Gastrointestinal: Negative.   Genitourinary: Negative.   Musculoskeletal: Negative.   Neurological: Negative.   Psychiatric/Behavioral: Negative.     Past Medical History:  Diagnosis Date   Abnormal breast finding 2018   per pt/ having  a lot drainage from left breast nipple   Anemia    Anxiety    Breast cancer ([place]) 11/2016   left/   CKD (chronic kidney disease)    Depression    GERD (gastroesophageal reflux disease)    TUMS as needed   HTN (hypertension)    states BP has been high recently; has been on med. x 20 yr.   Hyperlipidemia    Hyperplastic colon polyp    HYPERTENSION, BENIGN SYSTEMIC 11/29/2006   Lisinopril hctz 10-12.5mg , amlodipine 10mg , coreg 25mg  BID, clonidine 0.3 mg BID.    Insulin dependent diabetes mellitus    Personal history of chemotherapy    2018/finished 6 weeks of chemo in Sep 2018   Personal history of radiation therapy    2018 left breast/finished radiation in Sept 2018 per pt.    Past Surgical History:  Procedure Laterality Date   AXILLARY LYMPH NODE DISSECTION Left 08/05/2019   Procedure: LEFT AXILLARY LYMPH NODE DISSECTION;  Surgeon: Erroll Luna, MD;  Location: [place];  Service: General;  Laterality: Left;   BREAST LUMPECTOMY Left 05/05/2008   BREAST LUMPECTOMY Left 12/07/2016  malignant   BREAST LUMPECTOMY WITH RADIOACTIVE SEED AND SENTINEL LYMPH NODE BIOPSY Left 12/07/2016   Procedure: LEFT BREAST LUMPECTOMY WITH RADIOACTIVE SEED AND SENTINEL LYMPH NODE BIOPSY;  Surgeon: Erroll Luna, MD;  Location: [place];  Service: General;  Laterality: Left;   IR FLUORO GUIDE PORT INSERTION RIGHT  01/04/2017   IR THORACENTESIS ASP PLEURAL  SPACE W/IMG GUIDE  03/02/2020   IR US GUIDE VASC ACCESS RIGHT  01/04/2017   PORT-A-CATH REMOVAL N/A 05/30/2017   Procedure: REMOVAL PORT-A-CATH;  Surgeon: Erroll Luna, MD;  Location: [place];  Service: General;  Laterality: N/A;   RE-EXCISION OF BREAST LUMPECTOMY Left 12/28/2016   Procedure: RE-EXCISION OF BREAST LUMPECTOMY;  Surgeon: Erroll Luna, MD;  Location: [place];  Service: General;  Laterality: Left;     reports that she has never smoked. She quit smokeless tobacco use about 2 years ago.  Her smokeless tobacco use included snuff. She reports that she does not drink alcohol and does not use drugs.  No Known Allergies  Family History  Problem Relation Age of Onset   Diabetes Sister        x 4   Heart failure Sister    Diabetes Brother    Heart disease Brother    Diabetes Paternal Grandmother    Diabetes Paternal Aunt    Heart attack Brother    Arthritis Sister    Hypertension Sister    Diabetes Sister    Emphysema Brother    Cancer Father 104       brain tumor    Diabetes Child    Asthma Child    Hypertension Child    Diabetes Child    Diabetes Child    Colon cancer Neg Hx    Stomach cancer Neg Hx      Family history reviewed and not pertinent   Prior to Admission medications   Medication Sig Start Date End Date Taking? Authorizing Provider  albuterol (VENTOLIN HFA) 108 (90 Base) MCG/ACT inhaler Inhale 2 puffs into the lungs every 6 (six) hours as needed for wheezing or shortness of breath. 10/21/19  Yes Marin Olp, MD  amLODipine (NORVASC) 10 MG tablet TAKE 1 TABLET BY MOUTH EVERY DAY Patient taking differently: Take 10 mg by mouth daily.  04/06/20  Yes Marin Olp, MD  busPIRone (BUSPAR) 10 MG tablet Take 1 tablet (10 mg total) by mouth 3 (three) times daily. 10/16/19  Yes Marin Olp, MD  carvedilol (COREG) 25 MG tablet Take 1 tablet (25 mg total) by mouth 2 (two) times daily with a meal. 10/07/19  Yes Marin Olp, MD  cloNIDine (CATAPRES) 0.3 MG tablet TAKE 1 TABLET (0.3 MG TOTAL) BY MOUTH 2 (TWO) TIMES DAILY. 02/16/20  Yes Marin Olp, MD  famotidine (PEPCID) 20 MG tablet TAKE 1 TABLET BY MOUTH EVERY DAY Patient taking differently: Take 20 mg by mouth daily.  03/09/20  Yes Mannam, Praveen, MD  fluticasone (FLONASE) 50 MCG/ACT nasal spray Place 1 spray into both nostrils daily. Patient taking differently: Place 1 spray into both nostrils daily as needed for allergies or rhinitis.  01/30/20  Yes Martyn Ehrich, NP  fluticasone furoate-vilanterol (BREO ELLIPTA) 100-25 MCG/INH AEPB Inhale 1 puff into the lungs daily. 03/22/20  Yes Mannam, Praveen, MD  furosemide (LASIX) 20 MG tablet Take 20 mg by mouth daily.   Yes [provider]  HYDROcodone-acetaminophen (NORCO/VICODIN) 5-325 MG tablet Take 1 tablet by mouth every 8 (eight) hours  as needed for moderate pain. 08/20/19  Yes [name], MD  Insulin Glargine (LANTUS SOLOSTAR) 100 UNIT/ML Solostar Pen Inject 28 units into the skin in am Patient taking differently: Inject 28-35 Units into the skin daily. If <240, inject 28 units; if >240, inject 35 units 08/11/19  Yes Philemon Kingdom, MD  insulin regular (NOVOLIN R) 100 units/mL injection Inject 0.14-0.18 mLs (14-18 Units total) into the skin 3 (three) times daily before meals. [place]. Please provide insulin syringes needed Patient taking differently: Inject 10 Units into the skin 3 (three) times daily before meals.  08/11/19  Yes Philemon Kingdom, MD  lisinopril-hydrochlorothiazide (ZESTORETIC) 20-12.5 MG tablet Take 1 tablet by mouth daily. 03/13/20  Yes [provider]  loperamide (IMODIUM A-D) 2 MG capsule Take 2 mg by mouth daily as needed for diarrhea or loose stools.   Yes [provider]  lovastatin (MEVACOR) 40 MG tablet TAKE 1 TABLET BY MOUTH EVERYDAY AT BEDTIME Patient taking differently: Take 40 mg by mouth at bedtime.  09/22/19  Yes Marin Olp, MD   montelukast (SINGULAIR) 10 MG tablet Take 1 tablet (10 mg total) by mouth at bedtime. 03/25/20  Yes Annita Brod, MD  ondansetron (ZOFRAN) 8 MG tablet Take 1 tablet (8 mg total) by mouth 2 (two) times daily as needed (Nausea or vomiting). 01/08/20  Yes [name], MD  polyethylene glycol powder (GLYCOLAX/MIRALAX) 17 GM/SCOOP powder Take 17 g by mouth daily. Patient taking differently: Take 17 g by mouth as needed for mild constipation or moderate constipation.  05/22/19  Yes Pyrtle, Lajuan Lines, MD  prochlorperazine (COMPAZINE) 10 MG tablet Take 1 tablet (10 mg total) by mouth every 6 (six) hours as needed for nausea or vomiting. 04/07/20  Yes Alla Feeling, NP  traMADol (ULTRAM) 50 MG tablet TAKE 1 TABLET BY MOUTH EVERY 6 HOURS AS NEEDED FOR MODERATE/SEVERE PAIN (NECK AND SHOULDER PAIN). Patient taking differently: Take 50 mg by mouth every 6 (six) hours as needed for moderate pain.  04/07/20  Yes Alla Feeling, NP  XELODA 500 MG tablet Take 3 tablets (1,500 mg total) by mouth 2 (two) times daily after a meal. Take for 14 days, then hold for 7 days. Repeat every 21 days. 04/07/20  Yes [name], MD  amoxicillin-clavulanate (AUGMENTIN) 875-125 MG tablet Take 1 tablet by mouth every 12 (twelve) hours. Patient not taking: Reported on 04/08/2020 03/25/20   Annita Brod, MD  B-D INS SYRINGE 0.5CC/31GX5/16 31G X 5/16" 0.5 ML MISC USE AS DIRECTED 4 TIMES A DAY 08/18/16   Philemon Kingdom, MD  Insulin Pen Needle 31G X 5 MM MISC Use pen needles for insulin injection daily 12/12/16   Philemon Kingdom, MD  Insulin Syringe-Needle U-100 (B-D INS SYRINGE 0.5CC/30GX1/2") 30G X 1/2" 0.5 ML MISC USE AS DIRECTED 4 TIMES A DAY 08/15/16   Philemon Kingdom, MD    Physical Exam: Vitals:   04/08/20 1630 04/08/20 1631 04/08/20 1700 04/08/20 1707  BP: 130/73  124/75   Pulse: 67  65   Resp: (!) 22  11   Temp: (!) 97 F (36.1 C) (!) 97 F (36.1 C) (!) 97.5 F (36.4 C) 97.6 F (36.4 C)  TempSrc:      SpO2: 100%   100%   Weight:      Height:        Constitutional: NAD, calm, comfortable Vitals:   04/08/20 1630 04/08/20 1631 04/08/20 1700 04/08/20 1707  BP: 130/73  124/75   Pulse: 67  65   Resp: (!) 22  11   Temp: (!) 97 F (36.1 C) (!) 97 F (36.1 C) (!) 97.5 F (36.4 C) 97.6 F (36.4 C)  TempSrc:      SpO2: 100%  100%   Weight:      Height:       Eyes: PERRL, lids and conjunctivae normal ENMT: Mucous membranes are moist. Posterior pharynx clear of any exudate or lesions.Normal dentition.  Neck: normal, supple, no masses, no thyromegaly Respiratory: Bilateral crackles, no wheezing, no crackles. Normal respiratory effort. No accessory muscle use.  Cardiovascular: Regular rate and rhythm, no murmurs / rubs / gallops. No extremity edema. 2+ pedal pulses. No carotid bruits.  Abdomen: no tenderness, no masses palpated. No hepatosplenomegaly. Bowel sounds positive.  Musculoskeletal: no clubbing / cyanosis. No joint deformity upper and lower extremities. Good ROM, no contractures. Normal muscle tone.  Skin: no rashes, lesions, ulcers. No induration Neurologic: CN 2-12 grossly intact. Sensation intact, DTR normal. Strength 5/5 in all 4.  Psychiatric: Normal judgment and insight. Alert and oriented x 3. Normal mood.     Labs on Admission: I have personally reviewed following labs and imaging studies  CBC: Recent Labs  Lab 04/07/20 1103 04/08/20 1532  WBC 7.8 8.4  NEUTROABS 6.0 7.5  HGB 8.6* 9.1*  HCT 25.8* 27.3*  MCV 90.8 89.5  PLT 149* 518   Basic Metabolic Panel: Recent Labs  Lab 04/07/20 1103 04/08/20 1532  NA 142 140  K 4.2 4.4  CL 99 96*  CO2 36* 32  GLUCOSE 141* 278*  BUN 11 18  CREATININE 1.56* 1.91*  CALCIUM 9.1 8.7*   GFR: Estimated Creatinine Clearance: 26.1 mL/min (A) (by C-G formula based on SCr of 1.91 mg/dL (H)). Liver Function Tests: Recent Labs  Lab 04/07/20 1103  AST 11*  ALT 6  ALKPHOS 121  BILITOT 0.5  PROT 6.4*  ALBUMIN 3.2*   No results for  input(s): LIPASE, AMYLASE in the last 168 hours. No results for input(s): AMMONIA in the last 168 hours. Coagulation Profile: No results for input(s): INR, PROTIME in the last 168 hours. Cardiac Enzymes: No results for input(s): CKTOTAL, CKMB, CKMBINDEX, TROPONINI in the last 168 hours. BNP (last 3 results) Recent Labs    09/15/19 1552  PROBNP CANCELED   HbA1C: No results for input(s): HGBA1C in the last 72 hours. CBG: Recent Labs  Lab 04/08/20 1427  GLUCAP 272*   Lipid Profile: No results for input(s): CHOL, HDL, LDLCALC, TRIG, CHOLHDL, LDLDIRECT in the last 72 hours. Thyroid Function Tests: Recent Labs    04/08/20 1325  TSH 9.457*   Anemia Panel: No results for input(s): VITAMINB12, FOLATE, FERRITIN, TIBC, IRON, RETICCTPCT in the last 72 hours. Urine analysis:    Component Value Date/Time   COLORURINE YELLOW 04/08/2020 1353   APPEARANCEUR CLEAR 04/08/2020 1353   LABSPEC 1.014 04/08/2020 1353   PHURINE 7.0 04/08/2020 1353   GLUCOSEU 50 (A) 04/08/2020 1353   HGBUR SMALL (A) 04/08/2020 1353   BILIRUBINUR NEGATIVE 04/08/2020 1353   BILIRUBINUR Negative 10/07/2019 1121   KETONESUR NEGATIVE 04/08/2020 1353   PROTEINUR >=300 (A) 04/08/2020 1353   UROBILINOGEN 0.2 10/07/2019 1121   NITRITE NEGATIVE 04/08/2020 1353   LEUKOCYTESUR NEGATIVE 04/08/2020 1353    Radiological Exams on Admission: DG Chest 2 View  Result Date: 04/08/2020 CLINICAL DATA:  Shortness of breath EXAM: CHEST - 2 VIEW COMPARISON:  03/22/2020 radiograph, CT 03/22/2020 FINDINGS: Moderate right greater than left bilateral pleural effusions, increased compared to  prior. Worsening airspace disease at both lung bases. Enlarged cardiomediastinal silhouette with vascular congestion and increased pulmonary edema. Pulmonary metastatic nodules not well appreciated likely due to diffuse bilateral airspace disease. No pneumothorax. IMPRESSION: 1. Moderate right greater than left bilateral pleural effusions, increased  compared to prior with worsening airspace disease at both lung bases. 2. Cardiomegaly with vascular congestion and increased pulmonary edema. Electronically Signed   By: Donavan Foil M.D.   On: 04/08/2020 15:32    EKG: Independently reviewed.  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 includes COPD , CHF exacerbation, recurrent pleural effusion, HCA pneumonia: O2 saturation on arrival 70% on 6 L, placed on non rebreather briefly. Continue supplemental oxygen to maintain saturation above 94%. Her breathing has significantly improved after she received Solu-Medrol 125 mg once. She is weaned down to 4 L supplemental oxygen saturating 93%. Chest x-ray shows findings consistent with pulmonary edema, moderate right pleural effusion. Daughter reports on previous hospitalization,  she was told she might need Pleurx catheter. Admit to telemetry for close observation. Continue DuoNeb, Solu-Medrol 40 every 12 hr. Pulmonology consulted,  will follow up recommendation. Continue vancomycin and cefepime for HCAP.  Recent hospitalization last week. Pharmacy consulted for Vanco and cefepime dosing. Follow blood and urine cultures. Obtain proBNP, procalcitonin level, Covid PCR. Echo 6/21: Left ventricular EF 55 to 60%   Bilateral pleural effusion: Daughter reports previous hospitalization she was  told she might need Pleurx catheter She appears comfortable after getting Solu-Medrol and breathing treatment. Consider Pleurx catheter or thoracocentesis by IR  History of metastatic breast cancer: Continue chemotherapy. Consider oncology consult  Acute kidney injury on CKD stage III: Creatinine 1.9, baseline creatinine 1.4 Avoid nephrotoxic medications, Recheck labs tomorrow morning   Essential hypertension: Continue clonidine, amlodipine, hold lisinopril HCTZ  Diabetes mellitus type 2: Continue Lantus, regular insulin sliding scale Obtain hemoglobin A1c  Chronic anemia: Could be secondary to breast ca and chemotherapy.  DVT prophylaxis:  Lovenox Code Status: DNR Family Communication:  Disposition Plan: Skilled nursing facility, versus Palliative care Consults called: Pulmonology Admission status: Inpatient   Shawna Clamp MD [facility]   If 7PM-7AM, please contact night-coverage [email or web address]   04/08/2020, 6:21 PM

## 2020-04-08 NOTE — Telephone Encounter (Signed)
That is the only number I got from them. I'm sorry!

## 2020-04-08 NOTE — ED Triage Notes (Signed)
Patient arrived by EMS from home. Patient c/o SOB. Patient saturation was 50% on 4L at home. EMS placed patient on non-rebreather.   Patient is normally on 4L [place] at home.   Patient has breast cancer.

## 2020-04-08 NOTE — Progress Notes (Signed)
A consult was received from an ED physician for vancomycin per pharmacy dosing.  The patient's profile has been reviewed for ht/wt/allergies/indication/available labs.   A one time order has been placed for vancomycin 1500 mg.  Further antibiotics/pharmacy consults should be ordered by admitting physician if indicated.                       Thank you, Napoleon Form 04/08/2020  5:03 PM

## 2020-04-09 DIAGNOSIS — J9601 Acute respiratory failure with hypoxia: Secondary | ICD-10-CM

## 2020-04-09 DIAGNOSIS — J9602 Acute respiratory failure with hypercapnia: Secondary | ICD-10-CM

## 2020-04-09 LAB — GLUCOSE, RANDOM: Glucose, Bld: 458 mg/dL — ABNORMAL HIGH (ref 70–99)

## 2020-04-09 LAB — URINE CULTURE: Culture: NO GROWTH

## 2020-04-09 LAB — CREATININE, SERUM
Creatinine, Ser: 2.03 mg/dL — ABNORMAL HIGH (ref 0.44–1.00)
GFR calc Af Amer: 27 mL/min — ABNORMAL LOW (ref >60)
GFR calc non Af Amer: 23 mL/min — ABNORMAL LOW (ref >60)

## 2020-04-09 LAB — GLUCOSE, CAPILLARY
Glucose-Capillary: 307 mg/dL — ABNORMAL HIGH (ref 70–99)
Glucose-Capillary: 275 mg/dL — ABNORMAL HIGH (ref 70–99)
Glucose-Capillary: 387 mg/dL — ABNORMAL HIGH (ref 70–99)
Glucose-Capillary: 409 mg/dL — ABNORMAL HIGH (ref 70–99)
Glucose-Capillary: 496 mg/dL — ABNORMAL HIGH (ref 70–99)

## 2020-04-09 LAB — CBC
HCT: 23.5 % — ABNORMAL LOW (ref 36.0–46.0)
Hemoglobin: 8 g/dL — ABNORMAL LOW (ref 12.0–15.0)
MCH: 30.4 pg (ref 26.0–34.0)
MCHC: 34 g/dL (ref 30.0–36.0)
MCV: 89.4 fL (ref 80.0–100.0)
Platelets: 160 10*3/uL (ref 150–400)
RBC: 2.63 MIL/uL — ABNORMAL LOW (ref 3.87–5.11)
RDW: 16.4 % — ABNORMAL HIGH (ref 11.5–15.5)
WBC: 8.3 10*3/uL (ref 4.0–10.5)
nRBC: 0.2 % (ref 0.0–0.2)

## 2020-04-09 LAB — MRSA PCR SCREENING: MRSA by PCR: NEGATIVE

## 2020-04-09 LAB — PROCALCITONIN: Procalcitonin: 0.19 ng/mL

## 2020-04-09 MED ORDER — INSULIN ASPART 100 UNIT/ML ~~LOC~~ SOLN
2.0000 [IU] | Freq: Once | SUBCUTANEOUS | Status: AC
  Administered 2020-04-09: 2 [IU] via SUBCUTANEOUS

## 2020-04-09 MED ORDER — INSULIN ASPART 100 UNIT/ML ~~LOC~~ SOLN
5.0000 [IU] | Freq: Once | SUBCUTANEOUS | Status: AC
  Administered 2020-04-09: 5 [IU] via SUBCUTANEOUS

## 2020-04-09 NOTE — Progress Notes (Signed)
Wendy [name]   DOB:07-Sep-1941   EY#:[id number]   DJS#:[id number]  Oncology follow up   Subjective: Patient is well-known to me, under my care for her metastatic breast cancer.  She was admitted for worsening dyspnea, likely secondary to her pleural effusion.  She feels better today. She has not had thoracentesis since admission, plan to have Pleurx placement by IR on Monday.  Objective:  Vitals:   04/09/20 1359 04/09/20 1944  BP:  (!) 142/66  Pulse:  81  Resp:  19  Temp:  (!) 97.3 F (36.3 C)  SpO2: 99% 98%    Body mass index is 38.23 kg/m.  Intake/Output Summary (Last 24 hours) at 04/09/2020 2008 Last data filed at 04/09/2020 1700 Gross per 24 hour  Intake 240 ml  Output 475 ml  Net -235 ml     Sclerae unicteric  Oropharynx clear  No peripheral adenopathy  Lungs clear, decreased breath sounds on bases, R>L  Heart regular rate and rhythm  Abdomen benign    CBG (last 3)  Recent Labs    04/09/20 1159 04/09/20 1742 04/09/20 1943  GLUCAP 387* 409* 496*     Labs:  Urine Studies No results for input(s): UHGB, CRYS in the last 72 hours.  Invalid input(s): UACOL, UAPR, USPG, UPH, UTP, UGL, UKET, UBIL, UNIT, UROB, ULEU, UEPI, UWBC, URBC, UBAC, CAST, [place], [place]  Basic Metabolic Panel: Recent Labs  Lab 04/07/20 1103 04/08/20 1532 04/09/20 0445 04/09/20 1811  NA 142 140  --   --   K 4.2 4.4  --   --   CL 99 96*  --   --   CO2 36* 32  --   --   GLUCOSE 141* 278*  --  458*  BUN 11 18  --   --   CREATININE 1.56* 1.91* 2.03*  --   CALCIUM 9.1 8.7*  --   --    GFR Estimated Creatinine Clearance: 24.5 mL/min (A) (by C-G formula based on SCr of 2.03 mg/dL (H)). Liver Function Tests: Recent Labs  Lab 04/07/20 1103  AST 11*  ALT 6  ALKPHOS 121  BILITOT 0.5  PROT 6.4*  ALBUMIN 3.2*   No results for input(s): LIPASE, AMYLASE in the last 168 hours. No results for input(s): AMMONIA in the last 168 hours. Coagulation profile No results for input(s): INR, PROTIME in  the last 168 hours.  CBC: Recent Labs  Lab 04/07/20 1103 04/08/20 1532 04/09/20 0445  WBC 7.8 8.4 8.3  NEUTROABS 6.0 7.5  --   HGB 8.6* 9.1* 8.0*  HCT 25.8* 27.3* 23.5*  MCV 90.8 89.5 89.4  PLT 149* 174 160   Cardiac Enzymes: No results for input(s): CKTOTAL, CKMB, CKMBINDEX, TROPONINI in the last 168 hours. BNP: Invalid input(s): POCBNP CBG: Recent Labs  Lab 04/09/20 0151 04/09/20 0735 04/09/20 1159 04/09/20 1742 04/09/20 1943  GLUCAP 307* 275* 387* 409* 496*   D-Dimer No results for input(s): DDIMER in the last 72 hours. Hgb A1c No results for input(s): HGBA1C in the last 72 hours. Lipid Profile No results for input(s): CHOL, HDL, LDLCALC, TRIG, CHOLHDL, LDLDIRECT in the last 72 hours. Thyroid function studies Recent Labs    04/08/20 1325  TSH 9.457*   Anemia work up No results for input(s): VITAMINB12, FOLATE, FERRITIN, TIBC, IRON, RETICCTPCT in the last 72 hours. Microbiology Recent Results (from the past 240 hour(s))  Blood culture (routine x 2)     Status: None (Preliminary result)   Collection Time: 04/08/20 12:58  PM   Specimen: BLOOD  Result Value Ref Range Status   Specimen Description   Final    BLOOD RIGHT ARM Performed at [facility] [address], [place], [place] [address]    Special Requests   Final    BOTTLES DRAWN AEROBIC AND ANAEROBIC Blood Culture adequate volume Performed at [facility] [address], [place], [place] [address]    Culture   Final    NO GROWTH < 24 HOURS Performed at [facility] [address] [place], [place] [address]    Report Status PENDING  Incomplete  Urine culture     Status: None   Collection Time: 04/08/20  1:53 PM   Specimen: Urine, Catheterized  Result Value Ref Range Status   Specimen Description   Final    URINE, CATHETERIZED Performed at [facility] [address], [place], [place] [address]    Special Requests   Final     NONE Performed at [facility], [place] [address] [place], [place] [address]    Culture   Final    NO GROWTH Performed at [facility], [place] [address], [place], [place] [address]    Report Status 04/09/2020 FINAL  Final  SARS Coronavirus 2 by RT PCR (hospital order, performed in [facility] hospital lab) Nasopharyngeal Nasopharyngeal Swab     Status: None   Collection Time: 04/08/20  6:27 PM   Specimen: Nasopharyngeal Swab  Result Value Ref Range Status   SARS Coronavirus 2 NEGATIVE NEGATIVE Final    Comment: (NOTE) SARS-CoV-2 target nucleic acids are NOT DETECTED.  The SARS-CoV-2 RNA is generally detectable in upper and lower respiratory specimens during the acute phase of infection. The lowest concentration of SARS-CoV-2 viral copies this assay can detect is 250 copies / mL. A negative result does not preclude SARS-CoV-2 infection and should not be used as the sole basis for treatment or other patient management decisions.  A negative result may occur with improper specimen collection / handling, submission of specimen other than nasopharyngeal swab, presence of viral mutation(s) within the areas targeted by this assay, and inadequate number of viral copies (<250 copies / mL). A negative result must be combined with clinical observations, patient history, and epidemiological information.  Fact Sheet for Patients:   StrictlyIdeas.no  Fact Sheet for Healthcare Providers: BankingDealers.co.za  This test is not yet approved or  cleared by the [place] FDA and has been authorized for detection and/or diagnosis of SARS-CoV-2 by FDA under an Emergency Use Authorization (EUA).  This EUA will remain in effect (meaning this test can be used) for the duration of the COVID-19 declaration under Section 564(b)(1) of the Act, 21 U.S.C. section 360bbb-3(b)(1), unless the authorization is terminated or revoked  sooner.  Performed at [facility], [place] [address], [place], [place] [address]   Blood culture (routine x 2)     Status: None (Preliminary result)   Collection Time: 04/08/20  9:04 PM   Specimen: BLOOD RIGHT HAND  Result Value Ref Range Status   Specimen Description   Final    BLOOD RIGHT HAND Performed at [facility] [address] [place], [place] [address]    Special Requests   Final    BOTTLES DRAWN AEROBIC ONLY Blood Culture adequate volume Performed at [facility] [address], [place],  [address]    Culture   Final    NO  GROWTH < 24 HOURS Performed at [facility], [place] [address], [place], [place] [address]    Report Status PENDING  Incomplete  MRSA PCR Screening     Status: None   Collection Time: 04/09/20  9:29 AM   Specimen: Nasal Mucosa; Nasopharyngeal  Result Value Ref Range Status   MRSA by PCR NEGATIVE NEGATIVE Final    Comment:        The GeneXpert MRSA Assay (FDA approved for NASAL specimens only), is one component of a comprehensive MRSA colonization surveillance program. It is not intended to diagnose MRSA infection nor to guide or monitor treatment for MRSA infections. Performed at [facility], [place] [address] [place],  [address]       Studies:  DG Chest 2 View  Result Date: 04/08/2020 CLINICAL DATA:  Shortness of breath EXAM: CHEST - 2 VIEW COMPARISON:  03/22/2020 radiograph, CT 03/22/2020 FINDINGS: Moderate right greater than left bilateral pleural effusions, increased compared to prior. Worsening airspace disease at both lung bases. Enlarged cardiomediastinal silhouette with vascular congestion and increased pulmonary edema. Pulmonary metastatic nodules not well appreciated likely due to diffuse bilateral airspace disease. No pneumothorax. IMPRESSION: 1. Moderate right greater than left bilateral pleural effusions, increased compared to prior with  worsening airspace disease at both lung bases. 2. Cardiomegaly with vascular congestion and increased pulmonary edema. Electronically Signed   By: Donavan Foil M.D.   On: 04/08/2020 15:32    Assessment: 79 y.o. AAF  1.  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  Bilateral malignant pleural effusion, R>L  3.  Metastatic breast cancer, on oral chemo Xeloda  4.  CKD 5. HTN,DM 6. Anemia secondary to cancer and chemo   Plan:  -she needs thoracentesis, agree with pleurx placement.  Her last thoracentesis was on June 17. It has not re-accumulate very quickly. If IR can do paracentesis tomorrow, okay to discharge after procedure, and I can arrange Pleurx as outpatient. It certainly will be reasonable to wait for Monday to get Pleurx done wo thoracentesis over the weekend -I told pt to hold Xeloda (suppose to start on Monday 7/12) until hospital discharge  -I will f/u as needed on Monday   [name]  04/09/2020    [name], MD 04/09/2020  8:08 PM

## 2020-04-09 NOTE — Progress Notes (Signed)
    Durable Medical Equipment  (From admission, onward)         Start     Ordered   04/09/20 1525  For home use only DME Hospital bed  Once       Question Answer Comment  Length of Need Lifetime   Patient has (list medical condition): Malignant pleueral effusion, Metastaic breast cancer.   The above medical condition requires: Patient requires the ability to reposition immediately   Bed type Semi-electric   [employer] fast streamer Yes   Trapeze Bar Yes   Support Surface: Low Air loss Mattress      04/09/20 1525

## 2020-04-09 NOTE — Evaluation (Addendum)
Physical Therapy Evaluation Patient Details Name: Wendy [name] MRN: [id number] DOB: April 21, 1941 Today's Date: 04/09/2020   History of Present Illness  Wendy [name] is a 79 y.o. female with medical history significant of metastatic breast cancer on chemotherapy, hypertension, stage III CKD, type 2 diabetes, chronic anemia, major depression, anxiety, tobacco use, presents in the emergency department via EMS for shortness of breath.  Clinical Impression  Pt admitted with above diagnosis.  Pt just back to bed with nursing staff and declines OOB again at this time. eval limited d/t pt dyspnea/incr WOB after transfer. Pt has supportive family and necessary assist. Will benefit from continued HHPT at d/c. Will continue to follow in acute setting.  Pt currently with functional limitations due to the deficits listed below (see PT Problem List). Pt will benefit from skilled PT to increase their independence and safety with mobility to allow discharge to the venue listed below.       Follow Up Recommendations Home health PT;Supervision for mobility/OOB    Equipment Recommendations  None recommended by PT    Recommendations for Other Services       Precautions / Restrictions Precautions Precautions: Fall Precaution Comments: O2 dependent at baseline, monitor O2 Restrictions Weight Bearing Restrictions: No      Mobility  Bed Mobility Overal bed mobility: Modified Independent Bed Mobility: Supine to Sit     Supine to sit: Min guard;HOB elevated     General bed mobility comments: pt just returned to bed with nursing staff, OOB with OT earlier. pt with 3/4 DOE and declines OOB at this time; was mod I with OT however requires +2 max assist to scoot up in bed from supine position  Transfers Overall transfer level: Needs assistance Equipment used: Rolling walker (2 wheeled) Transfers: Sit to/from Stand Sit to Stand: Min guard         General transfer comment: pt  declines  Ambulation/Gait                Stairs            Wheelchair Mobility    Modified Rankin (Stroke Patients Only)       Balance Overall balance assessment: Mild deficits observed, not formally tested                                           Pertinent Vitals/Pain Pain Assessment: No/denies pain Faces Pain Scale: Hurts a little bit Pain Location: bilateral lower legs Pain Descriptors / Indicators: Discomfort Pain Intervention(s): Monitored during session    Home Living Family/patient expects to be discharged to:: Private residence Living Arrangements: Children Available Help at Discharge: Family;Available 24 hours/day Type of Home: House       Home Layout: One level Home Equipment: Shower seat;Hand held shower head;Grab bars - tub/shower;Bedside commode;Walker - 2 wheels;Other (comment)      Prior Function Level of Independence: Needs assistance   Gait / Transfers Assistance Needed: uses RW, at times with assistance with walking  ADL's / Homemaking Assistance Needed: son assists to step over edge of tub, sometimes needs help with clothing and walking to the bathroom.        Hand Dominance   Dominant Hand: Right    Extremity/Trunk Assessment   Upper Extremity Assessment Upper Extremity Assessment: Defer to OT evaluation    Lower Extremity Assessment Lower Extremity Assessment:  (mild edema bil LEs)  Cervical / Trunk Assessment Cervical / Trunk Assessment: Kyphotic  Communication   Communication: No difficulties  Cognition Arousal/Alertness: Awake/alert Behavior During Therapy: WFL for tasks assessed/performed Overall Cognitive Status: Within Functional Limits for tasks assessed                                        General Comments      Exercises General Exercises - Lower Extremity Ankle Circles/Pumps: AROM;Both;10 reps Quad Sets: AROM;Both;5 reps Heel Slides: AROM;Both;5 reps    Assessment/Plan    PT Assessment Patient needs continued PT services  PT Problem List Decreased strength;Decreased mobility;Decreased activity tolerance;Decreased balance;Decreased knowledge of use of DME;Cardiopulmonary status limiting activity       PT Treatment Interventions DME instruction;Therapeutic activities;Therapeutic exercise;Gait training;Patient/family education;Balance training;Functional mobility training    PT Goals (Current goals can be found in the Care Plan section)  Acute Rehab PT Goals Patient Stated Goal: breathe better PT Goal Formulation: With patient Time For Goal Achievement: 04/23/20 Potential to Achieve Goals: Fair    Frequency Min 3X/week   Barriers to discharge        Co-evaluation               AM-PAC PT "6 Clicks" Mobility  Outcome Measure Help needed turning from your back to your side while in a flat bed without using bedrails?: A Little Help needed moving from lying on your back to sitting on the side of a flat bed without using bedrails?: A Little Help needed moving to and from a bed to a chair (including a wheelchair)?: A Little Help needed standing up from a chair using your arms (e.g., wheelchair or bedside chair)?: A Little Help needed to walk in hospital room?: A Little Help needed climbing 3-5 steps with a railing? : A Little 6 Click Score: 18    End of Session Equipment Utilized During Treatment: Oxygen Activity Tolerance: Patient limited by fatigue Patient left: in bed;with call bell/phone within reach;with bed alarm set;with family/visitor present   PT Visit Diagnosis: Muscle weakness (generalized) (M62.81);Other abnormalities of gait and mobility (R26.89)    Time: 3976-7341 PT Time Calculation (min) (ACUTE ONLY): 21 min   Charges:   PT Evaluation $PT Eval Low Complexity: 1 Low          Breaker Springer, PT  Acute Rehab Dept ([place]) [phone] Pager [phone]  04/09/2020   [facility] 04/09/2020, 1:55 PM

## 2020-04-09 NOTE — Consult Note (Signed)
NAME:  Wendy [name], MRN:  [id number], DOB:  [birth year], LOS: 1 ADMISSION DATE:  04/08/2020, CONSULTATION DATE:  04/09/2020 REFERRING MD:  Dwyane Dee, CHIEF COMPLAINT:  Malignant pleural effusion   Brief History   Is being evaluated for worsening shortness of breath Known to have malignant pleural effusion from breast cancer She is currently on chemotherapy She was recently hospitalized for shortness of breath, found to have the pleural effusion but did not have significant effusion at the time to have a Pleurx catheter placed She was discharged home and has been doing well for the last many days but got short of breath the last 2 days She continues on oxygen supplementation EMS activated secondary to shortness of breath, confusion  She has not had any fevers, no increasing cough, no vomiting  Past Medical History   Past Medical History:  Diagnosis Date  . Abnormal breast finding 2018   per pt/ having  a lot drainage from left breast nipple  . Anemia   . Anxiety   . Breast cancer ([place]) 11/2016   left/  . CKD (chronic kidney disease)   . Depression   . GERD (gastroesophageal reflux disease)    TUMS as needed  . HTN (hypertension)    states BP has been high recently; has been on med. x 20 yr.  . Hyperlipidemia   . Hyperplastic colon polyp   . HYPERTENSION, BENIGN SYSTEMIC 11/29/2006   Lisinopril hctz 10-12.5mg , amlodipine 10mg , coreg 25mg  BID, clonidine 0.3 mg BID.   Marland Kitchen Insulin dependent diabetes mellitus   . Personal history of chemotherapy    2018/finished 6 weeks of chemo in Sep 2018  . Personal history of radiation therapy    2018 left breast/finished radiation in Sept 2018 per pt.   Significant Hospital Events   Feels slightly better  Consults:  PCCM  Procedures:  None  Significant Diagnostic Tests:  Chest x-ray 04/08/2020 -Bilateral pleural effusion worse on the right  Micro Data:  Blood culture 7/8 Urine culture 7/8 MRSA PCR negative  Antimicrobials:  Cefepime  7/8>> Vancomycin 7/8>>  Interim history/subjective:  Feels a little bit better since being in the hospital  Objective   Blood pressure (!) 149/62, pulse 89, temperature 98 F (36.7 C), temperature source Oral, resp. rate 17, height 5\' 2"  (1.575 m), weight 94.8 kg, SpO2 100 %.        Intake/Output Summary (Last 24 hours) at 04/09/2020 1127 Last data filed at 04/09/2020 1000 Gross per 24 hour  Intake 220.01 ml  Output 275 ml  Net -54.99 ml   Filed Weights   04/08/20 1227  Weight: 94.8 kg    Examination: General: Elderly lady, does not appear to be in acute distress HENT: Moist oral mucosa Lungs: Decreased air entry at the bases bilaterally Cardiovascular: S1-S2 appreciated Abdomen: Bowel sounds appreciated  Recent CT scan reviewed by myself Recent chest x-ray reviewed by myself  Resolved Hospital Problem list     Assessment & Plan:  Metastatic breast cancer with malignant pleural effusion -She is on chemotherapy  Hypoxemic respiratory failure secondary to atelectasis and pleural effusion  Underlying chronic obstructive pulmonary disease -Continue bronchodilators -Continue steroids-transition to oral steroids  Congestive heart failure  I do not believe there is any infection involved at present -We will discontinue antibiotics  Consult interventional radiology for Pleurx catheter placement -Placed  Acute kidney injury on chronic kidney disease stage III -Avoid nephrotoxic's -Trend electrolytes  Other problems include hypertension-continue home medications Type 2 diabetes-continue home medications  and insulin Chronic anemia   IR consult for Pleurx catheter placement  Sherrilyn Rist, MD [facility] PCCM Pager: [phone]

## 2020-04-09 NOTE — Progress Notes (Signed)
PROGRESS NOTE    Wendy [name]  EHM:[id number] DOB: June 23, 1941 DOA: 04/08/2020   PCP: Marin Olp, MD   Brief Narrative:  Wendy [name] is a 79 y.o. female with medical history significant of metastatic breast cancer on chemotherapy, hypertension, stage III CKD, type 2 diabetes, chronic anemia, major depression, anxiety, tobacco use, presents in the emergency department via EMS for shortness of breath.  Patient was recently hospitalized on March 22, 2020 for community-acquired pneumonia.  Patient uses oxygen at baseline at 4 L via nasal cannula saturating 92%.  She was found confused and had significant difficulty breathing at the facility. Her O2 saturation was 50% on 4L which improved to 70% when her oxygen was increased to 6 L.  After EMS arrived, patient was found hypothermic,  she was placed on 10 L oxygen via nonrebreather.  Daughter reports that she appeared to be confused and suspect this could be because of her hypoxia.  Daughter denies any fever, cough, nausea, vomiting, history of clots, leg swelling, recent travel, sick contacts. CXR: showed Moderate right  bilateral pleural effusions. During previous hospitalization she was found to have the pleural effusion but did not have significant effusion at the time of discharge to have a Pleurx catheter placed.  Plan: Pleurx catheter by IR 7/10 , discharge back to skilled nursing facility.  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 improving  Diff Dx. includes COPD , CHF exacerbation, malignant pleural effusion, HCA pneumonia: O2 saturation on arrival 70% on 6 L, placed on non rebreather briefly. Continue supplemental oxygen to maintain saturation above 94%. Her breathing has significantly improved after she received Solu-Medrol 125 mg once. She is weaned down to 4 L supplemental oxygen saturating 93%. Chest x-ray shows findings consistent with pulmonary edema, moderate right pleural effusion. Daughter reports on previous hospitalization,  she was told she might need Pleurx catheter.  She did not have significant effusion at the time of discharge to have a Pleurx catheter placed. Admit to telemetry for close observation. Continue DuoNeb, Solu-Medrol 40 every 12 hr. Pulmonology consulted, recommended  DC antibiotics, needs Pluerix catheter Follow blood and urine cultures. proBNP, procalcitonin level normal  , Covid PCR. Echo 6/21: Left ventricular EF 55 to 60%  Bilateral pleural effusion: Daughter reports previous hospitalization she was told she might need Pleurx catheter She appears comfortable after getting Solu-Medrol and breathing treatment. Plan: Pleurx catheter by IR 7/10.   History of metastatic breast cancer: Continue chemotherapy. Outpatient oncology follow up.  Acute kidney injury on CKD stage III: Creatinine 1.9, baseline creatinine 1.4 Avoid nephrotoxic medications, Recheck labs tomorrow morning  Essential hypertension: Continue clonidine, amlodipine, hold lisinopril HCTZ  Diabetes mellitus type 2: Continue Lantus, regular insulin sliding scale Obtain hemoglobin A1c  Chronic anemia: Could be secondary to breast ca and chemotherapy.  DVT prophylaxis:  Lovenox Code Status: DNR Family Communication:  Discussed with daughter [name] Disposition Plan: Skilled nursing facility, versus Palliative care Consults called:  Pulmonology  Consultants:   Pulmonology, IR  Procedures:  Needs pluerix catheter.  Antimicrobials:  Anti-infectives (From admission, onward)   Start     Dose/Rate Route Frequency Ordered Stop   04/10/20 1900  vancomycin (VANCOCIN) IVPB 1000 mg/200 mL premix  Status:  Discontinued        1,000 mg 200 mL/hr over 60 Minutes Intravenous Every 48 hours 04/08/20 1947 04/09/20 1301   04/09/20 1800  ceFEPIme (MAXIPIME) 2 g in sodium chloride 0.9 % 100 mL IVPB  Status:  Discontinued        2 g 200 mL/hr over 30 Minutes Intravenous Every 24 hours 04/08/20 1947 04/09/20 1301   04/08/20 1830  vancomycin (VANCOREADY) IVPB 1500 mg/300 mL        1,500 mg 150 mL/hr over 120 Minutes Intravenous  Once 04/08/20 1700 04/08/20 2030   04/08/20 1700  ceFEPIme (MAXIPIME) 2 g in sodium chloride 0.9 % 100 mL IVPB        2 g 200 mL/hr over 30 Minutes Intravenous  Once 04/08/20 1657 04/08/20 1825      Subjective: Patient is seen and examined at bedside.  She appears much comfortable, alert,  Oriented and  interactive.  Denies any difficulty breathing.     Objective: Vitals:   04/09/20 0931 04/09/20 1132 04/09/20 1207 04/09/20 1359  BP: (!) 149/62  (!) 120/56   Pulse: 89  76   Resp: 17  18   Temp: 98 F (36.7 C)  98.6 F (37 C)   TempSrc: Oral  Oral   SpO2: 100% 97% 99% 99%  Weight:      Height:        Intake/Output Summary (Last 24 hours) at 04/09/2020 1526 Last data filed at 04/09/2020 1411 Gross per 24 hour  Intake 340.01 ml  Output 275 ml  Net 65.01 ml   Filed Weights   04/08/20 1227  Weight: 94.8 kg    Examination:  General exam: Appears calm and comfortable  Respiratory system: Clear to auscultation. Respiratory effort normal. Cardiovascular system: S1 & S2 heard, RRR. No JVD, murmurs, rubs, gallops or clicks. No pedal edema. Gastrointestinal system: Abdomen is nondistended, soft and nontender. No organomegaly or masses felt. Normal bowel sounds heard. Central nervous system:  Alert and oriented. No focal neurological deficits. Extremities: No edema, no swelling, no cyanosis Skin: No rashes, lesions or ulcers Psychiatry: Judgement and insight appear normal. Mood & affect appropriate.     Data Reviewed: I have personally reviewed following labs and imaging studies  CBC: Recent Labs  Lab 04/07/20 1103 04/08/20 1532 04/09/20 0445  WBC 7.8 8.4 8.3  NEUTROABS 6.0 7.5  --   HGB 8.6* 9.1* 8.0*  HCT 25.8* 27.3* 23.5*  MCV 90.8 89.5 89.4  PLT 149* 174 606   Basic Metabolic Panel: Recent Labs  Lab 04/07/20 1103 04/08/20 1532 04/09/20 0445  NA 142 140  --   K 4.2 4.4  --   CL 99 96*  --   CO2 36* 32  --   GLUCOSE 141* 278*  --   BUN 11 18  --   CREATININE 1.56* 1.91* 2.03*  CALCIUM 9.1 8.7*  --    GFR: Estimated Creatinine Clearance: 24.5 mL/min (A) (by C-G formula based on SCr of 2.03 mg/dL (H)). Liver Function Tests: Recent Labs  Lab 04/07/20 1103  AST 11*  ALT 6  ALKPHOS 121  BILITOT 0.5  PROT 6.4*  ALBUMIN 3.2*   No results for input(s): LIPASE, AMYLASE in the last  168 hours. No results for input(s): AMMONIA in the last 168 hours. Coagulation Profile: No results for input(s): INR, PROTIME in the last 168 hours. Cardiac Enzymes: No results for input(s): CKTOTAL, CKMB, CKMBINDEX, TROPONINI in the last 168 hours. BNP (last 3 results) Recent Labs    09/15/19 1552  PROBNP CANCELED   HbA1C: No results for input(s): HGBA1C in the last 72 hours. CBG: Recent Labs  Lab 04/08/20 1427 04/08/20 2154 04/09/20 0151 04/09/20 0735 04/09/20 1159  GLUCAP 272* 318* 307* 275* 387*   Lipid Profile: No results for input(s): CHOL, HDL, LDLCALC, TRIG, CHOLHDL, LDLDIRECT in the last 72 hours. Thyroid Function Tests: Recent Labs    04/08/20 1325  TSH 9.457*   Anemia Panel: No results for input(s): VITAMINB12, FOLATE, FERRITIN, TIBC, IRON, RETICCTPCT in the last 72 hours. Sepsis Labs: Recent Labs  Lab 04/08/20 1325 04/08/20 1532  04/08/20 1939 04/09/20 0445  PROCALCITON  --   --  0.14 0.19  LATICACIDVEN 1.4 1.3  --   --     Recent Results (from the past 240 hour(s))  Blood culture (routine x 2)     Status: None (Preliminary result)   Collection Time: 04/08/20 12:58 PM   Specimen: BLOOD  Result Value Ref Range Status   Specimen Description   Final    BLOOD RIGHT ARM Performed at [facility] [address], [place], [place] [address]    Special Requests   Final    BOTTLES DRAWN AEROBIC AND ANAEROBIC Blood Culture adequate volume Performed at [facility] [address] [place], [place] [address]    Culture   Final    NO GROWTH < 24 HOURS Performed at [facility] [address] [place], [place] [address]    Report Status PENDING  Incomplete  Urine culture     Status: None   Collection Time: 04/08/20  1:53 PM   Specimen: Urine, Catheterized  Result Value Ref Range Status   Specimen Description   Final    URINE, CATHETERIZED Performed at [facility] [address] [place], [place] [address]    Special Requests   Final    NONE Performed at [facility], [place] [address], [place], [place] [address]    Culture   Final    NO GROWTH Performed at [facility], [place] [address], [place], [place] [address]    Report Status 04/09/2020 FINAL  Final  SARS Coronavirus 2 by RT PCR (hospital order, performed in [facility] hospital lab) Nasopharyngeal Nasopharyngeal Swab     Status: None   Collection Time: 04/08/20  6:27 PM   Specimen: Nasopharyngeal Swab  Result Value Ref Range Status   SARS Coronavirus 2 NEGATIVE NEGATIVE Final    Comment: (NOTE) SARS-CoV-2 target nucleic acids are NOT DETECTED.  The SARS-CoV-2 RNA is generally detectable in upper and lower respiratory specimens during the acute phase of infection. The lowest concentration of SARS-CoV-2 viral copies this assay can detect is 250 copies / mL. A  negative result does not preclude SARS-CoV-2 infection and should not be used as the sole basis for treatment or other patient management decisions.  A negative result may occur with improper specimen collection / handling, submission of specimen other than nasopharyngeal swab, presence of viral mutation(s) within the areas targeted by this assay, and inadequate number of viral copies (<250 copies / mL). A negative result must be combined with clinical observations, patient history, and epidemiological information.  Fact Sheet for Patients:   StrictlyIdeas.no  Fact Sheet for Healthcare Providers: BankingDealers.co.za  This test is not yet approved or  cleared by the [place] FDA and has been authorized for detection and/or diagnosis of SARS-CoV-2 by FDA under an Emergency Use Authorization (EUA).  This EUA will remain in effect (meaning this test can be used) for the duration of the COVID-19 declaration under Section 564(b)(1) of the Act, 21 U.S.C. section 360bbb-3(b)(1), unless the authorization is terminated or revoked sooner.  Performed at [facility], [place] [address] [place], [place] [address]   Blood culture (routine x 2)     Status: None (Preliminary result)   Collection Time: 04/08/20  9:04 PM   Specimen: BLOOD RIGHT HAND  Result Value Ref Range Status   Specimen Description   Final    BLOOD RIGHT HAND Performed at [facility] [address], [place], [place] [address]    Special Requests   Final    BOTTLES DRAWN AEROBIC ONLY Blood Culture adequate volume Performed at [facility] [address] [place], [place] [address]    Culture   Final    NO GROWTH < 24 HOURS Performed at [facility] [address] [place], [place] [address]    Report Status PENDING  Incomplete  MRSA PCR Screening     Status: None   Collection Time: 04/09/20  9:29 AM    Specimen: Nasal Mucosa; Nasopharyngeal  Result Value Ref Range Status   MRSA by PCR NEGATIVE NEGATIVE Final    Comment:        The GeneXpert MRSA Assay (FDA approved for NASAL specimens only), is one component of a comprehensive MRSA colonization surveillance program. It is not intended to diagnose MRSA infection nor to guide or monitor treatment for MRSA infections. Performed at [facility], [place] [address], [place], [place] [address]      Radiology Studies: DG Chest 2 View  Result Date: 04/08/2020 CLINICAL DATA:  Shortness of breath EXAM: CHEST - 2 VIEW COMPARISON:  03/22/2020 radiograph, CT 03/22/2020 FINDINGS: Moderate right greater than left bilateral pleural effusions, increased compared to prior. Worsening airspace disease at both lung bases. Enlarged cardiomediastinal silhouette with vascular congestion and increased pulmonary edema. Pulmonary metastatic nodules not well appreciated likely due to diffuse bilateral airspace disease. No pneumothorax. IMPRESSION: 1. Moderate right greater than left bilateral pleural effusions, increased compared to prior with worsening airspace disease at both lung bases. 2. Cardiomegaly with vascular congestion and increased pulmonary edema. Electronically Signed   By: Donavan Foil M.D.   On: 04/08/2020 15:32   Scheduled Meds: . albuterol  2.5 mg Nebulization Q6H  . amLODipine  10 mg Oral Daily  . busPIRone  10 mg Oral TID  . carvedilol  25 mg Oral BID WC  . cloNIDine  0.3 mg Oral BID  . docusate sodium  100 mg Oral BID  . enoxaparin (LOVENOX) injection  30 mg Subcutaneous Q24H  . famotidine  20 mg Oral Daily  . fluticasone furoate-vilanterol  1 puff Inhalation Daily  . furosemide  20 mg Oral Daily  . guaiFENesin  600 mg Oral BID  . insulin aspart  0-9 Units Subcutaneous TID WC  . insulin glargine  28 Units Subcutaneous QHS  . methylPREDNISolone (SOLU-MEDROL) injection  40 mg Intravenous BID  . montelukast  10 mg Oral QHS   . pravastatin  20 mg Oral q1800   Continuous Infusions:   LOS: 1  day    Time spent: 25 mins.    Shawna Clamp, MD [facility]   If 7PM-7AM, please contact night-coverage

## 2020-04-09 NOTE — Consult Note (Addendum)
Chief Complaint: Recurrent malignant pleural effusion  Referring Physician(s): Dr. Clearence Ped  Supervising Physician: Markus Daft  Patient Status: [facility] - In-pt  History of Present Illness: Wendy [name] is a 79 y.o. female 79 y.o, female inpatient. Smoker, history of HTN, DM, chronic anemia. 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 Patient on nasal cannula 4L. Patient was admitted for PNA in June 2021 and admitted this admission for hypoxemia and related AMS. Patient is familiar to the IR service having a thoracentesis performed on 6.17.21, 6.1.21 and 5.25.21 Chest xray performed on 7.8.21 shows bilateral pleural effusion with right being greater then the left. Team is requesting pleurex catheter placement.   Past Medical History:  Diagnosis Date  . Abnormal breast finding 2018   per pt/ having  a lot drainage from left breast nipple  . Anemia   . Anxiety   . Breast cancer ([place]) 11/2016   left/  . CKD (chronic kidney disease)   . Depression   . GERD (gastroesophageal reflux disease)    TUMS as needed  . HTN (hypertension)    states BP has been high recently; has been on med. x 20 yr.  . Hyperlipidemia   . Hyperplastic colon polyp   . HYPERTENSION, BENIGN SYSTEMIC 11/29/2006   Lisinopril hctz 10-12.5mg , amlodipine 10mg , coreg 25mg  BID, clonidine 0.3 mg BID.   Marland Kitchen Insulin dependent diabetes mellitus   . Personal history of chemotherapy    2018/finished 6 weeks of chemo in Sep 2018  . Personal history of radiation therapy    2018 left breast/finished radiation in Sept 2018 per pt.    Past Surgical History:  Procedure Laterality Date  . AXILLARY LYMPH NODE DISSECTION Left 08/05/2019   Procedure: LEFT AXILLARY LYMPH NODE DISSECTION;  Surgeon: Erroll Luna, MD;  Location: [place];  Service: General;  Laterality: Left;  . BREAST LUMPECTOMY Left 05/05/2008  . BREAST LUMPECTOMY Left 12/07/2016    malignant  . BREAST LUMPECTOMY WITH RADIOACTIVE SEED AND SENTINEL LYMPH NODE BIOPSY Left 12/07/2016   Procedure: LEFT BREAST LUMPECTOMY WITH RADIOACTIVE SEED AND SENTINEL LYMPH NODE BIOPSY;  Surgeon: Erroll Luna, MD;  Location: [place];  Service: General;  Laterality: Left;  . IR FLUORO GUIDE PORT INSERTION RIGHT  01/04/2017  . IR THORACENTESIS ASP PLEURAL SPACE W/IMG GUIDE  03/02/2020  . IR US GUIDE VASC ACCESS RIGHT  01/04/2017  . PORT-A-CATH REMOVAL N/A 05/30/2017   Procedure: REMOVAL PORT-A-CATH;  Surgeon: Erroll Luna, MD;  Location: [place];  Service: General;  Laterality: N/A;  . RE-EXCISION OF BREAST LUMPECTOMY Left 12/28/2016   Procedure: RE-EXCISION OF BREAST LUMPECTOMY;  Surgeon: Erroll Luna, MD;  Location: [place];  Service: General;  Laterality: Left;    Allergies: Patient has no known allergies.  Medications: Prior to Admission medications   Medication Sig Start Date End Date Taking? Authorizing Provider  albuterol (VENTOLIN HFA) 108 (90 Base) MCG/ACT inhaler Inhale 2 puffs into the lungs every 6 (six) hours as needed for wheezing or shortness of breath. 10/21/19  Yes Marin Olp, MD  amLODipine (NORVASC) 10 MG tablet TAKE 1 TABLET BY MOUTH EVERY DAY Patient taking differently: Take 10 mg by mouth daily.  04/06/20  Yes Marin Olp, MD  busPIRone (BUSPAR) 10 MG tablet Take 1 tablet (10 mg total) by mouth 3 (three) times daily. 10/16/19  Yes Marin Olp, MD  carvedilol (COREG) 25 MG tablet Take 1  tablet (25 mg total) by mouth 2 (two) times daily with a meal. 10/07/19  Yes Marin Olp, MD  cloNIDine (CATAPRES) 0.3 MG tablet TAKE 1 TABLET (0.3 MG TOTAL) BY MOUTH 2 (TWO) TIMES DAILY. 02/16/20  Yes Marin Olp, MD  famotidine (PEPCID) 20 MG tablet TAKE 1 TABLET BY MOUTH EVERY DAY Patient taking differently: Take 20 mg by mouth daily.  03/09/20  Yes Mannam, Praveen, MD  fluticasone (FLONASE) 50 MCG/ACT nasal spray Place 1 spray  into both nostrils daily. Patient taking differently: Place 1 spray into both nostrils daily as needed for allergies or rhinitis.  01/30/20  Yes Martyn Ehrich, NP  fluticasone furoate-vilanterol (BREO ELLIPTA) 100-25 MCG/INH AEPB Inhale 1 puff into the lungs daily. 03/22/20  Yes Mannam, Praveen, MD  furosemide (LASIX) 20 MG tablet Take 20 mg by mouth daily.   Yes [provider]  HYDROcodone-acetaminophen (NORCO/VICODIN) 5-325 MG tablet Take 1 tablet by mouth every 8 (eight) hours as needed for moderate pain. 08/20/19  Yes [name], MD  Insulin Glargine (LANTUS SOLOSTAR) 100 UNIT/ML Solostar Pen Inject 28 units into the skin in am Patient taking differently: Inject 28-35 Units into the skin daily. If <240, inject 28 units; if >240, inject 35 units 08/11/19  Yes Philemon Kingdom, MD  insulin regular (NOVOLIN R) 100 units/mL injection Inject 0.14-0.18 mLs (14-18 Units total) into the skin 3 (three) times daily before meals. Relion. Please provide insulin syringes needed Patient taking differently: Inject 10 Units into the skin 3 (three) times daily before meals.  08/11/19  Yes Philemon Kingdom, MD  lisinopril-hydrochlorothiazide (ZESTORETIC) 20-12.5 MG tablet Take 1 tablet by mouth daily. 03/13/20  Yes [provider]  loperamide (IMODIUM A-D) 2 MG capsule Take 2 mg by mouth daily as needed for diarrhea or loose stools.   Yes [provider]  lovastatin (MEVACOR) 40 MG tablet TAKE 1 TABLET BY MOUTH EVERYDAY AT BEDTIME Patient taking differently: Take 40 mg by mouth at bedtime.  09/22/19  Yes Marin Olp, MD  montelukast (SINGULAIR) 10 MG tablet Take 1 tablet (10 mg total) by mouth at bedtime. 03/25/20  Yes Annita Brod, MD  ondansetron (ZOFRAN) 8 MG tablet Take 1 tablet (8 mg total) by mouth 2 (two) times daily as needed (Nausea or vomiting). 01/08/20  Yes [name], MD  polyethylene glycol powder (GLYCOLAX/MIRALAX) 17 GM/SCOOP powder Take 17 g by mouth  daily. Patient taking differently: Take 17 g by mouth as needed for mild constipation or moderate constipation.  05/22/19  Yes Pyrtle, Lajuan Lines, MD  prochlorperazine (COMPAZINE) 10 MG tablet Take 1 tablet (10 mg total) by mouth every 6 (six) hours as needed for nausea or vomiting. 04/07/20  Yes Alla Feeling, NP  traMADol (ULTRAM) 50 MG tablet TAKE 1 TABLET BY MOUTH EVERY 6 HOURS AS NEEDED FOR MODERATE/SEVERE PAIN (NECK AND SHOULDER PAIN). Patient taking differently: Take 50 mg by mouth every 6 (six) hours as needed for moderate pain.  04/07/20  Yes Alla Feeling, NP  XELODA 500 MG tablet Take 3 tablets (1,500 mg total) by mouth 2 (two) times daily after a meal. Take for 14 days, then hold for 7 days. Repeat every 21 days. 04/07/20  Yes [name], MD  amoxicillin-clavulanate (AUGMENTIN) 875-125 MG tablet Take 1 tablet by mouth every 12 (twelve) hours. Patient not taking: Reported on 04/08/2020 03/25/20   Annita Brod, MD  B-D INS SYRINGE 0.5CC/31GX5/16 31G X 5/16" 0.5 ML MISC USE AS DIRECTED 4  TIMES A DAY 08/18/16   Philemon Kingdom, MD  Insulin Pen Needle 31G X 5 MM MISC Use pen needles for insulin injection daily 12/12/16   Philemon Kingdom, MD  Insulin Syringe-Needle U-100 (B-D INS SYRINGE 0.5CC/30GX1/2") 30G X 1/2" 0.5 ML MISC USE AS DIRECTED 4 TIMES A DAY 08/15/16   Philemon Kingdom, MD     Family History  Problem Relation Age of Onset  . Diabetes Sister        x 4  . Heart failure Sister   . Diabetes Brother   . Heart disease Brother   . Diabetes Paternal Grandmother   . Diabetes Paternal Aunt   . Heart attack Brother   . Arthritis Sister   . Hypertension Sister   . Diabetes Sister   . Emphysema Brother   . Cancer Father 90       brain tumor   . Diabetes Child   . Asthma Child   . Hypertension Child   . Diabetes Child   . Diabetes Child   . Colon cancer Neg Hx   . Stomach cancer Neg Hx     Social History   Socioeconomic History  . Marital status: Widowed    Spouse name:  Not on file  . Number of children: 4  . Years of education: Not on file  . Highest education level: Not on file  Occupational History  . Occupation: DAY Armed forces operational officer: [employer]  Tobacco Use  . Smoking status: Never Smoker  . Smokeless tobacco: Former Systems developer    Types: Snuff  Vaping Use  . Vaping Use: Never used  Substance and Sexual Activity  . Alcohol use: No    Alcohol/week: 0.0 standard drinks  . Drug use: No  . Sexual activity: Not Currently    Partners: Male  Other Topics Concern  . Not on file  Social History Narrative  . Not on file   Social Determinants of Health   Financial Resource Strain:   . Difficulty of Paying Living Expenses:   Food Insecurity:   . Worried About Charity fundraiser in the Last Year:   . Arboriculturist in the Last Year:   Transportation Needs:   . Film/video editor (Medical):   Marland Kitchen Lack of Transportation (Non-Medical):   Physical Activity:   . Days of Exercise per Week:   . Minutes of Exercise per Session:   Stress:   . Feeling of Stress :   Social Connections:   . Frequency of Communication with Friends and Family:   . Frequency of Social Gatherings with Friends and Family:   . Attends Religious Services:   . Active Member of Clubs or Organizations:   . Attends Archivist Meetings:   Marland Kitchen Marital Status:      Review of Systems: A 12 point ROS discussed and pertinent positives are indicated in the HPI above.  All other systems are negative.  Review of Systems  Constitutional: Negative for fatigue and fever.  HENT: Negative for congestion.   Respiratory: Positive for cough ( non productive) and shortness of breath.   Gastrointestinal: Negative for abdominal pain, diarrhea, nausea and vomiting.    Vital Signs: BP (!) 120/56 (BP Location: Right Arm)   Pulse 76   Temp 98.6 F (37 C) (Oral)   Resp 18   Ht 5\' 2"  (1.575 m)   Wt 209 lb (94.8 kg)   LMP  (LMP Unknown)   SpO2 99%  BMI 38.23 kg/m    Physical Exam Vitals and nursing note reviewed.  Constitutional:      Appearance: She is well-developed.  HENT:     Head: Normocephalic and atraumatic.  Eyes:     Conjunctiva/sclera: Conjunctivae normal.  Pulmonary:     Effort: Pulmonary effort is normal.     Breath sounds: Examination of the right-lower field reveals decreased breath sounds. Examination of the left-lower field reveals decreased breath sounds. Decreased breath sounds present.     Comments: On Humidified O2 via nasal canula.  Musculoskeletal:     Cervical back: Normal range of motion.  Neurological:     Mental Status: She is alert and oriented to person, place, and time.     Imaging: DG Chest 1 View  Result Date: 03/18/2020 CLINICAL DATA:  79 year old female status post right thoracentesis. EXAM: CHEST  1 VIEW COMPARISON:  Chest x-ray 03/02/2020. FINDINGS: No pneumothorax. Small left pleural effusion. No definite right pleural effusion. Patchy multifocal interstitial and airspace disease noted throughout the mid to lower lungs bilaterally. No evidence of pulmonary edema. Heart size is normal. Upper mediastinal contours are within normal limits. Surgical clips in the left axillary region, likely from prior lymph node dissection. IMPRESSION: 1. No pneumothorax in this patient status post thoracentesis. 2. Small left pleural effusion. 3. Multilobar bilateral pneumonia throughout the mid to lower lungs bilaterally. Electronically Signed   By: Vinnie Langton M.D.   On: 03/18/2020 11:21   DG Chest 2 View  Result Date: 04/08/2020 CLINICAL DATA:  Shortness of breath EXAM: CHEST - 2 VIEW COMPARISON:  03/22/2020 radiograph, CT 03/22/2020 FINDINGS: Moderate right greater than left bilateral pleural effusions, increased compared to prior. Worsening airspace disease at both lung bases. Enlarged cardiomediastinal silhouette with vascular congestion and increased pulmonary edema. Pulmonary metastatic nodules not well appreciated  likely due to diffuse bilateral airspace disease. No pneumothorax. IMPRESSION: 1. Moderate right greater than left bilateral pleural effusions, increased compared to prior with worsening airspace disease at both lung bases. 2. Cardiomegaly with vascular congestion and increased pulmonary edema. Electronically Signed   By: Donavan Foil M.D.   On: 04/08/2020 15:32   CT Angio Chest PE W and/or Wo Contrast  Result Date: 03/22/2020 CLINICAL DATA:  Shortness of breath, history of metastatic breast cancer and pleural effusions EXAM: CT ANGIOGRAPHY CHEST WITH CONTRAST TECHNIQUE: Multidetector CT imaging of the chest was performed using the standard protocol during bolus administration of intravenous contrast. Multiplanar CT image reconstructions and MIPs were obtained to evaluate the vascular anatomy. CONTRAST:  8mL OMNIPAQUE IOHEXOL 350 MG/ML SOLN COMPARISON:  CT chest angiogram, 03/02/2020 FINDINGS: Cardiovascular: Satisfactory opacification of the pulmonary arteries to the segmental level. No evidence of pulmonary embolism. Normal heart size. Trace pericardial effusion. Mediastinum/Nodes: No enlarged mediastinal, hilar, or axillary lymph nodes. Thyroid gland, trachea, and esophagus demonstrate no significant findings. Lungs/Pleura: Moderate right, small left pleural effusions increased compared to prior CT dated 03/02/2020. Numerous bilateral pulmonary nodules. Upper Abdomen: No acute abnormality. Musculoskeletal: Soft tissue mass in the lateral left breast and axilla. No acute or significant osseous findings. Review of the MIP images confirms the above findings. IMPRESSION: 1. Negative examination for pulmonary embolism. 2. Moderate right, small left pleural effusions, increased compared to prior CT dated 03/02/2020. 3. Numerous bilateral pulmonary nodules, in keeping with metastatic breast malignancy. 4. Trace pericardial effusion, unchanged compared to prior examination. 5. Soft tissue mass in the lateral left  breast and axilla, in keeping with known breast malignancy. Electronically Signed  By: Eddie Candle M.D.   On: 03/22/2020 15:09   DG Chest Portable 1 View  Result Date: 03/22/2020 CLINICAL DATA:  Shortness of breath.  Metastatic breast carcinoma. EXAM: PORTABLE CHEST 1 VIEW COMPARISON:  03/18/2020 FINDINGS: Moderate cardiomegaly appears mildly increased since previous study. Increased diffuse interstitial infiltrates may be due to interstitial edema. New confluent opacity is seen in the right midlung, suspicious for pneumonia. Left lower lobe atelectasis or infiltrate shows no significant change. Probable small bilateral pleural effusions noted. Small pulmonary nodules are noted, but less well visualized than on prior exam. Multiple surgical clips again seen in the left axilla. IMPRESSION: 1. New confluent opacity in right midlung, suspicious for pneumonia. 2. Mild increase in cardiomegaly and diffuse interstitial infiltrates, suspicious for congestive heart failure. 3. Stable left lower lobe atelectasis versus infiltrate. Probable small bilateral pleural effusions. 4. Pulmonary metastases are less well visualized than on previous study. Electronically Signed   By: Marlaine Hind M.D.   On: 03/22/2020 12:03   ECHOCARDIOGRAM LIMITED  Result Date: 03/23/2020    ECHOCARDIOGRAM REPORT   Patient Name:   JAMEIKA KINN Date of Exam: 03/23/2020 Medical Rec #:  [id number]    Height:       62.0 in Accession #:    [id number]   Weight:       200.4 lb Date of Birth:  Apr 07, 1941    BSA:          1.913 m Patient Age:    74 years     BP:           163/101 mmHg Patient Gender: F            HR:           100 bpm. Exam Location:  Inpatient Procedure: Limited Echo, Cardiac Doppler and Color Doppler Indications:     Pericardial effusion 423.9 / I31.3  History:         Patient has prior history of Echocardiogram examinations, most                  recent 02/05/2020. Risk Factors:Diabetes, Hypertension and                  Dyslipidemia.  Multiple metastatic breast cancer, ,metastatic                  pleural effusion, GERD, Chronic kidney disease.  Sonographer:     Darlina Sicilian RDCS Referring Phys:  [id number] Lequita Halt Diagnosing Phys: Fransico Him MD IMPRESSIONS  1. Left ventricular ejection fraction, by estimation, is 55 to 60%. The left ventricle has normal function. Left ventricular endocardial border not optimally defined to evaluate regional wall motion. Left ventricular diastolic function could not be evaluated.  2. Right ventricular systolic function was not well visualized. The right ventricular size is not well visualized. There is moderately elevated pulmonary artery systolic pressure. The estimated right ventricular systolic pressure is 94.7 mmHg.  3. The mitral valve is normal in structure. No evidence of mitral valve regurgitation. No evidence of mitral stenosis.  4. The aortic valve is normal in structure. Aortic valve regurgitation is not visualized. No aortic stenosis is present.  5. The inferior vena cava is dilated in size with >50% respiratory variability, suggesting right atrial pressure of 8 mmHg.  6. A small pericardial effusion is present. The pericardial effusion is circumferential. There is no evidence of cardiac tamponade. FINDINGS  Left Ventricle: Left ventricular ejection fraction, by estimation, is 55  to 60%. The left ventricle has normal function. Left ventricular endocardial border not optimally defined to evaluate regional wall motion. The left ventricular internal cavity size was normal in size. There is no left ventricular hypertrophy. Left ventricular diastolic function could not be evaluated. Right Ventricle: The right ventricular size is not well visualized. Right vetricular wall thickness was not assessed. Right ventricular systolic function was not well visualized. There is moderately elevated pulmonary artery systolic pressure. The tricuspid regurgitant velocity is 2.85 m/s, and with an assumed right  atrial pressure of 8 mmHg, the estimated right ventricular systolic pressure is 41.7 mmHg. Left Atrium: Left atrial size was normal in size. Right Atrium: Right atrial size was normal in size. Pericardium: A small pericardial effusion is present. The pericardial effusion is circumferential. There is no evidence of cardiac tamponade. Mitral Valve: The mitral valve is normal in structure. There is mild calcification of the anterior mitral valve leaflet(s). Normal mobility of the mitral valve leaflets. Mild mitral annular calcification. No evidence of mitral valve regurgitation. No evidence of mitral valve stenosis. Tricuspid Valve: The tricuspid valve is normal in structure. Tricuspid valve regurgitation is mild . No evidence of tricuspid stenosis. Aortic Valve: The aortic valve is normal in structure. Aortic valve regurgitation is not visualized. No aortic stenosis is present. Pulmonic Valve: The pulmonic valve was normal in structure. Pulmonic valve regurgitation is not visualized. No evidence of pulmonic stenosis. Aorta: The aortic root is normal in size and structure. Venous: The inferior vena cava is dilated in size with greater than 50% respiratory variability, suggesting right atrial pressure of 8 mmHg. IAS/Shunts: No atrial level shunt detected by color flow Doppler.  LEFT VENTRICLE PLAX 2D LVIDd:         4.00 cm LVIDs:         2.90 cm LV PW:         1.00 cm LV IVS:        1.10 cm LVOT diam:     2.00 cm LVOT Area:     3.14 cm  LV Volumes (MOD) LV vol d, MOD A2C: 41.1 ml LV vol d, MOD A4C: 66.2 ml LV vol s, MOD A2C: 24.2 ml LV vol s, MOD A4C: 27.8 ml LV SV MOD A2C:     16.9 ml LV SV MOD A4C:     66.2 ml LV SV MOD BP:      28.0 ml LEFT ATRIUM         Index LA diam:    3.70 cm 1.93 cm/m   AORTA Ao Root diam: 3.20 cm TRICUSPID VALVE TR Peak grad:   32.5 mmHg TR Vmax:        285.00 cm/s  SHUNTS Systemic Diam: 2.00 cm Fransico Him MD Electronically signed by Fransico Him MD Signature Date/Time: 03/23/2020/3:43:48  PM    Final (Updated)    US Thoracentesis Asp Pleural space w/IMG guide  Result Date: 03/18/2020 INDICATION: Patient with history of metastatic breast cancer, dyspnea, recurrent right pleural effusion. Request made for therapeutic right thoracentesis. EXAM: ULTRASOUND GUIDED THERAPEUTIC RIGHT THORACENTESIS MEDICATIONS: None COMPLICATIONS: None immediate. PROCEDURE: An ultrasound guided thoracentesis was thoroughly discussed with the patient and questions answered. The benefits, risks, alternatives and complications were also discussed. The patient understands and wishes to proceed with the procedure. Written consent was obtained. Ultrasound was performed to localize and mark an adequate pocket of fluid in the right chest. The area was then prepped and draped in the normal sterile fashion. 1% Lidocaine was used  for local anesthesia. Under ultrasound guidance a 6 Fr Safe-T-Centesis catheter was introduced. Thoracentesis was performed. The catheter was removed and a dressing applied. FINDINGS: A total of approximately 400 cc of slightly hazy, yellow fluid was removed. IMPRESSION: Successful ultrasound guided therapeutic right thoracentesis yielding 400 cc of pleural fluid. Read by: Rowe Robert, PA-C Electronically Signed   By: Corrie Mckusick D.O.   On: 03/18/2020 10:53    Labs:  CBC: Recent Labs    03/23/20 0325 04/07/20 1103 04/08/20 1532 04/09/20 0445  WBC 7.4 7.8 8.4 8.3  HGB 10.8* 8.6* 9.1* 8.0*  HCT 31.9* 25.8* 27.3* 23.5*  PLT 321 149* 174 160    COAGS: No results for input(s): INR, APTT in the last 8760 hours.  BMP: Recent Labs    03/23/20 0325 03/23/20 0325 03/24/20 0226 04/07/20 1103 04/08/20 1532 04/09/20 0445  NA 136  --  136 142 140  --   K 4.6  --  4.4 4.2 4.4  --   CL 99  --  98 99 96*  --   CO2 25  --  28 36* 32  --   GLUCOSE 213*  --  185* 141* 278*  --   BUN 23  --  34* 11 18  --   CALCIUM 9.2  --  8.5* 9.1 8.7*  --   CREATININE 1.54*   < > 1.96* 1.56* 1.91*  2.03*  GFRNONAA 32*   < > 24* 31* 25* 23*  GFRAA 37*   < > 28* 37* 29* 27*   < > = values in this interval not displayed.    LIVER FUNCTION TESTS: Recent Labs    02/23/20 0809 02/23/20 0809 03/02/20 0932 03/08/20 0839 03/15/20 1512 04/07/20 1103  BILITOT 0.3  --   --  0.4 0.4 0.5  AST 12*  --   --  13* 16 11*  ALT 6  --   --  10 9 6   ALKPHOS 106  --   --  85 116 121  PROT 6.7   < > 6.7 5.5* 6.8 6.4*  ALBUMIN 3.3*  --   --  2.7* 2.9* 3.2*   < > = values in this interval not displayed.    Assessment and Plan:  79 y.o, female inpatient. Smoker, history of HTN, DM, chronic anemia. 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 Patient on nasal cannula 4L. Patient was admitted for PNA in June 2021 and admitted this admission for hypoxemia and related AMS. Patient is familiar to the IR service having a thoracentesis performed on 6.17.21, 6.1.21 and 5.25.21 Chest xray performed on 7.8.21 shows bilateral pleural effusion with right being greater then the left. Team is requesting pleurex catheter placement.   Cr 2.03, COVID test from this admission (7.8.21) is negative All labs and medications are within acceptable parameters.    IR consulted for possible pleurex catheter placement. Case has been reviewed and procedure approved by Dr. Anselm Pancoast.  Patient tentatively scheduled for 7.12.21.  Team instructed to: Keep Patient to be NPO after midnight Hold prophylactic anticoagulation 24 hours prior to scheduled procedure.  Should patient condition change and patient warranted an urgent thoracentesis. Patient may have to reschedule pleurex catheter when fluid has re accumulated enough to perform the procedure. This was discussed with Team, the patient and her son at bedside.    IR will call patient when ready.   Risks and benefits discussed with the patient including bleeding,  infection, damage to adjacent structures, including  pneumothorax.  All of the patient's questions were answered, patient is agreeable to proceed. Consent signed and in chart.    Thank you for this interesting consult.  I greatly enjoyed meeting Wendy [name] and look forward to participating in their care.  A copy of this report was sent to the requesting provider on this date.  Electronically Signed: Avel Peace, NP 04/09/2020, 2:54 PM   I spent a total of 40 Minutes    in face to face in clinical consultation, greater than 50% of which was counseling/coordinating care for thoracic pleurex catheter placement.

## 2020-04-09 NOTE — Evaluation (Signed)
Occupational Therapy Evaluation Patient Details Name: Wendy [name] MRN: [id number] DOB: 02-17-1941 Today's Date: 04/09/2020    History of Present Illness Wendy [name] is a 79 y.o. female with medical history significant of metastatic breast cancer on chemotherapy, hypertension, stage III CKD, type 2 diabetes, chronic anemia, major depression, anxiety, tobacco use, presents in the emergency department via EMS for shortness of breath.   Clinical Impression   Wendy [name] is a 79 year old woman admitted to hospital with shortness of breath with a history of metastatic breast cancer. On evaluation patient presents with generalized weakness, poor activity tolerance, impaired balance, and decreased cardiopulmonary endurance on 4 liters of oxygen. Patient able to perform daily tasks with predominantly set up and min assist and transfers/ambulation with min guard - to min assist. Patient required rest breaks and increased time and only able to tolerate transfer to [facility] and then to recliner due to poor activity tolerance. Patient will benefit from skilled OT services to improve deficits and learn compensatory strategies as needed in order to improve functional abilities and independence.     Follow Up Recommendations  Home health OT    Equipment Recommendations  None recommended by OT    Recommendations for Other Services       Precautions / Restrictions Precautions Precautions: Fall Precaution Comments: O2 dependent, watch SpO2 Restrictions Weight Bearing Restrictions: No      Mobility Bed Mobility Overal bed mobility: Modified Independent Bed Mobility: Supine to Sit     Supine to sit: Min guard;HOB elevated     General bed mobility comments: Patient able to perform bed transfer with supervision. Increased time and use of bed rails. Reports needing some help at times at home.  Transfers Overall transfer level: Needs assistance Equipment used: Rolling walker (2 wheeled) Transfers:  Sit to/from Stand Sit to Stand: Min guard         General transfer comment: min guard for safety    Balance Overall balance assessment: Mild deficits observed, not formally tested                                         ADL either performed or assessed with clinical judgement   ADL Overall ADL's : Needs assistance/impaired Eating/Feeding: Independent   Grooming: Sitting;Set up   Upper Body Bathing: Set up;Sitting   Lower Body Bathing: Minimal assistance;Sit to/from stand;Set up   Upper Body Dressing : Set up;Sitting   Lower Body Dressing: Minimal assistance;Set up;Sit to/from stand Lower Body Dressing Details (indicate cue type and reason): min assist to donn socks - patient exhibited difficulty getting socks over toes. Toilet Transfer: Min Forensic scientist and Hygiene: Min guard;Sit to/from stand Toileting - Clothing Manipulation Details (indicate cue type and reason): patient demonstrated ability to perform toileting - managing gown and wiping as needed     Functional mobility during ADLs: Min guard;Minimal assistance;Rolling walker General ADL Comments: Predominantly min guard for standing and transfers but patient reports fear of falling and fall history     Vision   Vision Assessment?: No apparent visual deficits     Perception     Praxis      Pertinent Vitals/Pain Pain Assessment: Faces Faces Pain Scale: Hurts a little bit Pain Location: bilateral lower legs Pain Descriptors / Indicators: Discomfort Pain Intervention(s): Monitored during session     Hand Dominance Right  Extremity/Trunk Assessment Upper Extremity Assessment Upper Extremity Assessment: Generalized weakness   Lower Extremity Assessment Lower Extremity Assessment: Defer to PT evaluation   Cervical / Trunk Assessment Cervical / Trunk Assessment: Kyphotic   Communication Communication Communication: No difficulties    Cognition Arousal/Alertness: Awake/alert Behavior During Therapy: WFL for tasks assessed/performed Overall Cognitive Status: Within Functional Limits for tasks assessed                                     General Comments       Exercises     Shoulder Instructions      Home Living Family/patient expects to be discharged to:: Private residence Living Arrangements: Children Available Help at Discharge: Family;Available 24 hours/day Type of Home: House       Home Layout: One level     Bathroom Shower/Tub: Teacher, early years/pre: Standard     Home Equipment: Shower seat;Hand held shower head;Grab bars - tub/shower;Bedside commode;Walker - 2 wheels;Other (comment)          Prior Functioning/Environment Level of Independence: Needs assistance  Gait / Transfers Assistance Needed: uses RW, at times with assistance with walking ADL's / Homemaking Assistance Needed: son assists to step over edge of tub, sometimes needs help with clothing and walking to the bathroom.            OT Problem List: Decreased strength;Decreased activity tolerance;Impaired balance (sitting and/or standing);Pain      OT Treatment/Interventions: Self-care/ADL training;Therapeutic exercise;DME and/or AE instruction;Patient/family education;Therapeutic activities;Balance training    OT Goals(Current goals can be found in the care plan section)    OT Frequency: Min 2X/week   Barriers to D/C:            Co-evaluation              AM-PAC OT "6 Clicks" Daily Activity     Outcome Measure Help from another person eating meals?: None Help from another person taking care of personal grooming?: A Little Help from another person toileting, which includes using toliet, bedpan, or urinal?: A Little Help from another person bathing (including washing, rinsing, drying)?: A Little Help from another person to put on and taking off regular upper body clothing?: A  Little Help from another person to put on and taking off regular lower body clothing?: A Little 6 Click Score: 19   End of Session Equipment Utilized During Treatment: Gait belt;Rolling walker Nurse Communication:  (okay to see per Rn, use of communication board)  Activity Tolerance: Patient tolerated treatment well Patient left: in chair;with call bell/phone within reach (Reported to Nursing staff that chair alarm pad placed but room without alarm)  OT Visit Diagnosis: Muscle weakness (generalized) (M62.81);Pain Pain - Right/Left:  (bil) Pain - part of body: Leg                Time: 1110-1132 OT Time Calculation (min): 22 min Charges:  OT General Charges $OT Visit: 1 Visit OT Evaluation $OT Eval Moderate Complexity: 1 Mod  Talisha Erby, OTR/L [place]  Office [phone] Pager: [place] 04/09/2020, 12:22 PM

## 2020-04-09 NOTE — TOC Initial Note (Addendum)
Transition of Care [facility]) - Initial/Assessment Note    Patient Details  Name: Wendy [name] MRN: [id number] Date of Birth: 10-Jul-1941  [facility] CM/SW Contact:    Shade Flood, LCSW Phone Number: 04/09/2020, 3:58 PM  Clinical Narrative:                  Pt admitted from home. One of her sons lives with her and assists with care. Pt currently active with [facility] for RN, Aide, and PT. Met with family and pt today to assess. Plan is for return to home at dc. Pt's DIL states that pt will dc home with a Plurex Cath in place and pt/family also interested in a hospital bed at dc. DIL also states that pt is active with [facility] at home. Pt has Home O2 as well.  Spoke with [place] at [facility] to update. Per [place], they cannot take pt back if she goes home with a Plurex catheter but if she doesn't, they can continue care. PT/OT recommending [facility] as well. Spoke with pt and DIL [name] about above and provided CMS provider list of [facility] options. Referred to Verda Cumins at pt/family request. Per Malachy Mood, they would be able to start care on Wednesday 7/14. Updated [name] and she states that this works for them as family is able to manage the cath and other care needs.   Will update MD on [facility] order needs and dc planning.  TOC will follow.   Expected Discharge Plan: [place] Barriers to Discharge: Continued Medical Work up   Patient Goals and CMS Choice Patient states their goals for this hospitalization and ongoing recovery are:: go home CMS Medicare.gov Compare Post Acute Care list provided to:: Patient Represenative (must comment) Choice offered to / list presented to : Adult Children  Expected Discharge Plan and Services Expected Discharge Plan: [place] In-house Referral: Clinical Social Work   Post Acute Care Choice: Durable Medical Equipment, Home Health Living arrangements for the past 2 months: [place]                                      Prior Living Arrangements/Services Living arrangements for the past 2 months: Single Family Home Lives with:: Adult Children Patient language and need for interpreter reviewed:: Yes Do you feel safe going back to the place where you live?: Yes      Need for Family Participation in Patient Care: Yes (Comment) Care giver support system in place?: Yes (comment) Current home services: DME, Home RN, Homehealth aide, Home PT Criminal Activity/Legal Involvement Pertinent to Current Situation/Hospitalization: No - Comment as needed  Activities of Daily Living Home Assistive Devices/Equipment: Eyeglasses, Environmental consultant (specify type) ADL Screening (condition at time of admission) Patient's cognitive ability adequate to safely complete daily activities?: Yes Is the patient deaf or have difficulty hearing?: No Does the patient have difficulty seeing, even when wearing glasses/contacts?: No Does the patient have difficulty concentrating, remembering, or making decisions?: No Patient able to express need for assistance with ADLs?: Yes Does the patient have difficulty dressing or bathing?: Yes Independently performs ADLs?: No Does the patient have difficulty walking or climbing stairs?: Yes Weakness of Legs: Both Weakness of Arms/Hands: None  Permission Sought/Granted                  Emotional Assessment Appearance:: Appears younger  than stated age Attitude/Demeanor/Rapport: Engaged Affect (typically observed): Pleasant Orientation: : Oriented to Self, Oriented to Place, Oriented to  Time, Oriented to Situation Alcohol / Substance Use: Not Applicable Psych Involvement: No (comment)  Admission diagnosis:  Shortness of breath [R06.02] Acute respiratory failure with hypoxia and hypercapnia (HCC) [J96.01, J96.02] Patient Active Problem List   Diagnosis Date Noted  . Acute respiratory failure with hypoxia and hypercapnia ([place]) 04/08/2020  .  CAP (community acquired pneumonia) 03/25/2020  . DNR (do not resuscitate) 03/24/2020  . Malignant pericardial effusion ([place]) 03/23/2020  . Acute respiratory failure with hypoxia ([place]) 03/22/2020  . Respiratory distress   . Palliative care by specialist   . 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 Metastatic breast cancer ([place]) 03/02/2020  . Advanced care planning/counseling discussion   . Moderate persistent asthma 01/31/2020  . Allergic rhinitis 01/31/2020  . GERD (gastroesophageal reflux disease) 01/31/2020  . Goals of care, counseling/discussion 01/08/2020  . Lymphedema of left arm 09/03/2019  . 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[place]) 12/05/2018  . Grief 05/10/2018  . Anemia 01/09/2018  . Fatigue 03/14/2017  . Edema 03/14/2017  . Shortness of breath 03/14/2017  . Port catheter in place 01/18/2017  . Rectal bleeding 01/17/2017  . Constipation 01/17/2017  . Malignant neoplasm of upper-outer quadrant of left breast in female, estrogen receptor positive ([place]) 11/20/2016  . CKD (chronic kidney disease) stage 3, GFR 30-59 ml/min (HCC) 03/17/2016  . 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 Seasonal allergies 12/01/2013  . TOBACCO USE 04/13/2008  . Hyperlipidemia associated with type 2 diabetes mellitus ([place]) 11/29/2006  . Morbid obesity ([place]) 11/29/2006  . Essential hypertension 11/29/2006   PCP:  Marin Olp, MD Pharmacy:   [id number] [place] [place]Phone: [phone]: [phone] [place]Phone: [phone]: [phone]    Social Determinants of Health (SDOH) Interventions    Readmission Risk Interventions Readmission Risk Prevention Plan 04/09/2020   Transportation Screening Complete  Medication Review (RN Care Manager) Complete  HRI or Home Care Consult Complete  SW Recovery Care/Counseling Consult Complete  Palliative Care Screening Complete  Skilled Nursing Facility Complete  Some recent data might be hidden

## 2020-04-09 NOTE — Progress Notes (Signed)
Inpatient Diabetes Program Recommendations  AACE/ADA: New Consensus Statement on Inpatient Glycemic Control (2015)  Target Ranges:  Prepandial:   less than 140 mg/dL      Peak postprandial:   less than 180 mg/dL (1-2 hours)      Critically ill patients:  140 - 180 mg/dL   Lab Results  Component Value Date   GLUCAP 387 (H) 04/09/2020   HGBA1C 7.2 (H) 03/02/2020    Review of Glycemic Control  Diabetes history: DM2 Outpatient Diabetes medications: Lantus < 240 - 28 units, > 240, 35 units; Novolin R 10 units tid Current orders for Inpatient glycemic control: Lantus 28 units QHS, Novolog 0-9 units tidwc  HgbA1C - 7.2% - good glycemic control On Solumedrol 40 mg bid  Inpatient Diabetes Program Recommendations:     Add Novolog 4 units tidwc for meal coverage insulin if pt eats > 50% meal.  Will continue to follow.  Thank you. Lorenda Peck, RD, LDN, CDE Inpatient Diabetes Coordinator [phone]

## 2020-04-09 NOTE — Plan of Care (Signed)

## 2020-04-10 DIAGNOSIS — I10 Essential (primary) hypertension: Secondary | ICD-10-CM

## 2020-04-10 DIAGNOSIS — N1831 Chronic kidney disease, stage 3a: Secondary | ICD-10-CM

## 2020-04-10 DIAGNOSIS — K219 Gastro-esophageal reflux disease without esophagitis: Secondary | ICD-10-CM

## 2020-04-10 DIAGNOSIS — Z170 Estrogen receptor positive status [ER+]: Secondary | ICD-10-CM

## 2020-04-10 DIAGNOSIS — C50412 Malignant neoplasm of upper-outer quadrant of left female breast: Principal | ICD-10-CM

## 2020-04-10 LAB — PROCALCITONIN: Procalcitonin: 0.14 ng/mL

## 2020-04-10 LAB — CBC
HCT: 22.2 % — ABNORMAL LOW (ref 36.0–46.0)
Hemoglobin: 7.6 g/dL — ABNORMAL LOW (ref 12.0–15.0)
MCH: 30.2 pg (ref 26.0–34.0)
MCHC: 34.2 g/dL (ref 30.0–36.0)
MCV: 88.1 fL (ref 80.0–100.0)
Platelets: 150 10*3/uL (ref 150–400)
RBC: 2.52 MIL/uL — ABNORMAL LOW (ref 3.87–5.11)
RDW: 16.6 % — ABNORMAL HIGH (ref 11.5–15.5)
WBC: 7.3 10*3/uL (ref 4.0–10.5)
nRBC: 0.8 % — ABNORMAL HIGH (ref 0.0–0.2)

## 2020-04-10 LAB — BASIC METABOLIC PANEL
Anion gap: 12 (ref 5–15)
BUN: 38 mg/dL — ABNORMAL HIGH (ref 8–23)
CO2: 30 mmol/L (ref 22–32)
Calcium: 7.8 mg/dL — ABNORMAL LOW (ref 8.9–10.3)
Chloride: 94 mmol/L — ABNORMAL LOW (ref 98–111)
Creatinine, Ser: 1.76 mg/dL — ABNORMAL HIGH (ref 0.44–1.00)
GFR calc Af Amer: 32 mL/min — ABNORMAL LOW (ref >60)
GFR calc non Af Amer: 27 mL/min — ABNORMAL LOW (ref >60)
Glucose, Bld: 441 mg/dL — ABNORMAL HIGH (ref 70–99)
Potassium: 4 mmol/L (ref 3.5–5.1)
Sodium: 136 mmol/L (ref 135–145)

## 2020-04-10 LAB — GLUCOSE, CAPILLARY
Glucose-Capillary: 421 mg/dL — ABNORMAL HIGH (ref 70–99)
Glucose-Capillary: 415 mg/dL — ABNORMAL HIGH (ref 70–99)
Glucose-Capillary: 443 mg/dL — ABNORMAL HIGH (ref 70–99)
Glucose-Capillary: 345 mg/dL — ABNORMAL HIGH (ref 70–99)

## 2020-04-10 LAB — MAGNESIUM: Magnesium: 2 mg/dL (ref 1.7–2.4)

## 2020-04-10 LAB — PHOSPHORUS: Phosphorus: 3.3 mg/dL (ref 2.5–4.6)

## 2020-04-10 MED ORDER — ALBUTEROL SULFATE (2.5 MG/3ML) 0.083% IN NEBU
2.5000 mg | INHALATION_SOLUTION | Freq: Three times a day (TID) | RESPIRATORY_TRACT | Status: DC
  Administered 2020-04-10: 2.5 mg via RESPIRATORY_TRACT
  Filled 2020-04-10: qty 3

## 2020-04-10 MED ORDER — INSULIN ASPART 100 UNIT/ML ~~LOC~~ SOLN
0.0000 [IU] | Freq: Every day | SUBCUTANEOUS | Status: DC
  Administered 2020-04-10: 4 [IU] via SUBCUTANEOUS
  Administered 2020-04-11: 2 [IU] via SUBCUTANEOUS

## 2020-04-10 MED ORDER — INSULIN ASPART 100 UNIT/ML ~~LOC~~ SOLN
0.0000 [IU] | Freq: Three times a day (TID) | SUBCUTANEOUS | Status: DC
  Administered 2020-04-11: 20 [IU] via SUBCUTANEOUS
  Administered 2020-04-11: 15 [IU] via SUBCUTANEOUS
  Administered 2020-04-11: 11 [IU] via SUBCUTANEOUS
  Administered 2020-04-12: 7 [IU] via SUBCUTANEOUS
  Administered 2020-04-13: 4 [IU] via SUBCUTANEOUS

## 2020-04-10 MED ORDER — ALUM & MAG HYDROXIDE-SIMETH 200-200-20 MG/5ML PO SUSP
30.0000 mL | ORAL | Status: DC | PRN
  Administered 2020-04-10: 30 mL via ORAL
  Filled 2020-04-10: qty 30

## 2020-04-10 MED ORDER — ALBUTEROL SULFATE (2.5 MG/3ML) 0.083% IN NEBU
2.5000 mg | INHALATION_SOLUTION | Freq: Two times a day (BID) | RESPIRATORY_TRACT | Status: DC
  Administered 2020-04-10 – 2020-04-13 (×4): 2.5 mg via RESPIRATORY_TRACT
  Filled 2020-04-10 (×6): qty 3

## 2020-04-10 NOTE — Progress Notes (Signed)
PROGRESS NOTE    Wendy [name]  DEY:[id number] DOB: 1941/05/07 DOA: 04/08/2020 PCP: Marin Olp, MD   Brief Narrative:  Wendy Tates Jonesis a 79 y.o.femalewith medical history significant ofmetastatic breast cancer on chemotherapy, hypertension, stage III CKD, type 2 diabetes, chronic anemia, major depression, anxiety, tobacco use,presents in the emergency department via EMS for shortness of breath. Patient was recently hospitalized on March 22, 2020 for community-acquired pneumonia. Patient uses oxygen at baseline at 4 L via nasal cannula saturating 92%.  She was found confused and had significant difficulty breathing at the facility.Her O2 saturation was 50% on4Lwhich improved to 70% when her oxygen was increased to 6 L. After EMS arrived,patient was found hypothermic,she was placed on 10 L oxygen via nonrebreather. Wendy [name] reports that she appeared to be confused and suspect this could be because of her hypoxia.Wendy [name] denies any fever, cough, nausea, vomiting, history of clots, leg swelling, recent travel, sick contacts. DJS:HFWYOV Moderate right  bilateral pleural effusions. During previous hospitalization she was found to have the pleural effusion but did not have significant effusion at the time of discharge to have a Pleurx catheter placed.  Plan: Pleurx catheter by IR 7/12 , discharge back to skilled nursing facility.   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 improving  DiffDx.includes COPD ,CHF exacerbation,malignant pleural effusion, HCApneumonia: O2 saturationon arrival 70%on 6 L,placed on non rebreatherbriefly. Continue supplemental oxygen to maintain saturation above 94%. Her breathing has significantly improved after she received Solu-Medrol 125 mgonce. She is weaned down to 4 L supplemental oxygen saturating >90%. Chest x-ray shows findings consistent with pulmonary edema,moderate right pleural effusion- repeat cxr in am Continue DuoNeb,Solu-Medrol 40 every 12hr. Pulmonology consulted, recommendedDC antibiotics, needs Pluerix catheter Follow blood and urine cultures. proBNP,procalcitonin level normal  ,CovidPCR. Echo6/21:Left ventricular EF 55 to 60%   Bilateral pleural effusion: She appears comfortable after getting Solu-Medrol and breathing treatment. Plan: Pleurx catheter by IR 7/12   History of metastatic breast cancer: Continue chemotherapy. Outpatient oncology follow up.  Acute kidney injury on CKD stage III: Creatinine 1.9, baseline creatinine1.4 Avoid nephrotoxic medications, Recheck labs tomorrow morning  Essential hypertension: Continue clonidine, amlodipine, hold lisinopril HCTZ  Diabetes mellitus type 2: Continue Lantus, regular insulin sliding scale Obtainhemoglobin A1c  Chronic anemia: Could be secondary tobreast ca and chemotherapy  DVT prophylaxis: Lovenox SQ  Code Status: DNR    Code Status Orders  (From admission, onward)         Start     Ordered   04/08/20 1846  Do not attempt resuscitation (DNR)  Continuous       Question Answer Comment  In the event of cardiac or respiratory ARREST Do not call a "code blue"   In the event of cardiac or respiratory ARREST Do not perform Intubation, CPR, defibrillation or ACLS   In the event of cardiac or respiratory ARREST Use medication by any route, position, wound  care, and other measures to relive pain and suffering. May use  oxygen, suction and manual treatment of airway obstruction as needed for comfort.      04/08/20 1845        Code Status History    Date Active Date Inactive Code Status Order ID Comments User Context   03/22/2020 1603 03/25/2020 2034 DNR [id number]  Lequita Halt, MD ED   03/03/2020 1111 03/05/2020 2314 DNR [id number]  Earlie Counts, NP Inpatient   03/02/2020 0619 03/03/2020 1110 Full Code [id number]  Shela Leff, MD Inpatient   Advance Care Planning Activity    Advance Directive Documentation     Most Recent Value  Type of Advance Directive Living will  Pre-existing out of facility DNR order (yellow form or pink MOST form) --  "MOST" Form in Place? --     Family Communication: NONE TODAY  Disposition Plan:  Status is: Inpatient  Remains inpatient appropriate because:Inpatient level of care appropriate due to severity of illness   Dispo: The patient is from: Home              Anticipated d/c is to: SNF              Anticipated d/c date is: 2 days              Patient currently is not medically stable to d/c.       sults called: ir, pccm Admission status: Inpatient   Consultants:   AS ABOVE  Procedures:  DG Chest 1 View  Result Date: 03/18/2020 CLINICAL DATA:  79 year old female status post right thoracentesis. EXAM: CHEST  1 VIEW COMPARISON:  Chest x-ray 03/02/2020. FINDINGS: No pneumothorax. Small left pleural effusion. No definite right pleural effusion. Patchy multifocal interstitial and airspace disease noted throughout the mid to lower lungs bilaterally. No evidence of pulmonary edema. Heart size is normal. Upper mediastinal contours are within normal limits. Surgical clips in the left axillary region, likely from prior lymph node dissection. IMPRESSION: 1. No pneumothorax in this patient status post thoracentesis. 2. Small left pleural effusion. 3. Multilobar bilateral pneumonia throughout the mid to lower  lungs bilaterally. Electronically Signed   By: Vinnie Langton M.D.   On: 03/18/2020 11:21   DG Chest 2 View  Result Date: 04/08/2020 CLINICAL DATA:  Shortness of breath EXAM: CHEST - 2 VIEW COMPARISON:  03/22/2020 radiograph, CT 03/22/2020 FINDINGS: Moderate right greater than left bilateral pleural effusions, increased compared to prior. Worsening airspace disease at both lung bases. Enlarged cardiomediastinal silhouette with vascular congestion and increased pulmonary edema. Pulmonary metastatic nodules not well appreciated likely due to diffuse bilateral airspace disease. No pneumothorax. IMPRESSION: 1. Moderate right greater than left bilateral pleural effusions, increased compared to prior with worsening airspace disease at both lung bases. 2. Cardiomegaly with vascular congestion and increased pulmonary edema. Electronically Signed   By: Donavan Foil M.D.   On: 04/08/2020 15:32   CT Angio Chest PE W and/or Wo Contrast  Result Date: 03/22/2020 CLINICAL DATA:  Shortness of breath, history of metastatic breast cancer and pleural effusions EXAM: CT ANGIOGRAPHY CHEST WITH CONTRAST TECHNIQUE: Multidetector CT imaging of the chest was performed using the standard protocol during bolus administration of intravenous contrast. Multiplanar CT image reconstructions and MIPs were obtained to evaluate the vascular anatomy. CONTRAST:  20mL OMNIPAQUE IOHEXOL 350 MG/ML SOLN COMPARISON:  CT chest angiogram, 03/02/2020 FINDINGS: Cardiovascular: Satisfactory opacification of the pulmonary arteries to the segmental level. No evidence of pulmonary embolism. Normal heart size. Trace pericardial effusion. Mediastinum/Nodes: No enlarged mediastinal, hilar, or axillary lymph  nodes. Thyroid gland, trachea, and esophagus demonstrate no significant findings. Lungs/Pleura: Moderate right, small left pleural effusions increased compared to prior CT dated 03/02/2020. Numerous bilateral pulmonary nodules. Upper Abdomen: No acute  abnormality. Musculoskeletal: Soft tissue mass in the lateral left breast and axilla. No acute or significant osseous findings. Review of the MIP images confirms the above findings. IMPRESSION: 1. Negative examination for pulmonary embolism. 2. Moderate right, small left pleural effusions, increased compared to prior CT dated 03/02/2020. 3. Numerous bilateral pulmonary nodules, in keeping with metastatic breast malignancy. 4. Trace pericardial effusion, unchanged compared to prior examination. 5. Soft tissue mass in the lateral left breast and axilla, in keeping with known breast malignancy. Electronically Signed   By: Eddie Candle M.D.   On: 03/22/2020 15:09   DG Chest Portable 1 View  Result Date: 03/22/2020 CLINICAL DATA:  Shortness of breath.  Metastatic breast carcinoma. EXAM: PORTABLE CHEST 1 VIEW COMPARISON:  03/18/2020 FINDINGS: Moderate cardiomegaly appears mildly increased since previous study. Increased diffuse interstitial infiltrates may be due to interstitial edema. New confluent opacity is seen in the right midlung, suspicious for pneumonia. Left lower lobe atelectasis or infiltrate shows no significant change. Probable small bilateral pleural effusions noted. Small pulmonary nodules are noted, but less well visualized than on prior exam. Multiple surgical clips again seen in the left axilla. IMPRESSION: 1. New confluent opacity in right midlung, suspicious for pneumonia. 2. Mild increase in cardiomegaly and diffuse interstitial infiltrates, suspicious for congestive heart failure. 3. Stable left lower lobe atelectasis versus infiltrate. Probable small bilateral pleural effusions. 4. Pulmonary metastases are less well visualized than on previous study. Electronically Signed   By: Marlaine Hind M.D.   On: 03/22/2020 12:03   ECHOCARDIOGRAM LIMITED  Result Date: 03/23/2020    ECHOCARDIOGRAM REPORT   Patient Name:   THRESSA SHIFFER Date of Exam: 03/23/2020 Medical Rec #:  [id number]    Height:        62.0 in Accession #:    [id number]   Weight:       200.4 lb Date of Birth:  11/15/1940    BSA:          1.913 m Patient Age:    69 years     BP:           163/101 mmHg Patient Gender: F            HR:           100 bpm. Exam Location:  Inpatient Procedure: Limited Echo, Cardiac Doppler and Color Doppler Indications:     Pericardial effusion 423.9 / I31.3  History:         Patient has prior history of Echocardiogram examinations, most                  recent 02/05/2020. Risk Factors:Diabetes, Hypertension and                  Dyslipidemia. Multiple metastatic breast cancer, ,metastatic                  pleural effusion, GERD, Chronic kidney disease.  Sonographer:     Darlina Sicilian RDCS Referring Phys:  [id number] Lequita Halt Diagnosing Phys: Fransico Him MD IMPRESSIONS  1. Left ventricular ejection fraction, by estimation, is 55 to 60%. The left ventricle has normal function. Left ventricular endocardial border not optimally defined to evaluate regional wall motion. Left ventricular diastolic function could not be evaluated.  2. Right ventricular systolic function  was not well visualized. The right ventricular size is not well visualized. There is moderately elevated pulmonary artery systolic pressure. The estimated right ventricular systolic pressure is 69.6 mmHg.  3. The mitral valve is normal in structure. No evidence of mitral valve regurgitation. No evidence of mitral stenosis.  4. The aortic valve is normal in structure. Aortic valve regurgitation is not visualized. No aortic stenosis is present.  5. The inferior vena cava is dilated in size with >50% respiratory variability, suggesting right atrial pressure of 8 mmHg.  6. A small pericardial effusion is present. The pericardial effusion is circumferential. There is no evidence of cardiac tamponade. FINDINGS  Left Ventricle: Left ventricular ejection fraction, by estimation, is 55 to 60%. The left ventricle has normal function. Left ventricular endocardial border  not optimally defined to evaluate regional wall motion. The left ventricular internal cavity size was normal in size. There is no left ventricular hypertrophy. Left ventricular diastolic function could not be evaluated. Right Ventricle: The right ventricular size is not well visualized. Right vetricular wall thickness was not assessed. Right ventricular systolic function was not well visualized. There is moderately elevated pulmonary artery systolic pressure. The tricuspid regurgitant velocity is 2.85 m/s, and with an assumed right atrial pressure of 8 mmHg, the estimated right ventricular systolic pressure is 29.5 mmHg. Left Atrium: Left atrial size was normal in size. Right Atrium: Right atrial size was normal in size. Pericardium: A small pericardial effusion is present. The pericardial effusion is circumferential. There is no evidence of cardiac tamponade. Mitral Valve: The mitral valve is normal in structure. There is mild calcification of the anterior mitral valve leaflet(s). Normal mobility of the mitral valve leaflets. Mild mitral annular calcification. No evidence of mitral valve regurgitation. No evidence of mitral valve stenosis. Tricuspid Valve: The tricuspid valve is normal in structure. Tricuspid valve regurgitation is mild . No evidence of tricuspid stenosis. Aortic Valve: The aortic valve is normal in structure. Aortic valve regurgitation is not visualized. No aortic stenosis is present. Pulmonic Valve: The pulmonic valve was normal in structure. Pulmonic valve regurgitation is not visualized. No evidence of pulmonic stenosis. Aorta: The aortic root is normal in size and structure. Venous: The inferior vena cava is dilated in size with greater than 50% respiratory variability, suggesting right atrial pressure of 8 mmHg. IAS/Shunts: No atrial level shunt detected by color flow Doppler.  LEFT VENTRICLE PLAX 2D LVIDd:         4.00 cm LVIDs:         2.90 cm LV PW:         1.00 cm LV IVS:        1.10 cm  LVOT diam:     2.00 cm LVOT Area:     3.14 cm  LV Volumes (MOD) LV vol d, MOD A2C: 41.1 ml LV vol d, MOD A4C: 66.2 ml LV vol s, MOD A2C: 24.2 ml LV vol s, MOD A4C: 27.8 ml LV SV MOD A2C:     16.9 ml LV SV MOD A4C:     66.2 ml LV SV MOD BP:      28.0 ml LEFT ATRIUM         Index LA diam:    3.70 cm 1.93 cm/m   AORTA Ao Root diam: 3.20 cm TRICUSPID VALVE TR Peak grad:   32.5 mmHg TR Vmax:        285.00 cm/s  SHUNTS Systemic Diam: 2.00 cm Fransico Him MD Electronically signed by Fransico Him MD  Signature Date/Time: 03/23/2020/3:43:48 PM    Final (Updated)    US Thoracentesis Asp Pleural space w/IMG guide  Result Date: 03/18/2020 INDICATION: Patient with history of metastatic breast cancer, dyspnea, recurrent right pleural effusion. Request made for therapeutic right thoracentesis. EXAM: ULTRASOUND GUIDED THERAPEUTIC RIGHT THORACENTESIS MEDICATIONS: None COMPLICATIONS: None immediate. PROCEDURE: An ultrasound guided thoracentesis was thoroughly discussed with the patient and questions answered. The benefits, risks, alternatives and complications were also discussed. The patient understands and wishes to proceed with the procedure. Written consent was obtained. Ultrasound was performed to localize and mark an adequate pocket of fluid in the right chest. The area was then prepped and draped in the normal sterile fashion. 1% Lidocaine was used for local anesthesia. Under ultrasound guidance a 6 Fr Safe-T-Centesis catheter was introduced. Thoracentesis was performed. The catheter was removed and a dressing applied. FINDINGS: A total of approximately 400 cc of slightly hazy, yellow fluid was removed. IMPRESSION: Successful ultrasound guided therapeutic right thoracentesis yielding 400 cc of pleural fluid. Read by: Rowe Robert, PA-C Electronically Signed   By: Corrie Mckusick D.O.   On: 03/18/2020 10:53     Antimicrobials:   NONE    Subjective: Patient reports still short of breath though not as  bad.  Objective: Vitals:   04/10/20 0815 04/10/20 0842 04/10/20 0907 04/10/20 1239  BP: (!) 137/91  (!) 130/55 136/60  Pulse:   72 72  Resp:   18 19  Temp:   (!) 97.4 F (36.3 C) (!) 97.4 F (36.3 C)  TempSrc:      SpO2:  100% 100% 100%  Weight:      Height:        Intake/Output Summary (Last 24 hours) at 04/10/2020 1421 Last data filed at 04/10/2020 1200 Gross per 24 hour  Intake 720 ml  Output 200 ml  Net 520 ml   Filed Weights   04/08/20 1227 04/10/20 0418  Weight: 94.8 kg 89.4 kg    Examination:  General exam: Appears calm and comfortable  Respiratory system: Clear to auscultation. Respiratory effort normal. Cardiovascular system: S1 & S2 heard, RRR. No JVD, murmurs, rubs, gallops or clicks. No pedal edema. Gastrointestinal system: Abdomen is nondistended, soft and nontender. No organomegaly or masses felt. Normal bowel sounds heard. Central nervous system: Alert and oriented. No focal neurological deficits. Extremities: Thin cachectic globally weak no focal findings Skin: No draining lesions Psychiatry: Judgement and insight appear normal. Mood & affect appropriate.     Data Reviewed: I have personally reviewed following labs and imaging studies  CBC: Recent Labs  Lab 04/07/20 1103 04/08/20 1532 04/09/20 0445 04/10/20 0536  WBC 7.8 8.4 8.3 7.3  NEUTROABS 6.0 7.5  --   --   HGB 8.6* 9.1* 8.0* 7.6*  HCT 25.8* 27.3* 23.5* 22.2*  MCV 90.8 89.5 89.4 88.1  PLT 149* 174 160 035   Basic Metabolic Panel: Recent Labs  Lab 04/07/20 1103 04/08/20 1532 04/09/20 0445 04/09/20 1811 04/10/20 0536  NA 142 140  --   --  136  K 4.2 4.4  --   --  4.0  CL 99 96*  --   --  94*  CO2 36* 32  --   --  30  GLUCOSE 141* 278*  --  458* 441*  BUN 11 18  --   --  38*  CREATININE 1.56* 1.91* 2.03*  --  1.76*  CALCIUM 9.1 8.7*  --   --  7.8*  MG  --   --   --   --  2.0  PHOS  --   --   --   --  3.3   GFR: Estimated Creatinine Clearance: 27.4 mL/min (A) (by C-G formula  based on SCr of 1.76 mg/dL (H)). Liver Function Tests: Recent Labs  Lab 04/07/20 1103  AST 11*  ALT 6  ALKPHOS 121  BILITOT 0.5  PROT 6.4*  ALBUMIN 3.2*   No results for input(s): LIPASE, AMYLASE in the last 168 hours. No results for input(s): AMMONIA in the last 168 hours. Coagulation Profile: No results for input(s): INR, PROTIME in the last 168 hours. Cardiac Enzymes: No results for input(s): CKTOTAL, CKMB, CKMBINDEX, TROPONINI in the last 168 hours. BNP (last 3 results) Recent Labs    09/15/19 1552  PROBNP CANCELED   HbA1C: No results for input(s): HGBA1C in the last 72 hours. CBG: Recent Labs  Lab 04/09/20 1159 04/09/20 1742 04/09/20 1943 04/10/20 0745 04/10/20 1110  GLUCAP 387* 409* 496* 421* 415*   Lipid Profile: No results for input(s): CHOL, HDL, LDLCALC, TRIG, CHOLHDL, LDLDIRECT in the last 72 hours. Thyroid Function Tests: Recent Labs    04/08/20 1325  TSH 9.457*   Anemia Panel: No results for input(s): VITAMINB12, FOLATE, FERRITIN, TIBC, IRON, RETICCTPCT in the last 72 hours. Sepsis Labs: Recent Labs  Lab 04/08/20 1325 04/08/20 1532 04/08/20 1939 04/09/20 0445 04/10/20 0536  PROCALCITON  --   --  0.14 0.19 0.14  LATICACIDVEN 1.4 1.3  --   --   --     Recent Results (from the past 240 hour(s))  Blood culture (routine x 2)     Status: None (Preliminary result)   Collection Time: 04/08/20 12:58 PM   Specimen: BLOOD  Result Value Ref Range Status   Specimen Description   Final    BLOOD RIGHT ARM Performed at [facility] [address], [place], [place] [address]    Special Requests   Final    BOTTLES DRAWN AEROBIC AND ANAEROBIC Blood Culture adequate volume Performed at [facility] [address] [place], [place] [address]    Culture   Final    NO GROWTH < 24 HOURS Performed at [facility] [address], [place], [place] [address]    Report Status PENDING  Incomplete  Urine culture      Status: None   Collection Time: 04/08/20  1:53 PM   Specimen: Urine, Catheterized  Result Value Ref Range Status   Specimen Description   Final    URINE, CATHETERIZED Performed at [facility] [address] [place], [place] [address]    Special Requests   Final    NONE Performed at [facility], [place] [address], [place], [place] [address]    Culture   Final    NO GROWTH Performed at [facility], [place] [address] [place], [place] [address]    Report Status 04/09/2020 FINAL  Final  SARS Coronavirus 2 by RT PCR (hospital order, performed in [facility] hospital lab) Nasopharyngeal Nasopharyngeal Swab     Status: None   Collection Time: 04/08/20  6:27 PM   Specimen: Nasopharyngeal Swab  Result Value Ref Range Status   SARS Coronavirus 2 NEGATIVE NEGATIVE Final    Comment: (NOTE) SARS-CoV-2 target nucleic acids are NOT DETECTED.  The SARS-CoV-2 RNA is generally detectable in upper and lower respiratory specimens during the acute phase of infection. The lowest concentration of SARS-CoV-2 viral copies this assay can detect is 250 copies /  mL. A negative result does not preclude SARS-CoV-2 infection and should not be used as the sole basis for treatment or other patient management decisions.  A negative result may occur with improper specimen collection / handling, submission of specimen other than nasopharyngeal swab, presence of viral mutation(s) within the areas targeted by this assay, and inadequate number of viral copies (<250 copies / mL). A negative result must be combined with clinical observations, patient history, and epidemiological information.  Fact Sheet for Patients:   StrictlyIdeas.no  Fact Sheet for Healthcare Providers: BankingDealers.co.za  This test is not yet approved or  cleared by the [place] FDA and has been authorized for detection and/or diagnosis of  SARS-CoV-2 by FDA under an Emergency Use Authorization (EUA).  This EUA will remain in effect (meaning this test can be used) for the duration of the COVID-19 declaration under Section 564(b)(1) of the Act, 21 U.S.C. section 360bbb-3(b)(1), unless the authorization is terminated or revoked sooner.  Performed at [facility], [place] [address], [place], [place] [address]   Blood culture (routine x 2)     Status: None (Preliminary result)   Collection Time: 04/08/20  9:04 PM   Specimen: BLOOD RIGHT HAND  Result Value Ref Range Status   Specimen Description   Final    BLOOD RIGHT HAND Performed at [facility] [address], [place], [place] [address]    Special Requests   Final    BOTTLES DRAWN AEROBIC ONLY Blood Culture adequate volume Performed at [facility] [address], [place], [place] [address]    Culture   Final    NO GROWTH < 24 HOURS Performed at [facility] [address] [place], [place] [address]    Report Status PENDING  Incomplete  MRSA PCR Screening     Status: None   Collection Time: 04/09/20  9:29 AM   Specimen: Nasal Mucosa; Nasopharyngeal  Result Value Ref Range Status   MRSA by PCR NEGATIVE NEGATIVE Final    Comment:        The GeneXpert MRSA Assay (FDA approved for NASAL specimens only), is one component of a comprehensive MRSA colonization surveillance program. It is not intended to diagnose MRSA infection nor to guide or monitor treatment for MRSA infections. Performed at [facility], [place] [address], [place], [place] [address]          Radiology Studies: DG Chest 2 View  Result Date: 04/08/2020 CLINICAL DATA:  Shortness of breath EXAM: CHEST - 2 VIEW COMPARISON:  03/22/2020 radiograph, CT 03/22/2020 FINDINGS: Moderate right greater than left bilateral pleural effusions, increased compared to prior. Worsening airspace disease at both lung bases. Enlarged  cardiomediastinal silhouette with vascular congestion and increased pulmonary edema. Pulmonary metastatic nodules not well appreciated likely due to diffuse bilateral airspace disease. No pneumothorax. IMPRESSION: 1. Moderate right greater than left bilateral pleural effusions, increased compared to prior with worsening airspace disease at both lung bases. 2. Cardiomegaly with vascular congestion and increased pulmonary edema. Electronically Signed   By: Donavan Foil M.D.   On: 04/08/2020 15:32        Scheduled Meds: . albuterol  2.5 mg Nebulization BID  . amLODipine  10 mg Oral Daily  . busPIRone  10 mg Oral TID  . carvedilol  25 mg Oral BID WC  . cloNIDine  0.3 mg Oral BID  . docusate sodium  100 mg Oral BID  . enoxaparin (LOVENOX) injection  30  mg Subcutaneous Q24H  . famotidine  20 mg Oral Daily  . fluticasone furoate-vilanterol  1 puff Inhalation Daily  . furosemide  20 mg Oral Daily  . guaiFENesin  600 mg Oral BID  . insulin aspart  0-9 Units Subcutaneous TID WC  . insulin glargine  28 Units Subcutaneous QHS  . methylPREDNISolone (SOLU-MEDROL) injection  40 mg Intravenous BID  . montelukast  10 mg Oral QHS  . pravastatin  20 mg Oral q1800   Continuous Infusions:   LOS: 2 days    Time spent: 35 minutes    Nicolette Bang, MD [facility]  If 7PM-7AM, please contact night-coverage  04/10/2020, 2:21 PM

## 2020-04-10 NOTE — Progress Notes (Signed)
Pts CBG 415, MD notified, verbal orders given to administer 15 units of insulin.

## 2020-04-10 NOTE — Progress Notes (Signed)
Pt had an episode of epigastric pain that radiated to her right jaw. Pt mildly short of breath. Pt assisted back to bed from chair. Vital signs stable HR 79, BP 152/70, 02 98% on 4L, RR 20. EKG complete and placed in chart. Pt stated that the pain went away and she was feeling better. MD made aware. No new orders at this time. Will continue to monitor.

## 2020-04-10 NOTE — Progress Notes (Signed)
Pt CBG 443, MD notified, gave verbal orders to give 15 units of insulin. See new orders.

## 2020-04-10 NOTE — Progress Notes (Signed)
Pt CBG 421, MD paged, verbal orders given to administer 10 units of insulin.

## 2020-04-10 NOTE — Progress Notes (Signed)
   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 Abnormal breast finding 2018   per pt/ having  a lot drainage from left breast nipple  . Anemia   . Anxiety   . Breast cancer ([place]) 11/2016   left/  . CKD (chronic kidney disease)   . Depression   . GERD (gastroesophageal reflux disease)    TUMS as needed  . HTN (hypertension)    states BP has been high recently; has been on med. x 20 yr.  . Hyperlipidemia   . Hyperplastic colon polyp   . HYPERTENSION, BENIGN SYSTEMIC 11/29/2006   Lisinopril hctz 10-12.5mg , amlodipine 10mg , coreg 25mg  BID, clonidine 0.3 mg BID.   Marland Kitchen Insulin dependent diabetes mellitus   . Personal history of chemotherapy    2018/finished 6 weeks of chemo in Sep 2018  . Personal history of radiation therapy    2018 left breast/finished radiation in Sept 2018 per pt.     Significant Hospital Events   Feeling better  Consults:  PCCM   Procedures:  none  Significant Diagnostic Tests:  Chest x-ray 04/08/2020 -Bilateral pleural effusion worse on the right  Micro Data:  Blood culture 7/8 Urine culture 7/8 MRSA PCR negative  Antimicrobials:    Cefepime 7/8 Vancomycin 7/8  Interim history/subjective:  No overnight events,   Objective   Blood pressure 136/60, pulse 72, temperature (!) 97.4 F (36.3 C), resp. rate 19, height 5\' 2"  (1.575 m), weight 89.4 kg, SpO2 100 %.        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

## 2020-04-11 ENCOUNTER — Inpatient Hospital Stay (HOSPITAL_COMMUNITY): Payer: Medicare Other

## 2020-04-11 DIAGNOSIS — D649 Anemia, unspecified: Secondary | ICD-10-CM

## 2020-04-11 DIAGNOSIS — C50919 Malignant neoplasm of unspecified site of unspecified female breast: Secondary | ICD-10-CM

## 2020-04-11 DIAGNOSIS — J910 Malignant pleural effusion: Secondary | ICD-10-CM

## 2020-04-11 LAB — CREATININE, SERUM
Creatinine, Ser: 1.48 mg/dL — ABNORMAL HIGH (ref 0.44–1.00)
GFR calc Af Amer: 39 mL/min — ABNORMAL LOW (ref >60)
GFR calc non Af Amer: 34 mL/min — ABNORMAL LOW (ref >60)

## 2020-04-11 LAB — GLUCOSE, CAPILLARY
Glucose-Capillary: 304 mg/dL — ABNORMAL HIGH (ref 70–99)
Glucose-Capillary: 364 mg/dL — ABNORMAL HIGH (ref 70–99)
Glucose-Capillary: 284 mg/dL — ABNORMAL HIGH (ref 70–99)
Glucose-Capillary: 235 mg/dL — ABNORMAL HIGH (ref 70–99)

## 2020-04-11 MED ORDER — ENOXAPARIN SODIUM 40 MG/0.4ML ~~LOC~~ SOLN
40.0000 mg | SUBCUTANEOUS | Status: DC
  Administered 2020-04-13: 40 mg via SUBCUTANEOUS
  Filled 2020-04-11: qty 0.4

## 2020-04-11 MED ORDER — BISACODYL 5 MG PO TBEC
10.0000 mg | DELAYED_RELEASE_TABLET | Freq: Once | ORAL | Status: AC
  Administered 2020-04-11: 10 mg via ORAL
  Filled 2020-04-11: qty 2

## 2020-04-11 MED ORDER — INSULIN GLARGINE 100 UNIT/ML ~~LOC~~ SOLN
32.0000 [IU] | Freq: Every day | SUBCUTANEOUS | Status: DC
  Administered 2020-04-11 – 2020-04-12 (×2): 32 [IU] via SUBCUTANEOUS
  Filled 2020-04-11 (×4): qty 0.32

## 2020-04-11 NOTE — Progress Notes (Addendum)
The daughter-in-law [name], brought the medication Xeloda, to [facility] [address].  The medication was counted with the patient present. The medication is going to be placed in [facility]. The receipt from the pharmacy will be placed on the patient's shadow chart.

## 2020-04-11 NOTE — Progress Notes (Signed)
Patient was asking about taking the Chemo medication Xeloda. I saw from Dr Ernestina Penna notes that the Xeloda will be on hold until Monday July 12th.

## 2020-04-11 NOTE — Progress Notes (Addendum)
PROGRESS NOTE    Wendy [name]  XBM:[id number]  DOB: 21-Nov-1940  PCP: Marin Olp, MD Admit date:04/08/2020 Chief compliant: Shortness of breath and AMS 79 y.o.femalewith medical history significant ofmetastatic breast cancer on chemotherapy, hypertension, stage III CKD, type 2 diabetes, chronic anemia, major depression, anxiety, tobacco use,presents in the emergency department via EMS for shortness of breath. Patient was recently hospitalized on March 22, 2020 for community-acquired pneumonia. Patient uses oxygen at baseline at 4 L via nasal cannula saturating 92%.She was found confused and had significant difficulty breathing at the facility.Her O2 saturation was 50% on4Lwhich improved to 70% when her oxygen was increased to 6 L. After EMS arrived,patient was found hypothermic,she was placed on 10 L oxygen via nonrebreather. Daughter reports that she appeared to be confused and suspect this could be because of her hypoxia. ED Course: AfebrileCXR:showedModerate right pleural effusion and bibasilar atelectais.There are pulmonary nodular lesions in the upper lobes, larger on the right than on the left, much better seen on recent CT (6/30), consistent with underlying metastatic disease. During previous hospitalization she wasfound to have the pleural effusion but did not have significant effusion at the timeof dischargeto have a Pleurx catheter placed. Hospital course: Patient admitted to [facility] for further management.Plan: Pleurx catheter by IR7/12.  Subjective:  Patient returned from Pleurx catheter placement through IR.  Complaining of pain unrelieved by oral Vicodin/tramadol.  Son and his fianc (who is a Web designer here at [employer]) at bedside.   Objective: Vitals:   04/12/20 0910 04/12/20 0915 04/12/20 0926 04/12/20 1304  BP: 120/70 123/60 134/68 (!) 103/56  Pulse: 66 69  61  Resp: 15 15  16   Temp:    97.7 F (36.5 C)  TempSrc:    Oral  SpO2: 97% 99%  100%  Weight:       Height:        Intake/Output Summary (Last 24 hours) at 04/12/2020 1500 Last data filed at 04/12/2020 1444 Gross per 24 hour  Intake 360 ml  Output 1300 ml  Net -940 ml   Filed Weights   04/08/20 1227 04/10/20 0418  Weight: 94.8 kg 89.4 kg    Physical Examination: General: Moderately built, no acute distress noted Head ENT: Atraumatic normocephalic, PERRLA, neck supple Heart: S1-S2 heard, regular rate and rhythm, no murmurs.  No leg edema noted Lungs: Equal air entry bilaterally, no rhonchi or rales on exam, no accessory muscle use Abdomen: Bowel sounds heard, soft, nontender, nondistended. No organomegaly.  No CVA tenderness Extremities: No pedal edema.  No cyanosis or clubbing. Neurological: Awake alert oriented x3, no focal weakness or numbness, strength and sensations to crude touch intact Skin: No wounds or rashes.  Data Reviewed: I have personally reviewed following labs and imaging studies  CBC: Recent Labs  Lab 04/07/20 1103 04/08/20 1532 04/09/20 0445 04/10/20 0536 04/12/20 0549  WBC 7.8 8.4 8.3 7.3 7.2  NEUTROABS 6.0 7.5  --   --  4.9  HGB 8.6* 9.1* 8.0* 7.6* 7.9*  HCT 25.8* 27.3* 23.5* 22.2* 23.3*  MCV 90.8 89.5 89.4 88.1 90.3  PLT 149* 174 160 150 027*   Basic Metabolic Panel: Recent Labs  Lab 04/07/20 1103 04/07/20 1103 04/08/20 1532 04/09/20 0445 04/09/20 1811 04/10/20 0536 04/11/20 0553 04/12/20 0549  NA 142  --  140  --   --  136  --  137  K 4.2  --  4.4  --   --  4.0  --  3.7  CL 99  --  96*  --   --  94*  --  96*  CO2 36*  --  32  --   --  30  --  32  GLUCOSE 141*  --  278*  --  458* 441*  --  133*  BUN 11  --  18  --   --  38*  --  31*  CREATININE 1.56*   < > 1.91* 2.03*  --  1.76* 1.48* 1.33*  CALCIUM 9.1  --  8.7*  --   --  7.8*  --  7.9*  MG  --   --   --   --   --  2.0  --   --   PHOS  --   --   --   --   --  3.3  --   --    < > = values in this interval not displayed.   GFR: Estimated Creatinine Clearance: 36.2 mL/min (A) (by  C-G formula based on SCr of 1.33 mg/dL (H)). Liver Function Tests: Recent Labs  Lab 04/07/20 1103 04/12/20 0549  AST 11* 10*  ALT 6 9  ALKPHOS 121 96  BILITOT 0.5 0.6  PROT 6.4* 5.7*  ALBUMIN 3.2* 3.1*   No results for input(s): LIPASE, AMYLASE in the last 168 hours. No results for input(s): AMMONIA in the last 168 hours. Coagulation Profile: Recent Labs  Lab 04/12/20 0549  INR 1.2   Cardiac Enzymes: No results for input(s): CKTOTAL, CKMB, CKMBINDEX, TROPONINI in the last 168 hours. BNP (last 3 results) Recent Labs    09/15/19 1552  PROBNP CANCELED   HbA1C: No results for input(s): HGBA1C in the last 72 hours. CBG: Recent Labs  Lab 04/11/20 1202 04/11/20 1635 04/11/20 2218 04/12/20 0802 04/12/20 1125  GLUCAP 364* 284* 235* 103* 95   Lipid Profile: No results for input(s): CHOL, HDL, LDLCALC, TRIG, CHOLHDL, LDLDIRECT in the last 72 hours. Thyroid Function Tests: No results for input(s): TSH, T4TOTAL, FREET4, T3FREE, THYROIDAB in the last 72 hours. Anemia Panel: No results for input(s): VITAMINB12, FOLATE, FERRITIN, TIBC, IRON, RETICCTPCT in the last 72 hours. Sepsis Labs: Recent Labs  Lab 04/08/20 1325 04/08/20 1532 04/08/20 1939 04/09/20 0445 04/10/20 0536  PROCALCITON  --   --  0.14 0.19 0.14  LATICACIDVEN 1.4 1.3  --   --   --     Recent Results (from the past 240 hour(s))  Blood culture (routine x 2)     Status: None (Preliminary result)   Collection Time: 04/08/20 12:58 PM   Specimen: BLOOD  Result Value Ref Range Status   Specimen Description   Final    BLOOD RIGHT ARM Performed at [facility] [address] [place], [place] [address]    Special Requests   Final    BOTTLES DRAWN AEROBIC AND ANAEROBIC Blood Culture adequate volume Performed at [facility] [address] [place], [place] [address]    Culture   Final    NO GROWTH 4 DAYS Performed at [facility], [place] [address]  [place], [place] [address]    Report Status PENDING  Incomplete  Urine culture     Status: None   Collection Time: 04/08/20  1:53 PM   Specimen: Urine, Catheterized  Result Value Ref Range Status   Specimen Description   Final    URINE, CATHETERIZED Performed at [facility] [address], [place], [place] [address]    Special Requests  Final    NONE Performed at [facility], [place] [address] [place], [place] [address]    Culture   Final    NO GROWTH Performed at [facility], [place] [address] [place], [place] [address]    Report Status 04/09/2020 FINAL  Final  SARS Coronavirus 2 by RT PCR (hospital order, performed in [facility] hospital lab) Nasopharyngeal Nasopharyngeal Swab     Status: None   Collection Time: 04/08/20  6:27 PM   Specimen: Nasopharyngeal Swab  Result Value Ref Range Status   SARS Coronavirus 2 NEGATIVE NEGATIVE Final    Comment: (NOTE) SARS-CoV-2 target nucleic acids are NOT DETECTED.  The SARS-CoV-2 RNA is generally detectable in upper and lower respiratory specimens during the acute phase of infection. The lowest concentration of SARS-CoV-2 viral copies this assay can detect is 250 copies / mL. A negative result does not preclude SARS-CoV-2 infection and should not be used as the sole basis for treatment or other patient management decisions.  A negative result may occur with improper specimen collection / handling, submission of specimen other than nasopharyngeal swab, presence of viral mutation(s) within the areas targeted by this assay, and inadequate number of viral copies (<250 copies / mL). A negative result must be combined with clinical observations, patient history, and epidemiological information.  Fact Sheet for Patients:   StrictlyIdeas.no  Fact Sheet for Healthcare Providers: BankingDealers.co.za  This test is not yet approved or  cleared by the  [place] FDA and has been authorized for detection and/or diagnosis of SARS-CoV-2 by FDA under an Emergency Use Authorization (EUA).  This EUA will remain in effect (meaning this test can be used) for the duration of the COVID-19 declaration under Section 564(b)(1) of the Act, 21 U.S.C. section 360bbb-3(b)(1), unless the authorization is terminated or revoked sooner.  Performed at [facility], [place] [address], [place], [place] [address]   Blood culture (routine x 2)     Status: None (Preliminary result)   Collection Time: 04/08/20  9:04 PM   Specimen: BLOOD RIGHT HAND  Result Value Ref Range Status   Specimen Description   Final    BLOOD RIGHT HAND Performed at [facility] [address], [place], [place] [address]    Special Requests   Final    BOTTLES DRAWN AEROBIC ONLY Blood Culture adequate volume Performed at [facility] [address] [place], [place] [address]    Culture   Final    NO GROWTH 4 DAYS Performed at [facility], [place] [address] [place], [place] [address]    Report Status PENDING  Incomplete  MRSA PCR Screening     Status: None   Collection Time: 04/09/20  9:29 AM   Specimen: Nasal Mucosa; Nasopharyngeal  Result Value Ref Range Status   MRSA by PCR NEGATIVE NEGATIVE Final    Comment:        The GeneXpert MRSA Assay (FDA approved for NASAL specimens only), is one component of a comprehensive MRSA colonization surveillance program. It is not intended to diagnose MRSA infection nor to guide or monitor treatment for MRSA infections. Performed at [facility], [place] [address] [place], [place] [address]       Radiology Studies: DG CHEST PORT 1 VIEW  Result Date: 04/11/2020 CLINICAL DATA:  Pleural effusion EXAM: PORTABLE CHEST 1 VIEW COMPARISON:  April 08, 2020 chest radiograph; chest CT March 22, 2020 FINDINGS: Pleural effusion again noted  on the right. There are  areas of atelectatic change in the lung bases. Nodular lesions are seen in the right upper lobe and to a lesser degree in the left upper lobe, better seen on recent CT. There is patchy airspace opacity in the right perihilar region as well, similar to recent CT. Heart is borderline enlarged with pulmonary vascularity normal. No convincing adenopathy by radiography. No evident bone lesions. IMPRESSION: Persistent right pleural effusion. Bibasilar atelectasis. Ill-defined airspace opacity in the right perihilar region. There are pulmonary nodular lesions in the upper lobes, larger on the right than on the left, much better seen on recent CT, consistent with underlying metastatic disease. Stable cardiac silhouette. Electronically Signed   By: Lowella Grip III M.D.   On: 04/11/2020 07:57   IR PERC PLEURAL DRAIN W/INDWELL CATH W/IMG GUIDE  Result Date: 04/12/2020 INDICATION: 79 year old female with a history of metastatic breast cancer and malignant bilateral right larger than left pleural effusions. She presents for placement of a tunneled pleural drainage catheter on the right for palliative management of her recurrent pleural effusion. EXAM: Tunneled pleural drainage catheter placement MEDICATIONS: 2 g Ancef intravenous ANESTHESIA/SEDATION: Fentanyl 75 mcg IV; Versed 1.5 mg IV Moderate Sedation Time:  21 minutes The patient was continuously monitored during the procedure by the interventional radiology nurse under my direct supervision. COMPLICATIONS: None immediate. PROCEDURE: Informed written consent was obtained from the patient after a thorough discussion of the procedural risks, benefits and alternatives. All questions were addressed. Maximal Sterile Barrier Technique was utilized including caps, mask, sterile gowns, sterile gloves, sterile drape, hand hygiene and skin antiseptic. A timeout was performed prior to the initiation of the procedure. The right lateral chest was interrogated with ultrasound. A  small to moderate residual pleural effusion was identified. A suitable skin entry site was selected and marked. Local anesthesia was attained by infiltration with 1% lidocaine. A small dermatotomy was made. Under real-time ultrasound guidance, an 18 gauge sheath needle was carefully advanced over a rib and into the pleural space. The needle portion was removed. A 0 point 3 5 Amplatz wire was advanced through the sheath and into the pleural space. The wire was secured. A suitable skin exit site approximately 5 cm medial and inferior to the pleural entry site was identified. Local anesthesia was again attained by infiltration with 1% lidocaine. A second dermatotomy was made. The 80 French PleurX drainage catheter was then tunneled from the skin exit site to the dermatotomy overlying the pleural entry site. Attention was turned back to the wire. The 18 French sheath was removed. A 15.5 French peel-away sheath was carefully advanced over the wire and into the pleural space. The wire was removed. The PleurX catheter was advanced through the peel-away sheath and the peel-away sheath was discarded. The catheter was then connected to a vacuum tender and approximately 800 mL of golden pleural fluid was evacuated. Follow-up chest x-ray demonstrates a well-positioned tunneled pleural drainage catheter with resolution of pleural effusion and no evidence of pneumothorax. There is some persistent atelectasis in the region of the right middle lobe. The dermatotomy overlying the pleural access site was then closed with a 3-0 inverted interrupted Vicryl suture. The epidermis was sealed with Dermabond. The catheter was secured to the skin exit site using 0 Prolene suture. Sterile bandages were applied. The patient tolerated the procedure well. IMPRESSION: Successful placement of a right-sided tunneled pleural drainage catheter. Electronically Signed   By: Jacqulynn Cadet M.D.   On: 04/12/2020 11:11  Scheduled Meds: .  albuterol  2.5 mg Nebulization BID  . amLODipine  10 mg Oral Daily  . busPIRone  10 mg Oral TID  . carvedilol  25 mg Oral BID WC  . cloNIDine  0.3 mg Oral BID  . docusate sodium  100 mg Oral BID  . [START ON 04/13/2020] enoxaparin (LOVENOX) injection  40 mg Subcutaneous Q24H  . famotidine  20 mg Oral Daily  . fluticasone furoate-vilanterol  1 puff Inhalation Daily  . furosemide  20 mg Oral Daily  . guaiFENesin  600 mg Oral BID  . insulin aspart  0-20 Units Subcutaneous TID WC  . insulin aspart  0-5 Units Subcutaneous QHS  . insulin glargine  32 Units Subcutaneous QHS  . montelukast  10 mg Oral QHS  . pravastatin  20 mg Oral q1800   Continuous Infusions: . ceFAZolin        Assessment/Plan:  Acute on chronichypoxicrespiratory failure: Multifactorial. POA and now improving. Likely secondary to pleural effusion and atelectasis. Right perihilar infiltrate reported along with metastatic disease on CXR. Symptoms likely secondary to malignantpleural effusion, other differentials considered are CHF, pneumonia, may also have COPD component:O2 saturationon arrival 70%on 6 L,placed on non rebreatherbriefly.Continue supplemental oxygen to maintain saturation above 94%.Her breathing has significantly improved after she received Solu-Medrol 125 mgonce.She is weaned down to 4 L supplemental oxygen saturating>90%. Repeat CXR on 7/11 with similar findings. Continue DuoNeb,Solu-Medrol 40 every 12hr and Lasix 20 mg daily. Covid test, proBNP,procalcitonin levelnormal.Echo6/21:Left ventricular EF 55 to 60%. Pulmonology consulted,recommendedDC antibiotics, underwent Pluerix catheter  placement on the right side on 7/12 through IR.  Tolerated well.  Discussed at length with family regarding Pleurx catheter management at home. Patient follows Dr. Vaughan Browner for pulmonary as outpatient and is followed by [facility] care as well for palliative management.  She is complaining of acute pain unrelieved by oral  narcotics at the site of Pleurx catheter, will give IV morphine x1.  History of metastatic breast cancer: Now with unilateral pleural effusion Continue chemotherapy.Outpatientoncologyfollow up.  Acute kidney injury on CKD stage ATF:TDDUKGURKY 1.9 on presentation, peaked to 2.0 and now improved to 1.3 (baseline creatinine1.4) after holding HCTZ/lisinopril.remains on Lasix 20 mg daily and tolerating well.  Avoid nephrotoxic medications.  Essential hypertension:Continue clonidine, amlodipine, hold lisinopril HCTZ  Diabetes mellitus type 2:Persistent hyperglycemia, increased to hi-dose SSI, increased lantus from 28 to 32 .Continue Lantus as above, sliding scale as above  Acute on chronic anemia: Likely due to chronic blood loss in the setting of malignant effusion as well as chronic anemia secondary tobreast ca and chemotherapy.  Threshold to transfusion would be less than 7.  Hemoglobin currently stable at 7.9 (compared to 7/10)   DVT prophylaxis: Lovenox Code Status: DNR. Family / Patient Communication: Discussed with son and with his fiance at bedside who would be the primary caregivers.  They would like to obtain Pleurx catheter education today and plan to take her home with home health in a.m. Disposition Plan:   Status is: Inpatient  Remains inpatient appropriate because:Ongoing active pain requiring inpatient pain management, family education regarding Pleurx catheter management at home.   Dispo: The patient is from: Home              Anticipated d/c is to: Home              Anticipated d/c date is: 1 day              Patient currently is medically stable to d/c.  Time spent: 35 MIN     >50% time spent in discussions with care team and coordination of care.    Guilford Shi, MD [facility] Pager in [place]  If 7PM-7AM, please contact night-coverage [email or web address] 04/12/2020, 3:00 PM

## 2020-04-11 NOTE — Progress Notes (Signed)
PROGRESS NOTE    Wendy [name]  CNO:[id number] DOB: 23-Aug-1941 DOA: 04/08/2020 PCP: Marin Olp, MD   Brief Narrative:  Wendy Sahota Jonesis a 79 y.o.femalewith medical history significant ofmetastatic breast cancer on chemotherapy, hypertension, stage III CKD, type 2 diabetes, chronic anemia, major depression, anxiety, tobacco use,presents in the emergency department via EMS for shortness of breath. Patient was recently hospitalized on March 22, 2020 for community-acquired pneumonia. Patient uses oxygen at baseline at 4 L via nasal cannula saturating 92%.She was found confused and had significant difficulty breathing at the facility.Her O2 saturation was 50% on4Lwhich improved to 70% when her oxygen was increased to 6 L. After EMS arrived,patient was found hypothermic,she was placed on 10 L oxygen via nonrebreather. Daughter reports that she appeared to be confused and suspect this could be because of her hypoxia.Daughter denies any fever, cough, nausea, vomiting, history of clots, leg swelling, recent travel, sick contacts.QHU:[id number] right bilateral pleural effusions. During previous hospitalization she wasfound to have the pleural effusion but did not have significant effusion at the timeof dischargeto have a Pleurx catheter placed. Plan: Pleurx catheter by IR7/12 ,discharge back to skilled nursing facility.   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 Continue supplemental oxygen to maintain saturation above 94%. Her breathing has significantly improved after she received Solu-Medrol 125 mgonce. She is weaned down to 4 L supplemental oxygen saturating >90%. Chest x-ray shows findings consistent with pulmonary edema,moderate right pleural effusion- repeat cxr in am Continue DuoNeb,Solu-Medrol 40 every 12hr. Pulmonology consulted,recommendedDC antibiotics, needs Pluerix catheter 7/12 Follow blood and urine cultures. proBNP,procalcitonin levelnormal,CovidPCR. Echo6/21:Left ventricular EF 55 to 60%   Bilateral pleural effusion: She appears comfortable after getting Solu-Medrol and breathing treatment. Plan:Pleurx catheter by IR7/12  History of metastatic breast cancer: Continue chemotherapy. Outpatientoncologyfollow up.  Acute kidney injury on CKD stage III: Creatinine 1.9, baseline creatinine1.4 Avoid nephrotoxic medications, Recheck labs tomorrow morning  Essential hypertension: Continue clonidine, amlodipine, hold lisinopril HCTZ  Diabetes mellitus type 2: Persistent hyperglycemia, increased to hi-dose SSI, increased lantus from 28 to 32  Continue Lantus as above, sliding scale as above  Chronic anemia: Could be secondary tobreast ca and chemotherapy  DVT prophylaxis: Lovenox SQ  Code Status: DNR    Code Status Orders  (From admission, onward)         Start     Ordered   04/08/20 1846  Do not attempt resuscitation (DNR)  Continuous       Question Answer Comment  In the event of cardiac or respiratory ARREST Do not call a "code blue"   In the event of cardiac or respiratory ARREST Do not perform Intubation, CPR, defibrillation or ACLS   In the event of  cardiac or respiratory ARREST Use medication by any route, position, wound care, and other measures to relive pain and suffering. May use oxygen, suction and manual treatment of airway obstruction as  needed for comfort.      04/08/20 1845        Code Status History    Date Active Date Inactive Code Status Order ID Comments User Context   03/22/2020 1603 03/25/2020 2034 DNR [id number]  Lequita Halt, MD ED   03/03/2020 1111 03/05/2020 2314 DNR [id number]  Earlie Counts, NP Inpatient   03/02/2020 0619 03/03/2020 1110 Full Code [id number]  Shela Leff, MD Inpatient   Advance Care Planning Activity    Advance Directive Documentation     Most Recent Value  Type of Advance Directive Living will  Pre-existing out of facility DNR order (yellow form or pink MOST form) --  "MOST" Form in Place? --     Family Communication: SON AT BEDSIDE  Disposition Plan:   Status is: Inpatient  Remains inpatient appropriate because:Ongoing diagnostic testing needed not appropriate for outpatient work up   Dispo: The patient is from: Home              Anticipated d/c is to: SNF              Anticipated d/c date is: 1 day              Patient currently is not medically stable to d/c.       Consults called: IR. PCCM Admission status: Inpatient   Consultants:   AS ABOVE  Procedures:  DG Chest 1 View  Result Date: 03/18/2020 CLINICAL DATA:  79 year old female status post right thoracentesis. EXAM: CHEST  1 VIEW COMPARISON:  Chest x-ray 03/02/2020. FINDINGS: No pneumothorax. Small left pleural effusion. No definite right pleural effusion. Patchy multifocal interstitial and airspace disease noted throughout the mid to lower lungs bilaterally. No evidence of pulmonary edema. Heart size is normal. Upper mediastinal contours are within normal limits. Surgical clips in the left axillary region, likely from prior lymph node dissection. IMPRESSION: 1. No pneumothorax in this patient status post thoracentesis.  2. Small left pleural effusion. 3. Multilobar bilateral pneumonia throughout the mid to lower lungs bilaterally. Electronically Signed   By: Vinnie Langton M.D.   On: 03/18/2020 11:21   DG Chest 2 View  Result Date: 04/08/2020 CLINICAL DATA:  Shortness of breath EXAM: CHEST - 2 VIEW COMPARISON:  03/22/2020 radiograph, CT 03/22/2020 FINDINGS: Moderate right greater than left bilateral pleural effusions, increased compared to prior. Worsening airspace disease at both lung bases. Enlarged cardiomediastinal silhouette with vascular congestion and increased pulmonary edema. Pulmonary metastatic nodules not well appreciated likely due to diffuse bilateral airspace disease. No pneumothorax. IMPRESSION: 1. Moderate right greater than left bilateral pleural effusions, increased compared to prior with worsening airspace disease at both lung bases. 2. Cardiomegaly with vascular congestion and increased pulmonary edema. Electronically Signed   By: Donavan Foil M.D.   On: 04/08/2020 15:32   CT Angio Chest PE W and/or Wo Contrast  Result Date: 03/22/2020 CLINICAL DATA:  Shortness of breath, history of metastatic breast cancer and pleural effusions EXAM: CT ANGIOGRAPHY CHEST WITH CONTRAST TECHNIQUE: Multidetector CT imaging of the chest was performed using the standard protocol during bolus administration of intravenous contrast. Multiplanar CT image reconstructions and MIPs were obtained to evaluate the vascular anatomy. CONTRAST:  49mL OMNIPAQUE IOHEXOL 350 MG/ML SOLN COMPARISON:  CT chest angiogram, 03/02/2020 FINDINGS: Cardiovascular: Satisfactory opacification of the pulmonary arteries to the segmental level. No evidence of pulmonary embolism. Normal heart size. Trace pericardial effusion. Mediastinum/Nodes: No enlarged mediastinal, hilar, or axillary lymph nodes. Thyroid gland, trachea, and esophagus demonstrate  no significant findings. Lungs/Pleura: Moderate right, small left pleural effusions increased compared  to prior CT dated 03/02/2020. Numerous bilateral pulmonary nodules. Upper Abdomen: No acute abnormality. Musculoskeletal: Soft tissue mass in the lateral left breast and axilla. No acute or significant osseous findings. Review of the MIP images confirms the above findings. IMPRESSION: 1. Negative examination for pulmonary embolism. 2. Moderate right, small left pleural effusions, increased compared to prior CT dated 03/02/2020. 3. Numerous bilateral pulmonary nodules, in keeping with metastatic breast malignancy. 4. Trace pericardial effusion, unchanged compared to prior examination. 5. Soft tissue mass in the lateral left breast and axilla, in keeping with known breast malignancy. Electronically Signed   By: Eddie Candle M.D.   On: 03/22/2020 15:09   DG CHEST PORT 1 VIEW  Result Date: 04/11/2020 CLINICAL DATA:  Pleural effusion EXAM: PORTABLE CHEST 1 VIEW COMPARISON:  April 08, 2020 chest radiograph; chest CT March 22, 2020 FINDINGS: Pleural effusion again noted on the right. There are areas of atelectatic change in the lung bases. Nodular lesions are seen in the right upper lobe and to a lesser degree in the left upper lobe, better seen on recent CT. There is patchy airspace opacity in the right perihilar region as well, similar to recent CT. Heart is borderline enlarged with pulmonary vascularity normal. No convincing adenopathy by radiography. No evident bone lesions. IMPRESSION: Persistent right pleural effusion. Bibasilar atelectasis. Ill-defined airspace opacity in the right perihilar region. There are pulmonary nodular lesions in the upper lobes, larger on the right than on the left, much better seen on recent CT, consistent with underlying metastatic disease. Stable cardiac silhouette. Electronically Signed   By: Lowella Grip III M.D.   On: 04/11/2020 07:57   DG Chest Portable 1 View  Result Date: 03/22/2020 CLINICAL DATA:  Shortness of breath.  Metastatic breast carcinoma. EXAM: PORTABLE CHEST 1  VIEW COMPARISON:  03/18/2020 FINDINGS: Moderate cardiomegaly appears mildly increased since previous study. Increased diffuse interstitial infiltrates may be due to interstitial edema. New confluent opacity is seen in the right midlung, suspicious for pneumonia. Left lower lobe atelectasis or infiltrate shows no significant change. Probable small bilateral pleural effusions noted. Small pulmonary nodules are noted, but less well visualized than on prior exam. Multiple surgical clips again seen in the left axilla. IMPRESSION: 1. New confluent opacity in right midlung, suspicious for pneumonia. 2. Mild increase in cardiomegaly and diffuse interstitial infiltrates, suspicious for congestive heart failure. 3. Stable left lower lobe atelectasis versus infiltrate. Probable small bilateral pleural effusions. 4. Pulmonary metastases are less well visualized than on previous study. Electronically Signed   By: Marlaine Hind M.D.   On: 03/22/2020 12:03   ECHOCARDIOGRAM LIMITED  Result Date: 03/23/2020    ECHOCARDIOGRAM REPORT   Patient Name:   ASHLEYMARIE GRANDERSON Date of Exam: 03/23/2020 Medical Rec #:  [id number]    Height:       62.0 in Accession #:    [id number]   Weight:       200.4 lb Date of Birth:  08-Jun-1941    BSA:          1.913 m Patient Age:    73 years     BP:           163/101 mmHg Patient Gender: F            HR:           100 bpm. Exam Location:  Inpatient Procedure: Limited Echo, Cardiac Doppler and Color Doppler Indications:  Pericardial effusion 423.9 / I31.3  History:         Patient has prior history of Echocardiogram examinations, most                  recent 02/05/2020. Risk Factors:Diabetes, Hypertension and                  Dyslipidemia. Multiple metastatic breast cancer, ,metastatic                  pleural effusion, GERD, Chronic kidney disease.  Sonographer:     Darlina Sicilian RDCS Referring Phys:  [id number] Lequita Halt Diagnosing Phys: Fransico Him MD IMPRESSIONS  1. Left ventricular ejection fraction,  by estimation, is 55 to 60%. The left ventricle has normal function. Left ventricular endocardial border not optimally defined to evaluate regional wall motion. Left ventricular diastolic function could not be evaluated.  2. Right ventricular systolic function was not well visualized. The right ventricular size is not well visualized. There is moderately elevated pulmonary artery systolic pressure. The estimated right ventricular systolic pressure is 86.7 mmHg.  3. The mitral valve is normal in structure. No evidence of mitral valve regurgitation. No evidence of mitral stenosis.  4. The aortic valve is normal in structure. Aortic valve regurgitation is not visualized. No aortic stenosis is present.  5. The inferior vena cava is dilated in size with >50% respiratory variability, suggesting right atrial pressure of 8 mmHg.  6. A small pericardial effusion is present. The pericardial effusion is circumferential. There is no evidence of cardiac tamponade. FINDINGS  Left Ventricle: Left ventricular ejection fraction, by estimation, is 55 to 60%. The left ventricle has normal function. Left ventricular endocardial border not optimally defined to evaluate regional wall motion. The left ventricular internal cavity size was normal in size. There is no left ventricular hypertrophy. Left ventricular diastolic function could not be evaluated. Right Ventricle: The right ventricular size is not well visualized. Right vetricular wall thickness was not assessed. Right ventricular systolic function was not well visualized. There is moderately elevated pulmonary artery systolic pressure. The tricuspid regurgitant velocity is 2.85 m/s, and with an assumed right atrial pressure of 8 mmHg, the estimated right ventricular systolic pressure is 67.2 mmHg. Left Atrium: Left atrial size was normal in size. Right Atrium: Right atrial size was normal in size. Pericardium: A small pericardial effusion is present. The pericardial effusion is  circumferential. There is no evidence of cardiac tamponade. Mitral Valve: The mitral valve is normal in structure. There is mild calcification of the anterior mitral valve leaflet(s). Normal mobility of the mitral valve leaflets. Mild mitral annular calcification. No evidence of mitral valve regurgitation. No evidence of mitral valve stenosis. Tricuspid Valve: The tricuspid valve is normal in structure. Tricuspid valve regurgitation is mild . No evidence of tricuspid stenosis. Aortic Valve: The aortic valve is normal in structure. Aortic valve regurgitation is not visualized. No aortic stenosis is present. Pulmonic Valve: The pulmonic valve was normal in structure. Pulmonic valve regurgitation is not visualized. No evidence of pulmonic stenosis. Aorta: The aortic root is normal in size and structure. Venous: The inferior vena cava is dilated in size with greater than 50% respiratory variability, suggesting right atrial pressure of 8 mmHg. IAS/Shunts: No atrial level shunt detected by color flow Doppler.  LEFT VENTRICLE PLAX 2D LVIDd:         4.00 cm LVIDs:         2.90 cm LV PW:  1.00 cm LV IVS:        1.10 cm LVOT diam:     2.00 cm LVOT Area:     3.14 cm  LV Volumes (MOD) LV vol d, MOD A2C: 41.1 ml LV vol d, MOD A4C: 66.2 ml LV vol s, MOD A2C: 24.2 ml LV vol s, MOD A4C: 27.8 ml LV SV MOD A2C:     16.9 ml LV SV MOD A4C:     66.2 ml LV SV MOD BP:      28.0 ml LEFT ATRIUM         Index LA diam:    3.70 cm 1.93 cm/m   AORTA Ao Root diam: 3.20 cm TRICUSPID VALVE TR Peak grad:   32.5 mmHg TR Vmax:        285.00 cm/s  SHUNTS Systemic Diam: 2.00 cm Fransico Him MD Electronically signed by Fransico Him MD Signature Date/Time: 03/23/2020/3:43:48 PM    Final (Updated)    US Thoracentesis Asp Pleural space w/IMG guide  Result Date: 03/18/2020 INDICATION: Patient with history of metastatic breast cancer, dyspnea, recurrent right pleural effusion. Request made for therapeutic right thoracentesis. EXAM: ULTRASOUND  GUIDED THERAPEUTIC RIGHT THORACENTESIS MEDICATIONS: None COMPLICATIONS: None immediate. PROCEDURE: An ultrasound guided thoracentesis was thoroughly discussed with the patient and questions answered. The benefits, risks, alternatives and complications were also discussed. The patient understands and wishes to proceed with the procedure. Written consent was obtained. Ultrasound was performed to localize and mark an adequate pocket of fluid in the right chest. The area was then prepped and draped in the normal sterile fashion. 1% Lidocaine was used for local anesthesia. Under ultrasound guidance a 6 Fr Safe-T-Centesis catheter was introduced. Thoracentesis was performed. The catheter was removed and a dressing applied. FINDINGS: A total of approximately 400 cc of slightly hazy, yellow fluid was removed. IMPRESSION: Successful ultrasound guided therapeutic right thoracentesis yielding 400 cc of pleural fluid. Read by: Rowe Robert, PA-C Electronically Signed   By: Corrie Mckusick D.O.   On: 03/18/2020 10:53     Antimicrobials:   NONE    Subjective: Patient reports breathing stable unchanged from prior No acute hypoxic events overnight "Ready" for Pleurx catheter Monday  Objective: Vitals:   04/10/20 1956 04/10/20 2014 04/11/20 0433 04/11/20 1051  BP: (!) 121/59  127/64 (!) 132/54  Pulse: 75  68 75  Resp: 20  18   Temp: 98.5 F (36.9 C)  98.2 F (36.8 C)   TempSrc:      SpO2: 100% 99% 100% 100%  Weight:      Height:        Intake/Output Summary (Last 24 hours) at 04/11/2020 1106 Last data filed at 04/11/2020 1104 Gross per 24 hour  Intake 660 ml  Output 1450 ml  Net -790 ml   Filed Weights   04/08/20 1227 04/10/20 0418  Weight: 94.8 kg 89.4 kg    Examination:  General exam: Appears calm and comfortable  Respiratory system: TRACE RALES/RHONCHI Respiratory effort normal. Cardiovascular system: S1 & S2 heard, RRR. No JVD, murmurs, rubs, gallops or clicks. No pedal  edema. Gastrointestinal system: Abdomen is nondistended, soft and nontender. No organomegaly or masses felt. Normal bowel sounds heard. Central nervous system: Alert and oriented. No focal neurological deficits. Extremities: Thin cachectic globally weak no focal findings Skin: No draining lesions Psychiatry: Judgement and insight appear normal. Mood & affect appropriate.     Data Reviewed: I have personally reviewed following labs and imaging studies  CBC: Recent Labs  Lab 04/07/20 1103 04/08/20 1532 04/09/20 0445 04/10/20 0536  WBC 7.8 8.4 8.3 7.3  NEUTROABS 6.0 7.5  --   --   HGB 8.6* 9.1* 8.0* 7.6*  HCT 25.8* 27.3* 23.5* 22.2*  MCV 90.8 89.5 89.4 88.1  PLT 149* 174 160 485   Basic Metabolic Panel: Recent Labs  Lab 04/07/20 1103 04/08/20 1532 04/09/20 0445 04/09/20 1811 04/10/20 0536 04/11/20 0553  NA 142 140  --   --  136  --   K 4.2 4.4  --   --  4.0  --   CL 99 96*  --   --  94*  --   CO2 36* 32  --   --  30  --   GLUCOSE 141* 278*  --  458* 441*  --   BUN 11 18  --   --  38*  --   CREATININE 1.56* 1.91* 2.03*  --  1.76* 1.48*  CALCIUM 9.1 8.7*  --   --  7.8*  --   MG  --   --   --   --  2.0  --   PHOS  --   --   --   --  3.3  --    GFR: Estimated Creatinine Clearance: 32.5 mL/min (A) (by C-G formula based on SCr of 1.48 mg/dL (H)). Liver Function Tests: Recent Labs  Lab 04/07/20 1103  AST 11*  ALT 6  ALKPHOS 121  BILITOT 0.5  PROT 6.4*  ALBUMIN 3.2*   No results for input(s): LIPASE, AMYLASE in the last 168 hours. No results for input(s): AMMONIA in the last 168 hours. Coagulation Profile: No results for input(s): INR, PROTIME in the last 168 hours. Cardiac Enzymes: No results for input(s): CKTOTAL, CKMB, CKMBINDEX, TROPONINI in the last 168 hours. BNP (last 3 results) Recent Labs    09/15/19 1552  PROBNP CANCELED   HbA1C: No results for input(s): HGBA1C in the last 72 hours. CBG: Recent Labs  Lab 04/10/20 0745 04/10/20 1110  04/10/20 1608 04/10/20 2046 04/11/20 0734  GLUCAP 421* 415* 443* 345* 304*   Lipid Profile: No results for input(s): CHOL, HDL, LDLCALC, TRIG, CHOLHDL, LDLDIRECT in the last 72 hours. Thyroid Function Tests: Recent Labs    04/08/20 1325  TSH 9.457*   Anemia Panel: No results for input(s): VITAMINB12, FOLATE, FERRITIN, TIBC, IRON, RETICCTPCT in the last 72 hours. Sepsis Labs: Recent Labs  Lab 04/08/20 1325 04/08/20 1532 04/08/20 1939 04/09/20 0445 04/10/20 0536  PROCALCITON  --   --  0.14 0.19 0.14  LATICACIDVEN 1.4 1.3  --   --   --     Recent Results (from the past 240 hour(s))  Blood culture (routine x 2)     Status: None (Preliminary result)   Collection Time: 04/08/20 12:58 PM   Specimen: BLOOD  Result Value Ref Range Status   Specimen Description   Final    BLOOD RIGHT ARM Performed at [facility] [address] [place], [place] [address]    Special Requests   Final    BOTTLES DRAWN AEROBIC AND ANAEROBIC Blood Culture adequate volume Performed at [facility] [address] [place], [place] [address]    Culture   Final    NO GROWTH 2 DAYS Performed at [facility] [address] [place], [place] [address]    Report Status PENDING  Incomplete  Urine culture     Status: None   Collection Time: 04/08/20  1:53 PM   Specimen: Urine, Catheterized  Result Value Ref Range Status   Specimen Description   Final    URINE, CATHETERIZED Performed at [facility] [address], [place], [place] [address]    Special Requests   Final    NONE Performed at [facility], [place] [address] [place], [place] [address]    Culture   Final    NO GROWTH Performed at [facility], [place] [address] [place], [place] [address]    Report Status 04/09/2020 FINAL  Final  SARS Coronavirus 2 by RT PCR (hospital order, performed in [facility] hospital lab) Nasopharyngeal Nasopharyngeal Swab      Status: None   Collection Time: 04/08/20  6:27 PM   Specimen: Nasopharyngeal Swab  Result Value Ref Range Status   SARS Coronavirus 2 NEGATIVE NEGATIVE Final    Comment: (NOTE) SARS-CoV-2 target nucleic acids are NOT DETECTED.  The SARS-CoV-2 RNA is generally detectable in upper and lower respiratory specimens during the acute phase of infection. The lowest concentration of SARS-CoV-2 viral copies this assay can detect is 250 copies / mL. A negative result does not preclude SARS-CoV-2 infection and should not be used as the sole basis for treatment or other patient management decisions.  A negative result may occur with improper specimen collection / handling, submission of specimen other than nasopharyngeal swab, presence of viral mutation(s) within the areas targeted by this assay, and inadequate number of viral copies (<250 copies / mL). A negative result must be combined with clinical observations, patient history, and epidemiological information.  Fact Sheet for Patients:   StrictlyIdeas.no  Fact Sheet for Healthcare Providers: BankingDealers.co.za  This test is not yet approved or  cleared by the [place] FDA and has been authorized for detection and/or diagnosis of SARS-CoV-2 by FDA under an Emergency Use Authorization (EUA).  This EUA will remain in effect (meaning this test can be used) for the duration of the COVID-19 declaration under Section 564(b)(1) of the Act, 21 U.S.C. section 360bbb-3(b)(1), unless the authorization is terminated or revoked sooner.  Performed at [facility], [place] [address] [place], [place] [address]   Blood culture (routine x 2)     Status: None (Preliminary result)   Collection Time: 04/08/20  9:04 PM   Specimen: BLOOD RIGHT HAND  Result Value Ref Range Status   Specimen Description   Final    BLOOD RIGHT HAND Performed at [facility]  [address], [place], [place] [address]    Special Requests   Final    BOTTLES DRAWN AEROBIC ONLY Blood Culture adequate volume Performed at [facility] [address] [place], [place] [address]    Culture   Final    NO GROWTH 2 DAYS Performed at [facility] [address] [place], [place] [address]    Report Status PENDING  Incomplete  MRSA PCR Screening     Status: None   Collection Time: 04/09/20  9:29 AM   Specimen: Nasal Mucosa; Nasopharyngeal  Result Value Ref Range Status   MRSA by PCR NEGATIVE NEGATIVE Final    Comment:        The GeneXpert MRSA Assay (FDA approved for NASAL specimens only), is one component of a comprehensive MRSA colonization surveillance program. It is not intended to diagnose MRSA infection nor to guide or monitor treatment for MRSA infections. Performed at [facility], [place] [address] [place],  [place] [address]          Radiology Studies: DG CHEST PORT 1 VIEW  Result Date: 04/11/2020 CLINICAL DATA:  Pleural effusion EXAM: PORTABLE CHEST 1 VIEW COMPARISON:  April 08, 2020 chest radiograph; chest CT March 22, 2020 FINDINGS: Pleural effusion again noted on the right. There are areas of atelectatic change in the lung bases. Nodular lesions are seen in the right upper lobe and to a lesser degree in the left upper lobe, better seen on recent CT. There is patchy airspace opacity in the right perihilar region as well, similar to recent CT. Heart is borderline enlarged with pulmonary vascularity normal. No convincing adenopathy by radiography. No evident bone lesions. IMPRESSION: Persistent right pleural effusion. Bibasilar atelectasis. Ill-defined airspace opacity in the right perihilar region. There are pulmonary nodular lesions in the upper lobes, larger on the right than on the left, much better seen on recent CT, consistent with underlying metastatic disease. Stable cardiac silhouette. Electronically Signed    By: Lowella Grip III M.D.   On: 04/11/2020 07:57        Scheduled Meds: . albuterol  2.5 mg Nebulization BID  . amLODipine  10 mg Oral Daily  . busPIRone  10 mg Oral TID  . carvedilol  25 mg Oral BID WC  . cloNIDine  0.3 mg Oral BID  . docusate sodium  100 mg Oral BID  . enoxaparin (LOVENOX) injection  30 mg Subcutaneous Q24H  . famotidine  20 mg Oral Daily  . fluticasone furoate-vilanterol  1 puff Inhalation Daily  . furosemide  20 mg Oral Daily  . guaiFENesin  600 mg Oral BID  . insulin aspart  0-20 Units Subcutaneous TID WC  . insulin aspart  0-5 Units Subcutaneous QHS  . insulin glargine  32 Units Subcutaneous QHS  . montelukast  10 mg Oral QHS  . pravastatin  20 mg Oral q1800   Continuous Infusions:   LOS: 3 days    Time spent: [place], MD [facility]  If 7PM-7AM, please contact night-coverage  04/11/2020, 11:06 AM

## 2020-04-11 NOTE — Progress Notes (Signed)
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 Abnormal breast finding 2018   per pt/ having  a lot drainage from left breast nipple  . Anemia   . Anxiety   . Breast cancer ([place]) 11/2016   left/  . CKD (chronic kidney disease)   . Depression   . GERD (gastroesophageal reflux disease)    TUMS as needed  . HTN (hypertension)    states BP has been high recently; has been on med. x 20 yr.  . Hyperlipidemia   . Hyperplastic colon polyp   . HYPERTENSION, BENIGN SYSTEMIC 11/29/2006   Lisinopril hctz 10-12.5mg , amlodipine 10mg , coreg 25mg  BID, clonidine 0.3 mg BID.   Marland Kitchen Insulin dependent diabetes mellitus   . Personal history of chemotherapy    2018/finished 6 weeks of chemo in Sep 2018  . Personal history of radiation therapy    2018 left breast/finished radiation in Sept 2018 per pt.     Significant Hospital Events   Feeling better  Consults:  PCCM   Procedures:  none  Significant Diagnostic Tests:  Chest x-ray 04/08/2020 -Bilateral pleural effusion worse on the right Chest x-ray 04/12/19/2021 -Reviewed by myself, bilateral pleural effusion  worse on the right, nodular changes in the lungs  Micro Data:  Blood culture 7/8 Urine culture 7/8 MRSA PCR negative  Antimicrobials:  Cefepime 7/8 Vancomycin 7/8  Interim history/subjective:  No overnight events, complaining of constipation  Objective   Blood pressure 127/64, pulse 68, temperature 98.2 F (36.8 C), resp. rate 18, height 5\' 2"  (1.575 m), weight 89.4 kg, SpO2 100 %.        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

## 2020-04-12 ENCOUNTER — Inpatient Hospital Stay (HOSPITAL_COMMUNITY): Payer: Medicare Other

## 2020-04-12 DIAGNOSIS — Z794 Long term (current) use of insulin: Secondary | ICD-10-CM

## 2020-04-12 DIAGNOSIS — E113419 Type 2 diabetes mellitus with severe nonproliferative diabetic retinopathy with macular edema, unspecified eye: Secondary | ICD-10-CM

## 2020-04-12 DIAGNOSIS — E1165 Type 2 diabetes mellitus with hyperglycemia: Secondary | ICD-10-CM

## 2020-04-12 DIAGNOSIS — D62 Acute posthemorrhagic anemia: Secondary | ICD-10-CM

## 2020-04-12 HISTORY — PX: IR PERC PLEURAL DRAIN W/INDWELL CATH W/IMG GUIDE: IMG5383

## 2020-04-12 LAB — COMPREHENSIVE METABOLIC PANEL
ALT: 9 U/L (ref 0–44)
AST: 10 U/L — ABNORMAL LOW (ref 15–41)
Albumin: 3.1 g/dL — ABNORMAL LOW (ref 3.5–5.0)
Alkaline Phosphatase: 96 U/L (ref 38–126)
Anion gap: 9 (ref 5–15)
BUN: 31 mg/dL — ABNORMAL HIGH (ref 8–23)
CO2: 32 mmol/L (ref 22–32)
Calcium: 7.9 mg/dL — ABNORMAL LOW (ref 8.9–10.3)
Chloride: 96 mmol/L — ABNORMAL LOW (ref 98–111)
Creatinine, Ser: 1.33 mg/dL — ABNORMAL HIGH (ref 0.44–1.00)
GFR calc Af Amer: 44 mL/min — ABNORMAL LOW (ref >60)
GFR calc non Af Amer: 38 mL/min — ABNORMAL LOW (ref >60)
Glucose, Bld: 133 mg/dL — ABNORMAL HIGH (ref 70–99)
Potassium: 3.7 mmol/L (ref 3.5–5.1)
Sodium: 137 mmol/L (ref 135–145)
Total Bilirubin: 0.6 mg/dL (ref 0.3–1.2)
Total Protein: 5.7 g/dL — ABNORMAL LOW (ref 6.5–8.1)

## 2020-04-12 LAB — CBC WITH DIFFERENTIAL/PLATELET
Abs Immature Granulocytes: 0.07 10*3/uL (ref 0.00–0.07)
Basophils Absolute: 0 10*3/uL (ref 0.0–0.1)
Basophils Relative: 0 %
Eosinophils Absolute: 0.4 10*3/uL (ref 0.0–0.5)
Eosinophils Relative: 5 %
HCT: 23.3 % — ABNORMAL LOW (ref 36.0–46.0)
Hemoglobin: 7.9 g/dL — ABNORMAL LOW (ref 12.0–15.0)
Immature Granulocytes: 1 %
Lymphocytes Relative: 17 %
Lymphs Abs: 1.2 10*3/uL (ref 0.7–4.0)
MCH: 30.6 pg (ref 26.0–34.0)
MCHC: 33.9 g/dL (ref 30.0–36.0)
MCV: 90.3 fL (ref 80.0–100.0)
Monocytes Absolute: 0.6 10*3/uL (ref 0.1–1.0)
Monocytes Relative: 9 %
Neutro Abs: 4.9 10*3/uL (ref 1.7–7.7)
Neutrophils Relative %: 68 %
Platelets: 136 10*3/uL — ABNORMAL LOW (ref 150–400)
RBC: 2.58 MIL/uL — ABNORMAL LOW (ref 3.87–5.11)
RDW: 17.1 % — ABNORMAL HIGH (ref 11.5–15.5)
WBC: 7.2 10*3/uL (ref 4.0–10.5)
nRBC: 0.8 % — ABNORMAL HIGH (ref 0.0–0.2)

## 2020-04-12 LAB — PROTIME-INR
INR: 1.2 (ref 0.8–1.2)
Prothrombin Time: 14.3 seconds (ref 11.4–15.2)

## 2020-04-12 LAB — GLUCOSE, CAPILLARY
Glucose-Capillary: 103 mg/dL — ABNORMAL HIGH (ref 70–99)
Glucose-Capillary: 95 mg/dL (ref 70–99)
Glucose-Capillary: 213 mg/dL — ABNORMAL HIGH (ref 70–99)
Glucose-Capillary: 191 mg/dL — ABNORMAL HIGH (ref 70–99)

## 2020-04-12 MED ORDER — CEFAZOLIN SODIUM-DEXTROSE 2-4 GM/100ML-% IV SOLN
INTRAVENOUS | Status: AC
  Filled 2020-04-12: qty 100

## 2020-04-12 MED ORDER — MIDAZOLAM HCL 2 MG/2ML IJ SOLN
INTRAMUSCULAR | Status: AC
  Filled 2020-04-12: qty 2

## 2020-04-12 MED ORDER — FENTANYL CITRATE (PF) 100 MCG/2ML IJ SOLN
INTRAMUSCULAR | Status: AC
  Filled 2020-04-12: qty 2

## 2020-04-12 MED ORDER — CEFAZOLIN SODIUM-DEXTROSE 1-4 GM/50ML-% IV SOLN
INTRAVENOUS | Status: AC | PRN
  Administered 2020-04-12: 2 g via INTRAVENOUS

## 2020-04-12 MED ORDER — FENTANYL CITRATE (PF) 100 MCG/2ML IJ SOLN
INTRAMUSCULAR | Status: AC | PRN
  Administered 2020-04-12: 50 ug via INTRAVENOUS
  Administered 2020-04-12: 25 ug via INTRAVENOUS

## 2020-04-12 MED ORDER — LIDOCAINE HCL (PF) 1 % IJ SOLN
INTRAMUSCULAR | Status: AC | PRN
  Administered 2020-04-12: 10 mL via INTRADERMAL

## 2020-04-12 MED ORDER — MIDAZOLAM HCL 2 MG/2ML IJ SOLN
INTRAMUSCULAR | Status: AC | PRN
  Administered 2020-04-12: 1 mg via INTRAVENOUS
  Administered 2020-04-12: 0.5 mg via INTRAVENOUS

## 2020-04-12 MED ORDER — MORPHINE SULFATE (PF) 2 MG/ML IV SOLN
2.0000 mg | Freq: Once | INTRAVENOUS | Status: AC
  Administered 2020-04-12: 2 mg via INTRAVENOUS
  Filled 2020-04-12: qty 1

## 2020-04-12 NOTE — Care Management Important Message (Signed)
Important Message  Patient Details IM Letter given to Dessa Phi RN Case Manager to present to the Patient Name: Wendy [name] MRN: [id number] Date of Birth: 12-12-1940   Medicare Important Message Given:  Yes     Kerin Salen 04/12/2020, 12:10 PM

## 2020-04-12 NOTE — Procedures (Signed)
Interventional Radiology Procedure Note  Procedure: RIGHT tunneled pleural drain placement  Complications: None  Estimated Blood Loss: None  Recommendations: - Return to room - Begin use tomorrow   Signed,  Criselda Peaches, MD

## 2020-04-12 NOTE — Progress Notes (Signed)
Inpatient Diabetes Program Recommendations  AACE/ADA: New Consensus Statement on Inpatient Glycemic Control (2015)  Target Ranges:  Prepandial:   less than 140 mg/dL      Peak postprandial:   less than 180 mg/dL (1-2 hours)      Critically ill patients:  140 - 180 mg/dL   Lab Results  Component Value Date   GLUCAP 103 (H) 04/12/2020   HGBA1C 7.2 (H) 03/02/2020    Review of Glycemic Control Results for Wendy [name], Wendy [name] (MRN [id number]) as of 04/12/2020 09:56  Ref. Range 04/11/2020 07:34 04/11/2020 12:02 04/11/2020 16:35 04/11/2020 22:18 04/12/2020 08:02  Glucose-Capillary Latest Ref Range: 70 - 99 mg/dL 304 (H) 364 (H) 284 (H) 235 (H) 103 (H)   Diabetes history: DM 2 Outpatient Diabetes medications: Lantus 28 units if glucose <240 mg/dl, 35 units if glucose > 240 mg/dl, Novolin Regular insulin 10 units tid meal coverage Current orders for Inpatient glycemic control:  Lantus 32 units qhs Novolog 0-20 units tid + hs scale  Inpatient Diabetes Program Recommendations:    Pt not ordered Solumedrol, no longer on steroids. Fasting glucose 103 this am.  -  Consider decreasing Lantus to 28 units -  Consider decreasing Novolog Correction to "Moderate" 0-15 units tid  Thanks,  Tama Headings RN, MSN, BC-ADM Inpatient Diabetes Coordinator Team Pager [phone] (8a-5p)

## 2020-04-12 NOTE — Progress Notes (Signed)
Wendy [name]   DOB:[birth year]   WU#:[id number]   GNF#:[id number]  Oncology follow up   Subjective: Patient underwent right tunneled pleural drain placement by IR today.  She has some soreness at the tube site, no other new complaints.  Objective:  Vitals:   04/12/20 0926 04/12/20 1304  BP: 134/68 (!) 103/56  Pulse:  61  Resp:  16  Temp:  97.7 F (36.5 C)  SpO2:  100%    Body mass index is 36.05 kg/m.  Intake/Output Summary (Last 24 hours) at 04/12/2020 2026 Last data filed at 04/12/2020 1500 Gross per 24 hour  Intake 360 ml  Output 1300 ml  Net -940 ml     Sclerae unicteric  Oropharynx clear  No peripheral adenopathy  Lungs clear, decreased breath sounds on bases, R>L  Heart regular rate and rhythm  Abdomen benign    CBG (last 3)  Recent Labs    04/12/20 0802 04/12/20 1125 04/12/20 1635  GLUCAP 103* 95 213*     Labs:  Urine Studies No results for input(s): UHGB, CRYS in the last 72 hours.  Invalid input(s): UACOL, UAPR, USPG, UPH, UTP, UGL, UKET, UBIL, UNIT, UROB, ULEU, UEPI, UWBC, URBC, UBAC, CAST, [place], [place]  Basic Metabolic Panel: Recent Labs  Lab 04/07/20 1103 04/07/20 1103 04/08/20 1532 04/08/20 1532 04/09/20 0445 04/09/20 1811 04/10/20 0536 04/11/20 0553 04/12/20 0549  NA 142  --  140  --   --   --  136  --  137  K 4.2   < > 4.4   < >  --   --  4.0  --  3.7  CL 99  --  96*  --   --   --  94*  --  96*  CO2 36*  --  32  --   --   --  30  --  32  GLUCOSE 141*  --  278*  --   --  458* 441*  --  133*  BUN 11  --  18  --   --   --  38*  --  31*  CREATININE 1.56*   < > 1.91*  --  2.03*  --  1.76* 1.48* 1.33*  CALCIUM 9.1  --  8.7*  --   --   --  7.8*  --  7.9*  MG  --   --   --   --   --   --  2.0  --   --   PHOS  --   --   --   --   --   --  3.3  --   --    < > = values in this interval not displayed.   GFR Estimated Creatinine Clearance: 36.2 mL/min (A) (by C-G formula based on SCr of 1.33 mg/dL (H)). Liver Function Tests: Recent Labs  Lab  04/07/20 1103 04/12/20 0549  AST 11* 10*  ALT 6 9  ALKPHOS 121 96  BILITOT 0.5 0.6  PROT 6.4* 5.7*  ALBUMIN 3.2* 3.1*   No results for input(s): LIPASE, AMYLASE in the last 168 hours. No results for input(s): AMMONIA in the last 168 hours. Coagulation profile Recent Labs  Lab 04/12/20 0549  INR 1.2    CBC: Recent Labs  Lab 04/07/20 1103 04/08/20 1532 04/09/20 0445 04/10/20 0536 04/12/20 0549  WBC 7.8 8.4 8.3 7.3 7.2  NEUTROABS 6.0 7.5  --   --  4.9  HGB 8.6* 9.1* 8.0* 7.6* 7.9*  HCT  25.8* 27.3* 23.5* 22.2* 23.3*  MCV 90.8 89.5 89.4 88.1 90.3  PLT 149* 174 160 150 136*   Cardiac Enzymes: No results for input(s): CKTOTAL, CKMB, CKMBINDEX, TROPONINI in the last 168 hours. BNP: Invalid input(s): POCBNP CBG: Recent Labs  Lab 04/11/20 1635 04/11/20 2218 04/12/20 0802 04/12/20 1125 04/12/20 1635  GLUCAP 284* 235* 103* 95 213*   D-Dimer No results for input(s): DDIMER in the last 72 hours. Hgb A1c No results for input(s): HGBA1C in the last 72 hours. Lipid Profile No results for input(s): CHOL, HDL, LDLCALC, TRIG, CHOLHDL, LDLDIRECT in the last 72 hours. Thyroid function studies No results for input(s): TSH, T4TOTAL, T3FREE, THYROIDAB in the last 72 hours.  Invalid input(s): FREET3 Anemia work up No results for input(s): VITAMINB12, FOLATE, FERRITIN, TIBC, IRON, RETICCTPCT in the last 72 hours. Microbiology Recent Results (from the past 240 hour(s))  Blood culture (routine x 2)     Status: None (Preliminary result)   Collection Time: 04/08/20 12:58 PM   Specimen: BLOOD  Result Value Ref Range Status   Specimen Description   Final    BLOOD RIGHT ARM Performed at [facility] [address], [place], [place] [address]    Special Requests   Final    BOTTLES DRAWN AEROBIC AND ANAEROBIC Blood Culture adequate volume Performed at [facility] [address] [place], [place] [address]    Culture   Final    NO GROWTH  4 DAYS Performed at [facility], [place] [address], [place], [place] [address]    Report Status PENDING  Incomplete  Urine culture     Status: None   Collection Time: 04/08/20  1:53 PM   Specimen: Urine, Catheterized  Result Value Ref Range Status   Specimen Description   Final    URINE, CATHETERIZED Performed at [facility] [address] [place], [place] [address]    Special Requests   Final    NONE Performed at [facility], [place] [address], [place], [place] [address]    Culture   Final    NO GROWTH Performed at [facility], [place] [address], [place],  [address]    Report Status 04/09/2020 FINAL  Final  SARS Coronavirus 2 by RT PCR (hospital order, performed in [facility] hospital lab) Nasopharyngeal Nasopharyngeal Swab     Status: None   Collection Time: 04/08/20  6:27 PM   Specimen: Nasopharyngeal Swab  Result Value Ref Range Status   SARS Coronavirus 2 NEGATIVE NEGATIVE Final    Comment: (NOTE) SARS-CoV-2 target nucleic acids are NOT DETECTED.  The SARS-CoV-2 RNA is generally detectable in upper and lower respiratory specimens during the acute phase of infection. The lowest concentration of SARS-CoV-2 viral copies this assay can detect is 250 copies / mL. A negative result does not preclude SARS-CoV-2 infection and should not be used as the sole basis for treatment or other patient management decisions.  A negative result may occur with improper specimen collection / handling, submission of specimen other than nasopharyngeal swab, presence of viral mutation(s) within the areas targeted by this assay, and inadequate number of viral copies (<250 copies / mL). A negative result must be combined with clinical observations, patient history, and epidemiological information.  Fact Sheet for Patients:   StrictlyIdeas.no  Fact Sheet for Healthcare  Providers: BankingDealers.co.za  This test is not yet approved or  cleared by the [place] FDA and has been authorized for  detection and/or diagnosis of SARS-CoV-2 by FDA under an Emergency Use Authorization (EUA).  This EUA will remain in effect (meaning this test can be used) for the duration of the COVID-19 declaration under Section 564(b)(1) of the Act, 21 U.S.C. section 360bbb-3(b)(1), unless the authorization is terminated or revoked sooner.  Performed at [facility], [place] [address], [place], [place] [address]   Blood culture (routine x 2)     Status: None (Preliminary result)   Collection Time: 04/08/20  9:04 PM   Specimen: BLOOD RIGHT HAND  Result Value Ref Range Status   Specimen Description   Final    BLOOD RIGHT HAND Performed at [facility] [address] [place], [place] [address]    Special Requests   Final    BOTTLES DRAWN AEROBIC ONLY Blood Culture adequate volume Performed at [facility] [address], [place], [place] [address]    Culture   Final    NO GROWTH 4 DAYS Performed at [facility], [place] [address], [place], [place] [address]    Report Status PENDING  Incomplete  MRSA PCR Screening     Status: None   Collection Time: 04/09/20  9:29 AM   Specimen: Nasal Mucosa; Nasopharyngeal  Result Value Ref Range Status   MRSA by PCR NEGATIVE NEGATIVE Final    Comment:        The GeneXpert MRSA Assay (FDA approved for NASAL specimens only), is one component of a comprehensive MRSA colonization surveillance program. It is not intended to diagnose MRSA infection nor to guide or monitor treatment for MRSA infections. Performed at [facility], [place] [address] [place], [place] [address]       Studies:  DG CHEST PORT 1 VIEW  Result Date: 04/11/2020 CLINICAL DATA:  Pleural effusion EXAM: PORTABLE CHEST 1 VIEW COMPARISON:  April 08, 2020 chest  radiograph; chest CT March 22, 2020 FINDINGS: Pleural effusion again noted on the right. There are areas of atelectatic change in the lung bases. Nodular lesions are seen in the right upper lobe and to a lesser degree in the left upper lobe, better seen on recent CT. There is patchy airspace opacity in the right perihilar region as well, similar to recent CT. Heart is borderline enlarged with pulmonary vascularity normal. No convincing adenopathy by radiography. No evident bone lesions. IMPRESSION: Persistent right pleural effusion. Bibasilar atelectasis. Ill-defined airspace opacity in the right perihilar region. There are pulmonary nodular lesions in the upper lobes, larger on the right than on the left, much better seen on recent CT, consistent with underlying metastatic disease. Stable cardiac silhouette. Electronically Signed   By: Lowella Grip III M.D.   On: 04/11/2020 07:57   IR PERC PLEURAL DRAIN W/INDWELL CATH W/IMG GUIDE  Result Date: 04/12/2020 INDICATION: 79 year old female with a history of metastatic breast cancer and malignant bilateral right larger than left pleural effusions. She presents for placement of a tunneled pleural drainage catheter on the right for palliative management of her recurrent pleural effusion. EXAM: Tunneled pleural drainage catheter placement MEDICATIONS: 2 g Ancef intravenous ANESTHESIA/SEDATION: Fentanyl 75 mcg IV; Versed 1.5 mg IV Moderate Sedation Time:  21 minutes The patient was continuously monitored during the procedure by the interventional radiology nurse under my direct supervision. COMPLICATIONS: None immediate. PROCEDURE: Informed written consent was obtained from the patient after a thorough discussion of the procedural risks, benefits and alternatives. All questions were addressed. Maximal Sterile Barrier Technique was utilized including caps,  mask, sterile gowns, sterile gloves, sterile drape, hand hygiene and skin antiseptic. A timeout was performed  prior to the initiation of the procedure. The right lateral chest was interrogated with ultrasound. A small to moderate residual pleural effusion was identified. A suitable skin entry site was selected and marked. Local anesthesia was attained by infiltration with 1% lidocaine. A small dermatotomy was made. Under real-time ultrasound guidance, an 18 gauge sheath needle was carefully advanced over a rib and into the pleural space. The needle portion was removed. A 0 point 3 5 Amplatz wire was advanced through the sheath and into the pleural space. The wire was secured. A suitable skin exit site approximately 5 cm medial and inferior to the pleural entry site was identified. Local anesthesia was again attained by infiltration with 1% lidocaine. A second dermatotomy was made. The 60 French PleurX drainage catheter was then tunneled from the skin exit site to the dermatotomy overlying the pleural entry site. Attention was turned back to the wire. The 18 French sheath was removed. A 15.5 French peel-away sheath was carefully advanced over the wire and into the pleural space. The wire was removed. The PleurX catheter was advanced through the peel-away sheath and the peel-away sheath was discarded. The catheter was then connected to a vacuum tender and approximately 800 mL of golden pleural fluid was evacuated. Follow-up chest x-ray demonstrates a well-positioned tunneled pleural drainage catheter with resolution of pleural effusion and no evidence of pneumothorax. There is some persistent atelectasis in the region of the right middle lobe. The dermatotomy overlying the pleural access site was then closed with a 3-0 inverted interrupted Vicryl suture. The epidermis was sealed with Dermabond. The catheter was secured to the skin exit site using 0 Prolene suture. Sterile bandages were applied. The patient tolerated the procedure well. IMPRESSION: Successful placement of a right-sided tunneled pleural drainage catheter.  Electronically Signed   By: Jacqulynn Cadet M.D.   On: 04/12/2020 11:11    Assessment: 79 y.o. AAF  1.  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  Bilateral malignant pleural effusion, R>L, s/p pleurx placement today  3.  Metastatic breast cancer, on oral chemo Xeloda  4.  CKD 5. HTN,DM 6. Anemia secondary to cancer and chemo   Plan:  -I appreciate IR assistance for pleurx placement, I assume she will be discharged with home care for drain management  -if things go well this well, I told pt to restart Xeloda next Monday 7/19 (it has been held since hospital admission). She will take for 7 days then off 7 days  -today's lab reviewed, OK to hold blood transfusion for now  -she is scheduled for f/u with me on 7/29 with lab   [name]  04/12/2020

## 2020-04-12 NOTE — TOC Progression Note (Signed)
Transition of Care [facility]) - Progression Note    Patient Details  Name: Wendy [name] MRN: [id number] Date of Birth: 10-30-1940  [facility] CM/SW Contact  Caasi Giglia, Juliann Pulse, RN Phone Number: 04/12/2020, 12:08 PM  Clinical Narrative: Damaris Schooner to patient/family in rm about d/c plans-home w/HHC-[facility] alrqaeady set up rep Malachy Mood following-await HHRN/PT/OT/aide orders;pleurx drain need specific orders added to [facility] comments.Pleurx drain form in shadow chart for MD to complete.[facility] rep Thedore Mins following for hospital bed delivery to home 2 d/c-contact person [name] for delivery arrangements. Family wil transport home on own.      Expected Discharge Plan: [place] Barriers to Discharge: Continued Medical Work up  Expected Discharge Plan and Services Expected Discharge Plan: [place] In-house Referral: Clinical Social Work   Post Acute Care Choice: Durable Medical Equipment, Home Health Living arrangements for the past 2 months: Single Family Home                                       Social Determinants of Health (SDOH) Interventions    Readmission Risk Interventions Readmission Risk Prevention Plan 04/09/2020  Transportation Screening Complete  Medication Review [employer]) Complete  HRI or Home Care Consult Complete  SW Recovery Care/Counseling Consult Complete  Palliative Care Screening Complete  Skilled Nursing Facility Complete  Some recent data might be hidden

## 2020-04-12 NOTE — Progress Notes (Signed)
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 Abnormal breast finding 2018   per pt/ having  a lot drainage from left breast nipple  . Anemia   . Anxiety   . Breast cancer ([place]) 11/2016   left/  . CKD (chronic kidney disease)   . Depression   . GERD (gastroesophageal reflux disease)    TUMS as needed  . HTN (hypertension)    states BP has been high recently; has been on med. x 20 yr.  . Hyperlipidemia   . Hyperplastic colon polyp   . HYPERTENSION, BENIGN SYSTEMIC 11/29/2006   Lisinopril hctz 10-12.5mg , amlodipine 10mg , coreg 25mg  BID, clonidine 0.3 mg BID.   Marland Kitchen Insulin dependent diabetes mellitus   . Personal history of chemotherapy    2018/finished 6 weeks of chemo in Sep 2018  . Personal history of radiation therapy    2018 left breast/finished radiation in Sept 2018 per pt.     Significant Hospital Events   Feeling better  Consults:  PCCM   Procedures:  none  Significant Diagnostic Tests:  Chest x-ray 04/08/2020 -Bilateral pleural effusion worse on the right Chest x-ray 04/12/19/2021 -Reviewed by myself, bilateral pleural effusion  worse on the right, nodular changes in the lungs 7/12 Pleurx catheter placed Micro Data:  Blood culture 7/8 Urine culture 7/8 MRSA PCR negative  Antimicrobials:  Cefepime 7/8 Vancomycin 7/8  Interim history/subjective:  No overnight events, Complaining of pain at the site of Pleurx insertion  Objective   Blood pressure 134/68, pulse 69, temperature 98.7 F (37.1 C), temperature source Oral, resp. rate 15, height 5\' 2"  (1.575 m), weight 89.4 kg, SpO2 99 %.        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 -Continues on chemotherapy  Malignant pleural effusion  s/p Pleurx catheter placement in the right pleural space  Hypoxemic respiratory failure secondary to atelectasis and pleural effusion -Stable status at present -Continue oxygen supplementation  Underlying chronic obstructive pulmonary disease -Continue bronchodilators -Discontinue steroids  Congestive heart failure   Acute kidney injury on chronic kidney disease  stage III -Avoid nephrotoxic's -Trend electrolytes, creatinine improving  Hypertension Type 2 diabetes Chronic anemia  Patient may be discharged from a pulmonary perspective Ensure post Pleurx catheter orders are in place  PCCM will sign off, contact as needed  Sherrilyn Rist, MD [facility] Pager: [phone]

## 2020-04-13 LAB — CULTURE, BLOOD (ROUTINE X 2)
Culture: NO GROWTH
Culture: NO GROWTH
Special Requests: ADEQUATE
Special Requests: ADEQUATE

## 2020-04-13 LAB — GLUCOSE, CAPILLARY
Glucose-Capillary: 79 mg/dL (ref 70–99)
Glucose-Capillary: 160 mg/dL — ABNORMAL HIGH (ref 70–99)

## 2020-04-13 MED ORDER — DOCUSATE SODIUM 100 MG PO CAPS: 100.0000 mg | ORAL_CAPSULE | Freq: Two times a day (BID) | ORAL | 0 refills | Status: AC

## 2020-04-13 MED ORDER — OXYCODONE HCL 5 MG PO TABS
5.0000 mg | ORAL_TABLET | Freq: Four times a day (QID) | ORAL | Status: DC | PRN
  Administered 2020-04-13: 5 mg via ORAL
  Filled 2020-04-13: qty 1

## 2020-04-13 MED ORDER — INSULIN REGULAR HUMAN 100 UNIT/ML IJ SOLN: 2.0000 [IU] | Freq: Three times a day (TID) | INTRAMUSCULAR | 11 refills | Status: AC

## 2020-04-13 MED ORDER — BENZONATATE 100 MG PO CAPS
100.0000 mg | ORAL_CAPSULE | Freq: Four times a day (QID) | ORAL | 0 refills | Status: DC | PRN
Start: 2020-04-13 — End: 2020-04-16

## 2020-04-13 MED ORDER — PRAVASTATIN SODIUM 20 MG PO TABS: 20.0000 mg | ORAL_TABLET | Freq: Every day | ORAL | 0 refills | Status: DC

## 2020-04-13 MED ORDER — LISINOPRIL 5 MG PO TABS: 5.0000 mg | ORAL_TABLET | Freq: Every day | ORAL | 11 refills | Status: AC

## 2020-04-13 MED ORDER — OXYCODONE HCL 5 MG PO TABS: 5.0000 mg | ORAL_TABLET | Freq: Four times a day (QID) | ORAL | 0 refills | Status: DC | PRN

## 2020-04-13 MED ORDER — LANTUS SOLOSTAR 100 UNIT/ML ~~LOC~~ SOPN: 30.0000 [IU] | PEN_INJECTOR | Freq: Every day | SUBCUTANEOUS | 3 refills | Status: AC

## 2020-04-13 MED FILL — BENZONATATE 100 MG CAPS: 100 | 8 days supply | Qty: 30 | Fill #0

## 2020-04-13 MED FILL — LISINOPRIL 5 MG TABLET: 5 | 30 days supply | Qty: 30 | Fill #0

## 2020-04-13 MED FILL — oxyCODONE HCL 5 MG TABS: 5 | 5 days supply | Qty: 20 | Fill #0

## 2020-04-13 MED FILL — BASAGLAR 100 UNIT/ML KWIKPE: 100 | 90 days supply | Qty: 27 | Fill #0

## 2020-04-13 NOTE — Progress Notes (Signed)
Pleurx draining teaching done with family/caregiver. Supplies sent with pt. Very little return when drained. Approximately 15 cc. Instructions given on how to moniter output and when to notify IR. Family verbalized understanding. Eulas Post, RN

## 2020-04-13 NOTE — Discharge Summary (Addendum)
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, with long-term current use of insulin (HCC)   CKD (chronic kidney disease) stage 3, GFR 30-59 ml/min (HCC)   Malignant neoplasm of upper-outer quadrant of left breast in female, estrogen receptor positive (HCC)   GERD (gastroesophageal reflux disease)   Malignant pleural effusion   Metastatic breast cancer [facility])   Hospital Course Summary: 79 y.o.femalewith medical history significant ofmetastatic breast cancer on chemotherapy, hypertension, stage III CKD, type 2 diabetes, chronic anemia, major depression, anxiety, tobacco use,presents in the emergency department via EMS for shortness of breath. Patient was recently hospitalized on March 22, 2020 for community-acquired pneumonia. Patient uses oxygen at baseline at 4 L via nasal cannula saturating 92%.She was found confused and had significant difficulty breathing at the facility.Her O2 saturation was 50% on4Lwhich improved to 70% when her oxygen was increased to 6 L. After EMS arrived,patient was found hypothermic,she was placed on 10 L oxygen via nonrebreather. Daughter reports that she appeared to be confused and suspect this could be because of her hypoxia. ED Course: AfebrileCXR:showedModerate right pleural effusion and bibasilar atelectais.There are pulmonary nodular lesions in the upper lobes, larger on the right than on the left, much better seen on recent CT (6/30), consistent with underlying metastatic disease. During previous hospitalization she  wasfound to have the pleural effusion but did not have significant effusion at the timeof dischargeto have a Pleurx catheter placed. Hospital course: Patient admitted to [facility] for further management.Plan: Pleurx catheter by IR7/12.  Acute on chronichypoxicrespiratory failure: Multifactorial. POA and now improving. Likely secondary to pleural effusion and atelectasis. Right perihilar infiltrate reported along with metastatic disease on CXR. Symptoms likely secondary to malignantpleural effusion, other differentials considered are CHF, pneumonia, may also have COPD component:O2 saturationon arrival 70%on 6 L,placed on non rebreatherbriefly.Continue supplemental oxygen to maintain saturation above 94%.Her breathing has significantly improved after she received Solu-Medrol 125 mgonce.She is weaned down to 4 L supplemental oxygen saturating>90%. Repeat CXR on 7/11 with similar findings. Continue DuoNeb,Solu-Medrol 40 every 12hr and Lasix 20 mg daily. Covid test, proBNP,procalcitonin levelnormal.Echo6/21:Left ventricular EF 55 to 60%. Pulmonology consulted,recommendedDC antibiotics, underwent Pluerix catheter placement on the right side on 7/12 through IR.  Tolerated well.  Discussed at length with family regarding Pleurx catheter management at home. Patient follows Dr. Vaughan Browner for pulmonary as outpatient and is followed by [facility] care as well for palliative management.  She complained of acute pain unrelieved by oral narcotics (tramadol, hydrocodone)  at the site of Pleurx catheter, recieved IV morphine x1 and feels oxycodone helping better. Pain regimen changed on discharge.  History of metastatic breast cancer: Now with unilateral pleural effusion Continue chemotherapy.Outpatientoncologyfollow up scheduled for 7/29. Per Dr [employer] Xeloda next Monday 7/19 (it has been held since hospital admission). She will take for 7 days then off 7 days   . Acute kidney injury on CKD stage  GEX:[id number] 1.9 on presentation, peaked to 2.0 and now improved to 1.3 (baseline creatinine1.4) after holding HCTZ/lisinopril.remains on Lasix 20 mg daily and tolerating well.  Avoid nephrotoxic medications.  Essential hypertension:Continue coreg,clonidine, amlodipine, hold lisinopril HCTZ. Resume lisinopril at lower dose as SBP  in 150-160s and renal function improved.  Diabetes mellitus type 2:Persistent hyperglycemia last week with BG in 300-400s promptings increase in Lantus dose to 32 units (was taking 28 units if BG <240 and 35 units if BG >240 at home along with regular insulin 10 units before meals). Now BG ranging 95-low 200s with just Lantus and without premeal insulin. Patient advised to take 30 units at home and take premeal insulin (2-3 units) only if BG persistently greater 250. Advised to utilize sliding scale and monitor BG levels in diary for PCP review.   Acute on chronic anemia: Likely due to chronic blood loss in the setting of malignant effusion as well as chronic anemia secondary tobreast ca and chemotherapy.  Threshold to transfusion would be less than 7.  Hemoglobin currently stable at 7.9 (compared to 7/10). No need for blood transfusion per hematology, they will f/u on 7/29 with repeat labs  Hyperlipidemia/triglyceredemia: On statins but can possibly come off this medicine given advanced malignancy , care goals -defer to palliative care team. Also suggest repeat fasting lipid profile (6/30) as last lab could have been non fasting. Last HDL-55, LDL 93  Discharge Exam:   Vitals:   04/13/20 0500 04/13/20 0551 04/13/20 0552 04/13/20 0819  BP:  (!) 152/76 (!) 161/70   Pulse:  77 74 75  Resp:  19 19 18   Temp:  98.3 F (36.8 C) 98.3 F (36.8 C)   TempSrc:  Oral Oral   SpO2:  100% 100% 98%  Weight: 88.9 kg     Height:        General: Pt is alert, awake, not in acute distress Cardiovascular: RRR, S1/S2 +, no rubs, no gallops Respiratory: CTA bilaterally, no  wheezing, no rhonchi. Pleurx in place to rt chest wall Abdominal: Soft, NT, ND, bowel sounds + Extremities: no edema, no cyanosis  Discharge Condition:Stable CODE STATUS: DNR Diet recommendation: heart healthy, carb modified Recommendations for Outpatient Follow-up:  1. Follow up with PCP: 5 days 2. Follow up with consultants: she is scheduled for f/u with Dr Burr Medico on 7/29 with lab , f/u primary pulmonologist Dr Vaughan Browner in 10 days 3. Please obtain follow up labs including: CBC/BMP in 5 days   Home Health services upon discharge: yes-Home PT/RN. Already follows palliative care through [facility] upon discharge: resume Home 02   Discharge Instructions:  Discharge Instructions    Ambulatory Pleural Drainage Schedule   Complete by: As directed    Drain daily, up to max of 1L until patient is only able to drain out 133ml. If <12ml for 3 consecutive drains every other day, then call Interventional Radiology [phone]) for evaluation and possible removal.   Call MD for:  difficulty breathing, headache or visual disturbances   Complete by: As directed    Call MD for:  persistant dizziness or light-headedness   Complete by: As directed    Call MD for:  persistant nausea and vomiting   Complete by: As directed    Call MD for:  redness, tenderness, or signs of infection (pain, swelling, redness, odor or green/yellow discharge around incision site)   Complete by: As directed    Call MD for:  severe uncontrolled pain   Complete by: As directed    Call MD for:  temperature >100.4   Complete by: As directed    Change dressing (specify)   Complete by: As directed    Dressing change as needed   Diet - low sodium heart healthy   Complete by:  As directed    Increase activity slowly   Complete by: As directed      Allergies as of 04/13/2020   No Known Allergies     Medication List    STOP taking these medications   amoxicillin-clavulanate 875-125 MG tablet Commonly  known as: AUGMENTIN   HYDROcodone-acetaminophen 5-325 MG tablet Commonly known as: NORCO/VICODIN   Imodium A-D 2 MG capsule Generic drug: loperamide   lisinopril-hydrochlorothiazide 20-12.5 MG tablet Commonly known as: ZESTORETIC   lovastatin 40 MG tablet Commonly known as: MEVACOR Replaced by: pravastatin 20 MG tablet   traMADol 50 MG tablet Commonly known as: ULTRAM     TAKE these medications   albuterol 108 (90 Base) MCG/ACT inhaler Commonly known as: VENTOLIN HFA Inhale 2 puffs into the lungs every 6 (six) hours as needed for wheezing or shortness of breath.   amLODipine 10 MG tablet Commonly known as: NORVASC TAKE 1 TABLET BY MOUTH EVERY DAY   Insulin Syringe-Needle U-100 30G X 1/2" 0.5 ML Misc Commonly known as: B-D INS SYRINGE 0.5CC/30GX1/2" USE AS DIRECTED 4 TIMES A DAY   B-D INS SYRINGE 0.5CC/31GX5/16 31G X 5/16" 0.5 ML Misc Generic drug: Insulin Syringe-Needle U-100 USE AS DIRECTED 4 TIMES A DAY   benzonatate 100 MG capsule Commonly known as: Tessalon Perles Take 1 capsule (100 mg total) by mouth every 6 (six) hours as needed for cough.   Breo Ellipta 100-25 MCG/INH Aepb Generic drug: fluticasone furoate-vilanterol Inhale 1 puff into the lungs daily.   busPIRone 10 MG tablet Commonly known as: BUSPAR Take 1 tablet (10 mg total) by mouth 3 (three) times daily.   carvedilol 25 MG tablet Commonly known as: COREG Take 1 tablet (25 mg total) by mouth 2 (two) times daily with a meal.   cloNIDine 0.3 MG tablet Commonly known as: CATAPRES TAKE 1 TABLET (0.3 MG TOTAL) BY MOUTH 2 (TWO) TIMES DAILY.   docusate sodium 100 MG capsule Commonly known as: COLACE Take 1 capsule (100 mg total) by mouth 2 (two) times daily.   famotidine 20 MG tablet Commonly known as: PEPCID TAKE 1 TABLET BY MOUTH EVERY DAY   fluticasone 50 MCG/ACT nasal spray Commonly known as: FLONASE Place 1 spray into both nostrils daily. What changed:   when to take this  reasons to  take this   furosemide 20 MG tablet Commonly known as: LASIX Take 20 mg by mouth daily.   Insulin Pen Needle 31G X 5 MM Misc Use pen needles for insulin injection daily   insulin regular 100 units/mL injection Commonly known as: NOVOLIN R Inject 0.02-0.03 mLs (2-3 Units total) into the skin 3 (three) times daily before meals. If BG persistently greater than 250 on QID accuchecks at home. Can use as Sliding scale insulin as well What changed:   how much to take  additional instructions   Lantus SoloStar 100 UNIT/ML Solostar Pen Generic drug: insulin glargine Inject 30 Units into the skin daily. Inject 32 units into the skin in am What changed:   how much to take  how to take this  when to take this  additional instructions   lisinopril 5 MG tablet Commonly known as: ZESTRIL Take 1 tablet (5 mg total) by mouth daily.   montelukast 10 MG tablet Commonly known as: Singulair Take 1 tablet (10 mg total) by mouth at bedtime.   ondansetron 8 MG tablet Commonly known as: Zofran Take 1 tablet (8 mg total) by mouth 2 (two) times daily as needed (Nausea  or vomiting).   oxyCODONE 5 MG immediate release tablet Commonly known as: Oxy IR/ROXICODONE Take 1 tablet (5 mg total) by mouth every 6 (six) hours as needed for moderate pain or severe pain.   polyethylene glycol powder 17 GM/SCOOP powder Commonly known as: GLYCOLAX/MIRALAX Take 17 g by mouth daily. What changed:   when to take this  reasons to take this   pravastatin 20 MG tablet Commonly known as: PRAVACHOL Take 1 tablet (20 mg total) by mouth daily. Replaces: lovastatin 40 MG tablet   prochlorperazine 10 MG tablet Commonly known as: COMPAZINE Take 1 tablet (10 mg total) by mouth every 6 (six) hours as needed for nausea or vomiting.   Xeloda 500 MG tablet Generic drug: capecitabine Take 3 tablets (1,500 mg total) by mouth 2 (two) times daily after a meal. Take for 14 days, then hold for 7 days. Repeat every  21 days.            Durable Medical Equipment  (From admission, onward)         Start     Ordered   04/12/20 1248  For home use only DME 3 n 1  Once        04/12/20 1247   04/09/20 1525  For home use only DME Hospital bed  Once       Question Answer Comment  Length of Need Lifetime   Patient has (list medical condition): Malignant pleueral effusion, Metastaic breast cancer.   The above medical condition requires: Patient requires the ability to reposition immediately   Bed type Semi-electric   Hoyer Lift Yes   Trapeze Bar Yes   Support Surface: Low Air loss Mattress      04/09/20 1525           Discharge Care Instructions  (From admission, onward)         Start     Ordered   04/13/20 0000  Change dressing (specify)       Comments: Dressing change as needed   04/13/20 [place] Follow up.   Why: Hospital bed, bedside commode Contact information: [place] [address] [phone]        Care, [place] Follow up.   Why: HH nursing/physical therapy/occupational therapy/aide Contact information: [place] [address] [phone]              No Known Allergies    The results of significant diagnostics from this hospitalization (including imaging, microbiology, ancillary and laboratory) are listed below for reference.    Labs: BNP (last 3 results) Recent Labs    03/02/20 0932 03/22/20 1200 04/08/20 1532  BNP 41.3 120.3* 194.1*   Basic Metabolic Panel: Recent Labs  Lab 04/07/20 1103 04/07/20 1103 04/08/20 1532 04/09/20 0445 04/09/20 1811 04/10/20 0536 04/11/20 0553 04/12/20 0549  NA 142  --  140  --   --  136  --  137  K 4.2  --  4.4  --   --  4.0  --  3.7  CL 99  --  96*  --   --  94*  --  96*  CO2 36*  --  32  --   --  30  --  32  GLUCOSE 141*  --  278*  --  458* 441*  --  133*  BUN 11  --  18  --   --  38*  --  31*   CREATININE 1.56*   < > 1.91* 2.03*  --  1.76* 1.48* 1.33*  CALCIUM 9.1  --  8.7*  --   --  7.8*  --  7.9*  MG  --   --   --   --   --  2.0  --   --   PHOS  --   --   --   --   --  3.3  --   --    < > = values in this interval not displayed.   Liver Function Tests: Recent Labs  Lab 04/07/20 1103 04/12/20 0549  AST 11* 10*  ALT 6 9  ALKPHOS 121 96  BILITOT 0.5 0.6  PROT 6.4* 5.7*  ALBUMIN 3.2* 3.1*   No results for input(s): LIPASE, AMYLASE in the last 168 hours. No results for input(s): AMMONIA in the last 168 hours. CBC: Recent Labs  Lab 04/07/20 1103 04/08/20 1532 04/09/20 0445 04/10/20 0536 04/12/20 0549  WBC 7.8 8.4 8.3 7.3 7.2  NEUTROABS 6.0 7.5  --   --  4.9  HGB 8.6* 9.1* 8.0* 7.6* 7.9*  HCT 25.8* 27.3* 23.5* 22.2* 23.3*  MCV 90.8 89.5 89.4 88.1 90.3  PLT 149* 174 160 150 136*   Cardiac Enzymes: No results for input(s): CKTOTAL, CKMB, CKMBINDEX, TROPONINI in the last 168 hours. BNP: Invalid input(s): POCBNP CBG: Recent Labs  Lab 04/12/20 0802 04/12/20 1125 04/12/20 1635 04/12/20 2107 04/13/20 0805  GLUCAP 103* 95 213* 191* 79   D-Dimer No results for input(s): DDIMER in the last 72 hours. Hgb A1c No results for input(s): HGBA1C in the last 72 hours. Lipid Profile No results for input(s): CHOL, HDL, LDLCALC, TRIG, CHOLHDL, LDLDIRECT in the last 72 hours. Thyroid function studies No results for input(s): TSH, T4TOTAL, T3FREE, THYROIDAB in the last 72 hours.  Invalid input(s): FREET3 Anemia work up No results for input(s): VITAMINB12, FOLATE, FERRITIN, TIBC, IRON, RETICCTPCT in the last 72 hours. Urinalysis    Component Value Date/Time   COLORURINE YELLOW 04/08/2020 1353   APPEARANCEUR CLEAR 04/08/2020 1353   LABSPEC 1.014 04/08/2020 1353   PHURINE 7.0 04/08/2020 1353   GLUCOSEU 50 (A) 04/08/2020 1353   HGBUR SMALL (A) 04/08/2020 1353   BILIRUBINUR NEGATIVE 04/08/2020 1353   BILIRUBINUR Negative 10/07/2019 1121   KETONESUR NEGATIVE  04/08/2020 1353   PROTEINUR >=300 (A) 04/08/2020 1353   UROBILINOGEN 0.2 10/07/2019 1121   NITRITE NEGATIVE 04/08/2020 1353   LEUKOCYTESUR NEGATIVE 04/08/2020 1353   Sepsis Labs Invalid input(s): PROCALCITONIN,  WBC,  LACTICIDVEN Microbiology Recent Results (from the past 240 hour(s))  Blood culture (routine x 2)     Status: None (Preliminary result)   Collection Time: 04/08/20 12:58 PM   Specimen: BLOOD  Result Value Ref Range Status   Specimen Description   Final    BLOOD RIGHT ARM Performed at [facility], [place] [address] [place], [place] [address]    Special Requests   Final    BOTTLES DRAWN AEROBIC AND ANAEROBIC Blood Culture adequate volume Performed at [facility] [address], [place], [place] [address]    Culture   Final    NO GROWTH 4 DAYS Performed at [facility], [place] [address], [place], [place] [address]    Report Status PENDING  Incomplete  Urine culture     Status: None   Collection Time: 04/08/20  1:53 PM   Specimen: Urine, Catheterized  Result Value  Ref Range Status   Specimen Description   Final    URINE, CATHETERIZED Performed at [facility] [address], [place], [place] [address]    Special Requests   Final    NONE Performed at [facility], [place] [address], [place], [place] [address]    Culture   Final    NO GROWTH Performed at [facility], [place] [address], [place], [place] [address]    Report Status 04/09/2020 FINAL  Final  SARS Coronavirus 2 by RT PCR (hospital order, performed in [facility] hospital lab) Nasopharyngeal Nasopharyngeal Swab     Status: None   Collection Time: 04/08/20  6:27 PM   Specimen: Nasopharyngeal Swab  Result Value Ref Range Status   SARS Coronavirus 2 NEGATIVE NEGATIVE Final    Comment: (NOTE) SARS-CoV-2 target nucleic acids are NOT DETECTED.  The SARS-CoV-2 RNA is generally detectable in upper and lower respiratory  specimens during the acute phase of infection. The lowest concentration of SARS-CoV-2 viral copies this assay can detect is 250 copies / mL. A negative result does not preclude SARS-CoV-2 infection and should not be used as the sole basis for treatment or other patient management decisions.  A negative result may occur with improper specimen collection / handling, submission of specimen other than nasopharyngeal swab, presence of viral mutation(s) within the areas targeted by this assay, and inadequate number of viral copies (<250 copies / mL). A negative result must be combined with clinical observations, patient history, and epidemiological information.  Fact Sheet for Patients:   StrictlyIdeas.no  Fact Sheet for Healthcare Providers: BankingDealers.co.za  This test is not yet approved or  cleared by the [place] FDA and has been authorized for detection and/or diagnosis of SARS-CoV-2 by FDA under an Emergency Use Authorization (EUA).  This EUA will remain in effect (meaning this test can be used) for the duration of the COVID-19 declaration under Section 564(b)(1) of the Act, 21 U.S.C. section 360bbb-3(b)(1), unless the authorization is terminated or revoked sooner.  Performed at [facility], [place] [address], [place], [place] [address]   Blood culture (routine x 2)     Status: None (Preliminary result)   Collection Time: 04/08/20  9:04 PM   Specimen: BLOOD RIGHT HAND  Result Value Ref Range Status   Specimen Description   Final    BLOOD RIGHT HAND Performed at [facility] [address] [place], [place] [address]    Special Requests   Final    BOTTLES DRAWN AEROBIC ONLY Blood Culture adequate volume Performed at [facility] [address], [place], [place] [address]    Culture   Final    NO GROWTH 4 DAYS Performed at [facility], [place] [address]  [place], [place] [address]    Report Status PENDING  Incomplete  MRSA PCR Screening     Status: None   Collection Time: 04/09/20  9:29 AM   Specimen: Nasal Mucosa; Nasopharyngeal  Result Value Ref Range Status   MRSA by PCR NEGATIVE NEGATIVE Final    Comment:        The GeneXpert MRSA Assay (FDA approved for NASAL specimens only), is one component of a comprehensive MRSA colonization surveillance program. It is not intended to diagnose MRSA infection nor to guide or monitor treatment for MRSA infections. Performed at [facility], [place] [address] [place], [place] [address]     Procedures/Studies: DG Chest 1  View  Result Date: 03/18/2020 CLINICAL DATA:  79 year old female status post right thoracentesis. EXAM: CHEST  1 VIEW COMPARISON:  Chest x-ray 03/02/2020. FINDINGS: No pneumothorax. Small left pleural effusion. No definite right pleural effusion. Patchy multifocal interstitial and airspace disease noted throughout the mid to lower lungs bilaterally. No evidence of pulmonary edema. Heart size is normal. Upper mediastinal contours are within normal limits. Surgical clips in the left axillary region, likely from prior lymph node dissection. IMPRESSION: 1. No pneumothorax in this patient status post thoracentesis. 2. Small left pleural effusion. 3. Multilobar bilateral pneumonia throughout the mid to lower lungs bilaterally. Electronically Signed   By: Vinnie Langton M.D.   On: 03/18/2020 11:21   DG Chest 2 View  Result Date: 04/08/2020 CLINICAL DATA:  Shortness of breath EXAM: CHEST - 2 VIEW COMPARISON:  03/22/2020 radiograph, CT 03/22/2020 FINDINGS: Moderate right greater than left bilateral pleural effusions, increased compared to prior. Worsening airspace disease at both lung bases. Enlarged cardiomediastinal silhouette with vascular congestion and increased pulmonary edema. Pulmonary metastatic nodules not well appreciated likely due to diffuse bilateral airspace  disease. No pneumothorax. IMPRESSION: 1. Moderate right greater than left bilateral pleural effusions, increased compared to prior with worsening airspace disease at both lung bases. 2. Cardiomegaly with vascular congestion and increased pulmonary edema. Electronically Signed   By: Donavan Foil M.D.   On: 04/08/2020 15:32   CT Angio Chest PE W and/or Wo Contrast  Result Date: 03/22/2020 CLINICAL DATA:  Shortness of breath, history of metastatic breast cancer and pleural effusions EXAM: CT ANGIOGRAPHY CHEST WITH CONTRAST TECHNIQUE: Multidetector CT imaging of the chest was performed using the standard protocol during bolus administration of intravenous contrast. Multiplanar CT image reconstructions and MIPs were obtained to evaluate the vascular anatomy. CONTRAST:  73mL OMNIPAQUE IOHEXOL 350 MG/ML SOLN COMPARISON:  CT chest angiogram, 03/02/2020 FINDINGS: Cardiovascular: Satisfactory opacification of the pulmonary arteries to the segmental level. No evidence of pulmonary embolism. Normal heart size. Trace pericardial effusion. Mediastinum/Nodes: No enlarged mediastinal, hilar, or axillary lymph nodes. Thyroid gland, trachea, and esophagus demonstrate no significant findings. Lungs/Pleura: Moderate right, small left pleural effusions increased compared to prior CT dated 03/02/2020. Numerous bilateral pulmonary nodules. Upper Abdomen: No acute abnormality. Musculoskeletal: Soft tissue mass in the lateral left breast and axilla. No acute or significant osseous findings. Review of the MIP images confirms the above findings. IMPRESSION: 1. Negative examination for pulmonary embolism. 2. Moderate right, small left pleural effusions, increased compared to prior CT dated 03/02/2020. 3. Numerous bilateral pulmonary nodules, in keeping with metastatic breast malignancy. 4. Trace pericardial effusion, unchanged compared to prior examination. 5. Soft tissue mass in the lateral left breast and axilla, in keeping with known  breast malignancy. Electronically Signed   By: Eddie Candle M.D.   On: 03/22/2020 15:09   DG CHEST PORT 1 VIEW  Result Date: 04/11/2020 CLINICAL DATA:  Pleural effusion EXAM: PORTABLE CHEST 1 VIEW COMPARISON:  April 08, 2020 chest radiograph; chest CT March 22, 2020 FINDINGS: Pleural effusion again noted on the right. There are areas of atelectatic change in the lung bases. Nodular lesions are seen in the right upper lobe and to a lesser degree in the left upper lobe, better seen on recent CT. There is patchy airspace opacity in the right perihilar region as well, similar to recent CT. Heart is borderline enlarged with pulmonary vascularity normal. No convincing adenopathy by radiography. No evident bone lesions. IMPRESSION: Persistent right pleural effusion. Bibasilar atelectasis. Ill-defined airspace opacity in the  right perihilar region. There are pulmonary nodular lesions in the upper lobes, larger on the right than on the left, much better seen on recent CT, consistent with underlying metastatic disease. Stable cardiac silhouette. Electronically Signed   By: Lowella Grip III M.D.   On: 04/11/2020 07:57   DG Chest Portable 1 View  Result Date: 03/22/2020 CLINICAL DATA:  Shortness of breath.  Metastatic breast carcinoma. EXAM: PORTABLE CHEST 1 VIEW COMPARISON:  03/18/2020 FINDINGS: Moderate cardiomegaly appears mildly increased since previous study. Increased diffuse interstitial infiltrates may be due to interstitial edema. New confluent opacity is seen in the right midlung, suspicious for pneumonia. Left lower lobe atelectasis or infiltrate shows no significant change. Probable small bilateral pleural effusions noted. Small pulmonary nodules are noted, but less well visualized than on prior exam. Multiple surgical clips again seen in the left axilla. IMPRESSION: 1. New confluent opacity in right midlung, suspicious for pneumonia. 2. Mild increase in cardiomegaly and diffuse interstitial infiltrates,  suspicious for congestive heart failure. 3. Stable left lower lobe atelectasis versus infiltrate. Probable small bilateral pleural effusions. 4. Pulmonary metastases are less well visualized than on previous study. Electronically Signed   By: Marlaine Hind M.D.   On: 03/22/2020 12:03   ECHOCARDIOGRAM LIMITED  Result Date: 03/23/2020    ECHOCARDIOGRAM REPORT   Patient Name:   EVANGELYN CROUSE Date of Exam: 03/23/2020 Medical Rec #:  [id number]    Height:       62.0 in Accession #:    [id number]   Weight:       200.4 lb Date of Birth:  1940-11-27    BSA:          1.913 m Patient Age:    22 years     BP:           163/101 mmHg Patient Gender: F            HR:           100 bpm. Exam Location:  Inpatient Procedure: Limited Echo, Cardiac Doppler and Color Doppler Indications:     Pericardial effusion 423.9 / I31.3  History:         Patient has prior history of Echocardiogram examinations, most                  recent 02/05/2020. Risk Factors:Diabetes, Hypertension and                  Dyslipidemia. Multiple metastatic breast cancer, ,metastatic                  pleural effusion, GERD, Chronic kidney disease.  Sonographer:     Darlina Sicilian RDCS Referring Phys:  [id number] Lequita Halt Diagnosing Phys: Fransico Him MD IMPRESSIONS  1. Left ventricular ejection fraction, by estimation, is 55 to 60%. The left ventricle has normal function. Left ventricular endocardial border not optimally defined to evaluate regional wall motion. Left ventricular diastolic function could not be evaluated.  2. Right ventricular systolic function was not well visualized. The right ventricular size is not well visualized. There is moderately elevated pulmonary artery systolic pressure. The estimated right ventricular systolic pressure is 60.1 mmHg.  3. The mitral valve is normal in structure. No evidence of mitral valve regurgitation. No evidence of mitral stenosis.  4. The aortic valve is normal in structure. Aortic valve regurgitation is not  visualized. No aortic stenosis is present.  5. The inferior vena cava is dilated in size with >50% respiratory  variability, suggesting right atrial pressure of 8 mmHg.  6. A small pericardial effusion is present. The pericardial effusion is circumferential. There is no evidence of cardiac tamponade. FINDINGS  Left Ventricle: Left ventricular ejection fraction, by estimation, is 55 to 60%. The left ventricle has normal function. Left ventricular endocardial border not optimally defined to evaluate regional wall motion. The left ventricular internal cavity size was normal in size. There is no left ventricular hypertrophy. Left ventricular diastolic function could not be evaluated. Right Ventricle: The right ventricular size is not well visualized. Right vetricular wall thickness was not assessed. Right ventricular systolic function was not well visualized. There is moderately elevated pulmonary artery systolic pressure. The tricuspid regurgitant velocity is 2.85 m/s, and with an assumed right atrial pressure of 8 mmHg, the estimated right ventricular systolic pressure is 42.5 mmHg. Left Atrium: Left atrial size was normal in size. Right Atrium: Right atrial size was normal in size. Pericardium: A small pericardial effusion is present. The pericardial effusion is circumferential. There is no evidence of cardiac tamponade. Mitral Valve: The mitral valve is normal in structure. There is mild calcification of the anterior mitral valve leaflet(s). Normal mobility of the mitral valve leaflets. Mild mitral annular calcification. No evidence of mitral valve regurgitation. No evidence of mitral valve stenosis. Tricuspid Valve: The tricuspid valve is normal in structure. Tricuspid valve regurgitation is mild . No evidence of tricuspid stenosis. Aortic Valve: The aortic valve is normal in structure. Aortic valve regurgitation is not visualized. No aortic stenosis is present. Pulmonic Valve: The pulmonic valve was normal in  structure. Pulmonic valve regurgitation is not visualized. No evidence of pulmonic stenosis. Aorta: The aortic root is normal in size and structure. Venous: The inferior vena cava is dilated in size with greater than 50% respiratory variability, suggesting right atrial pressure of 8 mmHg. IAS/Shunts: No atrial level shunt detected by color flow Doppler.  LEFT VENTRICLE PLAX 2D LVIDd:         4.00 cm LVIDs:         2.90 cm LV PW:         1.00 cm LV IVS:        1.10 cm LVOT diam:     2.00 cm LVOT Area:     3.14 cm  LV Volumes (MOD) LV vol d, MOD A2C: 41.1 ml LV vol d, MOD A4C: 66.2 ml LV vol s, MOD A2C: 24.2 ml LV vol s, MOD A4C: 27.8 ml LV SV MOD A2C:     16.9 ml LV SV MOD A4C:     66.2 ml LV SV MOD BP:      28.0 ml LEFT ATRIUM         Index LA diam:    3.70 cm 1.93 cm/m   AORTA Ao Root diam: 3.20 cm TRICUSPID VALVE TR Peak grad:   32.5 mmHg TR Vmax:        285.00 cm/s  SHUNTS Systemic Diam: 2.00 cm Fransico Him MD Electronically signed by Fransico Him MD Signature Date/Time: 03/23/2020/3:43:48 PM    Final (Updated)    IR PERC PLEURAL DRAIN W/INDWELL CATH W/IMG GUIDE  Result Date: 04/12/2020 INDICATION: 79 year old female with a history of metastatic breast cancer and malignant bilateral right larger than left pleural effusions. She presents for placement of a tunneled pleural drainage catheter on the right for palliative management of her recurrent pleural effusion. EXAM: Tunneled pleural drainage catheter placement MEDICATIONS: 2 g Ancef intravenous ANESTHESIA/SEDATION: Fentanyl 75 mcg IV; Versed 1.5 mg  IV Moderate Sedation Time:  21 minutes The patient was continuously monitored during the procedure by the interventional radiology nurse under my direct supervision. COMPLICATIONS: None immediate. PROCEDURE: Informed written consent was obtained from the patient after a thorough discussion of the procedural risks, benefits and alternatives. All questions were addressed. Maximal Sterile Barrier Technique was  utilized including caps, mask, sterile gowns, sterile gloves, sterile drape, hand hygiene and skin antiseptic. A timeout was performed prior to the initiation of the procedure. The right lateral chest was interrogated with ultrasound. A small to moderate residual pleural effusion was identified. A suitable skin entry site was selected and marked. Local anesthesia was attained by infiltration with 1% lidocaine. A small dermatotomy was made. Under real-time ultrasound guidance, an 18 gauge sheath needle was carefully advanced over a rib and into the pleural space. The needle portion was removed. A 0 point 3 5 Amplatz wire was advanced through the sheath and into the pleural space. The wire was secured. A suitable skin exit site approximately 5 cm medial and inferior to the pleural entry site was identified. Local anesthesia was again attained by infiltration with 1% lidocaine. A second dermatotomy was made. The 66 French PleurX drainage catheter was then tunneled from the skin exit site to the dermatotomy overlying the pleural entry site. Attention was turned back to the wire. The 18 French sheath was removed. A 15.5 French peel-away sheath was carefully advanced over the wire and into the pleural space. The wire was removed. The PleurX catheter was advanced through the peel-away sheath and the peel-away sheath was discarded. The catheter was then connected to a vacuum tender and approximately 800 mL of golden pleural fluid was evacuated. Follow-up chest x-ray demonstrates a well-positioned tunneled pleural drainage catheter with resolution of pleural effusion and no evidence of pneumothorax. There is some persistent atelectasis in the region of the right middle lobe. The dermatotomy overlying the pleural access site was then closed with a 3-0 inverted interrupted Vicryl suture. The epidermis was sealed with Dermabond. The catheter was secured to the skin exit site using 0 Prolene suture. Sterile bandages were  applied. The patient tolerated the procedure well. IMPRESSION: Successful placement of a right-sided tunneled pleural drainage catheter. Electronically Signed   By: Jacqulynn Cadet M.D.   On: 04/12/2020 11:11   US Thoracentesis Asp Pleural space w/IMG guide  Result Date: 03/18/2020 INDICATION: Patient with history of metastatic breast cancer, dyspnea, recurrent right pleural effusion. Request made for therapeutic right thoracentesis. EXAM: ULTRASOUND GUIDED THERAPEUTIC RIGHT THORACENTESIS MEDICATIONS: None COMPLICATIONS: None immediate. PROCEDURE: An ultrasound guided thoracentesis was thoroughly discussed with the patient and questions answered. The benefits, risks, alternatives and complications were also discussed. The patient understands and wishes to proceed with the procedure. Written consent was obtained. Ultrasound was performed to localize and mark an adequate pocket of fluid in the right chest. The area was then prepped and draped in the normal sterile fashion. 1% Lidocaine was used for local anesthesia. Under ultrasound guidance a 6 Fr Safe-T-Centesis catheter was introduced. Thoracentesis was performed. The catheter was removed and a dressing applied. FINDINGS: A total of approximately 400 cc of slightly hazy, yellow fluid was removed. IMPRESSION: Successful ultrasound guided therapeutic right thoracentesis yielding 400 cc of pleural fluid. Read by: Rowe Robert, PA-C Electronically Signed   By: Corrie Mckusick D.O.   On: 03/18/2020 10:53    Time coordinating discharge: Over 30 minutes  SIGNED:   Guilford Shi, MD  [facility] 04/13/2020, 11:00 AM

## 2020-04-13 NOTE — Progress Notes (Signed)
Occupational Therapy Treatment Patient Details Name: Wendy [name] MRN: [id number] DOB: 13-Sep-1941 Today's Date: 04/13/2020    History of present illness Wendy [name] is a 79 y.o. female with medical history significant of metastatic breast cancer on chemotherapy, hypertension, stage III CKD, type 2 diabetes, chronic anemia, major depression, anxiety, tobacco use, presents in the emergency department via EMS for shortness of breath.   OT comments  Patient progressing towards acute OT goals. Reporting R side pain from drainage site, states already asked RN for pain medication. Agreeable to seated g/h in recliner, min G for transfer and set up for g/h. Cue for pursed lip breathing techniques after transfer to chair due to heavy mouth breathing.   Follow Up Recommendations  Home health OT    Equipment Recommendations  None recommended by OT       Precautions / Restrictions Precautions Precautions: Fall Precaution Comments: O2 dependent at baseline, monitor O2 Restrictions Weight Bearing Restrictions: No       Mobility Bed Mobility Overal bed mobility: Needs Assistance Bed Mobility: Supine to Sit     Supine to sit: Min assist;HOB elevated     General bed mobility comments: assist to elevate trunk  Transfers Overall transfer level: Needs assistance Equipment used: Rolling walker (2 wheeled) Transfers: Sit to/from Stand Sit to Stand: Min guard              Balance Overall balance assessment: Needs assistance Sitting-balance support: Feet supported Sitting balance-Leahy Scale: Good     Standing balance support: Bilateral upper extremity supported Standing balance-Leahy Scale: Fair Standing balance comment: pt able to lift UE from walker without LOB                           ADL either performed or assessed with clinical judgement   ADL Overall ADL's : Needs assistance/impaired     Grooming: Oral care;Wash/dry face;Wash/dry hands;Set up;Sitting                    Toilet Transfer: Min guard;Cueing for safety;Stand-pivot;RW;BSC Toilet Transfer Details (indicate cue type and reason): to recliner, cue to reach back for chair         Functional mobility during ADLs: Min guard;Rolling walker                 Cognition Arousal/Alertness: Awake/alert Behavior During Therapy: WFL for tasks assessed/performed Overall Cognitive Status: Within Functional Limits for tasks assessed                                                     Pertinent Vitals/ Pain       Pain Assessment: Faces Faces Pain Scale: Hurts even more Pain Location: R flank Pain Descriptors / Indicators: Operative site guarding (from drain) Pain Intervention(s): Repositioned         Frequency  Min 2X/week        Progress Toward Goals  OT Goals(current goals can now be found in the care plan section)  Progress towards OT goals: Progressing toward goals  Acute Rehab OT Goals Patient Stated Goal: breathe better ADL Goals Pt Will Transfer to Toilet: with supervision;ambulating;regular height toilet;grab bars Pt Will Perform Toileting - Clothing Manipulation and hygiene: with supervision;sit to/from stand Pt/caregiver will Perform Home Exercise Program: Both right and left upper  extremity;Increased strength Additional ADL Goal #1: Patient will stand at sink to perform grooming task as evidence of improving activity tolerance  Plan Discharge plan remains appropriate       AM-PAC OT "6 Clicks" Daily Activity     Outcome Measure   Help from another person eating meals?: None Help from another person taking care of personal grooming?: A Little Help from another person toileting, which includes using toliet, bedpan, or urinal?: A Little Help from another person bathing (including washing, rinsing, drying)?: A Little Help from another person to put on and taking off regular upper body clothing?: A Little Help from another person to  put on and taking off regular lower body clothing?: A Little 6 Click Score: 19    End of Session Equipment Utilized During Treatment: Rolling walker  OT Visit Diagnosis: Muscle weakness (generalized) (M62.81);Pain Pain - Right/Left: Right Pain - part of body:  (flank)   Activity Tolerance Patient tolerated treatment well   Patient Left in chair;with call bell/phone within reach;with chair alarm set   Nurse Communication Mobility status;Other (comment) (needs pure wick and chair alarm batteries)        Time: 2224-1146 OT Time Calculation (min): 16 min  Charges: OT General Charges $OT Visit: 1 Visit OT Treatments $Self Care/Home Management : 8-22 mins  Delbert Phenix OT Pager: Phil Campbell 04/13/2020, 12:34 PM

## 2020-04-13 NOTE — TOC Transition Note (Signed)
Transition of Care (TOC) - CM/SW Discharge Note   [facility] ialson Cheryl-aware of d/c today-able to provide [facility] services;[facility] will deliver hospital bed,& 3n1 to home prior patient d/c to home by family-[facility] rep Thedore Mins will arange delivery of dme w/Shannon;pleurx cannister box should be in patient's rm prior d/c for family to take home. Transport home by family. No further CM needs.Transition of Care [facility]) - CM/SW Discharge Note   Patient Details  Name: Wendy [name] MRN: [id number] Date of Birth: 1940/10/03  Transition of Care [facility]) CM/SW Contact:  Dessa Phi, RN Phone Number: 04/13/2020, 10:48 AM   Clinical Narrative:         Barriers to Discharge: No Barriers Identified   Patient Goals and CMS Choice Patient states their goals for this hospitalization and ongoing recovery are:: go home CMS Medicare.gov Compare Post Acute Care list provided to:: Patient Represenative (must comment) Choice offered to / list presented to : Adult Children  Discharge Placement                       Discharge Plan and Services In-house Referral: Clinical Social Work   Post Acute Care Choice: Durable Medical Equipment, Home Health          DME Arranged: 3-N-1                    Social Determinants of Health (SDOH) Interventions     Readmission Risk Interventions Readmission Risk Prevention Plan 04/09/2020  Transportation Screening Complete  Medication Review Press photographer) Complete  HRI or Home Care Consult Complete  SW Recovery Care/Counseling Consult Complete  Palliative Care Screening Complete  Skilled Nursing Facility Complete  Some recent data might be hidden

## 2020-04-13 NOTE — Telephone Encounter (Signed)
Not able to reach office wrong contact number

## 2020-04-14 ENCOUNTER — Telehealth: Payer: Self-pay

## 2020-04-14 NOTE — Telephone Encounter (Signed)
Please advise 

## 2020-04-14 NOTE — Telephone Encounter (Signed)
Can see Friday morning at 11:40 AM

## 2020-04-14 NOTE — Telephone Encounter (Signed)
Pt was released from hospital stay yesterday and was instructed to see PCP within 5 days. There are no openings whatsoever on the schedule for Dr. Yong Channel. What is recommended?

## 2020-04-15 ENCOUNTER — Inpatient Hospital Stay: Payer: Medicare Other | Admitting: Family Medicine

## 2020-04-15 ENCOUNTER — Telehealth: Payer: Self-pay

## 2020-04-15 DIAGNOSIS — J449 Chronic obstructive pulmonary disease, unspecified: Secondary | ICD-10-CM | POA: Diagnosis not present

## 2020-04-15 DIAGNOSIS — E1122 Type 2 diabetes mellitus with diabetic chronic kidney disease: Secondary | ICD-10-CM | POA: Diagnosis not present

## 2020-04-15 DIAGNOSIS — E113419 Type 2 diabetes mellitus with severe nonproliferative diabetic retinopathy with macular edema, unspecified eye: Secondary | ICD-10-CM | POA: Diagnosis not present

## 2020-04-15 DIAGNOSIS — Z794 Long term (current) use of insulin: Secondary | ICD-10-CM | POA: Diagnosis not present

## 2020-04-15 DIAGNOSIS — E785 Hyperlipidemia, unspecified: Secondary | ICD-10-CM | POA: Diagnosis not present

## 2020-04-15 DIAGNOSIS — C782 Secondary malignant neoplasm of pleura: Secondary | ICD-10-CM | POA: Diagnosis not present

## 2020-04-15 DIAGNOSIS — K219 Gastro-esophageal reflux disease without esophagitis: Secondary | ICD-10-CM | POA: Diagnosis not present

## 2020-04-15 DIAGNOSIS — Z79899 Other long term (current) drug therapy: Secondary | ICD-10-CM | POA: Diagnosis not present

## 2020-04-15 DIAGNOSIS — J9601 Acute respiratory failure with hypoxia: Secondary | ICD-10-CM | POA: Diagnosis not present

## 2020-04-15 DIAGNOSIS — F419 Anxiety disorder, unspecified: Secondary | ICD-10-CM | POA: Diagnosis not present

## 2020-04-15 DIAGNOSIS — D6481 Anemia due to antineoplastic chemotherapy: Secondary | ICD-10-CM | POA: Diagnosis not present

## 2020-04-15 DIAGNOSIS — J910 Malignant pleural effusion: Secondary | ICD-10-CM | POA: Diagnosis not present

## 2020-04-15 DIAGNOSIS — I509 Heart failure, unspecified: Secondary | ICD-10-CM | POA: Diagnosis not present

## 2020-04-15 DIAGNOSIS — Z8701 Personal history of pneumonia (recurrent): Secondary | ICD-10-CM | POA: Diagnosis not present

## 2020-04-15 DIAGNOSIS — J9602 Acute respiratory failure with hypercapnia: Secondary | ICD-10-CM | POA: Diagnosis not present

## 2020-04-15 DIAGNOSIS — Z170 Estrogen receptor positive status [ER+]: Secondary | ICD-10-CM | POA: Diagnosis not present

## 2020-04-15 DIAGNOSIS — C50912 Malignant neoplasm of unspecified site of left female breast: Secondary | ICD-10-CM | POA: Diagnosis not present

## 2020-04-15 DIAGNOSIS — Z9981 Dependence on supplemental oxygen: Secondary | ICD-10-CM | POA: Diagnosis not present

## 2020-04-15 DIAGNOSIS — Z938 Other artificial opening status: Secondary | ICD-10-CM | POA: Diagnosis not present

## 2020-04-15 DIAGNOSIS — Z4682 Encounter for fitting and adjustment of non-vascular catheter: Secondary | ICD-10-CM | POA: Diagnosis not present

## 2020-04-15 DIAGNOSIS — F329 Major depressive disorder, single episode, unspecified: Secondary | ICD-10-CM | POA: Diagnosis not present

## 2020-04-15 DIAGNOSIS — I130 Hypertensive heart and chronic kidney disease with heart failure and stage 1 through stage 4 chronic kidney disease, or unspecified chronic kidney disease: Secondary | ICD-10-CM | POA: Diagnosis not present

## 2020-04-15 DIAGNOSIS — N183 Chronic kidney disease, stage 3 unspecified: Secondary | ICD-10-CM | POA: Diagnosis not present

## 2020-04-15 NOTE — Telephone Encounter (Cosign Needed)
Transition Care Management Follow-up Telephone Call  Date of discharge and from where: 04/13/20  How have you been since you were released from the hospital? Pt son Wendy [name] states she is Sore at the port site and feels weak and tired.  Any questions or concerns? No   Items Reviewed:  Did the pt receive and understand the discharge instructions provided? Yes   Medications obtained and verified? Yes   Any new allergies since your discharge? Yes   Dietary orders reviewed? Yes  Do you have support at home? Yes  Son Wendy [name] and His girlfriend Wendy [name].  Also looking into aid services  Functional Questionnaire: (I = Independent and D = Dependent) ADLs: D  Bathing/Dressing- D  Meal Prep- D  Eating- D  Maintaining continence- D  Transferring/Ambulation- D  Managing Meds- D  Follow up appointments reviewed:   PCP Hospital f/u appt confirmed? Yes  Scheduled to see Wendy [name] on 04/16/20 @ 11:40.  [facility] f/u appt confirmed? Yes  Scheduled to see Wendy [name] on 04/29/20 @ 2:00.  Are transportation arrangements needed? No   If their condition worsens, is the pt aware to call PCP or go to the Emergency Dept.? Yes  Was the patient provided with contact information for the PCP's office or ED? Yes  Was to pt encouraged to call back with questions or concerns? Yes

## 2020-04-15 NOTE — Telephone Encounter (Signed)
Please call to put in this app time

## 2020-04-15 NOTE — Telephone Encounter (Signed)
Noted thanks °

## 2020-04-16 ENCOUNTER — Other Ambulatory Visit: Payer: Self-pay

## 2020-04-16 ENCOUNTER — Ambulatory Visit (INDEPENDENT_AMBULATORY_CARE_PROVIDER_SITE_OTHER): Payer: Medicare Other | Admitting: Family Medicine

## 2020-04-16 VITALS — BP 124/68 | HR 78 | Temp 98.3°F | Ht 62.0 in | Wt 190.0 lb

## 2020-04-16 DIAGNOSIS — Z794 Long term (current) use of insulin: Secondary | ICD-10-CM

## 2020-04-16 DIAGNOSIS — C50919 Malignant neoplasm of unspecified site of unspecified female breast: Secondary | ICD-10-CM | POA: Diagnosis not present

## 2020-04-16 DIAGNOSIS — E1169 Type 2 diabetes mellitus with other specified complication: Secondary | ICD-10-CM

## 2020-04-16 DIAGNOSIS — E113419 Type 2 diabetes mellitus with severe nonproliferative diabetic retinopathy with macular edema, unspecified eye: Secondary | ICD-10-CM | POA: Diagnosis not present

## 2020-04-16 DIAGNOSIS — N1831 Chronic kidney disease, stage 3a: Secondary | ICD-10-CM

## 2020-04-16 DIAGNOSIS — E1165 Type 2 diabetes mellitus with hyperglycemia: Secondary | ICD-10-CM

## 2020-04-16 DIAGNOSIS — Z1159 Encounter for screening for other viral diseases: Secondary | ICD-10-CM

## 2020-04-16 DIAGNOSIS — R0602 Shortness of breath: Secondary | ICD-10-CM

## 2020-04-16 DIAGNOSIS — J4540 Moderate persistent asthma, uncomplicated: Secondary | ICD-10-CM

## 2020-04-16 DIAGNOSIS — I10 Essential (primary) hypertension: Secondary | ICD-10-CM | POA: Diagnosis not present

## 2020-04-16 DIAGNOSIS — J910 Malignant pleural effusion: Secondary | ICD-10-CM

## 2020-04-16 DIAGNOSIS — E785 Hyperlipidemia, unspecified: Secondary | ICD-10-CM | POA: Diagnosis not present

## 2020-04-16 DIAGNOSIS — IMO0002 Reserved for concepts with insufficient information to code with codable children: Secondary | ICD-10-CM

## 2020-04-16 DIAGNOSIS — D649 Anemia, unspecified: Secondary | ICD-10-CM | POA: Diagnosis not present

## 2020-04-16 LAB — COMPREHENSIVE METABOLIC PANEL
ALT: 5 U/L (ref 0–35)
AST: 10 U/L (ref 0–37)
Albumin: 3.1 g/dL — ABNORMAL LOW (ref 3.5–5.2)
Alkaline Phosphatase: 141 U/L — ABNORMAL HIGH (ref 39–117)
BUN: 15 mg/dL (ref 6–23)
CO2: 36 mEq/L — ABNORMAL HIGH (ref 19–32)
Calcium: 8.2 mg/dL — ABNORMAL LOW (ref 8.4–10.5)
Chloride: 97 mEq/L (ref 96–112)
Creatinine, Ser: 1.27 mg/dL — ABNORMAL HIGH (ref 0.40–1.20)
GFR: 49.14 mL/min — ABNORMAL LOW (ref >60.00)
Glucose, Bld: 178 mg/dL — ABNORMAL HIGH (ref 70–99)
Potassium: 3.9 mEq/L (ref 3.5–5.1)
Sodium: 136 mEq/L (ref 135–145)
Total Bilirubin: 0.7 mg/dL (ref 0.2–1.2)
Total Protein: 5.4 g/dL — ABNORMAL LOW (ref 6.0–8.3)

## 2020-04-16 LAB — CBC WITH DIFFERENTIAL/PLATELET
Basophils Absolute: 0.1 10*3/uL (ref 0.0–0.1)
Basophils Relative: 1 % (ref 0.0–3.0)
Eosinophils Absolute: 0.2 10*3/uL (ref 0.0–0.7)
Eosinophils Relative: 4.4 % (ref 0.0–5.0)
HCT: 24.3 % — ABNORMAL LOW (ref 36.0–46.0)
Hemoglobin: 8.5 g/dL — ABNORMAL LOW (ref 12.0–15.0)
Lymphocytes Relative: 9.2 % — ABNORMAL LOW (ref 12.0–46.0)
Lymphs Abs: 0.5 10*3/uL — ABNORMAL LOW (ref 0.7–4.0)
MCHC: 34.9 g/dL (ref 30.0–36.0)
MCV: 91.5 fl (ref 78.0–100.0)
Monocytes Absolute: 0.8 10*3/uL (ref 0.1–1.0)
Monocytes Relative: 14.9 % — ABNORMAL HIGH (ref 3.0–12.0)
Neutro Abs: 4 10*3/uL (ref 1.4–7.7)
Neutrophils Relative %: 70.5 % (ref 43.0–77.0)
Platelets: 204 10*3/uL (ref 150.0–400.0)
RBC: 2.65 Mil/uL — ABNORMAL LOW (ref 3.87–5.11)
RDW: 19.9 % — ABNORMAL HIGH (ref 11.5–15.5)
WBC: 5.6 10*3/uL (ref 4.0–10.5)

## 2020-04-16 LAB — LIPID PANEL
Cholesterol: 183 mg/dL (ref 0–200)
HDL: 47.2 mg/dL (ref >39.00)
LDL Cholesterol: 106 mg/dL — ABNORMAL HIGH (ref 0–99)
NonHDL: 135.64
Total CHOL/HDL Ratio: 4
Triglycerides: 147 mg/dL (ref 0.0–149.0)
VLDL: 29.4 mg/dL (ref 0.0–40.0)

## 2020-04-16 MED ORDER — BENZONATATE 100 MG PO CAPS: 100.0000 mg | ORAL_CAPSULE | Freq: Three times a day (TID) | ORAL | 2 refills | Status: AC | PRN

## 2020-04-16 MED ORDER — OXYCODONE HCL 5 MG PO TABS: 5.0000 mg | ORAL_TABLET | Freq: Four times a day (QID) | ORAL | 0 refills | Status: AC | PRN

## 2020-04-16 NOTE — Patient Instructions (Addendum)
Please stop by lab before you go If you have mychart- we will send your results within 3 business days of [name] receiving them.  If you do not have mychart- we will call you about results within 5 business days of [name] receiving them.   Refilled pain medicine   Recommended follow up: Return in about 3 months (around 07/17/2020) for follow up- or sooner if needed.

## 2020-04-16 NOTE — Progress Notes (Signed)
Phone [phone]   Subjective:  Wendy [name] is a 79 y.o. year old very pleasant female patient who presents for transitional care management and hospital follow up for acute respiratory failure with hypoxia and hypercapnia related to pleural effusion potentially related to ongoing cancer battle. Patient was hospitalized from 04/08/2020 to 04/13/2020. A TCM phone call was completed on 04/15/2020. Medical complexity moderate.  Hospitalization summarized below  79 year old female with history of metastatic breast cancer on chemotherapy, diabetes, hypertension, CKD stage III, chronic anemia who presented to the emergency department with shortness of breath.  She had a recent hospitalization in June for community-acquired pneumonia and since that time patient has been using oxygen at 4 L.  On the day of admission she was found at home to have significant difficulty breathing at her facility-oxygen saturation was 50% on 4 L and improved to 60-70% when oxygen was increased to 8 L.  Patient was hypothermic when EMS arrived.  She was placed on 10 L oxygen via nonrebreather.  Emergency room chest x-ray showed moderate right pleural effusion and bibasilar atelectasis.  Pulmonary nodular lesions in the upper lobes larger on the right than on the left noted consistent with metastatic disease.  She previously had been noted to have a pleural effusion but not significant enough for Pleurx catheter-during this hospitalization Pleurx catheter was inserted by interventional radiology on July 12.  Shortness of breath likely combination of metastatic cancer as well as pleural effusion and atelectasis as well as asthma.  Her breathing did improve on Solu-Medrol-she was eventually able to be weaned down to 4 L of oxygen again.  She was treated with Lasix 20 mg daily as well.  Pulmonology was consulted and they recommended discontinuing antibiotics which had initially been started and then a Pleurx catheter was placed on the 12th  as above.  Extensive counseling on Pleurx catheter management at home was given. -Dr. Vaughan Browner was to be outpatient pulmonology follow-up and she was to follow-up with palliative care at [facility].  She did have pain at the site of Pleurx catheter which was not initially well controlled by tramadol or hydrocodone.  IV morphine x1 was given and later oxycodone was found to be more helpful. Prior to pleurx catheter  Was doing tramadol every 6 hours- may be able to convert back in long run.  -tessalon perles helpful for cough- requests refill -remains on breo for asthma- not having to use albuterol at home. On singulair as well.   For metastatic breast cancer-plan was for continued chemotherapy outpatient-next scheduled July 29.  She is also to restart Xeloda on Monday, July 19 and she will be 7 days on and 7 days off.  Patient did have acute kidney injury with creatinine up to 2.0 but trended back down towards 1.3 near discharge.  Her lisinopril hydrochlorothiazide was discontinued due to risk of AKI-later restarted on lower dose lisinopril  Hypertension-patient was continued on carvedilol, clonidine, amlodipine, Lasix.  As above lisinopril hydrochlorothiazide stopped and then later restarted on lower dose lisinopril 5 mg  Diabetes-poorly controlled prior to hospitalization and insulin has been increased to 32 units from 28 units before hospitalization.  At discharge was instructed to take 30 units and take 2 to 3 units of premeal insulin if blood sugar above 250 plus has sliding scale can use.   Patient with chronic anemia-likely due to chronic blood loss in setting of malignant effusion.  Consideration for transfusion will be if hemoglobin lower than 7.  Hyperlipidemia-patient maintained on  statin but due to advanced malignancy notes were made to consider discontinuing   amedysis home health is helping since being home. Has felt better since being home. Oxycodone every 8-12 hours. Doing best to get  up and moving. Eating ok.   Independent review of portable chest x-ray April 11, 2020-right sided effusion noted.  No bony abnormalities.  Cardiac silhouette largely stable multiple pulmonary nodules in bilateral upper lobes   See problem oriented charting as well  Past Medical History-  Patient Active Problem List   Diagnosis Date Noted  . DNR (do not resuscitate) 03/24/2020    Priority: High  . 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 Metastatic breast cancer ([place]) 03/02/2020    Priority: High  . Shortness of breath 03/14/2017    Priority: High  . Malignant neoplasm of upper-outer quadrant of left breast in female, estrogen receptor positive ([place]) 11/20/2016    Priority: High  . 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 Moderate persistent asthma 01/31/2020    Priority: Medium  . GERD (gastroesophageal reflux disease) 01/31/2020    Priority: Medium  . Lymphedema of left arm 09/03/2019    Priority: Medium  . 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[place]) 12/05/2018    Priority: Medium  . Anemia 01/09/2018    Priority: Medium  . Rectal bleeding 01/17/2017    Priority: Medium  . CKD (chronic kidney disease) stage 3, GFR 30-59 ml/min (HCC) 03/17/2016    Priority: Medium  . TOBACCO USE 04/13/2008    Priority: Medium  . Hyperlipidemia associated with type 2 diabetes mellitus ([place]) 11/29/2006    Priority: Medium  . Morbid obesity ([place]) 11/29/2006    Priority: Medium  . Essential hypertension 11/29/2006    Priority: Medium  . Advanced care planning/counseling discussion     Priority: Low  . Allergic rhinitis 01/31/2020    Priority: Low  . Goals of care, counseling/discussion 01/08/2020    Priority: Low  . Fatigue 03/14/2017    Priority: Low  . Edema 03/14/2017    Priority: Low  . Port catheter in place 01/18/2017    Priority:  Low  . Constipation 01/17/2017    Priority: Low  . 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 Palliative care by specialist   . Grief 05/10/2018    Medications- reviewed and updated  A medical reconciliation was performed comparing current medicines to hospital discharge medications. Current Outpatient Medications  Medication Sig Dispense Refill  . albuterol (VENTOLIN HFA) 108 (90 Base) MCG/ACT inhaler Inhale 2 puffs into the lungs every 6 (six) hours as needed for wheezing or shortness of breath. 18 g 2  . amLODipine (NORVASC) 10 MG tablet TAKE 1 TABLET BY MOUTH EVERY DAY (Patient taking differently: Take 10 mg by mouth daily. ) 90 tablet 1  . B-D INS SYRINGE 0.5CC/31GX5/16 31G X 5/16" 0.5 ML MISC USE AS DIRECTED 4 TIMES A DAY 300 each 1  . benzonatate (TESSALON PERLES) 100 MG capsule Take 1 capsule (100 mg total) by mouth 3 (three) times daily as needed for cough. 30 capsule 2  . busPIRone (BUSPAR) 10 MG tablet Take 1 tablet (10 mg total) by mouth 3 (three) times daily. 270 tablet 1  . carvedilol (COREG) 25 MG tablet Take 1 tablet (25 mg total) by mouth 2 (two) times daily with a meal. 180 tablet 3  . cloNIDine (CATAPRES)  0.3 MG tablet TAKE 1 TABLET (0.3 MG TOTAL) BY MOUTH 2 (TWO) TIMES DAILY. 180 tablet 1  . docusate sodium (COLACE) 100 MG capsule Take 1 capsule (100 mg total) by mouth 2 (two) times daily. 10 capsule 0  . famotidine (PEPCID) 20 MG tablet TAKE 1 TABLET BY MOUTH EVERY DAY (Patient taking differently: Take 20 mg by mouth daily. ) 90 tablet 1  . fluticasone (FLONASE) 50 MCG/ACT nasal spray Place 1 spray into both nostrils daily. (Patient taking differently: Place 1 spray into both nostrils daily as needed for allergies or rhinitis. ) 16 g 2  . fluticasone furoate-vilanterol (BREO ELLIPTA) 100-25 MCG/INH AEPB Inhale 1 puff into the lungs daily. 14 each 0  . furosemide (LASIX) 20 MG tablet Take 20 mg by mouth daily.    . insulin glargine (LANTUS SOLOSTAR) 100 UNIT/ML  Solostar Pen Inject 30 Units into the skin daily. Inject 32 units into the skin in am 10 pen 3  . Insulin Pen Needle 31G X 5 MM MISC Use pen needles for insulin injection daily 100 each 3  . insulin regular (NOVOLIN R) 100 units/mL injection Inject 0.02-0.03 mLs (2-3 Units total) into the skin 3 (three) times daily before meals. If BG persistently greater than 250 on QID accuchecks at home. Can use as Sliding scale insulin as well 20 mL 11  . Insulin Syringe-Needle U-100 (B-D INS SYRINGE 0.5CC/30GX1/2") 30G X 1/2" 0.5 ML MISC USE AS DIRECTED 4 TIMES A DAY 300 each 3  . lisinopril (ZESTRIL) 5 MG tablet Take 1 tablet (5 mg total) by mouth daily. 30 tablet 11  . lovastatin (MEVACOR) 40 MG tablet TAKE 1 TABLET BY MOUTH EVERYDAY AT BEDTIME (Patient taking differently: Take 40 mg by mouth at bedtime. ) 90 tablet 3  . montelukast (SINGULAIR) 10 MG tablet Take 1 tablet (10 mg total) by mouth at bedtime. 30 tablet 3  . ondansetron (ZOFRAN) 8 MG tablet Take 1 tablet (8 mg total) by mouth 2 (two) times daily as needed (Nausea or vomiting). 30 tablet 1  . oxyCODONE (OXY IR/ROXICODONE) 5 MG immediate release tablet Take 1 tablet (5 mg total) by mouth every 6 (six) hours as needed for moderate pain or severe pain. Anticipate ongoing need for pain in cancer patient with pleurx catheter- im refilling early as Im out of town next week. 20 tablet 0  . polyethylene glycol powder (GLYCOLAX/MIRALAX) 17 GM/SCOOP powder Take 17 g by mouth daily. (Patient taking differently: Take 17 g by mouth as needed for mild constipation or moderate constipation. ) 1 g 0  . prochlorperazine (COMPAZINE) 10 MG tablet Take 1 tablet (10 mg total) by mouth every 6 (six) hours as needed for nausea or vomiting. 30 tablet 1  . XELODA 500 MG tablet Take 3 tablets (1,500 mg total) by mouth 2 (two) times daily after a meal. Take for 14 days, then hold for 7 days. Repeat every 21 days. 84 tablet 1   No current facility-administered medications for  this visit.   Objective  Objective:  BP 124/68   Pulse 78   Temp 98.3 F (36.8 C)   Ht 5\' 2"  (1.575 m)   Wt 190 lb (86.2 kg)   LMP  (LMP Unknown)   HC 99" (251.5 cm)   SpO2 99%   BMI 34.75 kg/m  Gen: NAD, resting comfortably CV: RRR no murmurs rubs or gallops Lungs: CTAB no crackles, wheeze, rhonchi Abdomen: soft/nontender/nondistended/normal bowel sounds. Ext: trace to 1+ edema Skin: warm, dry  Neuro: wheelchair bound   Assessment and Plan:   1. Malignant pleural effusion Patient states doing much better with Pleurx catheter-[name] feels well equipped to care for this with help from home health.  From a palliative perspective they have opted to drain every few days instead of daily as uncomfortable for patient unless she begins to have more shortness of breath.  I think that's reasonable.  Tessalon helps with cough and they requested refill -In regards to pain control with Pleurx catheter site-I refilled her oxycodone today as I will be out of the office next week-she is using this about twice a day and we discussed could trial substituting a dose of tramadol instead of oxycodone sometime in the next week or 2 and see how she does-would love if we could transition back to tramadol if she tolerates this-I really want her to be comfortable with all she is suffering from though  2. Shortness of breath Much improved.  Solu-Medrol during hospitalization likely help with asthma component.  Reportedly had 800 cc of effusion removed which also has been helpful.  She is now using 4 L oxygen by nasal cannula  3. Metastatic breast cancer [facility]) I asked patient with recent hospitalization if this changed her perspective on how aggressive she would like to be with palliative breast cancer treatment.  She would like to continue her current regimen for now.  We discussed if she changes her mind we can always transition to hospice based approach  4. Essential hypertension Due to AKI  in hospital lisinopril and hydrochlorothiazide were stopped.  Blood pressure increased on lisinopril was restarted at lower dose 5 mg.  Otherwise she was continued on on carvedilol, clonidine, amlodipine, Lasix 20 mg.  Blood pressure well controlled today even without hydrochlorothiazide thankfully  5. 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  [facility] caregiver has a sliding scale she can use with meals as well  6. Moderate persistent asthma without complication Appears well-controlled-no wheezing today.  Continue Breo and albuterol as needed  7. Hyperlipidemia associated with type 2 diabetes mellitus ([place]) Discussed potential palliative approach and stopping cholesterol medicine-patient declines for now-update lipid panel has have been over a year-I would be unlikely to increase cholesterol medicine unless perhaps LDL above 130 but I suspect will be under 100 Lab Results  Component Value Date   CHOL 183 04/01/2019   HDL 55.70 04/01/2019   LDLCALC 93 04/01/2019   LDLDIRECT 140.0 09/12/2018   TRIG 171.0 (H) 04/01/2019   CHOLHDL 3 04/01/2019     8. Stage 3a chronic kidney disease As noted had AKI in the hospital-hopefully remains improved.  Update CMP today with labs  9. Anemia, unspecified type Consideration of transfusion if hemoglobin gets to 7 or has significant shortness of breath-patient/family would like to update CBC today  #- hospital bed thin and feels has hard time fitting. They are going to talk to home health- id sign for other options even bariatric bed if qualifies. With effusion- helpful to be able to lift head of bed up- much more difficult with mobility  Recommended follow up: Return in about 3 months (around 07/17/2020) for follow up- or sooner if needed. Future Appointments  Date Time Provider [place]  04/29/2020  1:30 PM  [facility] 1 [facility] None  04/29/2020  2:00 PM [name], MD [facility] None  05/31/2020  2:45 PM Mannam,  Praveen, MD [facility] None    Lab/Order associations:   ICD-10-CM   1. Malignant pleural effusion  J91.0   2. Shortness of breath  R06.02   3. Metastatic breast cancer ([place])  C50.919   4. Essential hypertension  I10 CBC with Differential/Platelet    Comprehensive metabolic panel    Lipid panel  5. 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 Moderate persistent asthma without complication  U20.25   7. Hyperlipidemia associated with type 2 diabetes mellitus (HCC)  E11.69    E78.5   8. Stage 3a chronic kidney disease  N18.31   9. Anemia, unspecified type  D64.9   10. Encounter for hepatitis C screening test for low risk patient  Z11.59 Hepatitis C antibody   Meds ordered this encounter  Medications  . benzonatate (TESSALON PERLES) 100 MG capsule    Sig: Take 1 capsule (100 mg total) by mouth 3 (three) times daily as needed for cough.    Dispense:  30 capsule    Refill:  2  . oxyCODONE (OXY IR/ROXICODONE) 5 MG immediate release tablet    Sig: Take 1 tablet (5 mg total) by mouth every 6 (six) hours as needed for moderate pain or severe pain. Anticipate ongoing need for pain in cancer patient with pleurx catheter- im refilling early as Im out of town next week.    Dispense:  20 tablet    Refill:  0    Return precautions advised.  Garret Reddish, MD

## 2020-04-16 NOTE — Assessment & Plan Note (Signed)
Due to AKI in hospital lisinopril and hydrochlorothiazide were stopped.  Blood pressure increased on lisinopril was restarted at lower dose 5 mg.  Otherwise she was continued on on carvedilol, clonidine, amlodipine, Lasix 20 mg.  Blood pressure well controlled today even without hydrochlorothiazide thankfully

## 2020-04-19 DIAGNOSIS — J9602 Acute respiratory failure with hypercapnia: Secondary | ICD-10-CM | POA: Diagnosis not present

## 2020-04-19 DIAGNOSIS — J910 Malignant pleural effusion: Secondary | ICD-10-CM | POA: Diagnosis not present

## 2020-04-19 DIAGNOSIS — J9601 Acute respiratory failure with hypoxia: Secondary | ICD-10-CM | POA: Diagnosis not present

## 2020-04-19 DIAGNOSIS — C782 Secondary malignant neoplasm of pleura: Secondary | ICD-10-CM | POA: Diagnosis not present

## 2020-04-19 DIAGNOSIS — I130 Hypertensive heart and chronic kidney disease with heart failure and stage 1 through stage 4 chronic kidney disease, or unspecified chronic kidney disease: Secondary | ICD-10-CM | POA: Diagnosis not present

## 2020-04-19 DIAGNOSIS — C50912 Malignant neoplasm of unspecified site of left female breast: Secondary | ICD-10-CM | POA: Diagnosis not present

## 2020-04-19 LAB — HEPATITIS C ANTIBODY
Hepatitis C Ab: NONREACTIVE
SIGNAL TO CUT-OFF: 0.01 (ref <1.00)

## 2020-04-20 ENCOUNTER — Other Ambulatory Visit: Payer: Self-pay | Admitting: Hematology

## 2020-04-20 ENCOUNTER — Telehealth: Payer: Self-pay | Admitting: Family Medicine

## 2020-04-20 ENCOUNTER — Telehealth: Payer: Self-pay

## 2020-04-20 DIAGNOSIS — J910 Malignant pleural effusion: Secondary | ICD-10-CM | POA: Diagnosis not present

## 2020-04-20 DIAGNOSIS — I130 Hypertensive heart and chronic kidney disease with heart failure and stage 1 through stage 4 chronic kidney disease, or unspecified chronic kidney disease: Secondary | ICD-10-CM | POA: Diagnosis not present

## 2020-04-20 DIAGNOSIS — J9601 Acute respiratory failure with hypoxia: Secondary | ICD-10-CM | POA: Diagnosis not present

## 2020-04-20 DIAGNOSIS — C50912 Malignant neoplasm of unspecified site of left female breast: Secondary | ICD-10-CM | POA: Diagnosis not present

## 2020-04-20 DIAGNOSIS — C782 Secondary malignant neoplasm of pleura: Secondary | ICD-10-CM | POA: Diagnosis not present

## 2020-04-20 DIAGNOSIS — J9602 Acute respiratory failure with hypercapnia: Secondary | ICD-10-CM | POA: Diagnosis not present

## 2020-04-20 NOTE — Telephone Encounter (Signed)
Eval has been performed and Physical therapist would like to request PT for 2x a week for 3 weeks 1x a week for 4 weeks  She would also like to have a medical social work eval completed.

## 2020-04-20 NOTE — Telephone Encounter (Signed)
See other message orders given

## 2020-04-20 NOTE — Telephone Encounter (Signed)
Called verbal orders given will call if any questions.

## 2020-04-20 NOTE — Telephone Encounter (Signed)
Requesting 2 week 2 and 1 week 6 for skilled nurse, PT and OT. Can leave a message on VM.

## 2020-04-21 ENCOUNTER — Other Ambulatory Visit: Payer: Medicare Other

## 2020-04-21 ENCOUNTER — Other Ambulatory Visit: Payer: Self-pay

## 2020-04-21 DIAGNOSIS — I130 Hypertensive heart and chronic kidney disease with heart failure and stage 1 through stage 4 chronic kidney disease, or unspecified chronic kidney disease: Secondary | ICD-10-CM | POA: Diagnosis not present

## 2020-04-21 DIAGNOSIS — J9601 Acute respiratory failure with hypoxia: Secondary | ICD-10-CM | POA: Diagnosis not present

## 2020-04-21 DIAGNOSIS — J910 Malignant pleural effusion: Secondary | ICD-10-CM | POA: Diagnosis not present

## 2020-04-21 DIAGNOSIS — Z515 Encounter for palliative care: Secondary | ICD-10-CM

## 2020-04-21 DIAGNOSIS — C50912 Malignant neoplasm of unspecified site of left female breast: Secondary | ICD-10-CM | POA: Diagnosis not present

## 2020-04-21 DIAGNOSIS — C782 Secondary malignant neoplasm of pleura: Secondary | ICD-10-CM | POA: Diagnosis not present

## 2020-04-21 DIAGNOSIS — J9602 Acute respiratory failure with hypercapnia: Secondary | ICD-10-CM | POA: Diagnosis not present

## 2020-04-21 NOTE — Progress Notes (Signed)
COMMUNITY PALLIATIVE CARE SW NOTE  PATIENT NAME: Wendy [name] DOB: July 01, 1941 MRN: [id number]  PRIMARY CARE PROVIDER: Marin Olp, MD  RESPONSIBLE PARTY:  Acct ID - Guarantor Home Phone Work Phone Relationship Acct Type  [id number] WENDE, LONGSTRETH [phone]  Self P/F     [address], [place], [place] [address]     PLAN OF CARE and INTERVENTIONS:             GOALS OF CARE/ ADVANCE CARE PLANNING: Patient is a DNR. Most form completed. Son is HCPOA. Patients goal is to remain in home and continue oral chemo treatment.  SOCIAL/EMOTIONAL/SPIRITUAL ASSESSMENT/ INTERVENTIONS:  SW and RN met with patient and patients DIL in the home for scheduled visit. Patient sitting in recliner and had just had breakfast. Patient shared that she is doing and feeling a lot better. Patient had pleurex cath placed and fluid has decreased, DIL drains pleurex weekly. Patient has received hospital bed and states that it is taking her time to get used to it, DIL shared that they have outreached DR. Hunters office to possibly receive a bari hospital bed. Patients breathing has improved and continues on 4LPM of O2. DIL shared that she would like to taper patient off of oxycotin due to the side effects and transition back to tramadol. Patient shared that she is sleeping well and has a good appetite. DIL will be out of town for a week and wants to ensure that patients care is continued. Patient shared that DIL assist with bathing and dressing due to her legs still being weak. Patient continues on pill chemo (2 weeks on and one week off). Patient and DIL hs not other concerns for palliative care at this time. Palliative care will continue to follow.  PATIENT/CAREGIVER EDUCATION/ COPING:  Patient is A&O x5 and engaged in visit. Patient is  PERSONAL EMERGENCY PLAN:  N/a COMMUNITY RESOURCES COORDINATION/ HEALTH CARE NAVIGATION:  N/a FINANCIAL/LEGAL CONCERNS/INTERVENTIONS:  N/a     SOCIAL HX:  Social History   Tobacco  Use   Smoking status: Never Smoker   Smokeless tobacco: Former Systems developer    Types: Snuff  Substance Use Topics   Alcohol use: No    Alcohol/week: 0.0 standard drinks    CODE STATUS:   Code Status: Prior  ADVANCED DIRECTIVES: N MOST FORM COMPLETE:: n/a HOSPICE EDUCATION PROVIDED: n/a PPS:n/a     [place] Henrene Pastor, LCSW

## 2020-04-21 NOTE — Progress Notes (Signed)
COMMUNITY PALLIATIVE CARE SW NOTE  PATIENT NAME: Wendy [name] DOB: Oct 19, 1940 MRN: [id number]  PRIMARY CARE PROVIDER: Marin Olp, MD  RESPONSIBLE PARTY:  Acct ID - Guarantor Home Phone Work Phone Relationship Acct Type  [id number] Wendy [name] [phone]  Self P/F     [address], [place], [place] [address]     PLAN OF CARE and INTERVENTIONS:             1. GOALS OF CARE/ ADVANCE CARE PLANNING: Patient is a DNR. Most form completed. Son is HCPOA. Patients goal is to remain in home and continue oral chemo treatment.  SOCIAL/EMOTIONAL/SPIRITUAL ASSESSMENT/ INTERVENTIONS:  SW and RN met with patient and patients DIL in the home for scheduled visit. Patient sitting in recliner and had just had breakfast. Patient shared that she is doing and feeling a lot better. Patient had pleurex cath placed and fluid has decreased, DIL drains pleurex weekly. Patient has received hospital bed and states that it is taking her time to get used to it, DIL shared that they have outreached DR. Hunters office to possibly receive a bari hospital bed. Patients breathing has improved and continues on 4LPM of O2. DIL shared that she would like to taper patient off of oxycotin due to the side effects and transition back to tramadol. Patient shared that she is sleeping well and has a good appetite. DIL will be out of town for a week and wants to ensure that patients care is continued. Patient shared that DIL assist with bathing and dressing due to her legs still being weak. Patient continues on pill chemo (2 weeks on and one week off). Patient and DIL has no other concerns for palliative care at this time. Palliative care will continue to follow. Patient is A&O x5 and engaged in visit. Patient is coping well with breast ca dx and has not expressed any s/s of depression. Patients family continues to be supportive. 2. PATIENT/CAREGIVER EDUCATION/ COPING:  Patient is A&O x5 and engaged in visit. Patient is coping well with  breast ca dx and has not expressed any s/s of depression. Patients family continues to be supportive. 3. PERSONAL EMERGENCY PLAN:  Family to call 911 for emergencies. 4. COMMUNITY RESOURCES COORDINATION/ HEALTH CARE NAVIGATION: Family assist with managing care. Patient has switched to [facility] from [facility] for PT/OT/Nurisng services. 5. FINANCIAL/LEGAL CONCERNS/INTERVENTIONS: Patients MCD application still in progress, awaiting on medical bills from hospital to submit with application.      SOCIAL HX:  Social History   Tobacco Use  . Smoking status: Never Smoker  . Smokeless tobacco: Former Systems developer    Types: Snuff  Substance Use Topics  . Alcohol use: No    Alcohol/week: 0.0 standard drinks    CODE STATUS: DNR  ADVANCED DIRECTIVES: Y MOST FORM COMPLETE:  Y HOSPICE EDUCATION PROVIDED: N  PPS: Patient is able to SPT with RW to [facility]. DI assist with all ADL's. Patient is able to feed self.   Time Spent: visit lasted 65mn      Wendy [name]

## 2020-04-21 NOTE — Progress Notes (Signed)
PATIENT NAME: Wendy [name] DOB: Dec 16, 1940 MRN: [id number]  PRIMARY CARE PROVIDER: Marin Olp, MD  RESPONSIBLE PARTY:  Acct ID - Guarantor Home Phone Work Phone Relationship Acct Type  [id number] Wendy [name] [phone]  Self P/F     [address], [place], [place] [id number]    PLAN OF CARE and INTERVENTIONS:               1.  GOALS OF CARE/ ADVANCE CARE PLANNING:  Remain in home and continue to receive oral chemo.               2.  PATIENT/CAREGIVER EDUCATION:  Education on fall precautions, education on s/s of infection, reviewed meds, support               3.  DISEASE STATUS:  SW [place] and RN made scheduled palliative care home visit.  Palliative care team met with patient and her caregiver Wendy [name]. Patient had pleurx catheter placed and Wendy [name] drains pleurx as needed. Wendy [name] reports 800 ml's were removed last week and she removed 75 ml yesterday. Patients breathing is much better and patient resumed oral chemotherapy drug Xeloda on 04/19/20.  Patient is on oral chemo for 2 weeks then off for 1 week. Patient receiving O2 via nasal cannula at 4 liters per minute. Patient reports she feels better overall and breathing is much better since pleurx catheter was placed. Patients vital signs are stable and patients O2 SATs on 4 liters of oxygen currently 99%. Patients blood sugar readings today 69 before breakfast and 133 after breakfast per Wendy [name]. Patient denies having any pain at the present time. Wendy [name] reports patient continues to use oxycodone for pain. Wendy [name] reports they discussed tapering patient off oxycodone and using Tramadol for pain as oxycodone causes patient to have hallucinations and confusion. Patient received her hospital bed and feels like she is getting more used to it. Wendy [name] reports MD is working on attempting to obtain patient a bariatric bed. Patient is able to walk to the bathroom with assist.  Wendy [name] reports that patient is back to her baseline. Patient reports  her appetite is good. Wendy [name] has been sitting patient on toilet and providing personal care to patient. Patient has not suffered any falls. Patient reports she will sleep well one night and then will not rest well the next night. Patients breath sounds are diminished throughout. Patient has a slight productive cough of clear sputum. Patient report she is receiving [place] through [facility] and currently receiving PT, OT and nursing. Nurse reviewed patient's medications with Wendy [name]. Wendy [name] is planning on leaving to go to [place] however patient will receive nursing visits through [facility]. Nurse reviewed patient's medications with Wendy [name].  Patient and family remain in agreement with palliative care services. Patient family encouraged to contact palliative care with questions or concerns.    HISTORY OF PRESENT ILLNESS:  Patient is a 79 year old female who resides in home with her family.  Patient restarted Xeloda on 04/19/20.  Patient is followed by palliative care.  Patient is seen monthly and PRN     CODE STATUS: DNR  ADVANCED DIRECTIVES: Yes HCPOA MOST FORM: No PPS: 40%   PHYSICAL EXAM:   VITALS: Today's Vitals   04/21/20 1111  Weight: 190 lb (86.2 kg)  PainSc: 0-No pain    LUNGS: decreased breath sounds CARDIAC: Cor RRR  EXTREMITIES: 2+ edema SKIN: Skin color, texture, turgor normal. No rashes or lesions  NEURO: positive for gait problems  and weakness       Nilda Simmer, RN

## 2020-04-22 ENCOUNTER — Other Ambulatory Visit: Payer: Self-pay | Admitting: Primary Care

## 2020-04-22 DIAGNOSIS — J9602 Acute respiratory failure with hypercapnia: Secondary | ICD-10-CM | POA: Diagnosis not present

## 2020-04-22 DIAGNOSIS — C782 Secondary malignant neoplasm of pleura: Secondary | ICD-10-CM | POA: Diagnosis not present

## 2020-04-22 DIAGNOSIS — J910 Malignant pleural effusion: Secondary | ICD-10-CM | POA: Diagnosis not present

## 2020-04-22 DIAGNOSIS — I130 Hypertensive heart and chronic kidney disease with heart failure and stage 1 through stage 4 chronic kidney disease, or unspecified chronic kidney disease: Secondary | ICD-10-CM | POA: Diagnosis not present

## 2020-04-22 DIAGNOSIS — J9601 Acute respiratory failure with hypoxia: Secondary | ICD-10-CM | POA: Diagnosis not present

## 2020-04-22 DIAGNOSIS — C50912 Malignant neoplasm of unspecified site of left female breast: Secondary | ICD-10-CM | POA: Diagnosis not present

## 2020-04-23 ENCOUNTER — Other Ambulatory Visit: Payer: Self-pay | Admitting: Hematology

## 2020-04-23 DIAGNOSIS — J910 Malignant pleural effusion: Secondary | ICD-10-CM | POA: Diagnosis not present

## 2020-04-23 DIAGNOSIS — I130 Hypertensive heart and chronic kidney disease with heart failure and stage 1 through stage 4 chronic kidney disease, or unspecified chronic kidney disease: Secondary | ICD-10-CM | POA: Diagnosis not present

## 2020-04-23 DIAGNOSIS — C50912 Malignant neoplasm of unspecified site of left female breast: Secondary | ICD-10-CM | POA: Diagnosis not present

## 2020-04-23 DIAGNOSIS — C782 Secondary malignant neoplasm of pleura: Secondary | ICD-10-CM | POA: Diagnosis not present

## 2020-04-23 DIAGNOSIS — J9601 Acute respiratory failure with hypoxia: Secondary | ICD-10-CM | POA: Diagnosis not present

## 2020-04-23 DIAGNOSIS — J9602 Acute respiratory failure with hypercapnia: Secondary | ICD-10-CM | POA: Diagnosis not present

## 2020-04-23 MED ORDER — MIRTAZAPINE 7.5 MG PO TABS: 7.5000 mg | ORAL_TABLET | Freq: Every day | ORAL | 2 refills | Status: AC

## 2020-04-23 NOTE — Progress Notes (Signed)
[facility]   [phone] [phone]   Clinic Follow up Note   Patient Care Team: Marin Olp, MD as PCP - General (Family Medicine) Philemon Kingdom, MD as Consulting Physician (Internal Medicine) Jalene Mullet, MD as Consulting Physician (Ophthalmology) Erroll Luna, MD as Consulting Physician (General Surgery) Kyung Rudd, MD as Consulting Physician (Radiation Oncology) [name], MD as Consulting Physician (Hematology) Delice Bison, Charlestine Massed, NP as Nurse Practitioner (Hematology and Oncology) Marshell Garfinkel, MD as Consulting Physician (Pulmonary Disease)  Date of Service:  04/29/2020  CHIEF COMPLAINT: Follow up of metastatic left breast cancer  SUMMARY OF ONCOLOGIC HISTORY: Oncology History Overview Note  Cancer Staging Malignant neoplasm of upper-outer quadrant of left breast in female, estrogen receptor positive ([place]) Staging form: Breast, AJCC 8th Edition - Clinical: Stage IIB (cT2, cN0, cM0, G3, ER: Positive, PR: Negative, HER2: Negative) - Signed by [name], MD on 11/23/2016 - Pathologic stage from 12/28/2016: Stage IIB (pT2, pN1a(sn), cM0, G3, ER: Positive, PR: Negative, HER2: Negative) - Signed by [name], MD on 01/02/2017     Malignant neoplasm of upper-outer quadrant of left breast in female, estrogen receptor positive ([place])  11/01/2016 Mammogram   Diagnostic mammogram and ultrasound of left breast and axilla showed a 3.1 x 2.1 x 1.4 cm (3.3 x 2.0 x 2.7 cm by ultrasound) mass in the upper outer quadrant of the anterior third of the left breast, associated with pleomorphic calcification. There is a 8 mm (1.8cm by [name]) prominent lymph node in the left axilla.   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   11/07/2016 Receptors her2   ER 80% positive, PR negative, HER-2 negative, Ki-67 90%   11/20/2016 Initial Diagnosis    Malignant neoplasm of upper-outer quadrant of left breast in female, estrogen receptor positive ([place])   12/07/2016 Surgery   Left lumpectomy and left axillary sentinel lymph node sampling by Dr. Brantley Stage   12/07/2016 Pathology Results   pT2, pN1 Left Lumpectomy: Grade 3 IDC measuring 3.4 cm, carcinoma broadly present at the superior margin. Grade 3 DCIS. 1 out of 2 left axillary SLN positive for metastatic carcinoma   12/07/2016 Miscellaneous   Mammaprint showed high risk disease, basal type    12/28/2016 Surgery   Re-excision of the previously positive superior margin was negative for malignant cells.    01/04/2017 Surgery   Port inserted   01/18/2017 - 03/22/2017 Chemotherapy   Adjuvant Docetaxel 75 mg/m and Cytoxan 600 mg/m, every 21 days, for total of 4 cycles, with Neulasta on day 2.    04/23/2017 - 05/18/2017 Radiation Therapy   Radiation treatment dates:   04/23/17 - 05/18/17 Administered by Dr. Lisbeth Renshaw  Site/dose:    Left breast/ 42.5 Gy in 17 Fx Boost / 7.5 Gy in 3 Fx   06/12/2017 - 08/14/2019 Anti-estrogen oral therapy   Adjuvant letrozole 1 mg daily, plan for 5-7 years. D/c on 08/14/19 due to local recurrence of left axilla.     07/20/2017 Mammogram   IMPRESSION: No mammographic evidence of malignancy in either breast. 3.8 cm left breast postsurgical loculated seroma.   09/12/2017 Survivorship     09/19/2018 Imaging   Baseline DEXA 09/19/18 ASSESSMENT: The BMD measured at Femur Neck Left is 0.888 g/cm2 with a T-score of -1.1. This patient is considered osteopenic according to [facility]) criteria.   The scan quality is good. L-4 was excluded due  to degenerative changes.   Site Region Measured Date Measured Age YA BMD Significant CHANGE T-score AP Spine  L1-L3      09/19/2018    [age]         -0.7    1.084 g/cm2   DualFemur Neck Left  09/19/2018    [age]         -1.1    0.888 g/cm2   DualFemur Total Mean 09/19/2018    77.2         -0.5    0.946  g/cm2 ASSESSMENT: The probability of a major osteoporotic fracture is 4.5 % within the next ten years.   The probability of hip fracture is 0.8  % within the next 10 years.   06/25/2019 Mammogram   Diagnostic mammogram 06/25/19  IMPRESSION: 1.  Two morphologically abnormal lymph nodes in the left axilla with a lymph node in the lower left axilla measuring 2.4 x 2 x 2.6 cm.   2. Stable lumpectomy changes left breast with no findings of malignancy in either breast.   07/01/2019 Pathology Results   Diagnosis Lymph node, needle/core biopsy, left axilla - POORLY DIFFERENTIATED CARCINOMA CONSISTENT WITH BREAST PRIMARY. - NO LYMPHOID NODAL TISSUE IDENTIFIED. - SEE MICROSCOPIC DESCRIPTION. Microscopic Comment The carcinoma is consistent with grade III.  PROGNOSTIC INDICATORS Results: IMMUNOHISTOCHEMICAL AND MORPHOMETRIC ANALYSIS PERFORMED MANUALLY The tumor cells are NEGATIVE for Her2 (1+). Estrogen Receptor: 80%, POSITIVE, WEAK STAINING INTENSITY Progesterone Receptor: 0%, NEGATIVE Proliferation Marker Ki67: 80%   07/18/2019 Breast MRI   Breast MRI 07/18/19  IMPRESSION: 1. Two morphologically abnormal level 1 left axillary lymph nodes correlating with biopsy-proven malignancy. 2. No MRI evidence of malignancy in either breast. 3. Postsurgical changes of the left breast consistent with prior lumpectomy.   07/25/2019 PET scan   PET 07/25/19  IMPRESSION: 1. Hypermetabolic LEFT axillary mass consistent with metastatic breast cancer. 2. High LEFT axillary/posterior triangle (level 5) lymph node consistent with local metastasis. 3. No central mediastinal nodal metastasis. No suspicious pulmonary nodules. 4. No distant soft tissue metastasis or skeletal metastasis. 5. Calcified fibroid uterus   08/05/2019 Surgery   LEFT AXILLARY LYMPH NODE DISSECTION by Dr. Brantley Stage 08/05/19    08/05/2019 Pathology Results   FINAL MICROSCOPIC DIAGNOSIS: 08/05/19    A. LYMPH NODES, LEFT AXILLARY,  DISSECTION:  - Metastatic carcinoma.  - See comment.    08/2019 - 01/13/2020 Anti-estrogen oral therapy   Exemestane 4m starting 08/2019. Stopped with the start of chemo in 01/2020   09/01/2019 Genetic Testing   [employer] [facility] Genomic Findings:  -RET amplification -CDK8 amplification -NOTCH1 deletion  -TGB15splice site [id number]>Y  10/73/7106Imaging   CT Angio Chest   IMPRESSION: 1. No definitive pulmonary embolism. 2. Multiple new small bilateral pulmonary nodules consistent with metastatic disease. 3. Soft tissue density in the left axilla at the site of node dissection, probably representing scarring but I cannot exclude tumor recurrence. 4. New partially healed fracture of the posterior aspect of the right eighth rib.   12/04/2019 - 01/13/2020 Chemotherapy   Verzenio 1578mBID starting 12/04/19. Stopped a week before the start of chemo in 01/2020   01/07/2020 PET scan   IMPRESSION: 1. Marked worsening of disease with soft tissue recurrence in the left axilla, new and enlarging lymph nodes in the supraclavicular/high axillary region, new mediastinal adenopathy and new pulmonary pleural and parenchymal disease. 2. Metastatic disease to the right hepatic lobe. 3. Signs of bony metastasis. 4. New pleural effusions presumably malignant and associated with pleural  nodularity.   01/20/2020 - 02/23/2020 Chemotherapy   Abraxane on days 1, 8, 15 q28 days; started 01/20/20. Stopped after 02/23/20 due to disease progression.    02/24/2020 Procedure   Left thoracentesis  IMPRESSION: Successful ultrasound guided left thoracentesis yielding 320 ml of pleural fluid.    FINAL MICROSCOPIC DIAGNOSIS:  - Malignant cells consistent with metastatic adenocarcinoma     03/02/2020 Imaging   CT Angio Chest  IMPRESSION: No evidence of pulmonary emboli.   Progression of disease particularly within the lungs, hila and mediastinum consistent with the known history of breast carcinoma with  recurrence. Persistent left breast mass is noted with increasing left axillary and left chest wall lymphadenopathy.   Bilateral pleural effusions right greater than left.   Bony metastatic disease.   03/02/2020 Imaging   CT AP  IMPRESSION: 1. No acute abdominal/pelvic findings or lymphadenopathy. No findings for small bowel obstruction. 2. 3 cm segment 6 liver lesion consistent with metastatic disease. 3. Enlarged fibroid uterus with calcified fibroids. 4. Bilateral pleural effusions and numerous small metastatic pulmonary nodules.   Aortic Atherosclerosis (ICD10-I70.0).   03/02/2020 Procedure   Right Thoracentesis  IMPRESSION: Successful ultrasound guided right thoracentesis yielding 300 cc of pleural fluid.    FINAL MICROSCOPIC DIAGNOSIS:  - Malignant cells consistent with adenocarcinoma  - See comment  The tumor cells are EQUIVOCAL for Her2 (2+);  FISH is pending and will  be reported in an addendum   Estrogen Receptor: NEGATIVE  Progesterone Receptor: NEGATIVE  Proliferation Marker Ki-67: 60%    03/19/2020 -  Chemotherapy   Xeloda 1500 mg twice daily on days 1-14 q. 21 days on 03/19/2020. C2 postponed until 04/19/20 for only 1 week. C3 start 05/03/20      CURRENT THERAPY:  Xeloda 1500 mg twice daily on days 1-14 q. 21 days on 03/19/2020. C2 postponed until 04/19/20 for only 1 week. C3 start 8/2.   INTERVAL HISTORY:  Wendy [name] is here for a follow up. She presents to the clinic with her family. She notes she is doing well.  She had PleurX draining tube place on 04/12/20. She returned to ED on 04/26/20 due to hypoglycemia. The morning before she ate adequately. She notes her current cycle started on 04/19/20. She took for 1 week (last pill on 04/25/20) as she went to ED and was not able to take.  She notes Mirtazapine has helped her appetite but her appetite still fluctuates.    REVIEW OF SYSTEMS:   Constitutional: Denies fevers, chills or abnormal weight loss Eyes: Denies  blurriness of vision Ears, nose, mouth, throat, and face: Denies mucositis or sore throat Respiratory: Denies cough, dyspnea or wheezes (+) On continuous oxygen canula  Cardiovascular: Denies palpitation, chest discomfort or lower extremity swelling Gastrointestinal:  Denies nausea, heartburn or change in bowel habits Skin: Denies abnormal skin rashes Lymphatics: Denies new lymphadenopathy or easy bruising Neurological:Denies numbness, tingling or new weaknesses Behavioral/Psych: Mood is stable, no new changes  All other systems were reviewed with the patient and are negative.  MEDICAL HISTORY:  Past Medical History:  Diagnosis Date  . Abnormal breast finding 2018   per pt/ having  a lot drainage from left breast nipple  . Anemia   . Anxiety   . Breast cancer ([place]) 11/2016   left/  . CKD (chronic kidney disease)   . Depression   . GERD (gastroesophageal reflux disease)    TUMS as needed  . HTN (hypertension)    states BP has been  high recently; has been on med. x 20 yr.  . Hyperlipidemia   . Hyperplastic colon polyp   . HYPERTENSION, BENIGN SYSTEMIC 11/29/2006   Lisinopril hctz 10-12.66m, amlodipine 122m coreg 2554mID, clonidine 0.3 mg BID.   . IMarland Kitchensulin dependent diabetes mellitus   . Personal history of chemotherapy    2018/finished 6 weeks of chemo in Sep 2018  . Personal history of radiation therapy    2018 left breast/finished radiation in Sept 2018 per pt.    SURGICAL HISTORY: Past Surgical History:  Procedure Laterality Date  . AXILLARY LYMPH NODE DISSECTION Left 08/05/2019   Procedure: LEFT AXILLARY LYMPH NODE DISSECTION;  Surgeon: CorErroll LunaD;  [facility];  Laterality: Left;  . BREAST LUMPECTOMY Left 05/05/2008  . BREAST LUMPECTOMY Left 12/07/2016   malignant  . BREAST LUMPECTOMY WITH RADIOACTIVE SEED AND SENTINEL LYMPH NODE BIOPSY Left 12/07/2016   Procedure: LEFT BREAST LUMPECTOMY WITH RADIOACTIVE SEED AND  SENTINEL LYMPH NODE BIOPSY;  Surgeon: ThoErroll LunaD;  [facility];  Laterality: Left;  . IR FLUORO GUIDE PORT INSERTION RIGHT  01/04/2017  . IR PERC PLEURAL DRAIN W/INDWELL CATH W/IMG GUIDE  04/12/2020  . IR THORACENTESIS ASP PLEURAL SPACE W/IMG GUIDE  03/02/2020  . IR US [place] VASC ACCESS RIGHT  01/04/2017  . PORT-A-CATH REMOVAL N/A 05/30/2017   Procedure: REMOVAL PORT-A-CATH;  Surgeon: CorErroll LunaD;  [place] [place];  Laterality: N/A;  . RE-EXCISION OF BREAST LUMPECTOMY Left 12/28/2016   Procedure: RE-EXCISION OF BREAST LUMPECTOMY;  Surgeon: ThoErroll LunaD;  [facility];  Laterality: Left;    I have reviewed the social history and family history with the patient and they are unchanged from previous note.  ALLERGIES:  has No Known Allergies.  MEDICATIONS:  Current Outpatient Medications  Medication Sig Dispense Refill  . albuterol (VENTOLIN HFA) 108 (90 Base) MCG/ACT inhaler Inhale 2 puffs into the lungs every 6 (six) hours as needed for wheezing or shortness of breath. 18 g 2  . amLODipine (NORVASC) 10 MG tablet TAKE 1 TABLET BY MOUTH EVERY DAY (Patient taking differently: Take 10 mg by mouth daily. ) 90 tablet 1  . B-D INS SYRINGE 0.5CC/31GX5/16 31G X 5/16" 0.5 ML MISC USE AS DIRECTED 4 TIMES A DAY 300 each 1  . benzonatate (TESSALON PERLES) 100 MG capsule Take 1 capsule (100 mg total) by mouth 3 (three) times daily as needed for cough. 30 capsule 2  . busPIRone (BUSPAR) 10 MG tablet Take 1 tablet (10 mg total) by mouth 3 (three) times daily. 270 tablet 1  . carvedilol (COREG) 25 MG tablet Take 1 tablet (25 mg total) by mouth 2 (two) times daily with a meal. 180 tablet 3  . cloNIDine (CATAPRES) 0.3 MG tablet TAKE 1 TABLET (0.3 MG TOTAL) BY MOUTH 2 (TWO) TIMES DAILY. 180 tablet 1  . docusate sodium (COLACE) 100 MG capsule Take 1 capsule (100 mg total) by mouth 2 (two) times  daily. 10 capsule 0  . famotidine (PEPCID) 20 MG tablet TAKE 1 TABLET BY MOUTH EVERY DAY (Patient taking differently: Take 20 mg by mouth daily. ) 90 tablet 1  . fluticasone (FLONASE) 50 MCG/ACT nasal spray PLACE 1 SPRAY INTO BOTH NOSTRILS DAILY. 48 mL 3  . fluticasone furoate-vilanterol (BREO ELLIPTA) 100-25 MCG/INH AEPB Inhale 1 puff into the lungs daily. 14 each 0  . furosemide (LASIX) 20 MG  tablet Take 20 mg by mouth daily.    . insulin glargine (LANTUS SOLOSTAR) 100 UNIT/ML Solostar Pen Inject 30 Units into the skin daily. Inject 32 units into the skin in am (Patient taking differently: Inject 32 Units into the skin in the morning. ) 10 pen 3  . Insulin Pen Needle 31G X 5 MM MISC Use pen needles for insulin injection daily 100 each 3  . insulin regular (NOVOLIN R) 100 units/mL injection Inject 0.02-0.03 mLs (2-3 Units total) into the skin 3 (three) times daily before meals. If BG persistently greater than 250 on QID accuchecks at home. Can use as Sliding scale insulin as well 20 mL 11  . Insulin Syringe-Needle U-100 (B-D INS SYRINGE 0.5CC/30GX1/2") 30G X 1/2" 0.5 ML MISC USE AS DIRECTED 4 TIMES A DAY 300 each 3  . lisinopril (ZESTRIL) 5 MG tablet Take 1 tablet (5 mg total) by mouth daily. 30 tablet 11  . lovastatin (MEVACOR) 40 MG tablet TAKE 1 TABLET BY MOUTH EVERYDAY AT BEDTIME (Patient taking differently: Take 40 mg by mouth at bedtime. ) 90 tablet 3  . mirtazapine (REMERON) 7.5 MG tablet Take 1 tablet (7.5 mg total) by mouth at bedtime. 30 tablet 2  . montelukast (SINGULAIR) 10 MG tablet Take 1 tablet (10 mg total) by mouth at bedtime. 30 tablet 3  . ondansetron (ZOFRAN) 8 MG tablet Take 1 tablet (8 mg total) by mouth 2 (two) times daily as needed (Nausea or vomiting). 30 tablet 1  . oxyCODONE (OXY IR/ROXICODONE) 5 MG immediate release tablet Take 1 tablet (5 mg total) by mouth every 6 (six) hours as needed for moderate pain or severe pain. Anticipate ongoing need for pain in cancer patient  with pleurx catheter- im refilling early as Im out of town next week. 20 tablet 0  . polyethylene glycol powder (GLYCOLAX/MIRALAX) 17 GM/SCOOP powder Take 17 g by mouth daily. (Patient taking differently: Take 17 g by mouth as needed for mild constipation or moderate constipation. ) 1 g 0  . prochlorperazine (COMPAZINE) 10 MG tablet Take 1 tablet (10 mg total) by mouth every 6 (six) hours as needed for nausea or vomiting. 30 tablet 1  . traMADol (ULTRAM) 50 MG tablet Take 50 mg by mouth every 6 (six) hours as needed for moderate pain.    . XELODA 500 MG tablet Take 3 tablets (1,500 mg total) by mouth 2 (two) times daily after a meal. Take for 14 days, then hold for 7 days. Repeat every 21 days. 84 tablet 1   No current facility-administered medications for this visit.    PHYSICAL EXAMINATION: ECOG PERFORMANCE STATUS: 3 - Symptomatic, >50% confined to bed  Vitals:   04/29/20 1412  BP: (!) 110/48  Pulse: 76  Resp: 18  Temp: 97.7 F (36.5 C)  SpO2: 100%   Filed Weights   04/29/20 1412  Weight: 197 lb 6.4 oz (89.5 kg)    GENERAL:alert, no distress and comfortable SKIN: skin color, texture, turgor are normal, no rashes or significant lesions EYES: normal, Conjunctiva are pink and non-injected, sclera clear  NECK: supple, thyroid normal size, non-tender, without nodularity LYMPH:  no palpable lymphadenopathy in the cervical, axillary  LUNGS: clear to auscultation and percussion with normal breathing effort (+) On Continuous canula oxygen (+) right PleurX placed, site clean and dry.  HEART: regular rate & rhythm and no murmurs and no lower extremity edema ABDOMEN:abdomen soft, non-tender and normal bowel sounds Musculoskeletal:no cyanosis of digits and no clubbing  NEURO:  alert & oriented x 3 with fluent speech, no focal motor/sensory deficits  LABORATORY DATA:  I have reviewed the data as listed CBC Latest Ref Rng & Units 04/29/2020 04/26/2020 04/16/2020  WBC 4.0 - 10.5 K/uL 7.3  13.3(H) 5.6  Hemoglobin 12.0 - 15.0 g/dL 7.7(L) 9.3(L) 8.5 Repeated and verified X2.(L)  Hematocrit 36 - 46 % 23.2(L) 28.3(L) 24.3(L)  Platelets 150 - 400 K/uL 272 236 204.0     CMP Latest Ref Rng & Units 04/29/2020 04/26/2020 04/16/2020  Glucose 70 - 99 mg/dL 108(H) 159(H) 178(H)  BUN 8 - 23 mg/dL 13 23 15   Creatinine 0.44 - 1.00 mg/dL 1.17(H) 1.65(H) 1.27(H)  Sodium 135 - 145 mmol/L 138 135 136  Potassium 3.5 - 5.1 mmol/L 4.5 4.4 3.9  Chloride 98 - 111 mmol/L 98 92(L) 97  CO2 22 - 32 mmol/L 31 30 36(H)  Calcium 8.9 - 10.3 mg/dL 9.4 8.9 8.2(L)  Total Protein 6.5 - 8.1 g/dL 6.0(L) 7.1 5.4(L)  Total Bilirubin 0.3 - 1.2 mg/dL 0.3 0.4 0.7  Alkaline Phos 38 - 126 U/L 130(H) 115 141(H)  AST 15 - 41 U/L 18 16 10   ALT 0 - 44 U/L <6 8 5       RADIOGRAPHIC STUDIES: I have personally reviewed the radiological images as listed and agreed with the findings in the report. No results found.   ASSESSMENT & PLAN:  CALAIS SVEHLA is a 79 y.o. female with    1. Malignant neoplasm of upper-outer quadrant of left breast, Invasive Ductal Carcinoma, pT2pN1M0, stage IIB, ER+/PR-/HER2-, G3, mammaprint high risk, axillarynodesrecurrence in 06/2019, lung nodules in 10/2019, metastatic to lung, bone, LNs, liver in 01/2020 -She was initiallydiagnosed in 11/2016. She was treated with left lumpectomy and SLN sampling, re-excision surgery, Adjuvant TC for 4 cycles andadjuvantradiation. She was on adjuvant Letrozole from 06/2017-08/14/19 -Unfortunately she had local recurrence in left axillaandunderwent Left axillary lymph node dissection by Dr. Brantley Stage on 08/05/19.She had complete resection. -She continued Exemestane and addedoral biological agent Verzenio 163m BID starting3/4/21. -HerPETimagesfrom 4/7/21showedwide spread disease with soft tissue recurrence in the left axilla, enlarging LNs in chest, new lung pleural and parenchymal disease, metastatic disease of the right liver lobe and signs of bone  metastasis. There is also new pleural effusions presumably malignant and associated with pleural nodularity.Based on the scan findings,I do not think biopsy is necessary. At this stage her cancer is no longer curable but still treatable.  -Due to the rapid disease progression through AI and CDK4/6 inhibitor,and multiple visceral organ involvement, Istarted her on systemic chemo withsingle agent Abraxane828mm2 weekly,3 weeks on/1 week offon 01/20/20. It was stopped due to disease progression of malignant pleural effusion in 01/2020.  -HerFO results showedRET, CDK8, NOTCH1, TP53 mutations.Also PDL-1 negative. Notargetable mutation or immunotherapy. -We previously discussed chemotherapy, vs palliative and hospice care.  After she discussed with her family, especially with her son ChGerald Stabsrom Te[place] Hampsh[place]he has decided to try chemo again. -I started her on oral chemo with Xeloda 1500 mg twice daily on days 1-14 q. 21 days on 03/19/2020. C2 postponed due to hospitalization started 04/19/20 but only had for 1 week due to hypoglycemia episode.  -Labs reviewed, Hg 7.7, BG 108, Cr 1.17, protein 6, albumin 2.4, Alk Phos 130. Overall adequate to continue treatment. Plan to start C3 on 05/03/20 at same dose.  -f/u in 3 weeks  -will repeat staging scan after cycle 4 or 5    2.Dyspneaon exertion,Asthma,lung metastasis,bilateral malignant pleural effusion -She has been having Dry  Cough and SOB for the past few months and has progressed recently. -Patient denies smoking before but notes she has h/o of Tobacco snuffand h/o asthma. -She has receivedboth herCOVID19 vaccinations. -She is being followed by PCP andpulmonologist Dr. Stephan Minister breathing approved on [place] given by her PCP. -Her recent VQ scan was negative for PE -In May and June 2021 she has had b/l thoracentesis. Bothprocedure had onlyabout 300 cc fluidsremovedand both cytologies were positive forMalignant cells consistent with  adenocarcinoma. -She does note h/o of tobacco use with dip and snuff, but no smoking. -Because her pleural effusion is related to her cancer this can become recurrent. Her last Thoracentesis was 03/18/20. With hospitalization from SOB she had PleurX tube placed on 04/12/20.  -Since 03/2020 hospitalization she has been on continuous canula. I discussed watching her Pulse Ox at home and try to reduce use of Oxygen   3.Low appetite, nausea, B/l abdominal pain, Diarrhea/constipation -She notes she has nausea which is controlled with antiemetics -She has lowered appetite from her bloating, early satiety, along with occasional b/l abdominal pain and occasional diarrhea/constipation.  -She has metamucil and prune juice as needed which helps her bowels.  -I encouraged her to use her Glucerna to help her maintain weight.I started her onMirtazapine 7.24m for her to take nightly (01/08/20). If not enough we can titrate up or I can call in Marinol. I recommend she hold Trazodone for now given Mirtazapine can cause drowsiness. -For her pain I started her on Tramadol (03/08/20). If her pain progresses or not controlling her pain, I will call in Oxycodone.   4. Left arm numbness/tinglingandLeft arm lymphedema, secondary to surgery. -secondary to malignant left axillary adenopathy, s/p ALND in 08/2019 -She still has left armand handlymphedema. I discussed her edema can improve but may have residual edema permanently. -She is currently doing PT 3 times a week, continue.   5. HTN, DM, CKD III -F/u with PCPand endocrinologist.  -DM is not well controlled. I encouraged her to better control her DM as this can effects her kidney function.  -She did have an episode of hypoglycemia on 04/26/20. I recommend she f/u with endocrinologist   6. Mild anemia -Iron and TIBC, B12, MMA normal -Anemia got worse since she started chemotherapy, and required blood transfusion, last on 02/12/20 -Hg has dropped  again at 7.7 today (04/29/20). Will given blood transfusion this week.   7. Osteopenia  -09/2018 DEXA shows osteopeniawith lowest T-score -1.1 -Will monitor onAI. Next DEXA in 09/2020  8. Financialand SocialSupport  -She gets help with her son but he is looking to return to working. I [facility]. She is open.  -Per request, I will see if she is eligible for home aid.   9. Goal of care discussion, DNR/DNI -Due to her rapid disease progression, her cancer is not curable at this point. The goal is palliative, to prolong her life and improve quality of life. She understands and agrees. -The patient understands the goal of care is palliative. -I recommend DNR/DNI, she agreed to DNR/DNI.  -Given her aggressive cancer and decreased performance status, she will think about hospice care.   Plan: -Continue Xeloda 15033mBID 2 weeks on/1 week off, start C3 on 8/2  -Blood transfusion this week  -Lab and f/u in 3 weeks  -Per request I provided her son (her caregiver) with letter for stating he is busy with her care and unable to work at this time.    No  problem-specific Assessment & Plan notes found for this encounter.   Orders Placed This Encounter  Procedures  . Care order/instruction    Transfuse Parameters    Standing Status:   Future    Standing Expiration Date:   04/29/2021  . Type and screen    Standing Status:   Future    Number of Occurrences:   1    Standing Expiration Date:   04/29/2021    Scheduling Instructions:     Transfuse 05/01/2020   All questions were answered. The patient knows to call the clinic with any problems, questions or concerns. No barriers to learning was detected. The total time spent in the appointment was 30 minutes.     [name], MD 04/29/2020   I, Joslyn Devon, am acting as scribe for [name], MD.   I have reviewed the above documentation for accuracy and completeness, and I agree with  the above.

## 2020-04-26 ENCOUNTER — Emergency Department (HOSPITAL_COMMUNITY): Payer: Medicare Other

## 2020-04-26 ENCOUNTER — Emergency Department (HOSPITAL_COMMUNITY)
Admission: EM | Admit: 2020-04-26 | Discharge: 2020-04-26 | Disposition: A | Discharge status: Home / Self Care | Payer: Medicare Other | Attending: Emergency Medicine | Admitting: Emergency Medicine

## 2020-04-26 DIAGNOSIS — E162 Hypoglycemia, unspecified: Secondary | ICD-10-CM

## 2020-04-26 DIAGNOSIS — I517 Cardiomegaly: Secondary | ICD-10-CM | POA: Diagnosis not present

## 2020-04-26 DIAGNOSIS — C50912 Malignant neoplasm of unspecified site of left female breast: Secondary | ICD-10-CM | POA: Insufficient documentation

## 2020-04-26 DIAGNOSIS — Z794 Long term (current) use of insulin: Secondary | ICD-10-CM | POA: Insufficient documentation

## 2020-04-26 DIAGNOSIS — Z79899 Other long term (current) drug therapy: Secondary | ICD-10-CM | POA: Insufficient documentation

## 2020-04-26 DIAGNOSIS — I10 Essential (primary) hypertension: Secondary | ICD-10-CM | POA: Diagnosis not present

## 2020-04-26 DIAGNOSIS — I129 Hypertensive chronic kidney disease with stage 1 through stage 4 chronic kidney disease, or unspecified chronic kidney disease: Secondary | ICD-10-CM | POA: Diagnosis not present

## 2020-04-26 DIAGNOSIS — R4182 Altered mental status, unspecified: Secondary | ICD-10-CM | POA: Diagnosis present

## 2020-04-26 DIAGNOSIS — R404 Transient alteration of awareness: Secondary | ICD-10-CM | POA: Diagnosis not present

## 2020-04-26 DIAGNOSIS — C50919 Malignant neoplasm of unspecified site of unspecified female breast: Secondary | ICD-10-CM | POA: Diagnosis not present

## 2020-04-26 DIAGNOSIS — E11649 Type 2 diabetes mellitus with hypoglycemia without coma: Secondary | ICD-10-CM | POA: Insufficient documentation

## 2020-04-26 DIAGNOSIS — I959 Hypotension, unspecified: Secondary | ICD-10-CM | POA: Diagnosis not present

## 2020-04-26 DIAGNOSIS — R231 Pallor: Secondary | ICD-10-CM | POA: Diagnosis not present

## 2020-04-26 DIAGNOSIS — E161 Other hypoglycemia: Secondary | ICD-10-CM | POA: Diagnosis not present

## 2020-04-26 DIAGNOSIS — J90 Pleural effusion, not elsewhere classified: Secondary | ICD-10-CM | POA: Diagnosis not present

## 2020-04-26 DIAGNOSIS — M2550 Pain in unspecified joint: Secondary | ICD-10-CM | POA: Diagnosis not present

## 2020-04-26 DIAGNOSIS — R001 Bradycardia, unspecified: Secondary | ICD-10-CM | POA: Diagnosis not present

## 2020-04-26 DIAGNOSIS — Z7401 Bed confinement status: Secondary | ICD-10-CM | POA: Diagnosis not present

## 2020-04-26 DIAGNOSIS — N183 Chronic kidney disease, stage 3 unspecified: Secondary | ICD-10-CM | POA: Insufficient documentation

## 2020-04-26 LAB — URINALYSIS, ROUTINE W REFLEX MICROSCOPIC
Bilirubin Urine: NEGATIVE
Glucose, UA: NEGATIVE mg/dL
Hgb urine dipstick: NEGATIVE
Ketones, ur: NEGATIVE mg/dL
Leukocytes,Ua: NEGATIVE
Nitrite: NEGATIVE
Protein, ur: 100 mg/dL — AB
Specific Gravity, Urine: 1.011 (ref 1.005–1.030)
pH: 5 (ref 5.0–8.0)

## 2020-04-26 LAB — CBC
HCT: 28.3 % — ABNORMAL LOW (ref 36.0–46.0)
Hemoglobin: 9.3 g/dL — ABNORMAL LOW (ref 12.0–15.0)
MCH: 30 pg (ref 26.0–34.0)
MCHC: 32.9 g/dL (ref 30.0–36.0)
MCV: 91.3 fL (ref 80.0–100.0)
Platelets: 236 10*3/uL (ref 150–400)
RBC: 3.1 MIL/uL — ABNORMAL LOW (ref 3.87–5.11)
RDW: 18.6 % — ABNORMAL HIGH (ref 11.5–15.5)
WBC: 13.3 10*3/uL — ABNORMAL HIGH (ref 4.0–10.5)
nRBC: 0 % (ref 0.0–0.2)

## 2020-04-26 LAB — CBG MONITORING, ED
Glucose-Capillary: 122 mg/dL — ABNORMAL HIGH (ref 70–99)
Glucose-Capillary: 194 mg/dL — ABNORMAL HIGH (ref 70–99)
Glucose-Capillary: 201 mg/dL — ABNORMAL HIGH (ref 70–99)
Glucose-Capillary: 132 mg/dL — ABNORMAL HIGH (ref 70–99)

## 2020-04-26 LAB — COMPREHENSIVE METABOLIC PANEL
ALT: 8 U/L (ref 0–44)
AST: 16 U/L (ref 15–41)
Albumin: 2.6 g/dL — ABNORMAL LOW (ref 3.5–5.0)
Alkaline Phosphatase: 115 U/L (ref 38–126)
Anion gap: 13 (ref 5–15)
BUN: 23 mg/dL (ref 8–23)
CO2: 30 mmol/L (ref 22–32)
Calcium: 8.9 mg/dL (ref 8.9–10.3)
Chloride: 92 mmol/L — ABNORMAL LOW (ref 98–111)
Creatinine, Ser: 1.65 mg/dL — ABNORMAL HIGH (ref 0.44–1.00)
GFR calc Af Amer: 34 mL/min — ABNORMAL LOW (ref >60)
GFR calc non Af Amer: 29 mL/min — ABNORMAL LOW (ref >60)
Glucose, Bld: 159 mg/dL — ABNORMAL HIGH (ref 70–99)
Potassium: 4.4 mmol/L (ref 3.5–5.1)
Sodium: 135 mmol/L (ref 135–145)
Total Bilirubin: 0.4 mg/dL (ref 0.3–1.2)
Total Protein: 7.1 g/dL (ref 6.5–8.1)

## 2020-04-26 LAB — T4, FREE: Free T4: 0.96 ng/dL (ref 0.61–1.12)

## 2020-04-26 LAB — TSH: TSH: 2.874 u[IU]/mL (ref 0.350–4.500)

## 2020-04-26 MED ORDER — SODIUM CHLORIDE 0.9% FLUSH: 3.0000 mL | Freq: Two times a day (BID) | INTRAVENOUS | Status: DC

## 2020-04-26 MED ORDER — SODIUM CHLORIDE 0.9% FLUSH: 3.0000 mL | INTRAVENOUS | Status: DC | PRN

## 2020-04-26 MED ORDER — SODIUM CHLORIDE 0.9 % IV SOLN: 250.0000 mL | INTRAVENOUS | Status: DC | PRN

## 2020-04-26 MED ORDER — LACTATED RINGERS IV BOLUS
1000.0000 mL | Freq: Once | INTRAVENOUS | Status: AC
  Administered 2020-04-26: 1000 mL via INTRAVENOUS

## 2020-04-26 NOTE — ED Provider Notes (Signed)
[facility] DEPT Provider Note   CSN: [id number] Arrival date & time: 04/26/20  0935     History Chief Complaint  Patient presents with  . Hypoglycemia    Wendy [name] is a 79 y.o. female.  With a PMHx of left sided breast cancer, HTN, HLD, and DM BIB EMS for an episode of altered mental status that occurred this morning. The patients son is at bedside and states he noticed his mother moaning this morning at 0600 and when he went in to check on her she was staring into the distance and not responding well. He also reports she was sweating. He then called 911. When they arrived, her glucose levels were checked and initially was 43. She was given orange juice and this raised her glucose to 53. They then gave her 1 mg of glucagon and that increased her glucose to 84. EMS note patient was alert and oriented only to person, not time and place. When she arrived at the ED, she was unable to tell me why she was in the emergency department or what occurred prior to EMS arriving. She was alert to person, place, and time, but was unsure as to why she was at the emergency department.   Her son than preceded to tell me that yesterday evening his mothers glucose level was 212 after dinner at 2200. He gave her 12 units of novilin and 28 units of lantus. She had not taken any of her medications this morning. He mentions that due to the chemotherapy medications, the patient has had decrease in appetite and it has been difficult to control the patients blood sugars.   Denies falls, recent illnesses, fever, chest pain, shortness of breath, cough, abdominal pain, nausea, vomiting, diarrhea. The patient has no complaints at this time.   The history is provided by the patient, a relative and the EMS personnel.  Altered Mental Status Presenting symptoms: confusion and disorientation   Presenting symptoms: no combativeness and no memory loss   Severity:  Moderate Most recent episode:   Today Episode history:  Single Timing:  Constant Progression:  Improving Context: taking medications as prescribed, not recent change in medication, not recent illness and not recent infection   Associated symptoms: no abdominal pain, no difficulty breathing, no fever, no headaches, no nausea and no vomiting           Past Medical History:  Diagnosis Date  . Abnormal breast finding 2018   per pt/ having  a lot drainage from left breast nipple  . Anemia   . Anxiety   . Breast cancer ([facility]) 11/2016   left/  . CKD (chronic kidney disease)   . Depression   . GERD (gastroesophageal reflux disease)    TUMS as needed  . HTN (hypertension)    states BP has been high recently; has been on med. x 20 yr.  . Hyperlipidemia   . Hyperplastic colon polyp   . HYPERTENSION, BENIGN SYSTEMIC 11/29/2006   Lisinopril hctz 10-12.5mg , amlodipine 10mg , coreg 25mg  BID, clonidine 0.3 mg BID.   Marland Kitchen Insulin dependent diabetes mellitus   . Personal history of chemotherapy    2018/finished 6 weeks of chemo in Sep 2018  . Personal history of radiation therapy    2018 left breast/finished radiation in Sept 2018 per pt.    Patient Active Problem List   Diagnosis Date Noted  . DNR (do not resuscitate) 03/24/2020  . 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 Palliative care  by specialist   . 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 Metastatic breast cancer ([place]) 03/02/2020  . Advanced care planning/counseling discussion   . Moderate persistent asthma 01/31/2020  . Allergic rhinitis 01/31/2020  . GERD (gastroesophageal reflux disease) 01/31/2020  . Goals of care, counseling/discussion 01/08/2020  . Lymphedema of left arm 09/03/2019  . 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 Grief 05/10/2018  . Anemia 01/09/2018  . Fatigue 03/14/2017  . Edema 03/14/2017  . Shortness of breath 03/14/2017  . Port catheter in place 01/18/2017  . Rectal bleeding 01/17/2017  . Constipation  01/17/2017  . Malignant neoplasm of upper-outer quadrant of left breast in female, estrogen receptor positive ([place]) 11/20/2016  . CKD (chronic kidney disease) stage 3, GFR 30-59 ml/min ([facility]) 03/17/2016  . 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 TOBACCO USE 04/13/2008  . Hyperlipidemia associated with type 2 diabetes mellitus ([place]) 11/29/2006  . Morbid obesity ([place]) 11/29/2006  . Essential hypertension 11/29/2006    Past Surgical History:  Procedure Laterality Date  . AXILLARY LYMPH NODE DISSECTION Left 08/05/2019   Procedure: LEFT AXILLARY LYMPH NODE DISSECTION;  Surgeon: Erroll Luna, MD;  Location: [facility];  Service: General;  Laterality: Left;  . BREAST LUMPECTOMY Left 05/05/2008  . BREAST LUMPECTOMY Left 12/07/2016   malignant  . BREAST LUMPECTOMY WITH RADIOACTIVE SEED AND SENTINEL LYMPH NODE BIOPSY Left 12/07/2016   Procedure: LEFT BREAST LUMPECTOMY WITH RADIOACTIVE SEED AND SENTINEL LYMPH NODE BIOPSY;  Surgeon: Erroll Luna, MD;  Location: [facility];  Service: General;  Laterality: Left;  . IR FLUORO GUIDE PORT INSERTION RIGHT  01/04/2017  . IR PERC PLEURAL DRAIN W/INDWELL CATH W/IMG GUIDE  04/12/2020  . IR THORACENTESIS ASP PLEURAL SPACE W/IMG GUIDE  03/02/2020  . IR US GUIDE VASC ACCESS RIGHT  01/04/2017  . PORT-A-CATH REMOVAL N/A 05/30/2017   Procedure: REMOVAL PORT-A-CATH;  Surgeon: Erroll Luna, MD;  Location: [facility];  Service: General;  Laterality: N/A;  . RE-EXCISION OF BREAST LUMPECTOMY Left 12/28/2016   Procedure: RE-EXCISION OF BREAST LUMPECTOMY;  Surgeon: Erroll Luna, MD;  Location: [facility];  Service: General;  Laterality: Left;     OB History   No obstetric history on file.     Family History  Problem Relation Age of Onset  . Diabetes Sister        x 4  . Heart failure Sister   . Diabetes Brother    . Heart disease Brother   . Diabetes Paternal Grandmother   . Diabetes Paternal Aunt   . Heart attack Brother   . Arthritis Sister   . Hypertension Sister   . Diabetes Sister   . Emphysema Brother   . Cancer Father 63       brain tumor   . Diabetes Child   . Asthma Child   . Hypertension Child   . Diabetes Child   . Diabetes Child   . Colon cancer Neg Hx   . Stomach cancer Neg Hx     Social History   Tobacco Use  . Smoking status: Never Smoker  . Smokeless tobacco: Former Systems developer    Types: Snuff  Vaping Use  . Vaping Use: Never used  Substance Use Topics  . Alcohol use: No    Alcohol/week: 0.0 standard drinks  . Drug use: No    Home Medications Prior to Admission medications  Medication Sig Start Date End Date Taking? Authorizing Provider  albuterol (VENTOLIN HFA) 108 (90 Base) MCG/ACT inhaler Inhale 2 puffs into the lungs every 6 (six) hours as needed for wheezing or shortness of breath. 10/21/19  Yes Marin Olp, MD  amLODipine (NORVASC) 10 MG tablet TAKE 1 TABLET BY MOUTH EVERY DAY Patient taking differently: Take 10 mg by mouth daily.  04/06/20  Yes Marin Olp, MD  benzonatate (TESSALON PERLES) 100 MG capsule Take 1 capsule (100 mg total) by mouth 3 (three) times daily as needed for cough. 04/16/20 04/16/21 Yes Marin Olp, MD  busPIRone (BUSPAR) 10 MG tablet Take 1 tablet (10 mg total) by mouth 3 (three) times daily. 10/16/19  Yes Marin Olp, MD  carvedilol (COREG) 25 MG tablet Take 1 tablet (25 mg total) by mouth 2 (two) times daily with a meal. 10/07/19  Yes Marin Olp, MD  cloNIDine (CATAPRES) 0.3 MG tablet TAKE 1 TABLET (0.3 MG TOTAL) BY MOUTH 2 (TWO) TIMES DAILY. 02/16/20  Yes Marin Olp, MD  docusate sodium (COLACE) 100 MG capsule Take 1 capsule (100 mg total) by mouth 2 (two) times daily. 04/13/20  Yes Guilford Shi, MD  famotidine (PEPCID) 20 MG tablet TAKE 1 TABLET BY MOUTH EVERY DAY Patient taking differently: Take 20 mg by  mouth daily.  03/09/20  Yes Mannam, Praveen, MD  fluticasone (FLONASE) 50 MCG/ACT nasal spray PLACE 1 SPRAY INTO BOTH NOSTRILS DAILY. 04/22/20  Yes Mannam, Praveen, MD  fluticasone furoate-vilanterol (BREO ELLIPTA) 100-25 MCG/INH AEPB Inhale 1 puff into the lungs daily. 03/22/20  Yes Mannam, Praveen, MD  furosemide (LASIX) 20 MG tablet Take 20 mg by mouth daily.   Yes [provider]  insulin glargine (LANTUS SOLOSTAR) 100 UNIT/ML Solostar Pen Inject 30 Units into the skin daily. Inject 32 units into the skin in am Patient taking differently: Inject 32 Units into the skin in the morning.  04/13/20  Yes Guilford Shi, MD  insulin regular (NOVOLIN R) 100 units/mL injection Inject 0.02-0.03 mLs (2-3 Units total) into the skin 3 (three) times daily before meals. If BG persistently greater than 250 on QID accuchecks at home. Can use as Sliding scale insulin as well 04/13/20  Yes Kamineni, Lamount Cranker, MD  lisinopril (ZESTRIL) 5 MG tablet Take 1 tablet (5 mg total) by mouth daily. 04/13/20 04/13/21 Yes Guilford Shi, MD  lovastatin (MEVACOR) 40 MG tablet TAKE 1 TABLET BY MOUTH EVERYDAY AT BEDTIME Patient taking differently: Take 40 mg by mouth at bedtime.  09/22/19  Yes Marin Olp, MD  mirtazapine (REMERON) 7.5 MG tablet Take 1 tablet (7.5 mg total) by mouth at bedtime. 04/23/20  Yes [name], MD  ondansetron (ZOFRAN) 8 MG tablet Take 1 tablet (8 mg total) by mouth 2 (two) times daily as needed (Nausea or vomiting). 01/08/20  Yes [name], MD  oxyCODONE (OXY IR/ROXICODONE) 5 MG immediate release tablet Take 1 tablet (5 mg total) by mouth every 6 (six) hours as needed for moderate pain or severe pain. Anticipate ongoing need for pain in cancer patient with pleurx catheter- im refilling early as Im out of town next week. 04/16/20  Yes Marin Olp, MD  polyethylene glycol powder (GLYCOLAX/MIRALAX) 17 GM/SCOOP powder Take 17 g by mouth daily. Patient taking differently: Take 17 g by mouth as  needed for mild constipation or moderate constipation.  05/22/19  Yes Pyrtle, Lajuan Lines, MD  prochlorperazine (COMPAZINE) 10 MG tablet Take 1 tablet (10 mg total) by mouth  every 6 (six) hours as needed for nausea or vomiting. 04/07/20  Yes Alla Feeling, NP  traMADol (ULTRAM) 50 MG tablet Take 50 mg by mouth every 6 (six) hours as needed for moderate pain.   Yes [provider]  XELODA 500 MG tablet Take 3 tablets (1,500 mg total) by mouth 2 (two) times daily after a meal. Take for 14 days, then hold for 7 days. Repeat every 21 days. 04/07/20  Yes [name], MD  B-D INS SYRINGE 0.5CC/31GX5/16 31G X 5/16" 0.5 ML MISC USE AS DIRECTED 4 TIMES A DAY 08/18/16   Philemon Kingdom, MD  Insulin Pen Needle 31G X 5 MM MISC Use pen needles for insulin injection daily 12/12/16   Philemon Kingdom, MD  Insulin Syringe-Needle U-100 (B-D INS SYRINGE 0.5CC/30GX1/2") 30G X 1/2" 0.5 ML MISC USE AS DIRECTED 4 TIMES A DAY 08/15/16   Philemon Kingdom, MD  montelukast (SINGULAIR) 10 MG tablet Take 1 tablet (10 mg total) by mouth at bedtime. Patient not taking: Reported on 04/21/2020 03/25/20   Annita Brod, MD    Allergies    Patient has no known allergies.  Review of Systems   Review of Systems  Constitutional: Positive for chills and diaphoresis. Negative for fatigue and fever.  Respiratory: Negative for cough and shortness of breath.   Cardiovascular: Negative for chest pain.  Gastrointestinal: Negative for abdominal pain, constipation, diarrhea, nausea and vomiting.  Genitourinary: Negative for difficulty urinating.  Musculoskeletal: Negative for back pain and neck pain.  Neurological: Negative for headaches.  Psychiatric/Behavioral: Positive for confusion. Negative for memory loss.  All other systems reviewed and are negative.   Physical Exam Updated Vital Signs BP (!) 122/53   Pulse 67   Temp (!) 96.8 F (36 C) (Rectal)   Resp (!) 10   LMP  (LMP Unknown)   SpO2 100%   Physical Exam Vitals  and nursing note reviewed.  Constitutional:      Appearance: Normal appearance.  HENT:     Head: Normocephalic and atraumatic.     Mouth/Throat:     Mouth: Mucous membranes are moist.  Eyes:     Pupils: Pupils are equal, round, and reactive to light.  Cardiovascular:     Rate and Rhythm: Normal rate and regular rhythm.     Pulses: Normal pulses.     Heart sounds: Normal heart sounds. No murmur heard.   Pulmonary:     Effort: No respiratory distress.     Breath sounds: Normal breath sounds. No stridor. No wheezing, rhonchi or rales.  Abdominal:     General: Abdomen is flat. There is no distension.     Palpations: Abdomen is soft.     Tenderness: There is no abdominal tenderness.  Musculoskeletal:        General: No tenderness.     Cervical back: Normal range of motion and neck supple.     Right lower leg: No edema.     Left lower leg: No edema.  Skin:    General: Skin is warm and dry.  Neurological:     General: No focal deficit present.     Mental Status: She is alert.     Comments: Initially aox3 without understanding situation when she arrived.   Psychiatric:        Mood and Affect: Mood normal.        Behavior: Behavior normal.     ED Results / Procedures / Treatments   Labs (all labs ordered are listed,  but only abnormal results are displayed) Labs Reviewed  CBC - Abnormal; Notable for the following components:      Result Value   WBC 13.3 (*)    RBC 3.10 (*)    Hemoglobin 9.3 (*)    HCT 28.3 (*)    RDW 18.6 (*)    All other components within normal limits  URINALYSIS, ROUTINE W REFLEX MICROSCOPIC - Abnormal; Notable for the following components:   Protein, ur 100 (*)    Bacteria, UA RARE (*)    All other components within normal limits  COMPREHENSIVE METABOLIC PANEL - Abnormal; Notable for the following components:   Chloride 92 (*)    Glucose, Bld 159 (*)    Creatinine, Ser 1.65 (*)    Albumin 2.6 (*)    GFR calc non Af Amer 29 (*)    GFR calc Af Amer  34 (*)    All other components within normal limits  CBG MONITORING, ED - Abnormal; Notable for the following components:   Glucose-Capillary 122 (*)    All other components within normal limits  CBG MONITORING, ED - Abnormal; Notable for the following components:   Glucose-Capillary 194 (*)    All other components within normal limits  CBG MONITORING, ED - Abnormal; Notable for the following components:   Glucose-Capillary 201 (*)    All other components within normal limits  CBG MONITORING, ED - Abnormal; Notable for the following components:   Glucose-Capillary 132 (*)    All other components within normal limits  TSH  T4, FREE  CBG MONITORING, ED    EKG EKG Interpretation  Date/Time:  Monday April 26 2020 10:06:43 EDT Ventricular Rate:  72 PR Interval:    QRS Duration: 103 QT Interval:  385 QTC Calculation: 422 R Axis:   49 Text Interpretation: Sinus rhythm new Paired ventricular premature complexes Low voltage, precordial leads Confirmed by Blanchie Dessert ([id number]) on 04/26/2020 10:54:53 AM   Radiology DG Chest Port 1 View  Result Date: 04/26/2020 CLINICAL DATA:  Hypoglycemia. History of metastatic breast cancer status post PleurX catheter placement EXAM: PORTABLE CHEST 1 VIEW COMPARISON:  04/11/2020 FINDINGS: Interval placement of right-sided pleural drainage catheter with interval decrease in size of a right-sided pleural effusion. Stable mild cardiomegaly. Small left pleural effusion persists. Hazy and nodular opacities throughout both lungs. No pneumothorax. IMPRESSION: 1. Interval placement of right-sided pleural drainage catheter with interval decrease in size of a right-sided pleural effusion. No pneumothorax. 2. Persistent hazy and nodular opacities throughout both lungs compatible with known pulmonary metastatic disease. A superimposed infectious process would be difficult to exclude. 3. Small left pleural effusion persists. Electronically Signed   By: Davina Poke  D.O.   On: 04/26/2020 11:28    Procedures Procedures (including critical care time)  Medications Ordered in ED Medications  sodium chloride flush (NS) 0.9 % injection 3 mL (3 mLs Intravenous Not Given 04/26/20 1047)  sodium chloride flush (NS) 0.9 % injection 3 mL (has no administration in time range)  0.9 %  sodium chloride infusion (has no administration in time range)  lactated ringers bolus 1,000 mL (1,000 mLs Intravenous New Bag/Given 04/26/20 1301)    ED Course  I have reviewed the triage vital signs and the nursing notes.  Pertinent labs & imaging results that were available during my care of the patient were reviewed by me and considered in my medical decision making (see chart for details).  4:54 PM Glucose of 122  4:54 PM Glucose of 197,  patient's mental status reassessed she is alert and oriented to person place and time and able to tell me why she is here today.   [place] applied, patient's rectal temperature of 95.7 F, will check TSH/Free T4    4:54 PM Patient's temperature has improved to 96 F. Repeat glucose of 132.   MDM Rules/Calculators/A&P                          Bradyn Vassey is a 79 y/o F who presented to the ED this morning BIB EMS for altered mental status and blood glucose of 43, that has resolved during her ED visit. She denies any recent illness, fever,chills, cough, shortness of breath, n/v/d, abdominal pain, back pain, chest pain.   Lab data revealed WBC of 13.3, hemoglobin of 9.3, which is consistent with patient's hemoglobins in the past. Creatinine of 1.65. LR bolus given. TSH of 2.874. Urinalysis positive for protein and rare bacteria. No leukocytes or nitrite. Do not suspect UTI.   Chest X-ray consistent with pulmonary metastatic disease. Patient has no clinical symptoms to indicate any infection. Patient denies fever, cough, shortness of breath, or needing to increase her home oxygen levels recently. Cause of leukocytosis, have low  suspicion for leukocytosis. Suspect reactive leukocytosis from hypoglycemia or due to patient recently starting her chemotherapy medication.   Blood glucoses trended up during patient's stay.   Suspect patient's low rectal temperatures are also from her episode of hypoglycemia.   Do not believe patient warrants admission at this time. Will discharge home to the care of her son and recommend follow up with primary care physician.   Final Clinical Impression(s) / ED Diagnoses Final diagnoses:  Hypoglycemia    Rx / DC Orders ED Discharge Orders    None       Riesa Pope, MD 04/26/20 1654    Blanchie Dessert, MD 04/27/20 1533

## 2020-04-26 NOTE — ED Notes (Signed)
Patient's son brought in empty oxygen tank.  Stated he doe snot have another one.  Offered to have the patient transported home via [facility], son agreeable.  [facility] contacted for transport.  Discussed discharge instructions and medications with patient and her son at bedside. IV removed. All questions addressed. Patient son Marta Antu took discharge paperwork home with him.

## 2020-04-26 NOTE — ED Notes (Signed)
Patient's son is here to take her home however he does not have an oxygen tank for patient who is on chronic 4L.  He asked this nurse if I thought she would be ok until they got home, they leave 20 mins from here.  I advised him against taking patient with no oxygen.  Therefore, son has left to get home oxygen.

## 2020-04-26 NOTE — Discharge Instructions (Addendum)
Thank you for allowing [name] to assist in your care today. Please follow up with your primary care physician and your oncologist to discuss managing patient's diabetes medication along with decrease in appetite from chemotherapy medication.   If any new symptoms arise or current symptoms worsen, please return to your closest emergency room.

## 2020-04-26 NOTE — ED Notes (Signed)
Bear hugger applied to patient for a rectal temp 95.7.

## 2020-04-26 NOTE — ED Triage Notes (Signed)
Transported by [facility] from home-- presents with hypoglycemia; initial blood glucose of 43 mg/dl upon EMS arrival. Fire and EMS administered oral glucose, orange juice and 1 mg of glucagon IM PTA and CBG obtained upon arrival to ED was 84 mg/dl. AAO x 4. Patient's only complaint is that she has been "sick" the last few weeks.

## 2020-04-28 DIAGNOSIS — C782 Secondary malignant neoplasm of pleura: Secondary | ICD-10-CM | POA: Diagnosis not present

## 2020-04-28 DIAGNOSIS — C50912 Malignant neoplasm of unspecified site of left female breast: Secondary | ICD-10-CM | POA: Diagnosis not present

## 2020-04-28 DIAGNOSIS — J910 Malignant pleural effusion: Secondary | ICD-10-CM | POA: Diagnosis not present

## 2020-04-28 DIAGNOSIS — J9602 Acute respiratory failure with hypercapnia: Secondary | ICD-10-CM | POA: Diagnosis not present

## 2020-04-28 DIAGNOSIS — J9601 Acute respiratory failure with hypoxia: Secondary | ICD-10-CM | POA: Diagnosis not present

## 2020-04-28 DIAGNOSIS — I130 Hypertensive heart and chronic kidney disease with heart failure and stage 1 through stage 4 chronic kidney disease, or unspecified chronic kidney disease: Secondary | ICD-10-CM | POA: Diagnosis not present

## 2020-04-29 ENCOUNTER — Inpatient Hospital Stay (HOSPITAL_BASED_OUTPATIENT_CLINIC_OR_DEPARTMENT_OTHER): Payer: Medicare Other | Admitting: Hematology

## 2020-04-29 ENCOUNTER — Inpatient Hospital Stay: Payer: Medicare Other

## 2020-04-29 ENCOUNTER — Ambulatory Visit: Payer: Medicare Other

## 2020-04-29 ENCOUNTER — Encounter: Payer: Self-pay | Admitting: Hematology

## 2020-04-29 ENCOUNTER — Other Ambulatory Visit: Payer: Self-pay

## 2020-04-29 ENCOUNTER — Telehealth: Payer: Self-pay | Admitting: Hematology

## 2020-04-29 VITALS — BP 110/48 | HR 76 | Temp 97.7°F | Resp 18 | Ht 62.0 in | Wt 197.4 lb

## 2020-04-29 DIAGNOSIS — C50412 Malignant neoplasm of upper-outer quadrant of left female breast: Secondary | ICD-10-CM | POA: Diagnosis not present

## 2020-04-29 DIAGNOSIS — E119 Type 2 diabetes mellitus without complications: Secondary | ICD-10-CM | POA: Diagnosis not present

## 2020-04-29 DIAGNOSIS — N1831 Chronic kidney disease, stage 3a: Secondary | ICD-10-CM | POA: Diagnosis not present

## 2020-04-29 DIAGNOSIS — C7951 Secondary malignant neoplasm of bone: Secondary | ICD-10-CM | POA: Diagnosis not present

## 2020-04-29 DIAGNOSIS — Z9221 Personal history of antineoplastic chemotherapy: Secondary | ICD-10-CM | POA: Diagnosis not present

## 2020-04-29 DIAGNOSIS — E785 Hyperlipidemia, unspecified: Secondary | ICD-10-CM | POA: Diagnosis not present

## 2020-04-29 DIAGNOSIS — I10 Essential (primary) hypertension: Secondary | ICD-10-CM | POA: Diagnosis not present

## 2020-04-29 DIAGNOSIS — Z923 Personal history of irradiation: Secondary | ICD-10-CM | POA: Diagnosis not present

## 2020-04-29 DIAGNOSIS — Z79811 Long term (current) use of aromatase inhibitors: Secondary | ICD-10-CM | POA: Diagnosis not present

## 2020-04-29 DIAGNOSIS — D649 Anemia, unspecified: Secondary | ICD-10-CM

## 2020-04-29 DIAGNOSIS — Z794 Long term (current) use of insulin: Secondary | ICD-10-CM | POA: Diagnosis not present

## 2020-04-29 DIAGNOSIS — N183 Chronic kidney disease, stage 3 unspecified: Secondary | ICD-10-CM | POA: Diagnosis not present

## 2020-04-29 DIAGNOSIS — Z170 Estrogen receptor positive status [ER+]: Secondary | ICD-10-CM

## 2020-04-29 DIAGNOSIS — M8580 Other specified disorders of bone density and structure, unspecified site: Secondary | ICD-10-CM | POA: Diagnosis not present

## 2020-04-29 DIAGNOSIS — F329 Major depressive disorder, single episode, unspecified: Secondary | ICD-10-CM | POA: Diagnosis not present

## 2020-04-29 DIAGNOSIS — Z79899 Other long term (current) drug therapy: Secondary | ICD-10-CM | POA: Diagnosis not present

## 2020-04-29 DIAGNOSIS — F419 Anxiety disorder, unspecified: Secondary | ICD-10-CM | POA: Diagnosis not present

## 2020-04-29 DIAGNOSIS — Z7951 Long term (current) use of inhaled steroids: Secondary | ICD-10-CM | POA: Diagnosis not present

## 2020-04-29 DIAGNOSIS — J45909 Unspecified asthma, uncomplicated: Secondary | ICD-10-CM | POA: Diagnosis not present

## 2020-04-29 DIAGNOSIS — C7801 Secondary malignant neoplasm of right lung: Secondary | ICD-10-CM | POA: Diagnosis not present

## 2020-04-29 LAB — COMPREHENSIVE METABOLIC PANEL
ALT: <6 U/L (ref 0–44)
AST: 18 U/L (ref 15–41)
Albumin: 2.4 g/dL — ABNORMAL LOW (ref 3.5–5.0)
Alkaline Phosphatase: 130 U/L — ABNORMAL HIGH (ref 38–126)
Anion gap: 9 (ref 5–15)
BUN: 13 mg/dL (ref 8–23)
CO2: 31 mmol/L (ref 22–32)
Calcium: 9.4 mg/dL (ref 8.9–10.3)
Chloride: 98 mmol/L (ref 98–111)
Creatinine, Ser: 1.17 mg/dL — ABNORMAL HIGH (ref 0.44–1.00)
GFR calc Af Amer: 52 mL/min — ABNORMAL LOW (ref >60)
GFR calc non Af Amer: 45 mL/min — ABNORMAL LOW (ref >60)
Glucose, Bld: 108 mg/dL — ABNORMAL HIGH (ref 70–99)
Potassium: 4.5 mmol/L (ref 3.5–5.1)
Sodium: 138 mmol/L (ref 135–145)
Total Bilirubin: 0.3 mg/dL (ref 0.3–1.2)
Total Protein: 6 g/dL — ABNORMAL LOW (ref 6.5–8.1)

## 2020-04-29 LAB — CBC WITH DIFFERENTIAL/PLATELET
Abs Immature Granulocytes: 0.29 10*3/uL — ABNORMAL HIGH (ref 0.00–0.07)
Basophils Absolute: 0 10*3/uL (ref 0.0–0.1)
Basophils Relative: 0 %
Eosinophils Absolute: 0.1 10*3/uL (ref 0.0–0.5)
Eosinophils Relative: 2 %
HCT: 23.2 % — ABNORMAL LOW (ref 36.0–46.0)
Hemoglobin: 7.7 g/dL — ABNORMAL LOW (ref 12.0–15.0)
Immature Granulocytes: 4 %
Lymphocytes Relative: 9 %
Lymphs Abs: 0.7 10*3/uL (ref 0.7–4.0)
MCH: 30 pg (ref 26.0–34.0)
MCHC: 33.2 g/dL (ref 30.0–36.0)
MCV: 90.3 fL (ref 80.0–100.0)
Monocytes Absolute: 0.4 10*3/uL (ref 0.1–1.0)
Monocytes Relative: 6 %
Neutro Abs: 5.7 10*3/uL (ref 1.7–7.7)
Neutrophils Relative %: 79 %
Platelets: 272 10*3/uL (ref 150–400)
RBC: 2.57 MIL/uL — ABNORMAL LOW (ref 3.87–5.11)
RDW: 18.4 % — ABNORMAL HIGH (ref 11.5–15.5)
WBC: 7.3 10*3/uL (ref 4.0–10.5)
nRBC: 0 % (ref 0.0–0.2)

## 2020-04-29 NOTE — Telephone Encounter (Signed)
Scheduled per 7/29 los. Printed avs and calendar for pt.  

## 2020-04-30 ENCOUNTER — Telehealth: Payer: Self-pay

## 2020-04-30 ENCOUNTER — Telehealth: Payer: Self-pay | Admitting: Hematology

## 2020-04-30 DIAGNOSIS — J9601 Acute respiratory failure with hypoxia: Secondary | ICD-10-CM | POA: Diagnosis not present

## 2020-04-30 DIAGNOSIS — C782 Secondary malignant neoplasm of pleura: Secondary | ICD-10-CM | POA: Diagnosis not present

## 2020-04-30 DIAGNOSIS — J910 Malignant pleural effusion: Secondary | ICD-10-CM | POA: Diagnosis not present

## 2020-04-30 DIAGNOSIS — C50912 Malignant neoplasm of unspecified site of left female breast: Secondary | ICD-10-CM | POA: Diagnosis not present

## 2020-04-30 DIAGNOSIS — I130 Hypertensive heart and chronic kidney disease with heart failure and stage 1 through stage 4 chronic kidney disease, or unspecified chronic kidney disease: Secondary | ICD-10-CM | POA: Diagnosis not present

## 2020-04-30 DIAGNOSIS — J9602 Acute respiratory failure with hypercapnia: Secondary | ICD-10-CM | POA: Diagnosis not present

## 2020-04-30 NOTE — Telephone Encounter (Signed)
-----   Message from [name], MD sent at 04/29/2020  5:37 PM EDT ----- She did not have type and cross today, please call her and schedule for tomorrow. Ok to postpone lab and blood transfusion to next week if she wants  Thanks   Krista Blue

## 2020-04-30 NOTE — Telephone Encounter (Signed)
I called and spoke with [name] to informed her that Dr. Burr Medico wants her to have an type and cross lab done tomorrow. Pt verbalized understanding.   Sent scheduling an message to put her on lab schedule for tomorrow.   Atha Starks CMA

## 2020-04-30 NOTE — Telephone Encounter (Signed)
R/s apt per 7/30 sch msg - labs unable to be drawn on 7/31 - moved blood to next wk  - pt son is aware of apt.

## 2020-05-01 ENCOUNTER — Inpatient Hospital Stay: Payer: Medicare Other

## 2020-05-02 ENCOUNTER — Encounter (HOSPITAL_COMMUNITY): Payer: Self-pay | Admitting: Emergency Medicine

## 2020-05-02 ENCOUNTER — Emergency Department (HOSPITAL_COMMUNITY)
Admission: EM | Admit: 2020-05-02 | Discharge: 2020-05-02 | Disposition: A | Discharge status: Home / Self Care | Payer: Medicare Other | Attending: Emergency Medicine | Admitting: Emergency Medicine

## 2020-05-02 DIAGNOSIS — Z853 Personal history of malignant neoplasm of breast: Secondary | ICD-10-CM | POA: Insufficient documentation

## 2020-05-02 DIAGNOSIS — N189 Chronic kidney disease, unspecified: Secondary | ICD-10-CM | POA: Diagnosis not present

## 2020-05-02 DIAGNOSIS — Z7401 Bed confinement status: Secondary | ICD-10-CM | POA: Diagnosis not present

## 2020-05-02 DIAGNOSIS — E162 Hypoglycemia, unspecified: Secondary | ICD-10-CM | POA: Diagnosis not present

## 2020-05-02 DIAGNOSIS — R0902 Hypoxemia: Secondary | ICD-10-CM | POA: Diagnosis not present

## 2020-05-02 DIAGNOSIS — E11649 Type 2 diabetes mellitus with hypoglycemia without coma: Secondary | ICD-10-CM | POA: Diagnosis not present

## 2020-05-02 DIAGNOSIS — E161 Other hypoglycemia: Secondary | ICD-10-CM | POA: Diagnosis not present

## 2020-05-02 DIAGNOSIS — M2550 Pain in unspecified joint: Secondary | ICD-10-CM | POA: Diagnosis not present

## 2020-05-02 DIAGNOSIS — R404 Transient alteration of awareness: Secondary | ICD-10-CM | POA: Diagnosis not present

## 2020-05-02 DIAGNOSIS — I959 Hypotension, unspecified: Secondary | ICD-10-CM | POA: Diagnosis not present

## 2020-05-02 DIAGNOSIS — R5381 Other malaise: Secondary | ICD-10-CM | POA: Diagnosis not present

## 2020-05-02 DIAGNOSIS — I129 Hypertensive chronic kidney disease with stage 1 through stage 4 chronic kidney disease, or unspecified chronic kidney disease: Secondary | ICD-10-CM | POA: Insufficient documentation

## 2020-05-02 LAB — CBG MONITORING, ED
Glucose-Capillary: 85 mg/dL (ref 70–99)
Glucose-Capillary: 82 mg/dL (ref 70–99)

## 2020-05-02 NOTE — ED Notes (Signed)
[facility] called  

## 2020-05-02 NOTE — ED Triage Notes (Signed)
Patient here from home via EMS reporting hypoglycemic episode. Initial bg 30 at home. Reports 1mg  of glucagon IM given, ginger ale, and graham crackers given. AAO x4. Normally on 4L O2 home.

## 2020-05-02 NOTE — ED Provider Notes (Signed)
[facility] Emergency Department Provider Note MRN:  [id number]  Arrival date & time: 05/02/20     Chief Complaint   Hypoglycemia   History of Present Illness   Wendy [name] is a 79 y.o. year-old female with a history of stage IV breast cancer, diabetes presenting to the ED with chief complaint of hyperglycemia.  Episode of confusion, diaphoresis this morning, blood sugar found to be 30, improving with glucagon and ginger ale via EMS.  Similar ED visit few days ago.  Denies fever, no cough, no dysuria, no other complaints.  Feels back to normal at this time.  Patient's son manages her medications.  Review of Systems  A complete 10 system review of systems was obtained and all systems are negative except as noted in the HPI and PMH.   Patient's Health History    Past Medical History:  Diagnosis Date  . Abnormal breast finding 2018   per pt/ having  a lot drainage from left breast nipple  . Anemia   . Anxiety   . Breast cancer ([place]) 11/2016   left/  . CKD (chronic kidney disease)   . Depression   . GERD (gastroesophageal reflux disease)    TUMS as needed  . HTN (hypertension)    states BP has been high recently; has been on med. x 20 yr.  . Hyperlipidemia   . Hyperplastic colon polyp   . HYPERTENSION, BENIGN SYSTEMIC 11/29/2006   Lisinopril hctz 10-12.5mg , amlodipine 10mg , coreg 25mg  BID, clonidine 0.3 mg BID.   Marland Kitchen Insulin dependent diabetes mellitus   . Personal history of chemotherapy    2018/finished 6 weeks of chemo in Sep 2018  . Personal history of radiation therapy    2018 left breast/finished radiation in Sept 2018 per pt.    Past Surgical History:  Procedure Laterality Date  . AXILLARY LYMPH NODE DISSECTION Left 08/05/2019   Procedure: LEFT AXILLARY LYMPH NODE DISSECTION;  Surgeon: Erroll Luna, MD;  Location: [place];  Service: General;  Laterality: Left;  . BREAST LUMPECTOMY Left 05/05/2008  . BREAST LUMPECTOMY Left  12/07/2016   malignant  . BREAST LUMPECTOMY WITH RADIOACTIVE SEED AND SENTINEL LYMPH NODE BIOPSY Left 12/07/2016   Procedure: LEFT BREAST LUMPECTOMY WITH RADIOACTIVE SEED AND SENTINEL LYMPH NODE BIOPSY;  Surgeon: Erroll Luna, MD;  Location: [place];  Service: General;  Laterality: Left;  . IR FLUORO GUIDE PORT INSERTION RIGHT  01/04/2017  . IR PERC PLEURAL DRAIN W/INDWELL CATH W/IMG GUIDE  04/12/2020  . IR THORACENTESIS ASP PLEURAL SPACE W/IMG GUIDE  03/02/2020  . IR US GUIDE VASC ACCESS RIGHT  01/04/2017  . PORT-A-CATH REMOVAL N/A 05/30/2017   Procedure: REMOVAL PORT-A-CATH;  Surgeon: Erroll Luna, MD;  Location: [place];  Service: General;  Laterality: N/A;  . RE-EXCISION OF BREAST LUMPECTOMY Left 12/28/2016   Procedure: RE-EXCISION OF BREAST LUMPECTOMY;  Surgeon: Erroll Luna, MD;  Location: [facility] OR;  Service: General;  Laterality: Left;    Family History  Problem Relation Age of Onset  . Diabetes Sister        x 4  . Heart failure Sister   . Diabetes Brother   . Heart disease Brother   . Diabetes Paternal Grandmother   . Diabetes Paternal Aunt   . Heart attack Brother   . Arthritis Sister   . Hypertension Sister   . Diabetes Sister   . Emphysema Brother   . Cancer Father 76  brain tumor   . Diabetes Child   . Asthma Child   . Hypertension Child   . Diabetes Child   . Diabetes Child   . Colon cancer Neg Hx   . Stomach cancer Neg Hx     Social History   Socioeconomic History  . Marital status: Widowed    Spouse name: Not on file  . Number of children: 4  . Years of education: Not on file  . Highest education level: Not on file  Occupational History  . Occupation: DAY Armed forces operational officer: [employer]  Tobacco Use  . Smoking status: Never Smoker  . Smokeless tobacco: Former Systems developer    Types: Snuff  Vaping Use  . Vaping Use: Never used  Substance and Sexual Activity  . Alcohol use: No    Alcohol/week: 0.0 standard drinks    . Drug use: No  . Sexual activity: Not Currently    Partners: Male  Other Topics Concern  . Not on file  Social History Narrative  . Not on file   Social Determinants of Health   Financial Resource Strain:   . Difficulty of Paying Living Expenses:   Food Insecurity:   . Worried About Charity fundraiser in the Last Year:   . Arboriculturist in the Last Year:   Transportation Needs:   . Film/video editor (Medical):   Marland Kitchen Lack of Transportation (Non-Medical):   Physical Activity:   . Days of Exercise per Week:   . Minutes of Exercise per Session:   Stress:   . Feeling of Stress :   Social Connections:   . Frequency of Communication with Friends and Family:   . Frequency of Social Gatherings with Friends and Family:   . Attends Religious Services:   . Active Member of Clubs or Organizations:   . Attends Archivist Meetings:   Marland Kitchen Marital Status:   Intimate Partner Violence:   . Fear of Current or Ex-Partner:   . Emotionally Abused:   Marland Kitchen Physically Abused:   . Sexually Abused:      Physical Exam   Vitals:   05/02/20 0832  BP: 112/80  Pulse: 75  Resp: 22  Temp: 98.9 F (37.2 C)  SpO2: 100%    CONSTITUTIONAL: Well-appearing, NAD NEURO:  Alert and oriented x 3, no focal deficits EYES:  eyes equal and reactive ENT/NECK:  no LAD, no JVD CARDIO: Regular rate, well-perfused, normal S1 and S2 PULM:  CTAB no wheezing or rhonchi GI/GU:  normal bowel sounds, non-distended, non-tender MSK/SPINE:  No gross deformities, no edema SKIN:  no rash, atraumatic PSYCH:  Appropriate speech and behavior  *Additional and/or pertinent findings included in MDM below  Diagnostic and Interventional Summary    EKG Interpretation  Date/Time:    Ventricular Rate:    PR Interval:    QRS Duration:   QT Interval:    QTC Calculation:   R Axis:     Text Interpretation:        Labs Reviewed  CBG MONITORING, ED  CBG MONITORING, ED    No orders to display     Medications - No data to display   Procedures  /  Critical Care Procedures  ED Course and Medical Decision Making  I have reviewed the triage vital signs, the nursing notes, and pertinent available records from the EMR.  Listed above are laboratory and imaging tests that I personally ordered, reviewed, and interpreted and then  considered in my medical decision making (see below for details).      Hypoglycemia, suspect related to patient's insulin regimen at home.  No fever, normal vital signs, no complaints at this time, denies any infectious symptoms.  Recent work-up for similar presentation revealing no secondary cause of hyperglycemia.  Son has been trying to manage her sugars at home, trying to follow the instructions from primary care doctor.  Gives NovoLog with meals, is told to give the glargine if sugar is greater than 270 at any time during the day.  Seems like an uncommon regiment.  Currently I see no reason for additional infectious work-up or testing, has close follow-up early this coming week.  Son does have concerns about being able to care for her at home, will see if case management can provide any other home health resources.  Repeat sugar is again normal, no complaints by patient, appropriate for discharge.  Barth Kirks. Sedonia Small, MD [place] [email or web address]  Final Clinical Impressions(s) / ED Diagnoses     ICD-10-CM   1. Hypoglycemia  E16.2     ED Discharge Orders    None       Discharge Instructions Discussed with and Provided to Patient:     Discharge Instructions     You were evaluated in the Emergency Department and after careful evaluation, we did not find any emergent condition requiring admission or further testing in the hospital.  Your exam/testing today is overall reassuring. Please follow-up with your primary care doctor to discuss your insulin dosing.  Please return to the Emergency Department if  you experience any worsening of your condition.   Thank you for allowing [name] to be a part of your care.       Maudie Flakes, MD 05/02/20 1128

## 2020-05-02 NOTE — ED Notes (Signed)
Pt. Received a breakfast tray. Nurse aware.

## 2020-05-02 NOTE — Social Work (Signed)
CSW met with Pt at bedside. Pt reports that she lives with son, Wendy [name] and receives [facility] services with which she is satisfied. Pt states that she would like a larger hospital bed. CSW notes that Pt's PCP is already aware of the request and looking into possible change of bed.   CSW spoke with son, Wendy [name] via phone @ [phone] to discuss his concerns with Pt's care.  CSW counseled Wendy [name] to reach out to # on Medicare card to find information about respite services available as well as to discuss concerns with palliative care team (RN and LCSW) already in place with [facility] to facilitate conversations between himself and Pt.  CSW updated EDP CSW updated [facility] that Pt had been in ED.  Vergie Living MSW LCSWA Transitions of Care  Clinical Social Worker  [facility] Emergency Departments  [phone]

## 2020-05-02 NOTE — ED Notes (Signed)
Spoke with son about discharge

## 2020-05-02 NOTE — Discharge Instructions (Addendum)
You were evaluated in the Emergency Department and after careful evaluation, we did not find any emergent condition requiring admission or further testing in the hospital.  Your exam/testing today is overall reassuring. Please follow-up with your primary care doctor to discuss your insulin dosing.  Please return to the Emergency Department if you experience any worsening of your condition.   Thank you for allowing [name] to be a part of your care.

## 2020-05-04 ENCOUNTER — Inpatient Hospital Stay: Payer: Medicare Other

## 2020-05-04 ENCOUNTER — Other Ambulatory Visit: Payer: Self-pay

## 2020-05-04 ENCOUNTER — Telehealth: Payer: Self-pay

## 2020-05-04 ENCOUNTER — Other Ambulatory Visit: Payer: Self-pay | Admitting: Emergency Medicine

## 2020-05-04 ENCOUNTER — Inpatient Hospital Stay: Payer: Medicare Other | Attending: Hematology

## 2020-05-04 DIAGNOSIS — Z170 Estrogen receptor positive status [ER+]: Secondary | ICD-10-CM | POA: Diagnosis not present

## 2020-05-04 DIAGNOSIS — D649 Anemia, unspecified: Secondary | ICD-10-CM | POA: Diagnosis not present

## 2020-05-04 DIAGNOSIS — I130 Hypertensive heart and chronic kidney disease with heart failure and stage 1 through stage 4 chronic kidney disease, or unspecified chronic kidney disease: Secondary | ICD-10-CM | POA: Diagnosis not present

## 2020-05-04 DIAGNOSIS — C50412 Malignant neoplasm of upper-outer quadrant of left female breast: Secondary | ICD-10-CM

## 2020-05-04 DIAGNOSIS — C50912 Malignant neoplasm of unspecified site of left female breast: Secondary | ICD-10-CM | POA: Diagnosis not present

## 2020-05-04 DIAGNOSIS — J910 Malignant pleural effusion: Secondary | ICD-10-CM | POA: Diagnosis not present

## 2020-05-04 DIAGNOSIS — J9602 Acute respiratory failure with hypercapnia: Secondary | ICD-10-CM | POA: Diagnosis not present

## 2020-05-04 DIAGNOSIS — J9601 Acute respiratory failure with hypoxia: Secondary | ICD-10-CM | POA: Diagnosis not present

## 2020-05-04 DIAGNOSIS — N1831 Chronic kidney disease, stage 3a: Secondary | ICD-10-CM

## 2020-05-04 DIAGNOSIS — C782 Secondary malignant neoplasm of pleura: Secondary | ICD-10-CM | POA: Diagnosis not present

## 2020-05-04 LAB — CBC WITH DIFFERENTIAL/PLATELET
Abs Immature Granulocytes: 0.1 10*3/uL — ABNORMAL HIGH (ref 0.00–0.07)
Basophils Absolute: 0 10*3/uL (ref 0.0–0.1)
Basophils Relative: 0 %
Eosinophils Absolute: 0.3 10*3/uL (ref 0.0–0.5)
Eosinophils Relative: 4 %
HCT: 23.3 % — ABNORMAL LOW (ref 36.0–46.0)
Hemoglobin: 7.6 g/dL — ABNORMAL LOW (ref 12.0–15.0)
Immature Granulocytes: 1 %
Lymphocytes Relative: 9 %
Lymphs Abs: 0.7 10*3/uL (ref 0.7–4.0)
MCH: 29.3 pg (ref 26.0–34.0)
MCHC: 32.6 g/dL (ref 30.0–36.0)
MCV: 90 fL (ref 80.0–100.0)
Monocytes Absolute: 0.6 10*3/uL (ref 0.1–1.0)
Monocytes Relative: 8 %
Neutro Abs: 6.1 10*3/uL (ref 1.7–7.7)
Neutrophils Relative %: 78 %
Platelets: 280 10*3/uL (ref 150–400)
RBC: 2.59 MIL/uL — ABNORMAL LOW (ref 3.87–5.11)
RDW: 18.6 % — ABNORMAL HIGH (ref 11.5–15.5)
WBC: 7.8 10*3/uL (ref 4.0–10.5)
nRBC: 0 % (ref 0.0–0.2)

## 2020-05-04 LAB — COMPREHENSIVE METABOLIC PANEL
ALT: <6 U/L (ref 0–44)
AST: 15 U/L (ref 15–41)
Albumin: 2.5 g/dL — ABNORMAL LOW (ref 3.5–5.0)
Alkaline Phosphatase: 135 U/L — ABNORMAL HIGH (ref 38–126)
Anion gap: 9 (ref 5–15)
BUN: 11 mg/dL (ref 8–23)
CO2: 32 mmol/L (ref 22–32)
Calcium: 10 mg/dL (ref 8.9–10.3)
Chloride: 96 mmol/L — ABNORMAL LOW (ref 98–111)
Creatinine, Ser: 1.14 mg/dL — ABNORMAL HIGH (ref 0.44–1.00)
GFR calc Af Amer: 53 mL/min — ABNORMAL LOW (ref >60)
GFR calc non Af Amer: 46 mL/min — ABNORMAL LOW (ref >60)
Glucose, Bld: 141 mg/dL — ABNORMAL HIGH (ref 70–99)
Potassium: 4.6 mmol/L (ref 3.5–5.1)
Sodium: 137 mmol/L (ref 135–145)
Total Bilirubin: 0.3 mg/dL (ref 0.3–1.2)
Total Protein: 6.2 g/dL — ABNORMAL LOW (ref 6.5–8.1)

## 2020-05-04 LAB — PREPARE RBC (CROSSMATCH)

## 2020-05-04 MED ORDER — SODIUM CHLORIDE 0.9% IV SOLUTION
250.0000 mL | Freq: Once | INTRAVENOUS | Status: AC
  Administered 2020-05-04: 250 mL via INTRAVENOUS
  Filled 2020-05-04: qty 250

## 2020-05-04 NOTE — Patient Instructions (Signed)

## 2020-05-04 NOTE — Telephone Encounter (Signed)
Palliative care SW spoke with patient son. Scheduled in home visit for Fri 05-07-2020 @1130am .

## 2020-05-04 NOTE — Telephone Encounter (Signed)
FAXED [facility]: PAP Refill Request Other records requested: None  All above requested information has been faxed successfully to [employer] listed above. Documents and fax confirmation have been placed in the faxed file for future reference.

## 2020-05-04 NOTE — Progress Notes (Signed)
Pt received 2 units PRBCs today, tolerated well.  VSS.  Able to eat/drink and use bedside commode w/out any issues.  Escorted via w/c to exit with belongings and home O2 tank, son taking pt home.  Pt aware to call CC if she has any changes including transfusion reaction or fluid overload symptoms, denies any increased SOB/fatigue at time of d/c (no additional edema noted at time of d/c by RN).

## 2020-05-05 ENCOUNTER — Ambulatory Visit: Payer: Medicare Other | Admitting: Family Medicine

## 2020-05-05 LAB — BPAM RBC
Blood Product Expiration Date: 202109022359
Blood Product Expiration Date: 202109022359
ISSUE DATE / TIME: 202108030945
ISSUE DATE / TIME: 202108030945
Unit Type and Rh: 7300
Unit Type and Rh: 7300

## 2020-05-05 LAB — TYPE AND SCREEN
ABO/RH(D): B POS
Antibody Screen: NEGATIVE
Unit division: 0
Unit division: 0

## 2020-05-06 ENCOUNTER — Encounter: Payer: Self-pay | Admitting: Family Medicine

## 2020-05-06 ENCOUNTER — Ambulatory Visit (INDEPENDENT_AMBULATORY_CARE_PROVIDER_SITE_OTHER): Payer: Medicare Other | Admitting: Family Medicine

## 2020-05-06 ENCOUNTER — Other Ambulatory Visit: Payer: Self-pay

## 2020-05-06 VITALS — BP 122/60 | HR 86 | Temp 98.2°F | Ht 62.0 in | Wt 197.0 lb

## 2020-05-06 DIAGNOSIS — IMO0002 Reserved for concepts with insufficient information to code with codable children: Secondary | ICD-10-CM

## 2020-05-06 DIAGNOSIS — I10 Essential (primary) hypertension: Secondary | ICD-10-CM | POA: Diagnosis not present

## 2020-05-06 DIAGNOSIS — E113419 Type 2 diabetes mellitus with severe nonproliferative diabetic retinopathy with macular edema, unspecified eye: Secondary | ICD-10-CM | POA: Diagnosis not present

## 2020-05-06 DIAGNOSIS — E1165 Type 2 diabetes mellitus with hyperglycemia: Secondary | ICD-10-CM

## 2020-05-06 DIAGNOSIS — Z794 Long term (current) use of insulin: Secondary | ICD-10-CM

## 2020-05-06 NOTE — Progress Notes (Signed)
Phone [phone] In person visit   Subjective:   Wendy [name] is a 79 y.o. year old very pleasant female patient who presents for/with See problem oriented charting Chief Complaint  Patient presents with  . Follow-up    This visit occurred during the SARS-CoV-2 public health emergency.  Safety protocols were in place, including screening questions prior to the visit, additional usage of staff PPE, and extensive cleaning of exam room while observing appropriate contact time as indicated for disinfecting solutions.   Past Medical History-  Patient Active Problem List   Diagnosis Date Noted  . DNR (do not resuscitate) 03/24/2020    Priority: High  . 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 Metastatic breast cancer ([place]) 03/02/2020    Priority: High  . Shortness of breath 03/14/2017    Priority: High  . Malignant neoplasm of upper-outer quadrant of left breast in female, estrogen receptor positive ([place]) 11/20/2016    Priority: High  . 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 Moderate persistent asthma 01/31/2020    Priority: Medium  . GERD (gastroesophageal reflux disease) 01/31/2020    Priority: Medium  . Lymphedema of left arm 09/03/2019    Priority: Medium  . 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[place]) 12/05/2018    Priority: Medium  . Anemia 01/09/2018    Priority: Medium  . Rectal bleeding 01/17/2017    Priority: Medium  . CKD (chronic kidney disease) stage 3, GFR 30-59 ml/min (H[facility]) 03/17/2016    Priority: Medium  . TOBACCO USE 04/13/2008    Priority: Medium  . Hyperlipidemia associated with type 2 diabetes mellitus ([place]) 11/29/2006    Priority: Medium  . Morbid obesity ([place]) 11/29/2006    Priority: Medium  . Essential hypertension 11/29/2006    Priority: Medium  . Advanced care planning/counseling discussion      Priority: Low  . Allergic rhinitis 01/31/2020    Priority: Low  . Goals of care, counseling/discussion 01/08/2020    Priority: Low  . Fatigue 03/14/2017    Priority: Low  . Edema 03/14/2017    Priority: Low  . Port catheter in place 01/18/2017    Priority: Low  . Constipation 01/17/2017    Priority: Low  . 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 Palliative care by specialist   . Grief 05/10/2018    Medications- reviewed and updated Current Outpatient Medications  Medication Sig Dispense Refill  . albuterol (VENTOLIN HFA) 108 (90 Base) MCG/ACT inhaler Inhale 2 puffs into the lungs every 6 (six) hours as needed for wheezing or shortness of breath. 18 g 2  . amLODipine (NORVASC) 10 MG tablet TAKE 1 TABLET BY MOUTH EVERY DAY (Patient taking differently: Take 10 mg by mouth daily. ) 90 tablet 1  . B-D INS SYRINGE 0.5CC/31GX5/16 31G X 5/16" 0.5 ML MISC USE AS DIRECTED 4 TIMES A DAY 300 each 1  . benzonatate (TESSALON PERLES) 100 MG capsule Take 1 capsule (100 mg total) by mouth 3 (three) times daily as needed for cough. 30 capsule 2  . busPIRone (BUSPAR) 10 MG tablet Take 1 tablet (10 mg total) by mouth 3 (three) times daily. 270 tablet 1  . carvedilol (COREG) 25 MG tablet Take 1 tablet (25 mg total) by mouth 2 (two) times daily with a meal. 180 tablet 3  . cloNIDine (CATAPRES) 0.3 MG tablet TAKE 1 TABLET (0.3  MG TOTAL) BY MOUTH 2 (TWO) TIMES DAILY. 180 tablet 1  . docusate sodium (COLACE) 100 MG capsule Take 1 capsule (100 mg total) by mouth 2 (two) times daily. 10 capsule 0  . famotidine (PEPCID) 20 MG tablet TAKE 1 TABLET BY MOUTH EVERY DAY (Patient taking differently: Take 20 mg by mouth daily. ) 90 tablet 1  . fluticasone (FLONASE) 50 MCG/ACT nasal spray PLACE 1 SPRAY INTO BOTH NOSTRILS DAILY. 48 mL 3  . fluticasone furoate-vilanterol (BREO ELLIPTA) 100-25 MCG/INH AEPB Inhale 1 puff into the lungs daily. 14 each 0  . furosemide (LASIX) 20 MG tablet Take 20 mg by mouth daily.     . insulin glargine (LANTUS SOLOSTAR) 100 UNIT/ML Solostar Pen Inject 30 Units into the skin daily. Inject 32 units into the skin in am (Patient taking differently: Inject 32 Units into the skin in the morning. ) 10 pen 3  . Insulin Pen Needle 31G X 5 MM MISC Use pen needles for insulin injection daily 100 each 3  . insulin regular (NOVOLIN R) 100 units/mL injection Inject 0.02-0.03 mLs (2-3 Units total) into the skin 3 (three) times daily before meals. If BG persistently greater than 250 on QID accuchecks at home. Can use as Sliding scale insulin as well 20 mL 11  . Insulin Syringe-Needle U-100 (B-D INS SYRINGE 0.5CC/30GX1/2") 30G X 1/2" 0.5 ML MISC USE AS DIRECTED 4 TIMES A DAY 300 each 3  . lisinopril (ZESTRIL) 5 MG tablet Take 1 tablet (5 mg total) by mouth daily. 30 tablet 11  . lovastatin (MEVACOR) 40 MG tablet TAKE 1 TABLET BY MOUTH EVERYDAY AT BEDTIME (Patient taking differently: Take 40 mg by mouth at bedtime. ) 90 tablet 3  . mirtazapine (REMERON) 7.5 MG tablet Take 1 tablet (7.5 mg total) by mouth at bedtime. 30 tablet 2  . montelukast (SINGULAIR) 10 MG tablet Take 1 tablet (10 mg total) by mouth at bedtime. 30 tablet 3  . ondansetron (ZOFRAN) 8 MG tablet Take 1 tablet (8 mg total) by mouth 2 (two) times daily as needed (Nausea or vomiting). 30 tablet 1  . oxyCODONE (OXY IR/ROXICODONE) 5 MG immediate release tablet Take 1 tablet (5 mg total) by mouth every 6 (six) hours as needed for moderate pain or severe pain. Anticipate ongoing need for pain in cancer patient with pleurx catheter- im refilling early as Im out of town next week. 20 tablet 0  . polyethylene glycol powder (GLYCOLAX/MIRALAX) 17 GM/SCOOP powder Take 17 g by mouth daily. (Patient taking differently: Take 17 g by mouth as needed for mild constipation or moderate constipation. ) 1 g 0  . prochlorperazine (COMPAZINE) 10 MG tablet Take 1 tablet (10 mg total) by mouth every 6 (six) hours as needed for nausea or vomiting. 30 tablet  1  . traMADol (ULTRAM) 50 MG tablet Take 50 mg by mouth every 6 (six) hours as needed for moderate pain.    . XELODA 500 MG tablet Take 3 tablets (1,500 mg total) by mouth 2 (two) times daily after a meal. Take for 14 days, then hold for 7 days. Repeat every 21 days. 84 tablet 1   No current facility-administered medications for this visit.     Objective:  BP 122/60   Pulse 86   Temp 98.2 F (36.8 C)   Ht 5\' 2"  (1.575 m)   Wt 197 lb (89.4 kg)   LMP  (LMP Unknown)   SpO2 100%   BMI 36.03 kg/m  Gen: NAD, resting  comfortably CV: RRR no murmurs rubs or gallops Lungs: no labored breathing, does wear oxygen today Ext: no edema Skin: warm, dry Neuro: in wheelchair- requires support to get into her vehile    Assessment and Plan   # Diabetes S: Medication: Patient with an abnormal regimen that seems to have been crafted at at home-using a combination of as needed Lantus and as needed Novolin.  Previously I believe patient was using a sliding scale from endocrine but that does not seem to be the case. -They are using Lantus at night only if blood sugar is high. Unclear how many days per month she takes lantus and when she does take it gives 18-20.  10 units of novolin before she eats or if sugar is high - at least once a day and then they monitor and see if sugar gets high. They estimate how much to give.   When sugar got low on 05/02/20 didn't eat well night before- had 10 units of novolin the night before with high sugar.  Sugar was 260 and gave her 10 units of lantus also it sounds like   CBGs-  Does not bring long Lab Results  Component Value Date   HGBA1C 7.2 (H) 03/02/2020   HGBA1C 8.5 (H) 11/07/2019   HGBA1C 8.4 (A) 07/24/2019    A/P: A1c is controlled but I am very concerned about hypoglycemia risk with atypical regimen.  We are going to attempt to simplify the regimen.  We are going to let blood sugar run a little bit higher short-term. -Goal to keep blood sugar between  100-350 overall -I prefer evening Lantus-we will do 10 units tonight since she already had some Novolin earlier today.  We will do 15 units tomorrow night. -If blood sugar before meals is above 250 can give 5 units of Novolin -Update me on Monday -Asked team to refer patient back to endocrinology-looking back it has been many months since she has seen them -For sugars below 100 or above 350 I asked them to call me -I plan to adjust Lantus to target morning blood sugar between 100-150 -We went over the plan several times today  #hypertension S: medication: amlodipine, carvedilol, clonidine, lisinopril BP Readings from Last 3 Encounters:  05/06/20 122/60  05/04/20 136/75  05/02/20 118/65  A/P: Excellent control today-continue current medication  #Social update-patient also updates me that cancer treatment has been challenging for her.  She also has been discouraged the fact she needs transfusions-she is feeling some ups and downs with treatment.  She feels somewhat down today but denies sugars being low -She is going to need more support-apparently has nursing coming out to the home soon and likely physical therapy as well-I would certainly endorse these.  Patient with some generalized weakness-I went to the car to help her get into her jeep-essentially required full assist from her son and myself to lift her into the car -She does have some depressed mood from cancer treatment and her illness in general-continue Remeron at bedtime.  Buspirone has helped some with anxiety previously..  Need to consider repeat PHQ-9 at follow-up.  May need to consider SSRI  Recommended follow up: Did not schedule plan follow-up-every 3 to 4 months would be reasonable Future Appointments  Date Time Provider [place]  05/07/2020 11:30 AM Henrene Pastor, [place], LCSW ACP-ACP None  05/21/2020  1:30 PM [facility] LAB 2 [facility] None  05/21/2020  2:00 PM [name], MD [facility] None  05/31/2020  2:45 PM [place],  [place],  MD [facility] None    Lab/Order associations:   ICD-10-CM   1. 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 Essential hypertension  I10     Return precautions advised.  Garret Reddish, MD

## 2020-05-06 NOTE — Telephone Encounter (Signed)
PATIENT ASSISTANCE PROGRAM - SHIPMENT  PRODUCT: Lantus Solostar QUANTITY: 2 boxes, each with 5 pens (TOTAL = 10 pens)  LOCATION OF PRODUCT: Front sample fridge PATIENT NOTIFIED? Yes PATIENT RESPONSE: VM is full and unable to accept incoming messages. My Chart message sent.

## 2020-05-06 NOTE — Patient Instructions (Addendum)
Lets get you back in endocrinology-team please place a referral  My #1 concern right now is to avoid future low blood sugars  Do not take any Novolin tonight.  Give 10 units of Lantus before bed.  Do not give Novolin tomorrow unless blood sugar is above 250 and then you can give 5 units (do not give before bed)  Give 15 units of Lantus tomorrow night.  Write down every blood sugar you take and each time insulin is given and how much.  I do want a morning blood sugar every day and then can very get a blood sugars of the day such as before lunch, before dinner, before bed.  Please call me on Monday or Tuesday with how your numbers are doing.  My goal is to get your morning sugar closer to 150 and an easier regimen for you in place.  If #s get above 350 give [name] a call. im also happy to check in tomorrow. Want to avoid any numbers over 100.   I want to check in with you at least weekly by mychart or phone call at the latest over next month

## 2020-05-07 ENCOUNTER — Other Ambulatory Visit: Payer: Medicare Other

## 2020-05-07 ENCOUNTER — Telehealth: Payer: Self-pay

## 2020-05-07 DIAGNOSIS — Z515 Encounter for palliative care: Secondary | ICD-10-CM

## 2020-05-07 DIAGNOSIS — C50912 Malignant neoplasm of unspecified site of left female breast: Secondary | ICD-10-CM | POA: Diagnosis not present

## 2020-05-07 DIAGNOSIS — J9602 Acute respiratory failure with hypercapnia: Secondary | ICD-10-CM | POA: Diagnosis not present

## 2020-05-07 DIAGNOSIS — J910 Malignant pleural effusion: Secondary | ICD-10-CM | POA: Diagnosis not present

## 2020-05-07 DIAGNOSIS — C782 Secondary malignant neoplasm of pleura: Secondary | ICD-10-CM | POA: Diagnosis not present

## 2020-05-07 DIAGNOSIS — I130 Hypertensive heart and chronic kidney disease with heart failure and stage 1 through stage 4 chronic kidney disease, or unspecified chronic kidney disease: Secondary | ICD-10-CM | POA: Diagnosis not present

## 2020-05-07 DIAGNOSIS — J9601 Acute respiratory failure with hypoxia: Secondary | ICD-10-CM | POA: Diagnosis not present

## 2020-05-07 NOTE — Telephone Encounter (Signed)
Palliative care SW scheduled in home visit with patients son Marta Antu for Fri 05-07-2020 @1130am .

## 2020-05-07 NOTE — Progress Notes (Signed)
COMMUNITY PALLIATIVE CARE SW NOTE  PATIENT NAME: Wendy [name] DOB: [birth year] MRN: [id number]  PRIMARY CARE PROVIDER: Marin Olp, MD  RESPONSIBLE PARTY:  Acct ID - Guarantor Home Phone Work Phone Relationship Acct Type  [id number] [name], [name] [phone]  Self P/F     [address], [place], [place] [address]     PLAN OF CARE and INTERVENTIONS:             1. GOALS OF CARE/ ADVANCE CARE PLANNING:  Patient is a DNR. Most form completed. Son is HCPOA. Patients goal is to remain in home and avoid hospitalizations.   2. SOCIAL/EMOTIONAL/SPIRITUAL ASSESSMENT/ INTERVENTIONS:  SW met with patient and patient's son in the home for scheduled visit. Patient sitting in recliner and dosing off during visit. Patient shred that she slept well the night before but has been napping on and off all morning. Patient had a recent hospitalization on 05-02-2020 due to uncontrolled blood sugars. Patient recently saw PCP who has changed insulin regimen and is monitoring closely. Son and DIL keeping close track of BS reading and insulin y writing down readings and insulin everyday. Patient had pleurex cath drained yesterday by [facility] RN. Patient shared that her breathing feels off today, patient currently on 4LPM, DIL will attempt to drain more fluid later today. SW suggest increasing O2 if patients breathing does not improve. Patient denied pain on today. Patient shared her appetite has not been good due to feeling nauseous. Patient states that chemo pills makes her nauseous. Patient has started new week trial/week of oral chemo (2 weeks on 1 week off) and is to see oncologist for follow up around the end of the month. SW discussed Hospice services with patient and son per their request. Patient stated she is open to Hospice services in the home. Patient shared she wants to have her oncologists f/u first to see if the chemo treatment has been effective, before making a decision on Hospice service. SW encouraged patient  that palliative is here to support whichever decision she chooses. Son to outreach palliative after next oncology appointment to update of their decision. SW discussed goals, reviewed care plan, provided emotional support, used active and reflective listening. Son and patient has no other concerns/questions at this time. Palliative care will continue to follow. 3. PATIENT/CAREGIVER EDUCATION/ COPING:  Patient is A&O x5 and engaged in visit. Patient presented down in spirits today, she shares as a result of her recent hospitalizations and just feeling tired. Patient takes Remeron at bedtime. Patient's family continues to be supportive. 4. PERSONAL EMERGENCY PLAN:  Family to call 911 for emergencies. 5. COMMUNITY RESOURCES COORDINATION/ HEALTH CARE NAVIGATION:  Family assist with managing care. [facility] continues PT/OT/Nursing services. 6. FINANCIAL/LEGAL CONCERNS/INTERVENTIONS:  SW will f/u with DIL on MCD status at next visit.     SOCIAL HX:  Social History   Tobacco Use  . Smoking status: Never Smoker  . Smokeless tobacco: Former Systems developer    Types: Snuff  Substance Use Topics  . Alcohol use: No    Alcohol/week: 0.0 standard drinks    CODE STATUS: DNR  ADVANCED DIRECTIVES: Y MOST FORM COMPLETE: Y HOSPICE EDUCATION PROVIDED: Y  PPS: Patient is able to SPT with RW to [facility]. DIL and son assist with all ADL's. Patient is able to feed self.    Time Spent: 30 mins       Wendy [name], Wendy [name]

## 2020-05-10 ENCOUNTER — Encounter: Payer: Self-pay | Admitting: Family Medicine

## 2020-05-11 DIAGNOSIS — J9601 Acute respiratory failure with hypoxia: Secondary | ICD-10-CM | POA: Diagnosis not present

## 2020-05-11 DIAGNOSIS — J9602 Acute respiratory failure with hypercapnia: Secondary | ICD-10-CM | POA: Diagnosis not present

## 2020-05-11 DIAGNOSIS — J910 Malignant pleural effusion: Secondary | ICD-10-CM | POA: Diagnosis not present

## 2020-05-11 DIAGNOSIS — C50912 Malignant neoplasm of unspecified site of left female breast: Secondary | ICD-10-CM | POA: Diagnosis not present

## 2020-05-11 DIAGNOSIS — C782 Secondary malignant neoplasm of pleura: Secondary | ICD-10-CM | POA: Diagnosis not present

## 2020-05-11 DIAGNOSIS — I130 Hypertensive heart and chronic kidney disease with heart failure and stage 1 through stage 4 chronic kidney disease, or unspecified chronic kidney disease: Secondary | ICD-10-CM | POA: Diagnosis not present

## 2020-05-12 ENCOUNTER — Telehealth: Payer: Self-pay | Admitting: Family Medicine

## 2020-05-12 DIAGNOSIS — Z515 Encounter for palliative care: Secondary | ICD-10-CM

## 2020-05-12 DIAGNOSIS — IMO0002 Reserved for concepts with insufficient information to code with codable children: Secondary | ICD-10-CM

## 2020-05-12 DIAGNOSIS — E113419 Type 2 diabetes mellitus with severe nonproliferative diabetic retinopathy with macular edema, unspecified eye: Secondary | ICD-10-CM

## 2020-05-12 DIAGNOSIS — I130 Hypertensive heart and chronic kidney disease with heart failure and stage 1 through stage 4 chronic kidney disease, or unspecified chronic kidney disease: Secondary | ICD-10-CM | POA: Diagnosis not present

## 2020-05-12 DIAGNOSIS — C50912 Malignant neoplasm of unspecified site of left female breast: Secondary | ICD-10-CM | POA: Diagnosis not present

## 2020-05-12 DIAGNOSIS — J910 Malignant pleural effusion: Secondary | ICD-10-CM | POA: Diagnosis not present

## 2020-05-12 DIAGNOSIS — J9601 Acute respiratory failure with hypoxia: Secondary | ICD-10-CM | POA: Diagnosis not present

## 2020-05-12 DIAGNOSIS — C782 Secondary malignant neoplasm of pleura: Secondary | ICD-10-CM | POA: Diagnosis not present

## 2020-05-12 DIAGNOSIS — J9602 Acute respiratory failure with hypercapnia: Secondary | ICD-10-CM | POA: Diagnosis not present

## 2020-05-12 NOTE — Telephone Encounter (Signed)
Patients daughter states patient is so uncomfortable sleeping in her bed she is sleeping on sofa. She requested for a form to be faxed to [facility] to help patient get a new bed  Fax # [phone]

## 2020-05-13 NOTE — Telephone Encounter (Signed)
Yes thanks 

## 2020-05-13 NOTE — Telephone Encounter (Signed)
Ok to send order for hospital bed?

## 2020-05-15 DIAGNOSIS — J9602 Acute respiratory failure with hypercapnia: Secondary | ICD-10-CM | POA: Diagnosis not present

## 2020-05-15 DIAGNOSIS — E785 Hyperlipidemia, unspecified: Secondary | ICD-10-CM | POA: Diagnosis not present

## 2020-05-15 DIAGNOSIS — Z938 Other artificial opening status: Secondary | ICD-10-CM | POA: Diagnosis not present

## 2020-05-15 DIAGNOSIS — J9601 Acute respiratory failure with hypoxia: Secondary | ICD-10-CM | POA: Diagnosis not present

## 2020-05-15 DIAGNOSIS — I130 Hypertensive heart and chronic kidney disease with heart failure and stage 1 through stage 4 chronic kidney disease, or unspecified chronic kidney disease: Secondary | ICD-10-CM | POA: Diagnosis not present

## 2020-05-15 DIAGNOSIS — J449 Chronic obstructive pulmonary disease, unspecified: Secondary | ICD-10-CM | POA: Diagnosis not present

## 2020-05-15 DIAGNOSIS — N183 Chronic kidney disease, stage 3 unspecified: Secondary | ICD-10-CM | POA: Diagnosis not present

## 2020-05-15 DIAGNOSIS — C50912 Malignant neoplasm of unspecified site of left female breast: Secondary | ICD-10-CM | POA: Diagnosis not present

## 2020-05-15 DIAGNOSIS — E1122 Type 2 diabetes mellitus with diabetic chronic kidney disease: Secondary | ICD-10-CM | POA: Diagnosis not present

## 2020-05-15 DIAGNOSIS — E113419 Type 2 diabetes mellitus with severe nonproliferative diabetic retinopathy with macular edema, unspecified eye: Secondary | ICD-10-CM | POA: Diagnosis not present

## 2020-05-15 DIAGNOSIS — D6481 Anemia due to antineoplastic chemotherapy: Secondary | ICD-10-CM | POA: Diagnosis not present

## 2020-05-15 DIAGNOSIS — Z4682 Encounter for fitting and adjustment of non-vascular catheter: Secondary | ICD-10-CM | POA: Diagnosis not present

## 2020-05-15 DIAGNOSIS — C782 Secondary malignant neoplasm of pleura: Secondary | ICD-10-CM | POA: Diagnosis not present

## 2020-05-15 DIAGNOSIS — Z79899 Other long term (current) drug therapy: Secondary | ICD-10-CM | POA: Diagnosis not present

## 2020-05-15 DIAGNOSIS — I509 Heart failure, unspecified: Secondary | ICD-10-CM | POA: Diagnosis not present

## 2020-05-15 DIAGNOSIS — Z170 Estrogen receptor positive status [ER+]: Secondary | ICD-10-CM | POA: Diagnosis not present

## 2020-05-15 DIAGNOSIS — Z794 Long term (current) use of insulin: Secondary | ICD-10-CM | POA: Diagnosis not present

## 2020-05-15 DIAGNOSIS — Z8701 Personal history of pneumonia (recurrent): Secondary | ICD-10-CM | POA: Diagnosis not present

## 2020-05-15 DIAGNOSIS — K219 Gastro-esophageal reflux disease without esophagitis: Secondary | ICD-10-CM | POA: Diagnosis not present

## 2020-05-15 DIAGNOSIS — Z9981 Dependence on supplemental oxygen: Secondary | ICD-10-CM | POA: Diagnosis not present

## 2020-05-15 DIAGNOSIS — F419 Anxiety disorder, unspecified: Secondary | ICD-10-CM | POA: Diagnosis not present

## 2020-05-15 DIAGNOSIS — F329 Major depressive disorder, single episode, unspecified: Secondary | ICD-10-CM | POA: Diagnosis not present

## 2020-05-15 DIAGNOSIS — J910 Malignant pleural effusion: Secondary | ICD-10-CM | POA: Diagnosis not present

## 2020-05-17 ENCOUNTER — Other Ambulatory Visit: Payer: Self-pay | Admitting: Nurse Practitioner

## 2020-05-17 MED FILL — XELODA 500 MG TABLET: 500 | 21 days supply | Qty: 84 | Fill #1

## 2020-05-17 NOTE — Addendum Note (Signed)
Addended by: Marian Sorrow on: 05/17/2020 02:43 PM   Modules accepted: Orders

## 2020-05-17 NOTE — Telephone Encounter (Signed)
Order for Hospital Bed faxed to [place] at [phone].

## 2020-05-17 NOTE — Telephone Encounter (Signed)
Spoke to pt's daughter [name] told her order for hospital bed has been faxed to [facility] and they should be in touch with you. [name] verbalized understanding.

## 2020-05-18 DIAGNOSIS — J9601 Acute respiratory failure with hypoxia: Secondary | ICD-10-CM | POA: Diagnosis not present

## 2020-05-18 DIAGNOSIS — C782 Secondary malignant neoplasm of pleura: Secondary | ICD-10-CM | POA: Diagnosis not present

## 2020-05-18 DIAGNOSIS — I130 Hypertensive heart and chronic kidney disease with heart failure and stage 1 through stage 4 chronic kidney disease, or unspecified chronic kidney disease: Secondary | ICD-10-CM | POA: Diagnosis not present

## 2020-05-18 DIAGNOSIS — J9602 Acute respiratory failure with hypercapnia: Secondary | ICD-10-CM | POA: Diagnosis not present

## 2020-05-18 DIAGNOSIS — C50912 Malignant neoplasm of unspecified site of left female breast: Secondary | ICD-10-CM | POA: Diagnosis not present

## 2020-05-18 DIAGNOSIS — J910 Malignant pleural effusion: Secondary | ICD-10-CM | POA: Diagnosis not present

## 2020-05-19 ENCOUNTER — Telehealth: Payer: Self-pay

## 2020-05-19 DIAGNOSIS — J9601 Acute respiratory failure with hypoxia: Secondary | ICD-10-CM | POA: Diagnosis not present

## 2020-05-19 DIAGNOSIS — J9602 Acute respiratory failure with hypercapnia: Secondary | ICD-10-CM | POA: Diagnosis not present

## 2020-05-19 DIAGNOSIS — C782 Secondary malignant neoplasm of pleura: Secondary | ICD-10-CM | POA: Diagnosis not present

## 2020-05-19 DIAGNOSIS — I130 Hypertensive heart and chronic kidney disease with heart failure and stage 1 through stage 4 chronic kidney disease, or unspecified chronic kidney disease: Secondary | ICD-10-CM | POA: Diagnosis not present

## 2020-05-19 DIAGNOSIS — C50912 Malignant neoplasm of unspecified site of left female breast: Secondary | ICD-10-CM | POA: Diagnosis not present

## 2020-05-19 DIAGNOSIS — J910 Malignant pleural effusion: Secondary | ICD-10-CM | POA: Diagnosis not present

## 2020-05-19 NOTE — Telephone Encounter (Signed)
We have several boxes of patient assistance insulin waiting for her to pick up for almost a month. Patient has been called and notified to pick up but she has not yet done so.   I have called patient again and left a message that these must be picked up this week or they will be sent back as we do not have the space to continue holding them.

## 2020-05-20 NOTE — Telephone Encounter (Signed)
Medication refill

## 2020-05-21 ENCOUNTER — Inpatient Hospital Stay: Payer: Medicare Other | Admitting: Hematology

## 2020-05-21 ENCOUNTER — Telehealth: Payer: Self-pay | Admitting: Pulmonary Disease

## 2020-05-21 ENCOUNTER — Inpatient Hospital Stay: Payer: Medicare Other

## 2020-05-21 DIAGNOSIS — J9602 Acute respiratory failure with hypercapnia: Secondary | ICD-10-CM | POA: Diagnosis not present

## 2020-05-21 DIAGNOSIS — C782 Secondary malignant neoplasm of pleura: Secondary | ICD-10-CM | POA: Diagnosis not present

## 2020-05-21 DIAGNOSIS — I130 Hypertensive heart and chronic kidney disease with heart failure and stage 1 through stage 4 chronic kidney disease, or unspecified chronic kidney disease: Secondary | ICD-10-CM | POA: Diagnosis not present

## 2020-05-21 DIAGNOSIS — J9601 Acute respiratory failure with hypoxia: Secondary | ICD-10-CM | POA: Diagnosis not present

## 2020-05-21 DIAGNOSIS — C50912 Malignant neoplasm of unspecified site of left female breast: Secondary | ICD-10-CM | POA: Diagnosis not present

## 2020-05-21 DIAGNOSIS — J910 Malignant pleural effusion: Secondary | ICD-10-CM | POA: Diagnosis not present

## 2020-05-21 MED ORDER — ALBUTEROL SULFATE HFA 108 (90 BASE) MCG/ACT IN AERS: 2.0000 | INHALATION_SPRAY | Freq: Four times a day (QID) | RESPIRATORY_TRACT | 2 refills | Status: AC | PRN

## 2020-05-21 NOTE — Telephone Encounter (Signed)
Spoke with pt's daughter (dpr on file, person who made original phone call), requesting Breo 100 samples.  I advised that we do not have any samples in office at this time.  Offered to send rx but daughter declined.  Did request albuterol inhaler refill.  This has been sent as requested.  Nothing further needed at this time- will close encounter.

## 2020-05-24 ENCOUNTER — Inpatient Hospital Stay: Payer: Medicare Other | Admitting: Nurse Practitioner

## 2020-05-24 ENCOUNTER — Inpatient Hospital Stay: Payer: Medicare Other

## 2020-05-24 ENCOUNTER — Telehealth: Payer: Self-pay

## 2020-05-24 DIAGNOSIS — R404 Transient alteration of awareness: Secondary | ICD-10-CM | POA: Diagnosis not present

## 2020-05-24 DIAGNOSIS — I469 Cardiac arrest, cause unspecified: Secondary | ICD-10-CM | POA: Diagnosis not present

## 2020-05-24 DIAGNOSIS — R000 Tachycardia, unspecified: Secondary | ICD-10-CM | POA: Diagnosis not present

## 2020-05-24 DIAGNOSIS — I499 Cardiac arrhythmia, unspecified: Secondary | ICD-10-CM | POA: Diagnosis not present

## 2020-05-31 ENCOUNTER — Ambulatory Visit: Payer: Medicare Other | Admitting: Pulmonary Disease

## 2020-06-02 DIAGNOSIS — 419620001 Death: Secondary | SNOMED CT | POA: Diagnosis not present

## 2020-06-02 NOTE — Telephone Encounter (Signed)
Ms Ibach' daughter in law, Larene Abdulahad Mederos left vm stating that Ms Lupo passed away this am.

## 2020-06-02 NOTE — Progress Notes (Deleted)
Wendy [name]   [phone] Fax:([phone]) [phone]   Clinic Follow up Note   Patient Care Team: Marin Olp, MD as PCP - General (Family Medicine) Philemon Kingdom, MD as Consulting Physician (Internal Medicine) Jalene Mullet, MD as Consulting Physician (Ophthalmology) Erroll Luna, MD as Consulting Physician (General Surgery) Kyung Rudd, MD as Consulting Physician (Radiation Oncology) [name], MD as Consulting Physician (Hematology) Gardenia Phlegm, NP as Nurse Practitioner (Hematology and Oncology) Marshell Garfinkel, MD as Consulting Physician (Pulmonary Disease) 05/23/2020  CHIEF COMPLAINT: F/u metastatic breast cancer   SUMMARY OF ONCOLOGIC HISTORY: Oncology History Overview Note  Cancer Staging Malignant neoplasm of upper-outer quadrant of left breast in female, estrogen receptor positive ([place]) Staging form: Breast, AJCC 8th Edition - Clinical: Stage IIB (cT2, cN0, cM0, G3, ER: Positive, PR: Negative, HER2: Negative) - Signed by [name], MD on 11/23/2016 - Pathologic stage from 12/28/2016: Stage IIB (pT2, pN1a(sn), cM0, G3, ER: Positive, PR: Negative, HER2: Negative) - Signed by [name], MD on 01/02/2017     Malignant neoplasm of upper-outer quadrant of left breast in female, estrogen receptor positive ([place])  11/01/2016 Mammogram   Diagnostic mammogram and ultrasound of left breast and axilla showed a 3.1 x 2.1 x 1.4 cm (3.3 x 2.0 x 2.7 cm by ultrasound) mass in the upper outer quadrant of the anterior third of the left breast, associated with pleomorphic calcification. There is a 8 mm (1.8cm by [name]) prominent lymph node in the left axilla.   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   11/07/2016 Receptors her2   ER 80% positive, PR negative, HER-2 negative, Ki-67 90%   11/20/2016 Initial Diagnosis   Malignant neoplasm of upper-outer  quadrant of left breast in female, estrogen receptor positive ([place])   12/07/2016 Surgery   Left lumpectomy and left axillary sentinel lymph node sampling by Dr. Brantley Stage   12/07/2016 Pathology Results   pT2, pN1 Left Lumpectomy: Grade 3 IDC measuring 3.4 cm, carcinoma broadly present at the superior margin. Grade 3 DCIS. 1 out of 2 left axillary SLN positive for metastatic carcinoma   12/07/2016 Miscellaneous   Mammaprint showed high risk disease, basal type    12/28/2016 Surgery   Re-excision of the previously positive superior margin was negative for malignant cells.    01/04/2017 Surgery   Port inserted   01/18/2017 - 03/22/2017 Chemotherapy   Adjuvant Docetaxel 75 mg/m and Cytoxan 600 mg/m, every 21 days, for total of 4 cycles, with Neulasta on day 2.    04/23/2017 - 05/18/2017 Radiation Therapy   Radiation treatment dates:   04/23/17 - 05/18/17 Administered by Dr. Lisbeth Renshaw  Site/dose:    Left breast/ 42.5 Gy in 17 Fx Boost / 7.5 Gy in 3 Fx   06/12/2017 - 08/14/2019 Anti-estrogen oral therapy   Adjuvant letrozole 1 mg daily, plan for 5-7 years. D/c on 08/14/19 due to local recurrence of left axilla.     07/20/2017 Mammogram   IMPRESSION: No mammographic evidence of malignancy in either breast. 3.8 cm left breast postsurgical loculated seroma.   09/12/2017 Survivorship     09/19/2018 Imaging   Baseline DEXA 09/19/18 ASSESSMENT: The BMD measured at Femur Neck Left is 0.888 g/cm2 with a T-score of -1.1. This patient is considered osteopenic according to [facility]) criteria.   The scan quality is good. L-4 was excluded due to degenerative changes.   Site Region  Measured Date Measured Age YA BMD Significant CHANGE T-score AP Spine  L1-L3      09/19/2018    77.2         -0.7    1.084 g/cm2   DualFemur Neck Left  09/19/2018    77.2         -1.1    0.888 g/cm2   DualFemur Total Mean 09/19/2018    77.2         -0.5    0.946 g/cm2 ASSESSMENT: The probability of a  major osteoporotic fracture is 4.5 % within the next ten years.   The probability of hip fracture is 0.8  % within the next 10 years.   06/25/2019 Mammogram   Diagnostic mammogram 06/25/19  IMPRESSION: 1.  Two morphologically abnormal lymph nodes in the left axilla with a lymph node in the lower left axilla measuring 2.4 x 2 x 2.6 cm.   2. Stable lumpectomy changes left breast with no findings of malignancy in either breast.   07/01/2019 Pathology Results   Diagnosis Lymph node, needle/core biopsy, left axilla - POORLY DIFFERENTIATED CARCINOMA CONSISTENT WITH BREAST PRIMARY. - NO LYMPHOID NODAL TISSUE IDENTIFIED. - SEE MICROSCOPIC DESCRIPTION. Microscopic Comment The carcinoma is consistent with grade III.  PROGNOSTIC INDICATORS Results: IMMUNOHISTOCHEMICAL AND MORPHOMETRIC ANALYSIS PERFORMED MANUALLY The tumor cells are NEGATIVE for Her2 (1+). Estrogen Receptor: 80%, POSITIVE, WEAK STAINING INTENSITY Progesterone Receptor: 0%, NEGATIVE Proliferation Marker Ki67: 80%   07/18/2019 Breast MRI   Breast MRI 07/18/19  IMPRESSION: 1. Two morphologically abnormal level 1 left axillary lymph nodes correlating with biopsy-proven malignancy. 2. No MRI evidence of malignancy in either breast. 3. Postsurgical changes of the left breast consistent with prior lumpectomy.   07/25/2019 PET scan   PET 07/25/19  IMPRESSION: 1. Hypermetabolic LEFT axillary mass consistent with metastatic breast cancer. 2. High LEFT axillary/posterior triangle (level 5) lymph node consistent with local metastasis. 3. No central mediastinal nodal metastasis. No suspicious pulmonary nodules. 4. No distant soft tissue metastasis or skeletal metastasis. 5. Calcified fibroid uterus   08/05/2019 Surgery   LEFT AXILLARY LYMPH NODE DISSECTION by Dr. Brantley Stage 08/05/19    08/05/2019 Pathology Results   FINAL MICROSCOPIC DIAGNOSIS: 08/05/19    A. LYMPH NODES, LEFT AXILLARY, DISSECTION:  - Metastatic carcinoma.    - See comment.    08/2019 - 01/13/2020 Anti-estrogen oral therapy   Exemestane 68m starting 08/2019. Stopped with the start of chemo in 01/2020   09/01/2019 Genetic Testing   [facility] Genomic Findings:  -RET amplification -CDK8 amplification -NOTCH1 deletion  -TSW96splice site [id number]>M  13/84/6659Imaging   CT Angio Chest   IMPRESSION: 1. No definitive pulmonary embolism. 2. Multiple new small bilateral pulmonary nodules consistent with metastatic disease. 3. Soft tissue density in the left axilla at the site of node dissection, probably representing scarring but I cannot exclude tumor recurrence. 4. New partially healed fracture of the posterior aspect of the right eighth rib.   12/04/2019 - 01/13/2020 Chemotherapy   Verzenio 1528mBID starting 12/04/19. Stopped a week before the start of chemo in 01/2020   01/07/2020 PET scan   IMPRESSION: 1. Marked worsening of disease with soft tissue recurrence in the left axilla, new and enlarging lymph nodes in the supraclavicular/high axillary region, new mediastinal adenopathy and new pulmonary pleural and parenchymal disease. 2. Metastatic disease to the right hepatic lobe. 3. Signs of bony metastasis. 4. New pleural effusions presumably malignant and associated with pleural nodularity.   01/20/2020 - 02/23/2020  Chemotherapy   Abraxane on days 1, 8, 15 q28 days; started 01/20/20. Stopped after 02/23/20 due to disease progression.    02/24/2020 Procedure   Left thoracentesis  IMPRESSION: Successful ultrasound guided left thoracentesis yielding 320 ml of pleural fluid.    FINAL MICROSCOPIC DIAGNOSIS:  - Malignant cells consistent with metastatic adenocarcinoma     03/02/2020 Imaging   CT Angio Chest  IMPRESSION: No evidence of pulmonary emboli.   Progression of disease particularly within the lungs, hila and mediastinum consistent with the known history of breast carcinoma with recurrence. Persistent left breast mass is  noted with increasing left axillary and left chest wall lymphadenopathy.   Bilateral pleural effusions right greater than left.   Bony metastatic disease.   03/02/2020 Imaging   CT AP  IMPRESSION: 1. No acute abdominal/pelvic findings or lymphadenopathy. No findings for small bowel obstruction. 2. 3 cm segment 6 liver lesion consistent with metastatic disease. 3. Enlarged fibroid uterus with calcified fibroids. 4. Bilateral pleural effusions and numerous small metastatic pulmonary nodules.   Aortic Atherosclerosis (ICD10-I70.0).   03/02/2020 Procedure   Right Thoracentesis  IMPRESSION: Successful ultrasound guided right thoracentesis yielding 300 cc of pleural fluid.    FINAL MICROSCOPIC DIAGNOSIS:  - Malignant cells consistent with adenocarcinoma  - See comment  The tumor cells are EQUIVOCAL for Her2 (2+);  FISH is pending and will  be reported in an addendum   Estrogen Receptor: NEGATIVE  Progesterone Receptor: NEGATIVE  Proliferation Marker Ki-67: 60%    03/19/2020 -  Chemotherapy   Xeloda 1500 mg twice daily on days 1-14 q. 21 days on 03/19/2020. C2 postponed until 04/19/20 for only 1 week. C3 start 05/03/20     CURRENT THERAPY:  Xeloda 1500 mg twice daily on days 1-14 q. 21 days on 03/19/2020. C2 postponed until 04/19/20 for only 1 week. C3 start 8/2.   INTERVAL HISTORY: Ms. Goodenow returns for f/u as scheduled. She was last seen by Dr. Burr Medico on 04/29/20. She had another hypoglycemic event with confusion and BG 30 on 05/02/20, was brought to ED.  She began cycle 3 Xeloda on 05/03/20. She has been in close contact with PCP.    REVIEW OF SYSTEMS:   Constitutional: Denies fevers, chills or abnormal weight loss Eyes: Denies blurriness of vision Ears, nose, mouth, throat, and face: Denies mucositis or sore throat Respiratory: Denies cough, dyspnea or wheezes Cardiovascular: Denies palpitation, chest discomfort or lower extremity swelling Gastrointestinal:  Denies nausea, heartburn  or change in bowel habits Skin: Denies abnormal skin rashes Lymphatics: Denies new lymphadenopathy or easy bruising Neurological:Denies numbness, tingling or new weaknesses Behavioral/Psych: Mood is stable, no new changes  All other systems were reviewed with the patient and are negative.  MEDICAL HISTORY:  Past Medical History:  Diagnosis Date  . Abnormal breast finding 2018   per pt/ having  a lot drainage from left breast nipple  . Anemia   . Anxiety   . Breast cancer ([place]) 11/2016   left/  . CKD (chronic kidney disease)   . Depression   . GERD (gastroesophageal reflux disease)    TUMS as needed  . HTN (hypertension)    states BP has been high recently; has been on med. x 20 yr.  . Hyperlipidemia   . Hyperplastic colon polyp   . HYPERTENSION, BENIGN SYSTEMIC 11/29/2006   Lisinopril hctz 10-12.2m, amlodipine 138m coreg 2519mID, clonidine 0.3 mg BID.   . IMarland Kitchensulin dependent diabetes mellitus   . Personal history of chemotherapy  2018/finished 6 weeks of chemo in Sep 2018  . Personal history of radiation therapy    2018 left breast/finished radiation in Sept 2018 per pt.    SURGICAL HISTORY: Past Surgical History:  Procedure Laterality Date  . AXILLARY LYMPH NODE DISSECTION Left 08/05/2019   Procedure: LEFT AXILLARY LYMPH NODE DISSECTION;  Surgeon: Erroll Luna, MD;  Location: [place];  Service: General;  Laterality: Left;  . BREAST LUMPECTOMY Left 05/05/2008  . BREAST LUMPECTOMY Left 12/07/2016   malignant  . BREAST LUMPECTOMY WITH RADIOACTIVE SEED AND SENTINEL LYMPH NODE BIOPSY Left 12/07/2016   Procedure: LEFT BREAST LUMPECTOMY WITH RADIOACTIVE SEED AND SENTINEL LYMPH NODE BIOPSY;  Surgeon: Erroll Luna, MD;  Location: [place];  Service: General;  Laterality: Left;  . IR FLUORO GUIDE PORT INSERTION RIGHT  01/04/2017  . IR PERC PLEURAL DRAIN W/INDWELL CATH W/IMG GUIDE  04/12/2020  . IR THORACENTESIS ASP PLEURAL SPACE W/IMG GUIDE   03/02/2020  . IR US GUIDE VASC ACCESS RIGHT  01/04/2017  . PORT-A-CATH REMOVAL N/A 05/30/2017   Procedure: REMOVAL PORT-A-CATH;  Surgeon: Erroll Luna, MD;  Location: [place];  Service: General;  Laterality: N/A;  . RE-EXCISION OF BREAST LUMPECTOMY Left 12/28/2016   Procedure: RE-EXCISION OF BREAST LUMPECTOMY;  Surgeon: Erroll Luna, MD;  Location: [place];  Service: General;  Laterality: Left;    I have reviewed the social history and family history with the patient and they are unchanged from previous note.  ALLERGIES:  has No Known Allergies.  MEDICATIONS:  Current Outpatient Medications  Medication Sig Dispense Refill  . albuterol (VENTOLIN HFA) 108 (90 Base) MCG/ACT inhaler Inhale 2 puffs into the lungs every 6 (six) hours as needed for wheezing or shortness of breath. 18 g 2  . amLODipine (NORVASC) 10 MG tablet TAKE 1 TABLET BY MOUTH EVERY DAY (Patient taking differently: Take 10 mg by mouth daily. ) 90 tablet 1  . B-D INS SYRINGE 0.5CC/31GX5/16 31G X 5/16" 0.5 ML MISC USE AS DIRECTED 4 TIMES A DAY 300 each 1  . benzonatate (TESSALON PERLES) 100 MG capsule Take 1 capsule (100 mg total) by mouth 3 (three) times daily as needed for cough. 30 capsule 2  . busPIRone (BUSPAR) 10 MG tablet Take 1 tablet (10 mg total) by mouth 3 (three) times daily. 270 tablet 1  . carvedilol (COREG) 25 MG tablet Take 1 tablet (25 mg total) by mouth 2 (two) times daily with a meal. 180 tablet 3  . cloNIDine (CATAPRES) 0.3 MG tablet TAKE 1 TABLET (0.3 MG TOTAL) BY MOUTH 2 (TWO) TIMES DAILY. 180 tablet 1  . docusate sodium (COLACE) 100 MG capsule Take 1 capsule (100 mg total) by mouth 2 (two) times daily. 10 capsule 0  . famotidine (PEPCID) 20 MG tablet TAKE 1 TABLET BY MOUTH EVERY DAY (Patient taking differently: Take 20 mg by mouth daily. ) 90 tablet 1  . fluticasone (FLONASE) 50 MCG/ACT nasal spray PLACE 1 SPRAY INTO BOTH NOSTRILS DAILY. 48 mL 3  . fluticasone furoate-vilanterol (BREO ELLIPTA)  100-25 MCG/INH AEPB Inhale 1 puff into the lungs daily. 14 each 0  . furosemide (LASIX) 20 MG tablet Take 20 mg by mouth daily.    . insulin glargine (LANTUS SOLOSTAR) 100 UNIT/ML Solostar Pen Inject 30 Units into the skin daily. Inject 32 units into the skin in am (Patient taking differently: Inject 32 Units into the skin in the morning. ) 10 pen 3  . Insulin Pen Needle 31G  X 5 MM MISC Use pen needles for insulin injection daily 100 each 3  . insulin regular (NOVOLIN R) 100 units/mL injection Inject 0.02-0.03 mLs (2-3 Units total) into the skin 3 (three) times daily before meals. If BG persistently greater than 250 on QID accuchecks at home. Can use as Sliding scale insulin as well 20 mL 11  . Insulin Syringe-Needle U-100 (B-D INS SYRINGE 0.5CC/30GX1/2") 30G X 1/2" 0.5 ML MISC USE AS DIRECTED 4 TIMES A DAY 300 each 3  . lisinopril (ZESTRIL) 5 MG tablet Take 1 tablet (5 mg total) by mouth daily. 30 tablet 11  . lovastatin (MEVACOR) 40 MG tablet TAKE 1 TABLET BY MOUTH EVERYDAY AT BEDTIME (Patient taking differently: Take 40 mg by mouth at bedtime. ) 90 tablet 3  . mirtazapine (REMERON) 7.5 MG tablet Take 1 tablet (7.5 mg total) by mouth at bedtime. 30 tablet 2  . montelukast (SINGULAIR) 10 MG tablet Take 1 tablet (10 mg total) by mouth at bedtime. 30 tablet 3  . ondansetron (ZOFRAN) 8 MG tablet Take 1 tablet (8 mg total) by mouth 2 (two) times daily as needed (Nausea or vomiting). 30 tablet 1  . oxyCODONE (OXY IR/ROXICODONE) 5 MG immediate release tablet Take 1 tablet (5 mg total) by mouth every 6 (six) hours as needed for moderate pain or severe pain. Anticipate ongoing need for pain in cancer patient with pleurx catheter- im refilling early as Im out of town next week. 20 tablet 0  . polyethylene glycol powder (GLYCOLAX/MIRALAX) 17 GM/SCOOP powder Take 17 g by mouth daily. (Patient taking differently: Take 17 g by mouth as needed for mild constipation or moderate constipation. ) 1 g 0  .  prochlorperazine (COMPAZINE) 10 MG tablet Take 1 tablet (10 mg total) by mouth every 6 (six) hours as needed for nausea or vomiting. 30 tablet 1  . traMADol (ULTRAM) 50 MG tablet Take 50 mg by mouth every 6 (six) hours as needed for moderate pain.    . XELODA 500 MG tablet Take 3 tablets (1,500 mg total) by mouth 2 (two) times daily after a meal. Take for 14 days, then hold for 7 days. Repeat every 21 days. 84 tablet 1   No current facility-administered medications for this visit.    PHYSICAL EXAMINATION: ECOG PERFORMANCE STATUS: {CHL ONC ECOG PS:[phone]}  There were no vitals filed for this visit. There were no vitals filed for this visit.  GENERAL:alert, no distress and comfortable SKIN: skin color, texture, turgor are normal, no rashes or significant lesions EYES: normal, Conjunctiva are pink and non-injected, sclera clear OROPHARYNX:no exudate, no erythema and lips, buccal mucosa, and tongue normal  NECK: supple, thyroid normal size, non-tender, without nodularity LYMPH:  no palpable lymphadenopathy in the cervical, axillary or inguinal LUNGS: clear to auscultation and percussion with normal breathing effort HEART: regular rate & rhythm and no murmurs and no lower extremity edema ABDOMEN:abdomen soft, non-tender and normal bowel sounds Musculoskeletal:no cyanosis of digits and no clubbing  NEURO: alert & oriented x 3 with fluent speech, no focal motor/sensory deficits  LABORATORY DATA:  I have reviewed the data as listed CBC Latest Ref Rng & Units 05/04/2020 04/29/2020 04/26/2020  WBC 4.0 - 10.5 K/uL 7.8 7.3 13.3(H)  Hemoglobin 12.0 - 15.0 g/dL 7.6(L) 7.7(L) 9.3(L)  Hematocrit 36 - 46 % 23.3(L) 23.2(L) 28.3(L)  Platelets 150 - 400 K/uL 280 272 236     CMP Latest Ref Rng & Units 05/04/2020 04/29/2020 04/26/2020  Glucose 70 - 99 mg/dL  141(H) 108(H) 159(H)  BUN 8 - 23 mg/dL 11 13 23   Creatinine 0.44 - 1.00 mg/dL 1.14(H) 1.17(H) 1.65(H)  Sodium 135 - 145 mmol/L 137 138 135    Potassium 3.5 - 5.1 mmol/L 4.6 4.5 4.4  Chloride 98 - 111 mmol/L 96(L) 98 92(L)  CO2 22 - 32 mmol/L 32 31 30  Calcium 8.9 - 10.3 mg/dL 10.0 9.4 8.9  Total Protein 6.5 - 8.1 g/dL 6.2(L) 6.0(L) 7.1  Total Bilirubin 0.3 - 1.2 mg/dL 0.3 0.3 0.4  Alkaline Phos 38 - 126 U/L 135(H) 130(H) 115  AST 15 - 41 U/L 15 18 16   ALT 0 - 44 U/L <6 <6 8      RADIOGRAPHIC STUDIES: I have personally reviewed the radiological images as listed and agreed with the findings in the report. No results found.   ASSESSMENT & PLAN:  No problem-specific Assessment & Plan notes found for this encounter.   No orders of the defined types were placed in this encounter.  All questions were answered. The patient knows to call the clinic with any problems, questions or concerns. No barriers to learning was detected. I spent {CHL ONC TIME VISIT - HOOIL:[id number]} counseling the patient face to face. The total time spent in the appointment was {CHL ONC TIME VISIT - RVIFB:[id number]} and more than 50% was on counseling and review of test results     Wendy Feeling, NP 05/23/20

## 2020-06-02 DEATH — deceased

## 2020-11-07 IMAGING — DX DG CHEST 1V PORT
1 series · 1 of 1 positions shown · non-contrast
Comparison: 01/26/2020

CLINICAL DATA: Post left thoracentesis

EXAM:
PORTABLE CHEST 1 VIEW

[chest pa]
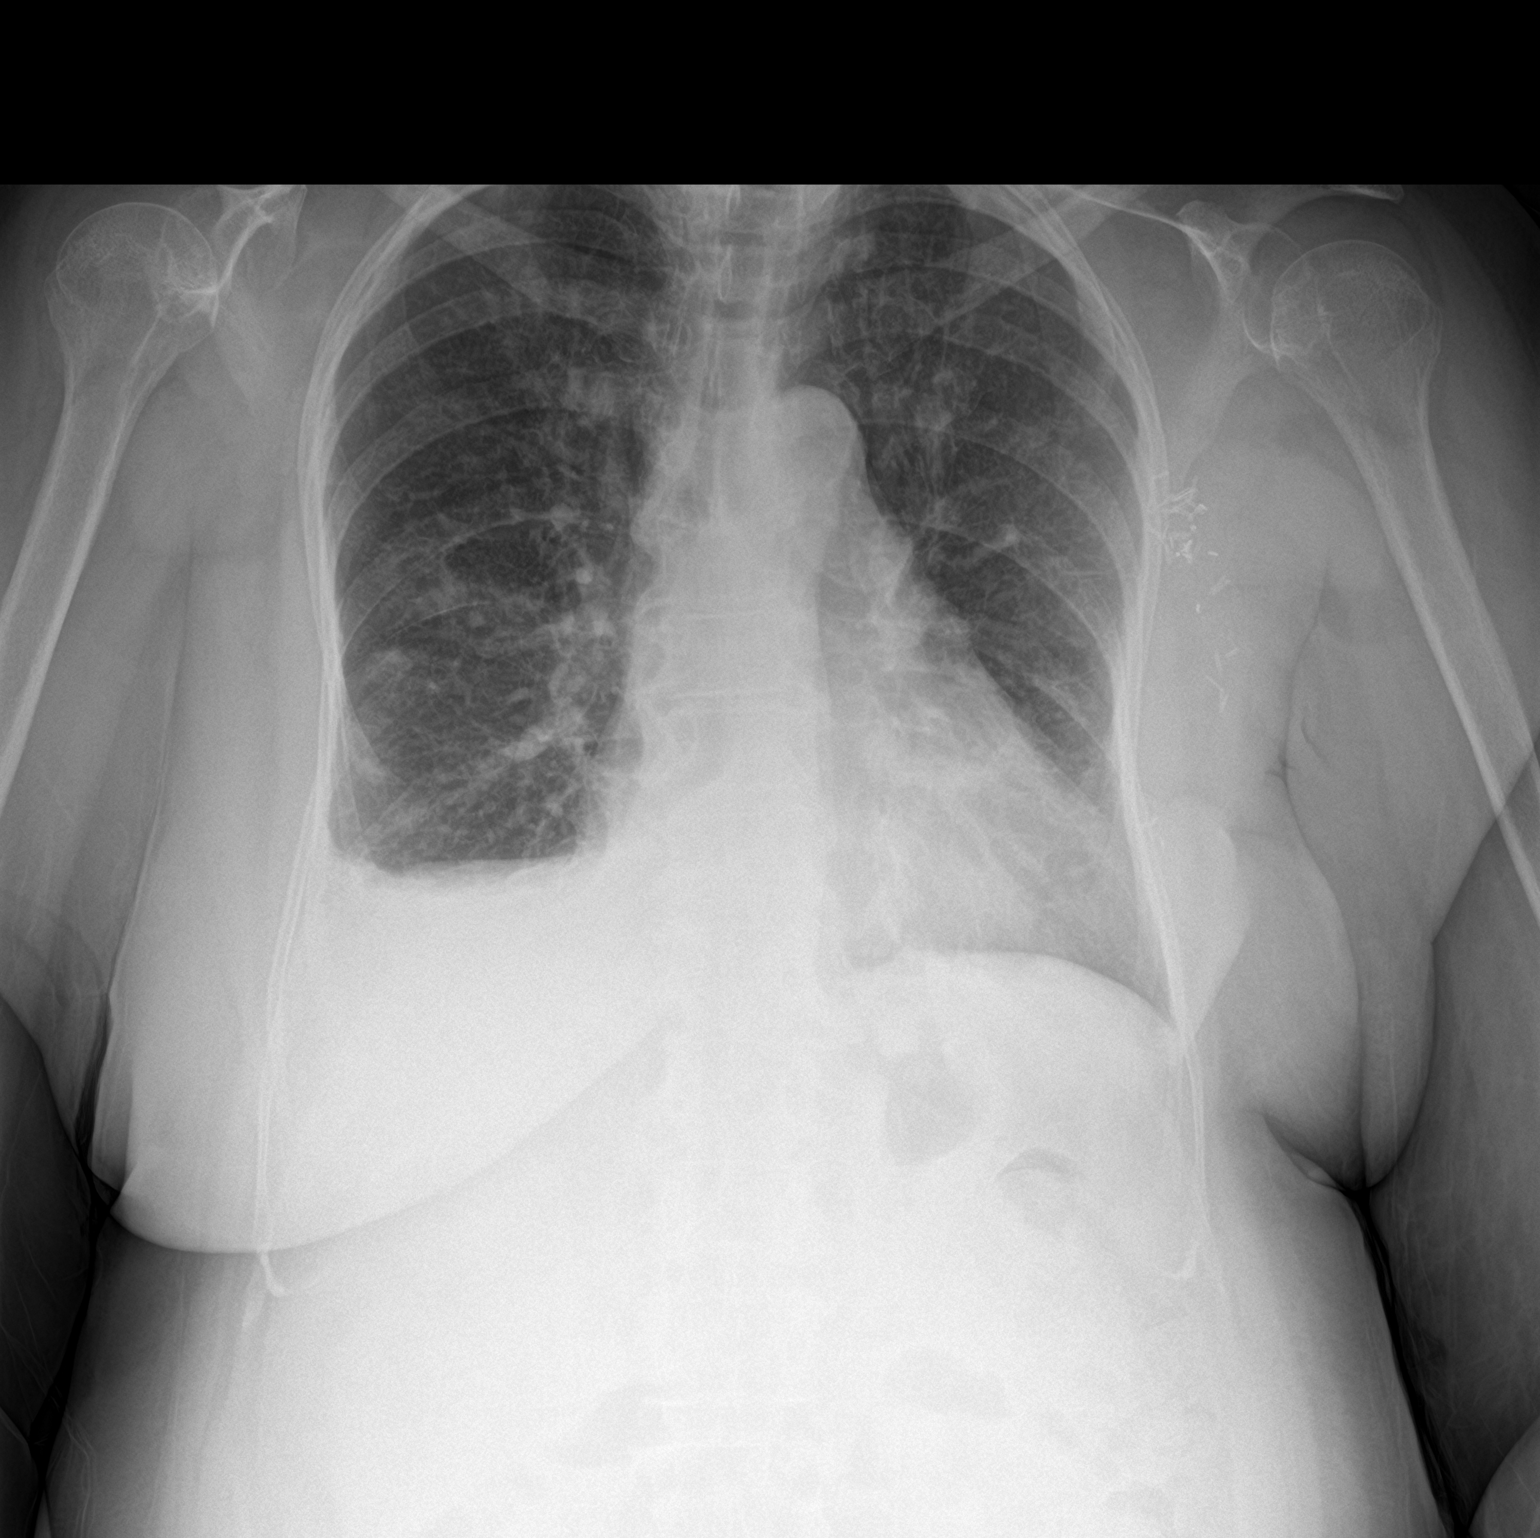

[1 of 1 positions shown; findings below may reference images not displayed]

FINDINGS: No residual pleural effusion on the left. No complication or
pneumothorax

Small right pleural effusion slightly increased from the prior
study. Mild right lower lobe atelectasis. Negative for heart failure
or edema

Left mastectomy with clips in the left axilla
IMPRESSION: No significant left pleural effusion following thoracentesis. No
pneumothorax

Progression of small right effusion and right lower lobe atelectasis
since 01/26/2020.

## 2020-11-07 IMAGING — US US THORACENTESIS ASP PLEURAL SPACE W/IMG GUIDE
1 series · 5 of 5 positions shown · non-contrast
Comparison: none

INDICATION: Patient with a history of breast cancer with metastasis to the
lungs. Interventional radiology asked to perform a therapeutic and
diagnostic thoracentesis.

[Series 1: us thoracentesis asp pleural space w/img guide · 5 of 5 slices shown]
[im 1/5]
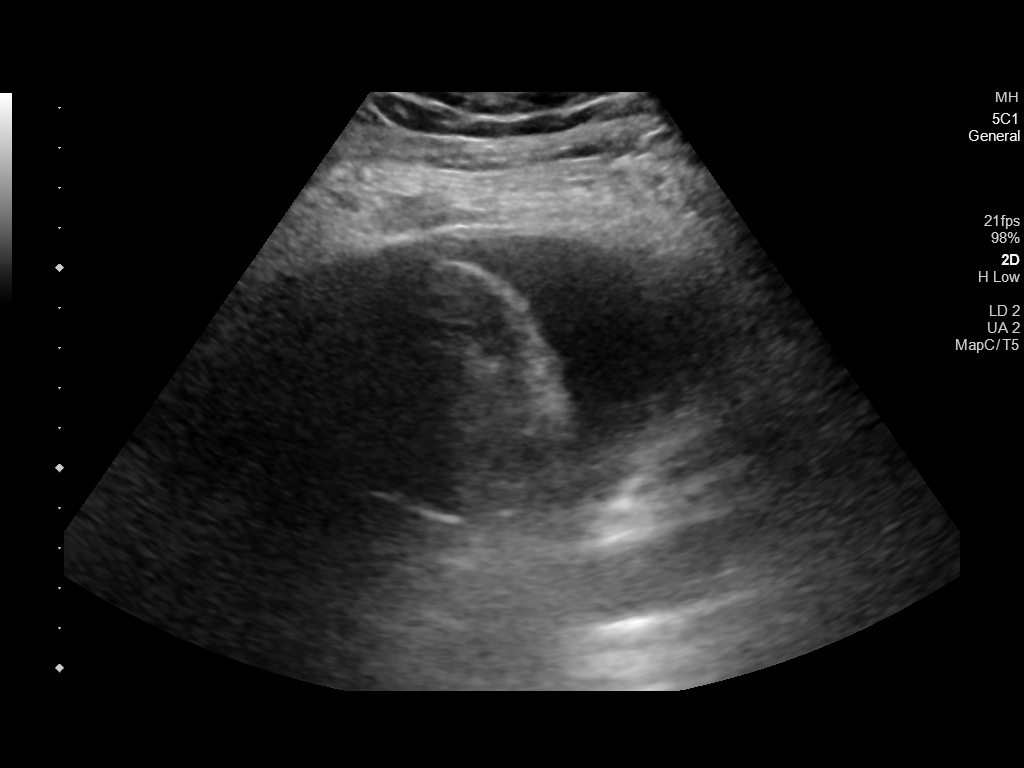
[im 2/5]
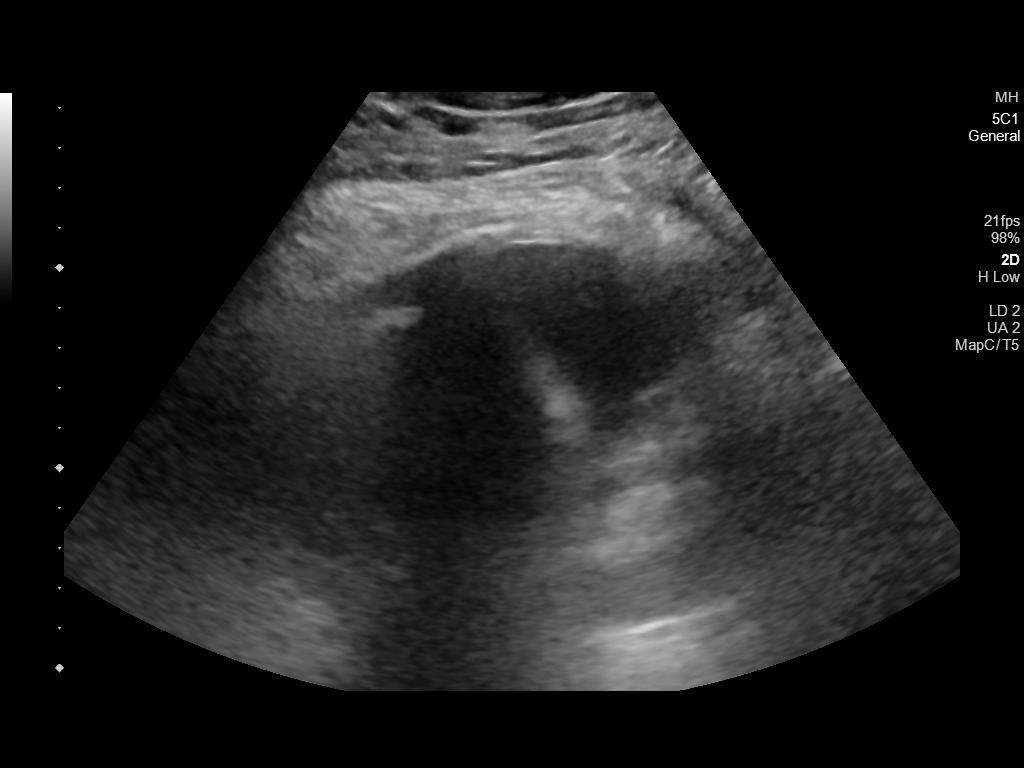
[im 3/5]
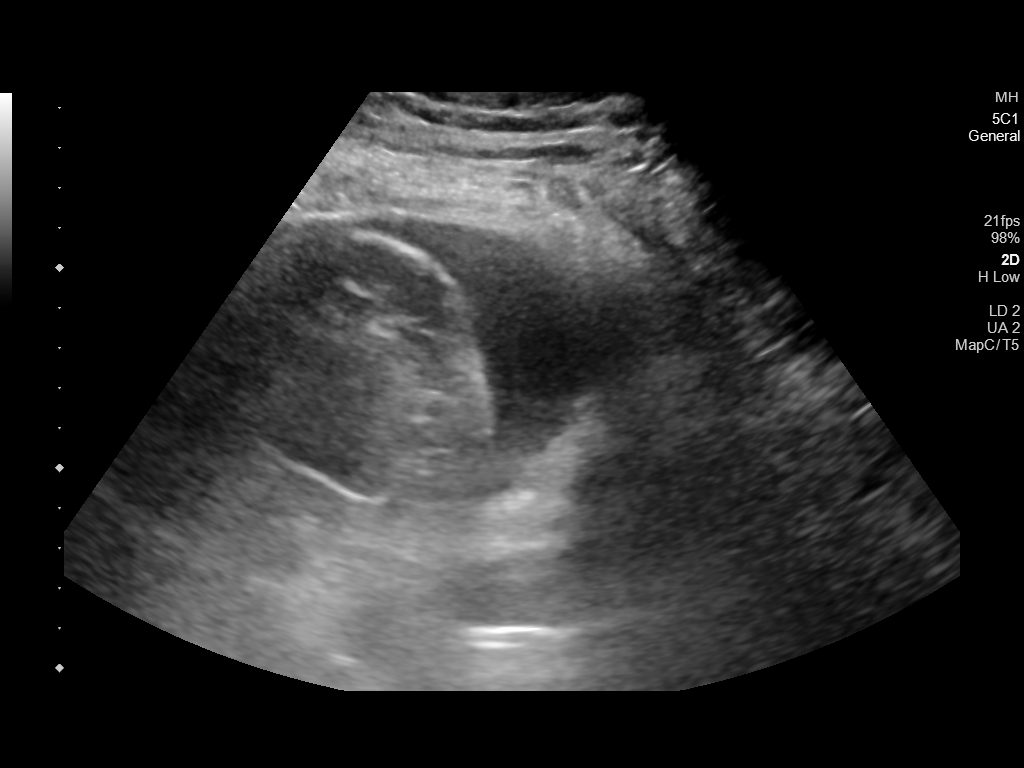
[im 4/5]
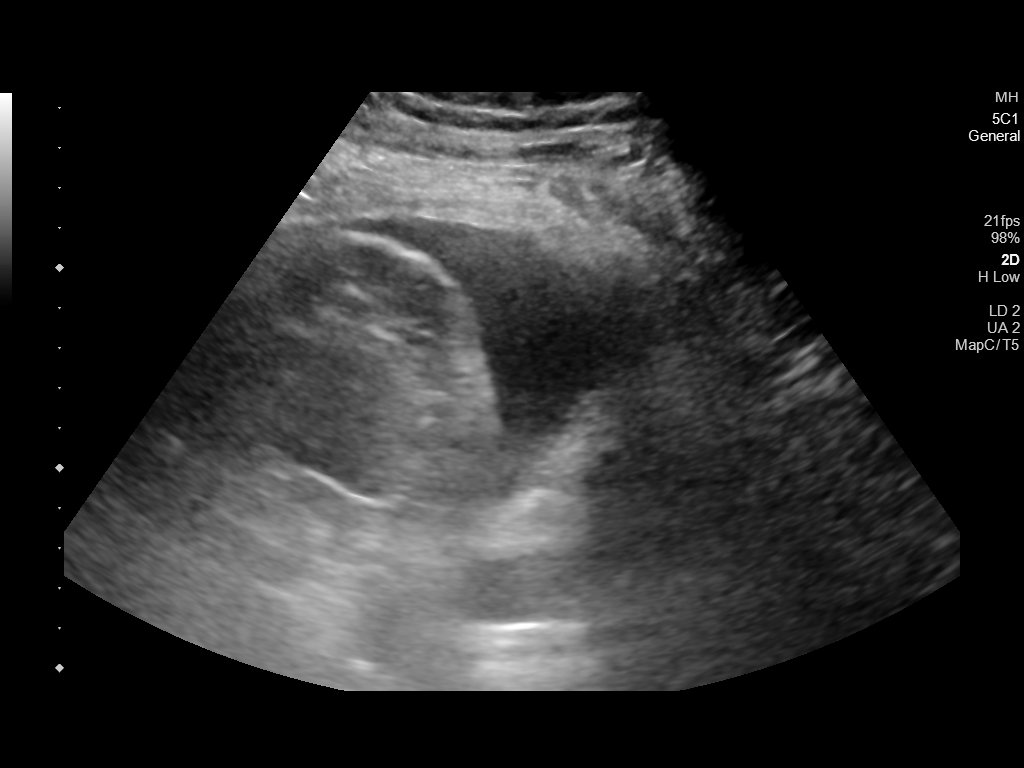
[im 5/5]
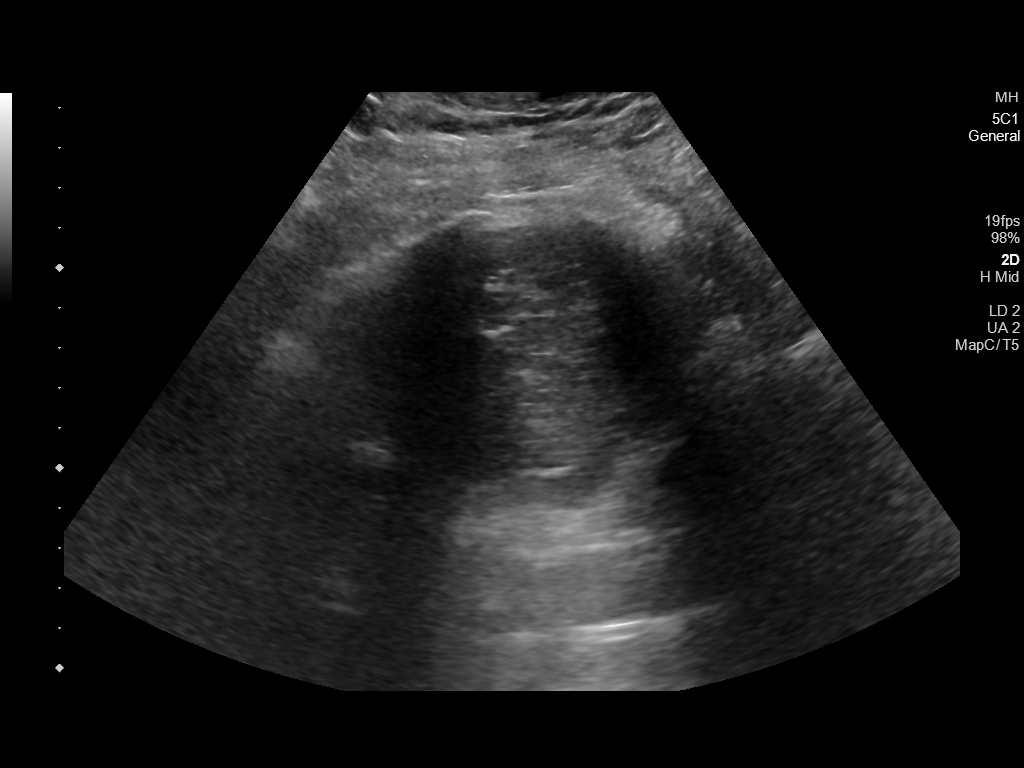

[5 of 5 positions shown; findings below may reference images not displayed]

EXAM:
ULTRASOUND GUIDED LEFT THORACENTESIS

MEDICATIONS:
1% lidocaine 10 mL

COMPLICATIONS:
None immediate.

PROCEDURE:
An ultrasound guided thoracentesis was thoroughly discussed with the
patient and questions answered. The benefits, risks, alternatives
and complications were also discussed. The patient understands and
wishes to proceed with the procedure. Written consent was obtained.

Ultrasound was performed to localize and mark an adequate pocket of
fluid in the left chest. The area was then prepped and draped in the
normal sterile fashion. 1% Lidocaine was used for local anesthesia.
Under ultrasound guidance a 6 Fr Safe-T-Centesis catheter was
introduced. Thoracentesis was performed. The catheter was removed
and a dressing applied.
FINDINGS: A total of approximately 320 ml of amber fluid was removed. Samples
were sent to the laboratory as requested by the clinical team.
IMPRESSION: Successful ultrasound guided left thoracentesis yielding 320 ml of
pleural fluid.

Read by: Ameerah Westberg, NP

## 2020-12-28 ENCOUNTER — Other Ambulatory Visit (HOSPITAL_COMMUNITY): Payer: Self-pay

## 2021-05-28 NOTE — Progress Notes (Signed)
Open in error
